# Patient Record
Sex: Male | Born: 1960 | Race: Black or African American | Hispanic: No | Smoking: Current every day smoker
Health system: Southern US, Community
[De-identification: ages and names within clinical notes are randomized; demographics above are authoritative.]

## PROBLEM LIST (undated history)

## (undated) DIAGNOSIS — F1490 Cocaine use, unspecified, uncomplicated: Secondary | ICD-10-CM

## (undated) DIAGNOSIS — G4733 Obstructive sleep apnea (adult) (pediatric): Secondary | ICD-10-CM

## (undated) DIAGNOSIS — I2510 Atherosclerotic heart disease of native coronary artery without angina pectoris: Secondary | ICD-10-CM

## (undated) DIAGNOSIS — E7800 Pure hypercholesterolemia, unspecified: Secondary | ICD-10-CM

## (undated) DIAGNOSIS — R600 Localized edema: Secondary | ICD-10-CM

## (undated) DIAGNOSIS — R0789 Other chest pain: Secondary | ICD-10-CM

## (undated) DIAGNOSIS — R001 Bradycardia, unspecified: Secondary | ICD-10-CM

## (undated) DIAGNOSIS — Z9981 Dependence on supplemental oxygen: Secondary | ICD-10-CM

## (undated) DIAGNOSIS — Z720 Tobacco use: Secondary | ICD-10-CM

## (undated) DIAGNOSIS — E785 Hyperlipidemia, unspecified: Secondary | ICD-10-CM

## (undated) DIAGNOSIS — G629 Polyneuropathy, unspecified: Secondary | ICD-10-CM

## (undated) DIAGNOSIS — R825 Elevated urine levels of drugs, medicaments and biological substances: Secondary | ICD-10-CM

## (undated) DIAGNOSIS — R1013 Epigastric pain: Secondary | ICD-10-CM

## (undated) DIAGNOSIS — J209 Acute bronchitis, unspecified: Secondary | ICD-10-CM

## (undated) DIAGNOSIS — I43 Cardiomyopathy in diseases classified elsewhere: Secondary | ICD-10-CM

## (undated) DIAGNOSIS — I10 Essential (primary) hypertension: Secondary | ICD-10-CM

## (undated) DIAGNOSIS — I5032 Chronic diastolic (congestive) heart failure: Secondary | ICD-10-CM

## (undated) DIAGNOSIS — Z9289 Personal history of other medical treatment: Secondary | ICD-10-CM

## (undated) DIAGNOSIS — M1990 Unspecified osteoarthritis, unspecified site: Secondary | ICD-10-CM

## (undated) DIAGNOSIS — B349 Viral infection, unspecified: Secondary | ICD-10-CM

## (undated) DIAGNOSIS — E854 Organ-limited amyloidosis: Secondary | ICD-10-CM

## (undated) DIAGNOSIS — I2699 Other pulmonary embolism without acute cor pulmonale: Secondary | ICD-10-CM

## (undated) DIAGNOSIS — D66 Hereditary factor VIII deficiency: Secondary | ICD-10-CM

## (undated) HISTORY — DX: Atherosclerotic heart disease of native coronary artery without angina pectoris: I25.10

## (undated) HISTORY — DX: Chronic diastolic (congestive) heart failure: I50.32

## (undated) HISTORY — DX: Tobacco use: Z72.0

## (undated) HISTORY — PX: SHOULDER SURGERY: SHX246

## (undated) HISTORY — DX: Organ-limited amyloidosis: I43

## (undated) HISTORY — PX: TRANSURETHRAL RESECTION OF PROSTATE: SHX73

## (undated) HISTORY — DX: Organ-limited amyloidosis: E85.4

## (undated) HISTORY — DX: Bradycardia, unspecified: R00.1

## (undated) HISTORY — DX: Cocaine use, unspecified, uncomplicated: F14.90

## (undated) HISTORY — DX: Localized edema: R60.0

---
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
## 2015-12-26 ENCOUNTER — Emergency Department (HOSPITAL_COMMUNITY): Payer: Medicaid Other

## 2015-12-26 ENCOUNTER — Encounter (HOSPITAL_COMMUNITY): Payer: Self-pay

## 2015-12-26 ENCOUNTER — Observation Stay (HOSPITAL_COMMUNITY)
Admission: EM | Admit: 2015-12-26 | Discharge: 2015-12-29 | Disposition: A | Discharge status: Home / Self Care | Payer: Medicaid Other | Attending: Family Medicine | Admitting: Family Medicine

## 2015-12-26 DIAGNOSIS — F17210 Nicotine dependence, cigarettes, uncomplicated: Secondary | ICD-10-CM | POA: Diagnosis not present

## 2015-12-26 DIAGNOSIS — R062 Wheezing: Secondary | ICD-10-CM | POA: Diagnosis not present

## 2015-12-26 DIAGNOSIS — R0789 Other chest pain: Secondary | ICD-10-CM | POA: Diagnosis not present

## 2015-12-26 DIAGNOSIS — B349 Viral infection, unspecified: Secondary | ICD-10-CM

## 2015-12-26 DIAGNOSIS — G629 Polyneuropathy, unspecified: Secondary | ICD-10-CM

## 2015-12-26 DIAGNOSIS — I10 Essential (primary) hypertension: Secondary | ICD-10-CM | POA: Diagnosis present

## 2015-12-26 DIAGNOSIS — J209 Acute bronchitis, unspecified: Secondary | ICD-10-CM

## 2015-12-26 HISTORY — DX: Essential (primary) hypertension: I10

## 2015-12-26 HISTORY — DX: Polyneuropathy, unspecified: G62.9

## 2015-12-26 HISTORY — DX: Other pulmonary embolism without acute cor pulmonale: I26.99

## 2015-12-26 LAB — CBC
HCT: 43.4 % (ref 39.0–52.0)
Hemoglobin: 15 g/dL (ref 13.0–17.0)
MCH: 29.3 pg (ref 26.0–34.0)
MCHC: 34.6 g/dL (ref 30.0–36.0)
MCV: 84.8 fL (ref 78.0–100.0)
PLATELETS: 195 10*3/uL (ref 150–400)
RBC: 5.12 MIL/uL (ref 4.22–5.81)
RDW: 14.3 % (ref 11.5–15.5)
WBC: 6.9 10*3/uL (ref 4.0–10.5)

## 2015-12-26 LAB — BASIC METABOLIC PANEL
Anion gap: 11 (ref 5–15)
BUN: 8 mg/dL (ref 6–20)
CHLORIDE: 100 mmol/L — AB (ref 101–111)
CO2: 25 mmol/L (ref 22–32)
CREATININE: 1.04 mg/dL (ref 0.61–1.24)
Calcium: 9.1 mg/dL (ref 8.9–10.3)
GFR calc Af Amer: >60 mL/min (ref >60)
GFR calc non Af Amer: >60 mL/min (ref >60)
Glucose, Bld: 101 mg/dL — ABNORMAL HIGH (ref 65–99)
Potassium: 3.3 mmol/L — ABNORMAL LOW (ref 3.5–5.1)
SODIUM: 136 mmol/L (ref 135–145)

## 2015-12-26 LAB — PROTIME-INR
INR: 1.02
PROTHROMBIN TIME: 13.4 s (ref 11.4–15.2)

## 2015-12-26 LAB — I-STAT TROPONIN, ED
Troponin i, poc: 0.03 ng/mL (ref 0.00–0.08)
Troponin i, poc: 0.03 ng/mL (ref 0.00–0.08)

## 2015-12-26 LAB — BRAIN NATRIURETIC PEPTIDE: B Natriuretic Peptide: 78.6 pg/mL (ref 0.0–100.0)

## 2015-12-26 MED ORDER — IPRATROPIUM-ALBUTEROL 0.5-2.5 (3) MG/3ML IN SOLN
3.0000 mL | Freq: Once | RESPIRATORY_TRACT | Status: AC
  Administered 2015-12-26: 3 mL via RESPIRATORY_TRACT
  Filled 2015-12-26: qty 3

## 2015-12-26 MED ORDER — ALBUTEROL SULFATE HFA 108 (90 BASE) MCG/ACT IN AERS: 2.0000 | INHALATION_SPRAY | RESPIRATORY_TRACT | 0 refills | Status: DC | PRN

## 2015-12-26 MED ORDER — DOXYCYCLINE HYCLATE 100 MG PO CAPS: 100.0000 mg | ORAL_CAPSULE | Freq: Two times a day (BID) | ORAL | 0 refills | Status: DC

## 2015-12-26 MED ORDER — METHYLPREDNISOLONE SODIUM SUCC 125 MG IJ SOLR
125.0000 mg | Freq: Once | INTRAMUSCULAR | Status: AC
  Administered 2015-12-26: 125 mg via INTRAVENOUS
  Filled 2015-12-26: qty 2

## 2015-12-26 NOTE — ED Provider Notes (Addendum)
[place] DEPT Provider Note  CSN: [id number] Arrival date & time: 12/26/15  1834   History   Chief Complaint Chief Complaint  Patient presents with  . Chest Pain  . Shortness of Breath   HPI Russell Collier is a 55 y.o. male.   Shortness of Breath  Associated symptoms include a fever, headaches, rhinorrhea, cough, sputum production, chest pain and vomiting. Pertinent negatives include no hemoptysis and no abdominal pain.  Illness  This is a new problem. The current episode started 2 days ago. The problem occurs constantly. The problem has been gradually worsening. Associated symptoms include chest pain, headaches and shortness of breath. Pertinent negatives include no abdominal pain.  Chest Pain   This is a new problem. The current episode started 2 days ago. The problem occurs constantly. The problem has not changed since onset.The pain is at a severity of 5/10. The pain is moderate. Associated symptoms include cough, diaphoresis, a fever, headaches, nausea, shortness of breath, sputum production and vomiting. Pertinent negatives include no abdominal pain, no exertional chest pressure, no hemoptysis and no irregular heartbeat.   Past Medical History:  Diagnosis Date  . Hypertension   . Neuropathy ([place])   . Pulmonary embolism ([facility])    There are no active problems to display for this patient.  Past Surgical History:  Procedure Laterality Date  . TRANSURETHRAL RESECTION OF PROSTATE       Home Medications    Prior to Admission medications   Not on File   Family History History reviewed. No pertinent family history.  Social History Social History  Substance Use Topics  . Smoking status: Current Every Day Smoker    Packs/day: 1.00    Types: Cigarettes  . Smokeless tobacco: Not on file  . Alcohol use Yes     Comment: "once in a while"   Allergies   Review of patient's allergies indicates no known allergies.   Review of Systems Review of Systems    Constitutional: Positive for chills, diaphoresis and fever.  HENT: Positive for congestion and rhinorrhea.   Respiratory: Positive for cough, sputum production, chest tightness and shortness of breath. Negative for hemoptysis.   Cardiovascular: Positive for chest pain.  Gastrointestinal: Positive for nausea and vomiting. Negative for abdominal pain, blood in stool, constipation and diarrhea.  Genitourinary: Negative for dysuria and flank pain.  Musculoskeletal: Positive for myalgias.  Neurological: Positive for headaches.  All other systems reviewed and are negative.    Physical Exam Updated Vital Signs BP 188/85   Pulse 81   Temp 98.8 F (37.1 C)   Resp (!) 28   Ht 5' 9.5" (1.765 m)   Wt 129.3 kg   SpO2 95%   BMI 41.48 kg/m   Physical Exam  Constitutional: He is oriented to person, place, and time. He appears well-developed and well-nourished. No distress.  HENT:  Head: Normocephalic.  Neck: No JVD present.  Cardiovascular: Normal rate.   Pulmonary/Chest: He is in respiratory distress. He has wheezes.  Abdominal: Soft. He exhibits no distension. There is no tenderness.  Musculoskeletal: Normal range of motion. He exhibits no edema.  No evidence of DVT  Neurological: He is alert and oriented to person, place, and time. No cranial nerve deficit. He exhibits normal muscle tone. Coordination normal.  Skin: Skin is warm. Capillary refill takes less than 2 seconds. He is not diaphoretic.  Psychiatric: His behavior is normal. Thought content normal.  Nursing note and vitals reviewed.  ED Treatments / Results  Labs (all  labs ordered are listed, but only abnormal results are displayed) Labs Reviewed  BASIC METABOLIC PANEL - Abnormal; Notable for the following:       Result Value   Potassium 3.3 (*)    Chloride 100 (*)    Glucose, Bld 101 (*)    All other components within normal limits  CBC  PROTIME-INR  BRAIN NATRIURETIC PEPTIDE  I-STAT TROPOININ, ED  I-STAT  TROPOININ, ED    EKG  EKG Interpretation  Date/Time:  Friday December 26 2015 18:44:26 EDT Ventricular Rate:  82 PR Interval:  192 QRS Duration: 124 QT Interval:  404 QTC Calculation: 472 R Axis:   -51 Text Interpretation:  Normal sinus rhythm Left anterior fascicular block Left ventricular hypertrophy with QRS widening Nonspecific T wave abnormality Abnormal ECG Confirmed by ZAVITZ MD, Vonna Kotyk [phone]) on 12/26/2015 9:23:35 PM      Radiology Dg Chest 2 View  Result Date: 12/26/2015 CLINICAL DATA:  Acute onset of left-sided chest pain and shortness of breath. Initial encounter. EXAM: CHEST  2 VIEW COMPARISON:  None. FINDINGS: The lungs are well-aerated. Mild peribronchial thickening is noted, with mildly increased interstitial markings. There is no evidence of focal opacification, pleural effusion or pneumothorax. The heart is normal in size; the mediastinal contour is within normal limits. No acute osseous abnormalities are seen. IMPRESSION: Mild peribronchial thickening, with mildly increased interstitial markings. Lungs otherwise grossly clear. Electronically Signed   By: Garald Balding M.D.   On: 12/26/2015 19:31   Procedures Procedures (including critical care time)  Medications Ordered in ED Medications - No data to display  Initial Impression / Assessment and Plan / ED Course  I have reviewed the triage vital signs and the nursing notes.  Pertinent labs & imaging results that were available during my care of the patient were reviewed by me and considered in my medical decision making (see chart for details).  Clinical Course   Patient is a 55 year old male with past medical history of hypertension, hyperlipidemia and diabetes as well as current smoker who presents to emergency department today with productive cough, chills, myalgias, worsening shortness of breath and atypical left-sided pinpoint chest pain that all started 2 days ago.  Patient initially described myalgias  starting which subsequently worsened to viral-like symptoms. Patient has no history of COPD or CHF. He has a history of wheezing and outdated albuterol. He currently smokes one pack per day.  Patient's chest pain is atypical and it is nonexertional. It is not independently associated with shortness of breath nausea. Believe it to be more musculoskeletal secondary to coughing.   Patient has diffuse bilateral wheezing that is secondary to possible viral illness given his myalgias, chills, nausea and vomiting.  Doubt patient has pulmonary embolism and find that to be less likely given his diffuse bilateral wheezing. DuoNeb was given as well as Solu-Medrol with significant improvement in aeration. Patient will be given albuterol treatment home. Given long smoking history, question if he has undiagnosed COPD. Will treat with short ABX course for possible COPD exacerbation.  Patient's story is extremely less likely to be ACS. He has non specific t wave abnormalities on EKG. He had a negative stress test this year per him. He also has a negative normal NM perfusion scan. Delta troponins WNL. No evidence of fluid overload on exam, negative BNP.   Patient discharged home in good health and will follow up with PCP as needed.   At time of discharge, patient became diaphoretic with worsening SOB. EKG shows  inferior t wave changes with increased ST elevation in V2. Denies chest pain.   Given new EKG changes, will give ASA and keep for delta troponins, continued wheezing and symptomatic care. Patient has significant risk factors and pending delta troponins, may benefit from stress test.   Final Clinical Impressions(s) / ED Diagnoses   Final diagnoses:  Wheezing  Viral illness  Atypical chest pain   New Prescriptions New Prescriptions   ALBUTEROL (PROVENTIL HFA;VENTOLIN HFA) 108 (90 BASE) MCG/ACT INHALER    Inhale 2 puffs into the lungs every 4 (four) hours as needed for wheezing or shortness of breath.        Roberto Scales, MD 12/27/15 [id number]    Elnora Morrison, MD 01/02/16 1106

## 2015-12-26 NOTE — ED Triage Notes (Addendum)
Pt from home with c/o left sided nonradiating chest pain, in triage pt noted to be sob, coughing up clear sputum and 1 episode of vomiting yellowish vomit. After episode of coughing/vomting pt can now speak in complete sentences. Expiratory wheezing and rales noted in all fields. Pt has hx of pulmonary embolus. Pt alert and oriented x 4. Pt denies hx of chf/copd but is a smoker.

## 2015-12-26 NOTE — ED Notes (Signed)
RN called lab to add on BNP

## 2015-12-27 ENCOUNTER — Encounter (HOSPITAL_COMMUNITY): Payer: Self-pay | Admitting: *Deleted

## 2015-12-27 DIAGNOSIS — R062 Wheezing: Secondary | ICD-10-CM

## 2015-12-27 DIAGNOSIS — R0789 Other chest pain: Secondary | ICD-10-CM | POA: Diagnosis present

## 2015-12-27 DIAGNOSIS — I10 Essential (primary) hypertension: Secondary | ICD-10-CM

## 2015-12-27 DIAGNOSIS — J209 Acute bronchitis, unspecified: Secondary | ICD-10-CM | POA: Diagnosis not present

## 2015-12-27 DIAGNOSIS — F17210 Nicotine dependence, cigarettes, uncomplicated: Secondary | ICD-10-CM

## 2015-12-27 DIAGNOSIS — G629 Polyneuropathy, unspecified: Secondary | ICD-10-CM | POA: Diagnosis not present

## 2015-12-27 DIAGNOSIS — B349 Viral infection, unspecified: Secondary | ICD-10-CM | POA: Insufficient documentation

## 2015-12-27 HISTORY — DX: Other chest pain: R07.89

## 2015-12-27 LAB — CBC
HCT: 43.7 % (ref 39.0–52.0)
HEMOGLOBIN: 14.9 g/dL (ref 13.0–17.0)
MCH: 29 pg (ref 26.0–34.0)
MCHC: 34.1 g/dL (ref 30.0–36.0)
MCV: 85.2 fL (ref 78.0–100.0)
PLATELETS: 220 10*3/uL (ref 150–400)
RBC: 5.13 MIL/uL (ref 4.22–5.81)
RDW: 14.3 % (ref 11.5–15.5)
WBC: 5.3 10*3/uL (ref 4.0–10.5)

## 2015-12-27 LAB — BASIC METABOLIC PANEL
ANION GAP: 9 (ref 5–15)
BUN: 9 mg/dL (ref 6–20)
CALCIUM: 9 mg/dL (ref 8.9–10.3)
CO2: 24 mmol/L (ref 22–32)
CREATININE: 0.93 mg/dL (ref 0.61–1.24)
Chloride: 103 mmol/L (ref 101–111)
Glucose, Bld: 181 mg/dL — ABNORMAL HIGH (ref 65–99)
Potassium: 3.8 mmol/L (ref 3.5–5.1)
SODIUM: 136 mmol/L (ref 135–145)

## 2015-12-27 LAB — INFLUENZA PANEL BY PCR (TYPE A & B)
INFLAPCR: NEGATIVE
INFLBPCR: NEGATIVE

## 2015-12-27 LAB — I-STAT TROPONIN, ED: Troponin i, poc: 0.01 ng/mL (ref 0.00–0.08)

## 2015-12-27 LAB — TROPONIN I

## 2015-12-27 LAB — TSH: TSH: 0.38 u[IU]/mL (ref 0.350–4.500)

## 2015-12-27 MED ORDER — IPRATROPIUM-ALBUTEROL 0.5-2.5 (3) MG/3ML IN SOLN
3.0000 mL | Freq: Three times a day (TID) | RESPIRATORY_TRACT | Status: DC
  Administered 2015-12-27 – 2015-12-29 (×6): 3 mL via RESPIRATORY_TRACT
  Filled 2015-12-27 (×6): qty 3

## 2015-12-27 MED ORDER — ACETAMINOPHEN 325 MG PO TABS
650.0000 mg | ORAL_TABLET | Freq: Four times a day (QID) | ORAL | Status: DC | PRN
  Administered 2015-12-27 – 2015-12-28 (×2): 650 mg via ORAL
  Filled 2015-12-27 (×2): qty 2

## 2015-12-27 MED ORDER — IPRATROPIUM-ALBUTEROL 0.5-2.5 (3) MG/3ML IN SOLN
3.0000 mL | Freq: Four times a day (QID) | RESPIRATORY_TRACT | Status: DC
  Administered 2015-12-27 (×3): 3 mL via RESPIRATORY_TRACT
  Filled 2015-12-27 (×3): qty 3

## 2015-12-27 MED ORDER — ENOXAPARIN SODIUM 80 MG/0.8ML ~~LOC~~ SOLN
65.0000 mg | SUBCUTANEOUS | Status: DC
  Administered 2015-12-27 – 2015-12-29 (×3): 65 mg via SUBCUTANEOUS
  Filled 2015-12-27 (×4): qty 0.8

## 2015-12-27 MED ORDER — GABAPENTIN 300 MG PO CAPS
300.0000 mg | ORAL_CAPSULE | Freq: Three times a day (TID) | ORAL | Status: DC
  Administered 2015-12-27 – 2015-12-29 (×8): 300 mg via ORAL
  Filled 2015-12-27 (×8): qty 1

## 2015-12-27 MED ORDER — POTASSIUM CHLORIDE CRYS ER 20 MEQ PO TBCR
40.0000 meq | EXTENDED_RELEASE_TABLET | Freq: Once | ORAL | Status: AC
  Administered 2015-12-27: 40 meq via ORAL
  Filled 2015-12-27: qty 2

## 2015-12-27 MED ORDER — SODIUM CHLORIDE 0.9% FLUSH
3.0000 mL | Freq: Two times a day (BID) | INTRAVENOUS | Status: DC
  Administered 2015-12-27 – 2015-12-29 (×6): 3 mL via INTRAVENOUS

## 2015-12-27 MED ORDER — ASPIRIN 81 MG PO CHEW
324.0000 mg | CHEWABLE_TABLET | Freq: Once | ORAL | Status: AC
  Administered 2015-12-27: 324 mg via ORAL
  Filled 2015-12-27: qty 4

## 2015-12-27 MED ORDER — ENALAPRIL MALEATE 20 MG PO TABS
20.0000 mg | ORAL_TABLET | Freq: Two times a day (BID) | ORAL | Status: DC
  Administered 2015-12-27 – 2015-12-29 (×6): 20 mg via ORAL
  Filled 2015-12-27: qty 1
  Filled 2015-12-27: qty 4
  Filled 2015-12-27: qty 1
  Filled 2015-12-27: qty 4
  Filled 2015-12-27: qty 1
  Filled 2015-12-27: qty 4
  Filled 2015-12-27 (×4): qty 1

## 2015-12-27 MED ORDER — CARVEDILOL 3.125 MG PO TABS
3.1250 mg | ORAL_TABLET | Freq: Two times a day (BID) | ORAL | Status: DC
Start: 2015-12-27 — End: 2015-12-28
  Administered 2015-12-27 (×2): 3.125 mg via ORAL
  Filled 2015-12-27 (×2): qty 1

## 2015-12-27 MED ORDER — PREDNISONE 20 MG PO TABS
20.0000 mg | ORAL_TABLET | Freq: Every day | ORAL | Status: DC
  Administered 2015-12-27 – 2015-12-28 (×2): 20 mg via ORAL
  Filled 2015-12-27 (×2): qty 1

## 2015-12-27 MED ORDER — NICOTINE 14 MG/24HR TD PT24
14.0000 mg | MEDICATED_PATCH | Freq: Every day | TRANSDERMAL | Status: DC
  Administered 2015-12-27 – 2015-12-29 (×3): 14 mg via TRANSDERMAL
  Filled 2015-12-27 (×3): qty 1

## 2015-12-27 NOTE — Progress Notes (Signed)
CPAP setup at bedside. Patient states that he will place it on when ready. Mask fitted to patient and tested. RT will monitor as needed.

## 2015-12-27 NOTE — Progress Notes (Signed)
H&P written earlier today by Dr. Daryll Drown. Reviewed and agree with assessment and plan. Appears primary issues is COPD exacerbation/bronchitis. Continue current management.

## 2015-12-27 NOTE — Progress Notes (Signed)
Pt states he used to wear CPAP at home, but hasn't worn in about 2 years. Per pt, he would like to have CPAP to try while he is admitted. RN notified to see about getting CPAP order. RT will continue to monitor.
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
## 2015-12-27 NOTE — Consult Note (Signed)
Admit date: 12/26/2015 Referring Physician  Dr. Lonny Prude Primary Physician No primary care provider on file. Primary Cardiologist  Dr. Claudie Leach [facility])-seen in 2016 Reason for Consultation  chest pain  HPI: 55 year old male with history of diabetes, hypertension, hyperlipidemia and GERD who presented with myalgias and cough vomiting chills and wheezing for proximally 3 days. During cough, he was experiencing some left-sided chest discomfort that was fleeting and did not seem to radiate. This pain was sharp. He is short of breath and appears to have bronchitis. Occasionally he may wake up sweating during this time. As well. Increased phlegm, emesis.  He smokes for about 50 years, admitting he started in the third grade. He is also had pulmonary function studies but have never been positive he states for COPD. No syncope, no orthopnea, no PND.  Currently chest pain-free.  While in the emergency department he had an episode of diaphoresis when sleeping and an EKG was performed at this time which did show some changes in T waves in the inferior leads. Previously he had nonspecific ST-T wave changes with LVH/left anterior fascicular block. Did not have any chest discomfort during these T-wave changes. They resolved.  About a year ago he was admitted at [facility] for an episode that included chest pain and underwent a nuclear stress test.  Nuclear stress test was performed at [facility] on 01/24/2015 less than 1 year ago and showed no ischemia, no infarction, normal ejection fraction of 51%, low risk stress test.  PMH:   Past Medical History:  Diagnosis Date  . Hypertension   . Neuropathy ([place])   . Pulmonary embolism ([facility])     PSH:   Past Surgical History:  Procedure Laterality Date  . TRANSURETHRAL RESECTION OF PROSTATE     Allergies:  Review of patient's allergies indicates no known allergies. Prior to Admit Meds:   Prior to Admission medications   Medication Sig  Start Date End Date Taking? Authorizing Provider  carvedilol (COREG) 3.125 MG tablet Take 3.125 mg by mouth 2 (two) times daily with a meal.   Yes Historical Provider, MD  enalapril (VASOTEC) 20 MG tablet Take 20 mg by mouth 2 (two) times daily.   Yes Historical Provider, MD  gabapentin (NEURONTIN) 300 MG capsule Take 300 mg by mouth 3 (three) times daily.   Yes Historical Provider, MD  albuterol (PROVENTIL HFA;VENTOLIN HFA) 108 (90 Base) MCG/ACT inhaler Inhale 2 puffs into the lungs every 4 (four) hours as needed for wheezing or shortness of breath. 12/26/15   Roberto Scales, MD  doxycycline (VIBRAMYCIN) 100 MG capsule Take 1 capsule (100 mg total) by mouth 2 (two) times daily. 12/26/15   Roberto Scales, MD   Fam HX:   History reviewed. No pertinent family history. Social HX:    Social History   Social History  . Marital status: Divorced    Spouse name: N/A  . Number of children: N/A  . Years of education: N/A   Occupational History  . Not on file.   Social History Main Topics  . Smoking status: Current Every Day Smoker    Packs/day: 1.00    Types: Cigarettes  . Smokeless tobacco: Never Used  . Alcohol use Yes     Comment: "once in a while"  . Drug use: No  . Sexual activity: Not on file   Other Topics Concern  . Not on file   Social History Narrative  . No narrative on file  ROS:  All 11 ROS were addressed and are negative except what is stated in the HPI   Physical Exam: Blood pressure (!) 148/73, pulse (P) 73, temperature (P) 98.9 F (37.2 C), temperature source (P) Oral, resp. rate (P) 12, height 5\' 9"  (1.753 m), weight 282 lb 6.4 oz (128.1 kg), SpO2 (P) 92 %.   General: Well developed, well nourished, in no acute distress Head: Eyes PERRLA, No xanthomas.   Normal cephalic and atramatic  Lungs:   Active wheezing heard bilaterally, slightly increased respiratory rate.Marland Kitchen Heart:   HRRR S1 S2 Pulses are 2+ & equal. No murmur, rubs, gallops.  No carotid bruit. No JVD.   No abdominal bruits.  Abdomen: Bowel sounds are positive, abdomen soft and non-tender without masses. No hepatosplenomegaly. Obese Msk:  Back normal. Normal strength and tone for age. Extremities:  No clubbing, cyanosis or edema.  DP +1 Neuro: Alert and oriented X 3, non-focal, MAE x 4 GU: Deferred Rectal: Deferred Psych:  Good affect, responds appropriately      Labs: Lab Results  Component Value Date   WBC 5.3 12/27/2015   HGB 14.9 12/27/2015   HCT 43.7 12/27/2015   MCV 85.2 12/27/2015   PLT 220 12/27/2015     Recent Labs Lab 12/26/15 1903  NA 136  K 3.3*  CL 100*  CO2 25  BUN 8  CREATININE 1.04  CALCIUM 9.1  GLUCOSE 101*    Recent Labs  12/27/15 0221  TROPONINI <0.03   No results found for: CHOL, HDL, LDLCALC, TRIG No results found for: DDIMER   Radiology:  Dg Chest 2 View  Result Date: 12/26/2015 CLINICAL DATA:  Acute onset of left-sided chest pain and shortness of breath. Initial encounter. EXAM: CHEST  2 VIEW COMPARISON:  None. FINDINGS: The lungs are well-aerated. Mild peribronchial thickening is noted, with mildly increased interstitial markings. There is no evidence of focal opacification, pleural effusion or pneumothorax. The heart is normal in size; the mediastinal contour is within normal limits. No acute osseous abnormalities are seen. IMPRESSION: Mild peribronchial thickening, with mildly increased interstitial markings. Lungs otherwise grossly clear. Electronically Signed   By: Garald Balding M.D.   On: 12/26/2015 19:31   Personally viewed.  EKG:  Subtle dynamic T-wave change noted in the inferior leads with nonspecific ST-T wave changes diffusely, LVH, left anterior fascicular block, sinus rhythm. Multiple EKGs reviewed Personally viewed.   ASSESSMENT/PLAN:    55 year old male with atypical pleuritic-like chest pain with coughing in the setting of presumed viral bronchitis with extensive smoking history, diabetes, hypertension, hyperlipidemia with  recent nuclear stress test low risk, no ischemia less than 1 year ago at [facility].  Atypical chest pain  - I agree with admitting physician Dr. Gilles Chiquito that his chest discomfort appears quite atypical although he has several risk factors for underlying coronary artery disease. Thankfully, he had a recent thorough cardiac evaluation less than 1 year ago including nuclear stress test that did not show any evidence of ischemia or perfusion defects and did demonstrate normal ejection fraction. Based upon this recent evaluation, I do not think that we need to pursue any further cardiac testing. Subtle dynamic T-wave changes may be enhancement of nonspecific ST T-wave changes at baseline or perhaps changes with respiratory pattern. He did not have chest discomfort during this episode. His diaphoresis may be secondary to underlying viral illness.  - If symptoms become more worrisome, further cardiac testing may be warranted. At this point, no further testing. I  will write for diet.  Tobacco use  - He has been smoking since the third grade, 50 pack years. Definitely encourage tobacco cessation.  Hypertension Clearly needs better control at home. He states that he has been taking his medications. Perhaps steroids are increasing. Encourage continued medication titration.  consider increasing carvedilol. If hypertension remains an issue, consider spironolactone. Replete potassium.   Obesity  - Encourage weight loss.  Bronchitis  - Per Main team. Still having active wheezing bilaterally  Candee Furbish, MD  12/27/2015  8:19 AM

## 2015-12-27 NOTE — ED Notes (Signed)
RN entered room to discharge patient; RN found patient to have beads of sweat on top of head; pt reporting feeling warm; pt denies CP or other symptoms; informed Gaddy MD Resident and new EKG and temp obtained

## 2015-12-28 ENCOUNTER — Encounter (HOSPITAL_COMMUNITY): Payer: Self-pay | Admitting: *Deleted

## 2015-12-28 DIAGNOSIS — R0789 Other chest pain: Secondary | ICD-10-CM | POA: Diagnosis not present

## 2015-12-28 DIAGNOSIS — J209 Acute bronchitis, unspecified: Secondary | ICD-10-CM

## 2015-12-28 DIAGNOSIS — I10 Essential (primary) hypertension: Secondary | ICD-10-CM | POA: Diagnosis not present

## 2015-12-28 DIAGNOSIS — G629 Polyneuropathy, unspecified: Secondary | ICD-10-CM | POA: Diagnosis not present

## 2015-12-28 HISTORY — DX: Acute bronchitis, unspecified: J20.9

## 2015-12-28 MED ORDER — CARVEDILOL 6.25 MG PO TABS
6.2500 mg | ORAL_TABLET | Freq: Two times a day (BID) | ORAL | Status: DC
  Administered 2015-12-28 – 2015-12-29 (×4): 6.25 mg via ORAL
  Filled 2015-12-28 (×4): qty 1

## 2015-12-28 MED ORDER — AZITHROMYCIN 250 MG PO TABS
500.0000 mg | ORAL_TABLET | Freq: Every day | ORAL | Status: AC
  Administered 2015-12-28: 500 mg via ORAL
  Filled 2015-12-28: qty 2

## 2015-12-28 MED ORDER — PREDNISONE 20 MG PO TABS
40.0000 mg | ORAL_TABLET | Freq: Every day | ORAL | Status: DC
  Administered 2015-12-29: 40 mg via ORAL
  Filled 2015-12-28: qty 2

## 2015-12-28 MED ORDER — PREDNISONE 20 MG PO TABS
20.0000 mg | ORAL_TABLET | Freq: Once | ORAL | Status: AC
  Administered 2015-12-28: 20 mg via ORAL
  Filled 2015-12-28: qty 1

## 2015-12-28 MED ORDER — AZITHROMYCIN 250 MG PO TABS
250.0000 mg | ORAL_TABLET | Freq: Every day | ORAL | Status: DC
  Administered 2015-12-29: 250 mg via ORAL
  Filled 2015-12-28: qty 1

## 2015-12-28 MED ORDER — PREDNISONE 20 MG PO TABS: 20.0000 mg | ORAL_TABLET | Freq: Every day | ORAL | Status: DC

## 2015-12-28 MED ORDER — GUAIFENESIN ER 600 MG PO TB12
1200.0000 mg | ORAL_TABLET | Freq: Two times a day (BID) | ORAL | Status: DC
  Administered 2015-12-28 – 2015-12-29 (×2): 1200 mg via ORAL
  Filled 2015-12-28 (×2): qty 2

## 2015-12-28 NOTE — Progress Notes (Signed)
Patient had a coughing spell during the night and c/o afterwards that "my lungs hurt". The pain eventually went away per patient.

## 2015-12-28 NOTE — Progress Notes (Signed)
PROGRESS NOTE    Russell Collier  [id number] DOB: [birth year] DOA: 12/26/2015 PCP: No primary care provider on file.   Brief Narrative: Russell Collier is a 55 y.o. male with medical history significant of HTN, HLD, DM (untreated), BPH, GERD. He presented with myalgias, cough, nausea/vomiting, chills, wheezing and is being treated for a COPD exacerbation.   Assessment & Plan:   Principal Problem:   Atypical chest pain Active Problems:   Essential hypertension   Neuropathy (HCC)   Acute bronchitis   Chest pain Resolved. Atypical. Troponin negative. ACS ruled out. Low risk stress test 01/24/2015. Cardiology signed off  Bronchitis Suspect COPD as he has pretty significant symptoms for this with a history of smoking. Appears he has been on bronchodilators in the past. He follows up with pulmonology regularly. Significant wheezing with sputum production. Flu negative. -increase to prednisone 40mg  daily -continue DuoNeb -guaifenesin BID  Hypertension Uncontrolled -increase to Coreg 6.25mg  BID -continue enalapril  Neuropathy -continue gabapentin   DVT prophylaxis: Lovenox Code Status: Observation Family Communication: None at bedside Disposition Plan: Anticipate discharge tomorrow   Consultants:   None  Procedures:   None  Antimicrobials:  Azithromycin (11/5>>    Subjective: Patient reports significant coughing overnight with a lot of phlegm production. Wheezing improved.  Objective: Vitals:   12/27/15 2120 12/27/15 2124 12/28/15 0528 12/28/15 0530  BP: (!) 185/82 (!) 186/86 (!) 162/102 (!) 170/96  Pulse: 74  89   Resp: 17  16   Temp: 98.6 F (37 C)  98.1 F (36.7 C)   TempSrc: Oral  Oral   SpO2: 96%  98%   Weight:   127.9 kg (281 lb 14.4 oz)   Height:        Intake/Output Summary (Last 24 hours) at 12/28/15 0934 Last data filed at 12/27/15 1911  Gross per 24 hour  Intake              485 ml  Output              600 ml  Net             -115 ml     Filed Weights   12/27/15 0448 12/27/15 1601 12/28/15 0528  Weight: 128.1 kg (282 lb 6.4 oz) 128.3 kg (282 lb 14.4 oz) 127.9 kg (281 lb 14.4 oz)    Examination:  General exam: Appears calm and comfortable  Respiratory system: Diffuse wheezing with decreased breath sounds and prolonged expiratory phase. Respiratory effort normal. Cardiovascular system: S1 & S2 heard, RRR. No murmurs, rubs, gallops or clicks. Gastrointestinal system: Abdomen is nondistended, soft and nontender. Normal bowel sounds heard. Central nervous system: Alert and oriented. No focal neurological deficits. Extremities: No edema. No calf tenderness Skin: No cyanosis. No rashes Psychiatry: Judgement and insight appear normal. Mood & affect appropriate.     Data Reviewed: I have personally reviewed following labs and imaging studies  CBC:  Recent Labs Lab 12/26/15 1903 12/27/15 0715  WBC 6.9 5.3  HGB 15.0 14.9  HCT 43.4 43.7  MCV 84.8 85.2  PLT 195 [id number]   Basic Metabolic Panel:  Recent Labs Lab 12/26/15 1903 12/27/15 0715  NA 136 136  K 3.3* 3.8  CL 100* 103  CO2 25 24  GLUCOSE 101* 181*  BUN 8 9  CREATININE 1.04 0.93  CALCIUM 9.1 9.0   GFR: Estimated Creatinine Clearance: 116 mL/min (by C-G formula based on SCr of 0.93 mg/dL). Liver Function Tests: No results for input(s): AST, ALT, ALKPHOS,  BILITOT, PROT, ALBUMIN in the last 168 hours. No results for input(s): LIPASE, AMYLASE in the last 168 hours. No results for input(s): AMMONIA in the last 168 hours. Coagulation Profile:  Recent Labs Lab 12/26/15 1903  INR 1.02   Cardiac Enzymes:  Recent Labs Lab 12/27/15 0221 12/27/15 0715  TROPONINI <0.03 <0.03   BNP (last 3 results) No results for input(s): PROBNP in the last 8760 hours. HbA1C: No results for input(s): HGBA1C in the last 72 hours. CBG: No results for input(s): GLUCAP in the last 168 hours. Lipid Profile: No results for input(s): CHOL, HDL, LDLCALC, TRIG, CHOLHDL,  LDLDIRECT in the last 72 hours. Thyroid Function Tests:  Recent Labs  12/27/15 0221  TSH 0.380   Anemia Panel: No results for input(s): VITAMINB12, FOLATE, FERRITIN, TIBC, IRON, RETICCTPCT in the last 72 hours. Sepsis Labs: No results for input(s): PROCALCITON, LATICACIDVEN in the last 168 hours.  No results found for this or any previous visit (from the past 240 hour(s)).       Radiology Studies: Dg Chest 2 View  Result Date: 12/26/2015 CLINICAL DATA:  Acute onset of left-sided chest pain and shortness of breath. Initial encounter. EXAM: CHEST  2 VIEW COMPARISON:  None. FINDINGS: The lungs are well-aerated. Mild peribronchial thickening is noted, with mildly increased interstitial markings. There is no evidence of focal opacification, pleural effusion or pneumothorax. The heart is normal in size; the mediastinal contour is within normal limits. No acute osseous abnormalities are seen. IMPRESSION: Mild peribronchial thickening, with mildly increased interstitial markings. Lungs otherwise grossly clear. Electronically Signed   By: Garald Balding M.D.   On: 12/26/2015 19:31        Scheduled Meds: . carvedilol  6.25 mg Oral BID WC  . enalapril  20 mg Oral BID  . enoxaparin (LOVENOX) injection  65 mg Subcutaneous Q24H  . gabapentin  300 mg Oral TID  . guaiFENesin  1,200 mg Oral BID  . ipratropium-albuterol  3 mL Nebulization TID  . nicotine  14 mg Transdermal Daily  . predniSONE  20 mg Oral Once  . [START ON 12/29/2015] predniSONE  40 mg Oral Q breakfast  . sodium chloride flush  3 mL Intravenous Q12H   Continuous Infusions:   LOS: 0 days     Cordelia Poche [facility] 12/28/2015, 9:34 AM Pager: [phone]  If 7PM-7AM, please contact night-coverage [email or web address] Password [facility] 12/28/2015, 9:34 AM

## 2015-12-28 NOTE — Progress Notes (Signed)
Patient states that he will place himself on CPAP for the night. RT will continue to monitor as needed.

## 2015-12-29 DIAGNOSIS — G629 Polyneuropathy, unspecified: Secondary | ICD-10-CM | POA: Diagnosis not present

## 2015-12-29 DIAGNOSIS — J209 Acute bronchitis, unspecified: Secondary | ICD-10-CM | POA: Diagnosis not present

## 2015-12-29 DIAGNOSIS — I10 Essential (primary) hypertension: Secondary | ICD-10-CM | POA: Diagnosis not present

## 2015-12-29 DIAGNOSIS — R0789 Other chest pain: Secondary | ICD-10-CM | POA: Diagnosis not present

## 2015-12-29 MED ORDER — PREDNISONE 20 MG PO TABS: 40.0000 mg | ORAL_TABLET | Freq: Every day | ORAL | 0 refills | Status: AC

## 2015-12-29 MED ORDER — CARVEDILOL 6.25 MG PO TABS: 6.2500 mg | ORAL_TABLET | Freq: Two times a day (BID) | ORAL | 0 refills | Status: DC

## 2015-12-29 MED ORDER — IPRATROPIUM-ALBUTEROL 0.5-2.5 (3) MG/3ML IN SOLN: 3.0000 mL | Freq: Three times a day (TID) | RESPIRATORY_TRACT | 0 refills | Status: DC

## 2015-12-29 MED ORDER — AZITHROMYCIN 250 MG PO TABS: 250.0000 mg | ORAL_TABLET | Freq: Every day | ORAL | 0 refills | Status: AC

## 2015-12-29 MED ORDER — GUAIFENESIN ER 600 MG PO TB12: 600.0000 mg | ORAL_TABLET | Freq: Two times a day (BID) | ORAL | 0 refills | Status: DC

## 2015-12-29 MED ORDER — AMLODIPINE BESYLATE 5 MG PO TABS
5.0000 mg | ORAL_TABLET | Freq: Every day | ORAL | Status: DC
  Administered 2015-12-29: 5 mg via ORAL
  Filled 2015-12-29: qty 1

## 2015-12-29 MED ORDER — AMLODIPINE BESYLATE 5 MG PO TABS: 5.0000 mg | ORAL_TABLET | Freq: Every day | ORAL | 0 refills | Status: DC

## 2015-12-29 MED ORDER — HYDRALAZINE HCL 20 MG/ML IJ SOLN
10.0000 mg | Freq: Once | INTRAMUSCULAR | Status: AC
  Administered 2015-12-29: 10 mg via INTRAVENOUS
  Filled 2015-12-29: qty 1

## 2015-12-29 NOTE — Progress Notes (Signed)
PROGRESS NOTE    Russell Collier  [id number] DOB: December 31, 1960 DOA: 12/26/2015 PCP: No primary care provider on file.   Brief Narrative: Russell Collier is a 55 y.o. male with medical history significant of HTN, HLD, DM (untreated), BPH, GERD. He presented with myalgias, cough, nausea/vomiting, chills, wheezing and is being treated for a COPD exacerbation.   Assessment & Plan:   Principal Problem:   Atypical chest pain Active Problems:   Essential hypertension   Neuropathy (HCC)   Acute bronchitis   Chest pain Resolved. Atypical. Troponin negative. ACS ruled out. Low risk stress test 01/24/2015. Cardiology signed off  Bronchitis Suspect COPD as he has pretty significant symptoms for this with a history of smoking. Appears he has been on bronchodilators in the past. He follows up with pulmonology regularly. Significant wheezing with sputum production. Flu negative. On room air. -continue prednisone 40mg  daily -continue DuoNeb -guaifenesin BID  Hypertension Uncontrolled -continue Coreg 6.25mg  BID -start amlodipine 5mg  daily -continue enalapril  Neuropathy -continue gabapentin   DVT prophylaxis: Lovenox Code Status: Observation Family Communication: None at bedside Disposition Plan: Anticipate discharge this afternoon   Consultants:   None  Procedures:   None  Antimicrobials:  Azithromycin (11/5>>   Subjective: Coughing somewhat improved. Still having sputum production.   Objective: Vitals:   12/29/15 0624 12/29/15 0844 12/29/15 0945 12/29/15 0947  BP: (!) 154/81  (!) 149/76   Pulse:    66  Resp:      Temp:      TempSrc:      SpO2:  96%    Weight:      Height:        Intake/Output Summary (Last 24 hours) at 12/29/15 1132 Last data filed at 12/29/15 0600  Gross per 24 hour  Intake              375 ml  Output                0 ml  Net              375 ml   Filed Weights   12/27/15 1601 12/28/15 0528 12/29/15 0442  Weight: 128.3 kg (282 lb  14.4 oz) 127.9 kg (281 lb 14.4 oz) 126.9 kg (279 lb 12.8 oz)    Examination:  General exam: Appears calm and comfortable  Respiratory system: Diffuse wheezing with improved breath sounds and prolonged expiratory phase. Respiratory effort normal. Cardiovascular system: S1 & S2 heard, RRR. No murmurs, rubs, gallops or clicks. Gastrointestinal system: Abdomen is nondistended, soft and nontender. Normal bowel sounds heard. Central nervous system: Alert and oriented. No focal neurological deficits. Extremities: No edema. No calf tenderness Skin: No cyanosis. No rashes Psychiatry: Judgement and insight appear normal. Mood & affect appropriate.     Data Reviewed: I have personally reviewed following labs and imaging studies  CBC:  Recent Labs Lab 12/26/15 1903 12/27/15 0715  WBC 6.9 5.3  HGB 15.0 14.9  HCT 43.4 43.7  MCV 84.8 85.2  PLT 195 [id number]   Basic Metabolic Panel:  Recent Labs Lab 12/26/15 1903 12/27/15 0715  NA 136 136  K 3.3* 3.8  CL 100* 103  CO2 25 24  GLUCOSE 101* 181*  BUN 8 9  CREATININE 1.04 0.93  CALCIUM 9.1 9.0   GFR: Estimated Creatinine Clearance: 115.5 mL/min (by C-G formula based on SCr of 0.93 mg/dL). Liver Function Tests: No results for input(s): AST, ALT, ALKPHOS, BILITOT, PROT, ALBUMIN in the last 168 hours. No results for  input(s): LIPASE, AMYLASE in the last 168 hours. No results for input(s): AMMONIA in the last 168 hours. Coagulation Profile:  Recent Labs Lab 12/26/15 1903  INR 1.02   Cardiac Enzymes:  Recent Labs Lab 12/27/15 0221 12/27/15 0715  TROPONINI <0.03 <0.03   BNP (last 3 results) No results for input(s): PROBNP in the last 8760 hours. HbA1C: No results for input(s): HGBA1C in the last 72 hours. CBG: No results for input(s): GLUCAP in the last 168 hours. Lipid Profile: No results for input(s): CHOL, HDL, LDLCALC, TRIG, CHOLHDL, LDLDIRECT in the last 72 hours. Thyroid Function Tests:  Recent Labs  12/27/15 0221    TSH 0.380   Anemia Panel: No results for input(s): VITAMINB12, FOLATE, FERRITIN, TIBC, IRON, RETICCTPCT in the last 72 hours. Sepsis Labs: No results for input(s): PROCALCITON, LATICACIDVEN in the last 168 hours.  No results found for this or any previous visit (from the past 240 hour(s)).       Radiology Studies: No results found.      Scheduled Meds: . amLODipine  5 mg Oral Daily  . azithromycin  250 mg Oral Daily  . carvedilol  6.25 mg Oral BID WC  . enalapril  20 mg Oral BID  . enoxaparin (LOVENOX) injection  65 mg Subcutaneous Q24H  . gabapentin  300 mg Oral TID  . guaiFENesin  1,200 mg Oral BID  . ipratropium-albuterol  3 mL Nebulization TID  . nicotine  14 mg Transdermal Daily  . predniSONE  40 mg Oral Q breakfast  . sodium chloride flush  3 mL Intravenous Q12H   Continuous Infusions:   LOS: 0 days     Cordelia Poche [facility] 12/29/2015, 11:32 AM Pager: [phone]  If 7PM-7AM, please contact night-coverage [email or web address] Password [email or web address] 12/29/2015, 11:32 AM

## 2015-12-29 NOTE — Care Management (Signed)
Pt admitted for Atypical Chest Pain. Plan for d/c home today. Pt will need Nebulizer at d/c. [facility] notified and will deliver Nebulizer to room before d/c. No further needs from CM at this time. Bethena Roys, RN,BSN [phone]

## 2015-12-29 NOTE — Clinical Social Work Note (Signed)
Transportation resources including Medicaid transportation information provided to the patient.  Liz Beach MSW, [place], [place], [phone]

## 2015-12-29 NOTE — Discharge Summary (Signed)
Physician Discharge Summary  Russell Collier [id number] DOB: 12-22-1960 DOA: 12/26/2015  PCP: No primary care provider on file.  Admit date: 12/26/2015 Discharge date: 12/29/2015  Admitted From: Home Disposition:  Home  Recommendations for Outpatient Follow-up:  1. Follow up with PCP in 1 week 2. Follow-up with pulmonologist in 1 week 3. Weight loss  Discharge Condition: Stable CODE STATUS: Full code Diet recommendation: Heart healthy   Brief/Interim Summary:  HPI written by Dr. Gilles Chiquito on 12/27/2015  HPI: Russell Collier is a 55 y.o. male with medical history significant of HTN, HLD, DM (untreated), BPH, GERD who presents with myalgias, cough, nausea, vomiting, chills, wheezing for about 3 days.  He reports further a twinge type of chest pain over his left chest that comes on after he is coughing.  It comes and goes very quickly, does not radiate and does not come on with activity.  He does not have SOB.  He has had a few episodes of waking up sweating during this time as well.  Other symptoms include increased phlegm production and emesis X 2.  He is a current smoker and reports smoking 1 ppd since 3rd grade, about 50 years.  He reports having PFTs many times and that they are never "positive."  He occasionally drinks ETOH.  He denies any abdominal pain, urinary issues, neurological changes, dizziness, passing out.    ED Course: In the ED, he was treated as a COPD exac/viral illness with duonebs and solumedrol.  He was improving, however, he had another episode of diaphoresis while sleeping and some EKG changes with TWI during this event.  He did not have any chest pain during it.  He had a CXR which showed some peribronchial thickening.    Hospital course:  Chest pain Resolved early during admission. Atypical. Troponin negative. ACS ruled out. Low risk stress test from 01/24/2015. Cardiology signed off  Bronchitis Suspect COPD as he has pretty significant symptoms for this  with a history of smoking, however, he states he has been getting PFTs and with no evidence of lung disease. Appears he has been on bronchodilators in the past. He follows up with pulmonology regularly. Patient started on prednisone, guaifenesin and DuoNebs. Significant wheezing with sputum production. Azithromycin started. Flu negative. Symptoms improved with treatment. Patient sent home on continued treatment. -increase to prednisone 40mg  daily  Hypertension Uncontrolled. Increased coreg to 6.25mg  and started amlodipine. Enalapril was continued. -increase to Coreg 6.25mg  BID  Neuropathy Continue gabapentin  Discharge Diagnoses:  Principal Problem:   Atypical chest pain Active Problems:   Essential hypertension   Neuropathy (HCC)   Acute bronchitis    Discharge Instructions     Medication List    TAKE these medications   albuterol 108 (90 Base) MCG/ACT inhaler Commonly known as:  PROVENTIL HFA;VENTOLIN HFA Inhale 2 puffs into the lungs every 4 (four) hours as needed for wheezing or shortness of breath.   amLODipine 5 MG tablet Commonly known as:  NORVASC Take 1 tablet (5 mg total) by mouth daily. Start taking on:  12/30/2015   azithromycin 250 MG tablet Commonly known as:  ZITHROMAX Take 1 tablet (250 mg total) by mouth daily. Start taking on:  12/30/2015   carvedilol 6.25 MG tablet Commonly known as:  COREG Take 1 tablet (6.25 mg total) by mouth 2 (two) times daily with a meal. What changed:  medication strength  how much to take   enalapril 20 MG tablet Commonly known as:  VASOTEC Take 20 mg  by mouth 2 (two) times daily.   gabapentin 300 MG capsule Commonly known as:  NEURONTIN Take 300 mg by mouth 3 (three) times daily.   guaiFENesin 600 MG 12 hr tablet Commonly known as:  MUCINEX Take 1 tablet (600 mg total) by mouth 2 (two) times daily.   ipratropium-albuterol 0.5-2.5 (3) MG/3ML Soln Commonly known as:  DUONEB Take 3 mLs by nebulization 3 (three)  times daily.   predniSONE 20 MG tablet Commonly known as:  DELTASONE Take 2 tablets (40 mg total) by mouth daily with breakfast. Start taking on:  12/30/2015      Follow-up Information    Schedule an appointment as soon as possible for a visit with Your PCP.   Why:  For follow up regarding your visit to the ED Contact information: Please follow up with your PCP       Go to [place].   Specialty:  Emergency Medicine Why:  If symptoms worsen Contact information: [address] [id number] [place] [phone]       Pulmonologist. Schedule an appointment as soon as possible for a visit in 1 week(s).          No Known Allergies  Consultations:  Cardiology   Procedures/Studies: Dg Chest 2 View  Result Date: 12/26/2015 CLINICAL DATA:  Acute onset of left-sided chest pain and shortness of breath. Initial encounter. EXAM: CHEST  2 VIEW COMPARISON:  None. FINDINGS: The lungs are well-aerated. Mild peribronchial thickening is noted, with mildly increased interstitial markings. There is no evidence of focal opacification, pleural effusion or pneumothorax. The heart is normal in size; the mediastinal contour is within normal limits. No acute osseous abnormalities are seen. IMPRESSION: Mild peribronchial thickening, with mildly increased interstitial markings. Lungs otherwise grossly clear. Electronically Signed   By: Russell Collier M.D.   On: 12/26/2015 19:31      Subjective: General exam: Appears calm and comfortable  Respiratory system: Diffuse wheezing with decreased breath sounds and prolonged expiratory phase. Respiratory effort normal. Cardiovascular system: S1 & S2 heard, RRR. No murmurs, rubs, gallops or clicks. Gastrointestinal system: Abdomen is nondistended, soft and nontender. Normal bowel sounds heard. Central nervous system: Alert and oriented. No focal neurological deficits. Extremities:  No edema. No calf tenderness Skin: No cyanosis. No rashes Psychiatry: Judgement and insight appear normal. Mood & affect appropriate.    Discharge Exam: Vitals:   12/29/15 0947 12/29/15 1434  BP:  (!) 159/76  Pulse: 66 78  Resp:    Temp:  98.3 F (36.8 C)   Vitals:   12/29/15 0947 12/29/15 1431 12/29/15 1434 12/29/15 1515  BP:   (!) 159/76   Pulse: 66  78   Resp:      Temp:   98.3 F (36.8 C)   TempSrc:   Oral   SpO2:  99% 97% 97%  Weight:      Height:        General exam: Appears calm and comfortable  Respiratory system: Diffuse wheezing with improved breath sounds and prolonged expiratory phase. Respiratory effort normal. Cardiovascular system: S1 & S2 heard, RRR. No murmurs, rubs, gallops or clicks. Gastrointestinal system: Abdomen is nondistended, soft and nontender. Normal bowel sounds heard. Central nervous system: Alert and oriented. No focal neurological deficits. Extremities: No edema. No calf tenderness Skin: No cyanosis. No rashes Psychiatry: Judgement and insight appear normal. Mood & affect appropriate.    The results of significant diagnostics from this hospitalization (including imaging, microbiology,  ancillary and laboratory) are listed below for reference.     Microbiology: No results found for this or any previous visit (from the past 240 hour(s)).   Labs: BNP (last 3 results)  Recent Labs  12/26/15 1903  BNP [id number]   Basic Metabolic Panel:  Recent Labs Lab 12/26/15 1903 12/27/15 0715  NA 136 136  K 3.3* 3.8  CL 100* 103  CO2 25 24  GLUCOSE 101* 181*  BUN 8 9  CREATININE 1.04 0.93  CALCIUM 9.1 9.0   Liver Function Tests: No results for input(s): AST, ALT, ALKPHOS, BILITOT, PROT, ALBUMIN in the last 168 hours. No results for input(s): LIPASE, AMYLASE in the last 168 hours. No results for input(s): AMMONIA in the last 168 hours. CBC:  Recent Labs Lab 12/26/15 1903 12/27/15 0715  WBC 6.9 5.3  HGB 15.0 14.9  HCT 43.4 43.7  MCV  84.8 85.2  PLT 195 220   Cardiac Enzymes:  Recent Labs Lab 12/27/15 0221 12/27/15 0715  TROPONINI <0.03 <0.03   BNP: Invalid input(s): POCBNP CBG: No results for input(s): GLUCAP in the last 168 hours. D-Dimer No results for input(s): DDIMER in the last 72 hours. Hgb A1c No results for input(s): HGBA1C in the last 72 hours. Lipid Profile No results for input(s): CHOL, HDL, LDLCALC, TRIG, CHOLHDL, LDLDIRECT in the last 72 hours. Thyroid function studies  Recent Labs  12/27/15 0221  TSH 0.380   Anemia work up No results for input(s): VITAMINB12, FOLATE, FERRITIN, TIBC, IRON, RETICCTPCT in the last 72 hours. Urinalysis No results found for: COLORURINE, APPEARANCEUR, LABSPEC, [place], GLUCOSEU, HGBUR, BILIRUBINUR, KETONESUR, PROTEINUR, UROBILINOGEN, NITRITE, LEUKOCYTESUR Sepsis Labs Invalid input(s): PROCALCITONIN,  WBC,  LACTICIDVEN Microbiology No results found for this or any previous visit (from the past 240 hour(s)).   Time coordinating discharge: Over 30 minutes  SIGNED:   Cordelia Poche, MD [facility] 12/29/2015, 4:02 PM Pager [phone]  If 7PM-7AM, please contact night-coverage [email or web address] Password [email or web address]

## 2015-12-29 NOTE — Discharge Instructions (Addendum)
[name], you were in the hospital because of your breathing. It appears that this is likely because of inflammation of your lungs. Please follow-up with your pulmonologist. Please continue the antibiotics, steroids and breathing treatments as prescribed. After three days, you can use the breathing treatment as needed for wheezing.     Hypertension Hypertension, commonly called high blood pressure, is when the force of blood pumping through your arteries is too strong. Your arteries are the blood vessels that carry blood from your heart throughout your body. A blood pressure reading consists of a higher number over a lower number, such as 110/72. The higher number (systolic) is the pressure inside your arteries when your heart pumps. The lower number (diastolic) is the pressure inside your arteries when your heart relaxes. Ideally you want your blood pressure below 120/80. Hypertension forces your heart to work harder to pump blood. Your arteries may become narrow or stiff. Having untreated or uncontrolled hypertension can cause heart attack, stroke, kidney disease, and other problems. RISK FACTORS Some risk factors for high blood pressure are controllable. Others are not.  Risk factors you cannot control include:   Race. You may be at higher risk if you are African American.  Age. Risk increases with age.  Gender. Men are at higher risk than women before age 38 years. After age 72, women are at higher risk than men. Risk factors you can control include:  Not getting enough exercise or physical activity.  Being overweight.  Getting too much fat, sugar, calories, or salt in your diet.  Drinking too much alcohol. SIGNS AND SYMPTOMS Hypertension does not usually cause signs or symptoms. Extremely high blood pressure (hypertensive crisis) may cause headache, anxiety, shortness of breath, and nosebleed. DIAGNOSIS To check if you have hypertension, your health care provider will measure your  blood pressure while you are seated, with your arm held at the level of your heart. It should be measured at least twice using the same arm. Certain conditions can cause a difference in blood pressure between your right and left arms. A blood pressure reading that is higher than normal on one occasion does not mean that you need treatment. If it is not clear whether you have high blood pressure, you may be asked to return on a different day to have your blood pressure checked again. Or, you may be asked to monitor your blood pressure at home for 1 or more weeks. TREATMENT Treating high blood pressure includes making lifestyle changes and possibly taking medicine. Living a healthy lifestyle can help lower high blood pressure. You may need to change some of your habits. Lifestyle changes may include:  Following the DASH diet. This diet is high in fruits, vegetables, and whole grains. It is low in salt, red meat, and added sugars.  Keep your sodium intake below 2,300 mg per day.  Getting at least 30-45 minutes of aerobic exercise at least 4 times per week.  Losing weight if necessary.  Not smoking.  Limiting alcoholic beverages.  Learning ways to reduce stress. Your health care provider may prescribe medicine if lifestyle changes are not enough to get your blood pressure under control, and if one of the following is true:  You are 62-23 years of age and your systolic blood pressure is above 140.  You are 44 years of age or older, and your systolic blood pressure is above 150.  Your diastolic blood pressure is above 90.  You have diabetes, and your systolic blood pressure  is over [id number] or your diastolic blood pressure is over 90.  You have kidney disease and your blood pressure is above 140/90.  You have heart disease and your blood pressure is above 140/90. Your personal target blood pressure may vary depending on your medical conditions, your age, and other factors. HOME CARE  INSTRUCTIONS  Have your blood pressure rechecked as directed by your health care provider.   Take medicines only as directed by your health care provider. Follow the directions carefully. Blood pressure medicines must be taken as prescribed. The medicine does not work as well when you skip doses. Skipping doses also puts you at risk for problems.  Do not smoke.   Monitor your blood pressure at home as directed by your health care provider. SEEK MEDICAL CARE IF:   You think you are having a reaction to medicines taken.  You have recurrent headaches or feel dizzy.  You have swelling in your ankles.  You have trouble with your vision. SEEK IMMEDIATE MEDICAL CARE IF:  You develop a severe headache or confusion.  You have unusual weakness, numbness, or feel faint.  You have severe chest or abdominal pain.  You vomit repeatedly.  You have trouble breathing. MAKE SURE YOU:   Understand these instructions.  Will watch your condition.  Will get help right away if you are not doing well or get worse.   This information is not intended to replace advice given to you by your health care provider. Make sure you discuss any questions you have with your health care provider.   Document Released: 02/08/2005 Document Revised: 06/25/2014 Document Reviewed: 12/01/2012 Elsevier Interactive Patient Education 2016 [employer]   Nonspecific Chest Pain  Chest pain can be caused by many different conditions. There is always a chance that your pain could be related to something serious, such as a heart attack or a blood clot in your lungs. Chest pain can also be caused by conditions that are not life-threatening. If you have chest pain, it is very important to follow up with your health care provider. CAUSES  Chest pain can be caused by:  Heartburn.  Pneumonia or bronchitis.  Anxiety or stress.  Inflammation around your heart (pericarditis) or lung (pleuritis or pleurisy).  A  blood clot in your lung.  A collapsed lung (pneumothorax). It can develop suddenly on its own (spontaneous pneumothorax) or from trauma to the chest.  Shingles infection (varicella-zoster virus).  Heart attack.  Damage to the bones, muscles, and cartilage that make up your chest wall. This can include:  Bruised bones due to injury.  Strained muscles or cartilage due to frequent or repeated coughing or overwork.  Fracture to one or more ribs.  Sore cartilage due to inflammation (costochondritis). RISK FACTORS  Risk factors for chest pain may include:  Activities that increase your risk for trauma or injury to your chest.  Respiratory infections or conditions that cause frequent coughing.  Medical conditions or overeating that can cause heartburn.  Heart disease or family history of heart disease.  Conditions or health behaviors that increase your risk of developing a blood clot.  Having had chicken pox (varicella zoster). SIGNS AND SYMPTOMS Chest pain can feel like:  Burning or tingling on the surface of your chest or deep in your chest.  Crushing, pressure, aching, or squeezing pain.  Dull or sharp pain that is worse when you move, cough, or take a deep breath.  Pain that is also felt in your back, neck, shoulder,  or arm, or pain that spreads to any of these areas. Your chest pain may come and go, or it may stay constant. DIAGNOSIS Lab tests or other studies may be needed to find the cause of your pain. Your health care provider may have you take a test called an ambulatory ECG (electrocardiogram). An ECG records your heartbeat patterns at the time the test is performed. You may also have other tests, such as:  Transthoracic echocardiogram (TTE). During echocardiography, sound waves are used to create a picture of all of the heart structures and to look at how blood flows through your heart.  Transesophageal echocardiogram (TEE).This is a more advanced imaging test  that obtains images from inside your body. It allows your health care provider to see your heart in finer detail.  Cardiac monitoring. This allows your health care provider to monitor your heart rate and rhythm in real time.  Holter monitor. This is a portable device that records your heartbeat and can help to diagnose abnormal heartbeats. It allows your health care provider to track your heart activity for several days, if needed.  Stress tests. These can be done through exercise or by taking medicine that makes your heart beat more quickly.  Blood tests.  Imaging tests. TREATMENT  Your treatment depends on what is causing your chest pain. Treatment may include:  Medicines. These may include:  Acid blockers for heartburn.  Anti-inflammatory medicine.  Pain medicine for inflammatory conditions.  Antibiotic medicine, if an infection is present.  Medicines to dissolve blood clots.  Medicines to treat coronary artery disease.  Supportive care for conditions that do not require medicines. This may include:  Resting.  Applying heat or cold packs to injured areas.  Limiting activities until pain decreases. HOME CARE INSTRUCTIONS  If you were prescribed an antibiotic medicine, finish it all even if you start to feel better.  Avoid any activities that bring on chest pain.  Do not use any tobacco products, including cigarettes, chewing tobacco, or electronic cigarettes. If you need help quitting, ask your health care provider.  Do not drink alcohol.  Take medicines only as directed by your health care provider.  Keep all follow-up visits as directed by your health care provider. This is important. This includes any further testing if your chest pain does not go away.  If heartburn is the cause for your chest pain, you may be told to keep your head raised (elevated) while sleeping. This reduces the chance that acid will go from your stomach into your esophagus.  Make  lifestyle changes as directed by your health care provider. These may include:  Getting regular exercise. Ask your health care provider to suggest some activities that are safe for you.  Eating a heart-healthy diet. A registered dietitian can help you to learn healthy eating options.  Maintaining a healthy weight.  Managing diabetes, if necessary.  Reducing stress. SEEK MEDICAL CARE IF:  Your chest pain does not go away after treatment.  You have a rash with blisters on your chest.  You have a fever. SEEK IMMEDIATE MEDICAL CARE IF:   Your chest pain is worse.  You have an increasing cough, or you cough up blood.  You have severe abdominal pain.  You have severe weakness.  You faint.  You have chills.  You have sudden, unexplained chest discomfort.  You have sudden, unexplained discomfort in your arms, back, neck, or jaw.  You have shortness of breath at any time.  You suddenly start  to sweat, or your skin gets clammy.  You feel nauseous or you vomit.  You suddenly feel light-headed or dizzy.  Your heart begins to beat quickly, or it feels like it is skipping beats. These symptoms may represent a serious problem that is an emergency. Do not wait to see if the symptoms will go away. Get medical help right away. Call your local emergency services (911 in the [place] Do not drive yourself to the hospital.   This information is not intended to replace advice given to you by your health care provider. Make sure you discuss any questions you have with your health care provider.   Document Released: 11/18/2004 Document Revised: 03/01/2014 Document Reviewed: 09/14/2013 Elsevier Interactive Patient Education [employer].

## 2016-07-13 DIAGNOSIS — E559 Vitamin D deficiency, unspecified: Secondary | ICD-10-CM | POA: Diagnosis not present

## 2016-10-09 ENCOUNTER — Emergency Department (HOSPITAL_COMMUNITY)
Admission: EM | Admit: 2016-10-09 | Discharge: 2016-10-09 | Disposition: A | Discharge status: Home / Self Care | Payer: Medicaid Other | Attending: Emergency Medicine | Admitting: Emergency Medicine

## 2016-10-09 ENCOUNTER — Encounter (HOSPITAL_COMMUNITY): Payer: Self-pay | Admitting: *Deleted

## 2016-10-09 DIAGNOSIS — Z79899 Other long term (current) drug therapy: Secondary | ICD-10-CM | POA: Diagnosis not present

## 2016-10-09 DIAGNOSIS — L03115 Cellulitis of right lower limb: Secondary | ICD-10-CM | POA: Insufficient documentation

## 2016-10-09 DIAGNOSIS — F17210 Nicotine dependence, cigarettes, uncomplicated: Secondary | ICD-10-CM | POA: Diagnosis not present

## 2016-10-09 DIAGNOSIS — I10 Essential (primary) hypertension: Secondary | ICD-10-CM | POA: Insufficient documentation

## 2016-10-09 DIAGNOSIS — M79604 Pain in right leg: Secondary | ICD-10-CM | POA: Diagnosis present

## 2016-10-09 MED ORDER — CEPHALEXIN 500 MG PO CAPS: 500.0000 mg | ORAL_CAPSULE | Freq: Four times a day (QID) | ORAL | 0 refills | Status: DC

## 2016-10-09 MED ORDER — CEPHALEXIN 250 MG PO CAPS
500.0000 mg | ORAL_CAPSULE | Freq: Once | ORAL | Status: AC
Start: 2016-10-09 — End: 2016-10-09
  Administered 2016-10-09: 500 mg via ORAL
  Filled 2016-10-09: qty 2

## 2016-10-09 MED ORDER — SULFAMETHOXAZOLE-TRIMETHOPRIM 800-160 MG PO TABS: 1.0000 | ORAL_TABLET | Freq: Two times a day (BID) | ORAL | 0 refills | Status: AC

## 2016-10-09 MED ORDER — SULFAMETHOXAZOLE-TRIMETHOPRIM 800-160 MG PO TABS
1.0000 | ORAL_TABLET | Freq: Once | ORAL | Status: AC
  Administered 2016-10-09: 1 via ORAL
  Filled 2016-10-09: qty 1

## 2016-10-09 NOTE — ED Triage Notes (Signed)
Pt states the bolt from a trailer to right calf 1 week ago and now has pain and infected appearance.

## 2016-10-09 NOTE — ED Provider Notes (Signed)
[facility] DEPT Provider Note   CSN: [id number] Arrival date & time: 10/09/16  1221     History   Chief Complaint Chief Complaint  Patient presents with  . Wound Infection    HPI Russell Collier is a 56 y.o. male who presents with right leg pain, swelling, redness. He states that about a week ago a bolt from a trailer went into the back of his leg. It came out whole and did not have any significant bleeding or pain after the accident. He's been keeping it clean over the past week but is slowly becoming more red, swollen, painful. He has not had any discharge from the area. He he has some difficulty walking and feels like "muscle staring". He denies any fevers.  HPI  Past Medical History:  Diagnosis Date  . Hypertension   . Neuropathy   . Pulmonary embolism [facility])     Patient Active Problem List   Diagnosis Date Noted  . Acute bronchitis 12/28/2015  . Atypical chest pain 12/27/2015  . Essential hypertension   . Neuropathy   . Viral illness     Past Surgical History:  Procedure Laterality Date  . PROSTATE SURGERY    . TRANSURETHRAL RESECTION OF PROSTATE         Home Medications    Prior to Admission medications   Medication Sig Start Date End Date Taking? Authorizing Provider  albuterol (PROVENTIL HFA;VENTOLIN HFA) 108 (90 Base) MCG/ACT inhaler Inhale 2 puffs into the lungs every 4 (four) hours as needed for wheezing or shortness of breath. 12/26/15  Yes Roberto Scales, MD  atorvastatin (LIPITOR) 10 MG tablet Take 10 mg by mouth daily. 08/17/16  Yes [provider]  carvedilol (COREG) 6.25 MG tablet Take 1 tablet (6.25 mg total) by mouth 2 (two) times daily with a meal. 12/29/15  Yes Mariel Aloe, MD  enalapril (VASOTEC) 20 MG tablet Take 20 mg by mouth 2 (two) times daily.   Yes [provider]  gabapentin (NEURONTIN) 300 MG capsule Take 300 mg by mouth 3 (three) times daily.   Yes [provider]  Vitamin D, Ergocalciferol, (DRISDOL)  50000 units CAPS capsule Take 50,000 Units by mouth once a week. 09/14/16  Yes [provider]  amLODipine (NORVASC) 5 MG tablet Take 1 tablet (5 mg total) by mouth daily. Patient not taking: Reported on 10/09/2016 12/30/15   Mariel Aloe, MD  guaiFENesin (MUCINEX) 600 MG 12 hr tablet Take 1 tablet (600 mg total) by mouth 2 (two) times daily. Patient not taking: Reported on 10/09/2016 12/29/15   Mariel Aloe, MD  ipratropium-albuterol (DUONEB) 0.5-2.5 (3) MG/3ML SOLN Take 3 mLs by nebulization 3 (three) times daily. Patient not taking: Reported on 10/09/2016 12/29/15   Mariel Aloe, MD    Family History No family history on file.  Social History Social History  Substance Use Topics  . Smoking status: Current Every Day Smoker    Packs/day: 1.00    Types: Cigarettes  . Smokeless tobacco: Never Used  . Alcohol use Yes     Comment: "once in a while"     Allergies   Patient has no known allergies.   Review of Systems Review of Systems  Constitutional: Negative for fever.  Musculoskeletal: Positive for gait problem and myalgias. Negative for arthralgias and joint swelling.  Skin: Positive for color change and wound.  Neurological: Negative for weakness and numbness.  All other systems reviewed and are negative.    Physical Exam Updated  Vital Signs BP (!) 144/99   Pulse 77   Temp 97.7 F (36.5 C) (Oral)   Resp 16   Ht 5\' 11"  (1.803 m)   Wt 136.1 kg (300 lb)   SpO2 98%   BMI 41.84 kg/m   Physical Exam  Constitutional: He is oriented to person, place, and time. He appears well-developed and well-nourished. No distress.  Pleasant male in NAD, asking for food  HENT:  Head: Normocephalic and atraumatic.  Old surgical scar on left side of face  Eyes: Pupils are equal, round, and reactive to light. Conjunctivae are normal. Right eye exhibits no discharge. Left eye exhibits no discharge. No scleral icterus.  Neck: Normal range of motion.  Cardiovascular: Normal  rate.   Pulmonary/Chest: Effort normal. No respiratory distress.  Abdominal: He exhibits no distension.  Musculoskeletal:  Circular wound over mid-calf with eschar and surrounding erythema. There is another linear wound with eschar over the calf as well with tenderness.  Neurological: He is alert and oriented to person, place, and time.  Skin: Skin is warm and dry.  Psychiatric: He has a normal mood and affect. His behavior is normal.  Nursing note and vitals reviewed.    ED Treatments / Results  Labs (all labs ordered are listed, but only abnormal results are displayed) Labs Reviewed - No data to display  EKG  EKG Interpretation None       Radiology No results found.  Procedures Procedures (including critical care time)  Medications Ordered in ED Medications  cephALEXin (KEFLEX) capsule 500 mg (500 mg Oral Given 10/09/16 2117)  sulfamethoxazole-trimethoprim (BACTRIM DS,SEPTRA DS) 800-160 MG per tablet 1 tablet (1 tablet Oral Given 10/09/16 2117)     Initial Impression / Assessment and Plan / ED Course  I have reviewed the triage vital signs and the nursing notes.  Pertinent labs & imaging results that were available during my care of the patient were reviewed by me and considered in my medical decision making (see chart for details).  56 year old male with cellulitis. He is hypertensive but otherwise vitals are normal. I discussed following up with his doctor about better BP control and he states he has an appointment coming up. Wound appears to be consistent with cellulitis. Soft tissue US was performed which did not show evidence of abscess. He will be given Keflex and Bactrim and advised return in 3 days if not improving. He verbalized understanding.  Final Clinical Impressions(s) / ED Diagnoses   Final diagnoses:  Cellulitis of right lower extremity  Hypertension, unspecified type    New Prescriptions New Prescriptions   No medications on file     Recardo Evangelist, PA-C 10/10/16 1517    Pattricia Boss, MD 10/11/16 [phone]

## 2016-10-09 NOTE — Discharge Instructions (Signed)
Please take Keflex four times a day for the next week Take Bactrim twice a day for the next week Return in 3 days if symptoms are getting worse

## 2017-08-02 DIAGNOSIS — Z1331 Encounter for screening for depression: Secondary | ICD-10-CM | POA: Diagnosis not present

## 2017-08-02 DIAGNOSIS — Z1339 Encounter for screening examination for other mental health and behavioral disorders: Secondary | ICD-10-CM | POA: Diagnosis not present

## 2017-08-02 DIAGNOSIS — Z0000 Encounter for general adult medical examination without abnormal findings: Secondary | ICD-10-CM | POA: Diagnosis not present

## 2017-08-02 DIAGNOSIS — R0602 Shortness of breath: Secondary | ICD-10-CM | POA: Diagnosis not present

## 2017-08-30 DIAGNOSIS — I10 Essential (primary) hypertension: Secondary | ICD-10-CM | POA: Diagnosis not present

## 2017-08-30 DIAGNOSIS — I119 Hypertensive heart disease without heart failure: Secondary | ICD-10-CM | POA: Diagnosis not present

## 2017-08-30 DIAGNOSIS — J449 Chronic obstructive pulmonary disease, unspecified: Secondary | ICD-10-CM | POA: Diagnosis not present

## 2017-08-30 DIAGNOSIS — E1142 Type 2 diabetes mellitus with diabetic polyneuropathy: Secondary | ICD-10-CM | POA: Diagnosis not present

## 2017-08-30 DIAGNOSIS — Z122 Encounter for screening for malignant neoplasm of respiratory organs: Secondary | ICD-10-CM | POA: Diagnosis not present

## 2017-08-30 DIAGNOSIS — E782 Mixed hyperlipidemia: Secondary | ICD-10-CM | POA: Diagnosis not present

## 2017-08-30 DIAGNOSIS — I6529 Occlusion and stenosis of unspecified carotid artery: Secondary | ICD-10-CM | POA: Diagnosis not present

## 2017-09-06 DIAGNOSIS — Z7984 Long term (current) use of oral hypoglycemic drugs: Secondary | ICD-10-CM | POA: Insufficient documentation

## 2017-09-06 DIAGNOSIS — H43393 Other vitreous opacities, bilateral: Secondary | ICD-10-CM | POA: Diagnosis not present

## 2017-09-06 DIAGNOSIS — H16222 Keratoconjunctivitis sicca, not specified as Sjogren's, left eye: Secondary | ICD-10-CM | POA: Insufficient documentation

## 2017-09-06 DIAGNOSIS — H52203 Unspecified astigmatism, bilateral: Secondary | ICD-10-CM | POA: Diagnosis not present

## 2017-09-06 DIAGNOSIS — H02125 Mechanical ectropion of left lower eyelid: Secondary | ICD-10-CM | POA: Insufficient documentation

## 2017-09-06 DIAGNOSIS — E119 Type 2 diabetes mellitus without complications: Secondary | ICD-10-CM | POA: Diagnosis not present

## 2017-09-06 DIAGNOSIS — H5203 Hypermetropia, bilateral: Secondary | ICD-10-CM | POA: Diagnosis not present

## 2017-09-06 DIAGNOSIS — H5213 Myopia, bilateral: Secondary | ICD-10-CM | POA: Diagnosis not present

## 2017-09-06 DIAGNOSIS — H53022 Refractive amblyopia, left eye: Secondary | ICD-10-CM | POA: Insufficient documentation

## 2017-09-06 DIAGNOSIS — H2513 Age-related nuclear cataract, bilateral: Secondary | ICD-10-CM | POA: Insufficient documentation

## 2017-09-06 DIAGNOSIS — H524 Presbyopia: Secondary | ICD-10-CM | POA: Diagnosis not present

## 2017-11-02 ENCOUNTER — Emergency Department (HOSPITAL_COMMUNITY): Payer: Medicaid Other

## 2017-11-02 ENCOUNTER — Emergency Department (HOSPITAL_COMMUNITY)
Admission: EM | Admit: 2017-11-02 | Discharge: 2017-11-02 | Disposition: A | Discharge status: Home / Self Care | Payer: Medicaid Other | Attending: Emergency Medicine | Admitting: Emergency Medicine

## 2017-11-02 ENCOUNTER — Other Ambulatory Visit: Payer: Self-pay

## 2017-11-02 ENCOUNTER — Encounter (HOSPITAL_COMMUNITY): Payer: Self-pay

## 2017-11-02 DIAGNOSIS — R040 Epistaxis: Secondary | ICD-10-CM

## 2017-11-02 DIAGNOSIS — F17210 Nicotine dependence, cigarettes, uncomplicated: Secondary | ICD-10-CM | POA: Diagnosis not present

## 2017-11-02 DIAGNOSIS — R042 Hemoptysis: Secondary | ICD-10-CM | POA: Diagnosis not present

## 2017-11-02 DIAGNOSIS — I10 Essential (primary) hypertension: Secondary | ICD-10-CM | POA: Diagnosis not present

## 2017-11-02 DIAGNOSIS — R079 Chest pain, unspecified: Secondary | ICD-10-CM | POA: Diagnosis not present

## 2017-11-02 DIAGNOSIS — Z79899 Other long term (current) drug therapy: Secondary | ICD-10-CM | POA: Diagnosis not present

## 2017-11-02 DIAGNOSIS — R0602 Shortness of breath: Secondary | ICD-10-CM

## 2017-11-02 LAB — BASIC METABOLIC PANEL
ANION GAP: 10 (ref 5–15)
BUN: 10 mg/dL (ref 6–20)
CHLORIDE: 102 mmol/L (ref 98–111)
CO2: 29 mmol/L (ref 22–32)
Calcium: 9.3 mg/dL (ref 8.9–10.3)
Creatinine, Ser: 1.01 mg/dL (ref 0.61–1.24)
GFR calc Af Amer: >60 mL/min (ref >60)
Glucose, Bld: 103 mg/dL — ABNORMAL HIGH (ref 70–99)
POTASSIUM: 3.7 mmol/L (ref 3.5–5.1)
SODIUM: 141 mmol/L (ref 135–145)

## 2017-11-02 LAB — HEPATIC FUNCTION PANEL
ALBUMIN: 3.5 g/dL (ref 3.5–5.0)
ALK PHOS: 60 U/L (ref 38–126)
ALT: 25 U/L (ref 0–44)
AST: 22 U/L (ref 15–41)
Bilirubin, Direct: 0.2 mg/dL (ref 0.0–0.2)
Indirect Bilirubin: 0.7 mg/dL (ref 0.3–0.9)
TOTAL PROTEIN: 5.9 g/dL — AB (ref 6.5–8.1)
Total Bilirubin: 0.9 mg/dL (ref 0.3–1.2)

## 2017-11-02 LAB — CBC
HEMATOCRIT: 41.6 % (ref 39.0–52.0)
HEMOGLOBIN: 13.5 g/dL (ref 13.0–17.0)
MCH: 29.9 pg (ref 26.0–34.0)
MCHC: 32.5 g/dL (ref 30.0–36.0)
MCV: 92.2 fL (ref 78.0–100.0)
Platelets: 217 10*3/uL (ref 150–400)
RBC: 4.51 MIL/uL (ref 4.22–5.81)
RDW: 15.2 % (ref 11.5–15.5)
WBC: 9.2 10*3/uL (ref 4.0–10.5)

## 2017-11-02 LAB — I-STAT TROPONIN, ED: Troponin i, poc: 0.04 ng/mL (ref 0.00–0.08)

## 2017-11-02 MED ORDER — IOPAMIDOL (ISOVUE-370) INJECTION 76%
INTRAVENOUS | Status: AC
  Administered 2017-11-02: 100 mL via INTRAVENOUS
  Filled 2017-11-02: qty 100

## 2017-11-02 NOTE — ED Provider Notes (Signed)
[facility] Provider Note   CSN: [id number] Arrival date & time: 11/02/17  0913     History   Chief Complaint Chief Complaint  Patient presents with  . Chest Pain    HPI [facility] is a 57 y.o. male.  The history is provided by the patient.  Chest Pain   This is a new problem. The current episode started more than 2 days ago. The problem occurs constantly. The problem has not changed since onset.The pain is associated with coughing (coughing a lot and having intermittent blood come up, just on tissue paper. ). The pain is present in the substernal region. The pain is at a severity of 2/10. The pain is mild. The quality of the pain is described as sharp. The pain does not radiate. Exacerbated by: coughing. Associated symptoms include hemoptysis. Pertinent negatives include no abdominal pain, no back pain, no claudication, no cough, no diaphoresis, no dizziness, no fever, no headaches, no irregular heartbeat, no leg pain, no lower extremity edema, no malaise/fatigue, no nausea, no numbness, no orthopnea, no palpitations, no shortness of breath, no syncope, no vomiting and no weakness. Risk factors include male gender, obesity and smoking/tobacco exposure.  His past medical history is significant for hypertension and PE (no longer on blood thinners).  Pertinent negatives for past medical history include no seizures.    Past Medical History:  Diagnosis Date  . Hypertension   . Neuropathy   . Pulmonary embolism [facility])     Patient Active Problem List   Diagnosis Date Noted  . Acute bronchitis 12/28/2015  . Atypical chest pain 12/27/2015  . Essential hypertension   . Neuropathy   . Viral illness     Past Surgical History:  Procedure Laterality Date  . PROSTATE SURGERY    . TRANSURETHRAL RESECTION OF PROSTATE          Home Medications    Prior to Admission medications   Medication Sig Start Date End Date Taking? Authorizing  Provider  albuterol (PROVENTIL HFA;VENTOLIN HFA) 108 (90 Base) MCG/ACT inhaler Inhale 2 puffs into the lungs every 4 (four) hours as needed for wheezing or shortness of breath. 12/26/15  Yes Roberto Scales, MD  atorvastatin (LIPITOR) 10 MG tablet Take 10 mg by mouth daily. 08/17/16  Yes [provider]  carvedilol (COREG) 6.25 MG tablet Take 1 tablet (6.25 mg total) by mouth 2 (two) times daily with a meal. Patient taking differently: Take 3.125 mg by mouth 2 (two) times daily with a meal.  12/29/15  Yes Mariel Aloe, MD  enalapril (VASOTEC) 20 MG tablet Take 40 mg by mouth at bedtime.    Yes [provider]  gabapentin (NEURONTIN) 300 MG capsule Take 300 mg by mouth 3 (three) times daily.   Yes [provider]  valACYclovir (VALTREX) 500 MG tablet Take 500 mg by mouth daily at 12 noon. 10/20/17  Yes [provider]  Vitamin D, Ergocalciferol, (DRISDOL) 50000 units CAPS capsule Take 50,000 Units by mouth once a week. 09/14/16  Yes [provider]  cephALEXin (KEFLEX) 500 MG capsule Take 1 capsule (500 mg total) by mouth 4 (four) times daily. Patient not taking: Reported on 11/02/2017 10/09/16   Recardo Evangelist, PA-C    Family History History reviewed. No pertinent family history.  Social History Social History   Tobacco Use  . Smoking status: Current Every Day Smoker    Packs/day: 1.00    Types: Cigarettes  . Smokeless tobacco:  Never Used  Substance Use Topics  . Alcohol use: Yes    Comment: "once in a while"  . Drug use: No     Allergies   Patient has no known allergies.   Review of Systems Review of Systems  Constitutional: Negative for chills, diaphoresis, fever and malaise/fatigue.  HENT: Negative for ear pain and sore throat.   Eyes: Negative for pain and visual disturbance.  Respiratory: Positive for hemoptysis. Negative for cough and shortness of breath.   Cardiovascular: Positive for chest pain. Negative for palpitations,  orthopnea, claudication and syncope.  Gastrointestinal: Negative for abdominal pain, nausea and vomiting.  Genitourinary: Negative for dysuria and hematuria.  Musculoskeletal: Negative for arthralgias and back pain.  Skin: Negative for color change and rash.  Neurological: Negative for dizziness, seizures, syncope, weakness, numbness and headaches.  All other systems reviewed and are negative.    Physical Exam Updated Vital Signs  ED Triage Vitals  Enc Vitals Group     BP 11/02/17 0931 (!) 141/79     Pulse Rate 11/02/17 0931 64     Resp 11/02/17 0931 20     Temp 11/02/17 0931 98.8 F (37.1 C)     Temp Source 11/02/17 0931 Oral     SpO2 11/02/17 0931 98 %     Weight 11/02/17 0932 288 lb (130.6 kg)     Height 11/02/17 0932 5\' 10"  (1.778 m)     Head Circumference --      Peak Flow --      Pain Score 11/02/17 0931 6     Pain Loc --      Pain Edu? --      Excl. in [place]? --     Physical Exam  Constitutional: He appears well-developed and well-nourished.  HENT:  Head: Normocephalic and atraumatic.  Eyes: Pupils are equal, round, and reactive to light. Conjunctivae and EOM are normal.  Neck: Normal range of motion. Neck supple.  Cardiovascular: Normal rate, regular rhythm, intact distal pulses and normal pulses.  No murmur heard. Pulmonary/Chest: Effort normal and breath sounds normal. No respiratory distress. He has no decreased breath sounds. He has no wheezes. He has no rhonchi. He has no rales.  Abdominal: Soft. There is no tenderness.  Musculoskeletal: He exhibits no edema.       Right lower leg: He exhibits no edema.  Neurological: He is alert.  Skin: Skin is warm and dry. Capillary refill takes less than 2 seconds.  Psychiatric: He has a normal mood and affect.  Nursing note and vitals reviewed.    ED Treatments / Results  Labs (all labs ordered are listed, but only abnormal results are displayed) Labs Reviewed  BASIC METABOLIC PANEL - Abnormal; Notable for the  following components:      Result Value   Glucose, Bld 103 (*)    All other components within normal limits  HEPATIC FUNCTION PANEL - Abnormal; Notable for the following components:   Total Protein 5.9 (*)    All other components within normal limits  CBC  I-STAT TROPONIN, ED    EKG EKG Interpretation  Date/Time:  Wednesday November 02 2017 09:29:04 EDT Ventricular Rate:  63 PR Interval:  216 QRS Duration: 132 QT Interval:  446 QTC Calculation: 456 R Axis:   -52 Text Interpretation:  Sinus rhythm with 1st degree A-V block with occasional Premature ventricular complexes Left axis deviation Left ventricular hypertrophy with QRS widening T wave abnormality, consider inferolateral ischemia Confirmed by Lennice Sites [phone]) on  11/02/2017 10:03:36 AM   Radiology Dg Chest 2 View  Result Date: 11/02/2017 CLINICAL DATA:  Chest pain for 2 days EXAM: CHEST - 2 VIEW COMPARISON:  12/26/2015 FINDINGS: Cardiac shadow is stable. The lungs are well aerated bilaterally with mild interstitial changes stable from the prior exam. No focal infiltrate or sizable effusion is seen. Mild degenerative changes of the thoracic spine are noted. IMPRESSION: Chronic changes without acute abnormality. Electronically Signed   By: Inez Catalina M.D.   On: 11/02/2017 10:10   Ct Angio Chest Pe W And/or Wo Contrast  Result Date: 11/02/2017 CLINICAL DATA:  LEFT UPPER chest pain associated with shortness of breath and intermittent hemoptysis over the past several days. Patient states personal history of pulmonary embolism approximately 20 years ago. Current smoker. EXAM: CT ANGIOGRAPHY CHEST WITH CONTRAST TECHNIQUE: Multidetector CT imaging of the chest was performed using the standard protocol during bolus administration of intravenous contrast. Multiplanar CT image reconstructions and MIPs were obtained to evaluate the vascular anatomy. CONTRAST:  100 mL ISOVUE-370 IOPAMIDOL INJECTION 76% IV. COMPARISON:  No prior CT.   Multiple prior chest x-rays. FINDINGS: Cardiovascular: Preferential excellent opacification of the pulmonary arterial system. No filling defects within either main pulmonary artery or their segmental branches in either lung to suggest pulmonary embolism. Heart moderately enlarged with LEFT ventricular enlargement. Very small pericardial effusion. No visible coronary atherosclerosis. Minimal atherosclerosis at the origin of the innominate artery. No evidence of thoracic or proximal abdominal aortic atherosclerosis or aneurysm. Mediastinum/Nodes: Dystrophic calcification versus densely calcified lymph nodes in the AP window region. Mildly enlarged RIGHT hilar lymph nodes measuring approximately 2.3 x 2.7 cm (series 5, image 39). No pathologic lymphadenopathy elsewhere. Normal appearing esophagus. Visualized thyroid gland normal in appearance. Lungs/Pleura: Emphysematous changes in both lungs. Multiple areas of focal hyperlucency throughout both lungs, with gives the remainder of the pulmonary parenchyma an almost ground-glass appearance. Approximate 5 x 6 mm (6 mm mean diameter) nodule anteriorly in the RIGHT UPPER LOBE abutting the pleural surface (series 6, image 33). No parenchymal nodules or masses elsewhere. No confluent airspace consolidation. Central airways patent with severe bronchial wall thickening. Upper Abdomen: Unremarkable for what is essentially the unenhanced technique as there is little to no contrast in the abdominal aorta. Musculoskeletal: Degenerative disc disease and spondylosis involving the mid and LOWER thoracic spine. Calcification in the POSTERIOR longitudinal ligament at multiple thoracic levels. No acute findings. Other: Moderate BILATERAL gynecomastia. Review of the MIP images confirms the above findings. IMPRESSION: 1. No evidence of pulmonary embolism. 2. 6 mm nodule anteriorly in the RIGHT UPPER LOBE abutting the pleural surface. No parenchymal nodules elsewhere. Non-contrast chest CT  at 6-12 months is recommended. If the nodule is stable at time of repeat CT, then future CT at 18-24 months (from today's scan) is considered optional for low-risk patients, but is recommended for high-risk patients. This recommendation follows the consensus statement: Guidelines for Management of Incidental Pulmonary Nodules Detected on CT Images: From the Fleischner Society 2017; Radiology 2017; 284:228-243. 3. Cardiomegaly with LEFT ventricular enlargement. Very small pericardial effusion. 4. Multiple areas of localized air trapping and marked central bronchial wall thickening indicates acute asthma and/or bronchitis. 5. Solitary mildly enlarged RIGHT hilar lymph node, statistically reactive. Attention to this on the follow-up CT is warranted. Aortic Atherosclerosis (ICD10-I70.0) and Emphysema (ICD10-J43.9). Electronically Signed   By: Evangeline Dakin M.D.   On: 11/02/2017 12:06    Procedures Procedures (including critical care time)  Medications Ordered in ED Medications  iopamidol (  ISOVUE-370) 76 % injection (100 mLs Intravenous Contrast Given 11/02/17 1130)     Initial Impression / Assessment and Plan / ED Course  I have reviewed the triage vital signs and the nursing notes.  Pertinent labs & imaging results that were available during my care of the patient were reviewed by me and considered in my medical decision making (see chart for details).     Knolan Simien is a 57 year old male with history of hypertension, prior pulmonary embolism no longer on anticoagulation who presents to the ED with nosebleed, hemoptysis, chest pain, shortness of breath.  Patient with normal vitals.  No fever.  Patient over the last several days has had some nosebleeds and coughing up of blood.  He has had some chest pain when he coughs.  Has felt short of breath.  Patient denies any trauma.  He is a current smoker.  He has borderline COPD supposedly and has followed with pulmonology.  Patient overall  well-appearing on exam.  Clear breath sounds bilaterally.  No signs of volume overload.  EKG showed no signs of ischemic changes.  Troponin within normal limits.  No active chest pain and chest pain only with coughing and no concern for ACS at this time.  Patient with chest x-ray that showed no signs of pneumonia, pneumothorax, pleural effusion.  No significant anemia, electrolyte abnormality, kidney injury.  Concern for possible PE given his history of prior pulmonary embolism.  CT scan showed pulmonary nodule and concern for possibly cancer in this patient with significant smoking history.  Patient hemodynamically stable throughout my care.  He has had some coughing up of blood over the last several days that was enough to get a napkin dirty.  No large volume hemoptysis.  No liver disease.  Does not abuse alcohol. No concern for varices.  Patient states that he is also had nosebleeds over the last several days.  So possibly patient having nosebleeds and coughing up blood from nosebleeds.  No concern for active pulmonary hemorrhage.  However, recommend close follow-up with primary doctor and pulmonologist.  Will likely need CT scan in the future to follow for cancer and made aware of this.  No need for emergent bronchoscopy but may benefit from bronchoscopy if continues to have hemoptysis.  Told to return to the ED if symptoms worsen.  Discharged in good condition.  Final Clinical Impressions(s) / ED Diagnoses   Final diagnoses:  Nosebleed  SOB (shortness of breath)  Hemoptysis    ED Discharge Orders    None       Lennice Sites, DO 11/02/17 1253

## 2017-11-02 NOTE — ED Triage Notes (Signed)
Pt developed Left sided CP 4 days ago described as a "needle stabbing". Pt reports CP as continuous.  Pt also c/o vomiting blood and bloody nose.  Pt has HTN otherwise denies cardiac history.

## 2017-11-02 NOTE — ED Notes (Signed)
Patient transported to X-ray 

## 2017-11-02 NOTE — Discharge Instructions (Addendum)
Please return to the ED if you have continued bleeding as we discussed.  Follow-up with your primary care doctor and your pulmonologist about your CT scan and pulmonary nodule.

## 2017-11-08 DIAGNOSIS — J449 Chronic obstructive pulmonary disease, unspecified: Secondary | ICD-10-CM | POA: Diagnosis not present

## 2017-11-08 DIAGNOSIS — Z79899 Other long term (current) drug therapy: Secondary | ICD-10-CM | POA: Diagnosis not present

## 2017-11-08 DIAGNOSIS — I10 Essential (primary) hypertension: Secondary | ICD-10-CM | POA: Diagnosis not present

## 2017-11-08 DIAGNOSIS — E782 Mixed hyperlipidemia: Secondary | ICD-10-CM | POA: Diagnosis not present

## 2017-11-08 DIAGNOSIS — D539 Nutritional anemia, unspecified: Secondary | ICD-10-CM | POA: Diagnosis not present

## 2017-11-08 DIAGNOSIS — Z09 Encounter for follow-up examination after completed treatment for conditions other than malignant neoplasm: Secondary | ICD-10-CM | POA: Diagnosis not present

## 2017-11-08 DIAGNOSIS — R911 Solitary pulmonary nodule: Secondary | ICD-10-CM | POA: Diagnosis not present

## 2017-11-08 DIAGNOSIS — E1142 Type 2 diabetes mellitus with diabetic polyneuropathy: Secondary | ICD-10-CM | POA: Diagnosis not present

## 2018-02-01 ENCOUNTER — Emergency Department (HOSPITAL_COMMUNITY): Payer: Medicaid Other

## 2018-02-01 ENCOUNTER — Inpatient Hospital Stay (HOSPITAL_COMMUNITY)
Admission: EM | Admit: 2018-02-01 | Discharge: 2018-02-07 | DRG: 286 | Disposition: A | Discharge status: Home / Self Care | Payer: Medicaid Other | Attending: Internal Medicine | Admitting: Internal Medicine

## 2018-02-01 DIAGNOSIS — J45909 Unspecified asthma, uncomplicated: Secondary | ICD-10-CM | POA: Diagnosis present

## 2018-02-01 DIAGNOSIS — Z79899 Other long term (current) drug therapy: Secondary | ICD-10-CM

## 2018-02-01 DIAGNOSIS — G629 Polyneuropathy, unspecified: Secondary | ICD-10-CM | POA: Diagnosis present

## 2018-02-01 DIAGNOSIS — R0689 Other abnormalities of breathing: Secondary | ICD-10-CM | POA: Diagnosis not present

## 2018-02-01 DIAGNOSIS — J9601 Acute respiratory failure with hypoxia: Secondary | ICD-10-CM | POA: Diagnosis not present

## 2018-02-01 DIAGNOSIS — F1290 Cannabis use, unspecified, uncomplicated: Secondary | ICD-10-CM | POA: Diagnosis present

## 2018-02-01 DIAGNOSIS — Z720 Tobacco use: Secondary | ICD-10-CM | POA: Diagnosis present

## 2018-02-01 DIAGNOSIS — I10 Essential (primary) hypertension: Secondary | ICD-10-CM | POA: Diagnosis present

## 2018-02-01 DIAGNOSIS — I160 Hypertensive urgency: Secondary | ICD-10-CM | POA: Diagnosis present

## 2018-02-01 DIAGNOSIS — I110 Hypertensive heart disease with heart failure: Principal | ICD-10-CM | POA: Diagnosis present

## 2018-02-01 DIAGNOSIS — J80 Acute respiratory distress syndrome: Secondary | ICD-10-CM | POA: Diagnosis not present

## 2018-02-01 DIAGNOSIS — I509 Heart failure, unspecified: Secondary | ICD-10-CM

## 2018-02-01 DIAGNOSIS — Z9079 Acquired absence of other genital organ(s): Secondary | ICD-10-CM

## 2018-02-01 DIAGNOSIS — E1165 Type 2 diabetes mellitus with hyperglycemia: Secondary | ICD-10-CM | POA: Diagnosis present

## 2018-02-01 DIAGNOSIS — R778 Other specified abnormalities of plasma proteins: Secondary | ICD-10-CM | POA: Diagnosis present

## 2018-02-01 DIAGNOSIS — J811 Chronic pulmonary edema: Secondary | ICD-10-CM | POA: Diagnosis not present

## 2018-02-01 DIAGNOSIS — R825 Elevated urine levels of drugs, medicaments and biological substances: Secondary | ICD-10-CM | POA: Diagnosis present

## 2018-02-01 DIAGNOSIS — F1190 Opioid use, unspecified, uncomplicated: Secondary | ICD-10-CM | POA: Diagnosis present

## 2018-02-01 DIAGNOSIS — Z713 Dietary counseling and surveillance: Secondary | ICD-10-CM

## 2018-02-01 DIAGNOSIS — N179 Acute kidney failure, unspecified: Secondary | ICD-10-CM | POA: Diagnosis present

## 2018-02-01 DIAGNOSIS — R0789 Other chest pain: Secondary | ICD-10-CM | POA: Diagnosis present

## 2018-02-01 DIAGNOSIS — I2510 Atherosclerotic heart disease of native coronary artery without angina pectoris: Secondary | ICD-10-CM | POA: Diagnosis present

## 2018-02-01 DIAGNOSIS — I447 Left bundle-branch block, unspecified: Secondary | ICD-10-CM | POA: Diagnosis not present

## 2018-02-01 DIAGNOSIS — F17210 Nicotine dependence, cigarettes, uncomplicated: Secondary | ICD-10-CM | POA: Diagnosis present

## 2018-02-01 DIAGNOSIS — Z6841 Body Mass Index (BMI) 40.0 and over, adult: Secondary | ICD-10-CM

## 2018-02-01 DIAGNOSIS — E785 Hyperlipidemia, unspecified: Secondary | ICD-10-CM | POA: Diagnosis present

## 2018-02-01 DIAGNOSIS — R0902 Hypoxemia: Secondary | ICD-10-CM | POA: Diagnosis not present

## 2018-02-01 DIAGNOSIS — Z86711 Personal history of pulmonary embolism: Secondary | ICD-10-CM

## 2018-02-01 DIAGNOSIS — Z833 Family history of diabetes mellitus: Secondary | ICD-10-CM

## 2018-02-01 DIAGNOSIS — R069 Unspecified abnormalities of breathing: Secondary | ICD-10-CM | POA: Diagnosis not present

## 2018-02-01 DIAGNOSIS — I5033 Acute on chronic diastolic (congestive) heart failure: Secondary | ICD-10-CM | POA: Diagnosis present

## 2018-02-01 DIAGNOSIS — R7989 Other specified abnormal findings of blood chemistry: Secondary | ICD-10-CM | POA: Diagnosis present

## 2018-02-01 DIAGNOSIS — Z7151 Drug abuse counseling and surveillance of drug abuser: Secondary | ICD-10-CM

## 2018-02-01 DIAGNOSIS — Z23 Encounter for immunization: Secondary | ICD-10-CM

## 2018-02-01 DIAGNOSIS — K219 Gastro-esophageal reflux disease without esophagitis: Secondary | ICD-10-CM | POA: Diagnosis present

## 2018-02-01 DIAGNOSIS — N400 Enlarged prostate without lower urinary tract symptoms: Secondary | ICD-10-CM | POA: Diagnosis present

## 2018-02-01 HISTORY — DX: Unspecified osteoarthritis, unspecified site: M19.90

## 2018-02-01 HISTORY — DX: Hereditary factor VIII deficiency: D66

## 2018-02-01 HISTORY — DX: Pure hypercholesterolemia, unspecified: E78.00

## 2018-02-01 HISTORY — DX: Dependence on supplemental oxygen: Z99.81

## 2018-02-01 HISTORY — DX: Personal history of other medical treatment: Z92.89

## 2018-02-01 LAB — I-STAT ARTERIAL BLOOD GAS, ED
Acid-base deficit: 2 mmol/L (ref 0.0–2.0)
Bicarbonate: 24 mmol/L (ref 20.0–28.0)
O2 Saturation: 100 %
Patient temperature: 98.6
TCO2: 25 mmol/L (ref 22–32)
pCO2 arterial: 44.7 mmHg (ref 32.0–48.0)
pH, Arterial: 7.338 — ABNORMAL LOW (ref 7.350–7.450)
pO2, Arterial: 286 mmHg — ABNORMAL HIGH (ref 83.0–108.0)

## 2018-02-01 MED ORDER — MORPHINE SULFATE (PF) 4 MG/ML IV SOLN
6.0000 mg | Freq: Once | INTRAVENOUS | Status: AC
  Administered 2018-02-02: 6 mg via INTRAVENOUS
  Filled 2018-02-01: qty 2

## 2018-02-01 MED ORDER — NITROGLYCERIN 2 % TD OINT
1.0000 [in_us] | TOPICAL_OINTMENT | Freq: Four times a day (QID) | TRANSDERMAL | Status: DC
  Administered 2018-02-01 – 2018-02-03 (×7): 1 [in_us] via TOPICAL
  Filled 2018-02-01 (×2): qty 1
  Filled 2018-02-01: qty 30
  Filled 2018-02-01: qty 1

## 2018-02-01 MED ORDER — HYDRALAZINE HCL 20 MG/ML IJ SOLN
10.0000 mg | Freq: Once | INTRAMUSCULAR | Status: DC
  Filled 2018-02-01: qty 1

## 2018-02-01 NOTE — ED Notes (Signed)
Patient reports fever yesterday, cough "for months", and chest pain today.   MD at bedside.

## 2018-02-01 NOTE — ED Triage Notes (Signed)
Patient BIB [facility] for respiratory distress. Tripod on EMS arrival with pink frothy sputum. O2 70% initially and   88% on CPAP. Unable to obtain history from family or patient.    HR 120  18g RIGHT hand

## 2018-02-01 NOTE — ED Provider Notes (Signed)
Performed ultrasound  EMERGENCY DEPARTMENT [place] LUNG EXAM "Study: Limited Ultrasound of the Lung and Thorax"  INDICATIONS: Dyspnea and Chest pain Multiple views of both lungs using sagittal orientation were obtained.  PERFORMED BY: Myself IMAGES ARCHIVED?: Yes LIMITATIONS: Body habitus VIEWS USED: Anterior lung fields INTERPRETATION: Pulmonary edema, B Lines    EMERGENCY DEPARTMENT [place] CARDIAC EXAM "Study: Limited Ultrasound of the Heart and Pericardium"  INDICATIONS:Cardiac arrest and Dyspnea Multiple views of the heart and pericardium were obtained in real-time with a multi-frequency probe.  PERFORMED KP:[id number] IMAGES ARCHIVED?: Yes LIMITATIONS:  Body habitus and Emergent procedure VIEWS USED: Subcostal 4 chamber INTERPRETATION: Pericardial effusioin absent, difficult to get good window, but no obvious effusion, decreased contractility.    Overall, ultrasound consistent with volume overload.   Lennice Sites, DO 02/01/18 2315

## 2018-02-01 NOTE — ED Provider Notes (Addendum)
[facility] EMERGENCY DEPARTMENT Provider Note   CSN: [id number] Arrival date & time: 02/01/18  2256     History   Chief Complaint Chief Complaint  Patient presents with  . Respiratory Distress    HPI Yordin Rhoda is a 57 y.o. male.  HPI Level 5 caveat for respiratory distress.  57 year old male comes in with chief complaint of respiratory distress. Patient has history of hypertension, remote history of PE not on any anticoagulant.  He reports that over the past 2 or 3 months he has been having intermittent episodes of chest discomfort, chronic cough and shortness of breath.  This evening his shortness of breath acutely got worse and he called 911. When EMS arrived, patient was noted to have O2 sats in the 70s.  He was placed on CPAP and rushed to the emergency department.  Currently patient is having left-sided chest discomfort described as throbbing type pain.  He also states that he has had some numbness in his left upper extremity.  Patient does not have any history of coronary artery disease.  Our records indicate that he had a stress echo in 2016 which was negative.  He also states that he has been having episodes of sweats and subjective fevers.  Past Medical History:  Diagnosis Date  . Hypertension   . Neuropathy   . Pulmonary embolism [facility])     Patient Active Problem List   Diagnosis Date Noted  . Acute respiratory failure with hypoxia ([place]) 02/02/2018  . Acute bronchitis 12/28/2015  . Atypical chest pain 12/27/2015  . Essential hypertension   . Neuropathy   . Viral illness     Past Surgical History:  Procedure Laterality Date  . PROSTATE SURGERY    . TRANSURETHRAL RESECTION OF PROSTATE          Home Medications    Prior to Admission medications   Medication Sig Start Date End Date Taking? Authorizing Provider  albuterol (PROVENTIL HFA;VENTOLIN HFA) 108 (90 Base) MCG/ACT inhaler Inhale 2 puffs into the lungs every 4 (four) hours  as needed for wheezing or shortness of breath. 12/26/15   Roberto Scales, MD  atorvastatin (LIPITOR) 10 MG tablet Take 10 mg by mouth daily. 08/17/16   [provider]  carvedilol (COREG) 6.25 MG tablet Take 1 tablet (6.25 mg total) by mouth 2 (two) times daily with a meal. Patient taking differently: Take 3.125 mg by mouth 2 (two) times daily with a meal.  12/29/15   Mariel Aloe, MD  cephALEXin (KEFLEX) 500 MG capsule Take 1 capsule (500 mg total) by mouth 4 (four) times daily. Patient not taking: Reported on 11/02/2017 10/09/16   Recardo Evangelist, PA-C  enalapril (VASOTEC) 20 MG tablet Take 40 mg by mouth at bedtime.     [provider]  gabapentin (NEURONTIN) 300 MG capsule Take 300 mg by mouth 3 (three) times daily.    [provider]  valACYclovir (VALTREX) 500 MG tablet Take 500 mg by mouth daily at 12 noon. 10/20/17   [provider]  Vitamin D, Ergocalciferol, (DRISDOL) 50000 units CAPS capsule Take 50,000 Units by mouth once a week. 09/14/16   [provider]    Family History Family History  Problem Relation Age of Onset  . Diabetes Mellitus II Father   . Diabetes Mellitus II Maternal Grandmother     Social History Social History   Tobacco Use  . Smoking status: Current Every Day Smoker    Packs/day: 1.00  Types: Cigarettes  . Smokeless tobacco: Never Used  Substance Use Topics  . Alcohol use: Yes    Comment: "once in a while"  . Drug use: No     Allergies   Patient has no known allergies.   Review of Systems Review of Systems  Unable to perform ROS: Severe respiratory distress     Physical Exam Updated Vital Signs BP (!) 147/84   Pulse 85   Temp 97.8 F (36.6 C) (Oral)   Resp 17   Wt 136.1 kg   SpO2 98%   BMI 43.05 kg/m   Physical Exam  Constitutional: He is oriented to person, place, and time. He appears well-developed.  HENT:  Head: Atraumatic.  Eyes: EOM are normal.  Neck: No JVD present.    Cardiovascular:  Tachycardia  Pulmonary/Chest: He is in respiratory distress. He has wheezes. He has rales.  Musculoskeletal: He exhibits no edema or tenderness.  Neurological: He is alert and oriented to person, place, and time.  Skin: Skin is warm.  Nursing note and vitals reviewed.    ED Treatments / Results  Labs (all labs ordered are listed, but only abnormal results are displayed) Labs Reviewed  BASIC METABOLIC PANEL - Abnormal; Notable for the following components:      Result Value   Potassium 3.4 (*)    CO2 19 (*)    Glucose, Bld 218 (*)    Creatinine, Ser 1.30 (*)    GFR calc non Af Amer 60 (*)    Anion gap 18 (*)    All other components within normal limits  BRAIN NATRIURETIC PEPTIDE - Abnormal; Notable for the following components:   B Natriuretic Peptide 104.4 (*)    All other components within normal limits  TROPONIN I - Abnormal; Notable for the following components:   Troponin I 0.03 (*)    All other components within normal limits  CBC WITH DIFFERENTIAL/PLATELET - Abnormal; Notable for the following components:   WBC 12.7 (*)    MCHC 29.5 (*)    Lymphs Abs 5.0 (*)    Monocytes Absolute 1.4 (*)    Abs Immature Granulocytes 0.14 (*)    All other components within normal limits  D-DIMER, QUANTITATIVE (NOT AT [facility]) - Abnormal; Notable for the following components:   D-Dimer, Quant 1.81 (*)    All other components within normal limits  I-STAT ARTERIAL BLOOD GAS, ED - Abnormal; Notable for the following components:   pH, Arterial 7.338 (*)    pO2, Arterial 286.0 (*)    All other components within normal limits  I-STAT CHEM 8, ED - Abnormal; Notable for the following components:   BUN 26 (*)    Creatinine, Ser 1.30 (*)    Glucose, Bld 103 (*)    Calcium, Ion 1.10 (*)    All other components within normal limits  I-STAT TROPONIN, ED - Abnormal; Notable for the following components:   Troponin i, poc 0.39 (*)    All other components within normal limits   MAGNESIUM  PROTIME-INR  RAPID URINE DRUG SCREEN, HOSP PERFORMED  BLOOD GAS, ARTERIAL  TROPONIN I  HEPARIN LEVEL (UNFRACTIONATED)  I-STAT TROPONIN, ED    EKG EKG Interpretation  Date/Time:  Wednesday February 01 2018 23:00:04 EST Ventricular Rate:  134 PR Interval:    QRS Duration: 135 QT Interval:  378 QTC Calculation: 565 R Axis:   -68 Text Interpretation:  Ectopic atrial tachycardia, unifocal Ventricular premature complex Nonspecific IVCD with LAD Left ventricular hypertrophy Inferior infarct,  acute Extensive anterior infarct, acute (LAD) Confirmed by Varney Biles [phone]) on 02/01/2018 11:19:13 PM   Radiology Ct Angio Chest Pe W And/or Wo Contrast  Result Date: 02/02/2018 CLINICAL DATA:  57 year old male with respiratory distress. Concern for pulmonary embolism. EXAM: CT ANGIOGRAPHY CHEST WITH CONTRAST TECHNIQUE: Multidetector CT imaging of the chest was performed using the standard protocol during bolus administration of intravenous contrast. Multiplanar CT image reconstructions and MIPs were obtained to evaluate the vascular anatomy. CONTRAST:  163mL ISOVUE-370 IOPAMIDOL (ISOVUE-370) INJECTION 76% COMPARISON:  Chest radiograph dated 02/01/2018 FINDINGS: Cardiovascular: There is borderline cardiomegaly. There is retrograde flow of contrast from the right atrium into the IVC suggestive of a degree of right heart dysfunction. No pericardial effusion. The thoracic aorta is unremarkable. No CT evidence of pulmonary embolism. Mediastinum/Nodes: No hilar or mediastinal adenopathy. The esophagus is grossly unremarkable. No mediastinal fluid collection. Lungs/Pleura: There is diffuse ground-glass airspace density throughout the lungs which may represent pneumonia, alveolar edema, or ARDS. Clinical correlation is recommended. There is no pleural effusion or pneumothorax. The central airways are patent. Upper Abdomen: No acute abnormality. Musculoskeletal: No chest wall abnormality. No acute  or significant osseous findings. Review of the MIP images confirms the above findings. IMPRESSION: 1. No CT evidence of pulmonary embolism. 2. Diffuse ground-glass airspace density throughout the lungs may represent pneumonia, alveolar edema, or ARDS. Clinical correlation is recommended. Electronically Signed   By: Anner Crete M.D.   On: 02/02/2018 03:18   Dg Chest Portable 1 View  Result Date: 02/01/2018 CLINICAL DATA:  Shortness of breath EXAM: PORTABLE CHEST 1 VIEW COMPARISON:  11/02/2017 CT and radiograph FINDINGS: Cardiomegaly with vascular congestion. Diffuse bilateral hazy and ground-glass opacity, may reflect pulmonary edema. Patchy more confluent airspace disease at the right infrahilar lung and left base. No pneumothorax. IMPRESSION: 1. Cardiomegaly with vascular congestion and diffuse bilateral hazy and ground-glass density suspect for pulmonary edema. 2. More confluent areas of airspace disease within the left base and right infrahilar lung may reflect superimposed pneumonia Electronically Signed   By: Donavan Foil M.D.   On: 02/01/2018 23:23    Procedures .Critical Care Performed by: Varney Biles, MD Authorized by: Varney Biles, MD   Critical care provider statement:    Critical care time (minutes):  56   Critical care start time:  02/01/2018 11:00 PM   Critical care end time:  02/02/2018 3:28 AM   Critical care was necessary to treat or prevent imminent or life-threatening deterioration of the following conditions:  Respiratory failure   Critical care was time spent personally by me on the following activities:  Discussions with consultants, evaluation of patient's response to treatment, examination of patient, ordering and performing treatments and interventions, ordering and review of laboratory studies, ordering and review of radiographic studies, pulse oximetry, re-evaluation of patient's condition, obtaining history from patient or surrogate and review of old  charts   (including critical care time)  Medications Ordered in ED Medications  nitroGLYCERIN (NITROGLYN) 2 % ointment 1 inch (1 inch Topical Given 02/01/18 2322)  heparin bolus via infusion 4,000 Units (has no administration in time range)  heparin ADULT infusion 100 units/mL (25000 units/269mL sodium chloride 0.45%) (has no administration in time range)  aspirin chewable tablet 324 mg (has no administration in time range)  morphine 4 MG/ML injection 6 mg (6 mg Intravenous Given 02/02/18 0005)  iopamidol (ISOVUE-370) 76 % injection 100 mL (100 mLs Intravenous Contrast Given 02/02/18 0308)  furosemide (LASIX) injection 40 mg (40 mg Intravenous Given  02/02/18 7322)     Initial Impression / Assessment and Plan / ED Course  I have reviewed the triage vital signs and the nursing notes.  Pertinent labs & imaging results that were available during my care of the patient were reviewed by me and considered in my medical decision making (see chart for details).  Clinical Course as of Feb 03 443  Thu Feb 02, 2018  0005 X-ray consistent with CHF.  Bedside ultrasound done by my colleague shows multiple B-lines consistent with pulmonary edema. Clinically we have no concerns for pneumonia at this time.   DG Chest Portable 1 View [AN]  0302 BNP is only mildly elevated.  D-dimer is also elevated and we will get a CT angios.  B Natriuretic Peptide(!): 104.4 [AN]  0303 Patient has been reassessed multiple times.  His hemodynamics improved tremendously after being placed on BiPAP.  We are now trialing to see if he can be taken off of the BiPAP.  CT angios has been ordered.   [AN]  0303 Patient is an gap is slightly elevated.  He has a bicarb of 19.  ABG shows pH of 7.33 -I do not think he is in DKA with a blood sugar of 218.  Likely the anion gap is because of lactic acidosis that could have come about with his increased work of breathing.  Anion gap(!): 18 [AN]  0327 CT does not show any evidence of  PE. Results are equivocal, clinical correlation is consistent with alveolar edema.  We will give him Lasix and not infection.  Stable for admission.  CT Angio Chest PE W and/or Wo Contrast [AN]  0254 Patient is i-STAT troponin on repeat is 0.39.  I discussed the case at around 11:45 PM last night with cardiologist.  The cardiology fellow indicated that the EKG does not truly meet STEMI criteria, and we are agreed that we will page him if the troponin rises.  Patient reassessed.  He denies any new chest pain.  Medicine is admitting the patient, however the cardiology fellow will come and see the patient.  Heparin initiated.  I-Stat Troponin, ED (not at [facility])(!!) [AN]    Clinical Course User Index [AN] Varney Biles, MD    57 year old male with history of hypertension and remote history of PE comes in with chief complaint of respiratory distress.  Appears that patient has been having chest pain on the left side along with the shortness of breath and cough for the past 2 or 3 months.  His symptoms acutely worsened over the past few days and today he went into respiratory distress.  Patient arrives in acute hypoxic respiratory failure.  He has been placed on CPAP.   Given that he is having frothy sputum and is noted to be hypertensive -CHF/pulmonary edema is in the differential for the acute onset dyspnea.  Patient has complained of subjective fevers and sweats, therefore evolving pneumonia is also in the consideration given that patient has had a cough.  Additionally, ACS and PE are also in the differential diagnosis given the left-sided pain and hemoptysis.  Evolving work-up at this time.  Final Clinical Impressions(s) / ED Diagnoses   Final diagnoses:  Acute respiratory failure with hypoxia ([place])  Acute congestive heart failure, unspecified heart failure type [facility])    ED Discharge Orders    None       Varney Biles, MD 02/02/18 0002    Varney Biles, MD 02/02/18  [phone]

## 2018-02-02 ENCOUNTER — Emergency Department (HOSPITAL_COMMUNITY): Payer: Medicaid Other

## 2018-02-02 ENCOUNTER — Encounter (HOSPITAL_COMMUNITY): Payer: Self-pay | Admitting: Internal Medicine

## 2018-02-02 ENCOUNTER — Inpatient Hospital Stay (HOSPITAL_COMMUNITY): Payer: Medicaid Other

## 2018-02-02 ENCOUNTER — Other Ambulatory Visit: Payer: Self-pay

## 2018-02-02 DIAGNOSIS — R079 Chest pain, unspecified: Secondary | ICD-10-CM

## 2018-02-02 DIAGNOSIS — J9601 Acute respiratory failure with hypoxia: Secondary | ICD-10-CM | POA: Diagnosis not present

## 2018-02-02 DIAGNOSIS — I110 Hypertensive heart disease with heart failure: Secondary | ICD-10-CM | POA: Diagnosis not present

## 2018-02-02 DIAGNOSIS — E785 Hyperlipidemia, unspecified: Secondary | ICD-10-CM | POA: Diagnosis not present

## 2018-02-02 DIAGNOSIS — Z9079 Acquired absence of other genital organ(s): Secondary | ICD-10-CM | POA: Diagnosis not present

## 2018-02-02 DIAGNOSIS — I5033 Acute on chronic diastolic (congestive) heart failure: Secondary | ICD-10-CM | POA: Diagnosis present

## 2018-02-02 DIAGNOSIS — R7989 Other specified abnormal findings of blood chemistry: Secondary | ICD-10-CM | POA: Diagnosis not present

## 2018-02-02 DIAGNOSIS — R825 Elevated urine levels of drugs, medicaments and biological substances: Secondary | ICD-10-CM | POA: Diagnosis not present

## 2018-02-02 DIAGNOSIS — I10 Essential (primary) hypertension: Secondary | ICD-10-CM | POA: Diagnosis not present

## 2018-02-02 DIAGNOSIS — I248 Other forms of acute ischemic heart disease: Secondary | ICD-10-CM | POA: Diagnosis not present

## 2018-02-02 DIAGNOSIS — Z86711 Personal history of pulmonary embolism: Secondary | ICD-10-CM | POA: Diagnosis not present

## 2018-02-02 DIAGNOSIS — Z7151 Drug abuse counseling and surveillance of drug abuser: Secondary | ICD-10-CM | POA: Diagnosis not present

## 2018-02-02 DIAGNOSIS — E1165 Type 2 diabetes mellitus with hyperglycemia: Secondary | ICD-10-CM | POA: Diagnosis present

## 2018-02-02 DIAGNOSIS — N179 Acute kidney failure, unspecified: Secondary | ICD-10-CM | POA: Diagnosis present

## 2018-02-02 DIAGNOSIS — Z79899 Other long term (current) drug therapy: Secondary | ICD-10-CM | POA: Diagnosis not present

## 2018-02-02 DIAGNOSIS — I509 Heart failure, unspecified: Secondary | ICD-10-CM | POA: Diagnosis not present

## 2018-02-02 DIAGNOSIS — Z833 Family history of diabetes mellitus: Secondary | ICD-10-CM | POA: Diagnosis not present

## 2018-02-02 DIAGNOSIS — K219 Gastro-esophageal reflux disease without esophagitis: Secondary | ICD-10-CM | POA: Diagnosis present

## 2018-02-02 DIAGNOSIS — Z6841 Body Mass Index (BMI) 40.0 and over, adult: Secondary | ICD-10-CM | POA: Diagnosis not present

## 2018-02-02 DIAGNOSIS — F17210 Nicotine dependence, cigarettes, uncomplicated: Secondary | ICD-10-CM | POA: Diagnosis present

## 2018-02-02 DIAGNOSIS — J45909 Unspecified asthma, uncomplicated: Secondary | ICD-10-CM | POA: Diagnosis present

## 2018-02-02 DIAGNOSIS — N400 Enlarged prostate without lower urinary tract symptoms: Secondary | ICD-10-CM | POA: Diagnosis present

## 2018-02-02 DIAGNOSIS — I2510 Atherosclerotic heart disease of native coronary artery without angina pectoris: Secondary | ICD-10-CM | POA: Diagnosis not present

## 2018-02-02 DIAGNOSIS — R0789 Other chest pain: Secondary | ICD-10-CM | POA: Diagnosis not present

## 2018-02-02 DIAGNOSIS — I160 Hypertensive urgency: Secondary | ICD-10-CM | POA: Diagnosis present

## 2018-02-02 DIAGNOSIS — Z720 Tobacco use: Secondary | ICD-10-CM | POA: Diagnosis not present

## 2018-02-02 DIAGNOSIS — F1190 Opioid use, unspecified, uncomplicated: Secondary | ICD-10-CM | POA: Diagnosis present

## 2018-02-02 DIAGNOSIS — F1290 Cannabis use, unspecified, uncomplicated: Secondary | ICD-10-CM | POA: Diagnosis present

## 2018-02-02 DIAGNOSIS — R0602 Shortness of breath: Secondary | ICD-10-CM | POA: Diagnosis not present

## 2018-02-02 DIAGNOSIS — Z23 Encounter for immunization: Secondary | ICD-10-CM | POA: Diagnosis not present

## 2018-02-02 DIAGNOSIS — J811 Chronic pulmonary edema: Secondary | ICD-10-CM | POA: Diagnosis not present

## 2018-02-02 DIAGNOSIS — R0603 Acute respiratory distress: Secondary | ICD-10-CM | POA: Diagnosis not present

## 2018-02-02 DIAGNOSIS — G629 Polyneuropathy, unspecified: Secondary | ICD-10-CM | POA: Diagnosis present

## 2018-02-02 DIAGNOSIS — I5031 Acute diastolic (congestive) heart failure: Secondary | ICD-10-CM | POA: Diagnosis not present

## 2018-02-02 LAB — CBC WITH DIFFERENTIAL/PLATELET
Abs Immature Granulocytes: 0.14 10*3/uL — ABNORMAL HIGH (ref 0.00–0.07)
Abs Immature Granulocytes: 0.06 10*3/uL (ref 0.00–0.07)
Basophils Absolute: 0 10*3/uL (ref 0.0–0.1)
Basophils Absolute: 0 10*3/uL (ref 0.0–0.1)
Basophils Relative: 0 %
Basophils Relative: 0 %
EOS PCT: 2 %
Eosinophils Absolute: 0.2 10*3/uL (ref 0.0–0.5)
Eosinophils Absolute: 0.1 10*3/uL (ref 0.0–0.5)
Eosinophils Relative: 0 %
HCT: 50.9 % (ref 39.0–52.0)
HCT: 43.3 % (ref 39.0–52.0)
Hemoglobin: 15 g/dL (ref 13.0–17.0)
Hemoglobin: 13.5 g/dL (ref 13.0–17.0)
Immature Granulocytes: 1 %
Immature Granulocytes: 1 %
LYMPHS PCT: 15 %
Lymphocytes Relative: 39 %
Lymphs Abs: 5 10*3/uL — ABNORMAL HIGH (ref 0.7–4.0)
Lymphs Abs: 1.7 10*3/uL (ref 0.7–4.0)
MCH: 28.3 pg (ref 26.0–34.0)
MCH: 28.8 pg (ref 26.0–34.0)
MCHC: 29.5 g/dL — ABNORMAL LOW (ref 30.0–36.0)
MCHC: 31.2 g/dL (ref 30.0–36.0)
MCV: 96 fL (ref 80.0–100.0)
MCV: 92.5 fL (ref 80.0–100.0)
Monocytes Absolute: 1.4 10*3/uL — ABNORMAL HIGH (ref 0.1–1.0)
Monocytes Absolute: 0.7 10*3/uL (ref 0.1–1.0)
Monocytes Relative: 11 %
Monocytes Relative: 6 %
Neutro Abs: 6 10*3/uL (ref 1.7–7.7)
Neutro Abs: 9.3 10*3/uL — ABNORMAL HIGH (ref 1.7–7.7)
Neutrophils Relative %: 47 %
Neutrophils Relative %: 78 %
Platelets: 319 10*3/uL (ref 150–400)
Platelets: 258 10*3/uL (ref 150–400)
RBC: 5.3 MIL/uL (ref 4.22–5.81)
RBC: 4.68 MIL/uL (ref 4.22–5.81)
RDW: 15.3 % (ref 11.5–15.5)
RDW: 15.3 % (ref 11.5–15.5)
WBC: 12.7 10*3/uL — ABNORMAL HIGH (ref 4.0–10.5)
WBC: 11.8 10*3/uL — ABNORMAL HIGH (ref 4.0–10.5)
nRBC: 0 % (ref 0.0–0.2)
nRBC: 0.2 % (ref 0.0–0.2)

## 2018-02-02 LAB — COMPREHENSIVE METABOLIC PANEL
ALK PHOS: 67 U/L (ref 38–126)
ALT: 58 U/L — AB (ref 0–44)
AST: 39 U/L (ref 15–41)
Albumin: 3.8 g/dL (ref 3.5–5.0)
Anion gap: 12 (ref 5–15)
BUN: 23 mg/dL — ABNORMAL HIGH (ref 6–20)
CHLORIDE: 105 mmol/L (ref 98–111)
CO2: 26 mmol/L (ref 22–32)
Calcium: 8.9 mg/dL (ref 8.9–10.3)
Creatinine, Ser: 1.47 mg/dL — ABNORMAL HIGH (ref 0.61–1.24)
GFR calc Af Amer: 60 mL/min — ABNORMAL LOW (ref >60)
GFR calc non Af Amer: 51 mL/min — ABNORMAL LOW (ref >60)
Glucose, Bld: 146 mg/dL — ABNORMAL HIGH (ref 70–99)
Potassium: 4.5 mmol/L (ref 3.5–5.1)
Sodium: 143 mmol/L (ref 135–145)
Total Bilirubin: 0.9 mg/dL (ref 0.3–1.2)
Total Protein: 6.7 g/dL (ref 6.5–8.1)

## 2018-02-02 LAB — MAGNESIUM: Magnesium: 2 mg/dL (ref 1.7–2.4)

## 2018-02-02 LAB — RAPID URINE DRUG SCREEN, HOSP PERFORMED
Amphetamines: NOT DETECTED
Barbiturates: NOT DETECTED
Benzodiazepines: NOT DETECTED
Cocaine: POSITIVE — AB
Opiates: POSITIVE — AB
Tetrahydrocannabinol: NOT DETECTED

## 2018-02-02 LAB — HEMOGLOBIN A1C
Hgb A1c MFr Bld: 5.7 % — ABNORMAL HIGH (ref 4.8–5.6)
Mean Plasma Glucose: 116.89 mg/dL

## 2018-02-02 LAB — BASIC METABOLIC PANEL
ANION GAP: 18 — AB (ref 5–15)
BUN: 20 mg/dL (ref 6–20)
CALCIUM: 9.1 mg/dL (ref 8.9–10.3)
CHLORIDE: 104 mmol/L (ref 98–111)
CO2: 19 mmol/L — AB (ref 22–32)
Creatinine, Ser: 1.3 mg/dL — ABNORMAL HIGH (ref 0.61–1.24)
GFR calc Af Amer: >60 mL/min (ref >60)
GFR calc non Af Amer: 60 mL/min — ABNORMAL LOW (ref >60)
Glucose, Bld: 218 mg/dL — ABNORMAL HIGH (ref 70–99)
Potassium: 3.4 mmol/L — ABNORMAL LOW (ref 3.5–5.1)
Sodium: 141 mmol/L (ref 135–145)

## 2018-02-02 LAB — I-STAT CHEM 8, ED
BUN: 26 mg/dL — ABNORMAL HIGH (ref 6–20)
CREATININE: 1.3 mg/dL — AB (ref 0.61–1.24)
Calcium, Ion: 1.1 mmol/L — ABNORMAL LOW (ref 1.15–1.40)
Chloride: 106 mmol/L (ref 98–111)
Glucose, Bld: 103 mg/dL — ABNORMAL HIGH (ref 70–99)
HCT: 41 % (ref 39.0–52.0)
Hemoglobin: 13.9 g/dL (ref 13.0–17.0)
Potassium: 4.8 mmol/L (ref 3.5–5.1)
Sodium: 139 mmol/L (ref 135–145)
TCO2: 27 mmol/L (ref 22–32)

## 2018-02-02 LAB — PROTIME-INR
INR: 1.09
Prothrombin Time: 14 seconds (ref 11.4–15.2)

## 2018-02-02 LAB — BRAIN NATRIURETIC PEPTIDE: B Natriuretic Peptide: 104.4 pg/mL — ABNORMAL HIGH (ref 0.0–100.0)

## 2018-02-02 LAB — I-STAT TROPONIN, ED
Troponin i, poc: 0.04 ng/mL (ref 0.00–0.08)
Troponin i, poc: 0.39 ng/mL (ref 0.00–0.08)

## 2018-02-02 LAB — D-DIMER, QUANTITATIVE: D-Dimer, Quant: 1.81 ug/mL-FEU — ABNORMAL HIGH (ref 0.00–0.50)

## 2018-02-02 LAB — TROPONIN I
TROPONIN I: 0.46 ng/mL — AB (ref <0.03)
TROPONIN I: 0.6 ng/mL — AB (ref <0.03)
Troponin I: 0.03 ng/mL (ref <0.03)
Troponin I: 0.34 ng/mL (ref <0.03)
Troponin I: 0.38 ng/mL (ref <0.03)

## 2018-02-02 LAB — TSH: TSH: 1.552 u[IU]/mL (ref 0.350–4.500)

## 2018-02-02 LAB — HIV ANTIBODY (ROUTINE TESTING W REFLEX): HIV Screen 4th Generation wRfx: NONREACTIVE

## 2018-02-02 LAB — HEPARIN LEVEL (UNFRACTIONATED): Heparin Unfractionated: 0.43 IU/mL (ref 0.30–0.70)

## 2018-02-02 MED ORDER — NICOTINE 14 MG/24HR TD PT24
14.0000 mg | MEDICATED_PATCH | Freq: Every day | TRANSDERMAL | Status: DC
  Administered 2018-02-02 – 2018-02-07 (×6): 14 mg via TRANSDERMAL
  Filled 2018-02-02 (×6): qty 1

## 2018-02-02 MED ORDER — ACETAMINOPHEN 650 MG RE SUPP: 650.0000 mg | Freq: Four times a day (QID) | RECTAL | Status: DC | PRN

## 2018-02-02 MED ORDER — ACETAMINOPHEN 325 MG PO TABS
650.0000 mg | ORAL_TABLET | Freq: Four times a day (QID) | ORAL | Status: DC | PRN
  Administered 2018-02-03 – 2018-02-05 (×5): 650 mg via ORAL
  Filled 2018-02-02 (×5): qty 2

## 2018-02-02 MED ORDER — HEPARIN (PORCINE) 25000 UT/250ML-% IV SOLN
1800.0000 [IU]/h | INTRAVENOUS | Status: DC
  Administered 2018-02-02 – 2018-02-04 (×4): 1650 [IU]/h via INTRAVENOUS
  Administered 2018-02-04 – 2018-02-05 (×4): 1800 [IU]/h via INTRAVENOUS
  Filled 2018-02-02 (×7): qty 250

## 2018-02-02 MED ORDER — FUROSEMIDE 10 MG/ML IJ SOLN
40.0000 mg | Freq: Once | INTRAMUSCULAR | Status: AC
  Administered 2018-02-02: 40 mg via INTRAVENOUS
  Filled 2018-02-02: qty 4

## 2018-02-02 MED ORDER — HEPARIN BOLUS VIA INFUSION
4000.0000 [IU] | Freq: Once | INTRAVENOUS | Status: AC
  Administered 2018-02-02: 4000 [IU] via INTRAVENOUS
  Filled 2018-02-02: qty 4000

## 2018-02-02 MED ORDER — ASPIRIN EC 81 MG PO TBEC
81.0000 mg | DELAYED_RELEASE_TABLET | Freq: Every day | ORAL | Status: DC
  Administered 2018-02-02 – 2018-02-07 (×6): 81 mg via ORAL
  Filled 2018-02-02 (×6): qty 1

## 2018-02-02 MED ORDER — ONDANSETRON HCL 4 MG PO TABS: 4.0000 mg | ORAL_TABLET | Freq: Four times a day (QID) | ORAL | Status: DC | PRN

## 2018-02-02 MED ORDER — INFLUENZA VAC SPLIT QUAD 0.5 ML IM SUSY
0.5000 mL | PREFILLED_SYRINGE | INTRAMUSCULAR | Status: AC
  Administered 2018-02-03: 0.5 mL via INTRAMUSCULAR
  Filled 2018-02-02: qty 0.5

## 2018-02-02 MED ORDER — PNEUMOCOCCAL VAC POLYVALENT 25 MCG/0.5ML IJ INJ
0.5000 mL | INJECTION | INTRAMUSCULAR | Status: AC
  Administered 2018-02-03: 0.5 mL via INTRAMUSCULAR
  Filled 2018-02-02: qty 0.5

## 2018-02-02 MED ORDER — FUROSEMIDE 10 MG/ML IJ SOLN
40.0000 mg | Freq: Two times a day (BID) | INTRAMUSCULAR | Status: DC
  Administered 2018-02-02 – 2018-02-03 (×3): 40 mg via INTRAVENOUS
  Filled 2018-02-02 (×3): qty 4

## 2018-02-02 MED ORDER — ASPIRIN 81 MG PO CHEW
324.0000 mg | CHEWABLE_TABLET | Freq: Once | ORAL | Status: AC
  Administered 2018-02-02: 324 mg via ORAL
  Filled 2018-02-02: qty 4

## 2018-02-02 MED ORDER — ATORVASTATIN CALCIUM 10 MG PO TABS
10.0000 mg | ORAL_TABLET | Freq: Every day | ORAL | Status: DC
  Administered 2018-02-02 – 2018-02-05 (×4): 10 mg via ORAL
  Filled 2018-02-02 (×4): qty 1

## 2018-02-02 MED ORDER — GABAPENTIN 300 MG PO CAPS
300.0000 mg | ORAL_CAPSULE | Freq: Three times a day (TID) | ORAL | Status: DC
  Administered 2018-02-02 – 2018-02-07 (×16): 300 mg via ORAL
  Filled 2018-02-02 (×16): qty 1

## 2018-02-02 MED ORDER — IOPAMIDOL (ISOVUE-370) INJECTION 76%
100.0000 mL | Freq: Once | INTRAVENOUS | Status: AC | PRN
  Administered 2018-02-02: 100 mL via INTRAVENOUS

## 2018-02-02 MED ORDER — ENALAPRIL MALEATE 20 MG PO TABS
40.0000 mg | ORAL_TABLET | Freq: Every day | ORAL | Status: DC
  Administered 2018-02-02 – 2018-02-06 (×5): 40 mg via ORAL
  Filled 2018-02-02 (×8): qty 2

## 2018-02-02 MED ORDER — ONDANSETRON HCL 4 MG/2ML IJ SOLN: 4.0000 mg | Freq: Four times a day (QID) | INTRAMUSCULAR | Status: DC | PRN

## 2018-02-02 MED ORDER — CARVEDILOL 3.125 MG PO TABS
3.1250 mg | ORAL_TABLET | Freq: Two times a day (BID) | ORAL | Status: DC
  Administered 2018-02-02 – 2018-02-04 (×5): 3.125 mg via ORAL
  Filled 2018-02-02 (×7): qty 1

## 2018-02-02 MED ORDER — IOPAMIDOL (ISOVUE-370) INJECTION 76%
INTRAVENOUS | Status: AC
  Filled 2018-02-02: qty 100

## 2018-02-02 NOTE — ED Notes (Signed)
Ordered breakfast tray, ordered heart healthy

## 2018-02-02 NOTE — ED Notes (Signed)
ED TO INPATIENT HANDOFF REPORT  ED Nurse Name and Phone #:  Debbra Riding [phone]  Name/Age/Gender Russell Collier 57 y.o. male Room/Bed: TRACC/TRACC  Code Status   Code Status: Full Code  Home/SNF/Other Home Patient oriented to: self, place, time and situation Is this baseline? Yes   Triage Complete: Triage complete   Chief Complaint Pulmonary edema on BiPap  Triage Note Patient BIB [facility] for respiratory distress. Tripod on EMS arrival with pink frothy sputum. O2 70% initially and   88% on CPAP. Unable to obtain history from family or patient.    HR 120  18g RIGHT hand   Allergies No Known Allergies  Level of Care/Admitting Diagnosis ED Disposition    ED Disposition Condition [facility]: [place] [[id number]]  Level of Care: Cardiac Telemetry [103]  Diagnosis: Acute respiratory failure with hypoxia [facility]) [[id number]]  Admitting Physician: Desiree Hane [[id number]]  Attending Physician: Desiree Hane [phone]  Estimated length of stay: past midnight tomorrow  Certification:: I certify this patient will need inpatient services for at least 2 midnights  PT Class (Do Not Modify): Inpatient [101]  PT Acc Code (Do Not Modify): Private [1]       Medical/Surgery History Past Medical History:  Diagnosis Date  . Hypertension   . Neuropathy   . Pulmonary embolism [facility])    Past Surgical History:  Procedure Laterality Date  . PROSTATE SURGERY    . TRANSURETHRAL RESECTION OF PROSTATE       IV Location/Drains/Wounds Patient Lines/Drains/Airways Status   Active Line/Drains/Airways    Name:   Placement date:   Placement time:   Site:   Days:   Peripheral IV 02/02/18 Left Hand   02/02/18    0000    Hand   less than 1   Peripheral IV 02/02/18 Left Antecubital   02/02/18    0220    Antecubital   less than 1          Intake/Output Last 24 hours  Intake/Output Summary (Last 24 hours) at 02/02/2018 1116 Last data filed at  02/02/2018 0941 Gross per 24 hour  Intake -  Output 1500 ml  Net -1500 ml    Labs/Imaging Results for orders placed or performed during the hospital encounter of 02/01/18 (from the past 48 hour(s))  I-Stat arterial blood gas, ED     Status: Abnormal   Collection Time: 02/01/18 11:23 PM  Result Value Ref Range   pH, Arterial 7.338 (L) 7.350 - 7.450   pCO2 arterial 44.7 32.0 - 48.0 mmHg   pO2, Arterial 286.0 (H) 83.0 - 108.0 mmHg   Bicarbonate 24.0 20.0 - 28.0 mmol/L   TCO2 25 22 - 32 mmol/L   O2 Saturation 100.0 %   Acid-base deficit 2.0 0.0 - 2.0 mmol/L   Patient temperature 98.6 F    Collection site FEMORAL ARTERY    Sample type ARTERIAL   Basic metabolic panel     Status: Abnormal   Collection Time: 02/01/18 11:28 PM  Result Value Ref Range   Sodium 141 135 - 145 mmol/L   Potassium 3.4 (L) 3.5 - 5.1 mmol/L   Chloride 104 98 - 111 mmol/L   CO2 19 (L) 22 - 32 mmol/L   Glucose, Bld 218 (H) 70 - 99 mg/dL   BUN 20 6 - 20 mg/dL   Creatinine, Ser 1.30 (H) 0.61 - 1.24 mg/dL   Calcium 9.1 8.9 - 10.3 mg/dL   GFR calc non  Af Amer 60 (L) >60 mL/min   GFR calc Af Amer >60 >60 mL/min   Anion gap 18 (H) 5 - 15    Comment: Performed at [facility] [address], [place], [place] [address]  Magnesium     Status: None   Collection Time: 02/01/18 11:28 PM  Result Value Ref Range   Magnesium 2.0 1.7 - 2.4 mg/dL    Comment: Performed at [facility], [place] [address], [place], [place] [address]  Brain natriuretic peptide (order ONLY if patient c/o SOB)     Status: Abnormal   Collection Time: 02/01/18 11:28 PM  Result Value Ref Range   B Natriuretic Peptide 104.4 (H) 0.0 - 100.0 pg/mL    Comment: Performed at [facility] [address] [place], [place] [address]  Troponin I - ONCE - STAT     Status: Abnormal   Collection Time: 02/01/18 11:28 PM  Result Value Ref Range   Troponin I 0.03 (HH) <0.03 ng/mL    Comment: CRITICAL RESULT CALLED TO, READ BACK BY AND  VERIFIED WITH: OLDLAND,B RN 02/02/2018 0042 JORDANS Performed at [facility], [place] [address], [place], [place] [address]   CBC with Differential/Platelet     Status: Abnormal   Collection Time: 02/01/18 11:28 PM  Result Value Ref Range   WBC 12.7 (H) 4.0 - 10.5 K/uL   RBC 5.30 4.22 - 5.81 MIL/uL   Hemoglobin 15.0 13.0 - 17.0 g/dL   HCT 50.9 39.0 - 52.0 %   MCV 96.0 80.0 - 100.0 fL   MCH 28.3 26.0 - 34.0 pg   MCHC 29.5 (L) 30.0 - 36.0 g/dL   RDW 15.3 11.5 - 15.5 %   Platelets 319 150 - 400 K/uL   nRBC 0.0 0.0 - 0.2 %   Neutrophils Relative % 47 %   Neutro Abs 6.0 1.7 - 7.7 K/uL   Lymphocytes Relative 39 %   Lymphs Abs 5.0 (H) 0.7 - 4.0 K/uL   Monocytes Relative 11 %   Monocytes Absolute 1.4 (H) 0.1 - 1.0 K/uL   Eosinophils Relative 2 %   Eosinophils Absolute 0.2 0.0 - 0.5 K/uL   Basophils Relative 0 %   Basophils Absolute 0.0 0.0 - 0.1 K/uL   Immature Granulocytes 1 %   Abs Immature Granulocytes 0.14 (H) 0.00 - 0.07 K/uL    Comment: Performed at [facility], [address] [place], [place] [address]  Protime-INR     Status: None   Collection Time: 02/01/18 11:28 PM  Result Value Ref Range   Prothrombin Time 14.0 11.4 - 15.2 seconds   INR 1.09     Comment: Performed at [facility], [place] [address], [place], [place] [address]  D-dimer, quantitative (not at [facility])     Status: Abnormal   Collection Time: 02/01/18 11:28 PM  Result Value Ref Range   D-Dimer, Quant 1.81 (H) 0.00 - 0.50 ug/mL-FEU    Comment: (NOTE) At the manufacturer cut-off of 0.50 ug/mL FEU, this assay has been documented to exclude PE with a sensitivity and negative predictive value of 97 to 99%.  At this time, this assay has not been approved by the FDA to exclude DVT/VTE. Results should be correlated with clinical presentation. Performed at [facility], [place] [address], [place], [place] [address]   I-stat troponin, ED     Status: None   Collection Time: 02/02/18 12:05 AM   Result Value  Ref Range   Troponin i, poc 0.04 0.00 - 0.08 ng/mL   Comment 3            Comment: Due to the release kinetics of cTnI, a negative result within the first hours of the onset of symptoms does not rule out myocardial infarction with certainty. If myocardial infarction is still suspected, repeat the test at appropriate intervals.   I-Stat Troponin, ED (not at [facility])     Status: Abnormal   Collection Time: 02/02/18  3:41 AM  Result Value Ref Range   Troponin i, poc 0.39 (HH) 0.00 - 0.08 ng/mL   Comment NOTIFIED PHYSICIAN    Comment 3            Comment: Due to the release kinetics of cTnI, a negative result within the first hours of the onset of symptoms does not rule out myocardial infarction with certainty. If myocardial infarction is still suspected, repeat the test at appropriate intervals.   I-stat chem 8, ed     Status: Abnormal   Collection Time: 02/02/18  3:43 AM  Result Value Ref Range   Sodium 139 135 - 145 mmol/L   Potassium 4.8 3.5 - 5.1 mmol/L   Chloride 106 98 - 111 mmol/L   BUN 26 (H) 6 - 20 mg/dL   Creatinine, Ser 1.30 (H) 0.61 - 1.24 mg/dL   Glucose, Bld 103 (H) 70 - 99 mg/dL   Calcium, Ion 1.10 (L) 1.15 - 1.40 mmol/L   TCO2 27 22 - 32 mmol/L   Hemoglobin 13.9 13.0 - 17.0 g/dL   HCT 41.0 39.0 - 52.0 %  Troponin I - ONCE - STAT     Status: Abnormal   Collection Time: 02/02/18  3:57 AM  Result Value Ref Range   Troponin I 0.34 (HH) <0.03 ng/mL    Comment: CRITICAL RESULT CALLED TO, READ BACK BY AND VERIFIED WITH: OLDLAND,B RN 02/02/2018 0507 JORDANS Performed at [facility], [place] [address] [place], [place] [address]   Comprehensive metabolic panel     Status: Abnormal   Collection Time: 02/02/18  5:24 AM  Result Value Ref Range   Sodium 143 135 - 145 mmol/L   Potassium 4.5 3.5 - 5.1 mmol/L   Chloride 105 98 - 111 mmol/L   CO2 26 22 - 32 mmol/L   Glucose, Bld 146 (H) 70 - 99 mg/dL   BUN 23 (H) 6 - 20 mg/dL   Creatinine, Ser 1.47 (H)  0.61 - 1.24 mg/dL   Calcium 8.9 8.9 - 10.3 mg/dL   Total Protein 6.7 6.5 - 8.1 g/dL   Albumin 3.8 3.5 - 5.0 g/dL   AST 39 15 - 41 U/L   ALT 58 (H) 0 - 44 U/L   Alkaline Phosphatase 67 38 - 126 U/L   Total Bilirubin 0.9 0.3 - 1.2 mg/dL   GFR calc non Af Amer 51 (L) >60 mL/min   GFR calc Af Amer 60 (L) >60 mL/min   Anion gap 12 5 - 15    Comment: Performed at [facility] [address] [place], [place] [address]  CBC WITH DIFFERENTIAL     Status: Abnormal   Collection Time: 02/02/18  5:24 AM  Result Value Ref Range   WBC 11.8 (H) 4.0 - 10.5 K/uL   RBC 4.68 4.22 - 5.81 MIL/uL   Hemoglobin 13.5 13.0 - 17.0 g/dL   HCT 43.3 39.0 - 52.0 %   MCV 92.5 80.0 - 100.0 fL  MCH 28.8 26.0 - 34.0 pg   MCHC 31.2 30.0 - 36.0 g/dL   RDW 15.3 11.5 - 15.5 %   Platelets 258 150 - 400 K/uL   nRBC 0.2 0.0 - 0.2 %   Neutrophils Relative % 78 %   Neutro Abs 9.3 (H) 1.7 - 7.7 K/uL   Lymphocytes Relative 15 %   Lymphs Abs 1.7 0.7 - 4.0 K/uL   Monocytes Relative 6 %   Monocytes Absolute 0.7 0.1 - 1.0 K/uL   Eosinophils Relative 0 %   Eosinophils Absolute 0.1 0.0 - 0.5 K/uL   Basophils Relative 0 %   Basophils Absolute 0.0 0.0 - 0.1 K/uL   Immature Granulocytes 1 %   Abs Immature Granulocytes 0.06 0.00 - 0.07 K/uL    Comment: Performed at [facility] [address] [place], [place] [address]  TSH     Status: None   Collection Time: 02/02/18  5:24 AM  Result Value Ref Range   TSH 1.552 0.350 - 4.500 uIU/mL    Comment: Performed by a 3rd Generation assay with a functional sensitivity of <=0.01 uIU/mL. Performed at [facility], [place] [address], [place], [place] [address]   Troponin I - Now Then Q6H     Status: Abnormal   Collection Time: 02/02/18  5:24 AM  Result Value Ref Range   Troponin I 0.46 (HH) <0.03 ng/mL    Comment: CRITICAL VALUE NOTED.  VALUE IS CONSISTENT WITH PREVIOUSLY REPORTED AND CALLED VALUE. Performed at [facility], [place] [address] [place],  [place] [address]   Hemoglobin A1c     Status: Abnormal   Collection Time: 02/02/18  5:24 AM  Result Value Ref Range   Hgb A1c MFr Bld 5.7 (H) 4.8 - 5.6 %    Comment: (NOTE) Pre diabetes:          5.7%-6.4% Diabetes:              >6.4% Glycemic control for   <7.0% adults with diabetes    Mean Plasma Glucose 116.89 mg/dL    Comment: Performed at [facility] [address] [place], [place] [address]  Urine rapid drug screen (hosp performed)     Status: Abnormal   Collection Time: 02/02/18  6:12 AM  Result Value Ref Range   Opiates POSITIVE (A) NONE DETECTED   Cocaine POSITIVE (A) NONE DETECTED   Benzodiazepines NONE DETECTED NONE DETECTED   Amphetamines NONE DETECTED NONE DETECTED   Tetrahydrocannabinol NONE DETECTED NONE DETECTED   Barbiturates NONE DETECTED NONE DETECTED    Comment: (NOTE) DRUG SCREEN FOR MEDICAL PURPOSES ONLY.  IF CONFIRMATION IS NEEDED FOR ANY PURPOSE, NOTIFY LAB WITHIN 5 DAYS. LOWEST DETECTABLE LIMITS FOR URINE DRUG SCREEN Drug Class                     Cutoff (ng/mL) Amphetamine and metabolites    1000 Barbiturate and metabolites    200 Benzodiazepine                 419 Tricyclics and metabolites     300 Opiates and metabolites        300 Cocaine and metabolites        300 THC                            50 Performed at [facility], [place] [address] [place], [place]  [address]    Ct Angio Chest Pe W And/or Wo Contrast  Result Date: 02/02/2018 CLINICAL DATA:  57 year old male with respiratory distress. Concern for pulmonary embolism. EXAM: CT ANGIOGRAPHY CHEST WITH CONTRAST TECHNIQUE: Multidetector CT imaging of the chest was performed using the standard protocol during bolus administration of intravenous contrast. Multiplanar CT image reconstructions and MIPs were obtained to evaluate the vascular anatomy. CONTRAST:  115mL ISOVUE-370 IOPAMIDOL (ISOVUE-370) INJECTION 76% COMPARISON:  Chest radiograph dated 02/01/2018 FINDINGS: Cardiovascular:  There is borderline cardiomegaly. There is retrograde flow of contrast from the right atrium into the IVC suggestive of a degree of right heart dysfunction. No pericardial effusion. The thoracic aorta is unremarkable. No CT evidence of pulmonary embolism. Mediastinum/Nodes: No hilar or mediastinal adenopathy. The esophagus is grossly unremarkable. No mediastinal fluid collection. Lungs/Pleura: There is diffuse ground-glass airspace density throughout the lungs which may represent pneumonia, alveolar edema, or ARDS. Clinical correlation is recommended. There is no pleural effusion or pneumothorax. The central airways are patent. Upper Abdomen: No acute abnormality. Musculoskeletal: No chest wall abnormality. No acute or significant osseous findings. Review of the MIP images confirms the above findings. IMPRESSION: 1. No CT evidence of pulmonary embolism. 2. Diffuse ground-glass airspace density throughout the lungs may represent pneumonia, alveolar edema, or ARDS. Clinical correlation is recommended. Electronically Signed   By: Anner Crete M.D.   On: 02/02/2018 03:18   Dg Chest Portable 1 View  Result Date: 02/01/2018 CLINICAL DATA:  Shortness of breath EXAM: PORTABLE CHEST 1 VIEW COMPARISON:  11/02/2017 CT and radiograph FINDINGS: Cardiomegaly with vascular congestion. Diffuse bilateral hazy and ground-glass opacity, may reflect pulmonary edema. Patchy more confluent airspace disease at the right infrahilar lung and left base. No pneumothorax. IMPRESSION: 1. Cardiomegaly with vascular congestion and diffuse bilateral hazy and ground-glass density suspect for pulmonary edema. 2. More confluent areas of airspace disease within the left base and right infrahilar lung may reflect superimposed pneumonia Electronically Signed   By: Donavan Foil M.D.   On: 02/01/2018 23:23    Pending Labs Unresulted Labs (From admission, onward)    Start     Ordered   02/03/18 0500  Heparin level (unfractionated)  Daily,    R     02/02/18 0400   02/03/18 0500  CBC  Daily,   R     02/02/18 0400   02/02/18 1200  Heparin level (unfractionated)  Once-Timed,   R     02/02/18 0600   02/02/18 0510  Troponin I - Now Then Q6H  Now then every 6 hours,   R     02/02/18 0511   02/02/18 0509  HIV antibody (Routine Testing)  Once,   R     02/02/18 0511          Vitals/Pain Today's Vitals   02/02/18 0415 02/02/18 0515 02/02/18 0830 02/02/18 0833  BP: (!) 147/84 (!) 157/75 (!) 130/98   Pulse: 85 80 87   Resp: 17 14 (!) 23   Temp:      TempSrc:      SpO2: 98% 94% 100%   Weight:      PainSc:    0-No pain    Isolation Precautions No active isolations  Medications Medications  nitroGLYCERIN (NITROGLYN) 2 % ointment 1 inch (1 inch Topical Given 02/02/18 0603)  heparin ADULT infusion 100 units/mL (25000 units/252mL sodium chloride 0.45%) (1,650 Units/hr Intravenous New Bag/Given 02/02/18 0603)  atorvastatin (LIPITOR) tablet 10 mg (10 mg Oral Given 02/02/18 0910)  carvedilol (COREG) tablet  3.125 mg (3.125 mg Oral Given 02/02/18 0909)  enalapril (VASOTEC) tablet 40 mg (has no administration in time range)  gabapentin (NEURONTIN) capsule 300 mg (300 mg Oral Given 02/02/18 0910)  acetaminophen (TYLENOL) tablet 650 mg (has no administration in time range)    Or  acetaminophen (TYLENOL) suppository 650 mg (has no administration in time range)  ondansetron (ZOFRAN) tablet 4 mg (has no administration in time range)    Or  ondansetron (ZOFRAN) injection 4 mg (has no administration in time range)  aspirin EC tablet 81 mg (81 mg Oral Given 02/02/18 0910)  furosemide (LASIX) injection 40 mg (40 mg Intravenous Given 02/02/18 0833)  morphine 4 MG/ML injection 6 mg (6 mg Intravenous Given 02/02/18 0005)  iopamidol (ISOVUE-370) 76 % injection 100 mL (100 mLs Intravenous Contrast Given 02/02/18 0308)  furosemide (LASIX) injection 40 mg (40 mg Intravenous Given 02/02/18 0412)  heparin bolus via infusion 4,000 Units (4,000  Units Intravenous Bolus from Bag 02/02/18 0604)  aspirin chewable tablet 324 mg (324 mg Oral Given 02/02/18 0603)    Mobility walks Moderate fall risk   Focused Assessments Cardiac Assessment Handoff:    Lab Results  Component Value Date   TROPONINI 0.46 ([place]) 02/02/2018   Lab Results  Component Value Date   DDIMER 1.81 (H) 02/01/2018   Does the Patient currently have chest pain? No     Recommendations: See Admitting Provider Note  Report given to:   Additional Notes: Pt currently off Bipap since 0700

## 2018-02-02 NOTE — ED Notes (Signed)
Pt requesting to trial off bipap; continually removing bipap to talk to friend at bedside. Pt placed on 3L [place], tolerating cannula well

## 2018-02-02 NOTE — ED Notes (Signed)
Graham crackers, peanut butter, and sprite given

## 2018-02-02 NOTE — ED Notes (Signed)
Message sent to pharmacy for heparin drip
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
## 2018-02-02 NOTE — ED Notes (Signed)
Cardiology at bedside; pt assisted back to chair for comfort

## 2018-02-02 NOTE — H&P (Addendum)
History and Physical    Russell Collier NGE:[id number] DOB: 13-Sep-1960 DOA: 02/01/2018  PCP: [facility]  Patient coming from: Home.  Chief Complaint: Shortness of breath.  HPI: Russell Collier is a 57 y.o. male with history of hypertension, tobacco abuse, bronchitis and previous history of pulmonary embolism presents to the ER because of worsening shortness of breath which acutely worsened last night around 8 PM.  Patient was using inhaler despite which patient's shortness of breath did not improve at that point patient called EMS and was brought to the ER.  Patient also started developing chest pain across the chest during this episode.  Patient states over the last 2 to 3 weeks patient has been getting short of breath off and on with chest pressure which lasted for few minutes each time.  But last night became more persistent and continuous.  ED Course: In the ER patient was found to be hypotensive hypoxic and was placed on BiPAP.  Patient had bilateral lower extremity edema and CT angiogram of the chest shows alveolar edema versus ARDS.  Symptoms are more compatible with CHF and was given Lasix and admitted for acute pulmonary edema and his troponin was elevated cardiology was consulted.  EKG shows sinus rhythm with IVCD.  At the time of my exam patient has been weaned off BiPAP since patient symptoms improved.  Patient also has been started on heparin infusion for elevated troponin in the setting of shortness of breath and chest pain.  Review of Systems: As per HPI, rest all negative.   Past Medical History:  Diagnosis Date  . Hypertension   . Neuropathy   . Pulmonary embolism [facility])     Past Surgical History:  Procedure Laterality Date  . PROSTATE SURGERY    . TRANSURETHRAL RESECTION OF PROSTATE       reports that he has been smoking cigarettes. He has been smoking about 1.00 pack per day. He has never used smokeless tobacco. He reports current alcohol use. He reports  that he does not use drugs.  No Known Allergies  Family History  Problem Relation Age of Onset  . Diabetes Mellitus II Father   . Diabetes Mellitus II Maternal Grandmother     Prior to Admission medications   Medication Sig Start Date End Date Taking? Authorizing Provider  albuterol (PROVENTIL HFA;VENTOLIN HFA) 108 (90 Base) MCG/ACT inhaler Inhale 2 puffs into the lungs every 4 (four) hours as needed for wheezing or shortness of breath. 12/26/15   Roberto Scales, MD  atorvastatin (LIPITOR) 10 MG tablet Take 10 mg by mouth daily. 08/17/16   [provider]  carvedilol (COREG) 6.25 MG tablet Take 1 tablet (6.25 mg total) by mouth 2 (two) times daily with a meal. Patient taking differently: Take 3.125 mg by mouth 2 (two) times daily with a meal.  12/29/15   Mariel Aloe, MD  cephALEXin (KEFLEX) 500 MG capsule Take 1 capsule (500 mg total) by mouth 4 (four) times daily. Patient not taking: Reported on 11/02/2017 10/09/16   Recardo Evangelist, PA-C  enalapril (VASOTEC) 20 MG tablet Take 40 mg by mouth at bedtime.     [provider]  gabapentin (NEURONTIN) 300 MG capsule Take 300 mg by mouth 3 (three) times daily.    [provider]  valACYclovir (VALTREX) 500 MG tablet Take 500 mg by mouth daily at 12 noon. 10/20/17   [provider]  Vitamin D, Ergocalciferol, (DRISDOL) 50000 units CAPS capsule Take 50,000 Units by mouth  once a week. 09/14/16   [provider]    Physical Exam: Vitals:   02/02/18 0045 02/02/18 0220 02/02/18 0346 02/02/18 0415  BP: 100/61   (!) 147/84  Pulse: 87   85  Resp: 16   17  Temp:  97.8 F (36.6 C)    TempSrc:  Oral    SpO2: 97%  95% 98%  Weight:          Constitutional: Moderately built and nourished. Vitals:   02/02/18 0045 02/02/18 0220 02/02/18 0346 02/02/18 0415  BP: 100/61   (!) 147/84  Pulse: 87   85  Resp: 16   17  Temp:  97.8 F (36.6 C)    TempSrc:  Oral    SpO2: 97%  95% 98%  Weight:        Eyes: Anicteric no pallor. ENMT: No discharge from the ears eyes nose or mouth. Neck: No mass or.  JVD elevated. Respiratory: No rhonchi or crepitations. Cardiovascular: S1-S2 heard. Abdomen: Soft nontender bowel sounds present. Musculoskeletal: Mild edema. Skin: No rash. Neurologic: Alert awake oriented to time place and person.  Moves all extremities. Psychiatric: Appears normal per normal affect.   Labs on Admission: I have personally reviewed following labs and imaging studies  CBC: Recent Labs  Lab 02/01/18 2328 02/02/18 0343  WBC 12.7*  --   NEUTROABS 6.0  --   HGB 15.0 13.9  HCT 50.9 41.0  MCV 96.0  --   PLT 319  --    Basic Metabolic Panel: Recent Labs  Lab 02/01/18 2328 02/02/18 0343  NA 141 139  K 3.4* 4.8  CL 104 106  CO2 19*  --   GLUCOSE 218* 103*  BUN 20 26*  CREATININE 1.30* 1.30*  CALCIUM 9.1  --   MG 2.0  --    GFR: Estimated Creatinine Clearance: 85 mL/min (A) (by C-G formula based on SCr of 1.3 mg/dL (H)). Liver Function Tests: No results for input(s): AST, ALT, ALKPHOS, BILITOT, PROT, ALBUMIN in the last 168 hours. No results for input(s): LIPASE, AMYLASE in the last 168 hours. No results for input(s): AMMONIA in the last 168 hours. Coagulation Profile: Recent Labs  Lab 02/01/18 2328  INR 1.09   Cardiac Enzymes: Recent Labs  Lab 02/01/18 2328 02/02/18 0357  TROPONINI 0.03* 0.34*   BNP (last 3 results) No results for input(s): PROBNP in the last 8760 hours. HbA1C: No results for input(s): HGBA1C in the last 72 hours. CBG: No results for input(s): GLUCAP in the last 168 hours. Lipid Profile: No results for input(s): CHOL, HDL, LDLCALC, TRIG, CHOLHDL, LDLDIRECT in the last 72 hours. Thyroid Function Tests: No results for input(s): TSH, T4TOTAL, FREET4, T3FREE, THYROIDAB in the last 72 hours. Anemia Panel: No results for input(s): VITAMINB12, FOLATE, FERRITIN, TIBC, IRON, RETICCTPCT in the last 72 hours. Urine analysis: No  results found for: COLORURINE, APPEARANCEUR, LABSPEC, PHURINE, GLUCOSEU, HGBUR, BILIRUBINUR, KETONESUR, PROTEINUR, UROBILINOGEN, NITRITE, LEUKOCYTESUR Sepsis Labs: @LABRCNTIP (procalcitonin:4,lacticidven:4) )No results found for this or any previous visit (from the past 240 hour(s)).   Radiological Exams on Admission: Ct Angio Chest Pe W And/or Wo Contrast  Result Date: 02/02/2018 CLINICAL DATA:  57 year old male with respiratory distress. Concern for pulmonary embolism. EXAM: CT ANGIOGRAPHY CHEST WITH CONTRAST TECHNIQUE: Multidetector CT imaging of the chest was performed using the standard protocol during bolus administration of intravenous contrast. Multiplanar CT image reconstructions and MIPs were obtained to evaluate the vascular anatomy. CONTRAST:  164mL ISOVUE-370 IOPAMIDOL (ISOVUE-370) INJECTION 76% COMPARISON:  Chest radiograph dated 02/01/2018 FINDINGS: Cardiovascular: There is borderline cardiomegaly. There is retrograde flow of contrast from the right atrium into the IVC suggestive of a degree of right heart dysfunction. No pericardial effusion. The thoracic aorta is unremarkable. No CT evidence of pulmonary embolism. Mediastinum/Nodes: No hilar or mediastinal adenopathy. The esophagus is grossly unremarkable. No mediastinal fluid collection. Lungs/Pleura: There is diffuse ground-glass airspace density throughout the lungs which may represent pneumonia, alveolar edema, or ARDS. Clinical correlation is recommended. There is no pleural effusion or pneumothorax. The central airways are patent. Upper Abdomen: No acute abnormality. Musculoskeletal: No chest wall abnormality. No acute or significant osseous findings. Review of the MIP images confirms the above findings. IMPRESSION: 1. No CT evidence of pulmonary embolism. 2. Diffuse ground-glass airspace density throughout the lungs may represent pneumonia, alveolar edema, or ARDS. Clinical correlation is recommended. Electronically Signed   By: Anner Crete M.D.   On: 02/02/2018 03:18   Dg Chest Portable 1 View  Result Date: 02/01/2018 CLINICAL DATA:  Shortness of breath EXAM: PORTABLE CHEST 1 VIEW COMPARISON:  11/02/2017 CT and radiograph FINDINGS: Cardiomegaly with vascular congestion. Diffuse bilateral hazy and ground-glass opacity, may reflect pulmonary edema. Patchy more confluent airspace disease at the right infrahilar lung and left base. No pneumothorax. IMPRESSION: 1. Cardiomegaly with vascular congestion and diffuse bilateral hazy and ground-glass density suspect for pulmonary edema. 2. More confluent areas of airspace disease within the left base and right infrahilar lung may reflect superimposed pneumonia Electronically Signed   By: Donavan Foil M.D.   On: 02/01/2018 23:23    EKG: Independently reviewed.  Normal sinus rhythm IVCD.  Assessment/Plan Active Problems:   Acute respiratory failure with hypoxia (HCC)    1. Acute respiratory failure with hypoxia secondary to acute CHF in the setting of elevated blood pressure -we will cycle cardiac markers continue Lasix closely follow intake output metabolic panel daily weights check 2D echo.  Patient is on ACE inhibitor. 2. Hypertensive urgency -improved with Lasix.  Continue ACE inhibitor and Coreg.  Note that patient has mildly elevated creatinine and is on medications which can further elevate. 3. Chest pain with elevated troponin -appreciate cardiology consult we will cycle cardiac markers check 2D echo.  Likely from demand ischemia from CHF.  Patient has been already started on heparin.  Patient is on statins aspirin and Coreg. 4. Mild elevated creatinine likely from acute renal failure -follow metabolic panel closely.  Note that patient is on Lasix and ARB. 5. Tobacco abuse with history of bronchitis on inhalers.  Tobacco cessation counseling requested. 6. Hyperglycemia check hemoglobin A1c. 7. Hyperlipidemia on statins.   DVT prophylaxis: Heparin infusion. Code Status:  Full code. Family Communication: Discussed with patient. Disposition Plan: Home. Consults called: Cardiology. Admission status: Inpatient.   Rise Patience MD [facility] Pager [phone].  If 7PM-7AM, please contact night-coverage [email or web address] Password [facility]  02/02/2018, 5:12 AM

## 2018-02-02 NOTE — ED Notes (Signed)
Breathing improved; c/o chronic hip pain, moved pt to hospital bed for comfort; sitting on side of bed at present

## 2018-02-02 NOTE — Progress Notes (Signed)
ANTICOAGULATION CONSULT NOTE - Initial Consult  Pharmacy Consult for Heparin Indication: chest pain/ACS  No Known Allergies  Patient Measurements: Weight: 300 lb (136.1 kg) Heparin Dosing Weight: 105 kg  Vital Signs: Temp: 97.8 F (36.6 C) (12/12 0220) Temp Source: Oral (12/12 0220) BP: 100/61 (12/12 0045) Pulse Rate: 87 (12/12 0045)  Labs: Recent Labs    02/01/18 2328 02/02/18 0343  HGB 15.0 13.9  HCT 50.9 41.0  PLT 319  --   LABPROT 14.0  --   INR 1.09  --   CREATININE 1.30* 1.30*  TROPONINI 0.03*  --     Estimated Creatinine Clearance: 85 mL/min (A) (by C-G formula based on SCr of 1.3 mg/dL (H)).   Medical History: Past Medical History:  Diagnosis Date  . Hypertension   . Neuropathy   . Pulmonary embolism (HCC)     Medications:  No current facility-administered medications on file prior to encounter.    Current Outpatient Medications on File Prior to Encounter  Medication Sig Dispense Refill  . albuterol (PROVENTIL HFA;VENTOLIN HFA) 108 (90 Base) MCG/ACT inhaler Inhale 2 puffs into the lungs every 4 (four) hours as needed for wheezing or shortness of breath. 18 g 0  . atorvastatin (LIPITOR) 10 MG tablet Take 10 mg by mouth daily.  1  . carvedilol (COREG) 6.25 MG tablet Take 1 tablet (6.25 mg total) by mouth 2 (two) times daily with a meal. (Patient taking differently: Take 3.125 mg by mouth 2 (two) times daily with a meal. ) 60 tablet 0  . cephALEXin (KEFLEX) 500 MG capsule Take 1 capsule (500 mg total) by mouth 4 (four) times daily. (Patient not taking: Reported on 11/02/2017) 28 capsule 0  . enalapril (VASOTEC) 20 MG tablet Take 40 mg by mouth at bedtime.     . gabapentin (NEURONTIN) 300 MG capsule Take 300 mg by mouth 3 (three) times daily.    . valACYclovir (VALTREX) 500 MG tablet Take 500 mg by mouth daily at 12 noon.  3  . Vitamin D, Ergocalciferol, (DRISDOL) 50000 units CAPS capsule Take 50,000 Units by mouth once a week.  3     Assessment: 57 y.o.  male with SOB/elevated cardiac markers for heparin Goal of Therapy:  Heparin level 0.3-0.7 units/ml Monitor platelets by anticoagulation protocol: Yes   Plan:  Heparin 4000 units IV bolus, then start heparin 1650 units/hr Check heparin level in 6 hours.   Benedicto Capozzi, [place] Curb 02/02/2018,3:57 AM

## 2018-02-02 NOTE — ED Notes (Signed)
Placed order for lunch tray.

## 2018-02-02 NOTE — Progress Notes (Signed)
Progress Note  Patient Name: Antuan Limes Date of Encounter: 02/02/2018  Primary Cardiologist: No primary care provider on file.   Subjective   Denies any chest pain.  SOB has improved with lasix.    Inpatient Medications    Scheduled Meds: . aspirin EC  81 mg Oral Daily  . atorvastatin  10 mg Oral Daily  . carvedilol  3.125 mg Oral BID WC  . enalapril  40 mg Oral QHS  . furosemide  40 mg Intravenous BID  . gabapentin  300 mg Oral TID  . nitroGLYCERIN  1 inch Topical Q6H   Continuous Infusions: . heparin 1,650 Units/hr (02/02/18 0603)   PRN Meds: acetaminophen **OR** acetaminophen, ondansetron **OR** ondansetron (ZOFRAN) IV   Vital Signs    Vitals:   02/02/18 0415 02/02/18 0515 02/02/18 0830 02/02/18 1045  BP: (!) 147/84 (!) 157/75 (!) 130/98 (!) 129/97  Pulse: 85 80 87 67  Resp: 17 14 (!) 23 (!) 22  Temp:      TempSrc:      SpO2: 98% 94% 100% 97%  Weight:        Intake/Output Summary (Last 24 hours) at 02/02/2018 1227 Last data filed at 02/02/2018 0941 Gross per 24 hour  Intake -  Output 1500 ml  Net -1500 ml   Filed Weights   02/01/18 2332  Weight: 136.1 kg    Telemetry    NSR - Personally Reviewed  ECG    NSR with IVCD and LAD - Personally Reviewed  Physical Exam   GEN: No acute distress.   Neck: No JVD Cardiac: RRR, no murmurs, rubs, or gallops.  Respiratory: Clear to auscultation bilaterally. GI: Soft, nontender, non-distended  MS: No edema; No deformity. Neuro:  Nonfocal  Psych: Normal affect   Labs    Chemistry Recent Labs  Lab 02/01/18 2328 02/02/18 0343 02/02/18 0524  NA 141 139 143  K 3.4* 4.8 4.5  CL 104 106 105  CO2 19*  --  26  GLUCOSE 218* 103* 146*  BUN 20 26* 23*  CREATININE 1.30* 1.30* 1.47*  CALCIUM 9.1  --  8.9  PROT  --   --  6.7  ALBUMIN  --   --  3.8  AST  --   --  39  ALT  --   --  58*  ALKPHOS  --   --  67  BILITOT  --   --  0.9  GFRNONAA 60*  --  51*  GFRAA >60  --  60*  ANIONGAP 18*  --  12       Hematology Recent Labs  Lab 02/01/18 2328 02/02/18 0343 02/02/18 0524  WBC 12.7*  --  11.8*  RBC 5.30  --  4.68  HGB 15.0 13.9 13.5  HCT 50.9 41.0 43.3  MCV 96.0  --  92.5  MCH 28.3  --  28.8  MCHC 29.5*  --  31.2  RDW 15.3  --  15.3  PLT 319  --  258    Cardiac Enzymes Recent Labs  Lab 02/01/18 2328 02/02/18 0357 02/02/18 0524  TROPONINI 0.03* 0.34* 0.46*    Recent Labs  Lab 02/02/18 0005 02/02/18 0341  TROPIPOC 0.04 0.39*     BNP Recent Labs  Lab 02/01/18 2328  BNP 104.4*     DDimer  Recent Labs  Lab 02/01/18 2328  DDIMER 1.81*     Radiology    Ct Angio Chest Pe W And/or Wo Contrast  Result Date: 02/02/2018 CLINICAL  DATA:  57 year old male with respiratory distress. Concern for pulmonary embolism. respiratory distress. Concern for pulmonary embolism. EXAM: CT ANGIOGRAPHY CHEST WITH CONTRAST TECHNIQUE: Multidetector CT imaging of the chest was performed using the standard protocol during bolus administration of intravenous contrast. Multiplanar CT image reconstructions and MIPs were obtained to evaluate the vascular anatomy. CONTRAST:  134mL ISOVUE-370 IOPAMIDOL (ISOVUE-370) INJECTION 76% COMPARISON:  Chest radiograph dated 02/01/2018 FINDINGS: Cardiovascular: There is borderline cardiomegaly. There is retrograde flow of contrast from the right atrium into the IVC suggestive of a degree of right heart dysfunction. No pericardial effusion. The thoracic aorta is unremarkable. No CT evidence of pulmonary embolism. Mediastinum/Nodes: No hilar or mediastinal adenopathy. The esophagus is grossly unremarkable. No mediastinal fluid collection. Lungs/Pleura: There is diffuse ground-glass airspace density throughout the lungs which may represent pneumonia, alveolar edema, or ARDS. Clinical correlation is recommended. There is no pleural effusion or pneumothorax. The central airways are patent. Upper Abdomen: No acute abnormality. Musculoskeletal: No chest wall abnormality. No acute or significant osseous findings. Review  of the MIP images confirms the above findings. IMPRESSION: 1. No CT evidence of pulmonary embolism. 2. Diffuse ground-glass airspace density throughout the lungs may represent pneumonia, alveolar edema, or ARDS. Clinical correlation is recommended. Electronically Signed   By: Anner Crete M.D.   On: 02/02/2018 03:18   Dg Chest Portable 1 View  Result Date: 02/01/2018 CLINICAL DATA:  Shortness of breath EXAM: PORTABLE CHEST 1 VIEW COMPARISON:  11/02/2017 CT and radiograph FINDINGS: Cardiomegaly with vascular congestion. Diffuse bilateral hazy and ground-glass opacity, may reflect pulmonary edema. Patchy more confluent airspace disease at the right infrahilar lung and left base. No pneumothorax. IMPRESSION: 1. Cardiomegaly with vascular congestion and diffuse bilateral hazy and ground-glass density suspect for pulmonary edema. 2. More confluent areas of airspace disease within the left base and right infrahilar lung may reflect superimposed pneumonia Electronically Signed   By: Donavan Foil M.D.   On: 02/01/2018 23:23    Cardiac Studies   none  Patient Profile     57 y.o. male with PMH significant for HTN, HLD, DM and asthma who presents to the ER tonight with shortness of breath. He states that he was sitting at home when he started to feel short of breath. He tried to use his albuterol inhaler but started to become more and more short of breath.  He began to panic as he felt like he could not take a deep breath, prompting his family to call 911. Per the reports, he was found to be sating in the 56s when EMS arrived. He felt improved with oxygen therapy. In the ER, he was started on bipap with improvement in his work of breathing.   Assessment & Plan    1.  Acute CHF -He has a history of asthma but his albuterol inhaler did not improve sx -O2 sats 70% on EMS arrival which improved with O2 -no hx of CHF in the past - echo pending -Got Lasix IV in ER and has put out 800cc and is net neg 1.5L  thus far -O2 sat 97% on 3LNC -creatinine 1.3 on admit and now 1.47 after Lasix and exposure to IV contrast -continue BB and ARB for now  2.  HTN  -BP is controlled on exam today.  -Continue on carvedilol 3.125mg  BID, Enalapril 40mg  daily  3.  Chest pain/elevated trop -he has had intermittent CP for over a year with normal stress test in 2016 -CP atypical and reproducible with palpation of Chest wall -chest CTA with no PE -  he has CRFs including HTN, hyperlipidemia and DM. -will await results of echo.  If LVF is normal then nuclear stress test vs. Coronary CTA.  If LVF reduced then heart cath.  -continue ASA, BB, IV Heparin and statin  4.  AKI -creatinine mildly elevated on admission and increased after IV contrast for Chest CTA -continue to follow creatinine       For questions or updates, please contact [place] Please consult [email or web address] for contact info under Cardiology/STEMI.      Signed, Fransico Him, MD  02/02/2018, 12:27 PM

## 2018-02-02 NOTE — Progress Notes (Signed)
ANTICOAGULATION CONSULT NOTE   Pharmacy Consult for Heparin Indication: chest pain/ACS  No Known Allergies  Patient Measurements: Weight: 300 lb (136.1 kg) Heparin Dosing Weight: 105 kg  Vital Signs: Temp: 97.8 F (36.6 C) (12/12 0220) Temp Source: Oral (12/12 0220) BP: 129/97 (12/12 1045) Pulse Rate: 67 (12/12 1045)  Labs: Recent Labs    02/01/18 2328 02/02/18 0343 02/02/18 0357 02/02/18 0524 02/02/18 1205  HGB 15.0 13.9  --  13.5  --   HCT 50.9 41.0  --  43.3  --   PLT 319  --   --  258  --   LABPROT 14.0  --   --   --   --   INR 1.09  --   --   --   --   HEPARINUNFRC  --   --   --   --  0.43  CREATININE 1.30* 1.30*  --  1.47*  --   TROPONINI 0.03*  --  0.34* 0.46* 0.60*    Estimated Creatinine Clearance: 75.2 mL/min (A) (by C-G formula based on SCr of 1.47 mg/dL (H)).     Assessment: 57 y.o. male with on heparin for r/o ACS. Trop 0.6. Pharmacy consulted for heparin gtt.  Heparin level therapeutic (0.43) on gtt at 1650 units/hr. No bleeding noted.  Goal of Therapy:  Heparin level 0.3-0.7 units/ml Monitor platelets by anticoagulation protocol: Yes   Plan:  Continue heparin 1650 units/hr F/u daily heparin level and CBC  Sherlon Handing, PharmD, BCPS Clinical pharmacist  **Pharmacist phone directory can now be found on [email or web address] ([email or web address]).  Listed under [place]. 02/02/2018,1:09 PM

## 2018-02-02 NOTE — Plan of Care (Signed)
   PLAN OF CARE  Russell Collier WUJ:[id number] DOB: 1960/05/01 DOA: 02/01/2018 PCP: [facility]  HPI/Brief Narrative  Russell Collier is a 57 y.o. year old male with medical history significant for HTN, HLD, DM (untreated), BPH, GERD who presented on 02/01/2018 with several months of progressive orthopnea, paroxysmal nocturnal dyspnea, lower extremity swelling with significant worsening in the past few days and was found to have vascular congestion consistent with pulmonary edema concerning for CHF exacerbation (new diagnosis).  Initially on arrival was tachypneic to high 20s to 40s, blood pressure 183/126.  His initial troponin was 0.03 and trended upwards to 0.46 prompting cardiology consultation for ischemic evaluation.  Patient was started on heparin infusion and given IV Lasix for presumed CHF exacerbation.  Required BiPAP while in ED but was able to transition to 2 L oxygen and remained stable for several hours in the emergency department on that supplementation  Subjective Patient feels much better currently Still having cough shortness of breath but better at rest not requiring any oxygen  Assessment/Plan:  #Acute hypoxic respiratory failure secondary to CHF exacerbation.-Congestion on chest x-ray and CT chest with clinical presentation of peripheral edema, PND, orthopnea consistent with likely CHF.Presumed diastolic given elevated blood pressure we will follow-up TTE, continue IV Lasix regimen.  Follow strict I's and O's, daily weights.  #Elevated troponin. Likely demand ischemia related to above given atypical chest pain. Additionally cocaine positive on UDS which could contribute. TTE to look for wall motion abnormalities and follow troponin until peaks, currently on heparin infusion.  #New CHF exacerbation?. No known prior hx (will have to get records to ensure from [facility]). BNP only 100 but can be low in setting of obesity. Pulmonary edema on CXR with peripheral edema  on exam seems consistent F/uTTE to determine EF, IV lasix to improve volume status  #Positive Opiates/Cocaine.  UDS positive for cocaine and opioids.  Patient denies any cocaine consumption.  #HTN, improving.  Elevated to 956O systolically upon admission, now improved after diuresis and initiation of home BP meds/reports adherence at home.  Likely contributing factor to CHF exacerbation.  Continue home carvedilol, enalapril (increased to 40 mg), monitor for need for up titration, IV hydralazine PRN 5#hyperlipidemia, stable.  Continue home atorvastatin  #Tobacco abuse, current.  Has been attempting tobacco cessation previously on Chantix.  Will start nicotine patch  #AKI, likely prerenal etiology related to decreased effective arterial volume in setting of CHF exacerbation.  Will closely monitor given patient is currently on ACE inhibitor.  Expect improvement with diuresis.  Previous baseline seems consistent with creatinine of 1.  Continue monitor on BMP.   Exam:  Constitutional: Obese male in no distress Eyes: EOMI, anicteric, normal conjunctivae ENMT: Oropharynx with moist mucous membranes, normal dentition Cardiovascular: RRR no MRGs, with 1+ pitting edema bilateral lower extremities Respiratory: Normal respiratory effort currently on room air, no crackles or wheezing heard on exam,  Abdomen: Soft,non-tender, Skin: No rash ulcers, or lesions. Without skin tenting  Neurologic: Grossly no focal neuro deficit. Psychiatric:Appropriate affect, and mood. Mental status AAOx3   Remainder per H&P from today   Desiree Hane, MD [facility] Pager [phone]  If 7PM-7AM, please contact night-coverage [email or web address] Password [facility] 02/02/2018, 2:08 PM

## 2018-02-02 NOTE — ED Notes (Signed)
Pt uncomfortable on bipap, reports not feeling like he can get enough air. Multiple adjustments attempted without improvement. RT notified

## 2018-02-03 ENCOUNTER — Encounter (HOSPITAL_COMMUNITY)
Admission: EM | Disposition: A | Discharge status: Home / Self Care | Payer: Self-pay | Source: Home / Self Care | Attending: Internal Medicine

## 2018-02-03 ENCOUNTER — Inpatient Hospital Stay (HOSPITAL_COMMUNITY): Payer: Medicaid Other

## 2018-02-03 DIAGNOSIS — R825 Elevated urine levels of drugs, medicaments and biological substances: Secondary | ICD-10-CM | POA: Diagnosis present

## 2018-02-03 DIAGNOSIS — Z720 Tobacco use: Secondary | ICD-10-CM | POA: Diagnosis present

## 2018-02-03 DIAGNOSIS — E785 Hyperlipidemia, unspecified: Secondary | ICD-10-CM

## 2018-02-03 DIAGNOSIS — R778 Other specified abnormalities of plasma proteins: Secondary | ICD-10-CM | POA: Diagnosis present

## 2018-02-03 DIAGNOSIS — J9601 Acute respiratory failure with hypoxia: Secondary | ICD-10-CM

## 2018-02-03 DIAGNOSIS — R7989 Other specified abnormal findings of blood chemistry: Secondary | ICD-10-CM | POA: Diagnosis present

## 2018-02-03 HISTORY — DX: Other specified abnormalities of plasma proteins: R77.8

## 2018-02-03 HISTORY — DX: Hyperlipidemia, unspecified: E78.5

## 2018-02-03 HISTORY — DX: Other specified abnormal findings of blood chemistry: R79.89

## 2018-02-03 HISTORY — DX: Elevated urine levels of drugs, medicaments and biological substances: R82.5

## 2018-02-03 LAB — CBC
HCT: 41 % (ref 39.0–52.0)
Hemoglobin: 13.1 g/dL (ref 13.0–17.0)
MCH: 29.1 pg (ref 26.0–34.0)
MCHC: 32 g/dL (ref 30.0–36.0)
MCV: 91.1 fL (ref 80.0–100.0)
Platelets: 235 10*3/uL (ref 150–400)
RBC: 4.5 MIL/uL (ref 4.22–5.81)
RDW: 15.3 % (ref 11.5–15.5)
WBC: 9.8 10*3/uL (ref 4.0–10.5)
nRBC: 0 % (ref 0.0–0.2)

## 2018-02-03 LAB — BASIC METABOLIC PANEL
Anion gap: 11 (ref 5–15)
BUN: 25 mg/dL — ABNORMAL HIGH (ref 6–20)
CO2: 26 mmol/L (ref 22–32)
Calcium: 8.9 mg/dL (ref 8.9–10.3)
Chloride: 103 mmol/L (ref 98–111)
Creatinine, Ser: 1.18 mg/dL (ref 0.61–1.24)
GFR calc Af Amer: >60 mL/min (ref >60)
GFR calc non Af Amer: >60 mL/min (ref >60)
Glucose, Bld: 108 mg/dL — ABNORMAL HIGH (ref 70–99)
Potassium: 4.1 mmol/L (ref 3.5–5.1)
Sodium: 140 mmol/L (ref 135–145)

## 2018-02-03 LAB — ECHOCARDIOGRAM COMPLETE
Height: 70 in
Weight: 4772.8 oz

## 2018-02-03 LAB — HEPARIN LEVEL (UNFRACTIONATED): HEPARIN UNFRACTIONATED: 0.41 [IU]/mL (ref 0.30–0.70)

## 2018-02-03 SURGERY — LEFT HEART CATH AND CORONARY ANGIOGRAPHY
Anesthesia: LOCAL

## 2018-02-03 MED ORDER — SODIUM CHLORIDE 0.9 % IV SOLN: 250.0000 mL | INTRAVENOUS | Status: DC | PRN

## 2018-02-03 MED ORDER — SODIUM CHLORIDE 0.9 % IV SOLN: INTRAVENOUS | Status: DC

## 2018-02-03 MED ORDER — SODIUM CHLORIDE 0.9% FLUSH: 3.0000 mL | INTRAVENOUS | Status: DC | PRN

## 2018-02-03 MED ORDER — NITROGLYCERIN 0.4 MG SL SUBL
SUBLINGUAL_TABLET | SUBLINGUAL | Status: AC
  Filled 2018-02-03: qty 1

## 2018-02-03 MED ORDER — SODIUM CHLORIDE 0.9% FLUSH
3.0000 mL | Freq: Two times a day (BID) | INTRAVENOUS | Status: DC
  Administered 2018-02-03 – 2018-02-05 (×4): 3 mL via INTRAVENOUS

## 2018-02-03 MED ORDER — MORPHINE SULFATE (PF) 2 MG/ML IV SOLN: 2.0000 mg | Freq: Once | INTRAVENOUS | Status: DC

## 2018-02-03 MED ORDER — FUROSEMIDE 10 MG/ML IJ SOLN
40.0000 mg | Freq: Once | INTRAMUSCULAR | Status: AC
  Administered 2018-02-03: 40 mg via INTRAVENOUS

## 2018-02-03 MED ORDER — NITROGLYCERIN 0.4 MG SL SUBL: 0.4000 mg | SUBLINGUAL_TABLET | SUBLINGUAL | Status: DC | PRN

## 2018-02-03 MED ORDER — FUROSEMIDE 10 MG/ML IJ SOLN
INTRAMUSCULAR | Status: AC
  Filled 2018-02-03: qty 4

## 2018-02-03 MED ORDER — ASPIRIN 81 MG PO CHEW: 81.0000 mg | CHEWABLE_TABLET | ORAL | Status: DC

## 2018-02-03 MED ORDER — NITROGLYCERIN 0.4 MG SL SUBL
SUBLINGUAL_TABLET | SUBLINGUAL | Status: AC
  Administered 2018-02-03: 16:00:00
  Filled 2018-02-03: qty 1

## 2018-02-03 MED ORDER — NITROGLYCERIN IN D5W 200-5 MCG/ML-% IV SOLN
0.0000 ug/min | INTRAVENOUS | Status: DC
  Administered 2018-02-03: 5 ug/min via INTRAVENOUS
  Filled 2018-02-03: qty 250

## 2018-02-03 NOTE — Progress Notes (Signed)
  Echocardiogram 2D Echocardiogram has been performed.  Johny [name] 02/03/2018, 10:50 AM

## 2018-02-03 NOTE — Progress Notes (Addendum)
Patient called nurse stating that he is having CP and SOB.  Has not had  stress test yet as he was waiting for his 2D echo to be done to assess LVF which showed normal LVF with EF 55-60% but could not rule out wall motion abnormalities.  Ill appearing on exam complaining of chest tightness with numbness and tingling of his left hand and worsening SOB.  Exam shows markedly elevated BP at 170/159mmhg.  Lungs have crackles at bases bilaterally.  SL NTG given.  Start IV NTG gtt and titrate for CP and HTN.  I will give Lasix 40mg  IV now. Given his mildly elevated trop and recurrent CP will cancel stress test and get cath this afternoon.  Cardiac catheterization was discussed with the patient fully. The patient understands that risks include but are not limited to stroke (1 in 1000), death (1 in 20), kidney failure [usually temporary] (1 in 500), bleeding (1 in 200), allergic reaction [possibly serious] (1 in 200).  The patient understands and is willing to proceed.

## 2018-02-03 NOTE — Progress Notes (Signed)
Patient complained of 7/10 left chest pain. EKG and vitals completed. Oxygen and SL nitro given x1; chest pain has resolved. Cards at bedside. Nitro gtt started per MD due to CP and hypertension. Awaiting progressive bed.

## 2018-02-03 NOTE — Progress Notes (Signed)
Progress Note  Patient Name: Russell Collier Date of Encounter: 02/03/2018  Primary Cardiologist: No primary care provider on file.   Subjective   Denies any chest pain but still has SOB.  Had echo done this am but results still pending  Inpatient Medications    Scheduled Meds: . aspirin EC  81 mg Oral Daily  . atorvastatin  10 mg Oral Daily  . carvedilol  3.125 mg Oral BID WC  . enalapril  40 mg Oral QHS  . furosemide  40 mg Intravenous BID  . gabapentin  300 mg Oral TID  . nicotine  14 mg Transdermal Daily  . nitroGLYCERIN  1 inch Topical Q6H   Continuous Infusions: . heparin 1,650 Units/hr (02/03/18 1050)   PRN Meds: acetaminophen **OR** acetaminophen, ondansetron **OR** ondansetron (ZOFRAN) IV   Vital Signs    Vitals:   02/02/18 2358 02/03/18 0403 02/03/18 0410 02/03/18 0808  BP: (!) 149/74 (!) 142/79  136/78  Pulse: 69 79  (!) 55  Resp: 20 20  19   Temp: 98.2 F (36.8 C) 98.4 F (36.9 C)  97.8 F (36.6 C)  TempSrc: Oral Oral  Oral  SpO2: 96% 98%  98%  Weight:  (!) 136.1 kg 135.3 kg   Height:        Intake/Output Summary (Last 24 hours) at 02/03/2018 1149 Last data filed at 02/03/2018 1146 Gross per 24 hour  Intake 1359.33 ml  Output 1700 ml  Net -340.67 ml   Filed Weights   02/02/18 1817 02/03/18 0403 02/03/18 0410  Weight: (!) 136.9 kg (!) 136.1 kg 135.3 kg    Telemetry    NSR - Personally Reviewed  ECG    No new EKG to review - Personally Reviewed  Physical Exam   GEN: Well nourished, well developed in no acute distress HEENT: Normal NECK: No JVD; No carotid bruits LYMPHATICS: No lymphadenopathy CARDIAC:RRR, no murmurs, rubs, gallops RESPIRATORY:  Clear to auscultation without rales, wheezing or rhonchi  ABDOMEN: Soft, non-tender, non-distended MUSCULOSKELETAL:  No edema; No deformity  SKIN: Warm and dry NEUROLOGIC:  Alert and oriented x 3 PSYCHIATRIC:  Normal affect    Labs    Chemistry Recent Labs  Lab 02/01/18 2328  02/02/18 0343 02/02/18 0524 02/03/18 0442  NA 141 139 143 140  K 3.4* 4.8 4.5 4.1  CL 104 106 105 103  CO2 19*  --  26 26  GLUCOSE 218* 103* 146* 108*  BUN 20 26* 23* 25*  CREATININE 1.30* 1.30* 1.47* 1.18  CALCIUM 9.1  --  8.9 8.9  PROT  --   --  6.7  --   ALBUMIN  --   --  3.8  --   AST  --   --  39  --   ALT  --   --  58*  --   ALKPHOS  --   --  67  --   BILITOT  --   --  0.9  --   GFRNONAA 60*  --  51* >60  GFRAA >60  --  60* >60  ANIONGAP 18*  --  12 11     Hematology Recent Labs  Lab 02/01/18 2328 02/02/18 0343 02/02/18 0524 02/03/18 0442  WBC 12.7*  --  11.8* 9.8  RBC 5.30  --  4.68 4.50  HGB 15.0 13.9 13.5 13.1  HCT 50.9 41.0 43.3 41.0  MCV 96.0  --  92.5 91.1  MCH 28.3  --  28.8 29.1  MCHC 29.5*  --  31.2 32.0  RDW 15.3  --  15.3 15.3  PLT 319  --  258 235    Cardiac Enzymes Recent Labs  Lab 02/02/18 0357 02/02/18 0524 02/02/18 1205 02/02/18 1710  TROPONINI 0.34* 0.46* 0.60* 0.38*    Recent Labs  Lab 02/02/18 0005 02/02/18 0341  TROPIPOC 0.04 0.39*     BNP Recent Labs  Lab 02/01/18 2328  BNP 104.4*     DDimer  Recent Labs  Lab 02/01/18 2328  DDIMER 1.81*     Radiology    Ct Angio Chest Pe W And/or Wo Contrast  Result Date: 02/02/2018 CLINICAL DATA:  57 year old male with respiratory distress. Concern for pulmonary embolism. EXAM: CT ANGIOGRAPHY CHEST WITH CONTRAST TECHNIQUE: Multidetector CT imaging of the chest was performed using the standard protocol during bolus administration of intravenous contrast. Multiplanar CT image reconstructions and MIPs were obtained to evaluate the vascular anatomy. CONTRAST:  111mL ISOVUE-370 IOPAMIDOL (ISOVUE-370) INJECTION 76% COMPARISON:  Chest radiograph dated 02/01/2018 FINDINGS: Cardiovascular: There is borderline cardiomegaly. There is retrograde flow of contrast from the right atrium into the IVC suggestive of a degree of right heart dysfunction. No pericardial effusion. The thoracic aorta is  unremarkable. No CT evidence of pulmonary embolism. Mediastinum/Nodes: No hilar or mediastinal adenopathy. The esophagus is grossly unremarkable. No mediastinal fluid collection. Lungs/Pleura: There is diffuse ground-glass airspace density throughout the lungs which may represent pneumonia, alveolar edema, or ARDS. Clinical correlation is recommended. There is no pleural effusion or pneumothorax. The central airways are patent. Upper Abdomen: No acute abnormality. Musculoskeletal: No chest wall abnormality. No acute or significant osseous findings. Review of the MIP images confirms the above findings. IMPRESSION: 1. No CT evidence of pulmonary embolism. 2. Diffuse ground-glass airspace density throughout the lungs may represent pneumonia, alveolar edema, or ARDS. Clinical correlation is recommended. Electronically Signed   By: Anner Crete M.D.   On: 02/02/2018 03:18   Dg Chest Portable 1 View  Result Date: 02/01/2018 CLINICAL DATA:  Shortness of breath EXAM: PORTABLE CHEST 1 VIEW COMPARISON:  11/02/2017 CT and radiograph FINDINGS: Cardiomegaly with vascular congestion. Diffuse bilateral hazy and ground-glass opacity, may reflect pulmonary edema. Patchy more confluent airspace disease at the right infrahilar lung and left base. No pneumothorax. IMPRESSION: 1. Cardiomegaly with vascular congestion and diffuse bilateral hazy and ground-glass density suspect for pulmonary edema. 2. More confluent areas of airspace disease within the left base and right infrahilar lung may reflect superimposed pneumonia Electronically Signed   By: Donavan Foil M.D.   On: 02/01/2018 23:23    Cardiac Studies   none  Patient Profile     57 y.o. male with PMH significant for HTN, HLD, DM and asthma who presents to the ER tonight with shortness of breath. He states that he was sitting at home when he started to feel short of breath. He tried to use his albuterol inhaler but started to become more and more short of breath.   He began to panic as he felt like he could not take a deep breath, prompting his family to call 911. Per the reports, he was found to be sating in the 83s when EMS arrived. He felt improved with oxygen therapy. In the ER, he was started on bipap with improvement in his work of breathing.   Assessment & Plan    1.  Acute CHF -He has a history of asthma but his albuterol inhaler did not improve sx -O2 sats 70% on EMS arrival which improved with O2 -no  hx of CHF in the past - echo pending -He put out 1.6L yesterday and is net neg 1.8L since admit -O2 sat 98% on 3LNC -creatinine 1.3 on admit and bumped to 1.47 after Lasix and exposure to IV contrast - now 1.18 today -continue BB and ARB for now -hold any further lasix for now  2.  HTN  -BP is controlled on exam today.  -Continue on carvedilol 3.125mg  BID, Enalapril 40mg  daily  3.  Chest pain/elevated trop -he has had intermittent CP for over a year with normal stress test in 2016 -CP atypical and reproducible with palpation of Chest wall -trop mildly elevated with flat trend -chest CTA with no PE -he has CRFs including HTN, hyperlipidemia and DM. -2D echo pending -If LVF is normal then nuclear stress test vs. Coronary CTA.  If LVF reduced then heart cath.  -continue ASA, BB, IV Heparin and statin -he ate breakfast so I will make him NPO for now   4.  AKI -creatinine mildly elevated on admission and increased after IV contrast for Chest CTA -improved back to 1.18 today -continue to follow creatinine       For questions or updates, please contact [place] Please consult [email or web address] for contact info under Cardiology/STEMI.      Signed, Fransico Him, MD  02/03/2018, 11:49 AM

## 2018-02-03 NOTE — Progress Notes (Signed)
PROGRESS NOTE  Knight Oelkers BTD:[id number] DOB: 14-Nov-1960 DOA: 02/01/2018 PCP: [facility]  HPI/Brief Narrative  Russell Collier is a 57 y.o. year old male with medical history HTN, HLD, DM (untreated), BPH, GERD who presented on 02/01/2018 with several months of progressive orthopnea, paroxysmal nocturnal dyspnea, lower extremity swelling with significant worsening in the past few days and was found to have vascular congestion consistent with pulmonary edema concerning for CHF exacerbation (new diagnosis).  Initially on arrival was tachypneic to high 20s to 40s, blood pressure 183/126.  His initial troponin was 0.03 and trended upwards to 0.46 prompting cardiology consultation for ischemic evaluation.  Patient was started on heparin infusion and given IV Lasix for presumed CHF exacerbation.  Required BiPAP while in ED but was able to transition to 2 L oxygen and remained stable for several hours in the emergency department on that supplementation.  Patient's breathing status improved with IV Lasix with plan to hold on further diuresis to preserve kidney function on 12/13.  His TTE on 12/13 showed preserved EF but cannot rule out wall motion normalities.  Cardiology had planned for ischemic valuation with stress test.  However patient was found to have significant chest pain with associated numbness and tingling in hand and shortness of breath at 3 PM.  He was found to have an elevated blood pressure of 170/107. He was given IV lasix and started on IV nitro glycerin drip for blood pressure control and chest pain alleviation.  Cardiology now plans for cardiac cath  Subjective Currently (5 PM) feeling much better not having any active chest pain.  Feels breathing is much better.  Assessment/Plan:  #Typical chest pain with slightly elevated troponin.  Recurrent episode of chest painduring hospital stay with elements of typical cardiac pain with associated numbness and tingling in left side.   In the setting of elevated blood pressure so could be contributing factor as well as CHF flare.  Appreciate cardiology recommendations.  Cardiology plans for cardiac cath today, will move to stepdown unit, continue IV heparin, continue IV nitro drip.  #New acute diastolic exacerbation.  No known prior history.  TTE shows preserved EF.  Received IV diuresis yesterday, held off on today due to preservation of kidney function.  But with worsening chest pain received IV Lasix prior to pending cath.  Continue daily weights, strict I's and O's.  Continue enalapril and Coreg  #UDS positive for opioids/cocaine.  Patient denies any cocaine consumption.  Extensive counseling provided on day of admission.  #Hypertension.  Still having quite elevated blood pressure particularly with most recent chest pain episode here.  Expect improvement with IV nitro and hopeful to transition to oral regimen once more stable.  Previously had increased home enalapril to 40, and continued carvedilol at 3.25 twice daily  #Hyperlipidemia, stable.  Continue home low-dose atorvastatin, may need to increase to high intensity dosing pending cath results.  #AKI, improving.  Peak creatinine of 1.47 likely in the setting of CHF exacerbation in addition to contrast being used for CTA to rule out PE on admission.  Currently at 1.18 (previous baseline 0.9-1.01).  Continue to monitor on BMP.  Avoid nephrotoxins.  Tobacco abuse, current.  Patient is attempting tobacco cessation, previously on Chantix.  Continue nicotine patch.  Code Status: Full code  Family Communication: No family at bedside, communicated with daughter by phone  Disposition Plan: Plan to transfer from floor to stepdown unit given patient will undergo cardiac cath need closer monitoring, continuing IV nitro and  IV heparin in setting of active chest pain and high blood pressure though somewhat improved, continue to monitor fluid status for need for any additional IV  diuresis.   Consultants:   Treatment Team:   [facility], Rounding, MD    Procedures:  TTE 12/13: preserved EF, unable to rule out wall motion abnormalities   Antimicrobials: Anti-infectives (From admission, onward)   None         Cultures:  none  Telemetry: yes  DVT prophylaxis:  Heparin gtt   Objective: Vitals:   02/03/18 1530 02/03/18 1535 02/03/18 1545 02/03/18 1558  BP: (!) 176/107 (!) 175/100 (!) 156/83 (!) 146/102  Pulse:      Resp:      Temp:      TempSrc:      SpO2:      Weight:      Height:        Intake/Output Summary (Last 24 hours) at 02/03/2018 1704 Last data filed at 02/03/2018 1146 Gross per 24 hour  Intake 1359.33 ml  Output 1700 ml  Net -340.67 ml   Filed Weights   02/02/18 1817 02/03/18 0403 02/03/18 0410  Weight: (!) 136.9 kg (!) 136.1 kg 135.3 kg    Exam:  Constitutional: Obese male, no distress Eyes: EOMI, anicteric, normal conjunctivae ENMT: Oropharynx with moist mucous membranes, normal dentition Cardiovascular: RRR no MRGs, scant peripheral edema lower extremities Respiratory: Normal respiratory effort on room air, no overt wheezing, crackles at bases clear breath sounds  Abdomen: Soft,non-tender, with no HSM Skin: No rash ulcers, or lesions. Without skin tenting  Neurologic: Grossly no focal neuro deficit. Psychiatric:Appropriate affect, and mood. Mental status AAOx3  Data Reviewed: CBC: Recent Labs  Lab 02/01/18 2328 02/02/18 0343 02/02/18 0524 02/03/18 0442  WBC 12.7*  --  11.8* 9.8  NEUTROABS 6.0  --  9.3*  --   HGB 15.0 13.9 13.5 13.1  HCT 50.9 41.0 43.3 41.0  MCV 96.0  --  92.5 91.1  PLT 319  --  258 706   Basic Metabolic Panel: Recent Labs  Lab 02/01/18 2328 02/02/18 0343 02/02/18 0524 02/03/18 0442  NA 141 139 143 140  K 3.4* 4.8 4.5 4.1  CL 104 106 105 103  CO2 19*  --  26 26  GLUCOSE 218* 103* 146* 108*  BUN 20 26* 23* 25*  CREATININE 1.30* 1.30* 1.47* 1.18  CALCIUM 9.1  --  8.9 8.9   MG 2.0  --   --   --    GFR: Estimated Creatinine Clearance: 93.3 mL/min (by C-G formula based on SCr of 1.18 mg/dL). Liver Function Tests: Recent Labs  Lab 02/02/18 0524  AST 39  ALT 58*  ALKPHOS 67  BILITOT 0.9  PROT 6.7  ALBUMIN 3.8   No results for input(s): LIPASE, AMYLASE in the last 168 hours. No results for input(s): AMMONIA in the last 168 hours. Coagulation Profile: Recent Labs  Lab 02/01/18 2328  INR 1.09   Cardiac Enzymes: Recent Labs  Lab 02/01/18 2328 02/02/18 0357 02/02/18 0524 02/02/18 1205 02/02/18 1710  TROPONINI 0.03* 0.34* 0.46* 0.60* 0.38*   BNP (last 3 results) No results for input(s): PROBNP in the last 8760 hours. HbA1C: Recent Labs    02/02/18 0524  HGBA1C 5.7*   CBG: No results for input(s): GLUCAP in the last 168 hours. Lipid Profile: No results for input(s): CHOL, HDL, LDLCALC, TRIG, CHOLHDL, LDLDIRECT in the last 72 hours. Thyroid Function Tests: Recent Labs    02/02/18 0524  TSH  1.552   Anemia Panel: No results for input(s): VITAMINB12, FOLATE, FERRITIN, TIBC, IRON, RETICCTPCT in the last 72 hours. Urine analysis: No results found for: COLORURINE, APPEARANCEUR, LABSPEC, PHURINE, GLUCOSEU, HGBUR, BILIRUBINUR, KETONESUR, PROTEINUR, UROBILINOGEN, NITRITE, LEUKOCYTESUR Sepsis Labs: @LABRCNTIP (procalcitonin:4,lacticidven:4)  )No results found for this or any previous visit (from the past 240 hour(s)).    Studies: No results found.  Scheduled Meds: . aspirin EC  81 mg Oral Daily  . atorvastatin  10 mg Oral Daily  . carvedilol  3.125 mg Oral BID WC  . enalapril  40 mg Oral QHS  . furosemide      . gabapentin  300 mg Oral TID  .  morphine injection  2 mg Intravenous Once  . nicotine  14 mg Transdermal Daily  . nitroGLYCERIN      . sodium chloride flush  3 mL Intravenous Q12H    Continuous Infusions: . sodium chloride    . sodium chloride    . sodium chloride    . heparin 1,650 Units/hr (02/03/18 1050)  .  nitroGLYCERIN 5 mcg/min (02/03/18 1621)     LOS: 1 day     Desiree Hane, MD [facility] Pager [phone]  If 7PM-7AM, please contact night-coverage [email or web address] Password [email or web address] 02/03/2018, 5:04 PM

## 2018-02-03 NOTE — Care Management Note (Signed)
Case Management Note  Patient Details  Name: Russell Collier MRN: [id number] Date of Birth: 1960-10-23  Subjective/Objective:   Acute Resp Failure with Hypoxia                Action/Plan: Patient lives at home; PCP: [facility]; has private insurance with Medicaid with prescription drug coverage; SW consulted for polysubstance abuse.  Expected Discharge Date:      Possibly 02/06/2018            Expected Discharge Plan:  Home/Self Care  In-House Referral:   SW  Discharge planning Services  CM Consult    Status of Service:  In process, will continue to follow  Sherrilyn Rist [phone] 02/03/2018, 10:22 AM

## 2018-02-03 NOTE — Plan of Care (Signed)
  Problem: Activity: Goal: Risk for activity intolerance will decrease Outcome: Completed/Met   Problem: Nutrition: Goal: Adequate nutrition will be maintained Outcome: Completed/Met   Problem: Coping: Goal: Level of anxiety will decrease Outcome: Completed/Met   Problem: Pain Managment: Goal: General experience of comfort will improve Outcome: Completed/Met   Problem: Safety: Goal: Ability to remain free from injury will improve Outcome: Completed/Met

## 2018-02-03 NOTE — Plan of Care (Signed)
  Problem: Health Behavior/Discharge Planning: Goal: Ability to manage health-related needs will improve Outcome: Progressing   Problem: Clinical Measurements: Goal: Ability to maintain clinical measurements within normal limits will improve Outcome: Progressing Goal: Will remain free from infection Outcome: Progressing Goal: Diagnostic test results will improve Outcome: Progressing Goal: Respiratory complications will improve Outcome: Progressing Goal: Cardiovascular complication will be avoided Outcome: Progressing   Problem: Elimination: Goal: Will not experience complications related to bowel motility Outcome: Progressing Goal: Will not experience complications related to urinary retention Outcome: Progressing   Problem: Skin Integrity: Goal: Risk for impaired skin integrity will decrease Outcome: Progressing   

## 2018-02-03 NOTE — Progress Notes (Signed)
ANTICOAGULATION CONSULT NOTE - Follow Up Consult  Pharmacy Consult for Heparin Indication: chest pain/ACS  No Known Allergies  Patient Measurements: Height: 5\' 10"  (177.8 cm) Weight: 298 lb 4.8 oz (135.3 kg) IBW/kg (Calculated) : 73 Heparin Dosing Weight: 105 kg  Vital Signs: Temp: 97.8 F (36.6 C) (12/13 0808) Temp Source: Oral (12/13 0808) BP: 136/78 (12/13 0808) Pulse Rate: 55 (12/13 0808)  Labs: Recent Labs    02/01/18 2328 02/02/18 0343  02/02/18 0524 02/02/18 1205 02/02/18 1710 02/03/18 0442  HGB 15.0 13.9  --  13.5  --   --  13.1  HCT 50.9 41.0  --  43.3  --   --  41.0  PLT 319  --   --  258  --   --  235  LABPROT 14.0  --   --   --   --   --   --   INR 1.09  --   --   --   --   --   --   HEPARINUNFRC  --   --   --   --  0.43  --  0.41  CREATININE 1.30* 1.30*  --  1.47*  --   --  1.18  TROPONINI 0.03*  --    < > 0.46* 0.60* 0.38*  --    < > = values in this interval not displayed.    Estimated Creatinine Clearance: 93.3 mL/min (by C-G formula based on SCr of 1.18 mg/dL).  Assessment:  57 yr old male on heparin for r/o ACS.     Heparin level remains therapeutic (0.41) on 1650 units/hr.  Goal of Therapy:  Heparin level 0.3-0.7 units/ml Monitor platelets by anticoagulation protocol: Yes   Plan:   Continue heparin drip at 1650 units/hr.  Daily heparin level and CBC.  Follow up plans.  Arty Baumgartner, [place] Pager: [phone] or phone: [phone] 02/03/2018,10:30 AM

## 2018-02-03 NOTE — Progress Notes (Signed)
Feeling much better after SL NTG and IV NTG gtt as well as lasix. I am concerned that he is having transient ischemia resulting in LV dysfunction and flash pulmonary edema.  He says he started with CP this afternoon that came on suddenly with tingling in his left arm and then became acutely SOB with rales bilaterally on exam.  Bp markedly elevated.  Sx resolved after IV lasix and IV NTG and now resting comfortably.  Discussed with Dr. Tamala Julian and since EKG shows no ischemic changes and it is late in the day would plan to tune up over the weekend and plan for left heart cath on Monday.  Will transfer to stepdown and continue on IV Heparin and NTG gtt as well as ASA, BB and statin.

## 2018-02-04 DIAGNOSIS — I248 Other forms of acute ischemic heart disease: Secondary | ICD-10-CM

## 2018-02-04 DIAGNOSIS — Z720 Tobacco use: Secondary | ICD-10-CM

## 2018-02-04 DIAGNOSIS — I5031 Acute diastolic (congestive) heart failure: Secondary | ICD-10-CM

## 2018-02-04 LAB — BASIC METABOLIC PANEL
Anion gap: 9 (ref 5–15)
BUN: 20 mg/dL (ref 6–20)
CO2: 28 mmol/L (ref 22–32)
Calcium: 9.1 mg/dL (ref 8.9–10.3)
Chloride: 102 mmol/L (ref 98–111)
Creatinine, Ser: 0.99 mg/dL (ref 0.61–1.24)
GFR calc Af Amer: >60 mL/min (ref >60)
GFR calc non Af Amer: >60 mL/min (ref >60)
Glucose, Bld: 129 mg/dL — ABNORMAL HIGH (ref 70–99)
POTASSIUM: 3.9 mmol/L (ref 3.5–5.1)
Sodium: 139 mmol/L (ref 135–145)

## 2018-02-04 LAB — CBC
HCT: 41.9 % (ref 39.0–52.0)
Hemoglobin: 12.9 g/dL — ABNORMAL LOW (ref 13.0–17.0)
MCH: 28.2 pg (ref 26.0–34.0)
MCHC: 30.8 g/dL (ref 30.0–36.0)
MCV: 91.7 fL (ref 80.0–100.0)
PLATELETS: 237 10*3/uL (ref 150–400)
RBC: 4.57 MIL/uL (ref 4.22–5.81)
RDW: 15.4 % (ref 11.5–15.5)
WBC: 10.2 10*3/uL (ref 4.0–10.5)
nRBC: 0 % (ref 0.0–0.2)

## 2018-02-04 LAB — HEPARIN LEVEL (UNFRACTIONATED)
Heparin Unfractionated: 0.27 IU/mL — ABNORMAL LOW (ref 0.30–0.70)
Heparin Unfractionated: 0.47 IU/mL (ref 0.30–0.70)

## 2018-02-04 MED ORDER — SENNOSIDES-DOCUSATE SODIUM 8.6-50 MG PO TABS
2.0000 | ORAL_TABLET | Freq: Two times a day (BID) | ORAL | Status: DC
  Administered 2018-02-04 – 2018-02-06 (×5): 2 via ORAL
  Filled 2018-02-04 (×6): qty 2

## 2018-02-04 MED ORDER — CARVEDILOL 12.5 MG PO TABS
12.5000 mg | ORAL_TABLET | Freq: Two times a day (BID) | ORAL | Status: DC
  Administered 2018-02-04 – 2018-02-07 (×5): 12.5 mg via ORAL
  Filled 2018-02-04 (×5): qty 1

## 2018-02-04 MED ORDER — POLYETHYLENE GLYCOL 3350 17 G PO PACK
17.0000 g | PACK | Freq: Every day | ORAL | Status: DC
  Administered 2018-02-04: 17 g via ORAL
  Filled 2018-02-04 (×2): qty 1

## 2018-02-04 NOTE — Progress Notes (Signed)
Progress Note  Patient Name: Russell Collier Date of Encounter: 02/04/2018  Primary Cardiologist: Fransico Him, MD   Subjective   Comfortable, no significant chest pain.  Shortness of breath is improving but not at baseline was having quite a bit of chest pain and shortness of breath yesterday.  EF 55 to 60%.  Markedly elevated blood pressures yesterday.  Mildly elevated troponin.  Inpatient Medications    Scheduled Meds: . aspirin EC  81 mg Oral Daily  . atorvastatin  10 mg Oral Daily  . carvedilol  3.125 mg Oral BID WC  . enalapril  40 mg Oral QHS  . gabapentin  300 mg Oral TID  .  morphine injection  2 mg Intravenous Once  . nicotine  14 mg Transdermal Daily  . polyethylene glycol  17 g Oral Daily  . senna-docusate  2 tablet Oral BID  . sodium chloride flush  3 mL Intravenous Q12H   Continuous Infusions: . sodium chloride    . sodium chloride    . heparin 1,800 Units/hr (02/04/18 1042)  . nitroGLYCERIN 5 mcg/min (02/03/18 1621)   PRN Meds: sodium chloride, acetaminophen **OR** acetaminophen, nitroGLYCERIN, ondansetron **OR** ondansetron (ZOFRAN) IV, sodium chloride flush   Vital Signs    Vitals:   02/03/18 1910 02/03/18 2301 02/04/18 0246 02/04/18 0759  BP: (!) 130/96 122/75 (!) 153/97 (!) 157/84  Pulse: 78 84 91 77  Resp: (!) 25 16 19 20   Temp: 98.3 F (36.8 C) 99 F (37.2 C) 98.2 F (36.8 C) 98.2 F (36.8 C)  TempSrc: Oral Oral Oral Oral  SpO2: 98% 100% 97% 99%  Weight: 133.5 kg  133.9 kg   Height: 5\' 10"  (1.778 m)       Intake/Output Summary (Last 24 hours) at 02/04/2018 1108 Last data filed at 02/04/2018 0955 Gross per 24 hour  Intake 863.08 ml  Output 1501 ml  Net -637.92 ml   Filed Weights   02/03/18 0410 02/03/18 1910 02/04/18 0246  Weight: 135.3 kg 133.5 kg 133.9 kg    Telemetry    No adverse arrhythmias- Personally Reviewed  ECG    Specific ST-T wave changes V1 and V2- Personally Reviewed  Physical Exam   GEN: No acute distress.    Neck: No JVD Cardiac: RRR, no murmurs, rubs, or gallops.  Respiratory: Clear to auscultation bilaterally. GI: Soft, nontender, non-distended  MS: No edema; No deformity. Neuro:  Nonfocal  Psych: Normal affect   Labs    Chemistry Recent Labs  Lab 02/02/18 0524 02/03/18 0442 02/04/18 0240  NA 143 140 139  K 4.5 4.1 3.9  CL 105 103 102  CO2 26 26 28   GLUCOSE 146* 108* 129*  BUN 23* 25* 20  CREATININE 1.47* 1.18 0.99  CALCIUM 8.9 8.9 9.1  PROT 6.7  --   --   ALBUMIN 3.8  --   --   AST 39  --   --   ALT 58*  --   --   ALKPHOS 67  --   --   BILITOT 0.9  --   --   GFRNONAA 51* >60 >60  GFRAA 60* >60 >60  ANIONGAP 12 11 9      Hematology Recent Labs  Lab 02/02/18 0524 02/03/18 0442 02/04/18 0240  WBC 11.8* 9.8 10.2  RBC 4.68 4.50 4.57  HGB 13.5 13.1 12.9*  HCT 43.3 41.0 41.9  MCV 92.5 91.1 91.7  MCH 28.8 29.1 28.2  MCHC 31.2 32.0 30.8  RDW 15.3 15.3 15.4  PLT 258 235 237    Cardiac Enzymes Recent Labs  Lab 02/02/18 0357 02/02/18 0524 02/02/18 1205 02/02/18 1710  TROPONINI 0.34* 0.46* 0.60* 0.38*    Recent Labs  Lab 02/02/18 0005 02/02/18 0341  TROPIPOC 0.04 0.39*     BNP Recent Labs  Lab 02/01/18 2328  BNP 104.4*     DDimer  Recent Labs  Lab 02/01/18 2328  DDIMER 1.81*     Radiology    No results found.  Cardiac Studies   Echocardiogram 02/03/2018- - Left ventricle: The cavity size was normal. There was mild   concentric hypertrophy. Systolic function was normal. The   estimated ejection fraction was in the range of 55% to 60%.   Although no diagnostic regional wall motion abnormality was   identified, this possibility cannot be completely excluded on the   basis of this study. Features are consistent with a pseudonormal   left ventricular filling pattern, with concomitant abnormal   relaxation and increased filling pressure (grade 2 diastolic   dysfunction). - Left atrium: The atrium was mildly dilated.  Impressions:  -  Normal LV EF without any significant wall motion abnormalities.   Grade 2 diastolic dysfunction.  Patient Profile     57 y.o. male with mildly elevated troponin rise and fall in the setting of severe hypertension, nonspecific ST-T wave changes on ECG awaiting cardiac catheterization Monday.  Assessment & Plan    Elevated troponin - Chest pain and nonspecific EKG changes noted, some atypical features, palpation of chest wall for instance. -Troponin is mildly elevated and could represent demand ischemia in the setting of hypertension.  However given his underlying chest discomfort cardiac catheterization will be pursued Monday.  There is not appear to be any coronary calcification on his CT angiogram performed on 11/02/2017 or on CT angiogram performed on 02/02/2018  Acute kidney injury -Improved creatinine.  IV contrast.  Continue to monitor.  Essential hypertension - Likely a degree of diastolic heart failure with normal EF, excessive hypertension.  Lasix was helpful on admission, currently on hold.  Continuing with beta-blocker (will increase carvedilol to 12.5 mg twice a day)and angiotensin receptor blocker.  Hypoxia - Saturations in the 70s when EMS arrived, oxygen therapy, BiPAP and Lasix with improvement.  Also has a history of asthma.  This could have been heart failure  Morbid obesity -Continue to encourage weight loss.     For questions or updates, please contact [place] Please consult [email or web address] for contact info under        Signed, Candee Furbish, MD  02/04/2018, 11:08 AM

## 2018-02-04 NOTE — Progress Notes (Signed)
ANTICOAGULATION CONSULT NOTE - Follow Up Consult  Pharmacy Consult for heparin Indication: chest pain/ACS  Labs: Recent Labs    02/01/18 2328  02/02/18 0524  02/02/18 1205 02/02/18 1710 02/03/18 0442 02/04/18 0240 02/04/18 1008  HGB 15.0   < > 13.5  --   --   --  13.1 12.9*  --   HCT 50.9   < > 43.3  --   --   --  41.0 41.9  --   PLT 319  --  258  --   --   --  235 237  --   LABPROT 14.0  --   --   --   --   --   --   --   --   INR 1.09  --   --   --   --   --   --   --   --   HEPARINUNFRC  --   --   --    < > 0.43  --  0.41 0.27* 0.47  CREATININE 1.30*   < > 1.47*  --   --   --  1.18 0.99  --   TROPONINI 0.03*   < > 0.46*  --  0.60* 0.38*  --   --   --    < > = values in this interval not displayed.    Assessment: 57yo male subtherapeutic on heparin after two levels at goal. Heparin infusion increased early this morning and is now therapeutic on 1800 units/hr, CBC stable.  Goal of Therapy:  Heparin level 0.3-0.7 units/ml   Plan:  Continue heparin 1800 units/hr Recheck heparin level with daily labs Cath on Monday  Arrie Senate, PharmD, BCPS Clinical Pharmacist [phone] Please check AMION for all [place] numbers 02/04/2018

## 2018-02-04 NOTE — Progress Notes (Signed)
PROGRESS NOTE  Issai Werling YWV:[id number] DOB: April 08, 1960 DOA: 02/01/2018 PCP: [facility]  HPI/Brief Narrative  Russell Collier is a 57 y.o. year old male with medical history HTN, HLD, DM (untreated), BPH, GERD who presented on 02/01/2018 with several months of progressive orthopnea, paroxysmal nocturnal dyspnea, lower extremity swelling with significant worsening in the past few days and was found to have vascular congestion consistent with pulmonary edema concerning for CHF exacerbation (new diagnosis).  Initially on arrival was tachypneic to high 20s to 40s, blood pressure 183/126.  His initial troponin was 0.03 and trended upwards to 0.46 prompting cardiology consultation for ischemic evaluation.  Patient was started on heparin infusion and given IV Lasix for presumed CHF exacerbation.  Required BiPAP while in ED but was able to transition to 2 L oxygen and remained stable for several hours in the emergency department on that supplementation.  Patient's breathing status improved with IV Lasix with plan to hold on further diuresis to preserve kidney function on 12/13.  His TTE on 12/13 showed preserved EF but cannot rule out wall motion normalities.  Cardiology had planned for ischemic valuation with stress test.  However patient was found to have significant chest pain with associated numbness and tingling in hand and shortness of breath at 3 PM.  He was found to have an elevated blood pressure of 170/107. He was given IV lasix and started on IV nitro glycerin drip for blood pressure control and chest pain alleviation.  His chest pain improved significantly with nitro and better control of his blood pressure.  He is continued IV Lasix for treatment of flash pulmonary edema with guidance by cardiology.  Plan to continue diuresis and blood pressure control with potential for left heart cath on 12/16.   Subjective No chest pain currently Feels breathing is much  better  Assessment/Plan:  #Typical chest pain with slightly elevated troponin, improved.  Troponin has now peaked, unable to assess wall motion normalities on TTE.  Initially plan for cardiac cath however chest pain has greatly improved with aggressive treatment of blood pressure with nitro drip.  We will continue heparin drip.  Continue IV diuresis and monitor.  Cardiology plans for potential heart cath on 02/06/18.  Continue to monitor in stepdown unit  #New acute diastolic exacerbation, improving.  No known prior history.  TTE shows preserved EF. Weight down 5 pounds so far on admission.  Continue daily weights, strict I's and O's.  Negative 1.2 L in the past 24 hours.  Much better blood pressure control as well.  Continue enalapril and Coreg.  Guidance of diuresis by cardiology (status post IV Lasix 40 mg x 1 on 12/13, carefully monitor kidney function)  #UDS positive for opioids/cocaine.  Patient denies any cocaine consumption.  Extensive counseling provided on day of admission.  #Hypertension, improving though not at goal.  Elevated to as high as 176/107 in the past 24 hours, currently 157/84.  Continue nitro drip does have low blood pressure control and chest pain management.  home enalapril to 40, and continue carvedilol at 3.25 twice daily  #Hyperlipidemia, stable.  Continue home low-dose atorvastatin, may need to increase to high intensity dosing pending cath results.  #AKI, improving.  Peak creatinine of 1.47 likely in the setting of CHF exacerbation in addition to contrast being used for CTA to rule out PE on admission.  Currently at back at his baseline (previous baseline 0.9-1).  Continue to monitor on BMP.  Avoid nephrotoxins.  #Tobacco abuse, current.  Patient is attempting tobacco cessation, previously on Chantix.  Continue nicotine patch.  Code Status: Full code  Family Communication: No family at bedside  Disposition Plan: Monitoring in stepdown unit, continue to manage blood  pressure control with nitro drip, continue to assess volume status and need for diuresis, currently chest pain-free very high risk, potential cath on 12/16.   Consultants:  Treatment Team:   [facility], Rounding, MD    Procedures:  TTE 12/13: preserved EF, unable to rule out wall motion abnormalities   Antimicrobials: Anti-infectives (From admission, onward)   None        Cultures:  none  Telemetry: yes  DVT prophylaxis:  Heparin gtt   Objective: Vitals:   02/03/18 1910 02/03/18 2301 02/04/18 0246 02/04/18 0759  BP: (!) 130/96 122/75 (!) 153/97 (!) 157/84  Pulse: 78 84 91 77  Resp: (!) 25 16 19 20   Temp: 98.3 F (36.8 C) 99 F (37.2 C) 98.2 F (36.8 C) 98.2 F (36.8 C)  TempSrc: Oral Oral Oral Oral  SpO2: 98% 100% 97% 99%  Weight: 133.5 kg  133.9 kg   Height: 5\' 10"  (1.778 m)       Intake/Output Summary (Last 24 hours) at 02/04/2018 0836 Last data filed at 02/04/2018 0804 Gross per 24 hour  Intake 623.08 ml  Output 1901 ml  Net -1277.92 ml   Filed Weights   02/03/18 0410 02/03/18 1910 02/04/18 0246  Weight: 135.3 kg 133.5 kg 133.9 kg    Exam:  Constitutional: Obese male, no distress Eyes: EOMI, anicteric, normal conjunctivae ENMT: Oropharynx with moist mucous membranes, normal dentition Cardiovascular: RRR no MRGs, scant peripheral edema lower extremities Respiratory: Normal respiratory effort on room air, no overt wheezing, no crackles at bases Abdomen: Soft,non-tender, with no HSM Skin: No rash ulcers, or lesions. Without skin tenting  Neurologic: Grossly no focal neuro deficit. Psychiatric:Appropriate affect, and mood. Mental status AAOx3  Data Reviewed: CBC: Recent Labs  Lab 02/01/18 2328 02/02/18 0343 02/02/18 0524 02/03/18 0442 02/04/18 0240  WBC 12.7*  --  11.8* 9.8 10.2  NEUTROABS 6.0  --  9.3*  --   --   HGB 15.0 13.9 13.5 13.1 12.9*  HCT 50.9 41.0 43.3 41.0 41.9  MCV 96.0  --  92.5 91.1 91.7  PLT 319  --  258 235 782    Basic Metabolic Panel: Recent Labs  Lab 02/01/18 2328 02/02/18 0343 02/02/18 0524 02/03/18 0442 02/04/18 0240  NA 141 139 143 140 139  K 3.4* 4.8 4.5 4.1 3.9  CL 104 106 105 103 102  CO2 19*  --  26 26 28   GLUCOSE 218* 103* 146* 108* 129*  BUN 20 26* 23* 25* 20  CREATININE 1.30* 1.30* 1.47* 1.18 0.99  CALCIUM 9.1  --  8.9 8.9 9.1  MG 2.0  --   --   --   --    GFR: Estimated Creatinine Clearance: 110.7 mL/min (by C-G formula based on SCr of 0.99 mg/dL). Liver Function Tests: Recent Labs  Lab 02/02/18 0524  AST 39  ALT 58*  ALKPHOS 67  BILITOT 0.9  PROT 6.7  ALBUMIN 3.8   No results for input(s): LIPASE, AMYLASE in the last 168 hours. No results for input(s): AMMONIA in the last 168 hours. Coagulation Profile: Recent Labs  Lab 02/01/18 2328  INR 1.09   Cardiac Enzymes: Recent Labs  Lab 02/01/18 2328 02/02/18 0357 02/02/18 0524 02/02/18 1205 02/02/18 1710  TROPONINI 0.03* 0.34* 0.46* 0.60* 0.38*   BNP (last  3 results) No results for input(s): PROBNP in the last 8760 hours. HbA1C: Recent Labs    02/02/18 0524  HGBA1C 5.7*   CBG: No results for input(s): GLUCAP in the last 168 hours. Lipid Profile: No results for input(s): CHOL, HDL, LDLCALC, TRIG, CHOLHDL, LDLDIRECT in the last 72 hours. Thyroid Function Tests: Recent Labs    02/02/18 0524  TSH 1.552   Anemia Panel: No results for input(s): VITAMINB12, FOLATE, FERRITIN, TIBC, IRON, RETICCTPCT in the last 72 hours. Urine analysis: No results found for: COLORURINE, APPEARANCEUR, LABSPEC, PHURINE, GLUCOSEU, HGBUR, BILIRUBINUR, KETONESUR, PROTEINUR, UROBILINOGEN, NITRITE, LEUKOCYTESUR Sepsis Labs: @LABRCNTIP (procalcitonin:4,lacticidven:4)  )No results found for this or any previous visit (from the past 240 hour(s)).    Studies: No results found.  Scheduled Meds: . aspirin EC  81 mg Oral Daily  . atorvastatin  10 mg Oral Daily  . carvedilol  3.125 mg Oral BID WC  . enalapril  40 mg Oral  QHS  . gabapentin  300 mg Oral TID  .  morphine injection  2 mg Intravenous Once  . nicotine  14 mg Transdermal Daily  . polyethylene glycol  17 g Oral Daily  . senna-docusate  2 tablet Oral BID  . sodium chloride flush  3 mL Intravenous Q12H    Continuous Infusions: . sodium chloride    . sodium chloride    . heparin 1,800 Units/hr (02/04/18 0422)  . nitroGLYCERIN 5 mcg/min (02/03/18 1621)     LOS: 2 days     Desiree Hane, MD [facility] Pager [phone]  If 7PM-7AM, please contact night-coverage [email or web address] Password [facility] 02/04/2018, 8:36 AM

## 2018-02-04 NOTE — Progress Notes (Signed)
ANTICOAGULATION CONSULT NOTE - Follow Up Consult  Pharmacy Consult for heparin Indication: chest pain/ACS  Labs: Recent Labs    02/01/18 2328  02/02/18 0524 02/02/18 1205 02/02/18 1710 02/03/18 0442 02/04/18 0240  HGB 15.0   < > 13.5  --   --  13.1 12.9*  HCT 50.9   < > 43.3  --   --  41.0 41.9  PLT 319  --  258  --   --  235 237  LABPROT 14.0  --   --   --   --   --   --   INR 1.09  --   --   --   --   --   --   HEPARINUNFRC  --   --   --  0.43  --  0.41 0.27*  CREATININE 1.30*   < > 1.47*  --   --  1.18 0.99  TROPONINI 0.03*   < > 0.46* 0.60* 0.38*  --   --    < > = values in this interval not displayed.    Assessment: 57yo male subtherapeutic on heparin after two levels at goal.  Goal of Therapy:  Heparin level 0.3-0.7 units/ml   Plan:  Will increase heparin gtt by 1 units/kg/hr to 1800 units/hr and check level in 6 hours.    Wynona Neat, PharmD, BCPS  02/04/2018,3:49 AM

## 2018-02-05 DIAGNOSIS — R825 Elevated urine levels of drugs, medicaments and biological substances: Secondary | ICD-10-CM

## 2018-02-05 DIAGNOSIS — R7989 Other specified abnormal findings of blood chemistry: Secondary | ICD-10-CM

## 2018-02-05 DIAGNOSIS — R0789 Other chest pain: Secondary | ICD-10-CM

## 2018-02-05 LAB — BASIC METABOLIC PANEL
Anion gap: 13 (ref 5–15)
BUN: 15 mg/dL (ref 6–20)
CALCIUM: 8.8 mg/dL — AB (ref 8.9–10.3)
CO2: 27 mmol/L (ref 22–32)
Chloride: 97 mmol/L — ABNORMAL LOW (ref 98–111)
Creatinine, Ser: 1.08 mg/dL (ref 0.61–1.24)
GFR calc Af Amer: >60 mL/min (ref >60)
GFR calc non Af Amer: >60 mL/min (ref >60)
Glucose, Bld: 112 mg/dL — ABNORMAL HIGH (ref 70–99)
Potassium: 3.7 mmol/L (ref 3.5–5.1)
SODIUM: 137 mmol/L (ref 135–145)

## 2018-02-05 LAB — HEPARIN LEVEL (UNFRACTIONATED): Heparin Unfractionated: 0.46 IU/mL (ref 0.30–0.70)

## 2018-02-05 LAB — CBC
HCT: 40.3 % (ref 39.0–52.0)
Hemoglobin: 12.6 g/dL — ABNORMAL LOW (ref 13.0–17.0)
MCH: 28.8 pg (ref 26.0–34.0)
MCHC: 31.3 g/dL (ref 30.0–36.0)
MCV: 92.2 fL (ref 80.0–100.0)
PLATELETS: 234 10*3/uL (ref 150–400)
RBC: 4.37 MIL/uL (ref 4.22–5.81)
RDW: 15 % (ref 11.5–15.5)
WBC: 12.4 10*3/uL — ABNORMAL HIGH (ref 4.0–10.5)
nRBC: 0 % (ref 0.0–0.2)

## 2018-02-05 MED ORDER — VALACYCLOVIR HCL 500 MG PO TABS
500.0000 mg | ORAL_TABLET | Freq: Every day | ORAL | Status: DC
  Administered 2018-02-05 – 2018-02-06 (×2): 500 mg via ORAL
  Filled 2018-02-05 (×3): qty 1

## 2018-02-05 MED ORDER — TAB-A-VITE/IRON PO TABS
1.0000 | ORAL_TABLET | Freq: Every day | ORAL | Status: DC
  Administered 2018-02-05 – 2018-02-07 (×3): 1 via ORAL
  Filled 2018-02-05 (×3): qty 1

## 2018-02-05 NOTE — Progress Notes (Signed)
Progress Note  Patient Name: Russell Collier Date of Encounter: 02/05/2018  Primary Cardiologist: Fransico Him, MD   Subjective   Shortness of breath continues to improve.  No significant chest pain.  Blood pressure improved with increase carvedilol.    Inpatient Medications    Scheduled Meds: . aspirin EC  81 mg Oral Daily  . atorvastatin  10 mg Oral Daily  . carvedilol  12.5 mg Oral BID WC  . enalapril  40 mg Oral QHS  . gabapentin  300 mg Oral TID  .  morphine injection  2 mg Intravenous Once  . multivitamins with iron  1 tablet Oral Daily  . nicotine  14 mg Transdermal Daily  . polyethylene glycol  17 g Oral Daily  . senna-docusate  2 tablet Oral BID  . sodium chloride flush  3 mL Intravenous Q12H  . valACYclovir  500 mg Oral Q1200   Continuous Infusions: . sodium chloride    . sodium chloride    . heparin 1,800 Units/hr (02/05/18 0902)  . nitroGLYCERIN Stopped (02/04/18 1142)   PRN Meds: sodium chloride, acetaminophen **OR** acetaminophen, nitroGLYCERIN, ondansetron **OR** ondansetron (ZOFRAN) IV, sodium chloride flush   Vital Signs    Vitals:   02/04/18 2140 02/04/18 2314 02/05/18 0335 02/05/18 0750  BP: 131/68 138/73 (!) 148/72 (!) 145/84  Pulse:  68 73 60  Resp:  19  20  Temp:  98.2 F (36.8 C) 98.9 F (37.2 C) 98.7 F (37.1 C)  TempSrc:  Oral Oral Oral  SpO2:  99% 97% 98%  Weight:   134.2 kg   Height:        Intake/Output Summary (Last 24 hours) at 02/05/2018 1110 Last data filed at 02/05/2018 0900 Gross per 24 hour  Intake 1221.38 ml  Output 0 ml  Net 1221.38 ml   Filed Weights   02/03/18 1910 02/04/18 0246 02/05/18 0335  Weight: 133.5 kg 133.9 kg 134.2 kg    Telemetry    No adverse arrhythmias- Personally Reviewed  ECG    No new- Personally Reviewed  Physical Exam   GEN: No acute distress.  Obese Neck: No JVD Cardiac: RRR, no murmurs, rubs, or gallops.  Respiratory: Clear to auscultation bilaterally. GI: Soft, nontender,  non-distended  MS: No edema; No deformity. Neuro:  Nonfocal  Psych: Normal affect   Labs    Chemistry Recent Labs  Lab 02/02/18 0524 02/03/18 0442 02/04/18 0240 02/05/18 0203  NA 143 140 139 137  K 4.5 4.1 3.9 3.7  CL 105 103 102 97*  CO2 26 26 28 27   GLUCOSE 146* 108* 129* 112*  BUN 23* 25* 20 15  CREATININE 1.47* 1.18 0.99 1.08  CALCIUM 8.9 8.9 9.1 8.8*  PROT 6.7  --   --   --   ALBUMIN 3.8  --   --   --   AST 39  --   --   --   ALT 58*  --   --   --   ALKPHOS 67  --   --   --   BILITOT 0.9  --   --   --   GFRNONAA 51* >60 >60 >60  GFRAA 60* >60 >60 >60  ANIONGAP 12 11 9 13      Hematology Recent Labs  Lab 02/03/18 0442 02/04/18 0240 02/05/18 0203  WBC 9.8 10.2 12.4*  RBC 4.50 4.57 4.37  HGB 13.1 12.9* 12.6*  HCT 41.0 41.9 40.3  MCV 91.1 91.7 92.2  MCH 29.1 28.2 28.8  MCHC 32.0 30.8 31.3  RDW 15.3 15.4 15.0  PLT 235 237 234    Cardiac Enzymes Recent Labs  Lab 02/02/18 0357 02/02/18 0524 02/02/18 1205 02/02/18 1710  TROPONINI 0.34* 0.46* 0.60* 0.38*    Recent Labs  Lab 02/02/18 0005 02/02/18 0341  TROPIPOC 0.04 0.39*     BNP Recent Labs  Lab 02/01/18 2328  BNP 104.4*     DDimer  Recent Labs  Lab 02/01/18 2328  DDIMER 1.81*     Radiology    No results found.  Cardiac Studies   EF 55%  Patient Profile     57 y.o. male mildly elevated troponin in the setting of severe hypertension nonspecific ST-T wave changes and atypical chest pain awaiting cardiac catheterization  Assessment & Plan    Elevated troponin/atypical chest pain -Cardiac catheterization Monday.  Possibly demand ischemia in the setting of hypertension.  No coronary artery calcification noted on CT scan personally reviewed.  Acute kidney injury -Improved, prior IV contrast use.  Essential hypertension - Normal EF, likely degree of diastolic heart failure.  Continuing with carvedilol, increased yesterday to 12.5 twice a day.  ARB as well.  Hypoxia - Likely  a degree of diastolic heart failure.  Saturation 70s when EMS arrived.  BiPAP.  Lasix improvement.  Also has a history of asthma.  Morbid obesity - BMI 42.  Continue to encourage weight loss.      For questions or updates, please contact [place] Please consult [email or web address] for contact info under        Signed, Candee Furbish, MD  02/05/2018, 11:10 AM

## 2018-02-05 NOTE — H&P (View-Only) (Signed)
Progress Note  Patient Name: Russell Collier Date of Encounter: 02/05/2018  Primary Cardiologist: Fransico Him, MD   Subjective   Shortness of breath continues to improve.  No significant chest pain.  Blood pressure improved with increase carvedilol.    Inpatient Medications    Scheduled Meds: . aspirin EC  81 mg Oral Daily  . atorvastatin  10 mg Oral Daily  . carvedilol  12.5 mg Oral BID WC  . enalapril  40 mg Oral QHS  . gabapentin  300 mg Oral TID  .  morphine injection  2 mg Intravenous Once  . multivitamins with iron  1 tablet Oral Daily  . nicotine  14 mg Transdermal Daily  . polyethylene glycol  17 g Oral Daily  . senna-docusate  2 tablet Oral BID  . sodium chloride flush  3 mL Intravenous Q12H  . valACYclovir  500 mg Oral Q1200   Continuous Infusions: . sodium chloride    . sodium chloride    . heparin 1,800 Units/hr (02/05/18 0902)  . nitroGLYCERIN Stopped (02/04/18 1142)   PRN Meds: sodium chloride, acetaminophen **OR** acetaminophen, nitroGLYCERIN, ondansetron **OR** ondansetron (ZOFRAN) IV, sodium chloride flush   Vital Signs    Vitals:   02/04/18 2140 02/04/18 2314 02/05/18 0335 02/05/18 0750  BP: 131/68 138/73 (!) 148/72 (!) 145/84  Pulse:  68 73 60  Resp:  19  20  Temp:  98.2 F (36.8 C) 98.9 F (37.2 C) 98.7 F (37.1 C)  TempSrc:  Oral Oral Oral  SpO2:  99% 97% 98%  Weight:   134.2 kg   Height:        Intake/Output Summary (Last 24 hours) at 02/05/2018 1110 Last data filed at 02/05/2018 0900 Gross per 24 hour  Intake 1221.38 ml  Output 0 ml  Net 1221.38 ml   Filed Weights   02/03/18 1910 02/04/18 0246 02/05/18 0335  Weight: 133.5 kg 133.9 kg 134.2 kg    Telemetry    No adverse arrhythmias- Personally Reviewed  ECG    No new- Personally Reviewed  Physical Exam   GEN: No acute distress.  Obese Neck: No JVD Cardiac: RRR, no murmurs, rubs, or gallops.  Respiratory: Clear to auscultation bilaterally. GI: Soft, nontender,  non-distended  MS: No edema; No deformity. Neuro:  Nonfocal  Psych: Normal affect   Labs    Chemistry Recent Labs  Lab 02/02/18 0524 02/03/18 0442 02/04/18 0240 02/05/18 0203  NA 143 140 139 137  K 4.5 4.1 3.9 3.7  CL 105 103 102 97*  CO2 26 26 28 27   GLUCOSE 146* 108* 129* 112*  BUN 23* 25* 20 15  CREATININE 1.47* 1.18 0.99 1.08  CALCIUM 8.9 8.9 9.1 8.8*  PROT 6.7  --   --   --   ALBUMIN 3.8  --   --   --   AST 39  --   --   --   ALT 58*  --   --   --   ALKPHOS 67  --   --   --   BILITOT 0.9  --   --   --   GFRNONAA 51* >60 >60 >60  GFRAA 60* >60 >60 >60  ANIONGAP 12 11 9 13      Hematology Recent Labs  Lab 02/03/18 0442 02/04/18 0240 02/05/18 0203  WBC 9.8 10.2 12.4*  RBC 4.50 4.57 4.37  HGB 13.1 12.9* 12.6*  HCT 41.0 41.9 40.3  MCV 91.1 91.7 92.2  MCH 29.1 28.2 28.8  MCHC 32.0 30.8 31.3  RDW 15.3 15.4 15.0  PLT 235 237 234    Cardiac Enzymes Recent Labs  Lab 02/02/18 0357 02/02/18 0524 02/02/18 1205 02/02/18 1710  TROPONINI 0.34* 0.46* 0.60* 0.38*    Recent Labs  Lab 02/02/18 0005 02/02/18 0341  TROPIPOC 0.04 0.39*     BNP Recent Labs  Lab 02/01/18 2328  BNP 104.4*     DDimer  Recent Labs  Lab 02/01/18 2328  DDIMER 1.81*     Radiology    No results found.  Cardiac Studies   EF 55%  Patient Profile     57 y.o. male mildly elevated troponin in the setting of severe hypertension nonspecific ST-T wave changes and atypical chest pain awaiting cardiac catheterization  Assessment & Plan    Elevated troponin/atypical chest pain -Cardiac catheterization Monday.  Possibly demand ischemia in the setting of hypertension.  No coronary artery calcification noted on CT scan personally reviewed.  Acute kidney injury -Improved, prior IV contrast use.  Essential hypertension - Normal EF, likely degree of diastolic heart failure.  Continuing with carvedilol, increased yesterday to 12.5 twice a day.  ARB as well.  Hypoxia - Likely  a degree of diastolic heart failure.  Saturation 70s when EMS arrived.  BiPAP.  Lasix improvement.  Also has a history of asthma.  Morbid obesity - BMI 42.  Continue to encourage weight loss.      For questions or updates, please contact [place] Please consult [email or web address] for contact info under        Signed, Candee Furbish, MD  02/05/2018, 11:10 AM

## 2018-02-05 NOTE — Progress Notes (Signed)
ANTICOAGULATION CONSULT NOTE - Follow Up Consult  Pharmacy Consult for heparin Indication: chest pain/ACS  Labs: Recent Labs    02/02/18 1710  02/03/18 0442 02/04/18 0240 02/04/18 1008 02/05/18 0203  HGB  --    < > 13.1 12.9*  --  12.6*  HCT  --   --  41.0 41.9  --  40.3  PLT  --   --  235 237  --  234  HEPARINUNFRC  --    < > 0.41 0.27* 0.47 0.46  CREATININE  --   --  1.18 0.99  --  1.08  TROPONINI 0.38*  --   --   --   --   --    < > = values in this interval not displayed.    Assessment: 57yo male on IV heparin for ACS r/o. Heparin level therapeutic, CBC stable, cards planning cath tomorrow.  Goal of Therapy:  Heparin level 0.3-0.7 units/ml   Plan:  Continue heparin 1800 units/hr Recheck heparin level with daily labs Cath tomorrow  Arrie Senate, PharmD, BCPS Clinical Pharmacist Please check AMION for all [place] numbers 02/05/2018

## 2018-02-05 NOTE — Progress Notes (Signed)
PROGRESS NOTE  Russell Collier GBT:[id number] DOB: Mar 12, 1960 DOA: 02/01/2018 PCP: [facility]  HPI/Brief Narrative  Russell Collier is a 57 y.o. year old male with medical history HTN, HLD, DM (untreated), BPH, GERD who presented on 02/01/2018 with several months of progressive orthopnea, paroxysmal nocturnal dyspnea, lower extremity swelling with significant worsening in the past few days and was found to have vascular congestion consistent with pulmonary edema concerning for CHF exacerbation (new diagnosis).  Initially on arrival was tachypneic to high 20s to 40s, blood pressure 183/126.  His initial troponin was 0.03 and trended upwards to 0.46 prompting cardiology consultation for ischemic evaluation.  Patient was started on heparin infusion and given IV Lasix for presumed CHF exacerbation.  Required BiPAP while in ED but was able to transition to 2 L oxygen and remained stable for several hours in the emergency department on that supplementation.  Patient's breathing status improved with IV Lasix with plan to hold on further diuresis to preserve kidney function on 12/13.  His TTE on 12/13 showed preserved EF but cannot rule out wall motion normalities.  Cardiology had planned for ischemic valuation with stress test.  However patient was found to have significant chest pain with associated numbness and tingling in hand and shortness of breath at 3 PM.  He was found to have an elevated blood pressure of 170/107. He was given IV lasix and started on IV nitro glycerin drip for blood pressure control and chest pain alleviation.  His chest pain improved significantly with nitro and better control of his blood pressure.   Subjective Still no chest pain.  Continues to feel like his breathing is much better coughing occasionally.  Assessment/Plan:  #Typical chest pain with slightly elevated troponin, improved.  Troponin has now peaked, unable to assess wall motion normalities on TTE.   Cardiology plans for heart catheterization on 12/16.  Continue to monitor in stepdown unit  #New acute diastolic exacerbation, improving.  No known prior history.  TTE shows preserved EF.  Currently holding on diuresis given no current oxygen requirements, normal respiratory exam.  More focus on blood pressure control as mentioned below as well controlled blood pressure is likely flare.  Continue to monitor daily weights, BMP.  Strict I's and O's.  #UDS positive for opioids/cocaine.  Patient denies any cocaine consumption.  Extensive counseling provided on day of admission.  #Hypertension, much improved control.  Better control with increase of carvedilol to 12.5 mg twice daily on 12/14.  Continue enalapril 40 mg.  Nitro drip no longer on board discontinue on 12/14.  Continue to monitor.  Awaiting heart cath as mentioned above.   #Hyperlipidemia, stable.  Continue home low-dose atorvastatin, may need to increase to high intensity dosing pending cath results.  #AKI, improving.  Peak creatinine of 1.47 likely in the setting of CHF exacerbation in addition to contrast being used for CTA to rule out PE on admission.  Currently at back at his baseline (previous baseline 0.9-1).  Continue to monitor on BMP.  Avoid nephrotoxins.  #Tobacco abuse, current.  Patient is attempting tobacco cessation, previously on Chantix.  Continue nicotine patch.  Code Status: Full code  Family Communication: No family at bedside  Disposition Plan: Monitoring in stepdown unit, continue to manage blood pressure control assess volume status, pending potential cath on 12/16.  Consultants:  Treatment Team:   Lbcardiology, Rounding, MD    Procedures:  TTE 12/13: preserved EF, unable to rule out wall motion abnormalities   Antimicrobials: Anti-infectives (From  admission, onward)   Start     Dose/Rate Route Frequency Ordered Stop   02/05/18 1200  valACYclovir (VALTREX) tablet 500 mg     500 mg Oral Daily 02/05/18  0844          Cultures:  none  Telemetry: yes  DVT prophylaxis:  Heparin gtt   Objective: Vitals:   02/04/18 2314 02/05/18 0335 02/05/18 0750 02/05/18 1222  BP: 138/73 (!) 148/72 (!) 145/84 127/70  Pulse: 68 73 60 67  Resp: 19  20 (!) 23  Temp: 98.2 F (36.8 C) 98.9 F (37.2 C) 98.7 F (37.1 C) 98 F (36.7 C)  TempSrc: Oral Oral Oral Oral  SpO2: 99% 97% 98% 100%  Weight:  134.2 kg    Height:        Intake/Output Summary (Last 24 hours) at 02/05/2018 1505 Last data filed at 02/05/2018 1100 Gross per 24 hour  Intake 1035.33 ml  Output 0 ml  Net 1035.33 ml   Filed Weights   02/03/18 1910 02/04/18 0246 02/05/18 0335  Weight: 133.5 kg 133.9 kg 134.2 kg    Exam:  Constitutional: Obese male, no distress Eyes: EOMI, anicteric, normal conjunctivae ENMT: Oropharynx with moist mucous membranes, normal dentition Cardiovascular: RRR no MRGs, scant peripheral edema lower extremities Respiratory: Normal respiratory effort on room air, no overt wheezing, no crackles at bases Abdomen: Soft,non-tender, normal bowel sounds Skin: No rash ulcers, or lesions. Without skin tenting  Neurologic: Grossly no focal neuro deficit. Psychiatric:Appropriate affect, and mood. Mental status AAOx3  Data Reviewed: CBC: Recent Labs  Lab 02/01/18 2328 02/02/18 0343 02/02/18 0524 02/03/18 0442 02/04/18 0240 02/05/18 0203  WBC 12.7*  --  11.8* 9.8 10.2 12.4*  NEUTROABS 6.0  --  9.3*  --   --   --   HGB 15.0 13.9 13.5 13.1 12.9* 12.6*  HCT 50.9 41.0 43.3 41.0 41.9 40.3  MCV 96.0  --  92.5 91.1 91.7 92.2  PLT 319  --  258 235 237 973   Basic Metabolic Panel: Recent Labs  Lab 02/01/18 2328 02/02/18 0343 02/02/18 0524 02/03/18 0442 02/04/18 0240 02/05/18 0203  NA 141 139 143 140 139 137  K 3.4* 4.8 4.5 4.1 3.9 3.7  CL 104 106 105 103 102 97*  CO2 19*  --  26 26 28 27   GLUCOSE 218* 103* 146* 108* 129* 112*  BUN 20 26* 23* 25* 20 15  CREATININE 1.30* 1.30* 1.47* 1.18 0.99  1.08  CALCIUM 9.1  --  8.9 8.9 9.1 8.8*  MG 2.0  --   --   --   --   --    GFR: Estimated Creatinine Clearance: 101.6 mL/min (by C-G formula based on SCr of 1.08 mg/dL). Liver Function Tests: Recent Labs  Lab 02/02/18 0524  AST 39  ALT 58*  ALKPHOS 67  BILITOT 0.9  PROT 6.7  ALBUMIN 3.8   No results for input(s): LIPASE, AMYLASE in the last 168 hours. No results for input(s): AMMONIA in the last 168 hours. Coagulation Profile: Recent Labs  Lab 02/01/18 2328  INR 1.09   Cardiac Enzymes: Recent Labs  Lab 02/01/18 2328 02/02/18 0357 02/02/18 0524 02/02/18 1205 02/02/18 1710  TROPONINI 0.03* 0.34* 0.46* 0.60* 0.38*   BNP (last 3 results) No results for input(s): PROBNP in the last 8760 hours. HbA1C: No results for input(s): HGBA1C in the last 72 hours. CBG: No results for input(s): GLUCAP in the last 168 hours. Lipid Profile: No results for input(s): CHOL, HDL,  LDLCALC, TRIG, CHOLHDL, LDLDIRECT in the last 72 hours. Thyroid Function Tests: No results for input(s): TSH, T4TOTAL, FREET4, T3FREE, THYROIDAB in the last 72 hours. Anemia Panel: No results for input(s): VITAMINB12, FOLATE, FERRITIN, TIBC, IRON, RETICCTPCT in the last 72 hours. Urine analysis: No results found for: COLORURINE, APPEARANCEUR, LABSPEC, PHURINE, GLUCOSEU, HGBUR, BILIRUBINUR, KETONESUR, PROTEINUR, UROBILINOGEN, NITRITE, LEUKOCYTESUR Sepsis Labs: @LABRCNTIP (procalcitonin:4,lacticidven:4)  )No results found for this or any previous visit (from the past 240 hour(s)).    Studies: No results found.  Scheduled Meds: . aspirin EC  81 mg Oral Daily  . atorvastatin  10 mg Oral Daily  . carvedilol  12.5 mg Oral BID WC  . enalapril  40 mg Oral QHS  . gabapentin  300 mg Oral TID  .  morphine injection  2 mg Intravenous Once  . multivitamins with iron  1 tablet Oral Daily  . nicotine  14 mg Transdermal Daily  . polyethylene glycol  17 g Oral Daily  . senna-docusate  2 tablet Oral BID  . sodium  chloride flush  3 mL Intravenous Q12H  . valACYclovir  500 mg Oral Q1200    Continuous Infusions: . sodium chloride    . sodium chloride    . heparin 1,800 Units/hr (02/05/18 0902)  . nitroGLYCERIN Stopped (02/04/18 1142)     LOS: 3 days     Desiree Hane, MD [facility] Pager [phone]  If 7PM-7AM, please contact night-coverage [email or web address] Password [email or web address] 02/05/2018, 3:05 PM

## 2018-02-06 ENCOUNTER — Encounter (HOSPITAL_COMMUNITY)
Admission: EM | Disposition: A | Discharge status: Home / Self Care | Payer: Self-pay | Source: Home / Self Care | Attending: Internal Medicine

## 2018-02-06 ENCOUNTER — Encounter (HOSPITAL_COMMUNITY): Payer: Self-pay | Admitting: Cardiovascular Disease

## 2018-02-06 DIAGNOSIS — I2510 Atherosclerotic heart disease of native coronary artery without angina pectoris: Secondary | ICD-10-CM

## 2018-02-06 HISTORY — PX: LEFT HEART CATH AND CORONARY ANGIOGRAPHY: CATH118249

## 2018-02-06 LAB — CBC
HCT: 40.6 % (ref 39.0–52.0)
Hemoglobin: 12.5 g/dL — ABNORMAL LOW (ref 13.0–17.0)
MCH: 28.1 pg (ref 26.0–34.0)
MCHC: 30.8 g/dL (ref 30.0–36.0)
MCV: 91.2 fL (ref 80.0–100.0)
Platelets: 219 10*3/uL (ref 150–400)
RBC: 4.45 MIL/uL (ref 4.22–5.81)
RDW: 14.9 % (ref 11.5–15.5)
WBC: 9.3 10*3/uL (ref 4.0–10.5)
nRBC: 0 % (ref 0.0–0.2)

## 2018-02-06 LAB — HEPARIN LEVEL (UNFRACTIONATED): Heparin Unfractionated: 0.44 IU/mL (ref 0.30–0.70)

## 2018-02-06 LAB — POCT ACTIVATED CLOTTING TIME: Activated Clotting Time: 147 seconds

## 2018-02-06 SURGERY — LEFT HEART CATH AND CORONARY ANGIOGRAPHY
Anesthesia: LOCAL

## 2018-02-06 MED ORDER — ASPIRIN 81 MG PO CHEW: 81.0000 mg | CHEWABLE_TABLET | Freq: Every day | ORAL | Status: DC

## 2018-02-06 MED ORDER — SODIUM CHLORIDE 0.9 % IV SOLN
INTRAVENOUS | Status: AC | PRN
  Administered 2018-02-06: 10 mL/h via INTRAVENOUS

## 2018-02-06 MED ORDER — MIDAZOLAM HCL 2 MG/2ML IJ SOLN
INTRAMUSCULAR | Status: AC
  Filled 2018-02-06: qty 2

## 2018-02-06 MED ORDER — HEPARIN (PORCINE) IN NACL 1000-0.9 UT/500ML-% IV SOLN
INTRAVENOUS | Status: AC
  Filled 2018-02-06: qty 1000

## 2018-02-06 MED ORDER — MIDAZOLAM HCL 2 MG/2ML IJ SOLN
INTRAMUSCULAR | Status: DC | PRN
  Administered 2018-02-06: 2 mg via INTRAVENOUS
  Administered 2018-02-06: 1 mg via INTRAVENOUS

## 2018-02-06 MED ORDER — SODIUM CHLORIDE 0.9% FLUSH: 3.0000 mL | INTRAVENOUS | Status: DC | PRN

## 2018-02-06 MED ORDER — AMLODIPINE BESYLATE 5 MG PO TABS
5.0000 mg | ORAL_TABLET | Freq: Every day | ORAL | Status: DC
  Administered 2018-02-06: 5 mg via ORAL
  Filled 2018-02-06 (×2): qty 1

## 2018-02-06 MED ORDER — ONDANSETRON HCL 4 MG/2ML IJ SOLN: 4.0000 mg | Freq: Four times a day (QID) | INTRAMUSCULAR | Status: DC | PRN

## 2018-02-06 MED ORDER — SODIUM CHLORIDE 0.9 % IV SOLN: INTRAVENOUS | Status: DC

## 2018-02-06 MED ORDER — LIDOCAINE HCL (PF) 1 % IJ SOLN
INTRAMUSCULAR | Status: AC
  Filled 2018-02-06: qty 30

## 2018-02-06 MED ORDER — SODIUM CHLORIDE 0.9 % IV SOLN: 250.0000 mL | INTRAVENOUS | Status: DC | PRN

## 2018-02-06 MED ORDER — SODIUM CHLORIDE 0.9% FLUSH
3.0000 mL | Freq: Two times a day (BID) | INTRAVENOUS | Status: DC
  Administered 2018-02-06 – 2018-02-07 (×2): 3 mL via INTRAVENOUS

## 2018-02-06 MED ORDER — FENTANYL CITRATE (PF) 100 MCG/2ML IJ SOLN
INTRAMUSCULAR | Status: AC
  Filled 2018-02-06: qty 2

## 2018-02-06 MED ORDER — IOHEXOL 350 MG/ML SOLN
INTRAVENOUS | Status: DC | PRN
  Administered 2018-02-06: 60 mL via INTRA_ARTERIAL

## 2018-02-06 MED ORDER — FENTANYL CITRATE (PF) 100 MCG/2ML IJ SOLN
INTRAMUSCULAR | Status: DC | PRN
  Administered 2018-02-06: 50 ug via INTRAVENOUS
  Administered 2018-02-06: 25 ug via INTRAVENOUS

## 2018-02-06 MED ORDER — ACETAMINOPHEN 325 MG PO TABS
650.0000 mg | ORAL_TABLET | ORAL | Status: DC | PRN
  Administered 2018-02-06: 650 mg via ORAL
  Filled 2018-02-06: qty 2

## 2018-02-06 MED ORDER — HEPARIN (PORCINE) IN NACL 1000-0.9 UT/500ML-% IV SOLN
INTRAVENOUS | Status: DC | PRN
  Administered 2018-02-06 (×2): 500 mL

## 2018-02-06 MED ORDER — ATORVASTATIN CALCIUM 80 MG PO TABS
80.0000 mg | ORAL_TABLET | Freq: Every day | ORAL | Status: DC
  Administered 2018-02-06: 80 mg via ORAL
  Filled 2018-02-06: qty 1

## 2018-02-06 MED ORDER — LIDOCAINE HCL (PF) 1 % IJ SOLN
INTRAMUSCULAR | Status: DC | PRN
  Administered 2018-02-06: 18 mL

## 2018-02-06 SURGICAL SUPPLY — 6 items
CATH INFINITI 5FR MULTPACK ANG (CATHETERS) ×2 IMPLANT
KIT HEART LEFT (KITS) ×2 IMPLANT
PACK CARDIAC CATHETERIZATION (CUSTOM PROCEDURE TRAY) ×2 IMPLANT
SHEATH PINNACLE 5F 10CM (SHEATH) ×2 IMPLANT
TRANSDUCER W/STOPCOCK (MISCELLANEOUS) ×2 IMPLANT
WIRE EMERALD 3MM-J .035X150CM (WIRE) ×2 IMPLANT

## 2018-02-06 NOTE — Progress Notes (Signed)
Site area: right groin a 5 french arterial sheath was removed  Site Prior to Removal:  Level 0  Pressure Applied For 20 MINUTES    Bedrest Beginning at   Manual:   Yes.    Patient Status During Pull:  stable  Post Pull Groin Site:  Level 0  Post Pull Instructions Given:  Yes.    Post Pull Pulses Present:  Yes.    Dressing Applied:  Yes.    Comments:   VS stable

## 2018-02-06 NOTE — Interval H&P Note (Signed)
Cath Lab Visit (complete for each Cath Lab visit)  Clinical Evaluation Leading to the Procedure:   ACS: No.  Non-ACS:    Anginal Classification: CCS IV  Anti-ischemic medical therapy: Minimal Therapy (1 class of medications)  Non-Invasive Test Results: No non-invasive testing performed  Prior CABG: No previous CABG      History and Physical Interval Note:  02/06/2018 7:37 AM  Russell Collier  has presented today for surgery, with the diagnosis of pos ST  The various methods of treatment have been discussed with the patient and family. After consideration of risks, benefits and other options for treatment, the patient has consented to  Procedure(s): LEFT HEART CATH AND CORONARY ANGIOGRAPHY (N/A) as a surgical intervention .  The patient's history has been reviewed, patient examined, no change in status, stable for surgery.  I have reviewed the patient's chart and labs.  Questions were answered to the patient's satisfaction.     Shelva Majestic

## 2018-02-06 NOTE — Progress Notes (Signed)
Progress Note  Patient Name: Russell Collier Date of Encounter: 02/06/2018  Primary Cardiologist: Fransico Him, MD   Subjective   Seen post cath, reviewed results with him. Discussed tobacco cessation, blood pressure control, and lifestyle modification at length.   Inpatient Medications    Scheduled Meds: . amLODipine  5 mg Oral Daily  . aspirin EC  81 mg Oral Daily  . atorvastatin  80 mg Oral q1800  . carvedilol  12.5 mg Oral BID WC  . enalapril  40 mg Oral QHS  . gabapentin  300 mg Oral TID  .  morphine injection  2 mg Intravenous Once  . multivitamins with iron  1 tablet Oral Daily  . nicotine  14 mg Transdermal Daily  . polyethylene glycol  17 g Oral Daily  . senna-docusate  2 tablet Oral BID  . sodium chloride flush  3 mL Intravenous Q12H  . valACYclovir  500 mg Oral Q1200   Continuous Infusions: . sodium chloride    . sodium chloride 125 mL/hr (02/06/18 0851)  . sodium chloride    . nitroGLYCERIN Stopped (02/04/18 1142)   PRN Meds: sodium chloride, acetaminophen, nitroGLYCERIN, ondansetron **OR** ondansetron (ZOFRAN) IV, sodium chloride flush   Vital Signs    Vitals:   02/06/18 0951 02/06/18 1026 02/06/18 1200 02/06/18 1245  BP: 138/72 105/90 138/83 133/80  Pulse: 64 68 70 66  Resp: 15 18 13 17   Temp:   98.3 F (36.8 C)   TempSrc:   Oral   SpO2: 100% 98% 99% 100%  Weight:      Height:        Intake/Output Summary (Last 24 hours) at 02/06/2018 1534 Last data filed at 02/06/2018 1202 Gross per 24 hour  Intake 651.08 ml  Output 425 ml  Net 226.08 ml   Filed Weights   02/04/18 0246 02/05/18 0335 02/06/18 0400  Weight: 133.9 kg 134.2 kg 133.8 kg    Telemetry    Normal sinus rhythm- Personally Reviewed  ECG    No new- Personally Reviewed  Physical Exam   GEN: No acute distress.  Obese Neck: No JVD Cardiac: RRR, no murmurs, rubs, or gallops.  Respiratory: Clear to auscultation bilaterally. GI: Soft, nontender, non-distended  MS: No  edema; No deformity. Neuro:  Nonfocal  Psych: Normal affect   Labs    Chemistry Recent Labs  Lab 02/02/18 0524 02/03/18 0442 02/04/18 0240 02/05/18 0203  NA 143 140 139 137  K 4.5 4.1 3.9 3.7  CL 105 103 102 97*  CO2 26 26 28 27   GLUCOSE 146* 108* 129* 112*  BUN 23* 25* 20 15  CREATININE 1.47* 1.18 0.99 1.08  CALCIUM 8.9 8.9 9.1 8.8*  PROT 6.7  --   --   --   ALBUMIN 3.8  --   --   --   AST 39  --   --   --   ALT 58*  --   --   --   ALKPHOS 67  --   --   --   BILITOT 0.9  --   --   --   GFRNONAA 51* >60 >60 >60  GFRAA 60* >60 >60 >60  ANIONGAP 12 11 9 13      Hematology Recent Labs  Lab 02/04/18 0240 02/05/18 0203 02/06/18 0115  WBC 10.2 12.4* 9.3  RBC 4.57 4.37 4.45  HGB 12.9* 12.6* 12.5*  HCT 41.9 40.3 40.6  MCV 91.7 92.2 91.2  MCH 28.2 28.8 28.1  MCHC 30.8  31.3 30.8  RDW 15.4 15.0 14.9  PLT 237 234 219    Cardiac Enzymes Recent Labs  Lab 02/02/18 0357 02/02/18 0524 02/02/18 1205 02/02/18 1710  TROPONINI 0.34* 0.46* 0.60* 0.38*    Recent Labs  Lab 02/02/18 0005 02/02/18 0341  TROPIPOC 0.04 0.39*     BNP Recent Labs  Lab 02/01/18 2328  BNP 104.4*     DDimer  Recent Labs  Lab 02/01/18 2328  DDIMER 1.81*     Radiology    No results found.  Cardiac Studies   Cath 02/06/18  Prox LAD lesion is 20% stenosed.  Mid Cx lesion is 60% stenosed.  Post Atrio lesion is 50% stenosed.   Mild coronary obstructive disease with 20% smooth mid LAD stenosis, 70% smooth eccentric mid AV groove circumflex stenosis, and 50% stenosis in a small proximal branch of a large PLA vessel in a dominant RCA system.  LVEPD 24 mm Hg.   RECOMMENDATION: Medical therapy.  Patient has been on carvedilol and Vasotec.  We will add amlodipine for optimal blood pressure control with target BP less than 130/80 and anti-ischemic benefit.  Weight loss and exercise is recommended.  Smoking cessation is imperative.  Patient Profile     57 y.o. male mildly  elevated troponin in the setting of severe hypertension.   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 The patient was counseled on tobacco cessation today for 8 minutes.  Counseling included reviewing the risks of smoking tobacco products, how it impacts the patient's current medical diagnoses and different strategies for quitting.  Pharmacotherapy to aid in tobacco cessation was prescribed today. -blood pressure as below -Morbid obesity, BMI >40. Weight loss recommended, interested in a weight loss clinic after discharge -aspirin 81 mg -continue atorvastatin at higher dose -on beta blocker  Acute kidney injury -Improved, but monitor tomorrow AM given contrast today. Got post cath IV fluids.  Essential hypertension - Normal EF, likely degree of diastolic heart failure. -continue amlodipine 10 mg, carvedilol 12.5 mg BID, enalapril 40 mg -Goal <130/80  For questions or updates, please contact [place] Please consult [email or web address] for contact info under     Signed, Buford Dresser, MD  02/06/2018, 3:34 PM

## 2018-02-06 NOTE — Progress Notes (Signed)
PROGRESS NOTE  Russell Collier WSF:[id number] DOB: 19-Jun-1960 DOA: 02/01/2018 PCP: [facility]  HPI/Brief Narrative  Russell Collier is a 57 y.o. year old male with medical history HTN, HLD, DM (untreated), BPH, GERD who presented on 02/01/2018 with several months of progressive orthopnea, paroxysmal nocturnal dyspnea, lower extremity swelling with significant worsening in the past few days and was found to have vascular congestion consistent with pulmonary edema concerning for CHF exacerbation (new diagnosis).  Initially on arrival was tachypneic to high 20s to 40s, blood pressure 183/126.  His initial troponin was 0.03 and trended upwards to 0.46 prompting cardiology consultation for ischemic evaluation.  Patient was started on heparin infusion and given IV Lasix for presumed CHF exacerbation.  Required BiPAP while in ED but was able to transition to 2 L oxygen and remained stable for several hours in the emergency department on that supplementation.  Patient's breathing status improved with IV Lasix with plan to hold on further diuresis to preserve kidney function on 12/13.  His TTE on 12/13 showed preserved EF but cannot rule out wall motion normalities.  Cardiology had planned for ischemic valuation with stress test.  However patient was found to have significant chest pain with associated numbness and tingling in hand and shortness of breath at 3 PM on 12/13.  He was found to have an elevated blood pressure of 170/107. He was given IV lasix and started on IV nitro glycerin drip for blood pressure control and chest pain alleviation.  His chest pain improved significantly with nitro and better control of his blood pressure.   Subjective Had no complications from recent heart cath this morning Anxious to eat. Does report some burning sensation while walking which has been ongoing for some time at home  Assessment/Plan:  #Typical chest pain with slightly elevated troponin, improved.   Heart cath on 12/16 showed nonobstructive CAD (femoral approach).  Cardiology recommends medical management with addition of amlodipine for better blood pressure control.  Addressed more below.    #New acute diastolic exacerbation, improving.  No known prior history.  TTE shows preserved EF.  Currently holding on diuresis given no current oxygen requirements, normal respiratory exam.  More focus on blood pressure control as mentioned below.  Continue to monitor daily weights, BMP.  Strict I's and O's.  #UDS positive for opioids/cocaine.  Patient denies any cocaine consumption.  Extensive counseling provided on day of admission.  #Hypertension, proving.  Cardiology recommends goal less than 130/80.  Adding amlodipine 5 mg after heart cath, continue 12.5 mg twice daily carvedilol (increased dose from home dose), and enalapril 40 mg.  Has been off nitro drip since 12/14.  Continue monitor.  #Hyperlipidemia, stable.  Continue home low-dose atorvastatin,   #AKI, resolved.  Peak creatinine of 1.47 likely in the setting of CHF exacerbation in addition to contrast being used for CTA to rule out PE on admission.  Currently  back at his baseline (previous baseline 0.9-1).  Continue to monitor on BMP.  Avoid nephrotoxins.  Burning sensation while ambulating, could possibly be claudication.  Has normal pulses on for peripheral exam without overt signs of ischemia.  Will likely need outpatient studies to evaluate by PCP  #Tobacco abuse, current.  Patient is attempting tobacco cessation, previously on Chantix.  Continue nicotine patch.  Code Status: Full code  Family Communication: No family at bedside  Disposition Plan: Monitoring vitals, chest pain and blood pressure post cath procedure (needs to lie down flat given femoral approach) anticipate discharge in  next 24 hours Consultants:  Treatment Team:   Lbcardiology, Rounding, MD    Procedures:  TTE 12/13: preserved EF, unable to rule out wall  motion abnormalities  Left heart cath 12/16: Nonobstructive CAD  Antimicrobials: Anti-infectives (From admission, onward)   Start     Dose/Rate Route Frequency Ordered Stop   02/05/18 1200  valACYclovir (VALTREX) tablet 500 mg     500 mg Oral Daily 02/05/18 0844          Cultures:  none  Telemetry: yes  DVT prophylaxis:  Heparin gtt   Objective: Vitals:   02/06/18 0951 02/06/18 1026 02/06/18 1200 02/06/18 1245  BP: 138/72 105/90 138/83 133/80  Pulse: 64 68 70 66  Resp: 15 18 13 17   Temp:   98.3 F (36.8 C)   TempSrc:   Oral   SpO2: 100% 98% 99% 100%  Weight:      Height:        Intake/Output Summary (Last 24 hours) at 02/06/2018 1333 Last data filed at 02/06/2018 1202 Gross per 24 hour  Intake 651.08 ml  Output 425 ml  Net 226.08 ml   Filed Weights   02/04/18 0246 02/05/18 0335 02/06/18 0400  Weight: 133.9 kg 134.2 kg 133.8 kg    Exam:  Constitutional: Obese male, no distress Eyes: EOMI, anicteric, normal conjunctivae ENMT: Oropharynx with moist mucous membranes, normal dentition Cardiovascular: RRR no MRGs, scant peripheral edema lower extremities Respiratory: Normal respiratory effort on room air, no overt wheezing, no crackles at bases Abdomen: Soft,non-tender, normal bowel sounds Skin: No rash ulcers, or lesions. Without skin tenting  Neurologic: Grossly no focal neuro deficit. Psychiatric:Appropriate affect, and mood. Mental status AAOx3  Data Reviewed: CBC: Recent Labs  Lab 02/01/18 2328  02/02/18 0524 02/03/18 0442 02/04/18 0240 02/05/18 0203 02/06/18 0115  WBC 12.7*  --  11.8* 9.8 10.2 12.4* 9.3  NEUTROABS 6.0  --  9.3*  --   --   --   --   HGB 15.0   < > 13.5 13.1 12.9* 12.6* 12.5*  HCT 50.9   < > 43.3 41.0 41.9 40.3 40.6  MCV 96.0  --  92.5 91.1 91.7 92.2 91.2  PLT 319  --  258 235 237 234 219   < > = values in this interval not displayed.   Basic Metabolic Panel: Recent Labs  Lab 02/01/18 2328 02/02/18 0343 02/02/18 0524  02/03/18 0442 02/04/18 0240 02/05/18 0203  NA 141 139 143 140 139 137  K 3.4* 4.8 4.5 4.1 3.9 3.7  CL 104 106 105 103 102 97*  CO2 19*  --  26 26 28 27   GLUCOSE 218* 103* 146* 108* 129* 112*  BUN 20 26* 23* 25* 20 15  CREATININE 1.30* 1.30* 1.47* 1.18 0.99 1.08  CALCIUM 9.1  --  8.9 8.9 9.1 8.8*  MG 2.0  --   --   --   --   --    GFR: Estimated Creatinine Clearance: 101.4 mL/min (by C-G formula based on SCr of 1.08 mg/dL). Liver Function Tests: Recent Labs  Lab 02/02/18 0524  AST 39  ALT 58*  ALKPHOS 67  BILITOT 0.9  PROT 6.7  ALBUMIN 3.8   No results for input(s): LIPASE, AMYLASE in the last 168 hours. No results for input(s): AMMONIA in the last 168 hours. Coagulation Profile: Recent Labs  Lab 02/01/18 2328  INR 1.09   Cardiac Enzymes: Recent Labs  Lab 02/01/18 2328 02/02/18 0357 02/02/18 0524 02/02/18 1205 02/02/18 1710  TROPONINI 0.03* 0.34* 0.46* 0.60* 0.38*   BNP (last 3 results) No results for input(s): PROBNP in the last 8760 hours. HbA1C: No results for input(s): HGBA1C in the last 72 hours. CBG: No results for input(s): GLUCAP in the last 168 hours. Lipid Profile: No results for input(s): CHOL, HDL, LDLCALC, TRIG, CHOLHDL, LDLDIRECT in the last 72 hours. Thyroid Function Tests: No results for input(s): TSH, T4TOTAL, FREET4, T3FREE, THYROIDAB in the last 72 hours. Anemia Panel: No results for input(s): VITAMINB12, FOLATE, FERRITIN, TIBC, IRON, RETICCTPCT in the last 72 hours. Urine analysis: No results found for: COLORURINE, APPEARANCEUR, LABSPEC, PHURINE, GLUCOSEU, HGBUR, BILIRUBINUR, KETONESUR, PROTEINUR, UROBILINOGEN, NITRITE, LEUKOCYTESUR Sepsis Labs: @LABRCNTIP (procalcitonin:4,lacticidven:4)  )No results found for this or any previous visit (from the past 240 hour(s)).    Studies: No results found.  Scheduled Meds: . amLODipine  5 mg Oral Daily  . aspirin EC  81 mg Oral Daily  . atorvastatin  80 mg Oral q1800  . carvedilol  12.5 mg  Oral BID WC  . enalapril  40 mg Oral QHS  . gabapentin  300 mg Oral TID  .  morphine injection  2 mg Intravenous Once  . multivitamins with iron  1 tablet Oral Daily  . nicotine  14 mg Transdermal Daily  . polyethylene glycol  17 g Oral Daily  . senna-docusate  2 tablet Oral BID  . sodium chloride flush  3 mL Intravenous Q12H  . valACYclovir  500 mg Oral Q1200    Continuous Infusions: . sodium chloride    . sodium chloride 125 mL/hr (02/06/18 0851)  . sodium chloride    . heparin Stopped (02/06/18 0640)  . nitroGLYCERIN Stopped (02/04/18 1142)     LOS: 4 days     Desiree Hane, MD [facility] Pager [phone]  If 7PM-7AM, please contact night-coverage [email or web address] Password [email or web address] 02/06/2018, 1:33 PM

## 2018-02-06 NOTE — Progress Notes (Signed)
Received patient from cath lab, bed rest began @ 0845. R femoral site level 0, stable. Patient educated on importance of bed rest and keeping R leg straight. Patient verbalized understanding. Palpable +1 pedal pulses bilaterally. Will monitor.

## 2018-02-07 LAB — BASIC METABOLIC PANEL
Anion gap: 8 (ref 5–15)
BUN: 14 mg/dL (ref 6–20)
CO2: 26 mmol/L (ref 22–32)
Calcium: 9 mg/dL (ref 8.9–10.3)
Chloride: 105 mmol/L (ref 98–111)
Creatinine, Ser: 0.95 mg/dL (ref 0.61–1.24)
GFR calc Af Amer: >60 mL/min (ref >60)
GFR calc non Af Amer: >60 mL/min (ref >60)
Glucose, Bld: 143 mg/dL — ABNORMAL HIGH (ref 70–99)
Potassium: 4.2 mmol/L (ref 3.5–5.1)
Sodium: 139 mmol/L (ref 135–145)

## 2018-02-07 LAB — CBC
HCT: 39 % (ref 39.0–52.0)
Hemoglobin: 12.4 g/dL — ABNORMAL LOW (ref 13.0–17.0)
MCH: 29.1 pg (ref 26.0–34.0)
MCHC: 31.8 g/dL (ref 30.0–36.0)
MCV: 91.5 fL (ref 80.0–100.0)
Platelets: 209 10*3/uL (ref 150–400)
RBC: 4.26 MIL/uL (ref 4.22–5.81)
RDW: 15 % (ref 11.5–15.5)
WBC: 7.7 10*3/uL (ref 4.0–10.5)
nRBC: 0 % (ref 0.0–0.2)

## 2018-02-07 MED ORDER — ATORVASTATIN CALCIUM 80 MG PO TABS: 80.0000 mg | ORAL_TABLET | Freq: Every day | ORAL | 0 refills | Status: DC

## 2018-02-07 MED ORDER — AMLODIPINE BESYLATE 10 MG PO TABS: 10.0000 mg | ORAL_TABLET | Freq: Every day | ORAL | 1 refills | Status: DC

## 2018-02-07 MED ORDER — ASPIRIN 81 MG PO TBEC: 81.0000 mg | DELAYED_RELEASE_TABLET | Freq: Every day | ORAL | 0 refills | Status: DC

## 2018-02-07 MED ORDER — CARVEDILOL 6.25 MG PO TABS: 12.5000 mg | ORAL_TABLET | Freq: Two times a day (BID) | ORAL | 0 refills | Status: DC

## 2018-02-07 MED ORDER — AMLODIPINE BESYLATE 10 MG PO TABS
10.0000 mg | ORAL_TABLET | Freq: Every day | ORAL | Status: DC
  Administered 2018-02-07: 10 mg via ORAL

## 2018-02-07 MED ORDER — NICOTINE 14 MG/24HR TD PT24: 14.0000 mg | MEDICATED_PATCH | Freq: Every day | TRANSDERMAL | 0 refills | Status: DC

## 2018-02-07 NOTE — Discharge Summary (Signed)
Discharge Summary  Russell Collier IRW:[id number] DOB: 07/20/1960  PCP: [facility]  Admit date: 02/01/2018 Discharge date: 02/07/2018   Time spent: < 25 minutes  Admitted From: home Disposition:  home  Recommendations for Outpatient Follow-up:  1. Follow up with PCP in 1 week, BMP 2. Amlodipine 10 mg (new), carvedilol 12.5 mg BID(increased dose), aspirin 81 mg(new), atorvastatin 80 mg(increased dose) 3. Follow up with Cardiology on 02/17/18    Discharge Diagnoses:  Active Hospital Problems   Diagnosis Date Noted  . Elevated troponin 02/03/2018  . Positive urine drug screen 02/03/2018  . Tobacco use 02/03/2018  . Hyperlipidemia 02/03/2018  . Acute respiratory failure with hypoxia ([place]) 02/02/2018  . Acute congestive heart failure ([place])   . Atypical chest pain 12/27/2015  . Essential hypertension     Resolved Hospital Problems  No resolved problems to display.    Discharge Condition: Stable   CODE STATUS:FULL   History of present illness:  Russell Collier is a 57 y.o. year old male with medical history HTN, HLD, DM (untreated), BPH, GERDwho presented on 12/11/2019with several months of progressive orthopnea, paroxysmal nocturnal dyspnea, lower extremity swelling with significant worsening in the past few daysand was found to havevascular congestion consistent with pulmonary edema concerning for CHF exacerbation (new diagnosis). Remaining hospital course addressed in problem based format below:   Hospital Course:   Acute diastolic exacerbation (CHF with reduced ejection fraction). Typical chest pain with elevated troponin  Initially required BiPAP while in the ED was able to transition to a supplemental oxygen.  CTA was obtained which was negative for PE.  Breathing status improved significantly with IV Lasix given concern for CHF given vascular congestion, peripheral edema.  TTE showed preserved EF with grade 2 diastolic dysfunction.    His hospital  course was complicated by worsening chest pain at rest with slightly elevated troponin.  He was monitored on nitro drip with great improvement before receiving heart cath on 12/16 which showed nonobstructive CAD.  Cardiology recommended continue medical management of his blood pressure with the addition of amlodipine for better blood pressure control.  His carvedilol was also increased to 12.5 mg twice daily.  He was continued on his home dose of enalapril 40 mg.  With better control of his blood pressure his chest pain resolved.  He did not require oxygen on discharge  Hypertensive urgency.  Presented with high blood pressure with initial readings of 183/126 in the ED. initially was improved with resumption of his home BP medications.  He did require nitro drip in setting of chest pain that occurred later in hospitalization as mentioned above.  Improved blood pressure control with addition of amlodipine, increase in carvedilol and continuation of enalapril which he will continue on discharge, goal blood pressure less than 130/80.  AKI, resolved.  Presented with creatinine of 1.47 (baseline 0.9-1).  Return to baseline with IV diuresis.  Kidney function was monitored after receiving contrast for both CTA and heart cath).  Remained at baseline on discharge  Tobacco abuse.  Patient wants to try to quit smoking nicotine patch was used during hospital stay, prescription was added on discharge.  Findings on UDS.  Cocaine positive.  Cessation counseling provided. He states he does not use, but is around it.   Burning sensation while ambulating.  Some concern for possible claudication.  Has normal pulses on peripheral exam with no oral signs of ischemia.  Advise outpatient studies to evaluate by PCP   Consultations:  Cardiology  Procedures/Studies: TTE,  12/13: Study Conclusions  - Left ventricle: The cavity size was normal. There was mild   concentric hypertrophy. Systolic function was normal. The    estimated ejection fraction was in the range of 55% to 60%.   Although no diagnostic regional wall motion abnormality was   identified, this possibility cannot be completely excluded on the   basis of this study. Features are consistent with a pseudonormal   left ventricular filling pattern, with concomitant abnormal   relaxation and increased filling pressure (grade 2 diastolic   dysfunction). - Left atrium: The atrium was mildly dilated.  Impressions:  - Normal LV EF without any significant wall motion abnormalities.   Grade 2 diastolic dysfunction.  Left heart cath, 12/16   Prox LAD lesion is 20% stenosed.  Mid Cx lesion is 60% stenosed.  Post Atrio lesion is 50% stenosed.   Mild coronary obstructive disease with 20% smooth mid LAD stenosis, 70% smooth eccentric mid AV groove circumflex stenosis, and 50% stenosis in a small proximal branch of a large PLA vessel in a dominant RCA system.  LVEPD 24 mm Hg.   RECOMMENDATION: Medical therapy.  Patient has been on carvedilol and Vasotec.  We will add amlodipine for optimal blood pressure control with target BP less than 130/80 and anti-ischemic benefit.  Weight loss and exercise is recommended.  Smoking cessation is imperative.  Discharge Exam: BP (!) 149/79 (BP Location: Left Arm)   Pulse 68   Temp 98.9 F (37.2 C) (Oral)   Resp 17   Ht 5\' 10"  (1.778 m)   Wt 134.9 kg   SpO2 98%   BMI 42.67 kg/m   General: Obese male, sitting in bedside chair, no apparent distress Eyes: EOMI, anicteric ENT: Oral Mucosa clear and moist Cardiovascular: regular rate and rhythm, no murmurs, rubs or gallops, no edema, Respiratory: Normal respiratory effort on room air, lungs clear to auscultation bilaterally Abdomen: soft, non-distended, non-tender, normal bowel sounds Skin: No Rash Neurologic: Grossly no focal neuro deficit.Mental status AAOx3, speech normal, Psychiatric:Appropriate affect, and mood   Discharge Instructions You  were cared for by a hospitalist during your hospital stay. If you have any questions about your discharge medications or the care you received while you were in the hospital after you are discharged, you can call the unit and asked to speak with the hospitalist on call if the hospitalist that took care of you is not available. Once you are discharged, your primary care physician will handle any further medical issues. Please note that NO REFILLS for any discharge medications will be authorized once you are discharged, as it is imperative that you return to your primary care physician (or establish a relationship with a primary care physician if you do not have one) for your aftercare needs so that they can reassess your need for medications and monitor your lab values.  Discharge Instructions    Diet - low sodium heart healthy   Complete by:  As directed    Increase activity slowly   Complete by:  As directed      Allergies as of 02/07/2018   No Known Allergies     Medication List    TAKE these medications   albuterol 108 (90 Base) MCG/ACT inhaler Commonly known as:  PROVENTIL HFA;VENTOLIN HFA Inhale 2 puffs into the lungs every 4 (four) hours as needed for wheezing or shortness of breath.   amLODipine 10 MG tablet Commonly known as:  NORVASC Take 1 tablet (10 mg total) by mouth  daily. Start taking on:  February 08, 2018   aspirin 81 MG EC tablet Take 1 tablet (81 mg total) by mouth daily. Start taking on:  February 08, 2018   atorvastatin 80 MG tablet Commonly known as:  LIPITOR Take 1 tablet (80 mg total) by mouth daily at 6 PM. What changed:    medication strength  how much to take  when to take this   carvedilol 6.25 MG tablet Commonly known as:  COREG Take 2 tablets (12.5 mg total) by mouth 2 (two) times daily with a meal. What changed:  how much to take   enalapril 20 MG tablet Commonly known as:  VASOTEC Take 40 mg by mouth daily.   gabapentin 300 MG  capsule Commonly known as:  NEURONTIN Take 300 mg by mouth 3 (three) times daily.   multivitamin capsule Take 1 capsule by mouth daily.   nicotine 14 mg/24hr patch Commonly known as:  NICODERM CQ - dosed in mg/24 hours Place 1 patch (14 mg total) onto the skin daily. Start taking on:  February 08, 2018   valACYclovir 500 MG tablet Commonly known as:  VALTREX Take 500 mg by mouth daily at 12 noon.      No Known Allergies Follow-up Information    Daune Perch, NP Follow up.   Specialty:  Cardiology Why:  Cardiology hospital follow up on 02/17/18 at 11:30. Please arrive at 11:15 for check in.  Contact information: [address] [place] [address] [phone]            The results of significant diagnostics from this hospitalization (including imaging, microbiology, ancillary and laboratory) are listed below for reference.    Significant Diagnostic Studies: Ct Angio Chest Pe W And/or Wo Contrast  Result Date: 02/02/2018 CLINICAL DATA:  57 year old male with respiratory distress. Concern for pulmonary embolism. EXAM: CT ANGIOGRAPHY CHEST WITH CONTRAST TECHNIQUE: Multidetector CT imaging of the chest was performed using the standard protocol during bolus administration of intravenous contrast. Multiplanar CT image reconstructions and MIPs were obtained to evaluate the vascular anatomy. CONTRAST:  138mL ISOVUE-370 IOPAMIDOL (ISOVUE-370) INJECTION 76% COMPARISON:  Chest radiograph dated 02/01/2018 FINDINGS: Cardiovascular: There is borderline cardiomegaly. There is retrograde flow of contrast from the right atrium into the IVC suggestive of a degree of right heart dysfunction. No pericardial effusion. The thoracic aorta is unremarkable. No CT evidence of pulmonary embolism. Mediastinum/Nodes: No hilar or mediastinal adenopathy. The esophagus is grossly unremarkable. No mediastinal fluid collection. Lungs/Pleura: There is diffuse ground-glass airspace density  throughout the lungs which may represent pneumonia, alveolar edema, or ARDS. Clinical correlation is recommended. There is no pleural effusion or pneumothorax. The central airways are patent. Upper Abdomen: No acute abnormality. Musculoskeletal: No chest wall abnormality. No acute or significant osseous findings. Review of the MIP images confirms the above findings. IMPRESSION: 1. No CT evidence of pulmonary embolism. 2. Diffuse ground-glass airspace density throughout the lungs may represent pneumonia, alveolar edema, or ARDS. Clinical correlation is recommended. Electronically Signed   By: Anner Crete M.D.   On: 02/02/2018 03:18   Dg Chest Portable 1 View  Result Date: 02/01/2018 CLINICAL DATA:  Shortness of breath EXAM: PORTABLE CHEST 1 VIEW COMPARISON:  11/02/2017 CT and radiograph FINDINGS: Cardiomegaly with vascular congestion. Diffuse bilateral hazy and ground-glass opacity, may reflect pulmonary edema. Patchy more confluent airspace disease at the right infrahilar lung and left base. No pneumothorax. IMPRESSION: 1. Cardiomegaly with vascular congestion and diffuse bilateral hazy and ground-glass density suspect  for pulmonary edema. 2. More confluent areas of airspace disease within the left base and right infrahilar lung may reflect superimposed pneumonia Electronically Signed   By: Donavan Foil M.D.   On: 02/01/2018 23:23    Microbiology: No results found for this or any previous visit (from the past 240 hour(s)).   Labs: Basic Metabolic Panel: Recent Labs  Lab 02/01/18 2328  02/02/18 0524 02/03/18 0442 02/04/18 0240 02/05/18 0203 02/07/18 0210  NA 141   < > 143 140 139 137 139  K 3.4*   < > 4.5 4.1 3.9 3.7 4.2  CL 104   < > 105 103 102 97* 105  CO2 19*  --  26 26 28 27 26   GLUCOSE 218*   < > 146* 108* 129* 112* 143*  BUN 20   < > 23* 25* 20 15 14   CREATININE 1.30*   < > 1.47* 1.18 0.99 1.08 0.95  CALCIUM 9.1  --  8.9 8.9 9.1 8.8* 9.0  MG 2.0  --   --   --   --   --   --     < > = values in this interval not displayed.   Liver Function Tests: Recent Labs  Lab 02/02/18 0524  AST 39  ALT 58*  ALKPHOS 67  BILITOT 0.9  PROT 6.7  ALBUMIN 3.8   No results for input(s): LIPASE, AMYLASE in the last 168 hours. No results for input(s): AMMONIA in the last 168 hours. CBC: Recent Labs  Lab 02/01/18 2328  02/02/18 0524 02/03/18 0442 02/04/18 0240 02/05/18 0203 02/06/18 0115 02/07/18 0210  WBC 12.7*  --  11.8* 9.8 10.2 12.4* 9.3 7.7  NEUTROABS 6.0  --  9.3*  --   --   --   --   --   HGB 15.0   < > 13.5 13.1 12.9* 12.6* 12.5* 12.4*  HCT 50.9   < > 43.3 41.0 41.9 40.3 40.6 39.0  MCV 96.0  --  92.5 91.1 91.7 92.2 91.2 91.5  PLT 319  --  258 235 237 234 219 209   < > = values in this interval not displayed.   Cardiac Enzymes: Recent Labs  Lab 02/01/18 2328 02/02/18 0357 02/02/18 0524 02/02/18 1205 02/02/18 1710  TROPONINI 0.03* 0.34* 0.46* 0.60* 0.38*   BNP: BNP (last 3 results) Recent Labs    02/01/18 2328  BNP 104.4*    ProBNP (last 3 results) No results for input(s): PROBNP in the last 8760 hours.  CBG: No results for input(s): GLUCAP in the last 168 hours.     Signed:  Desiree Hane, MD [facility] 02/07/2018, 9:57 AM

## 2018-02-07 NOTE — Progress Notes (Signed)
Progress Note  Patient Name: Russell Collier Date of Encounter: 02/07/2018  Primary Cardiologist: Fransico Him, MD   Subjective   Doing well this AM. Reviewed recommendations from yesterday again with him. He has no complaints today.  Inpatient Medications    Scheduled Meds: . amLODipine  10 mg Oral Daily  . aspirin EC  81 mg Oral Daily  . atorvastatin  80 mg Oral q1800  . carvedilol  12.5 mg Oral BID WC  . enalapril  40 mg Oral QHS  . gabapentin  300 mg Oral TID  .  morphine injection  2 mg Intravenous Once  . multivitamins with iron  1 tablet Oral Daily  . nicotine  14 mg Transdermal Daily  . polyethylene glycol  17 g Oral Daily  . senna-docusate  2 tablet Oral BID  . sodium chloride flush  3 mL Intravenous Q12H  . valACYclovir  500 mg Oral Q1200   Continuous Infusions: . sodium chloride    . sodium chloride 125 mL/hr (02/06/18 0851)  . sodium chloride    . nitroGLYCERIN Stopped (02/04/18 1142)   PRN Meds: sodium chloride, acetaminophen, nitroGLYCERIN, ondansetron **OR** ondansetron (ZOFRAN) IV, sodium chloride flush   Vital Signs    Vitals:   02/06/18 2350 02/07/18 0408 02/07/18 0604 02/07/18 0748  BP: (!) 155/73 (!) 148/79  (!) 149/79  Pulse: 73 65  68  Resp: 15 17  17   Temp: 98.6 F (37 C) 98.5 F (36.9 C)  98.9 F (37.2 C)  TempSrc: Oral Oral  Oral  SpO2: 97% 98%    Weight:   134.9 kg   Height:        Intake/Output Summary (Last 24 hours) at 02/07/2018 0931 Last data filed at 02/07/2018 0846 Gross per 24 hour  Intake 1690 ml  Output 425 ml  Net 1265 ml   Filed Weights   02/05/18 0335 02/06/18 0400 02/07/18 0604  Weight: 134.2 kg 133.8 kg 134.9 kg    Telemetry    Normal sinus rhythm with artifact- Personally Reviewed  ECG    No new- Personally Reviewed  Physical Exam   GEN: No acute distress.  Obese Neck: No JVD Cardiac: RRR, no murmurs, rubs, or gallops. Cath site c/d/i Respiratory: Clear to auscultation bilaterally. GI: Soft,  nontender, non-distended  MS: No edema; No deformity. Neuro:  Nonfocal  Psych: Normal affect   Labs    Chemistry Recent Labs  Lab 02/02/18 0524  02/04/18 0240 02/05/18 0203 02/07/18 0210  NA 143   < > 139 137 139  K 4.5   < > 3.9 3.7 4.2  CL 105   < > 102 97* 105  CO2 26   < > 28 27 26   GLUCOSE 146*   < > 129* 112* 143*  BUN 23*   < > 20 15 14   CREATININE 1.47*   < > 0.99 1.08 0.95  CALCIUM 8.9   < > 9.1 8.8* 9.0  PROT 6.7  --   --   --   --   ALBUMIN 3.8  --   --   --   --   AST 39  --   --   --   --   ALT 58*  --   --   --   --   ALKPHOS 67  --   --   --   --   BILITOT 0.9  --   --   --   --   GFRNONAA 51*   < > >  60 >60 >60  GFRAA 60*   < > >60 >60 >60  ANIONGAP 12   < > 9 13 8    < > = values in this interval not displayed.     Hematology Recent Labs  Lab 02/05/18 0203 02/06/18 0115 02/07/18 0210  WBC 12.4* 9.3 7.7  RBC 4.37 4.45 4.26  HGB 12.6* 12.5* 12.4*  HCT 40.3 40.6 39.0  MCV 92.2 91.2 91.5  MCH 28.8 28.1 29.1  MCHC 31.3 30.8 31.8  RDW 15.0 14.9 15.0  PLT 234 219 209    Cardiac Enzymes Recent Labs  Lab 02/02/18 0357 02/02/18 0524 02/02/18 1205 02/02/18 1710  TROPONINI 0.34* 0.46* 0.60* 0.38*    Recent Labs  Lab 02/02/18 0005 02/02/18 0341  TROPIPOC 0.04 0.39*     BNP Recent Labs  Lab 02/01/18 2328  BNP 104.4*     DDimer  Recent Labs  Lab 02/01/18 2328  DDIMER 1.81*     Radiology    No results found.  Cardiac Studies   Cath 02/06/18  Prox LAD lesion is 20% stenosed.  Mid Cx lesion is 60% stenosed.  Post Atrio lesion is 50% stenosed.   Mild coronary obstructive disease with 20% smooth mid LAD stenosis, 70% smooth eccentric mid AV groove circumflex stenosis, and 50% stenosis in a small proximal branch of a large PLA vessel in a dominant RCA system.  LVEPD 24 mm Hg.   RECOMMENDATION: Medical therapy.  Patient has been on carvedilol and Vasotec.  We will add amlodipine for optimal blood pressure control with  target BP less than 130/80 and anti-ischemic benefit.  Weight loss and exercise is recommended.  Smoking cessation is imperative.  Patient Profile     57 y.o. male mildly elevated troponin in the setting of severe hypertension.   Assessment & Plan    Elevated troponin/atypical chest pain -cath 12/16 with mild obstructive CAD -risk factor management:   -tobacco cessation: discussed at length yesterday -blood pressure as below -Morbid obesity, BMI >40. Weight loss recommended, interested in a weight loss clinic after discharge -aspirin 81 mg -continue atorvastatin at higher dose -on beta blocker  Acute kidney injury -Improved from discharge. Stable this AM after cath yesterday.  Essential hypertension - Normal EF, likely degree of diastolic heart failure. -continue amlodipine 10 mg, carvedilol 12.5 mg BID, enalapril 40 mg -Goal <130/80 -discussed at length importance of lifestyle management for hypertension. He does not want to add any additional medications at this point. He would likely benefit from spironolactone if his blood pressure remains elevated on follow up.  [facility] will sign off in anticipation of discharge.   Medication Recommendations: amlodipine (new), carvedilol (increased dose), enalapril, aspirin (new), atorvastatin (increased dose) Other recommendations (labs, testing, etc):  BMET in 1 week with PCP Follow up as an outpatient:  Appointment with Daune Perch, NP on 02/17/18 at 11:30 AM.  For questions or updates, please contact [facility] Please consult [email or web address] for contact info under     Signed, Buford Dresser, MD  02/07/2018, 9:31 AM

## 2018-02-16 ENCOUNTER — Encounter: Payer: Self-pay | Admitting: Cardiology

## 2018-02-17 ENCOUNTER — Ambulatory Visit: Payer: Medicaid Other | Admitting: Cardiology

## 2018-03-03 DIAGNOSIS — E1142 Type 2 diabetes mellitus with diabetic polyneuropathy: Secondary | ICD-10-CM | POA: Diagnosis not present

## 2018-03-03 DIAGNOSIS — D539 Nutritional anemia, unspecified: Secondary | ICD-10-CM | POA: Diagnosis not present

## 2018-03-03 DIAGNOSIS — E782 Mixed hyperlipidemia: Secondary | ICD-10-CM | POA: Diagnosis not present

## 2018-03-03 DIAGNOSIS — Z79899 Other long term (current) drug therapy: Secondary | ICD-10-CM | POA: Diagnosis not present

## 2018-03-30 ENCOUNTER — Ambulatory Visit: Payer: Medicaid Other | Admitting: Cardiology

## 2018-03-30 ENCOUNTER — Encounter: Payer: Self-pay | Admitting: Cardiology

## 2018-03-30 ENCOUNTER — Encounter (INDEPENDENT_AMBULATORY_CARE_PROVIDER_SITE_OTHER): Payer: Self-pay

## 2018-03-30 VITALS — BP 128/80 | HR 77 | Ht 72.0 in | Wt 306.8 lb

## 2018-03-30 DIAGNOSIS — G4733 Obstructive sleep apnea (adult) (pediatric): Secondary | ICD-10-CM | POA: Diagnosis not present

## 2018-03-30 DIAGNOSIS — E785 Hyperlipidemia, unspecified: Secondary | ICD-10-CM

## 2018-03-30 DIAGNOSIS — I739 Peripheral vascular disease, unspecified: Secondary | ICD-10-CM

## 2018-03-30 DIAGNOSIS — N179 Acute kidney failure, unspecified: Secondary | ICD-10-CM

## 2018-03-30 DIAGNOSIS — Z720 Tobacco use: Secondary | ICD-10-CM | POA: Diagnosis not present

## 2018-03-30 DIAGNOSIS — Z6841 Body Mass Index (BMI) 40.0 and over, adult: Secondary | ICD-10-CM

## 2018-03-30 DIAGNOSIS — I2510 Atherosclerotic heart disease of native coronary artery without angina pectoris: Secondary | ICD-10-CM | POA: Insufficient documentation

## 2018-03-30 DIAGNOSIS — I25118 Atherosclerotic heart disease of native coronary artery with other forms of angina pectoris: Secondary | ICD-10-CM | POA: Diagnosis not present

## 2018-03-30 DIAGNOSIS — I10 Essential (primary) hypertension: Secondary | ICD-10-CM

## 2018-03-30 LAB — BASIC METABOLIC PANEL
BUN/Creatinine Ratio: 14 (ref 9–20)
BUN: 11 mg/dL (ref 6–24)
CO2: 22 mmol/L (ref 20–29)
CREATININE: 0.79 mg/dL (ref 0.76–1.27)
Calcium: 9.2 mg/dL (ref 8.7–10.2)
Chloride: 102 mmol/L (ref 96–106)
GFR calc Af Amer: 114 mL/min/{1.73_m2} (ref >59)
GFR calc non Af Amer: 98 mL/min/{1.73_m2} (ref >59)
Glucose: 111 mg/dL — ABNORMAL HIGH (ref 65–99)
Potassium: 4.2 mmol/L (ref 3.5–5.2)
Sodium: 140 mmol/L (ref 134–144)

## 2018-03-30 MED ORDER — ENALAPRIL MALEATE 20 MG PO TABS: 40.0000 mg | ORAL_TABLET | Freq: Every day | ORAL | 3 refills | Status: DC

## 2018-03-30 MED ORDER — AMLODIPINE BESYLATE 10 MG PO TABS: 10.0000 mg | ORAL_TABLET | Freq: Every day | ORAL | 3 refills | Status: DC
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
## 2018-03-30 NOTE — Patient Instructions (Addendum)
Medication Instructions:  START: Aspirin 81 mg daily (Over the counter)  We have sent in refills on your Enalapril and your Amlodipine   If you need a refill on your cardiac medications before your next appointment, please call your pharmacy.   Lab work: TODAY: Arts development officer recommends that you return for a FASTING lipid profile and hepatic function test in 6 weeks    If you have labs (blood work) drawn today and your tests are completely normal, you will receive your results only by: Marland Kitchen MyChart Message (if you have MyChart) OR . A paper copy in the mail If you have any lab test that is abnormal or we need to change your treatment, we will call you to review the results.  Testing/Procedures: Your physician has requested that you have a lower extremity arterial exercise duplex with ABI's. During this test, exercise and ultrasound are used to evaluate arterial blood flow in the legs. Allow one hour for this exam. There are no restrictions or special instructions.   Follow-Up: At [facility], you and your health needs are our priority.  As part of our continuing mission to provide you with exceptional heart care, we have created designated Provider Care Teams.  These Care Teams include your primary Cardiologist (physician) and Advanced Practice Providers (APPs -  Physician Assistants and Nurse Practitioners) who all work together to provide you with the care you need, when you need it. You will need a follow up appointment in 3 months.  Please call our office 2 months in advance to schedule this appointment.  You may see Fransico Him, MD or one of the following Advanced Practice Providers on your designated Care Team:   [place], PA-C Melina Copa, PA-C . Ermalinda Barrios, PA-C  Any Other Special Instructions Will Be Listed Below (If Applicable).  -Recommend heart healthy/Mediterranean diet, with whole grains, fruits, vegetable, fish, lean meats, nuts, and olive oil. Limit  salt. -Recommend moderate walking, 3-5 times/week for 30-50 minutes each session. Aim for at least 150 minutes.week. Goal should be pace of 3 miles/hours, or walking 1.5 miles in 30 minutes -Recommend avoidance of tobacco products. Avoid excess alcohol.  Try to use CPAP. Follow up with Dr. Verdie Mosher.    Acute Coronary Syndrome  Acute coronary syndrome (ACS) is a serious problem in which there is suddenly not enough blood and oxygen reaching the heart. ACS can result in chest pain or a heart attack. This condition is a medical emergency. If you have any symptoms of this condition, get help right away. What are the causes? This condition may be caused by:  Buildup of fat and cholesterol inside of the arteries (atherosclerosis). This is the most common cause. The buildup (plaque) can cause blood vessels in the heart (coronary arteries) to become narrow or blocked, which reduces blood flow to the heart. Plaque can also break off and lead to a clot, which can block an artery and cause a heart attack or stroke.  Sudden tightening of the muscles around the coronary arteries (coronary spasm).  Tearing of a coronary artery (spontaneous coronary artery dissection).  Very low blood pressure (hypotension).  An abnormal heartbeat (arrhythmia).  Other medical conditions that cause a decrease of oxygen to the heart, such as anemiaorrespiratory failure.  Using cocaine or methamphetamine. What increases the risk? The following factors may make you more likely to develop this condition:  Age. The risk for ACS increases as you get older.  History of chest pain, heart attack,  peripheral artery disease, or stroke.  Having taken chemotherapy or immune-suppressing medicines.  Being male.  Family history of chest pain, heart disease, or stroke.  Smoking.  Not exercising enough.  Being overweight.  High cholesterol.  High blood pressure (hypertension).  Diabetes.  Excessive alcohol  use. What are the signs or symptoms? Common symptoms of this condition include:  Chest pain. The pain may last a long time, or it may stop and come back (recur). It may feel like: ? Crushing or squeezing. ? Tightness, pressure, fullness, or heaviness.  Arm, neck, jaw, or back pain.  Heartburn or indigestion.  Shortness of breath.  Nausea.  Sudden cold sweats.  Light-headedness.  Dizziness, or passing out.  Tiredness (fatigue). Sometimes there are no symptoms. How is this diagnosed? This condition may be diagnosed based on:  Your medical history and symptoms.  An electrocardiogram (ECG). This imaging test measures the heart's electrical activity.  Blood tests. Cardiac blood tests may need to be repeated at designated time intervals.  Chest X-ray.  A CT scan of the chest.  A coronary angiogram. This is a procedure in which dye is injected into the bloodstream and then X-rays are taken to show if there is a blockage in a coronary artery.  Exercise stress testing.  Echocardiography. This is a test that uses sound waves to produce detailed images of the heart. How is this treated? The treatment is to restore blood flow to the heart as soon as possible. Treatment for this condition may include:  Oxygen therapy.  Medicines, such as: ? Antiplatelet medicines and blood-thinning medicines, such as aspirin. These help prevent blood clots. ? Medicine that dissolves any blood clots (fibrinolytic therapy). ? Blood pressure medicines. ? Nitroglycerin. This helps relieve chest pain and widens blood vessels to improve blood flow. ? Pain medicine. ? Cholesterol-lowering medicine.  Surgery, such as: ? Coronary angioplasty with stent placement. This involves placing a small piece of metal that looks like mesh or a spring into a narrow coronary artery. This widens the artery and keep it open. ? Coronary artery bypass surgery. This involves taking a section of a blood vessel from a  different part of your body, and placing it on the blocked coronary artery to allow blood to flow around (bypass) the blockage.  Cardiac rehabilitation. This is a program that helps improve your health and well-being. It includes exercise training, education, and counseling to help you recover. Follow these instructions at home: Eating and drinking  Eat a heart-healthy diet that includes whole grains, fruits and vegetables, lean proteins, and low-fat or nonfat dairy products.  Limit how much salt (sodium) you eat as told by your health care provider. Follow instructions from your health care provider about any other eating or drinking restrictions, such as limiting foods that are high in fat and processed sugars.  Use healthy cooking methods such as roasting, grilling, broiling, baking, poaching, steaming, or stir-frying.  Talk with a dietitian to learn about healthy cooking methods and how to eat less sodium. Medicines  Take over-the-counter and prescription medicines only as told by your health care provider.  Do not take these medicines unless your health care provider approves: ? Vitamin supplements that contain vitamin A or vitamin E. ? Nonsteroidal anti-inflammatory drugs (NSAIDs), such as ibuprofen, naproxen, or celecoxib. ? Hormone replacement therapy that contains estrogen. If you are taking blood thinners:  Talk with your health care provider before you take any medicines that contain aspirin or NSAIDs. These medicines increase your  risk for dangerous bleeding.  Take your medicine exactly as told, at the same time every day.  Avoid activities that could cause injury or bruising, and follow instructions about how to prevent falls.  Wear a medical alert bracelet, and carry a card that lists what medicines you take. Activity  Join a cardiac rehabilitation program. An exercise plan will be developed for you.  Ask your health care provider: ? What activities and exercises are  safe for you. ? If you should follow specific instructions about lifting, driving, or climbing stairs. Lifestyle  Do not use any products that contain nicotine or tobacco, such as cigarettes and e-cigarettes. If you need help quitting, ask your health care provider.  If your health care provider says that alcohol is safe for you, limit your alcohol intake to no more than 1 drink a day. One drink equals 12 oz of beer, 5 oz of wine, or 1 oz of hard liquor.  Maintain a healthy weight. If you need to lose weight, work with your health care provider to do so safely. General instructions  Tell all the health care providers who care for you about your heart condition, including your dentist. This may affect the medicines or treatment you receive.  Manage any other health conditions you have, such as hypertension or diabetes. These conditions affect your heart.  Learn ways to manage stress.  Get screened for depression, and get mental health treatment if you need it. People with ACS are at higher risk for depression.  Keep your vaccinations up to date. Get the flu shot (influenza vaccine) every year.  If directed, monitor your blood pressure at home.  Keep all follow-up visits as told by your health care provider. This is important. Contact a health care provider if:  You feel overwhelmed or sad.  You have trouble doing your daily activities. Get help right away if:  You have pain in your chest, neck, arm, jaw, stomach, or back that recurs, and: ? It lasts for more than a few minutes. ? It is not relieved by taking the [name] health care provider prescribed.  You have unexplained: ? Heavy sweating. ? Heartburn or indigestion. ? Nausea or vomiting. ? Shortness of breath. ? Difficulty breathing. ? Fatigue. ? Nervousness or anxiety. ? Weakness. ? Diarrhea. ? Dark stools or blood in your stool.  You have sudden light-headedness or dizziness.  Your blood pressure is higher  than 180/120.  You faint.  You have thoughts about hurting yourself. These symptoms may represent a serious problem that is an emergency. Do not wait to see if the symptoms will go away. Get medical help right away. Call your local emergency services (911 in the [place] Do not drive yourself to the hospital. If you ever feel like you may hurt yourself or others, or have thoughts about taking your own life, get help right away. You can go to your nearest emergency department or call:  Emergency services (911 in the [place]  A suicide crisis helpline, such as the [facility] at 385-011-2334. This is open 24 hours a day. Summary  Acute coronary syndrome (ACS) is when there is not enough blood and oxygen being supplied to the heart. ACS can result in chest pain or a heart attack.  Acute coronary syndrome is a medical emergency. If you have any symptoms of this condition, get help right away.  Treatment includes medicines and procedures to open the blocked arteries and restore blood flow. This information is  not intended to replace advice given to you by your health care provider. Make sure you discuss any questions you have with your health care provider. Document Released: 02/08/2005 Document Revised: 10/19/2016 Document Reviewed: 10/19/2016 Elsevier Interactive Patient Education  2019 [employer].

## 2018-04-19 ENCOUNTER — Other Ambulatory Visit (HOSPITAL_COMMUNITY): Payer: Self-pay | Admitting: Cardiology

## 2018-04-19 DIAGNOSIS — I739 Peripheral vascular disease, unspecified: Secondary | ICD-10-CM

## 2018-04-25 ENCOUNTER — Ambulatory Visit (HOSPITAL_COMMUNITY)
Admission: RE | Admit: 2018-04-25 | Discharge: 2018-04-25 | Disposition: A | Discharge status: Home / Self Care | Payer: Medicaid Other | Source: Ambulatory Visit | Attending: Cardiology | Admitting: Cardiology

## 2018-04-25 DIAGNOSIS — I739 Peripheral vascular disease, unspecified: Secondary | ICD-10-CM

## 2018-05-09 ENCOUNTER — Telehealth: Payer: Self-pay | Admitting: Internal Medicine

## 2018-05-09 NOTE — Telephone Encounter (Signed)
Please call patient Lipid panel schedued for tomorrow With covid 19 pandemic working to reschedule nonemergent labs Would reschedule for 6-8 weeks

## 2018-05-10 ENCOUNTER — Other Ambulatory Visit: Payer: Medicaid Other | Admitting: *Deleted

## 2018-05-10 ENCOUNTER — Other Ambulatory Visit: Payer: Self-pay

## 2018-05-10 DIAGNOSIS — E785 Hyperlipidemia, unspecified: Secondary | ICD-10-CM | POA: Diagnosis not present

## 2018-05-10 LAB — HEPATIC FUNCTION PANEL
ALT: 37 IU/L (ref 0–44)
AST: 23 IU/L (ref 0–40)
Albumin: 3.9 g/dL (ref 3.8–4.9)
Alkaline Phosphatase: 86 IU/L (ref 39–117)
Bilirubin Total: 0.5 mg/dL (ref 0.0–1.2)
Bilirubin, Direct: 0.13 mg/dL (ref 0.00–0.40)
Total Protein: 6.2 g/dL (ref 6.0–8.5)

## 2018-05-10 LAB — LIPID PANEL
Chol/HDL Ratio: 2.9 ratio (ref 0.0–5.0)
Cholesterol, Total: 97 mg/dL — ABNORMAL LOW (ref 100–199)
HDL: 34 mg/dL — ABNORMAL LOW (ref >39)
LDL Calculated: 52 mg/dL (ref 0–99)
Triglycerides: 56 mg/dL (ref 0–149)
VLDL Cholesterol Cal: 11 mg/dL (ref 5–40)

## 2018-05-30 ENCOUNTER — Other Ambulatory Visit: Payer: Self-pay

## 2018-05-30 ENCOUNTER — Encounter (HOSPITAL_COMMUNITY): Payer: Self-pay

## 2018-05-30 ENCOUNTER — Inpatient Hospital Stay (HOSPITAL_COMMUNITY)
Admission: EM | Admit: 2018-05-30 | Discharge: 2018-06-01 | DRG: 305 | Disposition: A | Discharge status: Home / Self Care | Payer: Medicaid Other | Attending: Cardiovascular Disease | Admitting: Cardiovascular Disease

## 2018-05-30 ENCOUNTER — Emergency Department (HOSPITAL_COMMUNITY): Payer: Medicaid Other

## 2018-05-30 DIAGNOSIS — Z833 Family history of diabetes mellitus: Secondary | ICD-10-CM

## 2018-05-30 DIAGNOSIS — E7800 Pure hypercholesterolemia, unspecified: Secondary | ICD-10-CM | POA: Diagnosis present

## 2018-05-30 DIAGNOSIS — G4733 Obstructive sleep apnea (adult) (pediatric): Secondary | ICD-10-CM | POA: Diagnosis not present

## 2018-05-30 DIAGNOSIS — I200 Unstable angina: Secondary | ICD-10-CM

## 2018-05-30 DIAGNOSIS — F1290 Cannabis use, unspecified, uncomplicated: Secondary | ICD-10-CM | POA: Diagnosis present

## 2018-05-30 DIAGNOSIS — I259 Chronic ischemic heart disease, unspecified: Secondary | ICD-10-CM

## 2018-05-30 DIAGNOSIS — Z6841 Body Mass Index (BMI) 40.0 and over, adult: Secondary | ICD-10-CM

## 2018-05-30 DIAGNOSIS — E785 Hyperlipidemia, unspecified: Secondary | ICD-10-CM | POA: Diagnosis not present

## 2018-05-30 DIAGNOSIS — Z86711 Personal history of pulmonary embolism: Secondary | ICD-10-CM

## 2018-05-30 DIAGNOSIS — R1013 Epigastric pain: Secondary | ICD-10-CM

## 2018-05-30 DIAGNOSIS — F17210 Nicotine dependence, cigarettes, uncomplicated: Secondary | ICD-10-CM | POA: Diagnosis present

## 2018-05-30 DIAGNOSIS — I5032 Chronic diastolic (congestive) heart failure: Secondary | ICD-10-CM | POA: Diagnosis present

## 2018-05-30 DIAGNOSIS — R079 Chest pain, unspecified: Secondary | ICD-10-CM | POA: Diagnosis present

## 2018-05-30 DIAGNOSIS — I739 Peripheral vascular disease, unspecified: Secondary | ICD-10-CM | POA: Diagnosis present

## 2018-05-30 DIAGNOSIS — I25119 Atherosclerotic heart disease of native coronary artery with unspecified angina pectoris: Secondary | ICD-10-CM | POA: Diagnosis not present

## 2018-05-30 DIAGNOSIS — F419 Anxiety disorder, unspecified: Secondary | ICD-10-CM | POA: Diagnosis present

## 2018-05-30 DIAGNOSIS — Z720 Tobacco use: Secondary | ICD-10-CM

## 2018-05-30 DIAGNOSIS — Z7982 Long term (current) use of aspirin: Secondary | ICD-10-CM

## 2018-05-30 DIAGNOSIS — F1490 Cocaine use, unspecified, uncomplicated: Secondary | ICD-10-CM | POA: Diagnosis present

## 2018-05-30 DIAGNOSIS — Z9981 Dependence on supplemental oxygen: Secondary | ICD-10-CM

## 2018-05-30 DIAGNOSIS — I25110 Atherosclerotic heart disease of native coronary artery with unstable angina pectoris: Secondary | ICD-10-CM | POA: Diagnosis present

## 2018-05-30 DIAGNOSIS — I10 Essential (primary) hypertension: Secondary | ICD-10-CM | POA: Diagnosis not present

## 2018-05-30 DIAGNOSIS — M1990 Unspecified osteoarthritis, unspecified site: Secondary | ICD-10-CM | POA: Diagnosis present

## 2018-05-30 DIAGNOSIS — Z79899 Other long term (current) drug therapy: Secondary | ICD-10-CM

## 2018-05-30 DIAGNOSIS — I160 Hypertensive urgency: Principal | ICD-10-CM | POA: Diagnosis present

## 2018-05-30 DIAGNOSIS — I110 Hypertensive heart disease with heart failure: Secondary | ICD-10-CM | POA: Diagnosis present

## 2018-05-30 DIAGNOSIS — Z823 Family history of stroke: Secondary | ICD-10-CM

## 2018-05-30 LAB — CBC WITH DIFFERENTIAL/PLATELET
Abs Immature Granulocytes: 0.04 10*3/uL (ref 0.00–0.07)
Basophils Absolute: 0 10*3/uL (ref 0.0–0.1)
Basophils Relative: 0 %
Eosinophils Absolute: 0.1 10*3/uL (ref 0.0–0.5)
Eosinophils Relative: 1 %
HCT: 45.3 % (ref 39.0–52.0)
Hemoglobin: 14.4 g/dL (ref 13.0–17.0)
Immature Granulocytes: 0 %
Lymphocytes Relative: 31 %
Lymphs Abs: 3 10*3/uL (ref 0.7–4.0)
MCH: 28.6 pg (ref 26.0–34.0)
MCHC: 31.8 g/dL (ref 30.0–36.0)
MCV: 90.1 fL (ref 80.0–100.0)
Monocytes Absolute: 1.1 10*3/uL — ABNORMAL HIGH (ref 0.1–1.0)
Monocytes Relative: 11 %
Neutro Abs: 5.3 10*3/uL (ref 1.7–7.7)
Neutrophils Relative %: 57 %
Platelets: 289 10*3/uL (ref 150–400)
RBC: 5.03 MIL/uL (ref 4.22–5.81)
RDW: 15.1 % (ref 11.5–15.5)
WBC: 9.7 10*3/uL (ref 4.0–10.5)
nRBC: 0 % (ref 0.0–0.2)

## 2018-05-30 LAB — BASIC METABOLIC PANEL
Anion gap: 13 (ref 5–15)
BUN: 12 mg/dL (ref 6–20)
CO2: 25 mmol/L (ref 22–32)
Calcium: 9.8 mg/dL (ref 8.9–10.3)
Chloride: 104 mmol/L (ref 98–111)
Creatinine, Ser: 1.14 mg/dL (ref 0.61–1.24)
GFR calc Af Amer: >60 mL/min (ref >60)
GFR calc non Af Amer: >60 mL/min (ref >60)
Glucose, Bld: 140 mg/dL — ABNORMAL HIGH (ref 70–99)
Potassium: 4.1 mmol/L (ref 3.5–5.1)
Sodium: 142 mmol/L (ref 135–145)

## 2018-05-30 LAB — MRSA PCR SCREENING: MRSA by PCR: NEGATIVE

## 2018-05-30 LAB — TROPONIN I
Troponin I: <0.03 ng/mL (ref <0.03)
Troponin I: <0.03 ng/mL (ref <0.03)
Troponin I: <0.03 ng/mL (ref <0.03)

## 2018-05-30 MED ORDER — NITROGLYCERIN IN D5W 200-5 MCG/ML-% IV SOLN
0.0000 ug/min | INTRAVENOUS | Status: DC
  Administered 2018-05-30: 5 ug/min via INTRAVENOUS
  Filled 2018-05-30: qty 250

## 2018-05-30 MED ORDER — ALBUTEROL SULFATE (2.5 MG/3ML) 0.083% IN NEBU: 2.5000 mg | INHALATION_SOLUTION | RESPIRATORY_TRACT | Status: DC | PRN

## 2018-05-30 MED ORDER — NITROGLYCERIN 0.4 MG SL SUBL: 0.4000 mg | SUBLINGUAL_TABLET | SUBLINGUAL | Status: DC | PRN

## 2018-05-30 MED ORDER — PANTOPRAZOLE SODIUM 40 MG PO TBEC
40.0000 mg | DELAYED_RELEASE_TABLET | Freq: Every day | ORAL | Status: DC
  Administered 2018-05-31 – 2018-06-01 (×2): 40 mg via ORAL
  Filled 2018-05-30 (×2): qty 1

## 2018-05-30 MED ORDER — ASPIRIN EC 81 MG PO TBEC
81.0000 mg | DELAYED_RELEASE_TABLET | Freq: Every day | ORAL | Status: DC
  Administered 2018-05-31 – 2018-06-01 (×2): 81 mg via ORAL
  Filled 2018-05-30 (×2): qty 1

## 2018-05-30 MED ORDER — GABAPENTIN 300 MG PO CAPS
300.0000 mg | ORAL_CAPSULE | Freq: Three times a day (TID) | ORAL | Status: DC
  Administered 2018-05-30 – 2018-06-01 (×6): 300 mg via ORAL
  Filled 2018-05-30 (×6): qty 1

## 2018-05-30 MED ORDER — ATORVASTATIN CALCIUM 80 MG PO TABS
80.0000 mg | ORAL_TABLET | Freq: Every day | ORAL | Status: DC
  Administered 2018-05-31: 80 mg via ORAL
  Filled 2018-05-30: qty 1

## 2018-05-30 MED ORDER — ACETAMINOPHEN 325 MG PO TABS
650.0000 mg | ORAL_TABLET | ORAL | Status: DC | PRN
  Administered 2018-05-31: 650 mg via ORAL
  Filled 2018-05-30: qty 2

## 2018-05-30 MED ORDER — ONDANSETRON HCL 4 MG/2ML IJ SOLN: 4.0000 mg | Freq: Four times a day (QID) | INTRAMUSCULAR | Status: DC | PRN

## 2018-05-30 MED ORDER — ENOXAPARIN SODIUM 80 MG/0.8ML ~~LOC~~ SOLN
70.0000 mg | SUBCUTANEOUS | Status: DC
  Administered 2018-05-30 – 2018-05-31 (×2): 70 mg via SUBCUTANEOUS
  Filled 2018-05-30: qty 0.7
  Filled 2018-05-30 (×2): qty 0.8

## 2018-05-30 MED ORDER — NITROGLYCERIN 2 % TD OINT
1.0000 [in_us] | TOPICAL_OINTMENT | Freq: Once | TRANSDERMAL | Status: AC
  Administered 2018-05-30: 1 [in_us] via TOPICAL
  Filled 2018-05-30: qty 1

## 2018-05-30 MED ORDER — AMLODIPINE BESYLATE 10 MG PO TABS
10.0000 mg | ORAL_TABLET | Freq: Every day | ORAL | Status: DC
  Administered 2018-05-31 – 2018-06-01 (×2): 10 mg via ORAL
  Filled 2018-05-30 (×2): qty 1

## 2018-05-30 MED ORDER — VALACYCLOVIR HCL 500 MG PO TABS
500.0000 mg | ORAL_TABLET | Freq: Every day | ORAL | Status: DC
  Administered 2018-05-30 – 2018-05-31 (×2): 500 mg via ORAL
  Filled 2018-05-30 (×3): qty 1

## 2018-05-30 MED ORDER — ASPIRIN 81 MG PO CHEW
324.0000 mg | CHEWABLE_TABLET | Freq: Once | ORAL | Status: AC
  Administered 2018-05-30: 324 mg via ORAL
  Filled 2018-05-30: qty 4

## 2018-05-30 MED ORDER — ENALAPRIL MALEATE 20 MG PO TABS
40.0000 mg | ORAL_TABLET | Freq: Every day | ORAL | Status: DC
  Administered 2018-05-31 – 2018-06-01 (×2): 40 mg via ORAL
  Filled 2018-05-30 (×2): qty 2

## 2018-05-30 MED ORDER — CARVEDILOL 12.5 MG PO TABS
12.5000 mg | ORAL_TABLET | Freq: Two times a day (BID) | ORAL | Status: DC
  Administered 2018-05-30 – 2018-06-01 (×4): 12.5 mg via ORAL
  Filled 2018-05-30 (×4): qty 1

## 2018-05-30 MED ORDER — ONDANSETRON HCL 4 MG/2ML IJ SOLN
INTRAMUSCULAR | Status: AC
  Administered 2018-05-30: 4 mg
  Filled 2018-05-30: qty 2

## 2018-05-30 NOTE — Plan of Care (Signed)
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
## 2018-05-30 NOTE — ED Notes (Signed)
Pt had episode of nausea chest pain remains , Md notified pt  Given iv zofran pt remains on iv nitro ,

## 2018-05-30 NOTE — ED Notes (Signed)
Nitro paste removed.

## 2018-05-30 NOTE — Progress Notes (Signed)
ANTICOAGULATION CONSULT NOTE  Pharmacy Consult for Lovenox Indication: VTE prophylaxis  Labs: Recent Labs    05/30/18 0631  HGB 14.4  HCT 45.3  PLT 289  CREATININE 1.14  TROPONINI <0.03    Assessment: 84 yom presenting with CP. Pharmacy consulted to dose Lovenox for VTE prophylaxis. Patient with hx of PE but not currently on anticoagulation. CBC wnl. SCr 1.14 on admit. No active bleed issues documented. Estimated weight in the ER ~300 lbs.  Goal of Therapy:  VTE prevention Monitor platelets by anticoagulation protocol: Yes   Plan:  Start Lovenox 70mg  (~0.5mg /kg) [place] q24 for BMI>30 Monitor CBC at least q72h, s/sx bleeding  Elicia Lamp, PharmD, BCPS Clinical Pharmacist 05/30/2018 11:18 AM
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
## 2018-05-30 NOTE — ED Triage Notes (Addendum)
Pt come with c/o of CP and SOB that woke him up, radiation to L arm, denies nausea, vomited x 1, hx of PE

## 2018-05-30 NOTE — ED Provider Notes (Signed)
Signed out by Dr Dina Rich to consult cardiology for admission when labs resulted.  Labs reviewed - chem normal, trop normal. Pt currently cp free. Cardiology consulted.      Lajean Saver, MD 05/30/18 [phone]

## 2018-05-31 DIAGNOSIS — I110 Hypertensive heart disease with heart failure: Secondary | ICD-10-CM | POA: Diagnosis present

## 2018-05-31 DIAGNOSIS — G4733 Obstructive sleep apnea (adult) (pediatric): Secondary | ICD-10-CM | POA: Diagnosis not present

## 2018-05-31 DIAGNOSIS — Z833 Family history of diabetes mellitus: Secondary | ICD-10-CM | POA: Diagnosis not present

## 2018-05-31 DIAGNOSIS — Z9981 Dependence on supplemental oxygen: Secondary | ICD-10-CM | POA: Diagnosis not present

## 2018-05-31 DIAGNOSIS — I160 Hypertensive urgency: Secondary | ICD-10-CM | POA: Diagnosis present

## 2018-05-31 DIAGNOSIS — R079 Chest pain, unspecified: Secondary | ICD-10-CM | POA: Diagnosis not present

## 2018-05-31 DIAGNOSIS — Z823 Family history of stroke: Secondary | ICD-10-CM | POA: Diagnosis not present

## 2018-05-31 DIAGNOSIS — M1990 Unspecified osteoarthritis, unspecified site: Secondary | ICD-10-CM | POA: Diagnosis present

## 2018-05-31 DIAGNOSIS — F17210 Nicotine dependence, cigarettes, uncomplicated: Secondary | ICD-10-CM | POA: Diagnosis present

## 2018-05-31 DIAGNOSIS — F1290 Cannabis use, unspecified, uncomplicated: Secondary | ICD-10-CM | POA: Diagnosis present

## 2018-05-31 DIAGNOSIS — I739 Peripheral vascular disease, unspecified: Secondary | ICD-10-CM | POA: Diagnosis present

## 2018-05-31 DIAGNOSIS — I200 Unstable angina: Secondary | ICD-10-CM | POA: Diagnosis not present

## 2018-05-31 DIAGNOSIS — F1490 Cocaine use, unspecified, uncomplicated: Secondary | ICD-10-CM | POA: Diagnosis present

## 2018-05-31 DIAGNOSIS — F419 Anxiety disorder, unspecified: Secondary | ICD-10-CM | POA: Diagnosis present

## 2018-05-31 DIAGNOSIS — Z79899 Other long term (current) drug therapy: Secondary | ICD-10-CM | POA: Diagnosis not present

## 2018-05-31 DIAGNOSIS — E7800 Pure hypercholesterolemia, unspecified: Secondary | ICD-10-CM | POA: Diagnosis not present

## 2018-05-31 DIAGNOSIS — E785 Hyperlipidemia, unspecified: Secondary | ICD-10-CM | POA: Diagnosis not present

## 2018-05-31 DIAGNOSIS — I10 Essential (primary) hypertension: Secondary | ICD-10-CM | POA: Diagnosis not present

## 2018-05-31 DIAGNOSIS — R1013 Epigastric pain: Secondary | ICD-10-CM | POA: Diagnosis not present

## 2018-05-31 DIAGNOSIS — Z720 Tobacco use: Secondary | ICD-10-CM | POA: Diagnosis not present

## 2018-05-31 DIAGNOSIS — Z7982 Long term (current) use of aspirin: Secondary | ICD-10-CM | POA: Diagnosis not present

## 2018-05-31 DIAGNOSIS — Z6841 Body Mass Index (BMI) 40.0 and over, adult: Secondary | ICD-10-CM | POA: Diagnosis not present

## 2018-05-31 DIAGNOSIS — I25119 Atherosclerotic heart disease of native coronary artery with unspecified angina pectoris: Secondary | ICD-10-CM | POA: Diagnosis not present

## 2018-05-31 DIAGNOSIS — I259 Chronic ischemic heart disease, unspecified: Secondary | ICD-10-CM | POA: Diagnosis not present

## 2018-05-31 DIAGNOSIS — Z86711 Personal history of pulmonary embolism: Secondary | ICD-10-CM | POA: Diagnosis not present

## 2018-05-31 DIAGNOSIS — I25110 Atherosclerotic heart disease of native coronary artery with unstable angina pectoris: Secondary | ICD-10-CM | POA: Diagnosis present

## 2018-05-31 DIAGNOSIS — I5032 Chronic diastolic (congestive) heart failure: Secondary | ICD-10-CM | POA: Diagnosis present

## 2018-05-31 LAB — BASIC METABOLIC PANEL
Anion gap: 5 (ref 5–15)
BUN: 8 mg/dL (ref 6–20)
CO2: 28 mmol/L (ref 22–32)
Calcium: 8.8 mg/dL — ABNORMAL LOW (ref 8.9–10.3)
Chloride: 103 mmol/L (ref 98–111)
Creatinine, Ser: 0.93 mg/dL (ref 0.61–1.24)
GFR calc Af Amer: >60 mL/min (ref >60)
GFR calc non Af Amer: >60 mL/min (ref >60)
Glucose, Bld: 133 mg/dL — ABNORMAL HIGH (ref 70–99)
Potassium: 4.1 mmol/L (ref 3.5–5.1)
Sodium: 136 mmol/L (ref 135–145)

## 2018-05-31 LAB — TROPONIN I: Troponin I: <0.03 ng/mL (ref <0.03)

## 2018-05-31 MED ORDER — SENNOSIDES-DOCUSATE SODIUM 8.6-50 MG PO TABS
1.0000 | ORAL_TABLET | Freq: Two times a day (BID) | ORAL | Status: DC
  Administered 2018-05-31 – 2018-06-01 (×2): 1 via ORAL
  Filled 2018-05-31 (×2): qty 1

## 2018-05-31 MED ORDER — ISOSORBIDE MONONITRATE ER 30 MG PO TB24
30.0000 mg | ORAL_TABLET | Freq: Every day | ORAL | Status: DC
  Administered 2018-05-31 – 2018-06-01 (×2): 30 mg via ORAL
  Filled 2018-05-31 (×2): qty 1

## 2018-05-31 MED ORDER — BISACODYL 5 MG PO TBEC
5.0000 mg | DELAYED_RELEASE_TABLET | Freq: Every day | ORAL | Status: DC | PRN
  Administered 2018-05-31: 5 mg via ORAL
  Filled 2018-05-31: qty 1

## 2018-05-31 NOTE — Progress Notes (Addendum)
Progress Note  Patient Name: Russell Collier Date of Encounter: 05/31/2018  Primary Cardiologist: Fransico Him, MD   Subjective   Feeling well today. No further chest pain. Blood pressures improved.   Inpatient Medications    Scheduled Meds: . amLODipine  10 mg Oral Daily  . aspirin EC  81 mg Oral Daily  . atorvastatin  80 mg Oral q1800  . carvedilol  12.5 mg Oral BID WC  . enalapril  40 mg Oral Daily  . enoxaparin (LOVENOX) injection  70 mg Subcutaneous Q24H  . gabapentin  300 mg Oral TID  . pantoprazole  40 mg Oral Q0600  . valACYclovir  500 mg Oral Q1200   Continuous Infusions: . nitroGLYCERIN 5 mcg/min (05/30/18 0700)   PRN Meds: acetaminophen, albuterol, nitroGLYCERIN, ondansetron (ZOFRAN) IV   Vital Signs    Vitals:   05/30/18 1832 05/30/18 2300 05/31/18 0500 05/31/18 0754  BP: (!) 155/65 (!) 168/84 (!) 144/77 124/90  Pulse: 65 (!) 57 (!) 55 67  Resp:  17 15 18   Temp: 98 F (36.7 C) 98.2 F (36.8 C) 98.3 F (36.8 C) 98.3 F (36.8 C)  TempSrc: Oral Oral Oral Tympanic  SpO2: 97% 97% 95% 96%  Weight:      Height:        Intake/Output Summary (Last 24 hours) at 05/31/2018 0801 Last data filed at 05/30/2018 1200 Gross per 24 hour  Intake 7.5 ml  Output -  Net 7.5 ml   Last 3 Weights 05/30/2018 03/30/2018 02/07/2018  Weight (lbs) 306 lb 10.6 oz 306 lb 12.8 oz 297 lb 6.4 oz  Weight (kg) 139.1 kg 139.164 kg 134.9 kg      Telemetry    Sinus in the 60 -70s- Personally Reviewed  ECG    SR with nonspecific TW changes- Personally Reviewed  Physical Exam   GEN: No acute distress.  Morbidly obese Neck: No JVD Cardiac: RRR, 1/6 systolic murmur  Respiratory: occasional rhonchi GI: Obese; Soft, nontender, non-distended  MS: No edema; No deformity. Neuro:  Nonfocal  Psych: Normal affect   Labs    Chemistry Recent Labs  Lab 05/30/18 0631 05/31/18 0310  NA 142 136  K 4.1 4.1  CL 104 103  CO2 25 28  GLUCOSE 140* 133*  BUN 12 8  CREATININE 1.14 0.93   CALCIUM 9.8 8.8*  GFRNONAA >60 >60  GFRAA >60 >60  ANIONGAP 13 5     Hematology Recent Labs  Lab 05/30/18 0631  WBC 9.7  RBC 5.03  HGB 14.4  HCT 45.3  MCV 90.1  MCH 28.6  MCHC 31.8  RDW 15.1  PLT 289    Cardiac Enzymes Recent Labs  Lab 05/30/18 0631 05/30/18 1130 05/30/18 1702 05/30/18 2245  TROPONINI <0.03 <0.03 <0.03 <0.03   No results for input(s): TROPIPOC in the last 168 hours.   BNPNo results for input(s): BNP, PROBNP in the last 168 hours.   DDimer No results for input(s): DDIMER in the last 168 hours.   Radiology    Dg Chest Portable 1 View  Result Date: 05/30/2018 CLINICAL DATA:  Chest pain EXAM: PORTABLE CHEST 1 VIEW COMPARISON:  02/02/2018 chest CT FINDINGS: Borderline heart size, stable. There is no edema, consolidation, effusion, or pneumothorax. IMPRESSION: No evidence of acute disease. Electronically Signed   By: Monte Fantasia M.D.   On: 05/30/2018 06:53    Cardiac Studies   N/a  Patient Profile     58 y.o. male with PMH of mild CAD, HL,  HTN, OSA on Cpap, and PE who presented with chest pain and significantly elevated blood pressure.   Assessment & Plan    1. Chest Pain: presented with chest pain and shortness of breath in the setting of significantly elevated blood pressures. He has ruled out. EKG this morning without ischemia. No further chest pain overnight.  -- wean and DC nitro gtt this morning. -- Continue aspirin, high-intensity statin, and beta-blocker.  2. Chronic Diastolic CHF -- Most recent Echo from 01/2018 showed LVEF of 55-60% with grade 2 diastolic dysfunction but no significant wall motion abnormalities.  -- Continue home beta-blocker and ACEi. Stable volume status   3. Hypertensive Urgency -- Initial BP 191/114 on arrival to the ED. Much improved today. Reports having issues with having to double up on his blood pressure medications and running out at times PTA. Discussed the need to obtain a BP cuff and monitor as  outpatient.  -- continue Amlodipine 10mg  daily, Enalapril 40mg  daily, and Coreg 6.25mg  twice daily at home.   4. Hyperlipidemia: - Recent lipid panel from 05/10/2018: Cholesterol 97, Triglycerides 56, HDL 34, LDL 52.  - LDL at goal of <70 given CAD. - Continue Lipitor 80mg  daily.   5. PAD - At last office visit, patient complained of claudication.  - Lower extremity arterial dopplers with ABI were suggestive of some arterial occlusive disease. - Patient has been referred to Dr. Gwenlyn Found and has an appointment with him scheduled as an outpatient  6. Obstructive Sleep Apnea - Patient has obstructive sleep apnea but does not use a CPAP machine because he feels claustrophobic. Will need to readdress as an outpatient for better compliance.  7. Polysubstance Abuse - Patient has a 50 year smoking history. Previously smoked 2 packs per day but is now down to 1/2 pack per day. - Patient reported occasional marijuana and cocaine use. Recent marijuana use but denies cocaine. - Complete cessation is advised.  For questions or updates, please contact [place] Please consult [email or web address] for contact info under   Signed, Reino Bellis, NP  05/31/2018, 8:01 AM       Patient seen and examined. Agree with assessment and plan.  Patient feels improved today.  Chest pain has resolved.  Troponins are negative.  Previously, he had noticed chest discomfort while cutting his grass.  Blood pressure has improved and now ranges 128-148.  Now off IV nitroglycerin.  Initiate Imdur 30 mg.  Now on pantoprazole.  No further heartburn-like sensations.  Again discussed the importance of smoking cessation.  I also discussed the adverse consequences of untreated sleep apnea with reference to his cardiac health in particular its effect on hypertension, potential rhythm abnormalities, glucose intolerance, GERD, as well as potential nocturnal hypoxemia mediated ischemia.  I also  discussed improvements in mask  technology since he had discontinued CPAP use.  Ambulate today with plans for probable discharge tomorrow.   Troy Sine, MD, [facility] 05/31/2018 2:55 PM

## 2018-05-31 NOTE — Plan of Care (Signed)

## 2018-06-01 ENCOUNTER — Telehealth: Payer: Self-pay | Admitting: *Deleted

## 2018-06-01 ENCOUNTER — Telehealth: Payer: Self-pay | Admitting: Cardiology

## 2018-06-01 ENCOUNTER — Telehealth: Payer: Self-pay

## 2018-06-01 DIAGNOSIS — E7800 Pure hypercholesterolemia, unspecified: Secondary | ICD-10-CM

## 2018-06-01 DIAGNOSIS — G4733 Obstructive sleep apnea (adult) (pediatric): Secondary | ICD-10-CM

## 2018-06-01 DIAGNOSIS — R1013 Epigastric pain: Secondary | ICD-10-CM

## 2018-06-01 DIAGNOSIS — Z9989 Dependence on other enabling machines and devices: Secondary | ICD-10-CM

## 2018-06-01 MED ORDER — PANTOPRAZOLE SODIUM 40 MG PO TBEC: 40.0000 mg | DELAYED_RELEASE_TABLET | Freq: Every day | ORAL | 2 refills | Status: DC

## 2018-06-01 MED ORDER — ATORVASTATIN CALCIUM 80 MG PO TABS: 80.0000 mg | ORAL_TABLET | Freq: Every day | ORAL | 1 refills | Status: DC

## 2018-06-01 MED ORDER — CARVEDILOL 12.5 MG PO TABS: 12.5000 mg | ORAL_TABLET | Freq: Two times a day (BID) | ORAL | 3 refills | Status: DC

## 2018-06-01 MED ORDER — ISOSORBIDE MONONITRATE ER 30 MG PO TB24: 30.0000 mg | ORAL_TABLET | Freq: Every day | ORAL | 2 refills | Status: DC

## 2018-06-01 MED ORDER — ENALAPRIL MALEATE 20 MG PO TABS: 40.0000 mg | ORAL_TABLET | Freq: Every day | ORAL | 3 refills | Status: DC

## 2018-06-01 MED ORDER — AMLODIPINE BESYLATE 10 MG PO TABS: 10.0000 mg | ORAL_TABLET | Freq: Every day | ORAL | 3 refills | Status: DC

## 2018-06-01 MED FILL — ISOSORBIDE MN ER 30 MG TAB: 30 | 30 days supply | Qty: 30 | Fill #0 | Status: TO

## 2018-06-01 MED FILL — ENALAPRIL MALEATE 20 MG TAB: 20 | 30 days supply | Qty: 60 | Fill #0 | Status: TO

## 2018-06-01 MED FILL — PANTOPRAZOLE SOD DR 40 MG T: 40 | 30 days supply | Qty: 30 | Fill #0 | Status: TO

## 2018-06-01 MED FILL — CARVEDILOL 12.5 MG TABLET: 12.5 | 30 days supply | Qty: 60 | Fill #0 | Status: TO

## 2018-06-01 MED FILL — ATORVASTATIN CALCIUM 80 MG: 80 | 90 days supply | Qty: 90 | Fill #0 | Status: TO

## 2018-06-01 NOTE — Telephone Encounter (Signed)
   TELEPHONE CALL NOTE  This patient has been deemed a candidate for follow-up tele-health visit to limit community exposure during the Covid-19 pandemic. I spoke with the patient via phone to discuss instructions. This has been outlined on the patient's AVS (dotphrase: [email or web address]). The patient was advised to review the section on consent for treatment as well. The patient will receive a phone call 2-3 days prior to their E-Visit at which time consent will be verbally confirmed. A Virtual Office Visit appointment type has been scheduled for tele-health on 4/14 with Ermalinda Barrios, with "VIDEO" or "TELEPHONE" in the appointment notes - patient prefers VIDEO type.  I have either confirmed the patient is active in MyChart or offered to send sign-up link to phone/email via Mychart icon beside patient's photo.  Reino Bellis, NP 06/01/2018 10:10 AM

## 2018-06-01 NOTE — Telephone Encounter (Signed)
Pt refused evisit would rather have OV in August. Please call and reschedule.

## 2018-06-01 NOTE — Discharge Summary (Addendum)
Discharge Summary    Patient ID: Russell Collier,  MRN: [id number], DOB/AGE: 08-20-1960 58 y.o.  Admit date: 05/30/2018 Discharge date: 06/01/2018  Primary [facility] Provider: [facility] Primary Cardiologist: Dr. Radford Pax  Discharge Diagnoses    Principal Problem:   Chest pain Active Problems:   Essential hypertension   Tobacco abuse   Hyperlipidemia   Dyspepsia   Morbid obesity ([place])   OSA (obstructive sleep apnea)   Allergies No Known Allergies  Diagnostic Studies/Procedures    N/a  _____________   History of Present Illness     Russell Collier is a 58 year old male with PMH of mild CAD, HTN, HL, OSA not on Cpap and remote PE  who is followed by Dr. Radford Pax. Patient was admitted in 01/2018 after presenting with several months of progressive orthopnea, PND, and lower extremity edema and was found to have acute diastolic CHF. Echocardiogram showed LVEF of 55 to 60% with grade 2 diastolic dysfunction but no significant wall motion abnormalities. He was diuresed with IV Lasix with improvement of breathing status. Initial BP in the ED during that admission was 183/126. He was treated with IV Nitroglycerin and then transitioned to multiple PO medications. Hospital course was complicated by chest pain with slightly elevated troponin. He underwent cardiac catheterization which showed mild non-obstructive CAD. Medical therapy and aggressive primary prevention were recommended. Patient was discharged on Asprin 81mg  daily, Lipitor 80mg  daily, Amlodipine 10mg  daily, Coreg 12.5mg  twice daily, and Enalapril 40mg  daily. Patient last seen by Pecolia Ades, NP, on 03/30/2018 for follow-up at which time he reported occasional sharp chest pain lasting for a few seconds but no significant chest pain like what he had in the hospital. He still reported occasional feelings of breathlessness which contributed to anxiety. He also reported leg pain with walking. He was also confused about his medications and  had not been taken them as prescribed. Lower extremity arterial dopplers with ABI's were ordered given claudication and were suggestive of some arterial occlusive disease. Patient was referred to Dr. Gwenlyn Found and upcoming visit scheduled as an outpatient.  Patient presented to the [facility] ED on 05/30/18 for evaluation of chest pain. Patient was a difficult historian. He reported waking up with substernal chest pain and shortness of breath around 4am that morning. Patient described the chest pain as "heart burn/cramps/gas" and stated this was similar to the pain that he had previously. Patient reported some "numbness/tingling" in his left arm and leg while in the ED. Patient also reported having trouble catching his breath which he had before and felt it was anxiety related. Difficult to assess which came first shortness of breath or chest pain. Patient reported intermittent chest pain for a while now but had difficulty really explaining it. He also reported he had chest pain while working in his yard the 2 days prior to admission. However, he really seemed more concerned about the shortness of breath which he has had for a while. Patient vomited once after waking up and stated he could hear his heart beating in his chest. He also noted some lightheadedness/dizziness while walking into the ED but denied any syncopal episodes. He reported stable orthopnea, PND, and lower extremity edema at the end of the day. He denied any recent fevers or chills or body aches (but does report a lot of muscle cramps). Patient felt he may have been coming down with a cold and reported some nasal congestion and cough. He has had no known exposure to  COVID-19. Patient stated he had been taking all of his medications.   In the ED,initial BP 191/114 but vitals otherwise stable. EKG showed normal sinus rhythm with repolarization changes secondary to LVH but no acute ischemic changes. Initial troponin negative. Chest x-ray shows no  acute findings. CBC unremarkable. Na 142, K 4.1, Glucose 140, SCr 1.14. Patient was initially placed on Nitro patch with some improvement in his pain. Chest pain and shortness of breath completely resolved after being transitioned to IV Nitroglycerin.   Patient has a 50 year smoking history and previously smoked 2 packs per day but is now down to 1/2 pack per day. Patient reported occasional alcohol, cocaine, and marijuana use. Patient last used marijuana the night prior to admission. He stated it has been a while since he has used cocaine. Patient has no known family history of heart disease. Given symptoms he was admitted for further work up.  Hospital Course     1. Chest Pain: presented with chest pain and shortness of breath in the setting of significantly elevated blood pressures. He ruled out and resolution of chest pain with improvement in his blood pressure. Follow up EKG without ischemia. Nitro was weaned and Dc'ed, placed on Imdur 30mg  daily.  -- Continue aspirin, high-intensity statin, beta-blocker, amlodipine and Imdur.  2. Chronic Diastolic CHF: Most recent Echo from 01/2018 showed LVEF of 55-60% with grade 2 diastolic dysfunction but no significant wall motion abnormalities.  -- Continue home beta-blocker and ACEi. Stable volume status  3. Hypertensive Urgency: -- Initial BP 191/114 on arrival to the ED. Much improved during admission. Reports having issues with having to double up on his blood pressure medications and running out at times PTA. Discussed the need to obtain a BP cuff and monitor as outpatient.  -- continueAmlodipine 10mg  daily, Enalapril 40mg  daily, and Coreg 12.5mg  BID  4. Hyperlipidemia: - Recent lipid panel from 05/10/2018: Cholesterol 97, Triglycerides 56, HDL 34, LDL 52.  - LDL at goal of <70 given CAD. - Continue Lipitor 80mg  daily.   5. PAD: At last office visit, patient complained of claudication.  - Lower extremity arterial dopplers with ABI were  suggestive of some arterial occlusive disease. - Patient has been referred to Dr. Gwenlyn Found and has an appointment with him scheduled as an outpatient  6. Obstructive Sleep Apnea: - Patient has obstructive sleep apnea but does not use a CPAP machine because he feels claustrophobic. Will need to readdress as an outpatient for better compliance.  7. Polysubstance Abuse: - Patient has a 50 year smoking history. Previously smoked 2 packs per day but is now down to 1/2 pack per day. - Patient reported occasional marijuana and cocaine use. Recent marijuana use but denies cocaine. - Complete cessation is advised.  Russell Collier was seen by Dr. Claiborne Billings and determined stable for discharge home. Follow up in the office has been arranged. Medications are listed below.   _____________  Discharge Vitals Blood pressure (!) 146/84, pulse (!) 50, temperature 97.7 F (36.5 C), temperature source Oral, resp. rate 14, height 6' (1.829 m), weight (!) 141.2 kg, SpO2 100 %.  Filed Weights   05/30/18 1234 06/01/18 0431  Weight: (!) 139.1 kg (!) 141.2 kg    Labs & Radiologic Studies    CBC Recent Labs    05/30/18 0631  WBC 9.7  NEUTROABS 5.3  HGB 14.4  HCT 45.3  MCV 90.1  PLT 272   Basic Metabolic Panel Recent Labs    05/30/18 0631 05/31/18 0310  NA 142 136  K 4.1 4.1  CL 104 103  CO2 25 28  GLUCOSE 140* 133*  BUN 12 8  CREATININE 1.14 0.93  CALCIUM 9.8 8.8*   Liver Function Tests No results for input(s): AST, ALT, ALKPHOS, BILITOT, PROT, ALBUMIN in the last 72 hours. No results for input(s): LIPASE, AMYLASE in the last 72 hours. Cardiac Enzymes Recent Labs    05/30/18 1130 05/30/18 1702 05/30/18 2245  TROPONINI <0.03 <0.03 <0.03   BNP Invalid input(s): POCBNP D-Dimer No results for input(s): DDIMER in the last 72 hours. Hemoglobin A1C No results for input(s): HGBA1C in the last 72 hours. Fasting Lipid Panel No results for input(s): CHOL, HDL, LDLCALC, TRIG, CHOLHDL,  LDLDIRECT in the last 72 hours. Thyroid Function Tests No results for input(s): TSH, T4TOTAL, T3FREE, THYROIDAB in the last 72 hours.  Invalid input(s): FREET3 _____________  Dg Chest Portable 1 View  Result Date: 05/30/2018 CLINICAL DATA:  Chest pain EXAM: PORTABLE CHEST 1 VIEW COMPARISON:  02/02/2018 chest CT FINDINGS: Borderline heart size, stable. There is no edema, consolidation, effusion, or pneumothorax. IMPRESSION: No evidence of acute disease. Electronically Signed   By: Monte Fantasia M.D.   On: 05/30/2018 06:53   Disposition   Pt is being discharged home today in good condition.  Follow-up Plans & Appointments    Follow-up Information    Imogene Burn, PA-C Follow up on 06/06/2018.   Specialty:  Cardiology Why:  at 1pm for your follow up appt. This will be a tele-health evisit appt. Please ensure you have your MyChart account active for this appt.  Contact information: [address] [place] [address] [phone]          Discharge Instructions    Diet - low sodium heart healthy   Complete by:  As directed    Increase activity slowly   Complete by:  As directed        Discharge Medications     Medication List    TAKE these medications   albuterol 108 (90 Base) MCG/ACT inhaler Commonly known as:  PROVENTIL HFA;VENTOLIN HFA Inhale 2 puffs into the lungs every 4 (four) hours as needed for wheezing or shortness of breath.   amLODipine 10 MG tablet Commonly known as:  NORVASC Take 1 tablet (10 mg total) by mouth daily.   aspirin 81 MG EC tablet Take 1 tablet (81 mg total) by mouth daily.   atorvastatin 80 MG tablet Commonly known as:  LIPITOR Take 1 tablet (80 mg total) by mouth daily at 6 PM.   carvedilol 12.5 MG tablet Commonly known as:  COREG Take 1 tablet (12.5 mg total) by mouth 2 (two) times daily with a meal. What changed:  medication strength   enalapril 20 MG tablet Commonly known as:  VASOTEC Take 2 tablets (40  mg total) by mouth daily.   gabapentin 300 MG capsule Commonly known as:  NEURONTIN Take 300 mg by mouth 3 (three) times daily.   ibuprofen 200 MG tablet Commonly known as:  ADVIL,MOTRIN Take 800 mg by mouth as needed for moderate pain (knee pain).   isosorbide mononitrate 30 MG 24 hr tablet Commonly known as:  IMDUR Take 1 tablet (30 mg total) by mouth daily.   multivitamin capsule Take 1 capsule by mouth daily.   pantoprazole 40 MG tablet Commonly known as:  PROTONIX Take 1 tablet (40 mg total) by mouth daily at 6 (six) AM. Start taking on:  June 02, 2018  valACYclovir 500 MG tablet Commonly known as:  VALTREX Take 500 mg by mouth daily at 12 noon.        Acute coronary syndrome (MI, NSTEMI, STEMI, etc) this admission?: No.     Outstanding Labs/Studies   N/a   Duration of Discharge Encounter   Greater than 30 minutes including physician time.  Signed, Giovani Neumeister NP-C 06/01/2018, 10:03 AM    Patient seen and examined. Agree with assessment and plan. Feels well today. BP stable. Sinus rhythm 58. No recurrent chest tightness or dyspepsia now on increased med regimen including nitrate and PPI.  Aim for BP < 130/80 with ideal < 120/80. Recommend re-institution of CPAP. Smoking cessation. OK for dc today.   Troy Sine, MD, [facility] 06/01/2018 10:03 AM

## 2018-06-01 NOTE — Telephone Encounter (Signed)
Patient call

## 2018-06-01 NOTE — Discharge Instructions (Signed)
YOUR CARDIOLOGY TEAM HAS ARRANGED FOR AN E-VISIT FOR YOUR APPOINTMENT - PLEASE REVIEW IMPORTANT INFORMATION BELOW SEVERAL DAYS PRIOR TO YOUR APPOINTMENT  Due to the recent COVID-19 pandemic, we are transitioning in-person office visits to tele-medicine visits in an effort to decrease unnecessary exposure to our patients and staff. Medicare and most insurances are covering these visits without a copay needed. We also encourage you to sign up for MyChart if you have not already done so. You will need a smartphone if possible. For patients that do not have this, we can still complete the visit using a regular telephone but do prefer a smartphone to enable video when possible. You may have a close family member that lives with you that can help. If possible, we also ask that you have a blood pressure cuff and scale at home to measure your blood pressure, heart rate and weight prior to your scheduled appointment. Patients with clinical needs that need an in-person evaluation and testing will still be able to come to the office if absolutely necessary. If you have any questions, feel free to call our office.    IF YOU HAVE A SMARTPHONE, PLEASE DOWNLOAD THE WEBEX APP TO YOUR SMARTPHONE  - If Apple, go to App Store and type in WebEx in the search bar. Download [employer] Webex Meetings, the blue/green circle. The app is free but as with any other app download, your phone may require you to verify saved payment information or Apple password. You do NOT have to create a WebEx account.  - If Android, go to Google Play Store and type in WebEx in the search bar. Download [employer] Webex Meetings, the blue/green circle. The app is free but as with any other app download, your phone may require you to verify saved payment information or Android password. You do NOT have to create a WebEx account.  It is very helpful to have this downloaded before your visit.    2-3 DAYS BEFORE YOUR APPOINTMENT  You will receive a  telephone call from one of our [facility] team members - your caller ID may say "Unknown caller." If this is a video visit, we will confirm that you have been able to download the WebEx app. We will remind you check your blood pressure, heart rate and weight prior to your scheduled appointment. If you have an Apple Watch or Kardia, please upload any pertinent ECG strips the day before or morning of your appointment to MyChart. Our staff will also make sure you have reviewed the consent and agree to move forward with your scheduled tele-health visit.     THE DAY OF YOUR APPOINTMENT  Approximately 15 minutes prior to your scheduled appointment, you will receive a telephone call from one of [facility] team - your caller ID may say "Unknown caller."  Our staff will confirm medications, vital signs for the day and any symptoms you may be experiencing. Please have this information available prior to the time of visit start. It may also be helpful for you to have a pad of paper and pen handy for any instructions given during your visit. They will also walk you through joining the WebEx smartphone meeting if this is a video visit.    CONSENT FOR TELE-HEALTH VISIT - PLEASE REVIEW  I hereby voluntarily request, consent and authorize [facility] and its employed or contracted physicians, physician assistants, nurse practitioners or other licensed health care professionals (the Practitioner), to provide me with telemedicine health care services (the "Services") as   deemed necessary by the treating Practitioner. I acknowledge and consent to receive the Services by the Practitioner via telemedicine. I understand that the telemedicine visit will involve communicating with the Practitioner through live audiovisual communication technology and the disclosure of certain medical information by electronic transmission. I acknowledge that I have been given the opportunity to request an in-person assessment or other available  alternative prior to the telemedicine visit and am voluntarily participating in the telemedicine visit.  I understand that I have the right to withhold or withdraw my consent to the use of telemedicine in the course of my care at any time, without affecting my right to future care or treatment, and that the Practitioner or I may terminate the telemedicine visit at any time. I understand that I have the right to inspect all information obtained and/or recorded in the course of the telemedicine visit and may receive copies of available information for a reasonable fee.  I understand that some of the potential risks of receiving the Services via telemedicine include:  . Delay or interruption in medical evaluation due to technological equipment failure or disruption; . Information transmitted may not be sufficient (e.g. poor resolution of images) to allow for appropriate medical decision making by the Practitioner; and/or  . In rare instances, security protocols could fail, causing a breach of personal health information.  Furthermore, I acknowledge that it is my responsibility to provide information about my medical history, conditions and care that is complete and accurate to the best of my ability. I acknowledge that Practitioner's advice, recommendations, and/or decision may be based on factors not within their control, such as incomplete or inaccurate data provided by me or distortions of diagnostic images or specimens that may result from electronic transmissions. I understand that the practice of medicine is not an exact science and that Practitioner makes no warranties or guarantees regarding treatment outcomes. I acknowledge that I will receive a copy of this consent concurrently upon execution via email to the email address I last provided but may also request a printed copy by calling the office of [facility].    I understand that my insurance will be billed for this visit.   I have read or had  this consent read to me. . I understand the contents of this consent, which adequately explains the benefits and risks of the Services being provided via telemedicine.  . I have been provided ample opportunity to ask questions regarding this consent and the Services and have had my questions answered to my satisfaction. . I give my informed consent for the services to be provided through the use of telemedicine in my medical care  By participating in this telemedicine visit I agree to the above.  

## 2018-06-06 ENCOUNTER — Emergency Department (HOSPITAL_COMMUNITY)
Admission: EM | Admit: 2018-06-06 | Discharge: 2018-06-06 | Disposition: A | Discharge status: Home / Self Care | Payer: Medicaid Other | Attending: Emergency Medicine | Admitting: Emergency Medicine

## 2018-06-06 ENCOUNTER — Telehealth: Payer: Medicaid Other | Admitting: Physician Assistant

## 2018-06-06 ENCOUNTER — Emergency Department (HOSPITAL_COMMUNITY): Payer: Medicaid Other

## 2018-06-06 ENCOUNTER — Other Ambulatory Visit: Payer: Self-pay

## 2018-06-06 ENCOUNTER — Ambulatory Visit: Payer: Medicaid Other | Admitting: Cardiovascular Disease

## 2018-06-06 DIAGNOSIS — F17210 Nicotine dependence, cigarettes, uncomplicated: Secondary | ICD-10-CM | POA: Diagnosis not present

## 2018-06-06 DIAGNOSIS — I2510 Atherosclerotic heart disease of native coronary artery without angina pectoris: Secondary | ICD-10-CM | POA: Diagnosis not present

## 2018-06-06 DIAGNOSIS — Z79899 Other long term (current) drug therapy: Secondary | ICD-10-CM | POA: Diagnosis not present

## 2018-06-06 DIAGNOSIS — Z7982 Long term (current) use of aspirin: Secondary | ICD-10-CM | POA: Diagnosis not present

## 2018-06-06 DIAGNOSIS — R002 Palpitations: Secondary | ICD-10-CM | POA: Diagnosis not present

## 2018-06-06 DIAGNOSIS — I10 Essential (primary) hypertension: Secondary | ICD-10-CM | POA: Diagnosis not present

## 2018-06-06 DIAGNOSIS — R069 Unspecified abnormalities of breathing: Secondary | ICD-10-CM | POA: Diagnosis not present

## 2018-06-06 DIAGNOSIS — R079 Chest pain, unspecified: Secondary | ICD-10-CM | POA: Diagnosis not present

## 2018-06-06 DIAGNOSIS — R0602 Shortness of breath: Secondary | ICD-10-CM | POA: Insufficient documentation

## 2018-06-06 DIAGNOSIS — R42 Dizziness and giddiness: Secondary | ICD-10-CM | POA: Diagnosis not present

## 2018-06-06 LAB — CBC
HCT: 40.3 % (ref 39.0–52.0)
Hemoglobin: 13.1 g/dL (ref 13.0–17.0)
MCH: 29.5 pg (ref 26.0–34.0)
MCHC: 32.5 g/dL (ref 30.0–36.0)
MCV: 90.8 fL (ref 80.0–100.0)
Platelets: 227 10*3/uL (ref 150–400)
RBC: 4.44 MIL/uL (ref 4.22–5.81)
RDW: 15.2 % (ref 11.5–15.5)
WBC: 9.7 10*3/uL (ref 4.0–10.5)
nRBC: 0 % (ref 0.0–0.2)

## 2018-06-06 LAB — COMPREHENSIVE METABOLIC PANEL
ALT: 39 U/L (ref 0–44)
AST: 28 U/L (ref 15–41)
Albumin: 3.8 g/dL (ref 3.5–5.0)
Alkaline Phosphatase: 85 U/L (ref 38–126)
Anion gap: 9 (ref 5–15)
BUN: 16 mg/dL (ref 6–20)
CO2: 25 mmol/L (ref 22–32)
Calcium: 8.8 mg/dL — ABNORMAL LOW (ref 8.9–10.3)
Chloride: 105 mmol/L (ref 98–111)
Creatinine, Ser: 0.87 mg/dL (ref 0.61–1.24)
GFR calc Af Amer: >60 mL/min (ref >60)
GFR calc non Af Amer: >60 mL/min (ref >60)
Glucose, Bld: 163 mg/dL — ABNORMAL HIGH (ref 70–99)
Potassium: 3.9 mmol/L (ref 3.5–5.1)
Sodium: 139 mmol/L (ref 135–145)
Total Bilirubin: 0.5 mg/dL (ref 0.3–1.2)
Total Protein: 7 g/dL (ref 6.5–8.1)

## 2018-06-06 LAB — TROPONIN I: Troponin I: <0.03 ng/mL (ref <0.03)

## 2018-06-06 LAB — BRAIN NATRIURETIC PEPTIDE: B Natriuretic Peptide: 33.3 pg/mL (ref 0.0–100.0)

## 2018-06-06 MED ORDER — AEROCHAMBER PLUS FLO-VU LARGE MISC
Status: AC
  Filled 2018-06-06: qty 1

## 2018-06-06 MED ORDER — AEROCHAMBER PLUS FLO-VU MEDIUM MISC
1.0000 | Freq: Once | Status: AC
  Administered 2018-06-06: 1
  Filled 2018-06-06: qty 1

## 2018-06-06 MED ORDER — ASPIRIN EC 81 MG PO TBEC: 81.0000 mg | DELAYED_RELEASE_TABLET | Freq: Every day | ORAL | 0 refills | Status: DC

## 2018-06-06 MED ORDER — ALBUTEROL SULFATE HFA 108 (90 BASE) MCG/ACT IN AERS
2.0000 | INHALATION_SPRAY | Freq: Once | RESPIRATORY_TRACT | Status: AC
  Administered 2018-06-06: 2 via RESPIRATORY_TRACT
  Filled 2018-06-06: qty 6.7

## 2018-06-06 NOTE — ED Triage Notes (Addendum)
Pt coming by EMS with complaints of shob and chest pain. EMS noted apt to be anxious.  Crackles noted in lung bases by EMS. Placed on non-rebreather for comfort.  Gave 2 nitro with decreased chest pain after second dose. Gave ASA 324 mg. Gave 2.5mg  Haldol to help with anxiety. Recent admitted for chest pain. Hx of CHF, HTN, arrhythmia.

## 2018-06-06 NOTE — ED Notes (Signed)
Reviewed d/c instructions with pt, who verbalized understanding and had no outstanding questions. [employer] & pt labels removed and disposed of in shred bin. Pt departed in NAD.

## 2018-06-06 NOTE — ED Notes (Signed)
Patient transported to X-ray 

## 2018-06-06 NOTE — ED Notes (Signed)
ED Provider at bedside. 

## 2018-06-06 NOTE — ED Notes (Signed)
Per NT, pt ambulated in hallway without significant difficulty or need for more than standby assistance. NT notes that at one point the pt's SpO2 decr'd to 88% but rebounded to 98% on RA before returning to room. Pt displayed no significant incr in WOB.
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
## 2018-06-06 NOTE — Discharge Instructions (Signed)
Thank you for allowing me to care for you today in the Emergency Department.   Follow up with your cardiologist regarding your ER visit today as you might need some of your medications adjusted.  Use 2 puff of your albuterol inhaler with the spacer every 4 hours as needed for wheezing or shortness of breath.  Return to the Emergency Department if you develop severe shortness of breath despite using your home albuterol inhaler, with severe chest pain, if your fingers or mouth turn blue, if you develop respiratory distress, or other new concerning symptoms.

## 2018-06-06 NOTE — ED Notes (Addendum)
Noted expiratory wheezing in lower lobes.

## 2018-06-19 ENCOUNTER — Encounter (HOSPITAL_COMMUNITY): Payer: Self-pay

## 2018-06-19 ENCOUNTER — Emergency Department (HOSPITAL_COMMUNITY)
Admission: EM | Admit: 2018-06-19 | Discharge: 2018-06-20 | Disposition: A | Discharge status: Home / Self Care | Payer: Medicaid Other | Attending: Emergency Medicine | Admitting: Emergency Medicine

## 2018-06-19 ENCOUNTER — Emergency Department (HOSPITAL_COMMUNITY): Payer: Medicaid Other

## 2018-06-19 ENCOUNTER — Other Ambulatory Visit: Payer: Self-pay

## 2018-06-19 DIAGNOSIS — R42 Dizziness and giddiness: Secondary | ICD-10-CM | POA: Diagnosis not present

## 2018-06-19 DIAGNOSIS — R079 Chest pain, unspecified: Secondary | ICD-10-CM | POA: Diagnosis not present

## 2018-06-19 DIAGNOSIS — Z86711 Personal history of pulmonary embolism: Secondary | ICD-10-CM | POA: Diagnosis not present

## 2018-06-19 DIAGNOSIS — Z7982 Long term (current) use of aspirin: Secondary | ICD-10-CM | POA: Diagnosis not present

## 2018-06-19 DIAGNOSIS — I10 Essential (primary) hypertension: Secondary | ICD-10-CM | POA: Insufficient documentation

## 2018-06-19 DIAGNOSIS — R072 Precordial pain: Secondary | ICD-10-CM | POA: Diagnosis present

## 2018-06-19 DIAGNOSIS — R0902 Hypoxemia: Secondary | ICD-10-CM | POA: Diagnosis not present

## 2018-06-19 DIAGNOSIS — R0689 Other abnormalities of breathing: Secondary | ICD-10-CM | POA: Diagnosis not present

## 2018-06-19 DIAGNOSIS — F17210 Nicotine dependence, cigarettes, uncomplicated: Secondary | ICD-10-CM | POA: Diagnosis not present

## 2018-06-19 DIAGNOSIS — Z79899 Other long term (current) drug therapy: Secondary | ICD-10-CM | POA: Diagnosis not present

## 2018-06-19 DIAGNOSIS — I2510 Atherosclerotic heart disease of native coronary artery without angina pectoris: Secondary | ICD-10-CM | POA: Diagnosis not present

## 2018-06-19 LAB — BASIC METABOLIC PANEL
Anion gap: 11 (ref 5–15)
BUN: 10 mg/dL (ref 6–20)
CO2: 24 mmol/L (ref 22–32)
Calcium: 9.1 mg/dL (ref 8.9–10.3)
Chloride: 104 mmol/L (ref 98–111)
Creatinine, Ser: 0.84 mg/dL (ref 0.61–1.24)
GFR calc Af Amer: >60 mL/min (ref >60)
GFR calc non Af Amer: >60 mL/min (ref >60)
Glucose, Bld: 112 mg/dL — ABNORMAL HIGH (ref 70–99)
Potassium: 3.9 mmol/L (ref 3.5–5.1)
Sodium: 139 mmol/L (ref 135–145)

## 2018-06-19 LAB — CBC
HCT: 40.4 % (ref 39.0–52.0)
Hemoglobin: 13.3 g/dL (ref 13.0–17.0)
MCH: 29.6 pg (ref 26.0–34.0)
MCHC: 32.9 g/dL (ref 30.0–36.0)
MCV: 89.8 fL (ref 80.0–100.0)
Platelets: 242 10*3/uL (ref 150–400)
RBC: 4.5 MIL/uL (ref 4.22–5.81)
RDW: 15.1 % (ref 11.5–15.5)
WBC: 10 10*3/uL (ref 4.0–10.5)
nRBC: 0 % (ref 0.0–0.2)

## 2018-06-19 LAB — TROPONIN I: Troponin I: <0.03 ng/mL (ref <0.03)

## 2018-06-19 MED ORDER — SODIUM CHLORIDE 0.9% FLUSH: 3.0000 mL | Freq: Once | INTRAVENOUS | Status: DC

## 2018-06-19 NOTE — ED Triage Notes (Signed)
Pt arrives via [facility] for chest pain  Around 1900 and dizziness during standing/exertion. Pt denies current chest pain. Pt reports some anxiousness. Initial BP 220/120, last BP 190/120. Pt received 2 NTG and 324 ASA. Pt now pain free. Pt alert, oriented, and ambulatory.

## 2018-06-20 ENCOUNTER — Encounter (HOSPITAL_COMMUNITY): Payer: Self-pay | Admitting: Emergency Medicine

## 2018-06-20 ENCOUNTER — Other Ambulatory Visit: Payer: Self-pay

## 2018-06-20 LAB — TROPONIN I: Troponin I: <0.03 ng/mL (ref <0.03)

## 2018-06-20 MED ORDER — ALUM & MAG HYDROXIDE-SIMETH 200-200-20 MG/5ML PO SUSP
30.0000 mL | Freq: Once | ORAL | Status: AC
  Administered 2018-06-20: 01:00:00 30 mL via ORAL
  Filled 2018-06-20: qty 30

## 2018-06-20 MED ORDER — HYDROCHLOROTHIAZIDE 25 MG PO TABS: 25.0000 mg | ORAL_TABLET | Freq: Every day | ORAL | Status: DC

## 2018-06-20 NOTE — ED Notes (Addendum)
Pt unable to e-sign consent for discharge. Pt verbalizes consent for discharge. Reviewed discharge instructions with patient, allowed time for questions and provided answers.
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
## 2018-06-21 ENCOUNTER — Emergency Department (HOSPITAL_COMMUNITY): Payer: Medicaid Other

## 2018-06-21 ENCOUNTER — Encounter (HOSPITAL_COMMUNITY): Payer: Self-pay

## 2018-06-21 ENCOUNTER — Emergency Department (HOSPITAL_COMMUNITY)
Admission: EM | Admit: 2018-06-21 | Discharge: 2018-06-22 | Disposition: A | Discharge status: Home / Self Care | Payer: Medicaid Other | Attending: Emergency Medicine | Admitting: Emergency Medicine

## 2018-06-21 DIAGNOSIS — I110 Hypertensive heart disease with heart failure: Secondary | ICD-10-CM | POA: Diagnosis not present

## 2018-06-21 DIAGNOSIS — R531 Weakness: Secondary | ICD-10-CM | POA: Diagnosis not present

## 2018-06-21 DIAGNOSIS — I5032 Chronic diastolic (congestive) heart failure: Secondary | ICD-10-CM | POA: Insufficient documentation

## 2018-06-21 DIAGNOSIS — R079 Chest pain, unspecified: Secondary | ICD-10-CM | POA: Diagnosis not present

## 2018-06-21 DIAGNOSIS — I2510 Atherosclerotic heart disease of native coronary artery without angina pectoris: Secondary | ICD-10-CM | POA: Insufficient documentation

## 2018-06-21 DIAGNOSIS — I10 Essential (primary) hypertension: Secondary | ICD-10-CM

## 2018-06-21 DIAGNOSIS — R0789 Other chest pain: Secondary | ICD-10-CM | POA: Insufficient documentation

## 2018-06-21 DIAGNOSIS — Z79899 Other long term (current) drug therapy: Secondary | ICD-10-CM | POA: Diagnosis not present

## 2018-06-21 DIAGNOSIS — F17210 Nicotine dependence, cigarettes, uncomplicated: Secondary | ICD-10-CM | POA: Diagnosis not present

## 2018-06-21 LAB — BASIC METABOLIC PANEL
Anion gap: 8 (ref 5–15)
BUN: 13 mg/dL (ref 6–20)
CO2: 26 mmol/L (ref 22–32)
Calcium: 9 mg/dL (ref 8.9–10.3)
Chloride: 106 mmol/L (ref 98–111)
Creatinine, Ser: 0.88 mg/dL (ref 0.61–1.24)
GFR calc Af Amer: >60 mL/min (ref >60)
GFR calc non Af Amer: >60 mL/min (ref >60)
Glucose, Bld: 109 mg/dL — ABNORMAL HIGH (ref 70–99)
Potassium: 4.3 mmol/L (ref 3.5–5.1)
Sodium: 140 mmol/L (ref 135–145)

## 2018-06-21 LAB — CBC
HCT: 39.3 % (ref 39.0–52.0)
Hemoglobin: 13.1 g/dL (ref 13.0–17.0)
MCH: 29.9 pg (ref 26.0–34.0)
MCHC: 33.3 g/dL (ref 30.0–36.0)
MCV: 89.7 fL (ref 80.0–100.0)
Platelets: 217 10*3/uL (ref 150–400)
RBC: 4.38 MIL/uL (ref 4.22–5.81)
RDW: 15.3 % (ref 11.5–15.5)
WBC: 9.6 10*3/uL (ref 4.0–10.5)
nRBC: 0 % (ref 0.0–0.2)

## 2018-06-21 LAB — TROPONIN I: Troponin I: <0.03 ng/mL (ref <0.03)
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
## 2018-06-21 NOTE — ED Triage Notes (Signed)
Pt comes via [facility] for L sided CP that started around 5pm, no radiation, 8/10, denies other cardiac symptoms, PTA took 325 ASA and 2 nitro with relief of pain. Hypertensive.

## 2018-06-22 LAB — TROPONIN I: Troponin I: <0.03 ng/mL (ref <0.03)

## 2018-06-22 MED ORDER — HYDRALAZINE HCL 25 MG PO TABS: 25.0000 mg | ORAL_TABLET | Freq: Three times a day (TID) | ORAL | 0 refills | Status: DC

## 2018-06-22 NOTE — Discharge Instructions (Addendum)
You were seen in the ED for chest pain and elevated blood pressure. Work up in the ED again is reassuring.   Cardiology saw you today and does not think your chest pain is probably NOT coming from your heart. The cause is still unclear but it might be muscular.   Take hydralazine 25 mg every 8 hours for better blood pressure control.  Continue all your medicines. Check your blood pressure every day.   Avoid smoking, marijuana, cocaine.  Go to your primary are appointment next week for further discussion of your pain

## 2018-06-27 DIAGNOSIS — Z79899 Other long term (current) drug therapy: Secondary | ICD-10-CM | POA: Diagnosis not present

## 2018-06-27 DIAGNOSIS — E782 Mixed hyperlipidemia: Secondary | ICD-10-CM | POA: Diagnosis not present

## 2018-06-27 DIAGNOSIS — D539 Nutritional anemia, unspecified: Secondary | ICD-10-CM | POA: Diagnosis not present

## 2018-06-27 DIAGNOSIS — E1142 Type 2 diabetes mellitus with diabetic polyneuropathy: Secondary | ICD-10-CM | POA: Diagnosis not present

## 2018-07-11 ENCOUNTER — Telehealth: Payer: Self-pay | Admitting: Cardiology

## 2018-07-11 NOTE — Telephone Encounter (Signed)
Virtual Visit Pre-Appointment Phone Call  "(Name), I am calling you today to discuss your upcoming appointment. We are currently trying to limit exposure to the virus that causes COVID-19 by seeing patients at home rather than in the office."  1. "What is the BEST phone number to call the day of the visit?" - include this in appointment notes  2. Do you have or have access to (through a family member/friend) a smartphone with video capability that we can use for your visit?" a. If yes - list this number in appt notes as cell (if different from BEST phone #) and list the appointment type as a VIDEO visit in appointment notes b. If no - list the appointment type as a PHONE visit in appointment notes  3. Confirm consent - "In the setting of the current Covid19 crisis, you are scheduled for a (phone or video) visit with your provider on (date) at (time).  Just as we do with many in-office visits, in order for you to participate in this visit, we must obtain consent.  If you'd like, I can send this to your mychart (if signed up) or email for you to review.  Otherwise, I can obtain your verbal consent now.  All virtual visits are billed to your insurance company just like a normal visit would be.  By agreeing to a virtual visit, we'd like you to understand that the technology does not allow for your provider to perform an examination, and thus may limit your provider's ability to fully assess your condition. If your provider identifies any concerns that need to be evaluated in person, we will make arrangements to do so.  Finally, though the technology is pretty good, we cannot assure that it will always work on either your or our end, and in the setting of a video visit, we may have to convert it to a phone-only visit.  In either situation, we cannot ensure that we have a secure connection.  Are you willing to proceed?" STAFF: Did the patient verbally acknowledge consent to telehealth visit? Document  YES/NO here: Yes/[email or web address]/smartphone [phone] Russell Collier consent 07/11/18/vitals  4. Advise patient to be prepared - "Two hours prior to your appointment, go ahead and check your blood pressure, pulse, oxygen saturation, and your weight (if you have the equipment to check those) and write them all down. When your visit starts, your provider will ask you for this information. If you have an Apple Watch or Kardia device, please plan to have heart rate information ready on the day of your appointment. Please have a pen and paper handy nearby the day of the visit as well."  5. Give patient instructions for MyChart download to smartphone OR [email or web address] as below if video visit (depending on what platform provider is using)  6. Inform patient they will receive a phone call 15 minutes prior to their appointment time (may be from unknown caller ID) so they should be prepared to answer    TELEPHONE CALL NOTE  Russell Collier has been deemed a candidate for a follow-up tele-health visit to limit community exposure during the Covid-19 pandemic. I spoke with the patient via phone to ensure availability of phone/video source, confirm preferred email & phone number, and discuss instructions and expectations.  I reminded Russell Collier to be prepared with any vital sign and/or heart rhythm information that could potentially be obtained via home monitoring, at the time of his visit. I reminded Russell Collier to expect a phone call  prior to his visit.  Armando Gang 07/11/2018 10:50 AM   INSTRUCTIONS FOR DOWNLOADING THE MYCHART APP TO SMARTPHONE  - The patient must first make sure to have activated MyChart and know their login information - If Apple, go to [employer] and type in MyChart in the search bar and download the app. If Android, ask patient to go to [employer] and type in [place] in the search bar and download the app. The app is free but as with any other app downloads, their phone may  require them to verify saved payment information or Apple/Android password.  - The patient will need to then log into the app with their MyChart username and password, and select [facility] as their healthcare provider to link the account. When it is time for your visit, go to the MyChart app, find appointments, and click Begin Video Visit. Be sure to Select Allow for your device to access the Microphone and Camera for your visit. You will then be connected, and your provider will be with you shortly.  **If they have any issues connecting, or need assistance please contact MyChart service desk [phone])**  **If using a computer, in order to ensure the best quality for their visit they will need to use either of the following Internet Browsers: [employer], or Google Chrome**  IF USING DOXIMITY or DOXY.ME - The patient will receive a link just prior to their visit by text.     FULL LENGTH CONSENT FOR TELE-HEALTH VISIT   I hereby voluntarily request, consent and authorize [facility] and its employed or contracted physicians, physician assistants, nurse practitioners or other licensed health care professionals (the Practitioner), to provide me with telemedicine health care services (the Services") as deemed necessary by the treating Practitioner. I acknowledge and consent to receive the Services by the Practitioner via telemedicine. I understand that the telemedicine visit will involve communicating with the Practitioner through live audiovisual communication technology and the disclosure of certain medical information by electronic transmission. I acknowledge that I have been given the opportunity to request an in-person assessment or other available alternative prior to the telemedicine visit and am voluntarily participating in the telemedicine visit.  I understand that I have the right to withhold or withdraw my consent to the use of telemedicine in the course of my care at  any time, without affecting my right to future care or treatment, and that the Practitioner or I may terminate the telemedicine visit at any time. I understand that I have the right to inspect all information obtained and/or recorded in the course of the telemedicine visit and may receive copies of available information for a reasonable fee.  I understand that some of the potential risks of receiving the Services via telemedicine include:   Delay or interruption in medical evaluation due to technological equipment failure or disruption;  Information transmitted may not be sufficient (e.g. poor resolution of images) to allow for appropriate medical decision making by the Practitioner; and/or   In rare instances, security protocols could fail, causing a breach of personal health information.  Furthermore, I acknowledge that it is my responsibility to provide information about my medical history, conditions and care that is complete and accurate to the best of my ability. I acknowledge that Practitioner's advice, recommendations, and/or decision may be based on factors not within their control, such as incomplete or inaccurate data provided by me or distortions of diagnostic images or specimens that may result from electronic  transmissions. I understand that the practice of medicine is not an exact science and that Practitioner makes no warranties or guarantees regarding treatment outcomes. I acknowledge that I will receive a copy of this consent concurrently upon execution via email to the email address I last provided but may also request a printed copy by calling the office of [facility].    I understand that my insurance will be billed for this visit.   I have read or had this consent read to me.  I understand the contents of this consent, which adequately explains the benefits and risks of the Services being provided via telemedicine.   I have been provided ample opportunity to ask questions  regarding this consent and the Services and have had my questions answered to my satisfaction.  I give my informed consent for the services to be provided through the use of telemedicine in my medical care  By participating in this telemedicine visit I agree to the above.

## 2018-07-13 ENCOUNTER — Emergency Department (HOSPITAL_COMMUNITY)
Admission: EM | Admit: 2018-07-13 | Discharge: 2018-07-13 | Disposition: A | Discharge status: Home / Self Care | Payer: Medicaid Other | Attending: Emergency Medicine | Admitting: Emergency Medicine

## 2018-07-13 ENCOUNTER — Encounter (HOSPITAL_COMMUNITY): Payer: Self-pay | Admitting: Emergency Medicine

## 2018-07-13 ENCOUNTER — Other Ambulatory Visit: Payer: Self-pay

## 2018-07-13 ENCOUNTER — Emergency Department (HOSPITAL_COMMUNITY): Payer: Medicaid Other

## 2018-07-13 DIAGNOSIS — I509 Heart failure, unspecified: Secondary | ICD-10-CM | POA: Diagnosis not present

## 2018-07-13 DIAGNOSIS — I2510 Atherosclerotic heart disease of native coronary artery without angina pectoris: Secondary | ICD-10-CM | POA: Diagnosis not present

## 2018-07-13 DIAGNOSIS — Z79899 Other long term (current) drug therapy: Secondary | ICD-10-CM | POA: Insufficient documentation

## 2018-07-13 DIAGNOSIS — Z7982 Long term (current) use of aspirin: Secondary | ICD-10-CM | POA: Diagnosis not present

## 2018-07-13 DIAGNOSIS — I10 Essential (primary) hypertension: Secondary | ICD-10-CM | POA: Diagnosis not present

## 2018-07-13 DIAGNOSIS — Y92002 Bathroom of unspecified non-institutional (private) residence single-family (private) house as the place of occurrence of the external cause: Secondary | ICD-10-CM | POA: Insufficient documentation

## 2018-07-13 DIAGNOSIS — W2209XA Striking against other stationary object, initial encounter: Secondary | ICD-10-CM | POA: Diagnosis not present

## 2018-07-13 DIAGNOSIS — F17210 Nicotine dependence, cigarettes, uncomplicated: Secondary | ICD-10-CM | POA: Diagnosis not present

## 2018-07-13 DIAGNOSIS — Y939 Activity, unspecified: Secondary | ICD-10-CM | POA: Diagnosis not present

## 2018-07-13 DIAGNOSIS — S8992XA Unspecified injury of left lower leg, initial encounter: Secondary | ICD-10-CM | POA: Diagnosis present

## 2018-07-13 DIAGNOSIS — L03116 Cellulitis of left lower limb: Secondary | ICD-10-CM | POA: Diagnosis not present

## 2018-07-13 DIAGNOSIS — S8012XA Contusion of left lower leg, initial encounter: Secondary | ICD-10-CM

## 2018-07-13 DIAGNOSIS — Y999 Unspecified external cause status: Secondary | ICD-10-CM | POA: Insufficient documentation

## 2018-07-13 HISTORY — DX: Essential (primary) hypertension: I10

## 2018-07-13 HISTORY — DX: Viral infection, unspecified: B34.9

## 2018-07-13 HISTORY — DX: Obstructive sleep apnea (adult) (pediatric): G47.33

## 2018-07-13 HISTORY — DX: Morbid (severe) obesity due to excess calories: E66.01

## 2018-07-13 HISTORY — DX: Epigastric pain: R10.13

## 2018-07-13 MED ORDER — CEPHALEXIN 500 MG PO CAPS: 500.0000 mg | ORAL_CAPSULE | Freq: Four times a day (QID) | ORAL | 0 refills | Status: DC
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
## 2018-07-13 NOTE — ED Notes (Signed)
Patient transported to X-ray 

## 2018-07-13 NOTE — Discharge Instructions (Addendum)
Please read attached information. If you experience any new or worsening signs or symptoms please return to the emergency room for evaluation. Please follow-up with your primary care provider or specialist as discussed. Please use medication prescribed only as directed and discontinue taking if you have any concerning signs or symptoms.   °
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
## 2018-07-14 ENCOUNTER — Encounter: Payer: Self-pay | Admitting: Cardiology

## 2018-07-14 ENCOUNTER — Telehealth: Payer: Self-pay | Admitting: Radiology

## 2018-07-14 ENCOUNTER — Telehealth (INDEPENDENT_AMBULATORY_CARE_PROVIDER_SITE_OTHER): Payer: Medicaid Other | Admitting: Cardiology

## 2018-07-14 VITALS — Ht 70.0 in | Wt 313.0 lb

## 2018-07-14 DIAGNOSIS — E7800 Pure hypercholesterolemia, unspecified: Secondary | ICD-10-CM | POA: Diagnosis not present

## 2018-07-14 DIAGNOSIS — G4733 Obstructive sleep apnea (adult) (pediatric): Secondary | ICD-10-CM

## 2018-07-14 DIAGNOSIS — I2510 Atherosclerotic heart disease of native coronary artery without angina pectoris: Secondary | ICD-10-CM

## 2018-07-14 DIAGNOSIS — Z720 Tobacco use: Secondary | ICD-10-CM | POA: Diagnosis not present

## 2018-07-14 DIAGNOSIS — R002 Palpitations: Secondary | ICD-10-CM

## 2018-07-14 DIAGNOSIS — I10 Essential (primary) hypertension: Secondary | ICD-10-CM

## 2018-07-14 MED ORDER — HYDRALAZINE HCL 25 MG PO TABS: 25.0000 mg | ORAL_TABLET | Freq: Three times a day (TID) | ORAL | 3 refills | Status: DC

## 2018-07-14 MED ORDER — VARENICLINE TARTRATE 0.5 MG X 11 & 1 MG X 42 PO MISC: ORAL | 0 refills | Status: DC

## 2018-07-14 MED ORDER — VARENICLINE TARTRATE 0.5 MG PO TABS: 0.5000 mg | ORAL_TABLET | Freq: Two times a day (BID) | ORAL | 1 refills | Status: DC

## 2018-07-14 MED ORDER — ASPIRIN EC 81 MG PO TBEC: 81.0000 mg | DELAYED_RELEASE_TABLET | Freq: Every day | ORAL | 3 refills | Status: DC

## 2018-07-14 NOTE — Telephone Encounter (Signed)
Enrolled patient for a 14 day Zio monitor to be mailed. Brief instructions were gone over with the patient and he knows to expect the monitor in 3-4 days

## 2018-07-14 NOTE — Patient Instructions (Signed)
Medication Instructions:  Decrease: Aspirin 81 mg, daily Start: Chantix  If you need a refill on your cardiac medications before your next appointment, please call your pharmacy.   Lab work: None If you have labs (blood work) drawn today and your tests are completely normal, you will receive your results only by: Marland Kitchen MyChart Message (if you have MyChart) OR . A paper copy in the mail If you have any lab test that is abnormal or we need to change your treatment, we will call you to review the results.  Testing/Procedures: 2 Week Zio Patch Monitor   Follow-Up: Call the office once you receive your CPAP so we can schedule a 10 week follow up visit.

## 2018-07-18 DIAGNOSIS — M79605 Pain in left leg: Secondary | ICD-10-CM | POA: Diagnosis not present

## 2018-07-18 DIAGNOSIS — E1142 Type 2 diabetes mellitus with diabetic polyneuropathy: Secondary | ICD-10-CM | POA: Diagnosis not present

## 2018-07-18 DIAGNOSIS — I5030 Unspecified diastolic (congestive) heart failure: Secondary | ICD-10-CM | POA: Diagnosis not present

## 2018-07-18 DIAGNOSIS — E559 Vitamin D deficiency, unspecified: Secondary | ICD-10-CM | POA: Diagnosis not present

## 2018-07-18 DIAGNOSIS — L03116 Cellulitis of left lower limb: Secondary | ICD-10-CM | POA: Diagnosis not present

## 2018-07-23 ENCOUNTER — Ambulatory Visit (INDEPENDENT_AMBULATORY_CARE_PROVIDER_SITE_OTHER): Payer: Medicaid Other

## 2018-07-23 DIAGNOSIS — R002 Palpitations: Secondary | ICD-10-CM | POA: Diagnosis not present

## 2018-07-25 ENCOUNTER — Telehealth: Payer: Self-pay | Admitting: *Deleted

## 2018-07-25 NOTE — Telephone Encounter (Signed)
Order placed to [facility] with all paperwork required for the new referral.

## 2018-07-25 NOTE — Telephone Encounter (Addendum)
-----   Message from Sarina Ill, RN sent at 07/14/2018 12:42 PM EDT ----- Sleep orders placed  Suezanne Jacquet ----- Message ----- From: [name], MD Sent: 07/14/2018  11:11 AM EDT To: Sarina Ill, RN  Order a Resmed CPAP with heated humidity and Respironic Dreamware under the nose FFM on auto from 4 to 20cm H2O.  He will need followup with me in 10 weeks after getting device.  Set him up with [facility].

## 2018-08-01 ENCOUNTER — Emergency Department (HOSPITAL_COMMUNITY)
Admission: EM | Admit: 2018-08-01 | Discharge: 2018-08-01 | Disposition: A | Discharge status: Home / Self Care | Payer: Medicaid Other | Attending: Emergency Medicine | Admitting: Emergency Medicine

## 2018-08-01 ENCOUNTER — Emergency Department (HOSPITAL_COMMUNITY): Payer: Medicaid Other

## 2018-08-01 ENCOUNTER — Encounter (HOSPITAL_COMMUNITY): Payer: Self-pay | Admitting: Emergency Medicine

## 2018-08-01 DIAGNOSIS — I10 Essential (primary) hypertension: Secondary | ICD-10-CM | POA: Insufficient documentation

## 2018-08-01 DIAGNOSIS — I509 Heart failure, unspecified: Secondary | ICD-10-CM | POA: Insufficient documentation

## 2018-08-01 DIAGNOSIS — Z79899 Other long term (current) drug therapy: Secondary | ICD-10-CM | POA: Diagnosis not present

## 2018-08-01 DIAGNOSIS — I2510 Atherosclerotic heart disease of native coronary artery without angina pectoris: Secondary | ICD-10-CM | POA: Insufficient documentation

## 2018-08-01 DIAGNOSIS — F17210 Nicotine dependence, cigarettes, uncomplicated: Secondary | ICD-10-CM | POA: Diagnosis not present

## 2018-08-01 DIAGNOSIS — R002 Palpitations: Secondary | ICD-10-CM | POA: Diagnosis not present

## 2018-08-01 DIAGNOSIS — R079 Chest pain, unspecified: Secondary | ICD-10-CM | POA: Diagnosis not present

## 2018-08-01 DIAGNOSIS — R000 Tachycardia, unspecified: Secondary | ICD-10-CM | POA: Diagnosis not present

## 2018-08-01 DIAGNOSIS — Z7982 Long term (current) use of aspirin: Secondary | ICD-10-CM | POA: Insufficient documentation

## 2018-08-01 DIAGNOSIS — R0689 Other abnormalities of breathing: Secondary | ICD-10-CM | POA: Diagnosis not present

## 2018-08-01 LAB — TROPONIN I: Troponin I: <0.03 ng/mL (ref <0.03)

## 2018-08-01 LAB — BASIC METABOLIC PANEL
Anion gap: 12 (ref 5–15)
BUN: 14 mg/dL (ref 6–20)
CO2: 22 mmol/L (ref 22–32)
Calcium: 9.3 mg/dL (ref 8.9–10.3)
Chloride: 105 mmol/L (ref 98–111)
Creatinine, Ser: 1.02 mg/dL (ref 0.61–1.24)
GFR calc Af Amer: >60 mL/min (ref >60)
GFR calc non Af Amer: >60 mL/min (ref >60)
Glucose, Bld: 122 mg/dL — ABNORMAL HIGH (ref 70–99)
Potassium: 3.8 mmol/L (ref 3.5–5.1)
Sodium: 139 mmol/L (ref 135–145)

## 2018-08-01 LAB — CBC
HCT: 38.5 % — ABNORMAL LOW (ref 39.0–52.0)
Hemoglobin: 12.6 g/dL — ABNORMAL LOW (ref 13.0–17.0)
MCH: 29.8 pg (ref 26.0–34.0)
MCHC: 32.7 g/dL (ref 30.0–36.0)
MCV: 91 fL (ref 80.0–100.0)
Platelets: 275 10*3/uL (ref 150–400)
RBC: 4.23 MIL/uL (ref 4.22–5.81)
RDW: 15.3 % (ref 11.5–15.5)
WBC: 10.3 10*3/uL (ref 4.0–10.5)
nRBC: 0 % (ref 0.0–0.2)

## 2018-08-01 MED ORDER — LORAZEPAM 1 MG PO TABS
1.0000 mg | ORAL_TABLET | Freq: Once | ORAL | Status: AC
  Administered 2018-08-01: 1 mg via ORAL
  Filled 2018-08-01: qty 1

## 2018-08-01 MED ORDER — ACETAMINOPHEN 500 MG PO TABS
1000.0000 mg | ORAL_TABLET | Freq: Once | ORAL | Status: AC
  Administered 2018-08-01: 04:00:00 1000 mg via ORAL
  Filled 2018-08-01: qty 2

## 2018-08-01 NOTE — ED Triage Notes (Signed)
Pt took 324 asp before EMS arrival.

## 2018-08-01 NOTE — ED Notes (Signed)
Patient verbalizes understanding of discharge instructions. Opportunity for questioning and answers were provided. Armband removed by staff, pt discharged from ED in wheelchair.  

## 2018-08-01 NOTE — ED Triage Notes (Signed)
BIB [facility] from home with sudden onset chest discomfort described as a pounding this afternoon. Per EMS pt found with BP of 230/120, given 1 nitro, systolic down to 774. 142/39 just before arrival. Pt wearing home monitor due to previous heart conditions. Pt also has cellulitis on legs with open wound noted to left lower leg. Pt taking abx. Hx of COPD and CHF.

## 2018-08-01 NOTE — Discharge Instructions (Addendum)
Thank you for allowing me to care for you today in the Emergency Department.   Follow-up with Dr. Radford Pax regarding your ZIO patch.  Keep the follow-up appoint with your primary care provider for recheck of the wound on your left lower leg.  Also discussed with her if you have been having more recurrent episodes of anxiety.  You can take 650 mg of Tylenol once every 6 hours for pain in your left leg.  You can also apply an ice pack for 15 to 20 minutes as frequently as needed.   Return to the emergency department if you persistently have elevated blood pressure despite taking all of your home medications as directed, if you develop high fevers, respiratory distress, if your finger your lips turn blue, or other new, concerning symptoms.
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
## 2018-08-02 DIAGNOSIS — K760 Fatty (change of) liver, not elsewhere classified: Secondary | ICD-10-CM | POA: Diagnosis not present

## 2018-08-16 ENCOUNTER — Telehealth: Payer: Self-pay | Admitting: Cardiology

## 2018-08-16 DIAGNOSIS — G4733 Obstructive sleep apnea (adult) (pediatric): Secondary | ICD-10-CM | POA: Diagnosis not present

## 2018-08-16 NOTE — Telephone Encounter (Signed)
New Message:   [place] call ing from [employer] is calling  To report abnormal

## 2018-08-16 NOTE — Telephone Encounter (Signed)
[place] from monitor [employer] calling to report pt had 43 pauses throughout the length of his monitor. Specifically strip #7 showing 3 pauses totaling 8.3 seconds in total.   The monitor is ready for review by physician.

## 2018-08-17 ENCOUNTER — Other Ambulatory Visit: Payer: Self-pay

## 2018-08-17 ENCOUNTER — Encounter (HOSPITAL_COMMUNITY): Payer: Self-pay | Admitting: Emergency Medicine

## 2018-08-17 ENCOUNTER — Emergency Department (HOSPITAL_COMMUNITY): Payer: Medicaid Other

## 2018-08-17 ENCOUNTER — Emergency Department (HOSPITAL_COMMUNITY)
Admission: EM | Admit: 2018-08-17 | Discharge: 2018-08-17 | Disposition: A | Discharge status: Home / Self Care | Payer: Medicaid Other | Attending: Emergency Medicine | Admitting: Emergency Medicine

## 2018-08-17 DIAGNOSIS — R079 Chest pain, unspecified: Secondary | ICD-10-CM | POA: Diagnosis not present

## 2018-08-17 DIAGNOSIS — I2510 Atherosclerotic heart disease of native coronary artery without angina pectoris: Secondary | ICD-10-CM | POA: Diagnosis not present

## 2018-08-17 DIAGNOSIS — R072 Precordial pain: Secondary | ICD-10-CM | POA: Insufficient documentation

## 2018-08-17 DIAGNOSIS — R0602 Shortness of breath: Secondary | ICD-10-CM | POA: Diagnosis not present

## 2018-08-17 DIAGNOSIS — R0789 Other chest pain: Secondary | ICD-10-CM | POA: Diagnosis present

## 2018-08-17 DIAGNOSIS — Z79899 Other long term (current) drug therapy: Secondary | ICD-10-CM | POA: Insufficient documentation

## 2018-08-17 DIAGNOSIS — F17210 Nicotine dependence, cigarettes, uncomplicated: Secondary | ICD-10-CM | POA: Insufficient documentation

## 2018-08-17 DIAGNOSIS — Z7982 Long term (current) use of aspirin: Secondary | ICD-10-CM | POA: Insufficient documentation

## 2018-08-17 DIAGNOSIS — I509 Heart failure, unspecified: Secondary | ICD-10-CM | POA: Diagnosis not present

## 2018-08-17 DIAGNOSIS — Z86711 Personal history of pulmonary embolism: Secondary | ICD-10-CM | POA: Diagnosis not present

## 2018-08-17 DIAGNOSIS — I10 Essential (primary) hypertension: Secondary | ICD-10-CM | POA: Insufficient documentation

## 2018-08-17 LAB — CBC
HCT: 38.9 % — ABNORMAL LOW (ref 39.0–52.0)
Hemoglobin: 12.8 g/dL — ABNORMAL LOW (ref 13.0–17.0)
MCH: 29.2 pg (ref 26.0–34.0)
MCHC: 32.9 g/dL (ref 30.0–36.0)
MCV: 88.6 fL (ref 80.0–100.0)
Platelets: 260 10*3/uL (ref 150–400)
RBC: 4.39 MIL/uL (ref 4.22–5.81)
RDW: 14.8 % (ref 11.5–15.5)
WBC: 12.7 10*3/uL — ABNORMAL HIGH (ref 4.0–10.5)
nRBC: 0 % (ref 0.0–0.2)

## 2018-08-17 LAB — BASIC METABOLIC PANEL
Anion gap: 10 (ref 5–15)
BUN: 18 mg/dL (ref 6–20)
CO2: 23 mmol/L (ref 22–32)
Calcium: 9.2 mg/dL (ref 8.9–10.3)
Chloride: 106 mmol/L (ref 98–111)
Creatinine, Ser: 1.23 mg/dL (ref 0.61–1.24)
GFR calc Af Amer: >60 mL/min (ref >60)
GFR calc non Af Amer: >60 mL/min (ref >60)
Glucose, Bld: 101 mg/dL — ABNORMAL HIGH (ref 70–99)
Potassium: 3.4 mmol/L — ABNORMAL LOW (ref 3.5–5.1)
Sodium: 139 mmol/L (ref 135–145)

## 2018-08-17 LAB — TROPONIN I (HIGH SENSITIVITY)
Troponin I (High Sensitivity): 23 ng/L — ABNORMAL HIGH (ref <18)
Troponin I (High Sensitivity): 18 ng/L — ABNORMAL HIGH (ref <18)

## 2018-08-17 MED ORDER — SODIUM CHLORIDE 0.9% FLUSH: 3.0000 mL | Freq: Once | INTRAVENOUS | Status: DC

## 2018-08-17 NOTE — ED Notes (Signed)
Patient verbalizes understanding of discharge instructions. Opportunity for questioning and answers were provided. Armband removed by staff, pt discharged from ED ambulatory.   

## 2018-08-17 NOTE — Discharge Instructions (Addendum)
We saw you in the ER for the chest pain/shortness of breath. All of our cardiac workup is normal, including labs, EKG and chest X-RAY are normal. We are not sure what is causing your discomfort, but we feel comfortable sending you home at this time. The workup in the ER is not complete, and you should follow up with your CARDIOLOGIST in 5-7 days.  Please return to the ER if you have worsening chest pain, shortness of breath, pain radiating to your jaw, shoulder, or back, sweats or fainting. Otherwise see the Cardiologist or your primary care doctor as requested.
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
## 2018-08-17 NOTE — ED Triage Notes (Signed)
Pt c/o sharp chest pain, left arm numbness and shortness of breath that started 1 hour PTA. Symptoms started while pt was working outside.

## 2018-08-18 ENCOUNTER — Encounter: Payer: Self-pay | Admitting: Cardiology

## 2018-08-18 NOTE — Telephone Encounter (Signed)
Patient has a 10 week follow up appointment scheduled for 10/26/18. Patient understands he needs to keep this appointment for insurance compliance. Patient was grateful for the call and thanked me.

## 2018-08-18 NOTE — Telephone Encounter (Signed)
Spoke with Chanetta Marshall NP-C, she reviewed the monitor and found that the pauses occurred during the night. The patient was unaware of any of these. His main concern was feeling weak and dizziness, but he stopped Imdur and he has felt better. He has no dizziness or balance issues.

## 2018-08-18 NOTE — Telephone Encounter (Signed)
Duplicate

## 2018-08-18 NOTE — Telephone Encounter (Signed)
New message:     Patient had a appt and would like for some one to call him he forgot to ask a question.

## 2018-08-19 ENCOUNTER — Telehealth (HOSPITAL_COMMUNITY): Payer: Self-pay | Admitting: Cardiology

## 2018-08-19 DIAGNOSIS — R001 Bradycardia, unspecified: Secondary | ICD-10-CM | POA: Diagnosis not present

## 2018-08-19 DIAGNOSIS — R079 Chest pain, unspecified: Secondary | ICD-10-CM | POA: Diagnosis not present

## 2018-08-19 DIAGNOSIS — R0600 Dyspnea, unspecified: Secondary | ICD-10-CM | POA: Diagnosis not present

## 2018-08-19 NOTE — Telephone Encounter (Signed)
I have read the patient's 14-day ZIO patch monitor that was performed for palpitations, it is notable for significant bradycardia down to 30s as well as single atrial arrest with 3 pauses lasting 4.9 seconds.  This is during awake hours as well.  I have called the patient who is currently 20 miles away from home, he is with family member will drive him back and monitor him closely.  The patient states that he is having daily episodes of fatigue and dizziness however no presyncope or syncope.  He took his carvedilol this morning at 9.  He is advised to hydrate well potentially use caffeinated drink, stop using carvedilol altogether.  Ideally we will bring patient in for admission but in the current situation with increased number of COVID patient is in the ER we will try to monitor her from home, we will arrange for patient to be seen in the clinic on Monday or Tuesday.  Repeat event monitor after his carvedilol washed out.  Ena Dawley, MD 08/19/2018

## 2018-08-20 DIAGNOSIS — I10 Essential (primary) hypertension: Secondary | ICD-10-CM | POA: Diagnosis not present

## 2018-08-20 DIAGNOSIS — F1290 Cannabis use, unspecified, uncomplicated: Secondary | ICD-10-CM | POA: Insufficient documentation

## 2018-08-20 DIAGNOSIS — R001 Bradycardia, unspecified: Secondary | ICD-10-CM | POA: Diagnosis not present

## 2018-08-20 DIAGNOSIS — G4733 Obstructive sleep apnea (adult) (pediatric): Secondary | ICD-10-CM | POA: Diagnosis not present

## 2018-08-20 DIAGNOSIS — F1411 Cocaine abuse, in remission: Secondary | ICD-10-CM | POA: Insufficient documentation

## 2018-08-20 DIAGNOSIS — R079 Chest pain, unspecified: Secondary | ICD-10-CM | POA: Diagnosis not present

## 2018-08-20 DIAGNOSIS — Z6841 Body Mass Index (BMI) 40.0 and over, adult: Secondary | ICD-10-CM | POA: Insufficient documentation

## 2018-08-21 DIAGNOSIS — I440 Atrioventricular block, first degree: Secondary | ICD-10-CM | POA: Diagnosis not present

## 2018-08-21 NOTE — Telephone Encounter (Signed)
Spoke with the patient, advised her that he would need a visit with an APP early this week. However, he stated he could not come this week and he would need to schedule after the 4th of July. He accepted seeing Melina Copa, PA-C, to go over his medications.

## 2018-08-21 NOTE — Telephone Encounter (Signed)
Attempted to call the patient, his number blocked our incoming calls. Called daughter, she will have the patient call.

## 2018-08-23 DIAGNOSIS — G4733 Obstructive sleep apnea (adult) (pediatric): Secondary | ICD-10-CM | POA: Diagnosis not present

## 2018-08-28 ENCOUNTER — Telehealth: Payer: Self-pay | Admitting: Physician Assistant

## 2018-08-28 ENCOUNTER — Encounter: Payer: Self-pay | Admitting: Physician Assistant
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
## 2018-08-28 NOTE — Telephone Encounter (Signed)

## 2018-08-29 ENCOUNTER — Other Ambulatory Visit: Payer: Self-pay

## 2018-08-29 ENCOUNTER — Encounter: Payer: Self-pay | Admitting: Physician Assistant

## 2018-08-29 ENCOUNTER — Ambulatory Visit: Payer: Medicaid Other | Admitting: Physician Assistant

## 2018-08-29 VITALS — BP 122/80 | HR 93 | Ht 71.0 in | Wt 321.4 lb

## 2018-08-29 DIAGNOSIS — R0789 Other chest pain: Secondary | ICD-10-CM | POA: Diagnosis not present

## 2018-08-29 DIAGNOSIS — E876 Hypokalemia: Secondary | ICD-10-CM

## 2018-08-29 DIAGNOSIS — R42 Dizziness and giddiness: Secondary | ICD-10-CM

## 2018-08-29 DIAGNOSIS — H81399 Other peripheral vertigo, unspecified ear: Secondary | ICD-10-CM | POA: Diagnosis not present

## 2018-08-29 DIAGNOSIS — I2510 Atherosclerotic heart disease of native coronary artery without angina pectoris: Secondary | ICD-10-CM | POA: Diagnosis not present

## 2018-08-29 DIAGNOSIS — R002 Palpitations: Secondary | ICD-10-CM | POA: Diagnosis not present

## 2018-08-29 DIAGNOSIS — R001 Bradycardia, unspecified: Secondary | ICD-10-CM

## 2018-08-29 NOTE — Patient Instructions (Addendum)
Medication Instructions:  Your physician recommends that you continue on your current medications as directed. Please refer to the Current Medication list given to you today.  If you need a refill on your cardiac medications before your next appointment, please call your pharmacy.   Lab work: TODAY:  BMET, TSH, & MAGNESIUM  If you have labs (blood work) drawn today and your tests are completely normal, you will receive your results only by: Marland Kitchen MyChart Message (if you have MyChart) OR . A paper copy in the mail If you have any lab test that is abnormal or we need to change your treatment, we will call you to review the results.  Testing/Procedures: Non-Cardiac CT scanning, (CAT scanning), is a noninvasive, special x-ray that produces cross-sectional images of the body using x-rays and a computer. CT scans help physicians diagnose and treat medical conditions. For some CT exams, a contrast material is used to enhance visibility in the area of the body being studied. CT scans provide greater clarity and reveal more details than regular x-ray exams.  Your physician has recommended that you wear an event monitor. Event monitors are medical devices that record the heart's electrical activity. Doctors most often [place] these monitors to diagnose arrhythmias. Arrhythmias are problems with the speed or rhythm of the heartbeat. The monitor is a small, portable device. You can wear one while you do your normal daily activities. This is usually used to diagnose what is causing palpitations/syncope (passing out).    Follow-Up: At [facility], you and your health needs are our priority.  As part of our continuing mission to provide you with exceptional heart care, we have created designated Provider Care Teams.  These Care Teams include your primary Cardiologist (physician) and Advanced Practice Providers (APPs -  Physician Assistants and Nurse Practitioners) who all work together to provide you with the care  you need, when you need it. You are scheduled for a follow-up appointment with Melina Copa, PA-C, 10/11/2018 at 2:15.  Please arrive 15 minutes early to this appointment.   Any Other Special Instructions Will Be Listed Below (If Applicable).  CT Scan  A CT scan is a kind of X-ray. A CT scan makes pictures of the inside of your body. In this procedure, the pictures will be taken in a large machine that has an opening (CT scanner). What happens before the procedure? Staying hydrated Follow instructions from your doctor about hydration, which may include:  Up to 2 hours before the procedure - you may continue to drink clear liquids. These include water, clear fruit juice, black coffee, and plain tea. Eating and drinking restrictions Follow instructions from your doctor about eating and drinking, which may include:  24 hours before the procedure - stop drinking caffeinated drinks. These include energy drinks, tea, soda, coffee, and hot chocolate.  8 hours before the procedure - stop eating heavy meals or foods. These include meat, fried foods, or fatty foods.  6 hours before the procedure - stop eating light meals or foods. These include toast or cereal.  6 hours before the procedure - stop drinking milk or drinks that have milk in them.  2 hours before the procedure - stop drinking clear liquids. General instructions  Take off any jewelry.  Ask your doctor about changing or stopping your normal medicines. This is important if you take diabetes medicines or blood thinners. What happens during the procedure?  You will lie on a table with your arms above your head.  An  IV tube may be put into one of your veins.  Dye may be put into the IV tube. You may feel warm or have a metal taste in your mouth.  The table you will be lying on will move into the CT scanner.  You will be able to see, hear, and talk to the person who is running the machine while you are in it. Follow that person's  directions.  The machine will move around you to take pictures. Do not move.  When the machine is done taking pictures, it will be turned off.  The table will be moved out of the machine.  Your IV tube will be taken out. The procedure may vary among doctors and hospitals. What happens after the procedure?  It is up to you to get your results. Ask when your results will be ready. Summary  A CT scan is a kind of X-ray.  A CT scan makes pictures of the inside of your body.  Follow instructions from your doctor about eating and drinking before the procedure.  You will be able to see, hear, and talk to the person who is running the machine while you are in it. Follow that person's directions. This information is not intended to replace advice given to you by your health care provider. Make sure you discuss any questions you have with your health care provider. Document Released: 05/07/2008 Document Revised: 02/26/2016 Document Reviewed: 02/26/2016 Elsevier Patient Education  2020 [employer].

## 2018-08-30 ENCOUNTER — Telehealth: Payer: Self-pay | Admitting: Radiology

## 2018-08-30 LAB — BASIC METABOLIC PANEL
BUN/Creatinine Ratio: 13 (ref 10–24)
BUN: 11 mg/dL (ref 8–27)
CO2: 23 mmol/L (ref 20–29)
Calcium: 9.5 mg/dL (ref 8.6–10.2)
Chloride: 103 mmol/L (ref 96–106)
Creatinine, Ser: 0.82 mg/dL (ref 0.76–1.27)
GFR calc Af Amer: 111 mL/min/{1.73_m2} (ref >59)
GFR calc non Af Amer: 96 mL/min/{1.73_m2} (ref >59)
Glucose: 105 mg/dL — ABNORMAL HIGH (ref 65–99)
Potassium: 4.3 mmol/L (ref 3.5–5.2)
Sodium: 140 mmol/L (ref 134–144)

## 2018-08-30 LAB — TSH: TSH: 1.04 u[IU]/mL (ref 0.450–4.500)

## 2018-08-30 LAB — MAGNESIUM: Magnesium: 2 mg/dL (ref 1.6–2.3)

## 2018-08-30 NOTE — Telephone Encounter (Signed)
Enrolled patient for a 2 week Preventcie Event monitor to be mailed. Brief instructions were gone over with the patient and he knows to expect the monitor to arrive in 3-4 days

## 2018-08-31 ENCOUNTER — Encounter (HOSPITAL_COMMUNITY): Payer: Self-pay

## 2018-08-31 ENCOUNTER — Emergency Department (HOSPITAL_COMMUNITY): Payer: Medicaid Other

## 2018-08-31 ENCOUNTER — Emergency Department (HOSPITAL_COMMUNITY)
Admission: EM | Admit: 2018-08-31 | Discharge: 2018-09-01 | Disposition: A | Discharge status: Home / Self Care | Payer: Medicaid Other | Attending: Emergency Medicine | Admitting: Emergency Medicine

## 2018-08-31 ENCOUNTER — Other Ambulatory Visit: Payer: Self-pay

## 2018-08-31 DIAGNOSIS — F17210 Nicotine dependence, cigarettes, uncomplicated: Secondary | ICD-10-CM | POA: Diagnosis not present

## 2018-08-31 DIAGNOSIS — Z7982 Long term (current) use of aspirin: Secondary | ICD-10-CM | POA: Insufficient documentation

## 2018-08-31 DIAGNOSIS — I110 Hypertensive heart disease with heart failure: Secondary | ICD-10-CM | POA: Diagnosis not present

## 2018-08-31 DIAGNOSIS — S81802A Unspecified open wound, left lower leg, initial encounter: Secondary | ICD-10-CM

## 2018-08-31 DIAGNOSIS — I259 Chronic ischemic heart disease, unspecified: Secondary | ICD-10-CM | POA: Insufficient documentation

## 2018-08-31 DIAGNOSIS — L03116 Cellulitis of left lower limb: Secondary | ICD-10-CM | POA: Insufficient documentation

## 2018-08-31 DIAGNOSIS — I5032 Chronic diastolic (congestive) heart failure: Secondary | ICD-10-CM | POA: Insufficient documentation

## 2018-08-31 DIAGNOSIS — M7989 Other specified soft tissue disorders: Secondary | ICD-10-CM | POA: Diagnosis not present

## 2018-08-31 LAB — CBC WITH DIFFERENTIAL/PLATELET
Abs Immature Granulocytes: 0.02 10*3/uL (ref 0.00–0.07)
Basophils Absolute: 0 10*3/uL (ref 0.0–0.1)
Basophils Relative: 0 %
Eosinophils Absolute: 0.2 10*3/uL (ref 0.0–0.5)
Eosinophils Relative: 2 %
HCT: 38.7 % — ABNORMAL LOW (ref 39.0–52.0)
Hemoglobin: 12.8 g/dL — ABNORMAL LOW (ref 13.0–17.0)
Immature Granulocytes: 0 %
Lymphocytes Relative: 26 %
Lymphs Abs: 2.3 10*3/uL (ref 0.7–4.0)
MCH: 30.1 pg (ref 26.0–34.0)
MCHC: 33.1 g/dL (ref 30.0–36.0)
MCV: 91.1 fL (ref 80.0–100.0)
Monocytes Absolute: 0.8 10*3/uL (ref 0.1–1.0)
Monocytes Relative: 8 %
Neutro Abs: 5.6 10*3/uL (ref 1.7–7.7)
Neutrophils Relative %: 64 %
Platelets: 233 10*3/uL (ref 150–400)
RBC: 4.25 MIL/uL (ref 4.22–5.81)
RDW: 14.8 % (ref 11.5–15.5)
WBC: 8.9 10*3/uL (ref 4.0–10.5)
nRBC: 0 % (ref 0.0–0.2)

## 2018-08-31 LAB — COMPREHENSIVE METABOLIC PANEL
ALT: 26 U/L (ref 0–44)
AST: 23 U/L (ref 15–41)
Albumin: 3.7 g/dL (ref 3.5–5.0)
Alkaline Phosphatase: 76 U/L (ref 38–126)
Anion gap: 10 (ref 5–15)
BUN: 11 mg/dL (ref 6–20)
CO2: 24 mmol/L (ref 22–32)
Calcium: 8.9 mg/dL (ref 8.9–10.3)
Chloride: 105 mmol/L (ref 98–111)
Creatinine, Ser: 0.84 mg/dL (ref 0.61–1.24)
GFR calc Af Amer: >60 mL/min (ref >60)
GFR calc non Af Amer: >60 mL/min (ref >60)
Glucose, Bld: 134 mg/dL — ABNORMAL HIGH (ref 70–99)
Potassium: 3.4 mmol/L — ABNORMAL LOW (ref 3.5–5.1)
Sodium: 139 mmol/L (ref 135–145)
Total Bilirubin: 0.8 mg/dL (ref 0.3–1.2)
Total Protein: 6.6 g/dL (ref 6.5–8.1)

## 2018-08-31 LAB — LACTIC ACID, PLASMA: Lactic Acid, Venous: 1.5 mmol/L (ref 0.5–1.9)

## 2018-08-31 MED ORDER — SODIUM CHLORIDE 0.9% FLUSH: 3.0000 mL | Freq: Once | INTRAVENOUS | Status: DC

## 2018-08-31 NOTE — ED Triage Notes (Signed)
Pt hit his left leg on the bathtub and it turned into cellulitis. Open wound noted with purulent drainage. PCP put him on abx but continues to worsen. Not diabetic.

## 2018-09-01 MED ORDER — DOXYCYCLINE HYCLATE 100 MG PO CAPS: 100.0000 mg | ORAL_CAPSULE | Freq: Two times a day (BID) | ORAL | 0 refills | Status: DC

## 2018-09-01 MED ORDER — CEPHALEXIN 500 MG PO CAPS: 500.0000 mg | ORAL_CAPSULE | Freq: Four times a day (QID) | ORAL | 0 refills | Status: DC

## 2018-09-01 MED ORDER — HYDROCODONE-ACETAMINOPHEN 5-325 MG PO TABS: 1.0000 | ORAL_TABLET | ORAL | 0 refills | Status: DC | PRN

## 2018-09-01 MED ORDER — OXYCODONE-ACETAMINOPHEN 5-325 MG PO TABS
1.0000 | ORAL_TABLET | Freq: Once | ORAL | Status: AC
  Administered 2018-09-01: 04:00:00 1 via ORAL
  Filled 2018-09-01: qty 1

## 2018-09-01 NOTE — ED Notes (Signed)
Patient verbalizes understanding of discharge instructions. Opportunity for questioning and answers were provided. Armband removed by staff, pt discharged from ED in wheelchair.  

## 2018-09-01 NOTE — Discharge Instructions (Addendum)
Please follow up with the [facility] for further management of ongoing open wound to left leg.

## 2018-09-01 NOTE — ED Notes (Signed)
Pt has open sore to the lower left leg w/ drainage.

## 2018-09-01 NOTE — ED Notes (Signed)
Pt's wounds irrigated and new dressings applied.
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
## 2018-09-05 ENCOUNTER — Encounter (INDEPENDENT_AMBULATORY_CARE_PROVIDER_SITE_OTHER): Payer: Medicaid Other

## 2018-09-05 ENCOUNTER — Encounter: Payer: Self-pay | Admitting: Cardiology

## 2018-09-05 DIAGNOSIS — R001 Bradycardia, unspecified: Secondary | ICD-10-CM | POA: Diagnosis not present

## 2018-09-09 ENCOUNTER — Telehealth: Payer: Self-pay | Admitting: Physician Assistant

## 2018-09-09 NOTE — Telephone Encounter (Signed)
   Received page from [name] about episode of sinus bradycardia 30bpm lasting 18 seconds at 6am. Spoke with patient, he was asymptomatic/sleeping (was not using CPAP). No longer on BB. Rhythm likely due to untreated OSA - have reviewed with patient. F/u tracing showed NSR after bradycardia ended. Pt feels fine. No acute intervention needed. Pt will continue to wear monitor to assess for any awake HR disturbances. The patient verbalized understanding and gratitude.  Russell Collier

## 2018-09-11 NOTE — Telephone Encounter (Signed)
Day 5 (critical) strip received from 09/09/18 5:41 am HR 30, auto triggered, bradyarrhythmia. Will place in Dayna's mailbox for review/signature upon return to the office.

## 2018-09-14 ENCOUNTER — Other Ambulatory Visit: Payer: Self-pay

## 2018-09-14 ENCOUNTER — Encounter (HOSPITAL_BASED_OUTPATIENT_CLINIC_OR_DEPARTMENT_OTHER): Payer: Medicaid Other | Attending: Internal Medicine

## 2018-09-14 DIAGNOSIS — I110 Hypertensive heart disease with heart failure: Secondary | ICD-10-CM | POA: Diagnosis not present

## 2018-09-14 DIAGNOSIS — I87332 Chronic venous hypertension (idiopathic) with ulcer and inflammation of left lower extremity: Secondary | ICD-10-CM | POA: Insufficient documentation

## 2018-09-14 DIAGNOSIS — G4730 Sleep apnea, unspecified: Secondary | ICD-10-CM | POA: Diagnosis not present

## 2018-09-14 DIAGNOSIS — I509 Heart failure, unspecified: Secondary | ICD-10-CM | POA: Insufficient documentation

## 2018-09-14 DIAGNOSIS — I2510 Atherosclerotic heart disease of native coronary artery without angina pectoris: Secondary | ICD-10-CM | POA: Insufficient documentation

## 2018-09-14 DIAGNOSIS — G629 Polyneuropathy, unspecified: Secondary | ICD-10-CM | POA: Insufficient documentation

## 2018-09-14 DIAGNOSIS — L97822 Non-pressure chronic ulcer of other part of left lower leg with fat layer exposed: Secondary | ICD-10-CM | POA: Diagnosis not present

## 2018-09-14 DIAGNOSIS — I87312 Chronic venous hypertension (idiopathic) with ulcer of left lower extremity: Secondary | ICD-10-CM | POA: Diagnosis not present

## 2018-09-20 DIAGNOSIS — I87332 Chronic venous hypertension (idiopathic) with ulcer and inflammation of left lower extremity: Secondary | ICD-10-CM | POA: Diagnosis not present

## 2018-09-20 DIAGNOSIS — L97822 Non-pressure chronic ulcer of other part of left lower leg with fat layer exposed: Secondary | ICD-10-CM | POA: Diagnosis not present

## 2018-09-20 DIAGNOSIS — I110 Hypertensive heart disease with heart failure: Secondary | ICD-10-CM | POA: Diagnosis not present

## 2018-09-20 DIAGNOSIS — I509 Heart failure, unspecified: Secondary | ICD-10-CM | POA: Diagnosis not present

## 2018-09-20 DIAGNOSIS — G629 Polyneuropathy, unspecified: Secondary | ICD-10-CM | POA: Diagnosis not present

## 2018-09-20 DIAGNOSIS — I2510 Atherosclerotic heart disease of native coronary artery without angina pectoris: Secondary | ICD-10-CM | POA: Diagnosis not present

## 2018-09-20 DIAGNOSIS — G4730 Sleep apnea, unspecified: Secondary | ICD-10-CM | POA: Diagnosis not present

## 2018-09-23 DIAGNOSIS — G4733 Obstructive sleep apnea (adult) (pediatric): Secondary | ICD-10-CM | POA: Diagnosis not present

## 2018-09-25 ENCOUNTER — Encounter (HOSPITAL_BASED_OUTPATIENT_CLINIC_OR_DEPARTMENT_OTHER): Payer: Medicaid Other | Attending: Internal Medicine

## 2018-09-25 ENCOUNTER — Inpatient Hospital Stay: Admission: RE | Admit: 2018-09-25 | Payer: Medicaid Other | Source: Ambulatory Visit

## 2018-09-25 ENCOUNTER — Other Ambulatory Visit: Payer: Self-pay

## 2018-09-25 DIAGNOSIS — I509 Heart failure, unspecified: Secondary | ICD-10-CM | POA: Insufficient documentation

## 2018-09-25 DIAGNOSIS — I110 Hypertensive heart disease with heart failure: Secondary | ICD-10-CM | POA: Insufficient documentation

## 2018-09-25 DIAGNOSIS — L97822 Non-pressure chronic ulcer of other part of left lower leg with fat layer exposed: Secondary | ICD-10-CM | POA: Insufficient documentation

## 2018-09-25 DIAGNOSIS — I87332 Chronic venous hypertension (idiopathic) with ulcer and inflammation of left lower extremity: Secondary | ICD-10-CM | POA: Diagnosis not present

## 2018-09-25 DIAGNOSIS — I2510 Atherosclerotic heart disease of native coronary artery without angina pectoris: Secondary | ICD-10-CM | POA: Diagnosis not present

## 2018-09-25 DIAGNOSIS — G4730 Sleep apnea, unspecified: Secondary | ICD-10-CM | POA: Diagnosis not present

## 2018-09-25 DIAGNOSIS — G629 Polyneuropathy, unspecified: Secondary | ICD-10-CM | POA: Insufficient documentation

## 2018-09-25 DIAGNOSIS — I87312 Chronic venous hypertension (idiopathic) with ulcer of left lower extremity: Secondary | ICD-10-CM | POA: Diagnosis not present

## 2018-09-26 ENCOUNTER — Inpatient Hospital Stay (HOSPITAL_COMMUNITY)
Admission: EM | Admit: 2018-09-26 | Discharge: 2018-09-29 | DRG: 303 | Disposition: A | Discharge status: Home / Self Care | Payer: Medicaid Other | Attending: Internal Medicine | Admitting: Internal Medicine

## 2018-09-26 ENCOUNTER — Emergency Department (HOSPITAL_COMMUNITY): Payer: Medicaid Other

## 2018-09-26 ENCOUNTER — Observation Stay (HOSPITAL_COMMUNITY): Payer: Medicaid Other

## 2018-09-26 ENCOUNTER — Encounter (HOSPITAL_COMMUNITY): Payer: Self-pay | Admitting: Internal Medicine

## 2018-09-26 ENCOUNTER — Other Ambulatory Visit: Payer: Self-pay

## 2018-09-26 DIAGNOSIS — Z8619 Personal history of other infectious and parasitic diseases: Secondary | ICD-10-CM

## 2018-09-26 DIAGNOSIS — R079 Chest pain, unspecified: Secondary | ICD-10-CM | POA: Diagnosis not present

## 2018-09-26 DIAGNOSIS — I5032 Chronic diastolic (congestive) heart failure: Secondary | ICD-10-CM | POA: Diagnosis not present

## 2018-09-26 DIAGNOSIS — Y92009 Unspecified place in unspecified non-institutional (private) residence as the place of occurrence of the external cause: Secondary | ICD-10-CM

## 2018-09-26 DIAGNOSIS — Z9114 Patient's other noncompliance with medication regimen: Secondary | ICD-10-CM

## 2018-09-26 DIAGNOSIS — G4733 Obstructive sleep apnea (adult) (pediatric): Secondary | ICD-10-CM | POA: Diagnosis present

## 2018-09-26 DIAGNOSIS — I2583 Coronary atherosclerosis due to lipid rich plaque: Secondary | ICD-10-CM | POA: Diagnosis not present

## 2018-09-26 DIAGNOSIS — Z7982 Long term (current) use of aspirin: Secondary | ICD-10-CM

## 2018-09-26 DIAGNOSIS — Z86718 Personal history of other venous thrombosis and embolism: Secondary | ICD-10-CM

## 2018-09-26 DIAGNOSIS — Z86711 Personal history of pulmonary embolism: Secondary | ICD-10-CM

## 2018-09-26 DIAGNOSIS — R0689 Other abnormalities of breathing: Secondary | ICD-10-CM | POA: Diagnosis not present

## 2018-09-26 DIAGNOSIS — I872 Venous insufficiency (chronic) (peripheral): Secondary | ICD-10-CM | POA: Diagnosis present

## 2018-09-26 DIAGNOSIS — M1990 Unspecified osteoarthritis, unspecified site: Secondary | ICD-10-CM | POA: Diagnosis present

## 2018-09-26 DIAGNOSIS — I25118 Atherosclerotic heart disease of native coronary artery with other forms of angina pectoris: Principal | ICD-10-CM | POA: Diagnosis present

## 2018-09-26 DIAGNOSIS — I10 Essential (primary) hypertension: Secondary | ICD-10-CM | POA: Diagnosis not present

## 2018-09-26 DIAGNOSIS — I110 Hypertensive heart disease with heart failure: Secondary | ICD-10-CM | POA: Diagnosis present

## 2018-09-26 DIAGNOSIS — F40240 Claustrophobia: Secondary | ICD-10-CM | POA: Diagnosis present

## 2018-09-26 DIAGNOSIS — R42 Dizziness and giddiness: Secondary | ICD-10-CM | POA: Diagnosis present

## 2018-09-26 DIAGNOSIS — E785 Hyperlipidemia, unspecified: Secondary | ICD-10-CM | POA: Diagnosis present

## 2018-09-26 DIAGNOSIS — Z79899 Other long term (current) drug therapy: Secondary | ICD-10-CM

## 2018-09-26 DIAGNOSIS — I2510 Atherosclerotic heart disease of native coronary artery without angina pectoris: Secondary | ICD-10-CM | POA: Diagnosis not present

## 2018-09-26 DIAGNOSIS — R0602 Shortness of breath: Secondary | ICD-10-CM | POA: Diagnosis not present

## 2018-09-26 DIAGNOSIS — R002 Palpitations: Secondary | ICD-10-CM | POA: Diagnosis not present

## 2018-09-26 DIAGNOSIS — Z209 Contact with and (suspected) exposure to unspecified communicable disease: Secondary | ICD-10-CM | POA: Diagnosis not present

## 2018-09-26 DIAGNOSIS — I5030 Unspecified diastolic (congestive) heart failure: Secondary | ICD-10-CM

## 2018-09-26 DIAGNOSIS — Z8249 Family history of ischemic heart disease and other diseases of the circulatory system: Secondary | ICD-10-CM

## 2018-09-26 DIAGNOSIS — S81802A Unspecified open wound, left lower leg, initial encounter: Secondary | ICD-10-CM

## 2018-09-26 DIAGNOSIS — I5033 Acute on chronic diastolic (congestive) heart failure: Secondary | ICD-10-CM | POA: Diagnosis not present

## 2018-09-26 DIAGNOSIS — R6889 Other general symptoms and signs: Secondary | ICD-10-CM

## 2018-09-26 DIAGNOSIS — Z9989 Dependence on other enabling machines and devices: Secondary | ICD-10-CM

## 2018-09-26 DIAGNOSIS — B356 Tinea cruris: Secondary | ICD-10-CM | POA: Diagnosis present

## 2018-09-26 DIAGNOSIS — J90 Pleural effusion, not elsewhere classified: Secondary | ICD-10-CM | POA: Diagnosis not present

## 2018-09-26 DIAGNOSIS — Z9079 Acquired absence of other genital organ(s): Secondary | ICD-10-CM

## 2018-09-26 DIAGNOSIS — R072 Precordial pain: Secondary | ICD-10-CM

## 2018-09-26 DIAGNOSIS — Z20828 Contact with and (suspected) exposure to other viral communicable diseases: Secondary | ICD-10-CM | POA: Diagnosis present

## 2018-09-26 DIAGNOSIS — G609 Hereditary and idiopathic neuropathy, unspecified: Secondary | ICD-10-CM

## 2018-09-26 DIAGNOSIS — F17210 Nicotine dependence, cigarettes, uncomplicated: Secondary | ICD-10-CM

## 2018-09-26 DIAGNOSIS — R7989 Other specified abnormal findings of blood chemistry: Secondary | ICD-10-CM | POA: Diagnosis not present

## 2018-09-26 DIAGNOSIS — R2681 Unsteadiness on feet: Secondary | ICD-10-CM | POA: Diagnosis present

## 2018-09-26 DIAGNOSIS — X58XXXA Exposure to other specified factors, initial encounter: Secondary | ICD-10-CM

## 2018-09-26 DIAGNOSIS — E876 Hypokalemia: Secondary | ICD-10-CM | POA: Diagnosis present

## 2018-09-26 DIAGNOSIS — F419 Anxiety disorder, unspecified: Secondary | ICD-10-CM | POA: Diagnosis present

## 2018-09-26 DIAGNOSIS — Z91128 Patient's intentional underdosing of medication regimen for other reason: Secondary | ICD-10-CM

## 2018-09-26 DIAGNOSIS — T445X6A Underdosing of predominantly beta-adrenoreceptor agonists, initial encounter: Secondary | ICD-10-CM | POA: Diagnosis present

## 2018-09-26 LAB — CBC WITH DIFFERENTIAL/PLATELET
Abs Immature Granulocytes: 0.03 10*3/uL (ref 0.00–0.07)
Basophils Absolute: 0 10*3/uL (ref 0.0–0.1)
Basophils Relative: 0 %
Eosinophils Absolute: 0.1 10*3/uL (ref 0.0–0.5)
Eosinophils Relative: 2 %
HCT: 40.1 % (ref 39.0–52.0)
Hemoglobin: 12.9 g/dL — ABNORMAL LOW (ref 13.0–17.0)
Immature Granulocytes: 0 %
Lymphocytes Relative: 28 %
Lymphs Abs: 2.1 10*3/uL (ref 0.7–4.0)
MCH: 29.5 pg (ref 26.0–34.0)
MCHC: 32.2 g/dL (ref 30.0–36.0)
MCV: 91.8 fL (ref 80.0–100.0)
Monocytes Absolute: 0.8 10*3/uL (ref 0.1–1.0)
Monocytes Relative: 11 %
Neutro Abs: 4.2 10*3/uL (ref 1.7–7.7)
Neutrophils Relative %: 59 %
Platelets: 246 10*3/uL (ref 150–400)
RBC: 4.37 MIL/uL (ref 4.22–5.81)
RDW: 15.2 % (ref 11.5–15.5)
WBC: 7.2 10*3/uL (ref 4.0–10.5)
nRBC: 0 % (ref 0.0–0.2)

## 2018-09-26 LAB — BASIC METABOLIC PANEL
Anion gap: 9 (ref 5–15)
BUN: 5 mg/dL — ABNORMAL LOW (ref 6–20)
CO2: 27 mmol/L (ref 22–32)
Calcium: 8.8 mg/dL — ABNORMAL LOW (ref 8.9–10.3)
Chloride: 105 mmol/L (ref 98–111)
Creatinine, Ser: 0.83 mg/dL (ref 0.61–1.24)
GFR calc Af Amer: >60 mL/min (ref >60)
GFR calc non Af Amer: >60 mL/min (ref >60)
Glucose, Bld: 109 mg/dL — ABNORMAL HIGH (ref 70–99)
Potassium: 3.4 mmol/L — ABNORMAL LOW (ref 3.5–5.1)
Sodium: 141 mmol/L (ref 135–145)

## 2018-09-26 LAB — SARS CORONAVIRUS 2 (TAT 6-24 HRS): SARS Coronavirus 2: NEGATIVE

## 2018-09-26 LAB — VITAMIN B12: Vitamin B-12: 390 pg/mL (ref 180–914)

## 2018-09-26 LAB — BRAIN NATRIURETIC PEPTIDE: B Natriuretic Peptide: 67.4 pg/mL (ref 0.0–100.0)

## 2018-09-26 LAB — TROPONIN I (HIGH SENSITIVITY)
Troponin I (High Sensitivity): 21 ng/L — ABNORMAL HIGH (ref <18)
Troponin I (High Sensitivity): 23 ng/L — ABNORMAL HIGH (ref <18)

## 2018-09-26 MED ORDER — ENALAPRIL MALEATE 20 MG PO TABS
40.0000 mg | ORAL_TABLET | Freq: Every day | ORAL | Status: DC
  Administered 2018-09-27 – 2018-09-29 (×3): 40 mg via ORAL
  Filled 2018-09-26 (×3): qty 2

## 2018-09-26 MED ORDER — PANTOPRAZOLE SODIUM 40 MG PO TBEC
40.0000 mg | DELAYED_RELEASE_TABLET | Freq: Every day | ORAL | Status: DC
  Administered 2018-09-26 – 2018-09-29 (×4): 40 mg via ORAL
  Filled 2018-09-26 (×4): qty 1

## 2018-09-26 MED ORDER — ENOXAPARIN SODIUM 80 MG/0.8ML ~~LOC~~ SOLN
70.0000 mg | SUBCUTANEOUS | Status: DC
  Administered 2018-09-26 – 2018-09-27 (×2): 70 mg via SUBCUTANEOUS
  Filled 2018-09-26: qty 0.7
  Filled 2018-09-26 (×2): qty 0.8

## 2018-09-26 MED ORDER — NITROGLYCERIN 0.4 MG SL SUBL
0.4000 mg | SUBLINGUAL_TABLET | SUBLINGUAL | Status: DC | PRN
  Administered 2018-09-26: 0.4 mg via SUBLINGUAL
  Filled 2018-09-26: qty 1

## 2018-09-26 MED ORDER — ACETAMINOPHEN 650 MG RE SUPP: 650.0000 mg | Freq: Four times a day (QID) | RECTAL | Status: DC | PRN

## 2018-09-26 MED ORDER — ALBUTEROL SULFATE (2.5 MG/3ML) 0.083% IN NEBU: 3.0000 mL | INHALATION_SOLUTION | RESPIRATORY_TRACT | Status: DC | PRN

## 2018-09-26 MED ORDER — POTASSIUM CHLORIDE CRYS ER 20 MEQ PO TBCR
40.0000 meq | EXTENDED_RELEASE_TABLET | Freq: Every day | ORAL | Status: DC
  Administered 2018-09-26 – 2018-09-29 (×4): 40 meq via ORAL
  Filled 2018-09-26 (×4): qty 2

## 2018-09-26 MED ORDER — ASPIRIN EC 81 MG PO TBEC
81.0000 mg | DELAYED_RELEASE_TABLET | Freq: Every day | ORAL | Status: DC
  Administered 2018-09-27 – 2018-09-29 (×3): 81 mg via ORAL
  Filled 2018-09-26 (×3): qty 1

## 2018-09-26 MED ORDER — SODIUM CHLORIDE 0.9% FLUSH
3.0000 mL | Freq: Two times a day (BID) | INTRAVENOUS | Status: DC
  Administered 2018-09-26 – 2018-09-29 (×7): 3 mL via INTRAVENOUS

## 2018-09-26 MED ORDER — ACETAMINOPHEN 325 MG PO TABS
650.0000 mg | ORAL_TABLET | Freq: Four times a day (QID) | ORAL | Status: DC | PRN
  Administered 2018-09-27 – 2018-09-29 (×3): 650 mg via ORAL
  Filled 2018-09-26 (×3): qty 2

## 2018-09-26 MED ORDER — ENALAPRIL MALEATE 20 MG PO TABS
30.0000 mg | ORAL_TABLET | Freq: Once | ORAL | Status: AC
  Administered 2018-09-26: 30 mg via ORAL
  Filled 2018-09-26: qty 1

## 2018-09-26 MED ORDER — POLYETHYLENE GLYCOL 3350 17 G PO PACK: 17.0000 g | PACK | Freq: Every day | ORAL | Status: DC | PRN
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
## 2018-09-26 NOTE — ED Provider Notes (Signed)
Blood pressure (!) 141/67, pulse 64, temperature 98.2 F (36.8 C), temperature source Oral, resp. rate 14, height 5\' 11"  (1.803 m), weight (!) 145.2 kg, SpO2 96 %.  Assuming care from Dr. Roxanne Mins.  In short, Mubashir Mallek is a 58 y.o. male with a chief complaint of Chest Pain .  Refer to the original H&P for additional details.  The current plan of care is to follow up on labs and imaging. Patient with history of CAD and CP this AM relieved.  07:45 AM  Chest pain has completely resolved at this time after nitroglycerin.  I reviewed the patient's cardiology office note from approximately 1 month ago.  At that time the patient's atypical chest pain was addressed but noted that if symptoms return they would consider nuclear stress test.  Given patient's risk profile and symptoms plan for observational admission.  Troponin is 21.  Chest x-ray shows mild edema which correlates with the patient's lower extremity swelling.  Not particularly short of breath or hypoxic at this time but may also benefit from diuresis.  Leg wound was debrided yesterday and is well-appearing overall. No fever.    EKG Interpretation  Date/Time:  Tuesday September 26 2018 08:02:23 EDT Ventricular Rate:  67 PR Interval:    QRS Duration: 133 QT Interval:  412 QTC Calculation: 435 R Axis:   -53 Text Interpretation:  Sinus rhythm Borderline prolonged PR interval LVH with IVCD, LAD and secondary repol abnrm Anterior ST elevation, probably due to LVH When compared with ECG of 08/17/2018, No significant change was found Confirmed by Delora Fuel ([id number]) on 09/26/2018 6:31:21 AM       Discussed patient's case with Medicine service to request admission. Patient and family (if present) updated with plan. Care transferred to Medicine service.  I reviewed all nursing notes, vitals, pertinent old records, EKGs, labs, imaging (as available).    Margette Fast, MD 09/26/18 [phone]

## 2018-09-26 NOTE — ED Notes (Signed)
Up to br several times, lab  Into draw blood
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
## 2018-09-26 NOTE — ED Notes (Signed)
Report given to Shirlean Mylar, RN on floor

## 2018-09-26 NOTE — ED Triage Notes (Signed)
PT BIB [facility] from home after developed sudden 7/10 chest pressure associated with SOB. Pt with significant cardiac history. PTA a dose of aspirin taken pt unsure of amount and 1 nitro given by ems. Since then pain has subsided with the patient stating a 3/10 pressure. RA, afebrile.
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
## 2018-09-27 ENCOUNTER — Observation Stay (HOSPITAL_COMMUNITY): Payer: Medicaid Other

## 2018-09-27 ENCOUNTER — Encounter (HOSPITAL_COMMUNITY): Payer: Self-pay | Admitting: General Practice

## 2018-09-27 DIAGNOSIS — I10 Essential (primary) hypertension: Secondary | ICD-10-CM

## 2018-09-27 DIAGNOSIS — I5032 Chronic diastolic (congestive) heart failure: Secondary | ICD-10-CM

## 2018-09-27 DIAGNOSIS — Z7982 Long term (current) use of aspirin: Secondary | ICD-10-CM | POA: Diagnosis not present

## 2018-09-27 DIAGNOSIS — R6889 Other general symptoms and signs: Secondary | ICD-10-CM

## 2018-09-27 DIAGNOSIS — Z79899 Other long term (current) drug therapy: Secondary | ICD-10-CM | POA: Diagnosis not present

## 2018-09-27 DIAGNOSIS — G4733 Obstructive sleep apnea (adult) (pediatric): Secondary | ICD-10-CM | POA: Diagnosis not present

## 2018-09-27 DIAGNOSIS — B356 Tinea cruris: Secondary | ICD-10-CM | POA: Diagnosis not present

## 2018-09-27 DIAGNOSIS — S81802A Unspecified open wound, left lower leg, initial encounter: Secondary | ICD-10-CM | POA: Diagnosis not present

## 2018-09-27 DIAGNOSIS — E785 Hyperlipidemia, unspecified: Secondary | ICD-10-CM | POA: Diagnosis not present

## 2018-09-27 DIAGNOSIS — I110 Hypertensive heart disease with heart failure: Secondary | ICD-10-CM | POA: Diagnosis not present

## 2018-09-27 DIAGNOSIS — X58XXXA Exposure to other specified factors, initial encounter: Secondary | ICD-10-CM | POA: Diagnosis not present

## 2018-09-27 DIAGNOSIS — G609 Hereditary and idiopathic neuropathy, unspecified: Secondary | ICD-10-CM

## 2018-09-27 DIAGNOSIS — R079 Chest pain, unspecified: Secondary | ICD-10-CM | POA: Diagnosis not present

## 2018-09-27 DIAGNOSIS — I25118 Atherosclerotic heart disease of native coronary artery with other forms of angina pectoris: Secondary | ICD-10-CM | POA: Diagnosis not present

## 2018-09-27 DIAGNOSIS — R002 Palpitations: Secondary | ICD-10-CM

## 2018-09-27 LAB — MAGNESIUM: Magnesium: 1.9 mg/dL (ref 1.7–2.4)

## 2018-09-27 LAB — URINE DRUGS OF ABUSE SCREEN W ALC, ROUTINE (REF LAB)
Amphetamines, Urine: NEGATIVE ng/mL
Barbiturate, Ur: NEGATIVE ng/mL
Benzodiazepine Quant, Ur: NEGATIVE ng/mL
Cannabinoid Quant, Ur: NEGATIVE ng/mL
Cocaine (Metab.): NEGATIVE ng/mL
Ethanol U, Quan: NEGATIVE %
Methadone Screen, Urine: NEGATIVE ng/mL
Opiate Quant, Ur: NEGATIVE ng/mL
Phencyclidine, Ur: NEGATIVE ng/mL
Propoxyphene, Urine: NEGATIVE ng/mL

## 2018-09-27 LAB — BASIC METABOLIC PANEL
Anion gap: 9 (ref 5–15)
BUN: 8 mg/dL (ref 6–20)
CO2: 27 mmol/L (ref 22–32)
Calcium: 8.8 mg/dL — ABNORMAL LOW (ref 8.9–10.3)
Chloride: 105 mmol/L (ref 98–111)
Creatinine, Ser: 0.87 mg/dL (ref 0.61–1.24)
GFR calc Af Amer: >60 mL/min (ref >60)
GFR calc non Af Amer: >60 mL/min (ref >60)
Glucose, Bld: 106 mg/dL — ABNORMAL HIGH (ref 70–99)
Potassium: 3.7 mmol/L (ref 3.5–5.1)
Sodium: 141 mmol/L (ref 135–145)

## 2018-09-27 LAB — FOLATE RBC
Folate, Hemolysate: 529 ng/mL
Folate, RBC: 1396 ng/mL (ref >498)
Hematocrit: 37.9 % (ref 37.5–51.0)

## 2018-09-27 LAB — BRAIN NATRIURETIC PEPTIDE: B Natriuretic Peptide: 71.1 pg/mL (ref 0.0–100.0)

## 2018-09-27 LAB — TROPONIN I (HIGH SENSITIVITY): Troponin I (High Sensitivity): 23 ng/L — ABNORMAL HIGH (ref <18)

## 2018-09-27 MED ORDER — TECHNETIUM TC 99M TETROFOSMIN IV KIT
30.0000 | PACK | Freq: Once | INTRAVENOUS | Status: AC | PRN
  Administered 2018-09-27: 30 via INTRAVENOUS

## 2018-09-27 MED ORDER — DIAZEPAM 5 MG PO TABS
5.0000 mg | ORAL_TABLET | Freq: Once | ORAL | Status: AC | PRN
  Administered 2018-09-28: 5 mg via ORAL
  Filled 2018-09-27: qty 1

## 2018-09-27 MED ORDER — VALACYCLOVIR HCL 500 MG PO TABS
500.0000 mg | ORAL_TABLET | Freq: Every day | ORAL | Status: DC
  Administered 2018-09-27 – 2018-09-29 (×3): 500 mg via ORAL
  Filled 2018-09-27 (×3): qty 1

## 2018-09-27 MED ORDER — REGADENOSON 0.4 MG/5ML IV SOLN
INTRAVENOUS | Status: AC
  Administered 2018-09-27: 0.4 mg via INTRAVENOUS
  Filled 2018-09-27: qty 5

## 2018-09-27 MED ORDER — REGADENOSON 0.4 MG/5ML IV SOLN
0.4000 mg | Freq: Once | INTRAVENOUS | Status: AC
  Administered 2018-09-27: 11:00:00 0.4 mg via INTRAVENOUS
  Filled 2018-09-27: qty 5

## 2018-09-27 NOTE — Progress Notes (Addendum)
   Russell Collier presented for a lexiscan cardiolite today.  No immediate complications.  Stress imaging is pending at this time.    Day 1 of 2 day study.  Reino Bellis, NP 09/27/2018, 11:17 AM

## 2018-09-27 NOTE — Progress Notes (Signed)
Pt came down for MRI cardiac this morning.  Pt was claustrophobic and could not tolerate exam.  Exam not completed.

## 2018-09-27 NOTE — Consult Note (Signed)
[facility] Nurse wound consult note Reason for Consult: Left LE three-layer compression bandaging system. Applied on Monday, 09/25/18 at outpatient [facility] and it is clean, dry and intact at this time. Patient has appointment tomorrow (on Thursday) for a routine change. This Probation officer called the outpatient [facility] [phone] and spoke to Deon Pilling, Therapist, sports. She states that patient recently went 1 week between changes as his [facility] has not yet been established due to payer [place]) complications. Requests that this writer remind patient to return to the [facility] on Monday, August 10 at 2:30pm for next dressing change.  Patient reports that he is unhappy with the pain he experienced with wound debridement on Monday and wishes to try other forms of debridement, such as larval therapy. He is researching these methods and plans to discuss with his physician during the next appointment. He understands that the [facility] does not offer this form of therapy, he wants to know where he can get it.This is relayed to Ms. Deaton.  [facility] nursing team will follow for 3-layer dressing change should patient still be in the hospital on Monday, and will remain available to this patient, the nursing and medical teams.    Thanks, Maudie Flakes, MSN, RN, [place], Arther Abbott  Pager# [phone]

## 2018-09-27 NOTE — Progress Notes (Addendum)
   Subjective: Patient was seen at bedside and is doing well. His chest pain has resolved. He states that this pain is similar to prior episodes. Does not have SHOB today but feels that he gets [facility], anxious, and that causes his chest pain. Cardiology has seen him and informed him that he would be having a NM stress test today. Discussed that we will wait for the stress test and depending on the results determine whether further evaluation is required. All questions and concerns were answered.   In regard to his wound. It has been present since April. He is following with wound care. They are doing periodic debridements. He currently has his wound wrapped.   Objective: Vital signs in last 24 hours: Vitals:   09/26/18 1326 09/26/18 1335 09/26/18 2312 09/27/18 0707  BP: (!) 154/80  (!) 160/79 (!) 169/97  Pulse: 61  65 68  Resp: (!) 21  16 17   Temp: 97.9 F (36.6 C)  98.1 F (36.7 C) 97.9 F (36.6 C)  TempSrc: Oral  Oral Oral  SpO2: 99%  100% 99%  Weight: (!) 145.8 kg (!) 147 kg    Height: 5\' 11"  (1.803 m) 5\' 11"  (1.803 m)     Physical Exam Constitutional:      General: He is not in acute distress.    Appearance: He is obese. He is not ill-appearing, toxic-appearing or diaphoretic.  Cardiovascular:     Rate and Rhythm: Normal rate and regular rhythm.     Heart sounds: Normal heart sounds. Heart sounds not distant. No murmur. No systolic murmur. No diastolic murmur.  Pulmonary:     Effort: Pulmonary effort is normal. No tachypnea, accessory muscle usage or respiratory distress.     Breath sounds: No stridor. No decreased breath sounds, wheezing, rhonchi or rales.  Chest:     Chest wall: No mass, deformity, tenderness or crepitus.  Abdominal:     Palpations: Abdomen is soft.  Neurological:     Mental Status: He is alert.     Assessment/Plan:  Active Problems:   Chest pain, rule out acute myocardial infarction  Chest Pain Rule Out MI:  - troponins stabilized 23<-23<-21 - Will  be undergoing Myoview today by Cardiology. We appreciate their help.  - Myoview Pending - Nitrostat PRN - BNP 71.1  Hypertension:  - Vasotec 40 mg  LLE wound: Following with [facility] wound care, bandage currently intact, WOC nurse consulted.  Dispo: Anticipated depending on medical course.   Sonnie Alamo MD 09/27/2018, 7:49 AM  Pager: [phone]

## 2018-09-28 ENCOUNTER — Observation Stay (HOSPITAL_COMMUNITY): Payer: Medicaid Other

## 2018-09-28 DIAGNOSIS — F419 Anxiety disorder, unspecified: Secondary | ICD-10-CM | POA: Diagnosis present

## 2018-09-28 DIAGNOSIS — L97221 Non-pressure chronic ulcer of left calf limited to breakdown of skin: Secondary | ICD-10-CM

## 2018-09-28 DIAGNOSIS — G609 Hereditary and idiopathic neuropathy, unspecified: Secondary | ICD-10-CM | POA: Diagnosis not present

## 2018-09-28 DIAGNOSIS — R079 Chest pain, unspecified: Secondary | ICD-10-CM | POA: Diagnosis not present

## 2018-09-28 DIAGNOSIS — T445X6A Underdosing of predominantly beta-adrenoreceptor agonists, initial encounter: Secondary | ICD-10-CM | POA: Diagnosis present

## 2018-09-28 DIAGNOSIS — Y92009 Unspecified place in unspecified non-institutional (private) residence as the place of occurrence of the external cause: Secondary | ICD-10-CM | POA: Diagnosis not present

## 2018-09-28 DIAGNOSIS — I2510 Atherosclerotic heart disease of native coronary artery without angina pectoris: Secondary | ICD-10-CM

## 2018-09-28 DIAGNOSIS — I25118 Atherosclerotic heart disease of native coronary artery with other forms of angina pectoris: Secondary | ICD-10-CM | POA: Diagnosis not present

## 2018-09-28 DIAGNOSIS — I2583 Coronary atherosclerosis due to lipid rich plaque: Secondary | ICD-10-CM | POA: Diagnosis not present

## 2018-09-28 DIAGNOSIS — R2681 Unsteadiness on feet: Secondary | ICD-10-CM | POA: Diagnosis present

## 2018-09-28 DIAGNOSIS — M1990 Unspecified osteoarthritis, unspecified site: Secondary | ICD-10-CM | POA: Diagnosis present

## 2018-09-28 DIAGNOSIS — I83022 Varicose veins of left lower extremity with ulcer of calf: Secondary | ICD-10-CM

## 2018-09-28 DIAGNOSIS — X58XXXA Exposure to other specified factors, initial encounter: Secondary | ICD-10-CM | POA: Diagnosis not present

## 2018-09-28 DIAGNOSIS — Z86711 Personal history of pulmonary embolism: Secondary | ICD-10-CM | POA: Diagnosis not present

## 2018-09-28 DIAGNOSIS — R7989 Other specified abnormal findings of blood chemistry: Secondary | ICD-10-CM | POA: Diagnosis not present

## 2018-09-28 DIAGNOSIS — S81802A Unspecified open wound, left lower leg, initial encounter: Secondary | ICD-10-CM | POA: Diagnosis not present

## 2018-09-28 DIAGNOSIS — B356 Tinea cruris: Secondary | ICD-10-CM

## 2018-09-28 DIAGNOSIS — R0602 Shortness of breath: Secondary | ICD-10-CM | POA: Diagnosis not present

## 2018-09-28 DIAGNOSIS — Z20828 Contact with and (suspected) exposure to other viral communicable diseases: Secondary | ICD-10-CM | POA: Diagnosis present

## 2018-09-28 DIAGNOSIS — I872 Venous insufficiency (chronic) (peripheral): Secondary | ICD-10-CM | POA: Diagnosis present

## 2018-09-28 DIAGNOSIS — I5032 Chronic diastolic (congestive) heart failure: Secondary | ICD-10-CM | POA: Diagnosis not present

## 2018-09-28 DIAGNOSIS — E785 Hyperlipidemia, unspecified: Secondary | ICD-10-CM | POA: Diagnosis not present

## 2018-09-28 DIAGNOSIS — I5033 Acute on chronic diastolic (congestive) heart failure: Secondary | ICD-10-CM | POA: Diagnosis not present

## 2018-09-28 DIAGNOSIS — E876 Hypokalemia: Secondary | ICD-10-CM | POA: Diagnosis present

## 2018-09-28 DIAGNOSIS — Z86718 Personal history of other venous thrombosis and embolism: Secondary | ICD-10-CM | POA: Diagnosis not present

## 2018-09-28 DIAGNOSIS — I110 Hypertensive heart disease with heart failure: Secondary | ICD-10-CM | POA: Diagnosis not present

## 2018-09-28 DIAGNOSIS — R072 Precordial pain: Secondary | ICD-10-CM | POA: Diagnosis not present

## 2018-09-28 DIAGNOSIS — R002 Palpitations: Secondary | ICD-10-CM | POA: Diagnosis not present

## 2018-09-28 DIAGNOSIS — F17210 Nicotine dependence, cigarettes, uncomplicated: Secondary | ICD-10-CM | POA: Diagnosis present

## 2018-09-28 DIAGNOSIS — Z79899 Other long term (current) drug therapy: Secondary | ICD-10-CM | POA: Diagnosis not present

## 2018-09-28 DIAGNOSIS — G4733 Obstructive sleep apnea (adult) (pediatric): Secondary | ICD-10-CM | POA: Diagnosis not present

## 2018-09-28 DIAGNOSIS — F40240 Claustrophobia: Secondary | ICD-10-CM | POA: Diagnosis present

## 2018-09-28 DIAGNOSIS — I10 Essential (primary) hypertension: Secondary | ICD-10-CM | POA: Diagnosis not present

## 2018-09-28 DIAGNOSIS — Z8619 Personal history of other infectious and parasitic diseases: Secondary | ICD-10-CM | POA: Diagnosis not present

## 2018-09-28 DIAGNOSIS — R42 Dizziness and giddiness: Secondary | ICD-10-CM | POA: Diagnosis present

## 2018-09-28 DIAGNOSIS — Z9114 Patient's other noncompliance with medication regimen: Secondary | ICD-10-CM | POA: Diagnosis not present

## 2018-09-28 DIAGNOSIS — Z7982 Long term (current) use of aspirin: Secondary | ICD-10-CM | POA: Diagnosis not present

## 2018-09-28 LAB — GLUCOSE, CAPILLARY: Glucose-Capillary: 99 mg/dL (ref 70–99)

## 2018-09-28 LAB — BASIC METABOLIC PANEL
Anion gap: 10 (ref 5–15)
BUN: 10 mg/dL (ref 6–20)
CO2: 26 mmol/L (ref 22–32)
Calcium: 8.7 mg/dL — ABNORMAL LOW (ref 8.9–10.3)
Chloride: 101 mmol/L (ref 98–111)
Creatinine, Ser: 0.75 mg/dL (ref 0.61–1.24)
GFR calc Af Amer: >60 mL/min (ref >60)
GFR calc non Af Amer: >60 mL/min (ref >60)
Glucose, Bld: 109 mg/dL — ABNORMAL HIGH (ref 70–99)
Potassium: 4 mmol/L (ref 3.5–5.1)
Sodium: 137 mmol/L (ref 135–145)

## 2018-09-28 LAB — NM MYOCAR MULTI W/SPECT W/WALL MOTION / EF
Estimated workload: 1 METS
Exercise duration (min): 5 min
MPHR: 160 {beats}/min
Peak HR: 96 {beats}/min
Percent HR: 60 %
Rest HR: 53 {beats}/min

## 2018-09-28 MED ORDER — TECHNETIUM TC 99M TETROFOSMIN IV KIT
30.0000 | PACK | Freq: Once | INTRAVENOUS | Status: AC | PRN
  Administered 2018-09-28: 30 via INTRAVENOUS

## 2018-09-28 MED ORDER — CLOTRIMAZOLE 1 % EX CREA
TOPICAL_CREAM | Freq: Two times a day (BID) | CUTANEOUS | Status: DC
  Administered 2018-09-28 – 2018-09-29 (×3): via TOPICAL
  Filled 2018-09-28: qty 15

## 2018-09-28 MED ORDER — FUROSEMIDE 10 MG/ML IJ SOLN
40.0000 mg | Freq: Once | INTRAMUSCULAR | Status: AC
  Administered 2018-09-28: 40 mg via INTRAVENOUS
  Filled 2018-09-28: qty 4

## 2018-09-28 MED ORDER — ISOSORBIDE MONONITRATE ER 30 MG PO TB24
30.0000 mg | ORAL_TABLET | Freq: Every day | ORAL | Status: DC
  Administered 2018-09-28 – 2018-09-29 (×2): 30 mg via ORAL
  Filled 2018-09-28 (×2): qty 1
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
## 2018-09-28 NOTE — Progress Notes (Addendum)
   Subjective: Feeling well this AM. He is not having any chest pain. Cardiology has been by today. Plan is for day 2 of his NM stress test. LE wounds are doing well. Wound care came by yesterday and addressed his concerns. No worsening pain or fevers, and his left leg is doing well. Discussed plan to await results of stress test and then further determine plan of care. Also discussed anti-anxiety measures for his repeat MRI for today. All question and concerns were addressed.   Objective: Vital signs in last 24 hours: Vitals:   09/27/18 1109 09/27/18 1111 09/27/18 2020 09/28/18 0355  BP: (!) 167/104 (!) 171/99 (!) 175/85 (!) 165/84  Pulse: 82 72 62 62  Resp:   16 18  Temp:   98.5 F (36.9 C) (!) 97.5 F (36.4 C)  TempSrc:   Oral Oral  SpO2:   100% 98%  Weight:    (!) 147 kg  Height:       Physical Exam Vitals signs and nursing note reviewed.  Constitutional:      General: He is not in acute distress.    Appearance: He is obese. He is not ill-appearing, toxic-appearing or diaphoretic.  HENT:     Head: Normocephalic and atraumatic.  Cardiovascular:     Rate and Rhythm: Normal rate and regular rhythm.     Heart sounds: Normal heart sounds. Heart sounds not distant. No murmur. No friction rub. No gallop.   Pulmonary:     Effort: Pulmonary effort is normal. No tachypnea.     Breath sounds: Normal breath sounds. No stridor.  Chest:     Chest wall: No mass or deformity.  Abdominal:     General: Bowel sounds are normal.     Palpations: Abdomen is soft.     Tenderness: There is no abdominal tenderness.  Neurological:     Mental Status: He is alert.     Assessment/Plan:  Principal Problem:   Chest pain, rule out acute myocardial infarction Active Problems:   Idiopathic peripheral neuropathy   Palpitations   Chronic diastolic heart failure (HCC)   Abnormal ankle brachial index (ABI)  Chest Pain Rule Out MI:  - troponins stabilized 23<-23<-21(09/27/2018).  - Myoview Pending  day 2/2 - MRI Cardiac morphology repeat with anxiety countermeasures - Nitrostat PRN - BNP 71.1 (09/27/2018)  Hypertension:  - Vasotec 40 mg  LLE wound: -Following with [facility] wound care, bandage currently intact - Wound Care will change dressing Monday if patient is still here  Tinea Cruris - Lotrimin 1% cream BID.   Dispo: Anticipated depending on medical course.     Maudie Mercury, MD 09/28/2018, 6:25 AM Pager: [phone]

## 2018-09-28 NOTE — Progress Notes (Signed)
Pt came down for second attempt at Cardiac MRI, this time he was given PO Valium.  Pt was in the scanner for only moments and HR was elevated, pt very anxious and said he wanted to be let out.  Due to the fact that he is a heart patient and I did not want to cause him further stress, I calmed him down and he said he did not want to do the exam at all, he just wanted to go home.  Pt sent back to floor, exam not done.

## 2018-09-28 NOTE — Progress Notes (Signed)
Per d/w Dr. Radford Pax this AM, we recommended 1 dose of IV Lasix which was ordered given wheezing and increasing edema.  Nuc was low risk with small fixed nonreversible defect but otherwise normal perfusion. Await cMRI results and follow response to IV Lasix. F/u BMET AM.  Melina Copa PA-C

## 2018-09-28 NOTE — Progress Notes (Signed)
Called MRI department about cardiac MRI not getting completed yesterday a need for pretreatment for retry today. Gave name and number for call when they can get him down stairs for testing so I can pre treat with medication to help claustrophobia.  Saddie Benders RN BSN

## 2018-09-28 NOTE — Progress Notes (Addendum)
Progress Note  Patient Name: Russell Collier Date of Encounter: 09/28/2018  Primary Cardiologist: Fransico Him, MD  Subjective   Still with intermittent fleeting chest pains. He felt better up and walking the halls. Plan for re-trial of cardiac MRI. Right leg seems a little more swollen today.  Inpatient Medications    Scheduled Meds:  aspirin EC  81 mg Oral Daily   enalapril  40 mg Oral Daily   enoxaparin (LOVENOX) injection  70 mg Subcutaneous Q24H   pantoprazole  40 mg Oral Q0600   potassium chloride  40 mEq Oral Daily   sodium chloride flush  3 mL Intravenous Q12H   valACYclovir  500 mg Oral Daily   Continuous Infusions:  PRN Meds: acetaminophen **OR** acetaminophen, albuterol, diazepam, nitroGLYCERIN, polyethylene glycol   Vital Signs    Vitals:   09/27/18 1109 09/27/18 1111 09/27/18 2020 09/28/18 0355  BP: (!) 167/104 (!) 171/99 (!) 175/85 (!) 165/84  Pulse: 82 72 62 62  Resp:   16 18  Temp:   98.5 F (36.9 C) (!) 97.5 F (36.4 C)  TempSrc:   Oral Oral  SpO2:   100% 98%  Weight:    (!) 147 kg  Height:        Intake/Output Summary (Last 24 hours) at 09/28/2018 0810 Last data filed at 09/28/2018 0400 Gross per 24 hour  Intake 765 ml  Output 975 ml  Net -210 ml   Last 3 Weights 09/28/2018 09/26/2018 09/26/2018  Weight (lbs) 324 lb 1.2 oz 324 lb 321 lb 6.9 oz  Weight (kg) 147 kg 146.965 kg 145.8 kg     Telemetry    NSR - Personally Reviewed  Physical Exam   GEN: Morbidly obese AAM, NAD.  HEENT: Normocephalic, atraumatic, sclera non-icteric. Neck: No JVD or bruits. Cardiac: RRR no murmurs, rubs, or gallops.  Radials/DP/PT 1+ and equal bilaterally.  Respiratory: Diffusely diminished with end expiratory wheezing. Breathing is unlabored. GI: Soft, nontender, non-distended, BS +x 4. MS: no deformity. Extremities: No clubbing or cyanosis. Trace-1+ RLE edema, 1-2+ LLE edema with wrapped extremity.  Neuro:  AAOx3. Follows commands. Psych:  Responds to  questions appropriately with a normal affect.  Labs    High Sensitivity Troponin:   Recent Labs  Lab 09/26/18 0648 09/26/18 0840 09/27/18 0538  TROPONINIHS 21* 23* 23*      Cardiac EnzymesNo results for input(s): TROPONINI in the last 168 hours. No results for input(s): TROPIPOC in the last 168 hours.   Chemistry Recent Labs  Lab 09/26/18 0648 09/27/18 0007 09/28/18 0400  NA 141 141 137  K 3.4* 3.7 4.0  CL 105 105 101  CO2 27 27 26   GLUCOSE 109* 106* 109*  BUN 5* 8 10  CREATININE 0.83 0.87 0.75  CALCIUM 8.8* 8.8* 8.7*  GFRNONAA >60 >60 >60  GFRAA >60 >60 >60  ANIONGAP 9 9 10      Hematology Recent Labs  Lab 09/26/18 0648 09/26/18 1637  WBC 7.2  --   RBC 4.37  --   HGB 12.9*  --   HCT 40.1 37.9  MCV 91.8  --   MCH 29.5  --   MCHC 32.2  --   RDW 15.2  --   PLT 246  --     BNP Recent Labs  Lab 09/26/18 1433 09/27/18 0007  BNP 67.4 71.1     DDimer No results for input(s): DDIMER in the last 168 hours.   Radiology    Dg Chest 2 View  Result Date: 09/26/2018 CLINICAL DATA:  Shortness of breath and left-sided chest discomfort. EXAM: CHEST - 2 VIEW COMPARISON:  Earlier same day FINDINGS: Heart size upper limits of normal. Mild aortic atherosclerosis. The lungs are clear. No infiltrate, collapse or effusion. No acute bone finding. IMPRESSION: No active cardiopulmonary disease. Electronically Signed   By: Nelson Chimes M.D.   On: 09/26/2018 16:02    Cardiac Studies   Pending nuc this admission  Patient Profile     58 y.o. 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 He has had tumultuous course lately outlined in complex consult note with bradycardia requiring cessation of beta blocker. He has also had persistent dizziness/gait unsteadiness and inability to tolerate many cardiac meds all at once so  stopped them earlier this summer. He also has developed a significant nonhealing wound on his left shin, with evidence of PAD on noninvasive testing. He presented specifically with symptoms of chest pain.  Assessment & Plan    1. Chest pain with gray zone troponin and known CAD - unclear if this is related to blood pressure, moderate residual CAD, or alternative process. Symptoms wax/wane without clear trigger, are somewhat atypical and also associated with dyspnea as well. He has been resistant to prior anti-anginal titration as well as statin due to feeling "overmedicated." He is hesitant to revisit readdition of more medication. 2-day nuc result pending today to see if there is any localized ischemia.  2. Essential HTN - primary team managing.  3. Chronic diastolic CHF - PA/Lat film without any volume and BNP wnl. Volume status challenging due to body habitus - he does seem to have some more edema on exam and wheezing than on 8/4 so may consider trial of diuretic, perhaps spironolactone which would help with the potassium issue. (BNP was normal in 01/2018 when LVEDP was 24 and required IV diuresis -> did not go home on any diuretics at that time.) It concerns me that he has had historical inability to titrate cardiac medicines, symptoms of bilateral carpal tunnel, peripheral neuropathy, LVH, and prior conduction abnormalities. He sometimes states he cannot feel his feet touching the ground. Would be concerned that this could be early representation of infiltrative process such as amyloid. B12/folate normal. For re-try at cardiac MRI today.   4. H/o bradycardia, as well as palpitations - f/u event monitor unrevealing for acute etiology - only nocturnal bradycardia noted associated with OSA. Avoid AVN blocking agents. Dizziness does not appear to be associated with any arrhythmia, suggesting peripheral cause.  5. Chronic dizziness/gait unsteadiness - this has been getting worse over the last year.  The patient now has to use scooter due to feeling unsteady. Cardiac workup has been unrevealing for cause. I had recommended outpatient CT of the head but it looks like he canceled due to financial reasons. Appreciate IM input on further neurologic evaluation. Would recommend PT eval later in this admission.  5. Hypokalemia - this has been an ongoing problem intermittently for unclear reasons. Consider secondary medical workup given persistent hypokalemia without obvious etiology. Start KCl 1meq daily. If K comes up on this dose above 4, can consider discharging on 38meq.  6. Left lower extremity wound - approx 3 inches per patient (the picture he shares from his phone is striking). Reports poor healing despite multiple antibiotics and having difficulty tolerating wound debridement due to significant pain. Also has  significant left lower extremity soft tissue swelling associated with this. PV eval pending today; will defer further management to primary team as this remains a major crux of anxiety for the patient. Given his LLE edema and CP/SOB with prior hx of DVT will defer to primary team whether they want to pursue w/u for VTE.  For questions or updates, please contact [place] Please consult [email or web address] for contact info under Cardiology/STEMI.  Signed, Charlie Pitter, PA-C 09/28/2018, 8:10 AM

## 2018-09-28 NOTE — Progress Notes (Signed)
Patient complaining of "jock itch".  Per patient has tried powder and did not work, requesting cream.  [facility] paged with this information.

## 2018-09-29 ENCOUNTER — Other Ambulatory Visit: Payer: Self-pay | Admitting: Internal Medicine

## 2018-09-29 ENCOUNTER — Other Ambulatory Visit: Payer: Self-pay | Admitting: Cardiology

## 2018-09-29 DIAGNOSIS — Z7982 Long term (current) use of aspirin: Secondary | ICD-10-CM

## 2018-09-29 DIAGNOSIS — I25118 Atherosclerotic heart disease of native coronary artery with other forms of angina pectoris: Principal | ICD-10-CM

## 2018-09-29 DIAGNOSIS — I5030 Unspecified diastolic (congestive) heart failure: Secondary | ICD-10-CM

## 2018-09-29 DIAGNOSIS — I5033 Acute on chronic diastolic (congestive) heart failure: Secondary | ICD-10-CM

## 2018-09-29 LAB — BASIC METABOLIC PANEL
Anion gap: 9 (ref 5–15)
BUN: 12 mg/dL (ref 6–20)
CO2: 28 mmol/L (ref 22–32)
Calcium: 8.7 mg/dL — ABNORMAL LOW (ref 8.9–10.3)
Chloride: 100 mmol/L (ref 98–111)
Creatinine, Ser: 0.91 mg/dL (ref 0.61–1.24)
GFR calc Af Amer: >60 mL/min (ref >60)
GFR calc non Af Amer: >60 mL/min (ref >60)
Glucose, Bld: 108 mg/dL — ABNORMAL HIGH (ref 70–99)
Potassium: 4 mmol/L (ref 3.5–5.1)
Sodium: 137 mmol/L (ref 135–145)

## 2018-09-29 MED ORDER — ISOSORBIDE MONONITRATE ER 30 MG PO TB24: 30.0000 mg | ORAL_TABLET | Freq: Every day | ORAL | 0 refills | Status: DC

## 2018-09-29 MED ORDER — FUROSEMIDE 40 MG PO TABS: 40.0000 mg | ORAL_TABLET | Freq: Every day | ORAL | Status: DC

## 2018-09-29 MED ORDER — ATORVASTATIN CALCIUM 80 MG PO TABS: 80.0000 mg | ORAL_TABLET | Freq: Every day | ORAL | 11 refills | Status: DC

## 2018-09-29 MED ORDER — ENALAPRIL MALEATE 20 MG PO TABS: 40.0000 mg | ORAL_TABLET | Freq: Every day | ORAL | 11 refills | Status: DC

## 2018-09-29 MED ORDER — FUROSEMIDE 40 MG PO TABS: 40.0000 mg | ORAL_TABLET | Freq: Every day | ORAL | 0 refills | Status: DC

## 2018-09-29 MED ORDER — FUROSEMIDE 10 MG/ML IJ SOLN: 40.0000 mg | Freq: Once | INTRAMUSCULAR | Status: DC

## 2018-09-29 NOTE — Progress Notes (Signed)
Explained to patient the outpatient 24 hr urine collection. Patient voiced that he will not be compliant once he is out the hospital and that "sitting around the house collecting urine doesn't fit with his day". Will still send pt home with collection equipment and page ordering MD with information.

## 2018-09-29 NOTE — Discharge Summary (Addendum)
Name: Russell Collier MRN: [id number] DOB: 1960/06/13 58 y.o. PCP: [facility]  Date of Admission: 09/26/2018  6:20 AM Date of Discharge:   Attending Physician: Lucious Groves, DO  Discharge Diagnosis: 1. Chest Pain  Discharge Medications: Allergies as of 09/29/2018   No Known Allergies      Medication List     TAKE these medications    albuterol 108 (90 Base) MCG/ACT inhaler Commonly known as: VENTOLIN HFA Inhale 2 puffs into the lungs every 4 (four) hours as needed for wheezing or shortness of breath.   aspirin EC 81 MG tablet Take 1 tablet (81 mg total) by mouth daily. What changed: Another medication with the same name was removed. Continue taking this medication, and follow the directions you see here.   atorvastatin 80 MG tablet Commonly known as: Lipitor Take 1 tablet (80 mg total) by mouth daily.   enalapril 20 MG tablet Commonly known as: VASOTEC Take 2 tablets (40 mg total) by mouth daily. What changed:  how much to take when to take this   enalapril 20 MG tablet Commonly known as: VASOTEC Take 2 tablets (40 mg total) by mouth daily. What changed: You were already taking a medication with the same name, and this prescription was added. Make sure you understand how and when to take each.   furosemide 40 MG tablet Commonly known as: LASIX Take 1 tablet (40 mg total) by mouth daily. Start taking on: September 30, 2018   isosorbide mononitrate 30 MG 24 hr tablet Commonly known as: IMDUR Take 1 tablet (30 mg total) by mouth daily. Start taking on: September 30, 2018   multivitamin capsule Take 1 capsule by mouth daily.   pantoprazole 40 MG tablet Commonly known as: PROTONIX Take 1 tablet (40 mg total) by mouth daily at 6 (six) AM.   valACYclovir 500 MG tablet Commonly known as: VALTREX Take 500 mg by mouth 2 (two) times daily.        Disposition and follow-up:   RussellRussell Collier was discharged from [facility] in Stable  condition.  At the hospital follow up visit please address:  1. 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   2.  Labs / imaging needed at time of follow-up: PYP scan.   3.  Pending labs/ test needing follow-up: UPEP/UIFE/Light Chains/TP, 24 hr Ur  Follow-up Appointments: Follow-up Information     [name], [name], MD Follow up on 10/13/2018.   Specialty: Cardiology Why: at 10 AM with his NP Amy Clegg.  this is in the hospital call before appt for parking instructions.   Contact information: [address] [place] [address] [place], [place]. Call.   Contact information: [place] [address] [phone]         [name], MD .   Specialty: Cardiology Contact information: [phone] [address] [address] [place] Hospital Course by problem list: 1. [name] is a 58 y/o male, with a PMH of HTN, CAD, Chronic diastolic CHF, HLD, and OSA, who was admitted to [facility] with Chest pain.   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 He was admitted for ACS rule out. His troponins were stable during his stay and his BNP was 71.1. He also underwent  a myoview which showed a small non reversible defect of mild severity in the apex, with an ef of 45-54% with a nuclear stress EF of 48%. There was no ST deviation or No T wave inversion. It was not felt that he had flow limiting atherosclerosis. IMDUR was added to his medications.  He will follow up with Cardiology as an outpatient. We did attempt to obtain a Cardiac MRI twice to evaluate for possible cardiac amyloidosis however he was unable to tolerate the test due to claustrophobia,  Cardiology plans to obtain a bone scan outpatient.  Lower Left Extremity Wound:  While Russell Collier has been seeing wound care for the maintenance of his left leg wound, he was  seen by Dr. Gwenlyn Found in the presence of abnormal ABI. Feels this is not arterial and is venous in nature. It was advised that he needs additional compression for his injury.  He was encouraged to continue following up with wound care.    Discharge Vitals:   BP 128/70 (BP Location: Right Arm)   Pulse (!) 49   Temp 97.7 F (36.5 C) (Oral)   Resp 20   Ht 5\' 11"  (1.803 m)   Wt (!) 146.1 kg   SpO2 94%   BMI 44.94 kg/m   Pertinent Labs, Studies, and Procedures:   NM Myocar Multi W/Spect W/Wall Motion / EF: There was no ST segment deviation noted during stress. No T wave inversion was noted during stress. Defect 1: There is a small defect of mild severity present in the apex location. This is a low risk study. The left ventricular ejection fraction is mildly decreased (45-54%). Nuclear stress EF: 48%. Low risk stress nuclear study with a small fixed apical defect, otherwise normal perfusion and mildly reduced left ventricular global systolic function.    Discharge Instructions: Discharge Instructions     (HEART FAILURE PATIENTS) Call MD:  Anytime you have any of the following symptoms: 1) 3 pound weight gain in 24 hours or 5 pounds in 1 week 2) shortness of breath, with or without a dry hacking cough 3) swelling in the hands, feet or stomach 4) if you have to sleep on extra pillows at night in order to breathe.   Complete by: As directed    Call MD for:  difficulty breathing, headache or visual disturbances   Complete by: As directed    Call MD for:  persistant nausea and vomiting   Complete by: As directed    Diet - low sodium heart healthy   Complete by: As directed    Discharge instructions   Complete by: As directed    Thank you for letting us work with you. You were admitted by the Emergency Department for chest pain. You received imaging to see if cardiac catheretization was needed. Your results came back and it was found that intervention was not indicated at this time.   You  will be discharged with additional medications today. Please follow the instructions as indicated on the label.   If you have any new shortness of breath, chest pain, numbness, or any worsening of your condition, please come back to the Emergency Department to be reevaluated.       Signed: Maudie Mercury, MD 09/29/2018, 1:52 PM   Pager: [phone]

## 2018-09-29 NOTE — Discharge Instructions (Signed)

## 2018-09-29 NOTE — Progress Notes (Signed)
   Subjective:  [name] was seen sitting in a chair by his bed this morning. He states that he is feeling better than yesterday. He was happy with his results for the Myoview, but was unable to do his MRI cardiac morphology due to anxiety, even with preventative measures. All questions and concerns were addressed.   Objective:  Vital signs in last 24 hours: Vitals:   09/28/18 0355 09/28/18 1316 09/28/18 2233 09/29/18 0431  BP: (!) 165/84 (!) 153/74 136/79 128/70  Pulse: 62 80 (!) 57 (!) 49  Resp: 18 20    Temp: (!) 97.5 F (36.4 C) 97.8 F (36.6 C) 98.6 F (37 C) 97.7 F (36.5 C)  TempSrc: Oral Oral Oral Oral  SpO2: 98% 95% 96% 94%  Weight: (!) 147 kg   (!) 146.1 kg  Height:         Assessment/Plan:  Principal Problem:   Chest pain, rule out acute myocardial infarction Active Problems:   Idiopathic peripheral neuropathy   Palpitations   Chronic diastolic heart failure (HCC)   Abnormal ankle brachial index (ABI)   Shortness of breath   Precordial chest pain  Chest Pain Rule Out MI:  - troponins stabilized 23<-23<-21(09/27/2018). - Myoview EF 45-54%, small apical defect, no st segment deviation, no t wave inversion.  - MRI Cardiac morphology repeat not tolerated due to anxiety. - Nitrostat PRN - BNP 71.1 (09/27/2018)  Hypertension:  - Vasotec 40 mg  LLE wound: - Following with [facility] wound care, bandage currently intact - Wound Care will change dressing Monday if patient is still here  Tinea Cruris - Lotrimin 1% cream BID.   Dispo: Anticipateddischarge today.     Maudie Mercury, MD 09/29/2018, 6:23 AM Pager: [phone]

## 2018-09-29 NOTE — Progress Notes (Signed)
Progress Note  Patient Name: Russell Collier Date of Encounter: 09/29/2018  Primary Cardiologist: Fransico Him, MD   Subjective   No complaints, walking in hall    Inpatient Medications    Scheduled Meds: . aspirin EC  81 mg Oral Daily  . clotrimazole   Topical BID  . enalapril  40 mg Oral Daily  . enoxaparin (LOVENOX) injection  70 mg Subcutaneous Q24H  . isosorbide mononitrate  30 mg Oral Daily  . pantoprazole  40 mg Oral Q0600  . potassium chloride  40 mEq Oral Daily  . sodium chloride flush  3 mL Intravenous Q12H  . valACYclovir  500 mg Oral Daily   Continuous Infusions:  PRN Meds: acetaminophen **OR** acetaminophen, albuterol, nitroGLYCERIN, polyethylene glycol   Vital Signs    Vitals:   09/28/18 0355 09/28/18 1316 09/28/18 2233 09/29/18 0431  BP: (!) 165/84 (!) 153/74 136/79 128/70  Pulse: 62 80 (!) 57 (!) 49  Resp: 18 20    Temp: (!) 97.5 F (36.4 C) 97.8 F (36.6 C) 98.6 F (37 C) 97.7 F (36.5 C)  TempSrc: Oral Oral Oral Oral  SpO2: 98% 95% 96% 94%  Weight: (!) 147 kg   (!) 146.1 kg  Height:        Intake/Output Summary (Last 24 hours) at 09/29/2018 0743 Last data filed at 09/29/2018 2831 Gross per 24 hour  Intake 3 ml  Output 1350 ml  Net -1347 ml   Last 3 Weights 09/29/2018 09/28/2018 09/26/2018  Weight (lbs) 322 lb 3.2 oz 324 lb 1.2 oz 324 lb  Weight (kg) 146.149 kg 147 kg 146.965 kg      Telemetry    HR to 37 in night and 130 when getting out of bed  - Personally Reviewed  ECG    No new - Personally Reviewed  Physical Exam   GEN: No acute distress.   Neck: No JVD Cardiac: RRR, no murmurs, rubs, or gallops.  Respiratory: Clear to auscultation bilaterally. GI: Soft, nontender, non-distended  MS: No edema; No deformity. Neuro:  Nonfocal  Psych: Normal affect   Labs    High Sensitivity Troponin:   Recent Labs  Lab 09/26/18 0648 09/26/18 0840 09/27/18 0538  TROPONINIHS 21* 23* 23*      Cardiac EnzymesNo results for input(s):  TROPONINI in the last 168 hours. No results for input(s): TROPIPOC in the last 168 hours.   Chemistry Recent Labs  Lab 09/27/18 0007 09/28/18 0400 09/29/18 0403  NA 141 137 137  K 3.7 4.0 4.0  CL 105 101 100  CO2 27 26 28   GLUCOSE 106* 109* 108*  BUN 8 10 12   CREATININE 0.87 0.75 0.91  CALCIUM 8.8* 8.7* 8.7*  GFRNONAA >60 >60 >60  GFRAA >60 >60 >60  ANIONGAP 9 10 9      Hematology Recent Labs  Lab 09/26/18 0648 09/26/18 1637  WBC 7.2  --   RBC 4.37  --   HGB 12.9*  --   HCT 40.1 37.9  MCV 91.8  --   MCH 29.5  --   MCHC 32.2  --   RDW 15.2  --   PLT 246  --     BNP Recent Labs  Lab 09/26/18 1433 09/27/18 0007  BNP 67.4 71.1     DDimer No results for input(s): DDIMER in the last 168 hours.   Radiology    Nm Myocar Multi W/spect W/wall Motion / Ef  Result Date: 09/28/2018  There was no ST segment deviation  noted during stress.  No T wave inversion was noted during stress.  Defect 1: There is a small defect of mild severity present in the apex location.  This is a low risk study.  The left ventricular ejection fraction is mildly decreased (45-54%).  Nuclear stress EF: 48%.  Low risk stress nuclear study with a small fixed apical defect, otherwise normal perfusion and mildly reduced left ventricular global systolic function.   Cardiac Studies   Lower extremity arterial dopplers 04/25/2018  Summary: Right: The right toe-brachial index is abnormal. Although ankle brachial indices are within normal limits (0.95-1.29), arterial Doppler waveforms at the ankle suggest some component of arterial occlusive disease. Left: Resting left ankle-brachial index indicates mild left lower extremity arterial disease. The left toe-brachial index is abnormal.   Patient Profile     58 y.o. 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 He has had tumultuous course lately outlined in complex consult note with bradycardia requiring cessation of beta blocker. He has also had persistent dizziness/gait unsteadiness and inability to tolerate many cardiac meds all at once so stopped them earlier this summer. He also has developed a significant nonhealing wound on his left shin, with evidence of PAD on noninvasive testing. He presented specifically with symptoms of chest pain.   Assessment & Plan    1  Chest pain   Pt with known CAD.   Nuc was low risk with small fixed nonreversible defect but otherwise normal perfusion.  EF 48% --was for cardiac MRI but pt could not tolerate.  Attempted but unsuccessful.   -- will do outpt PYP scan for amyloid check urine SPEP and UPEP today, keep until complete.    2  HTN BP 128/70   3 Chronic diastolic CHF is neg 1856 since admit wt down 1 Kg.  Add lasix 40 IV today and then 40 daily beginning tomorrow and have HF RN see today for diet, scales - plan for outpt AHF appt  8/21 at 10 AM  4  Rhythm   Bracyardia with alos complaints of palpitaitons   REcomm event monitor.  Avoid AVN blocking agents for now  HR to 37 but up to 137 at other times.   5   Dizziness   Pt with chronic dizzinss/unstadiness  6.  Non healing wound - seen by Dr. Gwenlyn Found yesterday "He has a history of diastolic heart failure, hypertension, hyperlipidemia, tobacco abuse, morbid obesity and lower extremity edema.  He is admitted with shortness of breath chest pain and palpitations and is being worked up for that.  He had trauma to his left shin in May which progressed to a significant ulcer.  He had Doppler studies performed in our office which revealed fairly normal ABIs, no significant iliac or SFA disease with one-vessel runoff on the left via the peroneal.  He really denies claudication.  His exam is benign other than 2-3+ pitting edema in his lower extremities.  His left leg is wrapped although I did see a picture  of his wound which looks more like a venous stasis ulcer.  I am not sure there is benefit to angiography and tibial intervention at this point.  I recommend diuresis, compression stockings, frequent wound care.  I suspect the ulcer will heal with these measures.  He should probably be referred to the wound care center for more comprehensive management.  I would be happy to  see back in the office in follow-up."  For questions or updates, please contact [place] Please consult [email or web address] for contact info under        Signed, Dorris Carnes, MD  09/29/2018, 7:43 AM

## 2018-10-03 ENCOUNTER — Emergency Department (HOSPITAL_COMMUNITY)
Admission: EM | Admit: 2018-10-03 | Discharge: 2018-10-03 | Disposition: A | Discharge status: Home / Self Care | Payer: Medicaid Other | Attending: Emergency Medicine | Admitting: Emergency Medicine

## 2018-10-03 ENCOUNTER — Other Ambulatory Visit: Payer: Self-pay

## 2018-10-03 ENCOUNTER — Encounter (HOSPITAL_COMMUNITY): Payer: Self-pay | Admitting: Emergency Medicine

## 2018-10-03 ENCOUNTER — Other Ambulatory Visit: Payer: Self-pay | Admitting: Nurse Practitioner

## 2018-10-03 ENCOUNTER — Emergency Department (HOSPITAL_COMMUNITY): Payer: Medicaid Other

## 2018-10-03 DIAGNOSIS — R110 Nausea: Secondary | ICD-10-CM | POA: Insufficient documentation

## 2018-10-03 DIAGNOSIS — R0789 Other chest pain: Secondary | ICD-10-CM | POA: Diagnosis not present

## 2018-10-03 DIAGNOSIS — R079 Chest pain, unspecified: Secondary | ICD-10-CM

## 2018-10-03 DIAGNOSIS — Z9981 Dependence on supplemental oxygen: Secondary | ICD-10-CM | POA: Insufficient documentation

## 2018-10-03 DIAGNOSIS — R51 Headache: Secondary | ICD-10-CM | POA: Diagnosis not present

## 2018-10-03 DIAGNOSIS — Z79899 Other long term (current) drug therapy: Secondary | ICD-10-CM | POA: Insufficient documentation

## 2018-10-03 DIAGNOSIS — I5030 Unspecified diastolic (congestive) heart failure: Secondary | ICD-10-CM

## 2018-10-03 DIAGNOSIS — I5032 Chronic diastolic (congestive) heart failure: Secondary | ICD-10-CM | POA: Diagnosis not present

## 2018-10-03 DIAGNOSIS — R5383 Other fatigue: Secondary | ICD-10-CM | POA: Insufficient documentation

## 2018-10-03 DIAGNOSIS — R42 Dizziness and giddiness: Secondary | ICD-10-CM

## 2018-10-03 DIAGNOSIS — I110 Hypertensive heart disease with heart failure: Secondary | ICD-10-CM | POA: Diagnosis not present

## 2018-10-03 DIAGNOSIS — Z20828 Contact with and (suspected) exposure to other viral communicable diseases: Secondary | ICD-10-CM | POA: Diagnosis not present

## 2018-10-03 DIAGNOSIS — F17210 Nicotine dependence, cigarettes, uncomplicated: Secondary | ICD-10-CM | POA: Diagnosis not present

## 2018-10-03 DIAGNOSIS — R5381 Other malaise: Secondary | ICD-10-CM | POA: Insufficient documentation

## 2018-10-03 DIAGNOSIS — I2510 Atherosclerotic heart disease of native coronary artery without angina pectoris: Secondary | ICD-10-CM | POA: Diagnosis not present

## 2018-10-03 DIAGNOSIS — I499 Cardiac arrhythmia, unspecified: Secondary | ICD-10-CM | POA: Diagnosis not present

## 2018-10-03 DIAGNOSIS — R0602 Shortness of breath: Secondary | ICD-10-CM | POA: Diagnosis not present

## 2018-10-03 DIAGNOSIS — R069 Unspecified abnormalities of breathing: Secondary | ICD-10-CM | POA: Diagnosis not present

## 2018-10-03 DIAGNOSIS — M7918 Myalgia, other site: Secondary | ICD-10-CM | POA: Diagnosis present

## 2018-10-03 DIAGNOSIS — I10 Essential (primary) hypertension: Secondary | ICD-10-CM | POA: Diagnosis not present

## 2018-10-03 DIAGNOSIS — R002 Palpitations: Secondary | ICD-10-CM

## 2018-10-03 LAB — CBC WITH DIFFERENTIAL/PLATELET
Abs Immature Granulocytes: 0.04 10*3/uL (ref 0.00–0.07)
Basophils Absolute: 0 10*3/uL (ref 0.0–0.1)
Basophils Relative: 0 %
Eosinophils Absolute: 0.1 10*3/uL (ref 0.0–0.5)
Eosinophils Relative: 1 %
HCT: 40.6 % (ref 39.0–52.0)
Hemoglobin: 13.4 g/dL (ref 13.0–17.0)
Immature Granulocytes: 0 %
Lymphocytes Relative: 25 %
Lymphs Abs: 2.5 10*3/uL (ref 0.7–4.0)
MCH: 29.5 pg (ref 26.0–34.0)
MCHC: 33 g/dL (ref 30.0–36.0)
MCV: 89.2 fL (ref 80.0–100.0)
Monocytes Absolute: 1.1 10*3/uL — ABNORMAL HIGH (ref 0.1–1.0)
Monocytes Relative: 11 %
Neutro Abs: 6.1 10*3/uL (ref 1.7–7.7)
Neutrophils Relative %: 63 %
Platelets: 252 10*3/uL (ref 150–400)
RBC: 4.55 MIL/uL (ref 4.22–5.81)
RDW: 14.9 % (ref 11.5–15.5)
WBC: 9.9 10*3/uL (ref 4.0–10.5)
nRBC: 0 % (ref 0.0–0.2)

## 2018-10-03 LAB — COMPREHENSIVE METABOLIC PANEL
ALT: 37 U/L (ref 0–44)
AST: 23 U/L (ref 15–41)
Albumin: 3.7 g/dL (ref 3.5–5.0)
Alkaline Phosphatase: 88 U/L (ref 38–126)
Anion gap: 10 (ref 5–15)
BUN: 12 mg/dL (ref 6–20)
CO2: 27 mmol/L (ref 22–32)
Calcium: 9.1 mg/dL (ref 8.9–10.3)
Chloride: 102 mmol/L (ref 98–111)
Creatinine, Ser: 1.06 mg/dL (ref 0.61–1.24)
GFR calc Af Amer: >60 mL/min (ref >60)
GFR calc non Af Amer: >60 mL/min (ref >60)
Glucose, Bld: 107 mg/dL — ABNORMAL HIGH (ref 70–99)
Potassium: 3.8 mmol/L (ref 3.5–5.1)
Sodium: 139 mmol/L (ref 135–145)
Total Bilirubin: 0.8 mg/dL (ref 0.3–1.2)
Total Protein: 6.6 g/dL (ref 6.5–8.1)

## 2018-10-03 LAB — MAGNESIUM: Magnesium: 1.8 mg/dL (ref 1.7–2.4)

## 2018-10-03 LAB — PROTIME-INR
INR: 1.1 (ref 0.8–1.2)
Prothrombin Time: 14.3 seconds (ref 11.4–15.2)

## 2018-10-03 LAB — URINALYSIS, ROUTINE W REFLEX MICROSCOPIC
Bilirubin Urine: NEGATIVE
Glucose, UA: NEGATIVE mg/dL
Hgb urine dipstick: NEGATIVE
Ketones, ur: NEGATIVE mg/dL
Leukocytes,Ua: NEGATIVE
Nitrite: NEGATIVE
Protein, ur: NEGATIVE mg/dL
Specific Gravity, Urine: 1.009 (ref 1.005–1.030)
pH: 6 (ref 5.0–8.0)

## 2018-10-03 LAB — LACTIC ACID, PLASMA: Lactic Acid, Venous: 0.8 mmol/L (ref 0.5–1.9)

## 2018-10-03 LAB — TROPONIN I (HIGH SENSITIVITY): Troponin I (High Sensitivity): 20 ng/L — ABNORMAL HIGH (ref <18)

## 2018-10-03 LAB — PHOSPHORUS: Phosphorus: 3.1 mg/dL (ref 2.5–4.6)

## 2018-10-03 LAB — CBG MONITORING, ED: Glucose-Capillary: 101 mg/dL — ABNORMAL HIGH (ref 70–99)

## 2018-10-03 LAB — TSH: TSH: 1.175 u[IU]/mL (ref 0.350–4.500)

## 2018-10-03 LAB — SARS CORONAVIRUS 2 BY RT PCR (HOSPITAL ORDER, PERFORMED IN ~~LOC~~ HOSPITAL LAB): SARS Coronavirus 2: NEGATIVE

## 2018-10-03 LAB — LIPASE, BLOOD: Lipase: 23 U/L (ref 11–51)

## 2018-10-03 LAB — BRAIN NATRIURETIC PEPTIDE: B Natriuretic Peptide: 34.8 pg/mL (ref 0.0–100.0)

## 2018-10-03 MED ORDER — ALBUTEROL SULFATE HFA 108 (90 BASE) MCG/ACT IN AERS
4.0000 | INHALATION_SPRAY | Freq: Once | RESPIRATORY_TRACT | Status: AC
  Administered 2018-10-03: 23:00:00 4 via RESPIRATORY_TRACT
  Filled 2018-10-03: qty 6.7
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
## 2018-10-03 NOTE — ED Triage Notes (Signed)
Pt BIB [facility] from home, recently admitted for CHF exacerbation, reports shortness of breath has not improved since discharge. Started on lasix states he has been taking as prescribed. Also c/o constant headache x 5 days. EMS VS: BP 154/88, HR 88, SpO2 98% room air

## 2018-10-03 NOTE — ED Notes (Signed)
Pt. Stated that he took 325mg  asa, and 10mg  vasotec. 30 minutes prior to EMS arrival.

## 2018-10-03 NOTE — Discharge Instructions (Addendum)
1.  Follow-up with your cardiologist as soon as possible, preferably this week. 2.  See your family doctor for recheck as well. 3.  At this time your labs and vital signs are normal.  Continue your regularly prescribed medications and see your doctor for continued close monitoring of your labs.

## 2018-10-03 NOTE — ED Notes (Signed)
Pt discharged from ED; instructions provided; Pt encouraged to return to ED if symptoms worsen and to f/u with PCP; Pt verbalized understanding of all instructions 

## 2018-10-04 LAB — TROPONIN I (HIGH SENSITIVITY): Troponin I (High Sensitivity): 26 ng/L — ABNORMAL HIGH (ref <18)

## 2018-10-05 DIAGNOSIS — I110 Hypertensive heart disease with heart failure: Secondary | ICD-10-CM | POA: Diagnosis not present

## 2018-10-05 DIAGNOSIS — G629 Polyneuropathy, unspecified: Secondary | ICD-10-CM | POA: Diagnosis not present

## 2018-10-05 DIAGNOSIS — G4730 Sleep apnea, unspecified: Secondary | ICD-10-CM | POA: Diagnosis not present

## 2018-10-05 DIAGNOSIS — I87332 Chronic venous hypertension (idiopathic) with ulcer and inflammation of left lower extremity: Secondary | ICD-10-CM | POA: Diagnosis not present

## 2018-10-05 DIAGNOSIS — L97822 Non-pressure chronic ulcer of other part of left lower leg with fat layer exposed: Secondary | ICD-10-CM | POA: Diagnosis not present

## 2018-10-05 DIAGNOSIS — I509 Heart failure, unspecified: Secondary | ICD-10-CM | POA: Diagnosis not present

## 2018-10-05 DIAGNOSIS — I2510 Atherosclerotic heart disease of native coronary artery without angina pectoris: Secondary | ICD-10-CM | POA: Diagnosis not present

## 2018-10-10 DIAGNOSIS — D539 Nutritional anemia, unspecified: Secondary | ICD-10-CM | POA: Diagnosis not present

## 2018-10-10 DIAGNOSIS — Z131 Encounter for screening for diabetes mellitus: Secondary | ICD-10-CM | POA: Diagnosis not present

## 2018-10-10 DIAGNOSIS — Z13220 Encounter for screening for lipoid disorders: Secondary | ICD-10-CM | POA: Diagnosis not present

## 2018-10-10 DIAGNOSIS — Z0000 Encounter for general adult medical examination without abnormal findings: Secondary | ICD-10-CM | POA: Diagnosis not present

## 2018-10-10 DIAGNOSIS — Z1159 Encounter for screening for other viral diseases: Secondary | ICD-10-CM | POA: Diagnosis not present

## 2018-10-10 DIAGNOSIS — I6523 Occlusion and stenosis of bilateral carotid arteries: Secondary | ICD-10-CM | POA: Diagnosis not present

## 2018-10-10 DIAGNOSIS — E559 Vitamin D deficiency, unspecified: Secondary | ICD-10-CM | POA: Diagnosis not present

## 2018-10-10 DIAGNOSIS — Z79899 Other long term (current) drug therapy: Secondary | ICD-10-CM | POA: Diagnosis not present

## 2018-10-10 DIAGNOSIS — Z114 Encounter for screening for human immunodeficiency virus [HIV]: Secondary | ICD-10-CM | POA: Diagnosis not present

## 2018-10-10 DIAGNOSIS — Z125 Encounter for screening for malignant neoplasm of prostate: Secondary | ICD-10-CM | POA: Diagnosis not present

## 2018-10-10 DIAGNOSIS — Z6841 Body Mass Index (BMI) 40.0 and over, adult: Secondary | ICD-10-CM | POA: Diagnosis not present

## 2018-10-11 ENCOUNTER — Telehealth: Payer: Self-pay | Admitting: Physician Assistant

## 2018-10-11 ENCOUNTER — Ambulatory Visit: Payer: Medicaid Other | Admitting: Physician Assistant

## 2018-10-11 NOTE — Telephone Encounter (Signed)
   Pt was on schedule to be seen today in follow-up. It is noted that he has PYP scan tomorrow and first heart failure visit on Thursday. It likely makes sense to have PYP scan results before seeing patient in f/u. He has been constrained by finances in the past so have offered to him if he wants to defer today's appt and keep plans for PYP scan and HF f/u this week, that would be fine with me. As always he knows we are happy to see him if he wants to keep appt, but was just hoping to make things easier for him. He wishes to defer this appt today. He will keep appt for scan tomorrow and HF team this week. He also has f/u with Dr. Radford Pax 9/3. Paeton Studer PA-C

## 2018-10-12 ENCOUNTER — Encounter (INDEPENDENT_AMBULATORY_CARE_PROVIDER_SITE_OTHER): Payer: Self-pay

## 2018-10-12 ENCOUNTER — Ambulatory Visit (HOSPITAL_COMMUNITY): Payer: Medicaid Other | Attending: Cardiology

## 2018-10-12 ENCOUNTER — Other Ambulatory Visit: Payer: Self-pay

## 2018-10-12 DIAGNOSIS — R42 Dizziness and giddiness: Secondary | ICD-10-CM | POA: Diagnosis not present

## 2018-10-12 DIAGNOSIS — R002 Palpitations: Secondary | ICD-10-CM | POA: Insufficient documentation

## 2018-10-12 DIAGNOSIS — I5030 Unspecified diastolic (congestive) heart failure: Secondary | ICD-10-CM | POA: Insufficient documentation

## 2018-10-12 MED ORDER — TECHNETIUM TC 99M PYROPHOSPHATE
21.3000 | Freq: Once | INTRAVENOUS | Status: AC
  Administered 2018-10-12: 21.3 via INTRAVENOUS

## 2018-10-13 ENCOUNTER — Encounter (HOSPITAL_COMMUNITY): Payer: Self-pay

## 2018-10-13 ENCOUNTER — Other Ambulatory Visit: Payer: Self-pay

## 2018-10-13 ENCOUNTER — Ambulatory Visit (HOSPITAL_COMMUNITY)
Admit: 2018-10-13 | Discharge: 2018-10-13 | Disposition: A | Discharge status: Home / Self Care | Payer: Medicaid Other | Source: Ambulatory Visit | Attending: Internal Medicine | Admitting: Internal Medicine

## 2018-10-13 VITALS — BP 131/74 | HR 92 | Wt 317.6 lb

## 2018-10-13 DIAGNOSIS — I5032 Chronic diastolic (congestive) heart failure: Secondary | ICD-10-CM | POA: Diagnosis not present

## 2018-10-13 DIAGNOSIS — I509 Heart failure, unspecified: Secondary | ICD-10-CM | POA: Diagnosis not present

## 2018-10-13 DIAGNOSIS — G4730 Sleep apnea, unspecified: Secondary | ICD-10-CM | POA: Diagnosis not present

## 2018-10-13 DIAGNOSIS — L97822 Non-pressure chronic ulcer of other part of left lower leg with fat layer exposed: Secondary | ICD-10-CM | POA: Diagnosis not present

## 2018-10-13 DIAGNOSIS — I87312 Chronic venous hypertension (idiopathic) with ulcer of left lower extremity: Secondary | ICD-10-CM | POA: Diagnosis not present

## 2018-10-13 DIAGNOSIS — I110 Hypertensive heart disease with heart failure: Secondary | ICD-10-CM | POA: Diagnosis not present

## 2018-10-13 DIAGNOSIS — I2510 Atherosclerotic heart disease of native coronary artery without angina pectoris: Secondary | ICD-10-CM | POA: Diagnosis not present

## 2018-10-13 DIAGNOSIS — R9431 Abnormal electrocardiogram [ECG] [EKG]: Secondary | ICD-10-CM | POA: Insufficient documentation

## 2018-10-13 DIAGNOSIS — M79662 Pain in left lower leg: Secondary | ICD-10-CM | POA: Diagnosis not present

## 2018-10-13 DIAGNOSIS — G629 Polyneuropathy, unspecified: Secondary | ICD-10-CM | POA: Diagnosis not present

## 2018-10-13 DIAGNOSIS — I87332 Chronic venous hypertension (idiopathic) with ulcer and inflammation of left lower extremity: Secondary | ICD-10-CM | POA: Diagnosis not present

## 2018-10-13 MED ORDER — PANTOPRAZOLE SODIUM 40 MG PO TBEC: 40.0000 mg | DELAYED_RELEASE_TABLET | Freq: Every day | ORAL | 0 refills | Status: DC

## 2018-10-13 MED ORDER — SPIRONOLACTONE 25 MG PO TABS: 12.5000 mg | ORAL_TABLET | Freq: Every day | ORAL | 3 refills | Status: DC

## 2018-10-13 NOTE — Patient Instructions (Signed)
EKG done today.   START Spironolactone 12.5mg  (0.5 tab) daily  Lab work will need to be done in 1 week.  Please follow up with the [facility] in 8 weeks.  At the [facility], you and your health needs are our priority. As part of our continuing mission to provide you with exceptional heart care, we have created designated Provider Care Teams. These Care Teams include your primary Cardiologist (physician) and Advanced Practice Providers (APPs- Physician Assistants and Nurse Practitioners) who all work together to provide you with the care you need, when you need it.   You may see any of the following providers on your designated Care Team at your next follow up: Marland Kitchen Dr Glori Bickers . Dr Loralie Champagne . Darrick Grinder, NP   Please be sure to bring in all your medications bottles to every appointment.

## 2018-10-13 NOTE — Progress Notes (Signed)
Genetic saliva TTR test complete and sent via fedex.

## 2018-10-15 ENCOUNTER — Encounter (HOSPITAL_COMMUNITY): Payer: Self-pay | Admitting: Emergency Medicine

## 2018-10-15 ENCOUNTER — Other Ambulatory Visit: Payer: Self-pay

## 2018-10-15 ENCOUNTER — Emergency Department (HOSPITAL_COMMUNITY): Payer: Medicaid Other

## 2018-10-15 ENCOUNTER — Observation Stay (HOSPITAL_COMMUNITY)
Admission: EM | Admit: 2018-10-15 | Discharge: 2018-10-16 | Disposition: A | Discharge status: Home / Self Care | Payer: Medicaid Other | Attending: Internal Medicine | Admitting: Internal Medicine

## 2018-10-15 DIAGNOSIS — Z8249 Family history of ischemic heart disease and other diseases of the circulatory system: Secondary | ICD-10-CM | POA: Diagnosis not present

## 2018-10-15 DIAGNOSIS — Z6841 Body Mass Index (BMI) 40.0 and over, adult: Secondary | ICD-10-CM | POA: Insufficient documentation

## 2018-10-15 DIAGNOSIS — R69 Illness, unspecified: Secondary | ICD-10-CM | POA: Diagnosis not present

## 2018-10-15 DIAGNOSIS — I10 Essential (primary) hypertension: Secondary | ICD-10-CM | POA: Diagnosis not present

## 2018-10-15 DIAGNOSIS — J80 Acute respiratory distress syndrome: Secondary | ICD-10-CM | POA: Diagnosis not present

## 2018-10-15 DIAGNOSIS — Z20828 Contact with and (suspected) exposure to other viral communicable diseases: Secondary | ICD-10-CM | POA: Diagnosis not present

## 2018-10-15 DIAGNOSIS — Z209 Contact with and (suspected) exposure to unspecified communicable disease: Secondary | ICD-10-CM | POA: Diagnosis not present

## 2018-10-15 DIAGNOSIS — R0602 Shortness of breath: Secondary | ICD-10-CM

## 2018-10-15 DIAGNOSIS — R000 Tachycardia, unspecified: Secondary | ICD-10-CM | POA: Diagnosis not present

## 2018-10-15 DIAGNOSIS — J9600 Acute respiratory failure, unspecified whether with hypoxia or hypercapnia: Secondary | ICD-10-CM | POA: Diagnosis not present

## 2018-10-15 DIAGNOSIS — E785 Hyperlipidemia, unspecified: Secondary | ICD-10-CM | POA: Diagnosis not present

## 2018-10-15 DIAGNOSIS — R0603 Acute respiratory distress: Secondary | ICD-10-CM | POA: Diagnosis present

## 2018-10-15 DIAGNOSIS — F1410 Cocaine abuse, uncomplicated: Secondary | ICD-10-CM | POA: Diagnosis not present

## 2018-10-15 DIAGNOSIS — I110 Hypertensive heart disease with heart failure: Secondary | ICD-10-CM | POA: Diagnosis not present

## 2018-10-15 DIAGNOSIS — J385 Laryngeal spasm: Secondary | ICD-10-CM | POA: Diagnosis not present

## 2018-10-15 DIAGNOSIS — G4733 Obstructive sleep apnea (adult) (pediatric): Secondary | ICD-10-CM | POA: Diagnosis not present

## 2018-10-15 DIAGNOSIS — I2510 Atherosclerotic heart disease of native coronary artery without angina pectoris: Secondary | ICD-10-CM | POA: Insufficient documentation

## 2018-10-15 DIAGNOSIS — Z86711 Personal history of pulmonary embolism: Secondary | ICD-10-CM | POA: Diagnosis not present

## 2018-10-15 DIAGNOSIS — I5032 Chronic diastolic (congestive) heart failure: Secondary | ICD-10-CM | POA: Insufficient documentation

## 2018-10-15 DIAGNOSIS — Z7982 Long term (current) use of aspirin: Secondary | ICD-10-CM | POA: Diagnosis not present

## 2018-10-15 DIAGNOSIS — R079 Chest pain, unspecified: Secondary | ICD-10-CM | POA: Diagnosis not present

## 2018-10-15 DIAGNOSIS — Z79899 Other long term (current) drug therapy: Secondary | ICD-10-CM | POA: Insufficient documentation

## 2018-10-15 DIAGNOSIS — F17210 Nicotine dependence, cigarettes, uncomplicated: Secondary | ICD-10-CM | POA: Diagnosis not present

## 2018-10-15 DIAGNOSIS — J441 Chronic obstructive pulmonary disease with (acute) exacerbation: Secondary | ICD-10-CM | POA: Diagnosis not present

## 2018-10-15 DIAGNOSIS — R0689 Other abnormalities of breathing: Secondary | ICD-10-CM | POA: Diagnosis not present

## 2018-10-15 LAB — COMPREHENSIVE METABOLIC PANEL
ALT: 34 U/L (ref 0–44)
AST: 30 U/L (ref 15–41)
Albumin: 3.9 g/dL (ref 3.5–5.0)
Alkaline Phosphatase: 89 U/L (ref 38–126)
Anion gap: 13 (ref 5–15)
BUN: 15 mg/dL (ref 6–20)
CO2: 22 mmol/L (ref 22–32)
Calcium: 8.9 mg/dL (ref 8.9–10.3)
Chloride: 104 mmol/L (ref 98–111)
Creatinine, Ser: 1.03 mg/dL (ref 0.61–1.24)
GFR calc Af Amer: >60 mL/min (ref >60)
GFR calc non Af Amer: >60 mL/min (ref >60)
Glucose, Bld: 165 mg/dL — ABNORMAL HIGH (ref 70–99)
Potassium: 3.4 mmol/L — ABNORMAL LOW (ref 3.5–5.1)
Sodium: 139 mmol/L (ref 135–145)
Total Bilirubin: 0.5 mg/dL (ref 0.3–1.2)
Total Protein: 6.9 g/dL (ref 6.5–8.1)

## 2018-10-15 LAB — PROTIME-INR
INR: 1.1 (ref 0.8–1.2)
Prothrombin Time: 14 seconds (ref 11.4–15.2)

## 2018-10-15 LAB — CBC WITH DIFFERENTIAL/PLATELET
Abs Immature Granulocytes: 0.05 10*3/uL (ref 0.00–0.07)
Basophils Absolute: 0.1 10*3/uL (ref 0.0–0.1)
Basophils Relative: 0 %
Eosinophils Absolute: 0.2 10*3/uL (ref 0.0–0.5)
Eosinophils Relative: 1 %
HCT: 42.4 % (ref 39.0–52.0)
Hemoglobin: 13.9 g/dL (ref 13.0–17.0)
Immature Granulocytes: 0 %
Lymphocytes Relative: 34 %
Lymphs Abs: 4.8 10*3/uL — ABNORMAL HIGH (ref 0.7–4.0)
MCH: 29.1 pg (ref 26.0–34.0)
MCHC: 32.8 g/dL (ref 30.0–36.0)
MCV: 88.9 fL (ref 80.0–100.0)
Monocytes Absolute: 1.8 10*3/uL — ABNORMAL HIGH (ref 0.1–1.0)
Monocytes Relative: 13 %
Neutro Abs: 7.4 10*3/uL (ref 1.7–7.7)
Neutrophils Relative %: 52 %
Platelets: 288 10*3/uL (ref 150–400)
RBC: 4.77 MIL/uL (ref 4.22–5.81)
RDW: 14.7 % (ref 11.5–15.5)
WBC: 14.3 10*3/uL — ABNORMAL HIGH (ref 4.0–10.5)
nRBC: 0 % (ref 0.0–0.2)

## 2018-10-15 LAB — D-DIMER, QUANTITATIVE: D-Dimer, Quant: 0.85 ug/mL-FEU — ABNORMAL HIGH (ref 0.00–0.50)

## 2018-10-15 LAB — TRIGLYCERIDES: Triglycerides: 60 mg/dL (ref <150)

## 2018-10-15 LAB — FIBRINOGEN: Fibrinogen: 403 mg/dL (ref 210–475)

## 2018-10-15 LAB — I-STAT CHEM 8, ED
BUN: 16 mg/dL (ref 6–20)
Calcium, Ion: 1.09 mmol/L — ABNORMAL LOW (ref 1.15–1.40)
Chloride: 105 mmol/L (ref 98–111)
Creatinine, Ser: 0.9 mg/dL (ref 0.61–1.24)
Glucose, Bld: 163 mg/dL — ABNORMAL HIGH (ref 70–99)
HCT: 44 % (ref 39.0–52.0)
Hemoglobin: 15 g/dL (ref 13.0–17.0)
Potassium: 3.2 mmol/L — ABNORMAL LOW (ref 3.5–5.1)
Sodium: 140 mmol/L (ref 135–145)
TCO2: 24 mmol/L (ref 22–32)

## 2018-10-15 LAB — POCT I-STAT EG7
Acid-Base Excess: 1 mmol/L (ref 0.0–2.0)
Bicarbonate: 23.6 mmol/L (ref 20.0–28.0)
Calcium, Ion: 1.08 mmol/L — ABNORMAL LOW (ref 1.15–1.40)
HCT: 43 % (ref 39.0–52.0)
Hemoglobin: 14.6 g/dL (ref 13.0–17.0)
O2 Saturation: 99 %
Potassium: 3.2 mmol/L — ABNORMAL LOW (ref 3.5–5.1)
Sodium: 141 mmol/L (ref 135–145)
TCO2: 25 mmol/L (ref 22–32)
pCO2, Ven: 33 mmHg — ABNORMAL LOW (ref 44.0–60.0)
pH, Ven: 7.463 — ABNORMAL HIGH (ref 7.250–7.430)
pO2, Ven: 115 mmHg — ABNORMAL HIGH (ref 32.0–45.0)

## 2018-10-15 LAB — LACTIC ACID, PLASMA: Lactic Acid, Venous: 2.2 mmol/L (ref 0.5–1.9)

## 2018-10-15 LAB — LIPASE, BLOOD: Lipase: 30 U/L (ref 11–51)

## 2018-10-15 LAB — TROPONIN I (HIGH SENSITIVITY): Troponin I (High Sensitivity): 25 ng/L — ABNORMAL HIGH (ref <18)

## 2018-10-15 LAB — PROCALCITONIN: Procalcitonin: <0.1 ng/mL

## 2018-10-15 LAB — FERRITIN: Ferritin: 71 ng/mL (ref 24–336)

## 2018-10-15 LAB — C-REACTIVE PROTEIN: CRP: 1.1 mg/dL — ABNORMAL HIGH (ref <1.0)

## 2018-10-15 LAB — BRAIN NATRIURETIC PEPTIDE: B Natriuretic Peptide: 30.3 pg/mL (ref 0.0–100.0)

## 2018-10-15 LAB — LACTATE DEHYDROGENASE: LDH: 237 U/L — ABNORMAL HIGH (ref 98–192)

## 2018-10-15 MED ORDER — ENALAPRIL MALEATE 20 MG PO TABS
20.0000 mg | ORAL_TABLET | Freq: Two times a day (BID) | ORAL | Status: DC
  Administered 2018-10-16: 20 mg via ORAL
  Filled 2018-10-15 (×2): qty 1

## 2018-10-15 MED ORDER — ATORVASTATIN CALCIUM 80 MG PO TABS
80.0000 mg | ORAL_TABLET | Freq: Every day | ORAL | Status: DC
  Administered 2018-10-16: 80 mg via ORAL
  Filled 2018-10-15: qty 1

## 2018-10-15 MED ORDER — ADULT MULTIVITAMIN W/MINERALS CH
1.0000 | ORAL_TABLET | Freq: Every day | ORAL | Status: DC
  Administered 2018-10-16: 1 via ORAL
  Filled 2018-10-15: qty 1

## 2018-10-15 MED ORDER — ENALAPRIL MALEATE 20 MG PO TABS: 20.0000 mg | ORAL_TABLET | Freq: Two times a day (BID) | ORAL | Status: DC

## 2018-10-15 MED ORDER — PANTOPRAZOLE SODIUM 40 MG PO TBEC
40.0000 mg | DELAYED_RELEASE_TABLET | Freq: Every day | ORAL | Status: DC
  Administered 2018-10-16: 08:00:00 40 mg via ORAL
  Filled 2018-10-15: qty 1

## 2018-10-15 MED ORDER — IPRATROPIUM-ALBUTEROL 0.5-2.5 (3) MG/3ML IN SOLN
3.0000 mL | Freq: Once | RESPIRATORY_TRACT | Status: DC
  Filled 2018-10-15: qty 3

## 2018-10-15 MED ORDER — FUROSEMIDE 10 MG/ML IJ SOLN
40.0000 mg | Freq: Once | INTRAMUSCULAR | Status: AC
  Administered 2018-10-15: 21:00:00 40 mg via INTRAVENOUS
  Filled 2018-10-15: qty 4

## 2018-10-15 MED ORDER — LORAZEPAM 2 MG/ML IJ SOLN
0.5000 mg | Freq: Once | INTRAMUSCULAR | Status: AC
  Administered 2018-10-15: 21:00:00 0.5 mg via INTRAVENOUS
  Filled 2018-10-15: qty 1

## 2018-10-15 MED ORDER — ASPIRIN EC 81 MG PO TBEC
81.0000 mg | DELAYED_RELEASE_TABLET | Freq: Every day | ORAL | Status: DC
  Administered 2018-10-16: 81 mg via ORAL
  Filled 2018-10-15: qty 1

## 2018-10-15 MED ORDER — ALBUTEROL SULFATE (2.5 MG/3ML) 0.083% IN NEBU: 3.0000 mL | INHALATION_SOLUTION | RESPIRATORY_TRACT | Status: DC | PRN

## 2018-10-15 MED ORDER — ALBUTEROL SULFATE (2.5 MG/3ML) 0.083% IN NEBU
5.0000 mg | INHALATION_SOLUTION | Freq: Once | RESPIRATORY_TRACT | Status: DC
  Filled 2018-10-15: qty 6

## 2018-10-15 NOTE — ED Notes (Signed)
Patient transported to CT 
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
## 2018-10-15 NOTE — ED Triage Notes (Signed)
Per [facility], pt from home, sudden onset SOB, was tr-pod when EMS arrived.  Very anxious, tried to jump off stretcher.  Placed on non-rebreathe and given: 4g mag 125 sol.med.   Marland Kitchen3 epi .5 Atrovent  10 albuteral

## 2018-10-15 NOTE — ED Notes (Addendum)
Pt aware of need for urine  

## 2018-10-15 NOTE — ED Notes (Signed)
Pt found talking on phone w/o O2.  States he only needs it "every now and then."  RN placed pt on 2L .  Will continue to monitor.

## 2018-10-16 ENCOUNTER — Other Ambulatory Visit: Payer: Self-pay

## 2018-10-16 ENCOUNTER — Encounter (HOSPITAL_COMMUNITY): Payer: Self-pay | Admitting: Emergency Medicine

## 2018-10-16 ENCOUNTER — Telehealth: Payer: Self-pay | Admitting: Internal Medicine

## 2018-10-16 ENCOUNTER — Emergency Department (HOSPITAL_COMMUNITY): Payer: Medicaid Other

## 2018-10-16 ENCOUNTER — Inpatient Hospital Stay (HOSPITAL_COMMUNITY)
Admission: EM | Admit: 2018-10-16 | Discharge: 2018-10-19 | DRG: 155 | Disposition: A | Discharge status: Home / Self Care | Payer: Medicaid Other | Attending: Internal Medicine | Admitting: Internal Medicine

## 2018-10-16 ENCOUNTER — Encounter (HOSPITAL_COMMUNITY): Payer: Self-pay

## 2018-10-16 DIAGNOSIS — K219 Gastro-esophageal reflux disease without esophagitis: Secondary | ICD-10-CM | POA: Diagnosis present

## 2018-10-16 DIAGNOSIS — Z8249 Family history of ischemic heart disease and other diseases of the circulatory system: Secondary | ICD-10-CM

## 2018-10-16 DIAGNOSIS — G4733 Obstructive sleep apnea (adult) (pediatric): Secondary | ICD-10-CM | POA: Diagnosis present

## 2018-10-16 DIAGNOSIS — Z833 Family history of diabetes mellitus: Secondary | ICD-10-CM

## 2018-10-16 DIAGNOSIS — F419 Anxiety disorder, unspecified: Secondary | ICD-10-CM | POA: Diagnosis present

## 2018-10-16 DIAGNOSIS — E876 Hypokalemia: Secondary | ICD-10-CM | POA: Diagnosis present

## 2018-10-16 DIAGNOSIS — R0689 Other abnormalities of breathing: Secondary | ICD-10-CM | POA: Diagnosis not present

## 2018-10-16 DIAGNOSIS — Z20828 Contact with and (suspected) exposure to other viral communicable diseases: Secondary | ICD-10-CM | POA: Diagnosis present

## 2018-10-16 DIAGNOSIS — J441 Chronic obstructive pulmonary disease with (acute) exacerbation: Secondary | ICD-10-CM

## 2018-10-16 DIAGNOSIS — J80 Acute respiratory distress syndrome: Secondary | ICD-10-CM | POA: Diagnosis not present

## 2018-10-16 DIAGNOSIS — I5032 Chronic diastolic (congestive) heart failure: Secondary | ICD-10-CM | POA: Diagnosis present

## 2018-10-16 DIAGNOSIS — I110 Hypertensive heart disease with heart failure: Secondary | ICD-10-CM | POA: Diagnosis present

## 2018-10-16 DIAGNOSIS — R0602 Shortness of breath: Secondary | ICD-10-CM | POA: Diagnosis not present

## 2018-10-16 DIAGNOSIS — Z86711 Personal history of pulmonary embolism: Secondary | ICD-10-CM

## 2018-10-16 DIAGNOSIS — J385 Laryngeal spasm: Principal | ICD-10-CM | POA: Diagnosis present

## 2018-10-16 DIAGNOSIS — E785 Hyperlipidemia, unspecified: Secondary | ICD-10-CM | POA: Diagnosis present

## 2018-10-16 DIAGNOSIS — R064 Hyperventilation: Secondary | ICD-10-CM | POA: Diagnosis not present

## 2018-10-16 DIAGNOSIS — Z6841 Body Mass Index (BMI) 40.0 and over, adult: Secondary | ICD-10-CM

## 2018-10-16 DIAGNOSIS — J988 Other specified respiratory disorders: Secondary | ICD-10-CM | POA: Diagnosis present

## 2018-10-16 DIAGNOSIS — E669 Obesity, unspecified: Secondary | ICD-10-CM | POA: Diagnosis present

## 2018-10-16 DIAGNOSIS — Z823 Family history of stroke: Secondary | ICD-10-CM

## 2018-10-16 DIAGNOSIS — Z209 Contact with and (suspected) exposure to unspecified communicable disease: Secondary | ICD-10-CM | POA: Diagnosis not present

## 2018-10-16 DIAGNOSIS — R0603 Acute respiratory distress: Secondary | ICD-10-CM | POA: Diagnosis present

## 2018-10-16 DIAGNOSIS — F17210 Nicotine dependence, cigarettes, uncomplicated: Secondary | ICD-10-CM | POA: Diagnosis present

## 2018-10-16 DIAGNOSIS — R079 Chest pain, unspecified: Secondary | ICD-10-CM | POA: Diagnosis not present

## 2018-10-16 DIAGNOSIS — I2510 Atherosclerotic heart disease of native coronary artery without angina pectoris: Secondary | ICD-10-CM | POA: Diagnosis present

## 2018-10-16 LAB — BASIC METABOLIC PANEL
Anion gap: 9 (ref 5–15)
Anion gap: 12 (ref 5–15)
BUN: 14 mg/dL (ref 6–20)
BUN: 17 mg/dL (ref 6–20)
CO2: 23 mmol/L (ref 22–32)
CO2: 23 mmol/L (ref 22–32)
Calcium: 8.8 mg/dL — ABNORMAL LOW (ref 8.9–10.3)
Calcium: 9.1 mg/dL (ref 8.9–10.3)
Chloride: 106 mmol/L (ref 98–111)
Chloride: 101 mmol/L (ref 98–111)
Creatinine, Ser: 1.02 mg/dL (ref 0.61–1.24)
Creatinine, Ser: 1.05 mg/dL (ref 0.61–1.24)
GFR calc Af Amer: >60 mL/min (ref >60)
GFR calc Af Amer: >60 mL/min (ref >60)
GFR calc non Af Amer: >60 mL/min (ref >60)
GFR calc non Af Amer: >60 mL/min (ref >60)
Glucose, Bld: 267 mg/dL — ABNORMAL HIGH (ref 70–99)
Glucose, Bld: 212 mg/dL — ABNORMAL HIGH (ref 70–99)
Potassium: 4.1 mmol/L (ref 3.5–5.1)
Potassium: 3.9 mmol/L (ref 3.5–5.1)
Sodium: 138 mmol/L (ref 135–145)
Sodium: 136 mmol/L (ref 135–145)

## 2018-10-16 LAB — CBC WITH DIFFERENTIAL/PLATELET
Abs Immature Granulocytes: 0.13 10*3/uL — ABNORMAL HIGH (ref 0.00–0.07)
Basophils Absolute: 0 10*3/uL (ref 0.0–0.1)
Basophils Relative: 0 %
Eosinophils Absolute: 0 10*3/uL (ref 0.0–0.5)
Eosinophils Relative: 0 %
HCT: 39.9 % (ref 39.0–52.0)
Hemoglobin: 12.5 g/dL — ABNORMAL LOW (ref 13.0–17.0)
Immature Granulocytes: 1 %
Lymphocytes Relative: 10 %
Lymphs Abs: 1.7 10*3/uL (ref 0.7–4.0)
MCH: 28.8 pg (ref 26.0–34.0)
MCHC: 31.3 g/dL (ref 30.0–36.0)
MCV: 91.9 fL (ref 80.0–100.0)
Monocytes Absolute: 1.3 10*3/uL — ABNORMAL HIGH (ref 0.1–1.0)
Monocytes Relative: 7 %
Neutro Abs: 15 10*3/uL — ABNORMAL HIGH (ref 1.7–7.7)
Neutrophils Relative %: 82 %
Platelets: 265 10*3/uL (ref 150–400)
RBC: 4.34 MIL/uL (ref 4.22–5.81)
RDW: 15.3 % (ref 11.5–15.5)
WBC: 18.2 10*3/uL — ABNORMAL HIGH (ref 4.0–10.5)
nRBC: 0 % (ref 0.0–0.2)

## 2018-10-16 LAB — TROPONIN I (HIGH SENSITIVITY)
Troponin I (High Sensitivity): 29 ng/L — ABNORMAL HIGH (ref <18)
Troponin I (High Sensitivity): 31 ng/L — ABNORMAL HIGH (ref <18)

## 2018-10-16 LAB — CBC
HCT: 40.3 % (ref 39.0–52.0)
Hemoglobin: 13.2 g/dL (ref 13.0–17.0)
MCH: 29.3 pg (ref 26.0–34.0)
MCHC: 32.8 g/dL (ref 30.0–36.0)
MCV: 89.6 fL (ref 80.0–100.0)
Platelets: 260 10*3/uL (ref 150–400)
RBC: 4.5 MIL/uL (ref 4.22–5.81)
RDW: 15 % (ref 11.5–15.5)
WBC: 10.3 10*3/uL (ref 4.0–10.5)
nRBC: 0 % (ref 0.0–0.2)

## 2018-10-16 LAB — LACTIC ACID, PLASMA
Lactic Acid, Venous: 1.6 mmol/L (ref 0.5–1.9)
Lactic Acid, Venous: 2.6 mmol/L (ref 0.5–1.9)

## 2018-10-16 MED ORDER — PROMETHAZINE HCL 25 MG PO TABS: 12.5000 mg | ORAL_TABLET | Freq: Four times a day (QID) | ORAL | Status: DC | PRN

## 2018-10-16 MED ORDER — ALBUTEROL (5 MG/ML) CONTINUOUS INHALATION SOLN: 10.0000 mg/h | INHALATION_SOLUTION | RESPIRATORY_TRACT | Status: AC

## 2018-10-16 MED ORDER — AZITHROMYCIN 250 MG PO TABS: 500.0000 mg | ORAL_TABLET | Freq: Every day | ORAL | Status: DC

## 2018-10-16 MED ORDER — AZITHROMYCIN 250 MG PO TABS
500.0000 mg | ORAL_TABLET | Freq: Every day | ORAL | Status: AC
  Administered 2018-10-16: 08:00:00 500 mg via ORAL
  Filled 2018-10-16: qty 2

## 2018-10-16 MED ORDER — PREDNISONE 20 MG PO TABS: 40.0000 mg | ORAL_TABLET | Freq: Every day | ORAL | 0 refills | Status: DC

## 2018-10-16 MED ORDER — PREDNISONE 20 MG PO TABS
40.0000 mg | ORAL_TABLET | Freq: Every day | ORAL | Status: DC
  Administered 2018-10-16: 08:00:00 40 mg via ORAL
  Filled 2018-10-16: qty 2

## 2018-10-16 MED ORDER — ENOXAPARIN SODIUM 80 MG/0.8ML ~~LOC~~ SOLN
70.0000 mg | SUBCUTANEOUS | Status: DC
  Filled 2018-10-16: qty 0.7

## 2018-10-16 MED ORDER — ALBUTEROL SULFATE (2.5 MG/3ML) 0.083% IN NEBU: 3.0000 mL | INHALATION_SOLUTION | Freq: Once | RESPIRATORY_TRACT | Status: DC

## 2018-10-16 MED ORDER — SENNOSIDES-DOCUSATE SODIUM 8.6-50 MG PO TABS: 1.0000 | ORAL_TABLET | Freq: Every evening | ORAL | Status: DC | PRN

## 2018-10-16 MED ORDER — ALBUTEROL SULFATE HFA 108 (90 BASE) MCG/ACT IN AERS
INHALATION_SPRAY | RESPIRATORY_TRACT | Status: AC
  Administered 2018-10-16: 22:00:00 via RESPIRATORY_TRACT
  Filled 2018-10-16: qty 6.7

## 2018-10-16 MED ORDER — AZITHROMYCIN 250 MG PO TABS: 250.0000 mg | ORAL_TABLET | Freq: Every day | ORAL | Status: DC

## 2018-10-16 MED ORDER — ACETAMINOPHEN 650 MG RE SUPP: 650.0000 mg | Freq: Four times a day (QID) | RECTAL | Status: DC | PRN

## 2018-10-16 MED ORDER — FUROSEMIDE 40 MG PO TABS
40.0000 mg | ORAL_TABLET | Freq: Every day | ORAL | Status: DC
  Administered 2018-10-16: 40 mg via ORAL
  Filled 2018-10-16: qty 1

## 2018-10-16 MED ORDER — VALACYCLOVIR HCL 500 MG PO TABS
500.0000 mg | ORAL_TABLET | Freq: Every day | ORAL | Status: DC
  Administered 2018-10-16: 500 mg via ORAL
  Filled 2018-10-16: qty 1

## 2018-10-16 MED ORDER — ACETAMINOPHEN 325 MG PO TABS: 650.0000 mg | ORAL_TABLET | Freq: Four times a day (QID) | ORAL | Status: DC | PRN

## 2018-10-16 MED ORDER — IOHEXOL 350 MG/ML SOLN
75.0000 mL | Freq: Once | INTRAVENOUS | Status: AC | PRN
  Administered 2018-10-16: 75 mL via INTRAVENOUS

## 2018-10-16 MED FILL — predniSONE 20 MG TABS: 20 | 3 days supply | Qty: 6 | Fill #0
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
## 2018-10-16 NOTE — Progress Notes (Signed)
   Subjective:  Mr. Andujo was seen pacing his room this morning. He states that he is feeling much better than yesterday. We discussed what brought him to [facility] and he states that he felt like his throat was "closing up," after discarding the contents of his vacuum cleaner. We discussed the benefits of seeing an allergist. All questions and concerns were addressed.   Objective:  Vital signs in last 24 hours: Vitals:   10/16/18 0728 10/16/18 0734 10/16/18 0745 10/16/18 0904  BP:  (!) 117/54 133/68 (!) 154/94  Pulse: 79 81 72 83  Resp: 12 18 17 19   Temp:    97.8 F (36.6 C)  TempSrc:      SpO2: 98% 98% 97% 96%  Weight:      Height:       Physical Exam Vitals signs and nursing note reviewed.  Constitutional:      General: He is not in acute distress.    Appearance: He is obese. He is not ill-appearing, toxic-appearing or diaphoretic.  HENT:     Head: Normocephalic and atraumatic.  Cardiovascular:     Rate and Rhythm: Normal rate and regular rhythm.  Pulmonary:     Effort: Pulmonary effort is normal.     Breath sounds: Normal breath sounds. No decreased breath sounds, wheezing, rhonchi or rales.  Chest:     Chest wall: No mass, deformity, tenderness or crepitus.  Abdominal:     General: Bowel sounds are normal.     Palpations: Abdomen is soft.  Neurological:     Mental Status: He is alert.     Assessment/Plan:  Active Problems:   Acute respiratory distress  Acute Respiratory Distress 2/2 to Upper Respiratory Distress:   - Patient appears in good health, pacing around room with no shortness of breath. - CTA: Negative for emboli   - COVID: pending.  - Continue prednisone 5 day course (2/5) - Continue Duonebs PRN - Continue azithromycin 5 day course (2/5) - Continue to monitor for respiratory decline.   Hyperlactatemia: - Likely 2/2 to albuterol usage  - Lactic Acid: 2.6 - Continue to monitor   Diastolic Heart Failure (EF 48%).  - Continue Lasix 40 mg QD.    CAD/Hyperlipidemia:  - Continue Aspirin 81 mg QD - Continue Lipitor 80 mg QD  Hypertension:  - Continue enalapril 20 mg BID  Obstructive Sleep Apnea:  - Continue CPAP  Dispo: Anticipated discharge pending medical course.   Maudie Mercury, MD 10/16/2018, 10:49 AM Pager: [phone]

## 2018-10-16 NOTE — ED Notes (Signed)
X-ray at bedside
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
## 2018-10-16 NOTE — Discharge Summary (Signed)
Name: Russell Collier MRN: [id number] DOB: August 26, 1960 58 y.o. PCP: [place]  Date of Admission: 10/15/2018  7:48 PM Date of Discharge: 10/16/2018 Attending Physician: Russell Collier  Discharge Diagnosis: 1. Acute Respiratory Failure 2/2 to Laryngeal Spasm  Discharge Medications: Allergies as of 10/16/2018   No Known Allergies     Medication List    TAKE these medications   albuterol 108 (90 Base) MCG/ACT inhaler Commonly known as: VENTOLIN HFA Inhale 2 puffs into the lungs every 4 (four) hours as needed for wheezing or shortness of breath.   aspirin 325 MG tablet Take 325 mg by mouth daily. What changed: Another medication with the same name was removed. Continue taking this medication, and follow the directions you see here. Notes to patient: 10/17/18   atorvastatin 80 MG tablet Commonly known as: Lipitor Take 1 tablet (80 mg total) by mouth daily. Notes to patient: 10/17/18   enalapril 20 MG tablet Commonly known as: VASOTEC Take 20 mg by mouth 2 (two) times daily. Notes to patient: 10/16/18   furosemide 40 MG tablet Commonly known as: LASIX Take 1 tablet (40 mg total) by mouth daily. Notes to patient: 10/17/18   multivitamin capsule Take 1 capsule by mouth daily. Notes to patient: 10/17/18   pantoprazole 40 MG tablet Commonly known as: PROTONIX Take 1 tablet (40 mg total) by mouth daily at 6 (six) AM. Notes to patient: 10/17/18   predniSONE 20 MG tablet Commonly known as: DELTASONE Take 2 tablets (40 mg total) by mouth daily with breakfast for 3 days. Start taking on: October 17, 2018   spironolactone 25 MG tablet Commonly known as: ALDACTONE Take 0.5 tablets (12.5 mg total) by mouth daily. Notes to patient: 10/17/18   valACYclovir 500 MG tablet Commonly known as: VALTREX Take 500 mg by mouth daily. Notes to patient: 10/17/18       Disposition and follow-up:   Russell Collier was discharged from [facility] in Stable  condition.  At the hospital follow up visit please address:  1.  Reconciliation of his medications, investigation for need of anti-anxiety medications, and follow up for an Allergist appointment.   2.  Labs / imaging needed at time of follow-up: None  3.  Pending labs/ test needing follow-up: None  Follow-up Appointments: [facility]: 10:15 a.m. October 23, 2018  Hospital Course by problem list: Acute Respiratory Failure 2/2 to Laryngeal Spasm:  During his clinical course, [name] was diagnosed with respiratory failure. The underlying etiology of his respiratory failure aligns with an induced laryngeal spasm from particulate matter from his vacuum cleaner. Due to the sudden onset of symptoms after discarding the debris, the endorsement that he had the "feeling my throat was closing up," and a clear lung exam points towards an upper airway etiology. While a COPD/asthma exacerbation cannot be excluded, the history, and acute resolution of his symptoms lower these etiologies on the differential.   Discharge Vitals:   BP (!) 154/94   Pulse 83   Temp 97.8 F (36.6 C)   Resp 19   Ht 5\' 11"  (1.803 m)   Wt (!) 142.4 kg   SpO2 96%   BMI 43.79 kg/m   Pertinent Labs, Studies, and Procedures:  CT ANGIO CHEST:  1. Slightly limited for evaluation of emboli within the distal branch vessels due to poor opacification. No acute central embolus is seen. 2. Clear lung fields.  DG Chest Portable:  IMPRESSION: Mild cardiomegaly with mild  diffuse interstitial pulmonary opacity, likely edema. No focal airspace opacity.   Discharge Instructions: Discharge Instructions    Diet - low sodium heart healthy   Complete by: As directed    Discharge instructions   Complete by: As directed    Thank you for choosing Russell Collier for your medical needs. You are being discharged with some new medications. Please take them as directed on the label. If you experience chest pain,  shortness of breath, excessive sweating, or decline in function, please return for a reevaluation.   Increase activity slowly   Complete by: As directed       Signed: Maudie Mercury, MD 10/16/2018, 3:11 PM   Pager: [phone]

## 2018-10-16 NOTE — ED Notes (Signed)
Pt placed on hospital bed for comfort.

## 2018-10-16 NOTE — ED Notes (Signed)
Breakfast ordered--Russell Collier  

## 2018-10-16 NOTE — ED Notes (Signed)
Went in found pt w/o [place]. Pt states his breathing is better. O2 98% RA.

## 2018-10-16 NOTE — Telephone Encounter (Signed)
New HFU appt with [facility] 10/23/2018 per dr [name]
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
## 2018-10-17 ENCOUNTER — Encounter (HOSPITAL_COMMUNITY): Payer: Self-pay

## 2018-10-17 DIAGNOSIS — F419 Anxiety disorder, unspecified: Secondary | ICD-10-CM | POA: Diagnosis not present

## 2018-10-17 DIAGNOSIS — Z9989 Dependence on other enabling machines and devices: Secondary | ICD-10-CM

## 2018-10-17 DIAGNOSIS — I5032 Chronic diastolic (congestive) heart failure: Secondary | ICD-10-CM

## 2018-10-17 DIAGNOSIS — Z87891 Personal history of nicotine dependence: Secondary | ICD-10-CM | POA: Diagnosis not present

## 2018-10-17 DIAGNOSIS — G4733 Obstructive sleep apnea (adult) (pediatric): Secondary | ICD-10-CM | POA: Diagnosis not present

## 2018-10-17 DIAGNOSIS — Z79899 Other long term (current) drug therapy: Secondary | ICD-10-CM

## 2018-10-17 DIAGNOSIS — J385 Laryngeal spasm: Secondary | ICD-10-CM | POA: Diagnosis not present

## 2018-10-17 DIAGNOSIS — E785 Hyperlipidemia, unspecified: Secondary | ICD-10-CM

## 2018-10-17 DIAGNOSIS — I110 Hypertensive heart disease with heart failure: Secondary | ICD-10-CM | POA: Diagnosis not present

## 2018-10-17 DIAGNOSIS — R0603 Acute respiratory distress: Secondary | ICD-10-CM

## 2018-10-17 DIAGNOSIS — I2510 Atherosclerotic heart disease of native coronary artery without angina pectoris: Secondary | ICD-10-CM | POA: Diagnosis not present

## 2018-10-17 DIAGNOSIS — Z7982 Long term (current) use of aspirin: Secondary | ICD-10-CM | POA: Diagnosis not present

## 2018-10-17 DIAGNOSIS — E876 Hypokalemia: Secondary | ICD-10-CM | POA: Diagnosis not present

## 2018-10-17 LAB — TROPONIN I (HIGH SENSITIVITY): Troponin I (High Sensitivity): 30 ng/L — ABNORMAL HIGH (ref <18)

## 2018-10-17 LAB — SARS CORONAVIRUS 2 (TAT 6-24 HRS): SARS Coronavirus 2: NEGATIVE

## 2018-10-17 LAB — CBC
HCT: 39.7 % (ref 39.0–52.0)
Hemoglobin: 12.9 g/dL — ABNORMAL LOW (ref 13.0–17.0)
MCH: 29.3 pg (ref 26.0–34.0)
MCHC: 32.5 g/dL (ref 30.0–36.0)
MCV: 90 fL (ref 80.0–100.0)
Platelets: 262 10*3/uL (ref 150–400)
RBC: 4.41 MIL/uL (ref 4.22–5.81)
RDW: 15 % (ref 11.5–15.5)
WBC: 16.7 10*3/uL — ABNORMAL HIGH (ref 4.0–10.5)
nRBC: 0 % (ref 0.0–0.2)

## 2018-10-17 LAB — BASIC METABOLIC PANEL
Anion gap: 10 (ref 5–15)
BUN: 17 mg/dL (ref 6–20)
CO2: 24 mmol/L (ref 22–32)
Calcium: 9 mg/dL (ref 8.9–10.3)
Chloride: 104 mmol/L (ref 98–111)
Creatinine, Ser: 0.99 mg/dL (ref 0.61–1.24)
GFR calc Af Amer: >60 mL/min (ref >60)
GFR calc non Af Amer: >60 mL/min (ref >60)
Glucose, Bld: 166 mg/dL — ABNORMAL HIGH (ref 70–99)
Potassium: 3.2 mmol/L — ABNORMAL LOW (ref 3.5–5.1)
Sodium: 138 mmol/L (ref 135–145)

## 2018-10-17 LAB — NOVEL CORONAVIRUS, NAA (HOSP ORDER, SEND-OUT TO REF LAB; TAT 18-24 HRS): SARS-CoV-2, NAA: NOT DETECTED

## 2018-10-17 LAB — GLUCOSE, CAPILLARY
Glucose-Capillary: 239 mg/dL — ABNORMAL HIGH (ref 70–99)
Glucose-Capillary: 234 mg/dL — ABNORMAL HIGH (ref 70–99)

## 2018-10-17 LAB — HEMOGLOBIN A1C
Hgb A1c MFr Bld: 6.1 % — ABNORMAL HIGH (ref 4.8–5.6)
Mean Plasma Glucose: 128.37 mg/dL

## 2018-10-17 MED ORDER — HYDROXYZINE HCL 10 MG PO TABS
10.0000 mg | ORAL_TABLET | Freq: Two times a day (BID) | ORAL | Status: DC | PRN
  Filled 2018-10-17 (×2): qty 1

## 2018-10-17 MED ORDER — INSULIN ASPART 100 UNIT/ML ~~LOC~~ SOLN
0.0000 [IU] | Freq: Three times a day (TID) | SUBCUTANEOUS | Status: DC
  Administered 2018-10-17: 5 [IU] via SUBCUTANEOUS
  Administered 2018-10-18: 3 [IU] via SUBCUTANEOUS
  Administered 2018-10-19: 2 [IU] via SUBCUTANEOUS

## 2018-10-17 MED ORDER — ATORVASTATIN CALCIUM 80 MG PO TABS
80.0000 mg | ORAL_TABLET | Freq: Every day | ORAL | Status: DC
  Administered 2018-10-17 – 2018-10-19 (×3): 80 mg via ORAL
  Filled 2018-10-17 (×3): qty 1

## 2018-10-17 MED ORDER — ALBUTEROL SULFATE HFA 108 (90 BASE) MCG/ACT IN AERS
6.0000 | INHALATION_SPRAY | Freq: Once | RESPIRATORY_TRACT | Status: AC
  Administered 2018-10-16: 22:00:00 via RESPIRATORY_TRACT

## 2018-10-17 MED ORDER — ACETAMINOPHEN 325 MG PO TABS: 650.0000 mg | ORAL_TABLET | Freq: Four times a day (QID) | ORAL | Status: DC | PRN

## 2018-10-17 MED ORDER — INSULIN ASPART 100 UNIT/ML ~~LOC~~ SOLN
0.0000 [IU] | Freq: Every day | SUBCUTANEOUS | Status: DC
  Administered 2018-10-17: 3 [IU] via SUBCUTANEOUS

## 2018-10-17 MED ORDER — IPRATROPIUM-ALBUTEROL 0.5-2.5 (3) MG/3ML IN SOLN
3.0000 mL | Freq: Four times a day (QID) | RESPIRATORY_TRACT | Status: DC
  Administered 2018-10-17 – 2018-10-19 (×10): 3 mL via RESPIRATORY_TRACT
  Filled 2018-10-17 (×10): qty 3

## 2018-10-17 MED ORDER — PANTOPRAZOLE SODIUM 40 MG PO TBEC
40.0000 mg | DELAYED_RELEASE_TABLET | Freq: Every day | ORAL | Status: DC
  Administered 2018-10-17 – 2018-10-18 (×2): 40 mg via ORAL
  Filled 2018-10-17 (×2): qty 1

## 2018-10-17 MED ORDER — IPRATROPIUM BROMIDE 0.02 % IN SOLN: 0.5000 mg | RESPIRATORY_TRACT | Status: DC | PRN

## 2018-10-17 MED ORDER — HYDROXYZINE HCL 10 MG PO TABS
10.0000 mg | ORAL_TABLET | Freq: Once | ORAL | Status: AC
  Administered 2018-10-17: 10 mg via ORAL
  Filled 2018-10-17: qty 1

## 2018-10-17 MED ORDER — ACETAMINOPHEN 650 MG RE SUPP: 650.0000 mg | Freq: Four times a day (QID) | RECTAL | Status: DC | PRN

## 2018-10-17 MED ORDER — SERTRALINE HCL 25 MG PO TABS
25.0000 mg | ORAL_TABLET | Freq: Every day | ORAL | Status: DC
  Administered 2018-10-17 – 2018-10-19 (×3): 25 mg via ORAL
  Filled 2018-10-17 (×3): qty 1

## 2018-10-17 MED ORDER — ADULT MULTIVITAMIN W/MINERALS CH
1.0000 | ORAL_TABLET | Freq: Every day | ORAL | Status: DC
  Administered 2018-10-17 – 2018-10-19 (×3): 1 via ORAL
  Filled 2018-10-17 (×3): qty 1

## 2018-10-17 MED ORDER — SENNOSIDES-DOCUSATE SODIUM 8.6-50 MG PO TABS
1.0000 | ORAL_TABLET | Freq: Every evening | ORAL | Status: DC | PRN
  Administered 2018-10-17: 1 via ORAL
  Filled 2018-10-17: qty 1

## 2018-10-17 MED ORDER — ALBUTEROL SULFATE HFA 108 (90 BASE) MCG/ACT IN AERS: 2.0000 | INHALATION_SPRAY | RESPIRATORY_TRACT | Status: DC | PRN

## 2018-10-17 MED ORDER — ENALAPRIL MALEATE 20 MG PO TABS
20.0000 mg | ORAL_TABLET | Freq: Two times a day (BID) | ORAL | Status: DC
  Administered 2018-10-17 – 2018-10-19 (×5): 20 mg via ORAL
  Filled 2018-10-17 (×7): qty 1

## 2018-10-17 MED ORDER — ENOXAPARIN SODIUM 40 MG/0.4ML ~~LOC~~ SOLN
40.0000 mg | SUBCUTANEOUS | Status: DC
  Administered 2018-10-17 – 2018-10-19 (×3): 40 mg via SUBCUTANEOUS
  Filled 2018-10-17 (×4): qty 0.4

## 2018-10-17 MED ORDER — GUAIFENESIN ER 600 MG PO TB12
600.0000 mg | ORAL_TABLET | Freq: Two times a day (BID) | ORAL | Status: DC
  Administered 2018-10-17 – 2018-10-19 (×5): 600 mg via ORAL
  Filled 2018-10-17 (×5): qty 1

## 2018-10-17 MED ORDER — PREDNISONE 20 MG PO TABS
40.0000 mg | ORAL_TABLET | Freq: Every day | ORAL | Status: DC
  Administered 2018-10-17 – 2018-10-19 (×3): 40 mg via ORAL
  Filled 2018-10-17 (×3): qty 2

## 2018-10-17 MED ORDER — ALBUTEROL SULFATE (2.5 MG/3ML) 0.083% IN NEBU: 2.5000 mg | INHALATION_SOLUTION | Freq: Four times a day (QID) | RESPIRATORY_TRACT | Status: DC | PRN

## 2018-10-17 MED ORDER — ASPIRIN 325 MG PO TABS
325.0000 mg | ORAL_TABLET | Freq: Every day | ORAL | Status: DC
  Administered 2018-10-17 – 2018-10-19 (×3): 325 mg via ORAL
  Filled 2018-10-17 (×3): qty 1

## 2018-10-17 MED ORDER — POTASSIUM CHLORIDE 20 MEQ PO PACK
40.0000 meq | PACK | Freq: Two times a day (BID) | ORAL | Status: AC
  Administered 2018-10-17 (×2): 40 meq via ORAL
  Filled 2018-10-17 (×2): qty 2

## 2018-10-17 MED ORDER — PROMETHAZINE HCL 25 MG PO TABS: 12.5000 mg | ORAL_TABLET | Freq: Four times a day (QID) | ORAL | Status: DC | PRN

## 2018-10-17 NOTE — Plan of Care (Signed)
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
## 2018-10-17 NOTE — ED Notes (Signed)
Report given to 2W. All questions answered 

## 2018-10-17 NOTE — Progress Notes (Signed)
Accepted report from evening RN. Introductions complete.  Noted patient complaints of saliva, meds and fluids "feeling" stuck. Noted blood sugars remaining between 160s and 230s. CBGs; A1C ordered. Speech therapy complete.  1430: C/O chest ache and burning after receiving neb tx. No other complaints. VS obtained 121/60. Subjective: anxious after discussing "zoloft" stating "bug out med". MD paged> recommended offering PRN hydroxyzine. Patient declined stating "feeling much better".
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
## 2018-10-17 NOTE — Progress Notes (Signed)
Inpatient Diabetes Program Recommendations  AACE/ADA: New Consensus Statement on Inpatient Glycemic Control (2015)  Target Ranges:  Prepandial:   less than 140 mg/dL      Peak postprandial:   less than 180 mg/dL (1-2 hours)      Critically ill patients:  140 - 180 mg/dL   Lab Results  Component Value Date   GLUCAP 234 (H) 10/17/2018   HGBA1C 5.7 (H) 02/02/2018    Review of Glycemic Control Results for Russell Collier, Russell Collier (MRN [id number]) as of 10/17/2018 13:55  Ref. Range 10/17/2018 08:27 10/17/2018 12:04  Glucose-Capillary Latest Ref Range: 70 - 99 mg/dL 239 (H) 234 (H)   Diabetes history: None Outpatient Diabetes medications:  None Current orders for Inpatient glycemic control: None Prednisone 40 mg q AM Inpatient Diabetes Program Recommendations:    Called and discussed elevated blood glucose with MD.  Orders received for Novolog moderate tid with meals and HS.    Thanks,  Adah Perl, RN, BC-ADM Inpatient Diabetes Coordinator Pager [phone] (8a-5p)

## 2018-10-17 NOTE — ED Provider Notes (Signed)
Patient seen yesterday will move to the hospital for COPD exacerbation.  States that he will fluids this afternoon, and when he got home he has of his house and began having additional COPD exacerbation.  Signed out to me at shift change.  Still has bibasilar wheezes.  Still very short of breath despite albuterol puffs.  Will consult internal medicine for readmission.   Kimberly, [place], PA-C 10/17/18 [id number]    Tegeler, Gwenyth Allegra, MD 10/17/18 1153

## 2018-10-17 NOTE — Progress Notes (Signed)
Pt. Refused cpap. RT informed pt. To notify if he changes his mind. 

## 2018-10-17 NOTE — Progress Notes (Addendum)
   Subjective:  Russell Collier was seen at bedside with PT. We spoke together about his most recent incident. He states it felt similar to the last episode. He describes that he cannot breath from his throat. He attributes a part of his symptoms to anxiety. We also spoke with his niece who is a Economist at the [employer] [place] about her uncle's current stay. All questions and concerns were addressed.  Objective:  Vital signs in last 24 hours: Vitals:   10/17/18 0545 10/17/18 0600 10/17/18 0639 10/17/18 0750  BP: 130/65 125/88 (!) 150/70 (!) 148/80  Pulse: (!) 59  60 72  Resp: 18 13 (!) 22   Temp:   98 F (36.7 C) 97.9 F (36.6 C)  TempSrc:   Oral Oral  SpO2: 98%  99% 98%  Weight:      Height:       Physical Exam: General: Well developed and well nourished, alert and oriented, cooperative. Head: Normocephalic, atraumatic.  Cardiac: RRR, Normal S1 and S2, no murmurs, rubs, or gallops.  Abdomen: + bowel sounds, soft, non distended.  Pulmonary: Clear to auscultation bilaterally, faint wheezes auscultated bilaterally, no rales, or rhonchi.   Assessment/Plan:  Principal Problem:   Acute respiratory distress Active Problems:   Laryngeal spasm  Acute Respiratory Distress 2/2 inducible laryngeal spasm versus COPD/asthma:    - Continue prednisone 40mg  daily - Continue Protonix 40 mg.  - Ordered PFT's with methacholine challenge.  - Continue duonebs. Consider heliox treatment. - Continue hydroxyzine 10mg  prn for anxiety. - SLP for eval, treat and possibly provide some education from their perspective. - Consider outpatient ENT referral.  Hypokalemia:  - K: 3.2 - Potassium Chloride packet 40 mg BID.    Chronic Diastolic heart failure (EF 48%) - Continue 40mg  Lasix.   CAD/hyperlipidemia:  - Continue Asprin 81mg   - Lipitor 80mg  QD.   HTN.  - Continue enalapril 20mg  BID   OSA - Continue CPAP.  Anxiety:  - Hydroxyzine 10 mg BID PRN. - Sertraline 25 mg QD.   Dispo: Anticipated  discharge pending medical course.   Maudie Mercury, MD 10/17/2018, 8:11 AM Pager: [phone]

## 2018-10-17 NOTE — Telephone Encounter (Signed)
Continues as inpt

## 2018-10-18 DIAGNOSIS — Z8249 Family history of ischemic heart disease and other diseases of the circulatory system: Secondary | ICD-10-CM | POA: Diagnosis not present

## 2018-10-18 DIAGNOSIS — E0965 Drug or chemical induced diabetes mellitus with hyperglycemia: Secondary | ICD-10-CM

## 2018-10-18 DIAGNOSIS — Z6841 Body Mass Index (BMI) 40.0 and over, adult: Secondary | ICD-10-CM | POA: Diagnosis not present

## 2018-10-18 DIAGNOSIS — I5032 Chronic diastolic (congestive) heart failure: Secondary | ICD-10-CM | POA: Diagnosis not present

## 2018-10-18 DIAGNOSIS — Z87891 Personal history of nicotine dependence: Secondary | ICD-10-CM | POA: Diagnosis not present

## 2018-10-18 DIAGNOSIS — F419 Anxiety disorder, unspecified: Secondary | ICD-10-CM | POA: Diagnosis not present

## 2018-10-18 DIAGNOSIS — Z823 Family history of stroke: Secondary | ICD-10-CM | POA: Diagnosis not present

## 2018-10-18 DIAGNOSIS — J385 Laryngeal spasm: Secondary | ICD-10-CM | POA: Diagnosis not present

## 2018-10-18 DIAGNOSIS — G4733 Obstructive sleep apnea (adult) (pediatric): Secondary | ICD-10-CM | POA: Diagnosis not present

## 2018-10-18 DIAGNOSIS — Z79899 Other long term (current) drug therapy: Secondary | ICD-10-CM | POA: Diagnosis not present

## 2018-10-18 DIAGNOSIS — Z86711 Personal history of pulmonary embolism: Secondary | ICD-10-CM | POA: Diagnosis not present

## 2018-10-18 DIAGNOSIS — K219 Gastro-esophageal reflux disease without esophagitis: Secondary | ICD-10-CM | POA: Diagnosis present

## 2018-10-18 DIAGNOSIS — E876 Hypokalemia: Secondary | ICD-10-CM | POA: Diagnosis not present

## 2018-10-18 DIAGNOSIS — E669 Obesity, unspecified: Secondary | ICD-10-CM | POA: Diagnosis present

## 2018-10-18 DIAGNOSIS — E785 Hyperlipidemia, unspecified: Secondary | ICD-10-CM | POA: Diagnosis not present

## 2018-10-18 DIAGNOSIS — F17210 Nicotine dependence, cigarettes, uncomplicated: Secondary | ICD-10-CM | POA: Diagnosis present

## 2018-10-18 DIAGNOSIS — I2510 Atherosclerotic heart disease of native coronary artery without angina pectoris: Secondary | ICD-10-CM | POA: Diagnosis not present

## 2018-10-18 DIAGNOSIS — J988 Other specified respiratory disorders: Secondary | ICD-10-CM | POA: Diagnosis present

## 2018-10-18 DIAGNOSIS — J441 Chronic obstructive pulmonary disease with (acute) exacerbation: Secondary | ICD-10-CM | POA: Diagnosis present

## 2018-10-18 DIAGNOSIS — Z833 Family history of diabetes mellitus: Secondary | ICD-10-CM | POA: Diagnosis not present

## 2018-10-18 DIAGNOSIS — R0603 Acute respiratory distress: Secondary | ICD-10-CM | POA: Diagnosis not present

## 2018-10-18 DIAGNOSIS — Z9989 Dependence on other enabling machines and devices: Secondary | ICD-10-CM | POA: Diagnosis not present

## 2018-10-18 DIAGNOSIS — I110 Hypertensive heart disease with heart failure: Secondary | ICD-10-CM | POA: Diagnosis not present

## 2018-10-18 DIAGNOSIS — Z20828 Contact with and (suspected) exposure to other viral communicable diseases: Secondary | ICD-10-CM | POA: Diagnosis present

## 2018-10-18 DIAGNOSIS — Z7982 Long term (current) use of aspirin: Secondary | ICD-10-CM | POA: Diagnosis not present

## 2018-10-18 LAB — BASIC METABOLIC PANEL
Anion gap: 10 (ref 5–15)
BUN: 15 mg/dL (ref 6–20)
CO2: 25 mmol/L (ref 22–32)
Calcium: 9.3 mg/dL (ref 8.9–10.3)
Chloride: 101 mmol/L (ref 98–111)
Creatinine, Ser: 0.99 mg/dL (ref 0.61–1.24)
GFR calc Af Amer: >60 mL/min (ref >60)
GFR calc non Af Amer: >60 mL/min (ref >60)
Glucose, Bld: 187 mg/dL — ABNORMAL HIGH (ref 70–99)
Potassium: 4.6 mmol/L (ref 3.5–5.1)
Sodium: 136 mmol/L (ref 135–145)

## 2018-10-18 LAB — GLUCOSE, CAPILLARY
Glucose-Capillary: 260 mg/dL — ABNORMAL HIGH (ref 70–99)
Glucose-Capillary: 113 mg/dL — ABNORMAL HIGH (ref 70–99)
Glucose-Capillary: 167 mg/dL — ABNORMAL HIGH (ref 70–99)
Glucose-Capillary: 161 mg/dL — ABNORMAL HIGH (ref 70–99)
Glucose-Capillary: 183 mg/dL — ABNORMAL HIGH (ref 70–99)

## 2018-10-18 LAB — TRYPTASE: Tryptase: 9 ug/L (ref 2.2–13.2)

## 2018-10-18 MED ORDER — PANTOPRAZOLE SODIUM 40 MG PO TBEC
40.0000 mg | DELAYED_RELEASE_TABLET | Freq: Two times a day (BID) | ORAL | Status: DC
  Administered 2018-10-18 – 2018-10-19 (×2): 40 mg via ORAL
  Filled 2018-10-18 (×2): qty 1

## 2018-10-18 MED ORDER — FUROSEMIDE 40 MG PO TABS
40.0000 mg | ORAL_TABLET | Freq: Every day | ORAL | Status: DC
  Administered 2018-10-18 – 2018-10-19 (×2): 40 mg via ORAL
  Filled 2018-10-18 (×2): qty 1

## 2018-10-18 NOTE — Progress Notes (Signed)
Responded to spiritual care consult. Pt was alert and just finished his lunch. Mr. Wilbur was sitting up in his chair. He wanted to know about an AD. I gave Mr. Minette a copy and an overview. He stated he was going to get his daughter to be the agent. I offered an overview of filling it out. He mentioned his love for those in the [place] and his family.  I offered spiritual care with words of encouragement, empathic listening, and ministry of presence. Mr. Vielman emphasized the good care he has received as a patient. Chaplain available as needed.   [place] [phone]

## 2018-10-18 NOTE — Progress Notes (Signed)
  Speech Language Pathology Treatment: Dysphagia  Patient Details Name: Russell Collier MRN: [id number] DOB: July 27, 1960 Today's Date: 10/18/2018 Time: [id number] SLP Time Calculation (min) (ACUTE ONLY): 51 min  Assessment / Plan / Recommendation Clinical Impression  Pt initially presented with better RR and WOB today compared to yesterday. He was eating regular solids and thin liquids, occasionally with c/o globus sensation and then exhibiting a delayed cough that was productive of sputum but no evidence of POs. This was reduced with Min cues for smaller boluses. He described more about his hx with reflux - he was diagnosed in 21 and had been told in the past that the lining of his esophagus was irritated from the amount of reflux and vomiting he had. He has taken a variety of medications of the years, with the protonix feeling like it works the best. However, he continues to share with me that he still gets frequent heartburn and at times he feels like he refluxes up into his throat, which causes him to feel choked.  As pt began talking about his current globus sensation and fear of having another laryngospasm at home, pt suddenly became very anxious with increased rate and work of breathing, breathing so quickly that he could not get words out. He looked generally fearful and began trying to make himself cough and expectorate into the trash can. SLP provided Min cues for use of breathing exercises from previous date, which helped to dissipate his symptoms fairly quickly. He told me that he feels very scared about what is happening to him.   SLP called MD to share findings from today's session - it appeared as though his real physical symptoms are triggering a more anxiety-driven episode. Given that his symptoms that appear to be related to reflux seem to be a causal factor, recommend considering esophagram while inpatient, or at least reassess current management of reflux. SLP will continue to follow  acutely for increased use of strategies and education.   HPI HPI: Pt is a 58 yo male admitted with SOB with associated globus sensation. He was recently admitted 8/23-8/24 with question of laryngospasm. Per MD note, ENT is being recommended for OP evaluation and there is question of LPR. PMH includes: tobacco abuse, PE, OSA, HTN, HLD, dyspepsia, cocaine use, CHF, CAD      SLP Plan  Continue with current plan of care       Recommendations  Diet recommendations: Regular;Thin liquid Liquids provided via: Cup;Straw Medication Administration: Whole meds with liquid Supervision: Patient able to self feed Compensations: Slow rate;Small sips/bites;Follow solids with liquid Postural Changes and/or Swallow Maneuvers: Seated upright 90 degrees;Upright 30-60 min after meal                Oral Care Recommendations: Oral care BID Follow up Recommendations: Outpatient SLP;Other (comment)(potentially, pending ENT results) SLP Visit Diagnosis: Dysphagia, unspecified (R13.10) Plan: Continue with current plan of care       GO                Venita Sheffield Alic [name] 10/18/2018, 12:11 PM  Pollyann Glen, M.A. [place] Acute Environmental education officer [phone] Office [phone]

## 2018-10-18 NOTE — Telephone Encounter (Signed)
Continues to be inpt.

## 2018-10-18 NOTE — Progress Notes (Addendum)
Explained PFT to patient.  Pt also asked if he had COPD. RN explained that he has not been diagnosed from what I can see but will ask MD and we will learn more after the PFT.  Pt stated niece having questions, RN will call niece for updates.

## 2018-10-18 NOTE — Progress Notes (Signed)
   Subjective:  [name] was seen at bedside. He states that he slept okay last night. He states that his father and uncle have passed due to lung disease. He had no events overnight. We discussed the next steps in his care. All questions and concerns were addressed  Objective:  Vital signs in last 24 hours: Vitals:   10/17/18 1452 10/17/18 1502 10/17/18 1955 10/17/18 2307  BP:  121/60  134/70  Pulse:  68  (!) 59  Resp: 17 17    Temp:  98.2 F (36.8 C)  97.6 F (36.4 C)  TempSrc:  Oral  Oral  SpO2:  98% 98% 100%  Weight:      Height:       Physical Exam: General: Well developed and well nourished, alert and oriented, cooperative. Head: Normocephalic, atraumatic.  Cardiac: RRR, Normal S1 and S2, no murmurs, rubs, or gallops.  Abdomen: + bowel sounds, soft, non distended.  Pulmonary: Clear to auscultation bilaterally, faint wheezes auscultated bilaterally, no rales, or rhonchi.   Assessment/Plan:  Principal Problem:   Acute respiratory distress Active Problems:   Laryngeal spasm  Acute Respiratory Distress 2/2 inducible laryngeal spasm versus COPD/asthma:    - Continue prednisone 40mg  QD.  - Continue Protonix 40 mg.  - PFT's with methacholine challenge: Pending.  - Continue duonebs. Consider heliox treatment. - SLP for eval, treat and possibly provide some education from their perspective. - Consider outpatient ENT referral.  Hypokalemia:  - K: 3.2 (10/17/2018) Today's BMP pending.   Chronic Diastolic Heart Failure (EF 48%) - Continue 40mg  Lasix QD.   CAD/hyperlipidemia:  - Continue Asprin 81mg .  - Lipitor 80mg  QD.  Hyperglycemia 2/2 to Steroid Use and Uncontrolled Diabetes: - Glucose: 234 - A1C: 6.1% - SSI    HTN:  - Continue enalapril 20mg  BID.   OSA: - Continue CPAP.  Anxiety:  - Continue Hydroxyzine 10 mg BID PRN. - Continue Sertraline 25 mg QD.   Dispo: Anticipated discharge pending medical course.   Maudie Mercury, MD 10/18/2018, 6:53 AM  Pager: [phone]

## 2018-10-18 NOTE — Progress Notes (Signed)
Pt has orders for a STAT PFT. Resp therapy called to arrange test & stated unable to do test until tomorrow morning d/t certain staff not being available until morning.  MD paged

## 2018-10-18 NOTE — Plan of Care (Signed)

## 2018-10-19 ENCOUNTER — Other Ambulatory Visit: Payer: Self-pay | Admitting: Internal Medicine

## 2018-10-19 ENCOUNTER — Inpatient Hospital Stay (HOSPITAL_COMMUNITY): Payer: Medicaid Other

## 2018-10-19 ENCOUNTER — Other Ambulatory Visit (HOSPITAL_COMMUNITY): Payer: Self-pay | Admitting: Respiratory Therapy

## 2018-10-19 LAB — BASIC METABOLIC PANEL
Anion gap: 9 (ref 5–15)
BUN: 15 mg/dL (ref 6–20)
CO2: 27 mmol/L (ref 22–32)
Calcium: 9.1 mg/dL (ref 8.9–10.3)
Chloride: 102 mmol/L (ref 98–111)
Creatinine, Ser: 0.91 mg/dL (ref 0.61–1.24)
GFR calc Af Amer: >60 mL/min (ref >60)
GFR calc non Af Amer: >60 mL/min (ref >60)
Glucose, Bld: 153 mg/dL — ABNORMAL HIGH (ref 70–99)
Potassium: 3.7 mmol/L (ref 3.5–5.1)
Sodium: 138 mmol/L (ref 135–145)

## 2018-10-19 LAB — PULMONARY FUNCTION TEST
DL/VA % pred: 140 %
DL/VA: 5.92 ml/min/mmHg/L
DLCO cor % pred: 86 %
DLCO cor: 24.65 ml/min/mmHg
DLCO unc % pred: 82 %
DLCO unc: 23.39 ml/min/mmHg
FEF 25-75 Pre: 2.21 L/sec
FEF2575-%Pred-Pre: 73 %
FEV1-%Pred-Pre: 64 %
FEV1-Pre: 2.11 L
FEV1FVC-%Pred-Pre: 102 %
FEV6-%Pred-Pre: 65 %
FEV6-Pre: 2.62 L
FEV6FVC-%Pred-Pre: 103 %
FVC-%Pred-Pre: 62 %
FVC-Pre: 2.62 L
Pre FEV1/FVC ratio: 81 %
Pre FEV6/FVC Ratio: 100 %

## 2018-10-19 LAB — GLUCOSE, CAPILLARY
Glucose-Capillary: 94 mg/dL (ref 70–99)
Glucose-Capillary: 148 mg/dL — ABNORMAL HIGH (ref 70–99)

## 2018-10-19 MED ORDER — SERTRALINE HCL 25 MG PO TABS: 25.0000 mg | ORAL_TABLET | Freq: Every day | ORAL | 0 refills | Status: DC

## 2018-10-19 MED ORDER — POTASSIUM CHLORIDE CRYS ER 20 MEQ PO TBCR
40.0000 meq | EXTENDED_RELEASE_TABLET | Freq: Once | ORAL | Status: AC
  Administered 2018-10-19: 40 meq via ORAL
  Filled 2018-10-19: qty 2

## 2018-10-19 MED ORDER — HYDROXYZINE HCL 10 MG PO TABS: 10.0000 mg | ORAL_TABLET | Freq: Two times a day (BID) | ORAL | 0 refills | Status: DC | PRN

## 2018-10-19 MED ORDER — PANTOPRAZOLE SODIUM 40 MG PO TBEC: 40.0000 mg | DELAYED_RELEASE_TABLET | Freq: Two times a day (BID) | ORAL | 0 refills | Status: DC

## 2018-10-19 NOTE — Progress Notes (Signed)
Orthopedic Tech Progress Note Patient Details:  Russell Collier 1960/06/12 [id number]  Ortho Devices Type of Ortho Device: Louretta Parma boot Ortho Device/Splint Location: (B) LE Ortho Device/Splint Interventions: Ordered, Application   Post Interventions Patient Tolerated: Well Instructions Provided: Care of device   [name] 10/19/2018, 1:24 PM

## 2018-10-19 NOTE — Progress Notes (Signed)
CSW contacted by RN about patient needing a taxi voucher. CSW provided voucher for patient to go home.  Laveda Abbe, [place] Clinical Social Worker [phone]

## 2018-10-19 NOTE — Progress Notes (Signed)
   Subjective:  Mr. Mcquerry was seen at bedside. He states that he did not have any events overnight, and that his speech therapy sessions have helped him with his biopsy. He states that he is eating his food with no coughing or choking. He did state that he had a nosebleed today as well as an episode of vomiting after his potassium pill, but he is otherwise fine All questions and concerns were addressed.   Objective:  Vital signs in last 24 hours: Vitals:   10/18/18 2314 10/19/18 0320 10/19/18 0820 10/19/18 0829  BP: 139/68   (!) 156/82  Pulse: 63   71  Resp: 20     Temp: 98 F (36.7 C)   98.2 F (36.8 C)  TempSrc: Oral   Oral  SpO2: 99%  99% 100%  Weight:  (!) 147.1 kg    Height:       Physical Exam: General: Well developed and well nourished, alert and oriented, cooperative. Head: Normocephalic, atraumatic.  Cardiac: RRR, Normal S1 and S2, no murmurs, rubs, or gallops.  Abdomen: + bowel sounds, soft, non distended.  Pulmonary: Clear to auscultation bilaterally,  no wheezes, rales, or rhonchi.   Assessment/Plan:  Principal Problem:   Acute respiratory distress Active Problems:   Laryngeal spasm  Acute Respiratory Distress 2/2 inducible laryngeal spasm versus COPD/asthma:    - Continue prednisone 40mg  daily - Increase to Protonix 40 mg BID.  - PFT today with possible methacholine challenge tomorrow.  - Continue duonebs. Consider heliox treatment. - SLP for eval, treat and possibly provide some education from their perspective. - Consider outpatient ENT referral.  Hypokalemia:  - K: 3.7 - Potassium Chloride 40 mg tab.    Chronic Diastolic heart failure (EF 48%) - Continue 40mg  Lasix. - Strict I/O. - Check daily weights.    CAD/hyperlipidemia:  - Continue Asprin 81mg .  - Lipitor 80mg  QD.   HTN.  - Continue enalapril 20mg  BID.   OSA - Continue CPAP.  Anxiety:  - Hydroxyzine 10 mg BID PRN. - Sertraline 25 mg QD.   Dispo: Anticipated discharge pending  medical course.   Maudie Mercury, MD 10/19/2018, 9:55 AM Pager: [phone]
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
## 2018-10-20 ENCOUNTER — Other Ambulatory Visit (HOSPITAL_COMMUNITY): Payer: Medicaid Other

## 2018-10-20 LAB — CULTURE, BLOOD (ROUTINE X 2)
Culture: NO GROWTH
Culture: NO GROWTH
Special Requests: ADEQUATE
Special Requests: ADEQUATE

## 2018-10-23 ENCOUNTER — Ambulatory Visit: Payer: Medicaid Other

## 2018-10-23 NOTE — Discharge Summary (Addendum)
Name: Russell Collier MRN: [id number] DOB: 07/16/1960 58 y.o. PCP: [facility]  Date of Admission: 10/16/2018  8:43 PM Date of Discharge: 10/19/2018 Attending Physician: No att. providers found  Discharge Diagnosis: 1. Acute Respiratory Failure 2/2 to Laryngeal Spasm  Discharge Medications: Allergies as of 10/19/2018   No Known Allergies      Medication List     STOP taking these medications    predniSONE 20 MG tablet Commonly known as: DELTASONE       TAKE these medications    albuterol (2.5 MG/3ML) 0.083% nebulizer solution Commonly known as: PROVENTIL Take 2.5 mg by nebulization every 6 (six) hours as needed for wheezing or shortness of breath.   albuterol 108 (90 Base) MCG/ACT inhaler Commonly known as: VENTOLIN HFA Inhale 2 puffs into the lungs every 4 (four) hours as needed for wheezing or shortness of breath.   aspirin 325 MG tablet Take 325 mg by mouth daily.   atorvastatin 80 MG tablet Commonly known as: Lipitor Take 1 tablet (80 mg total) by mouth daily.   enalapril 20 MG tablet Commonly known as: VASOTEC Take 20 mg by mouth 2 (two) times daily.   furosemide 40 MG tablet Commonly known as: LASIX Take 1 tablet (40 mg total) by mouth daily. What changed: when to take this   hydrOXYzine 10 MG tablet Commonly known as: ATARAX/VISTARIL Take 1 tablet (10 mg total) by mouth 2 (two) times daily as needed for anxiety.   ibuprofen 200 MG tablet Commonly known as: ADVIL Take 400 mg by mouth every 6 (six) hours as needed for headache (pain).   multivitamin with minerals Tabs tablet Take 1 tablet by mouth daily.   pantoprazole 40 MG tablet Commonly known as: PROTONIX Take 1 tablet (40 mg total) by mouth 2 (two) times daily. What changed: when to take this   sertraline 25 MG tablet Commonly known as: ZOLOFT Take 1 tablet (25 mg total) by mouth daily.   spironolactone 25 MG tablet Commonly known as: ALDACTONE Take 0.5 tablets (12.5 mg  total) by mouth daily.   valACYclovir 500 MG tablet Commonly known as: VALTREX Take 500 mg by mouth daily.        Disposition and follow-up:   Mr.Russell Collier was discharged from [facility] in Stable condition.  At the hospital follow up visit please address:  1.  ENT referral, optimize medications, patient education on anxiety medications.   2.  Labs / imaging needed at time of follow-up: N/A  3.  Pending labs/ test needing follow-up: N/A  Follow-up Appointments:  [facility]: October 23, 2018 at 10:00 a.m.  Hospital Course by problem list: Acute Respiratory Failure 2/2 to Laryngeal Spasm: After being discharged on 10/16/2018, [name] returned home for approximately 2 hours before his throat, "Began to close up and I was making weird noises." He stated that he has not been wheezing, and points at his throat when asked where he thought the issue was occurring.   During his ER course, per the patient, an employee visualized his vocal chords and noted that they were red. His history and our physical exam aligned with an upper airway obstruction. To definitively rule out lower vs upper airway obstruction PFTs were obtained and showed that he had moderate obstructive airway disease. SLP was consulted to teach him breathing techniques.   He did state that he is "tasting" his GERD. We increased his Protonix to 40 mg BID. For his  anxiety, he was also started on sertraline 25 mg as it is speculated there is an anxiety component to his condition. He was discharged with an appointment to the internal medicine outpatient clinic for a referral to ENT for outpatient evaluation.     Discharge Vitals:   BP (!) 156/82 (BP Location: Right Arm)   Pulse 71   Temp 98.2 F (36.8 C) (Oral)   Resp 20   Ht 5\' 11"  (1.803 m)   Wt (!) 147.1 kg   SpO2 98%   BMI 45.23 kg/m   Pertinent Labs, Studies, and Procedures: Pulmonary Function  Tests:  Diagnosis: sob Dyspnea: After any exertion Cough: Productive Wheeze: Frequent Tbco Prod: Cigarette Yrs Smk: 50.0 Pks/Day: 1.5 Yrs Quit: Medications: inhalers and neb Pre Test Comments: Post Test Comments: Good patient effort. The results of this test meets ATS standards for acceptability and repeatability. HHN given with 2.5mg  of Albuterol Hgb value 12.9g/dL as of 10/12/2018 taken from an CBC. Neb tx was given to pt at 08:19. No post FVCs done at with this study. Unable to do Pleth portion of the study due to claustrapobia Pre-Bronch Post-Bronch Actual Pred %Pred Actual %Pred %Chng ---- SPIROMETRY ---- FVC (L) 2.62 4.17 62 FEV1 (L) 2.11 3.25 64 FEV1/FVC (%) 81 78 102 FEV6 (L) 2.62 4.02 65 FEV1/FEV6 (%) 81 81 99 FEF 25-75% (L/sec) 2.21 3.01 73 FEF Max (L/sec) 4.55 8.67 52 FIVC (L) 2.65 FIF Max (L/sec) 3.44 Back Extrap Vol (L) 0.12 ---- LUNG VOLUMES ---- SVC (L) 2.69 4.17 64 IC (L) 2.27 3.43 66 ERV (L) 0.42 0.73 57 ---- DIFFUSION ---- DLCOunc (ml/min/mmHg) 23.39 28.49 82 DLCOcor (ml/min/mmHg) 24.65 28.49 86 DL/VA (ml/min/mmHg/L) 5.92 4.21 140 VA (L) 4.16 6.76 61 Hgb (gm/dL) 12.9 12-18 Although the FEV1 and FVC are reduced, the FEV1/FVC ratio is increased. The slow vital capacity is reduced. The diffusing capacity is high for the measured volume. Conclusions: Pulmonary Function Diagnosis: Moderate Obstructive Airways Disease  Discharge Instructions: Discharge Instructions     Diet - low sodium heart healthy   Complete by: As directed    Discharge instructions   Complete by: As directed    Thank you for choosing [facility] for your medical needs. You were discharged with zoloft and Atarax/Vistaril for anxiety. Please follow the instructions as directed. Additionally we have updated you Protonix to 40 mg two times a day. Please follow the instructions as directed. If you notice shortness of breath, trouble breathing, chest pain with radiation, or general worsening of  your current state, please come back for reevaluation.   Increase activity slowly   Complete by: As directed        Signed: Maudie Mercury, MD 10/23/2018, 6:53 PM   Pager: [phone]

## 2018-10-24 ENCOUNTER — Telehealth: Payer: Self-pay

## 2018-10-24 DIAGNOSIS — G4733 Obstructive sleep apnea (adult) (pediatric): Secondary | ICD-10-CM | POA: Diagnosis not present

## 2018-10-24 NOTE — Telephone Encounter (Signed)
CALLED PT AND WENT OVER MEDS FOR APPT ON 10/26/18
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
## 2018-10-26 ENCOUNTER — Telehealth (INDEPENDENT_AMBULATORY_CARE_PROVIDER_SITE_OTHER): Payer: Medicaid Other | Admitting: Cardiology

## 2018-10-26 ENCOUNTER — Other Ambulatory Visit: Payer: Self-pay

## 2018-10-26 ENCOUNTER — Encounter (HOSPITAL_BASED_OUTPATIENT_CLINIC_OR_DEPARTMENT_OTHER): Payer: Medicaid Other | Attending: Internal Medicine

## 2018-10-26 DIAGNOSIS — G4730 Sleep apnea, unspecified: Secondary | ICD-10-CM | POA: Diagnosis not present

## 2018-10-26 DIAGNOSIS — I509 Heart failure, unspecified: Secondary | ICD-10-CM | POA: Diagnosis not present

## 2018-10-26 DIAGNOSIS — I87312 Chronic venous hypertension (idiopathic) with ulcer of left lower extremity: Secondary | ICD-10-CM | POA: Diagnosis not present

## 2018-10-26 DIAGNOSIS — I110 Hypertensive heart disease with heart failure: Secondary | ICD-10-CM | POA: Diagnosis not present

## 2018-10-26 DIAGNOSIS — L97822 Non-pressure chronic ulcer of other part of left lower leg with fat layer exposed: Secondary | ICD-10-CM | POA: Diagnosis not present

## 2018-10-26 DIAGNOSIS — I87332 Chronic venous hypertension (idiopathic) with ulcer and inflammation of left lower extremity: Secondary | ICD-10-CM | POA: Insufficient documentation

## 2018-10-26 DIAGNOSIS — I10 Essential (primary) hypertension: Secondary | ICD-10-CM

## 2018-10-26 DIAGNOSIS — I2510 Atherosclerotic heart disease of native coronary artery without angina pectoris: Secondary | ICD-10-CM | POA: Diagnosis not present

## 2018-10-26 DIAGNOSIS — Z6841 Body Mass Index (BMI) 40.0 and over, adult: Secondary | ICD-10-CM | POA: Diagnosis not present

## 2018-10-26 DIAGNOSIS — G4733 Obstructive sleep apnea (adult) (pediatric): Secondary | ICD-10-CM

## 2018-10-26 NOTE — Patient Instructions (Signed)
Medication Instructions:  Your physician recommends that you continue on your current medications as directed. Please refer to the Current Medication list given to you today.  If you need a refill on your cardiac medications before your next appointment, please call your pharmacy.   Lab work: None ordered If you have labs (blood work) drawn today and your tests are completely normal, you will receive your results only by: Marland Kitchen MyChart Message (if you have MyChart) OR . A paper copy in the mail If you have any lab test that is abnormal or we need to change your treatment, we will call you to review the results.  Testing/Procedures: None ordered  Follow-Up: At [facility], you and your health needs are our priority.  As part of our continuing mission to provide you with exceptional heart care, we have created designated Provider Care Teams.  These Care Teams include your primary Cardiologist (physician) and Advanced Practice Providers (APPs -  Physician Assistants and Nurse Practitioners) who all work together to provide you with the care you need, when you need it. You will need a follow up appointment in 4 weeks VIRTUAL FOR SLEEP with Dr. Fransico Him.   Any Other Special Instructions Will Be Listed Below (If Applicable).

## 2018-10-27 ENCOUNTER — Other Ambulatory Visit: Payer: Self-pay

## 2018-10-30 DIAGNOSIS — R600 Localized edema: Secondary | ICD-10-CM | POA: Diagnosis not present

## 2018-10-30 DIAGNOSIS — R0602 Shortness of breath: Secondary | ICD-10-CM | POA: Diagnosis not present

## 2018-10-30 DIAGNOSIS — R079 Chest pain, unspecified: Secondary | ICD-10-CM | POA: Diagnosis not present

## 2018-10-30 DIAGNOSIS — R072 Precordial pain: Secondary | ICD-10-CM | POA: Diagnosis not present

## 2018-10-30 DIAGNOSIS — J385 Laryngeal spasm: Secondary | ICD-10-CM | POA: Diagnosis not present

## 2018-10-31 ENCOUNTER — Other Ambulatory Visit: Payer: Self-pay

## 2018-10-31 ENCOUNTER — Emergency Department (HOSPITAL_COMMUNITY): Payer: Medicaid Other

## 2018-10-31 ENCOUNTER — Emergency Department (HOSPITAL_COMMUNITY)
Admission: EM | Admit: 2018-10-31 | Discharge: 2018-10-31 | Disposition: A | Discharge status: Home / Self Care | Payer: Medicaid Other | Attending: Emergency Medicine | Admitting: Emergency Medicine

## 2018-10-31 ENCOUNTER — Encounter (HOSPITAL_COMMUNITY): Payer: Self-pay | Admitting: Emergency Medicine

## 2018-10-31 ENCOUNTER — Telehealth: Payer: Self-pay | Admitting: Cardiology

## 2018-10-31 ENCOUNTER — Emergency Department (HOSPITAL_COMMUNITY)
Admission: EM | Admit: 2018-10-31 | Discharge: 2018-11-01 | Disposition: A | Discharge status: Home / Self Care | Payer: Medicaid Other | Source: Home / Self Care | Attending: Emergency Medicine | Admitting: Emergency Medicine

## 2018-10-31 DIAGNOSIS — R531 Weakness: Secondary | ICD-10-CM | POA: Diagnosis not present

## 2018-10-31 DIAGNOSIS — R0602 Shortness of breath: Secondary | ICD-10-CM | POA: Diagnosis not present

## 2018-10-31 DIAGNOSIS — R0789 Other chest pain: Secondary | ICD-10-CM

## 2018-10-31 DIAGNOSIS — R062 Wheezing: Secondary | ICD-10-CM | POA: Insufficient documentation

## 2018-10-31 DIAGNOSIS — R0989 Other specified symptoms and signs involving the circulatory and respiratory systems: Secondary | ICD-10-CM | POA: Diagnosis not present

## 2018-10-31 DIAGNOSIS — R072 Precordial pain: Secondary | ICD-10-CM | POA: Diagnosis not present

## 2018-10-31 DIAGNOSIS — I499 Cardiac arrhythmia, unspecified: Secondary | ICD-10-CM | POA: Diagnosis not present

## 2018-10-31 DIAGNOSIS — R070 Pain in throat: Secondary | ICD-10-CM | POA: Diagnosis not present

## 2018-10-31 DIAGNOSIS — I2510 Atherosclerotic heart disease of native coronary artery without angina pectoris: Secondary | ICD-10-CM | POA: Insufficient documentation

## 2018-10-31 DIAGNOSIS — R079 Chest pain, unspecified: Secondary | ICD-10-CM | POA: Diagnosis not present

## 2018-10-31 DIAGNOSIS — I110 Hypertensive heart disease with heart failure: Secondary | ICD-10-CM | POA: Insufficient documentation

## 2018-10-31 DIAGNOSIS — R600 Localized edema: Secondary | ICD-10-CM | POA: Diagnosis not present

## 2018-10-31 DIAGNOSIS — Z79899 Other long term (current) drug therapy: Secondary | ICD-10-CM | POA: Insufficient documentation

## 2018-10-31 DIAGNOSIS — J385 Laryngeal spasm: Secondary | ICD-10-CM | POA: Diagnosis not present

## 2018-10-31 DIAGNOSIS — I5032 Chronic diastolic (congestive) heart failure: Secondary | ICD-10-CM | POA: Diagnosis not present

## 2018-10-31 DIAGNOSIS — F17210 Nicotine dependence, cigarettes, uncomplicated: Secondary | ICD-10-CM | POA: Insufficient documentation

## 2018-10-31 DIAGNOSIS — Z7982 Long term (current) use of aspirin: Secondary | ICD-10-CM | POA: Insufficient documentation

## 2018-10-31 DIAGNOSIS — I4510 Unspecified right bundle-branch block: Secondary | ICD-10-CM | POA: Diagnosis not present

## 2018-10-31 DIAGNOSIS — I259 Chronic ischemic heart disease, unspecified: Secondary | ICD-10-CM | POA: Insufficient documentation

## 2018-10-31 DIAGNOSIS — E1165 Type 2 diabetes mellitus with hyperglycemia: Secondary | ICD-10-CM | POA: Diagnosis not present

## 2018-10-31 LAB — BASIC METABOLIC PANEL
Anion gap: 12 (ref 5–15)
BUN: 15 mg/dL (ref 6–20)
CO2: 23 mmol/L (ref 22–32)
Calcium: 9.3 mg/dL (ref 8.9–10.3)
Chloride: 103 mmol/L (ref 98–111)
Creatinine, Ser: 1.09 mg/dL (ref 0.61–1.24)
GFR calc Af Amer: >60 mL/min (ref >60)
GFR calc non Af Amer: >60 mL/min (ref >60)
Glucose, Bld: 254 mg/dL — ABNORMAL HIGH (ref 70–99)
Potassium: 3.5 mmol/L (ref 3.5–5.1)
Sodium: 138 mmol/L (ref 135–145)

## 2018-10-31 LAB — CBC
HCT: 39.6 % (ref 39.0–52.0)
HCT: 40.2 % (ref 39.0–52.0)
Hemoglobin: 13.3 g/dL (ref 13.0–17.0)
Hemoglobin: 13.5 g/dL (ref 13.0–17.0)
MCH: 29.4 pg (ref 26.0–34.0)
MCH: 29.3 pg (ref 26.0–34.0)
MCHC: 33.6 g/dL (ref 30.0–36.0)
MCHC: 33.6 g/dL (ref 30.0–36.0)
MCV: 87.6 fL (ref 80.0–100.0)
MCV: 87.2 fL (ref 80.0–100.0)
Platelets: 250 10*3/uL (ref 150–400)
Platelets: 246 10*3/uL (ref 150–400)
RBC: 4.52 MIL/uL (ref 4.22–5.81)
RBC: 4.61 MIL/uL (ref 4.22–5.81)
RDW: 14.7 % (ref 11.5–15.5)
RDW: 14.6 % (ref 11.5–15.5)
WBC: 10.2 10*3/uL (ref 4.0–10.5)
WBC: 11.6 10*3/uL — ABNORMAL HIGH (ref 4.0–10.5)
nRBC: 0 % (ref 0.0–0.2)
nRBC: 0 % (ref 0.0–0.2)

## 2018-10-31 LAB — BASIC METABOLIC PANEL WITH GFR
Anion gap: 12 (ref 5–15)
BUN: 14 mg/dL (ref 6–20)
CO2: 20 mmol/L — ABNORMAL LOW (ref 22–32)
Calcium: 8.9 mg/dL (ref 8.9–10.3)
Chloride: 105 mmol/L (ref 98–111)
Creatinine, Ser: 0.97 mg/dL (ref 0.61–1.24)
GFR calc Af Amer: >60 mL/min
GFR calc non Af Amer: >60 mL/min
Glucose, Bld: 215 mg/dL — ABNORMAL HIGH (ref 70–99)
Potassium: 3.4 mmol/L — ABNORMAL LOW (ref 3.5–5.1)
Sodium: 137 mmol/L (ref 135–145)

## 2018-10-31 LAB — TROPONIN I (HIGH SENSITIVITY)
Troponin I (High Sensitivity): 24 ng/L — ABNORMAL HIGH (ref <18)
Troponin I (High Sensitivity): 21 ng/L — ABNORMAL HIGH
Troponin I (High Sensitivity): 21 ng/L — ABNORMAL HIGH (ref <18)

## 2018-10-31 LAB — CBG MONITORING, ED
Glucose-Capillary: 246 mg/dL — ABNORMAL HIGH (ref 70–99)
Glucose-Capillary: 257 mg/dL — ABNORMAL HIGH (ref 70–99)

## 2018-10-31 MED ORDER — ALBUTEROL SULFATE HFA 108 (90 BASE) MCG/ACT IN AERS
6.0000 | INHALATION_SPRAY | Freq: Once | RESPIRATORY_TRACT | Status: AC
  Administered 2018-10-31: 6 via RESPIRATORY_TRACT
  Filled 2018-10-31: qty 6.7

## 2018-10-31 MED ORDER — AEROCHAMBER PLUS FLO-VU MEDIUM MISC
1.0000 | Freq: Once | Status: DC
  Filled 2018-10-31: qty 1

## 2018-10-31 MED ORDER — PREDNISONE 20 MG PO TABS
40.0000 mg | ORAL_TABLET | Freq: Once | ORAL | Status: AC
  Administered 2018-10-31: 14:00:00 40 mg via ORAL
  Filled 2018-10-31: qty 2

## 2018-10-31 MED ORDER — FUROSEMIDE 40 MG PO TABS: 40.0000 mg | ORAL_TABLET | Freq: Two times a day (BID) | ORAL | 3 refills | Status: DC

## 2018-10-31 MED ORDER — SODIUM CHLORIDE 0.9% FLUSH: 3.0000 mL | Freq: Once | INTRAVENOUS | Status: DC

## 2018-10-31 NOTE — Telephone Encounter (Signed)
That is fine 

## 2018-10-31 NOTE — Telephone Encounter (Signed)
Pt calling requesting a refill on furosemide 40 mg tablet BID. This medication was prescribed in the hospital. Would Dr. Radford Pax like to refill this medication? Please address

## 2018-10-31 NOTE — ED Notes (Signed)
Pt eating without issues, taking fluids well.   Will prepare for DC.

## 2018-10-31 NOTE — ED Triage Notes (Signed)
Hx of 6-8 weeks of chest pain and laryngospasm which causes him to have HTN and SOB.   States seen at [facility] yesterday and returns to ED today with similar sx.   Has f/u and presents to the ED wanting relief of sx.  Pt has pressured speech and but in no acute resp distress, stridor or drooling.

## 2018-10-31 NOTE — Discharge Instructions (Addendum)
You were seen in the ER for throat discomfort, shortness of breath, chest pain.  You have been in the ER several times for this.  You have had benign work-up so far in the ER.  Your work-up today was normal.  The throat discomfort may be related to a spasm of your larynx.  This also could be from acid reflux and esophagus irritation.  There are no signs of throat inflammation, infection, allergic reaction.  Your cardiac work-up today was again normal.  You had a little bit of wheezing in your upper lungs.  Your shortness of breath may have been from some COPD wheezing.  Take prednisone 40 mg for the next 5 days.  Monitor your blood glucose during this to make sure it does not get significantly elevated.  Do your breathing treatments every 4 hours to help with the wheezing.  For further discussion of your throat discomfort you need to follow-up with gastroenterology and ENT to determine what the cause of this is.  Given your ongoing chest pain you should follow-up with cardiology for further discussion of this.

## 2018-10-31 NOTE — ED Triage Notes (Signed)
BIB [facility] from home with c/o of same chronic chest pain. Pain does not radiate. Pt seen at [facility] yesterday and discharged with dx of laryngospasms.

## 2018-10-31 NOTE — ED Triage Notes (Signed)
Reports sob and chest tightness.  Patient was seen at [facility] [place] yesterday for same thing  Reports making follow up appointments this morning, but is still uncomfortable.

## 2018-10-31 NOTE — Telephone Encounter (Signed)
°*  STAT* If patient is at the pharmacy, call can be transferred to refill team.   1. Which medications need to be refilled? (please list name of each medication and dose if known) furosemide (LASIX) 40 MG tablet  2. Which pharmacy/location (including street and city if local pharmacy) is medication to be sent to? [place], [place] - [address] AT [address]  3. Do they need a 30 day or 90 day supply? 90 day
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
## 2018-11-01 DIAGNOSIS — I517 Cardiomegaly: Secondary | ICD-10-CM | POA: Diagnosis not present

## 2018-11-01 DIAGNOSIS — R600 Localized edema: Secondary | ICD-10-CM | POA: Diagnosis not present

## 2018-11-01 MED ORDER — GUAIFENESIN ER 600 MG PO TB12: 600.0000 mg | ORAL_TABLET | Freq: Two times a day (BID) | ORAL | 0 refills | Status: DC

## 2018-11-01 MED ORDER — FLUTICASONE PROPIONATE 50 MCG/ACT NA SUSP: 1.0000 | Freq: Every day | NASAL | 2 refills | Status: DC

## 2018-11-01 MED ORDER — METHOCARBAMOL 500 MG PO TABS
500.0000 mg | ORAL_TABLET | Freq: Once | ORAL | Status: AC
  Administered 2018-11-01: 04:00:00 500 mg via ORAL
  Filled 2018-11-01: qty 1

## 2018-11-01 MED ORDER — METHOCARBAMOL 500 MG PO TABS: 500.0000 mg | ORAL_TABLET | Freq: Three times a day (TID) | ORAL | 0 refills | Status: DC | PRN

## 2018-11-01 MED ORDER — GUAIFENESIN ER 600 MG PO TB12
600.0000 mg | ORAL_TABLET | Freq: Once | ORAL | Status: AC
  Administered 2018-11-01: 04:00:00 600 mg via ORAL
  Filled 2018-11-01: qty 1

## 2018-11-01 NOTE — Discharge Instructions (Addendum)
Please follow up with your PCP, cardiologist and ENT as scheduled.  You may alternate Tylenol 1000 mg every 6 hours as needed for pain and Ibuprofen 800 mg every 8 hours as needed for pain.  Please take Ibuprofen with food.
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
## 2018-11-02 ENCOUNTER — Encounter (HOSPITAL_COMMUNITY): Payer: Self-pay | Admitting: Emergency Medicine

## 2018-11-02 ENCOUNTER — Emergency Department (HOSPITAL_COMMUNITY)
Admission: EM | Admit: 2018-11-02 | Discharge: 2018-11-02 | Disposition: A | Discharge status: Home / Self Care | Payer: Medicaid Other | Attending: Emergency Medicine | Admitting: Emergency Medicine

## 2018-11-02 ENCOUNTER — Encounter: Payer: Self-pay | Admitting: Cardiology

## 2018-11-02 ENCOUNTER — Other Ambulatory Visit: Payer: Self-pay

## 2018-11-02 DIAGNOSIS — I110 Hypertensive heart disease with heart failure: Secondary | ICD-10-CM | POA: Diagnosis not present

## 2018-11-02 DIAGNOSIS — R739 Hyperglycemia, unspecified: Secondary | ICD-10-CM | POA: Diagnosis not present

## 2018-11-02 DIAGNOSIS — E1165 Type 2 diabetes mellitus with hyperglycemia: Secondary | ICD-10-CM | POA: Diagnosis not present

## 2018-11-02 DIAGNOSIS — Z7982 Long term (current) use of aspirin: Secondary | ICD-10-CM | POA: Diagnosis not present

## 2018-11-02 DIAGNOSIS — R0600 Dyspnea, unspecified: Secondary | ICD-10-CM | POA: Diagnosis not present

## 2018-11-02 DIAGNOSIS — R42 Dizziness and giddiness: Secondary | ICD-10-CM | POA: Diagnosis not present

## 2018-11-02 DIAGNOSIS — F17210 Nicotine dependence, cigarettes, uncomplicated: Secondary | ICD-10-CM | POA: Insufficient documentation

## 2018-11-02 DIAGNOSIS — I5032 Chronic diastolic (congestive) heart failure: Secondary | ICD-10-CM | POA: Insufficient documentation

## 2018-11-02 DIAGNOSIS — I2510 Atherosclerotic heart disease of native coronary artery without angina pectoris: Secondary | ICD-10-CM | POA: Diagnosis not present

## 2018-11-02 DIAGNOSIS — Z79899 Other long term (current) drug therapy: Secondary | ICD-10-CM | POA: Diagnosis not present

## 2018-11-02 LAB — BASIC METABOLIC PANEL
Anion gap: 7 (ref 5–15)
BUN: 20 mg/dL (ref 6–20)
CO2: 28 mmol/L (ref 22–32)
Calcium: 9 mg/dL (ref 8.9–10.3)
Chloride: 106 mmol/L (ref 98–111)
Creatinine, Ser: 1.15 mg/dL (ref 0.61–1.24)
GFR calc Af Amer: >60 mL/min (ref >60)
GFR calc non Af Amer: >60 mL/min (ref >60)
Glucose, Bld: 197 mg/dL — ABNORMAL HIGH (ref 70–99)
Potassium: 3.6 mmol/L (ref 3.5–5.1)
Sodium: 141 mmol/L (ref 135–145)

## 2018-11-02 LAB — CBC
HCT: 40.2 % (ref 39.0–52.0)
Hemoglobin: 13 g/dL (ref 13.0–17.0)
MCH: 29 pg (ref 26.0–34.0)
MCHC: 32.3 g/dL (ref 30.0–36.0)
MCV: 89.5 fL (ref 80.0–100.0)
Platelets: 243 10*3/uL (ref 150–400)
RBC: 4.49 MIL/uL (ref 4.22–5.81)
RDW: 14.8 % (ref 11.5–15.5)
WBC: 10.1 10*3/uL (ref 4.0–10.5)
nRBC: 0 % (ref 0.0–0.2)

## 2018-11-02 LAB — CBG MONITORING, ED: Glucose-Capillary: 184 mg/dL — ABNORMAL HIGH (ref 70–99)

## 2018-11-02 NOTE — Telephone Encounter (Signed)
error 

## 2018-11-02 NOTE — ED Notes (Signed)
Pt ambulated to bathroom 

## 2018-11-02 NOTE — ED Triage Notes (Signed)
Per EMS-states patient has been recently hospitalized due to laryngospasms-states he has been on prednisone which has elevated his sugar-was seen at [name] today-CBG 223-no history of diabetes

## 2018-11-02 NOTE — Discharge Instructions (Addendum)
With only a couple days left of the steroids, I would not start you on medication for your blood sugar. Once you stop them your glucose levels will come down.

## 2018-11-03 DIAGNOSIS — I110 Hypertensive heart disease with heart failure: Secondary | ICD-10-CM | POA: Diagnosis not present

## 2018-11-03 DIAGNOSIS — L97822 Non-pressure chronic ulcer of other part of left lower leg with fat layer exposed: Secondary | ICD-10-CM | POA: Diagnosis not present

## 2018-11-03 DIAGNOSIS — I2510 Atherosclerotic heart disease of native coronary artery without angina pectoris: Secondary | ICD-10-CM | POA: Diagnosis not present

## 2018-11-03 DIAGNOSIS — G4730 Sleep apnea, unspecified: Secondary | ICD-10-CM | POA: Diagnosis not present

## 2018-11-03 DIAGNOSIS — I509 Heart failure, unspecified: Secondary | ICD-10-CM | POA: Diagnosis not present

## 2018-11-03 DIAGNOSIS — I87332 Chronic venous hypertension (idiopathic) with ulcer and inflammation of left lower extremity: Secondary | ICD-10-CM | POA: Diagnosis not present

## 2018-11-07 NOTE — Telephone Encounter (Signed)
Pt does not answer ph, chart states bethany is his pcp, he is f/u with cardiology, have lm for rtc, closing
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
## 2018-11-09 DIAGNOSIS — L97822 Non-pressure chronic ulcer of other part of left lower leg with fat layer exposed: Secondary | ICD-10-CM | POA: Diagnosis not present

## 2018-11-09 DIAGNOSIS — I87332 Chronic venous hypertension (idiopathic) with ulcer and inflammation of left lower extremity: Secondary | ICD-10-CM | POA: Diagnosis not present

## 2018-11-09 DIAGNOSIS — I2510 Atherosclerotic heart disease of native coronary artery without angina pectoris: Secondary | ICD-10-CM | POA: Diagnosis not present

## 2018-11-09 DIAGNOSIS — I110 Hypertensive heart disease with heart failure: Secondary | ICD-10-CM | POA: Diagnosis not present

## 2018-11-09 DIAGNOSIS — G4730 Sleep apnea, unspecified: Secondary | ICD-10-CM | POA: Diagnosis not present

## 2018-11-09 DIAGNOSIS — I87312 Chronic venous hypertension (idiopathic) with ulcer of left lower extremity: Secondary | ICD-10-CM | POA: Diagnosis not present

## 2018-11-09 DIAGNOSIS — I509 Heart failure, unspecified: Secondary | ICD-10-CM | POA: Diagnosis not present

## 2018-11-14 ENCOUNTER — Ambulatory Visit: Payer: Medicaid Other | Admitting: Speech Pathology

## 2018-11-16 DIAGNOSIS — I509 Heart failure, unspecified: Secondary | ICD-10-CM | POA: Diagnosis not present

## 2018-11-16 DIAGNOSIS — I2510 Atherosclerotic heart disease of native coronary artery without angina pectoris: Secondary | ICD-10-CM | POA: Diagnosis not present

## 2018-11-16 DIAGNOSIS — I87332 Chronic venous hypertension (idiopathic) with ulcer and inflammation of left lower extremity: Secondary | ICD-10-CM | POA: Diagnosis not present

## 2018-11-16 DIAGNOSIS — I110 Hypertensive heart disease with heart failure: Secondary | ICD-10-CM | POA: Diagnosis not present

## 2018-11-16 DIAGNOSIS — I87312 Chronic venous hypertension (idiopathic) with ulcer of left lower extremity: Secondary | ICD-10-CM | POA: Diagnosis not present

## 2018-11-16 DIAGNOSIS — L97822 Non-pressure chronic ulcer of other part of left lower leg with fat layer exposed: Secondary | ICD-10-CM | POA: Diagnosis not present

## 2018-11-16 DIAGNOSIS — G4730 Sleep apnea, unspecified: Secondary | ICD-10-CM | POA: Diagnosis not present

## 2018-11-20 ENCOUNTER — Other Ambulatory Visit: Payer: Self-pay

## 2018-11-20 ENCOUNTER — Ambulatory Visit: Payer: Medicaid Other | Attending: Otolaryngology | Admitting: Speech Pathology

## 2018-11-20 DIAGNOSIS — R498 Other voice and resonance disorders: Secondary | ICD-10-CM | POA: Insufficient documentation
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
## 2018-11-20 NOTE — Patient Instructions (Signed)
  ABDOMINAL BREATHING FOR VCD   . Shoulders down - this is a cue to relax . Place your hand on your abdomen - this helps you focus on easy abdominal breath support - the best and most relaxed way to breathe . Breathe in through your nose and fill your belly with air, watching your hand move outward . Breathe out through your mouth and watch your belly move in. An audible "sh"  may help   Think of your belly as a balloon, when you fill with air (inhale), the balloon gets bigger. As the air goes out (exhale), the balloon deflates.  If you are having difficulty coordinating this, lay on your back with a plastic cup on your belly and repeat the above steps, watching you belly move up with inhalation and down with exhalations  Practice breathing in and out in front of a mirror, watching your belly Breathe in for a count of 5 and breathe out for a count of 5  Now as you breathe out, get a picture of relaxing in your mind Feel the constant in-out of your breathing with your belly Picture the tension in your throat and chest evaporate like steam, melting away and FEEL it do so Picture your throat opening up so wide that a grapefruit or softball could fit through your throat.   Practice this throughout the day when you are not having symptoms. For example: in the car, when watching TV, before medications. Regular practice when you are feeling well is important.  Make it automatic and use it at the first sense of throat tightness to prevent or suppress the VCD. You may start with the inhale or exhale.    Be patient when completing the breathing. It may take several minutes to start feeling relief  Use the breathing to "pre-treat" yourself before a known trigger for VCD. Possible triggers could be: change in air temperature, strong odors, perfume, and exercise.   There's an App for that: [email or web address]  Provided by: Jvon Meroney. SLP [phone]  

## 2018-11-23 ENCOUNTER — Telehealth: Payer: Self-pay | Admitting: *Deleted

## 2018-11-23 ENCOUNTER — Encounter (HOSPITAL_BASED_OUTPATIENT_CLINIC_OR_DEPARTMENT_OTHER): Payer: Medicaid Other | Attending: Internal Medicine | Admitting: Internal Medicine

## 2018-11-23 ENCOUNTER — Other Ambulatory Visit: Payer: Self-pay

## 2018-11-23 DIAGNOSIS — I509 Heart failure, unspecified: Secondary | ICD-10-CM | POA: Diagnosis not present

## 2018-11-23 DIAGNOSIS — L97822 Non-pressure chronic ulcer of other part of left lower leg with fat layer exposed: Secondary | ICD-10-CM | POA: Insufficient documentation

## 2018-11-23 DIAGNOSIS — I87312 Chronic venous hypertension (idiopathic) with ulcer of left lower extremity: Secondary | ICD-10-CM | POA: Diagnosis not present

## 2018-11-23 DIAGNOSIS — G629 Polyneuropathy, unspecified: Secondary | ICD-10-CM | POA: Insufficient documentation

## 2018-11-23 DIAGNOSIS — I110 Hypertensive heart disease with heart failure: Secondary | ICD-10-CM | POA: Insufficient documentation

## 2018-11-23 DIAGNOSIS — I87332 Chronic venous hypertension (idiopathic) with ulcer and inflammation of left lower extremity: Secondary | ICD-10-CM | POA: Diagnosis not present

## 2018-11-23 DIAGNOSIS — G4730 Sleep apnea, unspecified: Secondary | ICD-10-CM | POA: Diagnosis not present

## 2018-11-23 DIAGNOSIS — G4733 Obstructive sleep apnea (adult) (pediatric): Secondary | ICD-10-CM | POA: Diagnosis not present

## 2018-11-23 DIAGNOSIS — I2510 Atherosclerotic heart disease of native coronary artery without angina pectoris: Secondary | ICD-10-CM | POA: Insufficient documentation

## 2018-11-23 NOTE — Telephone Encounter (Signed)

## 2018-11-23 NOTE — Progress Notes (Signed)
[name], Collier ([id number]) Visit Report for 11/23/2018 Debridement Details Patient Name: Date of Service: Russell Collier, Russell Collier 11/23/2018 2:45 PM Medical Record [id number] Patient Account Number: [id number] Date of Birth/Sex: Treating RN: 11-09-1960 (58 y.o. [name] Primary Care Provider: [facility], Romelle Starcher Other Clinician: Referring Provider: Treating Provider/Extender:Jamiya Nims, Peggyann Juba, Romelle Starcher Weeks in Treatment: 10 Debridement Performed for Wound #1 Left,Medial Lower Leg Assessment: Performed By: Physician Ricard Dillon., MD Debridement Type: Debridement Severity of Tissue Pre Fat layer exposed Debridement: Level of Consciousness (Pre- Awake and Alert procedure): Pre-procedure Verification/Time Out Taken: Yes - 15:50 Start Time: 15:51 Pain Control: Lidocaine 4% Topical Solution Total Area Debrided (L x W): 1.8 (cm) x 0.5 (cm) = 0.9 (cm) Tissue and other material Viable, Non-Viable, Slough, Subcutaneous, Skin: Dermis , Fibrin/Exudate, Slough debrided: Level: Skin/Subcutaneous Tissue Debridement Description: Excisional Instrument: Curette Bleeding: Minimum Hemostasis Achieved: Pressure End Time: 15:53 Procedural Pain: 1 Post Procedural Pain: 3 Response to Treatment: Procedure was tolerated well Level of Consciousness Awake and Alert (Post-procedure): Post Debridement Measurements of Total Wound Length: (cm) 1.8 Width: (cm) 0.5 Depth: (cm) 0.1 Volume: (cm) 0.071 Character of Wound/Ulcer Post Improved Debridement: Severity of Tissue Post Debridement: Fat layer exposed Post Procedure Diagnosis Same as Pre-procedure Electronic Signature(s) Signed: 11/23/2018 6:13:48 PM By: Linton Ham MD Signed: 11/23/2018 6:31:45 PM By: Deon Pilling Entered By: Linton Ham on 11/23/2018 15:59:42 -------------------------------------------------------------------------------- HPI Details Patient Name: Date of Service: Russell Collier 11/23/2018 2:45  PM Medical Record [id number] Patient Account Number: [id number] Date of Birth/Sex: Treating RN: [birth year] (58 y.o. [name] Primary Care Provider: Meryle Ready Other Clinician: Referring Provider: Treating Provider/Extender:Kharis Lapenna, Peggyann Juba, Romelle Starcher Weeks in Treatment: 10 History of Present Illness HPI Description: ADMISSION 09/14/2018 This is a 58 year old man who traumatized his left anterior tibia area on the bathtub. He describes this initially as a bruise that subsequently reopened. He has been seen in the ER several times and has had at least 3 courses of antibiotics including doxy and Keflex twice. He has been using Neosporin and peroxide on this. Past medical history includes obstructive sleep apnea, cellulitis, hypertension, coronary artery disease, peripheral neuropathy [nondiabetic], currently on a heart murmur for atypical chest pain and bradycardia. 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 ARTERIAL STUDIES done in March 2020 showed an ABI on the left of 0.93, TBI on the left slightly reduced at 0.42. On the right his ABI was 0.95 with a TBI of 0.51 he has monophasic waveforms in the lower extremities bilaterally. Interpreted on the left this showing mild left lower extremity arterial disease as well as on the right. 09/25/18-Patient returns at 1 week to the clinic, noted that arterial studies done in March 2020. We have been using Iodoflex to the wound on the left leg, patient has significant degree of pain and declines to have any debridement 8/21; the patient has not been here in 3 weeks. Since he was last here he was admitted to hospital on 8/4. He was felt to have stable angina. He has since been to his cardiologist. I note that he has an EF of about 48%. He has a venous wound on the left anterior mid tibia area probably with lymphedema. We have been using Iodoflex. He does not tolerate debridement well. He tells me that his cardiologist has been  adjusting his Lasix and then the wraps fall down he tries to pull them up. He is describing pain making it difficult for him to function. 9/3; patient is was hospitalized since he was last here with COPD exacerbation.  Came in with Aquacel Ag on the wound. The wound actually looks improved. 9/17; patient was apparently in the ER again with breathing issues and blood pressure issues. He was not admitted. We are using Sorbact of the wound with nice improvement dimensions are better 10/1; the patient's wound is smaller and appears to have a reasonable healthy surface. We are using [employer]) Signed: 11/23/2018 6:13:48 PM By: Linton Ham MD Entered By: Linton Ham on 11/23/2018 15:59:06 -------------------------------------------------------------------------------- Physical Exam Details Patient Name: Date of Service: Russell Collier 11/23/2018 2:45 PM Medical Record [id number] Patient Account Number: [id number] Date of Birth/Sex: Treating RN: Jun 26, 1960 (58 y.o. [name] Primary Care Provider: Meryle Ready Other Clinician: Referring Provider: Treating Provider/Extender:Shavar Gorka, [facility], Romelle Starcher Weeks in Treatment: 10 Constitutional Sitting or standing Blood Pressure is within target range for patient.. Pulse regular and within target range for patient.Marland Kitchen Respirations regular, non-labored and within target range.. Temperature is normal and within the target range for the patient.Marland Kitchen Appears in no distress. Eyes Conjunctivae clear. No discharge.no icterus. Cardiovascular Pedal pulses are palpable on the left. Excellent edema control. Notes Exam; left anterior shin the remaining wound area smaller. Using a #3 curette removed tightly adherent fibrinous debris especially from the superior part of the small wound. He tolerates this reasonably well. Hemostasis with direct pressure. There is no evidence of surrounding infection Electronic  Signature(s) Signed: 11/23/2018 6:13:48 PM By: Linton Ham MD Entered By: Linton Ham on 11/23/2018 16:01:28 -------------------------------------------------------------------------------- Physician Orders Details Patient Name: Date of Service: EKER, NEVE 11/23/2018 2:45 PM Medical Record [id number] Patient Account Number: [id number] Date of Birth/Sex: Treating RN: 04-27-1960 (58 y.o. [name] Primary Care Provider: [place], Romelle Starcher Other Clinician: Referring Provider: Treating Provider/Extender:Tarvis Blossom, Peggyann Juba, Jairo Ben in Treatment: 10 Verbal / Phone Orders: No Diagnosis Coding ICD-10 Coding Code Description [phone] Non-pressure chronic ulcer of other part of left lower leg with fat layer exposed S80.12XD Contusion of left lower leg, subsequent encounter I87.322 Chronic venous hypertension (idiopathic) with inflammation of left lower extremity Follow-up Appointments Return Appointment in 1 week. Dressing Change Frequency Wound #1 Left,Medial Lower Leg Do not change entire dressing for one week. Skin Barriers/Peri-Wound Care Wound #1 Left,Medial Lower Leg Moisturizing lotion - mixed with TCA.to leg Wound Cleansing Wound #1 Left,Medial Lower Leg May shower with protection. Primary Wound Dressing Wound #1 Left,Medial Lower Leg Other: - sorbact Secondary Dressing Wound #1 Left,Medial Lower Leg Foam - pad up ankle area for protection. Dry Gauze Edema Control 3 Layer Compression System - Left Lower Extremity - apply unna boot upper portion of lower leg. Avoid standing for long periods of time Elevate legs to the level of the heart or above for 30 minutes daily and/or when sitting, a frequency of: - throughout the day. elevated feet while sitting and working on computer as well. Exercise regularly Additional Orders / Instructions Stop/Decrease Smoking Follow Nutritious Diet - increase protein and vegetables within your cardiac  diet. Electronic Signature(s) Signed: 11/23/2018 6:13:48 PM By: Linton Ham MD Signed: 11/23/2018 6:31:45 PM By: Deon Pilling Entered By: Deon Pilling on 11/23/2018 15:56:41 -------------------------------------------------------------------------------- Problem List Details Patient Name: Date of Service: JYAIRE, RELYEA 11/23/2018 2:45 PM Medical Record [id number] Patient Account Number: [id number] Date of Birth/Sex: Treating RN: 07-28-1960 (58 y.o. [name] Primary Care Provider: Meryle Ready Other Clinician: Referring Provider: Treating Provider/Extender:Julia Alkhatib, [facility], Romelle Starcher Weeks in Treatment: 10 Active Problems ICD-10 Evaluated Encounter Code Description Active Date Today Diagnosis L97.822 Non-pressure chronic ulcer of other part of left lower 09/14/2018  No Yes leg with fat layer exposed S80.12XD Contusion of left lower leg, subsequent encounter 09/14/2018 No Yes I87.322 Chronic venous hypertension (idiopathic) with 09/14/2018 No Yes inflammation of left lower extremity Inactive Problems Resolved Problems Electronic Signature(s) Signed: 11/23/2018 6:13:48 PM By: Linton Ham MD Entered By: Linton Ham on 11/23/2018 15:57:40 -------------------------------------------------------------------------------- Progress Note Details Patient Name: Date of Service: ALDIS, LAMORTE 11/23/2018 2:45 PM Medical Record [id number] Patient Account Number: [id number] Date of Birth/Sex: Treating RN: 10-Apr-1960 (58 y.o. [name] Primary Care Provider: Meryle Ready Other Clinician: Referring Provider: Treating Provider/Extender:Coran Dipaola, Peggyann Juba, Romelle Starcher Weeks in Treatment: 10 Subjective History of Present Illness (HPI) ADMISSION 09/14/2018 This is a 58 year old man who traumatized his left anterior tibia area on the bathtub. He describes this initially as a bruise that subsequently reopened. He has been seen in the ER several times  and has had at least 3 courses of antibiotics including doxy and Keflex twice. He has been using Neosporin and peroxide on this. Past medical history includes obstructive sleep apnea, cellulitis, hypertension, coronary artery disease, peripheral neuropathy [nondiabetic], currently on a heart murmur for atypical chest pain and bradycardia. 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 ARTERIAL STUDIES done in March 2020 showed an ABI on the left of 0.93, TBI on the left slightly reduced at 0.42. On the right his ABI was 0.95 with a TBI of 0.51 he has monophasic waveforms in the lower extremities bilaterally. Interpreted on the left this showing mild left lower extremity arterial disease as well as on the right. 09/25/18-Patient returns at 1 week to the clinic, noted that arterial studies done in March 2020. We have been using Iodoflex to the wound on the left leg, patient has significant degree of pain and declines to have any debridement 8/21; the patient has not been here in 3 weeks. Since he was last here he was admitted to hospital on 8/4. He was felt to have stable angina. He has since been to his cardiologist. I note that he has an EF of about 48%. He has a venous wound on the left anterior mid tibia area probably with lymphedema. We have been using Iodoflex. He does not tolerate debridement well. He tells me that his cardiologist has been adjusting his Lasix and then the wraps fall down he tries to pull them up. He is describing pain making it difficult for him to function. 9/3; patient is was hospitalized since he was last here with COPD exacerbation. Came in with Aquacel Ag on the wound. The wound actually looks improved. 9/17; patient was apparently in the ER again with breathing issues and blood pressure issues. He was not admitted. We are using Sorbact of the wound with nice improvement dimensions are better 10/1; the patient's wound is smaller and appears to have a reasonable healthy  surface. We are using Sorbact Objective Constitutional Sitting or standing Blood Pressure is within target range for patient.. Pulse regular and within target range for patient.Marland Kitchen Respirations regular, non-labored and within target range.. Temperature is normal and within the target range for the patient.Marland Kitchen Appears in no distress. Vitals Time Taken: 3:19 PM, Height: 71 in, Weight: 320 lbs, BMI: 44.6, Temperature: 98.1 F, Pulse: 80 bpm, Respiratory Rate: 16 breaths/min, Blood Pressure: 129/70 mmHg. Eyes Conjunctivae clear. No discharge.no icterus. Cardiovascular Pedal pulses are palpable on the left. Excellent edema control. General Notes: Exam; left anterior shin the remaining wound area smaller. Using a #3 curette removed tightly adherent fibrinous debris especially from the superior part of the small wound.  He tolerates this reasonably well. Hemostasis with direct pressure. There is no evidence of surrounding infection Integumentary (Hair, Skin) Wound #1 status is Open. Original cause of wound was Trauma. The wound is located on the Left,Medial Lower Leg. The wound measures 1.8cm length x 0.5cm width x 0.1cm depth; 0.707cm^2 area and 0.071cm^3 volume. There is Fat Layer (Subcutaneous Tissue) Exposed exposed. There is no tunneling or undermining noted. There is a small amount of sanguinous drainage noted. The wound margin is distinct with the outline attached to the wound base. There is large (67-100%) red, pink granulation within the wound bed. There is a small (1-33%) amount of necrotic tissue within the wound bed including Adherent Slough. Assessment Active Problems ICD-10 Non-pressure chronic ulcer of other part of left lower leg with fat layer exposed Contusion of left lower leg, subsequent encounter Chronic venous hypertension (idiopathic) with inflammation of left lower extremity Procedures Wound #1 Pre-procedure diagnosis of Wound #1 is a Venous Leg Ulcer located on the  Left,Medial Lower Leg .Severity of Tissue Pre Debridement is: Fat layer exposed. There was a Excisional Skin/Subcutaneous Tissue Debridement with a total area of 0.9 sq cm performed by Ricard Dillon., MD. With the following instrument(s): Curette to remove Viable and Non-Viable tissue/material. Material removed includes Subcutaneous Tissue, Slough, Skin: Dermis, and Fibrin/Exudate after achieving pain control using Lidocaine 4% Topical Solution. A time out was conducted at 15:50, prior to the start of the procedure. A Minimum amount of bleeding was controlled with Pressure. The procedure was tolerated well with a pain level of 1 throughout and a pain level of 3 following the procedure. Post Debridement Measurements: 1.8cm length x 0.5cm width x 0.1cm depth; 0.071cm^3 volume. Character of Wound/Ulcer Post Debridement is improved. Severity of Tissue Post Debridement is: Fat layer exposed. Post procedure Diagnosis Wound #1: Same as Pre-Procedure Plan Follow-up Appointments: Return Appointment in 1 week. Dressing Change Frequency: Wound #1 Left,Medial Lower Leg: Do not change entire dressing for one week. Skin Barriers/Peri-Wound Care: Wound #1 Left,Medial Lower Leg: Moisturizing lotion - mixed with TCA.to leg Wound Cleansing: Wound #1 Left,Medial Lower Leg: May shower with protection. Primary Wound Dressing: Wound #1 Left,Medial Lower Leg: Other: - sorbact Secondary Dressing: Wound #1 Left,Medial Lower Leg: Foam - pad up ankle area for protection. Dry Gauze Edema Control: 3 Layer Compression System - Left Lower Extremity - apply unna boot upper portion of lower leg. Avoid standing for long periods of time Elevate legs to the level of the heart or above for 30 minutes daily and/or when sitting, a frequency of: - throughout the day. elevated feet while sitting and working on computer as well. Exercise regularly Additional Orders / Instructions: Stop/Decrease Smoking Follow  Nutritious Diet - increase protein and vegetables within your cardiac diet. 1. I continued with Sorbact under 3 layer compression 2. Patient complaining of pain in the dorsal ankle we padded up this area 3. He complains of increasing discomfort when he is legs start to swell. 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 I have asked him to keep these propped up during the day as much as he can. 4. Nice improvement in the wound surface area Electronic Signature(s) Signed: 11/23/2018 6:13:48 PM By: Linton Ham MD Entered By: Linton Ham on 11/23/2018 16:02:32 -------------------------------------------------------------------------------- SuperBill Details Patient Name: Date of Service: MISHAEL, MASSENA 11/23/2018 Medical Record [id number] Patient Account Number: [id number] Date of Birth/Sex: Treating RN: November 24, 1960 (58 y.o. M) Rolin Barry,  [facility] Primary Care Provider: [facility], Romelle Starcher Other Clinician: Referring Provider: Treating Provider/Extender:Mushka Laconte, [facility], Romelle Starcher Weeks in Treatment: 10 Diagnosis Coding ICD-10 Codes Code Description [phone] Non-pressure chronic ulcer of other part of left lower leg with fat layer exposed S80.12XD Contusion of left lower leg, subsequent encounter I87.322 Chronic venous hypertension (idiopathic) with inflammation of left lower extremity Facility Procedures CPT4 Code Description: [id number] - DEB SUBQ TISSUE 20 SQ CM/< ICD-10 Diagnosis Description L97.822 Non-pressure chronic ulcer of other part of left lower leg Modifier: with fat laye Quantity: 1 r exposed Physician Procedures CPT4 Code Description: [id number] [id number] - WC PHYS SUBQ TISS 20 SQ CM ICD-10 Diagnosis Description L97.822 Non-pressure chronic ulcer of other part of left lower leg Modifier: with fat laye Quantity: 1 r exposed Electronic Signature(s) Signed: 11/23/2018 6:13:48 PM By: Linton Ham MD Entered By: Linton Ham on 11/23/2018 16:02:47

## 2018-11-24 ENCOUNTER — Encounter: Payer: Self-pay | Admitting: Cardiology

## 2018-11-24 ENCOUNTER — Other Ambulatory Visit: Payer: Self-pay

## 2018-11-24 ENCOUNTER — Telehealth (INDEPENDENT_AMBULATORY_CARE_PROVIDER_SITE_OTHER): Payer: Medicaid Other | Admitting: Cardiology

## 2018-11-24 VITALS — Ht 71.0 in | Wt 325.0 lb

## 2018-11-24 DIAGNOSIS — I5032 Chronic diastolic (congestive) heart failure: Secondary | ICD-10-CM | POA: Diagnosis not present

## 2018-11-24 DIAGNOSIS — I5030 Unspecified diastolic (congestive) heart failure: Secondary | ICD-10-CM | POA: Diagnosis not present

## 2018-11-24 DIAGNOSIS — G4733 Obstructive sleep apnea (adult) (pediatric): Secondary | ICD-10-CM

## 2018-11-24 DIAGNOSIS — I10 Essential (primary) hypertension: Secondary | ICD-10-CM

## 2018-11-24 MED ORDER — POTASSIUM CHLORIDE CRYS ER 20 MEQ PO TBCR: 20.0000 meq | EXTENDED_RELEASE_TABLET | Freq: Every day | ORAL | 1 refills | Status: DC
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
## 2018-11-24 NOTE — Patient Instructions (Signed)
Medication Instructions:   START TAKING POTASSIUM CHLORIDE 20 mEq BY MOUTH DAILY  If you need a refill on your cardiac medications before your next appointment, please call your pharmacy.    Lab work:  YOU WILL NEED BLOOD WORK TO CHECK A BMET IN OUR OFFICE IN ONE WEEK--YOUR LAB APPOINTMENT IS SCHEDULED FOR NEXT Friday 12/01/18 AT 1:30 PM  If you have labs (blood work) drawn today and your tests are completely normal, you will receive your results only by: Marland Kitchen MyChart Message (if you have MyChart) OR . A paper copy in the mail If you have any lab test that is abnormal or we need to change your treatment, we will call you to review the results.   Testing/Procedures:  Your physician has recommended that you have a home sleep study. This test records several body functions during sleep, including: brain activity, eye movement, oxygen and carbon dioxide blood levels, heart rate and rhythm, breathing rate and rhythm, the flow of air through your mouth and nose, snoring, body muscle movements, and chest and belly movement.  DR. Theodosia Blender SLEEP STUDY COORDINATOR NINA JONES WILL BE CALLING YOU SOON TO HAVE THIS SET-UP AND ARRANGED.    Follow-Up:  KEEP YOUR FOLLOW-UP APPOINTMENT AS SCHEDULED IN [facility] FOR 12/07/18 AT 11:30 AM   WITH DAYNA DUNN PA-C ON December 26, 2018 AT 1:00 PM IN THE OFFICE

## 2018-11-27 ENCOUNTER — Telehealth: Payer: Self-pay | Admitting: *Deleted

## 2018-11-27 ENCOUNTER — Ambulatory Visit: Payer: Medicaid Other | Admitting: Speech Pathology

## 2018-11-27 DIAGNOSIS — G4733 Obstructive sleep apnea (adult) (pediatric): Secondary | ICD-10-CM

## 2018-11-27 NOTE — Telephone Encounter (Signed)
-----   Message from Nuala Alpha, LPN sent at [phone]  2:09 PM EDT ----- Regarding: [place] STUDY PER DR TURNER Per Dr. Radford Pax, she asked that I send you this message on this established sleep pt to order for him to get a home sleep study done for OSA.  You will need to order this and call the pt about this.  He is aware you will be calling him.  Thanks, Karlene Einstein  ----- Message ----- From: [name], MD Sent: 11/24/2018   1:45 PM EDT To: Nuala Alpha, LPN  Home sleep study - for sleep apnea.  Followup pending results of study.  Please make sure he has folowup in advanced HF clinic

## 2018-11-28 NOTE — Telephone Encounter (Signed)
Patient is aware and agreeable to Home Sleep Study through [facility] Patient is scheduled for 01/15/19 at 2 pm to pick up home sleep kit and meet with Respiratory therapist at [facility]. Patient is aware that if this appointment date and time does not work for them they should contact Artis Delay directly at [phone]. Patient is aware that a sleep packet will be sent from [facility] in week. Patient is agreeable to treatment and thankful for call.

## 2018-11-29 ENCOUNTER — Ambulatory Visit: Payer: Medicaid Other | Admitting: Speech Pathology

## 2018-11-30 ENCOUNTER — Encounter (HOSPITAL_BASED_OUTPATIENT_CLINIC_OR_DEPARTMENT_OTHER): Payer: Medicaid Other | Admitting: Internal Medicine

## 2018-11-30 ENCOUNTER — Encounter: Payer: Self-pay | Admitting: *Deleted

## 2018-11-30 ENCOUNTER — Other Ambulatory Visit: Payer: Self-pay

## 2018-11-30 DIAGNOSIS — G629 Polyneuropathy, unspecified: Secondary | ICD-10-CM | POA: Diagnosis not present

## 2018-11-30 DIAGNOSIS — I2510 Atherosclerotic heart disease of native coronary artery without angina pectoris: Secondary | ICD-10-CM | POA: Diagnosis not present

## 2018-11-30 DIAGNOSIS — G4730 Sleep apnea, unspecified: Secondary | ICD-10-CM | POA: Diagnosis not present

## 2018-11-30 DIAGNOSIS — I87332 Chronic venous hypertension (idiopathic) with ulcer and inflammation of left lower extremity: Secondary | ICD-10-CM | POA: Diagnosis not present

## 2018-11-30 DIAGNOSIS — I509 Heart failure, unspecified: Secondary | ICD-10-CM | POA: Diagnosis not present

## 2018-11-30 DIAGNOSIS — I110 Hypertensive heart disease with heart failure: Secondary | ICD-10-CM | POA: Diagnosis not present

## 2018-11-30 DIAGNOSIS — I87312 Chronic venous hypertension (idiopathic) with ulcer of left lower extremity: Secondary | ICD-10-CM | POA: Diagnosis not present

## 2018-11-30 DIAGNOSIS — L97822 Non-pressure chronic ulcer of other part of left lower leg with fat layer exposed: Secondary | ICD-10-CM | POA: Diagnosis not present

## 2018-11-30 NOTE — Progress Notes (Signed)
[name], Collier ([id number]) Visit Report for 11/30/2018 Debridement Details Patient Name: Date of Service: Russell Collier, Russell Collier 11/30/2018 10:30 AM Medical Record [id number] Patient Account Number: [id number] Date of Birth/Sex: 12/15/1960 (58 y.o. M) Treating RN: Deon Pilling Primary Care Provider: [facility], Romelle Starcher Other Clinician: Referring Provider: Treating Provider/Extender:Robson, Peggyann Juba, Romelle Starcher Weeks in Treatment: 11 Debridement Performed for Wound #1 Left,Medial Lower Leg Assessment: Performed By: Physician Ricard Dillon., MD Debridement Type: Debridement Severity of Tissue Pre Fat layer exposed Debridement: Level of Consciousness (Pre- Awake and Alert procedure): Pre-procedure Verification/Time Out Taken: Yes - 11:40 Start Time: 11:41 Pain Control: Lidocaine 4% Topical Solution Total Area Debrided (L x W): 0.5 (cm) x 0.6 (cm) = 0.3 (cm) Tissue and other material Viable, Non-Viable, Slough, Subcutaneous, Fibrin/Exudate, Slough debrided: Level: Skin/Subcutaneous Tissue Debridement Description: Excisional Instrument: Curette Bleeding: Minimum Hemostasis Achieved: Pressure End Time: 11:44 Procedural Pain: 0 Post Procedural Pain: 0 Response to Treatment: Procedure was tolerated well Level of Consciousness Awake and Alert (Post-procedure): Post Debridement Measurements of Total Wound Length: (cm) 0.5 Width: (cm) 0.6 Depth: (cm) 0.2 Volume: (cm) 0.047 Character of Wound/Ulcer Post Improved Debridement: Severity of Tissue Post Debridement: Fat layer exposed Post Procedure Diagnosis Same as Pre-procedure Electronic Signature(s) Signed: 11/30/2018 5:49:31 PM By: Linton Ham MD Signed: 11/30/2018 6:06:48 PM By: Deon Pilling Entered By: Linton Ham on 11/30/2018 11:48:56 -------------------------------------------------------------------------------- HPI Details Patient Name: Date of Service: Russell, Collier 11/30/2018 10:30 AM Medical Record  [id number] Patient Account Number: [id number] Date of Birth/Sex: Treating RN: 06-11-1960 (58 y.o. [name] Primary Care Provider: Meryle Ready Other Clinician: Referring Provider: Treating Provider/Extender:Robson, [facility], Romelle Starcher Weeks in Treatment: 11 History of Present Illness HPI Description: ADMISSION 09/14/2018 This is a 58 year old man who traumatized his left anterior tibia area on the bathtub. He describes this initially as a bruise that subsequently reopened. He has been seen in the ER several times and has had at least 3 courses of antibiotics including doxy and Keflex twice. He has been using Neosporin and peroxide on this. Past medical history includes obstructive sleep apnea, cellulitis, hypertension, coronary artery disease, peripheral neuropathy [nondiabetic], currently on a heart murmur for atypical chest pain and bradycardia. 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 ARTERIAL STUDIES done in March 2020 showed an ABI on the left of 0.93, TBI on the left slightly reduced at 0.42. On the right his ABI was 0.95 with a TBI of 0.51 he has monophasic waveforms in the lower extremities bilaterally. Interpreted on the left this showing mild left lower extremity arterial disease as well as on the right. 09/25/18-Patient returns at 1 week to the clinic, noted that arterial studies done in March 2020. We have been using Iodoflex to the wound on the left leg, patient has significant degree of pain and declines to have any debridement 8/21; the patient has not been here in 3 weeks. Since he was last here he was admitted to hospital on 8/4. He was felt to have stable angina. He has since been to his cardiologist. I note that he has an EF of about 48%. He has a venous wound on the left anterior mid tibia area probably with lymphedema. We have been using Iodoflex. He does not tolerate debridement well. He tells me that his cardiologist has been adjusting his Lasix and  then the wraps fall down he tries to pull them up. He is describing pain making it difficult for him to function. 9/3; patient is was hospitalized since he was last here with COPD exacerbation. Came in with  Aquacel Ag on the wound. The wound actually looks improved. 9/17; patient was apparently in the ER again with breathing issues and blood pressure issues. He was not admitted. We are using Sorbact of the wound with nice improvement dimensions are better 10/1; the patient's wound is smaller and appears to have a reasonable healthy surface. We are using Sorbact 10/8; the wound is smaller although there is still an area with depth and necrotic debris at the base. We are using [facility]) Signed: 11/30/2018 5:49:31 PM By: Linton Ham MD Entered By: Linton Ham on 11/30/2018 11:49:28 -------------------------------------------------------------------------------- Physical Exam Details Patient Name: Date of Service: KENANIAH, Collier 11/30/2018 10:30 AM Medical Record [id number] Patient Account Number: [id number] Date of Birth/Sex: Treating RN: 1961/02/04 (58 y.o. [name] Primary Care Provider: Meryle Ready Other Clinician: Referring Provider: Treating Provider/Extender:Robson, [facility], Romelle Starcher Weeks in Treatment: 11 Constitutional Patient is hypertensive.. Pulse regular and within target range for patient.Marland Kitchen Respirations regular, non-labored and within target range.. Temperature is normal and within the target range for the patient.Marland Kitchen Appears in no distress. Notes Wound exam; left anterior shin the remaining area is smaller although the area with some depth still remains adherent debris over the surface debrided with a #3 curette hemostasis with direct pressure. No evidence of surrounding infection Electronic Signature(s) Signed: 11/30/2018 5:49:31 PM By: Linton Ham MD Entered By: Linton Ham on 11/30/2018  11:50:07 -------------------------------------------------------------------------------- Physician Orders Details Patient Name: Date of Service: SAIR, KARAU 11/30/2018 10:30 AM Medical Record [id number] Patient Account Number: [id number] Date of Birth/Sex: Treating RN: 1961-02-13 (58 y.o. [name] Primary Care Provider: [place], Romelle Starcher Other Clinician: Referring Provider: Treating Provider/Extender:Robson, Peggyann Juba, Jairo Ben in Treatment: 85 Verbal / Phone Orders: No Diagnosis Coding ICD-10 Coding Code Description [phone] Non-pressure chronic ulcer of other part of left lower leg with fat layer exposed S80.12XD Contusion of left lower leg, subsequent encounter I87.322 Chronic venous hypertension (idiopathic) with inflammation of left lower extremity Follow-up Appointments Return Appointment in 1 week. Dressing Change Frequency Wound #1 Left,Medial Lower Leg Do not change entire dressing for one week. Skin Barriers/Peri-Wound Care Wound #1 Left,Medial Lower Leg Moisturizing lotion - mixed with TCA.to leg Wound Cleansing Wound #1 Left,Medial Lower Leg May shower with protection. Primary Wound Dressing Wound #1 Left,Medial Lower Leg Other: - sorbact Secondary Dressing Wound #1 Left,Medial Lower Leg Foam - pad up ankle area for protection. Dry Gauze Edema Control 3 Layer Compression System - Left Lower Extremity - apply unna boot upper portion of lower leg. Avoid standing for long periods of time Elevate legs to the level of the heart or above for 30 minutes daily and/or when sitting, a frequency of: - throughout the day. elevated feet while sitting and working on computer as well. Exercise regularly Additional Orders / Instructions Stop/Decrease Smoking Follow Nutritious Diet - increase protein and vegetables within your cardiac diet. Electronic Signature(s) Signed: 11/30/2018 5:49:31 PM By: Linton Ham MD Signed: 11/30/2018 6:06:48 PM By:  Deon Pilling Entered By: Deon Pilling on 11/30/2018 11:45:39 -------------------------------------------------------------------------------- Problem List Details Patient Name: Date of Service: GARRELL, LAVERS 11/30/2018 10:30 AM Medical Record [id number] Patient Account Number: [id number] Date of Birth/Sex: Treating RN: 07-06-1960 (58 y.o. [name] Primary Care Provider: [place], Romelle Starcher Other Clinician: Referring Provider: Treating Provider/Extender:Robson, [facility], Jairo Ben in Treatment: 11 Active Problems ICD-10 Evaluated Encounter Code Description Active Date Today Diagnosis L97.822 Non-pressure chronic ulcer of other part of left lower 09/14/2018 No Yes leg with fat layer exposed S80.12XD Contusion of left  lower leg, subsequent encounter 09/14/2018 No Yes I87.322 Chronic venous hypertension (idiopathic) with 09/14/2018 No Yes inflammation of left lower extremity Inactive Problems Resolved Problems Electronic Signature(s) Signed: 11/30/2018 5:49:31 PM By: Linton Ham MD Entered By: Linton Ham on 11/30/2018 11:48:40 -------------------------------------------------------------------------------- Progress Note Details Patient Name: Date of Service: ZANI, KONOW 11/30/2018 10:30 AM Medical Record [id number] Patient Account Number: [id number] Date of Birth/Sex: Treating RN: 12/23/1960 (58 y.o. [name] Primary Care Provider: Meryle Ready Other Clinician: Referring Provider: Treating Provider/Extender:Robson, [facility], Romelle Starcher Weeks in Treatment: 11 Subjective History of Present Illness (HPI) ADMISSION 09/14/2018 This is a 58 year old man who traumatized his left anterior tibia area on the bathtub. He describes this initially as a bruise that subsequently reopened. He has been seen in the ER several times and has had at least 3 courses of antibiotics including doxy and Keflex twice. He has been using Neosporin and  peroxide on this. Past medical history includes obstructive sleep apnea, cellulitis, hypertension, coronary artery disease, peripheral neuropathy [nondiabetic], currently on a heart murmur for atypical chest pain and bradycardia. 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 ARTERIAL STUDIES done in March 2020 showed an ABI on the left of 0.93, TBI on the left slightly reduced at 0.42. On the right his ABI was 0.95 with a TBI of 0.51 he has monophasic waveforms in the lower extremities bilaterally. Interpreted on the left this showing mild left lower extremity arterial disease as well as on the right. 09/25/18-Patient returns at 1 week to the clinic, noted that arterial studies done in March 2020. We have been using Iodoflex to the wound on the left leg, patient has significant degree of pain and declines to have any debridement 8/21; the patient has not been here in 3 weeks. Since he was last here he was admitted to hospital on 8/4. He was felt to have stable angina. He has since been to his cardiologist. I note that he has an EF of about 48%. He has a venous wound on the left anterior mid tibia area probably with lymphedema. We have been using Iodoflex. He does not tolerate debridement well. He tells me that his cardiologist has been adjusting his Lasix and then the wraps fall down he tries to pull them up. He is describing pain making it difficult for him to function. 9/3; patient is was hospitalized since he was last here with COPD exacerbation. Came in with Aquacel Ag on the wound. The wound actually looks improved. 9/17; patient was apparently in the ER again with breathing issues and blood pressure issues. He was not admitted. We are using Sorbact of the wound with nice improvement dimensions are better 10/1; the patient's wound is smaller and appears to have a reasonable healthy surface. We are using Sorbact 10/8; the wound is smaller although there is still an area with depth and necrotic  debris at the base. We are using Sorbact Objective Constitutional Patient is hypertensive.. Pulse regular and within target range for patient.Marland Kitchen Respirations regular, non-labored and within target range.. Temperature is normal and within the target range for the patient.Marland Kitchen Appears in no distress. Vitals Time Taken: 11:00 AM, Height: 71 in, Weight: 320 lbs, BMI: 44.6, Temperature: 98.6 F, Pulse: 79 bpm, Respiratory Rate: 18 breaths/min, Blood Pressure: 151/76 mmHg. General Notes: Wound exam; left anterior shin the remaining area is smaller although the area with some depth still remains adherent debris over the surface debrided with a #3 curette hemostasis with direct pressure. No evidence of surrounding infection Integumentary (Hair, Skin)  Wound #1 status is Open. Original cause of wound was Trauma. The wound is located on the Left,Medial Lower Leg. The wound measures 0.5cm length x 0.6cm width x 0.2cm depth; 0.236cm^2 area and 0.047cm^3 volume. There is Fat Layer (Subcutaneous Tissue) Exposed exposed. There is no tunneling or undermining noted. There is a small amount of sanguinous drainage noted. The wound margin is distinct with the outline attached to the wound base. There is medium (34-66%) red, pink granulation within the wound bed. There is a medium (34-66%) amount of necrotic tissue within the wound bed including Adherent Slough. Assessment Active Problems ICD-10 Non-pressure chronic ulcer of other part of left lower leg with fat layer exposed Contusion of left lower leg, subsequent encounter Chronic venous hypertension (idiopathic) with inflammation of left lower extremity Procedures Wound #1 Pre-procedure diagnosis of Wound #1 is a Venous Leg Ulcer located on the Left,Medial Lower Leg .Severity of Tissue Pre Debridement is: Fat layer exposed. There was a Excisional Skin/Subcutaneous Tissue Debridement with a total area of 0.3 sq cm performed by Ricard Dillon., MD. With the  following instrument(s): Curette to remove Viable and Non-Viable tissue/material. Material removed includes Subcutaneous Tissue, Slough, and Fibrin/Exudate after achieving pain control using Lidocaine 4% Topical Solution. A time out was conducted at 11:40, prior to the start of the procedure. A Minimum amount of bleeding was controlled with Pressure. The procedure was tolerated well with a pain level of 0 throughout and a pain level of 0 following the procedure. Post Debridement Measurements: 0.5cm length x 0.6cm width x 0.2cm depth; 0.047cm^3 volume. Character of Wound/Ulcer Post Debridement is improved. Severity of Tissue Post Debridement is: Fat layer exposed. Post procedure Diagnosis Wound #1: Same as Pre-Procedure Pre-procedure diagnosis of Wound #1 is a Venous Leg Ulcer located on the Left,Medial Lower Leg . There was a Three Layer Compression Therapy Procedure with a pre-treatment ABI of 0.9 by Deon Pilling, RN. Post procedure Diagnosis Wound #1: Same as Pre-Procedure Plan Follow-up Appointments: Return Appointment in 1 week. Dressing Change Frequency: Wound #1 Left,Medial Lower Leg: Do not change entire dressing for one week. Skin Barriers/Peri-Wound Care: Wound #1 Left,Medial Lower Leg: Moisturizing lotion - mixed with TCA.to leg Wound Cleansing: Wound #1 Left,Medial Lower Leg: May shower with protection. Primary Wound Dressing: Wound #1 Left,Medial Lower Leg: Other: - sorbact Secondary Dressing: Wound #1 Left,Medial Lower Leg: Foam - pad up ankle area for protection. Dry Gauze Edema Control: 3 Layer Compression System - Left Lower Extremity - apply unna boot upper portion of lower leg. Avoid standing for long periods of time Elevate legs to the level of the heart or above for 30 minutes daily and/or when sitting, a frequency of: - throughout the day. elevated feet while sitting and working on computer as well. Exercise regularly Additional Orders /  Instructions: Stop/Decrease Smoking Follow Nutritious Diet - increase protein and vegetables within your cardiac diet. 1. Sorbact 2. Progressing towards closure. Electronic Signature(s) Signed: 11/30/2018 5:49:31 PM By: Linton Ham MD Entered By: Linton Ham on 11/30/2018 11:50:46 -------------------------------------------------------------------------------- SuperBill Details Patient Name: Date of Service: TREVYN, CALDERON 11/30/2018 Medical Record [id number] Patient Account Number: [id number] Date of Birth/Sex: Treating RN: [birth year] (58 y.o. [name] Primary Care Provider: Meryle Ready Other Clinician: Referring Provider: Treating Provider/Extender:Robson, [facility], Romelle Starcher Weeks in Treatment: 11 Diagnosis Coding ICD-10 Codes Code Description [phone] Non-pressure chronic ulcer of other part of left lower leg with fat layer exposed S80.12XD Contusion of left lower leg, subsequent encounter I87.322 Chronic venous hypertension (idiopathic)  with inflammation of left lower extremity Facility Procedures CPT4 Code Description: [id number] [place] - DEB SUBQ TISSUE 20 SQ CM/< ICD-10 Diagnosis Description L97.822 Non-pressure chronic ulcer of other part of left lower leg Modifier: with fat laye Quantity: 1 r exposed Physician Procedures CPT4 Code Description: [id number] - WC PHYS SUBQ TISS 20 SQ CM ICD-10 Diagnosis Description L97.822 Non-pressure chronic ulcer of other part of left lower leg Modifier: with fat laye Quantity: 1 r exposed Electronic Signature(s) Signed: 11/30/2018 5:49:31 PM By: Linton Ham MD Entered By: Linton Ham on 11/30/2018 11:50:57

## 2018-11-30 NOTE — Telephone Encounter (Signed)
This encounter was created in error - please disregard.

## 2018-11-30 NOTE — Telephone Encounter (Signed)
-----   Message from Nuala Alpha, LPN sent at [phone]  2:09 PM EDT ----- Regarding: [employer] STUDY PER DR TURNER Per Dr. Radford Pax, she asked that I send you this message on this established sleep pt to order for him to get a home sleep study done for OSA.  You will need to order this and call the pt about this.  He is aware you will be calling him.  Thanks, Karlene Einstein  ----- Message ----- From: [name], MD Sent: 11/24/2018   1:45 PM EDT To: Nuala Alpha, LPN  Home sleep study - for sleep apnea.  Followup pending results of study.  Please make sure he has folowup in advanced HF clinic

## 2018-12-01 ENCOUNTER — Other Ambulatory Visit: Payer: Medicaid Other | Admitting: *Deleted

## 2018-12-01 DIAGNOSIS — I10 Essential (primary) hypertension: Secondary | ICD-10-CM | POA: Diagnosis not present

## 2018-12-01 DIAGNOSIS — I5030 Unspecified diastolic (congestive) heart failure: Secondary | ICD-10-CM | POA: Diagnosis not present

## 2018-12-01 DIAGNOSIS — G4733 Obstructive sleep apnea (adult) (pediatric): Secondary | ICD-10-CM

## 2018-12-01 DIAGNOSIS — I5032 Chronic diastolic (congestive) heart failure: Secondary | ICD-10-CM | POA: Diagnosis not present

## 2018-12-01 NOTE — Progress Notes (Signed)
GADIEL, JOHN ([id number]) Visit Report for 11/23/2018 Arrival Information Details Patient Name: Date of Service: JUMAANE, WEATHERFORD 11/23/2018 2:45 PM Medical Record [id number] Patient Account Number: [id number] Date of Birth/Sex: Treating RN: 1960-10-22 (58 y.o. [name] Primary Care Nayomi Tabron: [place], Romelle Starcher Other Clinician: Referring Namiah Dunnavant: Treating Mindy Behnken/Extender:Robson, Peggyann Juba, Romelle Starcher Weeks in Treatment: 10 Visit Information History Since Last Visit Added or deleted any medications: No Patient Arrived: Ambulatory Any new allergies or adverse reactions: No Arrival Time: 15:14 Had a fall or experienced change in No Accompanied By: self activities of daily living that may affect Transfer Assistance: None risk of falls: Patient Identification Verified: Yes Signs or symptoms of abuse/neglect since last No Secondary Verification Process Completed: Yes visito Patient Requires Transmission-Based No Hospitalized since last visit: No Precautions: Implantable device outside of the clinic excluding No Patient Has Alerts: Yes cellular tissue based products placed in the center Patient Alerts: L ABI = since last visit: 0.93 Has Dressing in Place as Prescribed: Yes R ABI = Has Compression in Place as Prescribed: Yes 0.95 Pain Present Now: Yes Electronic Signature(s) Signed: 12/01/2018 5:13:27 PM By: Kela Millin Entered By: Kela Millin on 11/23/2018 15:19:35 -------------------------------------------------------------------------------- Compression Therapy Details Patient Name: Date of Service: RIGLEY, NIESS 11/23/2018 2:45 PM Medical Record [id number] Patient Account Number: [id number] Date of Birth/Sex: Treating RN: 21-Jan-1961 (58 y.o. [name] Primary Care Larz Mark: Meryle Ready Other Clinician: Referring Berneta Sconyers: Treating Rory Xiang/Extender:Robson, [facility], Romelle Starcher Weeks in Treatment: 10 Compression  Therapy Performed for Wound Wound #1 Left,Medial Lower Leg Assessment: Performed By: Clinician Baruch Gouty, RN Compression Type: Three Layer Pre Treatment ABI: 0.9 Post Procedure Diagnosis Same as Pre-procedure Electronic Signature(s) Signed: 11/23/2018 6:31:45 PM By: Deon Pilling Entered By: Deon Pilling on 11/23/2018 18:08:39 -------------------------------------------------------------------------------- Encounter Discharge Information Details Patient Name: Date of Service: LIBERATO, STANSBERY 11/23/2018 2:45 PM Medical Record [id number] Patient Account Number: [id number] Date of Birth/Sex: Treating RN: 11-11-1960 (58 y.o. Ernestene Mention Primary Care Avraj Lindroth: [place], Romelle Starcher Other Clinician: Referring Bueford Arp: Treating Yatziri Wainwright/Extender:Robson, Peggyann Juba, Romelle Starcher Weeks in Treatment: 10 Encounter Discharge Information Items Post Procedure Vitals Discharge Condition: Stable Temperature (F): 98.1 Ambulatory Status: Ambulatory Pulse (bpm): 80 Discharge Destination: Home Respiratory Rate (breaths/min): 20 Transportation: Private Auto Blood Pressure (mmHg): 129/70 Accompanied By: self Schedule Follow-up Appointment: Yes Clinical Summary of Care: Patient Declined Electronic Signature(s) Signed: 11/24/2018 7:05:19 PM By: Baruch Gouty RN, BSN Entered By: Baruch Gouty on 11/23/2018 16:22:07 -------------------------------------------------------------------------------- Lower Extremity Assessment Details Patient Name: Date of Service: ANTAWN, SISON 11/23/2018 2:45 PM Medical Record [id number] Patient Account Number: [id number] Date of Birth/Sex: Treating RN: [birth year] (58 y.o. [name] Primary Care Remus Hagedorn: [place], Romelle Starcher Other Clinician: Referring Erza Mothershead: Treating Athanasios Heldman/Extender:Robson, Peggyann Juba, Romelle Starcher Weeks in Treatment: 10 Edema Assessment Assessed: [Left: No] [Right: No] Edema: [Left: Ye] [Right: s] Calf Left:  Right: Point of Measurement: cm From Medial Instep 44.5 cm cm Ankle Left: Right: Point of Measurement: cm From Medial Instep 22.5 cm cm Vascular Assessment Pulses: Dorsalis Pedis Palpable: [Left:Yes] Electronic Signature(s) Signed: 12/01/2018 5:13:27 PM By: Kela Millin Entered By: Kela Millin on 11/23/2018 15:26:33 -------------------------------------------------------------------------------- Multi Wound Chart Details Patient Name: Date of Service: SALIF, TAY 11/23/2018 2:45 PM Medical Record [id number] Patient Account Number: [id number] Date of Birth/Sex: Treating RN: [birth year] (58 y.o. [name] Primary Care Warrene Kapfer: [place], Romelle Starcher Other Clinician: Referring Dell Briner: Treating Teniola Tseng/Extender:Robson, Peggyann Juba, Romelle Starcher Weeks in Treatment: 10 Vital Signs Height(in): 71 Pulse(bpm): 80 Weight(lbs): 320 Blood Pressure(mmHg): 129/70 Body Mass Index(BMI): 45 Temperature(F): 98.1 Respiratory 16 Rate(breaths/min): Photos: [1:No Photos] [  N/A:N/A] Wound Location: [1:Left Lower Leg - Medial] [N/A:N/A] Wounding Event: [1:Trauma] [N/A:N/A] Primary Etiology: [1:Venous Leg Ulcer] [N/A:N/A] Comorbid History: [1:Hemophilia, Asthma, Sleep Apnea, Angina, Congestive Heart Failure, Coronary Artery Disease, Hypertension, Osteoarthritis, Neuropathy] [N/A:N/A] Date Acquired: [1:07/13/2018] [N/A:N/A] Weeks of Treatment: [1:10] [N/A:N/A] Wound Status: [1:Open] [N/A:N/A] Measurements L x W x D 1.8x0.5x0.1 [N/A:N/A] (cm) Area (cm) : [1:0.707] [N/A:N/A] Volume (cm) : [1:0.071] [N/A:N/A] % Reduction in Area: [1:86.80%] [N/A:N/A] % Reduction in Volume: [1:86.80%] [N/A:N/A] Classification: [1:Full Thickness Without Exposed Support Structures] [N/A:N/A] Exudate Amount: [1:Small] [N/A:N/A] Exudate Type: [1:Sanguinous] [N/A:N/A] Exudate Color: [1:red] [N/A:N/A] Wound Margin: [1:Distinct, outline attached N/A] Granulation Amount: [1:Large (67-100%)]  [N/A:N/A] Granulation Quality: [1:Red, Pink] [N/A:N/A] Necrotic Amount: [1:Small (1-33%)] [N/A:N/A] Exposed Structures: [1:Fat Layer (Subcutaneous N/A Tissue) Exposed: Yes Fascia: No Tendon: No Muscle: No Joint: No Bone: No] Epithelialization: [1:Small (1-33%)] [N/A:N/A] Debridement: [1:Debridement - Excisional N/A] Pre-procedure [1:15:50] [N/A:N/A] Verification/Time Out Taken: Pain Control: [1:Lidocaine 4% Topical Solution] [N/A:N/A] Tissue Debrided: [1:Subcutaneous, Slough] [N/A:N/A] Level: [1:Skin/Subcutaneous Tissue] [N/A:N/A] Debridement Area (sq cm):0.9 [N/A:N/A] Instrument: [1:Curette] [N/A:N/A] Bleeding: [1:Minimum] [N/A:N/A] Hemostasis Achieved: [1:Pressure] [N/A:N/A] Procedural Pain: [1:1] [N/A:N/A] Post Procedural Pain: [1:3] [N/A:N/A] Debridement Treatment Procedure was tolerated [N/A:N/A] Response: [1:well] Post Debridement [1:1.8x0.5x0.1] [N/A:N/A] Measurements L x W x D (cm) Post Debridement [1:0.071] [N/A:N/A] Volume: (cm) Procedures Performed: Debridement [N/A:N/A] Treatment Notes Electronic Signature(s) Signed: 11/23/2018 6:13:48 PM By: Linton Ham MD Signed: 11/23/2018 6:31:45 PM By: Deon Pilling Entered By: Linton Ham on 11/23/2018 15:57:56 -------------------------------------------------------------------------------- Multi-Disciplinary Care Plan Details Patient Name: Date of Service: BURNS, TIMSON 11/23/2018 2:45 PM Medical Record [id number] Patient Account Number: [id number] Date of Birth/Sex: Treating RN: [birth year] (58 y.o. [name] Primary Care Sharlett Lienemann: [place], Romelle Starcher Other Clinician: Referring Darianna Amy: Treating Cybil Senegal/Extender:Robson, Peggyann Juba, Romelle Starcher Weeks in Treatment: 10 Active Inactive Pain, Acute or Chronic Nursing Diagnoses: Pain, acute or chronic: actual or potential Potential alteration in comfort, pain Goals: Patient will verbalize adequate pain control and receive pain control interventions  during procedures as needed Date Initiated: 09/14/2018 Target Resolution Date: 12/22/2018 Goal Status: Active Patient/caregiver will verbalize comfort level met Date Initiated: 09/14/2018 Date Inactivated: 11/09/2018 Target Resolution Date: 11/24/2018 Goal Status: Met Interventions: Provide education on pain management Reposition patient for comfort Treatment Activities: Administer pain control measures as ordered : 09/14/2018 Notes: Wound/Skin Impairment Nursing Diagnoses: Knowledge deficit related to ulceration/compromised skin integrity Goals: Patient/caregiver will verbalize understanding of skin care regimen Date Initiated: 09/14/2018 Date Inactivated: 10/26/2018 Target Resolution Date: 11/03/2018 Goal Status: Met Ulcer/skin breakdown will heal within 14 weeks Date Initiated: 09/14/2018 Target Resolution Date: 01/19/2019 Goal Status: Active Interventions: Assess patient/caregiver ability to obtain necessary supplies Assess patient/caregiver ability to perform ulcer/skin care regimen upon admission and as needed Provide education on smoking Provide education on ulcer and skin care Treatment Activities: Skin care regimen initiated : 09/14/2018 Topical wound management initiated : 09/14/2018 Notes: Electronic Signature(s) Signed: 11/23/2018 6:31:45 PM By: Deon Pilling Entered By: Deon Pilling on 11/23/2018 15:37:30 -------------------------------------------------------------------------------- Pain Assessment Details Patient Name: Date of Service: DERMOT, GREMILLION 11/23/2018 2:45 PM Medical Record [id number] Patient Account Number: [id number] Date of Birth/Sex: Treating RN: [birth year] (58 y.o. [name] Primary Care Elizabth Palka: [place], Romelle Starcher Other Clinician: Referring [name]: Treating Heyli Min/Extender:Robson, Peggyann Juba, Romelle Starcher Weeks in Treatment: 10 Active Problems Location of Pain Severity and Description of Pain Patient Has Paino Yes Site  Locations Pain Location: Pain in Ulcers With Dressing Change: No Duration of the Pain. Constant / Intermittento Constant Rate the pain. Current Pain Level: 6 Worst Pain Level: 7 Least Pain Level:  3 Tolerable Pain Level: 6 Character of Pain Describe the Pain: Aching, Burning Pain Management and Medication Current Pain Management: Electronic Signature(s) Signed: 12/01/2018 5:13:27 PM By: Kela Millin Entered By: Kela Millin on 11/23/2018 15:25:54 -------------------------------------------------------------------------------- Patient/Caregiver Education Details Patient Name: Date of Service: WONG, STEADHAM 10/1/2020andnbsp2:45 PM Medical Record [id number] Patient Account Number: [id number] Date of Birth/Gender: 1960-03-29 (58 y.o. M) Treating RN: Deon Pilling Primary Care Physician: Meryle Ready Other Clinician: Referring Physician: Treating Physician/Extender:Robson, Peggyann Juba, Jairo Ben in Treatment: 10 Education Assessment Education Provided To: Patient Education Topics Provided Pain: Handouts: A Guide to Pain Control Methods: Explain/Verbal Responses: Reinforcements needed Electronic Signature(s) Signed: 11/23/2018 6:31:45 PM By: Deon Pilling Entered By: Deon Pilling on 11/23/2018 15:37:54 -------------------------------------------------------------------------------- Wound Assessment Details Patient Name: Date of Service: DEQUON, SCHNEBLY 11/23/2018 2:45 PM Medical Record [id number] Patient Account Number: [id number] Date of Birth/Sex: Treating RN: January 14, 1961 (58 y.o. [name] Primary Care Tiffeny Minchew: [place], Romelle Starcher Other Clinician: Referring Racquel Arkin: Treating Aladdin Kollmann/Extender:Robson, Peggyann Juba, Romelle Starcher Weeks in Treatment: 10 Wound Status Wound Number: 1 Primary Venous Leg Ulcer Etiology: Wound Location: Left Lower Leg - Medial Wound Open Wounding Event: Trauma Status: Date Acquired: 07/13/2018 Comorbid  Hemophilia, Asthma, Sleep Apnea, Angina, Weeks Of Treatment: 10 History: Congestive Heart Failure, Coronary Artery Clustered Wound: No Disease, Hypertension, Osteoarthritis, Neuropathy Photos Wound Measurements Length: (cm) 1.8 % Reduct Width: (cm) 0.5 % Reduct Depth: (cm) 0.1 Epitheli Area: (cm) 0.707 Tunneli Volume: (cm) 0.071 Undermi Wound Description Full Thickness Without Exposed Support Foul Odo Classification: Structures Slough/F Wound Distinct, outline attached Margin: Exudate Small Amount: Exudate Sanguinous Type: Exudate red Color: Wound Bed Granulation Amount: Large (67-100%) Granulation Quality: Red, Pink Fascia E Necrotic Amount: Small (1-33%) Fat Laye Necrotic Quality: Adherent Slough Tendon E Muscle E Joint Ex Bone Exp r After Cleansing: No ibrino Yes Exposed Structure xposed: No r (Subcutaneous Tissue) Exposed: Yes xposed: No xposed: No posed: No osed: No ion in Area: 86.8% ion in Volume: 86.8% alization: Small (1-33%) ng: No ning: No Electronic Signature(s) Signed: 11/24/2018 10:20:10 AM By: Mikeal Hawthorne EMT/HBOT Signed: 11/24/2018 6:13:58 PM By: Deon Pilling Previous Signature: 11/23/2018 6:31:45 PM Version By: Deon Pilling Entered By: Mikeal Hawthorne on 11/24/2018 08:39:47 -------------------------------------------------------------------------------- Vitals Details Patient Name: Date of Service: ADARSH, MUNDORF 11/23/2018 2:45 PM Medical Record [id number] Patient Account Number: [id number] Date of Birth/Sex: Treating RN: 1961/01/18 (58 y.o. [name] Primary Care Poet Hineman: [place], Romelle Starcher Other Clinician: Referring Latesia Norrington: Treating Etienne Millward/Extender:Robson, Peggyann Juba, Romelle Starcher Weeks in Treatment: 10 Vital Signs Time Taken: 15:19 Temperature (F): 98.1 Height (in): 71 Pulse (bpm): 80 Weight (lbs): 320 Respiratory Rate (breaths/min): 16 Body Mass Index (BMI): 44.6 Blood Pressure (mmHg):  129/70 Reference Range: 80 - 120 mg / dl Electronic Signature(s) Signed: 12/01/2018 5:13:27 PM By: Kela Millin Entered By: Kela Millin on 11/23/2018 15:21:22

## 2018-12-02 LAB — BASIC METABOLIC PANEL
BUN/Creatinine Ratio: 11 (ref 10–24)
BUN: 11 mg/dL (ref 8–27)
CO2: 25 mmol/L (ref 20–29)
Calcium: 9.4 mg/dL (ref 8.6–10.2)
Chloride: 103 mmol/L (ref 96–106)
Creatinine, Ser: 1 mg/dL (ref 0.76–1.27)
GFR calc Af Amer: 94 mL/min/{1.73_m2} (ref >59)
GFR calc non Af Amer: 81 mL/min/{1.73_m2} (ref >59)
Glucose: 117 mg/dL — ABNORMAL HIGH (ref 65–99)
Potassium: 4.3 mmol/L (ref 3.5–5.2)
Sodium: 142 mmol/L (ref 134–144)

## 2018-12-04 ENCOUNTER — Telehealth: Payer: Medicaid Other | Admitting: Cardiology

## 2018-12-04 ENCOUNTER — Encounter: Payer: Medicaid Other | Admitting: Speech Pathology

## 2018-12-06 ENCOUNTER — Encounter: Payer: Medicaid Other | Admitting: Speech Pathology

## 2018-12-06 ENCOUNTER — Other Ambulatory Visit: Payer: Self-pay

## 2018-12-06 ENCOUNTER — Encounter: Payer: Self-pay | Admitting: Speech Pathology

## 2018-12-06 ENCOUNTER — Ambulatory Visit: Payer: Medicaid Other | Attending: Otolaryngology | Admitting: Speech Pathology

## 2018-12-06 DIAGNOSIS — R498 Other voice and resonance disorders: Secondary | ICD-10-CM | POA: Insufficient documentation
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
## 2018-12-06 NOTE — Patient Instructions (Signed)
  Double check if the heart exercise classes have resumed   Good job reducing sodium - all fast food is really high in sodium  Keep practicing sniff and blow - add relaxation to your head, neck and shoulders -   App: breathe2relax - it would be good to do this with sniff and blow

## 2018-12-07 ENCOUNTER — Ambulatory Visit (HOSPITAL_COMMUNITY)
Admission: RE | Admit: 2018-12-07 | Discharge: 2018-12-07 | Disposition: A | Discharge status: Home / Self Care | Payer: Medicaid Other | Source: Ambulatory Visit | Attending: Adult Health | Admitting: Adult Health

## 2018-12-07 ENCOUNTER — Encounter (HOSPITAL_BASED_OUTPATIENT_CLINIC_OR_DEPARTMENT_OTHER): Payer: Medicaid Other | Admitting: Internal Medicine

## 2018-12-07 ENCOUNTER — Encounter (HOSPITAL_COMMUNITY): Payer: Self-pay

## 2018-12-07 VITALS — BP 128/64 | HR 78 | Wt 319.4 lb

## 2018-12-07 DIAGNOSIS — L97822 Non-pressure chronic ulcer of other part of left lower leg with fat layer exposed: Secondary | ICD-10-CM | POA: Diagnosis not present

## 2018-12-07 DIAGNOSIS — Z8249 Family history of ischemic heart disease and other diseases of the circulatory system: Secondary | ICD-10-CM | POA: Insufficient documentation

## 2018-12-07 DIAGNOSIS — I2510 Atherosclerotic heart disease of native coronary artery without angina pectoris: Secondary | ICD-10-CM | POA: Insufficient documentation

## 2018-12-07 DIAGNOSIS — E785 Hyperlipidemia, unspecified: Secondary | ICD-10-CM | POA: Diagnosis not present

## 2018-12-07 DIAGNOSIS — I87332 Chronic venous hypertension (idiopathic) with ulcer and inflammation of left lower extremity: Secondary | ICD-10-CM | POA: Diagnosis not present

## 2018-12-07 DIAGNOSIS — Z7982 Long term (current) use of aspirin: Secondary | ICD-10-CM | POA: Diagnosis not present

## 2018-12-07 DIAGNOSIS — Z823 Family history of stroke: Secondary | ICD-10-CM | POA: Diagnosis not present

## 2018-12-07 DIAGNOSIS — Z833 Family history of diabetes mellitus: Secondary | ICD-10-CM | POA: Insufficient documentation

## 2018-12-07 DIAGNOSIS — E7800 Pure hypercholesterolemia, unspecified: Secondary | ICD-10-CM | POA: Insufficient documentation

## 2018-12-07 DIAGNOSIS — E854 Organ-limited amyloidosis: Secondary | ICD-10-CM | POA: Diagnosis not present

## 2018-12-07 DIAGNOSIS — Z86718 Personal history of other venous thrombosis and embolism: Secondary | ICD-10-CM | POA: Diagnosis not present

## 2018-12-07 DIAGNOSIS — Z86711 Personal history of pulmonary embolism: Secondary | ICD-10-CM | POA: Diagnosis not present

## 2018-12-07 DIAGNOSIS — F17210 Nicotine dependence, cigarettes, uncomplicated: Secondary | ICD-10-CM | POA: Diagnosis not present

## 2018-12-07 DIAGNOSIS — I43 Cardiomyopathy in diseases classified elsewhere: Secondary | ICD-10-CM

## 2018-12-07 DIAGNOSIS — Z6841 Body Mass Index (BMI) 40.0 and over, adult: Secondary | ICD-10-CM | POA: Diagnosis not present

## 2018-12-07 DIAGNOSIS — M1990 Unspecified osteoarthritis, unspecified site: Secondary | ICD-10-CM | POA: Insufficient documentation

## 2018-12-07 DIAGNOSIS — Z79899 Other long term (current) drug therapy: Secondary | ICD-10-CM | POA: Insufficient documentation

## 2018-12-07 DIAGNOSIS — Z9119 Patient's noncompliance with other medical treatment and regimen: Secondary | ICD-10-CM | POA: Insufficient documentation

## 2018-12-07 DIAGNOSIS — G4733 Obstructive sleep apnea (adult) (pediatric): Secondary | ICD-10-CM | POA: Diagnosis not present

## 2018-12-07 DIAGNOSIS — G629 Polyneuropathy, unspecified: Secondary | ICD-10-CM | POA: Diagnosis not present

## 2018-12-07 DIAGNOSIS — J449 Chronic obstructive pulmonary disease, unspecified: Secondary | ICD-10-CM | POA: Diagnosis not present

## 2018-12-07 DIAGNOSIS — I87312 Chronic venous hypertension (idiopathic) with ulcer of left lower extremity: Secondary | ICD-10-CM | POA: Diagnosis not present

## 2018-12-07 DIAGNOSIS — I110 Hypertensive heart disease with heart failure: Secondary | ICD-10-CM | POA: Diagnosis not present

## 2018-12-07 DIAGNOSIS — G4730 Sleep apnea, unspecified: Secondary | ICD-10-CM | POA: Diagnosis not present

## 2018-12-07 DIAGNOSIS — I5032 Chronic diastolic (congestive) heart failure: Secondary | ICD-10-CM | POA: Diagnosis not present

## 2018-12-07 DIAGNOSIS — I509 Heart failure, unspecified: Secondary | ICD-10-CM | POA: Diagnosis not present

## 2018-12-07 DIAGNOSIS — E852 Heredofamilial amyloidosis, unspecified: Secondary | ICD-10-CM | POA: Diagnosis present

## 2018-12-07 LAB — BASIC METABOLIC PANEL
Anion gap: 10 (ref 5–15)
BUN: 7 mg/dL (ref 6–20)
CO2: 25 mmol/L (ref 22–32)
Calcium: 9 mg/dL (ref 8.9–10.3)
Chloride: 107 mmol/L (ref 98–111)
Creatinine, Ser: 0.9 mg/dL (ref 0.61–1.24)
GFR calc Af Amer: >60 mL/min (ref >60)
GFR calc non Af Amer: >60 mL/min (ref >60)
Glucose, Bld: 107 mg/dL — ABNORMAL HIGH (ref 70–99)
Potassium: 4.2 mmol/L (ref 3.5–5.1)
Sodium: 142 mmol/L (ref 135–145)

## 2018-12-07 LAB — CBC
HCT: 42.4 % (ref 39.0–52.0)
Hemoglobin: 14 g/dL (ref 13.0–17.0)
MCH: 29.7 pg (ref 26.0–34.0)
MCHC: 33 g/dL (ref 30.0–36.0)
MCV: 89.8 fL (ref 80.0–100.0)
Platelets: 264 10*3/uL (ref 150–400)
RBC: 4.72 MIL/uL (ref 4.22–5.81)
RDW: 15.7 % — ABNORMAL HIGH (ref 11.5–15.5)
WBC: 7.8 10*3/uL (ref 4.0–10.5)
nRBC: 0 % (ref 0.0–0.2)

## 2018-12-07 LAB — PROTIME-INR
INR: 1 (ref 0.8–1.2)
Prothrombin Time: 13.4 seconds (ref 11.4–15.2)

## 2018-12-07 MED ORDER — SODIUM CHLORIDE 0.9% FLUSH: 3.0000 mL | Freq: Two times a day (BID) | INTRAVENOUS | Status: DC

## 2018-12-07 MED ORDER — FUROSEMIDE 20 MG PO TABS: ORAL_TABLET | ORAL | 3 refills | Status: DC
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
## 2018-12-07 NOTE — Patient Instructions (Addendum)
Increase Furosemide to 40 mg in AM and 20 mg in PM  Please bring in last 3 months of bank statements or  Supplemental Security Income (SSI) program documentation so we can submit application for Patient Assistance for new medication Tafamidis (Vyndamax). This medication is expensive (~20,000/mo cash price). But we will work on getting Patient Assistance so it is affordable.   Right Heart Catheterization is scheduled for Fri 10/30, please see instruction sheet  Labs done today, we will contact you for abnormal readings  Your physician recommends that you schedule a follow-up appointment in: 4-6 weeks with Dr Haroldine Laws  At the [facility], you and your health needs are our priority. As part of our continuing mission to provide you with exceptional heart care, we have created designated Provider Care Teams. These Care Teams include your primary Cardiologist (physician) and Advanced Practice Providers (APPs- Physician Assistants and Nurse Practitioners) who all work together to provide you with the care you need, when you need it.   You may see any of the following providers on your designated Care Team at your next follow up: Marland Kitchen Dr Glori Bickers . Dr Loralie Champagne . Darrick Grinder, NP . Lyda Jester, PA   Please be sure to bring in all your medications bottles to every appointment.

## 2018-12-08 ENCOUNTER — Telehealth (HOSPITAL_COMMUNITY): Payer: Self-pay | Admitting: Pharmacist

## 2018-12-08 MED ORDER — VYNDAMAX 61 MG PO CAPS: 1.0000 | ORAL_CAPSULE | Freq: Every day | ORAL | 11 refills | Status: AC

## 2018-12-08 MED FILL — VYNDAMAX 61 MG CAPS: 61 | 30 days supply | Qty: 30 | Fill #0

## 2018-12-08 NOTE — Progress Notes (Addendum)
BRIXON, ZHEN ([id number]) Visit Report for 12/07/2018 Arrival Information Details Patient Name: Date of Service: Russell Collier, Russell Collier 12/07/2018 2:00 PM Medical Record [id number] Patient Account Number: [id number] Date of Birth/Sex: Treating RN: 31-Jul-1960 (58 y.o. [name] Primary Care Chavis Tessler: [place], Romelle Starcher Other Clinician: Referring Moet Mikulski: Treating Medard Decuir/Extender:Robson, Peggyann Juba, Jairo Ben in Treatment: 12 Visit Information History Since Last Visit Added or deleted any medications: No Patient Arrived: Ambulatory Any new allergies or adverse reactions: No Arrival Time: 15:08 Had a fall or experienced change in No Accompanied By: self activities of daily living that may affect Transfer Assistance: None risk of falls: Patient Identification Verified: Yes Signs or symptoms of abuse/neglect since last No Secondary Verification Process Completed: Yes visito Patient Requires Transmission-Based No Hospitalized since last visit: No Precautions: Implantable device outside of the clinic excluding No Patient Has Alerts: Yes cellular tissue based products placed in the center Patient Alerts: L ABI = since last visit: 0.93 Has Dressing in Place as Prescribed: Yes R ABI = Has Compression in Place as Prescribed: Yes 0.95 Pain Present Now: No Electronic Signature(s) Signed: 12/07/2018 6:42:53 PM By: Kela Millin Entered By: Kela Millin on 12/07/2018 15:08:45 -------------------------------------------------------------------------------- Compression Therapy Details Patient Name: Date of Service: Russell Collier, Russell Collier 12/07/2018 2:00 PM Medical Record [id number] Patient Account Number: [id number] Date of Birth/Sex: Treating RN: [birth year] (58 y.o. Janyth Contes Primary Care Jhayden Demuro: Meryle Ready Other Clinician: Referring Damilola Flamm: Treating Laurent Cargile/Extender:Robson, [facility], Romelle Starcher Weeks in Treatment: 12 Compression  Therapy Performed for Wound Wound #1 Left,Medial Lower Leg Assessment: Performed By: Clinician Levan Hurst, RN Compression Type: Three Layer Post Procedure Diagnosis Same as Pre-procedure Electronic Signature(s) Signed: 12/08/2018 6:00:55 PM By: Levan Hurst RN, BSN Entered By: Levan Hurst on 12/07/2018 15:31:50 -------------------------------------------------------------------------------- Encounter Discharge Information Details Patient Name: Date of Service: Russell Collier, Russell Collier 12/07/2018 2:00 PM Medical Record [id number] Patient Account Number: [id number] Date of Birth/Sex: Treating RN: 01-30-1961 (58 y.o. [name] Primary Care Halston Kintz: [place], Romelle Starcher Other Clinician: Referring Eyleen Rawlinson: Treating Reginald Mangels/Extender:Robson, Peggyann Juba, Jairo Ben in Treatment: 12 Encounter Discharge Information Items Post Procedure Vitals Discharge Condition: Stable Temperature (F): 97.6 Ambulatory Status: Ambulatory Pulse (bpm): 70 Discharge Destination: Home Respiratory Rate (breaths/min): 18 Transportation: Private Auto Blood Pressure (mmHg): 126/74 Accompanied By: self Schedule Follow-up Appointment: Yes Clinical Summary of Care: Patient Declined Electronic Signature(s) Signed: 12/07/2018 6:42:53 PM By: Kela Millin Entered By: Kela Millin on 12/07/2018 15:52:07 -------------------------------------------------------------------------------- Lower Extremity Assessment Details Patient Name: Date of Service: Russell Collier, Russell Collier 12/07/2018 2:00 PM Medical Record [id number] Patient Account Number: [id number] Date of Birth/Sex: Treating RN: 1960-04-06 (58 y.o. [name] Primary Care Lacee Grey: [place], Romelle Starcher Other Clinician: Referring Illianna Paschal: Treating Jamesina Gaugh/Extender:Robson, Peggyann Juba, Romelle Starcher Weeks in Treatment: 12 Edema Assessment Assessed: [Left: No] [Right: No] Edema: [Left: Ye] [Right: s] Calf Left: Right: Point of  Measurement: cm From Medial Instep 41.3 cm cm Ankle Left: Right: Point of Measurement: cm From Medial Instep 22.6 cm cm Vascular Assessment Pulses: Dorsalis Pedis Palpable: [Left:Yes] Electronic Signature(s) Signed: 12/07/2018 6:42:53 PM By: Kela Millin Entered By: Kela Millin on 12/07/2018 15:17:08 -------------------------------------------------------------------------------- Multi Wound Chart Details Patient Name: Date of Service: Russell Collier, Russell Collier 12/07/2018 2:00 PM Medical Record [id number] Patient Account Number: [id number] Date of Birth/Sex: Treating RN: February 15, 1961 (58 y.o. Janyth Contes Primary Care Filippa Yarbough: Meryle Ready Other Clinician: Referring Elain Wixon: Treating Rotunda Worden/Extender:Robson, Peggyann Juba, Romelle Starcher Weeks in Treatment: 12 Vital Signs Height(in): 71 Pulse(bpm): 70 Weight(lbs): 320 Blood Pressure(mmHg): 126/74 Body Mass Index(BMI): 45 Temperature(F): 97.6 Respiratory 18 Rate(breaths/min): Photos: [1:No Photos] [N/A:N/A] Wound Location: [1:Left  Lower Leg - Medial] [N/A:N/A] Wounding Event: [1:Trauma] [N/A:N/A] Primary Etiology: [1:Venous Leg Ulcer] [N/A:N/A] Comorbid History: [1:Hemophilia, Asthma, Sleep Apnea, Angina, Congestive Heart Failure, Coronary Artery Disease, Hypertension, Osteoarthritis, Neuropathy] [N/A:N/A] Date Acquired: [1:07/13/2018] [N/A:N/A] Weeks of Treatment: [1:12] [N/A:N/A] Wound Status: [1:Open] [N/A:N/A] Measurements L x W x D 0.9x0.5x0.1 [N/A:N/A] (cm) Area (cm) : [1:0.353] [N/A:N/A] Volume (cm) : [1:0.035] [N/A:N/A] % Reduction in Area: [1:93.40%] [N/A:N/A] % Reduction in Volume: [1:93.50%] [N/A:N/A] Classification: [1:Full Thickness Without Exposed Support Structures] [N/A:N/A] Exudate Amount: [1:Small] [N/A:N/A] Exudate Type: [1:Sanguinous] [N/A:N/A] Exudate Color: [1:red] [N/A:N/A] Wound Margin: [1:Distinct, outline attached N/A] Granulation Amount: [1:Large (67-100%)]  [N/A:N/A] Granulation Quality: [1:Red, Pink] [N/A:N/A] Necrotic Amount: [1:Small (1-33%)] [N/A:N/A] Exposed Structures: [1:Fat Layer (Subcutaneous N/A Tissue) Exposed: Yes Fascia: No Tendon: No Muscle: No Joint: No Bone: No] Epithelialization: [1:Small (1-33%)] [N/A:N/A] Debridement: [1:Debridement - Excisional N/A] Pre-procedure [1:15:30] [N/A:N/A] Verification/Time Out Taken: Tissue Debrided: [1:Necrotic/Eschar, Subcutaneous] [N/A:N/A] Level: [1:Skin/Subcutaneous Tissue] [N/A:N/A] Debridement Area (sq cm):0.45 [N/A:N/A] Instrument: [1:Curette] [N/A:N/A] Bleeding: [1:Minimum] [N/A:N/A] Hemostasis Achieved: [1:Pressure] [N/A:N/A] Procedural Pain: [1:0] [N/A:N/A] Post Procedural Pain: [1:0] [N/A:N/A] Debridement Treatment Procedure was tolerated [N/A:N/A] Response: [1:well] Post Debridement [1:0.9x0.5x0.1] [N/A:N/A] Measurements L x W x D (cm) Post Debridement [1:0.035] [N/A:N/A] Volume: (cm) Procedures Performed: Compression Therapy [1:Debridement] [N/A:N/A] Treatment Notes Electronic Signature(s) Signed: 12/07/2018 5:35:18 PM By: Linton Ham MD Signed: 12/08/2018 6:00:55 PM By: Levan Hurst RN, BSN Entered By: Linton Ham on 12/07/2018 15:49:28 -------------------------------------------------------------------------------- Multi-Disciplinary Care Plan Details Patient Name: Date of Service: Russell Collier, Russell Collier 12/07/2018 2:00 PM Medical Record [id number] Patient Account Number: [id number] Date of Birth/Sex: Treating RN: 1960-11-27 (58 y.o. Janyth Contes Primary Care Holdyn Poyser: Meryle Ready Other Clinician: Referring Javia Dillow: Treating Darlena Koval/Extender:Robson, Peggyann Juba, Romelle Starcher Weeks in Treatment: 12 Active Inactive Pain, Acute or Chronic Nursing Diagnoses: Pain, acute or chronic: actual or potential Potential alteration in comfort, pain Goals: Patient will verbalize adequate pain control and receive pain control interventions during  procedures as needed Date Initiated: 09/14/2018 Target Resolution Date: 12/22/2018 Goal Status: Active Patient/caregiver will verbalize comfort level met Date Initiated: 09/14/2018 Date Inactivated: 11/09/2018 Target Resolution Date: 11/24/2018 Goal Status: Met Interventions: Provide education on pain management Reposition patient for comfort Treatment Activities: Administer pain control measures as ordered : 09/14/2018 Notes: Wound/Skin Impairment Nursing Diagnoses: Knowledge deficit related to ulceration/compromised skin integrity Goals: Patient/caregiver will verbalize understanding of skin care regimen Date Initiated: 09/14/2018 Date Inactivated: 10/26/2018 Target Resolution Date: 11/03/2018 Goal Status: Met Ulcer/skin breakdown will heal within 14 weeks Date Initiated: 09/14/2018 Target Resolution Date: 01/19/2019 Goal Status: Active Interventions: Assess patient/caregiver ability to obtain necessary supplies Assess patient/caregiver ability to perform ulcer/skin care regimen upon admission and as needed Provide education on smoking Provide education on ulcer and skin care Treatment Activities: Skin care regimen initiated : 09/14/2018 Topical wound management initiated : 09/14/2018 Notes: Electronic Signature(s) Signed: 12/08/2018 6:00:55 PM By: Levan Hurst RN, BSN Entered By: Levan Hurst on 12/07/2018 15:30:01 -------------------------------------------------------------------------------- Pain Assessment Details Patient Name: Date of Service: Russell Collier, Russell Collier 12/07/2018 2:00 PM Medical Record [id number] Patient Account Number: [id number] Date of Birth/Sex: Treating RN: [birth year] (58 y.o. [name] Primary Care Ezrael Sam: [place], Romelle Starcher Other Clinician: Referring Xavious Sharrar: Treating Jakirah Zaun/Extender:Robson, Peggyann Juba, Romelle Starcher Weeks in Treatment: 12 Active Problems Location of Pain Severity and Description of Pain Patient Has Paino No Site  Locations Pain Management and Medication Current Pain Management: Electronic Signature(s) Signed: 12/07/2018 6:42:53 PM By: Kela Millin Entered By: Kela Millin on 12/07/2018 15:16:24 -------------------------------------------------------------------------------- Patient/Caregiver Education Details Patient Name: Date of Service: Russell Collier 10/15/2020andnbsp2:00 PM  Medical Record [id number] Patient Account Number: [id number] Date of Birth/Gender: 10/13/1960 (58 y.o. M) Treating RN: Levan Hurst Primary Care Physician: Meryle Ready Other Clinician: Referring Physician: Treating Physician/Extender:Robson, Peggyann Juba, Jairo Ben in Treatment: 12 Education Assessment Education Provided To: Patient Education Topics Provided Wound/Skin Impairment: Methods: Explain/Verbal Responses: State content correctly Electronic Signature(s) Signed: 12/08/2018 6:00:55 PM By: Levan Hurst RN, BSN Entered By: Levan Hurst on 12/07/2018 15:14:31 -------------------------------------------------------------------------------- Wound Assessment Details Patient Name: Date of Service: Russell Collier, Russell Collier 12/07/2018 2:00 PM Medical Record [id number] Patient Account Number: [id number] Date of Birth/Sex: Treating RN: 1961/01/23 (58 y.o. [name] Primary Care Pessy Delamar: [place], Romelle Starcher Other Clinician: Referring Dody Smartt: Treating Alyshia Kernan/Extender:Robson, Peggyann Juba, Romelle Starcher Weeks in Treatment: 12 Wound Status Wound Number: 1 Primary Venous Leg Ulcer Etiology: Wound Location: Left Lower Leg - Medial Wound Open Wounding Event: Trauma Status: Date Acquired: 07/13/2018 Comorbid Hemophilia, Asthma, Sleep Apnea, Angina, Weeks Of Treatment: 12 History: Congestive Heart Failure, Coronary Artery Clustered Wound: No Disease, Hypertension, Osteoarthritis, Neuropathy Photos Wound Measurements Length: (cm) 0.9 % Reducti Width: (cm) 0.5 %  Reducti Depth: (cm) 0.1 Epithelia Area: (cm) 0.353 Tunnelin Volume: (cm) 0.035 Undermi Wound Description Classification: Full Thickness Without Exposed Support Foul Od Structures Slough/ Wound Distinct, outline attached Margin: Exudate Small Amount: Exudate Sanguinous Type: Exudate red Color: Wound Bed Granulation Amount: Large (67-100%) Granulation Quality: Red, Pink Fascia Necrotic Amount: Small (1-33%) Fat Lay Necrotic Quality: Adherent Slough Tendon Muscle Joint E Bone Ex or After Cleansing: No Fibrino Yes Exposed Structure Exposed: No er (Subcutaneous Tissue) Exposed: Yes Exposed: No Exposed: No xposed: No posed: No on in Area: 93.4% on in Volume: 93.5% lization: Small (1-33%) g: No ning: No Electronic Signature(s) Signed: 12/12/2018 1:32:19 PM By: Mikeal Hawthorne EMT/HBOT Signed: 12/12/2018 6:16:57 PM By: Kela Millin Previous Signature: 12/07/2018 6:42:53 PM Version By: Kela Millin Entered By: Mikeal Hawthorne on 12/11/2018 09:24:55 -------------------------------------------------------------------------------- Vitals Details Patient Name: Date of Service: Russell Collier, Russell Collier 12/07/2018 2:00 PM Medical Record [id number] Patient Account Number: [id number] Date of Birth/Sex: Treating RN: November 29, 1960 (58 y.o. [name] Primary Care Ileanna Gemmill: [place], Romelle Starcher Other Clinician: Referring Braylon Grenda: Treating Nakeita Styles/Extender:Robson, Peggyann Juba, Romelle Starcher Weeks in Treatment: 12 Vital Signs Time Taken: 15:05 Temperature (F): 97.6 Height (in): 71 Pulse (bpm): 70 Weight (lbs): 320 Respiratory Rate (breaths/min): 18 Body Mass Index (BMI): 44.6 Blood Pressure (mmHg): 126/74 Reference Range: 80 - 120 mg / dl Electronic Signature(s) Signed: 12/07/2018 6:42:53 PM By: Kela Millin Entered By: Kela Millin on 12/07/2018 15:11:56

## 2018-12-08 NOTE — Telephone Encounter (Signed)
Prescription for Vyndamax sent to [facility]. Medication is preferred drug on [place] [employer]. Copay will be $3.00.   Audry Riles, PharmD, BCPS, CPP [facility] Pharmacist [phone]

## 2018-12-08 NOTE — Progress Notes (Signed)
Russell Collier, Russell Collier ([id number]) Visit Report for 12/07/2018 Debridement Details Patient Name: Date of Service: Russell Collier, Russell Collier 12/07/2018 2:00 PM Medical Record [id number] Patient Account Number: [id number] Date of Birth/Sex: 1960/11/28 (58 y.o. M) Treating RN: Levan Hurst Primary Care Provider: [facility], Romelle Starcher Other Clinician: Referring Provider: Treating Provider/Extender:Korin Hartwell, Peggyann Juba, Romelle Starcher Weeks in Treatment: 12 Debridement Performed for Wound #1 Left,Medial Lower Leg Assessment: Performed By: Physician Ricard Dillon., MD Debridement Type: Debridement Severity of Tissue Pre Fat layer exposed Debridement: Level of Consciousness (Pre- Awake and Alert procedure): Pre-procedure Verification/Time Out Taken: Yes - 15:30 Start Time: 15:30 Total Area Debrided (L x W): 0.9 (cm) x 0.5 (cm) = 0.45 (cm) Tissue and other material Viable, Non-Viable, Eschar, Subcutaneous, Skin: Dermis debrided: Level: Skin/Subcutaneous Tissue Debridement Description: Excisional Instrument: Curette Bleeding: Minimum Hemostasis Achieved: Pressure End Time: 15:31 Procedural Pain: 0 Post Procedural Pain: 0 Response to Treatment: Procedure was tolerated well Level of Consciousness Awake and Alert (Post-procedure): Post Debridement Measurements of Total Wound Length: (cm) 0.9 Width: (cm) 0.5 Depth: (cm) 0.1 Volume: (cm) 0.035 Character of Wound/Ulcer Post Improved Debridement: Severity of Tissue Post Debridement: Fat layer exposed Post Procedure Diagnosis Same as Pre-procedure Electronic Signature(s) Signed: 12/07/2018 5:35:18 PM By: Linton Ham MD Signed: 12/08/2018 6:00:55 PM By: Levan Hurst RN, BSN Entered By: Linton Ham on 12/07/2018 15:50:04 -------------------------------------------------------------------------------- HPI Details Patient Name: Date of Service: Russell Collier, Russell Collier 12/07/2018 2:00 PM Medical Record [id number] Patient Account Number:  [id number] Date of Birth/Sex: Treating RN: 05/17/1960 (58 y.o. Russell Collier Primary Care Provider: Meryle Ready Other Clinician: Referring Provider: Treating Provider/Extender:Jannetta Massey, Peggyann Juba, Romelle Starcher Weeks in Treatment: 12 History of Present Illness HPI Description: ADMISSION 09/14/2018 This is a 58 year old man who traumatized his left anterior tibia area on the bathtub. He describes this initially as a bruise that subsequently reopened. He has been seen in the ER several times and has had at least 3 courses of antibiotics including doxy and Keflex twice. He has been using Neosporin and peroxide on this. Past medical history includes obstructive sleep apnea, cellulitis, hypertension, coronary artery disease, peripheral neuropathy [nondiabetic], currently on a heart murmur for atypical chest pain and bradycardia. 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 ARTERIAL STUDIES done in March 2020 showed an ABI on the left of 0.93, TBI on the left slightly reduced at 0.42. On the right his ABI was 0.95 with a TBI of 0.51 he has monophasic waveforms in the lower extremities bilaterally. Interpreted on the left this showing mild left lower extremity arterial disease as well as on the right. 09/25/18-Patient returns at 1 week to the clinic, noted that arterial studies done in March 2020. We have been using Iodoflex to the wound on the left leg, patient has significant degree of pain and declines to have any debridement 8/21; the patient has not been here in 3 weeks. Since he was last here he was admitted to hospital on 8/4. He was felt to have stable angina. He has since been to his cardiologist. I note that he has an EF of about 48%. He has a venous wound on the left anterior mid tibia area probably with lymphedema. We have been using Iodoflex. He does not tolerate debridement well. He tells me that his cardiologist has been adjusting his Lasix and then the wraps fall down he tries to pull  them up. He is describing pain making it difficult for him to function. 9/3; patient is was hospitalized since he was last here with COPD exacerbation. Came in with Aquacel Ag on the  wound. The wound actually looks improved. 9/17; patient was apparently in the ER again with breathing issues and blood pressure issues. He was not admitted. We are using Sorbact of the wound with nice improvement dimensions are better 10/1; the patient's wound is smaller and appears to have a reasonable healthy surface. We are using Sorbact 10/8; the wound is smaller although there is still an area with depth and necrotic debris at the base. We are using Sorbact 10/15; not much change in the wound area. We have been using Sorbact. Electronic Signature(s) Signed: 12/07/2018 5:35:18 PM By: Linton Ham MD Entered By: Linton Ham on 12/07/2018 15:50:54 -------------------------------------------------------------------------------- Physical Exam Details Patient Name: Date of Service: Russell Collier, Russell Collier 12/07/2018 2:00 PM Medical Record [id number] Patient Account Number: [id number] Date of Birth/Sex: Treating RN: [birth year] (58 y.o. Russell Collier Primary Care Provider: Meryle Ready Other Clinician: Referring Provider: Treating Provider/Extender:Yevette Knust, [facility], Romelle Starcher Weeks in Treatment: 12 Constitutional Sitting or standing Blood Pressure is within target range for patient.. Pulse regular and within target range for patient.Marland Kitchen Respirations regular, non-labored and within target range.. Temperature is normal and within the target range for the patient.Marland Kitchen Appears in no distress. Notes Wound exam; left anterior shin. Aggressive debridement removing callus dry circumferential skin and debris on the wound this really cleans up quite nicely and appears to have a healthy granulated base without evidence of infection Electronic Signature(s) Signed: 12/07/2018 5:35:18 PM By: Linton Ham  MD Entered By: Linton Ham on 12/07/2018 15:52:47 -------------------------------------------------------------------------------- Physician Orders Details Patient Name: Date of Service: AMAHRI, BONGO 12/07/2018 2:00 PM Medical Record [id number] Patient Account Number: [id number] Date of Birth/Sex: Treating RN: 1960-04-16 (58 y.o. Russell Collier Primary Care Provider: Meryle Ready Other Clinician: Referring Provider: Treating Provider/Extender:Helmer Dull, Peggyann Juba, Jairo Ben in Treatment: 12 Verbal / Phone Orders: No Diagnosis Coding ICD-10 Coding Code Description [id number] Non-pressure chronic ulcer of other part of left lower leg with fat layer exposed S80.12XD Contusion of left lower leg, subsequent encounter I87.322 Chronic venous hypertension (idiopathic) with inflammation of left lower extremity Follow-up Appointments Return Appointment in 1 week. Dressing Change Frequency Wound #1 Left,Medial Lower Leg Do not change entire dressing for one week. Skin Barriers/Peri-Wound Care Wound #1 Left,Medial Lower Leg Moisturizing lotion - mixed with TCA.to leg Wound Cleansing Wound #1 Left,Medial Lower Leg May shower with protection. Primary Wound Dressing Wound #1 Left,Medial Lower Leg Endoform - moisten with saline Secondary Dressing Wound #1 Left,Medial Lower Leg Foam - pad up ankle area for protection. Dry Gauze Edema Control 3 Layer Compression System - Left Lower Extremity - apply unna boot upper portion of lower leg. Avoid standing for long periods of time Elevate legs to the level of the heart or above for 30 minutes daily and/or when sitting, a frequency of: - throughout the day. elevated feet while sitting and working on computer as well. Exercise regularly Additional Orders / Instructions Stop/Decrease Smoking Follow Nutritious Diet - increase protein and vegetables within your cardiac diet. Electronic Signature(s) Signed: 12/07/2018 5:35:18  PM By: Linton Ham MD Signed: 12/08/2018 6:00:55 PM By: Levan Hurst RN, BSN Entered By: Levan Hurst on 12/07/2018 15:33:55 -------------------------------------------------------------------------------- Problem List Details Patient Name: Date of Service: BEVAN, BUGGY 12/07/2018 2:00 PM Medical Record [id number] Patient Account Number: [id number] Date of Birth/Sex: Treating RN: 07-07-1960 (58 y.o. Russell Collier Primary Care Provider: Meryle Ready Other Clinician: Referring Provider: Treating Provider/Extender:Yancy Hascall, [facility], Jairo Ben in Treatment: 12 Active Problems ICD-10 Evaluated Encounter Code Description Active Date Today Diagnosis L97.822 Non-pressure chronic  ulcer of other part of left lower 09/14/2018 No Yes leg with fat layer exposed S80.12XD Contusion of left lower leg, subsequent encounter 09/14/2018 No Yes I87.322 Chronic venous hypertension (idiopathic) with 09/14/2018 No Yes inflammation of left lower extremity Inactive Problems Resolved Problems Electronic Signature(s) Signed: 12/07/2018 5:35:18 PM By: Linton Ham MD Entered By: Linton Ham on 12/07/2018 15:49:14 -------------------------------------------------------------------------------- Progress Note Details Patient Name: Date of Service: LANGDON, SIMINGTON 12/07/2018 2:00 PM Medical Record [id number] Patient Account Number: [id number] Date of Birth/Sex: Treating RN: May 23, 1960 (58 y.o. Russell Collier Primary Care Provider: Meryle Ready Other Clinician: Referring Provider: Treating Provider/Extender:Tryce Surratt, [facility], Romelle Starcher Weeks in Treatment: 12 Subjective History of Present Illness (HPI) ADMISSION 09/14/2018 This is a 58 year old man who traumatized his left anterior tibia area on the bathtub. He describes this initially as a bruise that subsequently reopened. He has been seen in the ER several times and has had at least 3 courses  of antibiotics including doxy and Keflex twice. He has been using Neosporin and peroxide on this. Past medical history includes obstructive sleep apnea, cellulitis, hypertension, coronary artery disease, peripheral neuropathy [nondiabetic], currently on a heart murmur for atypical chest pain and bradycardia. 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 ARTERIAL STUDIES done in March 2020 showed an ABI on the left of 0.93, TBI on the left slightly reduced at 0.42. On the right his ABI was 0.95 with a TBI of 0.51 he has monophasic waveforms in the lower extremities bilaterally. Interpreted on the left this showing mild left lower extremity arterial disease as well as on the right. 09/25/18-Patient returns at 1 week to the clinic, noted that arterial studies done in March 2020. We have been using Iodoflex to the wound on the left leg, patient has significant degree of pain and declines to have any debridement 8/21; the patient has not been here in 3 weeks. Since he was last here he was admitted to hospital on 8/4. He was felt to have stable angina. He has since been to his cardiologist. I note that he has an EF of about 48%. He has a venous wound on the left anterior mid tibia area probably with lymphedema. We have been using Iodoflex. He does not tolerate debridement well. He tells me that his cardiologist has been adjusting his Lasix and then the wraps fall down he tries to pull them up. He is describing pain making it difficult for him to function. 9/3; patient is was hospitalized since he was last here with COPD exacerbation. Came in with Aquacel Ag on the wound. The wound actually looks improved. 9/17; patient was apparently in the ER again with breathing issues and blood pressure issues. He was not admitted. We are using Sorbact of the wound with nice improvement dimensions are better 10/1; the patient's wound is smaller and appears to have a reasonable healthy surface. We are using Sorbact 10/8;  the wound is smaller although there is still an area with depth and necrotic debris at the base. We are using Sorbact 10/15; not much change in the wound area. We have been using Sorbact. Objective Constitutional Sitting or standing Blood Pressure is within target range for patient.. Pulse regular and within target range for patient.Marland Kitchen Respirations regular, non-labored and within target range.. Temperature is normal and within the target range for the patient.Marland Kitchen Appears in no distress. Vitals Time Taken: 3:05 PM, Height: 71 in, Weight: 320 lbs, BMI: 44.6, Temperature: 97.6 F, Pulse: 70 bpm, Respiratory Rate: 18 breaths/min, Blood Pressure: 126/74 mmHg. General Notes:  Wound exam; left anterior shin. Aggressive debridement removing callus dry circumferential skin and debris on the wound this really cleans up quite nicely and appears to have a healthy granulated base without evidence of infection Integumentary (Hair, Skin) Wound #1 status is Open. Original cause of wound was Trauma. The wound is located on the Left,Medial Lower Leg. The wound measures 0.9cm length x 0.5cm width x 0.1cm depth; 0.353cm^2 area and 0.035cm^3 volume. There is Fat Layer (Subcutaneous Tissue) Exposed exposed. There is no tunneling or undermining noted. There is a small amount of sanguinous drainage noted. The wound margin is distinct with the outline attached to the wound base. There is large (67-100%) red, pink granulation within the wound bed. There is a small (1-33%) amount of necrotic tissue within the wound bed including Adherent Slough. Assessment Active Problems ICD-10 Non-pressure chronic ulcer of other part of left lower leg with fat layer exposed Contusion of left lower leg, subsequent encounter Chronic venous hypertension (idiopathic) with inflammation of left lower extremity Procedures Wound #1 Pre-procedure diagnosis of Wound #1 is a Venous Leg Ulcer located on the Left,Medial Lower Leg .Severity  of Tissue Pre Debridement is: Fat layer exposed. There was a Excisional Skin/Subcutaneous Tissue Debridement with a total area of 0.45 sq cm performed by Ricard Dillon., MD. With the following instrument(s): Curette to remove Viable and Non-Viable tissue/material. Material removed includes Eschar, Subcutaneous Tissue, and Skin: Dermis. No specimens were taken. A time out was conducted at 15:30, prior to the start of the procedure. A Minimum amount of bleeding was controlled with Pressure. The procedure was tolerated well with a pain level of 0 throughout and a pain level of 0 following the procedure. Post Debridement Measurements: 0.9cm length x 0.5cm width x 0.1cm depth; 0.035cm^3 volume. Character of Wound/Ulcer Post Debridement is improved. Severity of Tissue Post Debridement is: Fat layer exposed. Post procedure Diagnosis Wound #1: Same as Pre-Procedure Pre-procedure diagnosis of Wound #1 is a Venous Leg Ulcer located on the Left,Medial Lower Leg . There was a Three Layer Compression Therapy Procedure by Levan Hurst, RN. Post procedure Diagnosis Wound #1: Same as Pre-Procedure Plan Follow-up Appointments: Return Appointment in 1 week. Dressing Change Frequency: Wound #1 Left,Medial Lower Leg: Do not change entire dressing for one week. Skin Barriers/Peri-Wound Care: Wound #1 Left,Medial Lower Leg: Moisturizing lotion - mixed with TCA.to leg Wound Cleansing: Wound #1 Left,Medial Lower Leg: May shower with protection. Primary Wound Dressing: Wound #1 Left,Medial Lower Leg: Endoform - moisten with saline Secondary Dressing: Wound #1 Left,Medial Lower Leg: Foam - pad up ankle area for protection. Dry Gauze Edema Control: 3 Layer Compression System - Left Lower Extremity - apply unna boot upper portion of lower leg. Avoid standing for long periods of time Elevate legs to the level of the heart or above for 30 minutes daily and/or when sitting, a frequency of: -  throughout the day. elevated feet while sitting and working on computer as well. Exercise regularly Additional Orders / Instructions: Stop/Decrease Smoking Follow Nutritious Diet - increase protein and vegetables within your cardiac diet. 1. Change the primary dressing to endoform covered with foam, covered with 3 layer compression Electronic Signature(s) Signed: 12/07/2018 5:35:18 PM By: Linton Ham MD Entered By: Linton Ham on 12/07/2018 15:53:22 -------------------------------------------------------------------------------- SuperBill Details Patient Name: Date of Service: LEVIATHAN, JOSIE 12/07/2018 Medical Record [id number] Patient Account Number: [id number] Date of Birth/Sex: Treating RN: [birth year] (58 y.o. Russell Collier Primary Care Provider: Meryle Ready Other Clinician: Referring Provider: Treating Provider/Extender:Anayia Eugene, [facility],  BETHANY Weeks in Treatment: 12 Diagnosis Coding ICD-10 Codes Code Description [phone] Non-pressure chronic ulcer of other part of left lower leg with fat layer exposed S80.12XD Contusion of left lower leg, subsequent encounter I87.322 Chronic venous hypertension (idiopathic) with inflammation of left lower extremity Facility Procedures CPT4 Code Description: [id number] - DEB SUBQ TISSUE 20 SQ CM/< ICD-10 Diagnosis Description L97.822 Non-pressure chronic ulcer of other part of left lower leg Modifier: with fat laye Quantity: 1 r exposed Physician Procedures CPT4 Code Description: [id number] 11042 - WC PHYS SUBQ TISS 20 SQ CM ICD-10 Diagnosis Description L97.822 Non-pressure chronic ulcer of other part of left lower leg Modifier: with fat laye Quantity: 1 r exposed Electronic Signature(s) Signed: 12/07/2018 5:35:18 PM By: Linton Ham MD Entered By: Linton Ham on 12/07/2018 15:53:39

## 2018-12-11 ENCOUNTER — Telehealth (HOSPITAL_COMMUNITY): Payer: Self-pay | Admitting: Pharmacy Technician

## 2018-12-11 ENCOUNTER — Encounter: Payer: Medicaid Other | Admitting: Speech Pathology

## 2018-12-11 NOTE — Telephone Encounter (Signed)
No prior authorization required for Tafamadis at this time. Was able to onboard patient into the [facility]. [facility] will be mailing him this medication every month. They will call him monthly to check in with him and make sure he is being adherent on Tafamadis and that he isn't experiencing any adverse side effects.   Patient was happy with this news and the ability to mail the medication to him. Explained to him that they would call him to set up shipping his medicine but he needed to make sure he answered the phone. Advised him to call me with any issues.  Specialty pharmacy phone number: [phone]  Will follow up as needed.  Charlann Boxer, CPhT

## 2018-12-12 ENCOUNTER — Other Ambulatory Visit (HOSPITAL_COMMUNITY): Payer: Self-pay | Admitting: Internal Medicine

## 2018-12-13 ENCOUNTER — Encounter: Payer: Medicaid Other | Admitting: Speech Pathology

## 2018-12-13 ENCOUNTER — Other Ambulatory Visit: Payer: Self-pay | Admitting: Gastroenterology

## 2018-12-13 DIAGNOSIS — R12 Heartburn: Secondary | ICD-10-CM | POA: Diagnosis not present

## 2018-12-13 DIAGNOSIS — K219 Gastro-esophageal reflux disease without esophagitis: Secondary | ICD-10-CM | POA: Diagnosis not present

## 2018-12-14 ENCOUNTER — Encounter (HOSPITAL_BASED_OUTPATIENT_CLINIC_OR_DEPARTMENT_OTHER): Payer: Medicaid Other | Admitting: Internal Medicine

## 2018-12-14 ENCOUNTER — Other Ambulatory Visit: Payer: Self-pay

## 2018-12-14 DIAGNOSIS — I2510 Atherosclerotic heart disease of native coronary artery without angina pectoris: Secondary | ICD-10-CM | POA: Diagnosis not present

## 2018-12-14 DIAGNOSIS — L97822 Non-pressure chronic ulcer of other part of left lower leg with fat layer exposed: Secondary | ICD-10-CM | POA: Diagnosis not present

## 2018-12-14 DIAGNOSIS — G629 Polyneuropathy, unspecified: Secondary | ICD-10-CM | POA: Diagnosis not present

## 2018-12-14 DIAGNOSIS — G4730 Sleep apnea, unspecified: Secondary | ICD-10-CM | POA: Diagnosis not present

## 2018-12-14 DIAGNOSIS — I87332 Chronic venous hypertension (idiopathic) with ulcer and inflammation of left lower extremity: Secondary | ICD-10-CM | POA: Diagnosis not present

## 2018-12-14 DIAGNOSIS — I509 Heart failure, unspecified: Secondary | ICD-10-CM | POA: Diagnosis not present

## 2018-12-14 DIAGNOSIS — I110 Hypertensive heart disease with heart failure: Secondary | ICD-10-CM | POA: Diagnosis not present

## 2018-12-14 DIAGNOSIS — I87312 Chronic venous hypertension (idiopathic) with ulcer of left lower extremity: Secondary | ICD-10-CM | POA: Diagnosis not present

## 2018-12-14 NOTE — Progress Notes (Signed)
MACARIUS, RUSZKOWSKI ([id number]) Visit Report for 12/14/2018 Debridement Details Patient Name: Date of Service: Russell Collier, Russell Collier 12/14/2018 3:30 PM Medical Record [id number] Patient Account Number: [id number] Date of Birth/Sex: 04-19-1960 (58 y.o. M) Treating RN: Deon Pilling Primary Care Provider: CENTER, Romelle Starcher Other Clinician: Referring Provider: Treating Provider/Extender:Robson, Peggyann Juba, Romelle Starcher Weeks in Treatment: 13 Debridement Performed for Wound #1 Left,Medial Lower Leg Assessment: Performed By: Physician Ricard Dillon., MD Debridement Type: Debridement Severity of Tissue Pre Fat layer exposed Debridement: Level of Consciousness (Pre- Awake and Alert procedure): Pre-procedure Verification/Time Out Taken: Yes - 16:25 Start Time: 16:26 Pain Control: Lidocaine 4% Topical Solution Total Area Debrided (L x W): 0.4 (cm) x 0.5 (cm) = 0.2 (cm) Tissue and other material Non-Viable, Skin: Dermis , Fibrin/Exudate debrided: Level: Skin/Dermis Debridement Description: Selective/Open Wound Instrument: Curette Bleeding: Minimum Hemostasis Achieved: Pressure End Time: 16:29 Procedural Pain: 0 Post Procedural Pain: 0 Response to Treatment: Procedure was tolerated well Level of Consciousness Awake and Alert (Post-procedure): Post Debridement Measurements of Total Wound Length: (cm) 0.4 Width: (cm) 0.5 Depth: (cm) 0.1 Volume: (cm) 0.016 Character of Wound/Ulcer Post Improved Debridement: Severity of Tissue Post Debridement: Fat layer exposed Post Procedure Diagnosis Same as Pre-procedure Electronic Signature(s) Signed: 12/14/2018 6:29:04 PM By: Linton Ham MD Signed: 12/14/2018 6:35:09 PM By: Deon Pilling Entered By: Linton Ham on 12/14/2018 17:43:48 -------------------------------------------------------------------------------- HPI Details Patient Name: Date of Service: Russell Collier 12/14/2018 3:30 PM Medical Record [id number] Patient  Account Number: [id number] Date of Birth/Sex: Treating RN: 1960-08-31 (58 y.o. [name] Primary Care Provider: Meryle Ready Other Clinician: Referring Provider: Treating Provider/Extender:Robson, Peggyann Juba, Romelle Starcher Weeks in Treatment: 13 History of Present Illness HPI Description: ADMISSION 09/14/2018 This is a 58 year old man who traumatized his left anterior tibia area on the bathtub. He describes this initially as a bruise that subsequently reopened. He has been seen in the ER several times and has had at least 3 courses of antibiotics including doxy and Keflex twice. He has been using Neosporin and peroxide on this. Past medical history includes obstructive sleep apnea, cellulitis, hypertension, coronary artery disease, peripheral neuropathy [nondiabetic], currently on a heart murmur for atypical chest pain and bradycardia. 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 ARTERIAL STUDIES done in March 2020 showed an ABI on the left of 0.93, TBI on the left slightly reduced at 0.42. On the right his ABI was 0.95 with a TBI of 0.51 he has monophasic waveforms in the lower extremities bilaterally. Interpreted on the left this showing mild left lower extremity arterial disease as well as on the right. 09/25/18-Patient returns at 1 week to the clinic, noted that arterial studies done in March 2020. We have been using Iodoflex to the wound on the left leg, patient has significant degree of pain and declines to have any debridement 8/21; the patient has not been here in 3 weeks. Since he was last here he was admitted to hospital on 8/4. He was felt to have stable angina. He has since been to his cardiologist. I note that he has an EF of about 48%. He has a venous wound on the left anterior mid tibia area probably with lymphedema. We have been using Iodoflex. He does not tolerate debridement well. He tells me that his cardiologist has been adjusting his Lasix and then the wraps fall down  he tries to pull them up. He is describing pain making it difficult for him to function. 9/3; patient is was hospitalized since he was last here with COPD exacerbation. Came in with Aquacel  Ag on the wound. The wound actually looks improved. 9/17; patient was apparently in the ER again with breathing issues and blood pressure issues. He was not admitted. We are using Sorbact of the wound with nice improvement dimensions are better 10/1; the patient's wound is smaller and appears to have a reasonable healthy surface. We are using Sorbact 10/8; the wound is smaller although there is still an area with depth and necrotic debris at the base. We are using Sorbact 10/15; not much change in the wound area. We have been using Sorbact. 10/22; small wound area using endoform since last week Electronic Signature(s) Signed: 12/14/2018 6:29:04 PM By: Linton Ham MD Entered By: Linton Ham on 12/14/2018 17:47:22 -------------------------------------------------------------------------------- Physical Exam Details Patient Name: Date of Service: Russell Collier 12/14/2018 3:30 PM Medical Record [id number] Patient Account Number: [id number] Date of Birth/Sex: Treating RN: [birth year] (58 y.o. [name] Primary Care Provider: Meryle Ready Other Clinician: Referring Provider: Treating Provider/Extender:Robson, [facility], Romelle Starcher Weeks in Treatment: 28 Constitutional Patient is hypertensive.. Pulse regular and within target range for patient.Marland Kitchen Respirations regular, non-labored and within target range.. Temperature is normal and within the target range for the patient.Marland Kitchen Appears in no distress. Respiratory work of breathing is normal. Cardiovascular Pedal pulses palpable on the left. We have marginal edema control. Integumentary (Hair, Skin) No erythema around the wound. Psychiatric appears at normal baseline. Notes Wound exam; left anterior shin I removed some dry callus  and circumferential debris from around the wound cleans up quite nicely healthy looking small wound with not a lot of depth. Electronic Signature(s) Signed: 12/14/2018 6:29:04 PM By: Linton Ham MD Entered By: Linton Ham on 12/14/2018 17:48:23 -------------------------------------------------------------------------------- Physician Orders Details Patient Name: Date of Service: ZEQUAN, POPKO 12/14/2018 3:30 PM Medical Record [id number] Patient Account Number: [id number] Date of Birth/Sex: Treating RN: 05-12-1960 (58 y.o. [name] Primary Care Provider: Jacobus, Romelle Starcher Other Clinician: Referring Provider: Treating Provider/Extender:Robson, Peggyann Juba, Jairo Ben in Treatment: 75 Verbal / Phone Orders: No Diagnosis Coding ICD-10 Coding Code Description [phone] Non-pressure chronic ulcer of other part of left lower leg with fat layer exposed S80.12XD Contusion of left lower leg, subsequent encounter I87.322 Chronic venous hypertension (idiopathic) with inflammation of left lower extremity Follow-up Appointments Return Appointment in 2 weeks. - thursday Nurse Visit: - next week. Dressing Change Frequency Wound #1 Left,Medial Lower Leg Do not change entire dressing for one week. Skin Barriers/Peri-Wound Care Wound #1 Left,Medial Lower Leg Moisturizing lotion - mixed with TCA to leg in clinic. patient to lotion right leg. Wound Cleansing Wound #1 Left,Medial Lower Leg May shower with protection. Primary Wound Dressing Wound #1 Left,Medial Lower Leg Endoform - moisten with saline Secondary Dressing Wound #1 Left,Medial Lower Leg Foam - pad up ankle area for protection. Dry Gauze Edema Control 3 Layer Compression System - Left Lower Extremity - apply unna boot upper portion of lower leg. Avoid standing for long periods of time Elevate legs to the level of the heart or above for 30 minutes daily and/or when sitting, a frequency of: - throughout  the day. elevated feet while sitting and working on computer as well. Exercise regularly Support Garment 20-30 mm/Hg pressure to: - patient to purchase compression stockings from [employer]. Patient to bring in on next visit. Additional Orders / Instructions Stop/Decrease Smoking Follow Nutritious Diet - increase protein and vegetables within your cardiac diet. Electronic Signature(s) Signed: 12/14/2018 6:29:04 PM By: Linton Ham MD Signed: 12/14/2018 6:35:09 PM By: Deon Pilling Entered By: Deon Pilling on  12/14/2018 16:32:57 -------------------------------------------------------------------------------- Problem List Details Patient Name: Date of Service: TAYVIEN, KINLAW 12/14/2018 3:30 PM Medical Record [id number] Patient Account Number: [id number] Date of Birth/Sex: Treating RN: 1960/05/15 (58 y.o. [name] Primary Care Provider: Chandler, Romelle Starcher Other Clinician: Referring Provider: Treating Provider/Extender:Robson, Peggyann Juba, Jairo Ben in Treatment: 13 Active Problems ICD-10 Evaluated Encounter Code Description Active Date Today Diagnosis L97.822 Non-pressure chronic ulcer of other part of left lower 09/14/2018 No Yes leg with fat layer exposed S80.12XD Contusion of left lower leg, subsequent encounter 09/14/2018 No Yes I87.322 Chronic venous hypertension (idiopathic) with 09/14/2018 No Yes inflammation of left lower extremity Inactive Problems Resolved Problems Electronic Signature(s) Signed: 12/14/2018 6:29:04 PM By: Linton Ham MD Entered By: Linton Ham on 12/14/2018 17:42:30 -------------------------------------------------------------------------------- Progress Note Details Patient Name: Date of Service: RYLAND, MICHALSKI 12/14/2018 3:30 PM Medical Record [id number] Patient Account Number: [id number] Date of Birth/Sex: Treating RN: 09/01/1960 (58 y.o. [name] Primary Care Provider: Meryle Ready Other  Clinician: Referring Provider: Treating Provider/Extender:Robson, [facility], Romelle Starcher Weeks in Treatment: 13 Subjective History of Present Illness (HPI) ADMISSION 09/14/2018 This is a 58 year old man who traumatized his left anterior tibia area on the bathtub. He describes this initially as a bruise that subsequently reopened. He has been seen in the ER several times and has had at least 3 courses of antibiotics including doxy and Keflex twice. He has been using Neosporin and peroxide on this. Past medical history includes obstructive sleep apnea, cellulitis, hypertension, coronary artery disease, peripheral neuropathy [nondiabetic], currently on a heart murmur for atypical chest pain and bradycardia. 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 ARTERIAL STUDIES done in March 2020 showed an ABI on the left of 0.93, TBI on the left slightly reduced at 0.42. On the right his ABI was 0.95 with a TBI of 0.51 he has monophasic waveforms in the lower extremities bilaterally. Interpreted on the left this showing mild left lower extremity arterial disease as well as on the right. 09/25/18-Patient returns at 1 week to the clinic, noted that arterial studies done in March 2020. We have been using Iodoflex to the wound on the left leg, patient has significant degree of pain and declines to have any debridement 8/21; the patient has not been here in 3 weeks. Since he was last here he was admitted to hospital on 8/4. He was felt to have stable angina. He has since been to his cardiologist. I note that he has an EF of about 48%. He has a venous wound on the left anterior mid tibia area probably with lymphedema. We have been using Iodoflex. He does not tolerate debridement well. He tells me that his cardiologist has been adjusting his Lasix and then the wraps fall down he tries to pull them up. He is describing pain making it difficult for him to function. 9/3; patient is was hospitalized since he was last  here with COPD exacerbation. Came in with Aquacel Ag on the wound. The wound actually looks improved. 9/17; patient was apparently in the ER again with breathing issues and blood pressure issues. He was not admitted. We are using Sorbact of the wound with nice improvement dimensions are better 10/1; the patient's wound is smaller and appears to have a reasonable healthy surface. We are using Sorbact 10/8; the wound is smaller although there is still an area with depth and necrotic debris at the base. We are using Sorbact 10/15; not much change in the wound area. We have been using Sorbact. 10/22; small wound area using endoform  since last week Objective Constitutional Patient is hypertensive.. Pulse regular and within target range for patient.Marland Kitchen Respirations regular, non-labored and within target range.. Temperature is normal and within the target range for the patient.Marland Kitchen Appears in no distress. Vitals Time Taken: 3:43 PM, Height: 71 in, Weight: 320 lbs, BMI: 44.6, Temperature: 98.6 F, Pulse: 92 bpm, Respiratory Rate: 18 breaths/min, Blood Pressure: 145/96 mmHg. Respiratory work of breathing is normal. Cardiovascular Pedal pulses palpable on the left. We have marginal edema control. Psychiatric appears at normal baseline. General Notes: Wound exam; left anterior shin I removed some dry callus and circumferential debris from around the wound cleans up quite nicely healthy looking small wound with not a lot of depth. Integumentary (Hair, Skin) No erythema around the wound. Wound #1 status is Open. Original cause of wound was Trauma. The wound is located on the Left,Medial Lower Leg. The wound measures 0.4cm length x 0.5cm width x 0.1cm depth; 0.157cm^2 area and 0.016cm^3 volume. There is Fat Layer (Subcutaneous Tissue) Exposed exposed. There is no tunneling or undermining noted. There is a small amount of serosanguineous drainage noted. The wound margin is distinct with the outline attached  to the wound base. There is medium (34-66%) pink granulation within the wound bed. There is a medium (34-66%) amount of necrotic tissue within the wound bed including Adherent Slough. Assessment Active Problems ICD-10 Non-pressure chronic ulcer of other part of left lower leg with fat layer exposed Contusion of left lower leg, subsequent encounter Chronic venous hypertension (idiopathic) with inflammation of left lower extremity Procedures Wound #1 Pre-procedure diagnosis of Wound #1 is a Venous Leg Ulcer located on the Left,Medial Lower Leg .Severity of Tissue Pre Debridement is: Fat layer exposed. There was a Selective/Open Wound Skin/Dermis Debridement with a total area of 0.2 sq cm performed by Ricard Dillon., MD. With the following instrument(s): Curette to remove Non-Viable tissue/material. Material removed includes Skin: Dermis and Fibrin/Exudate and after achieving pain control using Lidocaine 4% Topical Solution. A time out was conducted at 16:25, prior to the start of the procedure. A Minimum amount of bleeding was controlled with Pressure. The procedure was tolerated well with a pain level of 0 throughout and a pain level of 0 following the procedure. Post Debridement Measurements: 0.4cm length x 0.5cm width x 0.1cm depth; 0.016cm^3 volume. Character of Wound/Ulcer Post Debridement is improved. Severity of Tissue Post Debridement is: Fat layer exposed. Post procedure Diagnosis Wound #1: Same as Pre-Procedure Plan Follow-up Appointments: Return Appointment in 2 weeks. - thursday Nurse Visit: - next week. Dressing Change Frequency: Wound #1 Left,Medial Lower Leg: Do not change entire dressing for one week. Skin Barriers/Peri-Wound Care: Wound #1 Left,Medial Lower Leg: Moisturizing lotion - mixed with TCA to leg in clinic. patient to lotion right leg. Wound Cleansing: Wound #1 Left,Medial Lower Leg: May shower with protection. Primary Wound Dressing: Wound #1  Left,Medial Lower Leg: Endoform - moisten with saline Secondary Dressing: Wound #1 Left,Medial Lower Leg: Foam - pad up ankle area for protection. Dry Gauze Edema Control: 3 Layer Compression System - Left Lower Extremity - apply unna boot upper portion of lower leg. Avoid standing for long periods of time Elevate legs to the level of the heart or above for 30 minutes daily and/or when sitting, a frequency of: - throughout the day. elevated feet while sitting and working on computer as well. Exercise regularly Support Garment 20-30 mm/Hg pressure to: - patient to purchase compression stockings from [employer]. Patient to bring in on next visit.  Additional Orders / Instructions: Stop/Decrease Smoking Follow Nutritious Diet - increase protein and vegetables within your cardiac diet. 1. We continued with the endoform/foam under 3 layer compression 2. We talked to him today about stockings and gave him the number for [employer] in [place] Electronic Signature(s) Signed: 12/14/2018 6:29:04 PM By: Linton Ham MD Entered By: Linton Ham on 12/14/2018 17:52:20 -------------------------------------------------------------------------------- SuperBill Details Patient Name: Date of Service: HUGHIE, MARTINZ 12/14/2018 Medical Record [id number] Patient Account Number: [id number] Date of Birth/Sex: Treating RN: [birth year] (58 y.o. [name] Primary Care Provider: Meryle Ready Other Clinician: Referring Provider: Treating Provider/Extender:Robson, [facility], Romelle Starcher Weeks in Treatment: 13 Diagnosis Coding ICD-10 Codes Code Description [phone] Non-pressure chronic ulcer of other part of left lower leg with fat layer exposed S80.12XD Contusion of left lower leg, subsequent encounter I87.322 Chronic venous hypertension (idiopathic) with inflammation of left lower extremity Facility Procedures CPT4 Code Description: [id number] - DEBRIDE WOUND 1ST 20 SQ  CM OR < ICD-10 Diagnosis Description L97.822 Non-pressure chronic ulcer of other part of left lower leg w Modifier: ith fat layer Quantity: 1 exposed Physician Procedures CPT4 Code Description: [id number] - WC PHYS DEBR WO ANESTH 20 SQ CM ICD-10 Diagnosis Description L97.822 Non-pressure chronic ulcer of other part of left lower leg w Modifier: ith fat layer Quantity: 1 exposed Electronic Signature(s) Signed: 12/14/2018 6:29:04 PM By: Linton Ham MD Entered By: Linton Ham on 12/14/2018 17:52:31

## 2018-12-18 ENCOUNTER — Telehealth (HOSPITAL_COMMUNITY): Payer: Self-pay | Admitting: Licensed Clinical Social Worker

## 2018-12-18 ENCOUNTER — Encounter: Payer: Medicaid Other | Admitting: Speech Pathology

## 2018-12-18 ENCOUNTER — Telehealth (HOSPITAL_COMMUNITY): Payer: Self-pay | Admitting: *Deleted

## 2018-12-18 NOTE — Telephone Encounter (Signed)
Pt called CSW to inquire about transportation for COVID test that he is supposed to have on 10/28 prior to his procedure with the MD on 10/30.    Pt has Medicaid transport but they will not take him to the COVID test so he needs help with a ride.  CSW able to get him a taxi that day to get tested.  CSW will continue to follow and assist as needed  Jorge Ny, [facility] Desk#: [phone] Cell#: [phone]

## 2018-12-18 NOTE — Telephone Encounter (Signed)
Called patient and advised him that he would need to quarantine after taking his covid test. Patient said he had a dentist appointment and was not going to reschedule his dentist appt. Patient said he did not want to have his cath done now because it will interfere with his dental work. I asked patient what time his dental procedure was and he said late afternoon. I offered to check with the cath lab to see if we could do rapid testing the day of the cath so that he could keep his dental appt and cath. Pt declined and asked that I cancel his cath. Pt said he would call back to r/s after all of his dental work is taken care of. I advised patient that most dental offices require cardiac clearance before performing procedures and that he should have his heart looked at before pursuing dental work. Pt again declined cath thanked me for my call and hung up the phone.

## 2018-12-18 NOTE — Telephone Encounter (Signed)
-----   Message from Essie Hart May sent at 12/18/2018  8:04 AM EDT ----- Regarding: COVID SCREENING / Transportation Needed Good Morning!  PT called stating he was advised that needed to have a COVID test on 10/28 for his scheduled procedure on 10/30. He stated he was also told by the nurse (?) that if he needed transportation that a cab would be provided.  He stated that he would need the test in the AM on 10/28 as he has a dental appt in the PM.  I am sending this message as I didn't see a COVID test scheduled and I have not been made aware of [place] providing transportation for COVID testing. Also, it is my understanding that once the PT is tested they need to self quarantine.  The PT can be contacted at the number on file.  Thank you!

## 2018-12-19 ENCOUNTER — Other Ambulatory Visit: Payer: Self-pay

## 2018-12-19 ENCOUNTER — Emergency Department (HOSPITAL_COMMUNITY): Payer: Medicaid Other

## 2018-12-19 ENCOUNTER — Other Ambulatory Visit (HOSPITAL_COMMUNITY): Payer: Self-pay

## 2018-12-19 ENCOUNTER — Inpatient Hospital Stay (HOSPITAL_COMMUNITY)
Admission: EM | Admit: 2018-12-19 | Discharge: 2018-12-21 | DRG: 247 | Disposition: A | Discharge status: Home / Self Care | Payer: Medicaid Other | Attending: Internal Medicine | Admitting: Internal Medicine

## 2018-12-19 ENCOUNTER — Encounter (HOSPITAL_COMMUNITY): Payer: Self-pay | Admitting: Emergency Medicine

## 2018-12-19 DIAGNOSIS — I200 Unstable angina: Secondary | ICD-10-CM | POA: Diagnosis not present

## 2018-12-19 DIAGNOSIS — G629 Polyneuropathy, unspecified: Secondary | ICD-10-CM | POA: Diagnosis present

## 2018-12-19 DIAGNOSIS — J449 Chronic obstructive pulmonary disease, unspecified: Secondary | ICD-10-CM | POA: Diagnosis present

## 2018-12-19 DIAGNOSIS — Z9989 Dependence on other enabling machines and devices: Secondary | ICD-10-CM

## 2018-12-19 DIAGNOSIS — I447 Left bundle-branch block, unspecified: Secondary | ICD-10-CM | POA: Diagnosis not present

## 2018-12-19 DIAGNOSIS — I110 Hypertensive heart disease with heart failure: Secondary | ICD-10-CM | POA: Diagnosis present

## 2018-12-19 DIAGNOSIS — G4733 Obstructive sleep apnea (adult) (pediatric): Secondary | ICD-10-CM | POA: Diagnosis present

## 2018-12-19 DIAGNOSIS — Z7982 Long term (current) use of aspirin: Secondary | ICD-10-CM

## 2018-12-19 DIAGNOSIS — Z9119 Patient's noncompliance with other medical treatment and regimen: Secondary | ICD-10-CM

## 2018-12-19 DIAGNOSIS — Z79899 Other long term (current) drug therapy: Secondary | ICD-10-CM

## 2018-12-19 DIAGNOSIS — Z955 Presence of coronary angioplasty implant and graft: Secondary | ICD-10-CM

## 2018-12-19 DIAGNOSIS — R079 Chest pain, unspecified: Secondary | ICD-10-CM | POA: Diagnosis not present

## 2018-12-19 DIAGNOSIS — L97909 Non-pressure chronic ulcer of unspecified part of unspecified lower leg with unspecified severity: Secondary | ICD-10-CM

## 2018-12-19 DIAGNOSIS — F17210 Nicotine dependence, cigarettes, uncomplicated: Secondary | ICD-10-CM | POA: Diagnosis present

## 2018-12-19 DIAGNOSIS — Z8249 Family history of ischemic heart disease and other diseases of the circulatory system: Secondary | ICD-10-CM

## 2018-12-19 DIAGNOSIS — R0789 Other chest pain: Secondary | ICD-10-CM | POA: Diagnosis not present

## 2018-12-19 DIAGNOSIS — R52 Pain, unspecified: Secondary | ICD-10-CM | POA: Diagnosis not present

## 2018-12-19 DIAGNOSIS — Z6841 Body Mass Index (BMI) 40.0 and over, adult: Secondary | ICD-10-CM

## 2018-12-19 DIAGNOSIS — I43 Cardiomyopathy in diseases classified elsewhere: Secondary | ICD-10-CM | POA: Diagnosis present

## 2018-12-19 DIAGNOSIS — Z86711 Personal history of pulmonary embolism: Secondary | ICD-10-CM

## 2018-12-19 DIAGNOSIS — Z86718 Personal history of other venous thrombosis and embolism: Secondary | ICD-10-CM

## 2018-12-19 DIAGNOSIS — E854 Organ-limited amyloidosis: Secondary | ICD-10-CM | POA: Diagnosis present

## 2018-12-19 DIAGNOSIS — I25110 Atherosclerotic heart disease of native coronary artery with unstable angina pectoris: Principal | ICD-10-CM | POA: Diagnosis present

## 2018-12-19 DIAGNOSIS — I5032 Chronic diastolic (congestive) heart failure: Secondary | ICD-10-CM | POA: Diagnosis present

## 2018-12-19 DIAGNOSIS — D869 Sarcoidosis, unspecified: Secondary | ICD-10-CM | POA: Diagnosis present

## 2018-12-19 DIAGNOSIS — Z20828 Contact with and (suspected) exposure to other viral communicable diseases: Secondary | ICD-10-CM | POA: Diagnosis present

## 2018-12-19 DIAGNOSIS — R0602 Shortness of breath: Secondary | ICD-10-CM | POA: Diagnosis not present

## 2018-12-19 DIAGNOSIS — Z823 Family history of stroke: Secondary | ICD-10-CM

## 2018-12-19 DIAGNOSIS — Z833 Family history of diabetes mellitus: Secondary | ICD-10-CM

## 2018-12-19 DIAGNOSIS — E785 Hyperlipidemia, unspecified: Secondary | ICD-10-CM | POA: Diagnosis present

## 2018-12-19 LAB — CBC
HCT: 42.6 % (ref 39.0–52.0)
Hemoglobin: 13.8 g/dL (ref 13.0–17.0)
MCH: 29.2 pg (ref 26.0–34.0)
MCHC: 32.4 g/dL (ref 30.0–36.0)
MCV: 90.1 fL (ref 80.0–100.0)
Platelets: 271 10*3/uL (ref 150–400)
RBC: 4.73 MIL/uL (ref 4.22–5.81)
RDW: 15.5 % (ref 11.5–15.5)
WBC: 10.8 10*3/uL — ABNORMAL HIGH (ref 4.0–10.5)
nRBC: 0 % (ref 0.0–0.2)

## 2018-12-19 LAB — BASIC METABOLIC PANEL
Anion gap: 10 (ref 5–15)
BUN: 17 mg/dL (ref 6–20)
CO2: 24 mmol/L (ref 22–32)
Calcium: 9.2 mg/dL (ref 8.9–10.3)
Chloride: 100 mmol/L (ref 98–111)
Creatinine, Ser: 0.93 mg/dL (ref 0.61–1.24)
GFR calc Af Amer: >60 mL/min (ref >60)
GFR calc non Af Amer: >60 mL/min (ref >60)
Glucose, Bld: 139 mg/dL — ABNORMAL HIGH (ref 70–99)
Potassium: 3.8 mmol/L (ref 3.5–5.1)
Sodium: 134 mmol/L — ABNORMAL LOW (ref 135–145)

## 2018-12-19 LAB — TROPONIN I (HIGH SENSITIVITY): Troponin I (High Sensitivity): 21 ng/L — ABNORMAL HIGH (ref <18)

## 2018-12-19 LAB — BRAIN NATRIURETIC PEPTIDE: B Natriuretic Peptide: 34.1 pg/mL (ref 0.0–100.0)

## 2018-12-19 LAB — PROTIME-INR
INR: 1 (ref 0.8–1.2)
Prothrombin Time: 12.7 seconds (ref 11.4–15.2)

## 2018-12-19 MED ORDER — SODIUM CHLORIDE 0.9% FLUSH: 3.0000 mL | Freq: Once | INTRAVENOUS | Status: DC

## 2018-12-19 NOTE — ED Triage Notes (Signed)
Patient arrived with EMS from home reports left chest pain with SOB and nausea onset today , he received 324 ASA and 2 NTG sl prior to arrival , histroy of CAD/CHF , his cardiologist is Dr. Missy Sabins .

## 2018-12-20 ENCOUNTER — Encounter (HOSPITAL_BASED_OUTPATIENT_CLINIC_OR_DEPARTMENT_OTHER): Payer: Medicaid Other | Admitting: Physician Assistant

## 2018-12-20 ENCOUNTER — Encounter (HOSPITAL_COMMUNITY): Payer: Self-pay | Admitting: *Deleted

## 2018-12-20 ENCOUNTER — Other Ambulatory Visit: Payer: Self-pay

## 2018-12-20 ENCOUNTER — Telehealth (HOSPITAL_COMMUNITY): Payer: Self-pay | Admitting: Licensed Clinical Social Worker

## 2018-12-20 ENCOUNTER — Ambulatory Visit (HOSPITAL_COMMUNITY): Admission: RE | Admit: 2018-12-20 | Payer: Medicaid Other | Source: Home / Self Care | Admitting: Internal Medicine

## 2018-12-20 ENCOUNTER — Encounter: Payer: Medicaid Other | Admitting: Speech Pathology

## 2018-12-20 ENCOUNTER — Encounter (HOSPITAL_COMMUNITY)
Admission: EM | Disposition: A | Discharge status: Home / Self Care | Payer: Self-pay | Source: Home / Self Care | Attending: Internal Medicine

## 2018-12-20 DIAGNOSIS — R072 Precordial pain: Secondary | ICD-10-CM | POA: Diagnosis not present

## 2018-12-20 DIAGNOSIS — E854 Organ-limited amyloidosis: Secondary | ICD-10-CM | POA: Diagnosis present

## 2018-12-20 DIAGNOSIS — G4730 Sleep apnea, unspecified: Secondary | ICD-10-CM | POA: Diagnosis not present

## 2018-12-20 DIAGNOSIS — I43 Cardiomyopathy in diseases classified elsewhere: Secondary | ICD-10-CM | POA: Diagnosis present

## 2018-12-20 DIAGNOSIS — Z79899 Other long term (current) drug therapy: Secondary | ICD-10-CM | POA: Diagnosis not present

## 2018-12-20 DIAGNOSIS — G4733 Obstructive sleep apnea (adult) (pediatric): Secondary | ICD-10-CM | POA: Diagnosis not present

## 2018-12-20 DIAGNOSIS — J449 Chronic obstructive pulmonary disease, unspecified: Secondary | ICD-10-CM | POA: Diagnosis present

## 2018-12-20 DIAGNOSIS — I25110 Atherosclerotic heart disease of native coronary artery with unstable angina pectoris: Secondary | ICD-10-CM | POA: Diagnosis present

## 2018-12-20 DIAGNOSIS — E785 Hyperlipidemia, unspecified: Secondary | ICD-10-CM | POA: Diagnosis present

## 2018-12-20 DIAGNOSIS — I110 Hypertensive heart disease with heart failure: Secondary | ICD-10-CM | POA: Diagnosis not present

## 2018-12-20 DIAGNOSIS — Z9989 Dependence on other enabling machines and devices: Secondary | ICD-10-CM | POA: Diagnosis not present

## 2018-12-20 DIAGNOSIS — D869 Sarcoidosis, unspecified: Secondary | ICD-10-CM | POA: Diagnosis present

## 2018-12-20 DIAGNOSIS — Z8249 Family history of ischemic heart disease and other diseases of the circulatory system: Secondary | ICD-10-CM | POA: Diagnosis not present

## 2018-12-20 DIAGNOSIS — I200 Unstable angina: Secondary | ICD-10-CM | POA: Diagnosis not present

## 2018-12-20 DIAGNOSIS — Z20828 Contact with and (suspected) exposure to other viral communicable diseases: Secondary | ICD-10-CM | POA: Diagnosis present

## 2018-12-20 DIAGNOSIS — Z6841 Body Mass Index (BMI) 40.0 and over, adult: Secondary | ICD-10-CM | POA: Diagnosis not present

## 2018-12-20 DIAGNOSIS — Z86711 Personal history of pulmonary embolism: Secondary | ICD-10-CM | POA: Diagnosis not present

## 2018-12-20 DIAGNOSIS — Z955 Presence of coronary angioplasty implant and graft: Secondary | ICD-10-CM | POA: Diagnosis not present

## 2018-12-20 DIAGNOSIS — I2510 Atherosclerotic heart disease of native coronary artery without angina pectoris: Secondary | ICD-10-CM | POA: Diagnosis not present

## 2018-12-20 DIAGNOSIS — I509 Heart failure, unspecified: Secondary | ICD-10-CM | POA: Diagnosis not present

## 2018-12-20 DIAGNOSIS — I5032 Chronic diastolic (congestive) heart failure: Secondary | ICD-10-CM | POA: Diagnosis not present

## 2018-12-20 DIAGNOSIS — R0600 Dyspnea, unspecified: Secondary | ICD-10-CM

## 2018-12-20 DIAGNOSIS — Z833 Family history of diabetes mellitus: Secondary | ICD-10-CM | POA: Diagnosis not present

## 2018-12-20 DIAGNOSIS — L97909 Non-pressure chronic ulcer of unspecified part of unspecified lower leg with unspecified severity: Secondary | ICD-10-CM | POA: Diagnosis not present

## 2018-12-20 DIAGNOSIS — G629 Polyneuropathy, unspecified: Secondary | ICD-10-CM | POA: Diagnosis not present

## 2018-12-20 DIAGNOSIS — I87332 Chronic venous hypertension (idiopathic) with ulcer and inflammation of left lower extremity: Secondary | ICD-10-CM | POA: Diagnosis not present

## 2018-12-20 DIAGNOSIS — F17210 Nicotine dependence, cigarettes, uncomplicated: Secondary | ICD-10-CM | POA: Diagnosis present

## 2018-12-20 DIAGNOSIS — Z86718 Personal history of other venous thrombosis and embolism: Secondary | ICD-10-CM | POA: Diagnosis not present

## 2018-12-20 DIAGNOSIS — Z9119 Patient's noncompliance with other medical treatment and regimen: Secondary | ICD-10-CM | POA: Diagnosis not present

## 2018-12-20 DIAGNOSIS — R079 Chest pain, unspecified: Secondary | ICD-10-CM | POA: Diagnosis not present

## 2018-12-20 DIAGNOSIS — Z823 Family history of stroke: Secondary | ICD-10-CM | POA: Diagnosis not present

## 2018-12-20 DIAGNOSIS — Z7982 Long term (current) use of aspirin: Secondary | ICD-10-CM | POA: Diagnosis not present

## 2018-12-20 DIAGNOSIS — R0602 Shortness of breath: Secondary | ICD-10-CM | POA: Diagnosis not present

## 2018-12-20 DIAGNOSIS — L97822 Non-pressure chronic ulcer of other part of left lower leg with fat layer exposed: Secondary | ICD-10-CM | POA: Diagnosis not present

## 2018-12-20 HISTORY — PX: CORONARY STENT INTERVENTION: CATH118234

## 2018-12-20 HISTORY — PX: RIGHT/LEFT HEART CATH AND CORONARY ANGIOGRAPHY: CATH118266

## 2018-12-20 LAB — POCT I-STAT EG7
Acid-Base Excess: 1 mmol/L (ref 0.0–2.0)
Acid-Base Excess: 1 mmol/L (ref 0.0–2.0)
Bicarbonate: 26.3 mmol/L (ref 20.0–28.0)
Bicarbonate: 27.5 mmol/L (ref 20.0–28.0)
Bicarbonate: 27.9 mmol/L (ref 20.0–28.0)
Calcium, Ion: 1.2 mmol/L (ref 1.15–1.40)
Calcium, Ion: 1.2 mmol/L (ref 1.15–1.40)
Calcium, Ion: 1.24 mmol/L (ref 1.15–1.40)
HCT: 38 % — ABNORMAL LOW (ref 39.0–52.0)
HCT: 40 % (ref 39.0–52.0)
HCT: 42 % (ref 39.0–52.0)
Hemoglobin: 12.9 g/dL — ABNORMAL LOW (ref 13.0–17.0)
Hemoglobin: 13.6 g/dL (ref 13.0–17.0)
Hemoglobin: 14.3 g/dL (ref 13.0–17.0)
O2 Saturation: 68 %
O2 Saturation: 70 %
O2 Saturation: 70 %
Potassium: 3.5 mmol/L (ref 3.5–5.1)
Potassium: 4.1 mmol/L (ref 3.5–5.1)
Potassium: 4.2 mmol/L (ref 3.5–5.1)
Sodium: 141 mmol/L (ref 135–145)
Sodium: 141 mmol/L (ref 135–145)
Sodium: 145 mmol/L (ref 135–145)
TCO2: 28 mmol/L (ref 22–32)
TCO2: 29 mmol/L (ref 22–32)
TCO2: 30 mmol/L (ref 22–32)
pCO2, Ven: 47.4 mmHg (ref 44.0–60.0)
pCO2, Ven: 50.7 mmHg (ref 44.0–60.0)
pCO2, Ven: 54.1 mmHg (ref 44.0–60.0)
pH, Ven: 7.321 (ref 7.250–7.430)
pH, Ven: 7.342 (ref 7.250–7.430)
pH, Ven: 7.352 (ref 7.250–7.430)
pO2, Ven: 38 mmHg (ref 32.0–45.0)
pO2, Ven: 38 mmHg (ref 32.0–45.0)
pO2, Ven: 40 mmHg (ref 32.0–45.0)

## 2018-12-20 LAB — POCT I-STAT 7, (LYTES, BLD GAS, ICA,H+H)
Bicarbonate: 25.3 mmol/L (ref 20.0–28.0)
Calcium, Ion: 1.19 mmol/L (ref 1.15–1.40)
HCT: 41 % (ref 39.0–52.0)
Hemoglobin: 13.9 g/dL (ref 13.0–17.0)
O2 Saturation: 99 %
Potassium: 4 mmol/L (ref 3.5–5.1)
Sodium: 142 mmol/L (ref 135–145)
TCO2: 27 mmol/L (ref 22–32)
pCO2 arterial: 43.9 mmHg (ref 32.0–48.0)
pH, Arterial: 7.37 (ref 7.350–7.450)
pO2, Arterial: 126 mmHg — ABNORMAL HIGH (ref 83.0–108.0)

## 2018-12-20 LAB — TROPONIN I (HIGH SENSITIVITY): Troponin I (High Sensitivity): 23 ng/L — ABNORMAL HIGH (ref <18)

## 2018-12-20 LAB — POCT ACTIVATED CLOTTING TIME
Activated Clotting Time: 263 seconds
Activated Clotting Time: 357 seconds

## 2018-12-20 LAB — SARS CORONAVIRUS 2 BY RT PCR (HOSPITAL ORDER, PERFORMED IN ~~LOC~~ HOSPITAL LAB): SARS Coronavirus 2: NEGATIVE

## 2018-12-20 SURGERY — RIGHT/LEFT HEART CATH AND CORONARY ANGIOGRAPHY
Anesthesia: LOCAL

## 2018-12-20 MED ORDER — LABETALOL HCL 5 MG/ML IV SOLN: 10.0000 mg | INTRAVENOUS | Status: AC | PRN

## 2018-12-20 MED ORDER — FENTANYL CITRATE (PF) 100 MCG/2ML IJ SOLN
INTRAMUSCULAR | Status: DC | PRN
  Administered 2018-12-20 (×2): 25 ug via INTRAVENOUS

## 2018-12-20 MED ORDER — CLOPIDOGREL BISULFATE 75 MG PO TABS
75.0000 mg | ORAL_TABLET | Freq: Every day | ORAL | Status: DC
  Administered 2018-12-21: 09:00:00 75 mg via ORAL
  Filled 2018-12-20: qty 1

## 2018-12-20 MED ORDER — THE SENSUOUS HEART BOOK
Freq: Once | Status: AC
  Administered 2018-12-20: 22:00:00
  Filled 2018-12-20: qty 1

## 2018-12-20 MED ORDER — LIDOCAINE HCL (PF) 1 % IJ SOLN
INTRAMUSCULAR | Status: DC | PRN
  Administered 2018-12-20 (×2): 2 mL

## 2018-12-20 MED ORDER — FAMOTIDINE IN NACL 20-0.9 MG/50ML-% IV SOLN: INTRAVENOUS | Status: DC | PRN

## 2018-12-20 MED ORDER — ASPIRIN 81 MG PO CHEW
81.0000 mg | CHEWABLE_TABLET | Freq: Every day | ORAL | Status: DC
  Administered 2018-12-20 – 2018-12-21 (×2): 81 mg via ORAL
  Filled 2018-12-20 (×2): qty 1

## 2018-12-20 MED ORDER — FAMOTIDINE IN NACL 20-0.9 MG/50ML-% IV SOLN
INTRAVENOUS | Status: AC | PRN
  Administered 2018-12-20: 20 mg via INTRAVENOUS

## 2018-12-20 MED ORDER — SODIUM CHLORIDE 0.9% FLUSH
3.0000 mL | Freq: Two times a day (BID) | INTRAVENOUS | Status: DC
  Administered 2018-12-20: 22:00:00 3 mL via INTRAVENOUS

## 2018-12-20 MED ORDER — VERAPAMIL HCL 2.5 MG/ML IV SOLN
INTRAVENOUS | Status: DC | PRN
  Administered 2018-12-20: 17:00:00 via INTRA_ARTERIAL

## 2018-12-20 MED ORDER — ONDANSETRON HCL 4 MG/2ML IJ SOLN: 4.0000 mg | Freq: Four times a day (QID) | INTRAMUSCULAR | Status: DC | PRN

## 2018-12-20 MED ORDER — ANGIOPLASTY BOOK
Freq: Once | Status: AC
  Administered 2018-12-20: 22:00:00
  Filled 2018-12-20: qty 1

## 2018-12-20 MED ORDER — ISOSORBIDE MONONITRATE ER 30 MG PO TB24
30.0000 mg | ORAL_TABLET | Freq: Every day | ORAL | Status: DC
  Administered 2018-12-21: 30 mg via ORAL
  Filled 2018-12-20: qty 1

## 2018-12-20 MED ORDER — NITROGLYCERIN 1 MG/10 ML FOR IR/CATH LAB
INTRA_ARTERIAL | Status: DC | PRN
  Administered 2018-12-20 (×3): 200 ug via INTRACORONARY

## 2018-12-20 MED ORDER — CLOPIDOGREL BISULFATE 300 MG PO TABS
ORAL_TABLET | ORAL | Status: DC | PRN
  Administered 2018-12-20: 600 mg via ORAL

## 2018-12-20 MED ORDER — IOHEXOL 350 MG/ML SOLN
INTRAVENOUS | Status: DC | PRN
  Administered 2018-12-20: 100 mL

## 2018-12-20 MED ORDER — SODIUM CHLORIDE 0.9 % IV SOLN
INTRAVENOUS | Status: DC
  Administered 2018-12-20: 20:00:00 via INTRAVENOUS

## 2018-12-20 MED ORDER — HEPARIN SODIUM (PORCINE) 1000 UNIT/ML IJ SOLN
INTRAMUSCULAR | Status: DC | PRN
  Administered 2018-12-20: 6000 [IU] via INTRAVENOUS

## 2018-12-20 MED ORDER — NITROGLYCERIN IN D5W 200-5 MCG/ML-% IV SOLN
INTRAVENOUS | Status: AC | PRN
  Administered 2018-12-20: 10 ug/min via INTRAVENOUS

## 2018-12-20 MED ORDER — ATORVASTATIN CALCIUM 80 MG PO TABS: 80.0000 mg | ORAL_TABLET | Freq: Every day | ORAL | Status: DC

## 2018-12-20 MED ORDER — VERAPAMIL HCL 2.5 MG/ML IV SOLN
INTRAVENOUS | Status: DC | PRN
  Administered 2018-12-20: 10 mL via INTRA_ARTERIAL

## 2018-12-20 MED ORDER — DIAZEPAM 5 MG PO TABS: 5.0000 mg | ORAL_TABLET | Freq: Four times a day (QID) | ORAL | Status: DC | PRN

## 2018-12-20 MED ORDER — SODIUM CHLORIDE 0.9 % IV SOLN: 250.0000 mL | INTRAVENOUS | Status: DC | PRN

## 2018-12-20 MED ORDER — FENTANYL CITRATE (PF) 100 MCG/2ML IJ SOLN
INTRAMUSCULAR | Status: DC | PRN
  Administered 2018-12-20: 25 ug via INTRAVENOUS

## 2018-12-20 MED ORDER — SODIUM CHLORIDE 0.9 % IV SOLN
INTRAVENOUS | Status: AC | PRN
  Administered 2018-12-20: 10 mL/h via INTRAVENOUS

## 2018-12-20 MED ORDER — ONDANSETRON HCL 4 MG/2ML IJ SOLN
INTRAMUSCULAR | Status: DC | PRN
  Administered 2018-12-20: 4 mg via INTRAVENOUS

## 2018-12-20 MED ORDER — SODIUM CHLORIDE 0.9% FLUSH: 3.0000 mL | INTRAVENOUS | Status: DC | PRN

## 2018-12-20 MED ORDER — MIDAZOLAM HCL 2 MG/2ML IJ SOLN
INTRAMUSCULAR | Status: DC | PRN
  Administered 2018-12-20 (×2): 1 mg via INTRAVENOUS

## 2018-12-20 MED ORDER — HEPARIN (PORCINE) IN NACL 1000-0.9 UT/500ML-% IV SOLN
INTRAVENOUS | Status: DC | PRN
  Administered 2018-12-20 (×2): 500 mL

## 2018-12-20 MED ORDER — HEPARIN SODIUM (PORCINE) 1000 UNIT/ML IJ SOLN
INTRAMUSCULAR | Status: DC | PRN
  Administered 2018-12-20: 7000 [IU] via INTRAVENOUS
  Administered 2018-12-20: 2000 [IU] via INTRAVENOUS

## 2018-12-20 MED ORDER — ACETAMINOPHEN 325 MG PO TABS
650.0000 mg | ORAL_TABLET | ORAL | Status: DC | PRN
  Administered 2018-12-20 – 2018-12-21 (×2): 650 mg via ORAL
  Filled 2018-12-20 (×2): qty 2

## 2018-12-20 MED ORDER — HYDRALAZINE HCL 20 MG/ML IJ SOLN: 10.0000 mg | INTRAMUSCULAR | Status: AC | PRN

## 2018-12-20 MED ORDER — MIDAZOLAM HCL 2 MG/2ML IJ SOLN
INTRAMUSCULAR | Status: DC | PRN
  Administered 2018-12-20 (×3): 1 mg via INTRAVENOUS

## 2018-12-20 MED ORDER — IOHEXOL 350 MG/ML SOLN
INTRAVENOUS | Status: DC | PRN
  Administered 2018-12-20: 130 mL via INTRA_ARTERIAL

## 2018-12-20 SURGICAL SUPPLY — 23 items
BALLN SAPPHIRE 2.5X12 (BALLOONS) ×2
BALLN SAPPHIRE ~~LOC~~ 2.75X12 (BALLOONS) ×2 IMPLANT
BALLOON SAPPHIRE 2.5X12 (BALLOONS) ×1 IMPLANT
CATH 5FR JL3.5 JR4 ANG PIG MP (CATHETERS) ×2 IMPLANT
CATH BALLN WEDGE 5F 110CM (CATHETERS) ×2 IMPLANT
CATH INFINITI 5FR JL4 (CATHETERS) ×2 IMPLANT
CATH VISTA GUIDE 6FR AL1 (CATHETERS) ×4 IMPLANT
CATH VISTA GUIDE 6FR XB3.5 (CATHETERS) ×2 IMPLANT
CATH VISTA GUIDE 6FR XB4 (CATHETERS) ×2 IMPLANT
DEVICE RAD COMP TR BAND LRG (VASCULAR PRODUCTS) ×2 IMPLANT
GLIDESHEATH SLEND SS 6F .021 (SHEATH) ×2 IMPLANT
GUIDEWIRE .025 260CM (WIRE) ×2 IMPLANT
GUIDEWIRE INQWIRE 1.5J.035X260 (WIRE) ×1 IMPLANT
HOVERMATT SINGLE USE (MISCELLANEOUS) ×2 IMPLANT
INQWIRE 1.5J .035X260CM (WIRE) ×2
KIT ENCORE 26 ADVANTAGE (KITS) ×2 IMPLANT
KIT HEART LEFT (KITS) ×2 IMPLANT
PACK CARDIAC CATHETERIZATION (CUSTOM PROCEDURE TRAY) ×2 IMPLANT
SHEATH GLIDE SLENDER 4/5FR (SHEATH) ×2 IMPLANT
STENT RESOLUTE ONYX 2.75X15 (Permanent Stent) ×2 IMPLANT
TRANSDUCER W/STOPCOCK (MISCELLANEOUS) ×2 IMPLANT
TUBING CIL FLEX 10 FLL-RA (TUBING) ×2 IMPLANT
WIRE COUGAR XT STRL 190CM (WIRE) ×2 IMPLANT

## 2018-12-20 NOTE — ED Triage Notes (Signed)
DC MD at bedside to speak with Pt and Pt's Family  that is on the Telephone.

## 2018-12-20 NOTE — Interval H&P Note (Signed)
History and Physical Interval Note:  12/20/2018 4:54 PM  Russell Collier  has presented today for surgery, with the diagnosis of heart failure.  The various methods of treatment have been discussed with the patient and family. After consideration of risks, benefits and other options for treatment, the patient has consented to  Procedure(s): RIGHT/LEFT HEART CATH AND CORONARY ANGIOGRAPHY (N/A) and possible coronary angioplasty as a surgical intervention.  The patient's history has been reviewed, patient examined, no change in status, stable for surgery.  I have reviewed the patient's chart and labs.  Questions were answered to the patient's satisfaction.     Daniel [name]

## 2018-12-20 NOTE — Telephone Encounter (Signed)
CSW called pt to verify if he still needed a taxi to go get COVID tested today- pt reports he had to go to the ED as he wasn't feeling well.  CSW will continue to follow and assist as needed- pt hopeful that since he is in the hospital they can go ahead and COVID test him and maybe admit him for his procedure.  CSW will continue to follow and assist as needed  Jorge Ny, [facility] Desk#: [phone] Cell#: [phone]

## 2018-12-20 NOTE — ED Notes (Signed)
Consent form signed.
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
## 2018-12-21 ENCOUNTER — Encounter (HOSPITAL_COMMUNITY): Payer: Self-pay | Admitting: Internal Medicine

## 2018-12-21 ENCOUNTER — Encounter (HOSPITAL_BASED_OUTPATIENT_CLINIC_OR_DEPARTMENT_OTHER): Payer: Medicaid Other | Admitting: Internal Medicine

## 2018-12-21 ENCOUNTER — Ambulatory Visit (HOSPITAL_BASED_OUTPATIENT_CLINIC_OR_DEPARTMENT_OTHER): Payer: Medicaid Other | Admitting: Internal Medicine

## 2018-12-21 ENCOUNTER — Other Ambulatory Visit: Payer: Self-pay

## 2018-12-21 DIAGNOSIS — I509 Heart failure, unspecified: Secondary | ICD-10-CM | POA: Diagnosis not present

## 2018-12-21 DIAGNOSIS — I2510 Atherosclerotic heart disease of native coronary artery without angina pectoris: Secondary | ICD-10-CM | POA: Diagnosis not present

## 2018-12-21 DIAGNOSIS — G4733 Obstructive sleep apnea (adult) (pediatric): Secondary | ICD-10-CM

## 2018-12-21 DIAGNOSIS — L97909 Non-pressure chronic ulcer of unspecified part of unspecified lower leg with unspecified severity: Secondary | ICD-10-CM

## 2018-12-21 DIAGNOSIS — G629 Polyneuropathy, unspecified: Secondary | ICD-10-CM | POA: Diagnosis not present

## 2018-12-21 DIAGNOSIS — I87332 Chronic venous hypertension (idiopathic) with ulcer and inflammation of left lower extremity: Secondary | ICD-10-CM | POA: Diagnosis not present

## 2018-12-21 DIAGNOSIS — I200 Unstable angina: Secondary | ICD-10-CM

## 2018-12-21 DIAGNOSIS — Z955 Presence of coronary angioplasty implant and graft: Secondary | ICD-10-CM

## 2018-12-21 DIAGNOSIS — Z9989 Dependence on other enabling machines and devices: Secondary | ICD-10-CM

## 2018-12-21 DIAGNOSIS — I110 Hypertensive heart disease with heart failure: Secondary | ICD-10-CM | POA: Diagnosis not present

## 2018-12-21 DIAGNOSIS — L97822 Non-pressure chronic ulcer of other part of left lower leg with fat layer exposed: Secondary | ICD-10-CM | POA: Diagnosis not present

## 2018-12-21 DIAGNOSIS — I5032 Chronic diastolic (congestive) heart failure: Secondary | ICD-10-CM

## 2018-12-21 DIAGNOSIS — G4730 Sleep apnea, unspecified: Secondary | ICD-10-CM | POA: Diagnosis not present

## 2018-12-21 LAB — BASIC METABOLIC PANEL
Anion gap: 8 (ref 5–15)
BUN: 13 mg/dL (ref 6–20)
CO2: 25 mmol/L (ref 22–32)
Calcium: 8.7 mg/dL — ABNORMAL LOW (ref 8.9–10.3)
Chloride: 106 mmol/L (ref 98–111)
Creatinine, Ser: 1.01 mg/dL (ref 0.61–1.24)
GFR calc Af Amer: >60 mL/min (ref >60)
GFR calc non Af Amer: >60 mL/min (ref >60)
Glucose, Bld: 121 mg/dL — ABNORMAL HIGH (ref 70–99)
Potassium: 4.1 mmol/L (ref 3.5–5.1)
Sodium: 139 mmol/L (ref 135–145)

## 2018-12-21 LAB — CBC
HCT: 38.8 % — ABNORMAL LOW (ref 39.0–52.0)
Hemoglobin: 12.5 g/dL — ABNORMAL LOW (ref 13.0–17.0)
MCH: 28.7 pg (ref 26.0–34.0)
MCHC: 32.2 g/dL (ref 30.0–36.0)
MCV: 89 fL (ref 80.0–100.0)
Platelets: 233 10*3/uL (ref 150–400)
RBC: 4.36 MIL/uL (ref 4.22–5.81)
RDW: 15.5 % (ref 11.5–15.5)
WBC: 7.7 10*3/uL (ref 4.0–10.5)
nRBC: 0 % (ref 0.0–0.2)

## 2018-12-21 MED ORDER — CLOPIDOGREL BISULFATE 75 MG PO TABS: 75.0000 mg | ORAL_TABLET | Freq: Every day | ORAL | 3 refills | Status: DC

## 2018-12-21 MED ORDER — ASPIRIN 81 MG PO CHEW: 81.0000 mg | CHEWABLE_TABLET | Freq: Every day | ORAL | 3 refills | Status: DC

## 2018-12-21 MED ORDER — PANTOPRAZOLE SODIUM 40 MG PO TBEC
40.0000 mg | DELAYED_RELEASE_TABLET | Freq: Two times a day (BID) | ORAL | Status: DC
  Administered 2018-12-21: 40 mg via ORAL
  Filled 2018-12-21: qty 1

## 2018-12-21 NOTE — Discharge Summary (Signed)
Discharge Summary    Patient ID: Russell Collier MRN: [id number]; DOB: [birth year]  Admit date: 12/19/2018 Discharge date: 12/21/2018  Primary Care Provider: [facility] [place]  Primary Cardiologist: Fransico Him, MD  Primary Electrophysiologist:  None   Discharge Diagnoses    Principal Problem:   Unstable angina [facility]) Active Problems:   OSA on CPAP   Chronic diastolic heart failure ([place])   Coronary artery disease involving native coronary artery of native heart with unstable angina pectoris ([place])   Chronic skin ulcer of lower leg (H[facility])   S/P drug eluting coronary stent placement    Diagnostic Studies/Procedures    CORONARY STENT INTERVENTION  12/20/2018  Conclusion   Prox LAD lesion is 20% stenosed.  Ost 1st Diag to 1st Diag lesion is 30% stenosed.  RPAV lesion is 40% stenosed.  Mid Cx lesion is 80% stenosed.  Post intervention, there is a 0% residual stenosis.  A stent was successfully placed.  Successful percutaneous coronary intervention to the 80% mid AV groove left circumflex stenosis treated with PTCA and DES stenting with a 2.75 x 15 mm Resolute Onyx stent postdilated to 2.78 mm with the 80% stenosis being reduced to 0%.    Diagnostic Dominance: Right  Intervention      RIGHT/LEFT HEART CATH AND CORONARY ANGIOGRAPHY  12/20/2018  Conclusion   Prox LAD lesion is 20% stenosed.  Mid Cx lesion is 80% stenosed.  RPAV lesion is 40% stenosed.  Ost 1st Diag to 1st Diag lesion is 30% stenosed.  Findings:  Ao = 141/76 (101) LV = 133/13 RA = 11 RV = 46/13 PA = 42/5 (25) PCW = 11 Fick cardiac output/index = 6.1/2.4 PVR = 2.0 WU Ao sat = 99% PA sat = 68%, 70%  Assessment: 1. 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 Mild PAH with normal PVR  Plan/Discussion:  Pulmonary pressures much lower than expected. Plan PCI of LCX.  Glori Bickers, MD    _____________   History of Present Illness     Russell Collier is a 58 y.o. male  with history of morbid obesity, tobacco abuse, non-obstructive CAD, OSA, HLD and prior DVT with poorly healing LLE wound, who presents with chest pain. He has had several episodes of recurrent chest pain and did have some relief with nitroglycerin. Recently he had myoview stress testing in August 2020 which showed a small fixed apical defect, without significant reversible ischemia. A prior LHC in 01/2018 showed 20 mid-LAD stenosis, 70% mid LCx stenosis and 50% stenosis of a PLA branch vessel. Medical therapy was recommended. Subsequent amyloid study was equivocal, but he was ultimately diagnosed with ATTR amyloidosis. He is supposed to be starting on tafamadis. A right heart cath was scheduled for this Friday 10/30. Labs notable for flat minimal trop elevation of 21-23, no dynamic EKG changes. LVEF worse recently at 45-50%. On exam he was noted to be a pleasant, morbidly obese, in NAD, lungs with diminished breath sounds, distant heart sounds but regular, extremities with 1+ LLE edema (leg wrapped), follows commands, pleasant.  This is a 58 yo morbidly obese male with recurrent chest pain that is nitrate responsive and noted to have non-obstructive CAD by cath in 01/2018. He has recently been diagnosed with TTR but Dr. Haroldine Laws does not suspect cardiac involvement based on SPECT. Dr. Haroldine Laws has agreed to do the proceed with right heart cath and - reasonable to re-assess coronaries and perform R/LHC.   Hospital Course     Consultants: None  Atypical chest pain  and DOE -A prior LHC in 01/2018 showed 20 mid-LAD stenosis, 70% mid LCx stenosis and 50% stenosis of a PLA branch vessel. Medical therapy was recommended. -HS troponins low and flat. BNP not elevated. No significant EKG changes.  -Pt underwent R&L heart cath yesterday. Right heart cath showed pulmonary pressures much lower than expected. LHC showed 80% mid circ lesion which and was treated with PTCA and DES stenting with a 2.75 x 15 mm Resolute  Onyx stent postdilated to 2.78 mm with the 80% stenosis being reduced to 0%. -Recommend uninterrupted dual antiplatelet therapy with Aspirin 81mg  daily and Clopidogrel 75mg  daily for a minimum of 6 months (stable ischemic heart disease-Class I recommendation). -Right radial cath site is mildly tender, but no hematoma.  -Renal function stable this am.  -Pt reports breathing is slightly better. No current chest pain.  -Pt ambulated with cardiac rehab without chest pain. Pt would benefit from attending CR phase II.   Chronic diastolic Heart Failure -Hx of Heart failure with diastolic dysfunction. Due in part to cardiac amyloidosis+ untreated OSA. Recent stress w/ worsening EF @ 45-50%. Currently mildly hypervolemic on exam. No oxygen requirement. BNP 34 -RHC done by Dr. Haroldine Laws 12/20/18: Mild PAH with normal PVR. Pulmonary pressures much lower than expected.  -Volume status looks stable.   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 Recently started on Tafamidis (ordered but have not started taking per pt) -Continuing home meds: Tafamidis 61mg  daily (has not started yet) -Pt has F/U woth Adv HF clinic with Dr. Haroldine Laws on 12/7.  OSA -Uses CPAP intermittently. Does not use regularly at night -Consistent use of CPAP encouraged.   Chronic RLE ulcer -Follows with wound care center at [facility]. Due to dressing change today. He will go from [facility] to [facility].    Obesity -Body mass index is 44.34 kg/m. -Pt would benefit from weight loss.   Patient has been seen by Dr. Debara Pickett today and deemed ready for discharge home. All follow up appointments have been scheduled. Discharge medications are listed below.   Did the patient have an acute coronary syndrome (MI, NSTEMI, STEMI, etc) this admission?:  No                               Did the patient have a percutaneous coronary intervention (stent / angioplasty)?:  Yes.     Cath/PCI Registry Performance & Quality  Measures: 1. Aspirin prescribed? - Yes 2. ADP Receptor Inhibitor (Plavix/Clopidogrel, Brilinta/Ticagrelor or Effient/Prasugrel) prescribed (includes medically managed patients)? - Yes 3. High Intensity Statin (Lipitor 40-80mg  or Crestor 20-40mg ) prescribed? - Yes 4. For EF <40%, was ACEI/ARB prescribed? - Not Applicable (EF >/= [id number]) 5. For EF <40%, Aldosterone Antagonist (Spironolactone or Eplerenone) prescribed? - Not Applicable (EF >/= [id number]) 6. Cardiac Rehab Phase II ordered (Included Medically managed Patients)? - Yes   _____________  Discharge Vitals Blood pressure (!) 159/83, pulse 80, temperature 98 F (36.7 C), temperature source Oral, resp. rate 18, height 5\' 11"  (1.803 m), weight (!) 144.2 kg, SpO2 96 %.  Filed Weights   12/21/18 0534  Weight: (!) 144.2 kg    Labs & Radiologic Studies    CBC Recent Labs    12/19/18 2048  12/20/18 1721 12/21/18 0425  WBC 10.8*  --   --  7.7  HGB 13.8   < > 13.6 12.5*  HCT 42.6   < > 40.0 38.8*  MCV 90.1  --   --  89.0  PLT 271  --   --  233   < > = values in this interval not displayed.   Basic Metabolic Panel Recent Labs    12/19/18 2048  12/20/18 1721 12/21/18 0425  NA 134*   < > 141 139  K 3.8   < > 4.1 4.1  CL 100  --   --  106  CO2 24  --   --  25  GLUCOSE 139*  --   --  121*  BUN 17  --   --  13  CREATININE 0.93  --   --  1.01  CALCIUM 9.2  --   --  8.7*   < > = values in this interval not displayed.   Liver Function Tests No results for input(s): AST, ALT, ALKPHOS, BILITOT, PROT, ALBUMIN in the last 72 hours. No results for input(s): LIPASE, AMYLASE in the last 72 hours. High Sensitivity Troponin:   Recent Labs  Lab 12/19/18 2048 12/20/18 0938  TROPONINIHS 21* 23*    BNP Invalid input(s): POCBNP D-Dimer No results for input(s): DDIMER in the last 72 hours. Hemoglobin A1C No results for input(s): HGBA1C in the last 72 hours. Fasting Lipid Panel No results for input(s): CHOL, HDL, LDLCALC, TRIG, CHOLHDL,  LDLDIRECT in the last 72 hours. Thyroid Function Tests No results for input(s): TSH, T4TOTAL, T3FREE, THYROIDAB in the last 72 hours.  Invalid input(s): FREET3 _____________  Dg Chest 2 View  Result Date: 12/19/2018 CLINICAL DATA:  Chest pain and shortness of breath EXAM: CHEST - 2 VIEW COMPARISON:  October 31, 2018 FINDINGS: There is no evident edema or consolidation. Interstitium remains mildly thickened. Heart size and pulmonary vascular normal. No adenopathy. Spina bifida occulta at C7 is stable. IMPRESSION: Interstitial thickening, likely due to a degree of chronic bronchitis. No edema or consolidation. Stable cardiac silhouette. Electronically Signed   By: Lowella Grip III M.D.   On: 12/19/2018 21:08   Disposition   Pt is being discharged home today in good condition.  Follow-up Plans & Appointments    Follow-up Information    Charlie Pitter, PA-C Follow up.   Specialties: Cardiology, Radiology Why: Cardiology hospital follow up on 12/26/2018 at 2:00. Please arrive at 1:45 for check in.  Contact information: [address] [place] [address] [phone]        [name], [name], MD Follow up.   Specialty: Cardiology Why: Follow up on 01/29/19 at 10:40. Please arrive 15 minutes early for check in.  Contact information: [address] [place] [address] [phone]          Discharge Instructions    Amb Referral to Cardiac Rehabilitation   Complete by: As directed    Diagnosis:  Coronary Stents PTCA     After initial evaluation and assessments completed: Virtual Based Care may be provided alone or in conjunction with Phase 2 Cardiac Rehab based on patient barriers.: Yes   Diet - low sodium heart healthy   Complete by: As directed    Discharge instructions   Complete by: As directed    PLEASE REMEMBER TO BRING ALL OF [place].  PLEASE ATTEND ALL SCHEDULED  FOLLOW-UP APPOINTMENTS.   Activity: Increase activity slowly as tolerated. You may shower, but no soaking baths (or swimming) for 1 week. No driving for 24 hours. No lifting over 5 lbs for 1 week. No sexual activity for 1 week.  You May Return to Work: in 1 week (if applicable)  Wound Care: You may wash cath site gently with soap and water. Keep cath site clean and dry. If you notice pain, swelling, bleeding or pus at your cath site, please call [phone].   Increase activity slowly   Complete by: As directed       Discharge Medications   Allergies as of 12/21/2018   No Known Allergies     Medication List    STOP taking these medications   aspirin 325 MG tablet Replaced by: aspirin 81 MG chewable tablet   ibuprofen 200 MG tablet Commonly known as: ADVIL     TAKE these medications   albuterol (2.5 MG/3ML) 0.083% nebulizer solution Commonly known as: PROVENTIL Take 2.5 mg by nebulization every 6 (six) hours as needed for wheezing or shortness of breath.   albuterol 108 (90 Base) MCG/ACT inhaler Commonly known as: VENTOLIN HFA Inhale 2 puffs into the lungs every 6 (six) hours as needed for wheezing or shortness of breath.   aspirin 81 MG chewable tablet Chew 1 tablet (81 mg total) by mouth daily. Start taking on: December 22, 2018 Replaces: aspirin 325 MG tablet   atorvastatin 80 MG tablet Commonly known as: Lipitor Take 1 tablet (80 mg total) by mouth daily.   clopidogrel 75 MG tablet Commonly known as: PLAVIX Take 1 tablet (75 mg total) by mouth daily with breakfast. Start taking on: December 22, 2018   enalapril 20 MG tablet Commonly known as: VASOTEC Take 20 mg by mouth 2 (two) times daily.   fluticasone 50 MCG/ACT nasal spray Commonly known as: FLONASE Place 1 spray into both nostrils daily.   furosemide 20 MG tablet Commonly known as: LASIX Take 2 tablets (40 mg total) by mouth every morning AND 1 tablet (20 mg total) every evening. What changed: See  the new instructions.   methocarbamol 500 MG tablet Commonly known as: ROBAXIN Take 1 tablet (500 mg total) by mouth every 8 (eight) hours as needed for muscle spasms.   multivitamin with minerals Tabs tablet Take 1 tablet by mouth daily.   pantoprazole 40 MG tablet Commonly known as: PROTONIX Take 1 tablet (40 mg total) by mouth 2 (two) times daily.   potassium chloride SA 20 MEQ tablet Commonly known as: KLOR-CON Take 1 tablet (20 mEq total) by mouth daily.   spironolactone 25 MG tablet Commonly known as: ALDACTONE Take 0.5 tablets (12.5 mg total) by mouth daily.   valACYclovir 500 MG tablet Commonly known as: VALTREX Take 500 mg by mouth daily.   Vyndamax 61 MG Caps Generic drug: Tafamidis Take 1 capsule by mouth daily.          Outstanding Labs/Studies   BMet at follow up  Duration of Discharge Encounter   Greater than 30 minutes including physician time.  Signed, Daune Perch, NP 12/21/2018, 11:48 AM

## 2018-12-21 NOTE — Consult Note (Addendum)
[place] Nurse wound consult note Reason for Consult: Patient is followed by the outpatient [facility] (last seen by Dr. Laurene Footman) and had an appointment for this afternoon at 1:15pm for dressing change.  Since he was an inpatient, he cancelled appointment and is now being discharged.  Discussed with Bedside RN, Lilli Light and he is to assist patient call and arrange to be seen later this week for dressing change. According to Dr. Janalyn Rouse note from the 10/22 encounter, they are using a product (Iodosorb) that is not available in house. It would be advantageous for Dr. Dellia Nims to see 1 week post intervention. Wound type: Venous insufficiency Pressure Injury POA: N/A  Patient not seen today.  Thanks, Maudie Flakes, MSN, RN, [place], Arther Abbott  Pager# [phone]

## 2018-12-21 NOTE — Progress Notes (Signed)
Russell Collier ([id number]) Visit Report for 12/21/2018 Arrival Information Details Patient Name: Date of Service: Russell, Collier 12/21/2018 1:15 PM Medical Record [id number] Patient Account Number: [id number] Date of Birth/Sex: Treating RN: March 23, 1960 (58 y.o. Russell Collier Primary Care Russell Collier: Russell Collier Other Clinician: Referring Jerrian Mells: Treating Finlay Mills/Extender:Robson, Russell Collier, Russell Collier Weeks in Treatment: 14 Visit Information History Since Last Visit Added or deleted any medications: Yes Patient Arrived: Ambulatory Any new allergies or adverse reactions: No Arrival Time: 13:05 Had a fall or experienced change in No Accompanied By: self activities of daily living that may affect Transfer Assistance: None risk of falls: Patient Identification Verified: Yes Signs or symptoms of abuse/neglect since last No Secondary Verification Process Yes visito Completed: Hospitalized since last visit: Yes Patient Requires Transmission-Based No Implantable device outside of the clinic excluding No Precautions: cellular tissue based products placed in the center Patient Has Alerts: Yes since last visit: Patient Alerts: L ABI = Has Dressing in Place as Prescribed: Yes 0.93 Has Compression in Place as Prescribed: Yes R ABI = Pain Present Now: Yes 0.95 Electronic Signature(s) Signed: 12/21/2018 2:54:55 PM By: Baruch Gouty RN, BSN Entered By: Baruch Gouty on 12/21/2018 13:10:20 -------------------------------------------------------------------------------- Compression Therapy Details Patient Name: Date of Service: Russell Collier 12/21/2018 1:15 PM Medical Record [id number] Patient Account Number: [id number] Date of Birth/Sex: Treating RN: 1960/08/22 (58 y.o. Russell Collier Primary Care Russell Collier: Russell Collier Other Clinician: Referring Russell Collier: Treating Areej Tayler/Extender:Robson, Russell Collier Weeks in Treatment:  14 Compression Therapy Performed for Wound Wound #1 Left,Medial Lower Leg Assessment: Performed By: Clinician Baruch Gouty, RN Compression Type: Three Hydrologist) Signed: 12/21/2018 2:54:55 PM By: Baruch Gouty RN, BSN Entered By: Baruch Gouty on 12/21/2018 [id number] -------------------------------------------------------------------------------- Encounter Discharge Information Details Patient Name: Date of Service: Russell, Collier 12/21/2018 1:15 PM Medical Record [id number] Patient Account Number: [id number] Date of Birth/Sex: Treating RN: 1960/07/03 (58 y.o. Russell Collier Primary Care Kalise Fickett: [place], Russell Collier Other Clinician: Referring Navjot Pilgrim: Treating Russell Collier/Extender:Robson, Russell Collier, Jairo Ben in Treatment: 14 Encounter Discharge Information Items Discharge Condition: Stable Ambulatory Status: Ambulatory Discharge Destination: Home Transportation: Private Auto Accompanied By: self Schedule Follow-up Appointment: Yes Clinical Summary of Care: Patient Declined Electronic Signature(s) Signed: 12/21/2018 2:54:55 PM By: Baruch Gouty RN, BSN Entered By: Baruch Gouty on 12/21/2018 13:30:36 -------------------------------------------------------------------------------- Pain Assessment Details Patient Name: Date of Service: Russell, Collier 12/21/2018 1:15 PM Medical Record [id number] Patient Account Number: [id number] Date of Birth/Sex: Treating RN: Jul 01, 1960 (58 y.o. Russell Collier Primary Care Darl Kuss: [place], Russell Collier Other Clinician: Referring Alexie Lanni: Treating Russell Collier/Extender:Robson, [facility], Russell Collier Weeks in Treatment: 14 Active Problems Location of Pain Severity and Description of Pain Patient Has Paino Yes Site Locations Pain Location: Pain in Ulcers With Dressing Change: Yes Duration of the Pain. Constant / Intermittento Intermittent Rate the pain. Current Pain Level: 6 Worst Pain  Level: 7 Character of Pain Describe the Pain: Aching Pain Management and Medication Current Pain Management: Is the Current Pain Management Adequate: Adequate How does your wound impact your activities of daily livingo Sleep: No Bathing: No Appetite: No Relationship With Others: No Bladder Continence: No Emotions: No Bowel Continence: No Work: No Toileting: No Drive: No Dressing: No Hobbies: No Engineer, maintenance) Signed: 12/21/2018 2:54:55 PM By: Baruch Gouty RN, BSN Entered By: Baruch Gouty on 12/21/2018 13:31:33 -------------------------------------------------------------------------------- Patient/Caregiver Education Details Patient Name: Russell Collier 10/29/2020andnbsp1:15 Date of Service: PM Medical Record [id number] Number: Patient Account Number: [id number] Treating RN: Date of Birth/Gender: [birth year] (58 y.o. Russell Collier) Other Clinician: Primary Care Physician: Russell Collier Treating Russell Collier Referring Physician: Physician/Extender: Russell Collier in Treatment: 14 Education Assessment Education Provided To: Patient Education Topics Provided Venous: Methods: Explain/Verbal Responses: Reinforcements needed, State content correctly Welcome To [facility] [place]: Electronic Signature(s) Signed: 12/21/2018 2:54:55 PM By: Baruch Gouty RN, BSN Entered By: Baruch Gouty on 12/21/2018 13:30:18 -------------------------------------------------------------------------------- Wound Assessment Details Patient Name: Date of Service: Russell, Collier 12/21/2018 1:15 PM Medical Record [id number] Patient Account Number: [id number] Date of Birth/Sex: Treating RN: [birth year] (58 y.o. Russell Collier Primary Care Rickita Forstner: [place], Russell Collier Other Clinician: Referring Emylia Latella: Treating Leith Hedlund/Extender:Robson, Russell Collier, Russell Collier Weeks in Treatment: 14 Wound Status Wound Number: 1 Primary Venous Leg  Ulcer Etiology: Wound Location: Left Lower Leg - Medial Wound Open Wounding Event: Trauma Status: Date Acquired: 07/13/2018 Comorbid Hemophilia, Asthma, Sleep Apnea, Angina, Weeks Of Treatment: 14 History: Congestive Heart Failure, Coronary Artery Clustered Wound: No Disease, Hypertension, Osteoarthritis, Neuropathy Wound Measurements Length: (cm) 0.4 % Reduct Width: (cm) 0.5 % Reduct Depth: (cm) 0.1 Epitheli Area: (cm) 0.157 Tunneli Volume: (cm) 0.016 Undermi Wound Description Classification: Full Thickness Without Exposed Support Structures Wound Distinct, outline attached Margin: Exudate None Present Amount: Wound Bed Granulation Amount: Large (67-100%) Granulation Quality: Pink Necrotic Amount: None Present (0%) Foul Odor After Cleansing: No Slough/Fibrino Yes Exposed Structure Fascia Exposed: No Fat Layer (Subcutaneous Tissue) Exposed: Yes Tendon Exposed: No Muscle Exposed: No Joint Exposed: No Bone Exposed: No ion in Area: 97.1% ion in Volume: 97% alization: Small (1-33%) ng: No ning: No Treatment Notes Wound #1 (Left, Medial Lower Leg) 2. Periwound Care Moisturizing lotion 3. Primary Dressing Applied Endoform Hydrogel or K-Y Jelly 4. Secondary Dressing Dry Gauze 6. Support Layer Applied 3 layer compression Water quality scientist) Signed: 12/21/2018 2:54:55 PM By: Baruch Gouty RN, BSN Entered By: Baruch Gouty on 12/21/2018 13:27:29 -------------------------------------------------------------------------------- [place] Details Patient Name: Date of Service: Russell, Collier 12/21/2018 1:15 PM Medical Record [id number] Patient Account Number: [id number] Date of Birth/Sex: Treating RN: 05/24/1960 (58 y.o. Russell Collier Primary Care Lucia Harm: [place], Russell Collier Other Clinician: Referring Rogan Ecklund: Treating Lateshia Schmoker/Extender:Robson, Russell Collier, Russell Collier Weeks in Treatment: 14 Vital Signs Time Taken: 13:11 Temperature  (F): 98.2 Height (in): 71 Pulse (bpm): 88 Source: Stated Respiratory Rate (breaths/min): 20 Weight (lbs): 320 Blood Pressure (mmHg): 126/72 Source: Stated Capillary Blood Glucose (mg/dl): 127 Body Mass Index (BMI): 44.6 Reference Range: 80 - 120 mg / dl Notes glucose per pt report yesterday at hospital Electronic Signature(s) Signed: 12/21/2018 2:54:55 PM By: Baruch Gouty RN, BSN Entered By: Baruch Gouty on 12/21/2018 13:12:42

## 2018-12-21 NOTE — Progress Notes (Signed)
Progress Note  Patient Name: Russell Collier Date of Encounter: 12/21/2018  Primary Cardiologist: Fransico Him, MD   Subjective   Pt says that his breathing may be a little better since stent placement. No chest pain at present. He has not been up yet. He is concerned about his leg wound. He sees wound care at [facility] and was scheduled for dressing change today. I will see if the wound care nurse in the hospital can do his dressing change.  Inpatient Medications    Scheduled Meds: . aspirin  81 mg Oral Daily  . atorvastatin  80 mg Oral q1800  . clopidogrel  75 mg Oral Q breakfast  . isosorbide mononitrate  30 mg Oral Daily  . pantoprazole  40 mg Oral BID  . sodium chloride flush  3 mL Intravenous Once  . sodium chloride flush  3 mL Intravenous Q12H   Continuous Infusions: . sodium chloride 150 mL/hr at 12/20/18 1941  . sodium chloride     PRN Meds: sodium chloride, acetaminophen, diazepam, ondansetron (ZOFRAN) IV, sodium chloride flush   Vital Signs    Vitals:   12/21/18 0311 12/21/18 0411 12/21/18 0519 12/21/18 0534  BP: (!) 153/88 134/79 (!) 142/79   Pulse: 66 71 80   Resp:      Temp:      TempSrc:      SpO2: 99% 96% 96%   Weight:    (!) 144.2 kg  Height:        Intake/Output Summary (Last 24 hours) at 12/21/2018 1009 Last data filed at 12/21/2018 0536 Gross per 24 hour  Intake 603.17 ml  Output 980 ml  Net -376.83 ml   Last 3 Weights 12/21/2018 12/07/2018 11/24/2018  Weight (lbs) 317 lb 14.4 oz 319 lb 6.4 oz 325 lb  Weight (kg) 144.198 kg 144.879 kg 147.419 kg      Telemetry    In the 70s-90s- Personally Reviewed  ECG    Normal sinus rhythm, 76 bpm, LAD, Non-specific intra-ventricular conduction block, Minimal voltage criteria for LVH, may be normal variant ( Cornell product ) - No changes- Personally Reviewed  Physical Exam   GEN: Obese male, No acute distress.   Neck: No JVD Cardiac: RRR, no murmurs, rubs, or gallops.  Respiratory: Clear to  auscultation bilaterally. GI: Soft, nontender, non-distended  MS: 2+ edema; LLL wrapped Neuro:  Nonfocal  Psych: Normal affect   Labs    High Sensitivity Troponin:   Recent Labs  Lab 12/19/18 2048 12/20/18 0938  TROPONINIHS 21* 23*      Chemistry Recent Labs  Lab 12/19/18 2048  12/20/18 1712 12/20/18 1721 12/21/18 0425  NA 134*   < > 141 141 139  K 3.8   < > 4.2 4.1 4.1  CL 100  --   --   --  106  CO2 24  --   --   --  25  GLUCOSE 139*  --   --   --  121*  BUN 17  --   --   --  13  CREATININE 0.93  --   --   --  1.01  CALCIUM 9.2  --   --   --  8.7*  GFRNONAA >60  --   --   --  >60  GFRAA >60  --   --   --  >60  ANIONGAP 10  --   --   --  8   < > = values in this interval not  displayed.     Hematology Recent Labs  Lab 12/19/18 2048  12/20/18 1712 12/20/18 1721 12/21/18 0425  WBC 10.8*  --   --   --  7.7  RBC 4.73  --   --   --  4.36  HGB 13.8   < > 14.3 13.6 12.5*  HCT 42.6   < > 42.0 40.0 38.8*  MCV 90.1  --   --   --  89.0  MCH 29.2  --   --   --  28.7  MCHC 32.4  --   --   --  32.2  RDW 15.5  --   --   --  15.5  PLT 271  --   --   --  233   < > = values in this interval not displayed.    BNP Recent Labs  Lab 12/19/18 2049  BNP 34.1     DDimer No results for input(s): DDIMER in the last 168 hours.   Radiology    Dg Chest 2 View  Result Date: 12/19/2018 CLINICAL DATA:  Chest pain and shortness of breath EXAM: CHEST - 2 VIEW COMPARISON:  October 31, 2018 FINDINGS: There is no evident edema or consolidation. Interstitium remains mildly thickened. Heart size and pulmonary vascular normal. No adenopathy. Spina bifida occulta at C7 is stable. IMPRESSION: Interstitial thickening, likely due to a degree of chronic bronchitis. No edema or consolidation. Stable cardiac silhouette. Electronically Signed   By: Lowella Grip III M.D.   On: 12/19/2018 21:08    Cardiac Studies   CORONARY STENT INTERVENTION  12/20/2018  Conclusion   Prox LAD  lesion is 20% stenosed.  Ost 1st Diag to 1st Diag lesion is 30% stenosed.  RPAV lesion is 40% stenosed.  Mid Cx lesion is 80% stenosed.  Post intervention, there is a 0% residual stenosis.  A stent was successfully placed.   Successful percutaneous coronary intervention to the 80% mid AV groove left circumflex stenosis treated with PTCA and DES stenting with a 2.75 x 15 mm Resolute Onyx stent postdilated to 2.78 mm with the 80% stenosis being reduced to 0%.    RIGHT/LEFT HEART CATH AND CORONARY ANGIOGRAPHY  12/20/2018  Conclusion   Prox LAD lesion is 20% stenosed.  Mid Cx lesion is 80% stenosed.  RPAV lesion is 40% stenosed.  Ost 1st Diag to 1st Diag lesion is 30% stenosed.   Findings:  Ao = 141/76 (101) LV = 133/13 RA =  11 RV = 46/13 PA = 42/5 (25) PCW = 11 Fick cardiac output/index = 6.1/2.4 PVR = 2.0 WU Ao sat = 99% PA sat = 68%, 70%  Assessment: 1. 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 Mild PAH with normal PVR  Plan/Discussion:  Pulmonary pressures much lower than expected. Plan PCI of LCX.  Glori Bickers, MD     Patient Profile     58 y.o. male with a history of w/ PMH of tobacco use, morbid obesity, Amyloidosis, OSA, chronic diastolic heart failure, non-obstructive coronary disease, HLD, DVT and chronic lower extremity wounds presenting w/ chest pain.   Assessment & Plan    Atypical chest pain and DOE -A prior LHC in 01/2018 showed 20 mid-LAD stenosis, 70% mid LCx stenosis and 50% stenosis of a PLA branch vessel. Medical therapy was recommended. -HS troponins low and flat. BNP not elevated. No significant EKG changes.  -Pt underwent R&L heart cath yesterday. Right heart cath showed pulmonary pressures much lower than expected. LHC showed 80%  mid circ lesion which and was treated with PTCA and DES stenting with a 2.75 x 15 mm Resolute Onyx stent postdilated to 2.78 mm with the 80% stenosis being reduced to 0%. -Recommend uninterrupted dual  antiplatelet therapy with Aspirin 81mg  daily and Clopidogrel 75mg  daily for a minimum of 6 months (stable ischemic heart disease-Class I recommendation). -Right radial cath site is mildly tender, but no hematoma.  -Renal function stable this am.  -Pt reports breathing is slightly better. No current chest pain.  -Pt to work with cardiac rehab. Probably home after lunch.   Chronic diastolic Heart Failure -Hx of Heart failure with diastolic dysfunction. Due in part to cardiac amyloidosis+ untreated OSA. Recent stress w/ worsening EF @ 45-50%. Currently mildly hypervolemic on exam. No oxygen requirement. BNP 34 -RHC done yesterday by Dr. Haroldine Laws: Mild PAH with normal PVR. Pulmonary pressures much lower than expected.  -Volume status looks stable.   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 Recently started on Tafamidis (ordered but have not started taking per pt) -Continuing home meds: Tafamidis 61mg  daily  OSA -Uses CPAP intermittently. Does not use regularly at night -Consistent use of CPAP encouraged.   Chronic RLE ulcer -Follows with wound care [facility]. Due to dressing change.  -Wound care nurse consult placed for wound care today.   Obesity -Body mass index is 44.34 kg/m. -Pt would benefit from weight loss.   For questions or updates, please contact [place] Please consult [email or web address] for contact info under        Signed, Daune Perch, NP  12/21/2018, 10:09 AM

## 2018-12-21 NOTE — Progress Notes (Addendum)
CARDIAC REHAB PHASE I   PRE:  Rate/Rhythm: 86 SR    BP: sitting 144/80    SaO2:   MODE:  Ambulation: 300 ft   POST:  Rate/Rhythm: 117 ST    BP: sitting 159/83     SaO2: 98 RA  Pt with poor mobility but refused RW. Somewhat better once he was upright but fatigues with distance. HR elevated. He sts legs ache and back has major issues. Doesn't want a RW due to not wanting to be in a w/c. Ed completed. Discussed Plavix, stent, CAD, diet and specifically low sodium, smoking cessation, NTG and CRPII. He is eager to do CRPII and sts he has been trying to get in for a while. Will refer to [place]. Did not give exercise gl due to poor ability but encouraged him to move as able. Will also give HF booklet. 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 Discussed this but would benefit from more education. Also encouraged him to find more things to do with his time. He is not ready to quit smoking. Understands the importance of Plavix. [id number]   [place], ACSM 12/21/2018 10:44 AM

## 2018-12-21 NOTE — Progress Notes (Addendum)
TR BAND REMOVAL  LOCATION:    right radial  DEFLATED PER PROTOCOL:    Yes.    TIME BAND OFF / DRESSING APPLIED:    2230   SITE UPON ARRIVAL:    Level 0  SITE AFTER BAND REMOVAL:    Level 0  CIRCULATION SENSATION AND MOVEMENT:    Within Normal Limits   Yes.    COMMENTS: Pt.tolerated well ;no hematoma or bleeding noted ;with good capillary refill ;pulses palpable

## 2018-12-24 DIAGNOSIS — G4733 Obstructive sleep apnea (adult) (pediatric): Secondary | ICD-10-CM | POA: Diagnosis not present

## 2018-12-25 ENCOUNTER — Emergency Department (HOSPITAL_COMMUNITY): Payer: Medicaid Other

## 2018-12-25 ENCOUNTER — Other Ambulatory Visit: Payer: Self-pay

## 2018-12-25 ENCOUNTER — Encounter (HOSPITAL_COMMUNITY): Payer: Self-pay | Admitting: Emergency Medicine

## 2018-12-25 ENCOUNTER — Emergency Department (HOSPITAL_COMMUNITY)
Admission: EM | Admit: 2018-12-25 | Discharge: 2018-12-25 | Disposition: A | Discharge status: Home / Self Care | Payer: Medicaid Other | Attending: Emergency Medicine | Admitting: Emergency Medicine

## 2018-12-25 DIAGNOSIS — Z79899 Other long term (current) drug therapy: Secondary | ICD-10-CM | POA: Diagnosis not present

## 2018-12-25 DIAGNOSIS — Z955 Presence of coronary angioplasty implant and graft: Secondary | ICD-10-CM | POA: Insufficient documentation

## 2018-12-25 DIAGNOSIS — I110 Hypertensive heart disease with heart failure: Secondary | ICD-10-CM | POA: Insufficient documentation

## 2018-12-25 DIAGNOSIS — I208 Other forms of angina pectoris: Secondary | ICD-10-CM | POA: Insufficient documentation

## 2018-12-25 DIAGNOSIS — F17210 Nicotine dependence, cigarettes, uncomplicated: Secondary | ICD-10-CM | POA: Diagnosis not present

## 2018-12-25 DIAGNOSIS — I5032 Chronic diastolic (congestive) heart failure: Secondary | ICD-10-CM | POA: Diagnosis not present

## 2018-12-25 DIAGNOSIS — R079 Chest pain, unspecified: Secondary | ICD-10-CM | POA: Diagnosis not present

## 2018-12-25 LAB — BASIC METABOLIC PANEL
Anion gap: 10 (ref 5–15)
BUN: 12 mg/dL (ref 6–20)
CO2: 26 mmol/L (ref 22–32)
Calcium: 8.9 mg/dL (ref 8.9–10.3)
Chloride: 103 mmol/L (ref 98–111)
Creatinine, Ser: 1 mg/dL (ref 0.61–1.24)
GFR calc Af Amer: >60 mL/min (ref >60)
GFR calc non Af Amer: >60 mL/min (ref >60)
Glucose, Bld: 120 mg/dL — ABNORMAL HIGH (ref 70–99)
Potassium: 3.9 mmol/L (ref 3.5–5.1)
Sodium: 139 mmol/L (ref 135–145)

## 2018-12-25 LAB — CBC
HCT: 40.2 % (ref 39.0–52.0)
Hemoglobin: 12.8 g/dL — ABNORMAL LOW (ref 13.0–17.0)
MCH: 28.6 pg (ref 26.0–34.0)
MCHC: 31.8 g/dL (ref 30.0–36.0)
MCV: 89.9 fL (ref 80.0–100.0)
Platelets: 264 10*3/uL (ref 150–400)
RBC: 4.47 MIL/uL (ref 4.22–5.81)
RDW: 15.8 % — ABNORMAL HIGH (ref 11.5–15.5)
WBC: 10 10*3/uL (ref 4.0–10.5)
nRBC: 0 % (ref 0.0–0.2)

## 2018-12-25 LAB — HEPATIC FUNCTION PANEL
ALT: 41 U/L (ref 0–44)
AST: 27 U/L (ref 15–41)
Albumin: 3.7 g/dL (ref 3.5–5.0)
Alkaline Phosphatase: 82 U/L (ref 38–126)
Bilirubin, Direct: 0.1 mg/dL (ref 0.0–0.2)
Indirect Bilirubin: 0.7 mg/dL (ref 0.3–0.9)
Total Bilirubin: 0.8 mg/dL (ref 0.3–1.2)
Total Protein: 7 g/dL (ref 6.5–8.1)

## 2018-12-25 LAB — TROPONIN I (HIGH SENSITIVITY)
Troponin I (High Sensitivity): 29 ng/L — ABNORMAL HIGH (ref <18)
Troponin I (High Sensitivity): 30 ng/L — ABNORMAL HIGH (ref <18)

## 2018-12-25 LAB — LIPASE, BLOOD: Lipase: 26 U/L (ref 11–51)

## 2018-12-25 MED ORDER — NITROGLYCERIN 0.4 MG SL SUBL
0.4000 mg | SUBLINGUAL_TABLET | SUBLINGUAL | Status: DC | PRN
  Administered 2018-12-25: 20:00:00 0.4 mg via SUBLINGUAL
  Filled 2018-12-25: qty 1

## 2018-12-25 MED ORDER — ISOSORBIDE MONONITRATE ER 30 MG PO TB24: 30.0000 mg | ORAL_TABLET | Freq: Every day | ORAL | 0 refills | Status: DC

## 2018-12-25 MED ORDER — ISOSORBIDE MONONITRATE ER 30 MG PO TB24
30.0000 mg | ORAL_TABLET | Freq: Once | ORAL | Status: AC
  Administered 2018-12-25: 30 mg via ORAL
  Filled 2018-12-25: qty 1

## 2018-12-25 MED ORDER — SODIUM CHLORIDE 0.9% FLUSH: 3.0000 mL | Freq: Once | INTRAVENOUS | Status: DC

## 2018-12-25 NOTE — ED Notes (Signed)
Patient verbalizes understanding of discharge instructions. Opportunity for questioning and answers were provided. Armband removed by staff, pt discharged from ED ambulatory to home.  

## 2018-12-25 NOTE — ED Notes (Signed)
Phlebotomy unable to draw labs. Waiting for IV for labs

## 2018-12-25 NOTE — ED Notes (Signed)
Patient transported to X-ray 

## 2018-12-25 NOTE — ED Triage Notes (Signed)
Pt states he has been having CP ever since he had his stents placed on Oct. 28th. Denies n/v/d

## 2018-12-25 NOTE — Discharge Instructions (Addendum)
Take imdur daily as prescribed   See cardiology in a week   Follow up with GI as scheduled   Return to ER if you have worse chest pain, trouble breathing
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
## 2018-12-25 NOTE — Progress Notes (Signed)
Russell Collier, Russell Collier ([id number]) Visit Report for 12/21/2018 HxROS Details Patient Name: Date of Service: Russell Collier, Russell Collier 12/21/2018 1:15 PM Medical Record [id number] Patient Account Number: [id number] Date of Birth/Sex: Treating RN: [birth year] (58 y.o. Ernestene Mention Primary Care Provider: [place], Romelle Starcher Other Clinician: Referring Provider: Treating Provider/Extender:Poppy Mcafee, [facility], Romelle Starcher Weeks in Treatment: 14 Information Obtained From Patient Eyes Medical History: Negative for: Cataracts; Glaucoma; Optic Neuritis Hematologic/Lymphatic Medical History: Positive for: Hemophilia Negative for: Anemia; Human Immunodeficiency Virus; Lymphedema; Sickle Cell Disease Respiratory Medical History: Positive for: Asthma; Sleep Apnea Negative for: Aspiration; Chronic Obstructive Pulmonary Disease (COPD); Pneumothorax; Tuberculosis Past Medical History Notes: Pulmonary Embolism Cardiovascular Medical History: Positive for: Angina - Atypical; Congestive Heart Failure; Coronary Artery Disease; Hypertension Negative for: Arrhythmia; Deep Vein Thrombosis; Hypotension; Myocardial Infarction; Peripheral Arterial Disease; Peripheral Venous Disease; Phlebitis; Vasculitis Past Medical History Notes: Hyperlipidemia, Bradycardia Gastrointestinal Medical History: Negative for: Cirrhosis ; Colitis; Crohns; Hepatitis A; Hepatitis B; Hepatitis C Endocrine Medical History: Negative for: Type I Diabetes; Type II Diabetes Genitourinary Medical History: Negative for: End Stage Renal Disease Immunological Medical History: Negative for: Lupus Erythematosus; Raynauds; Scleroderma Integumentary (Skin) Medical History: Negative for: History of Burn Musculoskeletal Medical History: Positive for: Osteoarthritis Negative for: Gout; Rheumatoid Arthritis; Osteomyelitis Neurologic Medical History: Positive for: Neuropathy Negative for: Dementia; Quadriplegia; Paraplegia; Seizure  Disorder Oncologic Medical History: Negative for: Received Chemotherapy; Received Radiation Psychiatric Medical History: Negative for: Anorexia/bulimia; Confinement Anxiety Immunizations Pneumococcal Vaccine: Received Pneumococcal Vaccination: Yes Implantable Devices Yes Hospitalization / Surgery History Type of Hospitalization/Surgery [facility] 09[facility]2018 - Acute Resp Failure cardiac cath with stent 12/19/18 Family and Social History Current every day smoker - Cigars 1 PPD; Marital Status - Single; Alcohol Use: Rarely; Drug Use: Prior History - Cocaine; Caffeine Use: Rarely; Financial Concerns: No; Food, Clothing or Shelter Needs: No; Support System Lacking: No; Transportation Concerns: No Engineer, maintenance) Signed: 12/21/2018 2:54:55 PM By: Baruch Gouty RN, BSN Signed: 12/25/2018 8:24:05 AM By: Linton Ham MD Entered By: Baruch Gouty on 12/21/2018 13:11:33 -------------------------------------------------------------------------------- SuperBill Details Patient Name: Date of Service: Russell Collier, Russell Collier 12/21/2018 Medical Record [id number] Patient Account Number: [id number] Date of Birth/Sex: Treating RN: 1960-08-05 (58 y.o. Ernestene Mention Primary Care Provider: [place], Romelle Starcher Other Clinician: Referring Provider: Treating Provider/Extender:Juletta Berhe, [facility], Romelle Starcher Weeks in Treatment: 14 Diagnosis Coding ICD-10 Codes Code Description [phone] Non-pressure chronic ulcer of other part of left lower leg with fat layer exposed S80.12XD Contusion of left lower leg, subsequent encounter I87.322 Chronic venous hypertension (idiopathic) with inflammation of left lower extremity Facility Procedures CPT4 Code Description: [id number] (Facility Use Only) [phone] - [place] LWR LT LEG Modifier: Quantity: 1 Electronic Signature(s) Signed: 12/21/2018 2:54:55 PM By: Baruch Gouty RN, BSN Signed: 12/25/2018 8:24:05 AM By: Linton Ham MD Entered  By: Baruch Gouty on 12/21/2018 13:31:00

## 2018-12-26 ENCOUNTER — Ambulatory Visit: Payer: Medicaid Other | Admitting: Physician Assistant

## 2018-12-26 ENCOUNTER — Telehealth (HOSPITAL_COMMUNITY): Payer: Self-pay

## 2018-12-26 NOTE — Telephone Encounter (Signed)
Pt insurance is active through [place]. [id number]  Will fax over Medicaid Reimbursement form to Dr. Haroldine Laws  Will contact pt to see if he is interested in the Cardiac Rehab program. If interested, pt will need to complete f/u appt on. Once completed, pt will be contacted for scheduling upon review by the RN Navigator.

## 2018-12-28 ENCOUNTER — Encounter (HOSPITAL_BASED_OUTPATIENT_CLINIC_OR_DEPARTMENT_OTHER): Payer: Medicaid Other | Admitting: Internal Medicine

## 2018-12-28 ENCOUNTER — Other Ambulatory Visit: Payer: Self-pay

## 2018-12-28 ENCOUNTER — Encounter (HOSPITAL_BASED_OUTPATIENT_CLINIC_OR_DEPARTMENT_OTHER): Payer: Medicaid Other | Attending: Internal Medicine | Admitting: Internal Medicine

## 2018-12-28 DIAGNOSIS — I509 Heart failure, unspecified: Secondary | ICD-10-CM | POA: Insufficient documentation

## 2018-12-28 DIAGNOSIS — J441 Chronic obstructive pulmonary disease with (acute) exacerbation: Secondary | ICD-10-CM | POA: Insufficient documentation

## 2018-12-28 DIAGNOSIS — S81802A Unspecified open wound, left lower leg, initial encounter: Secondary | ICD-10-CM | POA: Diagnosis not present

## 2018-12-28 DIAGNOSIS — I2510 Atherosclerotic heart disease of native coronary artery without angina pectoris: Secondary | ICD-10-CM | POA: Diagnosis not present

## 2018-12-28 DIAGNOSIS — G629 Polyneuropathy, unspecified: Secondary | ICD-10-CM | POA: Insufficient documentation

## 2018-12-28 DIAGNOSIS — G4733 Obstructive sleep apnea (adult) (pediatric): Secondary | ICD-10-CM | POA: Insufficient documentation

## 2018-12-28 DIAGNOSIS — I110 Hypertensive heart disease with heart failure: Secondary | ICD-10-CM | POA: Insufficient documentation

## 2018-12-28 DIAGNOSIS — L97822 Non-pressure chronic ulcer of other part of left lower leg with fat layer exposed: Secondary | ICD-10-CM | POA: Diagnosis not present

## 2018-12-28 DIAGNOSIS — M1990 Unspecified osteoarthritis, unspecified site: Secondary | ICD-10-CM | POA: Diagnosis not present

## 2018-12-28 NOTE — Progress Notes (Signed)
Russell Collier, Russell Collier ([id number]) Visit Report for 12/28/2018 HPI Details Patient Name: Date of Service: Russell Collier, Russell Collier 12/28/2018 2:45 PM Medical Record [id number] Patient Account Number: [id number] Date of Birth/Sex: Treating RN: October 01, 1960 (58 y.o. Russell Collier Primary Care Provider: Meryle Collier Other Clinician: Referring Provider: Treating Provider/Extender:Russell Collier, Russell Collier, Russell Collier Weeks in Treatment: 15 History of Present Illness HPI Description: ADMISSION 09/14/2018 This is a 58 year old man who traumatized his left anterior tibia area on the bathtub. He describes this initially as a bruise that subsequently reopened. He has been seen in the ER several times and has had at least 3 courses of antibiotics including doxy and Keflex twice. He has been using Neosporin and peroxide on this. Past medical history includes obstructive sleep apnea, cellulitis, hypertension, coronary artery disease, peripheral neuropathy [nondiabetic], currently on a heart murmur for atypical chest pain and bradycardia. 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 ARTERIAL STUDIES done in March 2020 showed an ABI on the left of 0.93, TBI on the left slightly reduced at 0.42. On the right his ABI was 0.95 with a TBI of 0.51 he has monophasic waveforms in the lower extremities bilaterally. Interpreted on the left this showing mild left lower extremity arterial disease as well as on the right. 09/25/18-Patient returns at 1 week to the clinic, noted that arterial studies done in March 2020. We have been using Iodoflex to the wound on the left leg, patient has significant degree of pain and declines to have any debridement 8/21; the patient has not been here in 3 weeks. Since he was last here he was admitted to hospital on 8/4. He was felt to have stable angina. He has since been to his cardiologist. I note that he has an EF of about 48%. He has a venous wound on the left anterior mid tibia area probably with  lymphedema. We have been using Iodoflex. He does not tolerate debridement well. He tells me that his cardiologist has been adjusting his Lasix and then the wraps fall down he tries to pull them up. He is describing pain making it difficult for him to function. 9/3; patient is was hospitalized since he was last here with COPD exacerbation. Came in with Aquacel Ag on the wound. The wound actually looks improved. 9/17; patient was apparently in the ER again with breathing issues and blood pressure issues. He was not admitted. We are using Sorbact of the wound with nice improvement dimensions are better 10/1; the patient's wound is smaller and appears to have a reasonable healthy surface. We are using Sorbact 10/8; the wound is smaller although there is still an area with depth and necrotic debris at the base. We are using Sorbact 10/15; not much change in the wound area. We have been using Sorbact. 10/22; small wound area using endoform since last week 11/5; Russell Collier arrived today in a healed state. Left leg and wrapped. He still does not have compression stockings Electronic Signature(s) Signed: 12/28/2018 5:45:12 PM By: Russell Ham MD Entered By: Russell Collier on 12/28/2018 16:57:01 -------------------------------------------------------------------------------- Physical Exam Details Patient Name: Date of Service: Russell Collier, Russell Collier 12/28/2018 2:45 PM Medical Record [id number] Patient Account Number: [id number] Date of Birth/Sex: Treating RN: 1961-02-17 (58 y.o. Russell Collier Primary Care Provider: Meryle Collier Other Clinician: Referring Provider: Treating Provider/Extender:Russell Collier, Russell Collier, Russell Collier Weeks in Treatment: 15 Constitutional Patient is hypertensive.. Pulse regular and within target range for patient.Russell Kitchen Respirations regular, non-labored and within target range.. Temperature is normal and within the target range for the patient.Russell Kitchen Appears in no  distress. Cardiovascular Pedal pulses palpable and strong bilaterally.. We have good edema control in the left leg however his right leg which has not been wrapped has significant lymphedema.Russell Kitchen Psychiatric appears at normal baseline. Notes Wound exam; left anterior shin is healed. He has good epithelialization Electronic Signature(s) Signed: 12/28/2018 5:45:12 PM By: Russell Ham MD Entered By: Russell Collier on 12/28/2018 16:58:09 -------------------------------------------------------------------------------- Physician Orders Details Patient Name: Date of Service: Russell Collier, Russell Collier 12/28/2018 2:45 PM Medical Record [id number] Patient Account Number: [id number] Date of Birth/Sex: Treating RN: [birth year] (58 y.o. Russell Collier Primary Care Provider: Sandy Collier, Russell Collier Other Clinician: Referring Provider: Treating Provider/Extender:Russell Collier, Russell Collier, Jairo Collier in Treatment: 15 Verbal / Phone Orders: No Diagnosis Coding Discharge From [facility] Discharge from Russell Collier - Call if your wounds re-opens or new wound develops Skin Barriers/Peri-Wound Care Moisturizing lotion - at night, daily Edema Control Avoid standing for long periods of time Elevate legs to the level of the heart or above for 30 minutes daily and/or when sitting, a frequency of: Exercise regularly Support Garment 20-30 mm/Hg pressure to: - bilateral lower legs. apply in AM and remove at night Other: - Order elastic therapy stockings Electronic Signature(s) Signed: 12/28/2018 5:16:49 PM By: Russell Collier Signed: 12/28/2018 5:45:12 PM By: Russell Ham MD Entered By: Russell Collier on 12/28/2018 16:18:19 -------------------------------------------------------------------------------- Problem List Details Patient Name: Date of Service: Russell Collier, Russell Collier 12/28/2018 2:45 PM Medical Record [id number] Patient Account Number: [id number] Date of Birth/Sex: Treating RN: Feb 12, 1961  (58 y.o. 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 S80.12XD Contusion of left lower leg, subsequent encounter 09/14/2018 No Yes I87.322 Chronic venous hypertension (idiopathic) with 09/14/2018 No Yes inflammation of left lower extremity Inactive Problems Resolved Problems Electronic Signature(s) Signed: 12/28/2018 5:45:12 PM By: Russell Ham MD Entered By: Russell Collier on 12/28/2018 16:54:29 -------------------------------------------------------------------------------- Progress Note Details Patient Name: Date of Service: MARINUS, BAUERLEIN 12/28/2018 2:45 PM Medical Record [id number] Patient Account Number: [id number] Date of Birth/Sex: Treating RN: 10-08-1960 (58 y.o. Russell Collier Primary Care Provider: Meryle Collier Other Clinician: Referring Provider: Treating Provider/Extender:Erina Hamme, [facility], Russell Collier Weeks in Treatment: 15 Subjective History of Present Illness (HPI) ADMISSION 09/14/2018 This is a 58 year old man who traumatized his left anterior tibia area on the bathtub. He describes this initially as a bruise that subsequently reopened. He has been seen in the ER several times and has had at least 3 courses of antibiotics including doxy and Keflex twice. He has been using Neosporin and peroxide on this. Past medical history includes obstructive sleep apnea, cellulitis, hypertension, coronary artery disease, peripheral neuropathy [nondiabetic], currently on a heart murmur for atypical chest pain and bradycardia. 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 ARTERIAL STUDIES done in March 2020 showed an ABI on the left of 0.93, TBI on  the left slightly reduced at 0.42. On the right his ABI was 0.95 with a TBI of 0.51 he has monophasic waveforms in the lower extremities bilaterally. Interpreted on the left this showing mild left lower extremity arterial disease as well as on the right. 09/25/18-Patient returns at 1 week to the clinic, noted that arterial studies done in March 2020. We have been using Iodoflex to the wound on the left leg, patient has significant degree of pain and declines to have any debridement  8/21; the patient has not been here in 3 weeks. Since he was last here he was admitted to hospital on 8/4. He was felt to have stable angina. He has since been to his cardiologist. I note that he has an EF of about 48%. He has a venous wound on the left anterior mid tibia area probably with lymphedema. We have been using Iodoflex. He does not tolerate debridement well. He tells me that his cardiologist has been adjusting his Lasix and then the wraps fall down he tries to pull them up. He is describing pain making it difficult for him to function. 9/3; patient is was hospitalized since he was last here with COPD exacerbation. Came in with Aquacel Ag on the wound. The wound actually looks improved. 9/17; patient was apparently in the ER again with breathing issues and blood pressure issues. He was not admitted. We are using Sorbact of the wound with nice improvement dimensions are better 10/1; the patient's wound is smaller and appears to have a reasonable healthy surface. We are using Sorbact 10/8; the wound is smaller although there is still an area with depth and necrotic debris at the base. We are using Sorbact 10/15; not much change in the wound area. We have been using Sorbact. 10/22; small wound area using endoform since last week 11/5; Russell Collier arrived today in a healed state. Left leg and wrapped. He still does not have compression stockings Objective Constitutional Patient is hypertensive.. Pulse regular and  within target range for patient.Russell Kitchen Respirations regular, non-labored and within target range.. Temperature is normal and within the target range for the patient.Russell Kitchen Appears in no distress. Vitals Time Taken: 3:18 PM, Height: 71 in, Weight: 320 lbs, BMI: 44.6, Temperature: 98.2 F, Pulse: 72 bpm, Respiratory Rate: 18 breaths/min, Blood Pressure: 155/81 mmHg. Cardiovascular Pedal pulses palpable and strong bilaterally.. We have good edema control in the left leg however his right leg which has not been wrapped has significant lymphedema.Russell Kitchen Psychiatric appears at normal baseline. General Notes: Wound exam; left anterior shin is healed. He has good epithelialization Integumentary (Hair, Skin) Wound #1 status is Open. Original cause of wound was Trauma. The wound is located on the Left,Medial Lower Leg. The wound measures 0cm length x 0cm width x 0cm depth; 0cm^2 area and 0cm^3 volume. There is no tunneling or undermining noted. There is a none present amount of drainage noted. The wound margin is distinct with the outline attached to the wound base. There is no granulation within the wound bed. There is no necrotic tissue within the wound bed. 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 apply in AM and remove at night Other: - Order elastic therapy stockings 1 the  patient can be discharged from the wound care Collier 2. He did not bring in stockings. I have told him that if his left leg becomes as large as his right without compression that his leg was likely  to reopen Electronic Signature(s) Signed: 12/28/2018 5:45:12 PM By: Russell Ham MD Entered By: Russell Collier on 12/28/2018 17:02:34 -------------------------------------------------------------------------------- SuperBill Details Patient Name: Date of Service: HULAN, NUTTALL 12/28/2018 Medical Record [id number] Patient Account Number: [id number] Date of Birth/Sex: Treating RN: 01-07-1961 (58 y.o. 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

## 2018-12-29 ENCOUNTER — Telehealth (HOSPITAL_COMMUNITY): Payer: Self-pay

## 2018-12-29 NOTE — Telephone Encounter (Signed)
Faxed signed Documents to [facility] @ [phone].

## 2019-01-01 NOTE — Progress Notes (Addendum)
Russell Collier, Russell Collier ([id number]) Visit Report for 12/28/2018 Arrival Information Details Patient Name: Date of Service: Russell Collier, Russell Collier 12/28/2018 2:45 PM Medical Record [id number] Patient Account Number: [id number] Date of Birth/Sex: Treating RN: [birth year] (58 y.o. Russell Collier Primary Care Russell Collier: [facility], Russell Collier Other Clinician: Referring Diego Delancey: Treating Marybeth Dandy/Extender:Robson, Peggyann Juba, Jairo Ben in Treatment: 15 Visit Information History Since Last Visit Added or deleted any medications: No Patient Arrived: Russell Collier Any new allergies or adverse reactions: No Arrival Time: 15:15 Had a fall or experienced change in No Accompanied By: alone activities of daily living that may affect Transfer Assistance: None risk of falls: Patient Identification Verified: Yes Signs or symptoms of abuse/neglect since last No Secondary Verification Process Completed: Yes visito Patient Requires Transmission-Based No Hospitalized since last visit: No Precautions: Implantable device outside of the clinic excluding No Patient Has Alerts: Yes cellular tissue based products placed in the center Patient Alerts: L ABI = since last visit: 0.93 Has Dressing in Place as Prescribed: Yes R ABI = Has Compression in Place as Prescribed: Yes 0.95 Pain Present Now: No Electronic Signature(s) Signed: 01/01/2019 5:46:53 PM By: Levan Hurst RN, BSN Entered By: Levan Hurst on 12/28/2018 15:15:27 -------------------------------------------------------------------------------- Clinic Level of Care Assessment Details Patient Name: Date of Service: Russell Collier 12/28/2018 2:45 PM Medical Record [id number] Patient Account Number: [id number] Date of Birth/Sex: Treating RN: 06/30/1960 (58 y.o. Russell Collier Primary Care Burnette Valenti: [place], Russell Collier Other Clinician: Referring Lasonya Hubner: Treating Zahrah Sutherlin/Extender:Robson, Peggyann Juba, Jairo Ben in Treatment: 15 Clinic  Level of Care Assessment Items TOOL 4 Quantity Score X - Use when only an EandM is performed on FOLLOW-UP visit 1 0 ASSESSMENTS - Nursing Assessment / Reassessment X - Reassessment of Co-morbidities (includes updates in patient status) 1 10 X - Reassessment of Adherence to Treatment Plan 1 5 ASSESSMENTS - Wound and Skin Assessment / Reassessment X - Simple Wound Assessment / Reassessment - one wound 1 5 []  - Complex Wound Assessment / Reassessment - multiple wounds 0 []  - Dermatologic / Skin Assessment (not related to wound area) 0 ASSESSMENTS - Focused Assessment X - Circumferential Edema Measurements - multi extremities 1 5 []  - Nutritional Assessment / Counseling / Intervention 0 []  - Lower Extremity Assessment (monofilament, tuning fork, pulses) 0 []  - Peripheral Arterial Disease Assessment (using hand held doppler) 0 ASSESSMENTS - Ostomy and/or Continence Assessment and Care []  - Incontinence Assessment and Management 0 []  - Ostomy Care Assessment and Management (repouching, etc.) 0 PROCESS - Coordination of Care X - Simple Patient / Family Education for ongoing care 1 15 []  - Complex (extensive) Patient / Family Education for ongoing care 0 X - Staff obtains Programmer, systems, Records, Test Results / Process Orders 1 10 []  - Staff telephones HHA, Nursing Homes / Clarify orders / etc 0 []  - Routine Transfer to another Facility (non-emergent condition) 0 []  - Routine Hospital Admission (non-emergent condition) 0 []  - New Admissions / Biomedical engineer / Ordering NPWT, Apligraf, etc. 0 []  - Emergency Hospital Admission (emergent condition) 0 X - Simple Discharge Coordination 1 10 []  - Complex (extensive) Discharge Coordination 0 PROCESS - Special Needs []  - Pediatric / Minor Patient Management 0 []  - Isolation Patient Management 0 []  - Hearing / Language / Visual special needs 0 []  - Assessment of Community assistance (transportation, D/C planning, etc.) 0 []  - Additional  assistance / Altered mentation 0 []  - Support Surface(s) Assessment (bed, cushion, seat, etc.) 0 INTERVENTIONS - Wound Cleansing / Measurement X - Simple Wound Cleansing - one wound  1 5 []  - Complex Wound Cleansing - multiple wounds 0 X - Wound Imaging (photographs - any number of wounds) 1 5 []  - Wound Tracing (instead of photographs) 0 X - Simple Wound Measurement - one wound 1 5 []  - Complex Wound Measurement - multiple wounds 0 INTERVENTIONS - Wound Dressings []  - Small Wound Dressing one or multiple wounds 0 []  - Medium Wound Dressing one or multiple wounds 0 []  - Large Wound Dressing one or multiple wounds 0 []  - Application of Medications - topical 0 []  - Application of Medications - injection 0 INTERVENTIONS - Miscellaneous []  - External ear exam 0 []  - Specimen Collection (cultures, biopsies, blood, body fluids, etc.) 0 []  - Specimen(s) / Culture(s) sent or taken to Lab for analysis 0 []  - Patient Transfer (multiple staff / Civil Service fast streamer / Similar devices) 0 []  - Simple Staple / Suture removal (25 or less) 0 []  - Complex Staple / Suture removal (26 or more) 0 []  - Hypo / Hyperglycemic Management (close monitor of Blood Glucose) 0 []  - Ankle / Brachial Index (ABI) - do not check if billed separately 0 X - Vital Signs 1 5 Has the patient been seen at the hospital within the last three years: Yes Total Score: 80 Level Of Care: New/Established - Level 3 Electronic Signature(s) Signed: 12/28/2018 5:16:49 PM By: Kela Millin Entered By: Kela Millin on 12/28/2018 16:19:21 -------------------------------------------------------------------------------- Lower Extremity Assessment Details Patient Name: Date of Service: Russell Collier, Russell Collier 12/28/2018 2:45 PM Medical Record [id number] Patient Account Number: [id number] Date of Birth/Sex: Treating RN: September 11, 1960 (58 y.o. Russell Collier Primary Care Trease B[place]ner: Meryle Ready Other Clinician: Referring Naziya Hegwood: Treating  Martavis Gurney/Extender:Robson, Peggyann Juba, Russell Collier Weeks in Treatment: 15 Edema Assessment Assessed: [Left: No] [Right: No] Edema: [Left: Ye] [Right: s] Calf Left: Right: Point of Measurement: cm From Medial Instep 44.3 cm cm Ankle Left: Right: Point of Measurement: cm From Medial Instep 22.8 cm cm Vascular Assessment Pulses: Dorsalis Pedis Palpable: [Left:Yes] Electronic Signature(s) Signed: 01/01/2019 5:46:53 PM By: Levan Hurst RN, BSN Entered By: Levan Hurst on 12/28/2018 15:22:29 -------------------------------------------------------------------------------- Multi Wound Chart Details Patient Name: Date of Service: Russell Collier, Russell Collier 12/28/2018 2:45 PM Medical Record [id number] Patient Account Number: [id number] Date of Birth/Sex: Treating RN: 27-Jun-1960 (58 y.o. 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, Asthma, Sleep N/A Apnea, Angina, Congestive Heart Failure, Coronary Artery Disease, Hypertension, Osteoarthritis, Neuropathy] Date Acquired: [1:07/13/2018] [N/A:N/A] Weeks of Treatment: [1:15] [N/A:N/A] Wound Status: [1:Open] [N/A:N/A] Measurements L x W x D 0x0x0 [N/A:N/A] (cm) Area (cm) : [1:0] [N/A:N/A] Volume (cm) : [1:0] [N/A:N/A] % Reduction in Area: [1:100.00%] [N/A:N/A] % Reduction in Volume: 100.00% [N/A:N/A] Classification: [1:Full Thickness Without Exposed Support Structures] [N/A:N/A] Exudate Amount: [1:None Present] [N/A:N/A] Wound Margin:  [1:Distinct, outline attached N/A] Granulation Amount: [1:None Present (0%)] [N/A:N/A] Necrotic Amount: [1:None Present (0%)] [N/A:N/A] Exposed Structures: [1:Fascia: No Fat Layer (Subcutaneous Tissue) Exposed: No Tendon: No Muscle: No Joint: No Bone: No Large (67-100%)] [N/A:N/A N/A] Treatment Notes Electronic Signature(s) Signed: 12/28/2018 5:30:28 PM By: Deon Pilling Signed: 12/28/2018 5:45:12 PM By: Linton Ham MD Entered By: Linton Ham on 12/28/2018 16:54:36 -------------------------------------------------------------------------------- Multi-Disciplinary Care Plan Details Patient Name: Date of Service:  Russell Collier, Russell Collier 12/28/2018 2:45 PM Medical Record [id number] Patient Account Number: [id number] Date of Birth/Sex: Treating RN: [birth year] (58 y.o. Russell Collier Primary Care Azia Toutant: [place], Russell Collier Other Clinician: Referring Anterrio Mccleery: Treating Betsabe Iglesia/Extender:Robson, [facility], Russell Collier Weeks in Treatment: 15 Active Inactive Electronic Signature(s) Signed: 12/28/2018 5:16:49 PM By: Kela Millin Entered By: Kela Millin on 12/28/2018 16:05:49 -------------------------------------------------------------------------------- Pain Assessment Details Patient Name: Date of Service: Russell Collier, Russell Collier 12/28/2018 2:45 PM Medical Record [id number] Patient Account Number: [id number] Date of Birth/Sex: Treating RN: February 15, 1961 (58 y.o. Russell Collier Primary Care Tildon Silveria: Meryle Ready Other Clinician: Referring Odell Fasching: Treating Rody Keadle/Extender:Robson, Peggyann Juba, Russell Collier Weeks in Treatment: 15 Active Problems Location of Pain Severity and Description of Pain Patient Has Paino No Site Locations Pain Management and Medication Current Pain Management: Electronic Signature(s) Signed: 01/01/2019 5:46:53 PM By: Levan Hurst RN, BSN Entered By: Levan Hurst on 12/28/2018  15:15:46 -------------------------------------------------------------------------------- Patient/Caregiver Education Details Patient Name: Date of Service: Russell Collier, Russell Collier 11/5/2020andnbsp2:45 PM Medical Record [id number] Patient Account Number: [id number] Date of Birth/Gender: 07/19/1960 (58 y.o. M) Treating RN: Kela Millin Primary Care Physician: Meryle Ready Other Clinician: Referring Physician: Treating Physician/Extender:Robson, Peggyann Juba, Jairo Ben in Treatment: 15 Education Assessment Education Provided To: Patient Education Topics Provided Wound/Skin Impairment: Handouts: Skin Care Do's and Dont's Methods: Explain/Verbal Responses: State content correctly Electronic Signature(s) Signed: 12/28/2018 5:16:49 PM By: Kela Millin Entered By: Kela Millin on 12/28/2018 16:06:19 -------------------------------------------------------------------------------- Wound Assessment Details Patient Name: Date of Service: Russell Collier, Russell Collier 12/28/2018 2:45 PM Medical Record [id number] Patient Account Number: [id number] Date of Birth/Sex: Treating RN: 1960/02/26 (58 y.o. Russell Collier Primary Care Kaylem Gidney: [facility], Russell Collier Other Clinician: Referring Aleta Manternach: Treating Cherylynn Liszewski/Extender:Robson, Peggyann Juba, Russell Collier Weeks in Treatment: 15 Wound Status Wound Number: 1 Primary Venous Leg Ulcer Etiology: Wound Location: Left Lower Leg - Medial Wound Healed - Epithelialized Wounding Event: Trauma Status: Date Acquired: 07/13/2018 Comorbid Hemophilia, Asthma, Sleep Apnea, Angina, Weeks Of Treatment: 15 History: Congestive Heart Failure, Coronary Artery Clustered Wound: No Disease, Hypertension, Osteoarthritis, Neuropathy Photos Wound Measurements Length: (cm) 0 % Reduct Width: (cm) 0 % Reduct Depth: (cm) 0 Epitheli Area: (cm) 0 Tunneli Volume: (cm) 0 Undermi Wound Description Classification: Full Thickness Without Exposed Support Foul  Odo Structures Slough/F Wound Distinct, outline attached Margin: Exudate None Present Amount: Wound Bed Granulation Amount: None Present (0%) Necrotic Amount: None Present (0%) Fascia E Fat Laye Tendon E Muscle E Joint Ex Bone Exp r After Cleansing: No ibrino No Exposed Structure xposed: No r (Subcutaneous Tissue) Exposed: No xposed: No xposed: No posed: No osed: No ion in Area: 100% ion in Volume: 100% alization: Large (67-100%) ng: No ning: No Electronic Signature(s) Signed: 01/04/2019 3:57:32 PM By: Mikeal Hawthorne EMT/HBOT Signed: 01/31/2019 12:08:18 PM By: Levan Hurst RN, BSN Previous Signature: 01/01/2019 5:46:53 PM Version By: Levan Hurst RN, BSN Entered By: Mikeal Hawthorne on 01/04/2019 10:37:25 -------------------------------------------------------------------------------- Vitals Details Patient Name: Date of Service: Russell Collier, Russell Collier 12/28/2018 2:45 PM Medical Record [id number] Patient Account Number: [id number] Date of Birth/Sex: Treating RN: Sep 03, 1960 (58 y.o. Russell Collier Primary Care Kendric Sindelar: [facility], Russell Collier Other Clinician: Referring Abigayl Hor: Treating Dyron Kawano/Extender:Robson, Peggyann Juba, Russell Collier Weeks in Treatment: 15 Vital Signs Time Taken: 15:18 Temperature (F): 98.2 Height (in): 71 Pulse (bpm): 72 Weight (lbs): 320 Respiratory Rate (breaths/min): 18 Body Mass Index (BMI): 44.6 Blood Pressure (mmHg): 155/81 Reference Range: 80 - 120 mg / dl Electronic Signature(s) Signed: 01/01/2019 5:46:53 PM By: Levan Hurst RN, BSN Entered By: Levan Hurst on 12/28/2018 15:18:52

## 2019-01-02 DIAGNOSIS — L600 Ingrowing nail: Secondary | ICD-10-CM | POA: Diagnosis not present

## 2019-01-02 DIAGNOSIS — B351 Tinea unguium: Secondary | ICD-10-CM | POA: Diagnosis not present

## 2019-01-04 DIAGNOSIS — L918 Other hypertrophic disorders of the skin: Secondary | ICD-10-CM | POA: Diagnosis not present

## 2019-01-04 DIAGNOSIS — L720 Epidermal cyst: Secondary | ICD-10-CM | POA: Diagnosis not present

## 2019-01-07 ENCOUNTER — Encounter: Payer: Self-pay | Admitting: Physician Assistant
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
## 2019-01-08 ENCOUNTER — Telehealth: Payer: Self-pay | Admitting: *Deleted

## 2019-01-08 ENCOUNTER — Encounter: Payer: Self-pay | Admitting: Physician Assistant

## 2019-01-08 ENCOUNTER — Telehealth (HOSPITAL_COMMUNITY): Payer: Self-pay | Admitting: Licensed Clinical Social Worker

## 2019-01-08 ENCOUNTER — Other Ambulatory Visit: Payer: Self-pay

## 2019-01-08 ENCOUNTER — Telehealth (INDEPENDENT_AMBULATORY_CARE_PROVIDER_SITE_OTHER): Payer: Medicaid Other | Admitting: Physician Assistant

## 2019-01-08 VITALS — Ht 71.0 in

## 2019-01-08 DIAGNOSIS — E854 Organ-limited amyloidosis: Secondary | ICD-10-CM

## 2019-01-08 DIAGNOSIS — I43 Cardiomyopathy in diseases classified elsewhere: Secondary | ICD-10-CM

## 2019-01-08 DIAGNOSIS — I5032 Chronic diastolic (congestive) heart failure: Secondary | ICD-10-CM

## 2019-01-08 DIAGNOSIS — Z87898 Personal history of other specified conditions: Secondary | ICD-10-CM

## 2019-01-08 DIAGNOSIS — I2510 Atherosclerotic heart disease of native coronary artery without angina pectoris: Secondary | ICD-10-CM

## 2019-01-08 DIAGNOSIS — I10 Essential (primary) hypertension: Secondary | ICD-10-CM

## 2019-01-08 MED ORDER — FUROSEMIDE 40 MG PO TABS: 40.0000 mg | ORAL_TABLET | Freq: Two times a day (BID) | ORAL | 3 refills | Status: DC

## 2019-01-08 MED ORDER — ATORVASTATIN CALCIUM 80 MG PO TABS: 80.0000 mg | ORAL_TABLET | Freq: Every day | ORAL | 3 refills | Status: DC

## 2019-01-08 MED ORDER — SPIRONOLACTONE 25 MG PO TABS: 12.5000 mg | ORAL_TABLET | Freq: Every day | ORAL | 3 refills | Status: DC

## 2019-01-08 MED FILL — VYNDAMAX 61 MG CAPS: 61 | 30 days supply | Qty: 30 | Fill #1
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
## 2019-01-08 NOTE — Telephone Encounter (Signed)
CSW consulted for possible transportation concerns. Patient had expressed concerns with getting to sleep study but states that he was just contacted by his cardiology office who states that this will be a sleep study and he just needs to pick up the equipment- pt states he can organize a ride for this.  Pt also confirms he is usually able to find transport to appts through Medicaid transportation services so does not forsee future transport concerns- will reach out if this changes.  CSW will continue to follow and assist as needed  Jorge Ny, [name] Desk#: [phone] Cell#: [phone]

## 2019-01-08 NOTE — Telephone Encounter (Signed)
-----   Message from Thompson Grayer, RN sent at 01/08/2019 11:35 AM EST ----- Regarding: sleep study Please see message below from [facility], [place] Thanks  Please reach out to Gershon Cull to call him regarding plans for his sleep study - has questions about Covid test

## 2019-01-08 NOTE — Telephone Encounter (Signed)
Reached out to patient and explained a covid test is not required for his home sleep test. I explained he would meet with the technician at his appointment time to discuss how to apply the equipment and how to use it. I gave him my direct number as well to call back with questions if needed. Pt is aware and agreeable to treatment.

## 2019-01-08 NOTE — Patient Instructions (Addendum)
Medication Instructions:  Your physician recommends that you continue on your current medications as directed. Please refer to the Current Medication list given to you today.  *If you need a refill on your cardiac medications before your next appointment, please call your pharmacy*  Lab Work: none If you have labs (blood work) drawn today and your tests are completely normal, you will receive your results only by: Marland Kitchen MyChart Message (if you have MyChart) OR . A paper copy in the mail If you have any lab test that is abnormal or we need to change your treatment, we will call you to review the results.  Testing/Procedures: none  Follow-Up: As planned in CHF clinic on December 7,2020 At [facility], you and your health needs are our priority.  As part of our continuing mission to provide you with exceptional heart care, we have created designated Provider Care Teams.  These Care Teams include your primary Cardiologist (physician) and Advanced Practice Providers (APPs -  Physician Assistants and Nurse Practitioners) who all work together to provide you with the care you need, when you need it.  Your next appointment:   6 months  The format for your next appointment:   In Person  Provider:   You may see Fransico Him, MD or one of the following Advanced Practice Providers on your designated Care Team:    Melina Copa, PA-C  Ermalinda Barrios, PA-C   Other Instructions We will contact Home Health to come out to your home We will also contact Social Worker to help with transportation.

## 2019-01-09 ENCOUNTER — Encounter (HOSPITAL_COMMUNITY): Admission: RE | Payer: Self-pay | Source: Home / Self Care

## 2019-01-09 ENCOUNTER — Ambulatory Visit (HOSPITAL_COMMUNITY): Admission: RE | Admit: 2019-01-09 | Payer: Medicaid Other | Source: Home / Self Care | Admitting: Gastroenterology

## 2019-01-09 SURGERY — ESOPHAGOGASTRODUODENOSCOPY (EGD) WITH PROPOFOL
Anesthesia: Monitor Anesthesia Care

## 2019-01-12 ENCOUNTER — Telehealth: Payer: Self-pay | Admitting: Physician Assistant

## 2019-01-12 ENCOUNTER — Encounter (HOSPITAL_COMMUNITY): Payer: Self-pay

## 2019-01-12 ENCOUNTER — Emergency Department (HOSPITAL_COMMUNITY): Payer: Medicaid Other

## 2019-01-12 ENCOUNTER — Other Ambulatory Visit: Payer: Self-pay

## 2019-01-12 ENCOUNTER — Emergency Department (HOSPITAL_COMMUNITY)
Admission: EM | Admit: 2019-01-12 | Discharge: 2019-01-12 | Disposition: A | Discharge status: Home / Self Care | Payer: Medicaid Other | Attending: Emergency Medicine | Admitting: Emergency Medicine

## 2019-01-12 DIAGNOSIS — I959 Hypotension, unspecified: Secondary | ICD-10-CM | POA: Insufficient documentation

## 2019-01-12 DIAGNOSIS — I2510 Atherosclerotic heart disease of native coronary artery without angina pectoris: Secondary | ICD-10-CM | POA: Insufficient documentation

## 2019-01-12 DIAGNOSIS — I110 Hypertensive heart disease with heart failure: Secondary | ICD-10-CM | POA: Insufficient documentation

## 2019-01-12 DIAGNOSIS — Z79899 Other long term (current) drug therapy: Secondary | ICD-10-CM | POA: Diagnosis not present

## 2019-01-12 DIAGNOSIS — Z7982 Long term (current) use of aspirin: Secondary | ICD-10-CM | POA: Insufficient documentation

## 2019-01-12 DIAGNOSIS — I499 Cardiac arrhythmia, unspecified: Secondary | ICD-10-CM | POA: Diagnosis not present

## 2019-01-12 DIAGNOSIS — I5032 Chronic diastolic (congestive) heart failure: Secondary | ICD-10-CM | POA: Diagnosis not present

## 2019-01-12 DIAGNOSIS — R9431 Abnormal electrocardiogram [ECG] [EKG]: Secondary | ICD-10-CM | POA: Diagnosis not present

## 2019-01-12 DIAGNOSIS — R079 Chest pain, unspecified: Secondary | ICD-10-CM | POA: Diagnosis not present

## 2019-01-12 DIAGNOSIS — Z20828 Contact with and (suspected) exposure to other viral communicable diseases: Secondary | ICD-10-CM | POA: Insufficient documentation

## 2019-01-12 DIAGNOSIS — F17210 Nicotine dependence, cigarettes, uncomplicated: Secondary | ICD-10-CM | POA: Diagnosis not present

## 2019-01-12 DIAGNOSIS — R197 Diarrhea, unspecified: Secondary | ICD-10-CM | POA: Diagnosis not present

## 2019-01-12 DIAGNOSIS — I213 ST elevation (STEMI) myocardial infarction of unspecified site: Secondary | ICD-10-CM | POA: Diagnosis not present

## 2019-01-12 DIAGNOSIS — E860 Dehydration: Secondary | ICD-10-CM

## 2019-01-12 DIAGNOSIS — R109 Unspecified abdominal pain: Secondary | ICD-10-CM | POA: Insufficient documentation

## 2019-01-12 DIAGNOSIS — R0789 Other chest pain: Secondary | ICD-10-CM | POA: Diagnosis not present

## 2019-01-12 LAB — CBC WITH DIFFERENTIAL/PLATELET
Abs Immature Granulocytes: 0.02 10*3/uL (ref 0.00–0.07)
Basophils Absolute: 0 10*3/uL (ref 0.0–0.1)
Basophils Relative: 0 %
Eosinophils Absolute: 0.1 10*3/uL (ref 0.0–0.5)
Eosinophils Relative: 2 %
HCT: 36.9 % — ABNORMAL LOW (ref 39.0–52.0)
Hemoglobin: 12.1 g/dL — ABNORMAL LOW (ref 13.0–17.0)
Immature Granulocytes: 0 %
Lymphocytes Relative: 25 %
Lymphs Abs: 1.5 10*3/uL (ref 0.7–4.0)
MCH: 29 pg (ref 26.0–34.0)
MCHC: 32.8 g/dL (ref 30.0–36.0)
MCV: 88.5 fL (ref 80.0–100.0)
Monocytes Absolute: 1 10*3/uL (ref 0.1–1.0)
Monocytes Relative: 17 %
Neutro Abs: 3.4 10*3/uL (ref 1.7–7.7)
Neutrophils Relative %: 56 %
Platelets: 240 10*3/uL (ref 150–400)
RBC: 4.17 MIL/uL — ABNORMAL LOW (ref 4.22–5.81)
RDW: 15.7 % — ABNORMAL HIGH (ref 11.5–15.5)
WBC: 6.1 10*3/uL (ref 4.0–10.5)
nRBC: 0 % (ref 0.0–0.2)

## 2019-01-12 LAB — LIPASE, BLOOD: Lipase: 18 U/L (ref 11–51)

## 2019-01-12 LAB — COMPREHENSIVE METABOLIC PANEL
ALT: 41 U/L (ref 0–44)
AST: 25 U/L (ref 15–41)
Albumin: 3.5 g/dL (ref 3.5–5.0)
Alkaline Phosphatase: 85 U/L (ref 38–126)
Anion gap: 10 (ref 5–15)
BUN: 19 mg/dL (ref 6–20)
CO2: 21 mmol/L — ABNORMAL LOW (ref 22–32)
Calcium: 8.7 mg/dL — ABNORMAL LOW (ref 8.9–10.3)
Chloride: 105 mmol/L (ref 98–111)
Creatinine, Ser: 1.61 mg/dL — ABNORMAL HIGH (ref 0.61–1.24)
GFR calc Af Amer: 53 mL/min — ABNORMAL LOW (ref >60)
GFR calc non Af Amer: 46 mL/min — ABNORMAL LOW (ref >60)
Glucose, Bld: 101 mg/dL — ABNORMAL HIGH (ref 70–99)
Potassium: 4.1 mmol/L (ref 3.5–5.1)
Sodium: 136 mmol/L (ref 135–145)
Total Bilirubin: 1.1 mg/dL (ref 0.3–1.2)
Total Protein: 6.7 g/dL (ref 6.5–8.1)

## 2019-01-12 LAB — C DIFFICILE QUICK SCREEN W PCR REFLEX
C Diff antigen: NEGATIVE
C Diff interpretation: NOT DETECTED
C Diff toxin: NEGATIVE

## 2019-01-12 LAB — POC SARS CORONAVIRUS 2 AG -  ED: SARS Coronavirus 2 Ag: NEGATIVE

## 2019-01-12 LAB — TROPONIN I (HIGH SENSITIVITY)
Troponin I (High Sensitivity): 18 ng/L — ABNORMAL HIGH (ref <18)
Troponin I (High Sensitivity): 17 ng/L (ref <18)

## 2019-01-12 MED ORDER — SODIUM CHLORIDE 0.9 % IV BOLUS
500.0000 mL | Freq: Once | INTRAVENOUS | Status: AC
  Administered 2019-01-12: 1000 mL via INTRAVENOUS

## 2019-01-12 MED ORDER — IOHEXOL 300 MG/ML  SOLN
100.0000 mL | Freq: Once | INTRAMUSCULAR | Status: AC | PRN
  Administered 2019-01-12: 100 mL via INTRAVENOUS

## 2019-01-12 MED ORDER — SODIUM CHLORIDE 0.9 % IV BOLUS
500.0000 mL | Freq: Once | INTRAVENOUS | Status: AC
  Administered 2019-01-12: 500 mL via INTRAVENOUS

## 2019-01-12 NOTE — ED Notes (Signed)
Pt verbalized understanding of discharge instructions and denies any further questions at this time.   

## 2019-01-12 NOTE — Discharge Instructions (Addendum)
Stay hydrated   Your COVID is negative today   See your GI doctor   Repeat kidney function test in a week with your doctor   You have a densities on your bones that may be incidental. See a hematologist for further evaluation   Return to ER if you have worse chest pain, abdominal pain, vomiting.

## 2019-01-12 NOTE — Progress Notes (Signed)
[name]      DOB: [birth year]  Purpose of Visit: VS, exam, Med Rec., wt. Ordering Provider: Sharrell Ku, PA  Medications: Is the patient taking all medications listed on MAR from Epic? Yes  List any medications that are not being taken correctly: n/a  List any medication refills needed: none  Is the patient able to pick up medications? No but can get a ride.         Physical Exam:  Lung sounds: clear  Heart sounds: regular  Peripheral edema: no  Wounds: yes Location: LLE, now healed d/t visits to wound center.  Arrived at Mr. [place] on behalf of [facility] for the above mentioned reasons. He c/o diarrhea x 4 days 6-7x/day, shob w/exertion, lower abd pain and L chest pain "similar to what I felt before I had my stents placed, but not quite as bad" Med Rec reviewed and to be faxed to referring provider by [facility].   Called [facility] and spoke to Dr. Harrington Challenger about the findings.  Was encouraged to call EMS to have Mr. Heick brought to the ED for evaluation and IV hydration, labs, ekg, etc.  Pt agreeable.      Vanita Ingles, RN 01/12/19
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
## 2019-01-12 NOTE — Telephone Encounter (Signed)
New message   [place] Nurse for [facility] reporting   Pt c/o of Chest Pain: STAT if CP now or developed within 24 hours  1. Are you having CP right now? yes  2. Are you experiencing any other symptoms (ex. SOB, nausea, vomiting, sweating)? Elevated HR 106-113, DIARRHEA   3. How long have you been experiencing CP?   4. Is your CP continuous or coming and going?   5. Have you taken Nitroglycerin?  ?

## 2019-01-12 NOTE — ED Triage Notes (Addendum)
Pt arrived via [facility] from home with c/o left sided chest pain and diarrhea x4 days. Home health nurse called EMS and reported pt had left cardiac stent placed on 12/20/18. EMS reports pts chest pain was 7/10 upon arrival to pts home. Chest pain decreased to 3/10 with admin of 1x nitro and 324 mg aspirin. Pt pain increased to 7/10 after arrival to ED. Pt also c/o of cramping abdominal pain rated 8/10. Pt is currently A&O x4.

## 2019-01-12 NOTE — ED Provider Notes (Signed)
  Physical Exam  BP (!) 101/43   Pulse 72   Temp 98.1 F (36.7 C) (Oral)   Resp (!) 22   Ht 5\' 11"  (1.803 m)   Wt (!) 142.4 kg   SpO2 98%   BMI 43.79 kg/m   Physical Exam  ED Course/Procedures     Procedures  MDM  Care assumed at 4 pm. Patient here with diarrhea and abdominal cramping. Had chest pain that is unchanged. Sign out pending CT ab/pel, Trop x 2.   6:29 PM Trop was 18 and now 17. Cr is 1.6, slightly elevated. C diff negative. CT showed multiple radiodensities, unclear of the etiology. Will refer back to GI and also to hematology. Stable for discharge. Told him to stay hydrated      Drenda Freeze, MD 01/12/19 [phone]

## 2019-01-12 NOTE — Telephone Encounter (Signed)
Spoke with Jackelyn Poling, RN from [facility]. She is at pt's home.  States he is having left sided chest pain that is similar to what he felt right before he got recent stent. Also having ongoing diarrhea.  HR elevated 110s and BP is on low side 100/. Adv to call EMS and have pt transported to [facility] for evaluation.  Pt in background, states he would prefer to lay down for a while.  Dr. Harrington Challenger (DOD) came to phone and recommended pt go to ER by EMS for evaluation.  [facility] nurse calling EMS.

## 2019-01-15 ENCOUNTER — Ambulatory Visit (HOSPITAL_BASED_OUTPATIENT_CLINIC_OR_DEPARTMENT_OTHER): Payer: Medicaid Other | Attending: Cardiology | Admitting: Cardiology

## 2019-01-15 ENCOUNTER — Other Ambulatory Visit: Payer: Self-pay

## 2019-01-15 ENCOUNTER — Other Ambulatory Visit: Payer: Self-pay | Admitting: Gastroenterology

## 2019-01-15 DIAGNOSIS — G4733 Obstructive sleep apnea (adult) (pediatric): Secondary | ICD-10-CM

## 2019-01-16 ENCOUNTER — Encounter: Payer: Self-pay | Admitting: Hematology and Oncology

## 2019-01-16 ENCOUNTER — Telehealth: Payer: Self-pay | Admitting: Hematology and Oncology

## 2019-01-16 NOTE — Telephone Encounter (Signed)
A new patient appt has been scheduled for Russell Collier to see Dr. Lorenso Courier on 12/18 at 10am. Pt aware to arrive 15 minutes early. Letter mailed.

## 2019-01-25 ENCOUNTER — Telehealth (HOSPITAL_COMMUNITY): Payer: Self-pay | Admitting: *Deleted

## 2019-01-25 NOTE — Telephone Encounter (Signed)
Received fax from [facility], pt needs oral care from them and want to know if there are any limits or restrictions.  Per Dr Haroldine Laws there are none, form faxed back to them at [phone]

## 2019-01-29 ENCOUNTER — Ambulatory Visit: Payer: Medicaid Other | Admitting: Hematology & Oncology

## 2019-01-29 ENCOUNTER — Encounter (HOSPITAL_COMMUNITY): Payer: Self-pay | Admitting: Internal Medicine

## 2019-01-29 ENCOUNTER — Telehealth: Payer: Self-pay | Admitting: Physical Therapy

## 2019-01-29 ENCOUNTER — Other Ambulatory Visit: Payer: Medicaid Other

## 2019-01-29 ENCOUNTER — Other Ambulatory Visit: Payer: Self-pay

## 2019-01-29 ENCOUNTER — Ambulatory Visit (HOSPITAL_COMMUNITY)
Admission: RE | Admit: 2019-01-29 | Discharge: 2019-01-29 | Disposition: A | Discharge status: Home / Self Care | Payer: Medicaid Other | Source: Ambulatory Visit | Attending: Internal Medicine | Admitting: Internal Medicine

## 2019-01-29 ENCOUNTER — Encounter: Payer: Self-pay | Admitting: Neurology

## 2019-01-29 VITALS — BP 105/66 | HR 81 | Wt 323.0 lb

## 2019-01-29 DIAGNOSIS — I5030 Unspecified diastolic (congestive) heart failure: Secondary | ICD-10-CM

## 2019-01-29 DIAGNOSIS — Z86718 Personal history of other venous thrombosis and embolism: Secondary | ICD-10-CM | POA: Diagnosis not present

## 2019-01-29 DIAGNOSIS — Z86711 Personal history of pulmonary embolism: Secondary | ICD-10-CM | POA: Insufficient documentation

## 2019-01-29 DIAGNOSIS — Z955 Presence of coronary angioplasty implant and graft: Secondary | ICD-10-CM | POA: Diagnosis not present

## 2019-01-29 DIAGNOSIS — Z8249 Family history of ischemic heart disease and other diseases of the circulatory system: Secondary | ICD-10-CM | POA: Insufficient documentation

## 2019-01-29 DIAGNOSIS — E7800 Pure hypercholesterolemia, unspecified: Secondary | ICD-10-CM | POA: Diagnosis not present

## 2019-01-29 DIAGNOSIS — Z9119 Patient's noncompliance with other medical treatment and regimen: Secondary | ICD-10-CM | POA: Insufficient documentation

## 2019-01-29 DIAGNOSIS — I110 Hypertensive heart disease with heart failure: Secondary | ICD-10-CM | POA: Diagnosis not present

## 2019-01-29 DIAGNOSIS — Z79899 Other long term (current) drug therapy: Secondary | ICD-10-CM | POA: Diagnosis not present

## 2019-01-29 DIAGNOSIS — G4733 Obstructive sleep apnea (adult) (pediatric): Secondary | ICD-10-CM | POA: Diagnosis not present

## 2019-01-29 DIAGNOSIS — M1710 Unilateral primary osteoarthritis, unspecified knee: Secondary | ICD-10-CM | POA: Insufficient documentation

## 2019-01-29 DIAGNOSIS — F17210 Nicotine dependence, cigarettes, uncomplicated: Secondary | ICD-10-CM | POA: Diagnosis not present

## 2019-01-29 DIAGNOSIS — I10 Essential (primary) hypertension: Secondary | ICD-10-CM

## 2019-01-29 DIAGNOSIS — E854 Organ-limited amyloidosis: Secondary | ICD-10-CM

## 2019-01-29 DIAGNOSIS — I43 Cardiomyopathy in diseases classified elsewhere: Secondary | ICD-10-CM | POA: Diagnosis not present

## 2019-01-29 DIAGNOSIS — G629 Polyneuropathy, unspecified: Secondary | ICD-10-CM | POA: Diagnosis not present

## 2019-01-29 DIAGNOSIS — I2510 Atherosclerotic heart disease of native coronary artery without angina pectoris: Secondary | ICD-10-CM

## 2019-01-29 DIAGNOSIS — Z833 Family history of diabetes mellitus: Secondary | ICD-10-CM | POA: Insufficient documentation

## 2019-01-29 DIAGNOSIS — Z7982 Long term (current) use of aspirin: Secondary | ICD-10-CM | POA: Diagnosis not present

## 2019-01-29 DIAGNOSIS — I5032 Chronic diastolic (congestive) heart failure: Secondary | ICD-10-CM | POA: Insufficient documentation

## 2019-01-29 DIAGNOSIS — Z7902 Long term (current) use of antithrombotics/antiplatelets: Secondary | ICD-10-CM | POA: Insufficient documentation

## 2019-01-29 DIAGNOSIS — E785 Hyperlipidemia, unspecified: Secondary | ICD-10-CM | POA: Insufficient documentation

## 2019-01-29 DIAGNOSIS — Z6841 Body Mass Index (BMI) 40.0 and over, adult: Secondary | ICD-10-CM | POA: Diagnosis not present
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
## 2019-01-29 NOTE — Patient Instructions (Signed)
Take Lasix 40mg  twice daily.  You have been referred to Dr. Posey Pronto for Neuropathy. (they will contact you to schedule appointment)  You have been referred to physical/occupational therapy. (they will contact you to schedule appointment)  Follow up in 2 months

## 2019-01-29 NOTE — Telephone Encounter (Signed)
Called and spoke with patient per facility screening policy regarding therapy referral received due to high risk for complications due to Covid to see if wishing to attend therapy in person vs. Telehealth. We wishes to attend in person so he will be contacted to schedule visit.

## 2019-01-30 ENCOUNTER — Telehealth (HOSPITAL_COMMUNITY): Payer: Self-pay | Admitting: *Deleted

## 2019-01-30 NOTE — Progress Notes (Signed)
[name], [name] ([id number]) Visit Report for 11/30/2018 Arrival Information Details Patient Name: Date of Service: Russell Collier, Russell Collier 11/30/2018 10:30 AM Medical Record [id number] Patient Account Number: [id number] Date of Birth/Sex: Treating RN: [birth year] (58 y.o. Russell Collier) Carlene Coria Primary Care Deklan Minar: [place], Romelle Starcher Other Clinician: Referring Nilsa Macht: Treating Chizaram Latino/Extender:Robson, Peggyann Juba, Romelle Starcher Weeks in Treatment: 11 Visit Information History Since Last Visit All ordered tests and consults were completed: No Patient Arrived: Ambulatory Added or deleted any medications: No Arrival Time: 10:59 Any new allergies or adverse reactions: No Accompanied By: self Had a fall or experienced change in No Transfer Assistance: None activities of daily living that may affect Patient Identification Verified: Yes risk of falls: Secondary Verification Process Completed: Yes Signs or symptoms of abuse/neglect since last No Patient Requires Transmission-Based No visito Precautions: Hospitalized since last visit: No Patient Has Alerts: Yes Implantable device outside of the clinic excluding No Patient Alerts: L ABI = cellular tissue based products placed in the center 0.93 since last visit: R ABI = Has Dressing in Place as Prescribed: Yes 0.95 Has Compression in Place as Prescribed: Yes Pain Present Now: Yes Electronic Signature(s) Signed: 01/30/2019 2:59:38 PM By: Carlene Coria RN Entered By: Carlene Coria on 11/30/2018 11:00:18 -------------------------------------------------------------------------------- Compression Therapy Details Patient Name: Date of Service: MARVENS, HOLLARS 11/30/2018 10:30 AM Medical Record [id number] Patient Account Number: [id number] Date of Birth/Sex: Treating RN: 25-Dec-1960 (58 y.o. Russell Collier Primary Care Bridey Brookover: Meryle Ready Other Clinician: Referring Keyara Ent: Treating Zyire Eidson/Extender:Robson, [facility],  Romelle Starcher Weeks in Treatment: 11 Compression Therapy Performed for Wound Wound #1 Left,Medial Lower Leg Assessment: Performed By: Clinician Deon Pilling, RN Compression Type: Three Layer Pre Treatment ABI: 0.9 Post Procedure Diagnosis Same as Pre-procedure Electronic Signature(s) Signed: 11/30/2018 6:06:48 PM By: Deon Pilling Entered By: Deon Pilling on 11/30/2018 11:45:12 -------------------------------------------------------------------------------- Lower Extremity Assessment Details Patient Name: Date of Service: Collier, Russell 11/30/2018 10:30 AM Medical Record [id number] Patient Account Number: [id number] Date of Birth/Sex: Treating RN: Jul 11, 1960 (58 y.o. Russell Collier) Carlene Coria Primary Care Zacchary Pompei: [place], Romelle Starcher Other Clinician: Referring Charls Custer: Treating Dyrell Tuccillo/Extender:Robson, Peggyann Juba, Romelle Starcher Weeks in Treatment: 11 Edema Assessment Assessed: [Left: No] [Right: No] Edema: [Left: Ye] [Right: s] Calf Left: Right: Point of Measurement: cm From Medial Instep 43 cm cm Ankle Left: Right: Point of Measurement: cm From Medial Instep 20 cm cm Electronic Signature(s) Signed: 01/30/2019 2:59:38 PM By: Carlene Coria RN Entered By: Carlene Coria on 11/30/2018 11:01:56 -------------------------------------------------------------------------------- Multi Wound Chart Details Patient Name: Date of Service: Collier, Russell 11/30/2018 10:30 AM Medical Record [id number] Patient Account Number: [id number] Date of Birth/Sex: Treating RN: 06/25/1960 (58 y.o. Russell Collier Primary Care Harshita Bernales: [facility], Romelle Starcher Other Clinician: Referring Sharol Croghan: Treating Zian Delair/Extender:Robson, Peggyann Juba, Romelle Starcher Weeks in Treatment: 11 Vital Signs Height(in): 71 Pulse(bpm): 13 Weight(lbs): 320 Blood Pressure(mmHg): 151/76 Body Mass Index(BMI): 45 Temperature(F): 98.6 Respiratory 18 Rate(breaths/min): Photos: [1:No Photos] [N/A:N/A] Wound Location: [1:Left Lower  Leg - Medial] [N/A:N/A] Wounding Event: [1:Trauma] [N/A:N/A] Primary Etiology: [1:Venous Leg Ulcer] [N/A:N/A] Comorbid History: [1:Hemophilia, Asthma, Sleep N/A Apnea, Angina, Congestive Heart Failure, Coronary Artery Disease, Hypertension, Osteoarthritis, Neuropathy] Date Acquired: [1:07/13/2018] [N/A:N/A] Weeks of Treatment: [1:11] [N/A:N/A] Wound Status: [1:Open] [N/A:N/A] Measurements L x W x D 0.5x0.6x0.2 [N/A:N/A] (cm) Area (cm) : [1:0.236] [N/A:N/A] Volume (cm) : [1:0.047] [N/A:N/A] % Reduction in Area: [1:95.60%] [N/A:N/A] % Reduction in Volume: 91.20% [N/A:N/A] Classification: [1:Full Thickness Without Exposed Support Structures] [N/A:N/A] Exudate Amount: [1:Small] [N/A:N/A] Exudate Type: [1:Sanguinous] [N/A:N/A] Exudate Color: [1:red] [N/A:N/A] Wound Margin: [1:Distinct, outline attached N/A] Granulation Amount: [1:Medium (34-66%)] [N/A:N/A] Granulation  Quality: [1:Red, Pink] [N/A:N/A] Necrotic Amount: [1:Medium (34-66%)] [N/A:N/A] Exposed Structures: [1:Fat Layer (Subcutaneous N/A Tissue) Exposed: Yes Fascia: No Tendon: No Muscle: No Joint: No Bone: No] Epithelialization: [1:Small (1-33%)] [N/A:N/A] Debridement: [1:Debridement - Excisional N/A] Pre-procedure [1:11:40] [N/A:N/A] Verification/Time Out Taken: Pain Control: [1:Lidocaine 4% Topical Solution] [N/A:N/A] Tissue Debrided: [1:Subcutaneous, Slough] [N/A:N/A] Level: [1:Skin/Subcutaneous Tissue N/A] Debridement Area (sq cm):0.3 [N/A:N/A] Instrument: [1:Curette] [N/A:N/A] Bleeding: [1:Minimum] [N/A:N/A] Hemostasis Achieved: [1:Pressure] [N/A:N/A] Procedural Pain: [1:0] [N/A:N/A] Post Procedural Pain: [1:0] [N/A:N/A] Debridement Treatment [1:Procedure was tolerated] [N/A:N/A] Response: [1:well] Post Debridement [1:0.5x0.6x0.2] [N/A:N/A] Measurements L x W x D (cm) Post Debridement [1:0.047] [N/A:N/A] Volume: (cm) Procedures Performed: [1:Compression Therapy Debridement] [N/A:N/A] Treatment Notes Electronic  Signature(s) Signed: 11/30/2018 5:49:31 PM By: Linton Ham MD Signed: 11/30/2018 6:06:48 PM By: Deon Pilling Entered By: Linton Ham on 11/30/2018 11:48:47 -------------------------------------------------------------------------------- Multi-Disciplinary Care Plan Details Patient Name: Date of Service: SHAKAI, DOLLEY 11/30/2018 10:30 AM Medical Record [id number] Patient Account Number: [id number] Date of Birth/Sex: Treating RN: [birth year] (58 y.o. Russell Collier Primary Care Henery Betzold: [place], Romelle Starcher Other Clinician: Referring Tywanda Rice: Treating Jayce Kainz/Extender:Robson, Peggyann Juba, Romelle Starcher Weeks in Treatment: 11 Active Inactive Pain, Acute or Chronic Nursing Diagnoses: Pain, acute or chronic: actual or potential Potential alteration in comfort, pain Goals: Patient will verbalize adequate pain control and receive pain control interventions during procedures as needed Date Initiated: 09/14/2018 Target Resolution Date: 12/22/2018 Goal Status: Active Patient/caregiver will verbalize comfort level met Date Initiated: 09/14/2018 Date Inactivated: 11/09/2018 Target Resolution Date: 11/24/2018 Goal Status: Met Interventions: Provide education on pain management Reposition patient for comfort Treatment Activities: Administer pain control measures as ordered : 09/14/2018 Notes: Wound/Skin Impairment Nursing Diagnoses: Knowledge deficit related to ulceration/compromised skin integrity Goals: Patient/caregiver will verbalize understanding of skin care regimen Date Initiated: 09/14/2018 Date Inactivated: 10/26/2018 Target Resolution Date: 11/03/2018 Goal Status: Met Ulcer/skin breakdown will heal within 14 weeks Date Initiated: 09/14/2018 Target Resolution Date: 01/19/2019 Goal Status: Active Interventions: Assess patient/caregiver ability to obtain necessary supplies Assess patient/caregiver ability to perform ulcer/skin care regimen upon admission and as  needed Provide education on smoking Provide education on ulcer and skin care Treatment Activities: Skin care regimen initiated : 09/14/2018 Topical wound management initiated : 09/14/2018 Notes: Electronic Signature(s) Signed: 11/30/2018 6:06:48 PM By: Deon Pilling Entered By: Deon Pilling on 11/30/2018 11:09:39 -------------------------------------------------------------------------------- Pain Assessment Details Patient Name: Date of Service: AULDEN, CALISE 11/30/2018 10:30 AM Medical Record [id number] Patient Account Number: [id number] Date of Birth/Sex: Treating RN: Feb 24, 1960 (58 y.o. Oval Linsey Primary Care Bralon Antkowiak: [place], Romelle Starcher Other Clinician: Referring Jalisia Puchalski: Treating Ramsey Guadamuz/Extender:Robson, Peggyann Juba, Romelle Starcher Weeks in Treatment: 11 Active Problems Location of Pain Severity and Description of Pain Patient Has Paino Yes Site Locations Pain Location: Pain in Ulcers With Dressing Change: Yes Duration of the Pain. Constant / Intermittento Intermittent How Long Does it Lasto Hours: Minutes: 15 Rate the pain. Current Pain Level: 0 Worst Pain Level: 7 Least Pain Level: 0 Tolerable Pain Level: 5 Character of Pain Describe the Pain: Aching Pain Management and Medication Current Pain Management: Medication: Yes Cold Application: No Rest: Yes Massage: No Activity: No T.E.N.S.: No Heat Application: No Leg drop or elevation: No Is the Current Pain Management Adequate: Inadequate How does your wound impact your activities of daily livingo Sleep: No Bathing: No Appetite: No Relationship With Others: No Bladder Continence: No Emotions: No Bowel Continence: No Work: No Toileting: No Drive: No Dressing: No Hobbies: No Electronic Signature(s) Signed: 01/30/2019 2:59:38 PM By: Carlene Coria RN Entered By: Carlene Coria on 11/30/2018 11:01:16 --------------------------------------------------------------------------------  Patient/Caregiver  Education Details Patient Name: Date of Service: MENDY, LAPINSKY 10/8/2020andnbsp10:30 AM Medical Record [id number] Patient Account Number: [id number] Date of Birth/Gender: 1960/10/15 (58 y.o. M) Treating RN: Deon Pilling Primary Care Physician: [facility], Romelle Starcher Other Clinician: Referring Physician: Treating Physician/Extender:Robson, Peggyann Juba, Jairo Ben in Treatment: 11 Education Assessment Education Provided To: Patient Education Topics Provided Pain: Handouts: A Guide to Pain Control Methods: Explain/Verbal Responses: Reinforcements needed Electronic Signature(s) Signed: 11/30/2018 6:06:48 PM By: Deon Pilling Entered By: Deon Pilling on 11/30/2018 11:09:51 -------------------------------------------------------------------------------- Wound Assessment Details Patient Name: Date of Service: GORMAN, SAFI 11/30/2018 10:30 AM Medical Record [id number] Patient Account Number: [id number] Date of Birth/Sex: Treating RN: 06-04-1960 (58 y.o. Russell Collier) Carlene Coria Primary Care Melba Araki: [place], Romelle Starcher Other Clinician: Referring Carrolyn Hilmes: Treating Jara Feider/Extender:Robson, Peggyann Juba, Romelle Starcher Weeks in Treatment: 11 Wound Status Wound Number: 1 Primary Venous Leg Ulcer Etiology: Wound Location: Left Lower Leg - Medial Wound Open Wounding Event: Trauma Status: Date Acquired: 07/13/2018 Comorbid Hemophilia, Asthma, Sleep Apnea, Angina, Weeks Of Treatment: 11 History: Congestive Heart Failure, Coronary Artery Clustered Wound: No Disease, Hypertension, Osteoarthritis, Neuropathy Photos Wound Measurements Length: (cm) 0.5 % Reduct Width: (cm) 0.6 % Reduct Depth: (cm) 0.2 Epitheli Area: (cm) 0.236 Tunneli Volume: (cm) 0.047 Undermi Wound Description Full Thickness Without Exposed Support Foul Od Classification: Classification: Structures Slough/F Wound Distinct, outline attached Margin: Exudate Small Amount: Exudate  Sanguinous Type: Exudate red Color: Wound Bed Granulation Amount: Medium (34-66%) Granulation Quality: Red, Pink Fascia E Necrotic Amount: Medium (34-66%) Fat Laye Necrotic Quality: Adherent Slough Tendon E Muscle E Joint Ex Bone Exp or After Cleansing: No ibrino Yes Exposed Structure xposed: No r (Subcutaneous Tissue) Exposed: Yes xposed: No xposed: No posed: No osed: No ion in Area: 95.6% ion in Volume: 91.2% alization: Small (1-33%) ng: No ning: No Electronic Signature(s) Signed: 12/04/2018 3:43:33 PM By: Mikeal Hawthorne EMT/HBOT Signed: 01/30/2019 2:59:38 PM By: Carlene Coria RN Entered By: Mikeal Hawthorne on 12/01/2018 08:19:11 -------------------------------------------------------------------------------- Vitals Details Patient Name: Date of Service: FR[place], NOYCE 11/30/2018 10:30 AM Medical Record [id number] Patient Account Number: [id number] Date of Birth/Sex: Treating RN: Mar 28, 1960 (58 y.o. Oval Linsey Primary Care Maela Takeda: [place], Romelle Starcher Other Clinician: Referring Fabion Gatson: Treating Matther Labell/Extender:Robson, Peggyann Juba, BETHANY Weeks in Treatment: 11 Vital Signs Time Taken: 11:00 Temperature (F): 98.6 Height (in): 71 Pulse (bpm): 79 Weight (lbs): 320 Respiratory Rate (breaths/min): 18 Body Mass Index (BMI): 44.6 Blood Pressure (mmHg): 151/76 Reference Range: 80 - 120 mg / dl Electronic Signature(s) Signed: 01/30/2019 2:59:38 PM By: Carlene Coria RN Entered By: Carlene Coria on 11/30/2018 11:00:39

## 2019-01-30 NOTE — Progress Notes (Signed)
Russell Collier, Russell Collier ([id number]) Visit Report for 12/14/2018 Arrival Information Details Patient Name: Date of Service: Russell Collier, Russell Collier 12/14/2018 3:30 PM Medical Record [id number] Patient Account Number: [id number] Date of Birth/Sex: Treating RN: 1960-12-16 (58 y.o. [name] Primary Care [place]: [facility], Romelle Starcher Other Clinician: Referring Jenessa Gillingham: Treating Delorice Bannister/Extender:Robson, Peggyann Juba, Jairo Ben in Treatment: 13 Visit Information History Since Last Visit Added or deleted any medications: No Patient Arrived: Ambulatory Any new allergies or adverse reactions: No Arrival Time: 15:43 Had a fall or experienced change in No Accompanied By: self activities of daily living that may affect Transfer Assistance: None risk of falls: Patient Identification Verified: Yes Signs or symptoms of abuse/neglect since last No Secondary Verification Process Completed: Yes visito Patient Requires Transmission-Based No Hospitalized since last visit: No Precautions: Implantable device outside of the clinic excluding No Patient Has Alerts: Yes cellular tissue based products placed in the center Patient Alerts: L ABI = since last visit: 0.93 Has Dressing in Place as Prescribed: Yes R ABI = Pain Present Now: No 0.95 Electronic Signature(s) Signed: 01/30/2019 3:02:15 PM By: Sandre Kitty Entered By: Sandre Kitty on 12/14/2018 15:43:40 -------------------------------------------------------------------------------- Compression Therapy Details Patient Name: Date of Service: Russell Collier, Russell Collier 12/14/2018 3:30 PM Medical Record [id number] Patient Account Number: [id number] Date of Birth/Sex: Treating RN: 02-20-1961 (58 y.o. [name] Primary Care Ayden Apodaca: Meryle Ready Other Clinician: Referring Cardarius Senat: Treating Agusta Hackenberg/Extender:Robson, [employer], Romelle Starcher Weeks in Treatment: 13 Compression Therapy Performed for Wound Wound #1 Left,Medial Lower  Leg Assessment: Performed By: Clinician Kela Millin, RN Compression Type: Three Layer Pre Treatment ABI: 0.9 Post Procedure Diagnosis Same as Pre-procedure Electronic Signature(s) Signed: 12/14/2018 6:35:09 PM By: Deon Pilling Entered By: Deon Pilling on 12/14/2018 17:48:45 -------------------------------------------------------------------------------- Encounter Discharge Information Details Patient Name: Date of Service: Russell Collier, Russell Collier 12/14/2018 3:30 PM Medical Record [id number] Patient Account Number: [id number] Date of Birth/Sex: Treating RN: 1960-08-08 (58 y.o. [name] Primary Care Jacia Sickman: [place], Romelle Starcher Other Clinician: Referring Lyanne Kates: Treating Aariv Medlock/Extender:Robson, Peggyann Juba, Jairo Ben in Treatment: 13 Encounter Discharge Information Items Post Procedure Vitals Discharge Condition: Stable Temperature (F): 98.6 Ambulatory Status: Ambulatory Pulse (bpm): 92 Discharge Destination: Home Respiratory Rate (breaths/min): 18 Transportation: Private Auto Blood Pressure (mmHg): 145/96 Accompanied By: self Schedule Follow-up Appointment: Yes Clinical Summary of Care: Patient Declined Electronic Signature(s) Signed: 12/18/2018 5:21:50 PM By: Kela Millin Entered By: Kela Millin on 12/14/2018 16:58:59 -------------------------------------------------------------------------------- Lower Extremity Assessment Details Patient Name: Date of Service: Russell Collier, Russell Collier 12/14/2018 3:30 PM Medical Record [id number] Patient Account Number: [id number] Date of Birth/Sex: Treating RN: [birth year] (59 y.o. Janyth Contes Primary Care Timmia Cogburn: Meryle Ready Other Clinician: Referring Stephanos Fan: Treating Rasaan Brotherton/Extender:Robson, Peggyann Juba, Romelle Starcher Weeks in Treatment: 13 Edema Assessment Assessed: [Left: No] [Right: No] Edema: [Left: Ye] [Right: s] Calf Left: Right: Point of Measurement: cm From Medial Instep 44 cm  cm Ankle Left: Right: Point of Measurement: cm From Medial Instep 21.8 cm cm Vascular Assessment Pulses: Dorsalis Pedis Palpable: [Left:Yes] Electronic Signature(s) Signed: 12/15/2018 5:59:58 PM By: Levan Hurst RN, BSN Entered By: Levan Hurst on 12/14/2018 16:12:19 -------------------------------------------------------------------------------- Multi Wound Chart Details Patient Name: Date of Service: Russell Collier, Russell Collier 12/14/2018 3:30 PM Medical Record [id number] Patient Account Number: [id number] Date of Birth/Sex: Treating RN: December 29, 1960 (58 y.o. 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, Asthma, Sleep Apnea, Angina, Congestive Heart Failure, Coronary Artery Disease, Hypertension, Osteoarthritis, Neuropathy] [N/A:N/A] Date Acquired: [1:07/13/2018] [N/A:N/A] Weeks of Treatment: [1:13] [N/A:N/A] Wound Status: [1:Open] [N/A:N/A] Measurements L x W x D 0.4x0.5x0.1 [N/A:N/A] (cm) Area (cm) : [1:0.157] [N/A:N/A] Volume (cm) : [1:0.016] [N/A:N/A N/A] % Reduction in Area: [1:97.10%] [N/A:N/A N/A] % Reduction in Volume: [1:97.00%] [N/A:N/A N/A] Classification: [1:Full Thickness Without Exposed Support Structures] [N/A:N/A N/A] Exudate Amount: [1:Small] [N/A:N/A N/A] Exudate Type: [1:Serosanguineous] [N/A:N/A N/A] Exudate Color: [1:red, brown] [N/A:N/A N/A] Wound Margin: [1:Distinct, outline attached N/A] [N/A:N/A] Granulation Amount: [1:Medium (34-66%)] [N/A:N/A  N/A] Granulation Quality: [1:Pink] [N/A:N/A N/A] Necrotic Amount: [1:Medium (34-66%)] [N/A:N/A N/A] Exposed Structures: [1:Fat Layer (Subcutaneous N/A Tissue) Exposed: Yes Fascia: No Tendon: No Muscle: No Joint: No Bone: No] [N/A:N/A] Epithelialization: [1:Medium (34-66%)] [N/A:N/A N/A] Debridement: [1:Debridement - Selective/Open Wound] [N/A:N/A N/A] Pre-procedure [1:16:25] [N/A:N/A N/A] Verification/Time Out Taken: Pain Control: [1:Lidocaine 4% Topical Solution] [N/A:N/A N/A] Level: [1:Skin/Dermis] [N/A:N/A N/A] Debridement Area (sq cm):0.2 [N/A:N/A N/A] Instrument: [1:Curette] [N/A:N/A N/A] Bleeding: [1:Minimum] [N/A:N/A N/A] Hemostasis Achieved: [1:Pressure] [N/A:N/A N/A] Procedural Pain: [1:0] [N/A:N/A N/A] Post Procedural Pain: [1:0] [N/A:N/A N/A] Debridement Treatment Procedure was tolerated [N/A:N/A N/A] Response: [1:well] Post Debridement [1:0.4x0.5x0.1] [N/A:N/A N/A] Measurements L x W x D (cm) Post Debridement [1:0.016] [N/A:N/A N/A] Volume: (cm) Procedures Performed: Debridement [N/A:N/A N/A] Treatment Notes Wound #1 (Left, Medial Lower Leg) 1. Cleanse With Wound Cleanser Soap and water 2. Periwound Care Moisturizing lotion 3. Primary Dressing Applied Endoform 4. Secondary Dressing Dry Gauze Foam 6. Support Layer Applied 3 layer compression wrap Notes foam to ankle area. stretch net Electronic Signature(s) Signed: 12/14/2018 6:29:04 PM By: Linton Ham MD Signed: 12/14/2018 6:35:09 PM By: Deon Pilling Entered By: Linton Ham on 12/14/2018 17:42:36 -------------------------------------------------------------------------------- Multi-Disciplinary Care Plan Details Patient Name: Date of Service: Russell Collier, Russell Collier 12/14/2018 3:30 PM Medical Record [id number] Patient Account Number: [id number] Date of Birth/Sex: Treating RN: 17-Jul-1960 (58 y.o. [name] Primary Care Deunta Beneke: [place], Romelle Starcher Other Clinician: Referring Kaniya Trueheart:  Treating Eluzer Howdeshell/Extender:Robson, Peggyann Juba, Romelle Starcher Weeks in Treatment: 13 Active Inactive Pain, Acute or Chronic Nursing Diagnoses: Pain, acute or chronic: actual or potential Potential alteration in comfort, pain Goals: Patient will verbalize adequate pain control and receive pain control interventions during procedures as needed Date Initiated: 09/14/2018 Target Resolution Date: 12/22/2018 Goal Status: Active Patient/caregiver will verbalize comfort level met Date Initiated: 09/14/2018 Date Inactivated: 11/09/2018 Target Resolution Date: 11/24/2018 Goal Status: Met Interventions: Provide education on pain management Reposition patient for comfort Treatment Activities: Administer pain control measures as ordered : 09/14/2018 Notes: Wound/Skin Impairment Nursing Diagnoses: Knowledge deficit related to ulceration/compromised skin integrity Goals: Patient/caregiver will verbalize understanding of skin care regimen Date Initiated: 09/14/2018 Date Inactivated: 10/26/2018 Target Resolution Date: 11/03/2018 Goal Status: Met Ulcer/skin breakdown will heal within 14 weeks Date Initiated: 09/14/2018 Target Resolution Date: 01/19/2019 Goal Status: Active Interventions: Assess patient/caregiver ability to obtain necessary supplies Assess patient/caregiver ability to perform ulcer/skin care regimen upon admission and as needed Provide education on smoking Provide education on ulcer and skin care Treatment Activities: Skin care regimen initiated : 09/14/2018 Topical wound management initiated : 09/14/2018 Notes: Electronic Signature(s) Signed: 12/14/2018 6:35:09 PM By: Deon Pilling Entered By: Deon Pilling on 12/14/2018 15:52:58 -------------------------------------------------------------------------------- Pain Assessment Details Patient Name: Date of Service: Russell Collier, Russell Collier 12/14/2018 3:30 PM Medical Record [id number] Patient Account Number: [id number] Date of  Birth/Sex: Treating RN: 08/20/1960 (58 y.o. [name] Primary Care Jakaylee Sasaki: [place], [facility]  Other Clinician: Referring Myrl Lazarus: Treating Kimela Malstrom/Extender:Robson, Peggyann Juba, Romelle Starcher Weeks in Treatment: 13 Active Problems Location of Pain Severity and Description of Pain Patient Has Paino No Site Locations Pain Management and Medication Current Pain Management: Electronic Signature(s) Signed: 12/14/2018 6:35:09 PM By: Deon Pilling Signed: 01/30/2019 3:02:15 PM By: Sandre Kitty Entered By: Sandre Kitty on 12/14/2018 15:45:35 -------------------------------------------------------------------------------- Patient/Caregiver Education Details Patient Name: Date of Service: Russell Collier, Russell Collier 10/22/2020andnbsp3:30 PM Medical Record [id number] Patient Account Number: [id number] Date of Birth/Gender: 09-Jul-1960 (58 y.o. M) Treating RN: Deon Pilling Primary Care Physician: Meryle Ready Other Clinician: Referring Physician: Treating Physician/Extender:Robson, Peggyann Juba, Jairo Ben in Treatment: 13 Education Assessment Education Provided To: Patient Education Topics Provided Pain: Handouts: A Guide to Pain Control Methods: Explain/Verbal Responses: Reinforcements needed Electronic Signature(s) Signed: 12/14/2018 6:35:09 PM By: Deon Pilling Entered By: Deon Pilling on 12/14/2018 15:53:08 -------------------------------------------------------------------------------- Wound Assessment Details Patient Name: Date of Service: Russell Collier, Russell Collier 12/14/2018 3:30 PM Medical Record [id number] Patient Account Number: [id number] Date of Birth/Sex: Treating RN: [birth year] (59 y.o. [name] Primary Care [place]: [facility], Romelle Starcher Other Clinician: Referring Ronit Cranfield: Treating Evalyne Cortopassi/Extender:Robson, Peggyann Juba, Romelle Starcher Weeks in Treatment: 13 Wound Status Wound Number: 1 Primary Venous Leg Ulcer Etiology: Wound Location: Left Lower Leg  - Medial Wound Open Wounding Event: Trauma Status: Date Acquired: 07/13/2018 ComorbidHemophilia, Asthma, Sleep Apnea, Angina, Weeks Of Treatment: 13 Weeks Of Treatment: 13 History: Congestive Heart Failure, Coronary Artery Clustered Wound: No Disease, Hypertension, Osteoarthritis, Neuropathy Photos Wound Measurements Length: (cm) 0.4 % Reduct Width: (cm) 0.5 % Reduct Depth: (cm) 0.1 Epitheli Area: (cm) 0.157 Tunneli Volume: (cm) 0.016 Undermi Wound Description Classification: Full Thickness Without Exposed Support Foul Od Structures Slough/ Wound Distinct, outline attached Margin: Exudate Small Amount: Exudate Serosanguineous Type: Exudate red, brown Color: Wound Bed Granulation Amount: Medium (34-66%) Granulation Quality: Pink Fascia Necrotic Amount: Medium (34-66%) Fat Lay Necrotic Quality: Adherent Slough Tendon Muscle Joint E Bone Ex or After Cleansing: No Fibrino Yes Exposed Structure Exposed: No er (Subcutaneous Tissue) Exposed: Yes Exposed: No Exposed: No xposed: No posed: No ion in Area: 97.1% ion in Volume: 97% alization: Medium (34-66%) ng: No ning: No Electronic Signature(s) Signed: 12/19/2018 3:44:28 PM By: Mikeal Hawthorne EMT/HBOT Signed: 12/19/2018 5:02:12 PM By: Deon Pilling Previous Signature: 12/14/2018 6:35:09 PM Version By: Deon Pilling Previous Signature: 12/15/2018 5:59:58 PM Version By: Levan Hurst RN, BSN Entered By: Mikeal Hawthorne on 12/19/2018 12:18:49 -------------------------------------------------------------------------------- Vitals Details Patient Name: Date of Service: Russell Collier, Russell Collier 12/14/2018 3:30 PM Medical Record [id number] Patient Account Number: [id number] Date of Birth/Sex: Treating RN: 11-06-1960 (58 y.o. [name] Primary Care Troyce Gieske: [place], Romelle Starcher Other Clinician: Referring Jonan Seufert: Treating Rufino Staup/Extender:Robson, Peggyann Juba, Romelle Starcher Weeks in Treatment: 13 Vital Signs Time  Taken: 15:43 Temperature (F): 98.6 Height (in): 71 Pulse (bpm): 92 Weight (lbs): 320 Respiratory Rate (breaths/min): 18 Body Mass Index (BMI): 44.6 Blood Pressure (mmHg): 145/96 Reference Range: 80 - 120 mg / dl Electronic Signature(s) Signed: 01/30/2019 3:02:15 PM By: Sandre Kitty Entered By: Sandre Kitty on 12/14/2018 15:45:23

## 2019-01-30 NOTE — Telephone Encounter (Signed)
-----   Message from Jolaine Artist, MD sent at 01/29/2019  7:51 PM EST ----- Regarding: RE: Madaline Brilliant to participate in Cardiac rehab with high covid Score He would be great for CR! Please enroll if you can ----- Message ----- From: Rowe Pavy, RN Sent: 01/29/2019   1:32 PM EST To: Jolaine Artist, MD Subject: Ok to participate in Cardiac rehab with high#    Dr. Haroldine Laws  We are seeing patients on-site for cardiac rehab . A great deal of planning with advisement from our Medical Director - Dr. Radford Pax, CV Service line leadership, Infection Disease Control, Facilities, security, recommendations from American Association of Cardiac and Pulmonary Rehab (AACVPR) with the goal for optimal patient safety. Patients will have strict guidelines and criteria they must adhere to and follow. Patients will wear a mask during exercise and practice social distancing. Patients will have to complete screening prior to entry into gym area.   Your patient expressed interest in participating in facility cardiac rehab. Patient has a High COVID-19 risk score of 8. Do you feel this patient is appropriate exercise in person cardiac rehab? Any additional restrictions you feel are appropriate specifically for this patient? We may be able to help with his transportation barrier.     Thank you and we appreciate your input Maurice Small RN, BSN Cardiac and Pulmonary Rehab Nurse Navigator   Cardiac Rehab Staff

## 2019-01-31 DIAGNOSIS — E559 Vitamin D deficiency, unspecified: Secondary | ICD-10-CM | POA: Diagnosis not present

## 2019-01-31 DIAGNOSIS — E1142 Type 2 diabetes mellitus with diabetic polyneuropathy: Secondary | ICD-10-CM | POA: Diagnosis not present

## 2019-01-31 DIAGNOSIS — Z79899 Other long term (current) drug therapy: Secondary | ICD-10-CM | POA: Diagnosis not present

## 2019-01-31 DIAGNOSIS — D539 Nutritional anemia, unspecified: Secondary | ICD-10-CM | POA: Diagnosis not present

## 2019-01-31 DIAGNOSIS — Z1159 Encounter for screening for other viral diseases: Secondary | ICD-10-CM | POA: Diagnosis not present

## 2019-01-31 DIAGNOSIS — E782 Mixed hyperlipidemia: Secondary | ICD-10-CM | POA: Diagnosis not present

## 2019-02-01 ENCOUNTER — Other Ambulatory Visit: Payer: Self-pay | Admitting: Gastroenterology

## 2019-02-02 ENCOUNTER — Other Ambulatory Visit (HOSPITAL_COMMUNITY)
Admission: RE | Admit: 2019-02-02 | Discharge: 2019-02-02 | Disposition: A | Discharge status: Home / Self Care | Payer: Medicaid Other | Source: Ambulatory Visit | Attending: Gastroenterology | Admitting: Gastroenterology

## 2019-02-02 DIAGNOSIS — Z20828 Contact with and (suspected) exposure to other viral communicable diseases: Secondary | ICD-10-CM | POA: Diagnosis not present

## 2019-02-02 DIAGNOSIS — Z01812 Encounter for preprocedural laboratory examination: Secondary | ICD-10-CM | POA: Insufficient documentation

## 2019-02-03 LAB — NOVEL CORONAVIRUS, NAA (HOSP ORDER, SEND-OUT TO REF LAB; TAT 18-24 HRS): SARS-CoV-2, NAA: NOT DETECTED

## 2019-02-06 ENCOUNTER — Encounter (HOSPITAL_COMMUNITY): Payer: Self-pay | Admitting: Gastroenterology

## 2019-02-06 ENCOUNTER — Ambulatory Visit (HOSPITAL_COMMUNITY): Payer: Medicaid Other | Admitting: Anesthesiology

## 2019-02-06 ENCOUNTER — Encounter (HOSPITAL_COMMUNITY)
Admission: RE | Disposition: A | Discharge status: Home / Self Care | Payer: Self-pay | Source: Home / Self Care | Attending: Gastroenterology

## 2019-02-06 ENCOUNTER — Other Ambulatory Visit: Payer: Self-pay

## 2019-02-06 ENCOUNTER — Ambulatory Visit (HOSPITAL_COMMUNITY)
Admission: RE | Admit: 2019-02-06 | Discharge: 2019-02-06 | Disposition: A | Discharge status: Home / Self Care | Payer: Medicaid Other | Attending: Gastroenterology | Admitting: Gastroenterology

## 2019-02-06 DIAGNOSIS — K219 Gastro-esophageal reflux disease without esophagitis: Secondary | ICD-10-CM | POA: Diagnosis not present

## 2019-02-06 DIAGNOSIS — K2970 Gastritis, unspecified, without bleeding: Secondary | ICD-10-CM | POA: Diagnosis not present

## 2019-02-06 DIAGNOSIS — Z7982 Long term (current) use of aspirin: Secondary | ICD-10-CM | POA: Insufficient documentation

## 2019-02-06 DIAGNOSIS — Z6841 Body Mass Index (BMI) 40.0 and over, adult: Secondary | ICD-10-CM | POA: Diagnosis not present

## 2019-02-06 DIAGNOSIS — K259 Gastric ulcer, unspecified as acute or chronic, without hemorrhage or perforation: Secondary | ICD-10-CM | POA: Diagnosis not present

## 2019-02-06 DIAGNOSIS — M1990 Unspecified osteoarthritis, unspecified site: Secondary | ICD-10-CM | POA: Insufficient documentation

## 2019-02-06 DIAGNOSIS — I110 Hypertensive heart disease with heart failure: Secondary | ICD-10-CM | POA: Insufficient documentation

## 2019-02-06 DIAGNOSIS — E7800 Pure hypercholesterolemia, unspecified: Secondary | ICD-10-CM | POA: Insufficient documentation

## 2019-02-06 DIAGNOSIS — F17200 Nicotine dependence, unspecified, uncomplicated: Secondary | ICD-10-CM | POA: Insufficient documentation

## 2019-02-06 DIAGNOSIS — Z79899 Other long term (current) drug therapy: Secondary | ICD-10-CM | POA: Insufficient documentation

## 2019-02-06 DIAGNOSIS — G4730 Sleep apnea, unspecified: Secondary | ICD-10-CM | POA: Insufficient documentation

## 2019-02-06 DIAGNOSIS — I509 Heart failure, unspecified: Secondary | ICD-10-CM | POA: Insufficient documentation

## 2019-02-06 DIAGNOSIS — I25110 Atherosclerotic heart disease of native coronary artery with unstable angina pectoris: Secondary | ICD-10-CM | POA: Diagnosis not present

## 2019-02-06 DIAGNOSIS — K2100 Gastro-esophageal reflux disease with esophagitis, without bleeding: Secondary | ICD-10-CM | POA: Diagnosis not present

## 2019-02-06 DIAGNOSIS — R12 Heartburn: Secondary | ICD-10-CM | POA: Insufficient documentation

## 2019-02-06 HISTORY — PX: ESOPHAGOGASTRODUODENOSCOPY (EGD) WITH PROPOFOL: SHX5813

## 2019-02-06 SURGERY — ESOPHAGOGASTRODUODENOSCOPY (EGD) WITH PROPOFOL
Anesthesia: Monitor Anesthesia Care

## 2019-02-06 MED ORDER — LACTATED RINGERS IV SOLN
INTRAVENOUS | Status: DC
  Administered 2019-02-06: 10:00:00 1000 mL via INTRAVENOUS

## 2019-02-06 MED ORDER — SODIUM CHLORIDE 0.9 % IV SOLN: INTRAVENOUS | Status: DC

## 2019-02-06 MED ORDER — PROPOFOL 500 MG/50ML IV EMUL
INTRAVENOUS | Status: DC | PRN
  Administered 2019-02-06: 50 mg via INTRAVENOUS
  Administered 2019-02-06: 20 mg via INTRAVENOUS

## 2019-02-06 MED ORDER — PROPOFOL 500 MG/50ML IV EMUL
INTRAVENOUS | Status: AC
  Filled 2019-02-06: qty 50

## 2019-02-06 MED ORDER — PROPOFOL 500 MG/50ML IV EMUL
INTRAVENOUS | Status: DC | PRN
  Administered 2019-02-06: 200 ug/kg/min via INTRAVENOUS

## 2019-02-06 MED FILL — VYNDAMAX 61 MG CAPS: 61 | 30 days supply | Qty: 30 | Fill #2

## 2019-02-06 SURGICAL SUPPLY — 14 items

## 2019-02-06 NOTE — Anesthesia Procedure Notes (Signed)
Date/Time: 02/06/2019 10:05 AM Performed by: Glory Buff, CRNA Oxygen Delivery Method: Simple face mask

## 2019-02-06 NOTE — Discharge Instructions (Signed)
YOU HAD AN ENDOSCOPIC PROCEDURE TODAY: Refer to the procedure report and other information in the discharge instructions given to you for any specific questions about what was found during the examination. If this information does not answer your questions, please call [facility] office at [phone] to clarify.  ° °YOU SHOULD EXPECT: Some feelings of bloating in the abdomen. Passage of more gas than usual. Walking can help get rid of the air that was put into your GI tract during the procedure and reduce the bloating. If you had a lower endoscopy (such as a colonoscopy or flexible sigmoidoscopy) you may notice spotting of blood in your stool or on the toilet paper. Some abdominal soreness may be present for a day or two, also. ° °DIET: Your first meal following the procedure should be a light meal and then it is ok to progress to your normal diet. A half-sandwich or bowl of soup is an example of a good first meal. Heavy or fried foods are harder to digest and may make you feel nauseous or bloated. Drink plenty of fluids but you should avoid alcoholic beverages for 24 hours. If you had a esophageal dilation, please see attached instructions for diet.  ° °ACTIVITY: Your care partner should take you home directly after the procedure. You should plan to take it easy, moving slowly for the rest of the day. You can resume normal activity the day after the procedure however YOU SHOULD NOT DRIVE, use power tools, machinery or perform tasks that involve climbing or major physical exertion for 24 hours (because of the sedation medicines used during the test).  ° °SYMPTOMS TO REPORT IMMEDIATELY: °A gastroenterologist can be reached at any hour. Please call [phone]  for any of the following symptoms:  °• Following lower endoscopy (colonoscopy, flexible sigmoidoscopy) °Excessive amounts of blood in the stool  °Significant tenderness, worsening of abdominal pains  °Swelling of the abdomen that is new, acute  °Fever of 100°  or higher  °• Following upper endoscopy (EGD, EUS, ERCP, esophageal dilation) °Vomiting of blood or coffee ground material  °New, significant abdominal pain  °New, significant chest pain or pain under the shoulder blades  °Painful or persistently difficult swallowing  °New shortness of breath  °Black, tarry-looking or red, bloody stools ° °FOLLOW UP:  °If any biopsies were taken you will be contacted by phone or by letter within the next 1-3 weeks. Call [phone]  if you have not heard about the biopsies in 3 weeks.  °Please also call with any specific questions about appointments or follow up tests. °

## 2019-02-06 NOTE — H&P (Signed)
Date of Initial H&P: 02/01/19  History reviewed, patient examined, no change in status, stable for surgery.

## 2019-02-06 NOTE — Transfer of Care (Signed)
Immediate Anesthesia Transfer of Care Note  Patient: Russell Collier  Procedure(s) Performed: ESOPHAGOGASTRODUODENOSCOPY (EGD) WITH PROPOFOL (N/A )  Patient Location: PACU  Anesthesia Type:MAC  Level of Consciousness: drowsy, patient cooperative and responds to stimulation  Airway & Oxygen Therapy: Patient Spontanous Breathing and Patient connected to face mask oxygen  Post-op Assessment: Report given to RN and Post -op Vital signs reviewed and stable  Post vital signs: Reviewed and stable  Last Vitals:  Vitals Value Taken Time  BP    Temp    Pulse    Resp    SpO2      Last Pain:  Vitals:   02/06/19 0907  TempSrc: Oral  PainSc: 7          Complications: No apparent anesthesia complications

## 2019-02-06 NOTE — Op Note (Signed)
[facility] Patient Name: Russell Collier Procedure Date: 02/06/2019 MRN: [id number] Attending MD: Lear Ng , MD Date of Birth: [birth year] CSN: [id number] Age: 58 Admit Type: Outpatient Procedure:                Upper GI endoscopy Indications:              Heartburn, Esophageal reflux Providers:                Lear Ng, MD, Cleda Daub, RN, Janeece Agee, Technician, Elspeth Cho Tech.,                            Technician, Rosario Adie, CRNA Referring MD:              Medicines:                Propofol per Anesthesia, Monitored Anesthesia Care Complications:            No immediate complications. Estimated Blood Loss:     Estimated blood loss: none. Procedure:                Pre-Anesthesia Assessment:                           - Prior to the procedure, a History and Physical                            was performed, and patient medications and                            allergies were reviewed. The patient's tolerance of                            previous anesthesia was also reviewed. The risks                            and benefits of the procedure and the sedation                            options and risks were discussed with the patient.                            All questions were answered, and informed consent                            was obtained. Prior Anticoagulants: The patient has                            taken Plavix (clopidogrel). ASA Grade Assessment:                            III - A patient with severe systemic disease. After  reviewing the risks and benefits, the patient was                            deemed in satisfactory condition to undergo the                            procedure.                           After obtaining informed consent, the endoscope was                            passed under direct vision. Throughout the   procedure, the patient's blood pressure, pulse, and                            oxygen saturations were monitored continuously. The                            [id number] Olympus gastroscope was                            introduced through the mouth, and advanced to the                            second part of duodenum. The upper GI endoscopy was                            accomplished without difficulty. The patient                            tolerated the procedure well. Scope In: Scope Out: Findings:      The examined esophagus was normal.      The Z-line was regular and was found 40 cm from the incisors.      One non-bleeding superficial gastric ulcer with no stigmata of bleeding       was found in the prepyloric region of the stomach. The lesion was 3 mm       in largest dimension.      Segmental mild inflammation characterized by congestion (edema) and       linear erosions was found in the prepyloric region of the stomach.      The examined duodenum was normal. Impression:               - Normal esophagus.                           - Z-line regular, 40 cm from the incisors.                           - Non-bleeding gastric ulcer with no stigmata of                            bleeding.                           -  Gastritis.                           - Normal examined duodenum.                           - No specimens collected. Moderate Sedation:      Not Applicable - Patient had care per Anesthesia. Recommendation:           - Patient has a contact number available for                            emergencies. The signs and symptoms of potential                            delayed complications were discussed with the                            patient. Return to normal activities tomorrow.                            Written discharge instructions were provided to the                            patient.                           - Follow an antireflux regimen.                            - Use Protonix (pantoprazole) 40 mg PO daily.                           - No ibuprofen, naproxen, or other non-steroidal                            anti-inflammatory drugs. Procedure Code(s):        --- Professional ---                           [phone], Esophagogastroduodenoscopy, flexible,                            transoral; diagnostic, including collection of                            specimen(s) by brushing or washing, when performed                            (separate procedure) Diagnosis Code(s):        --- Professional ---                           K21.9, Gastro-esophageal reflux disease without                            esophagitis  K25.9, Gastric ulcer, unspecified as acute or                            chronic, without hemorrhage or perforation                           K29.70, Gastritis, unspecified, without bleeding                           R12, Heartburn CPT copyright 2019 [employer]. All rights reserved. The codes documented in this report are preliminary and upon coder review may  be revised to meet current compliance requirements. Lear Ng, MD 02/06/2019 10:28:17 AM This report has been signed electronically. Number of Addenda: 0

## 2019-02-06 NOTE — Interval H&P Note (Signed)
History and Physical Interval Note:  02/06/2019 10:07 AM  Russell Collier  has presented today for surgery, with the diagnosis of GERD.  The various methods of treatment have been discussed with the patient and family. After consideration of risks, benefits and other options for treatment, the patient has consented to  Procedure(s): ESOPHAGOGASTRODUODENOSCOPY (EGD) WITH PROPOFOL (N/A) as a surgical intervention.  The patient's history has been reviewed, patient examined, no change in status, stable for surgery.  I have reviewed the patient's chart and labs.  Questions were answered to the patient's satisfaction.     Lear Ng

## 2019-02-06 NOTE — Anesthesia Preprocedure Evaluation (Addendum)
Anesthesia Evaluation  Patient identified by MRN, date of birth, ID band Patient awake    Reviewed: Allergy & Precautions, NPO status , Patient's Chart, lab work & pertinent test results  History of Anesthesia Complications Negative for: history of anesthetic complications  Airway Mallampati: II  TM Distance: >3 FB Neck ROM: Full    Dental  (+) Dental Advisory Given   Pulmonary shortness of breath, sleep apnea and Oxygen sleep apnea , neg recent URI, Current SmokerPatient did not abstain from smoking.,    breath sounds clear to auscultation       Cardiovascular hypertension, (-) angina+ CAD and +CHF   Rhythm:Regular     Neuro/Psych  Neuromuscular disease negative psych ROS   GI/Hepatic Neg liver ROS, GERD  ,  Endo/Other  Morbid obesity  Renal/GU negative Renal ROS     Musculoskeletal  (+) Arthritis ,   Abdominal   Peds  Hematology   Anesthesia Other Findings   Reproductive/Obstetrics                            Anesthesia Physical Anesthesia Plan  ASA: III  Anesthesia Plan: MAC   Post-op Pain Management:    Induction: Intravenous  PONV Risk Score and Plan: 0 and Propofol infusion and Treatment may vary due to age or medical condition  Airway Management Planned: Nasal Cannula  Additional Equipment: None  Intra-op Plan:   Post-operative Plan:   Informed Consent: I have reviewed the patients History and Physical, chart, labs and discussed the procedure including the risks, benefits and alternatives for the proposed anesthesia with the patient or authorized representative who has indicated his/her understanding and acceptance.     Dental advisory given  Plan Discussed with: CRNA and Surgeon  Anesthesia Plan Comments:         Anesthesia Quick Evaluation

## 2019-02-07 ENCOUNTER — Encounter: Payer: Self-pay | Admitting: *Deleted

## 2019-02-07 NOTE — Anesthesia Postprocedure Evaluation (Signed)
Anesthesia Post Note  Patient: Russell Collier  Procedure(s) Performed: ESOPHAGOGASTRODUODENOSCOPY (EGD) WITH PROPOFOL (N/A )     Anesthesia Post Evaluation  Last Vitals:  Vitals:   02/06/19 1031 02/06/19 1042  BP: (!) 125/55 (!) 141/90  Pulse: 69   Resp: 19 20  Temp: 36.4 C   SpO2: 100%     Last Pain:  Vitals:   02/07/19 1437  TempSrc:   PainSc: 0-No pain                 Glorimar Stroope

## 2019-02-08 ENCOUNTER — Ambulatory Visit: Payer: Medicaid Other | Attending: Internal Medicine | Admitting: Physical Therapy

## 2019-02-08 ENCOUNTER — Encounter: Payer: Self-pay | Admitting: Physical Therapy

## 2019-02-08 ENCOUNTER — Other Ambulatory Visit: Payer: Self-pay

## 2019-02-08 DIAGNOSIS — M6281 Muscle weakness (generalized): Secondary | ICD-10-CM | POA: Diagnosis not present

## 2019-02-08 DIAGNOSIS — G629 Polyneuropathy, unspecified: Secondary | ICD-10-CM | POA: Diagnosis not present

## 2019-02-08 DIAGNOSIS — R258 Other abnormal involuntary movements: Secondary | ICD-10-CM | POA: Diagnosis not present

## 2019-02-08 NOTE — Progress Notes (Signed)
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 [place] [address]  Comprehensive metabolic panel     Status: Abnormal   Collection Time: 01/12/19  2:44 PM  Result Value Ref Range   Sodium 136 135 - 145 mmol/L   Potassium 4.1 3.5 - 5.1 mmol/L   Chloride 105 98 - 111 mmol/L   CO2 21 (L) 22 - 32 mmol/L   Glucose, Bld 101 (H) 70 - 99 mg/dL   BUN 19 6 - 20 mg/dL   Creatinine, Ser 1.61 (H) 0.61 - 1.24 mg/dL   Calcium 8.7 (L) 8.9 - 10.3 mg/dL   Total Protein 6.7 6.5 - 8.1 g/dL   Albumin 3.5 3.5 - 5.0 g/dL   AST 25 15 - 41 U/L   ALT 41 0 - 44 U/L   Alkaline Phosphatase 85 38 - 126 U/L   Total  Bilirubin 1.1 0.3 - 1.2 mg/dL   GFR calc non Af Amer 46 (L) >60 mL/min   GFR calc Af Amer 53 (L) >60 mL/min   Anion gap 10 5 - 15    Comment: Performed at [facility] [address] [place], [place] [address]  Troponin I (High Sensitivity)     Status: Abnormal   Collection Time: 01/12/19  2:44 PM  Result Value Ref Range   Troponin I (High Sensitivity) 18 (H) <18 ng/L    Comment: (NOTE) Elevated high sensitivity troponin I (hsTnI) values and significant  changes across serial measurements may suggest ACS but many other  chronic and acute conditions are known to elevate hsTnI results.  Refer to the "Links" section for chest pain algorithms and additional  guidance. Performed at [facility], [place] [address] [place], [place] [address]   Lipase, blood     Status: None   Collection Time: 01/12/19  2:44 PM  Result Value Ref Range   Lipase 18 11 - 51 U/L    Comment: Performed at [facility], [place] [address] [place], [place] [address]  POC SARS Coronavirus 2 Ag-ED - Nasal Swab (BD Veritor Kit)     Status: None   Collection Time: 01/12/19  3:35 PM  Result Value Ref Range   SARS Coronavirus 2 Ag NEGATIVE NEGATIVE    Comment: (NOTE) SARS-CoV-2 antigen NOT DETECTED.  Negative results are presumptive.  Negative results do not preclude SARS-CoV-2 infection and should not be used as the sole basis for treatment or other patient management decisions,  including infection  control decisions, particularly in the presence of clinical signs and  symptoms consistent with COVID-19, or in those who have been in contact with the virus.  Negative results must be combined with clinical observations, patient history, and epidemiological information. The expected result is Negative. Fact Sheet for Patients: PodPark.tn Fact Sheet for Healthcare Providers: GiftContent.is This test is not yet approved or cleared by the [place] FDA and  has been authorized for detection and/or diagnosis of SARS-CoV-2 by FDA under an Emergency Use Authorization (EUA).  This EUA will remain in effect (meaning this test can be used) for the duration of  the COVID-19 de claration under Section 564(b)(1) of the Act, 21 U.S.C. section 360bbb-3(b)(1), unless the authorization is terminated or revoked sooner.   C difficile quick scan w PCR reflex     Status: None   Collection Time: 01/12/19  4:33 PM   Specimen: Stool  Result Value Ref Range   C Diff antigen NEGATIVE NEGATIVE   C Diff toxin NEGATIVE NEGATIVE   C Diff interpretation No C. difficile detected.     Comment: Performed at [facility], [place] [address] [place], [place] [address]  Troponin I (High Sensitivity)     Status: None   Collection Time: 01/12/19  4:33 PM  Result Value Ref Range   Troponin I (High Sensitivity) 17 <18 ng/L    Comment: (NOTE) Elevated high sensitivity troponin I (hsTnI) values and significant  changes across serial measurements may suggest ACS but many other  chronic and acute conditions are known to elevate hsTnI results.  Refer to the "Links" section for chest pain algorithms and additional  guidance. Performed at [facility], [place] [address] [place], [place] [address]   Novel Coronavirus, NAA (Hosp order, Send-out to Ref Lab; TAT 18-24 hrs     Status: None   Collection Time: 02/02/19 10:25 AM   Specimen: Nasopharyngeal Swab; Respiratory  Result Value Ref Range   SARS-CoV-2, NAA NOT DETECTED NOT DETECTED    Comment: (NOTE) This nucleic acid amplification test was developed and its performance characteristics determined by [employer]. Nucleic acid amplification tests include PCR and TMA. This test has not been FDA cleared or approved. This test has been authorized by FDA under an Emergency Use Authorization (EUA). This test is only authorized for the duration of time the declaration that circumstances exist  justifying the authorization of the emergency use of in vitro diagnostic tests for detection of SARS-CoV-2 virus and/or diagnosis of COVID-19 infection under section 564(b)(1) of the Act, 21 U.S.C. 025KYH-0(W) (1), unless the authorization is terminated or revoked sooner. When diagnostic testing is negative, the possibility of a false negative result should be considered in the context of a patient's recent exposures and the presence of clinical signs and symptoms consistent with COVID-19. An individual without symptoms of COVID- 19 and who is not shedding SARS-CoV-2 vi rus would expect to have a negative (not detected) result in this assay. Performed At: [facility] [place], [place] [id number] Rush Farmer MD [id number]    Coronavirus Source NASOPHARYNGEAL     Comment: Performed at [facility], [place] [address] [place], [place] [address]  Save Smear [facility])     Status: None   Collection Time: 02/09/19 11:44 AM  Result Value Ref Range   Smear Review SMEAR STAINED AND AVAILABLE FOR REVIEW     Comment: Performed at [facility], [address] [place], [place] [address]  Lactate dehydrogenase (LDH)     Status: Abnormal   Collection Time: 02/09/19 11:44 AM  Result Value Ref Range   LDH 199 (H) 98 - 192 U/L    Comment: Performed at [facility], [place] [address] [place], [place] [address]  CMP ([place] only)     Status: Abnormal   Collection Time: 02/09/19 11:44 AM  Result Value Ref Range   Sodium 142 135 - 145 mmol/L   Potassium 3.9 3.5 - 5.1 mmol/L   Chloride 102 98 - 111 mmol/L   CO2 30 22 - 32 mmol/L   Glucose, Bld 102 (H) 70 - 99 mg/dL   BUN 13 6 - 20 mg/dL   Creatinine 1.01 0.61 - 1.24 mg/dL   Calcium 8.9 8.9 - 10.3 mg/dL   Total Protein 7.1 6.5 - 8.1 g/dL   Albumin 3.8 3.5 - 5.0 g/dL   AST 29 15 - 41 U/L   ALT 44 0 - 44 U/L   Alkaline Phosphatase 89 38 - 126 U/L   Total Bilirubin  0.4 0.3 - 1.2 mg/dL   GFR, Est Non Af Am >60 >60 mL/min   GFR, Est AFR Am >60 >60 mL/min   Anion gap 10 5 - 15    Comment: Performed at [facility], [place] [address] [place], [place] [address]  CBC with Differential ([place] Only)     Status: Abnormal   Collection Time: 02/09/19 11:44 AM  Result Value Ref Range   WBC Count 8.7 4.0 - 10.5 K/uL   RBC 4.12 (L) 4.22 - 5.81 MIL/uL   Hemoglobin 12.1 (L) 13.0 - 17.0 g/dL   HCT 37.8 (L) 39.0 - 52.0 %   MCV 91.7 80.0 - 100.0 fL   MCH 29.4 26.0 - 34.0 pg   MCHC 32.0 30.0 - 36.0 g/dL   RDW 16.7 (H) 11.5 - 15.5 %   Platelet Count 269 150 - 400 K/uL   nRBC 0.0 0.0 - 0.2 %   Neutrophils Relative % 64 %   Neutro Abs 5.6 1.7 - 7.7 K/uL   Lymphocytes Relative 22 %   Lymphs Abs 2.0 0.7 - 4.0 K/uL   Monocytes Relative 11 %   Monocytes Absolute 0.9 0.1 - 1.0 K/uL   Eosinophils Relative 2 %   Eosinophils Absolute 0.2 0.0 - 0.5 K/uL   Basophils Relative 0 %   Basophils Absolute 0.0 0.0 - 0.1 K/uL   Immature Granulocytes 1 %   Abs Immature Granulocytes 0.06 0.00 - 0.07 K/uL    Comment: Performed at [facility], [address], [place], [place] [address]    PATHOLOGY: None relevant to review  BLOOD FILM:  Review of the peripheral blood smear showed normal appearing white cells with neutrophils that were appropriately lobated and granulated. There was no predominance of bi-lobed or hyper-segmented neutrophils appreciated. No Dohle bodies were noted. There was no left shifting, immature forms or blasts noted. Lymphocytes remain normal in size without any predominance of large granular lymphocytes. Red cells show no anisopoikilocytosis, macrocytes , microcytes or polychromasia. There were no schistocytes, target cells, echinocytes, acanthocytes, dacrocytes, or stomatocytes.There was no rouleaux formation, nucleated red cells, or intra-cellular inclusions noted. The platelets are normal in size,  shape, and color without any clumping evident.  RADIOGRAPHIC STUDIES: I have personally reviewed the radiological images as listed and agreed with the findings in the report: nonspecific sclerotic lesions of the bone.  CT ABDOMEN PELVIS W CONTRAST  Result Date: 01/12/2019 CLINICAL DATA:  Abdominal pain, right lower quadrant tenderness, appendicitis suspected EXAM: CT ABDOMEN AND PELVIS WITH CONTRAST TECHNIQUE: Multidetector CT imaging of the abdomen and pelvis was performed using the standard protocol following bolus administration of intravenous contrast. CONTRAST:  190m OMNIPAQUE IOHEXOL 300 MG/ML  SOLN COMPARISON:  Same day chest radiograph FINDINGS: Lower chest: Lung bases are clear. Normal heart size. No pericardial effusion. Hepatobiliary: Diffuse hepatic hypoattenuation compatible with hepatic steatosis. No focal liver abnormality is seen. Prominent fold at the gallbladder fundus. No gallstones, gallbladder wall thickening, or biliary dilatation. Pancreas: Unremarkable. No pancreatic ductal dilatation or surrounding inflammatory changes. Spleen: Normal in size without focal abnormality. Adrenals/Urinary Tract: Adrenal glands are unremarkable. Kidneys are normal, without renal calculi, focal lesion, or hydronephrosis. Bladder is unremarkable. Stomach/Bowel: Distal esophagus, stomach and duodenal sweep are unremarkable. No small bowel wall thickening or dilatation. No evidence of obstruction. Normal appendix courses towards midline. No colonic dilatation or wall thickening. Scattered colonic diverticula without focal pericolonic inflammation to suggest diverticulitis. Vascular/Lymphatic: Atherosclerotic plaque within the normal caliber aorta. No suspicious or enlarged lymph nodes in the included lymphatic chains. Reproductive: The prostate and seminal vesicles are unremarkable. Other: No abdominopelvic free fluid or free gas. No bowel containing hernias. Multilevel degenerative changes are present in  the imaged portions of the spine. 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 Less likely etiologies would include malignant causes such as leukemia atypical lymphoma. IMPRESSION: 1. No acute intra-abdominal process. 2. Normal appendix. 3. Hepatic steatosis. 4. 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 Less likely etiologies would include malignant causes such as leukemia atypical lymphoma. 5. Aortic Atherosclerosis (ICD10-I70.0). Electronically Signed   By: PLovena LeM.D.   On: 01/12/2019 15:49   DG Chest Portable 1 View  Result Date: 01/12/2019 CLINICAL DATA:  Midline to left-sided chest pain for 1 day. History of hypertension and prediabetes. EXAM: PORTABLE CHEST 1 VIEW COMPARISON:  Radiographs 12/25/2018 and 12/19/2018. CT 10/15/2018. FINDINGS: 1405 hours. The heart size and mediastinal contours are normal. The lungs are clear. There is no pleural effusion or pneumothorax. No acute osseous findings are identified. Telemetry leads overlie the chest. 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 IMPRESSION: Stable chest. No active cardiopulmonary process. Electronically Signed   By: WRichardean SaleM.D.   On: 01/12/2019 14:20   SLEEP STUDY DOCUMENTS  Result Date: 01/22/2019 Ordered by an unspecified provider.   ASSESSMENT & PLAN ROsiah Haring89y.o. 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  After review of the CT scans and discussion with the patient I do believe that his findings right now represent a rare  benign variant finding.  There is no clear evidence of ongoing active malignancy based on his prior blood work or his current symptoms.  For completeness sake we will recommend ordering a review of the blood including peripheral blood film as well as a hematological malignancy work-up to include SPEP serum free light chains and LDH.  Additionally I will recommend a PSA which can also cause metastatic sclerotic disease, though I would like to emphasize the CT scan findings are not  entirely consistent with this.  Overall I do not think he requires any further follow-up in our clinic for this benign finding.  In the event that he were to develop bone pain, worsening lesions, or other hematological abnormalities we would happily see him back in our clinic for further evaluation.  #Numerous punctate sclerotic radiodensities incidentally found on CT scan --will order repeat CBC, retic panel, CMP, LDH, and a peripheral blood film --to r/o concern for malignancy will order SPEP, SFLC, and PSA.  --no clear indication for further imaging at that time, though could consider a NM bone scan to further evaluate these sclerotic lesions if concern for progression or bone pain.  --no clear indication for biopsy of the bone lesion or bone marrow biopsy as there are no concerning findings in the peripheral blood and no lymphadenopathy on prior imaging.  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 His lesions do not appear destructive or metastatic in nature on my initial review.  --RTC PRN or if labs are concerning. Return precautions for development of any new symptoms, hematological abnormalities, or bone pain.   Orders Placed This Encounter  Procedures  . CBC with Differential (Cancer Center Only)    Standing Status:   Future    Number of Occurrences:   1    Standing Expiration Date:   02/09/2020  . CMP ([facility] only)    Standing Status:    Future    Number of Occurrences:   1    Standing Expiration Date:   02/09/2020  . Lactate dehydrogenase (LDH)    Standing Status:   Future    Number of Occurrences:   1    Standing Expiration Date:   02/09/2020  . SPEP (Serum protein electrophoresis)    Standing Status:   Future    Number of Occurrences:   1    Standing Expiration Date:   02/09/2020  . Kappa/lambda light chains    Standing Status:   Future    Number of Occurrences:   1    Standing Expiration Date:   02/09/2020  . Prostate-Specific AG, Serum    Standing Status:   Future    Number of Occurrences:   1    Standing Expiration Date:   02/09/2020  . Save Smear ([facility])    Standing Status:   Future    Number of Occurrences:   1    Standing Expiration Date:   02/09/2020    All questions were answered. The patient knows to call the clinic with any problems, questions or concerns.  A total of more than 60 minutes were spent on this encounter and over half of that time was spent on counseling and coordination of care as outlined above.   Ledell Peoples, MD Department of Hematology/Oncology [place] at [facility] Phone: [phone] Pager: [phone] Email: [email or web address]  02/09/2019 1:20 PM
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
## 2019-02-09 ENCOUNTER — Inpatient Hospital Stay (HOSPITAL_BASED_OUTPATIENT_CLINIC_OR_DEPARTMENT_OTHER): Payer: Medicaid Other | Admitting: Hematology and Oncology

## 2019-02-09 ENCOUNTER — Other Ambulatory Visit: Payer: Self-pay

## 2019-02-09 ENCOUNTER — Inpatient Hospital Stay: Payer: Medicaid Other | Attending: Hematology and Oncology

## 2019-02-09 VITALS — BP 151/79 | HR 80 | Temp 98.0°F | Resp 17 | Ht 71.0 in | Wt 323.5 lb

## 2019-02-09 DIAGNOSIS — R937 Abnormal findings on diagnostic imaging of other parts of musculoskeletal system: Secondary | ICD-10-CM | POA: Insufficient documentation

## 2019-02-09 DIAGNOSIS — Z86711 Personal history of pulmonary embolism: Secondary | ICD-10-CM

## 2019-02-09 DIAGNOSIS — F17210 Nicotine dependence, cigarettes, uncomplicated: Secondary | ICD-10-CM | POA: Diagnosis not present

## 2019-02-09 DIAGNOSIS — M898X9 Other specified disorders of bone, unspecified site: Secondary | ICD-10-CM

## 2019-02-09 DIAGNOSIS — Z7982 Long term (current) use of aspirin: Secondary | ICD-10-CM | POA: Insufficient documentation

## 2019-02-09 DIAGNOSIS — Z79899 Other long term (current) drug therapy: Secondary | ICD-10-CM

## 2019-02-09 DIAGNOSIS — G4733 Obstructive sleep apnea (adult) (pediatric): Secondary | ICD-10-CM | POA: Diagnosis not present

## 2019-02-09 DIAGNOSIS — I10 Essential (primary) hypertension: Secondary | ICD-10-CM | POA: Diagnosis not present

## 2019-02-09 LAB — CBC WITH DIFFERENTIAL (CANCER CENTER ONLY)
Abs Immature Granulocytes: 0.06 10*3/uL (ref 0.00–0.07)
Basophils Absolute: 0 10*3/uL (ref 0.0–0.1)
Basophils Relative: 0 %
Eosinophils Absolute: 0.2 10*3/uL (ref 0.0–0.5)
Eosinophils Relative: 2 %
HCT: 37.8 % — ABNORMAL LOW (ref 39.0–52.0)
Hemoglobin: 12.1 g/dL — ABNORMAL LOW (ref 13.0–17.0)
Immature Granulocytes: 1 %
Lymphocytes Relative: 22 %
Lymphs Abs: 2 10*3/uL (ref 0.7–4.0)
MCH: 29.4 pg (ref 26.0–34.0)
MCHC: 32 g/dL (ref 30.0–36.0)
MCV: 91.7 fL (ref 80.0–100.0)
Monocytes Absolute: 0.9 10*3/uL (ref 0.1–1.0)
Monocytes Relative: 11 %
Neutro Abs: 5.6 10*3/uL (ref 1.7–7.7)
Neutrophils Relative %: 64 %
Platelet Count: 269 10*3/uL (ref 150–400)
RBC: 4.12 MIL/uL — ABNORMAL LOW (ref 4.22–5.81)
RDW: 16.7 % — ABNORMAL HIGH (ref 11.5–15.5)
WBC Count: 8.7 10*3/uL (ref 4.0–10.5)
nRBC: 0 % (ref 0.0–0.2)

## 2019-02-09 LAB — CMP (CANCER CENTER ONLY)
ALT: 44 U/L (ref 0–44)
AST: 29 U/L (ref 15–41)
Albumin: 3.8 g/dL (ref 3.5–5.0)
Alkaline Phosphatase: 89 U/L (ref 38–126)
Anion gap: 10 (ref 5–15)
BUN: 13 mg/dL (ref 6–20)
CO2: 30 mmol/L (ref 22–32)
Calcium: 8.9 mg/dL (ref 8.9–10.3)
Chloride: 102 mmol/L (ref 98–111)
Creatinine: 1.01 mg/dL (ref 0.61–1.24)
GFR, Est AFR Am: >60 mL/min (ref >60)
GFR, Estimated: >60 mL/min (ref >60)
Glucose, Bld: 102 mg/dL — ABNORMAL HIGH (ref 70–99)
Potassium: 3.9 mmol/L (ref 3.5–5.1)
Sodium: 142 mmol/L (ref 135–145)
Total Bilirubin: 0.4 mg/dL (ref 0.3–1.2)
Total Protein: 7.1 g/dL (ref 6.5–8.1)

## 2019-02-09 LAB — SAVE SMEAR(SSMR), FOR PROVIDER SLIDE REVIEW

## 2019-02-09 LAB — LACTATE DEHYDROGENASE: LDH: 199 U/L — ABNORMAL HIGH (ref 98–192)

## 2019-02-10 LAB — PROSTATE-SPECIFIC AG, SERUM (LABCORP): Prostate Specific Ag, Serum: 0.7 ng/mL (ref 0.0–4.0)

## 2019-02-12 LAB — PROTEIN ELECTROPHORESIS, SERUM
A/G Ratio: 1.2 (ref 0.7–1.7)
Albumin ELP: 3.6 g/dL (ref 2.9–4.4)
Alpha-1-Globulin: 0.2 g/dL (ref 0.0–0.4)
Alpha-2-Globulin: 0.8 g/dL (ref 0.4–1.0)
Beta Globulin: 1.1 g/dL (ref 0.7–1.3)
Gamma Globulin: 0.9 g/dL (ref 0.4–1.8)
Globulin, Total: 3 g/dL (ref 2.2–3.9)
Total Protein ELP: 6.6 g/dL (ref 6.0–8.5)

## 2019-02-12 LAB — KAPPA/LAMBDA LIGHT CHAINS
Kappa free light chain: 29.4 mg/L — ABNORMAL HIGH (ref 3.3–19.4)
Kappa, lambda light chain ratio: 1.21 (ref 0.26–1.65)
Lambda free light chains: 24.2 mg/L (ref 5.7–26.3)

## 2019-02-17 ENCOUNTER — Emergency Department (HOSPITAL_COMMUNITY)
Admission: EM | Admit: 2019-02-17 | Discharge: 2019-02-18 | Disposition: A | Discharge status: Home / Self Care | Payer: Medicaid Other | Attending: Emergency Medicine | Admitting: Emergency Medicine

## 2019-02-17 DIAGNOSIS — Z79899 Other long term (current) drug therapy: Secondary | ICD-10-CM | POA: Diagnosis not present

## 2019-02-17 DIAGNOSIS — I110 Hypertensive heart disease with heart failure: Secondary | ICD-10-CM | POA: Diagnosis not present

## 2019-02-17 DIAGNOSIS — I5032 Chronic diastolic (congestive) heart failure: Secondary | ICD-10-CM | POA: Diagnosis not present

## 2019-02-17 DIAGNOSIS — I2510 Atherosclerotic heart disease of native coronary artery without angina pectoris: Secondary | ICD-10-CM | POA: Diagnosis not present

## 2019-02-17 DIAGNOSIS — F17210 Nicotine dependence, cigarettes, uncomplicated: Secondary | ICD-10-CM | POA: Diagnosis not present

## 2019-02-17 DIAGNOSIS — R002 Palpitations: Secondary | ICD-10-CM | POA: Insufficient documentation

## 2019-02-17 DIAGNOSIS — F17290 Nicotine dependence, other tobacco product, uncomplicated: Secondary | ICD-10-CM | POA: Diagnosis not present

## 2019-02-17 DIAGNOSIS — I10 Essential (primary) hypertension: Secondary | ICD-10-CM | POA: Diagnosis not present

## 2019-02-17 DIAGNOSIS — Z7902 Long term (current) use of antithrombotics/antiplatelets: Secondary | ICD-10-CM | POA: Insufficient documentation

## 2019-02-17 DIAGNOSIS — R918 Other nonspecific abnormal finding of lung field: Secondary | ICD-10-CM | POA: Diagnosis not present

## 2019-02-17 DIAGNOSIS — Z7982 Long term (current) use of aspirin: Secondary | ICD-10-CM | POA: Diagnosis not present

## 2019-02-18 ENCOUNTER — Other Ambulatory Visit: Payer: Self-pay

## 2019-02-18 ENCOUNTER — Emergency Department (HOSPITAL_COMMUNITY): Payer: Medicaid Other

## 2019-02-18 DIAGNOSIS — R918 Other nonspecific abnormal finding of lung field: Secondary | ICD-10-CM | POA: Diagnosis not present

## 2019-02-18 LAB — BASIC METABOLIC PANEL
Anion gap: 9 (ref 5–15)
BUN: 19 mg/dL (ref 6–20)
CO2: 25 mmol/L (ref 22–32)
Calcium: 9 mg/dL (ref 8.9–10.3)
Chloride: 106 mmol/L (ref 98–111)
Creatinine, Ser: 1.12 mg/dL (ref 0.61–1.24)
GFR calc Af Amer: >60 mL/min (ref >60)
GFR calc non Af Amer: >60 mL/min (ref >60)
Glucose, Bld: 122 mg/dL — ABNORMAL HIGH (ref 70–99)
Potassium: 4 mmol/L (ref 3.5–5.1)
Sodium: 140 mmol/L (ref 135–145)

## 2019-02-18 LAB — BRAIN NATRIURETIC PEPTIDE: B Natriuretic Peptide: 26.6 pg/mL (ref 0.0–100.0)

## 2019-02-18 LAB — CBC
HCT: 38.8 % — ABNORMAL LOW (ref 39.0–52.0)
Hemoglobin: 12.5 g/dL — ABNORMAL LOW (ref 13.0–17.0)
MCH: 29.5 pg (ref 26.0–34.0)
MCHC: 32.2 g/dL (ref 30.0–36.0)
MCV: 91.5 fL (ref 80.0–100.0)
Platelets: 266 10*3/uL (ref 150–400)
RBC: 4.24 MIL/uL (ref 4.22–5.81)
RDW: 16.2 % — ABNORMAL HIGH (ref 11.5–15.5)
WBC: 10.4 10*3/uL (ref 4.0–10.5)
nRBC: 0 % (ref 0.0–0.2)

## 2019-02-18 MED ORDER — SODIUM CHLORIDE 0.9% FLUSH: 3.0000 mL | Freq: Once | INTRAVENOUS | Status: DC

## 2019-02-18 NOTE — ED Triage Notes (Signed)
Per pt he has been having heart palpitations x 1week that were similar to him having to have stents placed in November. Pt said for 3 days has gotten worse and then tonight he could feel the heart beats in his ear. Pt said no chest pain no sob. Pt took 324 prior to ems arrival. Pt said he can feel it come and go.

## 2019-02-18 NOTE — Discharge Instructions (Signed)
It was our pleasure to provide your ER care today - we hope that you feel better.  Overall, your lab tests look good, and on your heart monitor, your heart rhythm appears normal.  Follow up with your cardiologist in the coming week - call office tomorrow AM to arrange appointment - discuss possible home heart monitoring/Holter.   Return to ER if worse, new symptoms, fevers, persistent fast heart beat, fainting, chest pain, increased trouble breathing, or other concern.

## 2019-02-18 NOTE — ED Notes (Signed)
Pt verbalized understanding of discharge paperwork and follow-up care.  °
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
## 2019-02-26 ENCOUNTER — Ambulatory Visit: Payer: Medicaid Other | Attending: Internal Medicine | Admitting: Physical Therapy

## 2019-02-26 ENCOUNTER — Other Ambulatory Visit: Payer: Self-pay

## 2019-02-26 ENCOUNTER — Encounter: Payer: Self-pay | Admitting: Physical Therapy

## 2019-02-26 DIAGNOSIS — M6281 Muscle weakness (generalized): Secondary | ICD-10-CM | POA: Diagnosis not present

## 2019-02-26 DIAGNOSIS — G629 Polyneuropathy, unspecified: Secondary | ICD-10-CM
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
## 2019-03-01 ENCOUNTER — Other Ambulatory Visit: Payer: Self-pay

## 2019-03-01 ENCOUNTER — Encounter: Payer: Self-pay | Admitting: Physical Therapy

## 2019-03-01 ENCOUNTER — Ambulatory Visit: Payer: Medicaid Other | Admitting: Physical Therapy

## 2019-03-01 DIAGNOSIS — M6281 Muscle weakness (generalized): Secondary | ICD-10-CM

## 2019-03-01 DIAGNOSIS — G629 Polyneuropathy, unspecified: Secondary | ICD-10-CM | POA: Diagnosis not present
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
## 2019-03-07 ENCOUNTER — Emergency Department (HOSPITAL_COMMUNITY): Payer: Medicaid Other

## 2019-03-07 ENCOUNTER — Emergency Department (HOSPITAL_COMMUNITY)
Admission: EM | Admit: 2019-03-07 | Discharge: 2019-03-07 | Disposition: A | Discharge status: Home / Self Care | Payer: Medicaid Other | Attending: Emergency Medicine | Admitting: Emergency Medicine

## 2019-03-07 ENCOUNTER — Other Ambulatory Visit: Payer: Self-pay

## 2019-03-07 ENCOUNTER — Encounter (HOSPITAL_COMMUNITY): Payer: Self-pay | Admitting: Emergency Medicine

## 2019-03-07 DIAGNOSIS — I2510 Atherosclerotic heart disease of native coronary artery without angina pectoris: Secondary | ICD-10-CM | POA: Diagnosis not present

## 2019-03-07 DIAGNOSIS — R0602 Shortness of breath: Secondary | ICD-10-CM | POA: Diagnosis not present

## 2019-03-07 DIAGNOSIS — Z955 Presence of coronary angioplasty implant and graft: Secondary | ICD-10-CM | POA: Diagnosis not present

## 2019-03-07 DIAGNOSIS — Z7982 Long term (current) use of aspirin: Secondary | ICD-10-CM | POA: Insufficient documentation

## 2019-03-07 DIAGNOSIS — I110 Hypertensive heart disease with heart failure: Secondary | ICD-10-CM | POA: Diagnosis not present

## 2019-03-07 DIAGNOSIS — I5032 Chronic diastolic (congestive) heart failure: Secondary | ICD-10-CM | POA: Insufficient documentation

## 2019-03-07 DIAGNOSIS — Z7902 Long term (current) use of antithrombotics/antiplatelets: Secondary | ICD-10-CM | POA: Insufficient documentation

## 2019-03-07 DIAGNOSIS — J385 Laryngeal spasm: Secondary | ICD-10-CM | POA: Diagnosis not present

## 2019-03-07 DIAGNOSIS — Z79899 Other long term (current) drug therapy: Secondary | ICD-10-CM | POA: Diagnosis not present

## 2019-03-07 DIAGNOSIS — F17210 Nicotine dependence, cigarettes, uncomplicated: Secondary | ICD-10-CM | POA: Insufficient documentation

## 2019-03-07 LAB — CBC
HCT: 38.8 % — ABNORMAL LOW (ref 39.0–52.0)
Hemoglobin: 12.3 g/dL — ABNORMAL LOW (ref 13.0–17.0)
MCH: 29.5 pg (ref 26.0–34.0)
MCHC: 31.7 g/dL (ref 30.0–36.0)
MCV: 93 fL (ref 80.0–100.0)
Platelets: 272 10*3/uL (ref 150–400)
RBC: 4.17 MIL/uL — ABNORMAL LOW (ref 4.22–5.81)
RDW: 15.7 % — ABNORMAL HIGH (ref 11.5–15.5)
WBC: 8 10*3/uL (ref 4.0–10.5)
nRBC: 0 % (ref 0.0–0.2)

## 2019-03-07 LAB — BASIC METABOLIC PANEL
Anion gap: 11 (ref 5–15)
BUN: 14 mg/dL (ref 6–20)
CO2: 21 mmol/L — ABNORMAL LOW (ref 22–32)
Calcium: 8.7 mg/dL — ABNORMAL LOW (ref 8.9–10.3)
Chloride: 103 mmol/L (ref 98–111)
Creatinine, Ser: 1.13 mg/dL (ref 0.61–1.24)
GFR calc Af Amer: >60 mL/min (ref >60)
GFR calc non Af Amer: >60 mL/min (ref >60)
Glucose, Bld: 169 mg/dL — ABNORMAL HIGH (ref 70–99)
Potassium: 3.7 mmol/L (ref 3.5–5.1)
Sodium: 135 mmol/L (ref 135–145)

## 2019-03-07 MED FILL — VYNDAMAX 61 MG CAPS: 61 | 30 days supply | Qty: 30 | Fill #3
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
## 2019-03-07 NOTE — ED Triage Notes (Signed)
Pt arrives via EMS from home with sudden onset SOB. Pt took albuterol at home. Reports he feels like his throat is spasming and has hx of, states he took muscle relaxer for it.

## 2019-03-07 NOTE — ED Notes (Signed)
Patient verbalizes understanding of discharge instructions. Opportunity for questioning and answers were provided. Armband removed by staff, pt discharged from ED.  

## 2019-03-07 NOTE — Discharge Instructions (Addendum)
You have been seen today for laryngospasm. Please read and follow all provided instructions. Return to the emergency room for worsening condition or new concerning symptoms.    Your work-up today included blood work, EKG and chest x-ray.  All were reassuring.  1. Medications:  Continue usual home medications Take medications as prescribed. Please review all of the medicines and only take them if you do not have an allergy to them.   2. Treatment: rest, drink plenty of fluids  3. Follow Up:  Please follow up with primary care provider by scheduling an appointment as soon as possible for a visit  -We also recommend you keep your scheduled appointments for this week including with your heart doctor.   It is also a possibility that you have an allergic reaction to any of the medicines that you have been prescribed - Everybody reacts differently to medications and while MOST people have no trouble with most medicines, you may have a reaction such as nausea, vomiting, rash, swelling, shortness of breath. If this is the case, please stop taking the medicine immediately and contact your physician.  ?

## 2019-03-08 ENCOUNTER — Encounter: Payer: Self-pay | Admitting: Physical Therapy

## 2019-03-08 ENCOUNTER — Ambulatory Visit: Payer: Medicaid Other | Admitting: Physical Therapy

## 2019-03-08 DIAGNOSIS — M6281 Muscle weakness (generalized): Secondary | ICD-10-CM

## 2019-03-08 DIAGNOSIS — G629 Polyneuropathy, unspecified: Secondary | ICD-10-CM
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
## 2019-03-09 ENCOUNTER — Encounter: Payer: Self-pay | Admitting: Neurology

## 2019-03-09 ENCOUNTER — Ambulatory Visit (INDEPENDENT_AMBULATORY_CARE_PROVIDER_SITE_OTHER): Payer: Medicaid Other | Admitting: Neurology

## 2019-03-09 ENCOUNTER — Other Ambulatory Visit: Payer: Self-pay

## 2019-03-09 VITALS — BP 123/70 | HR 81 | Temp 98.5°F | Ht 71.0 in | Wt 320.4 lb

## 2019-03-09 DIAGNOSIS — G629 Polyneuropathy, unspecified: Secondary | ICD-10-CM

## 2019-03-09 MED ORDER — PREGABALIN 100 MG PO CAPS: ORAL_CAPSULE | ORAL | 3 refills | Status: DC
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
## 2019-03-09 NOTE — Patient Instructions (Signed)
Nerve testing of bilateral upper extremities.  Start Lyrica 100mg  at bedtime for one week, then increase to 1 tablet twice daily  Call with update in 3 weeks  Return to clinic in 3 months  [place] (EMG/NCS) INSTRUCTIONS  How to Prepare The neurologist conducting the EMG will need to know if you have certain medical conditions. Tell the neurologist and other EMG lab personnel if you: . Have a pacemaker or any other electrical medical device . Take blood-thinning medications . Have hemophilia, a blood-clotting disorder that causes prolonged bleeding Bathing Take a shower or bath shortly before your exam in order to remove oils from your skin. Don't apply lotions or creams before the exam.  What to Expect You'll likely be asked to change into a hospital gown for the procedure and lie down on an examination table. The following explanations can help you understand what will happen during the exam.  . Electrodes. The neurologist or a technician places surface electrodes at various locations on your skin depending on where you're experiencing symptoms. Or the neurologist may insert needle electrodes at different sites depending on your symptoms.  . Sensations. The electrodes will at times transmit a tiny electrical current that you may feel as a twinge or spasm. The needle electrode may cause discomfort or pain that usually ends shortly after the needle is removed. If you are concerned about discomfort or pain, you may want to talk to the neurologist about taking a short break during the exam.  . Instructions. During the needle EMG, the neurologist will assess whether there is any spontaneous electrical activity when the muscle is at rest - activity that isn't present in healthy muscle tissue - and the degree of activity when you slightly contract the muscle.  He or she will give you instructions on resting and contracting a muscle at appropriate times. Depending  on what muscles and nerves the neurologist is examining, he or she may ask you to change positions during the exam.  After your EMG You may experience some temporary, minor bruising where the needle electrode was inserted into your muscle. This bruising should fade within several days. If it persists, contact your primary care doctor.

## 2019-03-15 ENCOUNTER — Ambulatory Visit: Payer: Medicaid Other | Admitting: Physical Therapy

## 2019-03-15 ENCOUNTER — Encounter: Payer: Self-pay | Admitting: Physical Therapy

## 2019-03-15 ENCOUNTER — Other Ambulatory Visit: Payer: Self-pay

## 2019-03-15 DIAGNOSIS — G629 Polyneuropathy, unspecified: Secondary | ICD-10-CM | POA: Diagnosis not present

## 2019-03-15 DIAGNOSIS — M6281 Muscle weakness (generalized): Secondary | ICD-10-CM | POA: Diagnosis not present
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
## 2019-03-20 ENCOUNTER — Ambulatory Visit (INDEPENDENT_AMBULATORY_CARE_PROVIDER_SITE_OTHER): Payer: Medicaid Other | Admitting: Neurology

## 2019-03-20 ENCOUNTER — Other Ambulatory Visit: Payer: Self-pay

## 2019-03-20 ENCOUNTER — Telehealth: Payer: Self-pay

## 2019-03-20 DIAGNOSIS — G629 Polyneuropathy, unspecified: Secondary | ICD-10-CM

## 2019-03-20 DIAGNOSIS — G5603 Carpal tunnel syndrome, bilateral upper limbs: Secondary | ICD-10-CM

## 2019-03-20 NOTE — Telephone Encounter (Signed)
-----   Message from Alda Berthold, DO sent at 03/20/2019  1:49 PM EST ----- Please inform patient that his EMG shows carpal tunnel syndrome involving both hands, which is severe.  There is no neuropathy. He can try using wrist braces at night time to see if this helps. If no improvement, then the next step is seeking the opinion of hand surgeon to see if surgery would be helpful.  Thanks.

## 2019-03-20 NOTE — Telephone Encounter (Signed)
Pt's last OV note, EMG results, demographics and insurance cards faxed to Dr. Levell July office at [facility]. [phone]

## 2019-03-20 NOTE — Telephone Encounter (Signed)
Pt would like to have 2nd opinion with hand surgeon. He does not want to try wrist braces first.

## 2019-03-20 NOTE — Procedures (Signed)
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  NCV & EMG Findings: Extensive electrodiagnostic testing of the right upper extremity and additional studies of the left shows:  1. Bilateral median sensory responses are absent.  Bilateral ulnar and radial sensory responses are within normal limits. 2. Bilateral median motor responses show severely prolonged latency (R7.4, L10.5 ms) and reduced amplitudes (R0.8, L1.4 mV).  Of note, there is evidence of a median-to-ulnar crossover in the forearm as seen by a motor response stimulating at the ulnar-wrist and recording at the abductor pollicis brevis muscle.  Right ulnar motor response shows slowed conduction velocity across the elbow (A Elbow-B Elbow, 43 m/s).  Left ulnar motor responses within normal limits. 3. Chronic motor axonal loss changes are seen affecting bilateral abductor pollicis brevis muscles, without accompanied active denervation.  Impression: 1. Bilateral median neuropathy at or distal to the wrist (very severe), consistent with a clinical diagnosis of carpal tunnel syndrome.   2. Incidentally, there is evidence of a Martin-Gruber anastomoses bilaterally, a normal anatomic variant. 3. There is no evidence of a sensorimotor polyneuropathy affecting the upper extremities.   ___________________________ Narda Amber, DO    Nerve Conduction Studies Anti Sensory Summary Table   Site NR Peak (ms) Norm Peak (ms) P-T Amp (V) Norm P-T Amp  Left Median Anti Sensory (2nd Digit)  35C  Wrist NR  <3.8  >10  Right Median Anti Sensory (2nd Digit)   35C  Wrist NR  <3.8  >10  Left Radial Anti Sensory (Base 1st Digit)  35C  Wrist    2.3 <2.8 14.5 >10  Right Radial Anti Sensory (Base 1st Digit)  35C  Wrist    2.2 <2.8 14.1 >10  Left Ulnar Anti Sensory (5th Digit)  35C  Wrist    2.9 <3.2 8.4 >5  Right Ulnar Anti Sensory (5th Digit)  35C  Wrist    2.9 <3.2 9.5 >5   Motor Summary Table   Site NR Onset (ms) Norm Onset (ms) O-P Amp (mV) Norm O-P Amp Site1 Site2 Delta-0 (ms) Dist (cm) Vel (m/s) Norm Vel (m/s)  Left Median Motor (Abd Poll Brev)  35C  Wrist    10.5 <4.0 1.6 >5 Elbow Wrist 5.7 32.0 56 >50  Elbow    16.2  1.0  Ulnar-wrist crossover Elbow 11.8 0.0    Ulnar-wrist crossover    4.4  1.8         Right Median Motor (Abd Poll Brev)  35C  Wrist    7.4 <4.0 0.8 >5 Elbow Wrist  30.0  >50  Elbow NR     ulnar-wrist crossover Elbow  0.0    ulnar-wrist crossover    5.0  1.4         Left Ulnar Motor (Abd Dig Minimi)  35C  Wrist    2.7 <3.1 11.0 >7 B Elbow Wrist 5.1 28.0 55 >50  B Elbow    7.8  10.7  A Elbow B Elbow 1.7 10.0 59 >50  A Elbow    9.5  9.9         Right Ulnar Motor (Abd Dig Minimi)  35C  Wrist    2.3 <3.1 11.0 >7 B Elbow Wrist 5.2 28.0 54 >50  B Elbow    7.5  9.7  A Elbow B Elbow 2.3 10.0 43 >50  A Elbow    9.8  9.6          EMG   Side Muscle Ins Act Fibs Psw Fasc Number Recrt Dur Dur. Amp Amp. Poly Poly. Comment  Right 1stDorInt Nml Nml Nml Nml Nml Nml Nml Nml Nml Nml Nml Nml N/A  Right Abd Poll Brev Nml Nml Nml Nml 2- Rapid Many 1+ Many 1+ Many 1+ N/A  Right PronatorTeres Nml Nml Nml Nml Nml Nml Nml Nml Nml Nml Nml Nml N/A  Right Biceps Nml Nml Nml Nml Nml Nml Nml Nml Nml Nml Nml Nml N/A  Right Triceps Nml Nml Nml Nml Nml Nml Nml Nml Nml Nml Nml Nml N/A  Right Deltoid Nml Nml Nml Nml Nml Nml Nml Nml Nml Nml Nml Nml N/A  Left 1stDorInt Nml Nml Nml Nml Nml Nml Nml Nml Nml Nml Nml Nml N/A  Left Abd Poll Brev Nml Nml Nml Nml 2- Rapid Many 1+ Many 1+ Many 1+ N/A  Left PronatorTeres Nml Nml Nml Nml Nml Nml Nml Nml  Nml Nml Nml Nml N/A  Left Biceps Nml Nml Nml Nml Nml Nml Nml Nml Nml Nml Nml Nml N/A  Left Triceps Nml Nml Nml Nml Nml Nml Nml Nml Nml Nml Nml Nml N/A  Left Deltoid Nml Nml Nml Nml Nml Nml Nml Nml Nml Nml Nml Nml N/A      Waveforms:

## 2019-03-20 NOTE — Telephone Encounter (Signed)
[facility] patient

## 2019-03-20 NOTE — Telephone Encounter (Signed)
We will send referral to [facility].

## 2019-03-21 ENCOUNTER — Encounter: Payer: Self-pay | Admitting: Physical Therapy

## 2019-03-21 ENCOUNTER — Ambulatory Visit: Payer: Medicaid Other | Admitting: Physical Therapy

## 2019-03-21 ENCOUNTER — Telehealth: Payer: Self-pay | Admitting: Neurology

## 2019-03-21 DIAGNOSIS — G629 Polyneuropathy, unspecified: Secondary | ICD-10-CM

## 2019-03-21 DIAGNOSIS — M6281 Muscle weakness (generalized): Secondary | ICD-10-CM

## 2019-03-21 NOTE — Telephone Encounter (Signed)
No at [phone]

## 2019-03-21 NOTE — Telephone Encounter (Signed)
Patient called and requested some assistance with getting hand braces that will be covered by his insurance.

## 2019-03-21 NOTE — Telephone Encounter (Signed)
We can refer him to [facility] for bilateral wrist splints who can fit him and file with his insurance.
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
## 2019-03-23 ENCOUNTER — Encounter: Payer: Self-pay | Admitting: Physical Therapy

## 2019-03-23 ENCOUNTER — Other Ambulatory Visit: Payer: Self-pay

## 2019-03-23 ENCOUNTER — Ambulatory Visit: Payer: Medicaid Other | Admitting: Physical Therapy

## 2019-03-23 DIAGNOSIS — M6281 Muscle weakness (generalized): Secondary | ICD-10-CM | POA: Diagnosis not present

## 2019-03-23 DIAGNOSIS — G629 Polyneuropathy, unspecified: Secondary | ICD-10-CM
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
## 2019-03-23 NOTE — Telephone Encounter (Signed)
Patient called to check on the status of the request. °

## 2019-03-23 NOTE — Telephone Encounter (Signed)
Orders faxed

## 2019-03-28 ENCOUNTER — Encounter: Payer: Self-pay | Admitting: Physical Therapy

## 2019-03-28 ENCOUNTER — Other Ambulatory Visit: Payer: Self-pay

## 2019-03-28 ENCOUNTER — Ambulatory Visit: Payer: Medicaid Other | Attending: Internal Medicine | Admitting: Physical Therapy

## 2019-03-28 DIAGNOSIS — M6281 Muscle weakness (generalized): Secondary | ICD-10-CM | POA: Diagnosis not present

## 2019-03-28 DIAGNOSIS — G629 Polyneuropathy, unspecified: Secondary | ICD-10-CM
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
## 2019-03-30 ENCOUNTER — Ambulatory Visit: Payer: Medicaid Other | Admitting: Physical Therapy

## 2019-04-02 ENCOUNTER — Other Ambulatory Visit: Payer: Self-pay

## 2019-04-02 ENCOUNTER — Ambulatory Visit (HOSPITAL_COMMUNITY)
Admission: RE | Admit: 2019-04-02 | Discharge: 2019-04-02 | Disposition: A | Discharge status: Home / Self Care | Payer: Medicaid Other | Source: Ambulatory Visit | Attending: Adult Health | Admitting: Adult Health

## 2019-04-02 ENCOUNTER — Encounter (HOSPITAL_COMMUNITY): Payer: Self-pay

## 2019-04-02 VITALS — BP 110/63 | HR 93 | Wt 323.0 lb

## 2019-04-02 DIAGNOSIS — Z955 Presence of coronary angioplasty implant and graft: Secondary | ICD-10-CM | POA: Diagnosis not present

## 2019-04-02 DIAGNOSIS — E785 Hyperlipidemia, unspecified: Secondary | ICD-10-CM | POA: Insufficient documentation

## 2019-04-02 DIAGNOSIS — G4733 Obstructive sleep apnea (adult) (pediatric): Secondary | ICD-10-CM | POA: Diagnosis not present

## 2019-04-02 DIAGNOSIS — Z7901 Long term (current) use of anticoagulants: Secondary | ICD-10-CM | POA: Insufficient documentation

## 2019-04-02 DIAGNOSIS — E7800 Pure hypercholesterolemia, unspecified: Secondary | ICD-10-CM | POA: Diagnosis not present

## 2019-04-02 DIAGNOSIS — I110 Hypertensive heart disease with heart failure: Secondary | ICD-10-CM | POA: Insufficient documentation

## 2019-04-02 DIAGNOSIS — I2510 Atherosclerotic heart disease of native coronary artery without angina pectoris: Secondary | ICD-10-CM | POA: Insufficient documentation

## 2019-04-02 DIAGNOSIS — Z79899 Other long term (current) drug therapy: Secondary | ICD-10-CM | POA: Diagnosis not present

## 2019-04-02 DIAGNOSIS — G629 Polyneuropathy, unspecified: Secondary | ICD-10-CM | POA: Insufficient documentation

## 2019-04-02 DIAGNOSIS — Z9981 Dependence on supplemental oxygen: Secondary | ICD-10-CM | POA: Insufficient documentation

## 2019-04-02 DIAGNOSIS — Z6841 Body Mass Index (BMI) 40.0 and over, adult: Secondary | ICD-10-CM

## 2019-04-02 DIAGNOSIS — Z86711 Personal history of pulmonary embolism: Secondary | ICD-10-CM | POA: Diagnosis not present

## 2019-04-02 DIAGNOSIS — E8582 Wild-type transthyretin-related (ATTR) amyloidosis: Secondary | ICD-10-CM | POA: Insufficient documentation

## 2019-04-02 DIAGNOSIS — Z7982 Long term (current) use of aspirin: Secondary | ICD-10-CM | POA: Diagnosis not present

## 2019-04-02 DIAGNOSIS — F17290 Nicotine dependence, other tobacco product, uncomplicated: Secondary | ICD-10-CM | POA: Diagnosis not present

## 2019-04-02 DIAGNOSIS — I5032 Chronic diastolic (congestive) heart failure: Secondary | ICD-10-CM | POA: Diagnosis not present

## 2019-04-02 NOTE — Progress Notes (Signed)
Paramedicine Initial Assessment:  Housing:  In what kind of housing do you live? House/apt/trailer/shelter? House but rents a room- reports maybe 3 other residents living there.  Do you rent/pay a mortgage/own? rent  Do you live with anyone? Roommates   Are you currently worried about losing your housing? no  Within the past 12 months have you ever stayed outside, in a car, tent, a shelter, or temporarily with someone? no  Within the past 12 months have you been unable to get utilities when it was really needed? no  Social:  What is your current marital status? divorced  Do you have any children? 2- closest lives in [place] the other in [place] you have family or friends who live locally? Sister Hassan Rowan who came to the appt with him  Food:  Within the past 12 months were you ever worried that food would run out before you got money to buy more? no  Within the past 77months have you run out of food and didn't have money to buy more? No  Gets food stamps and meals on wheels.  Income:  What is your current source of income? SSI  How hard is it for you to pay for the basics like food housing, medical care, and utilities? Not at all  Do you have outstanding medical bills? no  Insurance:  Are you currently insured? Yes- Medicaid  Do you have prescription coverage? yes   Transportation:  Do you have transportation to your medical appointments? Uses Medicaid transport to medical appts (through company called [employer]) and gets friends to help him get to the grocery store when needed.  Interested in [employer] and reports he has tried to complete it before but had trouble getting a physicians office to complete.  CSW completed with pt in office- submitted 04/02/19  In the past 12 months has lack of transportation kept you from medical appts or from getting medications? no  In the past 12 months has lack of transportation kept you from meetings, work, or getting things you needed? no   Daily Health Needs: Do you have a working scale at home? No- provided one in office  How do you manage your medications at home? Puts all his bottles in a line and takes them as the bottle states.  Do you ever take your medications differently than prescribed? Per APP patient has not been taking as instructed.  Do you have issues affording your medications? No- on Medicaid and $3-4 have been affordable.  Do you have any concerns with mobility at home? Some concerns about shower safety.  Hasn't fallen in the shower but is worried about it sometimes.  Interested in a 3in1 shower chair but will have to request from his PCP.  Do you use any assistive devices at home or have PCS at home? Uses a cane  Do you have a PCP? Has been seeing [facility] but doesn't like them- is working on transferring to [facility] with [facility].   Are there any additional barriers you see to getting the care you need?no  Additional info: pt has a case worker through [employer] [phone]  CSW will continue to follow through paramedicine program and assist as needed.

## 2019-04-02 NOTE — Patient Instructions (Addendum)
STOP Advil   Your physician recommends that you schedule a follow-up appointment in: 4 weeks with the Advanced Practice Practitioner on Tuesday, March 9th, 2021 at 10:30AM. [place] code [phone]   Please call office at [phone] option [phone] if you have any questions or concerns.   At the [facility], you and your health needs are our priority. As part of our continuing mission to provide you with exceptional heart care, we have created designated Provider Care Teams. These Care Teams include your primary Cardiologist (physician) and Advanced Practice Providers (APPs- Physician Assistants and Nurse Practitioners) who all work together to provide you with the care you need, when you need it.   You may see any of the following providers on your designated Care Team at your next follow up: Marland Kitchen Dr Glori Bickers . Dr Loralie Champagne . Darrick Grinder, NP . Lyda Jester, PA . Audry Riles, PharmD   Please be sure to bring in all your medications bottles to every appointment.
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
## 2019-04-03 ENCOUNTER — Telehealth (HOSPITAL_COMMUNITY): Payer: Self-pay

## 2019-04-03 NOTE — Telephone Encounter (Signed)
Spoke to [place] who agreed to initial home visit on 2/18 at 1100.

## 2019-04-03 NOTE — Telephone Encounter (Signed)
Attempted to contact [name] to schedule initial visit and no answer with VM box being full. Will continue to follow until speaking to patient.

## 2019-04-04 ENCOUNTER — Ambulatory Visit: Payer: Medicaid Other | Admitting: Physical Therapy

## 2019-04-04 ENCOUNTER — Other Ambulatory Visit: Payer: Self-pay

## 2019-04-04 ENCOUNTER — Encounter: Payer: Self-pay | Admitting: Physical Therapy

## 2019-04-04 DIAGNOSIS — G629 Polyneuropathy, unspecified: Secondary | ICD-10-CM

## 2019-04-04 DIAGNOSIS — M6281 Muscle weakness (generalized): Secondary | ICD-10-CM | POA: Diagnosis not present
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
## 2019-04-05 DIAGNOSIS — G5603 Carpal tunnel syndrome, bilateral upper limbs: Secondary | ICD-10-CM | POA: Diagnosis not present

## 2019-04-06 ENCOUNTER — Ambulatory Visit: Payer: Medicaid Other | Admitting: Physical Therapy

## 2019-04-06 ENCOUNTER — Other Ambulatory Visit: Payer: Self-pay

## 2019-04-06 ENCOUNTER — Encounter: Payer: Self-pay | Admitting: Physical Therapy

## 2019-04-06 DIAGNOSIS — M6281 Muscle weakness (generalized): Secondary | ICD-10-CM

## 2019-04-06 DIAGNOSIS — G629 Polyneuropathy, unspecified: Secondary | ICD-10-CM
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
## 2019-04-09 ENCOUNTER — Telehealth (HOSPITAL_COMMUNITY): Payer: Self-pay | Admitting: Licensed Clinical Social Worker

## 2019-04-09 MED FILL — VYNDAMAX 61 MG CAPS: 61 | 30 days supply | Qty: 30 | Fill #4
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
## 2019-04-10 ENCOUNTER — Telehealth (HOSPITAL_COMMUNITY): Payer: Self-pay | Admitting: Licensed Clinical Social Worker

## 2019-04-10 DIAGNOSIS — H02125 Mechanical ectropion of left lower eyelid: Secondary | ICD-10-CM | POA: Diagnosis not present

## 2019-04-10 DIAGNOSIS — Z7984 Long term (current) use of oral hypoglycemic drugs: Secondary | ICD-10-CM | POA: Diagnosis not present

## 2019-04-10 DIAGNOSIS — H5203 Hypermetropia, bilateral: Secondary | ICD-10-CM | POA: Diagnosis not present

## 2019-04-10 DIAGNOSIS — H52203 Unspecified astigmatism, bilateral: Secondary | ICD-10-CM | POA: Diagnosis not present

## 2019-04-10 DIAGNOSIS — H2513 Age-related nuclear cataract, bilateral: Secondary | ICD-10-CM | POA: Diagnosis not present

## 2019-04-10 DIAGNOSIS — E119 Type 2 diabetes mellitus without complications: Secondary | ICD-10-CM | POA: Diagnosis not present

## 2019-04-10 DIAGNOSIS — H524 Presbyopia: Secondary | ICD-10-CM | POA: Diagnosis not present

## 2019-04-10 DIAGNOSIS — H43393 Other vitreous opacities, bilateral: Secondary | ICD-10-CM | POA: Diagnosis not present

## 2019-04-10 DIAGNOSIS — Z8679 Personal history of other diseases of the circulatory system: Secondary | ICD-10-CM | POA: Diagnosis not present

## 2019-04-10 DIAGNOSIS — H53022 Refractive amblyopia, left eye: Secondary | ICD-10-CM | POA: Diagnosis not present

## 2019-04-10 DIAGNOSIS — Z83518 Family history of other specified eye disorder: Secondary | ICD-10-CM | POA: Diagnosis not present

## 2019-04-10 DIAGNOSIS — H16222 Keratoconjunctivitis sicca, not specified as Sjogren's, left eye: Secondary | ICD-10-CM | POA: Diagnosis not present

## 2019-04-10 NOTE — Telephone Encounter (Signed)
CSW attempted to call patients PT to discuss how pt can continue his PT sessions for longer- the PT isn't in today so had to leave a message.  CSW called pt to inform and he reports that he actually has a session with the PT tomorrow so CSW suggested he call me during his appt so we can all discuss together.  CSW will continue to follow and assist as needed  Jorge Ny, [facility] Desk#: [phone] Cell#: [phone]

## 2019-04-11 ENCOUNTER — Telehealth (HOSPITAL_COMMUNITY): Payer: Self-pay

## 2019-04-11 ENCOUNTER — Other Ambulatory Visit: Payer: Self-pay

## 2019-04-11 ENCOUNTER — Telehealth (HOSPITAL_COMMUNITY): Payer: Self-pay | Admitting: Licensed Clinical Social Worker

## 2019-04-11 ENCOUNTER — Encounter: Payer: Self-pay | Admitting: Physical Therapy

## 2019-04-11 ENCOUNTER — Ambulatory Visit: Payer: Medicaid Other | Admitting: Physical Therapy

## 2019-04-11 DIAGNOSIS — M6281 Muscle weakness (generalized): Secondary | ICD-10-CM

## 2019-04-11 DIAGNOSIS — G629 Polyneuropathy, unspecified: Secondary | ICD-10-CM
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
## 2019-04-11 NOTE — Telephone Encounter (Signed)
CSW received message from pt Physical Therapist informing CSW that they are working on getting the pt more sessions but that they are unsure if Medicaid will approve so that is what he had discussed with pt during session last week.  CSW called pt and updated- will continue to follow and assist as needed  Jorge Ny, [facility] Desk#: [phone] Cell#: [phone]

## 2019-04-11 NOTE — Telephone Encounter (Signed)
Spoke to Mr. Tennenbaum and he agreed to reschedule home visit to next Thursday 2/25.   WILL NEED LARGE PILL BOX

## 2019-04-13 ENCOUNTER — Ambulatory Visit: Payer: Medicaid Other | Admitting: Physical Therapy

## 2019-04-16 ENCOUNTER — Ambulatory Visit (INDEPENDENT_AMBULATORY_CARE_PROVIDER_SITE_OTHER): Payer: Medicaid Other | Admitting: Family Medicine

## 2019-04-16 ENCOUNTER — Other Ambulatory Visit: Payer: Self-pay

## 2019-04-16 ENCOUNTER — Encounter: Payer: Self-pay | Admitting: Family Medicine

## 2019-04-16 DIAGNOSIS — E119 Type 2 diabetes mellitus without complications: Secondary | ICD-10-CM | POA: Diagnosis not present

## 2019-04-16 DIAGNOSIS — M25561 Pain in right knee: Secondary | ICD-10-CM | POA: Diagnosis not present

## 2019-04-16 DIAGNOSIS — Z720 Tobacco use: Secondary | ICD-10-CM

## 2019-04-16 DIAGNOSIS — G8929 Other chronic pain: Secondary | ICD-10-CM | POA: Insufficient documentation

## 2019-04-16 DIAGNOSIS — M25562 Pain in left knee: Secondary | ICD-10-CM

## 2019-04-16 LAB — POCT GLYCOSYLATED HEMOGLOBIN (HGB A1C): Hemoglobin A1C: 7 % — AB (ref 4.0–5.6)

## 2019-04-16 NOTE — Assessment & Plan Note (Signed)
Patient currently smoking.  He reports a 20-pack-year history.  He says that he is ready to quit smoking and actually started on Chantix last year.  He says that when he went to the emergency department for his heart issues which he eventually found out that he was diagnosed with cardiac amyloidosis he was told to stop taking the Chantix because of his heart issues. -Counseled on the benefits of smoking cessation -Would like to restart Chantix -We will discuss with patient at next visit -We will refer this patient to Dr. [name] for smoking cessation counseling

## 2019-04-16 NOTE — Assessment & Plan Note (Signed)
Patient reports history of knee pain for "many years.  He reports left knee orthoscopic knee around 2004 and 2006.  He reports right knee arthroscopy around 2003.  Patient reports several knee injections after that time and said that he benefited greatly from them.  He reports no recent knee injections. -Physical exam shows pain with flexion and extension.  No crepitus noted. -Patient scheduled for injections on Friday 1/26

## 2019-04-16 NOTE — Patient Instructions (Addendum)
It was a pleasure to meet you today.  I am sorry you are having these issues with your knees.  I have scheduled you for an appointment on 04/20/2019 at 1125 to get injections in your knees.  With regards to your new diagnosis of diabetes I am not going to start you on any medications right now but would like you to try and make changes to your diet to decrease your blood glucose levels.  I will attach information regarding what steps she can take.  I would like to see you in 3 months for a repeat hemoglobin A1c.  Regarding your smoking cessation, I am going to send your chart to our pharmacist who will help manage this and help you quit smoking.  I do not believe there should be any issues with you taking Chantix but I want to make sure is okay with him.  He will be in contact to discuss this.  If you have any questions or concerns please feel free to call the clinic we will be more than willing to help you.   Diabetes Mellitus and Nutrition, Adult When you have diabetes (diabetes mellitus), it is very important to have healthy eating habits because your blood sugar (glucose) levels are greatly affected by what you eat and drink. Eating healthy foods in the appropriate amounts, at about the same times every day, can help you:  Control your blood glucose.  Lower your risk of heart disease.  Improve your blood pressure.  Reach or maintain a healthy weight. Every person with diabetes is different, and each person has different needs for a meal plan. Your health care provider may recommend that you work with a diet and nutrition specialist (dietitian) to make a meal plan that is best for you. Your meal plan may vary depending on factors such as:  The calories you need.  The medicines you take.  Your weight.  Your blood glucose, blood pressure, and cholesterol levels.  Your activity level.  Other health conditions you have, such as heart or kidney disease. How do carbohydrates affect  me? Carbohydrates, also called carbs, affect your blood glucose level more than any other type of food. Eating carbs naturally raises the amount of glucose in your blood. Carb counting is a method for keeping track of how many carbs you eat. Counting carbs is important to keep your blood glucose at a healthy level, especially if you use insulin or take certain oral diabetes medicines. It is important to know how many carbs you can safely have in each meal. This is different for every person. Your dietitian can help you calculate how many carbs you should have at each meal and for each snack. Foods that contain carbs include:  Bread, cereal, rice, pasta, and crackers.  Potatoes and corn.  Peas, beans, and lentils.  Milk and yogurt.  Fruit and juice.  Desserts, such as cakes, cookies, ice cream, and candy. How does alcohol affect me? Alcohol can cause a sudden decrease in blood glucose (hypoglycemia), especially if you use insulin or take certain oral diabetes medicines. Hypoglycemia can be a life-threatening condition. Symptoms of hypoglycemia (sleepiness, dizziness, and confusion) are similar to symptoms of having too much alcohol. If your health care provider says that alcohol is safe for you, follow these guidelines:  Limit alcohol intake to no more than 1 drink per day for nonpregnant women and 2 drinks per day for men. One drink equals 12 oz of beer, 5 oz of wine, or  1 oz of hard liquor.  Do not drink on an empty stomach.  Keep yourself hydrated with water, diet soda, or unsweetened iced tea.  Keep in mind that regular soda, juice, and other mixers may contain a lot of sugar and must be counted as carbs. What are tips for following this plan?  Reading food labels  Start by checking the serving size on the "Nutrition Facts" label of packaged foods and drinks. The amount of calories, carbs, fats, and other nutrients listed on the label is based on one serving of the item. Many items  contain more than one serving per package.  Check the total grams (g) of carbs in one serving. You can calculate the number of servings of carbs in one serving by dividing the total carbs by 15. For example, if a food has 30 g of total carbs, it would be equal to 2 servings of carbs.  Check the number of grams (g) of saturated and trans fats in one serving. Choose foods that have low or no amount of these fats.  Check the number of milligrams (mg) of salt (sodium) in one serving. Most people should limit total sodium intake to less than 2,300 mg per day.  Always check the nutrition information of foods labeled as "low-fat" or "nonfat". These foods may be higher in added sugar or refined carbs and should be avoided.  Talk to your dietitian to identify your daily goals for nutrients listed on the label. Shopping  Avoid buying canned, premade, or processed foods. These foods tend to be high in fat, sodium, and added sugar.  Shop around the outside edge of the grocery store. This includes fresh fruits and vegetables, bulk grains, fresh meats, and fresh dairy. Cooking  Use low-heat cooking methods, such as baking, instead of high-heat cooking methods like deep frying.  Cook using healthy oils, such as olive, canola, or sunflower oil.  Avoid cooking with butter, cream, or high-fat meats. Meal planning  Eat meals and snacks regularly, preferably at the same times every day. Avoid going long periods of time without eating.  Eat foods high in fiber, such as fresh fruits, vegetables, beans, and whole grains. Talk to your dietitian about how many servings of carbs you can eat at each meal.  Eat 4-6 ounces (oz) of lean protein each day, such as lean meat, chicken, fish, eggs, or tofu. One oz of lean protein is equal to: ? 1 oz of meat, chicken, or fish. ? 1 egg. ?  cup of tofu.  Eat some foods each day that contain healthy fats, such as avocado, nuts, seeds, and fish. Lifestyle  Check your  blood glucose regularly.  Exercise regularly as told by your health care provider. This may include: ? 150 minutes of moderate-intensity or vigorous-intensity exercise each week. This could be brisk walking, biking, or water aerobics. ? Stretching and doing strength exercises, such as yoga or weightlifting, at least 2 times a week.  Take medicines as told by your health care provider.  Do not use any products that contain nicotine or tobacco, such as cigarettes and e-cigarettes. If you need help quitting, ask your health care provider.  Work with a Social worker or diabetes educator to identify strategies to manage stress and any emotional and social challenges. Questions to ask a health care provider  Do I need to meet with a diabetes educator?  Do I need to meet with a dietitian?  What number can I call if I have questions?  When are the best times to check my blood glucose? Where to find more information:  American Diabetes Association: diabetes.org  Academy of Nutrition and Dietetics: www.eatright.[employer] of Diabetes and Digestive and Kidney Diseases (NIH): [employer] Summary  A healthy meal plan will help you control your blood glucose and maintain a healthy lifestyle.  Working with a diet and nutrition specialist (dietitian) can help you make a meal plan that is best for you.  Keep in mind that carbohydrates (carbs) and alcohol have immediate effects on your blood glucose levels. It is important to count carbs and to use alcohol carefully. This information is not intended to replace advice given to you by your health care provider. Make sure you discuss any questions you have with your health care provider. Document Revised: 01/21/2017 Document Reviewed: 03/15/2016 Elsevier Patient Education  2020 [employer].

## 2019-04-16 NOTE — Assessment & Plan Note (Signed)
Patient's last hemoglobin A1c was 6.1.  Hemoglobin A1c today was 7.0.  Diabetes management discussed with patient.  Patient reports that he was given Metformin in the past and he felt "terrible" while taking it.  He refuses to take it at this time.  Patient reports he would like to try dietary management before starting any medications. -Patient counseled on necessary dietary changes and given handout regarding proper dietary management of diabetes -Consider medication management at next visit -Diabetes follow-up in 3 months
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
## 2019-04-19 ENCOUNTER — Other Ambulatory Visit (HOSPITAL_COMMUNITY): Payer: Self-pay

## 2019-04-19 NOTE — Progress Notes (Signed)
Paramedicine Encounter    Patient ID: Russell Collier, male    DOB: 1960/07/21, 59 y.o.   MRN: [id number]   Patient Care Team: [facility] [place] as PCP - General [name], MD as PCP - Cardiology (Cardiology) Michael Boston, MD as Consulting Physician (General Surgery) Wilford Corner, MD as Consulting Physician (Gastroenterology) Jorge Ny, LCSW as Social Worker (Licensed Clinical Social Worker)  Patient Active Problem List   Diagnosis Date Noted  . Chronic knee pain 04/16/2019  . Diabetes ([place]) 04/16/2019  . Esophageal reflux 02/06/2019  . Heartburn 02/06/2019  . Chronic skin ulcer of lower leg ([place]) 12/21/2018  . S/P drug eluting coronary stent placement   . Coronary artery disease involving native coronary artery of native heart with unstable angina pectoris ([place]) 12/20/2018  . Unstable angina ([place])   . 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 Acute respiratory distress 10/16/2018  . Acute on chronic diastolic heart failure ([place])   . Shortness of breath   . Precordial chest pain   . Idiopathic peripheral neuropathy   . Palpitations   . Chronic diastolic heart failure ([place])   . Abnormal ankle brachial index (ABI)   . Chest pain, rule out acute myocardial infarction 09/26/2018  . Dyspepsia   . Morbid obesity ([place])   . OSA on CPAP   . Chest pain 05/30/2018  . CAD (coronary artery disease) 03/30/2018  . Elevated troponin 02/03/2018  . Positive urine drug screen 02/03/2018  . Tobacco abuse 02/03/2018  . Hyperlipidemia 02/03/2018  . Acute respiratory failure with hypoxia ([place]) 02/02/2018  . Acute congestive heart failure ([place])   . Acute bronchitis 12/28/2015  . Atypical chest pain 12/27/2015  . Essential hypertension   . Neuropathy   . Viral illness     Current Outpatient Medications:  .  albuterol (PROVENTIL) (2.5 MG/3ML) 0.083% nebulizer solution, Take 2.5 mg by nebulization every 6 (six) hours as needed for wheezing or shortness of breath., Disp: , Rfl:  .   albuterol (VENTOLIN HFA) 108 (90 Base) MCG/ACT inhaler, Inhale 2 puffs into the lungs every 6 (six) hours as needed for wheezing or shortness of breath., Disp: , Rfl:  .  atorvastatin (LIPITOR) 80 MG tablet, Take 1 tablet (80 mg total) by mouth daily., Disp: 90 tablet, Rfl: 3 .  clopidogrel (PLAVIX) 75 MG tablet, Take 1 tablet (75 mg total) by mouth daily with breakfast., Disp: 90 tablet, Rfl: 3 .  enalapril (VASOTEC) 20 MG tablet, Take 20 mg by mouth 2 (two) times daily., Disp: , Rfl:  .  fluticasone (FLONASE) 50 MCG/ACT nasal spray, Place 1 spray into both nostrils daily., Disp: 18.2 mL, Rfl: 2 .  furosemide (LASIX) 40 MG tablet, Take 1 tablet (40 mg total) by mouth 2 (two) times daily., Disp: 180 tablet, Rfl: 3 .  Ibuprofen (ADVIL) 200 MG CAPS, Take 800 mg by mouth 3 (three) times daily., Disp: , Rfl:  .  isosorbide mononitrate (IMDUR) 30 MG 24 hr tablet, Take 1 tablet (30 mg total) by mouth daily., Disp: 30 tablet, Rfl: 0 .  methocarbamol (ROBAXIN) 500 MG tablet, Take 1 tablet (500 mg total) by mouth every 8 (eight) hours as needed for muscle spasms., Disp: 15 tablet, Rfl: 0 .  Multiple Vitamin (MULTIVITAMIN WITH MINERALS) TABS tablet, Take 1 tablet by mouth daily., Disp: , Rfl:  .  pantoprazole (PROTONIX) 40 MG tablet, Take 1 tablet (40 mg total) by mouth 2 (two) times daily., Disp: 60 tablet, Rfl: 0 .  potassium chloride SA (  KLOR-CON) 20 MEQ tablet, Take 1 tablet (20 mEq total) by mouth daily., Disp: 90 tablet, Rfl: 1 .  spironolactone (ALDACTONE) 25 MG tablet, Take 0.5 tablets (12.5 mg total) by mouth daily., Disp: 45 tablet, Rfl: 3 .  Tafamidis (VYNDAMAX) 61 MG CAPS, Take 61 mg by mouth daily., Disp: , Rfl:  .  valACYclovir (VALTREX) 500 MG tablet, Take 500 mg by mouth as needed. , Disp: , Rfl:  .  aspirin 81 MG chewable tablet, Chew 1 tablet (81 mg total) by mouth daily., Disp: 90 tablet, Rfl: 3 .  pregabalin (LYRICA) 100 MG capsule, Take 1 tablet at bedtime x 1 week, then increase 1 tablet  twice daily (Patient not taking: Reported on 04/19/2019), Disp: 60 capsule, Rfl: 3 No Known Allergies   Social History   Socioeconomic History  . Marital status: Divorced    Spouse name: Not on file  . Number of children: Not on file  . Years of education: Not on file  . Highest education level: Not on file  Occupational History  . Not on file  Tobacco Use  . Smoking status: Current Every Day Smoker    Packs/day: 0.50    Years: 54.00    Pack years: 27.00    Types: Cigarettes, Cigars  . Smokeless tobacco: Never Used  Substance and Sexual Activity  . Alcohol use: Yes    Alcohol/week: 1.0 standard drinks    Types: 1 Cans of beer per week  . Drug use: Yes    Comment: last use may last year  . Sexual activity: Yes  Other Topics Concern  . Not on file  Social History Narrative  . Not on file   Social Determinants of Health   Financial Resource Strain: Low Risk   . Difficulty of Paying Living Expenses: Not very hard  Food Insecurity: No Food Insecurity  . Worried About Charity fundraiser in the Last Year: Never true  . Ran Out of Food in the Last Year: Never true  Transportation Needs: No Transportation Needs  . Lack of Transportation (Medical): No  . Lack of Transportation (Non-Medical): No  Physical Activity: Insufficiently Active  . Days of Exercise per Week: 2 days  . Minutes of Exercise per Session: 20 min  Stress: No Stress Concern Present  . Feeling of Stress : Not at all  Social Connections: Somewhat Isolated  . Frequency of Communication with Friends and Family: More than three times a week  . Frequency of Social Gatherings with Friends and Family: Twice a week  . Attends Religious Services: 1 to 4 times per year  . Active Member of Clubs or Organizations: No  . Attends Archivist Meetings: Never  . Marital Status: Divorced  Human resources officer Violence: Not At Risk  . Fear of Current or Ex-Partner: No  . Emotionally Abused: No  . Physically Abused:  No  . Sexually Abused: No    Physical Exam Vitals reviewed.  HENT:     Head: Normocephalic.     Nose: Nose normal.     Mouth/Throat:     Mouth: Mucous membranes are moist.  Eyes:     Pupils: Pupils are equal, round, and reactive to light.  Cardiovascular:     Rate and Rhythm: Normal rate and regular rhythm.     Pulses: Normal pulses.     Heart sounds: Normal heart sounds.  Pulmonary:     Effort: Pulmonary effort is normal.  Abdominal:     Palpations: Abdomen is  soft.  Musculoskeletal:        General: Normal range of motion.     Cervical back: Normal range of motion.  Skin:    General: Skin is warm and dry.     Capillary Refill: Capillary refill takes less than 2 seconds.  Neurological:     Mental Status: He is alert. Mental status is at baseline.  Psychiatric:        Mood and Affect: Mood normal.     Arrived for home visit for Russell Collier who greeted me at front door appearing short of breath after ambulating down stairs to door. Patient ambulated back up stairs and seated on chair at kitchen table. After a few moments of being seated he caught his breath. Patient weighed 317lbs today. Russell Collier reports feeling "tired" a lot and fatigued. Patient stated he is receiving physical therapy and seems to be doing well with same. He reports seeing a PCP Russell Collier for first visit this week and plans to maintain primary care with same. Medications reviewed and verified. Pill box filled accordingly. Patient reports eating chicken pot pie from meals on wheels today and having hamburgers tonight for dinner. I educated on heart healthy options patient verbalized understanding. Vitals as noted. I will follow patient weekly until otherwise noted. Patient agreed with same.   Refills:  Atorvastatin  Isosorbide Pregabalin    Weight today- 317lbs  Weight last- 329lbs   CBG- 132     Future Appointments  Date Time Provider [place]  04/20/2019 11:25 AM [name], MD  [facility]  04/26/2019 11:00 AM Herma Carson, PT [facility]  04/27/2019 11:00 AM Herma Carson, PT [facility]  04/30/2019  1:30 PM Herma Carson, PT [facility]  05/01/2019 10:30 AM [facility] PA/NP [facility] None  05/03/2019  1:30 PM Herma Carson, PT [facility]  05/09/2019 11:00 AM Herma Carson, PT [facility]  05/11/2019 11:00 AM Herma Carson, PT [facility]  05/14/2019 11:00 AM Herma Carson, PT [facility]  05/16/2019 11:00 AM Herma Carson, PT [facility]  06/11/2019 10:50 AM Patel, Arvin Collard K, DO [facility] None     ACTION: Home visit completed Next visit planned for one week

## 2019-04-20 ENCOUNTER — Ambulatory Visit (INDEPENDENT_AMBULATORY_CARE_PROVIDER_SITE_OTHER): Payer: Medicaid Other | Admitting: Family Medicine

## 2019-04-20 ENCOUNTER — Other Ambulatory Visit: Payer: Self-pay

## 2019-04-20 VITALS — BP 118/75 | HR 89 | Wt 324.0 lb

## 2019-04-20 DIAGNOSIS — G8929 Other chronic pain: Secondary | ICD-10-CM

## 2019-04-20 DIAGNOSIS — M25561 Pain in right knee: Secondary | ICD-10-CM | POA: Diagnosis not present

## 2019-04-20 DIAGNOSIS — M25562 Pain in left knee: Secondary | ICD-10-CM | POA: Diagnosis not present

## 2019-04-20 MED ORDER — PREGABALIN 100 MG PO CAPS: ORAL_CAPSULE | ORAL | 3 refills | Status: DC

## 2019-04-20 MED ORDER — METHYLPREDNISOLONE ACETATE 40 MG/ML IJ SUSP
40.0000 mg | Freq: Once | INTRAMUSCULAR | Status: AC
  Administered 2019-04-20: 40 mg via INTRAMUSCULAR

## 2019-04-20 MED ORDER — ATORVASTATIN CALCIUM 80 MG PO TABS: 80.0000 mg | ORAL_TABLET | Freq: Every day | ORAL | 3 refills | Status: DC

## 2019-04-20 MED ORDER — CLOPIDOGREL BISULFATE 75 MG PO TABS: 75.0000 mg | ORAL_TABLET | Freq: Every day | ORAL | 3 refills | Status: DC

## 2019-04-20 NOTE — Patient Instructions (Signed)
It was great to see you today.  I went ahead and sent your prescription refills to your pharmacy.  We also did a knee injection today.  The knee pain should be better for the rest of the day but may come back or be worse tonight and into tomorrow.  Over the next 1 to 2 days the pain should decrease.  I have attached more information regarding knee injections.  Have a wonderful afternoon!   Knee Injection A knee injection is a procedure to get medicine into your knee joint to relieve the pain, swelling, and stiffness of arthritis. Your health care provider uses a needle to inject medicine, which may also help to lubricate and cushion your knee joint. You may need more than one injection. Tell a health care provider about:  Any allergies you have.  All medicines you are taking, including vitamins, herbs, eye drops, creams, and over-the-counter medicines.  Any problems you or family members have had with anesthetic medicines.  Any blood disorders you have.  Any surgeries you have had.  Any medical conditions you have.  Whether you are pregnant or may be pregnant. What are the risks? Generally, this is a safe procedure. However, problems may occur, including:  Infection.  Bleeding.  Symptoms that get worse.  Damage to the area around your knee.  Allergic reaction to any of the medicines.  Skin reactions from repeated injections. What happens before the procedure?  Ask your health care provider about changing or stopping your regular medicines. This is especially important if you are taking diabetes medicines or blood thinners.  Plan to have someone take you home from the hospital or clinic. What happens during the procedure?   You will sit or lie down in a position for your knee to be treated.  The skin over your kneecap will be cleaned with a germ-killing soap.  You will be given a medicine that numbs the area (local anesthetic). You may feel some stinging.  The medicine  will be injected into your knee. The needle is carefully placed between your kneecap and your knee. The medicine is injected into the joint space.  The needle will be removed at the end of the procedure.  A bandage (dressing) may be placed over the injection site. The procedure may vary among health care providers and hospitals. What can I expect after the procedure?  Your blood pressure, heart rate, breathing rate, and blood oxygen level will be monitored until you leave the hospital or clinic.  You may have to move your knee through its full range of motion. This helps to get all the medicine into your joint space.  You will be watched to make sure that you do not have a reaction to the injected medicine.  You may feel more pain, swelling, and warmth than you did before the injection. This reaction may last about 1-2 days. Follow these instructions at home: Medicines  Take over-the-counter and prescription medicines only as told by your doctor.  Do not drive or use heavy machinery while taking prescription pain medicine.  Do not take medicines such as aspirin and ibuprofen unless your health care provider tells you to take them. Injection site care  Follow instructions from your health care provider about: ? How to take care of your puncture site. ? When and how you should change your dressing. ? When you should remove your dressing.  Check your injection area every day for signs of infection. Check for: ? More redness,  swelling, or pain after 2 days. ? Fluid or blood. ? Pus or a bad smell. ? Warmth. Managing pain, stiffness, and swelling   If directed, put ice on the injection area: ? Put ice in a plastic bag. ? Place a towel between your skin and the bag. ? Leave the ice on for 20 minutes, 2-3 times per day.  Do not apply heat to your knee.  Raise (elevate) the injection area above the level of your heart while you are sitting or lying down. General  instructions  If you were given a dressing, keep it dry until your health care provider says it can be removed. Ask your health care provider when you can start showering or taking a bath.  Avoid strenuous activities for as long as directed by your health care provider. Ask your health care provider when you can return to your normal activities.  Keep all follow-up visits as told by your health care provider. This is important. You may need more injections. Contact a health care provider if you have:  A fever.  Warmth in your injection area.  Fluid, blood, or pus coming from your injection site.  Symptoms at your injection site that last longer than 2 days after your procedure. Get help right away if:  Your knee: ? Turns very red. ? Becomes very swollen. ? Is in severe pain. Summary  A knee injection is a procedure to get medicine into your knee joint to relieve the pain, swelling, and stiffness of arthritis.  A needle is carefully placed between your kneecap and your knee to inject medicine into the joint space.  Before the procedure, ask your health care provider about changing or stopping your regular medicines, especially if you are taking diabetes medicines or blood thinners.  Contact your health care provider if you have any problems or questions after your procedure. This information is not intended to replace advice given to you by your health care provider. Make sure you discuss any questions you have with your health care provider. Document Revised: 02/28/2017 Document Reviewed: 02/28/2017 Elsevier Patient Education  [place].

## 2019-04-20 NOTE — Assessment & Plan Note (Signed)
Patient here after being initially evaluated on Monday of this week.  He is requesting a knee injection.  He says that it has been many years last knee injection.  Patient will only be getting 1 knee injection today and that will be on his right knee per his request. -Encouraged weight loss better glucose management -Encouraged exercising and stretching -40 mg Depo-Medrol injected into right knee with 2 mL lidocaine

## 2019-04-20 NOTE — Progress Notes (Signed)
    SUBJECTIVE:   CHIEF COMPLAINT / HPI:  Knee pain  Patient reports today for injections in his knee.  He was seen earlier this week and evaluated as a new patient.  Patient reports history of knee pain for "many years".  He has had multiple laparoscopic knee surgeries on his left knee and has also had one on his right knee.  Patient has had injections in the past and says that he benefits from them but  that it has been years left knee injection.  I discussed hemoglobin A1c with patient and tell patient unwilling to give him one knee injection today.  Patient would prefer getting his right knee injected today but that is the one that is bothering him the most at this time.   OBJECTIVE:   BP 118/75   Pulse 89   Wt (!) 324 lb (147 kg)   SpO2 97%   BMI 45.19 kg/m   General: Well-appearing, no acute distress, sitting comfortably in chair MSK: Patient complaining of right knee pain with tenderness to palpation along medial and lateral joint lines.  Pain is worse on the medial joint line.  No effusion noted.  After consent was obtained, using sterile technique the knee was marked and cleaned.  Steroid 4 mg Depo-Medrol and 2 ml plain Lidocaine was then injected and the needle withdrawn.  The procedure was well tolerated.  The patient is asked to continue to rest the knee for a few more days before resuming regular activities.  It may be more painful for the first 1-2 days.  Watch for fever, or increased swelling or persistent pain in knee. Call or return to clinic prn if such symptoms occur or the knee fails to improve as anticipated.   ASSESSMENT/PLAN:   Chronic knee pain Patient here after being initially evaluated on Monday of this week.  He is requesting a knee injection.  He says that it has been many years last knee injection.  Patient will only be getting 1 knee injection today and that will be on his right knee per his request. -Encouraged weight loss better glucose  management -Encouraged exercising and stretching -40 mg Depo-Medrol injected into right knee with 2 mL lidocaine   [name], MD [place]

## 2019-04-26 ENCOUNTER — Ambulatory Visit: Payer: Medicaid Other | Attending: Internal Medicine | Admitting: Physical Therapy

## 2019-04-26 ENCOUNTER — Encounter: Payer: Self-pay | Admitting: Physical Therapy

## 2019-04-26 ENCOUNTER — Telehealth: Payer: Self-pay | Admitting: Pharmacist

## 2019-04-26 ENCOUNTER — Other Ambulatory Visit (HOSPITAL_COMMUNITY): Payer: Self-pay

## 2019-04-26 ENCOUNTER — Other Ambulatory Visit: Payer: Self-pay

## 2019-04-26 DIAGNOSIS — G5603 Carpal tunnel syndrome, bilateral upper limbs: Secondary | ICD-10-CM | POA: Diagnosis not present

## 2019-04-26 DIAGNOSIS — M6281 Muscle weakness (generalized): Secondary | ICD-10-CM | POA: Diagnosis not present

## 2019-04-26 DIAGNOSIS — G629 Polyneuropathy, unspecified: Secondary | ICD-10-CM | POA: Diagnosis not present

## 2019-04-26 DIAGNOSIS — Z720 Tobacco use: Secondary | ICD-10-CM

## 2019-04-26 MED ORDER — NICOTINE 14 MG/24HR TD PT24: 14.0000 mg | MEDICATED_PATCH | Freq: Every day | TRANSDERMAL | 3 refills | Status: DC

## 2019-04-26 NOTE — Assessment & Plan Note (Signed)
Contacted Patient RE tobacco cessation.  Patient states he is currently interested in working on quitting "cigars".  (He smokes small cigarillos ~ 10 per day).     He used varenicline for ~ 1 week then had chest pain at the point when he increased the dose from 0.5mg  BID to 1mg  BID.     He also used nicotine patches while in the hospital in the past with success.   We agreed that using (restarting) patches would be his preference to start at this time.   I agreed to send new Rx to his pharmacy.   He currently smokes ~ 10 small cigars (cigarette size) per day.   I initiated 14mg  patches.  Plan phone follow-up in 1 week.
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
## 2019-04-26 NOTE — Telephone Encounter (Signed)
Contacted Patient RE tobacco cessation.  Patient states he is currently interested in working on quitting "cigars".  (He smokes small cigarillos ~ 10 per day).     He used varenicline for ~ 1 week then had chest pain at the point when he increased the dose from 0.5mg  BID to 1mg  BID.     He also used nicotine patches while in the hospital in the past with success.   We agreed that using (restarting) patches would be his preference to start at this time.   I agreed to send new Rx to his pharmacy.   He currently smokes ~ 10 small cigars (cigarette size) per day.   I initiated 14mg  patches.  Plan phone follow-up in 1 week.

## 2019-04-26 NOTE — Telephone Encounter (Signed)
Reviewed and agree.

## 2019-04-26 NOTE — Progress Notes (Signed)
Paramedicine Encounter    Patient ID: Russell Collier, male    DOB: [birth year], 59 y.o.   MRN: [id number]   Patient Care Team: [name], MD as PCP - General (Family Medicine) [name], MD as PCP - Cardiology (Cardiology) Michael Boston, MD as Consulting Physician (General Surgery) Wilford Corner, MD as Consulting Physician (Gastroenterology) Jorge Ny, LCSW as Social Worker (Licensed Clinical Social Worker)  Patient Active Problem List   Diagnosis Date Noted  . Chronic knee pain 04/16/2019  . Diabetes ([place]) 04/16/2019  . Esophageal reflux 02/06/2019  . Heartburn 02/06/2019  . Chronic skin ulcer of lower leg ([place]) 12/21/2018  . S/P drug eluting coronary stent placement   . Coronary artery disease involving native coronary artery of native heart with unstable angina pectoris ([place]) 12/20/2018  . Unstable angina ([place])   . 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 Acute respiratory distress 10/16/2018  . Acute on chronic diastolic heart failure ([place])   . Shortness of breath   . Precordial chest pain   . Idiopathic peripheral neuropathy   . Palpitations   . Chronic diastolic heart failure ([place])   . Abnormal ankle brachial index (ABI)   . Chest pain, rule out acute myocardial infarction 09/26/2018  . Dyspepsia   . Morbid obesity ([place])   . OSA on CPAP   . Chest pain 05/30/2018  . CAD (coronary artery disease) 03/30/2018  . Elevated troponin 02/03/2018  . Positive urine drug screen 02/03/2018  . Tobacco abuse 02/03/2018  . Hyperlipidemia 02/03/2018  . Acute respiratory failure with hypoxia ([place]) 02/02/2018  . Acute congestive heart failure ([place])   . Acute bronchitis 12/28/2015  . Atypical chest pain 12/27/2015  . Essential hypertension   . Neuropathy   . Viral illness     Current Outpatient Medications:  .  albuterol (PROVENTIL) (2.5 MG/3ML) 0.083% nebulizer solution, Take 2.5 mg by nebulization every 6 (six) hours as needed for wheezing or shortness of breath., Disp:  , Rfl:  .  albuterol (VENTOLIN HFA) 108 (90 Base) MCG/ACT inhaler, Inhale 2 puffs into the lungs every 6 (six) hours as needed for wheezing or shortness of breath., Disp: , Rfl:  .  atorvastatin (LIPITOR) 80 MG tablet, Take 1 tablet (80 mg total) by mouth daily., Disp: 90 tablet, Rfl: 3 .  clopidogrel (PLAVIX) 75 MG tablet, Take 1 tablet (75 mg total) by mouth daily with breakfast., Disp: 90 tablet, Rfl: 3 .  enalapril (VASOTEC) 20 MG tablet, Take 20 mg by mouth 2 (two) times daily., Disp: , Rfl:  .  fluticasone (FLONASE) 50 MCG/ACT nasal spray, Place 1 spray into both nostrils daily., Disp: 18.2 mL, Rfl: 2 .  furosemide (LASIX) 40 MG tablet, Take 1 tablet (40 mg total) by mouth 2 (two) times daily., Disp: 180 tablet, Rfl: 3 .  Ibuprofen (ADVIL) 200 MG CAPS, Take 800 mg by mouth 3 (three) times daily., Disp: , Rfl:  .  isosorbide mononitrate (IMDUR) 30 MG 24 hr tablet, Take 1 tablet (30 mg total) by mouth daily., Disp: 30 tablet, Rfl: 0 .  methocarbamol (ROBAXIN) 500 MG tablet, Take 1 tablet (500 mg total) by mouth every 8 (eight) hours as needed for muscle spasms., Disp: 15 tablet, Rfl: 0 .  Multiple Vitamin (MULTIVITAMIN WITH MINERALS) TABS tablet, Take 1 tablet by mouth daily., Disp: , Rfl:  .  pantoprazole (PROTONIX) 40 MG tablet, Take 1 tablet (40 mg total) by mouth 2 (two) times daily., Disp: 60 tablet, Rfl: 0 .  potassium  chloride SA (KLOR-CON) 20 MEQ tablet, Take 1 tablet (20 mEq total) by mouth daily., Disp: 90 tablet, Rfl: 1 .  pregabalin (LYRICA) 100 MG capsule, Take 1 tablet twice daily, Disp: 60 capsule, Rfl: 3 .  spironolactone (ALDACTONE) 25 MG tablet, Take 0.5 tablets (12.5 mg total) by mouth daily., Disp: 45 tablet, Rfl: 3 .  Tafamidis (VYNDAMAX) 61 MG CAPS, Take 61 mg by mouth daily., Disp: , Rfl:  .  aspirin 81 MG chewable tablet, Chew 1 tablet (81 mg total) by mouth daily. (Patient not taking: Reported on 04/26/2019), Disp: 90 tablet, Rfl: 3 .  nicotine (NICOTINE STEP 2) 14 mg/24hr  patch, Place 1 patch (14 mg total) onto the skin daily., Disp: 28 patch, Rfl: 3 .  valACYclovir (VALTREX) 500 MG tablet, Take 500 mg by mouth as needed. , Disp: , Rfl:  No Known Allergies   Social History   Socioeconomic History  . Marital status: Divorced    Spouse name: Not on file  . Number of children: Not on file  . Years of education: Not on file  . Highest education level: Not on file  Occupational History  . Not on file  Tobacco Use  . Smoking status: Current Every Day Smoker    Packs/day: 0.50    Years: 54.00    Pack years: 27.00    Types: Cigarettes, Cigars  . Smokeless tobacco: Never Used  Substance and Sexual Activity  . Alcohol use: Yes    Alcohol/week: 1.0 standard drinks    Types: 1 Cans of beer per week  . Drug use: Yes    Comment: last use may last year  . Sexual activity: Yes  Other Topics Concern  . Not on file  Social History Narrative  . Not on file   Social Determinants of Health   Financial Resource Strain: Low Risk   . Difficulty of Paying Living Expenses: Not very hard  Food Insecurity: No Food Insecurity  . Worried About Charity fundraiser in the Last Year: Never true  . Ran Out of Food in the Last Year: Never true  Transportation Needs: No Transportation Needs  . Lack of Transportation (Medical): No  . Lack of Transportation (Non-Medical): No  Physical Activity: Insufficiently Active  . Days of Exercise per Week: 2 days  . Minutes of Exercise per Session: 20 min  Stress: No Stress Concern Present  . Feeling of Stress : Not at all  Social Connections: Somewhat Isolated  . Frequency of Communication with Friends and Family: More than three times a week  . Frequency of Social Gatherings with Friends and Family: Twice a week  . Attends Religious Services: 1 to 4 times per year  . Active Member of Clubs or Organizations: No  . Attends Archivist Meetings: Never  . Marital Status: Divorced  Human resources officer Violence: Not At Risk   . Fear of Current or Ex-Partner: No  . Emotionally Abused: No  . Physically Abused: No  . Sexually Abused: No    Physical Exam Vitals reviewed.  HENT:     Head: Normocephalic.     Nose: Nose normal.     Mouth/Throat:     Mouth: Mucous membranes are moist.  Eyes:     Pupils: Pupils are equal, round, and reactive to light.  Cardiovascular:     Rate and Rhythm: Normal rate and regular rhythm.     Pulses: Normal pulses.     Heart sounds: Normal heart sounds.  Pulmonary:  Effort: Pulmonary effort is normal.     Breath sounds: Normal breath sounds.  Abdominal:     Palpations: Abdomen is soft.  Musculoskeletal:        General: Normal range of motion.     Cervical back: Normal range of motion.     Right lower leg: No edema.     Left lower leg: No edema.  Skin:    General: Skin is warm and dry.     Capillary Refill: Capillary refill takes less than 2 seconds.     Comments: "3 scabs from small round open wounds on back"   Neurological:     Mental Status: He is alert. Mental status is at baseline.  Psychiatric:        Mood and Affect: Mood normal.    Arrived for home visit for Mr. Moscoso who was arriving home from physical therapy. Mr. Guntrum walked in his home with minimal shortness of breath walking up numerous stairs. Medications were reviewed and confirmed. Pill box filled accordingly. Vitals obtained and are as noted. Mr. Tricarico was made aware of his appointment next week Tuesday at 1030. He also is requesting a call to his PCP for referrals with Ortho and Dermatology. Patient also mentioned he was interested in Cardiac Rehab. I advised we could talk about this with Amy next funny.   Weight- 319lbs  Weight last week- 324lbs   CBG- 181   NO refills needed.       Future Appointments  Date Time Provider [place]  04/27/2019 11:00 AM Hosie Spangle [facility]  04/30/2019  1:30 PM Hosie Spangle [facility]  05/01/2019 10:30 AM [facility]  PA/NP [facility] None  05/03/2019  1:30 PM Herma Carson, PT [facility]  05/09/2019 11:00 AM Herma Carson, PT [facility]  05/11/2019 11:00 AM Herma Carson, PT [facility]  05/14/2019 11:00 AM Herma Carson, PT [facility]  05/16/2019 11:00 AM Herma Carson, PT [facility]  06/11/2019 10:50 AM Posey Pronto, Delena Serve, DO [facility] None     ACTION: Home visit completed Next visit planned for one week

## 2019-04-27 ENCOUNTER — Ambulatory Visit: Payer: Medicaid Other | Admitting: Physical Therapy

## 2019-04-27 ENCOUNTER — Encounter: Payer: Self-pay | Admitting: Physical Therapy

## 2019-04-27 DIAGNOSIS — M6281 Muscle weakness (generalized): Secondary | ICD-10-CM | POA: Diagnosis not present

## 2019-04-27 DIAGNOSIS — G629 Polyneuropathy, unspecified: Secondary | ICD-10-CM | POA: Diagnosis not present
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
## 2019-04-30 ENCOUNTER — Encounter: Payer: Self-pay | Admitting: Physical Therapy

## 2019-04-30 ENCOUNTER — Ambulatory Visit: Payer: Medicaid Other | Admitting: Physical Therapy

## 2019-04-30 ENCOUNTER — Other Ambulatory Visit: Payer: Self-pay

## 2019-04-30 DIAGNOSIS — M6281 Muscle weakness (generalized): Secondary | ICD-10-CM | POA: Diagnosis not present

## 2019-04-30 DIAGNOSIS — G629 Polyneuropathy, unspecified: Secondary | ICD-10-CM | POA: Diagnosis not present
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
## 2019-05-01 ENCOUNTER — Ambulatory Visit (HOSPITAL_COMMUNITY)
Admission: RE | Admit: 2019-05-01 | Discharge: 2019-05-01 | Disposition: A | Discharge status: Home / Self Care | Payer: Medicaid Other | Source: Ambulatory Visit | Attending: Adult Health | Admitting: Adult Health

## 2019-05-01 ENCOUNTER — Encounter (HOSPITAL_COMMUNITY): Payer: Self-pay

## 2019-05-01 ENCOUNTER — Other Ambulatory Visit (HOSPITAL_COMMUNITY): Payer: Self-pay

## 2019-05-01 VITALS — BP 124/76 | HR 80 | Wt 324.1 lb

## 2019-05-01 DIAGNOSIS — I25110 Atherosclerotic heart disease of native coronary artery with unstable angina pectoris: Secondary | ICD-10-CM

## 2019-05-01 DIAGNOSIS — I2510 Atherosclerotic heart disease of native coronary artery without angina pectoris: Secondary | ICD-10-CM | POA: Diagnosis not present

## 2019-05-01 DIAGNOSIS — R002 Palpitations: Secondary | ICD-10-CM | POA: Diagnosis not present

## 2019-05-01 DIAGNOSIS — Z8249 Family history of ischemic heart disease and other diseases of the circulatory system: Secondary | ICD-10-CM | POA: Diagnosis not present

## 2019-05-01 DIAGNOSIS — Z79899 Other long term (current) drug therapy: Secondary | ICD-10-CM | POA: Diagnosis not present

## 2019-05-01 DIAGNOSIS — Z6841 Body Mass Index (BMI) 40.0 and over, adult: Secondary | ICD-10-CM | POA: Insufficient documentation

## 2019-05-01 DIAGNOSIS — E785 Hyperlipidemia, unspecified: Secondary | ICD-10-CM | POA: Insufficient documentation

## 2019-05-01 DIAGNOSIS — E1140 Type 2 diabetes mellitus with diabetic neuropathy, unspecified: Secondary | ICD-10-CM | POA: Insufficient documentation

## 2019-05-01 DIAGNOSIS — G4733 Obstructive sleep apnea (adult) (pediatric): Secondary | ICD-10-CM | POA: Diagnosis not present

## 2019-05-01 DIAGNOSIS — Z955 Presence of coronary angioplasty implant and graft: Secondary | ICD-10-CM | POA: Insufficient documentation

## 2019-05-01 DIAGNOSIS — I5032 Chronic diastolic (congestive) heart failure: Secondary | ICD-10-CM | POA: Diagnosis not present

## 2019-05-01 DIAGNOSIS — Z86711 Personal history of pulmonary embolism: Secondary | ICD-10-CM | POA: Insufficient documentation

## 2019-05-01 DIAGNOSIS — M1990 Unspecified osteoarthritis, unspecified site: Secondary | ICD-10-CM | POA: Insufficient documentation

## 2019-05-01 DIAGNOSIS — Z720 Tobacco use: Secondary | ICD-10-CM

## 2019-05-01 DIAGNOSIS — F17210 Nicotine dependence, cigarettes, uncomplicated: Secondary | ICD-10-CM | POA: Insufficient documentation

## 2019-05-01 DIAGNOSIS — E7800 Pure hypercholesterolemia, unspecified: Secondary | ICD-10-CM | POA: Insufficient documentation

## 2019-05-01 DIAGNOSIS — I110 Hypertensive heart disease with heart failure: Secondary | ICD-10-CM | POA: Diagnosis not present

## 2019-05-01 DIAGNOSIS — Z86718 Personal history of other venous thrombosis and embolism: Secondary | ICD-10-CM | POA: Insufficient documentation

## 2019-05-01 DIAGNOSIS — Z7902 Long term (current) use of antithrombotics/antiplatelets: Secondary | ICD-10-CM | POA: Insufficient documentation

## 2019-05-01 DIAGNOSIS — Z7982 Long term (current) use of aspirin: Secondary | ICD-10-CM | POA: Insufficient documentation

## 2019-05-01 DIAGNOSIS — Z9981 Dependence on supplemental oxygen: Secondary | ICD-10-CM | POA: Diagnosis not present

## 2019-05-01 DIAGNOSIS — R0602 Shortness of breath: Secondary | ICD-10-CM | POA: Insufficient documentation

## 2019-05-01 LAB — BASIC METABOLIC PANEL
Anion gap: 9 (ref 5–15)
BUN: 20 mg/dL (ref 6–20)
CO2: 25 mmol/L (ref 22–32)
Calcium: 9 mg/dL (ref 8.9–10.3)
Chloride: 104 mmol/L (ref 98–111)
Creatinine, Ser: 1.4 mg/dL — ABNORMAL HIGH (ref 0.61–1.24)
GFR calc Af Amer: >60 mL/min (ref >60)
GFR calc non Af Amer: 54 mL/min — ABNORMAL LOW (ref >60)
Glucose, Bld: 109 mg/dL — ABNORMAL HIGH (ref 70–99)
Potassium: 4.5 mmol/L (ref 3.5–5.1)
Sodium: 138 mmol/L (ref 135–145)

## 2019-05-01 MED ORDER — FARXIGA 10 MG PO TABS: 10.0000 mg | ORAL_TABLET | Freq: Every day | ORAL | 6 refills | Status: DC
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
## 2019-05-01 NOTE — Progress Notes (Signed)
Arrived at clinic visit with Mr. Albanez who was seen by Amy, PA. Jahleel reports feeling "great" and notices a huge difference when taking his medications as prescribed 3 times daily. Medications reviewed and confirmed. 2 pill boxes filled for Joncarlos. Layson asked about being enrolled in Cardiac Rehab, Amy stated she would refer him for same. I will be making dermatology appointment for [name]. Amy began [place] on Farxiga today. Patient understood reasoning and agreed . Patient also placed on cardiac monitor for one week. I will continue following patient at home. Will follow up in two weeks with Mr. Bordner. Visit complete.

## 2019-05-01 NOTE — Patient Instructions (Addendum)
Start Farxiga 10 mg daily  Labs done today, we will notify you of abnormal results  Labs again in 1 week  Your provider has recommended that  you wear a Zio Patch for 7 days.  This monitor will record your heart rhythm for our review.  IF you have any symptoms while wearing the monitor please press the button.  If you have any issues with the patch or you notice a red or orange light on it please call the company at [phone].  Once you remove the patch please mail it back to the company as soon as possible so we can get the results.  You have been referred to Cardiac Rehab, they will contact you for an appointment  Your physician recommends that you schedule a follow-up appointment in: 1 month  If you have any questions or concerns before your next appointment please send [place] a message through [place] or call our office at [phone].  At the [facility], you and your health needs are our priority. As part of our continuing mission to provide you with exceptional heart care, we have created designated Provider Care Teams. These Care Teams include your primary Cardiologist (physician) and Advanced Practice Providers (APPs- Physician Assistants and Nurse Practitioners) who all work together to provide you with the care you need, when you need it.   You may see any of the following providers on your designated Care Team at your next follow up: Marland Kitchen Dr Glori Bickers . Dr Loralie Champagne . Darrick Grinder, NP . Lyda Jester, PA . Audry Riles, PharmD   Please be sure to bring in all your medications bottles to every appointment.

## 2019-05-03 ENCOUNTER — Encounter: Payer: Self-pay | Admitting: Physical Therapy

## 2019-05-03 ENCOUNTER — Other Ambulatory Visit: Payer: Self-pay | Admitting: Cardiology

## 2019-05-03 ENCOUNTER — Ambulatory Visit: Payer: Medicaid Other | Admitting: Physical Therapy

## 2019-05-03 ENCOUNTER — Other Ambulatory Visit: Payer: Self-pay

## 2019-05-03 ENCOUNTER — Telehealth: Payer: Self-pay | Admitting: Pharmacist

## 2019-05-03 VITALS — HR 96

## 2019-05-03 DIAGNOSIS — M6281 Muscle weakness (generalized): Secondary | ICD-10-CM | POA: Diagnosis not present

## 2019-05-03 DIAGNOSIS — G629 Polyneuropathy, unspecified: Secondary | ICD-10-CM

## 2019-05-03 DIAGNOSIS — I5030 Unspecified diastolic (congestive) heart failure: Secondary | ICD-10-CM

## 2019-05-03 DIAGNOSIS — G4733 Obstructive sleep apnea (adult) (pediatric): Secondary | ICD-10-CM

## 2019-05-03 DIAGNOSIS — I10 Essential (primary) hypertension: Secondary | ICD-10-CM

## 2019-05-03 DIAGNOSIS — I5032 Chronic diastolic (congestive) heart failure: Secondary | ICD-10-CM
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
## 2019-05-03 NOTE — Telephone Encounter (Signed)
Contacted patient in F/U of tobacco cessation attempt.   Patient reports picking up patches.  He started using a patch 3 days ago.  He denies any side effects from the patch.   He has not reduced his smoking intake much but thinks he is spacing out his cigarettes more.    Following some discussion, we agreed that a goal of 10 cigarettes in the next week is our short-term goal.    Plan follow-up by phone in 1 week.

## 2019-05-03 NOTE — Telephone Encounter (Signed)
Noted and agree. 

## 2019-05-08 ENCOUNTER — Ambulatory Visit (HOSPITAL_COMMUNITY)
Admission: RE | Admit: 2019-05-08 | Discharge: 2019-05-08 | Disposition: A | Discharge status: Home / Self Care | Payer: Medicaid Other | Source: Ambulatory Visit | Attending: Cardiology | Admitting: Cardiology

## 2019-05-08 ENCOUNTER — Other Ambulatory Visit: Payer: Self-pay

## 2019-05-08 DIAGNOSIS — I5032 Chronic diastolic (congestive) heart failure: Secondary | ICD-10-CM | POA: Insufficient documentation

## 2019-05-08 LAB — BASIC METABOLIC PANEL
Anion gap: 9 (ref 5–15)
BUN: 15 mg/dL (ref 6–20)
CO2: 25 mmol/L (ref 22–32)
Calcium: 8.9 mg/dL (ref 8.9–10.3)
Chloride: 105 mmol/L (ref 98–111)
Creatinine, Ser: 1.48 mg/dL — ABNORMAL HIGH (ref 0.61–1.24)
GFR calc Af Amer: 59 mL/min — ABNORMAL LOW (ref >60)
GFR calc non Af Amer: 51 mL/min — ABNORMAL LOW (ref >60)
Glucose, Bld: 117 mg/dL — ABNORMAL HIGH (ref 70–99)
Potassium: 4.5 mmol/L (ref 3.5–5.1)
Sodium: 139 mmol/L (ref 135–145)

## 2019-05-09 ENCOUNTER — Encounter: Payer: Self-pay | Admitting: Physical Therapy

## 2019-05-09 ENCOUNTER — Ambulatory Visit: Payer: Medicaid Other | Admitting: Physical Therapy

## 2019-05-09 DIAGNOSIS — M6281 Muscle weakness (generalized): Secondary | ICD-10-CM | POA: Diagnosis not present

## 2019-05-09 DIAGNOSIS — G629 Polyneuropathy, unspecified: Secondary | ICD-10-CM | POA: Diagnosis not present
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
## 2019-05-10 ENCOUNTER — Telehealth: Payer: Self-pay | Admitting: Pharmacist

## 2019-05-10 NOTE — Telephone Encounter (Signed)
Contacted patient RE tobacco cessation.  He reports continued smoking of ~ 10 per day.   Patient reports mild patch intolerance with itching following use.  Patient was aware that this was likely due to the adhesive.  He agreed to continuing the remaining patches he has (#15) and using hydrocortisone cream immediately after taking off the patch.   We also discussed NOT restarting Chantix (past Hx of laryngospasm added to allergy list).  Told patient to bring medication to future visit to permit destruction.   We also discussed potential use of 2mg  lozenged in the future.   I plan to follow-up by phone in 1 week.

## 2019-05-10 NOTE — Telephone Encounter (Signed)
Noted and agree. 

## 2019-05-11 ENCOUNTER — Encounter: Payer: Self-pay | Admitting: Physical Therapy

## 2019-05-11 ENCOUNTER — Telehealth (HOSPITAL_COMMUNITY): Payer: Self-pay

## 2019-05-11 ENCOUNTER — Other Ambulatory Visit: Payer: Self-pay

## 2019-05-11 ENCOUNTER — Ambulatory Visit: Payer: Medicaid Other | Admitting: Physical Therapy

## 2019-05-11 DIAGNOSIS — G629 Polyneuropathy, unspecified: Secondary | ICD-10-CM | POA: Diagnosis not present

## 2019-05-11 DIAGNOSIS — M6281 Muscle weakness (generalized): Secondary | ICD-10-CM | POA: Diagnosis not present

## 2019-05-11 NOTE — Telephone Encounter (Signed)
Called patient to see if he was interested in participating in the Cardiac Rehab Program. Patient stated yes. Patient will come in for orientation on 05/24/2019 @ 2PM and will attend the 11:15AM exercise class.  Mailed letter.

## 2019-05-11 NOTE — Telephone Encounter (Signed)
Pt insurance is active through [place]. Ref# [id number]
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
## 2019-05-14 ENCOUNTER — Encounter: Payer: Self-pay | Admitting: Physical Therapy

## 2019-05-14 ENCOUNTER — Ambulatory Visit: Payer: Medicaid Other | Admitting: Physical Therapy

## 2019-05-14 ENCOUNTER — Other Ambulatory Visit: Payer: Self-pay

## 2019-05-14 VITALS — BP 110/68 | HR 90

## 2019-05-14 DIAGNOSIS — G629 Polyneuropathy, unspecified: Secondary | ICD-10-CM

## 2019-05-14 DIAGNOSIS — M6281 Muscle weakness (generalized): Secondary | ICD-10-CM | POA: Diagnosis not present

## 2019-05-14 MED FILL — VYNDAMAX 61 MG CAPS: 61 | 30 days supply | Qty: 30 | Fill #5
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
## 2019-05-15 ENCOUNTER — Telehealth (HOSPITAL_COMMUNITY): Payer: Self-pay | Admitting: Licensed Clinical Social Worker

## 2019-05-15 ENCOUNTER — Other Ambulatory Visit (HOSPITAL_COMMUNITY): Payer: Self-pay

## 2019-05-15 DIAGNOSIS — K083 Retained dental root: Secondary | ICD-10-CM | POA: Diagnosis not present

## 2019-05-15 NOTE — Progress Notes (Signed)
Paramedicine Encounter    Patient ID: Russell Collier, male    DOB: 31-Jan-1961, 59 y.o.   MRN: [id number]   Patient Care Team: [name], MD as PCP - General (Family Medicine) [name], MD as PCP - Cardiology (Cardiology) Michael Boston, MD as Consulting Physician (General Surgery) Wilford Corner, MD as Consulting Physician (Gastroenterology) Jorge Ny, LCSW as Social Worker (Licensed Clinical Social Worker)  Patient Active Problem List   Diagnosis Date Noted  . Chronic knee pain 04/16/2019  . Diabetes ([place]) 04/16/2019  . Esophageal reflux 02/06/2019  . Heartburn 02/06/2019  . Chronic skin ulcer of lower leg ([place]) 12/21/2018  . S/P drug eluting coronary stent placement   . Coronary artery disease involving native coronary artery of native heart with unstable angina pectoris ([place]) 12/20/2018  . Unstable angina ([place])   . 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 Acute respiratory distress 10/16/2018  . Acute on chronic diastolic heart failure ([place])   . Shortness of breath   . Precordial chest pain   . Idiopathic peripheral neuropathy   . Palpitations   . Chronic diastolic heart failure ([place])   . Abnormal ankle brachial index (ABI)   . Chest pain, rule out acute myocardial infarction 09/26/2018  . Dyspepsia   . Morbid obesity ([place])   . OSA on CPAP   . Chest pain 05/30/2018  . CAD (coronary artery disease) 03/30/2018  . Elevated troponin 02/03/2018  . Positive urine drug screen 02/03/2018  . Tobacco abuse 02/03/2018  . Hyperlipidemia 02/03/2018  . Acute respiratory failure with hypoxia ([place]) 02/02/2018  . Acute congestive heart failure ([place])   . Acute bronchitis 12/28/2015  . Atypical chest pain 12/27/2015  . Essential hypertension   . Neuropathy   . Viral illness     Current Outpatient Medications:  .  albuterol (PROVENTIL) (2.5 MG/3ML) 0.083% nebulizer solution, Take 2.5 mg by nebulization every 6 (six) hours as needed for wheezing or shortness of breath., Disp:  , Rfl:  .  albuterol (VENTOLIN HFA) 108 (90 Base) MCG/ACT inhaler, Inhale 2 puffs into the lungs every 6 (six) hours as needed for wheezing or shortness of breath., Disp: , Rfl:  .  aspirin 81 MG chewable tablet, Chew 1 tablet (81 mg total) by mouth daily., Disp: 90 tablet, Rfl: 3 .  atorvastatin (LIPITOR) 80 MG tablet, Take 1 tablet (80 mg total) by mouth daily., Disp: 90 tablet, Rfl: 3 .  clopidogrel (PLAVIX) 75 MG tablet, Take 1 tablet (75 mg total) by mouth daily with breakfast., Disp: 90 tablet, Rfl: 3 .  dapagliflozin propanediol (FARXIGA) 10 MG TABS tablet, Take 10 mg by mouth daily before breakfast. (Patient not taking: Reported on 05/01/2019), Disp: 30 tablet, Rfl: 6 .  enalapril (VASOTEC) 20 MG tablet, Take 20 mg by mouth 2 (two) times daily., Disp: , Rfl:  .  fluticasone (FLONASE) 50 MCG/ACT nasal spray, Place 1 spray into both nostrils daily., Disp: 18.2 mL, Rfl: 2 .  furosemide (LASIX) 40 MG tablet, Take 1 tablet (40 mg total) by mouth 2 (two) times daily., Disp: 180 tablet, Rfl: 3 .  Ibuprofen (ADVIL) 200 MG CAPS, Take 800 mg by mouth 3 (three) times daily., Disp: , Rfl:  .  isosorbide mononitrate (IMDUR) 30 MG 24 hr tablet, Take 1 tablet (30 mg total) by mouth daily., Disp: 30 tablet, Rfl: 0 .  methocarbamol (ROBAXIN) 500 MG tablet, Take 1 tablet (500 mg total) by mouth every 8 (eight) hours as needed for muscle spasms., Disp: 15  tablet, Rfl: 0 .  Multiple Vitamin (MULTIVITAMIN WITH MINERALS) TABS tablet, Take 1 tablet by mouth daily., Disp: , Rfl:  .  nicotine (NICOTINE STEP 2) 14 mg/24hr patch, Place 1 patch (14 mg total) onto the skin daily., Disp: 28 patch, Rfl: 3 .  pantoprazole (PROTONIX) 40 MG tablet, Take 1 tablet (40 mg total) by mouth 2 (two) times daily., Disp: 60 tablet, Rfl: 0 .  potassium chloride SA (KLOR-CON) 20 MEQ tablet, TAKE 1 TABLET(20 MEQ) BY MOUTH DAILY, Disp: 90 tablet, Rfl: 2 .  pregabalin (LYRICA) 100 MG capsule, Take 1 tablet twice daily, Disp: 60 capsule, Rfl:  3 .  spironolactone (ALDACTONE) 25 MG tablet, Take 0.5 tablets (12.5 mg total) by mouth daily., Disp: 45 tablet, Rfl: 3 .  Tafamidis (VYNDAMAX) 61 MG CAPS, Take 61 mg by mouth daily., Disp: , Rfl:  .  valACYclovir (VALTREX) 500 MG tablet, Take 500 mg by mouth as needed. , Disp: , Rfl:  Allergies  Allergen Reactions  . Varenicline Other (See Comments)    Patient reports laryngospasm stopped in the ER     Social History   Socioeconomic History  . Marital status: Divorced    Spouse name: Not on file  . Number of children: Not on file  . Years of education: Not on file  . Highest education level: Not on file  Occupational History  . Not on file  Tobacco Use  . Smoking status: Current Every Day Smoker    Packs/day: 0.50    Years: 54.00    Pack years: 27.00    Types: Cigarettes, Cigars  . Smokeless tobacco: Never Used  Substance and Sexual Activity  . Alcohol use: Yes    Alcohol/week: 1.0 standard drinks    Types: 1 Cans of beer per week  . Drug use: Yes    Comment: last use may last year  . Sexual activity: Yes  Other Topics Concern  . Not on file  Social History Narrative  . Not on file   Social Determinants of Health   Financial Resource Strain: Low Risk   . Difficulty of Paying Living Expenses: Not very hard  Food Insecurity: No Food Insecurity  . Worried About Charity fundraiser in the Last Year: Never true  . Ran Out of Food in the Last Year: Never true  Transportation Needs: No Transportation Needs  . Lack of Transportation (Medical): No  . Lack of Transportation (Non-Medical): No  Physical Activity: Insufficiently Active  . Days of Exercise per Week: 2 days  . Minutes of Exercise per Session: 20 min  Stress: No Stress Concern Present  . Feeling of Stress : Not at all  Social Connections: Somewhat Isolated  . Frequency of Communication with Friends and Family: More than three times a week  . Frequency of Social Gatherings with Friends and Family: Twice a  week  . Attends Religious Services: 1 to 4 times per year  . Active Member of Clubs or Organizations: No  . Attends Archivist Meetings: Never  . Marital Status: Divorced  Human resources officer Violence: Not At Risk  . Fear of Current or Ex-Partner: No  . Emotionally Abused: No  . Physically Abused: No  . Sexually Abused: No    Physical Exam Vitals reviewed.  HENT:     Head: Normocephalic.     Nose: Nose normal.     Mouth/Throat:     Mouth: Mucous membranes are moist.     Pharynx: Oropharynx is clear.  Eyes:     Pupils: Pupils are equal, round, and reactive to light.  Cardiovascular:     Rate and Rhythm: Normal rate and regular rhythm.     Pulses: Normal pulses.     Heart sounds: Normal heart sounds.  Pulmonary:     Effort: Pulmonary effort is normal.     Breath sounds: Normal breath sounds.  Abdominal:     Palpations: Abdomen is soft.  Musculoskeletal:        General: Normal range of motion.     Cervical back: Normal range of motion.     Right lower leg: No edema.     Left lower leg: No edema.  Skin:    General: Skin is warm and dry.     Capillary Refill: Capillary refill takes less than 2 seconds.  Neurological:     Mental Status: He is alert. Mental status is at baseline.  Psychiatric:        Mood and Affect: Mood normal.      Arrived for home visit for Russell Collier who was alert and oriented seated at his kitchen table. Russell Collier reports feeling good and taking all of his medications for the last two weeks. Pill box reflected same. Medications were reviewed and confirmed. Russell Collier stated he wanted to switch his medicines to [employer] for delivery services. I advised Russell Collier I would call [facility] to make the transfer to [place]. Vitals were obtained and are as noted in report as well as assessment. No edema noted. Russell Collier stated he is doing well with therapy. Russell Collier stated he was interested in the [employer], I relayed this information to [employer]. Russell Collier still  interested in seeking appointment with dermatology, I will follow up with same. Home visit complete. I will see patient in one week to follow up on meds and home visit in two weeks.    Weight- 320lbs  CBG- 119    Refills: Pregabalin  Potassium Enalapril Pantoprazole  Lasix  Methocarbamol Farxiga     Future Appointments  Date Time Provider [place]  05/16/2019 11:00 AM Herma Carson, PT [name]  05/24/2019  2:00 PM [facility] PHASE II ORIENT [facility] None  05/29/2019 12:00 PM [facility] PA/NP [facility] None  05/30/2019 11:15 AM [facility] PHASE II EXC [facility] None  06/01/2019 11:15 AM [facility] PHASE II EXC [facility] None  06/04/2019 11:15 AM [facility] PHASE II EXC [facility] None  06/06/2019 11:15 AM [facility] PHASE II EXC [facility] None  06/08/2019 11:15 AM [facility] PHASE II EXC [facility] None  06/11/2019 10:50 AM Patel, Donika K, DO [facility] None  06/11/2019 11:15 AM [facility] PHASE II EXC [facility] None  06/13/2019 11:15 AM [facility] PHASE II EXC [facility] None  06/15/2019 11:15 AM [facility] PHASE II EXC [facility] None  06/18/2019 11:15 AM [facility] PHASE II EXC [facility] None  06/20/2019 11:15 AM [facility] PHASE II EXC [facility] None  06/22/2019 11:15 AM [facility] PHASE II EXC [facility] None  06/25/2019 11:15 AM [facility] PHASE II EXC [facility] None  06/27/2019 11:15 AM [facility] PHASE II EXC [facility] None  06/29/2019 11:15 AM [facility] PHASE II EXC [facility] None  07/02/2019 11:15 AM [facility] PHASE II EXC [facility] None  07/04/2019 11:15 AM [facility] PHASE II EXC [facility] None  07/06/2019 11:15 AM [facility] PHASE II EXC [facility] None  07/09/2019 11:15 AM [facility] PHASE II EXC [facility] None  07/11/2019 11:15 AM [facility] PHASE II EXC [facility] None  07/13/2019 11:15 AM [facility] PHASE II EXC [facility] None  07/16/2019 11:15 AM [facility] PHASE II EXC [facility]  None  07/18/2019 11:15 AM [facility] PHASE II EXC [facility] None  07/20/2019 11:15 AM [facility] PHASE II EXC [facility] None     ACTION: Home visit  completed Next visit planned for two weeks

## 2019-05-15 NOTE — Telephone Encounter (Signed)
CSW informed by [employer] that pt would like help with vaccine, changing PCP on his medicaid card, and getting medication list to his orthodontist so he can be approved for a procedure.  CSW spoke with pt regarding vaccine- concerned about getting transport there but thinks his sister can take him.  Provided pt number to [place] so he can schedule appt with [facility].  Pt will speak with his sister and schedule appt when she can transport- will call CSW back if she will be unable to provide transport and CSW can assist with coordinating transportation for vaccine.  CSW instructed pt to call his Medicaid worker to get PCP changed on his Medicaid card- pt reports he has their number and will follow up regarding this  CSW able to fax med list to orthodontist- confirmed pt is agreeable to this and med list faxed  CSW will continue to follow and assist as needed  Jorge Ny, [facility] Desk#: [phone] Cell#: [phone]

## 2019-05-16 ENCOUNTER — Ambulatory Visit: Payer: Medicaid Other | Admitting: Physical Therapy

## 2019-05-16 ENCOUNTER — Other Ambulatory Visit: Payer: Self-pay

## 2019-05-16 ENCOUNTER — Telehealth (HOSPITAL_COMMUNITY): Payer: Self-pay | Admitting: Pharmacist

## 2019-05-16 ENCOUNTER — Encounter: Payer: Self-pay | Admitting: Physical Therapy

## 2019-05-16 ENCOUNTER — Ambulatory Visit: Payer: Medicaid Other | Admitting: Podiatry

## 2019-05-16 ENCOUNTER — Encounter: Payer: Self-pay | Admitting: Podiatry

## 2019-05-16 DIAGNOSIS — G629 Polyneuropathy, unspecified: Secondary | ICD-10-CM | POA: Diagnosis not present

## 2019-05-16 DIAGNOSIS — M79675 Pain in left toe(s): Secondary | ICD-10-CM

## 2019-05-16 DIAGNOSIS — M6281 Muscle weakness (generalized): Secondary | ICD-10-CM | POA: Diagnosis not present

## 2019-05-16 DIAGNOSIS — M79674 Pain in right toe(s): Secondary | ICD-10-CM | POA: Diagnosis not present

## 2019-05-16 DIAGNOSIS — E1151 Type 2 diabetes mellitus with diabetic peripheral angiopathy without gangrene: Secondary | ICD-10-CM

## 2019-05-16 DIAGNOSIS — M79673 Pain in unspecified foot: Secondary | ICD-10-CM | POA: Insufficient documentation

## 2019-05-16 DIAGNOSIS — B351 Tinea unguium: Secondary | ICD-10-CM | POA: Insufficient documentation
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
## 2019-05-16 NOTE — Telephone Encounter (Signed)
Cardiac Rehab Medication Review by a Pharmacist  Does the patient  feel that his/her medications are working for him/her?  yes  Has the patient been experiencing any side effects to the medications prescribed?  no  Does the patient measure his/her own blood pressure or blood glucose at home?  yes BG 132 (post prandial)   Does the patient have any problems obtaining medications due to transportation or finances?   no  Understanding of regimen: good Understanding of indications: good Potential of compliance: good  Pharmacist comments: none  Lorel Monaco, PharmD PGY1 [place] Resident [facility] # [phone]

## 2019-05-16 NOTE — Progress Notes (Signed)
This patient presents  to my office for at risk foot care.  This patient requires this care by a professional since this patient will be at risk due to having diabetes, peripheral neuropathy and coagulation defect.  Patient is taking plavix.  This patient is unable to cut nails himself since the patient cannot reach his nails.These nails are painful walking and wearing shoes.  This patient presents for at risk foot care today. He is accompanied by his sister for this visit.  General Appearance  Alert, conversant and in no acute stress.  Vascular  Dorsalis pedis and posterior tibial  pulses are weakly  palpable  bilaterally.  Capillary return is within normal limits  bilaterally. Temperature is within normal limits  bilaterally.  Neurologic  Senn-Weinstein monofilament wire test within normal limits  bilaterally. Muscle power within normal limits bilaterally.  Nails Thick disfigured discolored nails with subungual debris  from hallux to fifth toes bilaterally. No evidence of bacterial infection or drainage bilaterally.  Orthopedic  No limitations of motion  feet .  No crepitus or effusions noted.  No bony pathology or digital deformities noted.  HAV  Left.  Pes planus.  Skin  normotropic skin with no porokeratosis noted bilaterally.  No signs of infections or ulcers noted.     Onychomycosis  Pain in right toes  Pain in left toes  Consent was obtained for treatment procedures.   Mechanical debridement of nails 1-5  bilaterally performed with a nail nipper.  Filed with dremel without incident.    Return office visit  3 months                    Told patient to return for periodic foot care and evaluation due to potential at risk complications.   Gardiner Barefoot DPM

## 2019-05-17 ENCOUNTER — Telehealth: Payer: Self-pay | Admitting: Pharmacist

## 2019-05-17 MED ORDER — NICOTINE POLACRILEX 4 MG MT LOZG: 4.0000 mg | LOZENGE | OROMUCOSAL | 3 refills | Status: DC | PRN

## 2019-05-17 NOTE — Telephone Encounter (Signed)
Contacted patient RE tobacco cessation.  He reports problematic derm reaction necessitating stopping of the patch.   He has stopped the patch several days prior, reports much improved but some remaining skin irritation.   Advised to continue with hydrocortisone cream PRN and stressed it is likely that this will completely resolve on its own.   New plan:  Start nicotine lozenge 4mg  PRN  - new rx sent to new [facility]  Follow-up in 1 week.

## 2019-05-21 NOTE — Telephone Encounter (Signed)
Noted and agree. 

## 2019-05-23 ENCOUNTER — Telehealth (HOSPITAL_COMMUNITY): Payer: Self-pay | Admitting: *Deleted

## 2019-05-23 ENCOUNTER — Telehealth: Payer: Self-pay | Admitting: Pharmacist

## 2019-05-23 NOTE — Telephone Encounter (Signed)
Contacted patient RE tobacco cessation.  He reports that his smoking has actually increased recently to ~ 10 cigs per day.  He initially stated that he had received the nicotine lozenges from the pharmacy.  Upon his review of the medication he determined that they had dispensed nicotine lozenges.  Patient contact pharmacy and he was told that the gum was all that is covered by Medicaid so they sent him the GUM.    Following a brief discussion, he agreed to try the gum with his 4 front teeth (he has no back teeth and his denture no longer fits).   Reassess use of gum in 1 week.    Retry Nicotine Lozenge prescription if that does not work. ([place] Medicaid does cover certain brands of lozenge.)  Follow up by phone in 1 week

## 2019-05-23 NOTE — Telephone Encounter (Signed)
Noted and agree. 

## 2019-05-24 ENCOUNTER — Telehealth (HOSPITAL_COMMUNITY): Payer: Self-pay

## 2019-05-24 ENCOUNTER — Other Ambulatory Visit: Payer: Self-pay

## 2019-05-24 ENCOUNTER — Encounter (HOSPITAL_COMMUNITY)
Admission: RE | Admit: 2019-05-24 | Discharge: 2019-05-24 | Disposition: A | Discharge status: Home / Self Care | Payer: Medicaid Other | Source: Ambulatory Visit | Attending: Internal Medicine | Admitting: Internal Medicine

## 2019-05-24 VITALS — BP 108/70 | HR 76 | Temp 97.5°F | Ht 69.0 in | Wt 325.6 lb

## 2019-05-24 DIAGNOSIS — Z955 Presence of coronary angioplasty implant and graft: Secondary | ICD-10-CM

## 2019-05-24 LAB — GLUCOSE, CAPILLARY: Glucose-Capillary: 125 mg/dL — ABNORMAL HIGH (ref 70–99)
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
## 2019-05-24 NOTE — Telephone Encounter (Signed)
Left message with Christy Sartorius at [facility] to confirm the patient will be receiving the following medications needed for refill asap.    Pregabalin  Potassium Enalapril Pantoprazole Lasix Methocarbamol Farixga

## 2019-05-25 ENCOUNTER — Other Ambulatory Visit: Payer: Self-pay

## 2019-05-25 ENCOUNTER — Telehealth: Payer: Self-pay | Admitting: Family Medicine

## 2019-05-25 DIAGNOSIS — I10 Essential (primary) hypertension: Secondary | ICD-10-CM

## 2019-05-25 DIAGNOSIS — I5030 Unspecified diastolic (congestive) heart failure: Secondary | ICD-10-CM

## 2019-05-25 DIAGNOSIS — G4733 Obstructive sleep apnea (adult) (pediatric): Secondary | ICD-10-CM

## 2019-05-25 DIAGNOSIS — I5032 Chronic diastolic (congestive) heart failure: Secondary | ICD-10-CM

## 2019-05-25 MED ORDER — POTASSIUM CHLORIDE CRYS ER 20 MEQ PO TBCR: EXTENDED_RELEASE_TABLET | ORAL | 3 refills | Status: DC

## 2019-05-25 NOTE — Telephone Encounter (Signed)
Spoke with patient regarding prescription concerns.  Called in prescriptions to his pharmacy for Lyrica 100 mg twice daily, enalapril 20 mg twice daily, Klor-Con 20 mEq daily.  I gave him 90-day supplies with multiple refills.  Patient also had questions regarding knee pain and if he can get an injection in his other knee as well as questions regarding breast tenderness.  I scheduled him an appointment for 4/8 at 1015.

## 2019-05-28 ENCOUNTER — Telehealth (HOSPITAL_COMMUNITY): Payer: Self-pay | Admitting: Pharmacist

## 2019-05-28 NOTE — Telephone Encounter (Signed)
Advanced Heart Failure Patient Advocate Encounter  Prior Authorization for Wilder Glade has been approved.    PA# [id number] Effective dates: 05/28/2019 - 05/22/2020  Patients co-pay is $3.00  Audry Riles, PharmD, BCPS, BCCP, CPP Heart Failure Clinic Pharmacist [phone]

## 2019-05-28 NOTE — Telephone Encounter (Signed)
Patient Advocate Encounter   Received notification from [facility] Medicaid that prior authorization for Wilder Glade is required.   PA submitted on [facility] Tracks Confirmation #: [id number] W Recipient ID: [id number] M Status is pending   Will continue to follow.  Audry Riles, PharmD, BCPS, BCCP, CPP Heart Failure Clinic Pharmacist [phone]

## 2019-05-29 ENCOUNTER — Other Ambulatory Visit (HOSPITAL_COMMUNITY): Payer: Self-pay

## 2019-05-29 ENCOUNTER — Telehealth: Payer: Self-pay

## 2019-05-29 ENCOUNTER — Other Ambulatory Visit: Payer: Self-pay

## 2019-05-29 ENCOUNTER — Ambulatory Visit (HOSPITAL_COMMUNITY)
Admission: RE | Admit: 2019-05-29 | Discharge: 2019-05-29 | Disposition: A | Discharge status: Home / Self Care | Payer: Medicaid Other | Source: Ambulatory Visit | Attending: Cardiology | Admitting: Cardiology

## 2019-05-29 ENCOUNTER — Encounter (HOSPITAL_COMMUNITY): Payer: Self-pay

## 2019-05-29 VITALS — BP 134/82 | HR 70 | Wt 321.0 lb

## 2019-05-29 DIAGNOSIS — Z79899 Other long term (current) drug therapy: Secondary | ICD-10-CM | POA: Diagnosis not present

## 2019-05-29 DIAGNOSIS — Z7982 Long term (current) use of aspirin: Secondary | ICD-10-CM | POA: Insufficient documentation

## 2019-05-29 DIAGNOSIS — E7800 Pure hypercholesterolemia, unspecified: Secondary | ICD-10-CM | POA: Insufficient documentation

## 2019-05-29 DIAGNOSIS — E854 Organ-limited amyloidosis: Secondary | ICD-10-CM | POA: Insufficient documentation

## 2019-05-29 DIAGNOSIS — I2510 Atherosclerotic heart disease of native coronary artery without angina pectoris: Secondary | ICD-10-CM | POA: Diagnosis not present

## 2019-05-29 DIAGNOSIS — Z720 Tobacco use: Secondary | ICD-10-CM

## 2019-05-29 DIAGNOSIS — Z7984 Long term (current) use of oral hypoglycemic drugs: Secondary | ICD-10-CM | POA: Insufficient documentation

## 2019-05-29 DIAGNOSIS — Z791 Long term (current) use of non-steroidal anti-inflammatories (NSAID): Secondary | ICD-10-CM | POA: Diagnosis not present

## 2019-05-29 DIAGNOSIS — E785 Hyperlipidemia, unspecified: Secondary | ICD-10-CM | POA: Diagnosis not present

## 2019-05-29 DIAGNOSIS — Z6841 Body Mass Index (BMI) 40.0 and over, adult: Secondary | ICD-10-CM | POA: Diagnosis not present

## 2019-05-29 DIAGNOSIS — R002 Palpitations: Secondary | ICD-10-CM | POA: Diagnosis not present

## 2019-05-29 DIAGNOSIS — F17210 Nicotine dependence, cigarettes, uncomplicated: Secondary | ICD-10-CM | POA: Insufficient documentation

## 2019-05-29 DIAGNOSIS — G4733 Obstructive sleep apnea (adult) (pediatric): Secondary | ICD-10-CM

## 2019-05-29 DIAGNOSIS — Z955 Presence of coronary angioplasty implant and graft: Secondary | ICD-10-CM | POA: Insufficient documentation

## 2019-05-29 DIAGNOSIS — Z833 Family history of diabetes mellitus: Secondary | ICD-10-CM | POA: Insufficient documentation

## 2019-05-29 DIAGNOSIS — Z9989 Dependence on other enabling machines and devices: Secondary | ICD-10-CM | POA: Diagnosis not present

## 2019-05-29 DIAGNOSIS — E1140 Type 2 diabetes mellitus with diabetic neuropathy, unspecified: Secondary | ICD-10-CM | POA: Diagnosis not present

## 2019-05-29 DIAGNOSIS — Z86718 Personal history of other venous thrombosis and embolism: Secondary | ICD-10-CM | POA: Insufficient documentation

## 2019-05-29 DIAGNOSIS — I5032 Chronic diastolic (congestive) heart failure: Secondary | ICD-10-CM

## 2019-05-29 DIAGNOSIS — Z86711 Personal history of pulmonary embolism: Secondary | ICD-10-CM | POA: Insufficient documentation

## 2019-05-29 DIAGNOSIS — Z8249 Family history of ischemic heart disease and other diseases of the circulatory system: Secondary | ICD-10-CM | POA: Insufficient documentation

## 2019-05-29 DIAGNOSIS — I110 Hypertensive heart disease with heart failure: Secondary | ICD-10-CM | POA: Insufficient documentation

## 2019-05-29 MED ORDER — FARXIGA 10 MG PO TABS: 10.0000 mg | ORAL_TABLET | Freq: Every day | ORAL | 6 refills | Status: DC

## 2019-05-29 NOTE — Patient Instructions (Signed)
Your physician has recommended that you have a sleep study. This test records several body functions during sleep, including: brain activity, eye movement, oxygen and carbon dioxide blood levels, heart rate and rhythm, breathing rate and rhythm, the flow of air through your mouth and nose, snoring, body muscle movements, and chest and belly movement.  Your physician recommends that you schedule a follow-up appointment in: 2 months  If you have any questions or concerns before your next appointment please send [place] a message through [place] or call our office at [phone].  At the [facility], you and your health needs are our priority. As part of our continuing mission to provide you with exceptional heart care, we have created designated Provider Care Teams. These Care Teams include your primary Cardiologist (physician) and Advanced Practice Providers (APPs- Physician Assistants and Nurse Practitioners) who all work together to provide you with the care you need, when you need it.   You may see any of the following providers on your designated Care Team at your next follow up: Marland Kitchen Dr Glori Bickers . Dr Loralie Champagne . Darrick Grinder, NP . Lyda Jester, PA . Audry Riles, PharmD   Please be sure to bring in all your medications bottles to every appointment.

## 2019-05-29 NOTE — Telephone Encounter (Signed)
Received phone call from [place], needing verification of PCP's DEA number. Spoke with Butch Penny and was able to verify for patient's prescriptions to be processed.   Also received phone call from [place], [facility] health nurse about issue. Updated patient and pharmacy should be refilling medications.   FYI to PCP  Talbot Grumbling, RN

## 2019-05-29 NOTE — Progress Notes (Signed)
Saw [name] in the clinic today where he was alert and oriented seated in a wheel chair reporting he is having some joint pain more so than normal. Patient also noted to have been without his Lyrica for several days now. Vitals were obtained by RN. Patient's weight at 321lbs today. Medications were reviewed and verified.   Lyrica, Potassium and Enalapril ready for pick up at [facility]. I will place same in pill box this afternoon.   Patient missing Methocarbamol, Pantoprazole and Valtrex. Pharmacy stating too soon to fill for first two and Valtrex not received during transition of pharmacies.  Staff message sent over to Dr. Caron Presume about same. I will continue to follow and assist.   Patient needs this week: -Robaxin (whole box) -Pantoprazole (Sun-Sat PM doses) -Valtrex (next week) -ASA (next week)  I will reschedule orthopedic appointment for [name].   I will see patient in one week
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
## 2019-05-30 ENCOUNTER — Other Ambulatory Visit: Payer: Self-pay | Admitting: Family Medicine

## 2019-05-30 ENCOUNTER — Encounter (HOSPITAL_COMMUNITY): Payer: Medicaid Other

## 2019-05-30 ENCOUNTER — Telehealth (HOSPITAL_COMMUNITY): Payer: Self-pay

## 2019-05-30 ENCOUNTER — Telehealth (HOSPITAL_COMMUNITY): Payer: Self-pay | Admitting: Family Medicine

## 2019-05-30 ENCOUNTER — Ambulatory Visit (HOSPITAL_COMMUNITY): Payer: Medicaid Other

## 2019-05-30 MED ORDER — PANTOPRAZOLE SODIUM 40 MG PO TBEC: 40.0000 mg | DELAYED_RELEASE_TABLET | Freq: Every day | ORAL | 0 refills | Status: DC

## 2019-05-30 NOTE — Telephone Encounter (Signed)
Left message for Russell Collier making him aware of scheduled Orthopedic appointment on 4/13 at 2:00.

## 2019-05-30 NOTE — Progress Notes (Signed)
Spoke with patient regarding his prescriptions.  Patient reports he has been taking the pantoprazole twice daily since December when he was in the hospital.  Treatment dose with pantoprazole is twice daily for 2 months.  That time is finished so I have decreased him to taking it once daily.  Patient also has questions regarding cramping which is most likely related to him not having potassium supplementation for several days.  He feels like it is improving now that he is back on his home dose of potassium.  Regarding his Robaxin prescription upon chart review I noticed he only received 15 pills of that in December.  He reports that that helps with pain and would like more.  We will discuss this further at his appointment tomorrow.

## 2019-05-31 ENCOUNTER — Encounter: Payer: Self-pay | Admitting: Family Medicine

## 2019-05-31 ENCOUNTER — Encounter (HOSPITAL_COMMUNITY): Payer: Self-pay | Admitting: *Deleted

## 2019-05-31 ENCOUNTER — Other Ambulatory Visit: Payer: Self-pay

## 2019-05-31 ENCOUNTER — Telehealth: Payer: Self-pay | Admitting: Pharmacist

## 2019-05-31 ENCOUNTER — Ambulatory Visit (INDEPENDENT_AMBULATORY_CARE_PROVIDER_SITE_OTHER): Payer: Medicaid Other | Admitting: Family Medicine

## 2019-05-31 VITALS — BP 118/60 | HR 87 | Ht 70.0 in | Wt 323.4 lb

## 2019-05-31 DIAGNOSIS — G8929 Other chronic pain: Secondary | ICD-10-CM | POA: Diagnosis not present

## 2019-05-31 DIAGNOSIS — G894 Chronic pain syndrome: Secondary | ICD-10-CM

## 2019-05-31 DIAGNOSIS — M25562 Pain in left knee: Secondary | ICD-10-CM | POA: Diagnosis not present

## 2019-05-31 DIAGNOSIS — N179 Acute kidney failure, unspecified: Secondary | ICD-10-CM

## 2019-05-31 DIAGNOSIS — M25561 Pain in right knee: Secondary | ICD-10-CM

## 2019-05-31 DIAGNOSIS — N644 Mastodynia: Secondary | ICD-10-CM

## 2019-05-31 DIAGNOSIS — Z955 Presence of coronary angioplasty implant and graft: Secondary | ICD-10-CM

## 2019-05-31 HISTORY — DX: Mastodynia: N64.4

## 2019-05-31 MED ORDER — PREGABALIN 100 MG PO CAPS: ORAL_CAPSULE | ORAL | 3 refills | Status: DC

## 2019-05-31 MED ORDER — METHYLPREDNISOLONE ACETATE 40 MG/ML IJ SUSP
40.0000 mg | Freq: Once | INTRAMUSCULAR | Status: AC
  Administered 2019-05-31: 40 mg via INTRAMUSCULAR

## 2019-05-31 MED ORDER — NICOTINE POLACRILEX 4 MG MT LOZG: 4.0000 mg | LOZENGE | OROMUCOSAL | 3 refills | Status: DC | PRN

## 2019-05-31 NOTE — Patient Instructions (Signed)
It was wonderful to see you today.  We did an injection in your left knee and I hope this helps with the pain and makes it more bearable.  We really need you to stop taking your ibuprofen.  Your kidneys are not functioning normally and we want to stop them from getting worse.  I am going to check your kidney function today.  For pain control I am going to increase her pregabalin to 100 mg three times daily.  I hope this makes the pain more manageable.  I would also like you to take Tylenol/acetaminophen rather than ibuprofen/Aleve/Advil.  Regarding your breast tenderness I believe that this is most likely related to the medication spironolactone which you are taking.  This is known to cause issues with breast tenderness in men.  It is benign and if it is manageable you can stay on that medication.  If it is unbearable you will need to speak to your cardiologist regarding being switched to another medication.  I will work on making sure your prescriptions are squared away at the pharmacy.  If you have any issues please feel free to call and I will get back in touch with you.  Have a wonderful afternoon!   Knee Injection A knee injection is a procedure to get medicine into your knee joint to relieve the pain, swelling, and stiffness of arthritis. Your health care provider uses a needle to inject medicine, which may also help to lubricate and cushion your knee joint. You may need more than one injection. Tell a health care provider about:  Any allergies you have.  All medicines you are taking, including vitamins, herbs, eye drops, creams, and over-the-counter medicines.  Any problems you or family members have had with anesthetic medicines.  Any blood disorders you have.  Any surgeries you have had.  Any medical conditions you have.  Whether you are pregnant or may be pregnant. What are the risks? Generally, this is a safe procedure. However, problems may occur,  including:  Infection.  Bleeding.  Symptoms that get worse.  Damage to the area around your knee.  Allergic reaction to any of the medicines.  Skin reactions from repeated injections. What happens before the procedure?  Ask your health care provider about changing or stopping your regular medicines. This is especially important if you are taking diabetes medicines or blood thinners.  Plan to have someone take you home from the hospital or clinic. What happens during the procedure?   You will sit or lie down in a position for your knee to be treated.  The skin over your kneecap will be cleaned with a germ-killing soap.  You will be given a medicine that numbs the area (local anesthetic). You may feel some stinging.  The medicine will be injected into your knee. The needle is carefully placed between your kneecap and your knee. The medicine is injected into the joint space.  The needle will be removed at the end of the procedure.  A bandage (dressing) may be placed over the injection site. The procedure may vary among health care providers and hospitals. What can I expect after the procedure?  Your blood pressure, heart rate, breathing rate, and blood oxygen level will be monitored until you leave the hospital or clinic.  You may have to move your knee through its full range of motion. This helps to get all the medicine into your joint space.  You will be watched to make sure that you do not  have a reaction to the injected medicine.  You may feel more pain, swelling, and warmth than you did before the injection. This reaction may last about 1-2 days. Follow these instructions at home: Medicines  Take over-the-counter and prescription medicines only as told by your doctor.  Do not drive or use heavy machinery while taking prescription pain medicine.  Do not take medicines such as aspirin and ibuprofen unless your health care provider tells you to take them. Injection  site care  Follow instructions from your health care provider about: ? How to take care of your puncture site. ? When and how you should change your dressing. ? When you should remove your dressing.  Check your injection area every day for signs of infection. Check for: ? More redness, swelling, or pain after 2 days. ? Fluid or blood. ? Pus or a bad smell. ? Warmth. Managing pain, stiffness, and swelling   If directed, put ice on the injection area: ? Put ice in a plastic bag. ? Place a towel between your skin and the bag. ? Leave the ice on for 20 minutes, 2-3 times per day.  Do not apply heat to your knee.  Raise (elevate) the injection area above the level of your heart while you are sitting or lying down. General instructions  If you were given a dressing, keep it dry until your health care provider says it can be removed. Ask your health care provider when you can start showering or taking a bath.  Avoid strenuous activities for as long as directed by your health care provider. Ask your health care provider when you can return to your normal activities.  Keep all follow-up visits as told by your health care provider. This is important. You may need more injections. Contact a health care provider if you have:  A fever.  Warmth in your injection area.  Fluid, blood, or pus coming from your injection site.  Symptoms at your injection site that last longer than 2 days after your procedure. Get help right away if:  Your knee: ? Turns very red. ? Becomes very swollen. ? Is in severe pain. Summary  A knee injection is a procedure to get medicine into your knee joint to relieve the pain, swelling, and stiffness of arthritis.  A needle is carefully placed between your kneecap and your knee to inject medicine into the joint space.  Before the procedure, ask your health care provider about changing or stopping your regular medicines, especially if you are taking diabetes  medicines or blood thinners.  Contact your health care provider if you have any problems or questions after your procedure. This information is not intended to replace advice given to you by your health care provider. Make sure you discuss any questions you have with your health care provider. Document Revised: 02/28/2017 Document Reviewed: 02/28/2017 Elsevier Patient Education  [place].

## 2019-05-31 NOTE — Assessment & Plan Note (Signed)
Patient reports 3 to 4 months of bilateral breast tenderness with tenderness around the areolas.  Patient denies feeling any masses.  On exam he does not fact have tenderness around areolas bilaterally with no masses palpated no axillary lymphadenopathy or lymphadenopathy noticed on chest.  This is most likely related to his spironolactone. -Counseled on gynecomastia associated with spironolactone -Counseled on strict return precautions -Discussed medication changes but recommended he speak with his cardiologist before changing any medications.

## 2019-05-31 NOTE — Progress Notes (Signed)
SUBJECTIVE:   CHIEF COMPLAINT / HPI:   Left knee pain Patient has history of chronic bilateral knee pain.  He had received an injection in his right knee 3 to 4 weeks ago.  Presents today because he would like to have an injection in his left knee.  Breast tenderness Patient has had breast tenderness for the last 3 to 4 months.  Reports it is around his areola and it is tender to palpation bilaterally.  Patient denies any masses noted or swollen lymph nodes.  No reported history of male breast cancer.  Chronic pain Patient reports his pregabalin is helping some with his nerve pain.  He also reports he has been taking ibuprofen up to 2 g daily for his joint pain.  I discussed with him that he should not do this given his kidney function has been declining.  Patient understands and reports he will stop taking NSAIDs.  I recommended the use of acetaminophen.  PERTINENT  PMH / PSH: Cardiac amyloidosis  OBJECTIVE:   BP 118/60   Pulse 87   Ht 5\' 10"  (1.778 m)   Wt (!) 323 lb 6.4 oz (146.7 kg)   SpO2 98%   BMI 46.40 kg/m   General: Well-appearing, no acute distress, ambulates with a cane Cardio: Regular rate and rhythm, no murmurs appreciated Respiratory: Lungs are clear to auscultation bilaterally, normal work of breathing Abdomen: Soft, nontender, positive bowel sounds MSK: Patient is tender to palpation around areolas bilaterally.  No masses noted. no axillary lymphadenopathy noted.  Left knee has mild effusion.  No erythema noted  Verbal consent obtained and using iodine as well as alcohol rubs knee joint was cleaned.  Steroid 40 mg Depo-Medrol and 2 ml plain Lidocaine was then injected and the needle withdrawn.  The procedure was well tolerated.  The patient is asked to continue to rest the knee for a few more days before resuming regular activities.  It may be more painful for the first 1-2 days.  Watch for fever, or increased swelling or persistent pain in knee. Call or return to  clinic prn if such symptoms occur or the knee fails to improve as anticipated.   ASSESSMENT/PLAN:   Chronic knee pain Patient has history of chronic knee pain.  Received a knee injection on his right knee on 2/26.  Is here to receive injection on his left knee. -Encouraged weight loss and better glucose management -Encouraged exercise and stretching -40 mg Depo-Medrol injected into left knee with 2 mL lidocaine  Breast tenderness in male Patient reports 3 to 4 months of bilateral breast tenderness with tenderness around the areolas.  Patient denies feeling any masses.  On exam he does not fact have tenderness around areolas bilaterally with no masses palpated no axillary lymphadenopathy or lymphadenopathy noticed on chest.  This is most likely related to his spironolactone. -Counseled on gynecomastia associated with spironolactone -Counseled on strict return precautions -Discussed medication changes but recommended he speak with his cardiologist before changing any medications.  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  Patient currently on pregabalin 100 mg twice daily.  He also reports taking up to 2 g of ibuprofen daily which she reports helps.  Given patient's increasing creatinine I discontinued the ibuprofen and recommended acetaminophen instead.  Discussed the use of Neurontin but patient reports that this did not help in the past. -BMP today to assess creatinine -Increased pregabalin to 100 mg  3 times a day -Discontinued ibuprofen -Encouraged use of acetaminophen -Consider Ultram in the future  AKI (acute kidney injury) ([place]) Patient with elevated creatinine on last 2 visits of 1.4 and 1.48.  Baseline prior to that appeared to be around 1.0-1.1.  Unsure if this is new baseline or if patient has AKI.  Patient reports use of up to 2 g of NSAIDs daily. -BMP today to assess creatinine -Consider nephrology  referral  [name], MD [place]

## 2019-05-31 NOTE — Assessment & Plan Note (Signed)
Patient has history of chronic knee pain.  Received a knee injection on his right knee on 2/26.  Is here to receive injection on his left knee. -Encouraged weight loss and better glucose management -Encouraged exercise and stretching -40 mg Depo-Medrol injected into left knee with 2 mL lidocaine

## 2019-05-31 NOTE — Assessment & Plan Note (Signed)
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  Patient currently on pregabalin 100 mg twice daily.  He also reports taking up to 2 g of ibuprofen daily which she reports helps.  Given patient's increasing creatinine I discontinued the ibuprofen and recommended acetaminophen instead.  Discussed the use of Neurontin but patient reports that this did not help in the past. -BMP today to assess creatinine -Increased pregabalin to 100 mg 3 times a day -Discontinued ibuprofen -Encouraged use of acetaminophen -Consider Ultram in the future

## 2019-05-31 NOTE — Assessment & Plan Note (Signed)
Patient with elevated creatinine on last 2 visits of 1.4 and 1.48.  Baseline prior to that appeared to be around 1.0-1.1.  Unsure if this is new baseline or if patient has AKI.  Patient reports use of up to 2 g of NSAIDs daily. -BMP today to assess creatinine -Consider nephrology referral
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
## 2019-05-31 NOTE — Telephone Encounter (Signed)
Telephone contact to assess tobacco cessation progress.  He reports continuing to smoke ~ 7 cigs per day.   Patient reported he could not tolerate the taste of the original nicotine gum he was prescribed when nicotine lozenges.   I agreed to try a new prescription to his pharmacy to request again nicotine lozenges that are covered by [place] [employer].   I sent new prescription to [facility].  I will plan to follow-up in the AM to assess progress to obtain flavored lozenge for this patient .

## 2019-06-01 ENCOUNTER — Ambulatory Visit (HOSPITAL_COMMUNITY): Payer: Medicaid Other

## 2019-06-01 ENCOUNTER — Encounter (HOSPITAL_COMMUNITY)
Admission: RE | Admit: 2019-06-01 | Discharge: 2019-06-01 | Disposition: A | Discharge status: Home / Self Care | Payer: Medicaid Other | Source: Ambulatory Visit | Attending: Internal Medicine | Admitting: Internal Medicine

## 2019-06-01 DIAGNOSIS — Z955 Presence of coronary angioplasty implant and graft: Secondary | ICD-10-CM

## 2019-06-01 LAB — BASIC METABOLIC PANEL
BUN/Creatinine Ratio: 15 (ref 10–24)
BUN: 16 mg/dL (ref 8–27)
CO2: 25 mmol/L (ref 20–29)
Calcium: 8.9 mg/dL (ref 8.6–10.2)
Chloride: 103 mmol/L (ref 96–106)
Creatinine, Ser: 1.08 mg/dL (ref 0.76–1.27)
GFR calc Af Amer: 85 mL/min/{1.73_m2} (ref >59)
GFR calc non Af Amer: 74 mL/min/{1.73_m2} (ref >59)
Glucose: 112 mg/dL — ABNORMAL HIGH (ref 65–99)
Potassium: 4.3 mmol/L (ref 3.5–5.2)
Sodium: 140 mmol/L (ref 134–144)

## 2019-06-01 LAB — GLUCOSE, CAPILLARY
Glucose-Capillary: 98 mg/dL (ref 70–99)
Glucose-Capillary: 101 mg/dL — ABNORMAL HIGH (ref 70–99)

## 2019-06-01 NOTE — Progress Notes (Signed)
Daily Session Note  Patient Details  Name: Russell Collier MRN: [id number] Date of Birth: Jul 05, 1960 Referring Provider:     [facility] from 05/24/2019 in [place]  Referring Provider  Glori Bickers MD      Encounter Date: 06/01/2019  Check In:   Capillary Blood Glucose: No results found for this or any previous visit (from the past 24 hour(s)).    Social History   Tobacco Use  Smoking Status Current Every Day Smoker  . Packs/day: 0.50  . Years: 54.00  . Pack years: 27.00  . Types: Cigarettes, Cigars  Smokeless Tobacco Never Used    Goals Met:  No report of cardiac concerns or symptoms  Goals Unmet:  pain/exercise tolerance/weights not completed secondary to deconditioning and lower extremity discomfort  Comments: Pt started cardiac rehab today.  Pt tolerated light exercise however had to take multiple sitting stretch breaks to relieve lower extremity neuropathy pain 8/10. VSS, telemetry-NSR, asymptomatic.  Medication list reconciled. Pt denies barriers to medicaiton compliance.  PSYCHOSOCIAL ASSESSMENT:  PHQ-. Pt exhibits positive coping skills, hopeful outlook with supportive family. No psychosocial needs identified at this time, no psychosocial interventions necessary.  Pt oriented to exercise equipment and routine. Understanding verbalized.   Dr. Fransico Him is Medical Director for Cardiac Rehab at [facility].

## 2019-06-04 ENCOUNTER — Ambulatory Visit (HOSPITAL_COMMUNITY): Payer: Medicaid Other

## 2019-06-04 ENCOUNTER — Encounter (HOSPITAL_COMMUNITY)
Admission: RE | Admit: 2019-06-04 | Discharge: 2019-06-04 | Disposition: A | Discharge status: Home / Self Care | Payer: Medicaid Other | Source: Ambulatory Visit | Attending: Internal Medicine | Admitting: Internal Medicine

## 2019-06-04 ENCOUNTER — Other Ambulatory Visit: Payer: Self-pay

## 2019-06-04 DIAGNOSIS — Z955 Presence of coronary angioplasty implant and graft: Secondary | ICD-10-CM

## 2019-06-04 LAB — GLUCOSE, CAPILLARY
Glucose-Capillary: 128 mg/dL — ABNORMAL HIGH (ref 70–99)
Glucose-Capillary: 116 mg/dL — ABNORMAL HIGH (ref 70–99)

## 2019-06-05 ENCOUNTER — Other Ambulatory Visit (HOSPITAL_COMMUNITY): Payer: Self-pay

## 2019-06-05 ENCOUNTER — Ambulatory Visit: Payer: Self-pay

## 2019-06-05 ENCOUNTER — Encounter: Payer: Self-pay | Admitting: Physician Assistant

## 2019-06-05 ENCOUNTER — Ambulatory Visit (INDEPENDENT_AMBULATORY_CARE_PROVIDER_SITE_OTHER): Payer: Medicaid Other | Admitting: Physician Assistant

## 2019-06-05 VITALS — Ht 70.0 in | Wt 323.0 lb

## 2019-06-05 DIAGNOSIS — M1711 Unilateral primary osteoarthritis, right knee: Secondary | ICD-10-CM | POA: Diagnosis not present

## 2019-06-05 DIAGNOSIS — G8929 Other chronic pain: Secondary | ICD-10-CM | POA: Diagnosis not present

## 2019-06-05 DIAGNOSIS — M25561 Pain in right knee: Secondary | ICD-10-CM | POA: Diagnosis not present

## 2019-06-05 DIAGNOSIS — M25562 Pain in left knee: Secondary | ICD-10-CM | POA: Diagnosis not present

## 2019-06-05 DIAGNOSIS — M545 Low back pain: Secondary | ICD-10-CM

## 2019-06-05 MED ORDER — PREDNISONE 50 MG PO TABS: ORAL_TABLET | ORAL | 0 refills | Status: DC
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
## 2019-06-05 NOTE — Progress Notes (Signed)
Paramedicine Encounter    Patient ID: Russell Collier, male    DOB: 1960/03/23, 59 y.o.   MRN: [id number]   Patient Care Team: [name], MD as PCP - General (Family Medicine) [name], MD as PCP - Cardiology (Cardiology) Michael Boston, MD as Consulting Physician (General Surgery) Wilford Corner, MD as Consulting Physician (Gastroenterology) Jorge Ny, LCSW as Social Worker (Licensed Clinical Social Worker)  Patient Active Problem List   Diagnosis Date Noted  . Breast tenderness in male 05/31/2019  . Chronic pain 05/31/2019  . AKI (acute kidney injury) ([place]) 05/31/2019  . Pain due to onychomycosis of toenails of both feet 05/16/2019  . Chronic knee pain 04/16/2019  . Diabetes ([place]) 04/16/2019  . Esophageal reflux 02/06/2019  . Heartburn 02/06/2019  . Chronic skin ulcer of lower leg ([place]) 12/21/2018  . S/P drug eluting coronary stent placement   . Coronary artery disease involving native coronary artery of native heart with unstable angina pectoris ([place]) 12/20/2018  . Unstable angina ([place])   . 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 Acute respiratory distress 10/16/2018  . Acute on chronic diastolic heart failure ([place])   . Shortness of breath   . Precordial chest pain   . Idiopathic peripheral neuropathy   . Palpitations   . Chronic diastolic heart failure ([place])   . Abnormal ankle brachial index (ABI)   . Chest pain, rule out acute myocardial infarction 09/26/2018  . Dyspepsia   . Morbid obesity ([place])   . OSA on CPAP   . Chest pain 05/30/2018  . CAD (coronary artery disease) 03/30/2018  . Elevated troponin 02/03/2018  . Positive urine drug screen 02/03/2018  . Tobacco abuse 02/03/2018  . Hyperlipidemia 02/03/2018  . Acute respiratory failure with hypoxia ([place]) 02/02/2018  . Acute congestive heart failure ([place])   . Acute bronchitis 12/28/2015  . Atypical chest pain 12/27/2015  . Essential hypertension   . Neuropathy   . Viral illness     Current Outpatient  Medications:  .  albuterol (PROVENTIL) (2.5 MG/3ML) 0.083% nebulizer solution, Take 2.5 mg by nebulization every 6 (six) hours as needed for wheezing or shortness of breath., Disp: , Rfl:  .  albuterol (VENTOLIN HFA) 108 (90 Base) MCG/ACT inhaler, Inhale 2 puffs into the lungs every 6 (six) hours as needed for wheezing or shortness of breath., Disp: , Rfl:  .  atorvastatin (LIPITOR) 80 MG tablet, Take 1 tablet (80 mg total) by mouth daily., Disp: 90 tablet, Rfl: 3 .  clopidogrel (PLAVIX) 75 MG tablet, Take 1 tablet (75 mg total) by mouth daily with breakfast., Disp: 90 tablet, Rfl: 3 .  dapagliflozin propanediol (FARXIGA) 10 MG TABS tablet, Take 10 mg by mouth daily before breakfast., Disp: 30 tablet, Rfl: 6 .  enalapril (VASOTEC) 20 MG tablet, Take 20 mg by mouth 2 (two) times daily., Disp: , Rfl:  .  fluticasone (FLONASE) 50 MCG/ACT nasal spray, Place 1 spray into both nostrils daily., Disp: 18.2 mL, Rfl: 2 .  furosemide (LASIX) 40 MG tablet, Take 1 tablet (40 mg total) by mouth 2 (two) times daily., Disp: 180 tablet, Rfl: 3 .  isosorbide mononitrate (IMDUR) 30 MG 24 hr tablet, Take 1 tablet (30 mg total) by mouth daily., Disp: 30 tablet, Rfl: 0 .  Multiple Vitamin (MULTIVITAMIN WITH MINERALS) TABS tablet, Take 1 tablet by mouth daily., Disp: , Rfl:  .  nicotine polacrilex (GNP NICOTINE POLACRILEX) 4 MG lozenge, Take 1 lozenge (4 mg total) by mouth as needed for smoking  cessation., Disp: 100 tablet, Rfl: 3 .  pantoprazole (PROTONIX) 40 MG tablet, Take 1 tablet (40 mg total) by mouth daily., Disp: 90 tablet, Rfl: 0 .  potassium chloride SA (KLOR-CON) 20 MEQ tablet, TAKE 1 TABLET(20 MEQ) BY MOUTH DAILY, Disp: 90 tablet, Rfl: 3 .  pregabalin (LYRICA) 100 MG capsule, Take 1 tablet three times daily, Disp: 270 capsule, Rfl: 3 .  spironolactone (ALDACTONE) 25 MG tablet, Take 0.5 tablets (12.5 mg total) by mouth daily., Disp: 45 tablet, Rfl: 3 .  Tafamidis (VYNDAMAX) 61 MG CAPS, Take 61 mg by mouth  daily., Disp: , Rfl:  .  valACYclovir (VALTREX) 500 MG tablet, Take 500 mg by mouth as needed. , Disp: , Rfl:  .  aspirin 81 MG chewable tablet, Chew 1 tablet (81 mg total) by mouth daily., Disp: 90 tablet, Rfl: 3 .  predniSONE (DELTASONE) 50 MG tablet, Take one tablet by mouth once daily for 5 days., Disp: 5 tablet, Rfl: 0 Allergies  Allergen Reactions  . Varenicline Other (See Comments)    Patient reports laryngospasm stopped in the ER     Social History   Socioeconomic History  . Marital status: Divorced    Spouse name: Not on file  . Number of children: Not on file  . Years of education: Not on file  . Highest education level: Not on file  Occupational History  . Not on file  Tobacco Use  . Smoking status: Current Every Day Smoker    Packs/day: 0.50    Years: 54.00    Pack years: 27.00    Types: Cigarettes, Cigars  . Smokeless tobacco: Never Used  Substance and Sexual Activity  . Alcohol use: Yes    Alcohol/week: 1.0 standard drinks    Types: 1 Cans of beer per week  . Drug use: Yes    Comment: last use may last year  . Sexual activity: Yes  Other Topics Concern  . Not on file  Social History Narrative  . Not on file   Social Determinants of Health   Financial Resource Strain: Low Risk   . Difficulty of Paying Living Expenses: Not very hard  Food Insecurity: No Food Insecurity  . Worried About Charity fundraiser in the Last Year: Never true  . Ran Out of Food in the Last Year: Never true  Transportation Needs: No Transportation Needs  . Lack of Transportation (Medical): No  . Lack of Transportation (Non-Medical): No  Physical Activity: Insufficiently Active  . Days of Exercise per Week: 2 days  . Minutes of Exercise per Session: 20 min  Stress: No Stress Concern Present  . Feeling of Stress : Not at all  Social Connections: Somewhat Isolated  . Frequency of Communication with Friends and Family: More than three times a week  . Frequency of Social  Gatherings with Friends and Family: Twice a week  . Attends Religious Services: 1 to 4 times per year  . Active Member of Clubs or Organizations: No  . Attends Archivist Meetings: Never  . Marital Status: Divorced  Human resources officer Violence: Not At Risk  . Fear of Current or Ex-Partner: No  . Emotionally Abused: No  . Physically Abused: No  . Sexually Abused: No    Physical Exam Vitals reviewed.  Constitutional:      Appearance: He is normal weight.  HENT:     Head: Normocephalic.     Nose: Nose normal.     Mouth/Throat:     Mouth:  Mucous membranes are moist.  Eyes:     Pupils: Pupils are equal, round, and reactive to light.  Cardiovascular:     Rate and Rhythm: Normal rate and regular rhythm.     Pulses: Normal pulses.     Heart sounds: Normal heart sounds.  Pulmonary:     Effort: Pulmonary effort is normal.     Breath sounds: Normal breath sounds.  Abdominal:     General: Abdomen is flat.     Palpations: Abdomen is soft.  Musculoskeletal:        General: Normal range of motion.     Right lower leg: No edema.     Left lower leg: No edema.  Skin:    General: Skin is warm and dry.     Capillary Refill: Capillary refill takes less than 2 seconds.  Neurological:     Mental Status: He is alert. Mental status is at baseline.  Psychiatric:        Mood and Affect: Mood normal.     Arrived for home visit for [name] who was seated on a chair in his home alert and oriented with no complaints other than his chronic knee pain. [place] denied shortness of breath, chest pain, dizziness or N/V/D. Vitals were obtained as noted in report. Medications were reviewed and confirmed. Pill box was filled accordingly.   Medications noted needed for refill: -Pantoprazole (too early per pharmacy) -Flonase (needs new RX sent to [facility]) -Valtrex (needs new RX sent to [facility]) -ASA (pt will pick up OTC)    Home visit complete. I will follow up with patient in one week.      Future Appointments  Date Time Provider [place]  06/06/2019 11:15 AM [facility] PHASE II EXC [facility] None  06/08/2019 11:15 AM [facility] PHASE II EXC [facility] None  06/11/2019 10:50 AM Posey Pronto, Donika K, DO [facility] None  06/11/2019 11:15 AM [facility] PHASE II EXC [facility] None  06/13/2019 11:15 AM [facility] PHASE II EXC [facility] None  06/15/2019 11:15 AM [facility] PHASE II EXC [facility] None  06/18/2019 11:15 AM [facility] PHASE II EXC [facility] None  06/19/2019 11:25 AM MC-SCREENING [facility] None  06/20/2019 11:15 AM [facility] PHASE II EXC [facility] None  06/21/2019  8:00 PM [name], MD [facility]  06/22/2019 11:15 AM [facility] PHASE II EXC [facility] None  06/25/2019 11:15 AM [facility] PHASE II EXC [facility] None  06/27/2019 11:15 AM [facility] PHASE II EXC [facility] None  06/29/2019 11:15 AM [facility] PHASE II EXC [facility] None  07/02/2019 11:15 AM [facility] PHASE II EXC [facility] None  07/04/2019 11:15 AM [facility] PHASE II EXC [facility] None  07/06/2019 11:15 AM [facility] PHASE II EXC [facility] None  07/09/2019 11:15 AM [facility] PHASE II EXC [facility] None  07/11/2019 11:15 AM [facility] PHASE II EXC [facility] None  07/13/2019 11:15 AM [facility] PHASE II EXC [facility] None  07/16/2019 11:15 AM [facility] PHASE II EXC [facility] None  07/18/2019 11:15 AM [facility] PHASE II EXC [facility] None  07/20/2019 11:15 AM [facility] PHASE II EXC [facility] None  08/03/2019  9:40 AM [name], [name], MD [facility] None  08/21/2019  3:15 PM Gardiner Barefoot, DPM [facility]     ACTION: Home visit completed Next visit planned for one week

## 2019-06-06 ENCOUNTER — Ambulatory Visit (HOSPITAL_COMMUNITY): Payer: Medicaid Other

## 2019-06-06 ENCOUNTER — Other Ambulatory Visit: Payer: Self-pay

## 2019-06-06 ENCOUNTER — Encounter (HOSPITAL_COMMUNITY)
Admission: RE | Admit: 2019-06-06 | Discharge: 2019-06-06 | Disposition: A | Discharge status: Home / Self Care | Payer: Medicaid Other | Source: Ambulatory Visit | Attending: Internal Medicine | Admitting: Internal Medicine

## 2019-06-06 DIAGNOSIS — G8929 Other chronic pain: Secondary | ICD-10-CM | POA: Diagnosis not present

## 2019-06-06 DIAGNOSIS — M25562 Pain in left knee: Secondary | ICD-10-CM | POA: Diagnosis not present

## 2019-06-06 DIAGNOSIS — Z955 Presence of coronary angioplasty implant and graft: Secondary | ICD-10-CM | POA: Diagnosis not present

## 2019-06-06 DIAGNOSIS — M545 Low back pain: Secondary | ICD-10-CM | POA: Diagnosis not present

## 2019-06-06 DIAGNOSIS — M1711 Unilateral primary osteoarthritis, right knee: Secondary | ICD-10-CM | POA: Diagnosis not present

## 2019-06-06 DIAGNOSIS — M25561 Pain in right knee: Secondary | ICD-10-CM | POA: Diagnosis not present

## 2019-06-06 LAB — GLUCOSE, CAPILLARY: Glucose-Capillary: 125 mg/dL — ABNORMAL HIGH (ref 70–99)

## 2019-06-06 MED ORDER — LIDOCAINE HCL 1 % IJ SOLN
5.0000 mL | INTRAMUSCULAR | Status: AC | PRN
  Administered 2019-06-06: 5 mL

## 2019-06-06 MED ORDER — METHYLPREDNISOLONE ACETATE 40 MG/ML IJ SUSP
40.0000 mg | INTRAMUSCULAR | Status: AC | PRN
  Administered 2019-06-06: 40 mg via INTRA_ARTICULAR
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
## 2019-06-08 ENCOUNTER — Ambulatory Visit (HOSPITAL_COMMUNITY): Payer: Medicaid Other

## 2019-06-08 ENCOUNTER — Telehealth: Payer: Self-pay | Admitting: Pharmacist

## 2019-06-08 ENCOUNTER — Encounter (HOSPITAL_COMMUNITY)
Admission: RE | Admit: 2019-06-08 | Discharge: 2019-06-08 | Disposition: A | Discharge status: Home / Self Care | Payer: Medicaid Other | Source: Ambulatory Visit | Attending: Internal Medicine | Admitting: Internal Medicine

## 2019-06-08 ENCOUNTER — Other Ambulatory Visit: Payer: Self-pay

## 2019-06-08 DIAGNOSIS — Z955 Presence of coronary angioplasty implant and graft: Secondary | ICD-10-CM

## 2019-06-08 MED ORDER — NICOTINE POLACRILEX 4 MG MT LOZG: 4.0000 mg | LOZENGE | OROMUCOSAL | 2 refills | Status: DC | PRN

## 2019-06-08 NOTE — Telephone Encounter (Signed)
Contacted pharmacy to clarify dispensing of NRT Lozenges.    Clarified need for lozenges as patient only has 4 teeth.  Patient has been intolerant of nicotine patches and dislikes the taste of original gum.   Resent new prescription to pharmacy.   Contacted patient to discuss options.   He has tried lozenges and he does not like the taste of nicotine lozenges (even mint flavor). His plan is to  Gradually reduce intake and try to eventually quit completely.   I will plan to contact him in 2 weeks and reassess PROGRESS from 7-8 per day currently (not on any cessation therapy).

## 2019-06-09 ENCOUNTER — Other Ambulatory Visit: Payer: Self-pay | Admitting: Family Medicine

## 2019-06-09 MED ORDER — FLUTICASONE PROPIONATE 50 MCG/ACT NA SUSP: 1.0000 | Freq: Every day | NASAL | 2 refills | Status: DC

## 2019-06-09 MED ORDER — VALACYCLOVIR HCL 500 MG PO TABS: 500.0000 mg | ORAL_TABLET | ORAL | 3 refills | Status: DC | PRN

## 2019-06-11 ENCOUNTER — Telehealth (HOSPITAL_COMMUNITY): Payer: Self-pay | Admitting: Licensed Clinical Social Worker

## 2019-06-11 ENCOUNTER — Encounter (HOSPITAL_COMMUNITY): Payer: Medicaid Other

## 2019-06-11 ENCOUNTER — Encounter: Payer: Self-pay | Admitting: Neurology

## 2019-06-11 ENCOUNTER — Ambulatory Visit (HOSPITAL_COMMUNITY): Payer: Medicaid Other

## 2019-06-11 ENCOUNTER — Other Ambulatory Visit: Payer: Self-pay

## 2019-06-11 ENCOUNTER — Ambulatory Visit (INDEPENDENT_AMBULATORY_CARE_PROVIDER_SITE_OTHER): Payer: Medicaid Other | Admitting: Neurology

## 2019-06-11 VITALS — BP 134/73 | HR 84 | Resp 20 | Ht 70.0 in | Wt 320.0 lb

## 2019-06-11 DIAGNOSIS — G63 Polyneuropathy in diseases classified elsewhere: Secondary | ICD-10-CM

## 2019-06-11 DIAGNOSIS — E851 Neuropathic heredofamilial amyloidosis: Secondary | ICD-10-CM | POA: Diagnosis not present

## 2019-06-11 DIAGNOSIS — G5603 Carpal tunnel syndrome, bilateral upper limbs: Secondary | ICD-10-CM | POA: Diagnosis not present

## 2019-06-11 NOTE — Telephone Encounter (Signed)
CSW informed by [employer] that pt having trouble getting transportation to sleep study appt next week as Medicaid transport won't take him in the evening.  Pt has a sister who he is waiting to see if can take him to his appt.  CSW made sure pt had my number and encouraged him to reach out if he is unable to get a ride.  CSW also placed reminder for me to reach out to him the day of his procedure to confirm transport.  CSW will continue to follow and assist as needed  Jorge Ny, [facility] Desk#: [phone] Cell#: [phone]
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
## 2019-06-11 NOTE — Patient Instructions (Signed)
Return to clinic in 1 year.

## 2019-06-12 ENCOUNTER — Other Ambulatory Visit (HOSPITAL_COMMUNITY): Payer: Self-pay

## 2019-06-12 NOTE — Progress Notes (Signed)
Paramedicine Encounter    Patient ID: Russell Collier, male    DOB: 1961-01-19, 59 y.o.   MRN: [id number]   Patient Care Team: [name], MD as PCP - General (Family Medicine) [name], MD as PCP - Cardiology (Cardiology) Michael Boston, MD as Consulting Physician (General Surgery) Wilford Corner, MD as Consulting Physician (Gastroenterology) Jorge Ny, LCSW as Social Worker (Licensed Clinical Social Worker) Alda Berthold, DO as Consulting Physician (Neurology)  Patient Active Problem List   Diagnosis Date Noted  . Breast tenderness in male 05/31/2019  . Chronic pain 05/31/2019  . AKI (acute kidney injury) ([place]) 05/31/2019  . Pain due to onychomycosis of toenails of both feet 05/16/2019  . Chronic knee pain 04/16/2019  . Diabetes ([place]) 04/16/2019  . Esophageal reflux 02/06/2019  . Heartburn 02/06/2019  . Chronic skin ulcer of lower leg ([place]) 12/21/2018  . S/P drug eluting coronary stent placement   . Coronary artery disease involving native coronary artery of native heart with unstable angina pectoris ([place]) 12/20/2018  . Unstable angina ([place])   . 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 Acute respiratory distress 10/16/2018  . Acute on chronic diastolic heart failure ([place])   . Shortness of breath   . Precordial chest pain   . Idiopathic peripheral neuropathy   . Palpitations   . Chronic diastolic heart failure ([place])   . Abnormal ankle brachial index (ABI)   . Chest pain, rule out acute myocardial infarction 09/26/2018  . Dyspepsia   . Morbid obesity ([place])   . OSA on CPAP   . Chest pain 05/30/2018  . CAD (coronary artery disease) 03/30/2018  . Elevated troponin 02/03/2018  . Positive urine drug screen 02/03/2018  . Tobacco abuse 02/03/2018  . Hyperlipidemia 02/03/2018  . Acute respiratory failure with hypoxia ([place]) 02/02/2018  . Acute congestive heart failure ([place])   . Acute bronchitis 12/28/2015  . Atypical chest pain 12/27/2015  . Essential hypertension   .  Neuropathy   . Viral illness     Current Outpatient Medications:  .  albuterol (PROVENTIL) (2.5 MG/3ML) 0.083% nebulizer solution, Take 2.5 mg by nebulization every 6 (six) hours as needed for wheezing or shortness of breath., Disp: , Rfl:  .  albuterol (VENTOLIN HFA) 108 (90 Base) MCG/ACT inhaler, Inhale 2 puffs into the lungs every 6 (six) hours as needed for wheezing or shortness of breath., Disp: , Rfl:  .  atorvastatin (LIPITOR) 80 MG tablet, Take 1 tablet (80 mg total) by mouth daily., Disp: 90 tablet, Rfl: 3 .  clopidogrel (PLAVIX) 75 MG tablet, Take 1 tablet (75 mg total) by mouth daily with breakfast., Disp: 90 tablet, Rfl: 3 .  dapagliflozin propanediol (FARXIGA) 10 MG TABS tablet, Take 10 mg by mouth daily before breakfast., Disp: 30 tablet, Rfl: 6 .  enalapril (VASOTEC) 20 MG tablet, Take 20 mg by mouth 2 (two) times daily., Disp: , Rfl:  .  fluticasone (FLONASE) 50 MCG/ACT nasal spray, Place 1 spray into both nostrils daily., Disp: 18.2 mL, Rfl: 2 .  furosemide (LASIX) 40 MG tablet, Take 1 tablet (40 mg total) by mouth 2 (two) times daily., Disp: 180 tablet, Rfl: 3 .  isosorbide mononitrate (IMDUR) 30 MG 24 hr tablet, Take 1 tablet (30 mg total) by mouth daily., Disp: 30 tablet, Rfl: 0 .  Multiple Vitamin (MULTIVITAMIN WITH MINERALS) TABS tablet, Take 1 tablet by mouth daily., Disp: , Rfl:  .  potassium chloride SA (KLOR-CON) 20 MEQ tablet, TAKE 1 TABLET(20 MEQ) BY  MOUTH DAILY, Disp: 90 tablet, Rfl: 3 .  pregabalin (LYRICA) 100 MG capsule, Take 1 tablet three times daily, Disp: 270 capsule, Rfl: 3 .  spironolactone (ALDACTONE) 25 MG tablet, Take 0.5 tablets (12.5 mg total) by mouth daily., Disp: 45 tablet, Rfl: 3 .  Tafamidis (VYNDAMAX) 61 MG CAPS, Take 61 mg by mouth daily., Disp: , Rfl:  .  valACYclovir (VALTREX) 500 MG tablet, Take 1 tablet (500 mg total) by mouth as needed (Take 500 mg twice daily for 3 days when having an outbreak.)., Disp: 6 tablet, Rfl: 3 .  aspirin 81 MG  chewable tablet, Chew 1 tablet (81 mg total) by mouth daily., Disp: 90 tablet, Rfl: 3 .  nicotine polacrilex (SM NICOTINE POLACRILEX) 4 MG lozenge, Take 1 lozenge (4 mg total) by mouth as needed for smoking cessation. (Patient not taking: Reported on 06/12/2019), Disp: 100 tablet, Rfl: 2 .  pantoprazole (PROTONIX) 40 MG tablet, Take 1 tablet (40 mg total) by mouth daily., Disp: 90 tablet, Rfl: 0 .  predniSONE (DELTASONE) 50 MG tablet, Take one tablet by mouth once daily for 5 days. (Patient not taking: Reported on 06/12/2019), Disp: 5 tablet, Rfl: 0 Allergies  Allergen Reactions  . Varenicline Other (See Comments)    Patient reports laryngospasm stopped in the ER     Social History   Socioeconomic History  . Marital status: Divorced    Spouse name: Not on file  . Number of children: 2  . Years of education: 10  . Highest education level: Not on file  Occupational History  . Occupation: not employed  Tobacco Use  . Smoking status: Current Every Day Smoker    Packs/day: 0.50    Years: 54.00    Pack years: 27.00    Types: Cigarettes, Cigars  . Smokeless tobacco: Never Used  Substance and Sexual Activity  . Alcohol use: Yes    Alcohol/week: 1.0 standard drinks    Types: 1 Cans of beer per week  . Drug use: Yes    Comment: last use may last year  . Sexual activity: Yes  Other Topics Concern  . Not on file  Social History Narrative   Right handed   Two story home   No caffeine   Social Determinants of Health   Financial Resource Strain: Low Risk   . Difficulty of Paying Living Expenses: Not very hard  Food Insecurity: No Food Insecurity  . Worried About Charity fundraiser in the Last Year: Never true  . Ran Out of Food in the Last Year: Never true  Transportation Needs: No Transportation Needs  . Lack of Transportation (Medical): No  . Lack of Transportation (Non-Medical): No  Physical Activity: Insufficiently Active  . Days of Exercise per Week: 2 days  . Minutes of  Exercise per Session: 20 min  Stress: No Stress Concern Present  . Feeling of Stress : Not at all  Social Connections: Somewhat Isolated  . Frequency of Communication with Friends and Family: More than three times a week  . Frequency of Social Gatherings with Friends and Family: Twice a week  . Attends Religious Services: 1 to 4 times per year  . Active Member of Clubs or Organizations: No  . Attends Archivist Meetings: Never  . Marital Status: Divorced  Human resources officer Violence: Not At Risk  . Fear of Current or Ex-Partner: No  . Emotionally Abused: No  . Physically Abused: No  . Sexually Abused: No    Physical Exam  Vitals reviewed.  Constitutional:      Appearance: He is normal weight.  HENT:     Head: Normocephalic.     Nose: Nose normal.     Mouth/Throat:     Mouth: Mucous membranes are moist.  Eyes:     Pupils: Pupils are equal, round, and reactive to light.  Cardiovascular:     Rate and Rhythm: Normal rate and regular rhythm.     Pulses: Normal pulses.     Heart sounds: Normal heart sounds.  Pulmonary:     Effort: Pulmonary effort is normal.     Breath sounds: Normal breath sounds.  Abdominal:     Palpations: Abdomen is soft.  Musculoskeletal:        General: Normal range of motion.     Cervical back: Normal range of motion.     Right lower leg: No edema.     Left lower leg: No edema.  Skin:    General: Skin is warm and dry.     Capillary Refill: Capillary refill takes less than 2 seconds.  Neurological:     Mental Status: He is alert.  Psychiatric:        Mood and Affect: Mood normal.    Arrived for home visit for Russell Collier who was alert and oriented seated at his kitchen table stating he has been feeling okay but having some increased heart burn because he has been without his Pantoprazole for a few days. Still too early to refill per [facility]. Patient reports he will take OTC Prevacid for same until he gets his RX. Vitals obtained and  as noted. No edema. Russell Collier reports bilateral breast pain and he reported same to his PCP and they suggested it could be because of the spironolactone. I will forward same to Amy, PA. Russell Collier was able to work out transportation for his sleep study and will be attending same. Russell Collier continues to go to cardiac rehab with positive outcomes. Russell Collier medicines were reviewed and confirmed. Pill box filled. Home visit complete, I will see patient in one week.    Refills: Clopidegrel Potassium Pregabalin  Farxiga   ASA (OTC)   Weight this week- 315lbs Weight last week- 320lbs  CBG- 156    Future Appointments  Date Time Provider [place]  06/13/2019 11:15 AM [facility] PHASE II EXC [facility] None  06/15/2019 11:15 AM [facility] PHASE II EXC [facility] None  06/18/2019 11:15 AM [facility] PHASE II EXC [facility] None  06/19/2019 11:25 AM MC-SCREENING [facility] None  06/20/2019 11:15 AM [facility] PHASE II EXC [facility] None  06/21/2019  8:00 PM [name], MD [facility]  06/22/2019 11:15 AM [facility] PHASE II EXC [facility] None  06/25/2019 11:15 AM [facility] PHASE II EXC [facility] None  06/27/2019 11:15 AM [facility] PHASE II EXC [facility] None  06/29/2019 11:15 AM [facility] PHASE II EXC [facility] None  07/02/2019 11:15 AM [facility] PHASE II EXC [facility] None  07/04/2019 11:15 AM [facility] PHASE II EXC [facility] None  07/06/2019 11:15 AM [facility] PHASE II EXC [facility] None  07/09/2019 11:15 AM [facility] PHASE II EXC [facility] None  07/11/2019 11:15 AM [facility] PHASE II EXC [facility] None  07/13/2019 11:15 AM [facility] PHASE II EXC [facility] None  07/16/2019 11:15 AM [facility] PHASE II EXC [facility] None  07/18/2019 11:15 AM [facility] PHASE II EXC [facility] None  07/20/2019 11:15 AM [facility] PHASE II EXC [facility] None  08/03/2019  9:40 AM [name], [name], MD [facility] None  08/21/2019  3:15  PM Gardiner Barefoot, DPM [facility]     ACTION: Home visit completed Next visit planned for one week

## 2019-06-13 ENCOUNTER — Ambulatory Visit (HOSPITAL_COMMUNITY): Payer: Medicaid Other

## 2019-06-13 ENCOUNTER — Other Ambulatory Visit: Payer: Self-pay

## 2019-06-13 ENCOUNTER — Encounter (HOSPITAL_COMMUNITY)
Admission: RE | Admit: 2019-06-13 | Discharge: 2019-06-13 | Disposition: A | Discharge status: Home / Self Care | Payer: Medicaid Other | Source: Ambulatory Visit | Attending: Internal Medicine | Admitting: Internal Medicine

## 2019-06-13 DIAGNOSIS — Z955 Presence of coronary angioplasty implant and graft: Secondary | ICD-10-CM

## 2019-06-15 ENCOUNTER — Ambulatory Visit (HOSPITAL_COMMUNITY): Payer: Medicaid Other

## 2019-06-15 ENCOUNTER — Encounter (HOSPITAL_COMMUNITY)
Admission: RE | Admit: 2019-06-15 | Discharge: 2019-06-15 | Disposition: A | Discharge status: Home / Self Care | Payer: Medicaid Other | Source: Ambulatory Visit | Attending: Internal Medicine | Admitting: Internal Medicine

## 2019-06-15 ENCOUNTER — Other Ambulatory Visit: Payer: Self-pay

## 2019-06-15 DIAGNOSIS — Z955 Presence of coronary angioplasty implant and graft: Secondary | ICD-10-CM | POA: Diagnosis not present

## 2019-06-18 ENCOUNTER — Other Ambulatory Visit: Payer: Self-pay | Admitting: Family Medicine

## 2019-06-18 ENCOUNTER — Ambulatory Visit (HOSPITAL_COMMUNITY): Payer: Medicaid Other

## 2019-06-18 ENCOUNTER — Other Ambulatory Visit: Payer: Self-pay

## 2019-06-18 ENCOUNTER — Encounter (HOSPITAL_COMMUNITY)
Admission: RE | Admit: 2019-06-18 | Discharge: 2019-06-18 | Disposition: A | Discharge status: Home / Self Care | Payer: Medicaid Other | Source: Ambulatory Visit | Attending: Internal Medicine | Admitting: Internal Medicine

## 2019-06-18 DIAGNOSIS — Z955 Presence of coronary angioplasty implant and graft: Secondary | ICD-10-CM | POA: Diagnosis not present

## 2019-06-18 LAB — GLUCOSE, CAPILLARY: Glucose-Capillary: 91 mg/dL (ref 70–99)

## 2019-06-18 MED ORDER — VALACYCLOVIR HCL 500 MG PO TABS: 500.0000 mg | ORAL_TABLET | Freq: Every day | ORAL | 3 refills | Status: DC

## 2019-06-18 MED FILL — VYNDAMAX 61 MG CAPS: 61 | 30 days supply | Qty: 30 | Fill #6

## 2019-06-18 NOTE — Progress Notes (Signed)
Spoke with Jeris Penta who helps fill patient's pillbox.  Patient is requesting Valtrex daily which he says he has been taking for suppression.  Prescription sent to [facility] pharmacy

## 2019-06-19 ENCOUNTER — Other Ambulatory Visit (HOSPITAL_COMMUNITY): Payer: Self-pay

## 2019-06-19 ENCOUNTER — Other Ambulatory Visit (HOSPITAL_COMMUNITY)
Admission: RE | Admit: 2019-06-19 | Discharge: 2019-06-19 | Disposition: A | Discharge status: Home / Self Care | Payer: Medicaid Other | Source: Ambulatory Visit | Attending: Cardiology | Admitting: Cardiology

## 2019-06-19 DIAGNOSIS — Z01812 Encounter for preprocedural laboratory examination: Secondary | ICD-10-CM | POA: Diagnosis not present

## 2019-06-19 DIAGNOSIS — Z20822 Contact with and (suspected) exposure to covid-19: Secondary | ICD-10-CM | POA: Insufficient documentation

## 2019-06-19 LAB — SARS CORONAVIRUS 2 (TAT 6-24 HRS): SARS Coronavirus 2: NEGATIVE

## 2019-06-19 NOTE — Progress Notes (Signed)
Paramedicine Encounter    Patient ID: Russell Collier, male    DOB: 1960/07/02, 59 y.o.   MRN: [id number]   Patient Care Team: [name], MD as PCP - General (Family Medicine) [name], MD as PCP - Cardiology (Cardiology) Michael Boston, MD as Consulting Physician (General Surgery) Wilford Corner, MD as Consulting Physician (Gastroenterology) Jorge Ny, LCSW as Social Worker (Licensed Clinical Social Worker) Alda Berthold, DO as Consulting Physician (Neurology)  Patient Active Problem List   Diagnosis Date Noted  . Breast tenderness in male 05/31/2019  . Chronic pain 05/31/2019  . AKI (acute kidney injury) ([place]) 05/31/2019  . Pain due to onychomycosis of toenails of both feet 05/16/2019  . Chronic knee pain 04/16/2019  . Diabetes ([place]) 04/16/2019  . Esophageal reflux 02/06/2019  . Heartburn 02/06/2019  . Chronic skin ulcer of lower leg ([place]) 12/21/2018  . S/P drug eluting coronary stent placement   . Coronary artery disease involving native coronary artery of native heart with unstable angina pectoris ([place]) 12/20/2018  . Unstable angina ([place])   . 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 Acute respiratory distress 10/16/2018  . Acute on chronic diastolic heart failure ([place])   . Shortness of breath   . Precordial chest pain   . Idiopathic peripheral neuropathy   . Palpitations   . Chronic diastolic heart failure ([place])   . Abnormal ankle brachial index (ABI)   . Chest pain, rule out acute myocardial infarction 09/26/2018  . Dyspepsia   . Morbid obesity ([address] [address])   . OSA on CPAP   . Chest pain 05/30/2018  . CAD (coronary artery disease) 03/30/2018  . Elevated troponin 02/03/2018  . Positive urine drug screen 02/03/2018  . Tobacco abuse 02/03/2018  . Hyperlipidemia 02/03/2018  . Acute respiratory failure with hypoxia ([place]) 02/02/2018  . Acute congestive heart failure ([place])   . Acute bronchitis 12/28/2015  . Atypical chest pain 12/27/2015  . Essential hypertension   .  Neuropathy   . Viral illness     Current Outpatient Medications:  .  albuterol (PROVENTIL) (2.5 MG/3ML) 0.083% nebulizer solution, Take 2.5 mg by nebulization every 6 (six) hours as needed for wheezing or shortness of breath., Disp: , Rfl:  .  albuterol (VENTOLIN HFA) 108 (90 Base) MCG/ACT inhaler, Inhale 2 puffs into the lungs every 6 (six) hours as needed for wheezing or shortness of breath., Disp: , Rfl:  .  aspirin 81 MG chewable tablet, Chew 1 tablet (81 mg total) by mouth daily., Disp: 90 tablet, Rfl: 3 .  atorvastatin (LIPITOR) 80 MG tablet, Take 1 tablet (80 mg total) by mouth daily., Disp: 90 tablet, Rfl: 3 .  clopidogrel (PLAVIX) 75 MG tablet, Take 1 tablet (75 mg total) by mouth daily with breakfast., Disp: 90 tablet, Rfl: 3 .  dapagliflozin propanediol (FARXIGA) 10 MG TABS tablet, Take 10 mg by mouth daily before breakfast., Disp: 30 tablet, Rfl: 6 .  enalapril (VASOTEC) 20 MG tablet, Take 20 mg by mouth 2 (two) times daily., Disp: , Rfl:  .  fluticasone (FLONASE) 50 MCG/ACT nasal spray, Place 1 spray into both nostrils daily., Disp: 18.2 mL, Rfl: 2 .  furosemide (LASIX) 40 MG tablet, Take 1 tablet (40 mg total) by mouth 2 (two) times daily., Disp: 180 tablet, Rfl: 3 .  isosorbide mononitrate (IMDUR) 30 MG 24 hr tablet, Take 1 tablet (30 mg total) by mouth daily., Disp: 30 tablet, Rfl: 0 .  Multiple Vitamin (MULTIVITAMIN WITH MINERALS) TABS tablet, Take 1 tablet  by mouth daily., Disp: , Rfl:  .  potassium chloride SA (KLOR-CON) 20 MEQ tablet, TAKE 1 TABLET(20 MEQ) BY MOUTH DAILY, Disp: 90 tablet, Rfl: 3 .  pregabalin (LYRICA) 100 MG capsule, Take 1 tablet three times daily, Disp: 270 capsule, Rfl: 3 .  spironolactone (ALDACTONE) 25 MG tablet, Take 0.5 tablets (12.5 mg total) by mouth daily., Disp: 45 tablet, Rfl: 3 .  Tafamidis (VYNDAMAX) 61 MG CAPS, Take 61 mg by mouth daily., Disp: , Rfl:  .  nicotine polacrilex (SM NICOTINE POLACRILEX) 4 MG lozenge, Take 1 lozenge (4 mg total) by  mouth as needed for smoking cessation. (Patient not taking: Reported on 06/12/2019), Disp: 100 tablet, Rfl: 2 .  pantoprazole (PROTONIX) 40 MG tablet, Take 1 tablet (40 mg total) by mouth daily. (Patient not taking: Reported on 06/19/2019), Disp: 90 tablet, Rfl: 0 .  predniSONE (DELTASONE) 50 MG tablet, Take one tablet by mouth once daily for 5 days. (Patient not taking: Reported on 06/12/2019), Disp: 5 tablet, Rfl: 0 .  valACYclovir (VALTREX) 500 MG tablet, Take 1 tablet (500 mg total) by mouth daily. (Patient not taking: Reported on 06/19/2019), Disp: 90 tablet, Rfl: 3 Allergies  Allergen Reactions  . Varenicline Other (See Comments)    Patient reports laryngospasm stopped in the ER     Social History   Socioeconomic History  . Marital status: Divorced    Spouse name: Not on file  . Number of children: 2  . Years of education: 10  . Highest education level: Not on file  Occupational History  . Occupation: not employed  Tobacco Use  . Smoking status: Current Every Day Smoker    Packs/day: 0.50    Years: 54.00    Pack years: 27.00    Types: Cigarettes, Cigars  . Smokeless tobacco: Never Used  Substance and Sexual Activity  . Alcohol use: Yes    Alcohol/week: 1.0 standard drinks    Types: 1 Cans of beer per week  . Drug use: Yes    Comment: last use may last year  . Sexual activity: Yes  Other Topics Concern  . Not on file  Social History Narrative   Right handed   Two story home   No caffeine   Social Determinants of Health   Financial Resource Strain: Low Risk   . Difficulty of Paying Living Expenses: Not very hard  Food Insecurity: No Food Insecurity  . Worried About Charity fundraiser in the Last Year: Never true  . Ran Out of Food in the Last Year: Never true  Transportation Needs: No Transportation Needs  . Lack of Transportation (Medical): No  . Lack of Transportation (Non-Medical): No  Physical Activity: Insufficiently Active  . Days of Exercise per Week: 2  days  . Minutes of Exercise per Session: 20 min  Stress: No Stress Concern Present  . Feeling of Stress : Not at all  Social Connections: Somewhat Isolated  . Frequency of Communication with Friends and Family: More than three times a week  . Frequency of Social Gatherings with Friends and Family: Twice a week  . Attends Religious Services: 1 to 4 times per year  . Active Member of Clubs or Organizations: No  . Attends Archivist Meetings: Never  . Marital Status: Divorced  Human resources officer Violence: Not At Risk  . Fear of Current or Ex-Partner: No  . Emotionally Abused: No  . Physically Abused: No  . Sexually Abused: No    Physical Exam Vitals  reviewed.  HENT:     Head: Normocephalic.     Nose: Nose normal.     Mouth/Throat:     Mouth: Mucous membranes are moist.  Eyes:     Pupils: Pupils are equal, round, and reactive to light.  Cardiovascular:     Rate and Rhythm: Normal rate and regular rhythm.     Pulses: Normal pulses.     Heart sounds: Normal heart sounds.  Pulmonary:     Effort: Pulmonary effort is normal.     Breath sounds: Normal breath sounds.  Abdominal:     General: Abdomen is flat.     Palpations: Abdomen is soft.  Musculoskeletal:        General: Normal range of motion.     Cervical back: Normal range of motion.     Right lower leg: No edema.     Left lower leg: No edema.  Skin:    General: Skin is warm and dry.     Capillary Refill: Capillary refill takes less than 2 seconds.  Neurological:     Mental Status: He is alert. Mental status is at baseline.  Psychiatric:        Mood and Affect: Mood normal.     Arrived for home visit for Mr. Milan who was awake, alert and oriented denying any pain, shortness of breath, dizziness, trouble sleeping or walking. Azael made aware of upcoming sleep study, he understood and stated he has transportation set in place for same. Medications reviewed and confirmed. Pill box filled. Vitals obtained and as  recorded. Ramadan's weight maintained same for this week. No swelling or edema noted. Hinckley stated therapy has been going well. Home visit complete, will see in one week.  CBG- 171 Weight- 315lbs   Refills: Potassium (ready) Pregabalin (ready) Plavix (needs to be sent by [facility]) Pantoprazole (too early, can fill 5/12) Valtrex (RX not at [facility], PCP made aware)    Future Appointments  Date Time Provider [place]  06/20/2019 11:15 AM [facility] PHASE II EXC [facility] None  06/21/2019  8:00 PM [name], MD [facility]  06/22/2019 11:15 AM [facility] PHASE II EXC [facility] None  06/25/2019 11:15 AM [facility] PHASE II EXC [facility] None  06/27/2019 11:15 AM [facility] PHASE II EXC [facility] None  06/29/2019 11:15 AM [facility] PHASE II EXC [facility] None  07/02/2019 11:15 AM [facility] PHASE II EXC [facility] None  07/04/2019 11:15 AM [facility] PHASE II EXC [facility] None  07/06/2019 11:15 AM [facility] PHASE II EXC [facility] None  07/09/2019 11:15 AM [facility] PHASE II EXC [facility] None  07/11/2019 11:15 AM [facility] PHASE II EXC [facility] None  07/13/2019 11:15 AM [facility] PHASE II EXC [facility] None  07/16/2019 11:15 AM [facility] PHASE II EXC [facility] None  07/18/2019 11:15 AM [facility] PHASE II EXC [facility] None  07/20/2019 11:15 AM [facility] PHASE II EXC [facility] None  08/03/2019  9:40 AM [name], [name], MD [facility] None  08/21/2019  3:15 PM Gardiner Barefoot, DPM [facility]     ACTION: Home visit completed Next visit planned for one week

## 2019-06-20 ENCOUNTER — Telehealth (HOSPITAL_COMMUNITY): Payer: Self-pay | Admitting: Family Medicine

## 2019-06-20 ENCOUNTER — Telehealth (HOSPITAL_COMMUNITY): Payer: Self-pay | Admitting: Licensed Clinical Social Worker

## 2019-06-20 ENCOUNTER — Encounter (HOSPITAL_COMMUNITY): Payer: Medicaid Other

## 2019-06-20 ENCOUNTER — Other Ambulatory Visit (HOSPITAL_COMMUNITY): Payer: Self-pay | Admitting: *Deleted

## 2019-06-20 ENCOUNTER — Ambulatory Visit (HOSPITAL_COMMUNITY): Payer: Medicaid Other

## 2019-06-20 MED ORDER — CLOPIDOGREL BISULFATE 75 MG PO TABS: 75.0000 mg | ORAL_TABLET | Freq: Every day | ORAL | 3 refills | Status: DC

## 2019-06-20 NOTE — Telephone Encounter (Signed)
CSW called pt to check in and ensure that he had transportation for sleep study appt tomorrow.  Pt confirms he was able to find transportation to and from the sleep study and does not need further assistance at this time.  CSW encouraged pt to reach out if anything changes- will continue to follow and assist as needed  Russell Collier, [facility] Desk#: [phone] Cell#: [phone]

## 2019-06-21 ENCOUNTER — Ambulatory Visit (HOSPITAL_BASED_OUTPATIENT_CLINIC_OR_DEPARTMENT_OTHER): Payer: Medicaid Other | Attending: Adult Health | Admitting: Cardiology

## 2019-06-21 ENCOUNTER — Other Ambulatory Visit: Payer: Self-pay

## 2019-06-21 DIAGNOSIS — Z9989 Dependence on other enabling machines and devices: Secondary | ICD-10-CM | POA: Diagnosis not present

## 2019-06-21 DIAGNOSIS — G4733 Obstructive sleep apnea (adult) (pediatric): Secondary | ICD-10-CM | POA: Diagnosis not present

## 2019-06-22 ENCOUNTER — Ambulatory Visit (HOSPITAL_COMMUNITY): Payer: Medicaid Other

## 2019-06-22 ENCOUNTER — Telehealth: Payer: Self-pay | Admitting: Pharmacist

## 2019-06-22 ENCOUNTER — Encounter (HOSPITAL_COMMUNITY)
Admission: RE | Admit: 2019-06-22 | Discharge: 2019-06-22 | Disposition: A | Discharge status: Home / Self Care | Payer: Medicaid Other | Source: Ambulatory Visit | Attending: Internal Medicine | Admitting: Internal Medicine

## 2019-06-22 DIAGNOSIS — Z955 Presence of coronary angioplasty implant and graft: Secondary | ICD-10-CM | POA: Diagnosis not present

## 2019-06-22 NOTE — Telephone Encounter (Signed)
Contacted patient RE tobacco cessation.   Reports smoking about the same (1/2 ppd).  However, he also reports re-starting nicotine patch on his arm yesterday and notes no irritation at this time.  He continues to be interested in quitting however has made little progress with intake reduction.   Encouraged to continue cutting back/ working toward a quit date.   Plan Telephone follow-up in 7-10 days.

## 2019-06-25 ENCOUNTER — Other Ambulatory Visit: Payer: Self-pay

## 2019-06-25 ENCOUNTER — Ambulatory Visit (HOSPITAL_COMMUNITY): Payer: Medicaid Other

## 2019-06-25 ENCOUNTER — Encounter (HOSPITAL_COMMUNITY)
Admission: RE | Admit: 2019-06-25 | Discharge: 2019-06-25 | Disposition: A | Discharge status: Home / Self Care | Payer: Medicaid Other | Source: Ambulatory Visit | Attending: Internal Medicine | Admitting: Internal Medicine

## 2019-06-25 DIAGNOSIS — I25110 Atherosclerotic heart disease of native coronary artery with unstable angina pectoris: Secondary | ICD-10-CM | POA: Diagnosis not present

## 2019-06-25 DIAGNOSIS — Z955 Presence of coronary angioplasty implant and graft: Secondary | ICD-10-CM | POA: Diagnosis not present

## 2019-06-25 LAB — GLUCOSE, CAPILLARY: Glucose-Capillary: 109 mg/dL — ABNORMAL HIGH (ref 70–99)

## 2019-06-26 ENCOUNTER — Telehealth: Payer: Self-pay | Admitting: Adult Health

## 2019-06-26 ENCOUNTER — Other Ambulatory Visit (HOSPITAL_COMMUNITY): Payer: Self-pay

## 2019-06-26 NOTE — Progress Notes (Signed)
Paramedicine Encounter    Patient ID: Russell Collier, male    DOB: 15-Nov-1960, 59 y.o.   MRN: [id number]   Patient Care Team: [name], MD as PCP - General (Family Medicine) [name], MD as PCP - Cardiology (Cardiology) Michael Boston, MD as Consulting Physician (General Surgery) Wilford Corner, MD as Consulting Physician (Gastroenterology) Jorge Ny, LCSW as Social Worker (Licensed Clinical Social Worker) Alda Berthold, DO as Consulting Physician (Neurology)  Patient Active Problem List   Diagnosis Date Noted  . Breast tenderness in male 05/31/2019  . Chronic pain 05/31/2019  . AKI (acute kidney injury) ([place]) 05/31/2019  . Pain due to onychomycosis of toenails of both feet 05/16/2019  . Chronic knee pain 04/16/2019  . Diabetes ([place]) 04/16/2019  . Esophageal reflux 02/06/2019  . Heartburn 02/06/2019  . Chronic skin ulcer of lower leg ([place]) 12/21/2018  . S/P drug eluting coronary stent placement   . Coronary artery disease involving native coronary artery of native heart with unstable angina pectoris ([place]) 12/20/2018  . Unstable angina ([place])   . 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 Acute respiratory distress 10/16/2018  . Acute on chronic diastolic heart failure ([place])   . Shortness of breath   . Precordial chest pain   . Idiopathic peripheral neuropathy   . Palpitations   . Chronic diastolic heart failure ([place])   . Abnormal ankle brachial index (ABI)   . Chest pain, rule out acute myocardial infarction 09/26/2018  . Dyspepsia   . Morbid obesity ([place])   . OSA on CPAP   . Chest pain 05/30/2018  . CAD (coronary artery disease) 03/30/2018  . Elevated troponin 02/03/2018  . Positive urine drug screen 02/03/2018  . Tobacco abuse 02/03/2018  . Hyperlipidemia 02/03/2018  . Acute respiratory failure with hypoxia ([place]) 02/02/2018  . Acute congestive heart failure ([place])   . Acute bronchitis 12/28/2015  . Atypical chest pain 12/27/2015  . Essential hypertension   .  Neuropathy   . Viral illness     Current Outpatient Medications:  .  albuterol (PROVENTIL) (2.5 MG/3ML) 0.083% nebulizer solution, Take 2.5 mg by nebulization every 6 (six) hours as needed for wheezing or shortness of breath., Disp: , Rfl:  .  albuterol (VENTOLIN HFA) 108 (90 Base) MCG/ACT inhaler, Inhale 2 puffs into the lungs every 6 (six) hours as needed for wheezing or shortness of breath., Disp: , Rfl:  .  aspirin 81 MG chewable tablet, Chew 1 tablet (81 mg total) by mouth daily., Disp: 90 tablet, Rfl: 3 .  atorvastatin (LIPITOR) 80 MG tablet, Take 1 tablet (80 mg total) by mouth daily., Disp: 90 tablet, Rfl: 3 .  clopidogrel (PLAVIX) 75 MG tablet, Take 1 tablet (75 mg total) by mouth daily with breakfast., Disp: 90 tablet, Rfl: 3 .  dapagliflozin propanediol (FARXIGA) 10 MG TABS tablet, Take 10 mg by mouth daily before breakfast., Disp: 30 tablet, Rfl: 6 .  enalapril (VASOTEC) 20 MG tablet, Take 20 mg by mouth 2 (two) times daily., Disp: , Rfl:  .  fluticasone (FLONASE) 50 MCG/ACT nasal spray, Place 1 spray into both nostrils daily., Disp: 18.2 mL, Rfl: 2 .  furosemide (LASIX) 40 MG tablet, Take 1 tablet (40 mg total) by mouth 2 (two) times daily., Disp: 180 tablet, Rfl: 3 .  isosorbide mononitrate (IMDUR) 30 MG 24 hr tablet, Take 1 tablet (30 mg total) by mouth daily., Disp: 30 tablet, Rfl: 0 .  Multiple Vitamin (MULTIVITAMIN WITH MINERALS) TABS tablet, Take 1 tablet  by mouth daily., Disp: , Rfl:  .  potassium chloride SA (KLOR-CON) 20 MEQ tablet, TAKE 1 TABLET(20 MEQ) BY MOUTH DAILY, Disp: 90 tablet, Rfl: 3 .  pregabalin (LYRICA) 100 MG capsule, Take 1 tablet three times daily, Disp: 270 capsule, Rfl: 3 .  spironolactone (ALDACTONE) 25 MG tablet, Take 0.5 tablets (12.5 mg total) by mouth daily., Disp: 45 tablet, Rfl: 3 .  Tafamidis (VYNDAMAX) 61 MG CAPS, Take 61 mg by mouth daily., Disp: , Rfl:  .  nicotine polacrilex (SM NICOTINE POLACRILEX) 4 MG lozenge, Take 1 lozenge (4 mg total) by  mouth as needed for smoking cessation. (Patient not taking: Reported on 06/12/2019), Disp: 100 tablet, Rfl: 2 .  pantoprazole (PROTONIX) 40 MG tablet, Take 1 tablet (40 mg total) by mouth daily. (Patient not taking: Reported on 06/19/2019), Disp: 90 tablet, Rfl: 0 .  predniSONE (DELTASONE) 50 MG tablet, Take one tablet by mouth once daily for 5 days. (Patient not taking: Reported on 06/12/2019), Disp: 5 tablet, Rfl: 0 .  valACYclovir (VALTREX) 500 MG tablet, Take 1 tablet (500 mg total) by mouth daily. (Patient not taking: Reported on 06/19/2019), Disp: 90 tablet, Rfl: 3 Allergies  Allergen Reactions  . Varenicline Other (See Comments)    Patient reports laryngospasm stopped in the ER     Social History   Socioeconomic History  . Marital status: Divorced    Spouse name: Not on file  . Number of children: 2  . Years of education: 10  . Highest education level: Not on file  Occupational History  . Occupation: not employed  Tobacco Use  . Smoking status: Current Every Day Smoker    Packs/day: 0.50    Years: 54.00    Pack years: 27.00    Types: Cigarettes, Cigars  . Smokeless tobacco: Never Used  Substance and Sexual Activity  . Alcohol use: Yes    Alcohol/week: 1.0 standard drinks    Types: 1 Cans of beer per week  . Drug use: Yes    Comment: last use may last year  . Sexual activity: Yes  Other Topics Concern  . Not on file  Social History Narrative   Right handed   Two story home   No caffeine   Social Determinants of Health   Financial Resource Strain: Low Risk   . Difficulty of Paying Living Expenses: Not very hard  Food Insecurity: No Food Insecurity  . Worried About Charity fundraiser in the Last Year: Never true  . Ran Out of Food in the Last Year: Never true  Transportation Needs: No Transportation Needs  . Lack of Transportation (Medical): No  . Lack of Transportation (Non-Medical): No  Physical Activity: Insufficiently Active  . Days of Exercise per Week: 2  days  . Minutes of Exercise per Session: 20 min  Stress: No Stress Concern Present  . Feeling of Stress : Not at all  Social Connections: Somewhat Isolated  . Frequency of Communication with Friends and Family: More than three times a week  . Frequency of Social Gatherings with Friends and Family: Twice a week  . Attends Religious Services: 1 to 4 times per year  . Active Member of Clubs or Organizations: No  . Attends Archivist Meetings: Never  . Marital Status: Divorced  Human resources officer Violence: Not At Risk  . Fear of Current or Ex-Partner: No  . Emotionally Abused: No  . Physically Abused: No  . Sexually Abused: No    Physical Exam Vitals  reviewed.  Constitutional:      Appearance: He is normal weight.  HENT:     Head: Normocephalic.     Nose: Nose normal.     Mouth/Throat:     Mouth: Mucous membranes are moist.     Pharynx: Oropharynx is clear.  Eyes:     Pupils: Pupils are equal, round, and reactive to light.  Cardiovascular:     Rate and Rhythm: Normal rate and regular rhythm.     Pulses: Normal pulses.     Heart sounds: Normal heart sounds.  Pulmonary:     Effort: Pulmonary effort is normal.     Breath sounds: Normal breath sounds.  Abdominal:     Palpations: Abdomen is soft.  Musculoskeletal:        General: Normal range of motion.     Cervical back: Normal range of motion.     Right lower leg: No edema.     Left lower leg: No edema.  Skin:    General: Skin is warm and dry.     Capillary Refill: Capillary refill takes less than 2 seconds.  Neurological:     Mental Status: He is alert. Mental status is at baseline.  Psychiatric:        Mood and Affect: Mood normal.     Arrived for home visit for Rawson who was alert and oriented seated at the kitchen table outside of his home reporting no shortness of breath, chest pain, dizziness or trouble sleeping. Sederick stated the sleep study he had Friday went well. Randon reports having sore muscles  from Cardiac Rehab yesterday. Vitals were obtained and are as noted. Medications were reviewed and confirmed. Pill box filled accordingly. Minard requesting prednisone for right knee pain, I advised him that the RX was only good for a 5 day prescription and that he would have to call the orthopedic for same. He agreed. He also requested podiatry appointment for a painful toenail. I scheduled same for May 20th at 1045. Kaidence agreed to home visit in one week.   Refills: Pantoprazole Farxiga    CBG-97 Weight- 319lbs     Future Appointments  Date Time Provider [place]  06/27/2019 11:15 AM [facility] PHASE II EXC [facility] None  06/29/2019 11:15 AM [facility] PHASE II EXC [facility] None  07/02/2019 11:15 AM [facility] PHASE II EXC [facility] None  07/04/2019 11:15 AM [facility] PHASE II EXC [facility] None  07/06/2019 11:15 AM [facility] PHASE II EXC [facility] None  07/09/2019 11:15 AM [facility] PHASE II EXC [facility] None  07/11/2019 11:15 AM [facility] PHASE II EXC [facility] None  07/12/2019 10:45 AM Wallene Huh, DPM [facility]  07/13/2019 11:15 AM [facility] PHASE II EXC [facility] None  07/16/2019 11:15 AM [facility] PHASE II EXC [facility] None  07/18/2019 11:15 AM [facility] PHASE II EXC [facility] None  07/20/2019 11:15 AM [facility] PHASE II EXC [facility] None  08/03/2019  9:40 AM [name], [name], MD [facility] None  08/21/2019  3:15 PM Gardiner Barefoot, DPM [facility]     ACTION: Home visit completed Next visit planned for one week

## 2019-06-26 NOTE — Telephone Encounter (Signed)
I connected by phone with Russell Collier and/or patient's caregiver on 06/26/2019 at 7:56 PM to discuss the potential vaccination through our Homebound vaccination initiative.   Prevaccination Checklist for COVID-19 Vaccines  1.  Are you feeling sick today? no  2.  Have you ever received a dose of a COVID-19 vaccine?  no      If yes, which one? None   3.  Have you ever had an allergic reaction: (This would include a severe reaction [ e.g., anaphylaxis] that required treatment with epinephrine or EpiPen or that caused you to go to the hospital.  It would also include an allergic reaction that occurred within 4 hours that caused hives, swelling, or respiratory distress, including wheezing.) A.  A previous dose of COVID-19 vaccine. no  B.  A vaccine or injectable therapy that contains multiple components, one of which is a COVID-19 vaccine component, but it is not known which component elicited the immediate reaction. no  C.  Are you allergic to polyethylene glycol? no   4.  Have you ever had an allergic reaction to another vaccine (other than COVID-19 vaccine) or an injectable medication? (This would include a severe reaction [ e.g., anaphylaxis] that required treatment with epinephrine or EpiPen or that caused you to go to the hospital.  It would also include an allergic reaction that occurred within 4 hours that caused hives, swelling, or respiratory distress, including wheezing.)  no   5.  Have you ever had a severe allergic reaction (e.g., anaphylaxis) to something other than a component of the COVID-19 vaccine, or any vaccine or injectable medication?  This would include food, pet, venom, environmental, or oral medication allergies.  no   6.  Have you received any vaccine in the last 14 days? no   7.  Have you ever had a positive test for COVID-19 or has a doctor ever told you that you had COVID-19?  no   8.  Have you received passive antibody therapy (monoclonal antibodies or convalescent  serum) as a treatment for COVID-19? no   9.  Do you have a weakened immune system caused by something such as HIV infection or cancer or do you take immunosuppressive drugs or therapies?  no   10.  Do you have a bleeding disorder or are you taking a blood thinner? yes   11.  Are you pregnant or breast-feeding? no   12.  Do you have dermal fillers? no   __________________   This patient is a 59 y.o. male that meets the FDA criteria to receive homebound vaccination. Patient or parent/caregiver understands they have the option to accept or refuse homebound vaccination.  Patient passed the pre-screening checklist and would like to proceed with homebound vaccination.  Based on questionnaire above, I recommend the patient be observed for 30 minutes.  There are an estimated #0 of other household members/caregivers who are also interested in receiving the vaccine.      I will send the patient's information to our scheduling team who will reach out to schedule the patient and potential caregiver/family members for homebound vaccination.    Scot Dock 06/26/2019 7:56 PM

## 2019-06-27 ENCOUNTER — Ambulatory Visit (HOSPITAL_COMMUNITY): Payer: Medicaid Other

## 2019-06-27 ENCOUNTER — Other Ambulatory Visit: Payer: Self-pay

## 2019-06-27 ENCOUNTER — Encounter (HOSPITAL_COMMUNITY)
Admission: RE | Admit: 2019-06-27 | Discharge: 2019-06-27 | Disposition: A | Discharge status: Home / Self Care | Payer: Medicaid Other | Source: Ambulatory Visit | Attending: Internal Medicine | Admitting: Internal Medicine

## 2019-06-27 DIAGNOSIS — I25110 Atherosclerotic heart disease of native coronary artery with unstable angina pectoris: Secondary | ICD-10-CM | POA: Diagnosis not present

## 2019-06-27 DIAGNOSIS — Z955 Presence of coronary angioplasty implant and graft: Secondary | ICD-10-CM

## 2019-06-27 NOTE — Procedures (Signed)
Patient Name: Russell Collier, Russell Collier Date: 06/21/2019 Gender: Male D.O.B: [birth year] Age (years): 61 Referring Provider: Darrick Grinder NP Height (inches): 71 Interpreting Physician: Fransico Him MD, ABSM Weight (lbs): 320 RPSGT: Jorge Ny BMI: 45 MRN: [id number] Neck Size: 21.00 <br> <br> CLINICAL INFORMATION Sleep Study Type: Split Night CPAP  Indication for sleep study: Diabetes, Hypertension, Obesity, OSA, Snoring  Epworth Sleepiness Score: 5  SLEEP STUDY TECHNIQUE As per the AASM Manual for the Scoring of Sleep and Associated Events v2.3 (April 2016) with a hypopnea requiring 4% desaturations.  The channels recorded and monitored were frontal, central and occipital EEG, electrooculogram (EOG), submentalis EMG (chin), nasal and oral airflow, thoracic and abdominal wall motion, anterior tibialis EMG, snore microphone, electrocardiogram, and pulse oximetry. Continuous positive airway pressure (CPAP) was initiated when the patient met split night criteria and was titrated according to treat sleep-disordered breathing.  MEDICATIONS Medications self-administered by patient taken the night of the study : IBUFROFEN, [place] Diagnostic Total AHI (/hr):15.4  RDI (/hr):16.7  OA Index (/hr): -  CA Index (/hr): 0.0 REM AHI (/hr): 46.7  NREM AHI (/hr):7.5  Supine AHI (/hr):15.4  Non-supine AHI (/hr):0 Min O2 Sat (%):75.0  Mean O2 (%): 92.4  Time below 88% (min):8.9   Titration Optimal Pressure (cm):18  AHI at Optimal Pressure (/hr):0.0  Min O2 at Optimal Pressure (%):91.0 Supine % at Optimal (%):100  Sleep % at Optimal (%):94   SLEEP ARCHITECTURE The recording time for the entire night was 382.3 minutes.  During a baseline period of 145.0 minutes, the patient slept for 140.2 minutes in REM and nonREM, yielding a sleep efficiency of 96.7%%. Sleep onset after lights out was 3.2 minutes with a REM latency of 111.5 minutes. The  patient spent 2.5% of the night in stage N1 sleep, 77.4% in stage N2 sleep, 0.0% in stage N3 and 20.1% in REM.  During the titration period of 231.3 minutes, the patient slept for 226.2 minutes in REM and nonREM, yielding a sleep efficiency of 97.8%. Sleep onset after CPAP initiation was 0.0 minutes with a REM latency of 49.2 minutes. The patient spent 4.6% of the night in stage N1 sleep, 70.4% in stage N2 sleep, 0.0% in stage N3 and 25% in REM.  CARDIAC DATA The 2 lead EKG demonstrated sinus rhythm. The mean heart rate was 100.0 beats per minute. Other EKG findings include: PVCs.  LEG MOVEMENT DATA The total Periodic Limb Movements of Sleep (PLMS) were 0. The PLMS index was 0.0 .  IMPRESSIONS - Moderate obstructive sleep apnea occurred during the diagnostic portion of the study(AHI = 15.4/hour). An optimal PAP pressure was selected for this patient ( 18 cm of water) - No significant central sleep apnea occurred during the diagnostic portion of the study (CAI = 0.0/hour). - Severe oxygen desaturation was noted during the diagnostic portion of the study (Min O2 = 75.0%). - The patient snored with loud snoring volume during the diagnostic portion of the study. - EKG findings include PVCs. - Clinically significant periodic limb movements did not occur during sleep.  DIAGNOSIS - Obstructive Sleep Apnea (327.23 [G47.33 ICD-10]) - Nocturnal Hypoxemia  RECOMMENDATIONS - Trial of CPAP therapy on 18 cm H2O with a Large size Resmed Full Face Mask AirFit F20 mask and heated humidification. - Avoid alcohol, sedatives and other CNS depressants that may worsen sleep apnea and disrupt normal sleep architecture. - Sleep hygiene should be reviewed to assess factors that may improve sleep quality. - Weight  management and regular exercise should be initiated or continued. - Return to [facility] for re-evaluation after 8 weeks of therapy  [Electronically signed] 06/27/2019 09:42 PM  Fransico Him MD,  ABSM Diplomate, American Board of Sleep Medicine

## 2019-06-28 NOTE — Progress Notes (Signed)
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 Management/Obesity;Lipids;Diabetes;Hypertension;Tobacco Cessation      Review  [place] contines  [place] contines to smoke. Cessation encouraged. Roberts vital signs and CBG's have been stable. Md. has been walking the  track and taking rest breaks as needed doing well with exercise.      Expected Outcomes  --  Wynne will continue to participate in phase 2 cardiac rehab for exercise, nutrtion and lifestyle modifications         Core Components/Risk Factors/Patient Goals at Discharge (Final Review):  Goals and Risk Factor Review - 06/28/19 1504      Core Components/Risk Factors/Patient Goals Review   Personal Goals Review  Weight Management/Obesity;Lipids;Diabetes;Hypertension;Tobacco Cessation    Review  Trai contines to  smoke. Cessation encouraged. Roberts vital signs and CBG's have been stable. Zaydin has been walking the track and taking rest breaks as needed doing well with exercise.    Expected Outcomes  Eilan will continue to participate in phase 2 cardiac rehab for exercise, nutrtion and lifestyle modifications       ITP Comments: ITP Comments    Row Name 05/24/19 1519 05/31/19 1444 06/01/19 1230 06/28/19 1500     ITP Comments  Dr. Fransico Him, Medical Director  30 Day ITP Review. Patient attended orientation on 05/24/19 and is suppossed to start exercise this week  Mr. Mccleaf completed his first day of cardiac rehab today and tolerated fairly well. C/O significant leg pain/cramping bilat and chronic right knee pain. He had to stop exercise multiple times to do seated streteches secondary to the back and lower extremity pain. Denied all cardiac complaints. VSS.  30 Day ITP Review. Abaan has good partcipation and attendance in phase 2 cardiac rehab. Aum continues to have cramping of his lower extremities and takes frequent rest breaks.       Comments: See ITP Comments.Barnet Pall, RN,BSN 06/28/2019 3:09 PM

## 2019-06-29 ENCOUNTER — Other Ambulatory Visit: Payer: Self-pay

## 2019-06-29 ENCOUNTER — Ambulatory Visit (HOSPITAL_COMMUNITY): Payer: Medicaid Other

## 2019-06-29 ENCOUNTER — Telehealth: Payer: Self-pay | Admitting: *Deleted

## 2019-06-29 ENCOUNTER — Encounter (HOSPITAL_COMMUNITY)
Admission: RE | Admit: 2019-06-29 | Discharge: 2019-06-29 | Disposition: A | Discharge status: Home / Self Care | Payer: Medicaid Other | Source: Ambulatory Visit | Attending: Internal Medicine | Admitting: Internal Medicine

## 2019-06-29 DIAGNOSIS — Z955 Presence of coronary angioplasty implant and graft: Secondary | ICD-10-CM

## 2019-06-29 NOTE — Telephone Encounter (Signed)
-----   Message from [name], MD sent at 06/27/2019  9:45 PM EDT ----- Please let patient know that they have significant sleep apnea and had successful PAP titration and will be set up with PAP unit.  Please let DME know that order is in EPIC.  Please set patient up for OV in 8 weeks

## 2019-06-29 NOTE — Telephone Encounter (Signed)
Spoke with patient while he was at the hospital for PT. Recommended he get the COVID vaccine.

## 2019-06-29 NOTE — Telephone Encounter (Signed)
Informed patient of sleep study results and patient understanding was verbalized. Patient understands his sleep study showed they have significant sleep apnea and had successful PAP titration and will be set up with PAP unit.   Upon patient request DME selection is [facility]. Patient understands he will be contacted by [facility] to set up his cpap. Patient understands to call if [facility] does not contact him with new setup in a timely manner. Patient understands they will be called once confirmation has been received from [facility] that they have received their new machine to schedule 10 week follow up appointment.  [facility] notified of new cpap order  Please add to airview Patient was grateful for the call and thanked me.

## 2019-07-02 ENCOUNTER — Other Ambulatory Visit: Payer: Self-pay

## 2019-07-02 ENCOUNTER — Ambulatory Visit (HOSPITAL_COMMUNITY): Payer: Medicaid Other

## 2019-07-02 ENCOUNTER — Encounter (HOSPITAL_COMMUNITY)
Admission: RE | Admit: 2019-07-02 | Discharge: 2019-07-02 | Disposition: A | Discharge status: Home / Self Care | Payer: Medicaid Other | Source: Ambulatory Visit | Attending: Internal Medicine | Admitting: Internal Medicine

## 2019-07-02 DIAGNOSIS — Z955 Presence of coronary angioplasty implant and graft: Secondary | ICD-10-CM | POA: Diagnosis not present

## 2019-07-02 DIAGNOSIS — I25110 Atherosclerotic heart disease of native coronary artery with unstable angina pectoris: Secondary | ICD-10-CM | POA: Diagnosis not present

## 2019-07-03 ENCOUNTER — Other Ambulatory Visit (HOSPITAL_COMMUNITY): Payer: Self-pay

## 2019-07-03 NOTE — Progress Notes (Signed)
Paramedicine Encounter    Patient ID: Russell Collier, male    DOB: May 17, 1960, 59 y.o.   MRN: [id number]   Patient Care Team: [name], MD as PCP - General (Family Medicine) [name], MD as PCP - Cardiology (Cardiology) Michael Boston, MD as Consulting Physician (General Surgery) Wilford Corner, MD as Consulting Physician (Gastroenterology) Jorge Ny, LCSW as Social Worker (Licensed Clinical Social Worker) Alda Berthold, DO as Consulting Physician (Neurology)  Patient Active Problem List   Diagnosis Date Noted  . Breast tenderness in male 05/31/2019  . Chronic pain 05/31/2019  . AKI (acute kidney injury) ([place]) 05/31/2019  . Pain due to onychomycosis of toenails of both feet 05/16/2019  . Chronic knee pain 04/16/2019  . Diabetes ([place]) 04/16/2019  . Esophageal reflux 02/06/2019  . Heartburn 02/06/2019  . Chronic skin ulcer of lower leg ([place]) 12/21/2018  . S/P drug eluting coronary stent placement   . Coronary artery disease involving native coronary artery of native heart with unstable angina pectoris ([place]) 12/20/2018  . Unstable angina ([place])   . 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 Acute respiratory distress 10/16/2018  . Acute on chronic diastolic heart failure ([place])   . Shortness of breath   . Precordial chest pain   . Idiopathic peripheral neuropathy   . Palpitations   . Chronic diastolic heart failure ([place])   . Abnormal ankle brachial index (ABI)   . Chest pain, rule out acute myocardial infarction 09/26/2018  . Dyspepsia   . Morbid obesity ([place])   . OSA on CPAP   . Chest pain 05/30/2018  . CAD (coronary artery disease) 03/30/2018  . Elevated troponin 02/03/2018  . Positive urine drug screen 02/03/2018  . Tobacco abuse 02/03/2018  . Hyperlipidemia 02/03/2018  . Acute respiratory failure with hypoxia ([place]) 02/02/2018  . Acute congestive heart failure ([place])   . Acute bronchitis 12/28/2015  . Atypical chest pain 12/27/2015  . Essential hypertension   .  Neuropathy   . Viral illness     Current Outpatient Medications:  .  albuterol (PROVENTIL) (2.5 MG/3ML) 0.083% nebulizer solution, Take 2.5 mg by nebulization every 6 (six) hours as needed for wheezing or shortness of breath., Disp: , Rfl:  .  albuterol (VENTOLIN HFA) 108 (90 Base) MCG/ACT inhaler, Inhale 2 puffs into the lungs every 6 (six) hours as needed for wheezing or shortness of breath., Disp: , Rfl:  .  aspirin 81 MG chewable tablet, Chew 1 tablet (81 mg total) by mouth daily., Disp: 90 tablet, Rfl: 3 .  atorvastatin (LIPITOR) 80 MG tablet, Take 1 tablet (80 mg total) by mouth daily., Disp: 90 tablet, Rfl: 3 .  clopidogrel (PLAVIX) 75 MG tablet, Take 1 tablet (75 mg total) by mouth daily with breakfast., Disp: 90 tablet, Rfl: 3 .  dapagliflozin propanediol (FARXIGA) 10 MG TABS tablet, Take 10 mg by mouth daily before breakfast., Disp: 30 tablet, Rfl: 6 .  enalapril (VASOTEC) 20 MG tablet, Take 20 mg by mouth 2 (two) times daily., Disp: , Rfl:  .  fluticasone (FLONASE) 50 MCG/ACT nasal spray, Place 1 spray into both nostrils daily., Disp: 18.2 mL, Rfl: 2 .  furosemide (LASIX) 40 MG tablet, Take 1 tablet (40 mg total) by mouth 2 (two) times daily., Disp: 180 tablet, Rfl: 3 .  isosorbide mononitrate (IMDUR) 30 MG 24 hr tablet, Take 1 tablet (30 mg total) by mouth daily., Disp: 30 tablet, Rfl: 0 .  Multiple Vitamin (MULTIVITAMIN WITH MINERALS) TABS tablet, Take 1 tablet  by mouth daily., Disp: , Rfl:  .  pantoprazole (PROTONIX) 40 MG tablet, Take 1 tablet (40 mg total) by mouth daily., Disp: 90 tablet, Rfl: 0 .  potassium chloride SA (KLOR-CON) 20 MEQ tablet, TAKE 1 TABLET(20 MEQ) BY MOUTH DAILY, Disp: 90 tablet, Rfl: 3 .  pregabalin (LYRICA) 100 MG capsule, Take 1 tablet three times daily, Disp: 270 capsule, Rfl: 3 .  spironolactone (ALDACTONE) 25 MG tablet, Take 0.5 tablets (12.5 mg total) by mouth daily., Disp: 45 tablet, Rfl: 3 .  Tafamidis (VYNDAMAX) 61 MG CAPS, Take 61 mg by mouth  daily., Disp: , Rfl:  .  nicotine polacrilex (SM NICOTINE POLACRILEX) 4 MG lozenge, Take 1 lozenge (4 mg total) by mouth as needed for smoking cessation. (Patient not taking: Reported on 06/12/2019), Disp: 100 tablet, Rfl: 2 .  predniSONE (DELTASONE) 50 MG tablet, Take one tablet by mouth once daily for 5 days. (Patient not taking: Reported on 06/12/2019), Disp: 5 tablet, Rfl: 0 .  valACYclovir (VALTREX) 500 MG tablet, Take 1 tablet (500 mg total) by mouth daily. (Patient not taking: Reported on 06/19/2019), Disp: 90 tablet, Rfl: 3 Allergies  Allergen Reactions  . Varenicline Other (See Comments)    Patient reports laryngospasm stopped in the ER      Social History   Socioeconomic History  . Marital status: Divorced    Spouse name: Not on file  . Number of children: 2  . Years of education: 10  . Highest education level: Not on file  Occupational History  . Occupation: not employed  Tobacco Use  . Smoking status: Current Every Day Smoker    Packs/day: 0.50    Years: 54.00    Pack years: 27.00    Types: Cigarettes, Cigars  . Smokeless tobacco: Never Used  Substance and Sexual Activity  . Alcohol use: Yes    Alcohol/week: 1.0 standard drinks    Types: 1 Cans of beer per week  . Drug use: Yes    Comment: last use may last year  . Sexual activity: Yes  Other Topics Concern  . Not on file  Social History Narrative   Right handed   Two story home   No caffeine   Social Determinants of Health   Financial Resource Strain: Low Risk   . Difficulty of Paying Living Expenses: Not very hard  Food Insecurity: No Food Insecurity  . Worried About Charity fundraiser in the Last Year: Never true  . Ran Out of Food in the Last Year: Never true  Transportation Needs: No Transportation Needs  . Lack of Transportation (Medical): No  . Lack of Transportation (Non-Medical): No  Physical Activity: Insufficiently Active  . Days of Exercise per Week: 2 days  . Minutes of Exercise per  Session: 20 min  Stress: No Stress Concern Present  . Feeling of Stress : Not at all  Social Connections: Somewhat Isolated  . Frequency of Communication with Friends and Family: More than three times a week  . Frequency of Social Gatherings with Friends and Family: Twice a week  . Attends Religious Services: 1 to 4 times per year  . Active Member of Clubs or Organizations: No  . Attends Archivist Meetings: Never  . Marital Status: Divorced  Human resources officer Violence: Not At Risk  . Fear of Current or Ex-Partner: No  . Emotionally Abused: No  . Physically Abused: No  . Sexually Abused: No    Physical Exam      Future  Appointments  Date Time Provider [place]  07/04/2019 11:15 AM [facility] PHASE II EXC [facility] None  07/06/2019 11:15 AM [facility] PHASE II EXC [facility] None  07/09/2019 11:15 AM [facility] PHASE II EXC [facility] None  07/11/2019 11:15 AM [facility] PHASE II EXC [facility] None  07/12/2019 10:45 AM Wallene Huh, DPM [facility]  07/13/2019 11:15 AM [facility] PHASE II EXC [facility] None  07/16/2019 11:15 AM [facility] PHASE II EXC [facility] None  07/18/2019 11:15 AM [facility] PHASE II EXC [facility] None  07/20/2019 11:15 AM [facility] PHASE II EXC [facility] None  08/03/2019  9:40 AM [name], [name], MD [facility] None  08/21/2019  3:15 PM Gardiner Barefoot, DPM [facility]    BP 130/72   Pulse 86   Temp 97.7 F (36.5 C)   Resp 18   Wt (!) 316 lb (143.3 kg)   SpO2 98%   BMI 45.34 kg/m  CBG PTA-102 Weight yesterday-@ rehab-316 Last visit weight-319  Pt reports he has been having issues with his heartburn the last few days and vomiting off and on, spitting up phlegm and he feels the burn.  Pt reports he was taking pantoprazole BID but then the doc changed it to once a day, he isnt sure why.  So we may need to ask his PCP about placing him back on it BID. He also wants to do a colonoscopy and endoscopy.  He has been taking his pantoprazole with  his other meds and not separately.  Also he takes his meds on empty stomach. So advised him to try to eat something with his meds.   No missed doses of his meds.  meds verified and pill box refilled.   --needs refill on enalapril, test strips, and lancets.   He weighed during our visit.  He said he has a hard time seeing the numbers on the scale but he seemed to read them fine today.   Marylouise Stacks, [facility] Paramedic  07/03/19

## 2019-07-04 ENCOUNTER — Ambulatory Visit (HOSPITAL_COMMUNITY): Payer: Medicaid Other

## 2019-07-04 ENCOUNTER — Other Ambulatory Visit: Payer: Self-pay

## 2019-07-04 ENCOUNTER — Encounter (HOSPITAL_COMMUNITY)
Admission: RE | Admit: 2019-07-04 | Discharge: 2019-07-04 | Disposition: A | Discharge status: Home / Self Care | Payer: Medicaid Other | Source: Ambulatory Visit | Attending: Internal Medicine | Admitting: Internal Medicine

## 2019-07-04 DIAGNOSIS — I25110 Atherosclerotic heart disease of native coronary artery with unstable angina pectoris: Secondary | ICD-10-CM | POA: Diagnosis not present

## 2019-07-04 DIAGNOSIS — Z955 Presence of coronary angioplasty implant and graft: Secondary | ICD-10-CM | POA: Diagnosis not present

## 2019-07-06 ENCOUNTER — Encounter (HOSPITAL_COMMUNITY)
Admission: RE | Admit: 2019-07-06 | Discharge: 2019-07-06 | Disposition: A | Discharge status: Home / Self Care | Payer: Medicaid Other | Source: Ambulatory Visit | Attending: Internal Medicine | Admitting: Internal Medicine

## 2019-07-06 ENCOUNTER — Other Ambulatory Visit: Payer: Self-pay

## 2019-07-06 ENCOUNTER — Ambulatory Visit (HOSPITAL_COMMUNITY): Payer: Medicaid Other

## 2019-07-06 ENCOUNTER — Telehealth: Payer: Self-pay | Admitting: Pharmacist

## 2019-07-06 DIAGNOSIS — Z955 Presence of coronary angioplasty implant and graft: Secondary | ICD-10-CM

## 2019-07-06 DIAGNOSIS — I25110 Atherosclerotic heart disease of native coronary artery with unstable angina pectoris: Secondary | ICD-10-CM | POA: Diagnosis not present

## 2019-07-06 NOTE — Telephone Encounter (Signed)
Contacted patient RE tobacco cessation.    Patient reported increased back/hip pain that he described as "severe".  He reports increasing his cigarette intake to ~ 1 ppd.   Despite his 9/10 pain at times, he is willing to restart patches on his arms (not chest as this leads to sensitivity) and keep intake to 6 cigs per day.  This is his short-term goal until he can get his back pain under better control.   I shared that I would send a short note RE back pain to his PCP, but also shared that he may need to come to the office for evaluation.   I plan to follow-up in 2-3 weeks to reassess state of progress and plan.

## 2019-07-09 ENCOUNTER — Other Ambulatory Visit: Payer: Self-pay

## 2019-07-09 ENCOUNTER — Ambulatory Visit (HOSPITAL_COMMUNITY): Payer: Medicaid Other

## 2019-07-09 ENCOUNTER — Encounter (HOSPITAL_COMMUNITY)
Admission: RE | Admit: 2019-07-09 | Discharge: 2019-07-09 | Disposition: A | Discharge status: Home / Self Care | Payer: Medicaid Other | Source: Ambulatory Visit | Attending: Internal Medicine | Admitting: Internal Medicine

## 2019-07-09 DIAGNOSIS — Z955 Presence of coronary angioplasty implant and graft: Secondary | ICD-10-CM

## 2019-07-09 DIAGNOSIS — I25110 Atherosclerotic heart disease of native coronary artery with unstable angina pectoris: Secondary | ICD-10-CM | POA: Diagnosis not present

## 2019-07-09 LAB — GLUCOSE, CAPILLARY
Glucose-Capillary: 88 mg/dL (ref 70–99)
Glucose-Capillary: 107 mg/dL — ABNORMAL HIGH (ref 70–99)

## 2019-07-10 ENCOUNTER — Other Ambulatory Visit (HOSPITAL_COMMUNITY): Payer: Self-pay

## 2019-07-10 NOTE — Progress Notes (Signed)
Paramedicine Encounter    Patient ID: Russell Collier, male    DOB: May 25, 1960, 59 y.o.   MRN: [id number]   Patient Care Team: [name], MD as PCP - General (Family Medicine) [name], MD as PCP - Cardiology (Cardiology) Michael Boston, MD as Consulting Physician (General Surgery) Wilford Corner, MD as Consulting Physician (Gastroenterology) Jorge Ny, LCSW as Social Worker (Licensed Clinical Social Worker) Alda Berthold, DO as Consulting Physician (Neurology)  Patient Active Problem List   Diagnosis Date Noted  . Breast tenderness in male 05/31/2019  . Chronic pain 05/31/2019  . AKI (acute kidney injury) ([place]) 05/31/2019  . Pain due to onychomycosis of toenails of both feet 05/16/2019  . Chronic knee pain 04/16/2019  . Diabetes ([place]) 04/16/2019  . Esophageal reflux 02/06/2019  . Heartburn 02/06/2019  . Chronic skin ulcer of lower leg ([place]) 12/21/2018  . S/P drug eluting coronary stent placement   . Coronary artery disease involving native coronary artery of native heart with unstable angina pectoris ([place]) 12/20/2018  . Unstable angina ([place])   . 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 Acute respiratory distress 10/16/2018  . Acute on chronic diastolic heart failure ([place])   . Shortness of breath   . Precordial chest pain   . Idiopathic peripheral neuropathy   . Palpitations   . Chronic diastolic heart failure ([place])   . Abnormal ankle brachial index (ABI)   . Chest pain, rule out acute myocardial infarction 09/26/2018  . Dyspepsia   . Morbid obesity ([place])   . OSA on CPAP   . Chest pain 05/30/2018  . CAD (coronary artery disease) 03/30/2018  . Elevated troponin 02/03/2018  . Positive urine drug screen 02/03/2018  . Tobacco abuse 02/03/2018  . Hyperlipidemia 02/03/2018  . Acute respiratory failure with hypoxia ([place]) 02/02/2018  . Acute congestive heart failure ([place])   . Acute bronchitis 12/28/2015  . Atypical chest pain 12/27/2015  . Essential hypertension   .  Neuropathy   . Viral illness     Current Outpatient Medications:  .  albuterol (PROVENTIL) (2.5 MG/3ML) 0.083% nebulizer solution, Take 2.5 mg by nebulization every 6 (six) hours as needed for wheezing or shortness of breath., Disp: , Rfl:  .  albuterol (VENTOLIN HFA) 108 (90 Base) MCG/ACT inhaler, Inhale 2 puffs into the lungs every 6 (six) hours as needed for wheezing or shortness of breath., Disp: , Rfl:  .  aspirin 81 MG chewable tablet, Chew 1 tablet (81 mg total) by mouth daily., Disp: 90 tablet, Rfl: 3 .  atorvastatin (LIPITOR) 80 MG tablet, Take 1 tablet (80 mg total) by mouth daily., Disp: 90 tablet, Rfl: 3 .  clopidogrel (PLAVIX) 75 MG tablet, Take 1 tablet (75 mg total) by mouth daily with breakfast., Disp: 90 tablet, Rfl: 3 .  dapagliflozin propanediol (FARXIGA) 10 MG TABS tablet, Take 10 mg by mouth daily before breakfast., Disp: 30 tablet, Rfl: 6 .  enalapril (VASOTEC) 20 MG tablet, Take 20 mg by mouth 2 (two) times daily., Disp: , Rfl:  .  fluticasone (FLONASE) 50 MCG/ACT nasal spray, Place 1 spray into both nostrils daily., Disp: 18.2 mL, Rfl: 2 .  furosemide (LASIX) 40 MG tablet, Take 1 tablet (40 mg total) by mouth 2 (two) times daily., Disp: 180 tablet, Rfl: 3 .  isosorbide mononitrate (IMDUR) 30 MG 24 hr tablet, Take 1 tablet (30 mg total) by mouth daily., Disp: 30 tablet, Rfl: 0 .  Multiple Vitamin (MULTIVITAMIN WITH MINERALS) TABS tablet, Take 1 tablet  by mouth daily., Disp: , Rfl:  .  pantoprazole (PROTONIX) 40 MG tablet, Take 1 tablet (40 mg total) by mouth daily., Disp: 90 tablet, Rfl: 0 .  pregabalin (LYRICA) 100 MG capsule, Take 1 tablet three times daily, Disp: 270 capsule, Rfl: 3 .  spironolactone (ALDACTONE) 25 MG tablet, Take 0.5 tablets (12.5 mg total) by mouth daily., Disp: 45 tablet, Rfl: 3 .  nicotine polacrilex (SM NICOTINE POLACRILEX) 4 MG lozenge, Take 1 lozenge (4 mg total) by mouth as needed for smoking cessation. (Patient not taking: Reported on 06/12/2019),  Disp: 100 tablet, Rfl: 2 .  potassium chloride SA (KLOR-CON) 20 MEQ tablet, TAKE 1 TABLET(20 MEQ) BY MOUTH DAILY, Disp: 90 tablet, Rfl: 3 .  predniSONE (DELTASONE) 50 MG tablet, Take one tablet by mouth once daily for 5 days. (Patient not taking: Reported on 06/12/2019), Disp: 5 tablet, Rfl: 0 .  Tafamidis (VYNDAMAX) 61 MG CAPS, Take 61 mg by mouth daily., Disp: , Rfl:  .  valACYclovir (VALTREX) 500 MG tablet, Take 1 tablet (500 mg total) by mouth daily. (Patient not taking: Reported on 06/19/2019), Disp: 90 tablet, Rfl: 3 Allergies  Allergen Reactions  . Varenicline Other (See Comments)    Patient reports laryngospasm stopped in the ER     Social History   Socioeconomic History  . Marital status: Divorced    Spouse name: Not on file  . Number of children: 2  . Years of education: 10  . Highest education level: Not on file  Occupational History  . Occupation: not employed  Tobacco Use  . Smoking status: Current Every Day Smoker    Packs/day: 0.50    Years: 54.00    Pack years: 27.00    Types: Cigarettes, Cigars  . Smokeless tobacco: Never Used  Substance and Sexual Activity  . Alcohol use: Yes    Alcohol/week: 1.0 standard drinks    Types: 1 Cans of beer per week  . Drug use: Yes    Comment: last use may last year  . Sexual activity: Yes  Other Topics Concern  . Not on file  Social History Narrative   Right handed   Two story home   No caffeine   Social Determinants of Health   Financial Resource Strain: Low Risk   . Difficulty of Paying Living Expenses: Not very hard  Food Insecurity: No Food Insecurity  . Worried About Charity fundraiser in the Last Year: Never true  . Ran Out of Food in the Last Year: Never true  Transportation Needs: No Transportation Needs  . Lack of Transportation (Medical): No  . Lack of Transportation (Non-Medical): No  Physical Activity: Insufficiently Active  . Days of Exercise per Week: 2 days  . Minutes of Exercise per Session: 20 min   Stress: No Stress Concern Present  . Feeling of Stress : Not at all  Social Connections: Somewhat Isolated  . Frequency of Communication with Friends and Family: More than three times a week  . Frequency of Social Gatherings with Friends and Family: Twice a week  . Attends Religious Services: 1 to 4 times per year  . Active Member of Clubs or Organizations: No  . Attends Archivist Meetings: Never  . Marital Status: Divorced  Human resources officer Violence: Not At Risk  . Fear of Current or Ex-Partner: No  . Emotionally Abused: No  . Physically Abused: No  . Sexually Abused: No    Physical Exam Vitals reviewed.  HENT:  Head: Normocephalic.     Nose: Nose normal.     Mouth/Throat:     Mouth: Mucous membranes are moist.  Eyes:     Pupils: Pupils are equal, round, and reactive to light.  Cardiovascular:     Rate and Rhythm: Normal rate and regular rhythm.     Pulses: Normal pulses.     Heart sounds: Normal heart sounds.  Pulmonary:     Effort: Pulmonary effort is normal.     Breath sounds: Normal breath sounds.  Abdominal:     Palpations: Abdomen is soft.  Musculoskeletal:        General: Tenderness present. Normal range of motion.     Cervical back: Normal range of motion.     Right lower leg: Edema present.     Left lower leg: Edema present.     Comments: Left hip pain, right leg and back pain.   Skin:    General: Skin is warm and dry.     Capillary Refill: Capillary refill takes less than 2 seconds.  Neurological:     Mental Status: He is alert. Mental status is at baseline.  Psychiatric:        Mood and Affect: Mood normal.    Arrived for home visit for Mr. Ruter who was alert and oriented complaining of right leg, left hip and back pain. Vitals obtained and as noted. Mr. Hinchee reports he needs something for pain and he has been taking over 800mg  Ibuprofen daily to assist with his pain. I made an appointment with his ortho surgery Dr. Linton Rump for his knee  years ago. Mr. Stauffacher reports he has been eating some fast food but trying to reduce his salt and sugar intake. His sugar has been running low prior to cardiac rehab so they make him eat something to assist getting his sugar up prior to working out. No edema noted in legs. Lung sounds clear. Medications reviewed and as noted. Pill box filled accordingly. Enalapril short Monday and Tuesday. Refills noted. I plan to see Mr. Corbin in one week. Home visit complete.    Refills: Enalapril ASA Multivitamin Flonase Isosorbide Spironolactone Atorvastatin Plavix  Vyndamax    CBG- 106     Future Appointments  Date Time Provider [place]  07/11/2019 11:15 AM [facility] PHASE II EXC [facility] None  07/12/2019 10:45 AM Wallene Huh, DPM [facility]  07/13/2019 11:15 AM [facility] PHASE II EXC [facility] None  07/16/2019 11:15 AM [facility] PHASE II EXC [facility] None  07/18/2019 11:15 AM [facility] PHASE II EXC [facility] None  07/20/2019 11:15 AM [facility] PHASE II EXC [facility] None  08/03/2019  9:40 AM [name], [name], MD [facility] None  08/21/2019  3:15 PM Gardiner Barefoot, DPM [facility]     ACTION: Home visit completed Next visit planned for ONE WEEK

## 2019-07-11 ENCOUNTER — Encounter (HOSPITAL_COMMUNITY)
Admission: RE | Admit: 2019-07-11 | Discharge: 2019-07-11 | Disposition: A | Discharge status: Home / Self Care | Payer: Medicaid Other | Source: Ambulatory Visit | Attending: Internal Medicine | Admitting: Internal Medicine

## 2019-07-11 ENCOUNTER — Telehealth: Payer: Self-pay | Admitting: Family Medicine

## 2019-07-11 ENCOUNTER — Ambulatory Visit (HOSPITAL_COMMUNITY): Payer: Medicaid Other

## 2019-07-11 ENCOUNTER — Other Ambulatory Visit: Payer: Self-pay

## 2019-07-11 DIAGNOSIS — I25110 Atherosclerotic heart disease of native coronary artery with unstable angina pectoris: Secondary | ICD-10-CM | POA: Diagnosis not present

## 2019-07-11 DIAGNOSIS — Z955 Presence of coronary angioplasty implant and graft: Secondary | ICD-10-CM | POA: Diagnosis not present

## 2019-07-11 DIAGNOSIS — G8929 Other chronic pain: Secondary | ICD-10-CM

## 2019-07-11 LAB — GLUCOSE, CAPILLARY: Glucose-Capillary: 107 mg/dL — ABNORMAL HIGH (ref 70–99)

## 2019-07-11 NOTE — Telephone Encounter (Signed)
Received message from patient's caregiver who reported he is going to see an orthopedics at [facility].  He needs an ambulatory referral to this.  Ambulatory referral placed.

## 2019-07-11 NOTE — Telephone Encounter (Signed)
-----   Message from Jeris Penta, EMT sent at 07/10/2019 12:27 PM EDT ----- Regarding: REFERAL Hey Yosiah, Hairr has an appointment scheduled for June 1 at 1120 at [facility] in [facility] and they are requesting a referral from your office for approval with medicaid to be seen in their office. Please send referral to them at your earliest convenience. Thanks.     Jeris Penta

## 2019-07-12 ENCOUNTER — Ambulatory Visit (INDEPENDENT_AMBULATORY_CARE_PROVIDER_SITE_OTHER): Payer: Medicaid Other | Admitting: Podiatry

## 2019-07-12 ENCOUNTER — Encounter: Payer: Self-pay | Admitting: Podiatry

## 2019-07-12 ENCOUNTER — Telehealth (HOSPITAL_COMMUNITY): Payer: Self-pay | Admitting: *Deleted

## 2019-07-12 VITALS — Temp 97.3°F

## 2019-07-12 DIAGNOSIS — M79674 Pain in right toe(s): Secondary | ICD-10-CM

## 2019-07-12 DIAGNOSIS — B351 Tinea unguium: Secondary | ICD-10-CM | POA: Diagnosis not present

## 2019-07-12 DIAGNOSIS — M79675 Pain in left toe(s): Secondary | ICD-10-CM

## 2019-07-12 DIAGNOSIS — L600 Ingrowing nail: Secondary | ICD-10-CM | POA: Diagnosis not present

## 2019-07-12 MED ORDER — NEOMYCIN-POLYMYXIN-HC 3.5-10000-1 OT SOLN
OTIC | 0 refills | Status: DC
Start: 2019-07-12 — End: 2019-07-12

## 2019-07-12 NOTE — Telephone Encounter (Signed)
Left message to call cardiac rehab. Patient called to report he had a toenail removed.Barnet Pall, RN,BSN 07/12/2019 1:21 PM

## 2019-07-12 NOTE — Patient Instructions (Signed)

## 2019-07-12 NOTE — Telephone Encounter (Signed)
Phoned pt to follow up with his phone call earlier regarding a toenail removal. Pt states he was instructed to not wear tight shoes. Encouraged pt to take 07/13/2019 off from rehab to help with healing. Pt very adamant that he can still exercise and that his new balances will not be tight. Instructed pt to call cardiac rehab in the morning, if he changes his mind and decides that the shoes and exercise will not work.   Carma Lair MS, ACSM CEP 4:30 PM 07/12/2019

## 2019-07-13 ENCOUNTER — Ambulatory Visit (INDEPENDENT_AMBULATORY_CARE_PROVIDER_SITE_OTHER): Payer: Medicaid Other | Admitting: Family Medicine

## 2019-07-13 ENCOUNTER — Other Ambulatory Visit: Payer: Self-pay

## 2019-07-13 ENCOUNTER — Encounter (HOSPITAL_COMMUNITY): Payer: Medicaid Other

## 2019-07-13 ENCOUNTER — Encounter: Payer: Self-pay | Admitting: Family Medicine

## 2019-07-13 ENCOUNTER — Ambulatory Visit (HOSPITAL_COMMUNITY): Payer: Medicaid Other

## 2019-07-13 ENCOUNTER — Telehealth (HOSPITAL_COMMUNITY): Payer: Self-pay

## 2019-07-13 DIAGNOSIS — M5442 Lumbago with sciatica, left side: Secondary | ICD-10-CM | POA: Diagnosis not present

## 2019-07-13 DIAGNOSIS — G8929 Other chronic pain: Secondary | ICD-10-CM

## 2019-07-13 MED ORDER — BACLOFEN 10 MG PO TABS: 5.0000 mg | ORAL_TABLET | Freq: Three times a day (TID) | ORAL | 3 refills | Status: DC | PRN
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
## 2019-07-13 NOTE — Telephone Encounter (Signed)
Pt called and stated that he want be able to make cardiac rehab today because he had a toenail removed. Canceled cardiac rehab session.

## 2019-07-16 ENCOUNTER — Encounter (HOSPITAL_COMMUNITY): Payer: Medicaid Other

## 2019-07-16 ENCOUNTER — Ambulatory Visit (HOSPITAL_COMMUNITY): Payer: Medicaid Other

## 2019-07-16 MED FILL — VYNDAMAX 61 MG CAPS: 61 | 30 days supply | Qty: 30 | Fill #7

## 2019-07-16 NOTE — Progress Notes (Signed)
Subjective:   Patient ID: Russell Collier, male   DOB: 59 y.o.   MRN: [id number]   HPI Patient presents stating that the big toenail left has really gotten worse for him and he wants it removed permanently.  States that it is increasingly sore all nails are thickened and he had questions concerning nose but this 1 has become more bothersome   ROS      Objective:  Physical Exam  Neurovascular status is found to be intact with good digital perfusion well oriented x3.  I noted the left hallux nail to be very thickened and incurvated it is painful when pressed there is no redness no drainage noted and all nails have incurvation that he is concerned about     Assessment:  Chronic ingrown toenail deformity left hallux that is deformed and despite trimming to his remain tender along with mycotic nail infection 1 through 5 both feet     Plan:  H&P conditions reviewed.  I recommended that the left hallux nail be removed and I explained procedure risk to patient and he wants to have this done.  At this point I went ahead I infiltrated the left hallux 60 mg Xylocaine Marcaine mixture sterile prep applied and using sterile instrumentation I remove the hallux nail and applied phenol 5 applications to the base all done under sterile technique.  I then applied sterile dressing gave instructions to leave it on 24 hours but take it off earlier if any throbbing or pathology were to occur and patient is encouraged to call with any questions which may come up.  Reviewed his nail disease and I recommended he stay on the same course he has been on as far as that goes

## 2019-07-17 ENCOUNTER — Other Ambulatory Visit (HOSPITAL_COMMUNITY): Payer: Self-pay

## 2019-07-17 ENCOUNTER — Other Ambulatory Visit (HOSPITAL_COMMUNITY): Payer: Self-pay | Admitting: *Deleted

## 2019-07-17 MED ORDER — ATORVASTATIN CALCIUM 80 MG PO TABS: 80.0000 mg | ORAL_TABLET | Freq: Every day | ORAL | 3 refills | Status: DC

## 2019-07-17 MED ORDER — ISOSORBIDE MONONITRATE ER 30 MG PO TB24: 30.0000 mg | ORAL_TABLET | Freq: Every day | ORAL | 3 refills | Status: DC

## 2019-07-17 MED ORDER — SPIRONOLACTONE 25 MG PO TABS: 12.5000 mg | ORAL_TABLET | Freq: Every day | ORAL | 3 refills | Status: DC

## 2019-07-17 NOTE — Progress Notes (Signed)
Paramedicine Encounter    Patient ID: Russell Collier, male    DOB: 1961-01-05, 59 y.o.   MRN: [id number]   Patient Care Team: [name], MD as PCP - General (Family Medicine) [name], MD as PCP - Cardiology (Cardiology) Michael Boston, MD as Consulting Physician (General Surgery) Wilford Corner, MD as Consulting Physician (Gastroenterology) Jorge Ny, LCSW as Social Worker (Licensed Clinical Social Worker) Alda Berthold, DO as Consulting Physician (Neurology)  Patient Active Problem List   Diagnosis Date Noted  . Breast tenderness in male 05/31/2019  . Chronic pain 05/31/2019  . AKI (acute kidney injury) ([place]) 05/31/2019  . Pain due to onychomycosis of toenails of both feet 05/16/2019  . Chronic knee pain 04/16/2019  . Diabetes ([place]) 04/16/2019  . Esophageal reflux 02/06/2019  . Heartburn 02/06/2019  . Chronic skin ulcer of lower leg ([place]) 12/21/2018  . S/P drug eluting coronary stent placement   . Coronary artery disease involving native coronary artery of native heart with unstable angina pectoris ([place]) 12/20/2018  . Unstable angina ([place])   . 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 Acute respiratory distress 10/16/2018  . Acute on chronic diastolic heart failure ([place])   . Shortness of breath   . Precordial chest pain   . Idiopathic peripheral neuropathy   . Palpitations   . Chronic diastolic heart failure ([place])   . Abnormal ankle brachial index (ABI)   . Chest pain, rule out acute myocardial infarction 09/26/2018  . Dyspepsia   . Morbid obesity ([place])   . OSA on CPAP   . Chest pain 05/30/2018  . CAD (coronary artery disease) 03/30/2018  . Elevated troponin 02/03/2018  . Positive urine drug screen 02/03/2018  . Tobacco abuse 02/03/2018  . Hyperlipidemia 02/03/2018  . Acute respiratory failure with hypoxia ([place]) 02/02/2018  . Acute congestive heart failure ([place])   . Acute bronchitis 12/28/2015  . Atypical chest pain 12/27/2015  . Essential hypertension   .  Neuropathy   . Viral illness     Current Outpatient Medications:  .  albuterol (PROVENTIL) (2.5 MG/3ML) 0.083% nebulizer solution, Take 2.5 mg by nebulization every 6 (six) hours as needed for wheezing or shortness of breath., Disp: , Rfl:  .  albuterol (VENTOLIN HFA) 108 (90 Base) MCG/ACT inhaler, Inhale 2 puffs into the lungs every 6 (six) hours as needed for wheezing or shortness of breath., Disp: , Rfl:  .  aspirin 81 MG chewable tablet, Chew 1 tablet (81 mg total) by mouth daily., Disp: 90 tablet, Rfl: 3 .  atorvastatin (LIPITOR) 80 MG tablet, Take 1 tablet (80 mg total) by mouth daily., Disp: 90 tablet, Rfl: 3 .  clopidogrel (PLAVIX) 75 MG tablet, Take 1 tablet (75 mg total) by mouth daily with breakfast., Disp: 90 tablet, Rfl: 3 .  dapagliflozin propanediol (FARXIGA) 10 MG TABS tablet, Take 10 mg by mouth daily before breakfast., Disp: 30 tablet, Rfl: 6 .  enalapril (VASOTEC) 20 MG tablet, Take 20 mg by mouth 2 (two) times daily., Disp: , Rfl:  .  fluticasone (FLONASE) 50 MCG/ACT nasal spray, Place 1 spray into both nostrils daily., Disp: 18.2 mL, Rfl: 2 .  furosemide (LASIX) 40 MG tablet, Take 1 tablet (40 mg total) by mouth 2 (two) times daily., Disp: 180 tablet, Rfl: 3 .  isosorbide mononitrate (IMDUR) 30 MG 24 hr tablet, Take 1 tablet (30 mg total) by mouth daily., Disp: 90 tablet, Rfl: 3 .  Multiple Vitamin (MULTIVITAMIN WITH MINERALS) TABS tablet, Take 1 tablet  by mouth daily., Disp: , Rfl:  .  pantoprazole (PROTONIX) 40 MG tablet, Take 1 tablet (40 mg total) by mouth daily., Disp: 90 tablet, Rfl: 0 .  potassium chloride SA (KLOR-CON) 20 MEQ tablet, TAKE 1 TABLET(20 MEQ) BY MOUTH DAILY, Disp: 90 tablet, Rfl: 3 .  spironolactone (ALDACTONE) 25 MG tablet, Take 0.5 tablets (12.5 mg total) by mouth daily., Disp: 45 tablet, Rfl: 3 .  baclofen (LIORESAL) 10 MG tablet, Take 0.5-1 tablets (5-10 mg total) by mouth 3 (three) times daily as needed for muscle spasms., Disp: 30 each, Rfl: 3 .   nicotine polacrilex (SM NICOTINE POLACRILEX) 4 MG lozenge, Take 1 lozenge (4 mg total) by mouth as needed for smoking cessation. (Patient not taking: Reported on 06/12/2019), Disp: 100 tablet, Rfl: 2 .  predniSONE (DELTASONE) 50 MG tablet, Take one tablet by mouth once daily for 5 days. (Patient not taking: Reported on 06/12/2019), Disp: 5 tablet, Rfl: 0 .  pregabalin (LYRICA) 100 MG capsule, Take 1 tablet three times daily, Disp: 270 capsule, Rfl: 3 .  Tafamidis (VYNDAMAX) 61 MG CAPS, Take 61 mg by mouth daily., Disp: , Rfl:  .  valACYclovir (VALTREX) 500 MG tablet, Take 1 tablet (500 mg total) by mouth daily. (Patient not taking: Reported on 06/19/2019), Disp: 90 tablet, Rfl: 3 Allergies  Allergen Reactions  . Varenicline Other (See Comments)    Patient reports laryngospasm stopped in the ER     Social History   Socioeconomic History  . Marital status: Divorced    Spouse name: Not on file  . Number of children: 2  . Years of education: 10  . Highest education level: Not on file  Occupational History  . Occupation: not employed  Tobacco Use  . Smoking status: Current Every Day Smoker    Packs/day: 0.50    Years: 54.00    Pack years: 27.00    Types: Cigarettes, Cigars  . Smokeless tobacco: Never Used  Substance and Sexual Activity  . Alcohol use: Yes    Alcohol/week: 1.0 standard drinks    Types: 1 Cans of beer per week  . Drug use: Yes    Comment: last use may last year  . Sexual activity: Yes  Other Topics Concern  . Not on file  Social History Narrative   Right handed   Two story home   No caffeine   Social Determinants of Health   Financial Resource Strain: Low Risk   . Difficulty of Paying Living Expenses: Not very hard  Food Insecurity: No Food Insecurity  . Worried About Charity fundraiser in the Last Year: Never true  . Ran Out of Food in the Last Year: Never true  Transportation Needs: No Transportation Needs  . Lack of Transportation (Medical): No  . Lack  of Transportation (Non-Medical): No  Physical Activity: Insufficiently Active  . Days of Exercise per Week: 2 days  . Minutes of Exercise per Session: 20 min  Stress: No Stress Concern Present  . Feeling of Stress : Not at all  Social Connections: Somewhat Isolated  . Frequency of Communication with Friends and Family: More than three times a week  . Frequency of Social Gatherings with Friends and Family: Twice a week  . Attends Religious Services: 1 to 4 times per year  . Active Member of Clubs or Organizations: No  . Attends Archivist Meetings: Never  . Marital Status: Divorced  Human resources officer Violence: Not At Risk  . Fear of Current or  Ex-Partner: No  . Emotionally Abused: No  . Physically Abused: No  . Sexually Abused: No    Physical Exam Vitals reviewed.  Constitutional:      Appearance: He is normal weight.  HENT:     Head: Normocephalic.     Nose: Nose normal.     Mouth/Throat:     Mouth: Mucous membranes are moist.     Pharynx: Oropharynx is clear.  Eyes:     Pupils: Pupils are equal, round, and reactive to light.  Cardiovascular:     Rate and Rhythm: Normal rate and regular rhythm.     Pulses: Normal pulses.  Pulmonary:     Effort: Pulmonary effort is normal.     Breath sounds: Normal breath sounds.  Abdominal:     Palpations: Abdomen is soft.  Musculoskeletal:        General: Normal range of motion.     Cervical back: Normal range of motion.     Right lower leg: No edema.     Left lower leg: No edema.     Comments: Left big toe nail removed a week ago, redness and pain at the site.   Skin:    General: Skin is warm and dry.     Capillary Refill: Capillary refill takes less than 2 seconds.  Neurological:     Mental Status: He is alert. Mental status is at baseline.  Psychiatric:        Mood and Affect: Mood normal.    Arrived for home visit for Athony who was alert and oriented seated in his home. Omarian stated he has been having a lot of  back pain as well as left toe pain. Kimber was given Baclofen for back pain and he reports he has been using it for same with some relief. Vitals were obtained and are as noted in report. Demondre requested follow up with his PCP [name]. I will schedule same. Meds were reviewed and confirmed. Pill box filled accordingly.  Mr. Mercure continues to ask for 90 day supply for his meds, same is written in RX. I will discuss same with Pharmacy. Tyran agreed to home visit in one week. Home visit complete.   Refills; Pregabalin  Plavix      Future Appointments  Date Time Provider [place]  07/18/2019 11:15 AM [facility] None  07/19/2019 10:45 AM [facility]  07/20/2019 11:15 AM [facility] PH[facility] II EXC [facility] None  08/03/2019  9:40 AM [name], [name], MD [facility] None  08/16/2019 10:45 AM [facility]  08/21/2019  3:15 PM Gardiner Barefoot, DPM [facility]     ACTION: Home visit completed Next visit planned for one week

## 2019-07-18 ENCOUNTER — Ambulatory Visit (HOSPITAL_COMMUNITY): Payer: Medicaid Other

## 2019-07-18 ENCOUNTER — Other Ambulatory Visit: Payer: Self-pay | Admitting: Family Medicine

## 2019-07-18 ENCOUNTER — Telehealth (HOSPITAL_COMMUNITY): Payer: Self-pay | Admitting: Family Medicine

## 2019-07-18 ENCOUNTER — Encounter (HOSPITAL_COMMUNITY): Payer: Medicaid Other

## 2019-07-18 DIAGNOSIS — G8929 Other chronic pain: Secondary | ICD-10-CM

## 2019-07-19 ENCOUNTER — Ambulatory Visit: Payer: Medicaid Other | Attending: Critical Care Medicine

## 2019-07-19 ENCOUNTER — Encounter (HOSPITAL_COMMUNITY): Payer: Self-pay

## 2019-07-19 ENCOUNTER — Other Ambulatory Visit: Payer: Self-pay

## 2019-07-19 ENCOUNTER — Ambulatory Visit (HOSPITAL_COMMUNITY)
Admission: EM | Admit: 2019-07-19 | Discharge: 2019-07-19 | Disposition: A | Discharge status: Home / Self Care | Payer: Medicaid Other | Attending: Urgent Care | Admitting: Urgent Care

## 2019-07-19 DIAGNOSIS — L03032 Cellulitis of left toe: Secondary | ICD-10-CM

## 2019-07-19 DIAGNOSIS — M79675 Pain in left toe(s): Secondary | ICD-10-CM

## 2019-07-19 DIAGNOSIS — Z23 Encounter for immunization: Secondary | ICD-10-CM

## 2019-07-19 MED ORDER — CEPHALEXIN 500 MG PO CAPS
500.0000 mg | ORAL_CAPSULE | Freq: Three times a day (TID) | ORAL | 0 refills | Status: DC
Start: 2019-07-19 — End: 2019-07-29

## 2019-07-19 NOTE — ED Triage Notes (Signed)
Pt presents to UC with pain in left big toe x 3 days. Pt reports he had the left big toe nail removed 7 days ago. Pt is using Neosporin.
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
## 2019-07-19 NOTE — Discharge Instructions (Signed)
Please change your dressing 2-3 times daily. Do not apply any ointments or creams. Each time you change your dressing, make sure you clean gently around the perimeter of the wound with gentle soap and warm water. Pat your wound dry and let it air out if possible for 1-2 hours before reapplying another dressing. Once your wound heals over and stops draining for 2-3 days, it is ok to not cover it as often except at bedtime.

## 2019-07-19 NOTE — Progress Notes (Signed)
   Covid-19 Vaccination Clinic  Name:  Russell Collier    MRN: [id number] DOB: 06-Apr-1960  07/19/2019  [name] was observed post Covid-19 immunization for 15 minutes without incident. He was provided with Vaccine Information Sheet and instruction to access the V-Safe system.   [name] was instructed to call 911 with any severe reactions post vaccine: Marland Kitchen Difficulty breathing  . Swelling of face and throat  . A fast heartbeat  . A bad rash all over body  . Dizziness and weakness   Immunizations Administered    Name Date Dose VIS Date Route   Moderna COVID-19 Vaccine 07/19/2019 10:15 AM 0.5 mL 01/2019 Intramuscular   Manufacturer: Moderna   Lot: [id number]   [id number]

## 2019-07-20 ENCOUNTER — Ambulatory Visit (HOSPITAL_COMMUNITY): Payer: Medicaid Other

## 2019-07-20 ENCOUNTER — Encounter (HOSPITAL_COMMUNITY)
Admission: RE | Admit: 2019-07-20 | Discharge: 2019-07-20 | Disposition: A | Discharge status: Home / Self Care | Payer: Medicaid Other | Source: Ambulatory Visit | Attending: Internal Medicine | Admitting: Internal Medicine

## 2019-07-20 VITALS — Ht 69.0 in | Wt 317.9 lb

## 2019-07-20 DIAGNOSIS — Z955 Presence of coronary angioplasty implant and graft: Secondary | ICD-10-CM | POA: Diagnosis not present

## 2019-07-20 DIAGNOSIS — I25110 Atherosclerotic heart disease of native coronary artery with unstable angina pectoris: Secondary | ICD-10-CM | POA: Diagnosis not present

## 2019-07-20 LAB — GLUCOSE, CAPILLARY: Glucose-Capillary: 130 mg/dL — ABNORMAL HIGH (ref 70–99)

## 2019-07-20 NOTE — Progress Notes (Signed)
Discharge Progress Report  Patient Details  Name: Russell Collier MRN: [id number] Date of Birth: 05-22-1960 Referring Provider:     [facility] from 05/24/2019 in [place]  Referring Provider  Glori Bickers MD       Number of Visits: 17  Reason for Discharge:  Patient has met program and personal goals.  Smoking History:  Social History   Tobacco Use  Smoking Status Current Every Day Smoker  . Packs/day: 0.50  . Years: 54.00  . Pack years: 27.00  . Types: Cigarettes, Cigars  Smokeless Tobacco Never Used    Diagnosis:  Status post coronary artery stent placement  ADL UCSD:   Initial Exercise Prescription: Initial Exercise Prescription - 05/24/19 1500      Date of Initial Exercise RX and Referring Provider   Date  05/24/19    Referring Provider  Glori Bickers MD    Expected Discharge Date  07/20/19      NuStep   Level  2    SPM  75    Minutes  25    METs  1.2      Prescription Details   Frequency (times per week)  3x    Duration  Progress to 30 minutes of continuous aerobic without signs/symptoms of physical distress      Intensity   THRR 40-80% of Max Heartrate  64-128    Ratings of Perceived Exertion  11-13    Perceived Dyspnea  0-4      Progression   Progression  Continue progressive overload as per policy without signs/symptoms or physical distress.      Resistance Training   Training Prescription  Yes    Weight  3lbs    Reps  10-15       Discharge Exercise Prescription (Final Exercise Prescription Changes): Exercise Prescription Changes - 07/11/19 0904      Response to Exercise   Blood Pressure (Admit)  102/60    Blood Pressure (Exercise)  118/60    Blood Pressure (Exit)  102/60    Heart Rate (Admit)  78 bpm    Heart Rate (Exercise)  118 bpm    Heart Rate (Exit)  76 bpm    Rating of Perceived Exertion (Exercise)  12    Perceived Dyspnea (Exercise)  0    Symptoms  Back Pain     Comments  Pt's last exercise session    Duration  Progress to 10 minutes continuous walking  at current work load and total walking time to 30-45 min    Intensity  THRR unchanged      Progression   Progression  Continue to progress workloads to maintain intensity without signs/symptoms of physical distress.    Average METs  2      Resistance Training   Training Prescription  No    Weight  --    Reps  --    Time  --      NuStep   Level  3    SPM  85    Minutes  20    METs  2      Track   Laps  --    Minutes  --    METs  --       Functional Capacity: 6 Minute Walk    Row Name 05/24/19 1522 05/24/19 1524 07/26/19 0858     6 Minute Walk   Phase  Initial  --  Discharge   Distance  800  feet  --  600 feet   Distance % Change  --  --  -25 %   Distance Feet Change  --  --  -200 ft   Walk Time  6 minutes  --  6 minutes Total Walk Time: 2.5 minutes   # of Rest Breaks  1  --  1   MPH  1.5  --  1.1   METS  1.5  --  1   RPE  15  --  15   Perceived Dyspnea   1  --  0   VO2 Peak  5.1  --  --   Symptoms  Yes (comment)  --  Yes (comment)   Comments  10/10 back pain, SOB +1  10/10 back pain, SOB +1. Pt stopped test at 5 minutes due to back pain.  10/10 back pain, Pt stopped test at 3 minutes due to back pain. Pt also took 1 30 second rest break.   Resting HR  76 bpm  --  94 bpm   Resting BP  108/70  --  106/56   Resting Oxygen Saturation   98 %  --  --   Exercise Oxygen Saturation  during 6 min walk  97 %  --  --   Max Ex. HR  102 bpm  --  106 bpm   Max Ex. BP  152/70  --  120/60   2 Minute Post BP  102/60  --  104/72      Psychological, QOL, Others - Outcomes: PHQ 2/9: Depression screen [facility] 2/9 07/20/2019 05/24/2019 04/16/2019  Decreased Interest 0 0 0  Down, Depressed, Hopeless 0 0 0  PHQ - 2 Score 0 0 0  Some recent data might be hidden    Quality of Life: Quality of Life - 07/26/19 0857      Quality of Life Scores   Health/Function Pre  28.29 %    Health/Function  Post  29.08 %    Health/Function % Change  2.79 %    Socioeconomic Pre  30 %    Socioeconomic Post  30 %    Socioeconomic % Change   0 %    Psych/Spiritual Post  30 %    Family Pre  30 %    Family Post  30 %    Family % Change  0 %    GLOBAL Pre  29.03 %    GLOBAL Post  29.6 %    GLOBAL % Change  1.96 %       Personal Goals: Goals established at orientation with interventions provided to work toward goal. Personal Goals and Risk Factors at Admission - 05/24/19 1529      Core Components/Risk Factors/Patient Goals on Admission    Weight Management  Yes;Obesity    Intervention  Weight Management: Provide education and appropriate resources to help participant work on and attain dietary goals.;Weight Management: Develop a combined nutrition and exercise program designed to reach desired caloric intake, while maintaining appropriate intake of nutrient and fiber, sodium and fats, and appropriate energy expenditure required for the weight goal.;Weight Management/Obesity: Establish reasonable short term and long term weight goals.;Obesity: Provide education and appropriate resources to help participant work on and attain dietary goals.    Diabetes  Yes    Intervention  Provide education about proper nutrition, including hydration, and aerobic/resistive exercise prescription along with prescribed medications to achieve blood glucose in normal ranges: Fasting glucose 65-99 mg/dL;Provide education about signs/symptoms and action to take  for hypo/hyperglycemia.    Expected Outcomes  Short Term: Participant verbalizes understanding of the signs/symptoms and immediate care of hyper/hypoglycemia, proper foot care and importance of medication, aerobic/resistive exercise and nutrition plan for blood glucose control.;Long Term: Attainment of HbA1C < 7%.    Hypertension  Yes    Intervention  Provide education on lifestyle modifcations including regular physical activity/exercise, weight management, moderate  sodium restriction and increased consumption of fresh fruit, vegetables, and low fat dairy, alcohol moderation, and smoking cessation.;Monitor prescription use compliance.    Expected Outcomes  Short Term: Continued assessment and intervention until BP is < 140/21m HG in hypertensive participants. < 130/885mHG in hypertensive participants with diabetes, heart failure or chronic kidney disease.;Long Term: Maintenance of blood pressure at goal levels.    Lipids  Yes    Intervention  Provide education and support for participant on nutrition & aerobic/resistive exercise along with prescribed medications to achieve LDL <7044mHDL >11m13m  Expected Outcomes  Short Term: Participant states understanding of desired cholesterol values and is compliant with medications prescribed. Participant is following exercise prescription and nutrition guidelines.;Long Term: Cholesterol controlled with medications as prescribed, with individualized exercise RX and with personalized nutrition plan. Value goals: LDL < 70mg23mL > 40 mg.        Personal Goals Discharge: Goals and Risk Factor Review    Row Name 06/28/19 1502 06/28/19 1504 07/31/19 1505         Core Components/Risk Factors/Patient Goals Review   Personal Goals Review  Weight Management/Obesity;Lipids;Diabetes;Hypertension;Tobacco Cessation  Weight Management/Obesity;Lipids;Diabetes;Hypertension;Tobacco Cessation  Weight Management/Obesity;Lipids;Diabetes;Hypertension;Tobacco Cessation     Review  RoberAquanines  RoberDaeshonines to smoke. Cessation encouraged. Roberts vital signs and CBG's have been stable. RoberKardellbeen walking the track and taking rest breaks as needed doing well with exercise.  Valerian contines to smoke. Cessation encouraged. Roberts vital signs and CBG's have been stable. Jihaad completed cardiac rehab on 07/20/19. Rob was unable to complete his walk test due to chronic back pain     Expected Outcomes  --  RoberYavier continue to  participate in phase 2 cardiac rehab for exercise, nutrtion and lifestyle modifications  RoberAbdulhadis receive physical therapy in the near future. Encouraged smoking cessatoin and risk factor modificatoin upon discharge from phase 2 cardiac rehab.        Exercise Goals and Review: Exercise Goals    Row Name 05/24/19 1526             Exercise Goals   Increase Physical Activity  Yes       Intervention  Provide advice, education, support and counseling about physical activity/exercise needs.;Develop an individualized exercise prescription for aerobic and resistive training based on initial evaluation findings, risk stratification, comorbidities and participant's personal goals.       Expected Outcomes  Short Term: Attend rehab on a regular basis to increase amount of physical activity.;Long Term: Add in home exercise to make exercise part of routine and to increase amount of physical activity.;Long Term: Exercising regularly at least 3-5 days a week.       Increase Strength and Stamina  Yes       Intervention  Provide advice, education, support and counseling about physical activity/exercise needs.;Develop an individualized exercise prescription for aerobic and resistive training based on initial evaluation findings, risk stratification, comorbidities and participant's personal goals.       Expected Outcomes  Short Term: Increase workloads from initial exercise prescription for resistance, speed, and METs.;Short Term:  Perform resistance training exercises routinely during rehab and add in resistance training at home;Long Term: Improve cardiorespiratory fitness, muscular endurance and strength as measured by increased METs and functional capacity (6MWT)       Able to understand and use rate of perceived exertion (RPE) scale  Yes       Intervention  Provide education and explanation on how to use RPE scale       Expected Outcomes  Short Term: Able to use RPE daily in rehab to express subjective  intensity level;Long Term:  Able to use RPE to guide intensity level when exercising independently       Knowledge and understanding of Target Heart Rate Range (THRR)  Yes       Intervention  Provide education and explanation of THRR including how the numbers were predicted and where they are located for reference       Expected Outcomes  Short Term: Able to state/look up THRR;Long Term: Able to use THRR to govern intensity when exercising independently;Short Term: Able to use daily as guideline for intensity in rehab       Able to check pulse independently  Yes       Intervention  Provide education and demonstration on how to check pulse in carotid and radial arteries.;Review the importance of being able to check your own pulse for safety during independent exercise       Expected Outcomes  Short Term: Able to explain why pulse checking is important during independent exercise;Long Term: Able to check pulse independently and accurately       Understanding of Exercise Prescription  Yes       Intervention  Provide education, explanation, and written materials on patient's individual exercise prescription       Expected Outcomes  Short Term: Able to explain program exercise prescription;Long Term: Able to explain home exercise prescription to exercise independently          Exercise Goals Re-Evaluation: Exercise Goals Re-Evaluation    Row Name 07/26/19 0912             Exercise Goal Re-Evaluation   Exercise Goals Review  Increase Physical Activity;Increase Strength and Stamina;Able to understand and use rate of perceived exertion (RPE) scale;Knowledge and understanding of Target Heart Rate Range (THRR);Able to check pulse independently;Understanding of Exercise Prescription       Comments  Pt completed 17 sessions of cardiac rehab. Pt was making slow progress in rehab. Pt's chronic back pain has progressively gotten worse during rehab and pt struggled heavily with exercise. Pt tried to complete  post 6MWT, but was unable to complete and show possible progression. Pt had been able to walk 5-8 laps during a 15 minute session around the track with rest. During post 6MWT pt was able to walk 3 minutes.       Expected Outcomes  Pt will incorporate exercise at home, once he has completed physcial therapy and is his chronic back pain managed better.          Nutrition & Weight - Outcomes: Pre Biometrics - 05/24/19 1526      Pre Biometrics   Height  5' 9"  (1.753 m)    Weight  (!) 147.7 kg    Waist Circumference  52 inches    Hip Circumference  55.5 inches    Waist to Hip Ratio  0.94 %    BMI (Calculated)  48.06    Triceps Skinfold  52 mm    % Body Fat  45.9 %    Grip Strength  57 kg    Flexibility  0 in    Single Leg Stand  13.56 seconds      Post Biometrics - 07/26/19 0904       Post  Biometrics   Height  5' 9"  (1.753 m)    Weight  (!) 144.2 kg    Waist Circumference  53 inches    Hip Circumference  54 inches    Waist to Hip Ratio  0.98 %    BMI (Calculated)  46.92    Triceps Skinfold  52 mm    % Body Fat  45.9 %    Grip Strength  52 kg    Flexibility  0 in    Single Leg Stand  0 seconds       Nutrition: Nutrition Therapy & Goals - 06/06/19 1458      Nutrition Therapy   Diet  Heart Healthy/carb modified      Personal Nutrition Goals   Nutrition Goal  Pt to identify food quantities necessary to achieve weight loss of 6-24 lb at graduation from cardiac rehab.    Personal Goal #2  Pt to decrease carbohydrates consumed at each meal and snack to <75 g      Intervention Plan   Intervention  Prescribe, educate and counsel regarding individualized specific dietary modifications aiming towards targeted core components such as weight, hypertension, lipid management, diabetes, heart failure and other comorbidities.;Nutrition handout(s) given to patient.    Expected Outcomes  Short Term Goal: A plan has been developed with personal nutrition goals set during dietitian  appointment.;Long Term Goal: Adherence to prescribed nutrition plan.       Nutrition Discharge: Nutrition Assessments - 07/26/19 1443      MEDFICTS Scores   Post Score  --   Did not complete survey      Education Questionnaire Score: Knowledge Questionnaire Score - 07/26/19 0857      Knowledge Questionnaire Score   Pre Score  17/24    Post Score  22/28       Goals reviewed with patient; copy given to patient. Pt graduated from cardiac rehab program on 07/20/19 with completion of 17 exercise sessions in Phase II. Pt maintained fair attendance while in the porgram.   Medication list reconciled. Repeat  PHQ score- 0 . Shamir did have continued chronic back pain and actually decreased his distance on his post exercise walk test. Avigdor continues to smoke and was encouraged to quit. Hitesh did have an ingrown infected toenail and missed some exercise sessions towards the end of his cardiac rehab. Barnet Pall, RN,BSN 07/31/2019 3:22 PM

## 2019-07-24 ENCOUNTER — Other Ambulatory Visit (HOSPITAL_COMMUNITY): Payer: Self-pay

## 2019-07-24 DIAGNOSIS — M1711 Unilateral primary osteoarthritis, right knee: Secondary | ICD-10-CM | POA: Insufficient documentation

## 2019-07-24 DIAGNOSIS — Z6841 Body Mass Index (BMI) 40.0 and over, adult: Secondary | ICD-10-CM | POA: Diagnosis not present

## 2019-07-24 NOTE — Progress Notes (Signed)
Paramedicine Encounter    Patient ID: Russell Collier, male    DOB: Nov 21, 1960, 59 y.o.   MRN: [id number]   Patient Care Team: [name], MD as PCP - General (Family Medicine) [name], MD as PCP - Cardiology (Cardiology) Michael Boston, MD as Consulting Physician (General Surgery) Wilford Corner, MD as Consulting Physician (Gastroenterology) Jorge Ny, LCSW as Social Worker (Licensed Clinical Social Worker) Alda Berthold, DO as Consulting Physician (Neurology)  Patient Active Problem List   Diagnosis Date Noted  . Breast tenderness in male 05/31/2019  . Chronic pain 05/31/2019  . AKI (acute kidney injury) ([place]) 05/31/2019  . Pain due to onychomycosis of toenails of both feet 05/16/2019  . Chronic knee pain 04/16/2019  . Diabetes ([place]) 04/16/2019  . Esophageal reflux 02/06/2019  . Heartburn 02/06/2019  . Chronic skin ulcer of lower leg ([place]) 12/21/2018  . S/P drug eluting coronary stent placement   . Coronary artery disease involving native coronary artery of native heart with unstable angina pectoris ([place]) 12/20/2018  . Unstable angina ([place])   . 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 Acute respiratory distress 10/16/2018  . Acute on chronic diastolic heart failure ([place])   . Shortness of breath   . Precordial chest pain   . Idiopathic peripheral neuropathy   . Palpitations   . Chronic diastolic heart failure ([place])   . Abnormal ankle brachial index (ABI)   . Chest pain, rule out acute myocardial infarction 09/26/2018  . Dyspepsia   . Morbid obesity ([place])   . OSA on CPAP   . Chest pain 05/30/2018  . CAD (coronary artery disease) 03/30/2018  . Elevated troponin 02/03/2018  . Positive urine drug screen 02/03/2018  . Tobacco abuse 02/03/2018  . Hyperlipidemia 02/03/2018  . Acute respiratory failure with hypoxia ([place]) 02/02/2018  . Acute congestive heart failure ([place])   . Acute bronchitis 12/28/2015  . Atypical chest pain 12/27/2015  . Essential hypertension   .  Neuropathy   . Viral illness     Current Outpatient Medications:  .  albuterol (PROVENTIL) (2.5 MG/3ML) 0.083% nebulizer solution, Take 2.5 mg by nebulization every 6 (six) hours as needed for wheezing or shortness of breath., Disp: , Rfl:  .  albuterol (VENTOLIN HFA) 108 (90 Base) MCG/ACT inhaler, Inhale 2 puffs into the lungs every 6 (six) hours as needed for wheezing or shortness of breath., Disp: , Rfl:  .  aspirin 81 MG chewable tablet, Chew 1 tablet (81 mg total) by mouth daily., Disp: 90 tablet, Rfl: 3 .  atorvastatin (LIPITOR) 80 MG tablet, Take 1 tablet (80 mg total) by mouth daily., Disp: 90 tablet, Rfl: 3 .  baclofen (LIORESAL) 10 MG tablet, Take 0.5-1 tablets (5-10 mg total) by mouth 3 (three) times daily as needed for muscle spasms., Disp: 30 each, Rfl: 3 .  cephALEXin (KEFLEX) 500 MG capsule, Take 1 capsule (500 mg total) by mouth 3 (three) times daily., Disp: 30 capsule, Rfl: 0 .  clopidogrel (PLAVIX) 75 MG tablet, Take 1 tablet (75 mg total) by mouth daily with breakfast., Disp: 90 tablet, Rfl: 3 .  dapagliflozin propanediol (FARXIGA) 10 MG TABS tablet, Take 10 mg by mouth daily before breakfast., Disp: 30 tablet, Rfl: 6 .  enalapril (VASOTEC) 20 MG tablet, Take 20 mg by mouth 2 (two) times daily., Disp: , Rfl:  .  fluticasone (FLONASE) 50 MCG/ACT nasal spray, Place 1 spray into both nostrils daily., Disp: 18.2 mL, Rfl: 2 .  furosemide (LASIX) 40 MG tablet,  Take 1 tablet (40 mg total) by mouth 2 (two) times daily., Disp: 180 tablet, Rfl: 3 .  isosorbide mononitrate (IMDUR) 30 MG 24 hr tablet, Take 1 tablet (30 mg total) by mouth daily., Disp: 90 tablet, Rfl: 3 .  Multiple Vitamin (MULTIVITAMIN WITH MINERALS) TABS tablet, Take 1 tablet by mouth daily., Disp: , Rfl:  .  pantoprazole (PROTONIX) 40 MG tablet, Take 1 tablet (40 mg total) by mouth daily., Disp: 90 tablet, Rfl: 0 .  potassium chloride SA (KLOR-CON) 20 MEQ tablet, TAKE 1 TABLET(20 MEQ) BY MOUTH DAILY, Disp: 90 tablet, Rfl:  3 .  pregabalin (LYRICA) 100 MG capsule, Take 1 tablet three times daily, Disp: 270 capsule, Rfl: 3 .  spironolactone (ALDACTONE) 25 MG tablet, Take 0.5 tablets (12.5 mg total) by mouth daily., Disp: 45 tablet, Rfl: 3 .  nicotine polacrilex (SM NICOTINE POLACRILEX) 4 MG lozenge, Take 1 lozenge (4 mg total) by mouth as needed for smoking cessation. (Patient not taking: Reported on 06/12/2019), Disp: 100 tablet, Rfl: 2 .  predniSONE (DELTASONE) 50 MG tablet, Take one tablet by mouth once daily for 5 days. (Patient not taking: Reported on 06/12/2019), Disp: 5 tablet, Rfl: 0 .  Tafamidis (VYNDAMAX) 61 MG CAPS, Take 61 mg by mouth daily., Disp: , Rfl:  .  valACYclovir (VALTREX) 500 MG tablet, Take 1 tablet (500 mg total) by mouth daily. (Patient not taking: Reported on 06/19/2019), Disp: 90 tablet, Rfl: 3 Allergies  Allergen Reactions  . Varenicline Other (See Comments)    Patient reports laryngospasm stopped in the ER     Social History   Socioeconomic History  . Marital status: Divorced    Spouse name: Not on file  . Number of children: 2  . Years of education: 10  . Highest education level: Not on file  Occupational History  . Occupation: not employed  Tobacco Use  . Smoking status: Current Every Day Smoker    Packs/day: 0.50    Years: 54.00    Pack years: 27.00    Types: Cigarettes, Cigars  . Smokeless tobacco: Never Used  Substance and Sexual Activity  . Alcohol use: Yes    Alcohol/week: 1.0 standard drinks    Types: 1 Cans of beer per week  . Drug use: Yes    Comment: last use may last year  . Sexual activity: Yes  Other Topics Concern  . Not on file  Social History Narrative   Right handed   Two story home   No caffeine   Social Determinants of Health   Financial Resource Strain: Low Risk   . Difficulty of Paying Living Expenses: Not very hard  Food Insecurity: No Food Insecurity  . Worried About Charity fundraiser in the Last Year: Never true  . Ran Out of Food in  the Last Year: Never true  Transportation Needs: No Transportation Needs  . Lack of Transportation (Medical): No  . Lack of Transportation (Non-Medical): No  Physical Activity: Insufficiently Active  . Days of Exercise per Week: 2 days  . Minutes of Exercise per Session: 20 min  Stress: No Stress Concern Present  . Feeling of Stress : Not at all  Social Connections: Somewhat Isolated  . Frequency of Communication with Friends and Family: More than three times a week  . Frequency of Social Gatherings with Friends and Family: Twice a week  . Attends Religious Services: 1 to 4 times per year  . Active Member of Clubs or Organizations: No  .  Attends Archivist Meetings: Never  . Marital Status: Divorced  Human resources officer Violence: Not At Risk  . Fear of Current or Ex-Partner: No  . Emotionally Abused: No  . Physically Abused: No  . Sexually Abused: No    Physical Exam Vitals reviewed.  HENT:     Head: Normocephalic.     Nose: Nose normal.     Mouth/Throat:     Mouth: Mucous membranes are moist.  Eyes:     Pupils: Pupils are equal, round, and reactive to light.  Cardiovascular:     Rate and Rhythm: Normal rate and regular rhythm.  Pulmonary:     Effort: Pulmonary effort is normal.     Breath sounds: Normal breath sounds.  Abdominal:     Palpations: Abdomen is soft.  Musculoskeletal:        General: Normal range of motion.     Cervical back: Normal range of motion.     Right lower leg: No edema.     Left lower leg: No edema.  Skin:    General: Skin is warm and dry.     Capillary Refill: Capillary refill takes less than 2 seconds.  Neurological:     Mental Status: He is alert. Mental status is at baseline.  Psychiatric:        Mood and Affect: Mood normal.      Arrived for home visit for Rhody who was alert and oriented complaining about knee pain after having injections done today at Dr. Celedonio Miyamoto office. Aaronjames states he hasn't slept much the last few days  and also has not been eating a lot. I informed Mr. Ursin he needs to be sure to eat and get some rest, he agreed with same. Vitals were obtained as noted. Medications were reviewed and confirmed. Pill box filled accordingly. I educated [name] on his Mobic, Ceflex. He verbalized understanding. Mr. Bastien agreed to visit in one week. Home visit complete.   Refills: -Farxiga -Pantoprazole    Future Appointments  Date Time Provider [place]  08/03/2019  9:40 AM [name], [name], MD [facility] None  08/06/2019  1:30 PM Carney Living, PT [facility]  08/16/2019 10:00 AM [place] [facility]  08/21/2019  3:15 PM Gardiner Barefoot, DPM [facility]     ACTION: Home visit completed Next visit planned for one week

## 2019-07-25 MED ORDER — VALACYCLOVIR HCL 500 MG PO TABS: 500.0000 mg | ORAL_TABLET | Freq: Every day | ORAL | 3 refills | Status: DC

## 2019-07-25 NOTE — Telephone Encounter (Signed)
Received message about refilling Valtrex.  Refill sent to patient's pharmacy with 3 months of prescription and 3 refills.  This was sent to [facility] in [place] on [address].

## 2019-07-25 NOTE — Telephone Encounter (Signed)
-----   Message from Jeris Penta, EMT sent at 07/25/2019  4:09 PM EDT ----- Regarding: RE: Med Hey Dr. Caron Presume [facility] has not received the RX for Valtrex and he has called me multiple times demanding same. Please advise. Thank you!  ----- Message ----- From: [name], MD Sent: 06/05/2019   5:53 PM EDT To: Jeris Penta, EMT Subject: RE: Med                                        [name],  I can get those sent them tomorrow.   Christy Sartorius ----- Message ----- From: Jeris Penta, EMT Sent: 06/05/2019   5:45 PM EDT To: [name], MD Subject: Med                                            Hello Dr. Caron Presume, I apologize for continuing to bug you on medication refills for Russell Collier the last two things we are needing are refills for Valtrex (per patient he has to have this daily, please advise) and Flonase Nasal Spray. He needs Pantoprazole however it is too early to refill per pharmacy, I have informed patient of same. Please send to [facility]. Thank you.   Jeris Penta

## 2019-07-26 ENCOUNTER — Inpatient Hospital Stay (HOSPITAL_COMMUNITY)
Admission: EM | Admit: 2019-07-26 | Discharge: 2019-07-29 | DRG: 176 | Disposition: A | Discharge status: Home / Self Care | Payer: Medicaid Other | Attending: Family Medicine | Admitting: Family Medicine

## 2019-07-26 ENCOUNTER — Other Ambulatory Visit: Payer: Self-pay

## 2019-07-26 ENCOUNTER — Emergency Department (HOSPITAL_COMMUNITY): Payer: Medicaid Other

## 2019-07-26 ENCOUNTER — Encounter (HOSPITAL_COMMUNITY): Payer: Self-pay

## 2019-07-26 DIAGNOSIS — E852 Heredofamilial amyloidosis, unspecified: Secondary | ICD-10-CM

## 2019-07-26 DIAGNOSIS — E854 Organ-limited amyloidosis: Secondary | ICD-10-CM | POA: Diagnosis present

## 2019-07-26 DIAGNOSIS — I43 Cardiomyopathy in diseases classified elsewhere: Secondary | ICD-10-CM | POA: Diagnosis present

## 2019-07-26 DIAGNOSIS — Z79899 Other long term (current) drug therapy: Secondary | ICD-10-CM

## 2019-07-26 DIAGNOSIS — J9600 Acute respiratory failure, unspecified whether with hypoxia or hypercapnia: Secondary | ICD-10-CM | POA: Diagnosis present

## 2019-07-26 DIAGNOSIS — J441 Chronic obstructive pulmonary disease with (acute) exacerbation: Secondary | ICD-10-CM | POA: Diagnosis present

## 2019-07-26 DIAGNOSIS — R600 Localized edema: Secondary | ICD-10-CM

## 2019-07-26 DIAGNOSIS — F419 Anxiety disorder, unspecified: Secondary | ICD-10-CM | POA: Diagnosis present

## 2019-07-26 DIAGNOSIS — Z86711 Personal history of pulmonary embolism: Secondary | ICD-10-CM

## 2019-07-26 DIAGNOSIS — G8929 Other chronic pain: Secondary | ICD-10-CM | POA: Diagnosis present

## 2019-07-26 DIAGNOSIS — N179 Acute kidney failure, unspecified: Secondary | ICD-10-CM | POA: Diagnosis not present

## 2019-07-26 DIAGNOSIS — E860 Dehydration: Secondary | ICD-10-CM | POA: Diagnosis present

## 2019-07-26 DIAGNOSIS — Z20822 Contact with and (suspected) exposure to covid-19: Secondary | ICD-10-CM | POA: Diagnosis not present

## 2019-07-26 DIAGNOSIS — J9690 Respiratory failure, unspecified, unspecified whether with hypoxia or hypercapnia: Secondary | ICD-10-CM | POA: Diagnosis present

## 2019-07-26 DIAGNOSIS — Z7982 Long term (current) use of aspirin: Secondary | ICD-10-CM

## 2019-07-26 DIAGNOSIS — F17210 Nicotine dependence, cigarettes, uncomplicated: Secondary | ICD-10-CM | POA: Diagnosis present

## 2019-07-26 DIAGNOSIS — Z791 Long term (current) use of non-steroidal anti-inflammatories (NSAID): Secondary | ICD-10-CM

## 2019-07-26 DIAGNOSIS — R079 Chest pain, unspecified: Secondary | ICD-10-CM | POA: Diagnosis not present

## 2019-07-26 DIAGNOSIS — Z9119 Patient's noncompliance with other medical treatment and regimen: Secondary | ICD-10-CM

## 2019-07-26 DIAGNOSIS — R0789 Other chest pain: Secondary | ICD-10-CM | POA: Diagnosis not present

## 2019-07-26 DIAGNOSIS — F1410 Cocaine abuse, uncomplicated: Secondary | ICD-10-CM | POA: Diagnosis present

## 2019-07-26 DIAGNOSIS — I5032 Chronic diastolic (congestive) heart failure: Secondary | ICD-10-CM | POA: Diagnosis present

## 2019-07-26 DIAGNOSIS — Z6841 Body Mass Index (BMI) 40.0 and over, adult: Secondary | ICD-10-CM

## 2019-07-26 DIAGNOSIS — I959 Hypotension, unspecified: Secondary | ICD-10-CM | POA: Diagnosis present

## 2019-07-26 DIAGNOSIS — I499 Cardiac arrhythmia, unspecified: Secondary | ICD-10-CM | POA: Diagnosis not present

## 2019-07-26 DIAGNOSIS — E7800 Pure hypercholesterolemia, unspecified: Secondary | ICD-10-CM | POA: Diagnosis present

## 2019-07-26 DIAGNOSIS — R069 Unspecified abnormalities of breathing: Secondary | ICD-10-CM | POA: Diagnosis not present

## 2019-07-26 DIAGNOSIS — G4733 Obstructive sleep apnea (adult) (pediatric): Secondary | ICD-10-CM | POA: Diagnosis present

## 2019-07-26 DIAGNOSIS — I110 Hypertensive heart disease with heart failure: Secondary | ICD-10-CM | POA: Diagnosis present

## 2019-07-26 DIAGNOSIS — E1142 Type 2 diabetes mellitus with diabetic polyneuropathy: Secondary | ICD-10-CM | POA: Diagnosis present

## 2019-07-26 DIAGNOSIS — Z7902 Long term (current) use of antithrombotics/antiplatelets: Secondary | ICD-10-CM

## 2019-07-26 DIAGNOSIS — I2510 Atherosclerotic heart disease of native coronary artery without angina pectoris: Secondary | ICD-10-CM | POA: Diagnosis present

## 2019-07-26 DIAGNOSIS — E785 Hyperlipidemia, unspecified: Secondary | ICD-10-CM | POA: Diagnosis present

## 2019-07-26 DIAGNOSIS — I213 ST elevation (STEMI) myocardial infarction of unspecified site: Secondary | ICD-10-CM | POA: Diagnosis not present

## 2019-07-26 DIAGNOSIS — Z955 Presence of coronary angioplasty implant and graft: Secondary | ICD-10-CM

## 2019-07-26 DIAGNOSIS — L03032 Cellulitis of left toe: Secondary | ICD-10-CM | POA: Diagnosis present

## 2019-07-26 DIAGNOSIS — I2699 Other pulmonary embolism without acute cor pulmonale: Principal | ICD-10-CM | POA: Diagnosis present

## 2019-07-26 MED ORDER — HEPARIN (PORCINE) 25000 UT/250ML-% IV SOLN
1450.0000 [IU]/h | INTRAVENOUS | Status: DC
  Administered 2019-07-26: 1250 [IU]/h via INTRAVENOUS
  Administered 2019-07-27: 1400 [IU]/h via INTRAVENOUS
  Filled 2019-07-26 (×3): qty 250

## 2019-07-26 MED ORDER — SODIUM CHLORIDE 0.9 % IV SOLN: INTRAVENOUS | Status: DC

## 2019-07-26 MED ORDER — SODIUM CHLORIDE 0.9 % IV BOLUS
1000.0000 mL | Freq: Once | INTRAVENOUS | Status: AC
  Administered 2019-07-26: 1000 mL via INTRAVENOUS

## 2019-07-26 MED ORDER — HEPARIN BOLUS VIA INFUSION
4000.0000 [IU] | Freq: Once | INTRAVENOUS | Status: AC
  Administered 2019-07-26: 4000 [IU] via INTRAVENOUS
  Filled 2019-07-26: qty 4000
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
## 2019-07-26 NOTE — ED Triage Notes (Signed)
Pt arrives via [facility] c/o acute chest pain and dyspnea. Began approx 1 hour prior while in car. EMS activated Code STEMI. BP 88/62, VS otherwise WNL. EMS admin ASA 324 mg and Nitro 0.4 mg. Previous cardiac hx including stent placedment. A&Ox4, GCS 15.

## 2019-07-27 ENCOUNTER — Inpatient Hospital Stay (HOSPITAL_COMMUNITY): Payer: Medicaid Other

## 2019-07-27 DIAGNOSIS — J9600 Acute respiratory failure, unspecified whether with hypoxia or hypercapnia: Secondary | ICD-10-CM | POA: Diagnosis not present

## 2019-07-27 DIAGNOSIS — J9601 Acute respiratory failure with hypoxia: Secondary | ICD-10-CM | POA: Diagnosis not present

## 2019-07-27 DIAGNOSIS — R600 Localized edema: Secondary | ICD-10-CM | POA: Diagnosis not present

## 2019-07-27 DIAGNOSIS — E7800 Pure hypercholesterolemia, unspecified: Secondary | ICD-10-CM | POA: Diagnosis present

## 2019-07-27 DIAGNOSIS — Z955 Presence of coronary angioplasty implant and graft: Secondary | ICD-10-CM | POA: Diagnosis not present

## 2019-07-27 DIAGNOSIS — J9690 Respiratory failure, unspecified, unspecified whether with hypoxia or hypercapnia: Secondary | ICD-10-CM | POA: Diagnosis present

## 2019-07-27 DIAGNOSIS — Z6841 Body Mass Index (BMI) 40.0 and over, adult: Secondary | ICD-10-CM | POA: Diagnosis not present

## 2019-07-27 DIAGNOSIS — I2699 Other pulmonary embolism without acute cor pulmonale: Secondary | ICD-10-CM | POA: Diagnosis not present

## 2019-07-27 DIAGNOSIS — I110 Hypertensive heart disease with heart failure: Secondary | ICD-10-CM | POA: Diagnosis present

## 2019-07-27 DIAGNOSIS — R609 Edema, unspecified: Secondary | ICD-10-CM

## 2019-07-27 DIAGNOSIS — Z20822 Contact with and (suspected) exposure to covid-19: Secondary | ICD-10-CM | POA: Diagnosis not present

## 2019-07-27 DIAGNOSIS — I2510 Atherosclerotic heart disease of native coronary artery without angina pectoris: Secondary | ICD-10-CM | POA: Diagnosis present

## 2019-07-27 DIAGNOSIS — N179 Acute kidney failure, unspecified: Secondary | ICD-10-CM | POA: Diagnosis not present

## 2019-07-27 DIAGNOSIS — R079 Chest pain, unspecified: Secondary | ICD-10-CM

## 2019-07-27 DIAGNOSIS — G4733 Obstructive sleep apnea (adult) (pediatric): Secondary | ICD-10-CM | POA: Diagnosis present

## 2019-07-27 DIAGNOSIS — I5032 Chronic diastolic (congestive) heart failure: Secondary | ICD-10-CM

## 2019-07-27 DIAGNOSIS — Z9119 Patient's noncompliance with other medical treatment and regimen: Secondary | ICD-10-CM | POA: Diagnosis not present

## 2019-07-27 DIAGNOSIS — R0789 Other chest pain: Secondary | ICD-10-CM | POA: Diagnosis not present

## 2019-07-27 DIAGNOSIS — R0602 Shortness of breath: Secondary | ICD-10-CM | POA: Diagnosis not present

## 2019-07-27 DIAGNOSIS — I43 Cardiomyopathy in diseases classified elsewhere: Secondary | ICD-10-CM | POA: Diagnosis present

## 2019-07-27 DIAGNOSIS — Z7982 Long term (current) use of aspirin: Secondary | ICD-10-CM | POA: Diagnosis not present

## 2019-07-27 DIAGNOSIS — Z791 Long term (current) use of non-steroidal anti-inflammatories (NSAID): Secondary | ICD-10-CM | POA: Diagnosis not present

## 2019-07-27 DIAGNOSIS — F419 Anxiety disorder, unspecified: Secondary | ICD-10-CM | POA: Diagnosis present

## 2019-07-27 DIAGNOSIS — Z79899 Other long term (current) drug therapy: Secondary | ICD-10-CM | POA: Diagnosis not present

## 2019-07-27 DIAGNOSIS — J441 Chronic obstructive pulmonary disease with (acute) exacerbation: Secondary | ICD-10-CM | POA: Diagnosis present

## 2019-07-27 DIAGNOSIS — F17210 Nicotine dependence, cigarettes, uncomplicated: Secondary | ICD-10-CM | POA: Diagnosis present

## 2019-07-27 DIAGNOSIS — I213 ST elevation (STEMI) myocardial infarction of unspecified site: Secondary | ICD-10-CM | POA: Diagnosis not present

## 2019-07-27 DIAGNOSIS — E785 Hyperlipidemia, unspecified: Secondary | ICD-10-CM | POA: Diagnosis present

## 2019-07-27 DIAGNOSIS — E1142 Type 2 diabetes mellitus with diabetic polyneuropathy: Secondary | ICD-10-CM | POA: Diagnosis present

## 2019-07-27 DIAGNOSIS — E854 Organ-limited amyloidosis: Secondary | ICD-10-CM | POA: Diagnosis present

## 2019-07-27 DIAGNOSIS — Z7902 Long term (current) use of antithrombotics/antiplatelets: Secondary | ICD-10-CM | POA: Diagnosis not present

## 2019-07-27 DIAGNOSIS — E852 Heredofamilial amyloidosis, unspecified: Secondary | ICD-10-CM | POA: Diagnosis not present

## 2019-07-27 DIAGNOSIS — Z86711 Personal history of pulmonary embolism: Secondary | ICD-10-CM | POA: Diagnosis not present

## 2019-07-27 LAB — BASIC METABOLIC PANEL
Anion gap: 9 (ref 5–15)
BUN: 36 mg/dL — ABNORMAL HIGH (ref 8–23)
CO2: 22 mmol/L (ref 22–32)
Calcium: 8.6 mg/dL — ABNORMAL LOW (ref 8.9–10.3)
Chloride: 109 mmol/L (ref 98–111)
Creatinine, Ser: 1.61 mg/dL — ABNORMAL HIGH (ref 0.61–1.24)
GFR calc Af Amer: 53 mL/min — ABNORMAL LOW (ref >60)
GFR calc non Af Amer: 45 mL/min — ABNORMAL LOW (ref >60)
Glucose, Bld: 112 mg/dL — ABNORMAL HIGH (ref 70–99)
Potassium: 3.9 mmol/L (ref 3.5–5.1)
Sodium: 140 mmol/L (ref 135–145)

## 2019-07-27 LAB — LIPID PANEL
Cholesterol: 106 mg/dL (ref 0–200)
HDL: 27 mg/dL — ABNORMAL LOW (ref >40)
LDL Cholesterol: 47 mg/dL (ref 0–99)
Total CHOL/HDL Ratio: 3.9 RATIO
Triglycerides: 158 mg/dL — ABNORMAL HIGH (ref <150)
VLDL: 32 mg/dL (ref 0–40)

## 2019-07-27 LAB — RAPID URINE DRUG SCREEN, HOSP PERFORMED
Amphetamines: NOT DETECTED
Barbiturates: NOT DETECTED
Benzodiazepines: NOT DETECTED
Cocaine: NOT DETECTED
Opiates: NOT DETECTED
Tetrahydrocannabinol: NOT DETECTED

## 2019-07-27 LAB — PROTIME-INR
INR: 1 (ref 0.8–1.2)
Prothrombin Time: 12.6 seconds (ref 11.4–15.2)

## 2019-07-27 LAB — D-DIMER, QUANTITATIVE: D-Dimer, Quant: 0.93 ug/mL-FEU — ABNORMAL HIGH (ref 0.00–0.50)

## 2019-07-27 LAB — URINALYSIS, ROUTINE W REFLEX MICROSCOPIC
Bacteria, UA: NONE SEEN
Bilirubin Urine: NEGATIVE
Glucose, UA: >=500 mg/dL — AB
Hgb urine dipstick: NEGATIVE
Ketones, ur: NEGATIVE mg/dL
Leukocytes,Ua: NEGATIVE
Nitrite: NEGATIVE
Protein, ur: NEGATIVE mg/dL
Specific Gravity, Urine: 1.017 (ref 1.005–1.030)
pH: 6 (ref 5.0–8.0)

## 2019-07-27 LAB — HEMOGLOBIN A1C
Hgb A1c MFr Bld: 6.7 % — ABNORMAL HIGH (ref 4.8–5.6)
Mean Plasma Glucose: 145.59 mg/dL

## 2019-07-27 LAB — POC SARS CORONAVIRUS 2 AG -  ED: SARS Coronavirus 2 Ag: NEGATIVE

## 2019-07-27 LAB — COMPREHENSIVE METABOLIC PANEL
ALT: 29 U/L (ref 0–44)
AST: 23 U/L (ref 15–41)
Albumin: 3.6 g/dL (ref 3.5–5.0)
Alkaline Phosphatase: 66 U/L (ref 38–126)
Anion gap: 11 (ref 5–15)
BUN: 39 mg/dL — ABNORMAL HIGH (ref 8–23)
CO2: 19 mmol/L — ABNORMAL LOW (ref 22–32)
Calcium: 8.9 mg/dL (ref 8.9–10.3)
Chloride: 108 mmol/L (ref 98–111)
Creatinine, Ser: 1.81 mg/dL — ABNORMAL HIGH (ref 0.61–1.24)
GFR calc Af Amer: 46 mL/min — ABNORMAL LOW (ref >60)
GFR calc non Af Amer: 39 mL/min — ABNORMAL LOW (ref >60)
Glucose, Bld: 109 mg/dL — ABNORMAL HIGH (ref 70–99)
Potassium: 3.5 mmol/L (ref 3.5–5.1)
Sodium: 138 mmol/L (ref 135–145)
Total Bilirubin: 0.2 mg/dL — ABNORMAL LOW (ref 0.3–1.2)
Total Protein: 6.5 g/dL (ref 6.5–8.1)

## 2019-07-27 LAB — SARS CORONAVIRUS 2 BY RT PCR (HOSPITAL ORDER, PERFORMED IN ~~LOC~~ HOSPITAL LAB): SARS Coronavirus 2: NEGATIVE

## 2019-07-27 LAB — APTT: aPTT: 21 seconds — ABNORMAL LOW (ref 24–36)

## 2019-07-27 LAB — TROPONIN I (HIGH SENSITIVITY)
Troponin I (High Sensitivity): 16 ng/L (ref <18)
Troponin I (High Sensitivity): 14 ng/L (ref <18)

## 2019-07-27 LAB — HEPARIN LEVEL (UNFRACTIONATED)
Heparin Unfractionated: 0.34 IU/mL (ref 0.30–0.70)
Heparin Unfractionated: 0.23 IU/mL — ABNORMAL LOW (ref 0.30–0.70)
Heparin Unfractionated: 0.39 IU/mL (ref 0.30–0.70)

## 2019-07-27 LAB — ECHOCARDIOGRAM COMPLETE
Height: 69 in
Weight: 5096 oz

## 2019-07-27 LAB — CBC
HCT: 36.1 % — ABNORMAL LOW (ref 39.0–52.0)
Hemoglobin: 11.9 g/dL — ABNORMAL LOW (ref 13.0–17.0)
MCH: 30 pg (ref 26.0–34.0)
MCHC: 33 g/dL (ref 30.0–36.0)
MCV: 90.9 fL (ref 80.0–100.0)
Platelets: 240 10*3/uL (ref 150–400)
RBC: 3.97 MIL/uL — ABNORMAL LOW (ref 4.22–5.81)
RDW: 16.9 % — ABNORMAL HIGH (ref 11.5–15.5)
WBC: 9.8 10*3/uL (ref 4.0–10.5)
nRBC: 0 % (ref 0.0–0.2)

## 2019-07-27 LAB — BRAIN NATRIURETIC PEPTIDE: B Natriuretic Peptide: 50.2 pg/mL (ref 0.0–100.0)

## 2019-07-27 LAB — HIV ANTIBODY (ROUTINE TESTING W REFLEX): HIV Screen 4th Generation wRfx: NONREACTIVE

## 2019-07-27 MED ORDER — DAPAGLIFLOZIN PROPANEDIOL 10 MG PO TABS: 10.0000 mg | ORAL_TABLET | Freq: Every day | ORAL | Status: DC

## 2019-07-27 MED ORDER — PREGABALIN 100 MG PO CAPS
100.0000 mg | ORAL_CAPSULE | Freq: Three times a day (TID) | ORAL | Status: DC
  Administered 2019-07-27 – 2019-07-29 (×7): 100 mg via ORAL
  Filled 2019-07-27 (×7): qty 1

## 2019-07-27 MED ORDER — UMECLIDINIUM BROMIDE 62.5 MCG/INH IN AEPB
1.0000 | INHALATION_SPRAY | Freq: Every day | RESPIRATORY_TRACT | Status: DC
  Administered 2019-07-28 – 2019-07-29 (×2): 1 via RESPIRATORY_TRACT
  Filled 2019-07-27: qty 7

## 2019-07-27 MED ORDER — TAFAMIDIS 61 MG PO CAPS: 61.0000 mg | ORAL_CAPSULE | Freq: Every day | ORAL | Status: DC

## 2019-07-27 MED ORDER — PREDNISONE 20 MG PO TABS: 50.0000 mg | ORAL_TABLET | Freq: Every day | ORAL | Status: DC

## 2019-07-27 MED ORDER — IOHEXOL 350 MG/ML SOLN
60.0000 mL | Freq: Once | INTRAVENOUS | Status: AC | PRN
  Administered 2019-07-27: 60 mL via INTRAVENOUS

## 2019-07-27 MED ORDER — IPRATROPIUM-ALBUTEROL 0.5-2.5 (3) MG/3ML IN SOLN: 3.0000 mL | RESPIRATORY_TRACT | Status: DC | PRN

## 2019-07-27 MED ORDER — ASPIRIN 81 MG PO CHEW
81.0000 mg | CHEWABLE_TABLET | Freq: Every day | ORAL | Status: DC
  Administered 2019-07-27 – 2019-07-28 (×2): 81 mg via ORAL
  Filled 2019-07-27 (×2): qty 1

## 2019-07-27 MED ORDER — CEPHALEXIN 500 MG PO CAPS
500.0000 mg | ORAL_CAPSULE | Freq: Three times a day (TID) | ORAL | Status: DC
  Administered 2019-07-27 – 2019-07-29 (×7): 500 mg via ORAL
  Filled 2019-07-27 (×7): qty 1

## 2019-07-27 MED ORDER — ACETAMINOPHEN 325 MG PO TABS
650.0000 mg | ORAL_TABLET | ORAL | Status: DC | PRN
  Filled 2019-07-27: qty 2

## 2019-07-27 MED ORDER — ONDANSETRON HCL 4 MG/2ML IJ SOLN: 4.0000 mg | Freq: Four times a day (QID) | INTRAMUSCULAR | Status: DC | PRN

## 2019-07-27 MED ORDER — IPRATROPIUM-ALBUTEROL 0.5-2.5 (3) MG/3ML IN SOLN
3.0000 mL | RESPIRATORY_TRACT | Status: DC
  Administered 2019-07-27: 3 mL via RESPIRATORY_TRACT
  Filled 2019-07-27: qty 3

## 2019-07-27 MED ORDER — CLOPIDOGREL BISULFATE 75 MG PO TABS
75.0000 mg | ORAL_TABLET | Freq: Every day | ORAL | Status: DC
  Administered 2019-07-27 – 2019-07-29 (×3): 75 mg via ORAL
  Filled 2019-07-27 (×3): qty 1

## 2019-07-27 MED ORDER — VALACYCLOVIR HCL 500 MG PO TABS
500.0000 mg | ORAL_TABLET | Freq: Every day | ORAL | Status: DC
  Administered 2019-07-27 – 2019-07-29 (×3): 500 mg via ORAL
  Filled 2019-07-27 (×4): qty 1

## 2019-07-27 MED ORDER — PERFLUTREN LIPID MICROSPHERE
1.0000 mL | INTRAVENOUS | Status: AC | PRN
  Administered 2019-07-27: 4 mL via INTRAVENOUS
  Filled 2019-07-27: qty 10

## 2019-07-27 MED ORDER — ISOSORBIDE MONONITRATE ER 30 MG PO TB24
30.0000 mg | ORAL_TABLET | Freq: Every day | ORAL | Status: DC
  Administered 2019-07-28 – 2019-07-29 (×2): 30 mg via ORAL
  Filled 2019-07-27 (×2): qty 1

## 2019-07-27 MED ORDER — SPIRONOLACTONE 12.5 MG HALF TABLET
12.5000 mg | ORAL_TABLET | Freq: Every day | ORAL | Status: DC
  Administered 2019-07-28 – 2019-07-29 (×2): 12.5 mg via ORAL
  Filled 2019-07-27 (×2): qty 1

## 2019-07-27 MED ORDER — ATORVASTATIN CALCIUM 80 MG PO TABS
80.0000 mg | ORAL_TABLET | Freq: Every day | ORAL | Status: DC
  Administered 2019-07-27 – 2019-07-29 (×3): 80 mg via ORAL
  Filled 2019-07-27 (×3): qty 1

## 2019-07-27 MED ORDER — NITROGLYCERIN 0.4 MG SL SUBL: 0.4000 mg | SUBLINGUAL_TABLET | SUBLINGUAL | Status: DC | PRN

## 2019-07-27 MED ORDER — BACLOFEN 5 MG HALF TABLET
5.0000 mg | ORAL_TABLET | Freq: Three times a day (TID) | ORAL | Status: DC | PRN
  Filled 2019-07-27 (×2): qty 2

## 2019-07-27 MED ORDER — PANTOPRAZOLE SODIUM 40 MG PO TBEC
40.0000 mg | DELAYED_RELEASE_TABLET | Freq: Every day | ORAL | Status: DC
  Administered 2019-07-27 – 2019-07-29 (×3): 40 mg via ORAL
  Filled 2019-07-27 (×3): qty 1

## 2019-07-27 MED ORDER — FUROSEMIDE 40 MG PO TABS
40.0000 mg | ORAL_TABLET | Freq: Two times a day (BID) | ORAL | Status: DC
  Administered 2019-07-28 – 2019-07-29 (×3): 40 mg via ORAL
  Filled 2019-07-27 (×3): qty 1

## 2019-07-27 MED ORDER — ALBUTEROL SULFATE (2.5 MG/3ML) 0.083% IN NEBU: 3.0000 mL | INHALATION_SOLUTION | Freq: Four times a day (QID) | RESPIRATORY_TRACT | Status: DC | PRN

## 2019-07-27 NOTE — Progress Notes (Signed)
Bilateral lower extremity venous duplex has been completed. Preliminary results can be found in CV Proc through chart review.   07/27/19 9:52 AM Carlos Levering RVT
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
## 2019-07-27 NOTE — Plan of Care (Signed)

## 2019-07-27 NOTE — Progress Notes (Signed)
Patient refused CPAP for tonight. Patient stated he does not wear CPAP at home. RT informed patient that CPAP is available if he changes his mind. RT will monitor as needed.

## 2019-07-27 NOTE — Progress Notes (Signed)
  Echocardiogram 2D Echocardiogram has been performed with [facility].  Russell Collier 07/27/2019, 2:48 PM
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
## 2019-07-27 NOTE — ED Notes (Signed)
Barbera Setters sister [phone] looking for an update on pt

## 2019-07-27 NOTE — ED Notes (Signed)
[phone], Hassan Rowan, sister would like an update

## 2019-07-27 NOTE — Progress Notes (Addendum)
[place] for Heparin Indication: chest pain/ACS  Allergies  Allergen Reactions  . Varenicline Other (See Comments)    Patient reports laryngospasm stopped in the ER    Patient Measurements: Height: 5\' 9"  (175.3 cm) Weight: (!) 144 kg (317 lb 7.4 oz) IBW/kg (Calculated) : 70.7 Heparin Dosing Weight: 105 kg  Vital Signs: Temp: 97.9 F (36.6 C) (06/03 2337) Temp Source: Oral (06/03 2337) BP: 103/53 (06/04 0600) Pulse Rate: 56 (06/04 0600)  Labs: Recent Labs    07/26/19 2333 07/27/19 0610  APTT 21*  --   LABPROT 12.6  --   INR 1.0  --   HEPARINUNFRC  --  0.34  CREATININE 1.81*  --   TROPONINIHS 16  --     Estimated Creatinine Clearance: 60.6 mL/min (A) (by C-G formula based on SCr of 1.81 mg/dL (H)).  Assessment: Patient is 1 yom that presents to the ED with CP and SOB. Patient had a stent placed (11/2018) and there was concern for a STEMI. Pharmacy has been asked to dose heparin at this time for ACS.  Heparin level is therapeutic at 0.34 on 1250 units/hr. No bleeding noted, CBC for this morning has not been drawn yet.  Goal of Therapy:  Heparin level 0.3-0.7 units/ml Monitor platelets by anticoagulation protocol: Yes   Plan:  - Continue heparin drip at 1250 units/hr - Confirmatory heparin level in ~ 6 hours  - Monitor patient for s/s of bleeding and CBC while on heparin   Thank you for involving pharmacy in this patient's care.  Renold Genta, PharmD, BCPS Clinical Pharmacist Clinical phone for 07/27/2019 until 3p is [phone] 07/27/2019 7:06 AM  **Pharmacist phone directory can be found on [email or web address] listed under [place]**   Addendum: Confirmatory heparin level is subtherapeutic at 0.23 on 1250 units/hr. No bleeding noted, Hgb 11.9, platelets are 240. Dopplers negative for DVT. PE to be ruled out.  Spoke with RN who notes heparin drip was off for 20-30 min around 10-10:30 which may have impacted the heparin level. His  last heparin level was on the low end of normal so will increase cautiously.  Increase heparin drip to 1400 units/hr 6 hr heparin level  Renold Genta, PharmD, BCPS 1:11 PM

## 2019-07-27 NOTE — ED Notes (Signed)
Ordered breakfast--Russell Collier 

## 2019-07-27 NOTE — Progress Notes (Addendum)
[place] for Heparin Indication: 07/27/19 Small PE   Allergies  Allergen Reactions  . Varenicline Other (See Comments)    Patient reports laryngospasm stopped in the ER    Patient Measurements: Height: 5\' 9"  (175.3 cm) Weight: (!) 144.5 kg (318 lb 8 oz) IBW/kg (Calculated) : 70.7 Heparin Dosing Weight: 105 kg  Vital Signs: Temp: 97.8 F (36.6 C) (06/04 1823) Temp Source: Oral (06/04 1823) BP: 124/78 (06/04 1823) Pulse Rate: 86 (06/04 1823)  Labs: Recent Labs    07/26/19 2333 07/27/19 0610 07/27/19 0706 07/27/19 1150 07/27/19 1943  HGB  --   --   --  11.9*  --   HCT  --   --   --  36.1*  --   PLT  --   --   --  240  --   APTT 21*  --   --   --   --   LABPROT 12.6  --   --   --   --   INR 1.0  --   --   --   --   HEPARINUNFRC  --  0.34  --  0.23* 0.39  CREATININE 1.81*  --   --  1.61*  --   TROPONINIHS 16  --  14  --   --     Estimated Creatinine Clearance: 68.3 mL/min (A) (by C-G formula based on SCr of 1.61 mg/dL (H)).  Assessment: Patient is 59 yo M that presents to the ED with CP and SOB found to have LLL filling defects that may reflect a small PE. Pharmacy has been asked to dose heparin.   Heparin level is therapeutic at 0.39 on 1400 units/hr. No bleeding noted, CBC stable.   Goal of Therapy:  Heparin level 0.3-0.7 units/ml Monitor platelets by anticoagulation protocol: Yes   Plan:  Continue heparin drip at 1400 units/hr Monitor daily HL, CBC/plt Monitor for signs/symptoms of bleeding    Thank you for involving pharmacy in this patient's care.  Benetta Spar, PharmD, BCPS, BCCP Clinical Pharmacist  Please check AMION for all [place] phone numbers After 10:00 PM, call [place] [phone]
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
## 2019-07-28 ENCOUNTER — Telehealth: Payer: Self-pay | Admitting: Pharmacist

## 2019-07-28 DIAGNOSIS — R600 Localized edema: Secondary | ICD-10-CM

## 2019-07-28 DIAGNOSIS — I2699 Other pulmonary embolism without acute cor pulmonale: Principal | ICD-10-CM

## 2019-07-28 DIAGNOSIS — E852 Heredofamilial amyloidosis, unspecified: Secondary | ICD-10-CM

## 2019-07-28 LAB — LIPID PANEL
Cholesterol: 109 mg/dL (ref 0–200)
HDL: 33 mg/dL — ABNORMAL LOW (ref >40)
LDL Cholesterol: 61 mg/dL (ref 0–99)
Total CHOL/HDL Ratio: 3.3 RATIO
Triglycerides: 74 mg/dL (ref <150)
VLDL: 15 mg/dL (ref 0–40)

## 2019-07-28 LAB — CBC
HCT: 37.8 % — ABNORMAL LOW (ref 39.0–52.0)
Hemoglobin: 12.3 g/dL — ABNORMAL LOW (ref 13.0–17.0)
MCH: 29.4 pg (ref 26.0–34.0)
MCHC: 32.5 g/dL (ref 30.0–36.0)
MCV: 90.4 fL (ref 80.0–100.0)
Platelets: 248 10*3/uL (ref 150–400)
RBC: 4.18 MIL/uL — ABNORMAL LOW (ref 4.22–5.81)
RDW: 16.7 % — ABNORMAL HIGH (ref 11.5–15.5)
WBC: 10.5 10*3/uL (ref 4.0–10.5)
nRBC: 0 % (ref 0.0–0.2)

## 2019-07-28 LAB — BASIC METABOLIC PANEL
Anion gap: 9 (ref 5–15)
Anion gap: 7 (ref 5–15)
BUN: 28 mg/dL — ABNORMAL HIGH (ref 8–23)
BUN: 26 mg/dL — ABNORMAL HIGH (ref 8–23)
CO2: 19 mmol/L — ABNORMAL LOW (ref 22–32)
CO2: 22 mmol/L (ref 22–32)
Calcium: 8.7 mg/dL — ABNORMAL LOW (ref 8.9–10.3)
Calcium: 8.8 mg/dL — ABNORMAL LOW (ref 8.9–10.3)
Chloride: 111 mmol/L (ref 98–111)
Chloride: 110 mmol/L (ref 98–111)
Creatinine, Ser: 1.33 mg/dL — ABNORMAL HIGH (ref 0.61–1.24)
Creatinine, Ser: 1.28 mg/dL — ABNORMAL HIGH (ref 0.61–1.24)
GFR calc Af Amer: >60 mL/min (ref >60)
GFR calc Af Amer: >60 mL/min (ref >60)
GFR calc non Af Amer: 57 mL/min — ABNORMAL LOW (ref >60)
GFR calc non Af Amer: >60 mL/min (ref >60)
Glucose, Bld: 122 mg/dL — ABNORMAL HIGH (ref 70–99)
Glucose, Bld: 132 mg/dL — ABNORMAL HIGH (ref 70–99)
Potassium: 3.8 mmol/L (ref 3.5–5.1)
Potassium: 4.1 mmol/L (ref 3.5–5.1)
Sodium: 139 mmol/L (ref 135–145)
Sodium: 139 mmol/L (ref 135–145)

## 2019-07-28 LAB — HEPARIN LEVEL (UNFRACTIONATED): Heparin Unfractionated: 0.3 IU/mL (ref 0.30–0.70)

## 2019-07-28 MED ORDER — APIXABAN 5 MG PO TABS: 5.0000 mg | ORAL_TABLET | Freq: Two times a day (BID) | ORAL | Status: DC

## 2019-07-28 MED ORDER — APIXABAN 5 MG PO TABS: 10.0000 mg | ORAL_TABLET | Freq: Two times a day (BID) | ORAL | Status: DC

## 2019-07-28 MED ORDER — APIXABAN 5 MG PO TABS
10.0000 mg | ORAL_TABLET | Freq: Two times a day (BID) | ORAL | Status: DC
  Administered 2019-07-28 – 2019-07-29 (×2): 10 mg via ORAL
  Filled 2019-07-28 (×3): qty 2

## 2019-07-28 MED ORDER — HEPARIN (PORCINE) 25000 UT/250ML-% IV SOLN
1450.0000 [IU]/h | INTRAVENOUS | Status: AC
  Administered 2019-07-28: 1450 [IU]/h via INTRAVENOUS

## 2019-07-28 NOTE — Progress Notes (Signed)
[place] for Heparin Indication: 07/27/19 Small PE   Allergies  Allergen Reactions  . Varenicline Other (See Comments)    Patient reports laryngospasm stopped in the ER    Patient Measurements: Height: 5\' 9"  (175.3 cm) Weight: (!) 145.8 kg (321 lb 6.9 oz) IBW/kg (Calculated) : 70.7 Heparin Dosing Weight: 105 kg  Vital Signs: Temp: 97.9 F (36.6 C) (06/05 0439) Temp Source: Oral (06/05 0439) BP: 129/65 (06/05 0439) Pulse Rate: 66 (06/05 0439)  Labs: Recent Labs    07/26/19 2333 07/27/19 0610 07/27/19 0706 07/27/19 1150 07/27/19 1943 07/28/19 0633  HGB  --   --   --  11.9*  --  12.3*  HCT  --   --   --  36.1*  --  37.8*  PLT  --   --   --  240  --  248  APTT 21*  --   --   --   --   --   LABPROT 12.6  --   --   --   --   --   INR 1.0  --   --   --   --   --   HEPARINUNFRC  --    < >  --  0.23* 0.39 0.30  CREATININE 1.81*  --   --  1.61*  --  1.33*  TROPONINIHS 16  --  14  --   --   --    < > = values in this interval not displayed.    Estimated Creatinine Clearance: 83.1 mL/min (A) (by C-G formula based on SCr of 1.33 mg/dL (H)).  Assessment: Patient is 59 yo M that presents to the ED with CP and SOB found to have LLL filling defects that may reflect a small PE. Pharmacy has been asked to dose heparin.   Heparin level is therapeutic at 0.3 on 1400 units/hr. Hemoglobin low, but trending up. Platelet count WNL. Spoke to the RN who states infusion has not been turned off and there has been no significant s/sx of bleeding. Will increase rate slightly to 1450 units/hr since level is borderline therapeutic and recheck tomorrow morning.   Goal of Therapy:  Heparin level 0.3-0.7 units/ml Monitor platelets by anticoagulation protocol: Yes   Plan:  Increase heparin drip to 1450 units/hr Monitor daily HL, CBC Monitor for signs/symptoms of bleeding    Thank you for involving pharmacy in this patient's care.  Agnes Lawrence,  PharmD PGY1 Pharmacy Resident  Please check AMION for all [place] phone numbers After 10:00 PM, call [place] [phone]

## 2019-07-28 NOTE — Telephone Encounter (Signed)
Attempted contact RE tobacco cessation.   No answer and full mailbox.  - Realized patient was hospitalized.  Plan follow-up by phone  after discharge.

## 2019-07-28 NOTE — Discharge Instructions (Addendum)
Russell Collier, thank you for allowing [place] to care for you while you were in the hospital.  It will be very important for you to take your blood thinner as prescribed, which is explained below.  Please call your doctor if you have any questions.  We are stopping your aspirin but continuing your Plavix.  We are also continuing your other home medications.  We are starting a medication called Incruse Ellipta to help with your breathing.  You have been taking it in the hospital and we would like for you to continue to take it.  I will be very important for you to follow-up with your PCP for further work-up of your possible blood clot.  Information on my medicine - ELIQUIS (apixaban)  Why was Eliquis prescribed for you? Eliquis was prescribed to treat blood clots that may have been found in the veins of your legs (deep vein thrombosis) or in your lungs (pulmonary embolism) and to reduce the risk of them occurring again.  What do You need to know about Eliquis ? The starting dose is 10 mg (two 5 mg tablets) taken TWICE daily for the FIRST SEVEN (7) DAYS, then the dose is reduced to ONE 5 mg tablet taken TWICE daily.  Eliquis may be taken with or without food.   Try to take the dose about the same time in the morning and in the evening. If you have difficulty swallowing the tablet whole please discuss with your pharmacist how to take the medication safely.  Take Eliquis exactly as prescribed and DO NOT stop taking Eliquis without talking to the doctor who prescribed the medication.  Stopping may increase your risk of developing a new blood clot.  Refill your prescription before you run out.  After discharge, you should have regular check-up appointments with your healthcare provider that is prescribing your Eliquis.    What do you do if you miss a dose? If a dose of ELIQUIS is not taken at the scheduled time, take it as soon as possible on the same day and twice-daily administration should be resumed.  The dose should not be doubled to make up for a missed dose.  Important Safety Information A possible side effect of Eliquis is bleeding. You should call your healthcare provider right away if you experience any of the following: ? Bleeding from an injury or your nose that does not stop. ? Unusual colored urine (red or dark brown) or unusual colored stools (red or black). ? Unusual bruising for unknown reasons. ? A serious fall or if you hit your head (even if there is no bleeding).  Some medicines may interact with Eliquis and might increase your risk of bleeding or clotting while on Eliquis. To help avoid this, consult your healthcare provider or pharmacist prior to using any new prescription or non-prescription medications, including herbals, vitamins, non-steroidal anti-inflammatory drugs (NSAIDs) and supplements.  This website has more information on Eliquis (apixaban): http://www.eliquis.com/eliquis/home

## 2019-07-28 NOTE — Progress Notes (Signed)
Family Medicine Teaching Service Daily Progress Note Intern Pager: [phone]  Patient name: Russell Collier Medical record number: [id number] Date of birth: 10/20/1960 Age: 59 y.o. Gender: male  Primary Care Provider: [name], MD Consultants: Russell Collier: Full  Pt Overview and Major Events to Date:  6/4-admitted  Assessment and Plan: Russell Collier is a 59 y.o. male presenting with atypical chest pain. PMH is significant for hx of atypical chest pain, unstable angina, GERD, HFpEF, cardiac amyloidosis and hx of PE.   Atypical Chest pain  ?Small PE on CTA Patient with hx of atypical chest pain, unstable angina, GERD, HFpEF, cardiac amyloidosis and hx of PE. Patient with diaphoresis, SOB, and sudden onset of sharp left sided chest pain that lasted 20 minutes and resolved with nitroglycerine. Active member to cardiac rehab. Troponins negative. Differential includes worsening of Cardiac amyloidosis, vasospasm, cocaine use (patient has history of such).  Atypical chest pain could also be due to questionable small PE on CTA. - Consult cardiology, appreciate recs  - AM CBC, BMP  - f/u EKG, UDS  - Continue heparin gtt -will switch to Eliquis this evening 6/5 at 8PM, stop aspirin, continue Plavix. - Cardiopulmonary monitoring  - nitroglycerine PRN   Dyspnea  H/o COPD  ?Small PE, improved, stable Patient with history of PE (2014). CTA 6/4 with "left lower lobe segmental branch filling defects which may reflect small PE versus artifact".  Lower extremity Dopplers negative for DVT, however limited exam. Also with COPD exacerbation given diffuse wheezing and decreased breath sounds on exam. Has cough but no increased sputum production.  PFTs (09/2018) suggestive of moderate obstructive lung disease.  Echo with normal EF. Symptoms not consistent with CHF exacerbation. BNP wnl however patient is Obese.  - duo nebs q 4 hours - continue heparin dtt - will switch to Eliquis this  evening 6/5 at 8PM, stop aspirin, continue Plavix. - monitor respiratory Collier - Oxygen therapy PRN  Acute kidney injury, improved Cr 1.81 on admission, 1.3 today. Baseline ~1.1. Will hold nephrotoxic drugs.Patient likely dehydrated secondary to diffuse diaphoresis reported on admission. Patient s/p 1 L NS bolus. - Hold home ACEi  - Avoid nephrotoxic agents - daily BMPs   Hypertension, stable BP this AM 129/65. Home meds include enalapril, furosemide, imdur daily.   - Continue Lasix 40 mg twice daily, spironolactone 12.5 mg daily; hold home enalapril, Imdur. - Monitor blood pressures  Great Toe Infection, improving, stable Patient s/p great toenail removal.  Reports starting Keflex for toenail infection after his procedure.  Per chart review Patient started on Keflex 07/19/19 to complete 10 day course.  - Continue Keflex, abx therapy until 6/6  HFpEF Patient with chronic diastolic Russell failure. Patient with +1 pitting edema but no rales on exam on admission. CXR was grossly unremarkable. Last ECHO 02/03/2018 with EF 55-60% with G2DD.  - Strict Is/Os  - Daily weights  - Monitor cardiopulmonary Collier   Cardiac Amyloidosis  Patient being worked up by Russell Collier.  Patient taking Tafamidis. Patient with Familial TTR Cardiac Amyloidosis: PYP scan 09/2018 was equivocal. Has TTR gene, heterozygous.  - Consult Cardiology  - Continue home medication    T2DM, stable  Blood sugar 100-120. Home meds include Farxiga. He was prescribed Metformin in the past and he felt terrible while taking and has refused metformin since. Last A1c was 7.0 on 04/16/19.   - Monitor with BMPs - Consider adding SSI  - Hold Farxiga given AKI  Hyperlipidemia  Cornary Artery Disease  Patient home medication is Atorvastatin, ASA, Plavix. Lipid panel on admission total chol 106, TG 158, HDL 27, LDL 47.  Last Russell cath 12/20/18 showed 80% mLCX lesion and DES placed.  Follows with Russell Collier.  - Continue  atorvastatin and Plavix -Stopping aspirin  Chronic knee pain  Home medications include Mobic and Baclofen.  - Continue home Baclofen  - Hold Mobic in setting of AKI   - Tylenol   Morbid obesity Body mass index is 46.88 kg/m.  Continue to encourage healthy eating habits and regular exercise as tolerated .    OSA  Patient noncompliant with CPAP and reports he needs a new mask.  - CPAP qhs   Tobacco use disorder Patient endorses smoking 1/2 pack/day. He has history of 2-3 PPD x 10-15 years. Occasional Marijuana, but no other illicit drugs. Has been smoking for the past ~50 years. Patient did request nicotine patch while in the ED. -Encourage smoking cessation -Nicotine patch   FEN/GI: Russell healthy PPx: Heparin drip  Disposition: Plan to discharge home once stable, possibly tomorrow 6/6  Subjective:  Patient seen sitting upright in chair at bedside, reports he has no issues with his breathing at this time, however reports some neuropathic complaints, otherwise no concerns  Objective: Temp:  [97.6 F (36.4 C)-98.5 F (36.9 C)] 97.9 F (36.6 C) (06/05 0439) Pulse Rate:  [60-86] 66 (06/05 0439) Resp:  [16-18] 16 (06/05 0439) BP: (121-136)/(54-78) 129/65 (06/05 0439) SpO2:  [94 %-100 %] 97 % (06/05 0752) Weight:  [144.5 kg-145.8 kg] 145.8 kg (06/05 0439) Physical Exam: General: Well-appearing, nontoxic Cardiovascular: RRR, S1-S2 present, no murmurs appreciated Respiratory: CTA bilaterally, comfortable work of breathing on room air Abdomen: Soft, nontender, normal bowel sounds appreciated Extremities: 1+ pitting edema to bilateral lower extremities  Laboratory: Recent Labs  Lab 07/27/19 1150 07/28/19 0633  WBC 9.8 10.5  HGB 11.9* 12.3*  HCT 36.1* 37.8*  PLT 240 248   Recent Labs  Lab 07/26/19 2333 07/27/19 1150 07/28/19 0633  NA 138 140 139  K 3.5 3.9 3.8  CL 108 109 111  CO2 19* 22 19*  BUN 39* 36* 28*  CREATININE 1.81* 1.61* 1.33*  CALCIUM 8.9 8.6*  8.7*  PROT 6.5  --   --   BILITOT 0.2*  --   --   ALKPHOS 66  --   --   ALT 29  --   --   AST 23  --   --   GLUCOSE 109* 112* 122*   Lipid panel: HDL 33, otherwise normal. Heparin level: 0.3, lower limit of normal  Imaging/Diagnostic Tests: CTA chest: Left lower lobe segmental branch filling defects may reflect small pulmonary emboli versus artifact. No acute airspace disease. Additional chronic/incidental findings as above.  CXR 6/3: Normal  ECHO 6/4: 1. Abnormal septal motion. LVEF = 55% with otherwise normal function, no regional wall abnormality, mild LVH.  Right ventricular systolic function and size are normal. The mitral valve is normal in structure. Trivial mitral valve regurgitation. No evidence of mitral stenosis. The aortic valve is tricuspid. Aortic valve regurgitation is not visualized. Mild aortic valve sclerosis is present, with no evidence of aortic valve stenosis.  IVC is dilated in size with <50% respiratory variability, suggesting right atrial pressure of 15 mmHg.   VAS [place] LE: No evidence of deep vein thrombosis in the lower extremity. However, portions of this examination were limited  Daisy Floro, DO 07/28/2019, 9:48 AM PGY-2, [place] Intern pager: [phone],  text pages welcome

## 2019-07-29 DIAGNOSIS — R600 Localized edema: Secondary | ICD-10-CM

## 2019-07-29 DIAGNOSIS — J9600 Acute respiratory failure, unspecified whether with hypoxia or hypercapnia: Secondary | ICD-10-CM

## 2019-07-29 LAB — CBC
HCT: 34.4 % — ABNORMAL LOW (ref 39.0–52.0)
Hemoglobin: 11.3 g/dL — ABNORMAL LOW (ref 13.0–17.0)
MCH: 29.5 pg (ref 26.0–34.0)
MCHC: 32.8 g/dL (ref 30.0–36.0)
MCV: 89.8 fL (ref 80.0–100.0)
Platelets: 228 K/uL (ref 150–400)
RBC: 3.83 MIL/uL — ABNORMAL LOW (ref 4.22–5.81)
RDW: 16.7 % — ABNORMAL HIGH (ref 11.5–15.5)
WBC: 9.5 K/uL (ref 4.0–10.5)
nRBC: 0 % (ref 0.0–0.2)

## 2019-07-29 MED ORDER — APIXABAN (ELIQUIS) VTE STARTER PACK (10MG AND 5MG): ORAL_TABLET | ORAL | 0 refills | Status: DC

## 2019-07-29 MED ORDER — UMECLIDINIUM BROMIDE 62.5 MCG/INH IN AEPB: 1.0000 | INHALATION_SPRAY | Freq: Every day | RESPIRATORY_TRACT | 0 refills | Status: DC

## 2019-07-29 NOTE — TOC Benefit Eligibility Note (Signed)
Transition of Care [facility]) Benefit Eligibility Note    Patient Details  Name: Russell Collier MRN: [id number] Date of Birth: June 19, 1960   Jennye Moccasin and Eliquis are both covered by [facility]                               Carles Collet, RN Phone Number: 07/29/2019, 8:21 AM

## 2019-07-29 NOTE — Progress Notes (Signed)
Family Medicine Teaching Service Daily Progress Note Intern Pager: [phone]  Patient name: Russell Collier Medical record number: [id number] Date of birth: Mar 11, 1960 Age: 59 y.o. Gender: male  Primary Care Provider: [name], MD Consultants: Heart failure Code Status: Full  Pt Overview and Major Events to Date:  6/4-admitted 6/5-transition to Eliquis  Assessment and Plan: Shimshon Narula is a 59 y.o. male presenting with atypical chest pain. PMH is significant for hx of atypical chest pain, unstable angina, GERD, HFpEF, cardiac amyloidosis and hx of PE.   Atypical Chest pain   ?Small PE on CTA Uncertain etiology, but possible segmental PE was found on CTA, and patient does have a history of PE.  ACS work-up has been negative.  COPD could also be contributing.  Cardiology has assessed and thought ACS was also unlikely, would like to continue his home diuresis.  Patient was switched from heparin GTT to apixaban on 6/5, will discharge home with Dosepak, likely today. -Continue Eliquis, furosemide, spironolactone, Incruse Ellipta, Tafamidis -Follow-up outpatient for cancer screening  Acute kidney injury, improved Cr 1.81 on admission, 1.28 in the evening of 6/5. Baseline ~1.1. Will hold nephrotoxic drugs.Patient likely dehydrated secondary to diffuse diaphoresis reported on admission.  -Resume home medications on discharge  Hypertension, stable BP this AM 122/72. Home meds include enalapril, furosemide, imdur daily.   -Continue home medications on discharge. - Monitor blood pressures  Great Toe Infection, improving, stable Patient s/p great toenail removal.  Reports starting Keflex for toenail infection after his procedure.  Per chart review Patient started on Keflex 07/19/19 to complete 10 day course.  - Continue Keflex, abx therapy until 6/6  HFpEF Patient with chronic diastolic heart failure.  CXR was grossly unremarkable. Last ECHO 02/03/2018 with EF 55-60% with G2DD.  -  Strict Is/Os  - Daily weights  - Monitor cardiopulmonary status   Cardiac Amyloidosis  Patient being worked up by Dr. Haroldine Laws.  Patient taking Tafamidis. Patient with Familial TTR Cardiac Amyloidosis: PYP scan 09/2018 was equivocal. Has TTR gene, heterozygous.  - Consult Cardiology  - Continue home medication    T2DM, stable  Blood sugar 100-120. Home meds include Farxiga. He was prescribed Metformin in the past and he felt terrible while taking and has refused metformin since. Last A1c was 7.0 on 04/16/19.   - Monitor with BMPs -Restart Farxiga on discharge  Hyperlipidemia   Cornary Artery Disease  Patient home medication is Atorvastatin, ASA, Plavix. Lipid panel on admission total chol 106, TG 158, HDL 27, LDL 47.  Last heart cath 12/20/18 showed 80% mLCX lesion and DES placed.  Follows with Dr. Haroldine Laws.  - Continue atorvastatin and Plavix -Stopping aspirin  Chronic knee pain  Home medications include Mobic and Baclofen.  - Continue home Baclofen  - Tylenol   Morbid obesity Body mass index is 46.88 kg/m.  Continue to encourage healthy eating habits and regular exercise as tolerated .    OSA  Patient noncompliant with CPAP and reports he needs a new mask.  - CPAP qhs   Tobacco use disorder Patient endorses smoking 1/2 pack/day. He has history of 2-3 PPD x 10-15 years. Occasional Marijuana, but no other illicit drugs. Has been smoking for the past ~50 years. Patient did request nicotine patch while in the ED. -Encourage smoking cessation -Nicotine patch   FEN/GI: Heart healthy PPx: Heparin drip  Disposition: Home likely 6/6  Subjective:  Patient denies chest pain or shortness of breath today.  He would like to go home if possible.  He has no concerns about taking Eliquis.  Objective: Temp:  [97.7 F (36.5 C)-98.4 F (36.9 C)] 98.4 F (36.9 C) (06/06 0455) Pulse Rate:  [61-68] 67 (06/06 0455) Resp:  [16-18] 16 (06/06 0455) BP: (122-146)/(65-72) 122/72  (06/06 0455) SpO2:  [97 %-100 %] 97 % (06/06 0455) Weight:  [140 kg] 140 kg (06/06 0455) Physical Exam: General: Pleasant, morbidly obese gentleman resting in bed, well-appearing Cardiovascular: RRR, no MRG Respiratory: Mild wheezing at the bases, otherwise clear Abdomen: Soft, nontender, normal bowel sounds Extremities: 1+ pitting edema on right lower extremity, no edema on left lower extremity  Laboratory: Recent Labs  Lab 07/27/19 1150 07/28/19 0633 07/29/19 0443  WBC 9.8 10.5 9.5  HGB 11.9* 12.3* 11.3*  HCT 36.1* 37.8* 34.4*  PLT 240 248 228   Recent Labs  Lab 07/26/19 2333 07/26/19 2333 07/27/19 1150 07/28/19 0633 07/28/19 1658  NA 138   < > 140 139 139  K 3.5   < > 3.9 3.8 4.1  CL 108   < > 109 111 110  CO2 19*   < > 22 19* 22  BUN 39*   < > 36* 28* 26*  CREATININE 1.81*   < > 1.61* 1.33* 1.28*  CALCIUM 8.9   < > 8.6* 8.7* 8.8*  PROT 6.5  --   --   --   --   BILITOT 0.2*  --   --   --   --   ALKPHOS 66  --   --   --   --   ALT 29  --   --   --   --   AST 23  --   --   --   --   GLUCOSE 109*   < > 112* 122* 132*   < > = values in this interval not displayed.   Lipid panel: HDL 33, otherwise normal. Heparin level: 0.3, lower limit of normal  Imaging/Diagnostic Tests: CTA chest: Left lower lobe segmental branch filling defects may reflect small pulmonary emboli versus artifact. No acute airspace disease. Additional chronic/incidental findings as above.  CXR 6/3: Normal  ECHO 6/4: 1. Abnormal septal motion. LVEF = 55% with otherwise normal function, no regional wall abnormality, mild LVH.  Right ventricular systolic function and size are normal. The mitral valve is normal in structure. Trivial mitral valve regurgitation. No evidence of mitral stenosis. The aortic valve is tricuspid. Aortic valve regurgitation is not visualized. Mild aortic valve sclerosis is present, with no evidence of aortic valve stenosis.  IVC is dilated in size with <50% respiratory  variability, suggesting right atrial pressure of 15 mmHg.   VAS [place] LE: No evidence of deep vein thrombosis in the lower extremity. However, portions of this examination were limited  Kathrene Alu, MD 07/29/2019, 5:47 AM PGY-3, [place] Intern pager: [phone], text pages welcome

## 2019-07-29 NOTE — Discharge Summary (Signed)
[facility] Discharge Summary  Patient name: Russell Collier Medical record number: [id number] Date of birth: 1961/01/18 Age: 59 y.o. Gender: male Date of Admission: 07/26/2019  Date of Discharge: 07/29/19 Admitting Physician: Lyndee Hensen, DO  Primary Care Provider: [name], MD Consultants: Cardiology  Indication for Hospitalization: Atypical chest pain, segmental PE visualized on CTA  Discharge Diagnoses/Problem List:  History of PE Hypertension Great toe infection Heart failure with preserved ejection fraction Cardiac amyloidosis Type 2 diabetes Hyperlipidemia, CAD Chronic knee pain Morbid obesity OSA Tobacco use disorder  Disposition: Home  Discharge Condition: Stable, improved  Discharge Exam:  General: Pleasant, morbidly obese gentleman resting in bed, well-appearing Cardiovascular: RRR, no MRG Respiratory: Mild wheezing at the bases, otherwise clear Abdomen: Soft, nontender, normal bowel sounds Extremities: 1+ pitting edema on right lower extremity, no edema on left lower extremity   Brief Hospital Course:  Patient presented on 6/4 with atypical chest pain as well as wheezing.  Troponins were low and flat, EKG reassuring.  He was started on IV heparin due to concern for ACS.  Cardiology was consulted, and they did not think that ACS was likely.  Patient was also started on Incruse Ellipta since his COPD was thought to be contributing.  A CTA was performed, which showed a possible segmental PE.  Since he does have a history of PE, he was continued on IV heparin and transitioned to Eliquis on 6/5.  He tolerated this well and had no shortness of breath or chest pain on the morning of 6/6.  He was discharged on 6/6 with an Eliquis Dosepak and instructions to follow-up with his PCP.  His home medications were continued except for aspirin and NSAIDs due to his AK, which improved throughout admission.  Issues for Follow Up:  1. Obtain a BMP  on follow-up to ensure that AKI has resolved. 2. Ensure that patient receives all appropriate cancer screenings. 3. Review recommended length of time for treatment of patient's PE considering this is patient's second PE. 4. Consider repeat CT outpatient to determine if this was actually a PE versus artifact   Significant Procedures: none  Significant Labs and Imaging:  Recent Labs  Lab 07/27/19 1150 07/28/19 0633 07/29/19 0443  WBC 9.8 10.5 9.5  HGB 11.9* 12.3* 11.3*  HCT 36.1* 37.8* 34.4*  PLT 240 248 228   Recent Labs  Lab 07/26/19 2333 07/26/19 2333 07/27/19 1150 07/27/19 1150 07/28/19 0633 07/28/19 1658  NA 138  --  140  --  139 139  K 3.5   < > 3.9   < > 3.8 4.1  CL 108  --  109  --  111 110  CO2 19*  --  22  --  19* 22  GLUCOSE 109*  --  112*  --  122* 132*  BUN 39*  --  36*  --  28* 26*  CREATININE 1.81*  --  1.61*  --  1.33* 1.28*  CALCIUM 8.9  --  8.6*  --  8.7* 8.8*  ALKPHOS 66  --   --   --   --   --   AST 23  --   --   --   --   --   ALT 29  --   --   --   --   --   ALBUMIN 3.6  --   --   --   --   --    < > = values in this interval not  displayed.    Results/Tests Pending at Time of Discharge: none  Discharge Medications:  Allergies as of 07/29/2019      Reactions   Varenicline Other (See Comments)   Patient reports laryngospasm stopped in the ER      Medication List    STOP taking these medications   aspirin 81 MG chewable tablet   cephALEXin 500 MG capsule Commonly known as: KEFLEX   ibuprofen 200 MG tablet Commonly known as: ADVIL   meloxicam 7.5 MG tablet Commonly known as: MOBIC   nicotine polacrilex 4 MG lozenge Commonly known as: SM Nicotine Polacrilex   predniSONE 50 MG tablet Commonly known as: DELTASONE     TAKE these medications   albuterol (2.5 MG/3ML) 0.083% nebulizer solution Commonly known as: PROVENTIL Take 2.5 mg by nebulization every 6 (six) hours as needed for wheezing or shortness of breath.   albuterol 108  (90 Base) MCG/ACT inhaler Commonly known as: VENTOLIN HFA Inhale 2 puffs into the lungs every 6 (six) hours as needed for wheezing or shortness of breath.   Apixaban Starter Pack (10mg  and 5mg ) Commonly known as: ELIQUIS STARTER PACK Take as directed on package: start with two-5mg  tablets twice daily for 7 days. On day 8, switch to one-5mg  tablet twice daily.   atorvastatin 80 MG tablet Commonly known as: Lipitor Take 1 tablet (80 mg total) by mouth daily.   baclofen 10 MG tablet Commonly known as: LIORESAL Take 0.5-1 tablets (5-10 mg total) by mouth 3 (three) times daily as needed for muscle spasms.   clopidogrel 75 MG tablet Commonly known as: PLAVIX Take 1 tablet (75 mg total) by mouth daily with breakfast.   enalapril 20 MG tablet Commonly known as: VASOTEC Take 20 mg by mouth 2 (two) times daily.   Farxiga 10 MG Tabs tablet Generic drug: dapagliflozin propanediol Take 10 mg by mouth daily before breakfast.   fluticasone 50 MCG/ACT nasal spray Commonly known as: FLONASE Place 1 spray into both nostrils daily.   furosemide 40 MG tablet Commonly known as: LASIX Take 1 tablet (40 mg total) by mouth 2 (two) times daily.   isosorbide mononitrate 30 MG 24 hr tablet Commonly known as: IMDUR Take 1 tablet (30 mg total) by mouth daily.   multivitamin with minerals Tabs tablet Take 1 tablet by mouth daily.   pantoprazole 40 MG tablet Commonly known as: PROTONIX Take 1 tablet (40 mg total) by mouth daily.   potassium chloride SA 20 MEQ tablet Commonly known as: KLOR-CON TAKE 1 TABLET(20 MEQ) BY MOUTH DAILY What changed:   how much to take  how to take this  when to take this  additional instructions   pregabalin 100 MG capsule Commonly known as: Lyrica Take 1 tablet three times daily   spironolactone 25 MG tablet Commonly known as: ALDACTONE Take 0.5 tablets (12.5 mg total) by mouth daily.   umeclidinium bromide 62.5 MCG/INH Aepb Commonly known as:  INCRUSE ELLIPTA Inhale 1 puff into the lungs daily.   valACYclovir 500 MG tablet Commonly known as: Valtrex Take 1 tablet (500 mg total) by mouth daily.   Vyndamax 61 MG Caps Generic drug: Tafamidis Take 61 mg by mouth daily.       Discharge Instructions: Please refer to Patient Instructions section of EMR for full details.  Patient was counseled important signs and symptoms that should prompt return to medical care, changes in medications, dietary instructions, activity restrictions, and follow up appointments.   Follow-Up Appointments: Follow-up Information  Bonnita Hollow, MD Follow up on 08/02/2019.   Specialty: Family Medicine Why: Your appointment is at 10:10.  Please arrive 15 minutes prior to your appointment time. Contact information: [address] [place] [address] [phone]           [name], MD 07/29/2019, 11:36 AM PGY-1, [place]

## 2019-07-29 NOTE — Care Management (Signed)
Patient provided with cab voucher and eliquis 30 day free coupon

## 2019-07-30 ENCOUNTER — Other Ambulatory Visit (HOSPITAL_COMMUNITY): Payer: Self-pay

## 2019-07-30 NOTE — Progress Notes (Signed)
Paramedicine Encounter    Patient ID: Russell Collier, male    DOB: May 14, 1960, 59 y.o.   MRN: [id number]   Patient Care Team: [name], MD as PCP - General (Family Medicine) [name], MD as PCP - Cardiology (Cardiology) Michael Boston, MD as Consulting Physician (General Surgery) Wilford Corner, MD as Consulting Physician (Gastroenterology) Jorge Ny, LCSW as Social Worker (Licensed Clinical Social Worker) Alda Berthold, DO as Consulting Physician (Neurology)  Patient Active Problem List   Diagnosis Date Noted  . Lower extremity edema   . 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 Acute respiratory failure ([place]) 07/27/2019  . Breast tenderness in male 05/31/2019  . Chronic pain 05/31/2019  . AKI (acute kidney injury) ([place]) 05/31/2019  . Pain due to onychomycosis of toenails of both feet 05/16/2019  . Chronic knee pain 04/16/2019  . Diabetes ([place]) 04/16/2019  . Esophageal reflux 02/06/2019  . Heartburn 02/06/2019  . Chronic skin ulcer of lower leg ([place]) 12/21/2018  . S/P drug eluting coronary stent placement   . Coronary artery disease involving native coronary artery of native heart with unstable angina pectoris ([place]) 12/20/2018  . Unstable angina ([place])   . 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 Acute on chronic diastolic heart failure ([place])   . Shortness of breath   . Precordial chest pain   . Idiopathic peripheral neuropathy   . Palpitations   . Chronic diastolic heart failure ([place])   . Abnormal ankle brachial index (ABI)   . Chest pain, rule out acute myocardial infarction 09/26/2018  . Dyspepsia   . Morbid obesity ([place])   . OSA on CPAP   . Chest pain 05/30/2018  . CAD (coronary artery disease) 03/30/2018  . Elevated troponin 02/03/2018  . Positive urine drug screen 02/03/2018  . Tobacco abuse 02/03/2018  . Hyperlipidemia 02/03/2018  . Acute respiratory failure with hypoxia ([place]) 02/02/2018  . Acute congestive heart failure ([place])   . Atypical chest pain  12/27/2015  . Essential hypertension   . Neuropathy     Current Outpatient Medications:  .  albuterol (PROVENTIL) (2.5 MG/3ML) 0.083% nebulizer solution, Take 2.5 mg by nebulization every 6 (six) hours as needed for wheezing or shortness of breath., Disp: , Rfl:  .  albuterol (VENTOLIN HFA) 108 (90 Base) MCG/ACT inhaler, Inhale 2 puffs into the lungs every 6 (six) hours as needed for wheezing or shortness of breath., Disp: , Rfl:  .  atorvastatin (LIPITOR) 80 MG tablet, Take 1 tablet (80 mg total) by mouth daily., Disp: 90 tablet, Rfl: 3 .  clopidogrel (PLAVIX) 75 MG tablet, Take 1 tablet (75 mg total) by mouth daily with breakfast., Disp: 90 tablet, Rfl: 3 .  dapagliflozin propanediol (FARXIGA) 10 MG TABS tablet, Take 10 mg by mouth daily before breakfast., Disp: 30 tablet, Rfl: 6 .  enalapril (VASOTEC) 20 MG tablet, Take 20 mg by mouth 2 (two) times daily., Disp: , Rfl:  .  fluticasone (FLONASE) 50 MCG/ACT nasal spray, Place 1 spray into both nostrils daily., Disp: 18.2 mL, Rfl: 2 .  furosemide (LASIX) 40 MG tablet, Take 1 tablet (40 mg total) by mouth 2 (two) times daily., Disp: 180 tablet, Rfl: 3 .  isosorbide mononitrate (IMDUR) 30 MG 24 hr tablet, Take 1 tablet (30 mg total) by mouth daily., Disp: 90 tablet, Rfl: 3 .  Multiple Vitamin (MULTIVITAMIN WITH MINERALS) TABS tablet, Take 1 tablet by mouth daily., Disp: , Rfl:  .  pantoprazole (PROTONIX) 40 MG tablet, Take 1 tablet (40  mg total) by mouth daily., Disp: 90 tablet, Rfl: 0 .  potassium chloride SA (KLOR-CON) 20 MEQ tablet, TAKE 1 TABLET(20 MEQ) BY MOUTH DAILY (Patient taking differently: Take 20 mEq by mouth daily. ), Disp: 90 tablet, Rfl: 3 .  pregabalin (LYRICA) 100 MG capsule, Take 1 tablet three times daily, Disp: 270 capsule, Rfl: 3 .  Tafamidis (VYNDAMAX) 61 MG CAPS, Take 61 mg by mouth daily., Disp: , Rfl:  .  umeclidinium bromide (INCRUSE ELLIPTA) 62.5 MCG/INH AEPB, Inhale 1 puff into the lungs daily., Disp: 7 each, Rfl: 0 .   valACYclovir (VALTREX) 500 MG tablet, Take 1 tablet (500 mg total) by mouth daily., Disp: 90 tablet, Rfl: 3 .  APIXABAN (ELIQUIS) VTE STARTER PACK (10MG  AND 5MG ), Take as directed on package: start with two-5mg  tablets twice daily for 7 days. On day 8, switch to one-5mg  tablet twice daily., Disp: 1 each, Rfl: 0 .  baclofen (LIORESAL) 10 MG tablet, Take 0.5-1 tablets (5-10 mg total) by mouth 3 (three) times daily as needed for muscle spasms., Disp: 30 each, Rfl: 3 .  spironolactone (ALDACTONE) 25 MG tablet, Take 0.5 tablets (12.5 mg total) by mouth daily., Disp: 45 tablet, Rfl: 3 Allergies  Allergen Reactions  . Varenicline Other (See Comments)    Patient reports laryngospasm stopped in the ER     Social History   Socioeconomic History  . Marital status: Divorced    Spouse name: Not on file  . Number of children: 2  . Years of education: 10  . Highest education level: Not on file  Occupational History  . Occupation: not employed  Tobacco Use  . Smoking status: Current Every Day Smoker    Packs/day: 0.50    Years: 54.00    Pack years: 27.00    Types: Cigarettes, Cigars  . Smokeless tobacco: Never Used  Substance and Sexual Activity  . Alcohol use: Yes    Alcohol/week: 1.0 standard drinks    Types: 1 Cans of beer per week  . Drug use: Yes    Comment: last use may last year  . Sexual activity: Yes  Other Topics Concern  . Not on file  Social History Narrative   Right handed   Two story home   No caffeine   Social Determinants of Health   Financial Resource Strain: Low Risk   . Difficulty of Paying Living Expenses: Not very hard  Food Insecurity: No Food Insecurity  . Worried About Charity fundraiser in the Last Year: Never true  . Ran Out of Food in the Last Year: Never true  Transportation Needs: No Transportation Needs  . Lack of Transportation (Medical): No  . Lack of Transportation (Non-Medical): No  Physical Activity: Insufficiently Active  . Days of Exercise  per Week: 2 days  . Minutes of Exercise per Session: 20 min  Stress: No Stress Concern Present  . Feeling of Stress : Not at all  Social Connections: Somewhat Isolated  . Frequency of Communication with Friends and Family: More than three times a week  . Frequency of Social Gatherings with Friends and Family: Twice a week  . Attends Religious Services: 1 to 4 times per year  . Active Member of Clubs or Organizations: No  . Attends Archivist Meetings: Never  . Marital Status: Divorced  Human resources officer Violence: Not At Risk  . Fear of Current or Ex-Partner: No  . Emotionally Abused: No  . Physically Abused: No  . Sexually Abused: No  Physical Exam Vitals reviewed.  Constitutional:      Appearance: He is normal weight.  HENT:     Head: Normocephalic.     Nose: Nose normal.     Mouth/Throat:     Mouth: Mucous membranes are moist.  Eyes:     Pupils: Pupils are equal, round, and reactive to light.  Cardiovascular:     Rate and Rhythm: Normal rate and regular rhythm.     Pulses: Normal pulses.     Heart sounds: Normal heart sounds.  Pulmonary:     Effort: Pulmonary effort is normal.     Breath sounds: Normal breath sounds.  Abdominal:     Palpations: Abdomen is soft.  Musculoskeletal:        General: Normal range of motion.     Cervical back: Normal range of motion.     Right lower leg: No edema.     Left lower leg: No edema.  Skin:    General: Skin is warm and dry.  Neurological:     Mental Status: He is alert. Mental status is at baseline.  Psychiatric:        Mood and Affect: Mood normal.    Arrived for home visit for Jozsef who was ambulating around his home without difficulty. Delyle reports he was seen in the ED Thursday night for a possible blood clot in his lung. Patient was started on eliquis but has not received same yet. Eliquis will be delivered tomorrow. Other medications reviewed and AVS confirmed. Patient was instructed by PCP and HF clinic to  DC spironolactone due to increased breast pain, and breast enlargement. Pill box filled accordingly. Vitals and assessment as noted. Takoda had no complaints of chest pain, diaphoresis or shortness of breath. Lung sounds clear. No extremity edema or lack of PMS. Home visit complete. I will see patient in one week.   Refills: Enalapril        Future Appointments  Date Time Provider [place]  08/02/2019 10:10 AM ACCESS TO CARE POOL [facility] [place]  08/03/2019  9:40 AM [name], [name], MD [facility] None  08/06/2019  1:30 PM Carney Living, PT [facility]  08/16/2019 10:00 AM [facility]  08/16/2019  2:45 PM [name], MD [facility]  08/21/2019  3:15 PM Gardiner Barefoot, DPM [facility]     ACTION: Home visit completed Next visit planned for one week

## 2019-08-02 ENCOUNTER — Emergency Department (HOSPITAL_COMMUNITY)
Admission: EM | Admit: 2019-08-02 | Discharge: 2019-08-02 | Disposition: A | Discharge status: Home / Self Care | Payer: Medicaid Other | Attending: Emergency Medicine | Admitting: Emergency Medicine

## 2019-08-02 ENCOUNTER — Inpatient Hospital Stay: Payer: Medicaid Other

## 2019-08-02 ENCOUNTER — Other Ambulatory Visit: Payer: Self-pay

## 2019-08-02 ENCOUNTER — Encounter (HOSPITAL_COMMUNITY): Payer: Self-pay | Admitting: Emergency Medicine

## 2019-08-02 ENCOUNTER — Emergency Department (HOSPITAL_COMMUNITY): Payer: Medicaid Other

## 2019-08-02 ENCOUNTER — Telehealth: Payer: Self-pay | Admitting: Pharmacist

## 2019-08-02 DIAGNOSIS — R0602 Shortness of breath: Secondary | ICD-10-CM | POA: Insufficient documentation

## 2019-08-02 DIAGNOSIS — R062 Wheezing: Secondary | ICD-10-CM

## 2019-08-02 DIAGNOSIS — Z8709 Personal history of other diseases of the respiratory system: Secondary | ICD-10-CM

## 2019-08-02 DIAGNOSIS — R072 Precordial pain: Secondary | ICD-10-CM

## 2019-08-02 DIAGNOSIS — R079 Chest pain, unspecified: Secondary | ICD-10-CM | POA: Diagnosis not present

## 2019-08-02 DIAGNOSIS — I5032 Chronic diastolic (congestive) heart failure: Secondary | ICD-10-CM | POA: Diagnosis not present

## 2019-08-02 DIAGNOSIS — Z7902 Long term (current) use of antithrombotics/antiplatelets: Secondary | ICD-10-CM | POA: Insufficient documentation

## 2019-08-02 DIAGNOSIS — J939 Pneumothorax, unspecified: Secondary | ICD-10-CM | POA: Diagnosis not present

## 2019-08-02 DIAGNOSIS — F17210 Nicotine dependence, cigarettes, uncomplicated: Secondary | ICD-10-CM | POA: Diagnosis not present

## 2019-08-02 DIAGNOSIS — E119 Type 2 diabetes mellitus without complications: Secondary | ICD-10-CM | POA: Insufficient documentation

## 2019-08-02 DIAGNOSIS — Z7901 Long term (current) use of anticoagulants: Secondary | ICD-10-CM | POA: Insufficient documentation

## 2019-08-02 DIAGNOSIS — Z955 Presence of coronary angioplasty implant and graft: Secondary | ICD-10-CM | POA: Insufficient documentation

## 2019-08-02 DIAGNOSIS — Z86711 Personal history of pulmonary embolism: Secondary | ICD-10-CM | POA: Insufficient documentation

## 2019-08-02 DIAGNOSIS — I2510 Atherosclerotic heart disease of native coronary artery without angina pectoris: Secondary | ICD-10-CM | POA: Diagnosis not present

## 2019-08-02 DIAGNOSIS — Z79899 Other long term (current) drug therapy: Secondary | ICD-10-CM | POA: Diagnosis not present

## 2019-08-02 DIAGNOSIS — J439 Emphysema, unspecified: Secondary | ICD-10-CM | POA: Diagnosis not present

## 2019-08-02 DIAGNOSIS — I110 Hypertensive heart disease with heart failure: Secondary | ICD-10-CM | POA: Diagnosis not present

## 2019-08-02 LAB — CBC
HCT: 38.3 % — ABNORMAL LOW (ref 39.0–52.0)
Hemoglobin: 12.4 g/dL — ABNORMAL LOW (ref 13.0–17.0)
MCH: 29.4 pg (ref 26.0–34.0)
MCHC: 32.4 g/dL (ref 30.0–36.0)
MCV: 90.8 fL (ref 80.0–100.0)
Platelets: 248 10*3/uL (ref 150–400)
RBC: 4.22 MIL/uL (ref 4.22–5.81)
RDW: 16.8 % — ABNORMAL HIGH (ref 11.5–15.5)
WBC: 10.8 10*3/uL — ABNORMAL HIGH (ref 4.0–10.5)
nRBC: 0 % (ref 0.0–0.2)

## 2019-08-02 LAB — BASIC METABOLIC PANEL
Anion gap: 9 (ref 5–15)
BUN: 25 mg/dL — ABNORMAL HIGH (ref 8–23)
CO2: 18 mmol/L — ABNORMAL LOW (ref 22–32)
Calcium: 9.1 mg/dL (ref 8.9–10.3)
Chloride: 111 mmol/L (ref 98–111)
Creatinine, Ser: 1.5 mg/dL — ABNORMAL HIGH (ref 0.61–1.24)
GFR calc Af Amer: 57 mL/min — ABNORMAL LOW (ref >60)
GFR calc non Af Amer: 50 mL/min — ABNORMAL LOW (ref >60)
Glucose, Bld: 128 mg/dL — ABNORMAL HIGH (ref 70–99)
Potassium: 3.9 mmol/L (ref 3.5–5.1)
Sodium: 138 mmol/L (ref 135–145)

## 2019-08-02 LAB — TROPONIN I (HIGH SENSITIVITY)
Troponin I (High Sensitivity): 19 ng/L — ABNORMAL HIGH (ref <18)
Troponin I (High Sensitivity): 17 ng/L (ref <18)

## 2019-08-02 LAB — BRAIN NATRIURETIC PEPTIDE: B Natriuretic Peptide: 31.2 pg/mL (ref 0.0–100.0)

## 2019-08-02 MED ORDER — ALBUTEROL SULFATE HFA 108 (90 BASE) MCG/ACT IN AERS
4.0000 | INHALATION_SPRAY | Freq: Once | RESPIRATORY_TRACT | Status: AC
  Administered 2019-08-02: 4 via RESPIRATORY_TRACT
  Filled 2019-08-02: qty 6.7

## 2019-08-02 MED ORDER — IPRATROPIUM BROMIDE HFA 17 MCG/ACT IN AERS
2.0000 | INHALATION_SPRAY | Freq: Once | RESPIRATORY_TRACT | Status: AC
  Administered 2019-08-02: 2 via RESPIRATORY_TRACT
  Filled 2019-08-02: qty 12.9

## 2019-08-02 MED ORDER — TRAMADOL HCL 50 MG PO TABS
50.0000 mg | ORAL_TABLET | Freq: Once | ORAL | Status: AC
  Administered 2019-08-02: 50 mg via ORAL
  Filled 2019-08-02: qty 1

## 2019-08-02 MED ORDER — IPRATROPIUM BROMIDE 0.02 % IN SOLN
0.5000 mg | Freq: Once | RESPIRATORY_TRACT | Status: DC
  Filled 2019-08-02: qty 2.5

## 2019-08-02 MED ORDER — SODIUM CHLORIDE 0.9% FLUSH: 3.0000 mL | Freq: Once | INTRAVENOUS | Status: DC

## 2019-08-02 NOTE — ED Provider Notes (Signed)
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 08/02/2019 16:47    Procedures Procedures (including critical care time)  Medications Ordered in ED Medications  sodium chloride flush (NS) 0.9 % injection 3 mL (has no administration in time range)    ED Course  I have reviewed the triage vital signs and the nursing notes.  Pertinent labs & imaging results that were available during my care of the patient were reviewed by me and considered in my medical decision making (see chart for details).    MDM Rules/Calculators/A&P                          Iv ns. Continuous pulse ox and monitor. Stat labs. Ecg. Cxr.   Reviewed nursing notes and prior charts for additional history.   Pt states he hasnt had his pain medicine today (back pain). Ultram po.   Initial labs reviewed/interpreted by me - trop sl elev. Await delta troponin.   CXR  reviewed/interpreted by me  - emphysema, no pna.   Recheck, wheezing resolved. No cp or discomfort. No sob. Breathing comfortably.   Additional labs reviewed/interpreted by me - no increase in delta trop.   Patient currently appears stable for d/c.  Rec close outpt cardiology f/u.  Return precautions provided.  Final Clinical Impression(s) / ED Diagnoses Final diagnoses:  None    Rx / DC Orders ED Discharge Orders    None       Lajean Saver, MD 08/02/19 1944

## 2019-08-02 NOTE — Telephone Encounter (Signed)
Contacted patient to assess current state and tobacco use status.   Patient reports no complaints, denies chest pain.  Reports smoking "about the same" clarified as 5-6 cigarettes per day.  He denies use of any tobacco cessation treatments (stopped patients while in hospital for angina).   No additional med treatments planned today.   I offered to call and discuss again in 2 weeks.  He accepted.  I will plan 1 additional follow-up at that time.

## 2019-08-02 NOTE — Discharge Instructions (Addendum)
It was our pleasure to provide your ER care today - we hope that you feel better.  Use albuterol treatment as need. Avoid dusty and/or smoky environments.   Follow up with your doctor/cardiologist in the coming week.  Return to ER if worse, new symptoms, increased trouble breathing, chest pain, fevers, or other concern.

## 2019-08-02 NOTE — ED Triage Notes (Signed)
Pt c/o sudden onset left sided chest pain and shortness of breath that started 80mins pta. Pt recently discharged for PE, started on blood thinner, significant cardiac hx and stent placement.

## 2019-08-02 NOTE — ED Notes (Signed)
Patient verbalizes understanding of discharge instructions. Opportunity for questioning and answers were provided. Armband removed by staff, pt discharged from ED ambulatory w/ friend  

## 2019-08-03 ENCOUNTER — Ambulatory Visit (HOSPITAL_COMMUNITY)
Admission: RE | Admit: 2019-08-03 | Discharge: 2019-08-03 | Disposition: A | Discharge status: Home / Self Care | Payer: Medicaid Other | Source: Ambulatory Visit | Attending: Internal Medicine | Admitting: Internal Medicine

## 2019-08-03 ENCOUNTER — Encounter (HOSPITAL_COMMUNITY): Payer: Self-pay | Admitting: Internal Medicine

## 2019-08-03 VITALS — BP 104/62 | HR 79 | Ht 70.0 in | Wt 318.0 lb

## 2019-08-03 DIAGNOSIS — F17210 Nicotine dependence, cigarettes, uncomplicated: Secondary | ICD-10-CM | POA: Diagnosis not present

## 2019-08-03 DIAGNOSIS — Z86718 Personal history of other venous thrombosis and embolism: Secondary | ICD-10-CM | POA: Insufficient documentation

## 2019-08-03 DIAGNOSIS — Z7984 Long term (current) use of oral hypoglycemic drugs: Secondary | ICD-10-CM | POA: Insufficient documentation

## 2019-08-03 DIAGNOSIS — Z6841 Body Mass Index (BMI) 40.0 and over, adult: Secondary | ICD-10-CM | POA: Diagnosis not present

## 2019-08-03 DIAGNOSIS — I2510 Atherosclerotic heart disease of native coronary artery without angina pectoris: Secondary | ICD-10-CM | POA: Diagnosis not present

## 2019-08-03 DIAGNOSIS — I43 Cardiomyopathy in diseases classified elsewhere: Secondary | ICD-10-CM | POA: Diagnosis not present

## 2019-08-03 DIAGNOSIS — I5032 Chronic diastolic (congestive) heart failure: Secondary | ICD-10-CM | POA: Insufficient documentation

## 2019-08-03 DIAGNOSIS — E785 Hyperlipidemia, unspecified: Secondary | ICD-10-CM | POA: Insufficient documentation

## 2019-08-03 DIAGNOSIS — Z7902 Long term (current) use of antithrombotics/antiplatelets: Secondary | ICD-10-CM | POA: Insufficient documentation

## 2019-08-03 DIAGNOSIS — Z833 Family history of diabetes mellitus: Secondary | ICD-10-CM | POA: Diagnosis not present

## 2019-08-03 DIAGNOSIS — R0789 Other chest pain: Secondary | ICD-10-CM | POA: Diagnosis not present

## 2019-08-03 DIAGNOSIS — Z8249 Family history of ischemic heart disease and other diseases of the circulatory system: Secondary | ICD-10-CM | POA: Diagnosis not present

## 2019-08-03 DIAGNOSIS — I110 Hypertensive heart disease with heart failure: Secondary | ICD-10-CM | POA: Diagnosis not present

## 2019-08-03 DIAGNOSIS — Z955 Presence of coronary angioplasty implant and graft: Secondary | ICD-10-CM | POA: Insufficient documentation

## 2019-08-03 DIAGNOSIS — J449 Chronic obstructive pulmonary disease, unspecified: Secondary | ICD-10-CM | POA: Insufficient documentation

## 2019-08-03 DIAGNOSIS — Z720 Tobacco use: Secondary | ICD-10-CM | POA: Diagnosis not present

## 2019-08-03 DIAGNOSIS — Z79899 Other long term (current) drug therapy: Secondary | ICD-10-CM | POA: Insufficient documentation

## 2019-08-03 DIAGNOSIS — E854 Organ-limited amyloidosis: Secondary | ICD-10-CM | POA: Diagnosis not present

## 2019-08-03 DIAGNOSIS — G4733 Obstructive sleep apnea (adult) (pediatric): Secondary | ICD-10-CM | POA: Insufficient documentation

## 2019-08-03 DIAGNOSIS — E7800 Pure hypercholesterolemia, unspecified: Secondary | ICD-10-CM | POA: Insufficient documentation

## 2019-08-03 DIAGNOSIS — Z7901 Long term (current) use of anticoagulants: Secondary | ICD-10-CM | POA: Insufficient documentation

## 2019-08-03 DIAGNOSIS — Z86711 Personal history of pulmonary embolism: Secondary | ICD-10-CM | POA: Diagnosis not present

## 2019-08-03 DIAGNOSIS — E1142 Type 2 diabetes mellitus with diabetic polyneuropathy: Secondary | ICD-10-CM | POA: Insufficient documentation

## 2019-08-03 DIAGNOSIS — M1990 Unspecified osteoarthritis, unspecified site: Secondary | ICD-10-CM | POA: Insufficient documentation

## 2019-08-03 NOTE — Addendum Note (Signed)
Encounter addended by: Scarlette Calico, RN on: 08/03/2019 10:21 AM  Actions taken: Clinical Note Signed

## 2019-08-03 NOTE — Patient Instructions (Signed)
Please call our office in December to schedule your follow up appointment  If you have any questions or concerns before your next appointment please send us a message through mychart or call our office at [phone].    TO LEAVE A MESSAGE FOR THE NURSE SELECT OPTION 2, PLEASE LEAVE A MESSAGE INCLUDING: . YOUR NAME . DATE OF BIRTH . CALL BACK NUMBER . REASON FOR CALL**this is important as we prioritize the call backs  YOU WILL RECEIVE A CALL BACK THE SAME DAY AS LONG AS YOU CALL BEFORE 4:00 PM  At the [facility], you and your health needs are our priority. As part of our continuing mission to provide you with exceptional heart care, we have created designated Provider Care Teams. These Care Teams include your primary Cardiologist (physician) and Advanced Practice Providers (APPs- Physician Assistants and Nurse Practitioners) who all work together to provide you with the care you need, when you need it.   You may see any of the following providers on your designated Care Team at your next follow up: . Dr Daniel [name] . Dr Dalton McLean . Amy Clegg, NP . Brittainy Simmons, PA . [name] Kemp, PharmD   Please be sure to bring in all your medications bottles to every appointment.    
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
## 2019-08-06 ENCOUNTER — Encounter (HOSPITAL_COMMUNITY): Payer: Self-pay

## 2019-08-06 ENCOUNTER — Ambulatory Visit: Payer: Medicaid Other | Admitting: Physical Therapy

## 2019-08-06 ENCOUNTER — Emergency Department (HOSPITAL_COMMUNITY): Payer: Medicaid Other

## 2019-08-06 ENCOUNTER — Emergency Department (HOSPITAL_COMMUNITY)
Admission: EM | Admit: 2019-08-06 | Discharge: 2019-08-06 | Disposition: A | Discharge status: Home / Self Care | Payer: Medicaid Other | Attending: Emergency Medicine | Admitting: Emergency Medicine

## 2019-08-06 ENCOUNTER — Telehealth (HOSPITAL_COMMUNITY): Payer: Self-pay | Admitting: Surgery

## 2019-08-06 ENCOUNTER — Other Ambulatory Visit (HOSPITAL_COMMUNITY): Payer: Self-pay

## 2019-08-06 DIAGNOSIS — F17210 Nicotine dependence, cigarettes, uncomplicated: Secondary | ICD-10-CM | POA: Diagnosis not present

## 2019-08-06 DIAGNOSIS — R42 Dizziness and giddiness: Secondary | ICD-10-CM | POA: Insufficient documentation

## 2019-08-06 DIAGNOSIS — Z79899 Other long term (current) drug therapy: Secondary | ICD-10-CM | POA: Diagnosis not present

## 2019-08-06 DIAGNOSIS — I5032 Chronic diastolic (congestive) heart failure: Secondary | ICD-10-CM | POA: Diagnosis not present

## 2019-08-06 DIAGNOSIS — I2510 Atherosclerotic heart disease of native coronary artery without angina pectoris: Secondary | ICD-10-CM | POA: Diagnosis not present

## 2019-08-06 DIAGNOSIS — I110 Hypertensive heart disease with heart failure: Secondary | ICD-10-CM | POA: Insufficient documentation

## 2019-08-06 DIAGNOSIS — R0602 Shortness of breath: Secondary | ICD-10-CM | POA: Diagnosis not present

## 2019-08-06 DIAGNOSIS — Z7901 Long term (current) use of anticoagulants: Secondary | ICD-10-CM | POA: Diagnosis not present

## 2019-08-06 DIAGNOSIS — R0789 Other chest pain: Secondary | ICD-10-CM | POA: Diagnosis not present

## 2019-08-06 DIAGNOSIS — R000 Tachycardia, unspecified: Secondary | ICD-10-CM | POA: Diagnosis not present

## 2019-08-06 LAB — CBC WITH DIFFERENTIAL/PLATELET
Abs Immature Granulocytes: 0.05 10*3/uL (ref 0.00–0.07)
Basophils Absolute: 0 10*3/uL (ref 0.0–0.1)
Basophils Relative: 0 %
Eosinophils Absolute: 0.1 10*3/uL (ref 0.0–0.5)
Eosinophils Relative: 1 %
HCT: 36.3 % — ABNORMAL LOW (ref 39.0–52.0)
Hemoglobin: 11.9 g/dL — ABNORMAL LOW (ref 13.0–17.0)
Immature Granulocytes: 1 %
Lymphocytes Relative: 24 %
Lymphs Abs: 2.2 10*3/uL (ref 0.7–4.0)
MCH: 29.3 pg (ref 26.0–34.0)
MCHC: 32.8 g/dL (ref 30.0–36.0)
MCV: 89.4 fL (ref 80.0–100.0)
Monocytes Absolute: 0.8 10*3/uL (ref 0.1–1.0)
Monocytes Relative: 8 %
Neutro Abs: 6.1 10*3/uL (ref 1.7–7.7)
Neutrophils Relative %: 66 %
Platelets: 247 10*3/uL (ref 150–400)
RBC: 4.06 MIL/uL — ABNORMAL LOW (ref 4.22–5.81)
RDW: 16.3 % — ABNORMAL HIGH (ref 11.5–15.5)
WBC: 9.2 10*3/uL (ref 4.0–10.5)
nRBC: 0 % (ref 0.0–0.2)

## 2019-08-06 LAB — BASIC METABOLIC PANEL
Anion gap: 9 (ref 5–15)
BUN: 31 mg/dL — ABNORMAL HIGH (ref 8–23)
CO2: 20 mmol/L — ABNORMAL LOW (ref 22–32)
Calcium: 9.3 mg/dL (ref 8.9–10.3)
Chloride: 109 mmol/L (ref 98–111)
Creatinine, Ser: 1.44 mg/dL — ABNORMAL HIGH (ref 0.61–1.24)
GFR calc Af Amer: >60 mL/min (ref >60)
GFR calc non Af Amer: 52 mL/min — ABNORMAL LOW (ref >60)
Glucose, Bld: 112 mg/dL — ABNORMAL HIGH (ref 70–99)
Potassium: 4 mmol/L (ref 3.5–5.1)
Sodium: 138 mmol/L (ref 135–145)

## 2019-08-06 LAB — BRAIN NATRIURETIC PEPTIDE: B Natriuretic Peptide: 41.8 pg/mL (ref 0.0–100.0)

## 2019-08-06 LAB — TROPONIN I (HIGH SENSITIVITY)
Troponin I (High Sensitivity): 22 ng/L — ABNORMAL HIGH (ref <18)
Troponin I (High Sensitivity): 18 ng/L — ABNORMAL HIGH (ref <18)

## 2019-08-06 MED ORDER — IPRATROPIUM BROMIDE HFA 17 MCG/ACT IN AERS
2.0000 | INHALATION_SPRAY | Freq: Once | RESPIRATORY_TRACT | Status: AC
  Administered 2019-08-06: 2 via RESPIRATORY_TRACT
  Filled 2019-08-06: qty 12.9

## 2019-08-06 MED ORDER — ALBUTEROL SULFATE HFA 108 (90 BASE) MCG/ACT IN AERS
4.0000 | INHALATION_SPRAY | Freq: Once | RESPIRATORY_TRACT | Status: AC
  Administered 2019-08-06: 4 via RESPIRATORY_TRACT
  Filled 2019-08-06: qty 6.7

## 2019-08-06 NOTE — ED Provider Notes (Signed)
Error   Deliah Boston, PA-C 08/15/19 0316    Lennice Sites, DO 08/20/19 [phone]

## 2019-08-06 NOTE — Discharge Instructions (Addendum)
At this time there does not appear to be the presence of an emergent medical condition, however there is always the potential for conditions to change. Please read and follow the below instructions.  Please return to the Emergency Department immediately for any new or worsening symptoms. Please be sure to follow up with your Primary Care Provider within one week regarding your visit today; please call their office to schedule an appointment even if you are feeling better for a follow-up visit. Avoid taking all of your home medications simultaneously, you may spread them out over an hour or so.  Please check in of water to avoid dehydration and get plenty of rest.  Please see your primary care doctor and your cardiologist this week for a recheck.   Get help right away if: You throw up (vomit) or have watery poop (diarrhea), and you cannot eat or drink anything. You have trouble: Talking. Walking. Swallowing. Using your arms, hands, or legs. You feel generally weak. You are not thinking clearly, or you have trouble forming sentences. A friend or family member may notice this. You have: Chest pain. Pain in your belly (abdomen). Shortness of breath. Sweating. Your vision changes. You are bleeding. You have a very bad headache. You have neck pain or a stiff neck. You have a fever. You have any new/concerning or worsening of symptoms  Please read the additional information packets attached to your discharge summary.  Do not take your medicine if  develop an itchy rash, swelling in your mouth or lips, or difficulty breathing; call 911 and seek immediate emergency medical attention if this occurs.  Note: Portions of this text may have been transcribed using voice recognition software. Every effort was made to ensure accuracy; however, inadvertent computerized transcription errors may still be present.

## 2019-08-06 NOTE — Telephone Encounter (Signed)
Received a call from [facility] Paramedic regarding [name].  Per heather he is signficantly SOB and feels as if he may pass out even sitting down.  She reports his BP is 90/60.  She also says that he has had no weight increase.  She will call for a truck to transport patient to ED.

## 2019-08-06 NOTE — Progress Notes (Signed)
Paramedicine Encounter    Patient ID: Russell Collier, male    DOB: 1961-01-05, 59 y.o.   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[place])    Acute respiratory failure ([place]) 07/27/2019   Breast tenderness in male 05/31/2019   Chronic pain 05/31/2019   AKI (acute kidney injury) ([place]) 05/31/2019   Pain due to onychomycosis of toenails of both feet 05/16/2019   Chronic knee pain 04/16/2019   Diabetes ([place]) 04/16/2019   Esophageal reflux 02/06/2019   Heartburn 02/06/2019   Chronic skin ulcer of lower leg ([place]) 12/21/2018   S/P drug eluting coronary stent placement    Coronary artery disease involving native coronary artery of native heart with unstable angina pectoris ([place]) 12/20/2018   Unstable angina ([place])    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   Acute on chronic diastolic heart failure ([place])    Shortness of breath    Precordial chest pain    Idiopathic peripheral neuropathy    Palpitations    Chronic diastolic heart failure (HCC)    Abnormal ankle brachial index (ABI)    Chest pain, rule out acute myocardial infarction 09/26/2018   Dyspepsia    Morbid obesity ([place])    OSA on CPAP    Chest pain 05/30/2018   CAD (coronary artery disease) 03/30/2018   Elevated troponin 02/03/2018   Positive urine drug screen 02/03/2018   Tobacco abuse 02/03/2018   Hyperlipidemia 02/03/2018   Acute respiratory failure with hypoxia ([place]) 02/02/2018   Acute congestive heart failure (HCC)    Atypical chest pain  12/27/2015   Essential hypertension    Neuropathy     Current Outpatient Medications:    albuterol (PROVENTIL) (2.5 MG/3ML) 0.083% nebulizer solution, Take 2.5 mg by nebulization every 6 (six) hours as needed for wheezing or shortness of breath., Disp: , Rfl:    albuterol (VENTOLIN HFA) 108 (90 Base) MCG/ACT inhaler, Inhale 2 puffs into the lungs every 6 (six) hours as needed for wheezing or shortness of breath., Disp: , Rfl:    APIXABAN (ELIQUIS) VTE STARTER PACK (10MG  AND 5MG ), Take as directed on package: start with two-5mg  tablets twice daily for 7 days. On day 8, switch to one-5mg  tablet twice daily., Disp: 1 each, Rfl: 0   atorvastatin (LIPITOR) 80 MG tablet, Take 1 tablet (80 mg total) by mouth daily., Disp: 90 tablet, Rfl: 3   clopidogrel (PLAVIX) 75 MG tablet, Take 1 tablet (75 mg total) by mouth daily with breakfast., Disp: 90 tablet, Rfl: 3   dapagliflozin propanediol (FARXIGA) 10 MG TABS tablet, Take 10 mg by mouth daily before breakfast., Disp: 30 tablet, Rfl: 6   enalapril (VASOTEC) 20 MG tablet, Take 20 mg by mouth 2 (two) times daily., Disp: , Rfl:    fluticasone (FLONASE) 50 MCG/ACT nasal spray, Place 1 spray into both nostrils daily., Disp: 18.2 mL, Rfl: 2   furosemide (LASIX) 40 MG tablet, Take 1 tablet (40 mg total) by mouth 2 (two) times daily., Disp: 180 tablet, Rfl: 3   isosorbide mononitrate (IMDUR) 30 MG 24 hr tablet, Take 1 tablet (30 mg total)  by mouth daily., Disp: 90 tablet, Rfl: 3   Multiple Vitamin (MULTIVITAMIN WITH MINERALS) TABS tablet, Take 1 tablet by mouth daily., Disp: , Rfl:    pantoprazole (PROTONIX) 40 MG tablet, Take 1 tablet (40 mg total) by mouth daily., Disp: 90 tablet, Rfl: 0   potassium chloride SA (KLOR-CON) 20 MEQ tablet, TAKE 1 TABLET(20 MEQ) BY MOUTH DAILY, Disp: 90 tablet, Rfl: 3   pregabalin (LYRICA) 100 MG capsule, Take 1 tablet three times daily, Disp: 270 capsule, Rfl: 3   Tafamidis (VYNDAMAX) 61 MG CAPS, Take 61 mg by mouth  daily., Disp: , Rfl:    umeclidinium bromide (INCRUSE ELLIPTA) 62.5 MCG/INH AEPB, Inhale 1 puff into the lungs daily., Disp: 7 each, Rfl: 0   valACYclovir (VALTREX) 500 MG tablet, Take 1 tablet (500 mg total) by mouth daily., Disp: 90 tablet, Rfl: 3   spironolactone (ALDACTONE) 25 MG tablet, Take 0.5 tablets (12.5 mg total) by mouth daily. (Patient not taking: Reported on 08/06/2019), Disp: 45 tablet, Rfl: 3 Allergies  Allergen Reactions   Varenicline Other (See Comments)    Patient reports laryngospasm stopped in the ER     Social History   Socioeconomic History   Marital status: Divorced    Spouse name: Not on file   Number of children: 2   Years of education: 10   Highest education level: Not on file  Occupational History   Occupation: not employed  Tobacco Use   Smoking status: Current Every Day Smoker    Packs/day: 0.50    Years: 54.00    Pack years: 27.00    Types: Cigarettes, Cigars   Smokeless tobacco: Never Used   Tobacco comment: half pack daily  Vaping Use   Vaping Use: Never used  Substance and Sexual Activity   Alcohol use: Yes    Alcohol/week: 4.0 standard drinks    Types: 2 Cans of beer, 2 Shots of liquor per week   Drug use: Not Currently    Comment: last use may last year   Sexual activity: Yes  Other Topics Concern   Not on file  Social History Narrative   Right handed   Two story home   No caffeine   Social Determinants of Health   Financial Resource Strain: Low Risk    Difficulty of Paying Living Expenses: Not very hard  Food Insecurity: No Food Insecurity   Worried About Charity fundraiser in the Last Year: Never true   Ran Out of Food in the Last Year: Never true  Transportation Needs: No Transportation Needs   Lack of Transportation (Medical): No   Lack of Transportation (Non-Medical): No  Physical Activity: Insufficiently Active   Days of Exercise per Week: 2 days   Minutes of Exercise per Session: 20 min   Stress: No Stress Concern Present   Feeling of Stress : Not at all  Social Connections: Moderately Isolated   Frequency of Communication with Friends and Family: More than three times a week   Frequency of Social Gatherings with Friends and Family: Twice a week   Attends Religious Services: 1 to 4 times per year   Active Member of [employer] or Organizations: No   Attends Archivist Meetings: Never   Marital Status: Divorced  Human resources officer Violence: Not At Risk   Fear of Current or Ex-Partner: No   Emotionally Abused: No   Physically Abused: No   Sexually Abused: No    Physical Exam Vitals reviewed.  Constitutional:  Appearance: He is normal weight. He is ill-appearing.  HENT:     Head: Normocephalic.     Mouth/Throat:     Mouth: Mucous membranes are dry.  Eyes:     Pupils: Pupils are equal, round, and reactive to light.  Cardiovascular:     Rate and Rhythm: Regular rhythm. Tachycardia present.     Pulses: Normal pulses.     Heart sounds: Normal heart sounds.  Pulmonary:     Effort: Respiratory distress present.     Breath sounds: Normal breath sounds.  Abdominal:     Palpations: Abdomen is soft.  Musculoskeletal:     Cervical back: Normal range of motion.     Right lower leg: No edema.     Left lower leg: No edema.     Comments: Right leg pain.   Skin:    General: Skin is warm and dry.     Capillary Refill: Capillary refill takes less than 2 seconds.  Neurological:     Mental Status: He is alert. Mental status is at baseline.     Motor: Weakness present.         Future Appointments  Date Time Provider [place]  08/06/2019  1:30 PM Carney Living, PT [facility]  08/10/2019 10:10 AM ACCESS TO CARE POOL [facility] [place]  08/16/2019 10:00 AM [facility] [place] [facility]  08/16/2019  2:45 PM [name], MD [facility]  08/21/2019  3:15 PM Gardiner Barefoot, DPM [facility]     ACTION: Home visit  completed Next visit planned for one week  Arrived for home visit for Bartley appearing short of breath and unsteady sitting in chair. Kylon reports he feels really bad with shortness of breath, dizziness and right leg pain with weakness and trouble standing. Jaysun stated he has had dizziness for two days and began feeling short of breath today. Vitals reflect hypotension with tachycardia. Kimmie was short of breath with normal breath sounds but placed on oxygen for comfort. I contacted heart failure clinic and Halley began to appear as if he was going to pass out from sitting position. Clinic and I agreed to transport patient to ED. EKG obtained. EMS called and unit arrived transporting patient to [facility]. I reviewed medication and confirmed pills filling pill box. I will continue to follow up with [name] and see him next week or sooner if needed.   Refills: NONE
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
## 2019-08-06 NOTE — ED Triage Notes (Signed)
Pt arrives to ED w/ c/o sob since Saturday and c/o dizzi ness this AM. Pt denies chest pain. Pt has significant cardiac hx. EMS VS:  BP 90/60, HR 108, RR 24, SPO2 98% on RA, CBG 140

## 2019-08-09 ENCOUNTER — Other Ambulatory Visit: Payer: Self-pay | Admitting: Family Medicine

## 2019-08-09 NOTE — Progress Notes (Signed)
Subjective:   Patient ID: Russell Collier    DOB: 01-Nov-1960, 59 y.o. male   MRN: [id number]  Russell Collier is a 59 y.o. male with a history of essential hypertension, diastolic heart failure, coronary artery disease, OSA on CPAP, peripheral neuropathy, tobacco abuse, hyperlipidemia, dyspepsia, morbid obesity, atypical chest pain, heredofamilial amyloidosis here for hospital follow-up.  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  Cardiology was consulted and did not feel chest pain was cardiac in origin.  CTA obtained and notable for possible segmental PE vs artifact. Given patient had history of PE, he was started on IV heparin and then transitioned to p.o. Eliquis prior to discharge.  He was also started on Incruse Ellipta for COPD.  Upon day of discharge he had no shortness of breath or chest pain. He was then seen in ED on 6/10 for similar symptoms of mild chest pain shortness of breath she described as acute onset, constant, persistent.  Endorsed a nonproductive cough.  Denied palpitations or pleuritic pain, sore throat, fever.  Repeat chest x-ray at that time was notable for emphysema, no pneumonia.  Troponins trended flat. He was seen by Dr.Bensihhon on 6/11 who recommended continuing chronic medications. Reviewed CTA and did not feel patient had PE but did recommend continuing Eliquis given his history. He has a Zip patch (since 05/01/19) that has showed brief tachycardia and <1% of PVCs. He was see in ED on 6/14 for SOB and lightheadedness when standing and found to have low blood pressures 90/60 after taking all 14 of his home medications at one time. By the time he entered the ambulance, his  symptoms improved. His physical exam at that time was significant for wheezing. Orthostatics at that time was negative. He had his baseline anemia of 11.9, troponins similar to days prior, BNP 41.8.   AKI: During admission patient was noted to have an AKI with creatinine of 1.81.  Creatinine on discharge was 1.28.  He was instructed to avoid NSAIDs and aspirin. He was seen in the ED on 6/10 with Cr 1.50 and 6/14 with Cr 1.44. Patient denies use of NSAIDs.  Chronic Knee Pain: Patient notes that he has been having severe knee pain. He is scheduled to see orthopedic doctor on Tuesday (6/21) but notes that Lidocaine patches that he was given is very helpful. He requests a refill until he can see his orthopedic. He notes that the pain is keeping him up at night.   Review of Systems:  Per HPI.   Objective:   BP 112/72   Pulse 89   Ht 5\' 10"  (1.778 m)   Wt (!) 317 lb 9.6 oz (144.1 kg)   SpO2 97%   BMI 45.57 kg/m  Vitals and nursing note reviewed.  General: pleasant older male, sitting in exam chair with cane at side, well nourished, well developed, in no acute distress with non-toxic appearance Neck: supple, normal ROM CV: regular rate and rhythm without murmurs, rubs, or gallops, 1+ pitting edema R>L , 2+ radial and pedal pulses bilaterally Lungs: clear to auscultation bilaterally with normal work of breathing, no wheezes, rales, or rhonchi appreciated  Skin: warm, dry Extremities: warm and well perfused MSK: Right knee diffusely  tender to palpation, mild effusion appreciated, no overlying erythema or warmth, pain with ROM Neuro: Alert and oriented, speech normal  Bilateral Knee X-ray 07/24/19  FINDINGS: Some irregularity of the medial and lateral joint lines of both  knees. Most prominent is the significant medial joint interval narrowing  on the right. No fracture or bony lesion or acute findings.  Right Knee X-ray 07/24/19: FINDINGS: Osteophytes and subchondral irregularity of the  right patella  from moderate patellofemoral arthritis. Small osteophyte off the  posterior condyle. No fracture or bony lesion or acute findings.   Assessment & Plan:   AKI (acute kidney injury) ([place]) Repeat BMP today with continued elevation in Cr at 1.59. At time of discharge, Cr 1.28 with continued worsening since. He denies use of NSAIDs however with his severe knee pain recently, use may have occurred that he did not reveal. History of amyloidosis - wonder if developing kidney involvement? - Encouraged to avoid NSAIDs/ASA. He is aware that given use of anticoagulation, it is highly recommended to avoid these medications due to increase in bleeding as well. -  Decreasing Enalapril to 20mg  QHS for soft blood pressures - Has follow up with PCP in 1 week - recommend repeat BMP at that time.  - Consider decreasing Lasix to QD given limited improvement in LE edema and HFpEF - consider UA to evaluate if any proteinuria. Consider referral to nephrologist.   Shortness of breath Likely multifactorial in setting of COPD, deconditioning, possible PE, and soft blood pressures. Since discharge, patient has had improvement in symptoms.  - continue Incruse daily - refills provided and education on importance discussed - decrease Enalapril to 20mg  Qhs due to hypotension/soft blood pressures and to decrease number of medications taken at once in the morning - Follow up with PCP in 1 week for blood pressure check, can consider decreasing Enalapril further if needed - consider referral to pulmonology if symptoms persist given number of ED visits/hospitalizations  - Continue Eliquis PO as prescribed  - Would benefit from PT/OT for deconditioning and impaired mobility - Home health PT/OT ordered today  Chronic knee pain Chronic, worsening. Significant right knee pain in setting of known degenerative joint disease R>L. He has appointment with orthopedics on Tuesday. Has found relief with Lidocaine patches.  Given patient must avoid NSAID's, will provide short course of Lidocaine patches to get him through until he can follow up with ortho. Recommended heating pad, topical analgesics, activity as tolerated. Avoid NSAIDs as above.   Lower extremity edema Chronic, stable. Suspect dependent edema given more recent sedentary lifestyle as well as known obesity. Does have improvement with elevation which supports this thought. Last UA on 07/27/19 was negative for protein which is reassuring.  - Recommended elevation as often as able.  - Continue Lasix as prescribed, but given limited improvement, consider decreasing to QD use vs discontinuing (h/o HFpEF with G2DD).  - If worsenning can consider repeat UA for protein analysis +/- repeat echo.    Orders Placed This Encounter  Procedures  . Basic Metabolic Panel  . Ambulatory referral to Home Health    Referral Priority:   Routine    Referral Type:   Home Health Care    Referral Reason:   Specialty Services Required    Requested Specialty:   [place]    Number of Visits Requested:   1   Meds ordered this encounter  Medications  . lidocaine (LIDODERM) 5 %    Sig: Place 1 patch onto the  skin daily. Remove & Discard patch within 12 hours or as directed by MD    Dispense:  5 patch    Refill:  0  . umeclidinium bromide (INCRUSE ELLIPTA) 62.5 MCG/INH AEPB    Sig: Inhale 1 puff into the lungs daily.    Dispense:  30 each    Refill:  [place], DO PGY-2, [facility] 08/12/2019 10:42 AM

## 2019-08-10 ENCOUNTER — Other Ambulatory Visit: Payer: Self-pay

## 2019-08-10 ENCOUNTER — Ambulatory Visit (INDEPENDENT_AMBULATORY_CARE_PROVIDER_SITE_OTHER): Payer: Medicaid Other | Admitting: Family Medicine

## 2019-08-10 VITALS — BP 112/72 | HR 89 | Ht 70.0 in | Wt 317.6 lb

## 2019-08-10 DIAGNOSIS — R600 Localized edema: Secondary | ICD-10-CM

## 2019-08-10 DIAGNOSIS — N179 Acute kidney failure, unspecified: Secondary | ICD-10-CM | POA: Diagnosis not present

## 2019-08-10 DIAGNOSIS — M25562 Pain in left knee: Secondary | ICD-10-CM | POA: Diagnosis not present

## 2019-08-10 DIAGNOSIS — G8929 Other chronic pain: Secondary | ICD-10-CM | POA: Diagnosis not present

## 2019-08-10 DIAGNOSIS — M25561 Pain in right knee: Secondary | ICD-10-CM | POA: Diagnosis not present

## 2019-08-10 DIAGNOSIS — R0602 Shortness of breath: Secondary | ICD-10-CM

## 2019-08-10 MED ORDER — UMECLIDINIUM BROMIDE 62.5 MCG/INH IN AEPB: 1.0000 | INHALATION_SPRAY | Freq: Every day | RESPIRATORY_TRACT | 3 refills | Status: DC

## 2019-08-10 MED ORDER — LIDOCAINE 5 % EX PTCH: 1.0000 | MEDICATED_PATCH | CUTANEOUS | 0 refills | Status: DC

## 2019-08-10 NOTE — Patient Instructions (Signed)
Thank you for coming to see me today. It was a pleasure to see you.   For your blood pressure: Start taking the Enalapril 20mg : one tablet at night. Do not take it in the morning anymore.  Follow up with your PCP, Dr. Caron Presume on 6/24 as scheduled for a blood pressure check. Use the Incruse EVERYDAY as prescribed.  I have placed another referral for home health PT and OT because I think your fatigue is from deconditioning.   For any wounds, I recommend cleansing with normal saline. You can soak in [place] and apply MediHoney. Come back if any signs of infection.        We are checking some labs today, I will call you if they are abnormal will send you a MyChart message or a letter if they are normal.  If you do not hear about your labs in the next 2 weeks please let us know.  If you have any questions or concerns, please do not hesitate to call the office at [phone].  Take Care,  Dr. Mina Marble, DO Resident Physician [place] [phone]

## 2019-08-11 LAB — BASIC METABOLIC PANEL
BUN/Creatinine Ratio: 17 (ref 10–24)
BUN: 27 mg/dL (ref 8–27)
CO2: 19 mmol/L — ABNORMAL LOW (ref 20–29)
Calcium: 9.7 mg/dL (ref 8.6–10.2)
Chloride: 102 mmol/L (ref 96–106)
Creatinine, Ser: 1.59 mg/dL — ABNORMAL HIGH (ref 0.76–1.27)
GFR calc Af Amer: 53 mL/min/{1.73_m2} — ABNORMAL LOW (ref >59)
GFR calc non Af Amer: 46 mL/min/{1.73_m2} — ABNORMAL LOW (ref >59)
Glucose: 111 mg/dL — ABNORMAL HIGH (ref 65–99)
Potassium: 4.7 mmol/L (ref 3.5–5.2)
Sodium: 142 mmol/L (ref 134–144)

## 2019-08-12 NOTE — Assessment & Plan Note (Signed)
Chronic, worsening. Significant right knee pain in setting of known degenerative joint disease R>L. He has appointment with orthopedics on Tuesday. Has found relief with Lidocaine patches. Given patient must avoid NSAID's, will provide short course of Lidocaine patches to get him through until he can follow up with ortho. Recommended heating pad, topical analgesics, activity as tolerated. Avoid NSAIDs as above.

## 2019-08-12 NOTE — Assessment & Plan Note (Addendum)
Repeat BMP today with continued elevation in Cr at 1.59. At time of discharge, Cr 1.28 with continued worsening since. He denies use of NSAIDs however with his severe knee pain recently, use may have occurred that he did not reveal. History of amyloidosis - wonder if developing kidney involvement? - Encouraged to avoid NSAIDs/ASA. He is aware that given use of anticoagulation, it is highly recommended to avoid these medications due to increase in bleeding as well. -  Decreasing Enalapril to 20mg  QHS for soft blood pressures - Has follow up with PCP in 1 week - recommend repeat BMP at that time.  - Consider decreasing Lasix to QD given limited improvement in LE edema and HFpEF - consider UA to evaluate if any proteinuria. Consider referral to nephrologist.

## 2019-08-12 NOTE — Assessment & Plan Note (Addendum)
Chronic, stable. Suspect dependent edema given more recent sedentary lifestyle as well as known obesity. Does have improvement with elevation which supports this thought. Last UA on 07/27/19 was negative for protein which is reassuring.  - Recommended elevation as often as able.  - Continue Lasix as prescribed, but given limited improvement, consider decreasing to QD use vs discontinuing (h/o HFpEF with G2DD).  - If worsenning can consider repeat UA for protein analysis +/- repeat echo.

## 2019-08-12 NOTE — Assessment & Plan Note (Addendum)
Likely multifactorial in setting of COPD, deconditioning, possible PE, and soft blood pressures. Since discharge, patient has had improvement in symptoms.  - continue Incruse daily - refills provided and education on importance discussed - decrease Enalapril to 20mg  Qhs due to hypotension/soft blood pressures and to decrease number of medications taken at once in the morning - Follow up with PCP in 1 week for blood pressure check, can consider decreasing Enalapril further if needed - consider referral to pulmonology if symptoms persist given number of ED visits/hospitalizations  - Continue Eliquis PO as prescribed  - Would benefit from PT/OT for deconditioning and impaired mobility - Home health PT/OT ordered today

## 2019-08-13 ENCOUNTER — Other Ambulatory Visit: Payer: Self-pay | Admitting: Family Medicine

## 2019-08-13 ENCOUNTER — Other Ambulatory Visit (HOSPITAL_COMMUNITY): Payer: Self-pay

## 2019-08-13 ENCOUNTER — Telehealth: Payer: Self-pay | Admitting: *Deleted

## 2019-08-13 DIAGNOSIS — J439 Emphysema, unspecified: Secondary | ICD-10-CM

## 2019-08-13 MED ORDER — ANORO ELLIPTA 62.5-25 MCG/INH IN AEPB: 1.0000 | INHALATION_SPRAY | Freq: Every day | RESPIRATORY_TRACT | 2 refills | Status: DC

## 2019-08-13 NOTE — Telephone Encounter (Signed)
Attempted to call patient and let him no.  No answer and VM was full. Christen Bame, CMA

## 2019-08-13 NOTE — Telephone Encounter (Signed)
We can transition to [place]. Are you able to make this transition or do I need to?

## 2019-08-13 NOTE — Telephone Encounter (Signed)
Okay New rx called in.

## 2019-08-13 NOTE — Progress Notes (Signed)
Paramedicine Encounter    Patient ID: Russell Collier, male    DOB: Nov 25, 1960, 59 y.o.   MRN: [id number]   Patient Care Team: [name], MD as PCP - General (Family Medicine) [name], MD as PCP - Cardiology (Cardiology) Michael Boston, MD as Consulting Physician (General Surgery) Wilford Corner, MD as Consulting Physician (Gastroenterology) Jorge Ny, LCSW as Social Worker (Licensed Clinical Social Worker) Alda Berthold, DO as Consulting Physician (Neurology)  Patient Active Problem List   Diagnosis Date Noted  . Lower extremity edema   . 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 Acute respiratory failure ([place]) 07/27/2019  . Breast tenderness in male 05/31/2019  . Chronic pain 05/31/2019  . AKI (acute kidney injury) ([place]) 05/31/2019  . Pain due to onychomycosis of toenails of both feet 05/16/2019  . Chronic knee pain 04/16/2019  . Diabetes ([place]) 04/16/2019  . Esophageal reflux 02/06/2019  . Heartburn 02/06/2019  . Chronic skin ulcer of lower leg ([place]) 12/21/2018  . S/P drug eluting coronary stent placement   . Coronary artery disease involving native coronary artery of native heart with unstable angina pectoris ([place]) 12/20/2018  . Unstable angina ([place])   . 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 Acute on chronic diastolic heart failure ([place])   . Shortness of breath   . Precordial chest pain   . Idiopathic peripheral neuropathy   . Palpitations   . Chronic diastolic heart failure ([place])   . Abnormal ankle brachial index (ABI)   . Chest pain, rule out acute myocardial infarction 09/26/2018  . Dyspepsia   . Morbid obesity ([place])   . OSA on CPAP   . Chest pain 05/30/2018  . CAD (coronary artery disease) 03/30/2018  . Elevated troponin 02/03/2018  . Positive urine drug screen 02/03/2018  . Tobacco abuse 02/03/2018  . Hyperlipidemia 02/03/2018  . Acute respiratory failure with hypoxia ([place]) 02/02/2018  . Acute congestive heart failure ([place])   . Atypical chest pain  12/27/2015  . Essential hypertension   . Neuropathy     Current Outpatient Medications:  .  albuterol (PROVENTIL) (2.5 MG/3ML) 0.083% nebulizer solution, Take 2.5 mg by nebulization every 6 (six) hours as needed for wheezing or shortness of breath., Disp: , Rfl:  .  albuterol (VENTOLIN HFA) 108 (90 Base) MCG/ACT inhaler, Inhale 2 puffs into the lungs every 6 (six) hours as needed for wheezing or shortness of breath., Disp: , Rfl:  .  APIXABAN (ELIQUIS) VTE STARTER PACK (10MG  AND 5MG ), Take as directed on package: start with two-5mg  tablets twice daily for 7 days. On day 8, switch to one-5mg  tablet twice daily. (Patient taking differently: Take 5-10 mg by mouth 2 (two) times daily. Take as directed on package: start with two-5mg  tablets twice daily for 7 days. On day 8, switch to one-5mg  tablet twice daily.), Disp: 1 each, Rfl: 0 .  atorvastatin (LIPITOR) 80 MG tablet, Take 1 tablet (80 mg total) by mouth daily., Disp: 90 tablet, Rfl: 3 .  clopidogrel (PLAVIX) 75 MG tablet, Take 1 tablet (75 mg total) by mouth daily with breakfast., Disp: 90 tablet, Rfl: 3 .  dapagliflozin propanediol (FARXIGA) 10 MG TABS tablet, Take 10 mg by mouth daily before breakfast., Disp: 30 tablet, Rfl: 6 .  enalapril (VASOTEC) 20 MG tablet, Take 20 mg by mouth at bedtime., Disp: , Rfl:  .  fluticasone (FLONASE) 50 MCG/ACT nasal spray, PLACE 1 SPRAY INTO BOTH NOSTRILS DAILY., Disp: 16 g, Rfl: 11 .  furosemide (LASIX) 40 MG tablet,  Take 1 tablet (40 mg total) by mouth 2 (two) times daily., Disp: 180 tablet, Rfl: 3 .  isosorbide mononitrate (IMDUR) 30 MG 24 hr tablet, Take 1 tablet (30 mg total) by mouth daily., Disp: 90 tablet, Rfl: 3 .  lidocaine (LIDODERM) 5 %, Place 1 patch onto the skin daily. Remove & Discard patch within 12 hours or as directed by MD, Disp: 5 patch, Rfl: 0 .  Multiple Vitamin (MULTIVITAMIN WITH MINERALS) TABS tablet, Take 1 tablet by mouth daily., Disp: , Rfl:  .  pantoprazole (PROTONIX) 40 MG tablet,  Take 1 tablet (40 mg total) by mouth daily., Disp: 90 tablet, Rfl: 0 .  potassium chloride SA (KLOR-CON) 20 MEQ tablet, TAKE 1 TABLET(20 MEQ) BY MOUTH DAILY (Patient taking differently: Take 20 mEq by mouth daily. TAKE 1 TABLET(20 MEQ) BY MOUTH DAILY), Disp: 90 tablet, Rfl: 3 .  pregabalin (LYRICA) 100 MG capsule, Take 1 tablet three times daily (Patient taking differently: Take 100 mg by mouth 3 (three) times daily. Take 1 tablet three times daily), Disp: 270 capsule, Rfl: 3 .  Tafamidis (VYNDAMAX) 61 MG CAPS, Take 61 mg by mouth daily., Disp: , Rfl:  .  valACYclovir (VALTREX) 500 MG tablet, Take 1 tablet (500 mg total) by mouth daily., Disp: 90 tablet, Rfl: 3 .  umeclidinium-vilanterol (ANORO ELLIPTA) 62.5-25 MCG/INH AEPB, Inhale 1 puff into the lungs daily., Disp: 60 each, Rfl: 2 Allergies  Allergen Reactions  . Varenicline Other (See Comments)    Patient reports laryngospasm stopped in the ER     Social History   Socioeconomic History  . Marital status: Divorced    Spouse name: Not on file  . Number of children: 2  . Years of education: 10  . Highest education level: Not on file  Occupational History  . Occupation: not employed  Tobacco Use  . Smoking status: Current Every Day Smoker    Packs/day: 0.50    Years: 54.00    Pack years: 27.00    Types: Cigarettes, Cigars  . Smokeless tobacco: Never Used  . Tobacco comment: half pack daily  Vaping Use  . Vaping Use: Never used  Substance and Sexual Activity  . Alcohol use: Yes    Alcohol/week: 4.0 standard drinks    Types: 2 Cans of beer, 2 Shots of liquor per week  . Drug use: Not Currently    Comment: last use may last year  . Sexual activity: Yes  Other Topics Concern  . Not on file  Social History Narrative   Right handed   Two story home   No caffeine   Social Determinants of Health   Financial Resource Strain: Low Risk   . Difficulty of Paying Living Expenses: Not very hard  Food Insecurity: No Food Insecurity   . Worried About Charity fundraiser in the Last Year: Never true  . Ran Out of Food in the Last Year: Never true  Transportation Needs: No Transportation Needs  . Lack of Transportation (Medical): No  . Lack of Transportation (Non-Medical): No  Physical Activity: Insufficiently Active  . Days of Exercise per Week: 2 days  . Minutes of Exercise per Session: 20 min  Stress: No Stress Concern Present  . Feeling of Stress : Not at all  Social Connections: Moderately Isolated  . Frequency of Communication with Friends and Family: More than three times a week  . Frequency of Social Gatherings with Friends and Family: Twice a week  . Attends Religious Services: 1  to 4 times per year  . Active Member of Clubs or Organizations: No  . Attends Archivist Meetings: Never  . Marital Status: Divorced  Human resources officer Violence: Not At Risk  . Fear of Current or Ex-Partner: No  . Emotionally Abused: No  . Physically Abused: No  . Sexually Abused: No    Physical Exam Vitals reviewed.  Constitutional:      Appearance: Normal appearance.  HENT:     Head: Normocephalic.     Nose: Nose normal.     Mouth/Throat:     Mouth: Mucous membranes are moist.  Eyes:     Pupils: Pupils are equal, round, and reactive to light.  Cardiovascular:     Rate and Rhythm: Regular rhythm. Tachycardia present.     Pulses: Normal pulses.     Heart sounds: Normal heart sounds.  Pulmonary:     Effort: Pulmonary effort is normal.     Breath sounds: Normal breath sounds.  Abdominal:     Palpations: Abdomen is soft.  Musculoskeletal:        General: Swelling and tenderness present. Normal range of motion.     Cervical back: Normal range of motion.     Right lower leg: Edema present.     Left lower leg: Edema present.  Skin:    General: Skin is warm.     Capillary Refill: Capillary refill takes less than 2 seconds.  Neurological:     Mental Status: He is alert. Mental status is at baseline.   Psychiatric:        Mood and Affect: Mood normal.     Arrived for home visit for Russell Collier who was seated at his kitchen table alert and oriented reporting he is feeling pretty good other than having some knee pain. Adiel reports he is seeing Dr. Linton Rump about same tomorrow at the ortho doctor. Vitals were obtained and as noted. Physical assessment noted some leg edema with tenderness on palpation. Patient reports he ate a lot of BBQ and cookout style meals all weekend. I educated him on proper things to eat to reduce edema and shortness of breath. Patient verbalized understanding. Medications were reviewed and confirmed. I reviewed medications with [facility] as well to ensure patient is getting 90 days supply as requested. Pill box filled accordingly. Home visit complete. I will see patient in one week.     Future Appointments  Date Time Provider [place]  08/16/2019 10:00 AM [facility]  08/16/2019  2:45 PM [name], MD [facility]  08/21/2019  3:15 PM Gardiner Barefoot, DPM [facility]  08/29/2019  3:00 PM Carney Living, PT [facility]     ACTION: Home visit completed Next visit planned for one week

## 2019-08-13 NOTE — Telephone Encounter (Signed)
Incruse not covered by Medicaid.  Please see below of formulary.  Let "RN Team" know if you are changing to covered medications or would like to pursue a PA. Christen Bame, CMA

## 2019-08-13 NOTE — Telephone Encounter (Signed)
Sorry, this would need to be initiated by you and sent in. Christen Bame, CMA

## 2019-08-14 ENCOUNTER — Encounter (HOSPITAL_COMMUNITY): Payer: Self-pay | Admitting: Emergency Medicine

## 2019-08-14 ENCOUNTER — Emergency Department (HOSPITAL_COMMUNITY): Payer: Medicaid Other

## 2019-08-14 ENCOUNTER — Emergency Department (HOSPITAL_COMMUNITY)
Admission: EM | Admit: 2019-08-14 | Discharge: 2019-08-15 | Disposition: A | Discharge status: Home / Self Care | Payer: Medicaid Other | Attending: Emergency Medicine | Admitting: Emergency Medicine

## 2019-08-14 ENCOUNTER — Other Ambulatory Visit: Payer: Self-pay

## 2019-08-14 DIAGNOSIS — Z9981 Dependence on supplemental oxygen: Secondary | ICD-10-CM | POA: Diagnosis not present

## 2019-08-14 DIAGNOSIS — Z79899 Other long term (current) drug therapy: Secondary | ICD-10-CM | POA: Insufficient documentation

## 2019-08-14 DIAGNOSIS — R002 Palpitations: Secondary | ICD-10-CM | POA: Diagnosis not present

## 2019-08-14 DIAGNOSIS — E119 Type 2 diabetes mellitus without complications: Secondary | ICD-10-CM | POA: Diagnosis not present

## 2019-08-14 DIAGNOSIS — I5032 Chronic diastolic (congestive) heart failure: Secondary | ICD-10-CM | POA: Insufficient documentation

## 2019-08-14 DIAGNOSIS — R0789 Other chest pain: Secondary | ICD-10-CM | POA: Diagnosis not present

## 2019-08-14 DIAGNOSIS — I2510 Atherosclerotic heart disease of native coronary artery without angina pectoris: Secondary | ICD-10-CM | POA: Insufficient documentation

## 2019-08-14 DIAGNOSIS — I959 Hypotension, unspecified: Secondary | ICD-10-CM | POA: Diagnosis not present

## 2019-08-14 DIAGNOSIS — M1711 Unilateral primary osteoarthritis, right knee: Secondary | ICD-10-CM | POA: Diagnosis not present

## 2019-08-14 DIAGNOSIS — Z7901 Long term (current) use of anticoagulants: Secondary | ICD-10-CM | POA: Diagnosis not present

## 2019-08-14 DIAGNOSIS — R079 Chest pain, unspecified: Secondary | ICD-10-CM | POA: Diagnosis not present

## 2019-08-14 DIAGNOSIS — I110 Hypertensive heart disease with heart failure: Secondary | ICD-10-CM | POA: Diagnosis not present

## 2019-08-14 LAB — BASIC METABOLIC PANEL
Anion gap: 9 (ref 5–15)
BUN: 29 mg/dL — ABNORMAL HIGH (ref 8–23)
CO2: 23 mmol/L (ref 22–32)
Calcium: 8.8 mg/dL — ABNORMAL LOW (ref 8.9–10.3)
Chloride: 106 mmol/L (ref 98–111)
Creatinine, Ser: 1.52 mg/dL — ABNORMAL HIGH (ref 0.61–1.24)
GFR calc Af Amer: 56 mL/min — ABNORMAL LOW (ref >60)
GFR calc non Af Amer: 49 mL/min — ABNORMAL LOW (ref >60)
Glucose, Bld: 124 mg/dL — ABNORMAL HIGH (ref 70–99)
Potassium: 3.7 mmol/L (ref 3.5–5.1)
Sodium: 138 mmol/L (ref 135–145)

## 2019-08-14 LAB — TROPONIN I (HIGH SENSITIVITY): Troponin I (High Sensitivity): 16 ng/L (ref <18)

## 2019-08-14 LAB — CBC
HCT: 35.5 % — ABNORMAL LOW (ref 39.0–52.0)
Hemoglobin: 11.5 g/dL — ABNORMAL LOW (ref 13.0–17.0)
MCH: 29.7 pg (ref 26.0–34.0)
MCHC: 32.4 g/dL (ref 30.0–36.0)
MCV: 91.7 fL (ref 80.0–100.0)
Platelets: 253 10*3/uL (ref 150–400)
RBC: 3.87 MIL/uL — ABNORMAL LOW (ref 4.22–5.81)
RDW: 17.1 % — ABNORMAL HIGH (ref 11.5–15.5)
WBC: 9.8 10*3/uL (ref 4.0–10.5)
nRBC: 0 % (ref 0.0–0.2)

## 2019-08-14 LAB — PROTIME-INR
INR: 1.1 (ref 0.8–1.2)
Prothrombin Time: 13.5 seconds (ref 11.4–15.2)

## 2019-08-14 MED ORDER — SODIUM CHLORIDE 0.9% FLUSH: 3.0000 mL | Freq: Once | INTRAVENOUS | Status: DC

## 2019-08-14 NOTE — ED Triage Notes (Signed)
Patient arrived with EMS from home reports left chest pain with SOB this evening , no emesis or diaphoresis , denies cough or fever , history of CHF his cardiologist is Dr. Missy Sabins.

## 2019-08-15 LAB — BRAIN NATRIURETIC PEPTIDE: B Natriuretic Peptide: 28.3 pg/mL (ref 0.0–100.0)

## 2019-08-15 LAB — TROPONIN I (HIGH SENSITIVITY): Troponin I (High Sensitivity): 19 ng/L — ABNORMAL HIGH (ref <18)

## 2019-08-15 MED FILL — VYNDAMAX 61 MG CAPS: 61 | 30 days supply | Qty: 30 | Fill #8

## 2019-08-15 NOTE — Discharge Planning (Signed)
Transition of Care [facility]) - Emergency Department Mini Assessment   Patient Details  Name: Russell Collier MRN: [id number] Date of Birth: [birth year]  [facility] CM/SW Contact:    Fuller Mandril, RN Phone Number: 08/15/2019, 3:59 PM   Clinical Narrative:  RNCM consulted regarding transportation home.  RNCM contacted [place] to arrange transportation to home address.  ED Mini Assessment: What brought you to the Emergency Department? : chest pain  Barriers to Discharge: ED Transportation, Barriers Resolved  Barrier interventions: [place] transportation  Means of departure: Hospital Transport  Interventions which prevented an admission or readmission: Transportation Screening    Patient Contact and Communications        ,                 Admission diagnosis:  chest pain Patient Active Problem List   Diagnosis Date Noted  . Lower extremity edema   . 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 Acute respiratory failure ([place]) 07/27/2019  . Breast tenderness in male 05/31/2019  . Chronic pain 05/31/2019  . AKI (acute kidney injury) ([place]) 05/31/2019  . Pain due to onychomycosis of toenails of both feet 05/16/2019  . Chronic knee pain 04/16/2019  . Diabetes ([place]) 04/16/2019  . Esophageal reflux 02/06/2019  . Heartburn 02/06/2019  . Chronic skin ulcer of lower leg ([place]) 12/21/2018  . S/P drug eluting coronary stent placement   . Coronary artery disease involving native coronary artery of native heart with unstable angina pectoris ([place]) 12/20/2018  . Unstable angina ([place])   . 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 Acute on chronic diastolic heart failure ([place])   . Shortness of breath   . Precordial chest pain   . Idiopathic peripheral neuropathy   . Palpitations   . Chronic diastolic heart failure ([place])   . Abnormal ankle brachial index (ABI)   . Chest pain, rule out acute myocardial infarction 09/26/2018  . Dyspepsia   . Morbid obesity ([place])    . OSA on CPAP   . Chest pain 05/30/2018  . CAD (coronary artery disease) 03/30/2018  . Elevated troponin 02/03/2018  . Positive urine drug screen 02/03/2018  . Tobacco abuse 02/03/2018  . Hyperlipidemia 02/03/2018  . Acute respiratory failure with hypoxia ([place]) 02/02/2018  . Acute congestive heart failure ([place])   . Atypical chest pain 12/27/2015  . Essential hypertension   . Neuropathy    PCP:  [name], MD Pharmacy:   [place], [place] - [address] [place] [address] Phone: [phone] Fax: [phone]

## 2019-08-15 NOTE — ED Notes (Signed)
Pt is sitting outside 

## 2019-08-15 NOTE — ED Notes (Signed)
Patient verbalizes understanding of discharge instructions. Opportunity for questioning and answers were provided. Pt discharged from ED. 

## 2019-08-15 NOTE — Discharge Instructions (Signed)
Go to your primary care provider tomorrow, as scheduled.  You were initially hypotensive when he first came to the ED (approximately 12 hours prior to my examination) and electronically discussed your blood pressure medications with your primary care provider.  I would like for you to call the office of Dr. Haroldine Laws to schedule an appointment for further evaluation of your palpitations.    Return to the ED or seek immediate medical attention if you experience any new or worsening symptoms.
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
## 2019-08-16 ENCOUNTER — Ambulatory Visit: Payer: Medicaid Other | Attending: Critical Care Medicine

## 2019-08-16 ENCOUNTER — Other Ambulatory Visit: Payer: Self-pay

## 2019-08-16 ENCOUNTER — Ambulatory Visit (INDEPENDENT_AMBULATORY_CARE_PROVIDER_SITE_OTHER): Payer: Medicaid Other | Admitting: Family Medicine

## 2019-08-16 DIAGNOSIS — Z23 Encounter for immunization: Secondary | ICD-10-CM

## 2019-08-16 DIAGNOSIS — I5032 Chronic diastolic (congestive) heart failure: Secondary | ICD-10-CM | POA: Diagnosis not present

## 2019-08-16 NOTE — Assessment & Plan Note (Addendum)
Patient is on enalapril 20 mg at bedtime, Imdur 30 mg daily, Lasix 40 mg daily.  Patient reports that he takes his medications as prescribed.  Blood pressure on arrival was 100/60.  This is a manual blood pressure.  Patient reports that he intermittently feels lightheaded when standing.  He also notes heart palpitations intermittently.  Orthostatic vital signs on exam today are negative for orthostasis.  Per the emergency department work-up note they recommended consideration of Holter monitor given patient's frequent emergency department visits for heart palpitations. -Recommend patient continue home medications at this time -Recommend patient schedule appointment with cardiologist to determine if medication changes are necessary -Strict emergency department precautions given

## 2019-08-16 NOTE — Progress Notes (Signed)
   Covid-19 Vaccination Clinic  Name:  Russell Collier    MRN: [id number] DOB: 1960-05-18  08/16/2019  Russell Collier was observed post Covid-19 immunization for 15 min without incident. He was provided with Vaccine Information Sheet and instruction to access the V-Safe system.   Russell Collier was instructed to call 911 with any severe reactions post vaccine: Marland Kitchen Difficulty breathing  . Swelling of face and throat  . A fast heartbeat  . A bad rash all over body  . Dizziness and weakness   Immunizations Administered    Name Date Dose VIS Date Route   Moderna COVID-19 Vaccine 08/16/2019  9:24 AM 0.5 mL 01/2019 Intramuscular   Manufacturer: Moderna   Lot: [id number]   [place]: 38887-579-72

## 2019-08-16 NOTE — Progress Notes (Signed)
    SUBJECTIVE:   CHIEF COMPLAINT / HPI:   Patient is here to follow-up after hospital visit. Patient reports he has had multiple episodes of heart palpitations with low blood pressures and dizziness upon standing over the last few weeks.  He has been seen and evaluated in the emergency department several times and they have been unable to identify any source.  Of note with chart review I noticed that the previous ED provider recommended him see Dr. Tasia Catchings, his cardiologist, for recommendations regarding Holter monitor and possible medication management.  Patient reports he is still taking his blood pressure medications as prescribed.  Denies any chest pain today.  Does report shortness of breath and says that he just feels bad but does report that he had his second dose of the Pfizer Covid vaccine this morning.  OBJECTIVE:   BP 132/72   Pulse 77   Ht 5\' 10"  (1.778 m)   Wt (!) 324 lb (147 kg)   SpO2 98%   BMI 46.49 kg/m   Orthostatic VS for the past 72 hrs (Last 3 readings):  Orthostatic BP Orthostatic Pulse  08/16/19 1527 133/74 94  08/16/19 1525 120/64 76   General: Patient appears like he does not feel well but is in no acute distress Cardio: Patient has a regular rate and rhythm.  Denies any chest pain no tenderness to palpation along chest wall Respiratory: Lungs have occasional expiratory wheeze but no crackles noted at lung bases.  Normal work of breathing Abdomen: Soft, nontender, positive bowel sounds Extremities: Patient has +1 pitting edema in his lower extremities bilaterally.  Edema extends to almost to his knees.    ASSESSMENT/PLAN:   Chronic diastolic heart failure ([place]) Patient is on enalapril 20 mg at bedtime, Imdur 30 mg daily, Lasix 40 mg daily.  Patient reports that he takes his medications as prescribed.  Blood pressure on arrival was 100/60.  This is a manual blood pressure.  Patient reports that he intermittently feels lightheaded when standing.  He also  notes heart palpitations intermittently.  Orthostatic vital signs on exam today are negative for orthostasis.  Per the emergency department work-up note they recommended consideration of Holter monitor given patient's frequent emergency department visits for heart palpitations. -Recommend patient continue home medications at this time -Recommend patient schedule appointment with cardiologist to determine if medication changes are necessary -Strict emergency department precautions given     [name], MD [place]

## 2019-08-16 NOTE — Patient Instructions (Signed)
It was good to see you today.  I am sorry you are feeling so poor after receiving your second dose of the Covid vaccine.  The symptoms might last for 2 to 3 days.  They may resolve more quickly.  I would like for you to rest over the next few days and take Tylenol if you have a fever.  Regarding your low blood pressures and dizziness upon standing we collected orthostatic vital signs this visit to check for drops in blood pressure when you stand and they were negative.  Your blood pressures were on the lower end of normal when you got here.  I would recommend you make an appointment with Dr. Haroldine Laws to discuss medications as well as possible Holter monitor per the ED's recommendations.  If you have any chest pain or shortness of breath please do not hesitate to seek medical attention.  I have you have a wonderful afternoon and get some rest!

## 2019-08-17 DIAGNOSIS — M1711 Unilateral primary osteoarthritis, right knee: Secondary | ICD-10-CM | POA: Diagnosis not present

## 2019-08-20 ENCOUNTER — Telehealth: Payer: Self-pay | Admitting: Pharmacist

## 2019-08-20 ENCOUNTER — Other Ambulatory Visit (HOSPITAL_COMMUNITY): Payer: Self-pay

## 2019-08-20 NOTE — Telephone Encounter (Signed)
Telephone contact to assess and support tobacco cessation effort.   Patient has made no progress with tobacco cessation related to pain, multiple health complaints and frustration of dealing with health issues.   He continues to talk change and expresses interest in being a non-smoker.   We discussed advantages of smoking including oxygen carrying leading to less chest pain AND improved wound healing if he has surgery on his back.   He verbalized these were "good points" and continued to talk as if he were seriously considering another quit attempt.   I will plan to follow-up in 1 month to assess progress.

## 2019-08-20 NOTE — Telephone Encounter (Signed)
-----   Message from Leavy Cella, [place] sent at 08/02/2019  5:49 PM EDT ----- Regarding: Tobacco Cessation

## 2019-08-20 NOTE — Progress Notes (Signed)
Paramedicine Encounter    Patient ID: Russell Collier, male    DOB: May 15, 1960, 58 y.o.   MRN: [id number]   Patient Care Team: [name], MD as PCP - General (Family Medicine) [name], MD as PCP - Cardiology (Cardiology) Michael Boston, MD as Consulting Physician (General Surgery) Wilford Corner, MD as Consulting Physician (Gastroenterology) Jorge Ny, LCSW as Social Worker (Licensed Clinical Social Worker) Alda Berthold, DO as Consulting Physician (Neurology)  Patient Active Problem List   Diagnosis Date Noted  . Lower extremity edema   . 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 Acute respiratory failure ([place]) 07/27/2019  . Breast tenderness in male 05/31/2019  . Chronic pain 05/31/2019  . AKI (acute kidney injury) ([place]) 05/31/2019  . Pain due to onychomycosis of toenails of both feet 05/16/2019  . Chronic knee pain 04/16/2019  . Diabetes ([place]) 04/16/2019  . Esophageal reflux 02/06/2019  . Heartburn 02/06/2019  . Chronic skin ulcer of lower leg ([place]) 12/21/2018  . S/P drug eluting coronary stent placement   . Coronary artery disease involving native coronary artery of native heart with unstable angina pectoris ([place]) 12/20/2018  . Unstable angina ([place])   . 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 Acute on chronic diastolic heart failure ([place])   . Shortness of breath   . Precordial chest pain   . Idiopathic peripheral neuropathy   . Palpitations   . Chronic diastolic heart failure ([place])   . Abnormal ankle brachial index (ABI)   . Chest pain, rule out acute myocardial infarction 09/26/2018  . Dyspepsia   . Morbid obesity ([place])   . OSA on CPAP   . Chest pain 05/30/2018  . CAD (coronary artery disease) 03/30/2018  . Elevated troponin 02/03/2018  . Positive urine drug screen 02/03/2018  . Tobacco abuse 02/03/2018  . Hyperlipidemia 02/03/2018  . Acute respiratory failure with hypoxia ([place]) 02/02/2018  . Acute congestive heart failure ([place])   . Atypical chest pain  12/27/2015  . Essential hypertension   . Neuropathy     Current Outpatient Medications:  .  albuterol (PROVENTIL) (2.5 MG/3ML) 0.083% nebulizer solution, Take 2.5 mg by nebulization every 6 (six) hours as needed for wheezing or shortness of breath., Disp: , Rfl:  .  albuterol (VENTOLIN HFA) 108 (90 Base) MCG/ACT inhaler, Inhale 2 puffs into the lungs every 6 (six) hours as needed for wheezing or shortness of breath., Disp: , Rfl:  .  APIXABAN (ELIQUIS) VTE STARTER PACK (10MG  AND 5MG ), Take as directed on package: start with two-5mg  tablets twice daily for 7 days. On day 8, switch to one-5mg  tablet twice daily. (Patient taking differently: Take 5-10 mg by mouth 2 (two) times daily. Take as directed on package: start with two-5mg  tablets twice daily for 7 days. On day 8, switch to one-5mg  tablet twice daily.), Disp: 1 each, Rfl: 0 .  atorvastatin (LIPITOR) 80 MG tablet, Take 1 tablet (80 mg total) by mouth daily., Disp: 90 tablet, Rfl: 3 .  clopidogrel (PLAVIX) 75 MG tablet, Take 1 tablet (75 mg total) by mouth daily with breakfast., Disp: 90 tablet, Rfl: 3 .  dapagliflozin propanediol (FARXIGA) 10 MG TABS tablet, Take 10 mg by mouth daily before breakfast., Disp: 30 tablet, Rfl: 6 .  enalapril (VASOTEC) 20 MG tablet, Take 20 mg by mouth at bedtime., Disp: , Rfl:  .  fluticasone (FLONASE) 50 MCG/ACT nasal spray, PLACE 1 SPRAY INTO BOTH NOSTRILS DAILY., Disp: 16 g, Rfl: 11 .  furosemide (LASIX) 40 MG tablet,  Take 1 tablet (40 mg total) by mouth 2 (two) times daily., Disp: 180 tablet, Rfl: 3 .  isosorbide mononitrate (IMDUR) 30 MG 24 hr tablet, Take 1 tablet (30 mg total) by mouth daily., Disp: 90 tablet, Rfl: 3 .  lidocaine (LIDODERM) 5 %, Place 1 patch onto the skin daily. Remove & Discard patch within 12 hours or as directed by MD, Disp: 5 patch, Rfl: 0 .  Multiple Vitamin (MULTIVITAMIN WITH MINERALS) TABS tablet, Take 1 tablet by mouth daily., Disp: , Rfl:  .  pantoprazole (PROTONIX) 40 MG tablet,  Take 1 tablet (40 mg total) by mouth daily., Disp: 90 tablet, Rfl: 0 .  potassium chloride SA (KLOR-CON) 20 MEQ tablet, TAKE 1 TABLET(20 MEQ) BY MOUTH DAILY (Patient taking differently: Take 20 mEq by mouth daily. TAKE 1 TABLET(20 MEQ) BY MOUTH DAILY), Disp: 90 tablet, Rfl: 3 .  pregabalin (LYRICA) 100 MG capsule, Take 1 tablet three times daily (Patient taking differently: Take 100 mg by mouth 3 (three) times daily. Take 1 tablet three times daily), Disp: 270 capsule, Rfl: 3 .  Tafamidis (VYNDAMAX) 61 MG CAPS, Take 61 mg by mouth daily., Disp: , Rfl:  .  umeclidinium-vilanterol (ANORO ELLIPTA) 62.5-25 MCG/INH AEPB, Inhale 1 puff into the lungs daily., Disp: 60 each, Rfl: 2 .  valACYclovir (VALTREX) 500 MG tablet, Take 1 tablet (500 mg total) by mouth daily., Disp: 90 tablet, Rfl: 3 Allergies  Allergen Reactions  . Varenicline Other (See Comments)    Patient reports laryngospasm stopped in the ER     Social History   Socioeconomic History  . Marital status: Divorced    Spouse name: Not on file  . Number of children: 2  . Years of education: 10  . Highest education level: Not on file  Occupational History  . Occupation: not employed  Tobacco Use  . Smoking status: Current Every Day Smoker    Packs/day: 0.50    Years: 54.00    Pack years: 27.00    Types: Cigarettes, Cigars  . Smokeless tobacco: Never Used  . Tobacco comment: half pack daily  Vaping Use  . Vaping Use: Never used  Substance and Sexual Activity  . Alcohol use: Yes    Alcohol/week: 4.0 standard drinks    Types: 2 Cans of beer, 2 Shots of liquor per week  . Drug use: Not Currently    Comment: last use may last year  . Sexual activity: Yes  Other Topics Concern  . Not on file  Social History Narrative   Right handed   Two story home   No caffeine   Social Determinants of Health   Financial Resource Strain: Low Risk   . Difficulty of Paying Living Expenses: Not very hard  Food Insecurity: No Food Insecurity   . Worried About Charity fundraiser in the Last Year: Never true  . Ran Out of Food in the Last Year: Never true  Transportation Needs: No Transportation Needs  . Lack of Transportation (Medical): No  . Lack of Transportation (Non-Medical): No  Physical Activity: Insufficiently Active  . Days of Exercise per Week: 2 days  . Minutes of Exercise per Session: 20 min  Stress: No Stress Concern Present  . Feeling of Stress : Not at all  Social Connections: Moderately Isolated  . Frequency of Communication with Friends and Family: More than three times a week  . Frequency of Social Gatherings with Friends and Family: Twice a week  . Attends Religious Services: 1  to 4 times per year  . Active Member of Clubs or Organizations: No  . Attends Archivist Meetings: Never  . Marital Status: Divorced  Human resources officer Violence: Not At Risk  . Fear of Current or Ex-Partner: No  . Emotionally Abused: No  . Physically Abused: No  . Sexually Abused: No    Physical Exam Vitals reviewed.  Constitutional:      Appearance: He is normal weight.  HENT:     Head: Normocephalic.     Nose: Nose normal.     Mouth/Throat:     Mouth: Mucous membranes are moist.  Eyes:     Pupils: Pupils are equal, round, and reactive to light.  Cardiovascular:     Rate and Rhythm: Tachycardia present.     Pulses: Normal pulses.     Heart sounds: Normal heart sounds.  Pulmonary:     Effort: Pulmonary effort is normal.     Breath sounds: Normal breath sounds.  Abdominal:     Palpations: Abdomen is soft.  Musculoskeletal:        General: Normal range of motion.     Cervical back: Normal range of motion.     Right lower leg: No edema.     Left lower leg: No edema.     Comments: Left lower back pain.   Skin:    General: Skin is warm and dry.     Capillary Refill: Capillary refill takes less than 2 seconds.  Neurological:     Mental Status: He is alert. Mental status is at baseline.  Psychiatric:         Mood and Affect: Mood normal.     Arrived for home visit for Mr. Loy who was seated at his kitchen table alert and oriented complaining of left lower back pain. Mr. Radi has chronic back pain and is being followed by ortho for same. Vitals were obtained. Mr. Emberton reports he has had some increased fatigue over the last few weeks as well as some dizzy spells. He had a recent visit with PCP and Dr. Caron Presume requested patient be seen by Dr. Letha Cape office for soft BP's, fatigue and dizziness. I contacted HF clinic and RN reports she will discuss with PA and let me know if we should schedule appointment or adjust medications. I will continue to assist with same. Medications were reviewed and confirmed. Pill box filled accordingly. I advised Mr. Armwood I would be reaching out once I hear back from HF clinic PA or RN. Home visit complete. I will see patient in one week.  Refills:  Eliquis Pregabalin Pantoprazole      Future Appointments  Date Time Provider [place]  08/29/2019  3:00 PM Carney Living, PT [facility]  09/05/2019  8:45 AM Gardiner Barefoot, DPM [facility]     ACTION: Home visit completed Next visit planned for one week

## 2019-08-21 ENCOUNTER — Ambulatory Visit: Payer: Medicaid Other | Admitting: Podiatry

## 2019-08-21 ENCOUNTER — Other Ambulatory Visit (HOSPITAL_COMMUNITY): Payer: Self-pay | Admitting: Cardiology

## 2019-08-21 MED ORDER — APIXABAN 5 MG PO TABS
5.0000 mg | ORAL_TABLET | Freq: Two times a day (BID) | ORAL | 11 refills | Status: DC
Start: 2019-08-21 — End: 2019-11-19

## 2019-08-27 ENCOUNTER — Other Ambulatory Visit (HOSPITAL_COMMUNITY): Payer: Self-pay

## 2019-08-27 NOTE — Progress Notes (Signed)
Paramedicine Encounter    Patient ID: Russell Collier, male    DOB: 11/23/1960, 59 y.o.   MRN: [id number]   Patient Care Team: [name], MD as PCP - General (Family Medicine) [name], MD as PCP - Cardiology (Cardiology) Michael Boston, MD as Consulting Physician (General Surgery) Wilford Corner, MD as Consulting Physician (Gastroenterology) Jorge Ny, LCSW as Social Worker (Licensed Clinical Social Worker) Alda Berthold, DO as Consulting Physician (Neurology)  Patient Active Problem List   Diagnosis Date Noted  . Lower extremity edema   . 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 Acute respiratory failure ([place]) 07/27/2019  . Breast tenderness in male 05/31/2019  . Chronic pain 05/31/2019  . AKI (acute kidney injury) ([place]) 05/31/2019  . Pain due to onychomycosis of toenails of both feet 05/16/2019  . Chronic knee pain 04/16/2019  . Diabetes ([place]) 04/16/2019  . Esophageal reflux 02/06/2019  . Heartburn 02/06/2019  . Chronic skin ulcer of lower leg ([place]) 12/21/2018  . S/P drug eluting coronary stent placement   . Coronary artery disease involving native coronary artery of native heart with unstable angina pectoris ([place]) 12/20/2018  . Unstable angina ([place])   . 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 Acute on chronic diastolic heart failure ([place])   . Shortness of breath   . Precordial chest pain   . Idiopathic peripheral neuropathy   . Palpitations   . Chronic diastolic heart failure ([place])   . Abnormal ankle brachial index (ABI)   . Chest pain, rule out acute myocardial infarction 09/26/2018  . Dyspepsia   . Morbid obesity ([place])   . OSA on CPAP   . Chest pain 05/30/2018  . CAD (coronary artery disease) 03/30/2018  . Elevated troponin 02/03/2018  . Positive urine drug screen 02/03/2018  . Tobacco abuse 02/03/2018  . Hyperlipidemia 02/03/2018  . Acute respiratory failure with hypoxia ([place]) 02/02/2018  . Acute congestive heart failure ([place])   . Atypical chest pain  12/27/2015  . Essential hypertension   . Neuropathy     Current Outpatient Medications:  .  albuterol (PROVENTIL) (2.5 MG/3ML) 0.083% nebulizer solution, Take 2.5 mg by nebulization every 6 (six) hours as needed for wheezing or shortness of breath., Disp: , Rfl:  .  albuterol (VENTOLIN HFA) 108 (90 Base) MCG/ACT inhaler, Inhale 2 puffs into the lungs every 6 (six) hours as needed for wheezing or shortness of breath., Disp: , Rfl:  .  apixaban (ELIQUIS) 5 MG TABS tablet, Take 1 tablet (5 mg total) by mouth 2 (two) times daily., Disp: 60 tablet, Rfl: 11 .  atorvastatin (LIPITOR) 80 MG tablet, Take 1 tablet (80 mg total) by mouth daily., Disp: 90 tablet, Rfl: 3 .  clopidogrel (PLAVIX) 75 MG tablet, Take 1 tablet (75 mg total) by mouth daily with breakfast., Disp: 90 tablet, Rfl: 3 .  dapagliflozin propanediol (FARXIGA) 10 MG TABS tablet, Take 10 mg by mouth daily before breakfast., Disp: 30 tablet, Rfl: 6 .  enalapril (VASOTEC) 20 MG tablet, Take 20 mg by mouth at bedtime., Disp: , Rfl:  .  fluticasone (FLONASE) 50 MCG/ACT nasal spray, PLACE 1 SPRAY INTO BOTH NOSTRILS DAILY., Disp: 16 g, Rfl: 11 .  furosemide (LASIX) 40 MG tablet, Take 1 tablet (40 mg total) by mouth 2 (two) times daily., Disp: 180 tablet, Rfl: 3 .  isosorbide mononitrate (IMDUR) 30 MG 24 hr tablet, Take 1 tablet (30 mg total) by mouth daily., Disp: 90 tablet, Rfl: 3 .  lidocaine (LIDODERM) 5 %, Place  1 patch onto the skin daily. Remove & Discard patch within 12 hours or as directed by MD, Disp: 5 patch, Rfl: 0 .  Multiple Vitamin (MULTIVITAMIN WITH MINERALS) TABS tablet, Take 1 tablet by mouth daily., Disp: , Rfl:  .  pantoprazole (PROTONIX) 40 MG tablet, Take 1 tablet (40 mg total) by mouth daily., Disp: 90 tablet, Rfl: 0 .  potassium chloride SA (KLOR-CON) 20 MEQ tablet, TAKE 1 TABLET(20 MEQ) BY MOUTH DAILY (Patient taking differently: Take 20 mEq by mouth daily. TAKE 1 TABLET(20 MEQ) BY MOUTH DAILY), Disp: 90 tablet, Rfl: 3 .   pregabalin (LYRICA) 100 MG capsule, Take 1 tablet three times daily (Patient taking differently: Take 100 mg by mouth 3 (three) times daily. Take 1 tablet three times daily), Disp: 270 capsule, Rfl: 3 .  Tafamidis (VYNDAMAX) 61 MG CAPS, Take 61 mg by mouth daily., Disp: , Rfl:  .  umeclidinium-vilanterol (ANORO ELLIPTA) 62.5-25 MCG/INH AEPB, Inhale 1 puff into the lungs daily., Disp: 60 each, Rfl: 2 .  valACYclovir (VALTREX) 500 MG tablet, Take 1 tablet (500 mg total) by mouth daily., Disp: 90 tablet, Rfl: 3 Allergies  Allergen Reactions  . Varenicline Other (See Comments)    Patient reports laryngospasm stopped in the ER     Social History   Socioeconomic History  . Marital status: Divorced    Spouse name: Not on file  . Number of children: 2  . Years of education: 10  . Highest education level: Not on file  Occupational History  . Occupation: not employed  Tobacco Use  . Smoking status: Current Every Day Smoker    Packs/day: 0.50    Years: 54.00    Pack years: 27.00    Types: Cigarettes, Cigars  . Smokeless tobacco: Never Used  . Tobacco comment: half pack daily  Vaping Use  . Vaping Use: Never used  Substance and Sexual Activity  . Alcohol use: Yes    Alcohol/week: 4.0 standard drinks    Types: 2 Cans of beer, 2 Shots of liquor per week  . Drug use: Not Currently    Comment: last use may last year  . Sexual activity: Yes  Other Topics Concern  . Not on file  Social History Narrative   Right handed   Two story home   No caffeine   Social Determinants of Health   Financial Resource Strain: Low Risk   . Difficulty of Paying Living Expenses: Not very hard  Food Insecurity: No Food Insecurity  . Worried About Charity fundraiser in the Last Year: Never true  . Ran Out of Food in the Last Year: Never true  Transportation Needs: No Transportation Needs  . Lack of Transportation (Medical): No  . Lack of Transportation (Non-Medical): No  Physical Activity:  Insufficiently Active  . Days of Exercise per Week: 2 days  . Minutes of Exercise per Session: 20 min  Stress: No Stress Concern Present  . Feeling of Stress : Not at all  Social Connections: Moderately Isolated  . Frequency of Communication with Friends and Family: More than three times a week  . Frequency of Social Gatherings with Friends and Family: Twice a week  . Attends Religious Services: 1 to 4 times per year  . Active Member of Clubs or Organizations: No  . Attends Archivist Meetings: Never  . Marital Status: Divorced  Human resources officer Violence: Not At Risk  . Fear of Current or Ex-Partner: No  . Emotionally Abused: No  .  Physically Abused: No  . Sexually Abused: No    Physical Exam Vitals reviewed.  HENT:     Head: Normocephalic.     Nose: Nose normal.     Mouth/Throat:     Mouth: Mucous membranes are moist.  Eyes:     Pupils: Pupils are equal, round, and reactive to light.  Cardiovascular:     Rate and Rhythm: Normal rate and regular rhythm.     Pulses: Normal pulses.     Heart sounds: Normal heart sounds.  Pulmonary:     Effort: Pulmonary effort is normal.     Breath sounds: Normal breath sounds.  Abdominal:     General: Abdomen is flat.     Palpations: Abdomen is soft.  Musculoskeletal:        General: Normal range of motion.     Cervical back: Normal range of motion.     Right lower leg: Edema present.     Left lower leg: Edema present.  Skin:    General: Skin is warm and dry.     Capillary Refill: Capillary refill takes less than 2 seconds.  Neurological:     Mental Status: He is alert. Mental status is at baseline.  Psychiatric:        Mood and Affect: Mood normal.    Arrived for home visit for Russell Collier who was alert and oriented seated at his kitchen table reporting he has been feeling okay with some back pain aggravated from a fall in the front yard yesterday. He reports no injuries from the fall just increased back pain from his chronic  back pain he always has. Russell Collier denied any loss of consciousness with the fall. Assessment  revealed no injuries. Vitals obtained and are as noted. Russell Collier had an increase with his weight in which he relates to drinking ensure's along with eating normal daily meals. I explained to Russell Collier that Ensure's are used to be meal supplements or replacements not used with regular food intake in his case. He agreed and understood. Medications were reviewed and confirmed. Two pill boxes were filled accordingly. Russell Collier was made aware of my absence next week and understood and agreed. He was educated on his pill boxes and how to skip from pill box one to pill box two. Russell Collier was told to call clinic if need arose. He agreed. Home visit complete.   Refills: Russell Collier     Future Appointments  Date Time Provider [place]  08/29/2019  3:00 PM Carney Living, PT [facility]  09/05/2019  8:45 AM Gardiner Barefoot, DPM [facility]     ACTION: Home visit completed Next visit planned for one week

## 2019-08-29 ENCOUNTER — Other Ambulatory Visit: Payer: Self-pay

## 2019-08-29 ENCOUNTER — Ambulatory Visit: Payer: Medicaid Other | Attending: Family Medicine | Admitting: Physical Therapy

## 2019-08-29 ENCOUNTER — Encounter: Payer: Self-pay | Admitting: Physical Therapy

## 2019-08-29 DIAGNOSIS — G8929 Other chronic pain: Secondary | ICD-10-CM | POA: Diagnosis present

## 2019-08-29 DIAGNOSIS — M545 Low back pain, unspecified: Secondary | ICD-10-CM

## 2019-08-29 DIAGNOSIS — R2689 Other abnormalities of gait and mobility: Secondary | ICD-10-CM | POA: Insufficient documentation
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
## 2019-08-30 NOTE — Patient Instructions (Signed)
Access Code: [email or web address] by: Shanon Brow CarrollExercises  Seated Hamstring Stretch - 2 x daily - 7 x weekly - 1 sets - 3 reps - 20 hold  Standing Glute Med Mobilization with Small Ball on Wall - 2 x daily - 7 x weekly - 3 sets - 3 reps - 2 min hold

## 2019-09-04 DIAGNOSIS — M25461 Effusion, right knee: Secondary | ICD-10-CM | POA: Diagnosis not present

## 2019-09-04 DIAGNOSIS — M1711 Unilateral primary osteoarthritis, right knee: Secondary | ICD-10-CM | POA: Diagnosis not present

## 2019-09-04 DIAGNOSIS — S83241A Other tear of medial meniscus, current injury, right knee, initial encounter: Secondary | ICD-10-CM | POA: Diagnosis not present

## 2019-09-04 DIAGNOSIS — R600 Localized edema: Secondary | ICD-10-CM | POA: Diagnosis not present

## 2019-09-05 ENCOUNTER — Ambulatory Visit (INDEPENDENT_AMBULATORY_CARE_PROVIDER_SITE_OTHER): Payer: Medicaid Other | Admitting: Podiatry

## 2019-09-05 ENCOUNTER — Other Ambulatory Visit: Payer: Self-pay

## 2019-09-05 ENCOUNTER — Encounter: Payer: Self-pay | Admitting: Podiatry

## 2019-09-05 DIAGNOSIS — E1151 Type 2 diabetes mellitus with diabetic peripheral angiopathy without gangrene: Secondary | ICD-10-CM | POA: Diagnosis not present

## 2019-09-05 DIAGNOSIS — M79674 Pain in right toe(s): Secondary | ICD-10-CM

## 2019-09-05 DIAGNOSIS — M79675 Pain in left toe(s): Secondary | ICD-10-CM

## 2019-09-05 DIAGNOSIS — B351 Tinea unguium: Secondary | ICD-10-CM

## 2019-09-05 NOTE — Progress Notes (Signed)
This patient presents  to my office for at risk foot care.  This patient requires this care by a professional since this patient will be at risk due to having diabetes, peripheral neuropathy and coagulation defect.  Patient is taking plavix.  This patient is unable to cut nails himself since the patient cannot reach his nails.These nails are painful walking and wearing shoes.  This patient presents for at risk foot care today. He has had nail surgery for the removal of nail plate left foot.  General Appearance  Alert, conversant and in no acute stress.  Vascular  Dorsalis pedis and posterior tibial  pulses are weakly  palpable  bilaterally.  Capillary return is within normal limits  bilaterally. Temperature is within normal limits  bilaterally.  Neurologic  Senn-Weinstein monofilament wire test within normal limits  bilaterally. Muscle power within normal limits bilaterally.  Nails Thick disfigured discolored nails with subungual debris  from hallux to fifth toes right and 2-5 left.  Healing nail bed left foot.. No evidence of bacterial infection or drainage bilaterally.  Orthopedic  No limitations of motion  feet .  No crepitus or effusions noted.  No bony pathology or digital deformities noted.  HAV  Left.  Pes planus.  Skin  normotropic skin with no porokeratosis noted bilaterally.  No signs of infections or ulcers noted.     Onychomycosis  Pain in right toes  Pain in left toes  Consent was obtained for treatment procedures.   Mechanical debridement of nails bilaterally performed with a nail nipper.  Filed with dremel without incident.    Return office visit  3 months                    Told patient to return for periodic foot care and evaluation due to potential at risk complications.   Zell Doucette DPM  

## 2019-09-10 ENCOUNTER — Other Ambulatory Visit (HOSPITAL_COMMUNITY): Payer: Self-pay

## 2019-09-10 NOTE — Progress Notes (Signed)
Paramedicine Encounter    Patient ID: Russell Collier, male    DOB: 1960-03-12, 59 y.o.   MRN: [id number]   Patient Care Team: [name], MD as PCP - General (Family Medicine) [name], MD as PCP - Cardiology (Cardiology) Michael Boston, MD as Consulting Physician (General Surgery) Wilford Corner, MD as Consulting Physician (Gastroenterology) Jorge Ny, LCSW as Social Worker (Licensed Clinical Social Worker) Alda Berthold, DO as Consulting Physician (Neurology)  Patient Active Problem List   Diagnosis Date Noted  . Lower extremity edema   . 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 Acute respiratory failure ([place]) 07/27/2019  . Breast tenderness in male 05/31/2019  . Chronic pain 05/31/2019  . AKI (acute kidney injury) ([place]) 05/31/2019  . Pain due to onychomycosis of toenails of both feet 05/16/2019  . Chronic knee pain 04/16/2019  . Diabetes ([place]) 04/16/2019  . Esophageal reflux 02/06/2019  . Heartburn 02/06/2019  . Chronic skin ulcer of lower leg ([place]) 12/21/2018  . S/P drug eluting coronary stent placement   . Coronary artery disease involving native coronary artery of native heart with unstable angina pectoris ([place]) 12/20/2018  . Unstable angina ([place])   . 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 Acute on chronic diastolic heart failure ([place])   . Shortness of breath   . Precordial chest pain   . Idiopathic peripheral neuropathy   . Palpitations   . Chronic diastolic heart failure ([place])   . Abnormal ankle brachial index (ABI)   . Chest pain, rule out acute myocardial infarction 09/26/2018  . Dyspepsia   . Morbid obesity ([place])   . OSA on CPAP   . Chest pain 05/30/2018  . CAD (coronary artery disease) 03/30/2018  . Elevated troponin 02/03/2018  . Positive urine drug screen 02/03/2018  . Tobacco abuse 02/03/2018  . Hyperlipidemia 02/03/2018  . Acute respiratory failure with hypoxia ([place]) 02/02/2018  . Acute congestive heart failure ([place])   . Atypical chest pain  12/27/2015  . Essential hypertension   . Neuropathy     Current Outpatient Medications:  .  albuterol (PROVENTIL) (2.5 MG/3ML) 0.083% nebulizer solution, Take 2.5 mg by nebulization every 6 (six) hours as needed for wheezing or shortness of breath., Disp: , Rfl:  .  albuterol (VENTOLIN HFA) 108 (90 Base) MCG/ACT inhaler, Inhale 2 puffs into the lungs every 6 (six) hours as needed for wheezing or shortness of breath., Disp: , Rfl:  .  apixaban (ELIQUIS) 5 MG TABS tablet, Take 1 tablet (5 mg total) by mouth 2 (two) times daily., Disp: 60 tablet, Rfl: 11 .  atorvastatin (LIPITOR) 80 MG tablet, Take 1 tablet (80 mg total) by mouth daily., Disp: 90 tablet, Rfl: 3 .  baclofen (LIORESAL) 10 MG tablet, Take 5-10 mg by mouth 3 (three) times daily as needed., Disp: , Rfl:  .  clopidogrel (PLAVIX) 75 MG tablet, Take 1 tablet (75 mg total) by mouth daily with breakfast., Disp: 90 tablet, Rfl: 3 .  dapagliflozin propanediol (FARXIGA) 10 MG TABS tablet, Take 10 mg by mouth daily before breakfast., Disp: 30 tablet, Rfl: 6 .  enalapril (VASOTEC) 20 MG tablet, Take 20 mg by mouth at bedtime., Disp: , Rfl:  .  fluticasone (FLONASE) 50 MCG/ACT nasal spray, PLACE 1 SPRAY INTO BOTH NOSTRILS DAILY., Disp: 16 g, Rfl: 11 .  furosemide (LASIX) 40 MG tablet, Take 1 tablet (40 mg total) by mouth 2 (two) times daily., Disp: 180 tablet, Rfl: 3 .  isosorbide mononitrate (IMDUR) 30 MG 24 hr  tablet, Take 1 tablet (30 mg total) by mouth daily., Disp: 90 tablet, Rfl: 3 .  lidocaine (LIDODERM) 5 %, Place 1 patch onto the skin daily. Remove & Discard patch within 12 hours or as directed by MD, Disp: 5 patch, Rfl: 0 .  Multiple Vitamin (MULTIVITAMIN WITH MINERALS) TABS tablet, Take 1 tablet by mouth daily., Disp: , Rfl:  .  pantoprazole (PROTONIX) 40 MG tablet, Take 1 tablet (40 mg total) by mouth daily., Disp: 90 tablet, Rfl: 0 .  potassium chloride SA (KLOR-CON) 20 MEQ tablet, TAKE 1 TABLET(20 MEQ) BY MOUTH DAILY (Patient taking  differently: Take 20 mEq by mouth daily. TAKE 1 TABLET(20 MEQ) BY MOUTH DAILY), Disp: 90 tablet, Rfl: 3 .  pregabalin (LYRICA) 100 MG capsule, Take 1 tablet three times daily (Patient taking differently: Take 100 mg by mouth 3 (three) times daily. Take 1 tablet three times daily), Disp: 270 capsule, Rfl: 3 .  Tafamidis (VYNDAMAX) 61 MG CAPS, Take 61 mg by mouth daily., Disp: , Rfl:  .  umeclidinium-vilanterol (ANORO ELLIPTA) 62.5-25 MCG/INH AEPB, Inhale 1 puff into the lungs daily., Disp: 60 each, Rfl: 2 .  valACYclovir (VALTREX) 500 MG tablet, Take 1 tablet (500 mg total) by mouth daily., Disp: 90 tablet, Rfl: 3 Allergies  Allergen Reactions  . Varenicline Other (See Comments)    Patient reports laryngospasm stopped in the ER     Social History   Socioeconomic History  . Marital status: Divorced    Spouse name: Not on file  . Number of children: 2  . Years of education: 10  . Highest education level: Not on file  Occupational History  . Occupation: not employed  Tobacco Use  . Smoking status: Current Every Day Smoker    Packs/day: 0.50    Years: 54.00    Pack years: 27.00    Types: Cigarettes, Cigars  . Smokeless tobacco: Never Used  . Tobacco comment: half pack daily  Vaping Use  . Vaping Use: Never used  Substance and Sexual Activity  . Alcohol use: Yes    Alcohol/week: 4.0 standard drinks    Types: 2 Cans of beer, 2 Shots of liquor per week  . Drug use: Not Currently    Comment: last use may last year  . Sexual activity: Yes  Other Topics Concern  . Not on file  Social History Narrative   Right handed   Two story home   No caffeine   Social Determinants of Health   Financial Resource Strain: Low Risk   . Difficulty of Paying Living Expenses: Not very hard  Food Insecurity: No Food Insecurity  . Worried About Charity fundraiser in the Last Year: Never true  . Ran Out of Food in the Last Year: Never true  Transportation Needs: No Transportation Needs  . Lack  of Transportation (Medical): No  . Lack of Transportation (Non-Medical): No  Physical Activity: Insufficiently Active  . Days of Exercise per Week: 2 days  . Minutes of Exercise per Session: 20 min  Stress: No Stress Concern Present  . Feeling of Stress : Not at all  Social Connections: Moderately Isolated  . Frequency of Communication with Friends and Family: More than three times a week  . Frequency of Social Gatherings with Friends and Family: Twice a week  . Attends Religious Services: 1 to 4 times per year  . Active Member of Clubs or Organizations: No  . Attends Archivist Meetings: Never  . Marital Status:  Divorced  Intimate Partner Violence: Not At Risk  . Fear of Current or Ex-Partner: No  . Emotionally Abused: No  . Physically Abused: No  . Sexually Abused: No    Arrived for home visit for Rishaan who was alert and oriented ambulating around his home with no complaints today. Daion states he has continuous pain from his knees and back but is actively in therapy and seeing an orthopedic for same. Vitals were obtained. Medications were reviewed. Jakolby reports frustration with not receiving 90 day supplies in medications as [facility] reported they would do. He requests to be transferred back to [facility].  I will work on same. Pill box filled accordingly. Maximilien agreed to visit in one week. Home visit complete.   Refills: Potassium Accucheck Fast Click Lancets Accucheck Test Strips     Future Appointments  Date Time Provider [place]  09/12/2019  3:45 PM Hosie Spangle [facility]  12/11/2019  3:30 PM Gardiner Barefoot, DPM [facility]     ACTION: Home visit completed Next visit planned for one week

## 2019-09-11 MED FILL — VYNDAMAX 61 MG CAPS: 61 | 15 days supply | Qty: 15 | Fill #9

## 2019-09-12 ENCOUNTER — Ambulatory Visit: Payer: Medicaid Other | Admitting: Physical Therapy

## 2019-09-12 ENCOUNTER — Encounter: Payer: Self-pay | Admitting: Physical Therapy

## 2019-09-12 ENCOUNTER — Other Ambulatory Visit: Payer: Self-pay

## 2019-09-12 DIAGNOSIS — M545 Low back pain, unspecified: Secondary | ICD-10-CM

## 2019-09-12 DIAGNOSIS — R2689 Other abnormalities of gait and mobility: Secondary | ICD-10-CM
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
## 2019-09-17 ENCOUNTER — Other Ambulatory Visit (HOSPITAL_COMMUNITY): Payer: Self-pay

## 2019-09-17 NOTE — Progress Notes (Signed)
Paramedicine Encounter    Patient ID: Russell Collier, male    DOB: 1960/09/28, 59 y.o.   MRN: [id number]   Patient Care Team: [name], MD as PCP - General (Family Medicine) [name], MD as PCP - Cardiology (Cardiology) Michael Boston, MD as Consulting Physician (General Surgery) Wilford Corner, MD as Consulting Physician (Gastroenterology) Jorge Ny, LCSW as Social Worker (Licensed Clinical Social Worker) Alda Berthold, DO as Consulting Physician (Neurology)  Patient Active Problem List   Diagnosis Date Noted  . Lower extremity edema   . 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 Acute respiratory failure ([place]) 07/27/2019  . Breast tenderness in male 05/31/2019  . Chronic pain 05/31/2019  . AKI (acute kidney injury) ([place]) 05/31/2019  . Pain due to onychomycosis of toenails of both feet 05/16/2019  . Chronic knee pain 04/16/2019  . Diabetes ([place]) 04/16/2019  . Esophageal reflux 02/06/2019  . Heartburn 02/06/2019  . Chronic skin ulcer of lower leg ([place]) 12/21/2018  . S/P drug eluting coronary stent placement   . Coronary artery disease involving native coronary artery of native heart with unstable angina pectoris ([place]) 12/20/2018  . Unstable angina ([place])   . 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 Acute on chronic diastolic heart failure ([place])   . Shortness of breath   . Precordial chest pain   . Idiopathic peripheral neuropathy   . Palpitations   . Chronic diastolic heart failure ([place])   . Abnormal ankle brachial index (ABI)   . Chest pain, rule out acute myocardial infarction 09/26/2018  . Dyspepsia   . Morbid obesity ([place])   . OSA on CPAP   . Chest pain 05/30/2018  . CAD (coronary artery disease) 03/30/2018  . Elevated troponin 02/03/2018  . Positive urine drug screen 02/03/2018  . Tobacco abuse 02/03/2018  . Hyperlipidemia 02/03/2018  . Acute respiratory failure with hypoxia ([place]) 02/02/2018  . Acute congestive heart failure ([place])   . Atypical chest pain  12/27/2015  . Essential hypertension   . Neuropathy     Current Outpatient Medications:  .  albuterol (PROVENTIL) (2.5 MG/3ML) 0.083% nebulizer solution, Take 2.5 mg by nebulization every 6 (six) hours as needed for wheezing or shortness of breath., Disp: , Rfl:  .  albuterol (VENTOLIN HFA) 108 (90 Base) MCG/ACT inhaler, Inhale 2 puffs into the lungs every 6 (six) hours as needed for wheezing or shortness of breath., Disp: , Rfl:  .  apixaban (ELIQUIS) 5 MG TABS tablet, Take 1 tablet (5 mg total) by mouth 2 (two) times daily., Disp: 60 tablet, Rfl: 11 .  atorvastatin (LIPITOR) 80 MG tablet, Take 1 tablet (80 mg total) by mouth daily., Disp: 90 tablet, Rfl: 3 .  baclofen (LIORESAL) 10 MG tablet, Take 5-10 mg by mouth 3 (three) times daily as needed. (Patient not taking: Reported on 09/10/2019), Disp: , Rfl:  .  clopidogrel (PLAVIX) 75 MG tablet, Take 1 tablet (75 mg total) by mouth daily with breakfast., Disp: 90 tablet, Rfl: 3 .  dapagliflozin propanediol (FARXIGA) 10 MG TABS tablet, Take 10 mg by mouth daily before breakfast., Disp: 30 tablet, Rfl: 6 .  enalapril (VASOTEC) 20 MG tablet, Take 20 mg by mouth at bedtime., Disp: , Rfl:  .  fluticasone (FLONASE) 50 MCG/ACT nasal spray, PLACE 1 SPRAY INTO BOTH NOSTRILS DAILY., Disp: 16 g, Rfl: 11 .  furosemide (LASIX) 40 MG tablet, Take 1 tablet (40 mg total) by mouth 2 (two) times daily., Disp: 180 tablet, Rfl: 3 .  isosorbide  mononitrate (IMDUR) 30 MG 24 hr tablet, Take 1 tablet (30 mg total) by mouth daily., Disp: 90 tablet, Rfl: 3 .  lidocaine (LIDODERM) 5 %, Place 1 patch onto the skin daily. Remove & Discard patch within 12 hours or as directed by MD (Patient not taking: Reported on 09/10/2019), Disp: 5 patch, Rfl: 0 .  Multiple Vitamin (MULTIVITAMIN WITH MINERALS) TABS tablet, Take 1 tablet by mouth daily., Disp: , Rfl:  .  pantoprazole (PROTONIX) 40 MG tablet, Take 1 tablet (40 mg total) by mouth daily., Disp: 90 tablet, Rfl: 0 .  potassium  chloride SA (KLOR-CON) 20 MEQ tablet, TAKE 1 TABLET(20 MEQ) BY MOUTH DAILY (Patient taking differently: Take 20 mEq by mouth daily. TAKE 1 TABLET(20 MEQ) BY MOUTH DAILY), Disp: 90 tablet, Rfl: 3 .  pregabalin (LYRICA) 100 MG capsule, Take 1 tablet three times daily (Patient taking differently: Take 100 mg by mouth 3 (three) times daily. Take 1 tablet three times daily), Disp: 270 capsule, Rfl: 3 .  Tafamidis (VYNDAMAX) 61 MG CAPS, Take 61 mg by mouth daily., Disp: , Rfl:  .  umeclidinium-vilanterol (ANORO ELLIPTA) 62.5-25 MCG/INH AEPB, Inhale 1 puff into the lungs daily., Disp: 60 each, Rfl: 2 .  valACYclovir (VALTREX) 500 MG tablet, Take 1 tablet (500 mg total) by mouth daily., Disp: 90 tablet, Rfl: 3 Allergies  Allergen Reactions  . Varenicline Other (See Comments)    Patient reports laryngospasm stopped in the ER     Social History   Socioeconomic History  . Marital status: Divorced    Spouse name: Not on file  . Number of children: 2  . Years of education: 10  . Highest education level: Not on file  Occupational History  . Occupation: not employed  Tobacco Use  . Smoking status: Current Every Day Smoker    Packs/day: 0.50    Years: 54.00    Pack years: 27.00    Types: Cigarettes, Cigars  . Smokeless tobacco: Never Used  . Tobacco comment: half pack daily  Vaping Use  . Vaping Use: Never used  Substance and Sexual Activity  . Alcohol use: Yes    Alcohol/week: 4.0 standard drinks    Types: 2 Cans of beer, 2 Shots of liquor per week  . Drug use: Not Currently    Comment: last use may last year  . Sexual activity: Yes  Other Topics Concern  . Not on file  Social History Narrative   Right handed   Two story home   No caffeine   Social Determinants of Health   Financial Resource Strain: Low Risk   . Difficulty of Paying Living Expenses: Not very hard  Food Insecurity: No Food Insecurity  . Worried About Charity fundraiser in the Last Year: Never true  . Ran Out of  Food in the Last Year: Never true  Transportation Needs: No Transportation Needs  . Lack of Transportation (Medical): No  . Lack of Transportation (Non-Medical): No  Physical Activity: Insufficiently Active  . Days of Exercise per Week: 2 days  . Minutes of Exercise per Session: 20 min  Stress: No Stress Concern Present  . Feeling of Stress : Not at all  Social Connections: Moderately Isolated  . Frequency of Communication with Friends and Family: More than three times a week  . Frequency of Social Gatherings with Friends and Family: Twice a week  . Attends Religious Services: 1 to 4 times per year  . Active Member of Clubs or Organizations: No  .  Attends Archivist Meetings: Never  . Marital Status: Divorced  Human resources officer Violence: Not At Risk  . Fear of Current or Ex-Partner: No  . Emotionally Abused: No  . Physically Abused: No  . Sexually Abused: No    Physical Exam Vitals reviewed.  Constitutional:      Appearance: He is normal weight.  HENT:     Head: Normocephalic.     Nose: Nose normal.     Mouth/Throat:     Mouth: Mucous membranes are moist.  Eyes:     Pupils: Pupils are equal, round, and reactive to light.  Cardiovascular:     Rate and Rhythm: Normal rate and regular rhythm.     Pulses: Normal pulses.  Pulmonary:     Effort: Pulmonary effort is normal.     Breath sounds: Normal breath sounds.  Abdominal:     Palpations: Abdomen is soft.  Musculoskeletal:        General: Normal range of motion.     Cervical back: Normal range of motion.     Right lower leg: No edema.     Left lower leg: No edema.  Skin:    General: Skin is warm and dry.     Capillary Refill: Capillary refill takes less than 2 seconds.  Neurological:     Mental Status: He is alert. Mental status is at baseline.  Psychiatric:        Mood and Affect: Mood normal.     Arrived for home visit for Arnoldo who was alert and oriented seated at the kitchen table reporting he feels  okay. Koury has some chronic back and knee pain that he stated has been aggravated lately. Kiaan has been compliant with his medications for the last week. Medications were reviewed and confirmed. Pill box filled and confirmed. Vitals obtained and are as noted. Alastor lost weight over the last week but reports he has been eating poorly. No edema noted. No JVD and lung sounds clear. Kyrell requested appointment with Rocco Pauls. I scheduled same for Aug 2nd at 11:00. Home visit complete. I will see patient in one week.   Refills: Pantoprazole Potassium Vyndamax     Future Appointments  Date Time Provider [place]  10/04/2019  3:00 PM Hosie Spangle [facility]  12/11/2019  3:30 PM Gardiner Barefoot, DPM [facility]     ACTION: Home visit completed Next visit planned for one week

## 2019-09-20 ENCOUNTER — Telehealth: Payer: Self-pay | Admitting: Pharmacist

## 2019-09-20 MED ORDER — BUPROPION HCL ER (XL) 150 MG PO TB24
150.0000 mg | ORAL_TABLET | Freq: Every day | ORAL | 1 refills | Status: DC
Start: 2019-09-20 — End: 2019-10-18

## 2019-09-20 NOTE — Telephone Encounter (Signed)
Contacted patient in follow-up of potential tobacco cessation.  Patient reports continued smoking ~ 10-11 per day.  Patient continues to verbalize interest however he has made very little progress in cutting down on intake and has found gum, patches and chantix (varenicline) of little help.   We discussed use of bupropion.  He was willing to initiate a trial of this medication.  I agreed to start with new prescription for Bupropion 150mg  SL once daily in the AM. Provided 1 refill.  Goal was verbalized by the patient to be smoke < 5 cigs per day once he started bupropion.   I plan to call in 2 weeks to assess progress.

## 2019-09-21 ENCOUNTER — Other Ambulatory Visit: Payer: Self-pay | Admitting: Family Medicine

## 2019-09-21 MED ORDER — ACCU-CHEK GUIDE VI STRP: ORAL_STRIP | 12 refills | Status: DC

## 2019-09-21 MED ORDER — ACCU-CHEK SOFTCLIX LANCETS MISC
12 refills | Status: DC
Start: 2019-09-21 — End: 2020-11-20

## 2019-09-21 MED ORDER — ACCU-CHEK GUIDE W/DEVICE KIT: 1.0000 [IU] | PACK | Freq: Every day | 0 refills | Status: DC

## 2019-09-21 NOTE — Telephone Encounter (Signed)
I attempted to call patient for diabetic supply needs.  We do not have any records of prescribing him a glucometer or test strips.  Patient is not on insulin but does have diabetes.  Will reach back out to discuss his need for these diabetic supplies.

## 2019-09-21 NOTE — Telephone Encounter (Signed)
-----   Message from Jeris Penta, EMT sent at 09/20/2019  4:27 PM EDT ----- Regarding: Diabetic Supplies Hey Christy Sartorius, Hiro is requesting RX for diabetic supplies such as test strips and quick click lancets. I do not see the RX on his chart for same. Let me know how I can assist. Thanks.

## 2019-09-21 NOTE — Telephone Encounter (Signed)
Patient calls nurse line returning call to PCP. Patient reports he has been out of his DM supplies "for a while now." Patient reports his old meter being Accu-Chek. Patient reports he needs the matching test strips and lancets.   Medicaid should cover Accu-Chek Guide and Accu-Chek Guide strips, along with Accu-Chek Guide Soft/FastClix lancets.  Just add the diagnoses code and number of times testing per each prescription.

## 2019-09-21 NOTE — Addendum Note (Signed)
Addended by: Dorna Bloom on: 09/21/2019 01:59 PM   Modules accepted: Orders

## 2019-09-24 ENCOUNTER — Other Ambulatory Visit (HOSPITAL_COMMUNITY): Payer: Self-pay

## 2019-09-24 ENCOUNTER — Ambulatory Visit: Payer: Medicaid Other | Admitting: Family Medicine

## 2019-09-24 MED FILL — VYNDAMAX 61 MG CAPS: 61 | 15 days supply | Qty: 15 | Fill #10

## 2019-09-24 NOTE — Progress Notes (Signed)
Paramedicine Encounter    Patient ID: Russell Collier, male    DOB: August 09, 1960, 59 y.o.   MRN: [id number]   Patient Care Team: [name], MD as PCP - General (Family Medicine) [name], MD as PCP - Cardiology (Cardiology) Michael Boston, MD as Consulting Physician (General Surgery) Wilford Corner, MD as Consulting Physician (Gastroenterology) Jorge Ny, LCSW as Social Worker (Licensed Clinical Social Worker) Alda Berthold, DO as Consulting Physician (Neurology)  Patient Active Problem List   Diagnosis Date Noted  . Lower extremity edema   . 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 Acute respiratory failure ([place]) 07/27/2019  . Breast tenderness in male 05/31/2019  . Chronic pain 05/31/2019  . AKI (acute kidney injury) ([place]) 05/31/2019  . Pain due to onychomycosis of toenails of both feet 05/16/2019  . Chronic knee pain 04/16/2019  . Diabetes ([place]) 04/16/2019  . Esophageal reflux 02/06/2019  . Heartburn 02/06/2019  . Chronic skin ulcer of lower leg ([place]) 12/21/2018  . S/P drug eluting coronary stent placement   . Coronary artery disease involving native coronary artery of native heart with unstable angina pectoris ([place]) 12/20/2018  . Unstable angina ([place])   . 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 Acute on chronic diastolic heart failure ([place])   . Shortness of breath   . Precordial chest pain   . Idiopathic peripheral neuropathy   . Palpitations   . Chronic diastolic heart failure ([place])   . Abnormal ankle brachial index (ABI)   . Chest pain, rule out acute myocardial infarction 09/26/2018  . Dyspepsia   . Morbid obesity ([place])   . OSA on CPAP   . Chest pain 05/30/2018  . CAD (coronary artery disease) 03/30/2018  . Elevated troponin 02/03/2018  . Positive urine drug screen 02/03/2018  . Tobacco abuse 02/03/2018  . Hyperlipidemia 02/03/2018  . Acute respiratory failure with hypoxia ([place]) 02/02/2018  . Acute congestive heart failure ([place])   . Atypical chest pain  12/27/2015  . Essential hypertension   . Neuropathy     Current Outpatient Medications:  .  Accu-Chek Softclix Lancets lancets, Use as instructed, Disp: 100 each, Rfl: 12 .  albuterol (PROVENTIL) (2.5 MG/3ML) 0.083% nebulizer solution, Take 2.5 mg by nebulization every 6 (six) hours as needed for wheezing or shortness of breath., Disp: , Rfl:  .  albuterol (VENTOLIN HFA) 108 (90 Base) MCG/ACT inhaler, Inhale 2 puffs into the lungs every 6 (six) hours as needed for wheezing or shortness of breath., Disp: , Rfl:  .  apixaban (ELIQUIS) 5 MG TABS tablet, Take 1 tablet (5 mg total) by mouth 2 (two) times daily., Disp: 60 tablet, Rfl: 11 .  atorvastatin (LIPITOR) 80 MG tablet, Take 1 tablet (80 mg total) by mouth daily., Disp: 90 tablet, Rfl: 3 .  baclofen (LIORESAL) 10 MG tablet, Take 5-10 mg by mouth 3 (three) times daily as needed. (Patient not taking: Reported on 09/10/2019), Disp: , Rfl:  .  Blood Glucose Monitoring Suppl (ACCU-CHEK GUIDE) w/Device KIT, 1 Units by Does not apply route daily., Disp: 1 kit, Rfl: 0 .  buPROPion (WELLBUTRIN XL) 150 MG 24 hr tablet, Take 1 tablet (150 mg total) by mouth daily with breakfast., Disp: 30 tablet, Rfl: 1 .  clopidogrel (PLAVIX) 75 MG tablet, Take 1 tablet (75 mg total) by mouth daily with breakfast., Disp: 90 tablet, Rfl: 3 .  dapagliflozin propanediol (FARXIGA) 10 MG TABS tablet, Take 10 mg by mouth daily before breakfast., Disp: 30 tablet, Rfl: 6 .  enalapril (VASOTEC) 20 MG tablet, Take 20 mg by mouth at bedtime., Disp: , Rfl:  .  fluticasone (FLONASE) 50 MCG/ACT nasal spray, PLACE 1 SPRAY INTO BOTH NOSTRILS DAILY., Disp: 16 g, Rfl: 11 .  furosemide (LASIX) 40 MG tablet, Take 1 tablet (40 mg total) by mouth 2 (two) times daily., Disp: 180 tablet, Rfl: 3 .  glucose blood (ACCU-CHEK GUIDE) test strip, Use as instructed, Disp: 100 each, Rfl: 12 .  isosorbide mononitrate (IMDUR) 30 MG 24 hr tablet, Take 1 tablet (30 mg total) by mouth daily., Disp: 90 tablet,  Rfl: 3 .  lidocaine (LIDODERM) 5 %, Place 1 patch onto the skin daily. Remove & Discard patch within 12 hours or as directed by MD (Patient not taking: Reported on 09/10/2019), Disp: 5 patch, Rfl: 0 .  Multiple Vitamin (MULTIVITAMIN WITH MINERALS) TABS tablet, Take 1 tablet by mouth daily., Disp: , Rfl:  .  pantoprazole (PROTONIX) 40 MG tablet, Take 1 tablet (40 mg total) by mouth daily., Disp: 90 tablet, Rfl: 0 .  potassium chloride SA (KLOR-CON) 20 MEQ tablet, TAKE 1 TABLET(20 MEQ) BY MOUTH DAILY (Patient taking differently: Take 20 mEq by mouth daily. TAKE 1 TABLET(20 MEQ) BY MOUTH DAILY), Disp: 90 tablet, Rfl: 3 .  pregabalin (LYRICA) 100 MG capsule, Take 1 tablet three times daily (Patient taking differently: Take 100 mg by mouth 3 (three) times daily. Take 1 tablet three times daily), Disp: 270 capsule, Rfl: 3 .  Tafamidis (VYNDAMAX) 61 MG CAPS, Take 61 mg by mouth daily., Disp: , Rfl:  .  umeclidinium-vilanterol (ANORO ELLIPTA) 62.5-25 MCG/INH AEPB, Inhale 1 puff into the lungs daily., Disp: 60 each, Rfl: 2 .  valACYclovir (VALTREX) 500 MG tablet, Take 1 tablet (500 mg total) by mouth daily., Disp: 90 tablet, Rfl: 3 Allergies  Allergen Reactions  . Varenicline Other (See Comments)    Patient reports laryngospasm stopped in the ER     Social History   Socioeconomic History  . Marital status: Divorced    Spouse name: Not on file  . Number of children: 2  . Years of education: 10  . Highest education level: Not on file  Occupational History  . Occupation: not employed  Tobacco Use  . Smoking status: Current Every Day Smoker    Packs/day: 0.50    Years: 54.00    Pack years: 27.00    Types: Cigarettes, Cigars  . Smokeless tobacco: Never Used  . Tobacco comment: half pack daily  Vaping Use  . Vaping Use: Never used  Substance and Sexual Activity  . Alcohol use: Yes    Alcohol/week: 4.0 standard drinks    Types: 2 Cans of beer, 2 Shots of liquor per week  . Drug use: Not  Currently    Comment: last use may last year  . Sexual activity: Yes  Other Topics Concern  . Not on file  Social History Narrative   Right handed   Two story home   No caffeine   Social Determinants of Health   Financial Resource Strain: Low Risk   . Difficulty of Paying Living Expenses: Not very hard  Food Insecurity: No Food Insecurity  . Worried About Charity fundraiser in the Last Year: Never true  . Ran Out of Food in the Last Year: Never true  Transportation Needs: No Transportation Needs  . Lack of Transportation (Medical): No  . Lack of Transportation (Non-Medical): No  Physical Activity: Insufficiently Active  . Days of Exercise per Week:  2 days  . Minutes of Exercise per Session: 20 min  Stress: No Stress Concern Present  . Feeling of Stress : Not at all  Social Connections: Moderately Isolated  . Frequency of Communication with Friends and Family: More than three times a week  . Frequency of Social Gatherings with Friends and Family: Twice a week  . Attends Religious Services: 1 to 4 times per year  . Active Member of Clubs or Organizations: No  . Attends Archivist Meetings: Never  . Marital Status: Divorced  Human resources officer Violence: Not At Risk  . Fear of Current or Ex-Partner: No  . Emotionally Abused: No  . Physically Abused: No  . Sexually Abused: No    Physical Exam Vitals reviewed.  Constitutional:      Appearance: He is normal weight.  HENT:     Head: Normocephalic.     Nose: Nose normal.     Mouth/Throat:     Mouth: Mucous membranes are moist.  Eyes:     Pupils: Pupils are equal, round, and reactive to light.  Cardiovascular:     Rate and Rhythm: Normal rate and regular rhythm.     Heart sounds: Normal heart sounds.  Pulmonary:     Effort: Pulmonary effort is normal.     Breath sounds: Normal breath sounds.  Abdominal:     Palpations: Abdomen is soft.  Musculoskeletal:        General: Normal range of motion.  Skin:     General: Skin is warm and dry.     Capillary Refill: Capillary refill takes less than 2 seconds.  Neurological:     Mental Status: He is alert. Mental status is at baseline.  Psychiatric:        Mood and Affect: Mood normal.      Arrived for home visit for Ziggy who was alert and oriented reporting no complaints today. He stated he went to dentist earlier and he will be getting his dental implants later this month. Vitals were obtained and are as recorded. Medications were verified and confirmed. Pill box filled accordingly. (2 were filled due to my absence next week) [name] agreed to visit in two weeks. Home visit complete.   Refills: Farxiga Pantoprazole Eliquis    Future Appointments  Date Time Provider [place]  10/02/2019  1:20 PM Hilts, Legrand Como, MD [facility] None  10/04/2019  3:00 PM Herma Carson, PT [facility]  12/11/2019  3:30 PM Gardiner Barefoot, DPM [facility]     ACTION: Home visit completed Next visit planned for two weeks

## 2019-09-26 DIAGNOSIS — M1711 Unilateral primary osteoarthritis, right knee: Secondary | ICD-10-CM | POA: Diagnosis not present

## 2019-09-26 DIAGNOSIS — M67461 Ganglion, right knee: Secondary | ICD-10-CM | POA: Diagnosis not present

## 2019-09-26 DIAGNOSIS — M5416 Radiculopathy, lumbar region: Secondary | ICD-10-CM | POA: Diagnosis not present

## 2019-09-26 DIAGNOSIS — M5136 Other intervertebral disc degeneration, lumbar region: Secondary | ICD-10-CM | POA: Diagnosis not present

## 2019-09-26 DIAGNOSIS — M48061 Spinal stenosis, lumbar region without neurogenic claudication: Secondary | ICD-10-CM | POA: Diagnosis not present

## 2019-09-26 DIAGNOSIS — M4316 Spondylolisthesis, lumbar region: Secondary | ICD-10-CM | POA: Diagnosis not present

## 2019-09-27 DIAGNOSIS — M67461 Ganglion, right knee: Secondary | ICD-10-CM | POA: Insufficient documentation

## 2019-09-27 DIAGNOSIS — M51369 Other intervertebral disc degeneration, lumbar region without mention of lumbar back pain or lower extremity pain: Secondary | ICD-10-CM | POA: Insufficient documentation

## 2019-09-27 DIAGNOSIS — M5416 Radiculopathy, lumbar region: Secondary | ICD-10-CM | POA: Insufficient documentation

## 2019-09-27 DIAGNOSIS — M5136 Other intervertebral disc degeneration, lumbar region: Secondary | ICD-10-CM | POA: Insufficient documentation

## 2019-09-28 ENCOUNTER — Telehealth (HOSPITAL_COMMUNITY): Payer: Self-pay | Admitting: Pharmacy Technician

## 2019-09-28 NOTE — Telephone Encounter (Signed)
Patient Advocate Encounter   Received notification from [facility]  that prior authorization for Vyndamax is required.   PA submitted on CoverMyMeds Key [id number]  Status is pending   Will continue to follow.

## 2019-10-01 NOTE — Telephone Encounter (Signed)
Advanced Heart Failure Patient Advocate Encounter  Prior Authorization for Russell Collier has been approved.    PA# [id number] Effective dates: 09/28/19 through 09/27/20  Charlann Boxer, CPhT

## 2019-10-02 ENCOUNTER — Ambulatory Visit (INDEPENDENT_AMBULATORY_CARE_PROVIDER_SITE_OTHER): Payer: Medicaid Other | Admitting: Family Medicine

## 2019-10-02 ENCOUNTER — Encounter: Payer: Self-pay | Admitting: Family Medicine

## 2019-10-02 DIAGNOSIS — M5442 Lumbago with sciatica, left side: Secondary | ICD-10-CM

## 2019-10-02 DIAGNOSIS — G8929 Other chronic pain: Secondary | ICD-10-CM | POA: Diagnosis not present
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
## 2019-10-04 ENCOUNTER — Ambulatory Visit: Payer: Medicaid Other | Admitting: Physical Therapy

## 2019-10-04 ENCOUNTER — Telehealth: Payer: Self-pay | Admitting: Pharmacist

## 2019-10-04 NOTE — Telephone Encounter (Signed)
Contacted patient RE tobacco reduction and potential cessation.   Patient reports continued use of similar number of cigarettes.  He states he picked up the bupropion and is taking this medication without issue.    He agreed that the medication may be helping with reducing craving and has noted that he finds in easier to make it two hours or more between cigarettes.   He continues to smoke "about the same number per day".   We agreed that he should focus on limiting his intake to a certain number per day and focus on keeping his intake to that limit.  He stated he would try but notes that his back pain makes his cravings worse.   I agreed to call him again in 2 weeks to assess progress.  I am unsure of his level of commitment to quitting but will continue to work on intake reduction.

## 2019-10-08 ENCOUNTER — Other Ambulatory Visit (HOSPITAL_COMMUNITY): Payer: Self-pay

## 2019-10-08 NOTE — Progress Notes (Signed)
Paramedicine Encounter    Patient ID: Russell Collier, male    DOB: [birth year], 59 y.o.   MRN: [id number]   Patient Care Team: [name], MD as PCP - General (Family Medicine) [name], MD as PCP - Cardiology (Cardiology) Michael Boston, MD as Consulting Physician (General Surgery) Wilford Corner, MD as Consulting Physician (Gastroenterology) Jorge Ny, LCSW as Social Worker (Licensed Clinical Social Worker) Alda Berthold, DO as Consulting Physician (Neurology) Jola Baptist, [place] as Referring Physician (Chiropractic Medicine)  Patient Active Problem List   Diagnosis Date Noted  . Lower extremity edema   . 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 Acute respiratory failure ([place]) 07/27/2019  . Breast tenderness in male 05/31/2019  . Chronic pain 05/31/2019  . AKI (acute kidney injury) ([place]) 05/31/2019  . Pain due to onychomycosis of toenails of both feet 05/16/2019  . Chronic knee pain 04/16/2019  . Diabetes ([place]) 04/16/2019  . Esophageal reflux 02/06/2019  . Heartburn 02/06/2019  . Chronic skin ulcer of lower leg ([place]) 12/21/2018  . S/P drug eluting coronary stent placement   . Coronary artery disease involving native coronary artery of native heart with unstable angina pectoris ([place]) 12/20/2018  . Unstable angina ([place])   . 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 Acute on chronic diastolic heart failure ([place])   . Shortness of breath   . Precordial chest pain   . Idiopathic peripheral neuropathy   . Palpitations   . Chronic diastolic heart failure ([place])   . Abnormal ankle brachial index (ABI)   . Chest pain, rule out acute myocardial infarction 09/26/2018  . Dyspepsia   . Morbid obesity ([place])   . OSA on CPAP   . Chest pain 05/30/2018  . CAD (coronary artery disease) 03/30/2018  . Elevated troponin 02/03/2018  . Positive urine drug screen 02/03/2018  . Tobacco abuse 02/03/2018  . Hyperlipidemia 02/03/2018  . Acute respiratory failure with hypoxia ([place]) 02/02/2018   . Acute congestive heart failure ([place])   . Atypical chest pain 12/27/2015  . Essential hypertension   . Neuropathy     Current Outpatient Medications:  .  Accu-Chek Softclix Lancets lancets, Use as instructed, Disp: 100 each, Rfl: 12 .  albuterol (PROVENTIL) (2.5 MG/3ML) 0.083% nebulizer solution, Take 2.5 mg by nebulization every 6 (six) hours as needed for wheezing or shortness of breath., Disp: , Rfl:  .  albuterol (VENTOLIN HFA) 108 (90 Base) MCG/ACT inhaler, Inhale 2 puffs into the lungs every 6 (six) hours as needed for wheezing or shortness of breath., Disp: , Rfl:  .  apixaban (ELIQUIS) 5 MG TABS tablet, Take 1 tablet (5 mg total) by mouth 2 (two) times daily., Disp: 60 tablet, Rfl: 11 .  atorvastatin (LIPITOR) 80 MG tablet, Take 1 tablet (80 mg total) by mouth daily., Disp: 90 tablet, Rfl: 3 .  baclofen (LIORESAL) 10 MG tablet, Take 5-10 mg by mouth 3 (three) times daily as needed. (Patient not taking: Reported on 09/10/2019), Disp: , Rfl:  .  Blood Glucose Monitoring Suppl (ACCU-CHEK GUIDE) w/Device KIT, 1 Units by Does not apply route daily., Disp: 1 kit, Rfl: 0 .  buPROPion (WELLBUTRIN XL) 150 MG 24 hr tablet, Take 1 tablet (150 mg total) by mouth daily with breakfast., Disp: 30 tablet, Rfl: 1 .  clopidogrel (PLAVIX) 75 MG tablet, Take 1 tablet (75 mg total) by mouth daily with breakfast., Disp: 90 tablet, Rfl: 3 .  dapagliflozin propanediol (FARXIGA) 10 MG TABS tablet, Take 10 mg by mouth daily before  breakfast., Disp: 30 tablet, Rfl: 6 .  enalapril (VASOTEC) 20 MG tablet, Take 20 mg by mouth at bedtime., Disp: , Rfl:  .  fluticasone (FLONASE) 50 MCG/ACT nasal spray, PLACE 1 SPRAY INTO BOTH NOSTRILS DAILY., Disp: 16 g, Rfl: 11 .  furosemide (LASIX) 40 MG tablet, Take 1 tablet (40 mg total) by mouth 2 (two) times daily., Disp: 180 tablet, Rfl: 3 .  glucose blood (ACCU-CHEK GUIDE) test strip, Use as instructed, Disp: 100 each, Rfl: 12 .  isosorbide mononitrate (IMDUR) 30 MG 24 hr  tablet, Take 1 tablet (30 mg total) by mouth daily., Disp: 90 tablet, Rfl: 3 .  lidocaine (LIDODERM) 5 %, Place 1 patch onto the skin daily. Remove & Discard patch within 12 hours or as directed by MD, Disp: 5 patch, Rfl: 0 .  Multiple Vitamin (MULTIVITAMIN WITH MINERALS) TABS tablet, Take 1 tablet by mouth daily., Disp: , Rfl:  .  pantoprazole (PROTONIX) 40 MG tablet, Take 1 tablet (40 mg total) by mouth daily., Disp: 90 tablet, Rfl: 0 .  potassium chloride SA (KLOR-CON) 20 MEQ tablet, TAKE 1 TABLET(20 MEQ) BY MOUTH DAILY (Patient taking differently: Take 20 mEq by mouth daily. TAKE 1 TABLET(20 MEQ) BY MOUTH DAILY), Disp: 90 tablet, Rfl: 3 .  pregabalin (LYRICA) 100 MG capsule, Take 1 tablet three times daily (Patient taking differently: Take 100 mg by mouth 3 (three) times daily. Take 1 tablet three times daily), Disp: 270 capsule, Rfl: 3 .  Tafamidis (VYNDAMAX) 61 MG CAPS, Take 61 mg by mouth daily., Disp: , Rfl:  .  umeclidinium-vilanterol (ANORO ELLIPTA) 62.5-25 MCG/INH AEPB, Inhale 1 puff into the lungs daily., Disp: 60 each, Rfl: 2 .  valACYclovir (VALTREX) 500 MG tablet, Take 1 tablet (500 mg total) by mouth daily., Disp: 90 tablet, Rfl: 3 Allergies  Allergen Reactions  . Varenicline Other (See Comments)    Patient reports laryngospasm stopped in the ER     Social History   Socioeconomic History  . Marital status: Divorced    Spouse name: Not on file  . Number of children: 2  . Years of education: 10  . Highest education level: Not on file  Occupational History  . Occupation: not employed  Tobacco Use  . Smoking status: Current Every Day Smoker    Packs/day: 0.50    Years: 54.00    Pack years: 27.00    Types: Cigarettes, Cigars  . Smokeless tobacco: Never Used  . Tobacco comment: half pack daily  Vaping Use  . Vaping Use: Never used  Substance and Sexual Activity  . Alcohol use: Yes    Alcohol/week: 4.0 standard drinks    Types: 2 Cans of beer, 2 Shots of liquor per  week  . Drug use: Not Currently    Comment: last use may last year  . Sexual activity: Yes  Other Topics Concern  . Not on file  Social History Narrative   Right handed   Two story home   No caffeine   Social Determinants of Health   Financial Resource Strain: Low Risk   . Difficulty of Paying Living Expenses: Not very hard  Food Insecurity: No Food Insecurity  . Worried About Charity fundraiser in the Last Year: Never true  . Ran Out of Food in the Last Year: Never true  Transportation Needs: No Transportation Needs  . Lack of Transportation (Medical): No  . Lack of Transportation (Non-Medical): No  Physical Activity: Insufficiently Active  . Days of Exercise  per Week: 2 days  . Minutes of Exercise per Session: 20 min  Stress: No Stress Concern Present  . Feeling of Stress : Not at all  Social Connections: Moderately Isolated  . Frequency of Communication with Friends and Family: More than three times a week  . Frequency of Social Gatherings with Friends and Family: Twice a week  . Attends Religious Services: 1 to 4 times per year  . Active Member of Clubs or Organizations: No  . Attends Archivist Meetings: Never  . Marital Status: Divorced  Human resources officer Violence: Not At Risk  . Fear of Current or Ex-Partner: No  . Emotionally Abused: No  . Physically Abused: No  . Sexually Abused: No    Physical Exam Vitals reviewed.  Constitutional:      Appearance: He is normal weight.  HENT:     Head: Normocephalic.     Nose: Nose normal.     Mouth/Throat:     Mouth: Mucous membranes are moist.     Pharynx: Oropharynx is clear.  Eyes:     Pupils: Pupils are equal, round, and reactive to light.  Cardiovascular:     Rate and Rhythm: Normal rate and regular rhythm.     Pulses: Normal pulses.     Heart sounds: Normal heart sounds.  Pulmonary:     Effort: Pulmonary effort is normal.     Breath sounds: Normal breath sounds.  Abdominal:     Palpations: Abdomen  is soft.  Musculoskeletal:        General: Normal range of motion.     Cervical back: Normal range of motion.     Right lower leg: No edema.     Left lower leg: No edema.     Comments: Back pain  Skin:    General: Skin is warm and dry.     Capillary Refill: Capillary refill takes less than 2 seconds.  Neurological:     Mental Status: He is alert. Mental status is at baseline.  Psychiatric:        Mood and Affect: Mood normal.     Arrived for home visit for Russell Collier who was alert and oriented reporting he is having ongoing issues with his back and legs, he is reporting to a chiropractor this week per his ortho doc. Vitals obtained. Medications reviewed and confirmed. Pill box filled accordingly. Appointments reviewed. New CBG monitoring kit reviewed and education provided. Patient understand usage. I will see patient in one week. Home visit complete.   Refills: Russell Collier    CBG- 93   Future Appointments  Date Time Provider [place]  10/09/2019 11:45 AM Herma Carson, PT [facility] [place]  12/11/2019  3:30 PM Gardiner Barefoot, DPM [facility]     ACTION: Home visit completed Next visit planned for one week

## 2019-10-09 ENCOUNTER — Ambulatory Visit: Payer: Medicaid Other | Attending: Family Medicine | Admitting: Physical Therapy

## 2019-10-09 ENCOUNTER — Encounter: Payer: Self-pay | Admitting: Physical Therapy

## 2019-10-09 ENCOUNTER — Other Ambulatory Visit: Payer: Self-pay

## 2019-10-09 DIAGNOSIS — M545 Low back pain, unspecified: Secondary | ICD-10-CM

## 2019-10-09 DIAGNOSIS — G8929 Other chronic pain: Secondary | ICD-10-CM | POA: Insufficient documentation

## 2019-10-09 DIAGNOSIS — R2689 Other abnormalities of gait and mobility: Secondary | ICD-10-CM | POA: Insufficient documentation
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
## 2019-10-11 ENCOUNTER — Telehealth: Payer: Self-pay | Admitting: Physical Medicine and Rehabilitation

## 2019-10-11 ENCOUNTER — Other Ambulatory Visit: Payer: Self-pay | Admitting: Physical Medicine and Rehabilitation

## 2019-10-11 DIAGNOSIS — F411 Generalized anxiety disorder: Secondary | ICD-10-CM

## 2019-10-11 MED ORDER — DIAZEPAM 5 MG PO TABS: ORAL_TABLET | ORAL | 0 refills | Status: DC

## 2019-10-11 NOTE — Progress Notes (Signed)
Pre-procedure diazepam ordered for pre-operative anxiety.  

## 2019-10-11 NOTE — Telephone Encounter (Signed)
Pt would like a CB in regards to the procedure on 11/22/19, he would like an explanation of all that will happen  [phone]

## 2019-10-11 NOTE — Telephone Encounter (Signed)
Patient would like Valium prior to [facility] on 9/30. Pharmacy is correct.

## 2019-10-15 ENCOUNTER — Other Ambulatory Visit (HOSPITAL_COMMUNITY): Payer: Self-pay

## 2019-10-15 MED FILL — VYNDAMAX 61 MG CAPS: 61 | 30 days supply | Qty: 30 | Fill #11

## 2019-10-15 NOTE — Progress Notes (Signed)
Paramedicine Encounter    Patient ID: Russell Collier, male    DOB: October 15, 1960, 59 y.o.   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[place])    Acute respiratory failure ([place]) 07/27/2019   Breast tenderness in male 05/31/2019   Chronic pain 05/31/2019   AKI (acute kidney injury) ([name]) 05/31/2019   Pain due to onychomycosis of toenails of both feet 05/16/2019   Chronic knee pain 04/16/2019   Diabetes ([place]) 04/16/2019   Esophageal reflux 02/06/2019   Heartburn 02/06/2019   Chronic skin ulcer of lower leg ([place]) 12/21/2018   S/P drug eluting coronary stent placement    Coronary artery disease involving native coronary artery of native heart with unstable angina pectoris ([place]) 12/20/2018   Unstable angina ([place])    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   Acute on chronic diastolic heart failure ([place])    Shortness of breath    Precordial chest pain    Idiopathic peripheral neuropathy    Palpitations    Chronic diastolic heart failure (HCC)    Abnormal ankle brachial index (ABI)    Chest pain, rule out acute myocardial infarction 09/26/2018   Dyspepsia    Morbid obesity ([place])    OSA on CPAP    Chest pain 05/30/2018   CAD (coronary artery disease) 03/30/2018   Elevated troponin 02/03/2018   Positive urine drug screen 02/03/2018   Tobacco abuse 02/03/2018   Hyperlipidemia 02/03/2018   Acute respiratory failure with hypoxia (HCC) 02/02/2018    Acute congestive heart failure (HCC)    Atypical chest pain 12/27/2015   Essential hypertension    Neuropathy     Current Outpatient Medications:    Accu-Chek Softclix Lancets lancets, Use as instructed, Disp: 100 each, Rfl: 12   albuterol (PROVENTIL) (2.5 MG/3ML) 0.083% nebulizer solution, Take 2.5 mg by nebulization every 6 (six) hours as needed for wheezing or shortness of breath., Disp: , Rfl:    albuterol (VENTOLIN HFA) 108 (90 Base) MCG/ACT inhaler, Inhale 2 puffs into the lungs every 6 (six) hours as needed for wheezing or shortness of breath., Disp: , Rfl:    apixaban (ELIQUIS) 5 MG TABS tablet, Take 1 tablet (5 mg total) by mouth 2 (two) times daily., Disp: 60 tablet, Rfl: 11   atorvastatin (LIPITOR) 80 MG tablet, Take 1 tablet (80 mg total) by mouth daily., Disp: 90 tablet, Rfl: 3   baclofen (LIORESAL) 10 MG tablet, Take 5-10 mg by mouth 3 (three) times daily as needed. (Patient not taking: Reported on 09/10/2019), Disp: , Rfl:    Blood Glucose Monitoring Suppl (ACCU-CHEK GUIDE) w/Device KIT, 1 Units by Does not apply route daily., Disp: 1 kit, Rfl: 0   buPROPion (WELLBUTRIN XL) 150 MG 24 hr tablet, Take 1 tablet (150 mg total) by mouth daily with breakfast. (Patient not taking: Reported on 10/08/2019), Disp: 30 tablet, Rfl: 1   clopidogrel (PLAVIX) 75 MG tablet, Take 1 tablet (75 mg total) by mouth daily with breakfast., Disp: 90 tablet, Rfl: 3   dapagliflozin propanediol (FARXIGA) 10 MG TABS tablet, Take  10 mg by mouth daily before breakfast., Disp: 30 tablet, Rfl: 6   diazepam (VALIUM) 5 MG tablet, Take 1 by mouth 1 hour  pre-procedure with very light food. May bring 2nd tablet to appointment., Disp: 2 tablet, Rfl: 0   enalapril (VASOTEC) 20 MG tablet, Take 20 mg by mouth at bedtime., Disp: , Rfl:    fluticasone (FLONASE) 50 MCG/ACT nasal spray, PLACE 1 SPRAY INTO BOTH NOSTRILS DAILY., Disp: 16 g, Rfl: 11   furosemide (LASIX) 40 MG tablet, Take 1 tablet (40 mg  total) by mouth 2 (two) times daily., Disp: 180 tablet, Rfl: 3   glucose blood (ACCU-CHEK GUIDE) test strip, Use as instructed, Disp: 100 each, Rfl: 12   isosorbide mononitrate (IMDUR) 30 MG 24 hr tablet, Take 1 tablet (30 mg total) by mouth daily., Disp: 90 tablet, Rfl: 3   lidocaine (LIDODERM) 5 %, Place 1 patch onto the skin daily. Remove & Discard patch within 12 hours or as directed by MD (Patient not taking: Reported on 10/08/2019), Disp: 5 patch, Rfl: 0   Multiple Vitamin (MULTIVITAMIN WITH MINERALS) TABS tablet, Take 1 tablet by mouth daily., Disp: , Rfl:    pantoprazole (PROTONIX) 40 MG tablet, Take 1 tablet (40 mg total) by mouth daily., Disp: 90 tablet, Rfl: 0   potassium chloride SA (KLOR-CON) 20 MEQ tablet, TAKE 1 TABLET(20 MEQ) BY MOUTH DAILY (Patient taking differently: Take 20 mEq by mouth daily. TAKE 1 TABLET(20 MEQ) BY MOUTH DAILY), Disp: 90 tablet, Rfl: 3   pregabalin (LYRICA) 100 MG capsule, Take 1 tablet three times daily (Patient taking differently: Take 100 mg by mouth 3 (three) times daily. Take 1 tablet three times daily), Disp: 270 capsule, Rfl: 3   Tafamidis (VYNDAMAX) 61 MG CAPS, Take 61 mg by mouth daily., Disp: , Rfl:    umeclidinium-vilanterol (ANORO ELLIPTA) 62.5-25 MCG/INH AEPB, Inhale 1 puff into the lungs daily., Disp: 60 each, Rfl: 2   valACYclovir (VALTREX) 500 MG tablet, Take 1 tablet (500 mg total) by mouth daily., Disp: 90 tablet, Rfl: 3 Allergies  Allergen Reactions   Varenicline Other (See Comments)    Patient reports laryngospasm stopped in the ER     Social History   Socioeconomic History   Marital status: Divorced    Spouse name: Not on file   Number of children: 2   Years of education: 10   Highest education level: Not on file  Occupational History   Occupation: not employed  Tobacco Use   Smoking status: Current Every Day Smoker    Packs/day: 0.50    Years: 54.00    Pack years: 27.00    Types: Cigarettes, Cigars    Smokeless tobacco: Never Used   Tobacco comment: half pack daily  Vaping Use   Vaping Use: Never used  Substance and Sexual Activity   Alcohol use: Yes    Alcohol/week: 4.0 standard drinks    Types: 2 Cans of beer, 2 Shots of liquor per week   Drug use: Not Currently    Comment: last use may last year   Sexual activity: Yes  Other Topics Concern   Not on file  Social History Narrative   Right handed   Two story home   No caffeine   Social Determinants of Health   Financial Resource Strain: Low Risk    Difficulty of Paying Living Expenses: Not very hard  Food Insecurity: No Food Insecurity   Worried About Running Out of Food in the Last Year: Never true  Ran Out of Food in the Last Year: Never true  Transportation Needs: No Transportation Needs   Lack of Transportation (Medical): No   Lack of Transportation (Non-Medical): No  Physical Activity: Insufficiently Active   Days of Exercise per Week: 2 days   Minutes of Exercise per Session: 20 min  Stress: No Stress Concern Present   Feeling of Stress : Not at all  Social Connections: Moderately Isolated   Frequency of Communication with Friends and Family: More than three times a week   Frequency of Social Gatherings with Friends and Family: Twice a week   Attends Religious Services: 1 to 4 times per year   Active Member of [employer] or Organizations: No   Attends Archivist Meetings: Never   Marital Status: Divorced  Human resources officer Violence: Not At Risk   Fear of Current or Ex-Partner: No   Emotionally Abused: No   Physically Abused: No   Sexually Abused: No    Physical Exam Vitals reviewed.  Constitutional:      Appearance: He is normal weight.  HENT:     Head: Normocephalic.     Nose: Nose normal.     Mouth/Throat:     Mouth: Mucous membranes are moist.     Pharynx: Oropharynx is clear.  Eyes:     Pupils: Pupils are equal, round, and reactive to light.  Cardiovascular:      Rate and Rhythm: Normal rate and regular rhythm.     Pulses: Normal pulses.     Heart sounds: Normal heart sounds.  Pulmonary:     Effort: Pulmonary effort is normal.     Breath sounds: Normal breath sounds.  Abdominal:     Palpations: Abdomen is soft.  Musculoskeletal:        General: Normal range of motion.     Cervical back: Normal range of motion.     Right lower leg: Edema present.     Left lower leg: Edema present.  Skin:    General: Skin is warm and dry.     Capillary Refill: Capillary refill takes less than 2 seconds.  Neurological:     Mental Status: He is alert. Mental status is at baseline.  Psychiatric:        Mood and Affect: Mood normal.     Arrived for home visit for Russell Collier who was alert and oriented reporting he was feeling okay but had an episode of a nose bleed with vomiting on Saturday following use of alcohol. Russell Collier reports he only drank a small amount. Russell Collier has been compliant with all medications and reports no other symptoms other than some fatigue. Vitals were obtained. Medications were reviewed and confirmed. Pill box filled accordingly. I advised [name] to report to me any further bleeding. He agreed and understood. Home visit complete. I will see [name] in one week.   Refills- Vyndamax Valtrex  CBG- 105     Future Appointments  Date Time Provider [place]  10/24/2019  1:30 PM Herma Carson, PT [name]  10/31/2019  2:15 PM Herma Carson, PT [facility]  11/02/2019  1:15 PM Herma Carson, PT [facility]  11/05/2019  2:15 PM Herma Carson, PT [facility]  11/07/2019  2:15 PM Herma Carson, PT [facility]  11/12/2019  2:15 PM Herma Carson, PT [facility]  11/14/2019  2:15 PM Herma Carson, PT [facility]  11/22/2019  1:30 PM Magnus Sinning, MD [facility] None  12/11/2019  3:30 PM Gardiner Barefoot, DPM [facility]     ACTION: Home visit completed Next visit planned  for one week

## 2019-10-18 ENCOUNTER — Telehealth: Payer: Self-pay | Admitting: Pharmacist

## 2019-10-18 IMAGING — DX PORTABLE CHEST - 1 VIEW
1 series · 1 of 1 positions shown · non-contrast
Comparison: 08/17/2018.

CLINICAL DATA: Chest pain.  Shortness of breath.

EXAM:
PORTABLE CHEST 1 VIEW

[chest ap]
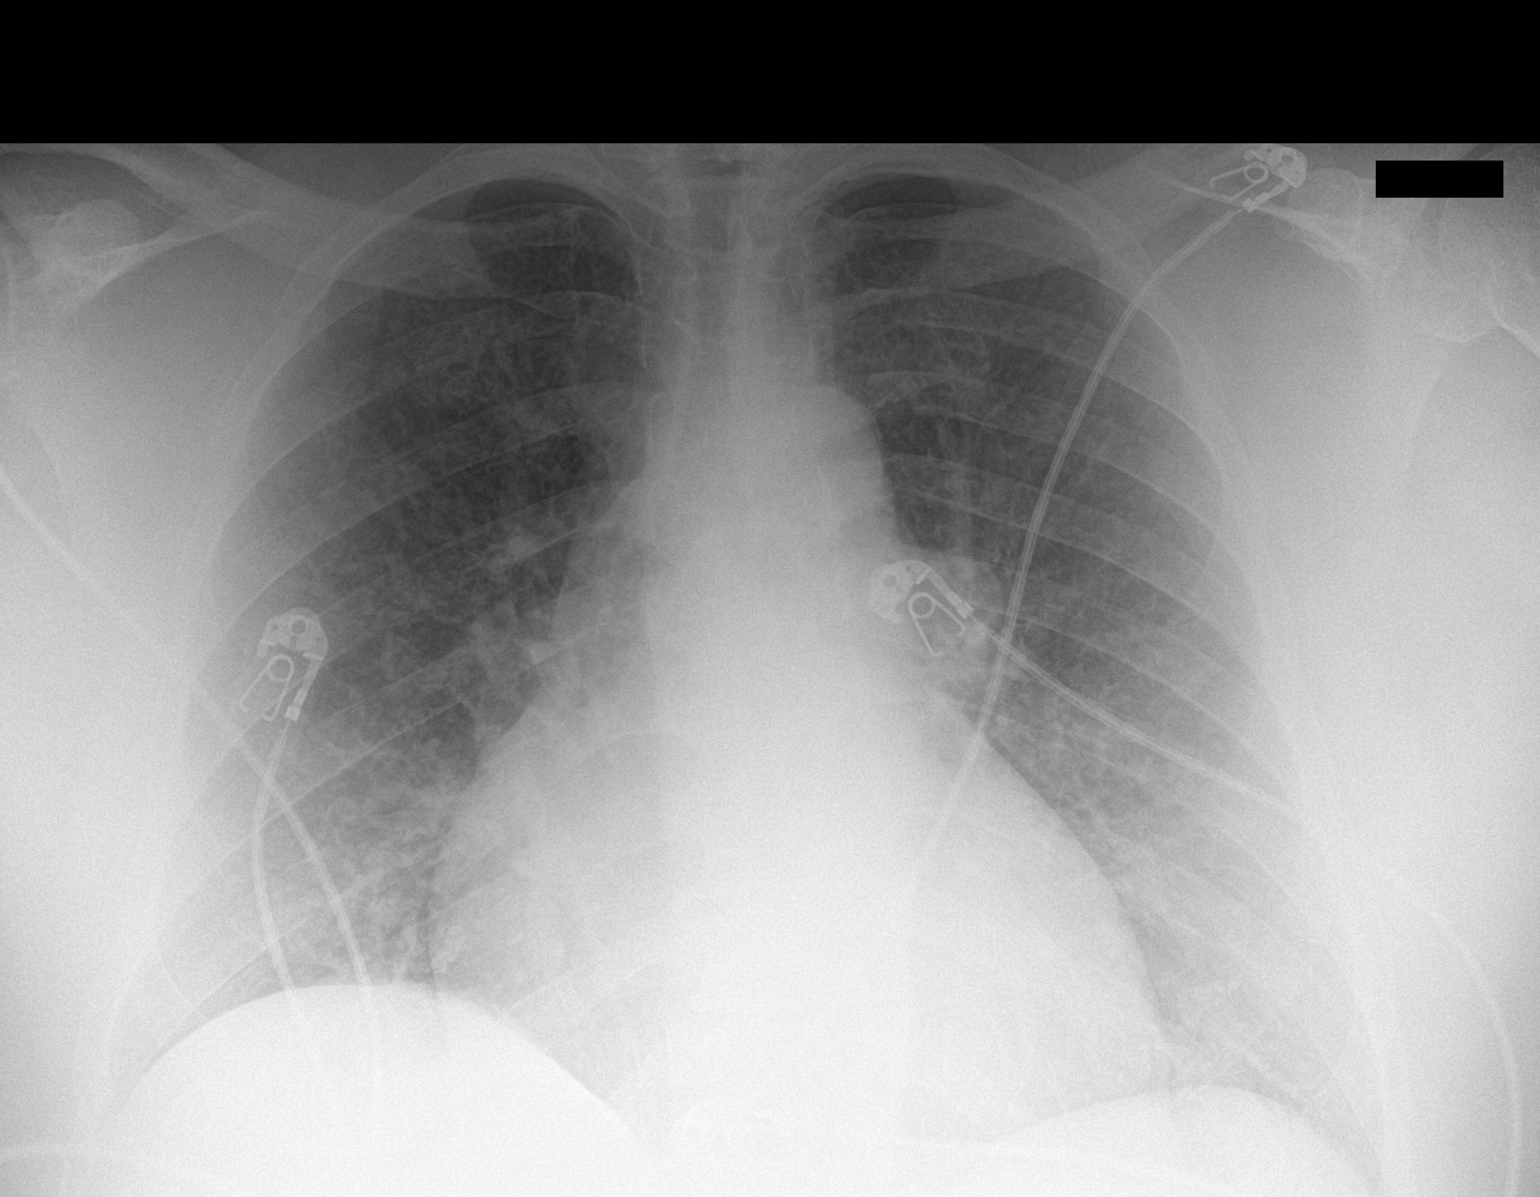

[1 of 1 positions shown; findings below may reference images not displayed]

FINDINGS: Cardiomegaly with mild pulmonary venous congestion and bilateral
interstitial prominence. A mild component of CHF with mild
interstitial edema cannot be excluded. Pneumonitis can not be
excluded. Low lung volumes. No pleural effusion or pneumothorax.
IMPRESSION: Cardiomegaly with mild pulmonary venous congestion. Mild bilateral
interstitial prominence. Mild interstitial edema cannot be excluded.
Low lung volumes.

## 2019-10-18 NOTE — Telephone Encounter (Signed)
Contacted patient RE tobacco cessation.   He reports continued intake of ~ 10 cigarettes per day at this time.  He also shared that he has been scheduled for a procedure on his back in the future.  He hopes that can minimize his back pain.   Patient reported moderate to severe headache with wellbutrin and self-discontinued.  He does not want to take that medication again.   We discussed strategies for further reduction including quantity and "specific trigger avoidance".  Patient was wiling to make an attempt at quantity reduction and set his number at 7 per day.  I encouraged him in this plan.   I will plan telephone contact in 2 weeks to assess progress toward quitting.

## 2019-10-22 ENCOUNTER — Other Ambulatory Visit (HOSPITAL_COMMUNITY): Payer: Self-pay

## 2019-10-22 NOTE — Progress Notes (Signed)
Paramedicine Encounter    Patient ID: Russell Collier, male    DOB: 01-Oct-1960, 59 y.o.   MRN: [id number]   Patient Care Team: [name], MD as PCP - General (Family Medicine) [name], MD as PCP - Cardiology (Cardiology) Michael Boston, MD as Consulting Physician (General Surgery) Wilford Corner, MD as Consulting Physician (Gastroenterology) Jorge Ny, LCSW as Social Worker (Licensed Clinical Social Worker) Alda Berthold, DO as Consulting Physician (Neurology) Jola Baptist, [place] as Referring Physician (Chiropractic Medicine)  Patient Active Problem List   Diagnosis Date Noted  . Lower extremity edema   . 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 Acute respiratory failure ([place]) 07/27/2019  . Breast tenderness in male 05/31/2019  . Chronic pain 05/31/2019  . AKI (acute kidney injury) ([place]) 05/31/2019  . Pain due to onychomycosis of toenails of both feet 05/16/2019  . Chronic knee pain 04/16/2019  . Diabetes ([place]) 04/16/2019  . Esophageal reflux 02/06/2019  . Heartburn 02/06/2019  . Chronic skin ulcer of lower leg ([place]) 12/21/2018  . S/P drug eluting coronary stent placement   . Coronary artery disease involving native coronary artery of native heart with unstable angina pectoris ([place]) 12/20/2018  . Unstable angina ([place])   . 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 Acute on chronic diastolic heart failure ([place])   . Shortness of breath   . Precordial chest pain   . Idiopathic peripheral neuropathy   . Palpitations   . Chronic diastolic heart failure ([place])   . Abnormal ankle brachial index (ABI)   . Chest pain, rule out acute myocardial infarction 09/26/2018  . Dyspepsia   . Morbid obesity ([place])   . OSA on CPAP   . Chest pain 05/30/2018  . CAD (coronary artery disease) 03/30/2018  . Elevated troponin 02/03/2018  . Positive urine drug screen 02/03/2018  . Tobacco abuse 02/03/2018  . Hyperlipidemia 02/03/2018  . Acute respiratory failure with hypoxia ([place]) 02/02/2018   . Acute congestive heart failure ([place])   . Atypical chest pain 12/27/2015  . Essential hypertension   . Neuropathy     Current Outpatient Medications:  .  Accu-Chek Softclix Lancets lancets, Use as instructed, Disp: 100 each, Rfl: 12 .  albuterol (PROVENTIL) (2.5 MG/3ML) 0.083% nebulizer solution, Take 2.5 mg by nebulization every 6 (six) hours as needed for wheezing or shortness of breath., Disp: , Rfl:  .  albuterol (VENTOLIN HFA) 108 (90 Base) MCG/ACT inhaler, Inhale 2 puffs into the lungs every 6 (six) hours as needed for wheezing or shortness of breath., Disp: , Rfl:  .  apixaban (ELIQUIS) 5 MG TABS tablet, Take 1 tablet (5 mg total) by mouth 2 (two) times daily., Disp: 60 tablet, Rfl: 11 .  atorvastatin (LIPITOR) 80 MG tablet, Take 1 tablet (80 mg total) by mouth daily., Disp: 90 tablet, Rfl: 3 .  baclofen (LIORESAL) 10 MG tablet, Take 5-10 mg by mouth 3 (three) times daily as needed. (Patient not taking: Reported on 09/10/2019), Disp: , Rfl:  .  Blood Glucose Monitoring Suppl (ACCU-CHEK GUIDE) w/Device KIT, 1 Units by Does not apply route daily., Disp: 1 kit, Rfl: 0 .  clopidogrel (PLAVIX) 75 MG tablet, Take 1 tablet (75 mg total) by mouth daily with breakfast., Disp: 90 tablet, Rfl: 3 .  dapagliflozin propanediol (FARXIGA) 10 MG TABS tablet, Take 10 mg by mouth daily before breakfast., Disp: 30 tablet, Rfl: 6 .  diazepam (VALIUM) 5 MG tablet, Take 1 by mouth 1 hour  pre-procedure with very light food. May  bring 2nd tablet to appointment., Disp: 2 tablet, Rfl: 0 .  enalapril (VASOTEC) 20 MG tablet, Take 20 mg by mouth at bedtime., Disp: , Rfl:  .  fluticasone (FLONASE) 50 MCG/ACT nasal spray, PLACE 1 SPRAY INTO BOTH NOSTRILS DAILY., Disp: 16 g, Rfl: 11 .  furosemide (LASIX) 40 MG tablet, Take 1 tablet (40 mg total) by mouth 2 (two) times daily., Disp: 180 tablet, Rfl: 3 .  glucose blood (ACCU-CHEK GUIDE) test strip, Use as instructed, Disp: 100 each, Rfl: 12 .  isosorbide mononitrate  (IMDUR) 30 MG 24 hr tablet, Take 1 tablet (30 mg total) by mouth daily., Disp: 90 tablet, Rfl: 3 .  lidocaine (LIDODERM) 5 %, Place 1 patch onto the skin daily. Remove & Discard patch within 12 hours or as directed by MD (Patient not taking: Reported on 10/08/2019), Disp: 5 patch, Rfl: 0 .  Multiple Vitamin (MULTIVITAMIN WITH MINERALS) TABS tablet, Take 1 tablet by mouth daily., Disp: , Rfl:  .  pantoprazole (PROTONIX) 40 MG tablet, Take 1 tablet (40 mg total) by mouth daily., Disp: 90 tablet, Rfl: 0 .  potassium chloride SA (KLOR-CON) 20 MEQ tablet, TAKE 1 TABLET(20 MEQ) BY MOUTH DAILY (Patient taking differently: Take 20 mEq by mouth daily. TAKE 1 TABLET(20 MEQ) BY MOUTH DAILY), Disp: 90 tablet, Rfl: 3 .  pregabalin (LYRICA) 100 MG capsule, Take 1 tablet three times daily (Patient taking differently: Take 100 mg by mouth 3 (three) times daily. Take 1 tablet three times daily), Disp: 270 capsule, Rfl: 3 .  Tafamidis (VYNDAMAX) 61 MG CAPS, Take 61 mg by mouth daily., Disp: , Rfl:  .  umeclidinium-vilanterol (ANORO ELLIPTA) 62.5-25 MCG/INH AEPB, Inhale 1 puff into the lungs daily., Disp: 60 each, Rfl: 2 .  valACYclovir (VALTREX) 500 MG tablet, Take 1 tablet (500 mg total) by mouth daily., Disp: 90 tablet, Rfl: 3 Allergies  Allergen Reactions  . Varenicline Other (See Comments)    Patient reports laryngospasm stopped in the ER  . Bupropion Other (See Comments)    Headache - moderate/severe - self discontinued agent      Social History   Socioeconomic History  . Marital status: Divorced    Spouse name: Not on file  . Number of children: 2  . Years of education: 10  . Highest education level: Not on file  Occupational History  . Occupation: not employed  Tobacco Use  . Smoking status: Current Every Day Smoker    Packs/day: 0.50    Years: 54.00    Pack years: 27.00    Types: Cigarettes, Cigars  . Smokeless tobacco: Never Used  . Tobacco comment: half pack daily  Vaping Use  . Vaping  Use: Never used  Substance and Sexual Activity  . Alcohol use: Yes    Alcohol/week: 4.0 standard drinks    Types: 2 Cans of beer, 2 Shots of liquor per week  . Drug use: Not Currently    Comment: last use may last year  . Sexual activity: Yes  Other Topics Concern  . Not on file  Social History Narrative   Right handed   Two story home   No caffeine   Social Determinants of Health   Financial Resource Strain: Low Risk   . Difficulty of Paying Living Expenses: Not very hard  Food Insecurity: No Food Insecurity  . Worried About Charity fundraiser in the Last Year: Never true  . Ran Out of Food in the Last Year: Never true  Transportation Needs:  No Transportation Needs  . Lack of Transportation (Medical): No  . Lack of Transportation (Non-Medical): No  Physical Activity:   . Days of Exercise per Week: Not on file  . Minutes of Exercise per Session: Not on file  Stress:   . Feeling of Stress : Not on file  Social Connections:   . Frequency of Communication with Friends and Family: Not on file  . Frequency of Social Gatherings with Friends and Family: Not on file  . Attends Religious Services: Not on file  . Active Member of Clubs or Organizations: Not on file  . Attends Archivist Meetings: Not on file  . Marital Status: Not on file  Intimate Partner Violence:   . Fear of Current or Ex-Partner: Not on file  . Emotionally Abused: Not on file  . Physically Abused: Not on file  . Sexually Abused: Not on file    Physical Exam Vitals reviewed.  Constitutional:      Appearance: He is normal weight.  HENT:     Head: Normocephalic.     Nose: Nose normal.     Mouth/Throat:     Mouth: Mucous membranes are moist.     Pharynx: Oropharynx is clear.  Eyes:     Pupils: Pupils are equal, round, and reactive to light.  Cardiovascular:     Rate and Rhythm: Normal rate and regular rhythm.     Heart sounds: Normal heart sounds.  Pulmonary:     Effort: Pulmonary effort  is normal.     Breath sounds: Normal breath sounds.  Abdominal:     General: Abdomen is flat.     Palpations: Abdomen is soft.  Musculoskeletal:        General: Normal range of motion.     Cervical back: Normal range of motion.     Right lower leg: No edema.     Left lower leg: No edema.  Skin:    General: Skin is warm and dry.     Capillary Refill: Capillary refill takes less than 2 seconds.  Neurological:     Mental Status: He is alert. Mental status is at baseline.  Psychiatric:        Mood and Affect: Mood normal.      Arrived for home visit for Russell Collier who was alert and oriented reporting he has had multiple nose bleeds over the last week and has been able to control them at home however he is concerned with same. I was able to schedule him an appointment with his PCP for Friday the 3rd at 1:30 for follow up. Russell Collier was advised to seek care at Urgent Care if nose bleed was unable to be stopped. He agreed and understood. Vitals were obtained. Russell Collier was compliant with medications for last week. I reviewed meds and verified same filling two pill boxes due to holiday on Monday. Russell Collier understood to call with any concerns. I will see Russell Collier in two weeks. Home visit complete.   Refills:  Eliquis Russell Collier    Future Appointments  Date Time Provider [place]  10/24/2019  1:30 PM Herma Carson, PT [facility]  10/26/2019  1:30 PM ACCESS TO CARE POOL [facility]  10/31/2019  2:15 PM Herma Carson, PT [facility]  11/02/2019  1:15 PM Herma Carson, PT [facility]  11/05/2019  2:15 PM Herma Carson, PT [facility]  11/07/2019  2:15 PM Herma Carson, PT [facility]  11/12/2019  2:15  PM Hosie Spangle [facility]  11/14/2019  2:15 PM Hosie Spangle [facility]  11/22/2019  1:30 PM Magnus Sinning, MD [facility] None  12/11/2019  3:30 PM Gardiner Barefoot, DPM [facility]     ACTION: Home visit  completed Next visit planned for two weeks

## 2019-10-24 ENCOUNTER — Encounter: Payer: Self-pay | Admitting: Physical Therapy

## 2019-10-24 ENCOUNTER — Other Ambulatory Visit: Payer: Self-pay

## 2019-10-24 ENCOUNTER — Ambulatory Visit: Payer: Medicaid Other | Attending: Family Medicine | Admitting: Physical Therapy

## 2019-10-24 DIAGNOSIS — R2689 Other abnormalities of gait and mobility: Secondary | ICD-10-CM | POA: Insufficient documentation

## 2019-10-24 DIAGNOSIS — M545 Low back pain, unspecified: Secondary | ICD-10-CM

## 2019-10-24 DIAGNOSIS — G8929 Other chronic pain: Secondary | ICD-10-CM | POA: Insufficient documentation
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
## 2019-10-26 ENCOUNTER — Other Ambulatory Visit: Payer: Self-pay

## 2019-10-26 ENCOUNTER — Ambulatory Visit (INDEPENDENT_AMBULATORY_CARE_PROVIDER_SITE_OTHER): Payer: Medicaid Other | Admitting: Family Medicine

## 2019-10-26 VITALS — BP 106/60 | HR 79 | Ht 70.0 in | Wt 317.8 lb

## 2019-10-26 DIAGNOSIS — Z23 Encounter for immunization: Secondary | ICD-10-CM | POA: Diagnosis not present

## 2019-10-26 DIAGNOSIS — R040 Epistaxis: Secondary | ICD-10-CM

## 2019-10-26 LAB — POCT HEMOGLOBIN: Hemoglobin: 12.8 g/dL (ref 11–14.6)

## 2019-10-26 MED ORDER — ACETAMINOPHEN ER 650 MG PO TBCR: 650.0000 mg | EXTENDED_RELEASE_TABLET | Freq: Three times a day (TID) | ORAL | 0 refills | Status: AC | PRN

## 2019-10-26 MED ORDER — AFRIN NASAL SPRAY 0.05 % NA SOLN
2.0000 | Freq: Every day | NASAL | 0 refills | Status: DC
Start: 2019-10-26 — End: 2020-01-31

## 2019-10-26 NOTE — Patient Instructions (Addendum)
Thank you for coming to see me today. It was a pleasure.   Stop taking Ibuprofen and do not take any NSAID containing products.  Start Tylenol 1300 mg three times a day.  Please follow up with PCP if not helping.  You can use Afrin nasal spray when you have active bleeding.  Do not use for more than 5 consecutive days.  If you have worsening symptoms please call your PCP or go to Urgent care   If you have any questions or concerns, please do not hesitate to call the office at [phone].  Best,   Carollee Leitz, MD Family Medicine Residency

## 2019-10-30 ENCOUNTER — Encounter: Payer: Self-pay | Admitting: Family Medicine

## 2019-10-30 DIAGNOSIS — R040 Epistaxis: Secondary | ICD-10-CM | POA: Insufficient documentation

## 2019-10-30 NOTE — Progress Notes (Signed)
    SUBJECTIVE:   CHIEF COMPLAINT / HPI: Nosebleeds  Patient reports having nosebleeds for about 3 months.  Reports worsening within the last few weeks and notes that is been happening every other day.  Last reported bleed 4 days ago which lasted about 5 minutes.  Patient reports that bleeding is sporadic and unknown triggers.  He reports that bleeding is always from left nostril.  He reports blowing his nose when having bleed and notices small blood clots.  Denies any weakness, shortness of breath or chest pain.  He is currently on DAPT for CAD and is followed by cardiology.  He reports also taking 2400 mg of ibuprofen for chronic back pain.  He has not used nasal spray in about 1 month and reports no cocaine use x1.5 years.  PERTINENT  PMH / PSH:  DAPT therapy for CAD NSAIDs for chronic back pain   OBJECTIVE:   BP 106/60   Pulse 79   Ht 5\' 10"  (1.778 m)   Wt (!) 317 lb 12.8 oz (144.2 kg)   SpO2 99%   BMI 45.60 kg/m    General: Alert and oriented, no apparent distress  ENTM: No pharyengeal erythema or posterior drainage noted, nasal turbinates slightly erythematous bilaterally, no active bleeding noted Neck: nontender Cardiovascular: RRR with no murmurs noted Respiratory: CTA bilaterally   ASSESSMENT/PLAN:   Mild epistaxis Ongoing for 3 months.  Likely secondary to DAPT and Ibuprofen use.  No evidence of bleeding at this time.   -Hbg 12.8 today -Advised to stop Ibuprofen and avoid any NSAID containing products. -Afrin 2 sprays to each nostril at onset of nosebleed, educated to not use daily -Advised to not blow nose when bleeding, apply pressure to nasal bridge for 10-15 mins with head lowered -If symptoms worsen would consider ENT referral -Strict return precautions provided -Follow up with PCP as needed      Carollee Leitz, MD [place]

## 2019-10-30 NOTE — Assessment & Plan Note (Signed)
Ongoing for 3 months.  Likely secondary to DAPT and Ibuprofen use.  No evidence of bleeding at this time.   -Hbg 12.8 today -Advised to stop Ibuprofen and avoid any NSAID containing products. -Afrin 2 sprays to each nostril at onset of nosebleed, educated to not use daily -Advised to not blow nose when bleeding, apply pressure to nasal bridge for 10-15 mins with head lowered -If symptoms worsen would consider ENT referral -Strict return precautions provided -Follow up with PCP as needed

## 2019-10-31 ENCOUNTER — Ambulatory Visit: Payer: Medicaid Other | Admitting: Physical Therapy

## 2019-11-01 ENCOUNTER — Telehealth: Payer: Self-pay | Admitting: Family Medicine

## 2019-11-01 NOTE — Telephone Encounter (Signed)
Routing to PCP and doctor who last saw pt to see if by chance they tried to call him. Lucero Ide Zimmerman Rumple, CMA

## 2019-11-01 NOTE — Telephone Encounter (Signed)
Patient is returning a missed call from our office. He said there was no voicemail and I dont see anything documented in chart.   Pt had appointment on 10/26/19. He would like whoever called to call him back at [phone]

## 2019-11-01 NOTE — Telephone Encounter (Signed)
I did not call him

## 2019-11-02 ENCOUNTER — Ambulatory Visit: Payer: Medicaid Other | Admitting: Physical Therapy

## 2019-11-02 NOTE — Telephone Encounter (Signed)
I did not call him

## 2019-11-05 ENCOUNTER — Telehealth: Payer: Self-pay | Admitting: Pharmacist

## 2019-11-05 ENCOUNTER — Other Ambulatory Visit (HOSPITAL_COMMUNITY): Payer: Self-pay

## 2019-11-05 ENCOUNTER — Encounter: Payer: Medicaid Other | Admitting: Physical Therapy

## 2019-11-05 MED ORDER — NICOTINE 21 MG/24HR TD PT24: 21.0000 mg | MEDICATED_PATCH | Freq: Every day | TRANSDERMAL | 2 refills | Status: DC

## 2019-11-05 NOTE — Progress Notes (Signed)
Paramedicine Encounter    Patient ID: Russell Collier, male    DOB: 06-06-1960, 59 y.o.   MRN: [id number]   Patient Care Team: [name], MD as PCP - General (Family Medicine) [name], MD as PCP - Cardiology (Cardiology) Michael Boston, MD as Consulting Physician (General Surgery) Wilford Corner, MD as Consulting Physician (Gastroenterology) Jorge Ny, LCSW as Social Worker (Licensed Clinical Social Worker) Alda Berthold, DO as Consulting Physician (Neurology) Jola Baptist, [place] as Referring Physician (Chiropractic Medicine)  Patient Active Problem List   Diagnosis Date Noted  . Mild epistaxis 10/30/2019  . Lower extremity edema   . 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 Acute respiratory failure ([place]) 07/27/2019  . Breast tenderness in male 05/31/2019  . Chronic pain 05/31/2019  . AKI (acute kidney injury) ([place]) 05/31/2019  . Pain due to onychomycosis of toenails of both feet 05/16/2019  . Chronic knee pain 04/16/2019  . Diabetes ([place]) 04/16/2019  . Esophageal reflux 02/06/2019  . Heartburn 02/06/2019  . Chronic skin ulcer of lower leg ([place]) 12/21/2018  . S/P drug eluting coronary stent placement   . Coronary artery disease involving native coronary artery of native heart with unstable angina pectoris ([place]) 12/20/2018  . Unstable angina ([place])   . 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 Acute on chronic diastolic heart failure ([place])   . Shortness of breath   . Precordial chest pain   . Idiopathic peripheral neuropathy   . Palpitations   . Chronic diastolic heart failure ([place])   . Abnormal ankle brachial index (ABI)   . Chest pain, rule out acute myocardial infarction 09/26/2018  . Dyspepsia   . Morbid obesity ([place])   . OSA on CPAP   . Chest pain 05/30/2018  . CAD (coronary artery disease) 03/30/2018  . Elevated troponin 02/03/2018  . Positive urine drug screen 02/03/2018  . Tobacco abuse 02/03/2018  . Hyperlipidemia 02/03/2018  . Acute respiratory failure  with hypoxia ([place]) 02/02/2018  . Acute congestive heart failure ([place])   . Atypical chest pain 12/27/2015  . Essential hypertension   . Neuropathy     Current Outpatient Medications:  .  Accu-Chek Softclix Lancets lancets, Use as instructed, Disp: 100 each, Rfl: 12 .  acetaminophen (TYLENOL) 650 MG CR tablet, Take 1 tablet (650 mg total) by mouth every 8 (eight) hours as needed for pain., Disp: 270 tablet, Rfl: 0 .  albuterol (PROVENTIL) (2.5 MG/3ML) 0.083% nebulizer solution, Take 2.5 mg by nebulization every 6 (six) hours as needed for wheezing or shortness of breath., Disp: , Rfl:  .  albuterol (VENTOLIN HFA) 108 (90 Base) MCG/ACT inhaler, Inhale 2 puffs into the lungs every 6 (six) hours as needed for wheezing or shortness of breath., Disp: , Rfl:  .  apixaban (ELIQUIS) 5 MG TABS tablet, Take 1 tablet (5 mg total) by mouth 2 (two) times daily., Disp: 60 tablet, Rfl: 11 .  atorvastatin (LIPITOR) 80 MG tablet, Take 1 tablet (80 mg total) by mouth daily., Disp: 90 tablet, Rfl: 3 .  baclofen (LIORESAL) 10 MG tablet, Take 5-10 mg by mouth 3 (three) times daily as needed. (Patient not taking: Reported on 09/10/2019), Disp: , Rfl:  .  Blood Glucose Monitoring Suppl (ACCU-CHEK GUIDE) w/Device KIT, 1 Units by Does not apply route daily., Disp: 1 kit, Rfl: 0 .  clopidogrel (PLAVIX) 75 MG tablet, Take 1 tablet (75 mg total) by mouth daily with breakfast., Disp: 90 tablet, Rfl: 3 .  dapagliflozin propanediol (FARXIGA) 10 MG TABS  tablet, Take 10 mg by mouth daily before breakfast., Disp: 30 tablet, Rfl: 6 .  diazepam (VALIUM) 5 MG tablet, Take 1 by mouth 1 hour  pre-procedure with very light food. May bring 2nd tablet to appointment. (Patient not taking: Reported on 10/22/2019), Disp: 2 tablet, Rfl: 0 .  enalapril (VASOTEC) 20 MG tablet, Take 20 mg by mouth at bedtime., Disp: , Rfl:  .  fluticasone (FLONASE) 50 MCG/ACT nasal spray, PLACE 1 SPRAY INTO BOTH NOSTRILS DAILY., Disp: 16 g, Rfl: 11 .  furosemide  (LASIX) 40 MG tablet, Take 1 tablet (40 mg total) by mouth 2 (two) times daily., Disp: 180 tablet, Rfl: 3 .  glucose blood (ACCU-CHEK GUIDE) test strip, Use as instructed, Disp: 100 each, Rfl: 12 .  isosorbide mononitrate (IMDUR) 30 MG 24 hr tablet, Take 1 tablet (30 mg total) by mouth daily., Disp: 90 tablet, Rfl: 3 .  lidocaine (LIDODERM) 5 %, Place 1 patch onto the skin daily. Remove & Discard patch within 12 hours or as directed by MD (Patient not taking: Reported on 10/08/2019), Disp: 5 patch, Rfl: 0 .  Multiple Vitamin (MULTIVITAMIN WITH MINERALS) TABS tablet, Take 1 tablet by mouth daily., Disp: , Rfl:  .  nicotine (NICODERM CQ - DOSED IN MG/24 HOURS) 21 mg/24hr patch, Place 1 patch (21 mg total) onto the skin daily., Disp: 28 patch, Rfl: 2 .  oxymetazoline (AFRIN NASAL SPRAY) 0.05 % nasal spray, Place 2 sprays into both nostrils daily. Only use when having bleeding., Disp: 30 mL, Rfl: 0 .  pantoprazole (PROTONIX) 40 MG tablet, Take 1 tablet (40 mg total) by mouth daily., Disp: 90 tablet, Rfl: 0 .  potassium chloride SA (KLOR-CON) 20 MEQ tablet, TAKE 1 TABLET(20 MEQ) BY MOUTH DAILY (Patient taking differently: Take 20 mEq by mouth daily. TAKE 1 TABLET(20 MEQ) BY MOUTH DAILY), Disp: 90 tablet, Rfl: 3 .  pregabalin (LYRICA) 100 MG capsule, Take 1 tablet three times daily (Patient taking differently: Take 100 mg by mouth 3 (three) times daily. Take 1 tablet three times daily), Disp: 270 capsule, Rfl: 3 .  Tafamidis (VYNDAMAX) 61 MG CAPS, Take 61 mg by mouth daily., Disp: , Rfl:  .  umeclidinium-vilanterol (ANORO ELLIPTA) 62.5-25 MCG/INH AEPB, Inhale 1 puff into the lungs daily., Disp: 60 each, Rfl: 2 .  valACYclovir (VALTREX) 500 MG tablet, Take 1 tablet (500 mg total) by mouth daily., Disp: 90 tablet, Rfl: 3 Allergies  Allergen Reactions  . Varenicline Other (See Comments)    Patient reports laryngospasm stopped in the ER  . Bupropion Other (See Comments)    Headache - moderate/severe - self  discontinued agent      Social History   Socioeconomic History  . Marital status: Divorced    Spouse name: Not on file  . Number of children: 2  . Years of education: 10  . Highest education level: Not on file  Occupational History  . Occupation: not employed  Tobacco Use  . Smoking status: Current Every Day Smoker    Packs/day: 0.50    Years: 54.00    Pack years: 27.00    Types: Cigarettes, Cigars  . Smokeless tobacco: Never Used  . Tobacco comment: half pack daily  Vaping Use  . Vaping Use: Never used  Substance and Sexual Activity  . Alcohol use: Yes    Alcohol/week: 4.0 standard drinks    Types: 2 Cans of beer, 2 Shots of liquor per week  . Drug use: Not Currently    Comment:  last use may last year  . Sexual activity: Yes  Other Topics Concern  . Not on file  Social History Narrative   Right handed   Two story home   No caffeine   Social Determinants of Health   Financial Resource Strain: Low Risk   . Difficulty of Paying Living Expenses: Not very hard  Food Insecurity: No Food Insecurity  . Worried About Charity fundraiser in the Last Year: Never true  . Ran Out of Food in the Last Year: Never true  Transportation Needs: No Transportation Needs  . Lack of Transportation (Medical): No  . Lack of Transportation (Non-Medical): No  Physical Activity:   . Days of Exercise per Week: Not on file  . Minutes of Exercise per Session: Not on file  Stress:   . Feeling of Stress : Not on file  Social Connections:   . Frequency of Communication with Friends and Family: Not on file  . Frequency of Social Gatherings with Friends and Family: Not on file  . Attends Religious Services: Not on file  . Active Member of Clubs or Organizations: Not on file  . Attends Archivist Meetings: Not on file  . Marital Status: Not on file  Intimate Partner Violence:   . Fear of Current or Ex-Partner: Not on file  . Emotionally Abused: Not on file  . Physically Abused:  Not on file  . Sexually Abused: Not on file    Physical Exam Vitals reviewed.  Constitutional:      Appearance: He is normal weight.  HENT:     Head: Normocephalic.     Nose: Nose normal.     Mouth/Throat:     Mouth: Mucous membranes are moist.     Pharynx: Oropharynx is clear.  Eyes:     Pupils: Pupils are equal, round, and reactive to light.  Cardiovascular:     Rate and Rhythm: Regular rhythm. Tachycardia present.     Pulses: Normal pulses.     Heart sounds: Normal heart sounds.  Pulmonary:     Effort: Pulmonary effort is normal.     Breath sounds: Normal breath sounds.  Abdominal:     Palpations: Abdomen is soft.  Musculoskeletal:        General: Normal range of motion.     Cervical back: Normal range of motion.     Right lower leg: No edema.     Left lower leg: No edema.  Skin:    General: Skin is warm and dry.     Capillary Refill: Capillary refill takes less than 2 seconds.  Neurological:     Mental Status: He is alert. Mental status is at baseline.  Psychiatric:        Mood and Affect: Mood normal.     Arrived for home visit for Joevanni who was seated inside his home alert and oriented reporting he was feeling good and denied any recent nose bleeds after reducing usage of ibuprofen. Sidharth was compliant with medications over the last two weeks. Meds were verified and confirmed. Pill box filled accordingly. Ayub reports he has not been eating properly over the last few days. Weight down a few lbs. Vitals were obtained as noted. Bryor agreed to visit in one week. Appointments reviewed. Home visit complete.   Refills:  -Inhalers -Farxiga -Valtrex   Patient reports he is now using K2+D3 daily vitamin.       Future Appointments  Date Time Provider [place]  11/22/2019  1:30 PM Magnus Sinning, MD [facility] None  12/11/2019  3:30 PM Gardiner Barefoot, DPM [facility]     ACTION: Home visit completed Next visit planned for one  week

## 2019-11-05 NOTE — Telephone Encounter (Signed)
Patient contacted for follow-up of tobacco cessation.    Patient admits to smoking 15-20 cigs per day recently.  His plan had been to decrease to 7 cigarettes per day.   He expressed interest and suggested that he should restart 21mg  nicotine patches.  I agreed with that plan and agreed to send new prescription to his pharmacy.  He acknowledged that he understood all patient instructions.   I will plan to follow-up in 1 week per patient request.  I continue to encourage his "change talk".

## 2019-11-07 ENCOUNTER — Encounter: Payer: Medicaid Other | Admitting: Physical Therapy

## 2019-11-12 ENCOUNTER — Encounter: Payer: Medicaid Other | Admitting: Physical Therapy

## 2019-11-12 ENCOUNTER — Other Ambulatory Visit (HOSPITAL_COMMUNITY): Payer: Self-pay

## 2019-11-12 NOTE — Progress Notes (Signed)
Paramedicine Encounter    Patient ID: Russell Collier, male    DOB: 1961-01-18, 59 y.o.   MRN: [id number]   Patient Care Team: [name], MD as PCP - General (Family Medicine) [name], MD as PCP - Cardiology (Cardiology) Michael Boston, MD as Consulting Physician (General Surgery) Wilford Corner, MD as Consulting Physician (Gastroenterology) Jorge Ny, LCSW as Social Worker (Licensed Clinical Social Worker) Alda Berthold, DO as Consulting Physician (Neurology) Jola Baptist, [place] as Referring Physician (Chiropractic Medicine)  Patient Active Problem List   Diagnosis Date Noted  . Mild epistaxis 10/30/2019  . Lower extremity edema   . 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 Acute respiratory failure ([place]) 07/27/2019  . Breast tenderness in male 05/31/2019  . Chronic pain 05/31/2019  . AKI (acute kidney injury) ([place]) 05/31/2019  . Pain due to onychomycosis of toenails of both feet 05/16/2019  . Chronic knee pain 04/16/2019  . Diabetes ([place]) 04/16/2019  . Esophageal reflux 02/06/2019  . Heartburn 02/06/2019  . Chronic skin ulcer of lower leg ([place]) 12/21/2018  . S/P drug eluting coronary stent placement   . Coronary artery disease involving native coronary artery of native heart with unstable angina pectoris ([place]) 12/20/2018  . Unstable angina ([place])   . 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 Acute on chronic diastolic heart failure ([place])   . Shortness of breath   . Precordial chest pain   . Idiopathic peripheral neuropathy   . Palpitations   . Chronic diastolic heart failure ([place])   . Abnormal ankle brachial index (ABI)   . Chest pain, rule out acute myocardial infarction 09/26/2018  . Dyspepsia   . Morbid obesity ([place])   . OSA on CPAP   . Chest pain 05/30/2018  . CAD (coronary artery disease) 03/30/2018  . Elevated troponin 02/03/2018  . Positive urine drug screen 02/03/2018  . Tobacco abuse 02/03/2018  . Hyperlipidemia 02/03/2018  . Acute respiratory failure  with hypoxia ([place]) 02/02/2018  . Acute congestive heart failure ([place])   . Atypical chest pain 12/27/2015  . Essential hypertension   . Neuropathy     Current Outpatient Medications:  .  Accu-Chek Softclix Lancets lancets, Use as instructed, Disp: 100 each, Rfl: 12 .  acetaminophen (TYLENOL) 650 MG CR tablet, Take 1 tablet (650 mg total) by mouth every 8 (eight) hours as needed for pain., Disp: 270 tablet, Rfl: 0 .  albuterol (PROVENTIL) (2.5 MG/3ML) 0.083% nebulizer solution, Take 2.5 mg by nebulization every 6 (six) hours as needed for wheezing or shortness of breath., Disp: , Rfl:  .  albuterol (VENTOLIN HFA) 108 (90 Base) MCG/ACT inhaler, Inhale 2 puffs into the lungs every 6 (six) hours as needed for wheezing or shortness of breath., Disp: , Rfl:  .  apixaban (ELIQUIS) 5 MG TABS tablet, Take 1 tablet (5 mg total) by mouth 2 (two) times daily., Disp: 60 tablet, Rfl: 11 .  atorvastatin (LIPITOR) 80 MG tablet, Take 1 tablet (80 mg total) by mouth daily., Disp: 90 tablet, Rfl: 3 .  baclofen (LIORESAL) 10 MG tablet, Take 5-10 mg by mouth 3 (three) times daily as needed. (Patient not taking: Reported on 09/10/2019), Disp: , Rfl:  .  Blood Glucose Monitoring Suppl (ACCU-CHEK GUIDE) w/Device KIT, 1 Units by Does not apply route daily., Disp: 1 kit, Rfl: 0 .  clopidogrel (PLAVIX) 75 MG tablet, Take 1 tablet (75 mg total) by mouth daily with breakfast., Disp: 90 tablet, Rfl: 3 .  dapagliflozin propanediol (FARXIGA) 10 MG TABS  tablet, Take 10 mg by mouth daily before breakfast., Disp: 30 tablet, Rfl: 6 .  diazepam (VALIUM) 5 MG tablet, Take 1 by mouth 1 hour  pre-procedure with very light food. May bring 2nd tablet to appointment. (Patient not taking: Reported on 10/22/2019), Disp: 2 tablet, Rfl: 0 .  enalapril (VASOTEC) 20 MG tablet, Take 20 mg by mouth at bedtime., Disp: , Rfl:  .  fluticasone (FLONASE) 50 MCG/ACT nasal spray, PLACE 1 SPRAY INTO BOTH NOSTRILS DAILY., Disp: 16 g, Rfl: 11 .  furosemide  (LASIX) 40 MG tablet, Take 1 tablet (40 mg total) by mouth 2 (two) times daily., Disp: 180 tablet, Rfl: 3 .  glucose blood (ACCU-CHEK GUIDE) test strip, Use as instructed, Disp: 100 each, Rfl: 12 .  isosorbide mononitrate (IMDUR) 30 MG 24 hr tablet, Take 1 tablet (30 mg total) by mouth daily., Disp: 90 tablet, Rfl: 3 .  lidocaine (LIDODERM) 5 %, Place 1 patch onto the skin daily. Remove & Discard patch within 12 hours or as directed by MD (Patient not taking: Reported on 10/08/2019), Disp: 5 patch, Rfl: 0 .  Multiple Vitamin (MULTIVITAMIN WITH MINERALS) TABS tablet, Take 1 tablet by mouth daily., Disp: , Rfl:  .  nicotine (NICODERM CQ - DOSED IN MG/24 HOURS) 21 mg/24hr patch, Place 1 patch (21 mg total) onto the skin daily., Disp: 28 patch, Rfl: 2 .  oxymetazoline (AFRIN NASAL SPRAY) 0.05 % nasal spray, Place 2 sprays into both nostrils daily. Only use when having bleeding. (Patient not taking: Reported on 11/05/2019), Disp: 30 mL, Rfl: 0 .  pantoprazole (PROTONIX) 40 MG tablet, Take 1 tablet (40 mg total) by mouth daily., Disp: 90 tablet, Rfl: 0 .  potassium chloride SA (KLOR-CON) 20 MEQ tablet, TAKE 1 TABLET(20 MEQ) BY MOUTH DAILY (Patient taking differently: Take 20 mEq by mouth daily. TAKE 1 TABLET(20 MEQ) BY MOUTH DAILY), Disp: 90 tablet, Rfl: 3 .  pregabalin (LYRICA) 100 MG capsule, Take 1 tablet three times daily (Patient taking differently: Take 100 mg by mouth 3 (three) times daily. Take 1 tablet three times daily), Disp: 270 capsule, Rfl: 3 .  Tafamidis (VYNDAMAX) 61 MG CAPS, Take 61 mg by mouth daily., Disp: , Rfl:  .  umeclidinium-vilanterol (ANORO ELLIPTA) 62.5-25 MCG/INH AEPB, Inhale 1 puff into the lungs daily., Disp: 60 each, Rfl: 2 .  valACYclovir (VALTREX) 500 MG tablet, Take 1 tablet (500 mg total) by mouth daily., Disp: 90 tablet, Rfl: 3 Allergies  Allergen Reactions  . Varenicline Other (See Comments)    Patient reports laryngospasm stopped in the ER  . Bupropion Other (See  Comments)    Headache - moderate/severe - self discontinued agent      Social History   Socioeconomic History  . Marital status: Divorced    Spouse name: Not on file  . Number of children: 2  . Years of education: 10  . Highest education level: Not on file  Occupational History  . Occupation: not employed  Tobacco Use  . Smoking status: Current Every Day Smoker    Packs/day: 0.50    Years: 54.00    Pack years: 27.00    Types: Cigarettes, Cigars  . Smokeless tobacco: Never Used  . Tobacco comment: half pack daily  Vaping Use  . Vaping Use: Never used  Substance and Sexual Activity  . Alcohol use: Yes    Alcohol/week: 4.0 standard drinks    Types: 2 Cans of beer, 2 Shots of liquor per week  . Drug use:  Not Currently    Comment: last use may last year  . Sexual activity: Yes  Other Topics Concern  . Not on file  Social History Narrative   Right handed   Two story home   No caffeine   Social Determinants of Health   Financial Resource Strain: Low Risk   . Difficulty of Paying Living Expenses: Not very hard  Food Insecurity: No Food Insecurity  . Worried About Programme researcher, broadcasting/film/video in the Last Year: Never true  . Ran Out of Food in the Last Year: Never true  Transportation Needs: No Transportation Needs  . Lack of Transportation (Medical): No  . Lack of Transportation (Non-Medical): No  Physical Activity:   . Days of Exercise per Week: Not on file  . Minutes of Exercise per Session: Not on file  Stress:   . Feeling of Stress : Not on file  Social Connections:   . Frequency of Communication with Friends and Family: Not on file  . Frequency of Social Gatherings with Friends and Family: Not on file  . Attends Religious Services: Not on file  . Active Member of Clubs or Organizations: Not on file  . Attends Banker Meetings: Not on file  . Marital Status: Not on file  Intimate Partner Violence:   . Fear of Current or Ex-Partner: Not on file  .  Emotionally Abused: Not on file  . Physically Abused: Not on file  . Sexually Abused: Not on file    Physical Exam Vitals reviewed.  Constitutional:      Appearance: He is normal weight.  HENT:     Head: Normocephalic.     Nose: Nose normal.     Mouth/Throat:     Mouth: Mucous membranes are moist.     Pharynx: Oropharynx is clear.  Eyes:     Pupils: Pupils are equal, round, and reactive to light.  Cardiovascular:     Rate and Rhythm: Normal rate and regular rhythm.     Pulses: Normal pulses.     Heart sounds: Normal heart sounds.  Pulmonary:     Effort: Pulmonary effort is normal.     Breath sounds: Normal breath sounds.  Abdominal:     Palpations: Abdomen is soft.  Musculoskeletal:        General: Normal range of motion.     Cervical back: Normal range of motion.     Right lower leg: No edema.     Left lower leg: Edema present.  Skin:    General: Skin is warm and dry.     Capillary Refill: Capillary refill takes less than 2 seconds.  Neurological:     Mental Status: He is alert. Mental status is at baseline.  Psychiatric:        Mood and Affect: Mood normal.      Arrived for home visit for Russell Collier who was alert and oriented seated at home reporting he was feeling okay but continues to have frequent nose bleeds and has not yet received Afrin. I reminded patient he can use OTC and he verbalized understanding. Medications reviewed, confirmed. Pill box filled accordingly. Lower right leg edema noted. Patient admits to eating multiple meals out in fast food restaurants. Russell Collier was instructed on proper dietary needs. Russell Collier verbalized understanding. Russell Collier agreed to be compliant with all meds and to weight himself daily and we will re-evaluate next week. Home visit complete.    Refills- Marcelline Deist, Valtrex   Future Appointments  Date Time Provider  [place]  11/22/2019  1:30 PM Magnus Sinning, MD [facility] None  12/11/2019  3:30 PM Gardiner Barefoot, DPM [facility]     ACTION: Home visit completed Next visit planned for one weeks

## 2019-11-14 ENCOUNTER — Encounter: Payer: Medicaid Other | Admitting: Physical Therapy

## 2019-11-19 ENCOUNTER — Other Ambulatory Visit (HOSPITAL_COMMUNITY): Payer: Self-pay

## 2019-11-19 ENCOUNTER — Other Ambulatory Visit (HOSPITAL_COMMUNITY): Payer: Self-pay | Admitting: *Deleted

## 2019-11-19 MED ORDER — APIXABAN 5 MG PO TABS: 5.0000 mg | ORAL_TABLET | Freq: Two times a day (BID) | ORAL | 11 refills | Status: DC

## 2019-11-19 MED ORDER — DAPAGLIFLOZIN PROPANEDIOL 10 MG PO TABS: 10.0000 mg | ORAL_TABLET | Freq: Every day | ORAL | 6 refills | Status: DC

## 2019-11-19 MED FILL — VYNDAMAX 61 MG CAPS: 61 | 30 days supply | Qty: 30 | Fill #12

## 2019-11-19 NOTE — Progress Notes (Signed)
Paramedicine Encounter    Patient ID: Russell Collier, male    DOB: 1961-01-18, 59 y.o.   MRN: [id number]   Patient Care Team: [name], MD as PCP - General (Family Medicine) [name], MD as PCP - Cardiology (Cardiology) Michael Boston, MD as Consulting Physician (General Surgery) Wilford Corner, MD as Consulting Physician (Gastroenterology) Jorge Ny, LCSW as Social Worker (Licensed Clinical Social Worker) Alda Berthold, DO as Consulting Physician (Neurology) Jola Baptist, [place] as Referring Physician (Chiropractic Medicine)  Patient Active Problem List   Diagnosis Date Noted  . Mild epistaxis 10/30/2019  . Lower extremity edema   . 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 Acute respiratory failure ([place]) 07/27/2019  . Breast tenderness in male 05/31/2019  . Chronic pain 05/31/2019  . AKI (acute kidney injury) ([place]) 05/31/2019  . Pain due to onychomycosis of toenails of both feet 05/16/2019  . Chronic knee pain 04/16/2019  . Diabetes ([place]) 04/16/2019  . Esophageal reflux 02/06/2019  . Heartburn 02/06/2019  . Chronic skin ulcer of lower leg ([place]) 12/21/2018  . S/P drug eluting coronary stent placement   . Coronary artery disease involving native coronary artery of native heart with unstable angina pectoris ([place]) 12/20/2018  . Unstable angina ([place])   . 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 Acute on chronic diastolic heart failure ([place])   . Shortness of breath   . Precordial chest pain   . Idiopathic peripheral neuropathy   . Palpitations   . Chronic diastolic heart failure ([place])   . Abnormal ankle brachial index (ABI)   . Chest pain, rule out acute myocardial infarction 09/26/2018  . Dyspepsia   . Morbid obesity ([place])   . OSA on CPAP   . Chest pain 05/30/2018  . CAD (coronary artery disease) 03/30/2018  . Elevated troponin 02/03/2018  . Positive urine drug screen 02/03/2018  . Tobacco abuse 02/03/2018  . Hyperlipidemia 02/03/2018  . Acute respiratory failure  with hypoxia ([place]) 02/02/2018  . Acute congestive heart failure ([place])   . Atypical chest pain 12/27/2015  . Essential hypertension   . Neuropathy     Current Outpatient Medications:  .  Accu-Chek Softclix Lancets lancets, Use as instructed, Disp: 100 each, Rfl: 12 .  acetaminophen (TYLENOL) 650 MG CR tablet, Take 1 tablet (650 mg total) by mouth every 8 (eight) hours as needed for pain., Disp: 270 tablet, Rfl: 0 .  albuterol (PROVENTIL) (2.5 MG/3ML) 0.083% nebulizer solution, Take 2.5 mg by nebulization every 6 (six) hours as needed for wheezing or shortness of breath., Disp: , Rfl:  .  albuterol (VENTOLIN HFA) 108 (90 Base) MCG/ACT inhaler, Inhale 2 puffs into the lungs every 6 (six) hours as needed for wheezing or shortness of breath., Disp: , Rfl:  .  apixaban (ELIQUIS) 5 MG TABS tablet, Take 1 tablet (5 mg total) by mouth 2 (two) times daily., Disp: 60 tablet, Rfl: 11 .  atorvastatin (LIPITOR) 80 MG tablet, Take 1 tablet (80 mg total) by mouth daily., Disp: 90 tablet, Rfl: 3 .  baclofen (LIORESAL) 10 MG tablet, Take 5-10 mg by mouth 3 (three) times daily as needed. (Patient not taking: Reported on 09/10/2019), Disp: , Rfl:  .  Blood Glucose Monitoring Suppl (ACCU-CHEK GUIDE) w/Device KIT, 1 Units by Does not apply route daily., Disp: 1 kit, Rfl: 0 .  clopidogrel (PLAVIX) 75 MG tablet, Take 1 tablet (75 mg total) by mouth daily with breakfast., Disp: 90 tablet, Rfl: 3 .  dapagliflozin propanediol (FARXIGA) 10 MG TABS  tablet, Take 10 mg by mouth daily before breakfast., Disp: 30 tablet, Rfl: 6 .  diazepam (VALIUM) 5 MG tablet, Take 1 by mouth 1 hour  pre-procedure with very light food. May bring 2nd tablet to appointment. (Patient not taking: Reported on 10/22/2019), Disp: 2 tablet, Rfl: 0 .  enalapril (VASOTEC) 20 MG tablet, Take 20 mg by mouth at bedtime., Disp: , Rfl:  .  fluticasone (FLONASE) 50 MCG/ACT nasal spray, PLACE 1 SPRAY INTO BOTH NOSTRILS DAILY., Disp: 16 g, Rfl: 11 .  furosemide  (LASIX) 40 MG tablet, Take 1 tablet (40 mg total) by mouth 2 (two) times daily., Disp: 180 tablet, Rfl: 3 .  glucose blood (ACCU-CHEK GUIDE) test strip, Use as instructed, Disp: 100 each, Rfl: 12 .  isosorbide mononitrate (IMDUR) 30 MG 24 hr tablet, Take 1 tablet (30 mg total) by mouth daily., Disp: 90 tablet, Rfl: 3 .  lidocaine (LIDODERM) 5 %, Place 1 patch onto the skin daily. Remove & Discard patch within 12 hours or as directed by MD (Patient not taking: Reported on 10/08/2019), Disp: 5 patch, Rfl: 0 .  Multiple Vitamin (MULTIVITAMIN WITH MINERALS) TABS tablet, Take 1 tablet by mouth daily., Disp: , Rfl:  .  nicotine (NICODERM CQ - DOSED IN MG/24 HOURS) 21 mg/24hr patch, Place 1 patch (21 mg total) onto the skin daily., Disp: 28 patch, Rfl: 2 .  oxymetazoline (AFRIN NASAL SPRAY) 0.05 % nasal spray, Place 2 sprays into both nostrils daily. Only use when having bleeding. (Patient not taking: Reported on 11/05/2019), Disp: 30 mL, Rfl: 0 .  pantoprazole (PROTONIX) 40 MG tablet, Take 1 tablet (40 mg total) by mouth daily., Disp: 90 tablet, Rfl: 0 .  potassium chloride SA (KLOR-CON) 20 MEQ tablet, TAKE 1 TABLET(20 MEQ) BY MOUTH DAILY (Patient taking differently: Take 20 mEq by mouth daily. TAKE 1 TABLET(20 MEQ) BY MOUTH DAILY), Disp: 90 tablet, Rfl: 3 .  pregabalin (LYRICA) 100 MG capsule, Take 1 tablet three times daily (Patient taking differently: Take 100 mg by mouth 3 (three) times daily. Take 1 tablet three times daily), Disp: 270 capsule, Rfl: 3 .  Tafamidis (VYNDAMAX) 61 MG CAPS, Take 61 mg by mouth daily., Disp: , Rfl:  .  umeclidinium-vilanterol (ANORO ELLIPTA) 62.5-25 MCG/INH AEPB, Inhale 1 puff into the lungs daily., Disp: 60 each, Rfl: 2 .  valACYclovir (VALTREX) 500 MG tablet, Take 1 tablet (500 mg total) by mouth daily. (Patient not taking: Reported on 11/12/2019), Disp: 90 tablet, Rfl: 3 Allergies  Allergen Reactions  . Varenicline Other (See Comments)    Patient reports laryngospasm  stopped in the ER  . Bupropion Other (See Comments)    Headache - moderate/severe - self discontinued agent      Social History   Socioeconomic History  . Marital status: Divorced    Spouse name: Not on file  . Number of children: 2  . Years of education: 10  . Highest education level: Not on file  Occupational History  . Occupation: not employed  Tobacco Use  . Smoking status: Current Every Day Smoker    Packs/day: 0.50    Years: 54.00    Pack years: 27.00    Types: Cigarettes, Cigars  . Smokeless tobacco: Never Used  . Tobacco comment: half pack daily  Vaping Use  . Vaping Use: Never used  Substance and Sexual Activity  . Alcohol use: Yes    Alcohol/week: 4.0 standard drinks    Types: 2 Cans of beer, 2 Shots of liquor  per week  . Drug use: Not Currently    Comment: last use may last year  . Sexual activity: Yes  Other Topics Concern  . Not on file  Social History Narrative   Right handed   Two story home   No caffeine   Social Determinants of Health   Financial Resource Strain: Low Risk   . Difficulty of Paying Living Expenses: Not very hard  Food Insecurity: No Food Insecurity  . Worried About Charity fundraiser in the Last Year: Never true  . Ran Out of Food in the Last Year: Never true  Transportation Needs: No Transportation Needs  . Lack of Transportation (Medical): No  . Lack of Transportation (Non-Medical): No  Physical Activity:   . Days of Exercise per Week: Not on file  . Minutes of Exercise per Session: Not on file  Stress:   . Feeling of Stress : Not on file  Social Connections:   . Frequency of Communication with Friends and Family: Not on file  . Frequency of Social Gatherings with Friends and Family: Not on file  . Attends Religious Services: Not on file  . Active Member of Clubs or Organizations: Not on file  . Attends Archivist Meetings: Not on file  . Marital Status: Not on file  Intimate Partner Violence:   . Fear of  Current or Ex-Partner: Not on file  . Emotionally Abused: Not on file  . Physically Abused: Not on file  . Sexually Abused: Not on file    Physical Exam Vitals reviewed.  Constitutional:      Appearance: He is normal weight.  HENT:     Head: Normocephalic.     Nose: Nose normal.     Mouth/Throat:     Mouth: Mucous membranes are moist.     Pharynx: Oropharynx is clear.  Eyes:     Pupils: Pupils are equal, round, and reactive to light.  Cardiovascular:     Rate and Rhythm: Normal rate and regular rhythm.     Pulses: Normal pulses.     Heart sounds: Normal heart sounds.  Pulmonary:     Effort: Pulmonary effort is normal.     Breath sounds: Normal breath sounds.  Abdominal:     Palpations: Abdomen is soft.  Musculoskeletal:        General: Normal range of motion.     Cervical back: Normal range of motion.     Right lower leg: No edema.     Left lower leg: No edema.  Skin:    General: Skin is warm and dry.     Capillary Refill: Capillary refill takes less than 2 seconds.  Neurological:     Mental Status: He is alert. Mental status is at baseline.  Psychiatric:        Mood and Affect: Mood normal.     Arrived for home visit for Hil[place] who was alert and oriented seated at his kitchen table. Sharod reports feeling "pretty good" with no reports of chest pain, shortness of breath, feeling faint or trouble sleeping. Windell mentioned he injured his foot over the weekend by stumping his toes on a concrete block. He reports they are bruised, I examined and noted to have some bruising with no swelling and full ROM. Vitals were obtained and as noted. Anand was compliant with his medications over the last week. Medications were verified and confirmed. Pill box filled accordingly. Brace agreed to visit in one week. Reminder of appointments given.  Home visit complete.   Refills- Eliquis Plavix Pregabalin     Future Appointments  Date Time Provider [place]  11/22/2019   1:30 PM Magnus Sinning, MD [facility] None  12/11/2019  3:30 PM Gardiner Barefoot, DPM [facility]  01/31/2020  3:00 PM [name], [name], MD [facility] None     ACTION: Home visit completed Next visit planned for one week

## 2019-11-20 ENCOUNTER — Telehealth: Payer: Self-pay | Admitting: Pharmacist

## 2019-11-20 NOTE — Telephone Encounter (Signed)
Attempted to contact RE tobacco intake reduction.  No answer X2  Patient message that I would try again in two days.

## 2019-11-22 ENCOUNTER — Other Ambulatory Visit: Payer: Self-pay

## 2019-11-22 ENCOUNTER — Encounter: Payer: Self-pay | Admitting: Physical Medicine and Rehabilitation

## 2019-11-22 ENCOUNTER — Telehealth: Payer: Self-pay | Admitting: Pharmacist

## 2019-11-22 ENCOUNTER — Ambulatory Visit: Payer: Self-pay

## 2019-11-22 ENCOUNTER — Ambulatory Visit (INDEPENDENT_AMBULATORY_CARE_PROVIDER_SITE_OTHER): Payer: Medicaid Other | Admitting: Physical Medicine and Rehabilitation

## 2019-11-22 VITALS — BP 124/69 | HR 85

## 2019-11-22 DIAGNOSIS — M5416 Radiculopathy, lumbar region: Secondary | ICD-10-CM

## 2019-11-22 DIAGNOSIS — M4316 Spondylolisthesis, lumbar region: Secondary | ICD-10-CM

## 2019-11-22 MED ORDER — METHYLPREDNISOLONE ACETATE 80 MG/ML IJ SUSP
80.0000 mg | Freq: Once | INTRAMUSCULAR | Status: AC
  Administered 2019-11-22: 80 mg
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
## 2019-11-22 NOTE — Progress Notes (Signed)
Russell Neri, MD [facility]  Pt state lower back pain. Pt state walking and standing for a long period of time makes the pain worse. Pt state pain meds and laying across the bed makes helps ease the pain.  Numeric Pain Rating Scale and Functional Assessment Average Pain 9   In the last MONTH (on 0-10 scale) has pain interfered with the following?  1. General activity like being  able to carry out your everyday physical activities such as walking, climbing stairs, carrying groceries, or moving a chair?  Rating(9)   +Driver, +BT, -Dye Allergies.

## 2019-11-22 NOTE — Telephone Encounter (Signed)
Contacted to follow-up on tobacco cessation.   Patient answered the phone and stated he had family present following his back procedure today.   He reports the back procedure went well.   He admits to smoking 10-12 cigs per day.  He reports using nicotine patch consistently.   We briefly discussed a goal of reducing and making progress.  I remain hopeful that if the back pain diminishes, he may be more likely to make progress toward quitting.   Follow-up in 2-3 weeks.

## 2019-11-26 ENCOUNTER — Other Ambulatory Visit: Payer: Self-pay | Admitting: Family Medicine

## 2019-11-26 ENCOUNTER — Other Ambulatory Visit (HOSPITAL_COMMUNITY): Payer: Self-pay

## 2019-11-26 NOTE — Procedures (Signed)
Lumbosacral Transforaminal Epidural Steroid Injection - Sub-Pedicular Approach with Fluoroscopic Guidance  Patient: Russell Collier      Date of Birth: 16-Jan-1961 MRN: [id number] PCP: [name], MD      Visit Date: 11/22/2019   Universal Protocol:    Date/Time: 11/22/2019  Consent Given By: the patient  Position: PRONE  Additional Comments: Vital signs were monitored before and after the procedure. Patient was prepped and draped in the usual sterile fashion. The correct patient, procedure, and site was verified.   Injection Procedure Details:  Procedure Site One Meds Administered:  Meds ordered this encounter  Medications  . methylPREDNISolone acetate (DEPO-MEDROL) injection 80 mg    Laterality: Left  Location/Site:  L5-S1  Needle size: 22 G  Needle type: Spinal  Needle Placement: Transforaminal  Findings:    -Comments: Excellent flow of contrast along the nerve, nerve root and into the epidural space.  Procedure Details: After squaring off the end-plates to get a true AP view, the C-arm was positioned so that an oblique view of the foramen as noted above was visualized. The target area is just inferior to the "nose of the scotty dog" or sub pedicular. The soft tissues overlying this structure were infiltrated with 2-3 ml. of 1% Lidocaine without Epinephrine.  The spinal needle was inserted toward the target using a "trajectory" view along the fluoroscope beam.  Under AP and lateral visualization, the needle was advanced so it did not puncture dura and was located close the 6 O'Clock position of the pedical in AP tracterory. Biplanar projections were used to confirm position. Aspiration was confirmed to be negative for CSF and/or blood. A 1-2 ml. volume of Isovue-250 was injected and flow of contrast was noted at each level. Radiographs were obtained for documentation purposes.   After attaining the desired flow of contrast documented above, a 0.5 to 1.0 ml test  dose of 0.25% Marcaine was injected into each respective transforaminal space.  The patient was observed for 90 seconds post injection.  After no sensory deficits were reported, and normal lower extremity motor function was noted,   the above injectate was administered so that equal amounts of the injectate were placed at each foramen (level) into the transforaminal epidural space.   Additional Comments:  The patient tolerated the procedure well Dressing: 2 x 2 sterile gauze and Band-Aid    Post-procedure details: Patient was observed during the procedure. Post-procedure instructions were reviewed.  Patient left the clinic in stable condition.

## 2019-11-26 NOTE — Progress Notes (Signed)
Paramedicine Encounter    Patient ID: Russell Collier, male    DOB: [birth year], 59 y.o.   MRN: [id number]   Patient Care Team: [name], MD as PCP - General (Family Medicine) [name], MD as PCP - Cardiology (Cardiology) Michael Boston, MD as Consulting Physician (General Surgery) Wilford Corner, MD as Consulting Physician (Gastroenterology) Jorge Ny, LCSW as Social Worker (Licensed Clinical Social Worker) Alda Berthold, DO as Consulting Physician (Neurology) Jola Baptist, [place] as Referring Physician (Chiropractic Medicine)  Patient Active Problem List   Diagnosis Date Noted  . Mild epistaxis 10/30/2019  . Lower extremity edema   . 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 Acute respiratory failure ([place]) 07/27/2019  . Breast tenderness in male 05/31/2019  . Chronic pain 05/31/2019  . [name] 05/31/2019  . Pain due to onychomycosis of toenails of both feet 05/16/2019  . Chronic knee pain 04/16/2019  . [name] 04/16/2019  . Esophageal reflux 02/06/2019  . Heartburn 02/06/2019  . [name] 12/21/2018  . S/P drug eluting coronary stent placement   . Coronary artery disease involving native coronary artery of native heart with unstable angina pectoris ([place]) 12/20/2018  . Unstable angina ([place])   . 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 Acute on chronic diastolic heart failure ([place])   . Shortness of breath   . Precordial chest pain   . Idiopathic peripheral neuropathy   . Palpitations   . [name]   . Abnormal ankle brachial index (ABI)   . Chest pain, rule out acute myocardial infarction 09/26/2018  . Dyspepsia   . [name]   . OSA on CPAP   . Chest pain 05/30/2018  . CAD (coronary artery disease) 03/30/2018  . Elevated troponin 02/03/2018  . Positive urine drug screen 02/03/2018  . Tobacco abuse 02/03/2018  . Hyperlipidemia 02/03/2018  . [name] 02/02/2018  . [name]   . Atypical chest pain 12/27/2015  . Essential hypertension   . Neuropathy     Current Outpatient Medications:  .  Accu-Chek Softclix Lancets lancets, Use as instructed, Disp: 100 each, Rfl: 12 .  acetaminophen (TYLENOL) 650 MG CR tablet, Take 1 tablet (650 mg total) by mouth every 8 (eight) hours as needed for pain., Disp: 270 tablet, Rfl: 0 .  albuterol (PROVENTIL) (2.5 MG/3ML) 0.083% nebulizer solution, Take 2.5 mg by nebulization every 6 (six) hours as needed for wheezing or shortness of breath., Disp: , Rfl:  .  albuterol (VENTOLIN HFA) 108 (90 Base) MCG/ACT inhaler, Inhale 2 puffs into the lungs every 6 (six) hours as needed for wheezing or shortness of breath., Disp: , Rfl:  .  apixaban (ELIQUIS) 5 MG TABS tablet, Take 1 tablet (5 mg total) by mouth 2 (two) times daily., Disp: 60 tablet, Rfl: 11 .  atorvastatin (LIPITOR) 80 MG tablet, Take 1 tablet (80 mg total) by mouth daily., Disp: 90 tablet, Rfl: 3 .  baclofen (LIORESAL) 10 MG tablet, Take 5-10 mg by mouth 3 (three) times daily as needed. , Disp: , Rfl:  .  Blood Glucose Monitoring Suppl (ACCU-CHEK GUIDE) w/Device KIT, 1 Units by Does not apply route daily., Disp: 1 kit, Rfl: 0 .  clopidogrel (PLAVIX) 75 MG tablet, Take 1 tablet (75 mg total) by mouth daily with breakfast., Disp: 90 tablet, Rfl: 3 .  dapagliflozin propanediol (FARXIGA) 10 MG TABS tablet, Take 1 tablet (10  mg total) by mouth daily before breakfast., Disp: 30 tablet, Rfl: 6 .  diazepam (VALIUM) 5 MG tablet, Take 1 by mouth 1 hour  pre-procedure with very light food. May bring 2nd tablet to appointment., Disp: 2 tablet, Rfl: 0 .  enalapril (VASOTEC) 20 MG tablet, Take 20 mg by mouth at bedtime., Disp: , Rfl:  .  fluticasone (FLONASE) 50 MCG/ACT nasal spray, PLACE 1 SPRAY INTO BOTH NOSTRILS DAILY., Disp: 16 g, Rfl: 11 .  furosemide (LASIX) 40 MG tablet, Take 1 tablet (40 mg total) by mouth 2 (two)  times daily., Disp: 180 tablet, Rfl: 3 .  glucose blood (ACCU-CHEK GUIDE) test strip, Use as instructed, Disp: 100 each, Rfl: 12 .  isosorbide mononitrate (IMDUR) 30 MG 24 hr tablet, Take 1 tablet (30 mg total) by mouth daily., Disp: 90 tablet, Rfl: 3 .  lidocaine (LIDODERM) 5 %, Place 1 patch onto the skin daily. Remove & Discard patch within 12 hours or as directed by MD, Disp: 5 patch, Rfl: 0 .  Multiple Vitamin (MULTIVITAMIN WITH MINERALS) TABS tablet, Take 1 tablet by mouth daily., Disp: , Rfl:  .  nicotine (NICODERM CQ - DOSED IN MG/24 HOURS) 21 mg/24hr patch, Place 1 patch (21 mg total) onto the skin daily., Disp: 28 patch, Rfl: 2 .  oxymetazoline (AFRIN NASAL SPRAY) 0.05 % nasal spray, Place 2 sprays into both nostrils daily. Only use when having bleeding., Disp: 30 mL, Rfl: 0 .  pantoprazole (PROTONIX) 40 MG tablet, Take 1 tablet (40 mg total) by mouth daily., Disp: 90 tablet, Rfl: 0 .  potassium chloride SA (KLOR-CON) 20 MEQ tablet, TAKE 1 TABLET(20 MEQ) BY MOUTH DAILY (Patient taking differently: Take 20 mEq by mouth daily. TAKE 1 TABLET(20 MEQ) BY MOUTH DAILY), Disp: 90 tablet, Rfl: 3 .  pregabalin (LYRICA) 100 MG capsule, Take 1 tablet three times daily (Patient taking differently: Take 100 mg by mouth 3 (three) times daily. Take 1 tablet three times daily), Disp: 270 capsule, Rfl: 3 .  Tafamidis (VYNDAMAX) 61 MG CAPS, Take 61 mg by mouth daily., Disp: , Rfl:  .  umeclidinium-vilanterol (ANORO ELLIPTA) 62.5-25 MCG/INH AEPB, Inhale 1 puff into the lungs daily., Disp: 60 each, Rfl: 2 .  valACYclovir (VALTREX) 500 MG tablet, Take 1 tablet (500 mg total) by mouth daily., Disp: 90 tablet, Rfl: 3 Allergies  Allergen Reactions  . Varenicline Other (See Comments)    Patient reports laryngospasm stopped in the ER  . Bupropion Other (See Comments)    Headache - moderate/severe - self discontinued agent      Social History   Socioeconomic History  . Marital status: Divorced    Spouse  name: Not on file  . Number of children: 2  . Years of education: 10  . Highest education level: Not on file  Occupational History  . Occupation: not employed  Tobacco Use  . Smoking status: Current Every Day Smoker    Packs/day: 0.50    Years: 54.00    Pack years: 27.00    Types: Cigarettes, Cigars  . Smokeless tobacco: Never Used  . Tobacco comment: half pack daily  Vaping Use  . Vaping Use: Never used  Substance and Sexual Activity  . Alcohol use: Yes    Alcohol/week: 4.0 standard drinks    Types: 2 Cans of beer, 2 Shots of liquor per week  . Drug use: Not Currently    Comment: last use may last year  . Sexual activity: Yes  Other Topics Concern  .  Not on file  Social History Narrative   Right handed   Two story home   No caffeine   Social Determinants of Health   Financial Resource Strain: Low Risk   . Difficulty of Paying Living Expenses: Not very hard  Food Insecurity: No Food Insecurity  . Worried About Charity fundraiser in the Last Year: Never true  . Ran Out of Food in the Last Year: Never true  Transportation Needs: No Transportation Needs  . Lack of Transportation (Medical): No  . Lack of Transportation (Non-Medical): No  Physical Activity:   . Days of Exercise per Week: Not on file  . Minutes of Exercise per Session: Not on file  Stress:   . Feeling of Stress : Not on file  Social Connections:   . Frequency of Communication with Friends and Family: Not on file  . Frequency of Social Gatherings with Friends and Family: Not on file  . Attends Religious Services: Not on file  . Active Member of Clubs or Organizations: Not on file  . Attends Archivist Meetings: Not on file  . Marital Status: Not on file  Intimate Partner Violence:   . Fear of Current or Ex-Partner: Not on file  . Emotionally Abused: Not on file  . Physically Abused: Not on file  . Sexually Abused: Not on file    Physical Exam Vitals reviewed.  Constitutional:       Appearance: Normal appearance. He is normal weight.  HENT:     Head: Normocephalic.     Nose: Nose normal.     Mouth/Throat:     Mouth: Mucous membranes are moist.  Eyes:     Pupils: Pupils are equal, round, and reactive to light.  Cardiovascular:     Rate and Rhythm: Normal rate and regular rhythm.     Pulses: Normal pulses.     Heart sounds: Normal heart sounds.  Pulmonary:     Effort: Pulmonary effort is normal.     Breath sounds: Normal breath sounds.  Abdominal:     General: Abdomen is flat.     Palpations: Abdomen is soft.  Musculoskeletal:        General: Normal range of motion.     Cervical back: Normal range of motion.     Right lower leg: No edema.     Left lower leg: No edema.  Skin:    General: Skin is warm and dry.     Capillary Refill: Capillary refill takes less than 2 seconds.  Neurological:     Mental Status: He is alert. Mental status is at baseline.  Psychiatric:        Mood and Affect: Mood normal.     Arrived for home visit for Russell Collier who was alert and oriented seated in his kitchen reporting he was feeling okay other than his chronic back pain. Russell Collier's vitals were as noted. Russell Collier was reminded of all upcoming appointments. Medications were verified and confirmed. Pill box filled accordingly. Russell Collier was requesting appointment with PCP Caron Presume, and is interested in [employer] shot. I will follow up with same. Russell Collier agreed to visit in one week. Home visit complete.   Refills:  Plavix Lyrica     Future Appointments  Date Time Provider [place]  12/11/2019  3:30 PM Gardiner Barefoot, DPM [facility]  01/31/2020  3:00 PM [name], [name], MD [facility] None     ACTION: Home visit completed Next visit planned for one week.

## 2019-12-03 ENCOUNTER — Other Ambulatory Visit (HOSPITAL_COMMUNITY): Payer: Self-pay

## 2019-12-03 ENCOUNTER — Other Ambulatory Visit: Payer: Self-pay | Admitting: Physician Assistant

## 2019-12-03 NOTE — Progress Notes (Signed)
Paramedicine Encounter    Patient ID: Russell Collier, male    DOB: Nov 21, 1960, 59 y.o.   MRN: [id number]   Patient Care Team: [name], MD as PCP - General (Family Medicine) [name], MD as PCP - Cardiology (Cardiology) Michael Boston, MD as Consulting Physician (General Surgery) Wilford Corner, MD as Consulting Physician (Gastroenterology) Jorge Ny, LCSW as Social Worker (Licensed Clinical Social Worker) Alda Berthold, DO as Consulting Physician (Neurology) Jola Baptist, [place] as Referring Physician (Chiropractic Medicine)  Patient Active Problem List   Diagnosis Date Noted  . Mild epistaxis 10/30/2019  . Lower extremity edema   . 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 Acute respiratory failure ([place]) 07/27/2019  . Breast tenderness in male 05/31/2019  . Chronic pain 05/31/2019  . AKI (acute kidney injury) ([place]) 05/31/2019  . Pain due to onychomycosis of toenails of both feet 05/16/2019  . Chronic knee pain 04/16/2019  . Diabetes ([place]) 04/16/2019  . Esophageal reflux 02/06/2019  . Heartburn 02/06/2019  . Chronic skin ulcer of lower leg ([place]) 12/21/2018  . S/P drug eluting coronary stent placement   . Coronary artery disease involving native coronary artery of native heart with unstable angina pectoris ([place]) 12/20/2018  . Unstable angina ([place])   . 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 Acute on chronic diastolic heart failure ([place])   . Shortness of breath   . Precordial chest pain   . Idiopathic peripheral neuropathy   . Palpitations   . Chronic diastolic heart failure ([place])   . Abnormal ankle brachial index (ABI)   . Chest pain, rule out acute myocardial infarction 09/26/2018  . Dyspepsia   . Morbid obesity ([place])   . OSA on CPAP   . Chest pain 05/30/2018  . CAD (coronary artery disease) 03/30/2018  . Elevated troponin 02/03/2018  . Positive urine drug screen 02/03/2018  . Tobacco abuse 02/03/2018  . Hyperlipidemia 02/03/2018  . Acute respiratory failure  with hypoxia ([place]) 02/02/2018  . Acute congestive heart failure ([place])   . Atypical chest pain 12/27/2015  . Essential hypertension   . Neuropathy     Current Outpatient Medications:  .  Accu-Chek Softclix Lancets lancets, Use as instructed, Disp: 100 each, Rfl: 12 .  acetaminophen (TYLENOL) 650 MG CR tablet, Take 1 tablet (650 mg total) by mouth every 8 (eight) hours as needed for pain., Disp: 270 tablet, Rfl: 0 .  albuterol (PROVENTIL) (2.5 MG/3ML) 0.083% nebulizer solution, Take 2.5 mg by nebulization every 6 (six) hours as needed for wheezing or shortness of breath., Disp: , Rfl:  .  albuterol (VENTOLIN HFA) 108 (90 Base) MCG/ACT inhaler, Inhale 2 puffs into the lungs every 6 (six) hours as needed for wheezing or shortness of breath., Disp: , Rfl:  .  apixaban (ELIQUIS) 5 MG TABS tablet, Take 1 tablet (5 mg total) by mouth 2 (two) times daily., Disp: 60 tablet, Rfl: 11 .  atorvastatin (LIPITOR) 80 MG tablet, Take 1 tablet (80 mg total) by mouth daily., Disp: 90 tablet, Rfl: 3 .  baclofen (LIORESAL) 10 MG tablet, Take 5-10 mg by mouth 3 (three) times daily as needed.  (Patient not taking: Reported on 11/26/2019), Disp: , Rfl:  .  Blood Glucose Monitoring Suppl (ACCU-CHEK GUIDE) w/Device KIT, 1 Units by Does not apply route daily., Disp: 1 kit, Rfl: 0 .  clopidogrel (PLAVIX) 75 MG tablet, Take 1 tablet (75 mg total) by mouth daily with breakfast., Disp: 90 tablet, Rfl: 3 .  dapagliflozin propanediol (FARXIGA) 10 MG  TABS tablet, Take 1 tablet (10 mg total) by mouth daily before breakfast., Disp: 30 tablet, Rfl: 6 .  diazepam (VALIUM) 5 MG tablet, Take 1 by mouth 1 hour  pre-procedure with very light food. May bring 2nd tablet to appointment. (Patient not taking: Reported on 11/26/2019), Disp: 2 tablet, Rfl: 0 .  enalapril (VASOTEC) 20 MG tablet, Take 20 mg by mouth at bedtime., Disp: , Rfl:  .  fluticasone (FLONASE) 50 MCG/ACT nasal spray, PLACE 1 SPRAY INTO BOTH NOSTRILS DAILY., Disp: 16 g, Rfl:  11 .  furosemide (LASIX) 40 MG tablet, Take 1 tablet (40 mg total) by mouth 2 (two) times daily., Disp: 180 tablet, Rfl: 3 .  glucose blood (ACCU-CHEK GUIDE) test strip, Use as instructed, Disp: 100 each, Rfl: 12 .  isosorbide mononitrate (IMDUR) 30 MG 24 hr tablet, Take 1 tablet (30 mg total) by mouth daily., Disp: 90 tablet, Rfl: 3 .  lidocaine (LIDODERM) 5 %, Place 1 patch onto the skin daily. Remove & Discard patch within 12 hours or as directed by MD (Patient not taking: Reported on 11/26/2019), Disp: 5 patch, Rfl: 0 .  Multiple Vitamin (MULTIVITAMIN WITH MINERALS) TABS tablet, Take 1 tablet by mouth daily., Disp: , Rfl:  .  nicotine (NICODERM CQ - DOSED IN MG/24 HOURS) 21 mg/24hr patch, Place 1 patch (21 mg total) onto the skin daily. (Patient not taking: Reported on 11/26/2019), Disp: 28 patch, Rfl: 2 .  oxymetazoline (AFRIN NASAL SPRAY) 0.05 % nasal spray, Place 2 sprays into both nostrils daily. Only use when having bleeding. (Patient not taking: Reported on 11/26/2019), Disp: 30 mL, Rfl: 0 .  pantoprazole (PROTONIX) 40 MG tablet, Take 1 tablet (40 mg total) by mouth daily., Disp: 90 tablet, Rfl: 0 .  potassium chloride SA (KLOR-CON) 20 MEQ tablet, TAKE 1 TABLET(20 MEQ) BY MOUTH DAILY (Patient taking differently: Take 20 mEq by mouth daily. TAKE 1 TABLET(20 MEQ) BY MOUTH DAILY), Disp: 90 tablet, Rfl: 3 .  pregabalin (LYRICA) 100 MG capsule, TAKE ONE CAPSULE BY MOUTH THREE TIMES DAILY, Disp: 270 capsule, Rfl: 2 .  Tafamidis (VYNDAMAX) 61 MG CAPS, Take 61 mg by mouth daily., Disp: , Rfl:  .  umeclidinium-vilanterol (ANORO ELLIPTA) 62.5-25 MCG/INH AEPB, Inhale 1 puff into the lungs daily., Disp: 60 each, Rfl: 2 .  valACYclovir (VALTREX) 500 MG tablet, Take 1 tablet (500 mg total) by mouth daily., Disp: 90 tablet, Rfl: 3 Allergies  Allergen Reactions  . Varenicline Other (See Comments)    Patient reports laryngospasm stopped in the ER  . Bupropion Other (See Comments)    Headache -  moderate/severe - self discontinued agent      Social History   Socioeconomic History  . Marital status: Divorced    Spouse name: Not on file  . Number of children: 2  . Years of education: 10  . Highest education level: Not on file  Occupational History  . Occupation: not employed  Tobacco Use  . Smoking status: Current Every Day Smoker    Packs/day: 0.50    Years: 54.00    Pack years: 27.00    Types: Cigarettes, Cigars  . Smokeless tobacco: Never Used  . Tobacco comment: half pack daily  Vaping Use  . Vaping Use: Never used  Substance and Sexual Activity  . Alcohol use: Yes    Alcohol/week: 4.0 standard drinks    Types: 2 Cans of beer, 2 Shots of liquor per week  . Drug use: Not Currently    Comment:  last use may last year  . Sexual activity: Yes  Other Topics Concern  . Not on file  Social History Narrative   Right handed   Two story home   No caffeine   Social Determinants of Health   Financial Resource Strain: Low Risk   . Difficulty of Paying Living Expenses: Not very hard  Food Insecurity: No Food Insecurity  . Worried About Charity fundraiser in the Last Year: Never true  . Ran Out of Food in the Last Year: Never true  Transportation Needs: No Transportation Needs  . Lack of Transportation (Medical): No  . Lack of Transportation (Non-Medical): No  Physical Activity:   . Days of Exercise per Week: Not on file  . Minutes of Exercise per Session: Not on file  Stress:   . Feeling of Stress : Not on file  Social Connections:   . Frequency of Communication with Friends and Family: Not on file  . Frequency of Social Gatherings with Friends and Family: Not on file  . Attends Religious Services: Not on file  . Active Member of Clubs or Organizations: Not on file  . Attends Archivist Meetings: Not on file  . Marital Status: Not on file  Intimate Partner Violence:   . Fear of Current or Ex-Partner: Not on file  . Emotionally Abused: Not on file   . Physically Abused: Not on file  . Sexually Abused: Not on file    Physical Exam Vitals reviewed.  Constitutional:      Appearance: He is normal weight.  HENT:     Head: Normocephalic.     Nose: Nose normal.     Mouth/Throat:     Mouth: Mucous membranes are moist.     Pharynx: Oropharynx is clear.  Eyes:     Pupils: Pupils are equal, round, and reactive to light.  Cardiovascular:     Rate and Rhythm: Normal rate and regular rhythm.     Pulses: Normal pulses.     Heart sounds: Normal heart sounds.  Pulmonary:     Effort: Pulmonary effort is normal.     Breath sounds: Normal breath sounds.  Abdominal:     General: Abdomen is flat.     Palpations: Abdomen is soft.  Musculoskeletal:        General: Normal range of motion.     Cervical back: Normal range of motion.     Right lower leg: No edema.     Left lower leg: No edema.  Skin:    General: Skin is warm and dry.     Capillary Refill: Capillary refill takes less than 2 seconds.  Neurological:     Mental Status: He is alert. Mental status is at baseline.  Psychiatric:        Mood and Affect: Mood normal.     Arrived for home visit for Marquay who was alert and oriented standing in his kitchen complaining of his chronic back pain. Garrus reports he is going to follow up with his orthopedic doctor about same. I obtained vitals which are normal and are as noted. Lung sounds clear, no swelling or edema noted. Medications reviewed and confirmed. Pill box filled accordingly. Lenzie made aware of upcoming appointments. Home visit complete.   Refills: -Farxiga -Valtrex -Eliquis -Lasix       Future Appointments  Date Time Provider [place]  12/10/2019  2:45 PM [name], MD [facility]  12/11/2019  3:30 PM Gardiner Barefoot, DPM [facility]  01/31/2020  3:00 PM [name], [name], MD [facility] None     ACTION: Home visit completed Next visit planned for one week

## 2019-12-06 ENCOUNTER — Telehealth: Payer: Self-pay | Admitting: Pharmacist

## 2019-12-06 NOTE — Telephone Encounter (Signed)
Phone contact to follow-up on tobacco reduction.    Patient reports continued use of nicotine patch when going with friends out of the house however he avoids using every day due to skin intolerance.   Patient reports continued smoking of ~ 10 cigs per day.   He remains interested in quitting but has made little progress recently, most likely related to moderate-sever pain in back and knees.    We agreed on a two week follow-up call to assess progress toward continued reduction.

## 2019-12-10 ENCOUNTER — Encounter: Payer: Self-pay | Admitting: Family Medicine

## 2019-12-10 ENCOUNTER — Other Ambulatory Visit (HOSPITAL_COMMUNITY): Payer: Self-pay

## 2019-12-10 ENCOUNTER — Ambulatory Visit (INDEPENDENT_AMBULATORY_CARE_PROVIDER_SITE_OTHER): Payer: Medicaid Other | Admitting: Family Medicine

## 2019-12-10 ENCOUNTER — Other Ambulatory Visit: Payer: Self-pay

## 2019-12-10 VITALS — BP 124/68 | HR 81 | Ht 70.0 in | Wt 310.0 lb

## 2019-12-10 DIAGNOSIS — R040 Epistaxis: Secondary | ICD-10-CM

## 2019-12-10 DIAGNOSIS — G4733 Obstructive sleep apnea (adult) (pediatric): Secondary | ICD-10-CM

## 2019-12-10 DIAGNOSIS — R252 Cramp and spasm: Secondary | ICD-10-CM | POA: Diagnosis not present

## 2019-12-10 DIAGNOSIS — M25561 Pain in right knee: Secondary | ICD-10-CM

## 2019-12-10 DIAGNOSIS — I10 Essential (primary) hypertension: Secondary | ICD-10-CM | POA: Diagnosis not present

## 2019-12-10 DIAGNOSIS — E1151 Type 2 diabetes mellitus with diabetic peripheral angiopathy without gangrene: Secondary | ICD-10-CM

## 2019-12-10 DIAGNOSIS — M25562 Pain in left knee: Secondary | ICD-10-CM

## 2019-12-10 DIAGNOSIS — G8929 Other chronic pain: Secondary | ICD-10-CM | POA: Diagnosis not present

## 2019-12-10 DIAGNOSIS — Z9989 Dependence on other enabling machines and devices: Secondary | ICD-10-CM

## 2019-12-10 DIAGNOSIS — R5383 Other fatigue: Secondary | ICD-10-CM | POA: Diagnosis not present

## 2019-12-10 HISTORY — DX: Cramp and spasm: R25.2

## 2019-12-10 LAB — POCT GLYCOSYLATED HEMOGLOBIN (HGB A1C): HbA1c, POC (controlled diabetic range): 6.4 % (ref 0.0–7.0)

## 2019-12-10 MED ORDER — METHYLPREDNISOLONE ACETATE 40 MG/ML IJ SUSP
40.0000 mg | Freq: Once | INTRAMUSCULAR | Status: DC
Start: 2019-12-10 — End: 2020-06-24

## 2019-12-10 NOTE — Assessment & Plan Note (Signed)
Patient reports worsening leg cramping.  Reports that he has had this before when he had issues with his potassium.  He takes potassium supplementation daily and also takes a diuretic.  Most recent BMP showed normal potassium.  We will collect lab work today with a BMP to evaluate his electrolytes as well as his kidney function given he has been taking NSAIDs.

## 2019-12-10 NOTE — Assessment & Plan Note (Signed)
Patient reports worsening fatigue.  Denies any blood in his stool but does report recurrent nosebleeds after he takes NSAIDs.  Reports that he is going to stop taking NSAIDs.  Etiology of fatigue may be anemia.  Also considering thyroid issues so going to evaluate TSH.  Also checking a vitamin D level.  Patient is concerned about his testosterone as well but is willing to hold off on checking testosterone at this time.  Depression may be playing a role in this as well although he did report a 0 on his PHQ-9.  We will continue to monitor this.

## 2019-12-10 NOTE — Progress Notes (Signed)
SUBJECTIVE:   CHIEF COMPLAINT / HPI:   Back pain  Patient reports continued severe low back pain.  Reports that he recently had an injection in his lumbar spine with little to no relief.  He would like to see the orthopedist again but was unsure if there is need for a second referral which is one reason why he came in for the visit today.  Pain is similar to previous pain with no red flag symptoms.  Pain has been worsening.  Patient reports he takes ibuprofen or Tylenol for the pain as well as the pain medication that the orthopedist prescribed him.  He reports that he usually takes 600 mg although he has taken 1000 mg of more of ibuprofen but when he does that he gets nosebleeds so he does not do that anymore.  I discussed limiting his ibuprofen use given his issues with his kidneys and patient reports that he will do his best but it is the only thing that really helps.  Leg cramps  Patient reports worsening leg Cramps recently.  Is concerned that it may be due to his potassium.  Patient reports he takes diuretics and that he also takes potassium supplementation but when his potassium has been low in the past he has had worsening cramps and so he would like his potassium checked today.  Reports that the leg cramps do not happen all the time but they are concerning him.  Fatigue  Patient reports worsening fatigue recently.  He is concerned about his testosterone levels, vitamin D levels.  Denies any signs of bleeding such as dark stools or blood in his stools but does report frequent nosebleeds which we know about.  It has been several weeks since he has had a nosebleed though and he is still feeling fatigued.  Patient also reports that he has been a little down because of his worsening pain.  Denies any depression though but has difficulty explaining how he feels because of the pain.  Knee pain  Patient has chronic knee pain in both knees.  Reports the right knee is worse than the left at this  time.  Reports that he has a ganglion cyst in his right knee that they tried to drain but got very little return out of approximately 2 months ago.  Per chart review this was 8/4.  Patient reports they also injected steroids into his knee at that time.  Per chart review it appears that they injected steroids into the ganglion cyst but not into his knee joint.  Most recent knee joint injection was in June of this year.  Patient is going to follow-up with orthopedics regarding the knee pain as well.  OSA with CPAP Patient reports he has not been using his CPAP because he heard there was a recall.  Patient reports he has had his CPAP for 15 years and that he was fitted for a new one a few years back but did not use it and so the medical supply company came and took it back.  Reports that he would like to establish with a pulmonologist in the [facility] system.  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  Most recent one was 130.  Denies any low blood sugars.  OBJECTIVE:   BP 124/68   Pulse 81   Ht 5\' 10"  (1.778 m)   Wt (!) 310 lb (  140.6 kg)   SpO2 99%   BMI 44.48 kg/m   General: Well-appearing obese male, no acute distress Cardiac: Regular rate and rhythm Respiratory: Normal work of breathing, lungs clear to auscultation bilaterally Abdomen: Obese, soft, nontender, positive bowel sounds MSK: Patient with pain in both knees as well as low back.  No swelling or edema noted in right knee.  No redness.  No signs of infection.  After consent was obtained, using sterile technique the right knee was prepped and 3 ml's of 1% plain Lidocaine used to anesthetize the needle tract into the joint from the medial infrapatellar approach. 4 mg Depo-Medrol and 2 ml plain Lidocaine was then injected and the needle withdrawn.  The procedure was well tolerated.  The patient is asked to continue to rest the knee for a few  more days before resuming regular activities.  It may be more painful for the first 1-2 days.  Watch for fever, or increased swelling or persistent pain in knee. Call or return to clinic prn if such symptoms occur or the knee fails to improve as anticipated.   ASSESSMENT/PLAN:   OSA on CPAP History of OSA and has a CPAP which he does not use.  Reports he has had the CPAP for 15 years and feels like he may need to be refitted.  Does not follow with a pulmonologist in the [facility] system and would like a referral for that. -Referral placed to pulmonology  Diabetes [facility]) Doing well at this time.  Hemoglobin A1c today was 6.4 from 7.0 at previous visit.  Patient reports his blood sugars have been well controlled and denies any signs or symptoms of low blood sugar. -Patient continuing to work on dietary changes and weight loss -No medication management at this time -Diabetes follow-up in 3 months  Chronic knee pain Chronic knee pain.  Worse on the right than the left.  He has seen orthopedics and would like to go back to see them.  Patient needs to continue to avoid NSAIDs although patient reports he continues to use them.  Continued recommendations of avoiding NSAIDs.  Provided knee infection today and recommended following up with orthopedics.  Patient is agreeable to this  Fatigue Patient reports worsening fatigue.  Denies any blood in his stool but does report recurrent nosebleeds after he takes NSAIDs.  Reports that he is going to stop taking NSAIDs.  Etiology of fatigue may be anemia.  Also considering thyroid issues so going to evaluate TSH.  Also checking a vitamin D level.  Patient is concerned about his testosterone as well but is willing to hold off on checking testosterone at this time.  Depression may be playing a role in this as well although he did report a 0 on his PHQ-9.  We will continue to monitor this.  Leg cramps Patient reports worsening leg cramping.  Reports that he has had this  before when he had issues with his potassium.  He takes potassium supplementation daily and also takes a diuretic.  Most recent BMP showed normal potassium.  We will collect lab work today with a BMP to evaluate his electrolytes as well as his kidney function given he has been taking NSAIDs.     [name], MD [place]

## 2019-12-10 NOTE — Progress Notes (Signed)
Paramedicine Encounter    Patient ID: Colsen Modi, male    DOB: 15-Jan-1961, 59 y.o.   MRN: [id number]   Patient Care Team: [name], MD as PCP - General (Family Medicine) [name], MD as PCP - Cardiology (Cardiology) Michael Boston, MD as Consulting Physician (General Surgery) Wilford Corner, MD as Consulting Physician (Gastroenterology) Jorge Ny, LCSW as Social Worker (Licensed Clinical Social Worker) Alda Berthold, DO as Consulting Physician (Neurology) Jola Baptist, [place] as Referring Physician (Chiropractic Medicine)  Patient Active Problem List   Diagnosis Date Noted  . Mild epistaxis 10/30/2019  . Lower extremity edema   . 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 Acute respiratory failure ([place]) 07/27/2019  . Breast tenderness in male 05/31/2019  . Chronic pain 05/31/2019  . AKI (acute kidney injury) ([place]) 05/31/2019  . Pain due to onychomycosis of toenails of both feet 05/16/2019  . Chronic knee pain 04/16/2019  . Diabetes ([place]) 04/16/2019  . Esophageal reflux 02/06/2019  . Heartburn 02/06/2019  . Chronic skin ulcer of lower leg ([place]) 12/21/2018  . S/P drug eluting coronary stent placement   . Coronary artery disease involving native coronary artery of native heart with unstable angina pectoris ([place]) 12/20/2018  . Unstable angina ([place])   . 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 Acute on chronic diastolic heart failure ([place])   . Shortness of breath   . Precordial chest pain   . Idiopathic peripheral neuropathy   . Palpitations   . Chronic diastolic heart failure ([place])   . Abnormal ankle brachial index (ABI)   . Chest pain, rule out acute myocardial infarction 09/26/2018  . Dyspepsia   . Morbid obesity ([place])   . OSA on CPAP   . Chest pain 05/30/2018  . CAD (coronary artery disease) 03/30/2018  . Elevated troponin 02/03/2018  . Positive urine drug screen 02/03/2018  . Tobacco abuse 02/03/2018  . Hyperlipidemia 02/03/2018  . Acute respiratory failure  with hypoxia ([place]) 02/02/2018  . Acute congestive heart failure ([place])   . Atypical chest pain 12/27/2015  . Essential hypertension   . Neuropathy     Current Outpatient Medications:  .  furosemide (LASIX) 40 MG tablet, TAKE ONE TABLET BY MOUTH TWICE DAILY, Disp: 180 tablet, Rfl: 3 .  Accu-Chek Softclix Lancets lancets, Use as instructed, Disp: 100 each, Rfl: 12 .  acetaminophen (TYLENOL) 650 MG CR tablet, Take 1 tablet (650 mg total) by mouth every 8 (eight) hours as needed for pain., Disp: 270 tablet, Rfl: 0 .  albuterol (PROVENTIL) (2.5 MG/3ML) 0.083% nebulizer solution, Take 2.5 mg by nebulization every 6 (six) hours as needed for wheezing or shortness of breath., Disp: , Rfl:  .  albuterol (VENTOLIN HFA) 108 (90 Base) MCG/ACT inhaler, Inhale 2 puffs into the lungs every 6 (six) hours as needed for wheezing or shortness of breath., Disp: , Rfl:  .  apixaban (ELIQUIS) 5 MG TABS tablet, Take 1 tablet (5 mg total) by mouth 2 (two) times daily., Disp: 60 tablet, Rfl: 11 .  atorvastatin (LIPITOR) 80 MG tablet, Take 1 tablet (80 mg total) by mouth daily., Disp: 90 tablet, Rfl: 3 .  baclofen (LIORESAL) 10 MG tablet, Take 5-10 mg by mouth 3 (three) times daily as needed.  (Patient not taking: Reported on 11/26/2019), Disp: , Rfl:  .  Blood Glucose Monitoring Suppl (ACCU-CHEK GUIDE) w/Device KIT, 1 Units by Does not apply route daily., Disp: 1 kit, Rfl: 0 .  clopidogrel (PLAVIX) 75 MG tablet, Take 1 tablet (75  mg total) by mouth daily with breakfast., Disp: 90 tablet, Rfl: 3 .  dapagliflozin propanediol (FARXIGA) 10 MG TABS tablet, Take 1 tablet (10 mg total) by mouth daily before breakfast., Disp: 30 tablet, Rfl: 6 .  diazepam (VALIUM) 5 MG tablet, Take 1 by mouth 1 hour  pre-procedure with very light food. May bring 2nd tablet to appointment. (Patient not taking: Reported on 11/26/2019), Disp: 2 tablet, Rfl: 0 .  enalapril (VASOTEC) 20 MG tablet, Take 20 mg by mouth at bedtime., Disp: , Rfl:  .   fluticasone (FLONASE) 50 MCG/ACT nasal spray, PLACE 1 SPRAY INTO BOTH NOSTRILS DAILY., Disp: 16 g, Rfl: 11 .  glucose blood (ACCU-CHEK GUIDE) test strip, Use as instructed, Disp: 100 each, Rfl: 12 .  isosorbide mononitrate (IMDUR) 30 MG 24 hr tablet, Take 1 tablet (30 mg total) by mouth daily., Disp: 90 tablet, Rfl: 3 .  lidocaine (LIDODERM) 5 %, Place 1 patch onto the skin daily. Remove & Discard patch within 12 hours or as directed by MD (Patient not taking: Reported on 11/26/2019), Disp: 5 patch, Rfl: 0 .  Multiple Vitamin (MULTIVITAMIN WITH MINERALS) TABS tablet, Take 1 tablet by mouth daily., Disp: , Rfl:  .  nicotine (NICODERM CQ - DOSED IN MG/24 HOURS) 21 mg/24hr patch, Place 1 patch (21 mg total) onto the skin daily., Disp: 28 patch, Rfl: 2 .  oxymetazoline (AFRIN NASAL SPRAY) 0.05 % nasal spray, Place 2 sprays into both nostrils daily. Only use when having bleeding. (Patient not taking: Reported on 12/06/2019), Disp: 30 mL, Rfl: 0 .  pantoprazole (PROTONIX) 40 MG tablet, Take 1 tablet (40 mg total) by mouth daily., Disp: 90 tablet, Rfl: 0 .  potassium chloride SA (KLOR-CON) 20 MEQ tablet, TAKE 1 TABLET(20 MEQ) BY MOUTH DAILY (Patient taking differently: Take 20 mEq by mouth daily. TAKE 1 TABLET(20 MEQ) BY MOUTH DAILY), Disp: 90 tablet, Rfl: 3 .  pregabalin (LYRICA) 100 MG capsule, TAKE ONE CAPSULE BY MOUTH THREE TIMES DAILY, Disp: 270 capsule, Rfl: 2 .  Tafamidis (VYNDAMAX) 61 MG CAPS, Take 61 mg by mouth daily., Disp: , Rfl:  .  umeclidinium-vilanterol (ANORO ELLIPTA) 62.5-25 MCG/INH AEPB, Inhale 1 puff into the lungs daily. (Patient not taking: Reported on 12/06/2019), Disp: 60 each, Rfl: 2 .  valACYclovir (VALTREX) 500 MG tablet, Take 1 tablet (500 mg total) by mouth daily., Disp: 90 tablet, Rfl: 3 Allergies  Allergen Reactions  . Varenicline Other (See Comments)    Patient reports laryngospasm stopped in the ER  . Bupropion Other (See Comments)    Headache - moderate/severe - self  discontinued agent      Social History   Socioeconomic History  . Marital status: Divorced    Spouse name: Not on file  . Number of children: 2  . Years of education: 10  . Highest education level: Not on file  Occupational History  . Occupation: not employed  Tobacco Use  . Smoking status: Current Every Day Smoker    Packs/day: 0.50    Years: 54.00    Pack years: 27.00    Types: Cigarettes, Cigars  . Smokeless tobacco: Never Used  . Tobacco comment: half pack daily  Vaping Use  . Vaping Use: Never used  Substance and Sexual Activity  . Alcohol use: Yes    Alcohol/week: 4.0 standard drinks    Types: 2 Cans of beer, 2 Shots of liquor per week  . Drug use: Not Currently    Comment: last use may last year  .  Sexual activity: Yes  Other Topics Concern  . Not on file  Social History Narrative   Right handed   Two story home   No caffeine   Social Determinants of Health   Financial Resource Strain: Low Risk   . Difficulty of Paying Living Expenses: Not very hard  Food Insecurity: No Food Insecurity  . Worried About Charity fundraiser in the Last Year: Never true  . Ran Out of Food in the Last Year: Never true  Transportation Needs: No Transportation Needs  . Lack of Transportation (Medical): No  . Lack of Transportation (Non-Medical): No  Physical Activity:   . Days of Exercise per Week: Not on file  . Minutes of Exercise per Session: Not on file  Stress:   . Feeling of Stress : Not on file  Social Connections:   . Frequency of Communication with Friends and Family: Not on file  . Frequency of Social Gatherings with Friends and Family: Not on file  . Attends Religious Services: Not on file  . Active Member of Clubs or Organizations: Not on file  . Attends Archivist Meetings: Not on file  . Marital Status: Not on file  Intimate Partner Violence:   . Fear of Current or Ex-Partner: Not on file  . Emotionally Abused: Not on file  . Physically Abused:  Not on file  . Sexually Abused: Not on file    Physical Exam Vitals reviewed.  Constitutional:      Appearance: He is normal weight.  HENT:     Head: Normocephalic.     Nose: Nose normal.     Mouth/Throat:     Mouth: Mucous membranes are moist.     Pharynx: Oropharynx is clear.  Eyes:     Pupils: Pupils are equal, round, and reactive to light.  Cardiovascular:     Rate and Rhythm: Normal rate and regular rhythm.     Pulses: Normal pulses.     Heart sounds: Normal heart sounds.  Pulmonary:     Effort: Pulmonary effort is normal.     Breath sounds: Normal breath sounds.  Abdominal:     General: Abdomen is flat.     Palpations: Abdomen is soft.  Musculoskeletal:        General: Normal range of motion.     Right lower leg: No edema.     Left lower leg: No edema.  Skin:    General: Skin is warm and dry.     Capillary Refill: Capillary refill takes less than 2 seconds.  Neurological:     General: No focal deficit present.     Mental Status: He is alert. Mental status is at baseline.  Psychiatric:        Mood and Affect: Mood normal.    Arrived for home visit for Hugo who was alert and oriented saying his back pain has worsened over the last few weeks. Chrystopher has been compliant with all medications and has been compliant with appointments. Medications confirmed and pill box filled accordingly. Reviewed appointments and vitals as noted. No edema noted. Lung sounds clear. Weylyn confirmed he will be seeing his PCP later today. I will follow up with him next week.     Refills:  Farxiga Potassium Valtrex   Future Appointments  Date Time Provider [place]  12/10/2019  2:45 PM [name], MD [facility]  12/11/2019  3:30 PM Gardiner Barefoot, DPM [facility]  01/31/2020  3:00 PM [name], [name], MD  [facility] None     ACTION: Home visit completed Next visit planned for one week

## 2019-12-10 NOTE — Assessment & Plan Note (Signed)
Chronic knee pain.  Worse on the right than the left.  He has seen orthopedics and would like to go back to see them.  Patient needs to continue to avoid NSAIDs although patient reports he continues to use them.  Continued recommendations of avoiding NSAIDs.  Provided knee infection today and recommended following up with orthopedics.  Patient is agreeable to this

## 2019-12-10 NOTE — Assessment & Plan Note (Signed)
Doing well at this time.  Hemoglobin A1c today was 6.4 from 7.0 at previous visit.  Patient reports his blood sugars have been well controlled and denies any signs or symptoms of low blood sugar. -Patient continuing to work on dietary changes and weight loss -No medication management at this time -Diabetes follow-up in 3 months

## 2019-12-10 NOTE — Patient Instructions (Signed)
Great seeing you today.  I am sorry you are still having issues with your knee pain as well as his fatigue.  We injected your knee today, please remember that it may hurt a little more tomorrow but should improve over the next few days.  If you notice any increased warmth, drainage from the injection site please seek medical attention.  Regarding your fatigue we are going to check some lab work and if there are any abnormalities I will call you with those results, if not I will send you a letter and it may arrive in 1 to 2 weeks.  Regarding your sleep apnea I have put in a referral for pulmonology and this referral may take 2 weeks to process and then someone will call you.  If you have any issues, questions, concerns please feel free to call our clinic.  I hope you have a wonderful afternoon!   Knee Injection A knee injection is a procedure to get medicine into your knee joint to relieve the pain, swelling, and stiffness of arthritis. Your health care provider uses a needle to inject medicine, which may also help to lubricate and cushion your knee joint. You may need more than one injection. Tell a health care provider about:  Any allergies you have.  All medicines you are taking, including vitamins, herbs, eye drops, creams, and over-the-counter medicines.  Any problems you or family members have had with anesthetic medicines.  Any blood disorders you have.  Any surgeries you have had.  Any medical conditions you have.  Whether you are pregnant or may be pregnant. What are the risks? Generally, this is a safe procedure. However, problems may occur, including:  Infection.  Bleeding.  Symptoms that get worse.  Damage to the area around your knee.  Allergic reaction to any of the medicines.  Skin reactions from repeated injections. What happens before the procedure?  Ask your health care provider about changing or stopping your regular medicines. This is especially important if you  are taking diabetes medicines or blood thinners.  Plan to have someone take you home from the hospital or clinic. What happens during the procedure?   You will sit or lie down in a position for your knee to be treated.  The skin over your kneecap will be cleaned with a germ-killing soap.  You will be given a medicine that numbs the area (local anesthetic). You may feel some stinging.  The medicine will be injected into your knee. The needle is carefully placed between your kneecap and your knee. The medicine is injected into the joint space.  The needle will be removed at the end of the procedure.  A bandage (dressing) may be placed over the injection site. The procedure may vary among health care providers and hospitals. What can I expect after the procedure?  Your blood pressure, heart rate, breathing rate, and blood oxygen level will be monitored until you leave the hospital or clinic.  You may have to move your knee through its full range of motion. This helps to get all the medicine into your joint space.  You will be watched to make sure that you do not have a reaction to the injected medicine.  You may feel more pain, swelling, and warmth than you did before the injection. This reaction may last about 1-2 days. Follow these instructions at home: Medicines  Take over-the-counter and prescription medicines only as told by your doctor.  Do not drive or use heavy machinery  while taking prescription pain medicine.  Do not take medicines such as aspirin and ibuprofen unless your health care provider tells you to take them. Injection site care  Follow instructions from your health care provider about: ? How to take care of your puncture site. ? When and how you should change your dressing. ? When you should remove your dressing.  Check your injection area every day for signs of infection. Check for: ? More redness, swelling, or pain after 2 days. ? Fluid or blood. ? Pus  or a bad smell. ? Warmth. Managing pain, stiffness, and swelling   If directed, put ice on the injection area: ? Put ice in a plastic bag. ? Place a towel between your skin and the bag. ? Leave the ice on for 20 minutes, 2-3 times per day.  Do not apply heat to your knee.  Raise (elevate) the injection area above the level of your heart while you are sitting or lying down. General instructions  If you were given a dressing, keep it dry until your health care provider says it can be removed. Ask your health care provider when you can start showering or taking a bath.  Avoid strenuous activities for as long as directed by your health care provider. Ask your health care provider when you can return to your normal activities.  Keep all follow-up visits as told by your health care provider. This is important. You may need more injections. Contact a health care provider if you have:  A fever.  Warmth in your injection area.  Fluid, blood, or pus coming from your injection site.  Symptoms at your injection site that last longer than 2 days after your procedure. Get help right away if:  Your knee: ? Turns very red. ? Becomes very swollen. ? Is in severe pain. Summary  A knee injection is a procedure to get medicine into your knee joint to relieve the pain, swelling, and stiffness of arthritis.  A needle is carefully placed between your kneecap and your knee to inject medicine into the joint space.  Before the procedure, ask your health care provider about changing or stopping your regular medicines, especially if you are taking diabetes medicines or blood thinners.  Contact your health care provider if you have any problems or questions after your procedure. This information is not intended to replace advice given to you by your health care provider. Make sure you discuss any questions you have with your health care provider. Document Revised: 02/28/2017 Document Reviewed:  02/28/2017 Elsevier Patient Education  2020 [place] With Sleep Apnea Sleep apnea is a condition in which breathing pauses or becomes shallow during sleep. Sleep apnea is most commonly caused by a collapsed or blocked airway. People with sleep apnea snore loudly and have times when they gasp and stop breathing for 10 seconds or more during sleep. This happens over and over during the night. This disrupts your sleep and keeps your body from getting the rest that it needs, which can cause tiredness and lack of energy (fatigue) during the day. The breaks in breathing also interrupt the deep sleep that you need to feel rested. Even if you do not completely wake up from the gaps in breathing, your sleep may not be restful. You may also have a headache in the morning and low energy during the day, and you may feel anxious or depressed. How can sleep apnea affect me? Sleep apnea increases your chances of extreme  tiredness during the day (daytime fatigue). It can also increase your risk for health conditions, such as:  Heart attack.  Stroke.  Diabetes.  Heart failure.  Irregular heartbeat.  High blood pressure. If you have daytime fatigue as a result of sleep apnea, you may be more likely to:  Perform poorly at school or work.  Fall asleep while driving.  Have difficulty with attention.  Develop depression or anxiety.  Become severely overweight (obese).  Have sexual dysfunction. What actions can I take to manage sleep apnea? Sleep apnea treatment   If you were given a device to open your airway while you sleep, use it only as told by your health care provider. You may be given: ? An oral appliance. This is a custom-made mouthpiece that shifts your lower jaw forward. ? A continuous positive airway pressure (CPAP) device. This device blows air through a mask when you breathe out (exhale). ? A nasal expiratory positive airway pressure (EPAP) device. This device has  valves that you put into each nostril. ? A bi-level positive airway pressure (BPAP) device. This device blows air through a mask when you breathe in (inhale) and breathe out (exhale).  You may need surgery if other treatments do not work for you. Sleep habits  Go to sleep and wake up at the same time every day. This helps set your internal clock (circadian rhythm) for sleeping. ? If you stay up later than usual, such as on weekends, try to get up in the morning within 2 hours of your normal wake time.  Try to get at least 7-9 hours of sleep each night.  Stop computer, tablet, and mobile phone use a few hours before bedtime.  Do not take long naps during the day. If you nap, limit it to 30 minutes.  Have a relaxing bedtime routine. Reading or listening to music may relax you and help you sleep.  Use your bedroom only for sleep. ? Keep your television and computer out of your bedroom. ? Keep your bedroom cool, dark, and quiet. ? Use a supportive mattress and pillows.  Follow your health care provider's instructions for other changes to sleep habits. Nutrition  Do not eat heavy meals in the evening.  Do not have caffeine in the later part of the day. The effects of caffeine can last for more than 5 hours.  Follow your health care provider's or dietitian's instructions for any diet changes. Lifestyle      Do not drink alcohol before bedtime. Alcohol can cause you to fall asleep at first, but then it can cause you to wake up in the middle of the night and have trouble getting back to sleep.  Do not use any products that contain nicotine or tobacco, such as cigarettes and e-cigarettes. If you need help quitting, ask your health care provider. Medicines  Take over-the-counter and prescription medicines only as told by your health care provider.  Do not use over-the-counter sleep medicine. You can become dependent on this medicine, and it can make sleep apnea worse.  Do not use  medicines, such as sedatives and narcotics, unless told by your health care provider. Activity  Exercise on most days, but avoid exercising in the evening. Exercising near bedtime can interfere with sleeping.  If possible, spend time outside every day. Natural light helps regulate your circadian rhythm. General information  Lose weight if you need to, and maintain a healthy weight.  Keep all follow-up visits as told by your health care  provider. This is important.  If you are having surgery, make sure to tell your health care provider that you have sleep apnea. You may need to bring your device with you. Where to find more information Learn more about sleep apnea and daytime fatigue from:  American Sleep Association: sleepassociation.[place]: sleepfoundation.org  National Heart, Lung, and Blood Institute: https://www.[place].biz/ Summary  Sleep apnea can cause daytime fatigue and other serious health conditions.  Both sleep apnea and daytime fatigue can be bad for your health and well-being.  You may need to wear a device while sleeping to help keep your airway open.  If you are having surgery, make sure to tell your health care provider that you have sleep apnea. You may need to bring your device with you.  Making changes to sleep habits, diet, lifestyle, and activity can help you manage sleep apnea. This information is not intended to replace advice given to you by your health care provider. Make sure you discuss any questions you have with your health care provider. Document Revised: 06/02/2018 Document Reviewed: 05/05/2017 Elsevier Patient Education  [place].

## 2019-12-10 NOTE — Assessment & Plan Note (Signed)
History of OSA and has a CPAP which he does not use.  Reports he has had the CPAP for 15 years and feels like he may need to be refitted.  Does not follow with a pulmonologist in the [facility] system and would like a referral for that. -Referral placed to pulmonology

## 2019-12-11 ENCOUNTER — Telehealth: Payer: Self-pay | Admitting: Family Medicine

## 2019-12-11 ENCOUNTER — Ambulatory Visit: Payer: Medicaid Other | Admitting: Podiatry

## 2019-12-11 ENCOUNTER — Encounter: Payer: Self-pay | Admitting: Podiatry

## 2019-12-11 DIAGNOSIS — D689 Coagulation defect, unspecified: Secondary | ICD-10-CM

## 2019-12-11 DIAGNOSIS — M79675 Pain in left toe(s): Secondary | ICD-10-CM

## 2019-12-11 DIAGNOSIS — M79674 Pain in right toe(s): Secondary | ICD-10-CM | POA: Diagnosis not present

## 2019-12-11 DIAGNOSIS — B351 Tinea unguium: Secondary | ICD-10-CM | POA: Diagnosis not present

## 2019-12-11 DIAGNOSIS — E1151 Type 2 diabetes mellitus with diabetic peripheral angiopathy without gangrene: Secondary | ICD-10-CM | POA: Diagnosis not present

## 2019-12-11 LAB — CBC
Hematocrit: 40.7 % (ref 37.5–51.0)
Hemoglobin: 13.3 g/dL (ref 13.0–17.7)
MCH: 28.3 pg (ref 26.6–33.0)
MCHC: 32.7 g/dL (ref 31.5–35.7)
MCV: 87 fL (ref 79–97)
Platelets: 281 10*3/uL (ref 150–450)
RBC: 4.7 x10E6/uL (ref 4.14–5.80)
RDW: 14.5 % (ref 11.6–15.4)
WBC: 10.2 10*3/uL (ref 3.4–10.8)

## 2019-12-11 LAB — BASIC METABOLIC PANEL
BUN/Creatinine Ratio: 22 (ref 10–24)
BUN: 34 mg/dL — ABNORMAL HIGH (ref 8–27)
CO2: 20 mmol/L (ref 20–29)
Calcium: 9.2 mg/dL (ref 8.6–10.2)
Chloride: 100 mmol/L (ref 96–106)
Creatinine, Ser: 1.58 mg/dL — ABNORMAL HIGH (ref 0.76–1.27)
GFR calc Af Amer: 54 mL/min/{1.73_m2} — ABNORMAL LOW (ref >59)
GFR calc non Af Amer: 47 mL/min/{1.73_m2} — ABNORMAL LOW (ref >59)
Glucose: 84 mg/dL (ref 65–99)
Potassium: 4.6 mmol/L (ref 3.5–5.2)
Sodium: 136 mmol/L (ref 134–144)

## 2019-12-11 LAB — VITAMIN D 25 HYDROXY (VIT D DEFICIENCY, FRACTURES): Vit D, 25-Hydroxy: 45.8 ng/mL (ref 30.0–100.0)

## 2019-12-11 LAB — TSH: TSH: 0.97 u[IU]/mL (ref 0.450–4.500)

## 2019-12-11 NOTE — Telephone Encounter (Signed)
Called patient and informed him of his test results.  He is grateful for this.  Reports that he was thinking about it and they fatigue may be related to the fact that he is hurting and it makes him feel like he does not want to do much.  We discussed continuing his good work with his weight loss and following up with orthopedics.

## 2019-12-11 NOTE — Progress Notes (Signed)
This patient presents  to my office for at risk foot care.  This patient requires this care by a professional since this patient will be at risk due to having diabetes, peripheral neuropathy and coagulation defect.  Patient is taking plavix.  This patient is unable to cut nails himself since the patient cannot reach his nails.These nails are painful walking and wearing shoes.  This patient presents for at risk foot care today. He has had nail surgery for the removal of nail plate left foot.  General Appearance  Alert, conversant and in no acute stress.  Vascular  Dorsalis pedis and posterior tibial  pulses are weakly  palpable  bilaterally.  Capillary return is within normal limits  bilaterally. Temperature is within normal limits  bilaterally.  Neurologic  Senn-Weinstein monofilament wire test within normal limits  bilaterally. Muscle power within normal limits bilaterally.  Nails Thick disfigured discolored nails with subungual debris  from hallux to fifth toes right and 2-5 left.  Healing nail bed left foot.. No evidence of bacterial infection or drainage bilaterally.  Orthopedic  No limitations of motion  feet .  No crepitus or effusions noted.  No bony pathology or digital deformities noted.  HAV  Left.  Pes planus.  Skin  normotropic skin with no porokeratosis noted bilaterally.  No signs of infections or ulcers noted.     Onychomycosis  Pain in right toes  Pain in left toes  Consent was obtained for treatment procedures.   Mechanical debridement of nails bilaterally performed with a nail nipper.  Filed with dremel without incident.    Return office visit  3 months                    Told patient to return for periodic foot care and evaluation due to potential at risk complications.   Gardiner Barefoot DPM

## 2019-12-12 ENCOUNTER — Other Ambulatory Visit (HOSPITAL_COMMUNITY): Payer: Self-pay | Admitting: Internal Medicine

## 2019-12-17 ENCOUNTER — Other Ambulatory Visit (HOSPITAL_COMMUNITY): Payer: Self-pay

## 2019-12-17 MED FILL — VYNDAMAX 61 MG CAPS: 61 | 30 days supply | Qty: 30 | Fill #0

## 2019-12-17 NOTE — Progress Notes (Signed)
Paramedicine Encounter    Patient ID: Russell Collier, male    DOB: 01/31/1961, 59 y.o.   MRN: [id number]   Patient Care Team: [name], MD as PCP - General (Family Medicine) [name], MD as PCP - Cardiology (Cardiology) Michael Boston, MD as Consulting Physician (General Surgery) Wilford Corner, MD as Consulting Physician (Gastroenterology) Jorge Ny, LCSW as Social Worker (Licensed Clinical Social Worker) Alda Berthold, DO as Consulting Physician (Neurology) Jola Baptist, [place] as Referring Physician (Chiropractic Medicine)  Patient Active Problem List   Diagnosis Date Noted  . Coagulation defect ([place]) 12/11/2019  . Fatigue 12/10/2019  . Leg cramps 12/10/2019  . Mild epistaxis 10/30/2019  . Lower extremity edema   . 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 Acute respiratory failure ([place]) 07/27/2019  . Breast tenderness in male 05/31/2019  . Chronic pain 05/31/2019  . AKI (acute kidney injury) ([place]) 05/31/2019  . Pain due to onychomycosis of toenails of both feet 05/16/2019  . Chronic knee pain 04/16/2019  . Diabetes ([place]) 04/16/2019  . Esophageal reflux 02/06/2019  . Heartburn 02/06/2019  . Chronic skin ulcer of lower leg ([place]) 12/21/2018  . S/P drug eluting coronary stent placement   . Coronary artery disease involving native coronary artery of native heart with unstable angina pectoris ([place]) 12/20/2018  . Unstable angina ([place])   . 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 Acute on chronic diastolic heart failure ([place])   . Shortness of breath   . Precordial chest pain   . Idiopathic peripheral neuropathy   . Palpitations   . Chronic diastolic heart failure ([place])   . Abnormal ankle brachial index (ABI)   . Chest pain, rule out acute myocardial infarction 09/26/2018  . Dyspepsia   . Morbid obesity ([place])   . OSA on CPAP   . Chest pain 05/30/2018  . CAD (coronary artery disease) 03/30/2018  . Elevated troponin 02/03/2018  . Positive urine drug screen 02/03/2018   . Tobacco abuse 02/03/2018  . Hyperlipidemia 02/03/2018  . Acute respiratory failure with hypoxia ([place]) 02/02/2018  . Acute congestive heart failure ([place])   . Atypical chest pain 12/27/2015  . Essential hypertension   . Neuropathy     Current Outpatient Medications:  .  Accu-Chek Softclix Lancets lancets, Use as instructed, Disp: 100 each, Rfl: 12 .  acetaminophen (TYLENOL) 650 MG CR tablet, Take 1 tablet (650 mg total) by mouth every 8 (eight) hours as needed for pain., Disp: 270 tablet, Rfl: 0 .  albuterol (PROVENTIL) (2.5 MG/3ML) 0.083% nebulizer solution, Take 2.5 mg by nebulization every 6 (six) hours as needed for wheezing or shortness of breath., Disp: , Rfl:  .  albuterol (VENTOLIN HFA) 108 (90 Base) MCG/ACT inhaler, Inhale 2 puffs into the lungs every 6 (six) hours as needed for wheezing or shortness of breath., Disp: , Rfl:  .  apixaban (ELIQUIS) 5 MG TABS tablet, Take 1 tablet (5 mg total) by mouth 2 (two) times daily., Disp: 60 tablet, Rfl: 11 .  atorvastatin (LIPITOR) 80 MG tablet, Take 1 tablet (80 mg total) by mouth daily., Disp: 90 tablet, Rfl: 3 .  baclofen (LIORESAL) 10 MG tablet, Take 5-10 mg by mouth 3 (three) times daily as needed. , Disp: , Rfl:  .  Blood Glucose Monitoring Suppl (ACCU-CHEK GUIDE) w/Device KIT, 1 Units by Does not apply route daily., Disp: 1 kit, Rfl: 0 .  clopidogrel (PLAVIX) 75 MG tablet, Take 1 tablet (75 mg total) by mouth daily with breakfast., Disp: 90 tablet,  Rfl: 3 .  dapagliflozin propanediol (FARXIGA) 10 MG TABS tablet, Take 1 tablet (10 mg total) by mouth daily before breakfast., Disp: 30 tablet, Rfl: 6 .  diazepam (VALIUM) 5 MG tablet, Take 1 by mouth 1 hour  pre-procedure with very light food. May bring 2nd tablet to appointment., Disp: 2 tablet, Rfl: 0 .  enalapril (VASOTEC) 20 MG tablet, Take 20 mg by mouth at bedtime., Disp: , Rfl:  .  fluticasone (FLONASE) 50 MCG/ACT nasal spray, PLACE 1 SPRAY INTO BOTH NOSTRILS DAILY., Disp: 16 g, Rfl:  11 .  furosemide (LASIX) 40 MG tablet, TAKE ONE TABLET BY MOUTH TWICE DAILY, Disp: 180 tablet, Rfl: 3 .  glucose blood (ACCU-CHEK GUIDE) test strip, Use as instructed, Disp: 100 each, Rfl: 12 .  isosorbide mononitrate (IMDUR) 30 MG 24 hr tablet, Take 1 tablet (30 mg total) by mouth daily., Disp: 90 tablet, Rfl: 3 .  lidocaine (LIDODERM) 5 %, Place 1 patch onto the skin daily. Remove & Discard patch within 12 hours or as directed by MD, Disp: 5 patch, Rfl: 0 .  Multiple Vitamin (MULTIVITAMIN WITH MINERALS) TABS tablet, Take 1 tablet by mouth daily., Disp: , Rfl:  .  nicotine (NICODERM CQ - DOSED IN MG/24 HOURS) 21 mg/24hr patch, Place 1 patch (21 mg total) onto the skin daily., Disp: 28 patch, Rfl: 2 .  oxymetazoline (AFRIN NASAL SPRAY) 0.05 % nasal spray, Place 2 sprays into both nostrils daily. Only use when having bleeding., Disp: 30 mL, Rfl: 0 .  pantoprazole (PROTONIX) 40 MG tablet, Take 1 tablet (40 mg total) by mouth daily., Disp: 90 tablet, Rfl: 0 .  potassium chloride SA (KLOR-CON) 20 MEQ tablet, TAKE 1 TABLET(20 MEQ) BY MOUTH DAILY (Patient taking differently: Take 20 mEq by mouth daily. TAKE 1 TABLET(20 MEQ) BY MOUTH DAILY), Disp: 90 tablet, Rfl: 3 .  pregabalin (LYRICA) 100 MG capsule, TAKE ONE CAPSULE BY MOUTH THREE TIMES DAILY, Disp: 270 capsule, Rfl: 2 .  umeclidinium-vilanterol (ANORO ELLIPTA) 62.5-25 MCG/INH AEPB, Inhale 1 puff into the lungs daily., Disp: 60 each, Rfl: 2 .  valACYclovir (VALTREX) 500 MG tablet, Take 1 tablet (500 mg total) by mouth daily., Disp: 90 tablet, Rfl: 3 .  VYNDAMAX 61 MG CAPS, TAKE 1 CAPSULE BY MOUTH DAILY., Disp: 30 capsule, Rfl: 11  Current Facility-Administered Medications:  .  methylPREDNISolone acetate (DEPO-MEDROL) injection 40 mg, 40 mg, Intramuscular, Once, [name], MD Allergies  Allergen Reactions  . Varenicline Other (See Comments)    Patient reports laryngospasm stopped in the ER  . Bupropion Other (See Comments)    Headache -  moderate/severe - self discontinued agent      Social History   Socioeconomic History  . Marital status: Divorced    Spouse name: Not on file  . Number of children: 2  . Years of education: 10  . Highest education level: Not on file  Occupational History  . Occupation: not employed  Tobacco Use  . Smoking status: Current Every Day Smoker    Packs/day: 0.50    Years: 54.00    Pack years: 27.00    Types: Cigarettes, Cigars  . Smokeless tobacco: Never Used  . Tobacco comment: half pack daily  Vaping Use  . Vaping Use: Never used  Substance and Sexual Activity  . Alcohol use: Yes    Alcohol/week: 4.0 standard drinks    Types: 2 Cans of beer, 2 Shots of liquor per week  . Drug use: Not Currently    Comment:  last use may last year  . Sexual activity: Yes  Other Topics Concern  . Not on file  Social History Narrative   Right handed   Two story home   No caffeine   Social Determinants of Health   Financial Resource Strain: Low Risk   . Difficulty of Paying Living Expenses: Not very hard  Food Insecurity: No Food Insecurity  . Worried About Charity fundraiser in the Last Year: Never true  . Ran Out of Food in the Last Year: Never true  Transportation Needs: No Transportation Needs  . Lack of Transportation (Medical): No  . Lack of Transportation (Non-Medical): No  Physical Activity:   . Days of Exercise per Week: Not on file  . Minutes of Exercise per Session: Not on file  Stress:   . Feeling of Stress : Not on file  Social Connections:   . Frequency of Communication with Friends and Family: Not on file  . Frequency of Social Gatherings with Friends and Family: Not on file  . Attends Religious Services: Not on file  . Active Member of Clubs or Organizations: Not on file  . Attends Archivist Meetings: Not on file  . Marital Status: Not on file  Intimate Partner Violence:   . Fear of Current or Ex-Partner: Not on file  . Emotionally Abused: Not on file   . Physically Abused: Not on file  . Sexually Abused: Not on file    Physical Exam Vitals reviewed.  Constitutional:      Appearance: He is normal weight.  HENT:     Head: Normocephalic.     Nose: Nose normal.     Mouth/Throat:     Mouth: Mucous membranes are moist.     Pharynx: Oropharynx is clear.  Eyes:     Pupils: Pupils are equal, round, and reactive to light.  Cardiovascular:     Rate and Rhythm: Normal rate and regular rhythm.     Pulses: Normal pulses.     Heart sounds: Normal heart sounds.  Pulmonary:     Effort: Pulmonary effort is normal.     Breath sounds: Normal breath sounds.  Abdominal:     Palpations: Abdomen is soft.  Musculoskeletal:        General: Normal range of motion.     Cervical back: Normal range of motion.     Right lower leg: Edema present.     Left lower leg: No edema.  Skin:    General: Skin is warm and dry.     Capillary Refill: Capillary refill takes less than 2 seconds.  Neurological:     Mental Status: He is alert. Mental status is at baseline.  Psychiatric:        Mood and Affect: Mood normal.     Arrived for home visit for Russell Collier who was alert and oriented seated in his kitchen reporting no complaints. Russell Collier had some lower leg swelling in his right lower leg. Russell Collier reports he has been drinking water and alcohol (on the weekends) and eating pork. I educated Russell Collier on the importance of his diet and fluid intake. He agreed and verbalized understanding. Vitals were obtained and are as noted in report. Medications verified and confirmed. Pill box filled. Potassium needed Thursday-Mon AM, Russell Collier understood. Lung sounds clear with no JVD. Russell Collier stated he will be calling to make his Ortho appointment and does not require my assistance for same. Home visit complete and I will see patient  in one week.   REFILLS- Isosorbide Enalapril Pantoprazole     Future Appointments  Date Time Provider [place]  01/31/2020  3:00  PM [name], [name], MD [facility] None  03/18/2020  3:45 PM Gardiner Barefoot, DPM [facility]     ACTION: Home visit completed Next visit planned for one week

## 2019-12-17 NOTE — Progress Notes (Signed)
Paramedicine Encounter  Vitals added for today's encounter.   ACTION: Home visit completed

## 2019-12-20 ENCOUNTER — Telehealth: Payer: Self-pay | Admitting: Pharmacist

## 2019-12-20 ENCOUNTER — Telehealth (HOSPITAL_COMMUNITY): Payer: Self-pay | Admitting: *Deleted

## 2019-12-20 NOTE — Telephone Encounter (Signed)
Received clearance request from Dr Hoyt Koch, pt needs clearance for alveoloplasty maxilla and mandible under local/nitrous  Per Dr Haroldine Laws: "pt cleared, can hold eliquis as needed avoid oversedation"  Form faxed back to them at [phone]

## 2019-12-20 NOTE — Telephone Encounter (Signed)
Patient contacted RE tobacco reduction/cessation planning.   Patient reports he is still smoking 10-11 cigs per day.  He has stopped using patches.   He states is pain is slightly improved post back procedure but continues to have pain with any movement.   He continues to verbalize plan to quit in the near future BUT has made minimal progress recently.   I agreed to follow-up with him again.  Plan to contact him in ~ 3 weeks to assess progress.

## 2019-12-24 ENCOUNTER — Other Ambulatory Visit (HOSPITAL_COMMUNITY): Payer: Self-pay

## 2019-12-24 NOTE — Progress Notes (Signed)
Paramedicine Encounter    Patient ID: Russell Collier, male    DOB: November 16, 1960, 59 y.o.   MRN: [id number]   Patient Care Team: [name], MD as PCP - General (Family Medicine) [name], MD as PCP - Cardiology (Cardiology) Michael Boston, MD as Consulting Physician (General Surgery) Wilford Corner, MD as Consulting Physician (Gastroenterology) Jorge Ny, LCSW as Social Worker (Licensed Clinical Social Worker) Alda Berthold, DO as Consulting Physician (Neurology) Jola Baptist, [place] as Referring Physician (Chiropractic Medicine)  Patient Active Problem List   Diagnosis Date Noted   Coagulation defect ([place]) 12/11/2019   Fatigue 12/10/2019   Leg cramps 12/10/2019   Mild epistaxis 10/30/2019   Lower extremity edema    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[place])    Acute respiratory failure ([place]) 07/27/2019   Breast tenderness in male 05/31/2019   Chronic pain 05/31/2019   AKI (acute kidney injury) ([place]) 05/31/2019   Pain due to onychomycosis of toenails of both feet 05/16/2019   Chronic knee pain 04/16/2019   Diabetes ([place]) 04/16/2019   Esophageal reflux 02/06/2019   Heartburn 02/06/2019   Chronic skin ulcer of lower leg ([place]) 12/21/2018   S/P drug eluting coronary stent placement    Coronary artery disease involving native coronary artery of native heart with unstable angina pectoris ([place]) 12/20/2018   Unstable angina ([place])    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   Acute on chronic diastolic heart failure ([place])    Shortness of breath    Precordial chest pain    Idiopathic peripheral neuropathy    Palpitations    Chronic diastolic heart failure ([facility])    Abnormal ankle brachial index (ABI)    Chest pain, rule out acute myocardial infarction 09/26/2018   Dyspepsia    Morbid obesity ([place])    OSA on CPAP    Chest pain 05/30/2018   CAD (coronary artery disease) 03/30/2018   Elevated troponin 02/03/2018   Positive urine drug screen 02/03/2018    Tobacco abuse 02/03/2018   Hyperlipidemia 02/03/2018   Acute respiratory failure with hypoxia ([facility]) 02/02/2018   Acute congestive heart failure ([facility])    Atypical chest pain 12/27/2015   Essential hypertension    Neuropathy     Current Outpatient Medications:    Accu-Chek Softclix Lancets lancets, Use as instructed, Disp: 100 each, Rfl: 12   acetaminophen (TYLENOL) 650 MG CR tablet, Take 1 tablet (650 mg total) by mouth every 8 (eight) hours as needed for pain., Disp: 270 tablet, Rfl: 0   albuterol (PROVENTIL) (2.5 MG/3ML) 0.083% nebulizer solution, Take 2.5 mg by nebulization every 6 (six) hours as needed for wheezing or shortness of breath., Disp: , Rfl:    albuterol (VENTOLIN HFA) 108 (90 Base) MCG/ACT inhaler, Inhale 2 puffs into the lungs every 6 (six) hours as needed for wheezing or shortness of breath., Disp: , Rfl:    apixaban (ELIQUIS) 5 MG TABS tablet, Take 1 tablet (5 mg total) by mouth 2 (two) times daily., Disp: 60 tablet, Rfl: 11   atorvastatin (LIPITOR) 80 MG tablet, Take 1 tablet (80 mg total) by mouth daily., Disp: 90 tablet, Rfl: 3   baclofen (LIORESAL) 10 MG tablet, Take 5-10 mg by mouth 3 (three) times daily as needed.  (Patient not taking: Reported on 12/17/2019), Disp: , Rfl:    Blood Glucose Monitoring Suppl (ACCU-CHEK GUIDE) w/Device KIT, 1 Units by Does not apply route daily., Disp: 1 kit, Rfl: 0   clopidogrel (PLAVIX) 75 MG tablet, Take 1 tablet (75 mg total) by mouth  daily with breakfast., Disp: 90 tablet, Rfl: 3   dapagliflozin propanediol (FARXIGA) 10 MG TABS tablet, Take 1 tablet (10 mg total) by mouth daily before breakfast., Disp: 30 tablet, Rfl: 6   diazepam (VALIUM) 5 MG tablet, Take 1 by mouth 1 hour  pre-procedure with very light food. May bring 2nd tablet to appointment. (Patient not taking: Reported on 12/17/2019), Disp: 2 tablet, Rfl: 0   enalapril (VASOTEC) 20 MG tablet, Take 20 mg by mouth at bedtime., Disp: , Rfl:    fluticasone  (FLONASE) 50 MCG/ACT nasal spray, PLACE 1 SPRAY INTO BOTH NOSTRILS DAILY., Disp: 16 g, Rfl: 11   furosemide (LASIX) 40 MG tablet, TAKE ONE TABLET BY MOUTH TWICE DAILY, Disp: 180 tablet, Rfl: 3   glucose blood (ACCU-CHEK GUIDE) test strip, Use as instructed, Disp: 100 each, Rfl: 12   isosorbide mononitrate (IMDUR) 30 MG 24 hr tablet, Take 1 tablet (30 mg total) by mouth daily., Disp: 90 tablet, Rfl: 3   lidocaine (LIDODERM) 5 %, Place 1 patch onto the skin daily. Remove & Discard patch within 12 hours or as directed by MD (Patient not taking: Reported on 12/17/2019), Disp: 5 patch, Rfl: 0   Multiple Vitamin (MULTIVITAMIN WITH MINERALS) TABS tablet, Take 1 tablet by mouth daily., Disp: , Rfl:    nicotine (NICODERM CQ - DOSED IN MG/24 HOURS) 21 mg/24hr patch, Place 1 patch (21 mg total) onto the skin daily. (Patient not taking: Reported on 12/17/2019), Disp: 28 patch, Rfl: 2   oxymetazoline (AFRIN NASAL SPRAY) 0.05 % nasal spray, Place 2 sprays into both nostrils daily. Only use when having bleeding. (Patient not taking: Reported on 12/17/2019), Disp: 30 mL, Rfl: 0   pantoprazole (PROTONIX) 40 MG tablet, Take 1 tablet (40 mg total) by mouth daily., Disp: 90 tablet, Rfl: 0   potassium chloride SA (KLOR-CON) 20 MEQ tablet, TAKE 1 TABLET(20 MEQ) BY MOUTH DAILY (Patient taking differently: Take 20 mEq by mouth daily. TAKE 1 TABLET(20 MEQ) BY MOUTH DAILY), Disp: 90 tablet, Rfl: 3   pregabalin (LYRICA) 100 MG capsule, TAKE ONE CAPSULE BY MOUTH THREE TIMES DAILY, Disp: 270 capsule, Rfl: 2   umeclidinium-vilanterol (ANORO ELLIPTA) 62.5-25 MCG/INH AEPB, Inhale 1 puff into the lungs daily., Disp: 60 each, Rfl: 2   valACYclovir (VALTREX) 500 MG tablet, Take 1 tablet (500 mg total) by mouth daily., Disp: 90 tablet, Rfl: 3   VYNDAMAX 61 MG CAPS, TAKE 1 CAPSULE BY MOUTH DAILY., Disp: 30 capsule, Rfl: 11  Current Facility-Administered Medications:    methylPREDNISolone acetate (DEPO-MEDROL) injection 40  mg, 40 mg, Intramuscular, Once, [name], MD Allergies  Allergen Reactions   Varenicline Other (See Comments)    Patient reports laryngospasm stopped in the ER   Bupropion Other (See Comments)    Headache - moderate/severe - self discontinued agent      Social History   Socioeconomic History   Marital status: Divorced    Spouse name: Not on file   Number of children: 2   Years of education: 10   Highest education level: Not on file  Occupational History   Occupation: not employed  Tobacco Use   Smoking status: Current Every Day Smoker    Packs/day: 0.50    Years: 54.00    Pack years: 27.00    Types: Cigarettes, Cigars   Smokeless tobacco: Never Used   Tobacco comment: half pack daily  Vaping Use   Vaping Use: Never used  Substance and Sexual Activity   Alcohol use: Yes  Alcohol/week: 4.0 standard drinks    Types: 2 Cans of beer, 2 Shots of liquor per week   Drug use: Not Currently    Comment: last use may last year   Sexual activity: Yes  Other Topics Concern   Not on file  Social History Narrative   Right handed   Two story home   No caffeine   Social Determinants of Health   Financial Resource Strain: Low Risk    Difficulty of Paying Living Expenses: Not very hard  Food Insecurity: No Food Insecurity   Worried About Charity fundraiser in the Last Year: Never true   Ran Out of Food in the Last Year: Never true  Transportation Needs: No Transportation Needs   Lack of Transportation (Medical): No   Lack of Transportation (Non-Medical): No  Physical Activity:    Days of Exercise per Week: Not on file   Minutes of Exercise per Session: Not on file  Stress:    Feeling of Stress : Not on file  Social Connections:    Frequency of Communication with Friends and Family: Not on file   Frequency of Social Gatherings with Friends and Family: Not on file   Attends Religious Services: Not on file   Active Member of Clubs or  Organizations: Not on file   Attends Archivist Meetings: Not on file   Marital Status: Not on file  Intimate Partner Violence:    Fear of Current or Ex-Partner: Not on file   Emotionally Abused: Not on file   Physically Abused: Not on file   Sexually Abused: Not on file    Physical Exam Vitals reviewed.  Constitutional:      Appearance: He is normal weight.  HENT:     Head: Normocephalic.     Nose: Nose normal.     Mouth/Throat:     Mouth: Mucous membranes are moist.     Pharynx: Oropharynx is clear.  Eyes:     Pupils: Pupils are equal, round, and reactive to light.  Cardiovascular:     Rate and Rhythm: Normal rate and regular rhythm.     Pulses: Normal pulses.     Heart sounds: Normal heart sounds.  Pulmonary:     Effort: Pulmonary effort is normal.     Breath sounds: Normal breath sounds.  Abdominal:     General: Abdomen is flat.     Palpations: Abdomen is soft.  Musculoskeletal:        General: No swelling. Normal range of motion.     Cervical back: Normal range of motion.     Right lower leg: No edema.     Left lower leg: No edema.  Skin:    General: Skin is warm and dry.     Capillary Refill: Capillary refill takes less than 2 seconds.  Neurological:     General: No focal deficit present.     Mental Status: He is alert. Mental status is at baseline.  Psychiatric:        Mood and Affect: Mood normal.      Arrived for home visit for Russell Collier who was alert and oriented with complaints of lower back pain which is chronic for him. I assisted in scheduling appointment with Dr. Linton Rump for Russell Collier's issues with his back. Vitals were obtained and are as noted. Russell Collier medications were reviewed and confirmed. 2 pill boxes filled due to by absence next week. Russell Collier reminded of upcoming appointments. No swelling noted lung  sounds clear. Russell Collier reports taking Tylenol for pain with no relief. I suggested Lidocaine patches for same. Dejay agreed to  home visit in two weeks. Home visit complete.    Refill: Eliquis (next week)   CBG- 101  Future Appointments  Date Time Provider [place]  01/31/2020  3:00 PM [name], [name], MD [facility] None  03/18/2020  3:45 PM Gardiner Barefoot, DPM [facility]     ACTION: Home visit completed Next visit planned for two weeks

## 2020-01-02 ENCOUNTER — Telehealth (HOSPITAL_COMMUNITY): Payer: Self-pay | Admitting: *Deleted

## 2020-01-02 DIAGNOSIS — Z6841 Body Mass Index (BMI) 40.0 and over, adult: Secondary | ICD-10-CM | POA: Diagnosis not present

## 2020-01-02 DIAGNOSIS — M4316 Spondylolisthesis, lumbar region: Secondary | ICD-10-CM | POA: Diagnosis not present

## 2020-01-02 DIAGNOSIS — M48061 Spinal stenosis, lumbar region without neurogenic claudication: Secondary | ICD-10-CM | POA: Diagnosis not present

## 2020-01-02 DIAGNOSIS — E119 Type 2 diabetes mellitus without complications: Secondary | ICD-10-CM | POA: Diagnosis not present

## 2020-01-02 NOTE — Telephone Encounter (Signed)
Contacted patient as requested from PM, Heather. Patient inquired about exercise and if there were programs available for him. After discussing patient's state of back and knee pain and associate limitations, we will put exercise programming on hold. Patient's request to hold with [facility] until he can see improvements from following with orthopedic. Provided contact information and will try to visit patient at next clinic visit (12/9 at 1500)    Landis Martins, MS, ACSM, [facility] Clinical Exercise Physiologist/ Health and [place]

## 2020-01-07 ENCOUNTER — Other Ambulatory Visit (HOSPITAL_COMMUNITY): Payer: Self-pay

## 2020-01-07 NOTE — Progress Notes (Signed)
Paramedicine Encounter    Patient ID: Russell Collier, male    DOB: 18-Aug-1960, 59 y.o.   MRN: [id number]   Patient Care Team: Derrel Nip, MD as PCP - General (Family Medicine) Quintella Reichert, MD as PCP - Cardiology (Cardiology) Karie Soda, MD as Consulting Physician (General Surgery) Charlott Rakes, MD as Consulting Physician (Gastroenterology) Burna Sis, LCSW as Social Worker (Licensed Clinical Social Worker) Glendale Chard, DO as Consulting Physician (Neurology) Genene Churn, DC as Referring Physician (Chiropractic Medicine)  Patient Active Problem List   Diagnosis Date Noted  . Coagulation defect (HCC) 12/11/2019  . Fatigue 12/10/2019  . Leg cramps 12/10/2019  . Mild epistaxis 10/30/2019  . Lower extremity edema   . 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HCC)   . Acute respiratory failure (HCC) 07/27/2019  . Breast tenderness in male 05/31/2019  . Chronic pain 05/31/2019  . AKI (acute kidney injury) (HCC) 05/31/2019  . Pain due to onychomycosis of toenails of both feet 05/16/2019  . Chronic knee pain 04/16/2019  . Diabetes (HCC) 04/16/2019  . Esophageal reflux 02/06/2019  . Heartburn 02/06/2019  . Chronic skin ulcer of lower leg (HCC) 12/21/2018  . S/P drug eluting coronary stent placement   . Coronary artery disease involving native coronary artery of native heart with unstable angina pectoris (HCC) 12/20/2018  . Unstable angina (HCC)   . 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 Acute on chronic diastolic heart failure (HCC)   . Shortness of breath   . Precordial chest pain   . Idiopathic peripheral neuropathy   . Palpitations   . Chronic diastolic heart failure (HCC)   . Abnormal ankle brachial index (ABI)   . Chest pain, rule out acute myocardial infarction 09/26/2018  . Dyspepsia   . Morbid obesity (HCC)   . OSA on CPAP   . Chest pain 05/30/2018  . CAD (coronary artery disease) 03/30/2018  . Elevated troponin 02/03/2018  . Positive urine drug screen 02/03/2018   . Tobacco abuse 02/03/2018  . Hyperlipidemia 02/03/2018  . Acute respiratory failure with hypoxia (HCC) 02/02/2018  . Acute congestive heart failure (HCC)   . Atypical chest pain 12/27/2015  . Essential hypertension   . Neuropathy     Current Outpatient Medications:  .  Accu-Chek Softclix Lancets lancets, Use as instructed, Disp: 100 each, Rfl: 12 .  acetaminophen (TYLENOL) 650 MG CR tablet, Take 1 tablet (650 mg total) by mouth every 8 (eight) hours as needed for pain., Disp: 270 tablet, Rfl: 0 .  albuterol (PROVENTIL) (2.5 MG/3ML) 0.083% nebulizer solution, Take 2.5 mg by nebulization every 6 (six) hours as needed for wheezing or shortness of breath., Disp: , Rfl:  .  albuterol (VENTOLIN HFA) 108 (90 Base) MCG/ACT inhaler, Inhale 2 puffs into the lungs every 6 (six) hours as needed for wheezing or shortness of breath., Disp: , Rfl:  .  apixaban (ELIQUIS) 5 MG TABS tablet, Take 1 tablet (5 mg total) by mouth 2 (two) times daily., Disp: 60 tablet, Rfl: 11 .  atorvastatin (LIPITOR) 80 MG tablet, Take 1 tablet (80 mg total) by mouth daily., Disp: 90 tablet, Rfl: 3 .  baclofen (LIORESAL) 10 MG tablet, Take 5-10 mg by mouth 3 (three) times daily as needed.  (Patient not taking: Reported on 12/17/2019), Disp: , Rfl:  .  Blood Glucose Monitoring Suppl (ACCU-CHEK GUIDE) w/Device KIT, 1 Units by Does not apply route daily., Disp: 1 kit, Rfl: 0 .  clopidogrel (PLAVIX) 75 MG tablet, Take 1 tablet (75 mg total) by mouth  daily with breakfast., Disp: 90 tablet, Rfl: 3 .  dapagliflozin propanediol (FARXIGA) 10 MG TABS tablet, Take 1 tablet (10 mg total) by mouth daily before breakfast., Disp: 30 tablet, Rfl: 6 .  diazepam (VALIUM) 5 MG tablet, Take 1 by mouth 1 hour  pre-procedure with very light food. May bring 2nd tablet to appointment. (Patient not taking: Reported on 12/17/2019), Disp: 2 tablet, Rfl: 0 .  enalapril (VASOTEC) 20 MG tablet, Take 20 mg by mouth at bedtime., Disp: , Rfl:  .  fluticasone  (FLONASE) 50 MCG/ACT nasal spray, PLACE 1 SPRAY INTO BOTH NOSTRILS DAILY., Disp: 16 g, Rfl: 11 .  furosemide (LASIX) 40 MG tablet, TAKE ONE TABLET BY MOUTH TWICE DAILY, Disp: 180 tablet, Rfl: 3 .  glucose blood (ACCU-CHEK GUIDE) test strip, Use as instructed, Disp: 100 each, Rfl: 12 .  isosorbide mononitrate (IMDUR) 30 MG 24 hr tablet, Take 1 tablet (30 mg total) by mouth daily., Disp: 90 tablet, Rfl: 3 .  lidocaine (LIDODERM) 5 %, Place 1 patch onto the skin daily. Remove & Discard patch within 12 hours or as directed by MD (Patient not taking: Reported on 12/17/2019), Disp: 5 patch, Rfl: 0 .  Multiple Vitamin (MULTIVITAMIN WITH MINERALS) TABS tablet, Take 1 tablet by mouth daily., Disp: , Rfl:  .  nicotine (NICODERM CQ - DOSED IN MG/24 HOURS) 21 mg/24hr patch, Place 1 patch (21 mg total) onto the skin daily. (Patient not taking: Reported on 12/17/2019), Disp: 28 patch, Rfl: 2 .  oxymetazoline (AFRIN NASAL SPRAY) 0.05 % nasal spray, Place 2 sprays into both nostrils daily. Only use when having bleeding. (Patient not taking: Reported on 12/17/2019), Disp: 30 mL, Rfl: 0 .  pantoprazole (PROTONIX) 40 MG tablet, Take 1 tablet (40 mg total) by mouth daily., Disp: 90 tablet, Rfl: 0 .  potassium chloride SA (KLOR-CON) 20 MEQ tablet, TAKE 1 TABLET(20 MEQ) BY MOUTH DAILY (Patient taking differently: Take 20 mEq by mouth daily. TAKE 1 TABLET(20 MEQ) BY MOUTH DAILY), Disp: 90 tablet, Rfl: 3 .  pregabalin (LYRICA) 100 MG capsule, TAKE ONE CAPSULE BY MOUTH THREE TIMES DAILY, Disp: 270 capsule, Rfl: 2 .  umeclidinium-vilanterol (ANORO ELLIPTA) 62.5-25 MCG/INH AEPB, Inhale 1 puff into the lungs daily., Disp: 60 each, Rfl: 2 .  valACYclovir (VALTREX) 500 MG tablet, Take 1 tablet (500 mg total) by mouth daily., Disp: 90 tablet, Rfl: 3 .  VYNDAMAX 61 MG CAPS, TAKE 1 CAPSULE BY MOUTH DAILY., Disp: 30 capsule, Rfl: 11  Current Facility-Administered Medications:  .  methylPREDNISolone acetate (DEPO-MEDROL) injection 40  mg, 40 mg, Intramuscular, Once, [name], MD Allergies  Allergen Reactions  . Varenicline Other (See Comments)    Patient reports laryngospasm stopped in the ER  . Bupropion Other (See Comments)    Headache - moderate/severe - self discontinued agent      Social History   Socioeconomic History  . Marital status: Divorced    Spouse name: Not on file  . Number of children: 2  . Years of education: 10  . Highest education level: Not on file  Occupational History  . Occupation: not employed  Tobacco Use  . Smoking status: Current Every Day Smoker    Packs/day: 0.50    Years: 54.00    Pack years: 27.00    Types: Cigarettes, Cigars  . Smokeless tobacco: Never Used  . Tobacco comment: half pack daily  Vaping Use  . Vaping Use: Never used  Substance and Sexual Activity  . Alcohol use: Yes  Alcohol/week: 4.0 standard drinks    Types: 2 Cans of beer, 2 Shots of liquor per week  . Drug use: Not Currently    Comment: last use may last year  . Sexual activity: Yes  Other Topics Concern  . Not on file  Social History Narrative   Right handed   Two story home   No caffeine   Social Determinants of Health   Financial Resource Strain: Low Risk   . Difficulty of Paying Living Expenses: Not very hard  Food Insecurity: No Food Insecurity  . Worried About Charity fundraiser in the Last Year: Never true  . Ran Out of Food in the Last Year: Never true  Transportation Needs: No Transportation Needs  . Lack of Transportation (Medical): No  . Lack of Transportation (Non-Medical): No  Physical Activity:   . Days of Exercise per Week: Not on file  . Minutes of Exercise per Session: Not on file  Stress:   . Feeling of Stress : Not on file  Social Connections:   . Frequency of Communication with Friends and Family: Not on file  . Frequency of Social Gatherings with Friends and Family: Not on file  . Attends Religious Services: Not on file  . Active Member of Clubs or  Organizations: Not on file  . Attends Archivist Meetings: Not on file  . Marital Status: Not on file  Intimate Partner Violence:   . Fear of Current or Ex-Partner: Not on file  . Emotionally Abused: Not on file  . Physically Abused: Not on file  . Sexually Abused: Not on file    Physical Exam Vitals reviewed.  Constitutional:      Appearance: He is normal weight.  HENT:     Head: Normocephalic.     Nose: Nose normal.     Mouth/Throat:     Mouth: Mucous membranes are moist.     Pharynx: Oropharynx is clear.  Eyes:     Pupils: Pupils are equal, round, and reactive to light.  Cardiovascular:     Rate and Rhythm: Normal rate and regular rhythm.     Pulses: Normal pulses.     Heart sounds: Normal heart sounds.  Pulmonary:     Effort: Pulmonary effort is normal.     Breath sounds: Normal breath sounds.  Abdominal:     General: Abdomen is flat.     Palpations: Abdomen is soft.  Musculoskeletal:        General: Normal range of motion.     Cervical back: Normal range of motion.     Right lower leg: No edema.     Left lower leg: No edema.  Skin:    General: Skin is warm and dry.     Capillary Refill: Capillary refill takes less than 2 seconds.  Neurological:     General: No focal deficit present.     Mental Status: He is alert. Mental status is at baseline.  Psychiatric:        Mood and Affect: Mood normal.    Arrived for home visit for Ella who was alert and oriented smoking a cigarette on my arrival. Cayson reports he has been feeling okay just ongoing back pain. Patient is scheduled for MRI in December. Vitals obtained and are as noted. Medications confirmed and pill box filled accordingly. I reviewed appointments with [name] and confirmed same. Vladimir reports he has continually been having nose bleeds but has not been using saline spray, flonase, or Afrin  and has not used his humidifier as told. I re-informed Mr. Ballon the importance of this and he agreed and I  stated he must use the things prior to getting an appointment with ENT. He agreed. He requested an earlier appointment with HF which I advised we will need to keep current appointment and I will assist until seeing Dr Haroldine Laws. No immediate HF needs. Jadan also requested a dermatology appointment. I will seek out medicaid dermatologist or him. Home visit complete.   Refills:  Eliquis Valtrex Wilder Glade      Future Appointments  Date Time Provider [place]  01/31/2020  3:00 PM [name], [name], MD [facility] None  03/18/2020  3:45 PM Gardiner Barefoot, DPM [facility]     ACTION: Home visit completed Next visit planned for one week

## 2020-01-14 ENCOUNTER — Telehealth: Payer: Self-pay | Admitting: Pharmacist

## 2020-01-14 ENCOUNTER — Other Ambulatory Visit (HOSPITAL_COMMUNITY): Payer: Self-pay

## 2020-01-14 NOTE — Progress Notes (Signed)
Paramedicine Encounter    Patient ID: Russell Collier, male    DOB: 28-May-1960, 59 y.o.   MRN: [id number]   Patient Care Team: [name], MD as PCP - General (Family Medicine) [name], MD as PCP - Cardiology (Cardiology) Michael Boston, MD as Consulting Physician (General Surgery) Wilford Corner, MD as Consulting Physician (Gastroenterology) Jorge Ny, LCSW as Social Worker (Licensed Clinical Social Worker) Alda Berthold, DO as Consulting Physician (Neurology) Jola Baptist, [place] as Referring Physician (Chiropractic Medicine)  Patient Active Problem List   Diagnosis Date Noted  . Coagulation defect ([place]) 12/11/2019  . Fatigue 12/10/2019  . Leg cramps 12/10/2019  . Mild epistaxis 10/30/2019  . Lower extremity edema   . 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 Acute respiratory failure ([place]) 07/27/2019  . Breast tenderness in male 05/31/2019  . Chronic pain 05/31/2019  . AKI (acute kidney injury) ([place]) 05/31/2019  . Pain due to onychomycosis of toenails of both feet 05/16/2019  . Chronic knee pain 04/16/2019  . Diabetes ([place]) 04/16/2019  . Esophageal reflux 02/06/2019  . Heartburn 02/06/2019  . Chronic skin ulcer of lower leg ([place]) 12/21/2018  . S/P drug eluting coronary stent placement   . Coronary artery disease involving native coronary artery of native heart with unstable angina pectoris ([place]) 12/20/2018  . Unstable angina ([place])   . 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 Acute on chronic diastolic heart failure ([place])   . Shortness of breath   . Precordial chest pain   . Idiopathic peripheral neuropathy   . Palpitations   . Chronic diastolic heart failure ([place])   . Abnormal ankle brachial index (ABI)   . Chest pain, rule out acute myocardial infarction 09/26/2018  . Dyspepsia   . Morbid obesity ([place])   . OSA on CPAP   . Chest pain 05/30/2018  . CAD (coronary artery disease) 03/30/2018  . Elevated troponin 02/03/2018  . Positive urine drug screen 02/03/2018   . Tobacco abuse 02/03/2018  . Hyperlipidemia 02/03/2018  . Acute respiratory failure with hypoxia ([place]) 02/02/2018  . Acute congestive heart failure ([place])   . Atypical chest pain 12/27/2015  . Essential hypertension   . Neuropathy     Current Outpatient Medications:  .  Accu-Chek Softclix Lancets lancets, Use as instructed, Disp: 100 each, Rfl: 12 .  acetaminophen (TYLENOL) 650 MG CR tablet, Take 1 tablet (650 mg total) by mouth every 8 (eight) hours as needed for pain., Disp: 270 tablet, Rfl: 0 .  albuterol (PROVENTIL) (2.5 MG/3ML) 0.083% nebulizer solution, Take 2.5 mg by nebulization every 6 (six) hours as needed for wheezing or shortness of breath., Disp: , Rfl:  .  albuterol (VENTOLIN HFA) 108 (90 Base) MCG/ACT inhaler, Inhale 2 puffs into the lungs every 6 (six) hours as needed for wheezing or shortness of breath., Disp: , Rfl:  .  apixaban (ELIQUIS) 5 MG TABS tablet, Take 1 tablet (5 mg total) by mouth 2 (two) times daily., Disp: 60 tablet, Rfl: 11 .  atorvastatin (LIPITOR) 80 MG tablet, Take 1 tablet (80 mg total) by mouth daily., Disp: 90 tablet, Rfl: 3 .  baclofen (LIORESAL) 10 MG tablet, Take 5-10 mg by mouth 3 (three) times daily as needed.  (Patient not taking: Reported on 01/07/2020), Disp: , Rfl:  .  Blood Glucose Monitoring Suppl (ACCU-CHEK GUIDE) w/Device KIT, 1 Units by Does not apply route daily., Disp: 1 kit, Rfl: 0 .  clopidogrel (PLAVIX) 75 MG tablet, Take 1 tablet (75 mg total) by mouth  daily with breakfast., Disp: 90 tablet, Rfl: 3 .  dapagliflozin propanediol (FARXIGA) 10 MG TABS tablet, Take 1 tablet (10 mg total) by mouth daily before breakfast., Disp: 30 tablet, Rfl: 6 .  diazepam (VALIUM) 5 MG tablet, Take 1 by mouth 1 hour  pre-procedure with very light food. May bring 2nd tablet to appointment. (Patient not taking: Reported on 12/17/2019), Disp: 2 tablet, Rfl: 0 .  enalapril (VASOTEC) 20 MG tablet, Take 20 mg by mouth at bedtime., Disp: , Rfl:  .  fluticasone  (FLONASE) 50 MCG/ACT nasal spray, PLACE 1 SPRAY INTO BOTH NOSTRILS DAILY., Disp: 16 g, Rfl: 11 .  furosemide (LASIX) 40 MG tablet, TAKE ONE TABLET BY MOUTH TWICE DAILY, Disp: 180 tablet, Rfl: 3 .  glucose blood (ACCU-CHEK GUIDE) test strip, Use as instructed, Disp: 100 each, Rfl: 12 .  isosorbide mononitrate (IMDUR) 30 MG 24 hr tablet, Take 1 tablet (30 mg total) by mouth daily., Disp: 90 tablet, Rfl: 3 .  lidocaine (LIDODERM) 5 %, Place 1 patch onto the skin daily. Remove & Discard patch within 12 hours or as directed by MD (Patient not taking: Reported on 12/17/2019), Disp: 5 patch, Rfl: 0 .  Multiple Vitamin (MULTIVITAMIN WITH MINERALS) TABS tablet, Take 1 tablet by mouth daily., Disp: , Rfl:  .  nicotine (NICODERM CQ - DOSED IN MG/24 HOURS) 21 mg/24hr patch, Place 1 patch (21 mg total) onto the skin daily. (Patient not taking: Reported on 12/17/2019), Disp: 28 patch, Rfl: 2 .  oxymetazoline (AFRIN NASAL SPRAY) 0.05 % nasal spray, Place 2 sprays into both nostrils daily. Only use when having bleeding. (Patient not taking: Reported on 12/17/2019), Disp: 30 mL, Rfl: 0 .  pantoprazole (PROTONIX) 40 MG tablet, Take 1 tablet (40 mg total) by mouth daily., Disp: 90 tablet, Rfl: 0 .  potassium chloride SA (KLOR-CON) 20 MEQ tablet, TAKE 1 TABLET(20 MEQ) BY MOUTH DAILY (Patient taking differently: Take 20 mEq by mouth daily. TAKE 1 TABLET(20 MEQ) BY MOUTH DAILY), Disp: 90 tablet, Rfl: 3 .  pregabalin (LYRICA) 100 MG capsule, TAKE ONE CAPSULE BY MOUTH THREE TIMES DAILY, Disp: 270 capsule, Rfl: 2 .  umeclidinium-vilanterol (ANORO ELLIPTA) 62.5-25 MCG/INH AEPB, Inhale 1 puff into the lungs daily., Disp: 60 each, Rfl: 2 .  valACYclovir (VALTREX) 500 MG tablet, Take 1 tablet (500 mg total) by mouth daily., Disp: 90 tablet, Rfl: 3 .  VYNDAMAX 61 MG CAPS, TAKE 1 CAPSULE BY MOUTH DAILY., Disp: 30 capsule, Rfl: 11  Current Facility-Administered Medications:  .  methylPREDNISolone acetate (DEPO-MEDROL) injection 40  mg, 40 mg, Intramuscular, Once, [name], MD Allergies  Allergen Reactions  . Varenicline Other (See Comments)    Patient reports laryngospasm stopped in the ER  . Bupropion Other (See Comments)    Headache - moderate/severe - self discontinued agent      Social History   Socioeconomic History  . Marital status: Divorced    Spouse name: Not on file  . Number of children: 2  . Years of education: 10  . Highest education level: Not on file  Occupational History  . Occupation: not employed  Tobacco Use  . Smoking status: Current Every Day Smoker    Packs/day: 0.50    Years: 54.00    Pack years: 27.00    Types: Cigarettes, Cigars  . Smokeless tobacco: Never Used  . Tobacco comment: half pack daily  Vaping Use  . Vaping Use: Never used  Substance and Sexual Activity  . Alcohol use: Yes  Alcohol/week: 4.0 standard drinks    Types: 2 Cans of beer, 2 Shots of liquor per week  . Drug use: Not Currently    Comment: last use may last year  . Sexual activity: Yes  Other Topics Concern  . Not on file  Social History Narrative   Right handed   Two story home   No caffeine   Social Determinants of Health   Financial Resource Strain: Low Risk   . Difficulty of Paying Living Expenses: Not very hard  Food Insecurity: No Food Insecurity  . Worried About Charity fundraiser in the Last Year: Never true  . Ran Out of Food in the Last Year: Never true  Transportation Needs: No Transportation Needs  . Lack of Transportation (Medical): No  . Lack of Transportation (Non-Medical): No  Physical Activity:   . Days of Exercise per Week: Not on file  . Minutes of Exercise per Session: Not on file  Stress:   . Feeling of Stress : Not on file  Social Connections:   . Frequency of Communication with Friends and Family: Not on file  . Frequency of Social Gatherings with Friends and Family: Not on file  . Attends Religious Services: Not on file  . Active Member of Clubs or  Organizations: Not on file  . Attends Archivist Meetings: Not on file  . Marital Status: Not on file  Intimate Partner Violence:   . Fear of Current or Ex-Partner: Not on file  . Emotionally Abused: Not on file  . Physically Abused: Not on file  . Sexually Abused: Not on file    Physical Exam Vitals reviewed.  Constitutional:      Appearance: He is normal weight.  HENT:     Head: Normocephalic.     Nose: Nose normal.     Mouth/Throat:     Mouth: Mucous membranes are moist.     Pharynx: Oropharynx is clear.  Eyes:     Pupils: Pupils are equal, round, and reactive to light.  Cardiovascular:     Rate and Rhythm: Normal rate and regular rhythm.     Pulses: Normal pulses.     Heart sounds: Normal heart sounds.  Pulmonary:     Effort: Pulmonary effort is normal.     Breath sounds: Normal breath sounds.  Abdominal:     General: Abdomen is flat.     Palpations: Abdomen is soft.  Musculoskeletal:        General: No swelling. Normal range of motion.     Cervical back: Normal range of motion.     Right lower leg: No edema.     Left lower leg: No edema.  Skin:    General: Skin is warm and dry.     Capillary Refill: Capillary refill takes less than 2 seconds.  Neurological:     General: No focal deficit present.     Mental Status: He is alert. Mental status is at baseline.  Psychiatric:        Mood and Affect: Mood normal.        Behavior: Behavior normal.        Thought Content: Thought content normal.        Judgment: Judgment normal.      Arrived for home visit Mr. Heldt who was alert and oriented reporting he was feeling okay today aside from increased back and knee pain. Nasiah is scheduled to have MRI done in 2 weeks. Burgess is grateful for same.  Durrel was compliant with most medications over the last week. Medications were reviewed and confirmed. Pill box filled accordingly. Vitals as noted. No swelling, edema. Lung sounds clear. Thane agreed to home visit  in one week. Home visit complete.   REFILLS- NONE   CBG- 100    Future Appointments  Date Time Provider [place]  01/31/2020  3:00 PM [name], [name], MD [facility] None  03/18/2020  3:45 PM Gardiner Barefoot, DPM [facility]     ACTION: Home visit completed Next visit planned for one week

## 2020-01-14 NOTE — Telephone Encounter (Signed)
Contacted patient to follow-up on tobacco reduction efforts.  He was in the midst of his CHF/ Paramedic check-in visit when I called.  He introduced me to his paramedic, Heather.   Patient reports continued interest however he reports no progress in tobacco intake reduction.   He remains interested in follow-up to discuss progress and support for reduction.   I agreed to a 2 week follow-up.

## 2020-01-15 MED FILL — VYNDAMAX 61 MG CAPS: 61 | 30 days supply | Qty: 30 | Fill #1

## 2020-01-21 ENCOUNTER — Other Ambulatory Visit (HOSPITAL_COMMUNITY): Payer: Self-pay

## 2020-01-21 NOTE — Progress Notes (Signed)
Paramedicine Encounter    Patient ID: Russell Collier, male    DOB: 28-May-1960, 59 y.o.   MRN: [id number]   Patient Care Team: [name], MD as PCP - General (Family Medicine) [name], MD as PCP - Cardiology (Cardiology) Michael Boston, MD as Consulting Physician (General Surgery) Wilford Corner, MD as Consulting Physician (Gastroenterology) Jorge Ny, LCSW as Social Worker (Licensed Clinical Social Worker) Alda Berthold, DO as Consulting Physician (Neurology) Jola Baptist, [place] as Referring Physician (Chiropractic Medicine)  Patient Active Problem List   Diagnosis Date Noted  . Coagulation defect ([place]) 12/11/2019  . Fatigue 12/10/2019  . Leg cramps 12/10/2019  . Mild epistaxis 10/30/2019  . Lower extremity edema   . 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 Acute respiratory failure ([place]) 07/27/2019  . Breast tenderness in male 05/31/2019  . Chronic pain 05/31/2019  . AKI (acute kidney injury) ([place]) 05/31/2019  . Pain due to onychomycosis of toenails of both feet 05/16/2019  . Chronic knee pain 04/16/2019  . Diabetes ([place]) 04/16/2019  . Esophageal reflux 02/06/2019  . Heartburn 02/06/2019  . Chronic skin ulcer of lower leg ([place]) 12/21/2018  . S/P drug eluting coronary stent placement   . Coronary artery disease involving native coronary artery of native heart with unstable angina pectoris ([place]) 12/20/2018  . Unstable angina ([place])   . 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 Acute on chronic diastolic heart failure ([place])   . Shortness of breath   . Precordial chest pain   . Idiopathic peripheral neuropathy   . Palpitations   . Chronic diastolic heart failure ([place])   . Abnormal ankle brachial index (ABI)   . Chest pain, rule out acute myocardial infarction 09/26/2018  . Dyspepsia   . Morbid obesity ([place])   . OSA on CPAP   . Chest pain 05/30/2018  . CAD (coronary artery disease) 03/30/2018  . Elevated troponin 02/03/2018  . Positive urine drug screen 02/03/2018   . Tobacco abuse 02/03/2018  . Hyperlipidemia 02/03/2018  . Acute respiratory failure with hypoxia ([place]) 02/02/2018  . Acute congestive heart failure ([place])   . Atypical chest pain 12/27/2015  . Essential hypertension   . Neuropathy     Current Outpatient Medications:  .  Accu-Chek Softclix Lancets lancets, Use as instructed, Disp: 100 each, Rfl: 12 .  acetaminophen (TYLENOL) 650 MG CR tablet, Take 1 tablet (650 mg total) by mouth every 8 (eight) hours as needed for pain., Disp: 270 tablet, Rfl: 0 .  albuterol (PROVENTIL) (2.5 MG/3ML) 0.083% nebulizer solution, Take 2.5 mg by nebulization every 6 (six) hours as needed for wheezing or shortness of breath., Disp: , Rfl:  .  albuterol (VENTOLIN HFA) 108 (90 Base) MCG/ACT inhaler, Inhale 2 puffs into the lungs every 6 (six) hours as needed for wheezing or shortness of breath., Disp: , Rfl:  .  apixaban (ELIQUIS) 5 MG TABS tablet, Take 1 tablet (5 mg total) by mouth 2 (two) times daily., Disp: 60 tablet, Rfl: 11 .  atorvastatin (LIPITOR) 80 MG tablet, Take 1 tablet (80 mg total) by mouth daily., Disp: 90 tablet, Rfl: 3 .  baclofen (LIORESAL) 10 MG tablet, Take 5-10 mg by mouth 3 (three) times daily as needed.  (Patient not taking: Reported on 01/07/2020), Disp: , Rfl:  .  Blood Glucose Monitoring Suppl (ACCU-CHEK GUIDE) w/Device KIT, 1 Units by Does not apply route daily., Disp: 1 kit, Rfl: 0 .  clopidogrel (PLAVIX) 75 MG tablet, Take 1 tablet (75 mg total) by mouth  daily with breakfast., Disp: 90 tablet, Rfl: 3 .  dapagliflozin propanediol (FARXIGA) 10 MG TABS tablet, Take 1 tablet (10 mg total) by mouth daily before breakfast., Disp: 30 tablet, Rfl: 6 .  diazepam (VALIUM) 5 MG tablet, Take 1 by mouth 1 hour  pre-procedure with very light food. May bring 2nd tablet to appointment. (Patient not taking: Reported on 12/17/2019), Disp: 2 tablet, Rfl: 0 .  enalapril (VASOTEC) 20 MG tablet, Take 20 mg by mouth at bedtime., Disp: , Rfl:  .  fluticasone  (FLONASE) 50 MCG/ACT nasal spray, PLACE 1 SPRAY INTO BOTH NOSTRILS DAILY., Disp: 16 g, Rfl: 11 .  furosemide (LASIX) 40 MG tablet, TAKE ONE TABLET BY MOUTH TWICE DAILY, Disp: 180 tablet, Rfl: 3 .  glucose blood (ACCU-CHEK GUIDE) test strip, Use as instructed, Disp: 100 each, Rfl: 12 .  isosorbide mononitrate (IMDUR) 30 MG 24 hr tablet, Take 1 tablet (30 mg total) by mouth daily., Disp: 90 tablet, Rfl: 3 .  lidocaine (LIDODERM) 5 %, Place 1 patch onto the skin daily. Remove & Discard patch within 12 hours or as directed by MD (Patient not taking: Reported on 12/17/2019), Disp: 5 patch, Rfl: 0 .  Multiple Vitamin (MULTIVITAMIN WITH MINERALS) TABS tablet, Take 1 tablet by mouth daily., Disp: , Rfl:  .  nicotine (NICODERM CQ - DOSED IN MG/24 HOURS) 21 mg/24hr patch, Place 1 patch (21 mg total) onto the skin daily. (Patient not taking: Reported on 12/17/2019), Disp: 28 patch, Rfl: 2 .  oxymetazoline (AFRIN NASAL SPRAY) 0.05 % nasal spray, Place 2 sprays into both nostrils daily. Only use when having bleeding., Disp: 30 mL, Rfl: 0 .  pantoprazole (PROTONIX) 40 MG tablet, Take 1 tablet (40 mg total) by mouth daily., Disp: 90 tablet, Rfl: 0 .  potassium chloride SA (KLOR-CON) 20 MEQ tablet, TAKE 1 TABLET(20 MEQ) BY MOUTH DAILY (Patient taking differently: Take 20 mEq by mouth daily. TAKE 1 TABLET(20 MEQ) BY MOUTH DAILY), Disp: 90 tablet, Rfl: 3 .  pregabalin (LYRICA) 100 MG capsule, TAKE ONE CAPSULE BY MOUTH THREE TIMES DAILY, Disp: 270 capsule, Rfl: 2 .  umeclidinium-vilanterol (ANORO ELLIPTA) 62.5-25 MCG/INH AEPB, Inhale 1 puff into the lungs daily., Disp: 60 each, Rfl: 2 .  valACYclovir (VALTREX) 500 MG tablet, Take 1 tablet (500 mg total) by mouth daily., Disp: 90 tablet, Rfl: 3 .  VYNDAMAX 61 MG CAPS, TAKE 1 CAPSULE BY MOUTH DAILY., Disp: 30 capsule, Rfl: 11  Current Facility-Administered Medications:  .  methylPREDNISolone acetate (DEPO-MEDROL) injection 40 mg, 40 mg, Intramuscular, Once, [name], MD Allergies  Allergen Reactions  . Varenicline Other (See Comments)    Patient reports laryngospasm stopped in the ER  . Bupropion Other (See Comments)    Headache - moderate/severe - self discontinued agent      Social History   Socioeconomic History  . Marital status: Divorced    Spouse name: Not on file  . Number of children: 2  . Years of education: 10  . Highest education level: Not on file  Occupational History  . Occupation: not employed  Tobacco Use  . Smoking status: Current Every Day Smoker    Packs/day: 0.50    Years: 54.00    Pack years: 27.00    Types: Cigarettes, Cigars  . Smokeless tobacco: Never Used  . Tobacco comment: half pack daily  Vaping Use  . Vaping Use: Never used  Substance and Sexual Activity  . Alcohol use: Yes    Alcohol/week: 4.0 standard drinks  Types: 2 Cans of beer, 2 Shots of liquor per week  . Drug use: Not Currently    Comment: last use may last year  . Sexual activity: Yes  Other Topics Concern  . Not on file  Social History Narrative   Right handed   Two story home   No caffeine   Social Determinants of Health   Financial Resource Strain: Low Risk   . Difficulty of Paying Living Expenses: Not very hard  Food Insecurity: No Food Insecurity  . Worried About Charity fundraiser in the Last Year: Never true  . Ran Out of Food in the Last Year: Never true  Transportation Needs: No Transportation Needs  . Lack of Transportation (Medical): No  . Lack of Transportation (Non-Medical): No  Physical Activity:   . Days of Exercise per Week: Not on file  . Minutes of Exercise per Session: Not on file  Stress:   . Feeling of Stress : Not on file  Social Connections:   . Frequency of Communication with Friends and Family: Not on file  . Frequency of Social Gatherings with Friends and Family: Not on file  . Attends Religious Services: Not on file  . Active Member of Clubs or Organizations: Not on file  . Attends Theatre manager Meetings: Not on file  . Marital Status: Not on file  Intimate Partner Violence:   . Fear of Current or Ex-Partner: Not on file  . Emotionally Abused: Not on file  . Physically Abused: Not on file  . Sexually Abused: Not on file    Physical Exam Vitals reviewed.  HENT:     Head: Normocephalic.     Nose: Nose normal.     Mouth/Throat:     Mouth: Mucous membranes are moist.     Pharynx: Oropharynx is clear.  Eyes:     Pupils: Pupils are equal, round, and reactive to light.  Cardiovascular:     Rate and Rhythm: Normal rate and regular rhythm.     Pulses: Normal pulses.     Heart sounds: Normal heart sounds.  Pulmonary:     Effort: Pulmonary effort is normal.     Breath sounds: Wheezing present.  Abdominal:     General: There is distension.     Palpations: Abdomen is soft.  Musculoskeletal:        General: Swelling and tenderness present. Normal range of motion.     Cervical back: Normal range of motion.     Right lower leg: Edema present.     Left lower leg: Edema present.  Skin:    General: Skin is warm and dry.     Capillary Refill: Capillary refill takes less than 2 seconds.  Neurological:     General: No focal deficit present.     Mental Status: He is alert. Mental status is at baseline.  Psychiatric:        Mood and Affect: Mood normal.     Arrived for home visit for Rowdy who was alert and oriented reporting he is having left leg pain that increases upon walking and on palpation. Pain is located lower tib/fib area where he had an open wound a few years ago. Leighton reports the pain hurts down in the bone. Kayron says he takes Tylenol for pain. I reviewed medications and confirmed same. Pill box filled accordingly. Ysabel's lung sounds had some wheezing in the lower left lobe, Eames had not used his inhaler today. Mozes denied any recent nose  bleeds. Vitals as noted. Weight up 6lbs from last week, Thijs reports he ate a lot of salty foods over the  holiday. I advised him to be sure to avoid high sodium foods in his diet this week and increase his exercise if he can. He agreed. Home visit complete. I will see Avi in one week.   Refills:  Pantopraozle     Future Appointments  Date Time Provider [place]  01/31/2020  3:00 PM [name], [name], MD [facility] None  03/18/2020  3:45 PM Gardiner Barefoot, DPM [facility]     ACTION: Home visit completed Next visit planned for one week

## 2020-01-24 ENCOUNTER — Other Ambulatory Visit: Payer: Self-pay | Admitting: Family Medicine

## 2020-01-27 ENCOUNTER — Telehealth: Payer: Self-pay | Admitting: Physician Assistant

## 2020-01-27 NOTE — Telephone Encounter (Signed)
   Russell Collier has been having problems with nosebleeds intermittently.  He has always managed to get them stopped in a reasonable amount of time.  He would use a nasal plug and then use some Afrin once the bleeding stopped.  He has been using the Afrin at least twice a day.  His nose started bleeding today.  It has been bleeding for more than 2 hours which is unusual.  It is also bleeding more heavily than usual.  He has tried using tissues to plug it up, but those are unsuccessful.  He says he is got big long clots coming out.  Advised him that he would need to be seen for this, it sounds like he needs nasal packing or other intervention to get the bleeding to stop.  He has a history of PE, so I do not wish to stop the Eliquis.  He had a stent in October 2020, so has been on the Plavix just over a year.  Dr. Clayborne Dana last visit, he continued it so continue the Plavix as well for now.  Russell Collier has an appointment with Dr. Haroldine Laws coming up ninth, he is encouraged to keep this.  Of note, his weight has been stable and his blood pressure has been fairly well controlled.  Rosaria Ferries, PA-C 01/27/2020 12:51 PM

## 2020-01-28 ENCOUNTER — Other Ambulatory Visit (HOSPITAL_COMMUNITY): Payer: Self-pay

## 2020-01-28 NOTE — Progress Notes (Signed)
Paramedicine Encounter    Patient ID: Russell Collier, male    DOB: [birth year], 59 y.o.   MRN: [id number]   Patient Care Team: [name], MD as PCP - General (Family Medicine) [name], MD as PCP - Cardiology (Cardiology) Michael Boston, MD as Consulting Physician (General Surgery) Wilford Corner, MD as Consulting Physician (Gastroenterology) Jorge Ny, LCSW as Social Worker (Licensed Clinical Social Worker) Alda Berthold, DO as Consulting Physician (Neurology) Jola Baptist, [place] as Referring Physician (Chiropractic Medicine)  Patient Active Problem List   Diagnosis Date Noted  . Coagulation defect ([place]) 12/11/2019  . Fatigue 12/10/2019  . Leg cramps 12/10/2019  . Mild epistaxis 10/30/2019  . Lower extremity edema   . 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 Acute respiratory failure ([place]) 07/27/2019  . Breast tenderness in male 05/31/2019  . Chronic pain 05/31/2019  . AKI (acute kidney injury) ([place]) 05/31/2019  . Pain due to onychomycosis of toenails of both feet 05/16/2019  . Chronic knee pain 04/16/2019  . Diabetes ([place]) 04/16/2019  . Esophageal reflux 02/06/2019  . Heartburn 02/06/2019  . Chronic skin ulcer of lower leg ([place]) 12/21/2018  . S/P drug eluting coronary stent placement   . Coronary artery disease involving native coronary artery of native heart with unstable angina pectoris ([place]) 12/20/2018  . Unstable angina ([place])   . 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 Acute on chronic diastolic heart failure ([place])   . Shortness of breath   . Precordial chest pain   . Idiopathic peripheral neuropathy   . Palpitations   . Chronic diastolic heart failure ([place])   . Abnormal ankle brachial index (ABI)   . Chest pain, rule out acute myocardial infarction 09/26/2018  . Dyspepsia   . Morbid obesity ([place])   . OSA on CPAP   . Chest pain 05/30/2018  . CAD (coronary artery disease) 03/30/2018  . Elevated troponin 02/03/2018  . Positive urine drug screen 02/03/2018   . Tobacco abuse 02/03/2018  . Hyperlipidemia 02/03/2018  . Acute respiratory failure with hypoxia ([place]) 02/02/2018  . Acute congestive heart failure ([place])   . Atypical chest pain 12/27/2015  . Essential hypertension   . Neuropathy     Current Outpatient Medications:  .  Accu-Chek Softclix Lancets lancets, Use as instructed, Disp: 100 each, Rfl: 12 .  albuterol (PROVENTIL) (2.5 MG/3ML) 0.083% nebulizer solution, Take 2.5 mg by nebulization every 6 (six) hours as needed for wheezing or shortness of breath., Disp: , Rfl:  .  albuterol (VENTOLIN HFA) 108 (90 Base) MCG/ACT inhaler, Inhale 2 puffs into the lungs every 6 (six) hours as needed for wheezing or shortness of breath., Disp: , Rfl:  .  apixaban (ELIQUIS) 5 MG TABS tablet, Take 1 tablet (5 mg total) by mouth 2 (two) times daily., Disp: 60 tablet, Rfl: 11 .  atorvastatin (LIPITOR) 80 MG tablet, Take 1 tablet (80 mg total) by mouth daily., Disp: 90 tablet, Rfl: 3 .  baclofen (LIORESAL) 10 MG tablet, Take 5-10 mg by mouth 3 (three) times daily as needed.  (Patient not taking: Reported on 01/07/2020), Disp: , Rfl:  .  Blood Glucose Monitoring Suppl (ACCU-CHEK GUIDE) w/Device KIT, 1 Units by Does not apply route daily., Disp: 1 kit, Rfl: 0 .  clopidogrel (PLAVIX) 75 MG tablet, Take 1 tablet (75 mg total) by mouth daily with breakfast., Disp: 90 tablet, Rfl: 3 .  dapagliflozin propanediol (FARXIGA) 10 MG TABS tablet, Take 1 tablet (10 mg total) by mouth daily before breakfast., Disp:  30 tablet, Rfl: 6 .  diazepam (VALIUM) 5 MG tablet, Take 1 by mouth 1 hour  pre-procedure with very light food. May bring 2nd tablet to appointment. (Patient not taking: Reported on 12/17/2019), Disp: 2 tablet, Rfl: 0 .  enalapril (VASOTEC) 20 MG tablet, Take 20 mg by mouth at bedtime., Disp: , Rfl:  .  fluticasone (FLONASE) 50 MCG/ACT nasal spray, PLACE 1 SPRAY INTO BOTH NOSTRILS DAILY., Disp: 16 g, Rfl: 11 .  furosemide (LASIX) 40 MG tablet, TAKE ONE TABLET BY  MOUTH TWICE DAILY, Disp: 180 tablet, Rfl: 3 .  glucose blood (ACCU-CHEK GUIDE) test strip, Use as instructed, Disp: 100 each, Rfl: 12 .  isosorbide mononitrate (IMDUR) 30 MG 24 hr tablet, Take 1 tablet (30 mg total) by mouth daily., Disp: 90 tablet, Rfl: 3 .  lidocaine (LIDODERM) 5 %, Place 1 patch onto the skin daily. Remove & Discard patch within 12 hours or as directed by MD (Patient not taking: Reported on 12/17/2019), Disp: 5 patch, Rfl: 0 .  Multiple Vitamin (MULTIVITAMIN WITH MINERALS) TABS tablet, Take 1 tablet by mouth daily., Disp: , Rfl:  .  nicotine (NICODERM CQ - DOSED IN MG/24 HOURS) 21 mg/24hr patch, Place 1 patch (21 mg total) onto the skin daily. (Patient not taking: Reported on 12/17/2019), Disp: 28 patch, Rfl: 2 .  oxymetazoline (AFRIN NASAL SPRAY) 0.05 % nasal spray, Place 2 sprays into both nostrils daily. Only use when having bleeding., Disp: 30 mL, Rfl: 0 .  pantoprazole (PROTONIX) 40 MG tablet, TAKE 1 TABLET (40 MG TOTAL) BY MOUTH DAILY., Disp: 90 tablet, Rfl: 0 .  potassium chloride SA (KLOR-CON) 20 MEQ tablet, TAKE 1 TABLET(20 MEQ) BY MOUTH DAILY (Patient taking differently: Take 20 mEq by mouth daily. TAKE 1 TABLET(20 MEQ) BY MOUTH DAILY), Disp: 90 tablet, Rfl: 3 .  pregabalin (LYRICA) 100 MG capsule, TAKE ONE CAPSULE BY MOUTH THREE TIMES DAILY, Disp: 270 capsule, Rfl: 2 .  umeclidinium-vilanterol (ANORO ELLIPTA) 62.5-25 MCG/INH AEPB, Inhale 1 puff into the lungs daily., Disp: 60 each, Rfl: 2 .  valACYclovir (VALTREX) 500 MG tablet, Take 1 tablet (500 mg total) by mouth daily., Disp: 90 tablet, Rfl: 3 .  VYNDAMAX 61 MG CAPS, TAKE 1 CAPSULE BY MOUTH DAILY., Disp: 30 capsule, Rfl: 11  Current Facility-Administered Medications:  .  methylPREDNISolone acetate (DEPO-MEDROL) injection 40 mg, 40 mg, Intramuscular, Once, [name], MD Allergies  Allergen Reactions  . Varenicline Other (See Comments)    Patient reports laryngospasm stopped in the ER  . Bupropion Other  (See Comments)    Headache - moderate/severe - self discontinued agent      Social History   Socioeconomic History  . Marital status: Divorced    Spouse name: Not on file  . Number of children: 2  . Years of education: 10  . Highest education level: Not on file  Occupational History  . Occupation: not employed  Tobacco Use  . Smoking status: Current Every Day Smoker    Packs/day: 0.50    Years: 54.00    Pack years: 27.00    Types: Cigarettes, Cigars  . Smokeless tobacco: Never Used  . Tobacco comment: half pack daily  Vaping Use  . Vaping Use: Never used  Substance and Sexual Activity  . Alcohol use: Yes    Alcohol/week: 4.0 standard drinks    Types: 2 Cans of beer, 2 Shots of liquor per week  . Drug use: Not Currently    Comment: last use may last year  .  Sexual activity: Yes  Other Topics Concern  . Not on file  Social History Narrative   Right handed   Two story home   No caffeine   Social Determinants of Health   Financial Resource Strain: Low Risk   . Difficulty of Paying Living Expenses: Not very hard  Food Insecurity: No Food Insecurity  . Worried About Charity fundraiser in the Last Year: Never true  . Ran Out of Food in the Last Year: Never true  Transportation Needs: No Transportation Needs  . Lack of Transportation (Medical): No  . Lack of Transportation (Non-Medical): No  Physical Activity:   . Days of Exercise per Week: Not on file  . Minutes of Exercise per Session: Not on file  Stress:   . Feeling of Stress : Not on file  Social Connections:   . Frequency of Communication with Friends and Family: Not on file  . Frequency of Social Gatherings with Friends and Family: Not on file  . Attends Religious Services: Not on file  . Active Member of Clubs or Organizations: Not on file  . Attends Archivist Meetings: Not on file  . Marital Status: Not on file  Intimate Partner Violence:   . Fear of Current or Ex-Partner: Not on file  .  Emotionally Abused: Not on file  . Physically Abused: Not on file  . Sexually Abused: Not on file    Physical Exam Vitals reviewed.  Constitutional:      Appearance: He is normal weight.  HENT:     Head: Normocephalic.     Nose: Nose normal.     Mouth/Throat:     Mouth: Mucous membranes are moist.     Pharynx: Oropharynx is clear.  Eyes:     Pupils: Pupils are equal, round, and reactive to light.  Cardiovascular:     Rate and Rhythm: Normal rate and regular rhythm.     Pulses: Normal pulses.     Heart sounds: Normal heart sounds.  Pulmonary:     Effort: Pulmonary effort is normal.     Breath sounds: Normal breath sounds.  Abdominal:     Palpations: Abdomen is soft.  Musculoskeletal:        General: Tenderness present. Normal range of motion.     Cervical back: Normal range of motion.     Right lower leg: No edema.     Left lower leg: No edema.     Comments: Left lower leg tenderness   Skin:    General: Skin is warm and dry.     Capillary Refill: Capillary refill takes less than 2 seconds.  Neurological:     General: No focal deficit present.     Mental Status: He is alert. Mental status is at baseline.  Psychiatric:        Mood and Affect: Mood normal.     Arrived for home visit for Horton who was alert and oriented who reports he was feeling okay other than back pain and feeling weak. Khayden was compliant with medications except for his bedtime dose of his Lyrica. Medications were reviewed and confirmed. Pill box filled accordingly. Vitals obtained and are as noted. Kainon reports having a two hour nose bleed yesterday and he was able to finally get it controlled. Fender is requesting a follow up with his PCP about the frequent nose bleeds. I will continue to assist. I will accompany Dajaun for his visit on Thursday with Dr. Haroldine Laws. Home visit complete.  Refills: NONE     Future Appointments  Date Time Provider [place]  01/31/2020  3:00 PM [name],  [name], MD [facility] None  03/18/2020  3:45 PM Gardiner Barefoot, DPM [facility]     ACTION: Home visit completed Next visit planned for one week

## 2020-01-31 ENCOUNTER — Other Ambulatory Visit: Payer: Self-pay

## 2020-01-31 ENCOUNTER — Other Ambulatory Visit (HOSPITAL_COMMUNITY): Payer: Self-pay

## 2020-01-31 ENCOUNTER — Encounter (HOSPITAL_COMMUNITY): Payer: Self-pay | Admitting: Internal Medicine

## 2020-01-31 ENCOUNTER — Ambulatory Visit (HOSPITAL_COMMUNITY)
Admission: RE | Admit: 2020-01-31 | Discharge: 2020-01-31 | Disposition: A | Discharge status: Home / Self Care | Payer: Medicaid Other | Source: Ambulatory Visit | Attending: Internal Medicine | Admitting: Internal Medicine

## 2020-01-31 ENCOUNTER — Telehealth: Payer: Self-pay | Admitting: Pharmacist

## 2020-01-31 VITALS — BP 110/70 | HR 81 | Wt 307.0 lb

## 2020-01-31 DIAGNOSIS — Z7902 Long term (current) use of antithrombotics/antiplatelets: Secondary | ICD-10-CM | POA: Insufficient documentation

## 2020-01-31 DIAGNOSIS — Z6841 Body Mass Index (BMI) 40.0 and over, adult: Secondary | ICD-10-CM | POA: Diagnosis not present

## 2020-01-31 DIAGNOSIS — Z79899 Other long term (current) drug therapy: Secondary | ICD-10-CM | POA: Insufficient documentation

## 2020-01-31 DIAGNOSIS — Z833 Family history of diabetes mellitus: Secondary | ICD-10-CM | POA: Insufficient documentation

## 2020-01-31 DIAGNOSIS — N62 Hypertrophy of breast: Secondary | ICD-10-CM | POA: Insufficient documentation

## 2020-01-31 DIAGNOSIS — I43 Cardiomyopathy in diseases classified elsewhere: Secondary | ICD-10-CM | POA: Diagnosis not present

## 2020-01-31 DIAGNOSIS — Z86718 Personal history of other venous thrombosis and embolism: Secondary | ICD-10-CM | POA: Diagnosis not present

## 2020-01-31 DIAGNOSIS — Z7984 Long term (current) use of oral hypoglycemic drugs: Secondary | ICD-10-CM | POA: Insufficient documentation

## 2020-01-31 DIAGNOSIS — R040 Epistaxis: Secondary | ICD-10-CM | POA: Diagnosis not present

## 2020-01-31 DIAGNOSIS — G629 Polyneuropathy, unspecified: Secondary | ICD-10-CM | POA: Insufficient documentation

## 2020-01-31 DIAGNOSIS — M25569 Pain in unspecified knee: Secondary | ICD-10-CM | POA: Insufficient documentation

## 2020-01-31 DIAGNOSIS — Z955 Presence of coronary angioplasty implant and graft: Secondary | ICD-10-CM | POA: Insufficient documentation

## 2020-01-31 DIAGNOSIS — I2510 Atherosclerotic heart disease of native coronary artery without angina pectoris: Secondary | ICD-10-CM | POA: Insufficient documentation

## 2020-01-31 DIAGNOSIS — Z8249 Family history of ischemic heart disease and other diseases of the circulatory system: Secondary | ICD-10-CM | POA: Diagnosis not present

## 2020-01-31 DIAGNOSIS — Z720 Tobacco use: Secondary | ICD-10-CM

## 2020-01-31 DIAGNOSIS — Z7901 Long term (current) use of anticoagulants: Secondary | ICD-10-CM | POA: Diagnosis not present

## 2020-01-31 DIAGNOSIS — I110 Hypertensive heart disease with heart failure: Secondary | ICD-10-CM | POA: Insufficient documentation

## 2020-01-31 DIAGNOSIS — G4733 Obstructive sleep apnea (adult) (pediatric): Secondary | ICD-10-CM | POA: Insufficient documentation

## 2020-01-31 DIAGNOSIS — E854 Organ-limited amyloidosis: Secondary | ICD-10-CM | POA: Insufficient documentation

## 2020-01-31 DIAGNOSIS — R002 Palpitations: Secondary | ICD-10-CM | POA: Insufficient documentation

## 2020-01-31 DIAGNOSIS — I959 Hypotension, unspecified: Secondary | ICD-10-CM | POA: Insufficient documentation

## 2020-01-31 DIAGNOSIS — I5032 Chronic diastolic (congestive) heart failure: Secondary | ICD-10-CM | POA: Diagnosis not present

## 2020-01-31 DIAGNOSIS — Z713 Dietary counseling and surveillance: Secondary | ICD-10-CM | POA: Insufficient documentation

## 2020-01-31 DIAGNOSIS — M545 Low back pain, unspecified: Secondary | ICD-10-CM | POA: Insufficient documentation

## 2020-01-31 DIAGNOSIS — Z86711 Personal history of pulmonary embolism: Secondary | ICD-10-CM | POA: Insufficient documentation

## 2020-01-31 DIAGNOSIS — F17210 Nicotine dependence, cigarettes, uncomplicated: Secondary | ICD-10-CM | POA: Insufficient documentation

## 2020-01-31 DIAGNOSIS — R0602 Shortness of breath: Secondary | ICD-10-CM | POA: Insufficient documentation

## 2020-01-31 MED ORDER — APIXABAN 2.5 MG PO TABS: 2.5000 mg | ORAL_TABLET | Freq: Two times a day (BID) | ORAL | 11 refills | Status: DC
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
## 2020-01-31 NOTE — Telephone Encounter (Signed)
Noted and agree. 

## 2020-01-31 NOTE — Addendum Note (Signed)
Encounter addended by: Scarlette Calico, RN on: 01/31/2020 4:17 PM  Actions taken: Pharmacy for encounter modified, Order list changed, Clinical Note Signed

## 2020-01-31 NOTE — Telephone Encounter (Signed)
Contacted patient to discuss progress with tobacco intake reduction.   Patient continues to smoke ~ 1/2 ppd at this time.    He verbalizes continued interest in trying to quick cigarettes.  He self-verbalized goal of smoking 6 or less as his short-term goal.    We agreed that early January was a reasonable target for next contact (phone call).  He is planning PCP visit with Dr. Caron Presume in near future.

## 2020-01-31 NOTE — Progress Notes (Signed)
Paramedicine Encounter  Arrived for clinic visit with Bryndon who was complaining of lower back pain today which is chronic for him. Caellum denied any recent issues with shortness of breath or dizziness, chest pain or trouble sleeping. Jakin did report frequent nose bleeds to Dr. Haroldine Laws who discontinued Plavix and reduced Eliquis to 2.5mg  BID. No other medication changes made. Dr. Haroldine Laws agreed with continuing with bi-weekly visits. I verified medications and filled pill box with appropriate medication changes. Dejon understood to reach out reference nose bleeds and we will schedule appointment with ENT as Dr. Haroldine Laws sent in referral to [facility] today. I will see Narvel in two weeks. Clinic visit complete.   Refills: (NEXT WEEK) -Farxiga -Valtrex   ACTION: Home visit completed Next visit planned for two weeks on monday dec. 20th.

## 2020-01-31 NOTE — Patient Instructions (Signed)
Stop Plavix  Decrease Eliquis to 2.5 mg Twice daily   IF nose bleeds persist please discuss with your Primary Care MD  Please call our office in July 2022 to schedule your follow up appointment  If you have any questions or concerns before your next appointment please send [place] a message through [place] or call our office at [phone].    TO LEAVE A MESSAGE FOR THE NURSE SELECT OPTION 2, PLEASE LEAVE A MESSAGE INCLUDING: . YOUR NAME . DATE OF BIRTH . CALL BACK NUMBER . REASON FOR CALL**this is important as we prioritize the call backs  [place] AS LONG AS YOU CALL BEFORE 4:00 PM  At the [facility], you and your health needs are our priority. As part of our continuing mission to provide you with exceptional heart care, we have created designated Provider Care Teams. These Care Teams include your primary Cardiologist (physician) and Advanced Practice Providers (APPs- Physician Assistants and Nurse Practitioners) who all work together to provide you with the care you need, when you need it.   You may see any of the following providers on your designated Care Team at your next follow up: Marland Kitchen Dr Glori Bickers . Dr Loralie Champagne . Darrick Grinder, NP . Lyda Jester, PA . Audry Riles, PharmD   Please be sure to bring in all your medications bottles to every appointment.

## 2020-02-11 ENCOUNTER — Other Ambulatory Visit (HOSPITAL_COMMUNITY): Payer: Self-pay

## 2020-02-11 MED FILL — VYNDAMAX 61 MG CAPS: 61 | 30 days supply | Qty: 30 | Fill #2

## 2020-02-11 NOTE — Progress Notes (Signed)
Paramedicine Encounter    Patient ID: Russell Collier, male    DOB: 1960-11-08, 59 y.o.   MRN: [id number]   Patient Care Team: [name], MD as PCP - General (Family Medicine) [name], MD as PCP - Cardiology (Cardiology) Michael Boston, MD as Consulting Physician (General Surgery) Wilford Corner, MD as Consulting Physician (Gastroenterology) Jorge Ny, LCSW as Social Worker (Licensed Clinical Social Worker) Alda Berthold, DO as Consulting Physician (Neurology) Jola Baptist, [place] as Referring Physician (Chiropractic Medicine)  Patient Active Problem List   Diagnosis Date Noted  . Coagulation defect ([name]) 12/11/2019  . Fatigue 12/10/2019  . Leg cramps 12/10/2019  . Mild epistaxis 10/30/2019  . Lower extremity edema   . 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 Acute respiratory failure ([place]) 07/27/2019  . Breast tenderness in male 05/31/2019  . Chronic pain 05/31/2019  . AKI (acute kidney injury) ([place]) 05/31/2019  . Pain due to onychomycosis of toenails of both feet 05/16/2019  . Chronic knee pain 04/16/2019  . Diabetes ([place]) 04/16/2019  . Esophageal reflux 02/06/2019  . Heartburn 02/06/2019  . Chronic skin ulcer of lower leg ([place]) 12/21/2018  . S/P drug eluting coronary stent placement   . Coronary artery disease involving native coronary artery of native heart with unstable angina pectoris ([place]) 12/20/2018  . Unstable angina ([place])   . 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 Acute on chronic diastolic heart failure ([place])   . Shortness of breath   . Precordial chest pain   . Idiopathic peripheral neuropathy   . Palpitations   . Chronic diastolic heart failure ([place])   . Abnormal ankle brachial index (ABI)   . Chest pain, rule out acute myocardial infarction 09/26/2018  . Dyspepsia   . Morbid obesity ([place])   . OSA on CPAP   . Chest pain 05/30/2018  . CAD (coronary artery disease) 03/30/2018  . Elevated troponin 02/03/2018  . Positive urine drug screen 02/03/2018   . Tobacco abuse 02/03/2018  . Hyperlipidemia 02/03/2018  . Acute respiratory failure with hypoxia ([place]) 02/02/2018  . Acute congestive heart failure ([place])   . Atypical chest pain 12/27/2015  . Essential hypertension   . Neuropathy     Current Outpatient Medications:  .  Accu-Chek Softclix Lancets lancets, Use as instructed, Disp: 100 each, Rfl: 12 .  albuterol (PROVENTIL) (2.5 MG/3ML) 0.083% nebulizer solution, Take 2.5 mg by nebulization every 6 (six) hours as needed for wheezing or shortness of breath., Disp: , Rfl:  .  albuterol (VENTOLIN HFA) 108 (90 Base) MCG/ACT inhaler, Inhale 2 puffs into the lungs every 6 (six) hours as needed for wheezing or shortness of breath., Disp: , Rfl:  .  apixaban (ELIQUIS) 2.5 MG TABS tablet, Take 1 tablet (2.5 mg total) by mouth 2 (two) times daily., Disp: 60 tablet, Rfl: 11 .  atorvastatin (LIPITOR) 80 MG tablet, Take 1 tablet (80 mg total) by mouth daily., Disp: 90 tablet, Rfl: 3 .  Blood Glucose Monitoring Suppl (ACCU-CHEK GUIDE) w/Device KIT, 1 Units by Does not apply route daily., Disp: 1 kit, Rfl: 0 .  dapagliflozin propanediol (FARXIGA) 10 MG TABS tablet, Take 1 tablet (10 mg total) by mouth daily before breakfast., Disp: 30 tablet, Rfl: 6 .  enalapril (VASOTEC) 20 MG tablet, Take 20 mg by mouth at bedtime., Disp: , Rfl:  .  fluticasone (FLONASE) 50 MCG/ACT nasal spray, PLACE 1 SPRAY INTO BOTH NOSTRILS DAILY., Disp: 16 g, Rfl: 11 .  furosemide (LASIX) 40 MG tablet, TAKE ONE TABLET  BY MOUTH TWICE DAILY, Disp: 180 tablet, Rfl: 3 .  glucose blood (ACCU-CHEK GUIDE) test strip, Use as instructed, Disp: 100 each, Rfl: 12 .  isosorbide mononitrate (IMDUR) 30 MG 24 hr tablet, Take 1 tablet (30 mg total) by mouth daily., Disp: 90 tablet, Rfl: 3 .  Multiple Vitamin (MULTIVITAMIN WITH MINERALS) TABS tablet, Take 1 tablet by mouth daily., Disp: , Rfl:  .  pantoprazole (PROTONIX) 40 MG tablet, TAKE 1 TABLET (40 MG TOTAL) BY MOUTH DAILY., Disp: 90 tablet, Rfl:  0 .  potassium chloride SA (KLOR-CON) 20 MEQ tablet, TAKE 1 TABLET(20 MEQ) BY MOUTH DAILY, Disp: 90 tablet, Rfl: 3 .  pregabalin (LYRICA) 100 MG capsule, TAKE ONE CAPSULE BY MOUTH THREE TIMES DAILY, Disp: 270 capsule, Rfl: 2 .  umeclidinium-vilanterol (ANORO ELLIPTA) 62.5-25 MCG/INH AEPB, Inhale 1 puff into the lungs daily as needed., Disp: , Rfl:  .  valACYclovir (VALTREX) 500 MG tablet, Take 1 tablet (500 mg total) by mouth daily., Disp: 90 tablet, Rfl: 3 .  VYNDAMAX 61 MG CAPS, TAKE 1 CAPSULE BY MOUTH DAILY., Disp: 30 capsule, Rfl: 11  Current Facility-Administered Medications:  .  methylPREDNISolone acetate (DEPO-MEDROL) injection 40 mg, 40 mg, Intramuscular, Once, [name], MD Allergies  Allergen Reactions  . Varenicline Other (See Comments)    Patient reports laryngospasm stopped in the ER  . Bupropion Other (See Comments)    Headache - moderate/severe - self discontinued agent      Social History   Socioeconomic History  . Marital status: Divorced    Spouse name: Not on file  . Number of children: 2  . Years of education: 10  . Highest education level: Not on file  Occupational History  . Occupation: not employed  Tobacco Use  . Smoking status: Current Every Day Smoker    Packs/day: 0.50    Years: 54.00    Pack years: 27.00    Types: Cigarettes, Cigars  . Smokeless tobacco: Never Used  . Tobacco comment: half pack daily  Vaping Use  . Vaping Use: Never used  Substance and Sexual Activity  . Alcohol use: Yes    Alcohol/week: 4.0 standard drinks    Types: 2 Cans of beer, 2 Shots of liquor per week  . Drug use: Not Currently    Comment: last use may last year  . Sexual activity: Yes  Other Topics Concern  . Not on file  Social History Narrative   Right handed   Two story home   No caffeine   Social Determinants of Health   Financial Resource Strain: Low Risk   . Difficulty of Paying Living Expenses: Not very hard  Food Insecurity: No Food Insecurity   . Worried About Charity fundraiser in the Last Year: Never true  . Ran Out of Food in the Last Year: Never true  Transportation Needs: No Transportation Needs  . Lack of Transportation (Medical): No  . Lack of Transportation (Non-Medical): No  Physical Activity: Not on file  Stress: Not on file  Social Connections: Not on file  Intimate Partner Violence: Not on file    Physical Exam Vitals reviewed.  Constitutional:      Appearance: He is normal weight.  HENT:     Head: Normocephalic.     Nose: Nose normal.     Mouth/Throat:     Mouth: Mucous membranes are moist.     Pharynx: Oropharynx is clear.  Eyes:     Pupils: Pupils are equal, round, and reactive  to light.  Cardiovascular:     Rate and Rhythm: Normal rate and regular rhythm.     Pulses: Normal pulses.     Heart sounds: Normal heart sounds.  Pulmonary:     Effort: Pulmonary effort is normal. No respiratory distress.     Breath sounds: Normal breath sounds.  Abdominal:     General: Abdomen is flat.     Palpations: Abdomen is soft.  Musculoskeletal:        General: Normal range of motion.     Cervical back: Normal range of motion.     Right lower leg: No edema.     Left lower leg: No edema.  Skin:    General: Skin is warm and dry.     Capillary Refill: Capillary refill takes less than 2 seconds.  Neurological:     General: No focal deficit present.     Mental Status: He is alert. Mental status is at baseline.  Psychiatric:        Mood and Affect: Mood normal.     Arrived for home visit for Russell Collier who was alert and oriented complaining of his chronic back pain. Russell Collier is scheduled for MRI and surgery consult within the next two weeks. Vitals obtained. Russell Collier down 9lbs since last visit two weeks ago. Current weight 298.2lbs. I reviewed medications and filled two pill boxes accordingly. Russell Collier and I reviewed appointment list. NO edema noted. Denied shortness of breath, or chest pain. Also, denied nose bleeds over  the last two weeks. Home visit complete. I will see Russell Collier in two weeks.  Refills: Farxiga Valtrex Atorvastatin Pantoprazole Enalipril     Future Appointments  Date Time Provider [place]  02/13/2020 12:15 PM [facility] [place] [facility]  03/18/2020  3:45 PM Gardiner Barefoot, DPM [facility]  03/20/2020  1:30 PM Laurin Coder, MD [facility] None     ACTION: Home visit completed Next visit planned for two weeks

## 2020-02-13 ENCOUNTER — Ambulatory Visit: Payer: Medicaid Other

## 2020-02-14 DIAGNOSIS — R52 Pain, unspecified: Secondary | ICD-10-CM | POA: Diagnosis not present

## 2020-02-14 DIAGNOSIS — M545 Low back pain, unspecified: Secondary | ICD-10-CM | POA: Diagnosis not present

## 2020-02-18 DIAGNOSIS — Z720 Tobacco use: Secondary | ICD-10-CM | POA: Diagnosis not present

## 2020-02-18 DIAGNOSIS — M5416 Radiculopathy, lumbar region: Secondary | ICD-10-CM | POA: Diagnosis not present

## 2020-02-18 DIAGNOSIS — M48061 Spinal stenosis, lumbar region without neurogenic claudication: Secondary | ICD-10-CM | POA: Diagnosis not present

## 2020-02-19 ENCOUNTER — Ambulatory Visit: Payer: Medicaid Other | Attending: Critical Care Medicine

## 2020-02-19 DIAGNOSIS — Z23 Encounter for immunization: Secondary | ICD-10-CM

## 2020-02-19 NOTE — Progress Notes (Signed)
   Covid-19 Vaccination Clinic  Name:  Duwayne Matters    MRN: [id number] DOB: 11/22/1960  02/19/2020  Mr. Gumina was observed post Covid-19 immunization for 15 minutes without incident. He was provided with Vaccine Information Sheet and instruction to access the V-Safe system.   Mr. Hudgins was instructed to call 911 with any severe reactions post vaccine: Marland Kitchen Difficulty breathing  . Swelling of face and throat  . A fast heartbeat  . A bad rash all over body  . Dizziness and weakness   Immunizations Administered    Name Date Dose VIS Date Route   Moderna Covid-19 Booster Vaccine 02/19/2020 11:20 AM 0.25 mL 12/12/2019 Intramuscular   Manufacturer: Gala Murdoch   Lot: [id number]   NDC: 56433-295-18

## 2020-02-26 ENCOUNTER — Other Ambulatory Visit (HOSPITAL_COMMUNITY): Payer: Self-pay

## 2020-02-26 NOTE — Progress Notes (Signed)
Paramedicine Encounter    Patient ID: Russell Collier, male    DOB: 1960-11-08, 60 y.o.   MRN: [id number]   Patient Care Team: [name], MD as PCP - General (Family Medicine) [name], MD as PCP - Cardiology (Cardiology) Michael Boston, MD as Consulting Physician (General Surgery) Wilford Corner, MD as Consulting Physician (Gastroenterology) Jorge Ny, LCSW as Social Worker (Licensed Clinical Social Worker) Alda Berthold, DO as Consulting Physician (Neurology) Jola Baptist, [place] as Referring Physician (Chiropractic Medicine)  Patient Active Problem List   Diagnosis Date Noted  . Coagulation defect ([name]) 12/11/2019  . Fatigue 12/10/2019  . Leg cramps 12/10/2019  . Mild epistaxis 10/30/2019  . Lower extremity edema   . 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 Acute respiratory failure ([place]) 07/27/2019  . Breast tenderness in male 05/31/2019  . Chronic pain 05/31/2019  . AKI (acute kidney injury) ([place]) 05/31/2019  . Pain due to onychomycosis of toenails of both feet 05/16/2019  . Chronic knee pain 04/16/2019  . Diabetes ([place]) 04/16/2019  . Esophageal reflux 02/06/2019  . Heartburn 02/06/2019  . Chronic skin ulcer of lower leg ([place]) 12/21/2018  . S/P drug eluting coronary stent placement   . Coronary artery disease involving native coronary artery of native heart with unstable angina pectoris ([place]) 12/20/2018  . Unstable angina ([place])   . 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 Acute on chronic diastolic heart failure ([place])   . Shortness of breath   . Precordial chest pain   . Idiopathic peripheral neuropathy   . Palpitations   . Chronic diastolic heart failure ([place])   . Abnormal ankle brachial index (ABI)   . Chest pain, rule out acute myocardial infarction 09/26/2018  . Dyspepsia   . Morbid obesity ([place])   . OSA on CPAP   . Chest pain 05/30/2018  . CAD (coronary artery disease) 03/30/2018  . Elevated troponin 02/03/2018  . Positive urine drug screen 02/03/2018   . Tobacco abuse 02/03/2018  . Hyperlipidemia 02/03/2018  . Acute respiratory failure with hypoxia ([place]) 02/02/2018  . Acute congestive heart failure ([place])   . Atypical chest pain 12/27/2015  . Essential hypertension   . Neuropathy     Current Outpatient Medications:  .  Accu-Chek Softclix Lancets lancets, Use as instructed, Disp: 100 each, Rfl: 12 .  albuterol (PROVENTIL) (2.5 MG/3ML) 0.083% nebulizer solution, Take 2.5 mg by nebulization every 6 (six) hours as needed for wheezing or shortness of breath., Disp: , Rfl:  .  albuterol (VENTOLIN HFA) 108 (90 Base) MCG/ACT inhaler, Inhale 2 puffs into the lungs every 6 (six) hours as needed for wheezing or shortness of breath., Disp: , Rfl:  .  apixaban (ELIQUIS) 2.5 MG TABS tablet, Take 1 tablet (2.5 mg total) by mouth 2 (two) times daily., Disp: 60 tablet, Rfl: 11 .  atorvastatin (LIPITOR) 80 MG tablet, Take 1 tablet (80 mg total) by mouth daily., Disp: 90 tablet, Rfl: 3 .  Blood Glucose Monitoring Suppl (ACCU-CHEK GUIDE) w/Device KIT, 1 Units by Does not apply route daily., Disp: 1 kit, Rfl: 0 .  dapagliflozin propanediol (FARXIGA) 10 MG TABS tablet, Take 1 tablet (10 mg total) by mouth daily before breakfast., Disp: 30 tablet, Rfl: 6 .  enalapril (VASOTEC) 20 MG tablet, Take 20 mg by mouth at bedtime., Disp: , Rfl:  .  fluticasone (FLONASE) 50 MCG/ACT nasal spray, PLACE 1 SPRAY INTO BOTH NOSTRILS DAILY., Disp: 16 g, Rfl: 11 .  furosemide (LASIX) 40 MG tablet, TAKE ONE TABLET  BY MOUTH TWICE DAILY, Disp: 180 tablet, Rfl: 3 .  glucose blood (ACCU-CHEK GUIDE) test strip, Use as instructed, Disp: 100 each, Rfl: 12 .  isosorbide mononitrate (IMDUR) 30 MG 24 hr tablet, Take 1 tablet (30 mg total) by mouth daily., Disp: 90 tablet, Rfl: 3 .  Multiple Vitamin (MULTIVITAMIN WITH MINERALS) TABS tablet, Take 1 tablet by mouth daily., Disp: , Rfl:  .  pantoprazole (PROTONIX) 40 MG tablet, TAKE 1 TABLET (40 MG TOTAL) BY MOUTH DAILY., Disp: 90 tablet, Rfl:  0 .  potassium chloride SA (KLOR-CON) 20 MEQ tablet, TAKE 1 TABLET(20 MEQ) BY MOUTH DAILY, Disp: 90 tablet, Rfl: 3 .  pregabalin (LYRICA) 100 MG capsule, TAKE ONE CAPSULE BY MOUTH THREE TIMES DAILY, Disp: 270 capsule, Rfl: 2 .  umeclidinium-vilanterol (ANORO ELLIPTA) 62.5-25 MCG/INH AEPB, Inhale 1 puff into the lungs daily as needed., Disp: , Rfl:  .  valACYclovir (VALTREX) 500 MG tablet, Take 1 tablet (500 mg total) by mouth daily., Disp: 90 tablet, Rfl: 3 .  VYNDAMAX 61 MG CAPS, TAKE 1 CAPSULE BY MOUTH DAILY., Disp: 30 capsule, Rfl: 11  Current Facility-Administered Medications:  .  methylPREDNISolone acetate (DEPO-MEDROL) injection 40 mg, 40 mg, Intramuscular, Once, [name], MD Allergies  Allergen Reactions  . Varenicline Other (See Comments)    Patient reports laryngospasm stopped in the ER  . Bupropion Other (See Comments)    Headache - moderate/severe - self discontinued agent      Social History   Socioeconomic History  . Marital status: Divorced    Spouse name: Not on file  . Number of children: 2  . Years of education: 10  . Highest education level: Not on file  Occupational History  . Occupation: not employed  Tobacco Use  . Smoking status: Current Every Day Smoker    Packs/day: 0.50    Years: 54.00    Pack years: 27.00    Types: Cigarettes, Cigars  . Smokeless tobacco: Never Used  . Tobacco comment: half pack daily  Vaping Use  . Vaping Use: Never used  Substance and Sexual Activity  . Alcohol use: Yes    Alcohol/week: 4.0 standard drinks    Types: 2 Cans of beer, 2 Shots of liquor per week  . Drug use: Not Currently    Comment: last use may last year  . Sexual activity: Yes  Other Topics Concern  . Not on file  Social History Narrative   Right handed   Two story home   No caffeine   Social Determinants of Health   Financial Resource Strain: Low Risk   . Difficulty of Paying Living Expenses: Not very hard  Food Insecurity: No Food Insecurity   . Worried About Charity fundraiser in the Last Year: Never true  . Ran Out of Food in the Last Year: Never true  Transportation Needs: No Transportation Needs  . Lack of Transportation (Medical): No  . Lack of Transportation (Non-Medical): No  Physical Activity: Not on file  Stress: Not on file  Social Connections: Not on file  Intimate Partner Violence: Not on file    Physical Exam Vitals reviewed.  Constitutional:      Appearance: He is normal weight.  HENT:     Head: Normocephalic.     Nose: Nose normal.     Mouth/Throat:     Mouth: Mucous membranes are moist.     Pharynx: Oropharynx is clear.  Eyes:     Pupils: Pupils are equal, round, and reactive  to light.  Cardiovascular:     Rate and Rhythm: Normal rate and regular rhythm.     Pulses: Normal pulses.     Heart sounds: Normal heart sounds.  Pulmonary:     Effort: Pulmonary effort is normal.     Breath sounds: Wheezing present.  Abdominal:     General: Abdomen is flat.     Palpations: Abdomen is soft.  Musculoskeletal:        General: Normal range of motion.     Cervical back: Normal range of motion.     Right lower leg: No edema.     Left lower leg: No edema.  Skin:    General: Skin is warm and dry.     Capillary Refill: Capillary refill takes less than 2 seconds.  Neurological:     General: No focal deficit present.     Mental Status: He is alert. Mental status is at baseline.  Psychiatric:        Mood and Affect: Mood normal.     Arrived for home visit for Jawaun who was alert and oriented reporting chronic lower back pain. Mikkel reports he has had increased lower back pain with movement over the last several days. Patient does not currently have consult for his back doctor. I told patient to call and follow up, he agreed. Vitals were obtained and are as noted. No edema noted, no JVD, lung sounds noted to have a slight wheeze. Patient continues to smoke avidly. We discussed cessation. Patient reports he  has had no nose bleeds. Limmie expressed interest in bubble packs through [employer]. I will get these set up tomorrow. Patient medications reviewed and confirmed. Pill boxes set up accordingly. I assisted patient in setting up eye doctor appointment at [facility]. Fri. March 4th at 1345. [address] [place] [address].  Patient requesting follow up with PCP, I will message him for appointment.   Patient also inquiring about dermatology, I will get insurance approved list.   Home visit complete. I will see patient in two weeks.   Refills- Atorvastatin     Future Appointments  Date Time Provider [facility]  03/18/2020  3:45 PM Gardiner Barefoot, DPM [facility]  03/20/2020  1:30 PM Laurin Coder, MD [facility] None     ACTION: Home visit completed Next visit planned for two weeks

## 2020-03-04 ENCOUNTER — Telehealth: Payer: Self-pay | Admitting: Pharmacist

## 2020-03-04 NOTE — Telephone Encounter (Signed)
Noted and agree. 

## 2020-03-04 NOTE — Telephone Encounter (Signed)
Contacted patient RE tobacco cessation / reduction.    Patient shared that he is going to a back surgery evaluation at "[facility]" on Friday (1/14).  He is looking forward anything that can help him reduce his pain.  He is currently taking ibuprofen 800mg  for pain (he no longer takes dose> 800mg ).   He expresses interest on quitting smoking.  His goal is to quit by "summer".  He does not have any quit plan for the upcoming short-term.   He states he has all the things he needs including nicotine patches which have helped in the past.    After a short discussion he agreed to a repeat call in 2-3 weeks.  Readdress reduction / quit plan at that time.

## 2020-03-05 ENCOUNTER — Ambulatory Visit (INDEPENDENT_AMBULATORY_CARE_PROVIDER_SITE_OTHER): Payer: Medicaid Other | Admitting: Family Medicine

## 2020-03-05 ENCOUNTER — Other Ambulatory Visit: Payer: Self-pay

## 2020-03-05 ENCOUNTER — Encounter: Payer: Self-pay | Admitting: Family Medicine

## 2020-03-05 VITALS — BP 124/80 | HR 75 | Ht 70.0 in | Wt 305.1 lb

## 2020-03-05 DIAGNOSIS — M25569 Pain in unspecified knee: Secondary | ICD-10-CM

## 2020-03-05 DIAGNOSIS — I5032 Chronic diastolic (congestive) heart failure: Secondary | ICD-10-CM | POA: Diagnosis not present

## 2020-03-05 DIAGNOSIS — E1151 Type 2 diabetes mellitus with diabetic peripheral angiopathy without gangrene: Secondary | ICD-10-CM | POA: Diagnosis present

## 2020-03-05 DIAGNOSIS — I10 Essential (primary) hypertension: Secondary | ICD-10-CM | POA: Diagnosis not present

## 2020-03-05 DIAGNOSIS — L989 Disorder of the skin and subcutaneous tissue, unspecified: Secondary | ICD-10-CM

## 2020-03-05 DIAGNOSIS — M25561 Pain in right knee: Secondary | ICD-10-CM

## 2020-03-05 DIAGNOSIS — I5030 Unspecified diastolic (congestive) heart failure: Secondary | ICD-10-CM

## 2020-03-05 DIAGNOSIS — Z1159 Encounter for screening for other viral diseases: Secondary | ICD-10-CM

## 2020-03-05 DIAGNOSIS — E785 Hyperlipidemia, unspecified: Secondary | ICD-10-CM | POA: Diagnosis not present

## 2020-03-05 DIAGNOSIS — G8929 Other chronic pain: Secondary | ICD-10-CM | POA: Diagnosis not present

## 2020-03-05 DIAGNOSIS — Z23 Encounter for immunization: Secondary | ICD-10-CM

## 2020-03-05 DIAGNOSIS — M25562 Pain in left knee: Secondary | ICD-10-CM

## 2020-03-05 DIAGNOSIS — G4733 Obstructive sleep apnea (adult) (pediatric): Secondary | ICD-10-CM | POA: Diagnosis not present

## 2020-03-05 LAB — POCT GLYCOSYLATED HEMOGLOBIN (HGB A1C): Hemoglobin A1C: 6 % — AB (ref 4.0–5.6)

## 2020-03-05 MED ORDER — DAPAGLIFLOZIN PROPANEDIOL 10 MG PO TABS: 10.0000 mg | ORAL_TABLET | Freq: Every day | ORAL | 6 refills | Status: DC

## 2020-03-05 MED ORDER — PREGABALIN 100 MG PO CAPS: ORAL_CAPSULE | ORAL | 2 refills | Status: DC

## 2020-03-05 MED ORDER — FUROSEMIDE 40 MG PO TABS: 40.0000 mg | ORAL_TABLET | Freq: Two times a day (BID) | ORAL | 3 refills | Status: DC

## 2020-03-05 MED ORDER — ANORO ELLIPTA 62.5-25 MCG/INH IN AEPB: 1.0000 | INHALATION_SPRAY | Freq: Every day | RESPIRATORY_TRACT | 3 refills | Status: DC | PRN

## 2020-03-05 MED ORDER — ALBUTEROL SULFATE HFA 108 (90 BASE) MCG/ACT IN AERS: 2.0000 | INHALATION_SPRAY | Freq: Four times a day (QID) | RESPIRATORY_TRACT | 3 refills | Status: DC | PRN

## 2020-03-05 MED ORDER — APIXABAN 2.5 MG PO TABS: 2.5000 mg | ORAL_TABLET | Freq: Two times a day (BID) | ORAL | 11 refills | Status: DC

## 2020-03-05 MED ORDER — ISOSORBIDE MONONITRATE ER 30 MG PO TB24: 30.0000 mg | ORAL_TABLET | Freq: Every day | ORAL | 3 refills | Status: DC

## 2020-03-05 MED ORDER — POTASSIUM CHLORIDE CRYS ER 20 MEQ PO TBCR: EXTENDED_RELEASE_TABLET | ORAL | 3 refills | Status: DC

## 2020-03-05 MED ORDER — METHYLPREDNISOLONE ACETATE 40 MG/ML IJ SUSP
40.0000 mg | Freq: Once | INTRAMUSCULAR | Status: AC
  Administered 2020-03-05: 40 mg via INTRAMUSCULAR

## 2020-03-05 MED ORDER — FLUTICASONE PROPIONATE 50 MCG/ACT NA SUSP: 1.0000 | Freq: Every day | NASAL | 11 refills | Status: DC

## 2020-03-05 MED ORDER — ENALAPRIL MALEATE 20 MG PO TABS: 20.0000 mg | ORAL_TABLET | Freq: Every day | ORAL | 3 refills | Status: DC

## 2020-03-05 MED ORDER — PANTOPRAZOLE SODIUM 40 MG PO TBEC: 40.0000 mg | DELAYED_RELEASE_TABLET | Freq: Every day | ORAL | 0 refills | Status: DC

## 2020-03-05 MED ORDER — ATORVASTATIN CALCIUM 80 MG PO TABS: 80.0000 mg | ORAL_TABLET | Freq: Every day | ORAL | 3 refills | Status: DC

## 2020-03-05 MED ORDER — VALACYCLOVIR HCL 500 MG PO TABS: 500.0000 mg | ORAL_TABLET | Freq: Every day | ORAL | 3 refills | Status: DC

## 2020-03-05 NOTE — Patient Instructions (Signed)
Is doing greatIt was great seeing you today, your globin A1c is 6.0 from 6.7.  I am going to collect some lab work on you today including a cholesterol panel as well as screening for hepatitis C.  We are giving you an injection in your right knee today.

## 2020-03-05 NOTE — Progress Notes (Signed)
    SUBJECTIVE:   CHIEF COMPLAINT / HPI:   Derm issues  Patient reports that he has a lesion on his nose which he would like taken care of. He also reports having dry skin on his elbows. The lesion on his nose he has seen a provider for in the past and they "stuck a needle in it" but it keeps returning. He asks the previous provider to cut it off and he said that he would need to be referred to dermatology for this. He is requesting a referral to dermatology today for this issue.  Diabetes Patient reports that he feels his blood sugars are doing well and thinks that his hemoglobin A1c has improved today. Denies any signs or symptoms of hypoglycemia. Reports good medication compliance.  Knee concerns Patient reports continued bilateral knee pain and he would like injections in both knees. His last injection was in October and his right knee. Reports that his right knee is bothering him the most today and if he can only get 1 injection he would like the right knee done.   OBJECTIVE:   BP 124/80   Pulse 75   Ht 5\' 10"  (1.778 m)   Wt (!) 305 lb 2 oz (138.4 kg)   SpO2 97%   BMI 43.78 kg/m   General: Well-appearing, no acute distress, walking with a cane Respiratory: Normal work of breathing, lungs clear to auscultation bilaterally Cardiac: Regular rate and rhythm, no murmurs appreciated MSK: Crepitus noted in right knee and left knee. No edema or erythema noted. Able to ambulate with a cane Derm: Patient has a lesion on left nostril with multiple blackheads, see image below     After informed written consent timeout was performed, patient was lying supine on exam table. Right knee was prepped with alcohol swab.  Utilizing superolateral approach, 3 mL of lidocaine was used for local anesthesia.  Knee was then injected with 3:1 lidocaine:depomedrol.  Patient tolerated procedure well without immediate complications.  ASSESSMENT/PLAN:   Diabetes [facility]) Patient reports he is had good  compliance with his diabetes medications. Hemoglobin A1c of 6.0 today from 6.4 at previous check. Denies any signs or symptoms of hypoglycemia. - Congratulated patient on his hard work and recommended continued work on his dietary changes and weight loss - No medication changes at this time - Diabetes follow-up in 3 months  Hyperlipidemia Currently on statin management. Reports good compliance with his medications. - Lipid panel collected today - Repeat in 1 year  Chronic knee pain Chronic bilateral knee pain which continues to be worse in the right than the left. Most recent knee injection was 12/10/2019. Patient would like injections in both knees today. The injection provided to right knee today and will follow-up in 2 weeks for left knee injection. Patient is pleased with his care and reports that the injection helped with his knee pain at this time.     [name], MD [place]

## 2020-03-06 LAB — LIPID PANEL
Chol/HDL Ratio: 3.8 ratio (ref 0.0–5.0)
Cholesterol, Total: 125 mg/dL (ref 100–199)
HDL: 33 mg/dL — ABNORMAL LOW (ref >39)
LDL Chol Calc (NIH): 76 mg/dL (ref 0–99)
Triglycerides: 82 mg/dL (ref 0–149)
VLDL Cholesterol Cal: 16 mg/dL (ref 5–40)

## 2020-03-06 LAB — HEPATITIS C ANTIBODY: Hep C Virus Ab: <0.1 s/co ratio (ref 0.0–0.9)

## 2020-03-06 NOTE — Assessment & Plan Note (Signed)
Currently on statin management. Reports good compliance with his medications. - Lipid panel collected today - Repeat in 1 year

## 2020-03-06 NOTE — Assessment & Plan Note (Signed)
Patient reports he is had good compliance with his diabetes medications. Hemoglobin A1c of 6.0 today from 6.4 at previous check. Denies any signs or symptoms of hypoglycemia. - Congratulated patient on his hard work and recommended continued work on his dietary changes and weight loss - No medication changes at this time - Diabetes follow-up in 3 months

## 2020-03-06 NOTE — Assessment & Plan Note (Signed)
Chronic bilateral knee pain which continues to be worse in the right than the left. Most recent knee injection was 12/10/2019. Patient would like injections in both knees today. The injection provided to right knee today and will follow-up in 2 weeks for left knee injection. Patient is pleased with his care and reports that the injection helped with his knee pain at this time.

## 2020-03-07 DIAGNOSIS — M5416 Radiculopathy, lumbar region: Secondary | ICD-10-CM | POA: Diagnosis not present

## 2020-03-07 DIAGNOSIS — M48062 Spinal stenosis, lumbar region with neurogenic claudication: Secondary | ICD-10-CM | POA: Diagnosis not present

## 2020-03-07 DIAGNOSIS — M5136 Other intervertebral disc degeneration, lumbar region: Secondary | ICD-10-CM | POA: Diagnosis not present

## 2020-03-07 DIAGNOSIS — M4316 Spondylolisthesis, lumbar region: Secondary | ICD-10-CM | POA: Diagnosis not present

## 2020-03-11 ENCOUNTER — Telehealth (HOSPITAL_COMMUNITY): Payer: Self-pay

## 2020-03-11 NOTE — Telephone Encounter (Signed)
Spoke to Russell Collier who reports he has some paperwork that needs to be signed from his PCP to sign off for his back surgery. I will retreive paperwork from Russell Collier this week. Russell Collier reports his medication bubble packs got delivered.  I will continue to follow up.

## 2020-03-13 MED FILL — VYNDAMAX 61 MG CAPS: 61 | 30 days supply | Qty: 30 | Fill #3

## 2020-03-17 ENCOUNTER — Other Ambulatory Visit (HOSPITAL_COMMUNITY): Payer: Self-pay

## 2020-03-17 NOTE — Progress Notes (Signed)
Paramedicine Encounter    Patient ID: Russell Collier, male    DOB: 1960/05/09, 60 y.o.   MRN: [id number]   Patient Care Team: [name], MD as PCP - General (Family Medicine) [name], MD as PCP - Cardiology (Cardiology) Michael Boston, MD as Consulting Physician (General Surgery) Wilford Corner, MD as Consulting Physician (Gastroenterology) Jorge Ny, LCSW as Social Worker (Licensed Clinical Social Worker) Alda Berthold, DO as Consulting Physician (Neurology) Jola Baptist, [place] as Referring Physician (Chiropractic Medicine)  Patient Active Problem List   Diagnosis Date Noted  . Coagulation defect ([place]) 12/11/2019  . Fatigue 12/10/2019  . Leg cramps 12/10/2019  . Mild epistaxis 10/30/2019  . Lower extremity edema   . 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 Acute respiratory failure ([place]) 07/27/2019  . Breast tenderness in male 05/31/2019  . Chronic pain 05/31/2019  . AKI (acute kidney injury) ([place]) 05/31/2019  . Pain due to onychomycosis of toenails of both feet 05/16/2019  . Chronic knee pain 04/16/2019  . Diabetes ([place]) 04/16/2019  . Esophageal reflux 02/06/2019  . Heartburn 02/06/2019  . Chronic skin ulcer of lower leg ([place]) 12/21/2018  . S/P drug eluting coronary stent placement   . Coronary artery disease involving native coronary artery of native heart with unstable angina pectoris ([place]) 12/20/2018  . Unstable angina ([employer])   . 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 Acute on chronic diastolic heart failure ([place])   . Shortness of breath   . Precordial chest pain   . Idiopathic peripheral neuropathy   . Palpitations   . Chronic diastolic heart failure ([place])   . Abnormal ankle brachial index (ABI)   . Chest pain, rule out acute myocardial infarction 09/26/2018  . Dyspepsia   . Morbid obesity ([place])   . OSA on CPAP   . Chest pain 05/30/2018  . CAD (coronary artery disease) 03/30/2018  . Elevated troponin 02/03/2018  . Positive urine drug screen 02/03/2018   . Tobacco abuse 02/03/2018  . Hyperlipidemia 02/03/2018  . Acute respiratory failure with hypoxia ([place]) 02/02/2018  . Acute congestive heart failure ([place])   . Atypical chest pain 12/27/2015  . Essential hypertension   . Neuropathy     Current Outpatient Medications:  .  Accu-Chek Softclix Lancets lancets, Use as instructed, Disp: 100 each, Rfl: 12 .  albuterol (PROVENTIL) (2.5 MG/3ML) 0.083% nebulizer solution, Take 2.5 mg by nebulization every 6 (six) hours as needed for wheezing or shortness of breath., Disp: , Rfl:  .  albuterol (VENTOLIN HFA) 108 (90 Base) MCG/ACT inhaler, Inhale 2 puffs into the lungs every 6 (six) hours as needed for wheezing or shortness of breath., Disp: 18 g, Rfl: 3 .  apixaban (ELIQUIS) 2.5 MG TABS tablet, Take 1 tablet (2.5 mg total) by mouth 2 (two) times daily., Disp: 60 tablet, Rfl: 11 .  atorvastatin (LIPITOR) 80 MG tablet, Take 1 tablet (80 mg total) by mouth daily., Disp: 90 tablet, Rfl: 3 .  Blood Glucose Monitoring Suppl (ACCU-CHEK GUIDE) w/Device KIT, 1 Units by Does not apply route daily., Disp: 1 kit, Rfl: 0 .  dapagliflozin propanediol (FARXIGA) 10 MG TABS tablet, Take 1 tablet (10 mg total) by mouth daily before breakfast., Disp: 30 tablet, Rfl: 6 .  enalapril (VASOTEC) 20 MG tablet, Take 1 tablet (20 mg total) by mouth at bedtime., Disp: 90 tablet, Rfl: 3 .  fluticasone (FLONASE) 50 MCG/ACT nasal spray, Place 1 spray into both nostrils daily., Disp: 16 g, Rfl: 11 .  furosemide (LASIX) 40  MG tablet, Take 1 tablet (40 mg total) by mouth 2 (two) times daily., Disp: 180 tablet, Rfl: 3 .  glucose blood (ACCU-CHEK GUIDE) test strip, Use as instructed, Disp: 100 each, Rfl: 12 .  isosorbide mononitrate (IMDUR) 30 MG 24 hr tablet, Take 1 tablet (30 mg total) by mouth daily., Disp: 90 tablet, Rfl: 3 .  Multiple Vitamin (MULTIVITAMIN WITH MINERALS) TABS tablet, Take 1 tablet by mouth daily., Disp: , Rfl:  .  pantoprazole (PROTONIX) 40 MG tablet, Take 1 tablet  (40 mg total) by mouth daily., Disp: 90 tablet, Rfl: 0 .  potassium chloride SA (KLOR-CON) 20 MEQ tablet, TAKE 1 TABLET(20 MEQ) BY MOUTH DAILY, Disp: 90 tablet, Rfl: 3 .  pregabalin (LYRICA) 100 MG capsule, TAKE ONE CAPSULE BY MOUTH THREE TIMES DAILY, Disp: 270 capsule, Rfl: 2 .  umeclidinium-vilanterol (ANORO ELLIPTA) 62.5-25 MCG/INH AEPB, Inhale 1 puff into the lungs daily as needed., Disp: 60 each, Rfl: 3 .  valACYclovir (VALTREX) 500 MG tablet, Take 1 tablet (500 mg total) by mouth daily., Disp: 90 tablet, Rfl: 3 .  VYNDAMAX 61 MG CAPS, TAKE 1 CAPSULE BY MOUTH DAILY., Disp: 30 capsule, Rfl: 11  Current Facility-Administered Medications:  .  methylPREDNISolone acetate (DEPO-MEDROL) injection 40 mg, 40 mg, Intramuscular, Once, [name], MD Allergies  Allergen Reactions  . Varenicline Other (See Comments)    Patient reports laryngospasm stopped in the ER  . Bupropion Other (See Comments)    Headache - moderate/severe - self discontinued agent      Social History   Socioeconomic History  . Marital status: Divorced    Spouse name: Not on file  . Number of children: 2  . Years of education: 10  . Highest education level: Not on file  Occupational History  . Occupation: not employed  Tobacco Use  . Smoking status: Current Every Day Smoker    Packs/day: 0.50    Years: 54.00    Pack years: 27.00    Types: Cigarettes, Cigars  . Smokeless tobacco: Never Used  . Tobacco comment: half pack daily  Vaping Use  . Vaping Use: Never used  Substance and Sexual Activity  . Alcohol use: Yes    Alcohol/week: 4.0 standard drinks    Types: 2 Cans of beer, 2 Shots of liquor per week  . Drug use: Not Currently    Comment: last use may last year  . Sexual activity: Yes  Other Topics Concern  . Not on file  Social History Narrative   Right handed   Two story home   No caffeine   Social Determinants of Health   Financial Resource Strain: Low Risk   . Difficulty of Paying Living  Expenses: Not very hard  Food Insecurity: No Food Insecurity  . Worried About Charity fundraiser in the Last Year: Never true  . Ran Out of Food in the Last Year: Never true  Transportation Needs: No Transportation Needs  . Lack of Transportation (Medical): No  . Lack of Transportation (Non-Medical): No  Physical Activity: Not on file  Stress: Not on file  Social Connections: Not on file  Intimate Partner Violence: Not on file    Physical Exam Vitals reviewed.  Constitutional:      Appearance: He is normal weight.  HENT:     Head: Normocephalic.     Nose: Nose normal.     Mouth/Throat:     Mouth: Mucous membranes are moist.     Pharynx: Oropharynx is clear.  Eyes:  Pupils: Pupils are equal, round, and reactive to light.  Cardiovascular:     Rate and Rhythm: Normal rate and regular rhythm.     Pulses: Normal pulses.     Heart sounds: Normal heart sounds.  Pulmonary:     Effort: Pulmonary effort is normal.     Breath sounds: Normal breath sounds.  Abdominal:     General: Abdomen is flat.     Palpations: Abdomen is soft.  Musculoskeletal:        General: Normal range of motion.     Cervical back: Normal range of motion.     Right lower leg: No edema.     Left lower leg: No edema.  Skin:    General: Skin is warm and dry.     Capillary Refill: Capillary refill takes less than 2 seconds.  Neurological:     General: No focal deficit present.     Mental Status: He is alert. Mental status is at baseline.  Psychiatric:        Mood and Affect: Mood normal.     Arrived for home visit for Russell Collier who reports he is doing okay other than his chronic back pain. Russell Collier denied shortness of breath, dizziness, chest pain.  Russell Collier has bubble packs, I confirmed same. All is correct. Russell Collier asking about getting Vyndamax for free, his current copay is $3. I will reach out to pharmacy team for same. Vitals obtained. Appointments reviewed and confirmed. Russell Collier agreed with home visit in  two weeks. I will drop off paperwork to PCP for back surgery clearance. Visit complete.   Refills: NONE    Future Appointments  Date Time Provider [place]  03/18/2020  3:15 PM [name], DPM [facility]  03/18/2020  3:45 PM Gardiner Barefoot, DPM [facility]  03/20/2020  1:30 PM Laurin Coder, MD [facility] None     ACTION: Home visit completed Next visit planned for two weeks

## 2020-03-18 ENCOUNTER — Ambulatory Visit: Payer: Medicaid Other | Admitting: Podiatry

## 2020-03-18 ENCOUNTER — Other Ambulatory Visit: Payer: Self-pay

## 2020-03-18 ENCOUNTER — Ambulatory Visit (INDEPENDENT_AMBULATORY_CARE_PROVIDER_SITE_OTHER): Payer: Medicaid Other | Admitting: Podiatry

## 2020-03-18 ENCOUNTER — Telehealth: Payer: Self-pay | Admitting: Family Medicine

## 2020-03-18 DIAGNOSIS — D689 Coagulation defect, unspecified: Secondary | ICD-10-CM | POA: Diagnosis not present

## 2020-03-18 DIAGNOSIS — B351 Tinea unguium: Secondary | ICD-10-CM

## 2020-03-18 DIAGNOSIS — M109 Gout, unspecified: Secondary | ICD-10-CM

## 2020-03-18 MED ORDER — BETAMETHASONE SOD PHOS & ACET 6 (3-3) MG/ML IJ SUSP
3.0000 mg | Freq: Once | INTRAMUSCULAR | Status: AC
  Administered 2020-03-18: 3 mg

## 2020-03-18 NOTE — Progress Notes (Signed)
  Subjective:  Patient ID: Russell Collier, male    DOB: 10-Apr-1960,  MRN: [id number]  Chief Complaint  Patient presents with  . trimming    BL nails trimming -pt not diabetic -pt on eliquis    60 y.o. male presents with the above complaint. History confirmed with patient. Also complains of recent flare up of his right foot bunion. Does have a hx of gout. Did not trial any treatments for it, it is still hurting but not as much now.  Objective:  Physical Exam: warm, good capillary refill, no trophic changes or ulcerative lesions, normal DP and PT pulses and normal sensory exam.  Right Foot: POP R 1st MPJ, resolving erythema, no active warmth. Prominent HAV deformity.  Assessment:   1. Acute gout involving toe of right foot, unspecified cause   2. Coagulation defect ([place])   3. Onychomycosis      Plan:  Patient was evaluated and treated and all questions answered.  Gout R 1st MPJ -Injection as below -Educated on gout diet  Onychomycosis, coag defect -Nails debrided x10   Procedure: Nail Debridement Type of Debridement: manual, sharp debridement. Instrumentation: Nail nipper, rotary burr. Number of Nails: 10  No follow-ups on file.

## 2020-03-18 NOTE — Patient Instructions (Signed)
Gout  Gout is a condition that causes painful swelling of the joints. Gout is a type of inflammation of the joints (arthritis). This condition is caused by having too much uric acid in the body. Uric acid is a chemical that forms when the body breaks down substances called purines. Purines are important for building body proteins. When the body has too much uric acid, sharp crystals can form and build up inside the joints. This causes pain and swelling. Gout attacks can happen quickly and may be very painful (acute gout). Over time, the attacks can affect more joints and become more frequent (chronic gout). Gout can also cause uric acid to build up under the skin and inside the kidneys. What are the causes? This condition is caused by too much uric acid in your blood. This can happen because:  Your kidneys do not remove enough uric acid from your blood. This is the most common cause.  Your body makes too much uric acid. This can happen with some cancers and cancer treatments. It can also occur if your body is breaking down too many red blood cells (hemolytic anemia).  You eat too many foods that are high in purines. These foods include organ meats and some seafood. Alcohol, especially beer, is also high in purines. A gout attack may be triggered by trauma or stress. What increases the risk? You are more likely to develop this condition if you:  Have a family history of gout.  Are male and middle-aged.  Are male and have gone through menopause.  Are obese.  Frequently drink alcohol, especially beer.  Are dehydrated.  Lose weight too quickly.  Have an organ transplant.  Have lead poisoning.  Take certain medicines, including aspirin, cyclosporine, diuretics, levodopa, and niacin.  Have kidney disease.  Have a skin condition called psoriasis. What are the signs or symptoms? An attack of acute gout happens quickly. It usually occurs in just one joint. The most common place is  the big toe. Attacks often start at night. Other joints that may be affected include joints of the feet, ankle, knee, fingers, wrist, or elbow. Symptoms of this condition may include:  Severe pain.  Warmth.  Swelling.  Stiffness.  Tenderness. The affected joint may be very painful to touch.  Shiny, red, or purple skin.  Chills and fever. Chronic gout may cause symptoms more frequently. More joints may be involved. You may also have white or yellow lumps (tophi) on your hands or feet or in other areas near your joints.   How is this diagnosed? This condition is diagnosed based on your symptoms, medical history, and physical exam. You may have tests, such as:  Blood tests to measure uric acid levels.  Removal of joint fluid with a thin needle (aspiration) to look for uric acid crystals.  X-rays to look for joint damage. How is this treated? Treatment for this condition has two phases: treating an acute attack and preventing future attacks. Acute gout treatment may include medicines to reduce pain and swelling, including:  NSAIDs.  Steroids. These are strong anti-inflammatory medicines that can be taken by mouth (orally) or injected into a joint.  Colchicine. This medicine relieves pain and swelling when it is taken soon after an attack. It can be given by mouth or through an IV. Preventive treatment may include:  Daily use of smaller doses of NSAIDs or colchicine.  Use of a medicine that reduces uric acid levels in your blood.  Changes to your diet.   You may need to see a dietitian about what to eat and drink to prevent gout. Follow these instructions at home: During a gout attack  If directed, put ice on the affected area: ? Put ice in a plastic bag. ? Place a towel between your skin and the bag. ? Leave the ice on for 20 minutes, 2-3 times a day.  Raise (elevate) the affected joint above the level of your heart as often as possible.  Rest the joint as much as possible.  If the affected joint is in your leg, you may be given crutches to use.  Follow instructions from your health care provider about eating or drinking restrictions.   Avoiding future gout attacks  Follow a low-purine diet as told by your dietitian or health care provider. Avoid foods and drinks that are high in purines, including liver, kidney, anchovies, asparagus, herring, mushrooms, mussels, and beer.  Maintain a healthy weight or lose weight if you are overweight. If you want to lose weight, talk with your health care provider. It is important that you do not lose weight too quickly.  Start or maintain an exercise program as told by your health care provider. Eating and drinking  Drink enough fluids to keep your urine pale yellow.  If you drink alcohol: ? Limit how much you use to:  0-1 drink a day for women.  0-2 drinks a day for men. ? Be aware of how much alcohol is in your drink. In the [place] one drink equals one 12 oz bottle of beer (355 mL) one 5 oz glass of wine (148 mL), or one 1 oz glass of hard liquor (44 mL). General instructions  Take over-the-counter and prescription medicines only as told by your health care provider.  Do not drive or use heavy machinery while taking prescription pain medicine.  Return to your normal activities as told by your health care provider. Ask your health care provider what activities are safe for you.  Keep all follow-up visits as told by your health care provider. This is important. Contact a health care provider if you have:  Another gout attack.  Continuing symptoms of a gout attack after 10 days of treatment.  Side effects from your medicines.  Chills or a fever.  Burning pain when you urinate.  Pain in your lower back or belly. Get help right away if you:  Have severe or uncontrolled pain.  Cannot urinate. Summary  Gout is painful swelling of the joints caused by inflammation.  The most common site of pain is the big  toe, but it can affect other joints in the body.  Medicines and dietary changes can help to prevent and treat gout attacks. This information is not intended to replace advice given to you by your health care provider. Make sure you discuss any questions you have with your health care provider. Document Revised: 08/31/2017 Document Reviewed: 08/31/2017 Elsevier Patient Education  2021 Elsevier Inc.  

## 2020-03-18 NOTE — Telephone Encounter (Signed)
Heather from [employer] dropped off forms to be signed by doctor for Back surgery approval and faxed to [facility]. Last DOS: 03/05/2020. Contact number is [phone].  I am placing in [place] teams folder.    When forms are completed PLEASE RETURN TO me and I will take care of it and fax it.!  Thanks Amador Cunas

## 2020-03-20 ENCOUNTER — Encounter: Payer: Self-pay | Admitting: Pulmonary Disease

## 2020-03-20 ENCOUNTER — Ambulatory Visit (INDEPENDENT_AMBULATORY_CARE_PROVIDER_SITE_OTHER): Payer: Medicaid Other | Admitting: Pulmonary Disease

## 2020-03-20 ENCOUNTER — Other Ambulatory Visit: Payer: Self-pay

## 2020-03-20 VITALS — BP 110/60 | HR 80 | Temp 98.6°F | Ht 70.5 in | Wt 308.0 lb

## 2020-03-20 DIAGNOSIS — E1151 Type 2 diabetes mellitus with diabetic peripheral angiopathy without gangrene: Secondary | ICD-10-CM | POA: Diagnosis not present

## 2020-03-20 DIAGNOSIS — Z6841 Body Mass Index (BMI) 40.0 and over, adult: Secondary | ICD-10-CM

## 2020-03-20 DIAGNOSIS — E854 Organ-limited amyloidosis: Secondary | ICD-10-CM

## 2020-03-20 DIAGNOSIS — L97909 Non-pressure chronic ulcer of unspecified part of unspecified lower leg with unspecified severity: Secondary | ICD-10-CM | POA: Diagnosis not present

## 2020-03-20 DIAGNOSIS — I5032 Chronic diastolic (congestive) heart failure: Secondary | ICD-10-CM

## 2020-03-20 DIAGNOSIS — G63 Polyneuropathy in diseases classified elsewhere: Secondary | ICD-10-CM | POA: Diagnosis not present

## 2020-03-20 DIAGNOSIS — E851 Neuropathic heredofamilial amyloidosis: Secondary | ICD-10-CM | POA: Diagnosis not present

## 2020-03-20 DIAGNOSIS — I208 Other forms of angina pectoris: Secondary | ICD-10-CM

## 2020-03-20 DIAGNOSIS — G4733 Obstructive sleep apnea (adult) (pediatric): Secondary | ICD-10-CM

## 2020-03-20 DIAGNOSIS — J441 Chronic obstructive pulmonary disease with (acute) exacerbation: Secondary | ICD-10-CM | POA: Diagnosis not present

## 2020-03-20 DIAGNOSIS — I43 Cardiomyopathy in diseases classified elsewhere: Secondary | ICD-10-CM

## 2020-03-20 DIAGNOSIS — Z9989 Dependence on other enabling machines and devices: Secondary | ICD-10-CM | POA: Diagnosis not present
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
## 2020-03-20 NOTE — Patient Instructions (Signed)
We will send you for evaluation for an inspire device  You may require a new sleep study  As we discussed in the office, one of the requirements for an inspire device is that you have to have failed CPAP therapy  Continue weight loss efforts  Tentative follow-up in 3 months

## 2020-03-21 NOTE — Telephone Encounter (Signed)
Clinical info completed on Surgery Clearance form.  Place form in Dr. Hulen Luster box for completion.Please return to [facility] once completed so she can fax it.  Paxtyn Wisdom Zimmerman Rumple, CMA

## 2020-03-25 ENCOUNTER — Telehealth: Payer: Self-pay | Admitting: Pharmacist

## 2020-03-25 ENCOUNTER — Encounter: Payer: Self-pay | Admitting: Family Medicine

## 2020-03-25 ENCOUNTER — Other Ambulatory Visit: Payer: Self-pay

## 2020-03-25 ENCOUNTER — Ambulatory Visit (INDEPENDENT_AMBULATORY_CARE_PROVIDER_SITE_OTHER): Payer: Medicaid Other | Admitting: Family Medicine

## 2020-03-25 VITALS — BP 112/72 | HR 89 | Wt 310.0 lb

## 2020-03-25 DIAGNOSIS — M25569 Pain in unspecified knee: Secondary | ICD-10-CM

## 2020-03-25 DIAGNOSIS — M25562 Pain in left knee: Secondary | ICD-10-CM

## 2020-03-25 DIAGNOSIS — E1151 Type 2 diabetes mellitus with diabetic peripheral angiopathy without gangrene: Secondary | ICD-10-CM | POA: Diagnosis not present

## 2020-03-25 DIAGNOSIS — G8929 Other chronic pain: Secondary | ICD-10-CM | POA: Diagnosis not present

## 2020-03-25 DIAGNOSIS — I10 Essential (primary) hypertension: Secondary | ICD-10-CM | POA: Diagnosis not present

## 2020-03-25 DIAGNOSIS — M25561 Pain in right knee: Secondary | ICD-10-CM

## 2020-03-25 LAB — GLUCOSE, POCT (MANUAL RESULT ENTRY): POC Glucose: 103 mg/dl — AB (ref 70–99)

## 2020-03-25 MED ORDER — METHYLPREDNISOLONE ACETATE 40 MG/ML IJ SUSP
40.0000 mg | Freq: Once | INTRAMUSCULAR | Status: AC
  Administered 2020-03-25: 40 mg via INTRAMUSCULAR

## 2020-03-25 NOTE — Telephone Encounter (Signed)
Noted and agree. 

## 2020-03-25 NOTE — Patient Instructions (Signed)
It was great seeing you today.  I have filled out your paperwork for your surgical clearance and [name] is going to pick it up and take it out to your cardiologist office.  She is also going to take care of the paperwork that needs to go to your orthopedic surgeons.  We are collecting lab work today to get a baseline of your hemoglobin as well as your kidney function before your surgery.  We also gave you a knee injection in your left knee today.  The pain should improve tonight but may be a little bit worse tomorrow but then should steadily improve.  If you have any issues, questions or concerns please feel free to call the clinic.  I hope you have a wonderful afternoon!

## 2020-03-25 NOTE — Telephone Encounter (Signed)
Clearance letter written for patient.  I will be seeing this patient today so we will determine if we need to send it in for him or if he will take it to his orthopedist.

## 2020-03-25 NOTE — Telephone Encounter (Signed)
Attempted call to assess progress toward tobacco intake reduction around upcoming surgery (04/08/2020) on his back.   Patient reports continued use of 10 cigarettes per day.   Patient encouraged to cut back prior to surgery.  Patient has patches, gum, lozenges.

## 2020-03-25 NOTE — Progress Notes (Signed)
    SUBJECTIVE:   CHIEF COMPLAINT / HPI:   Knee pain  Patient reports continuation of knee pain and has left knee.  Would like an injection in this knee today.  Has history of osteoarthritis in this knee and reports he feels great relief with the injections.  No swelling or erythema.  Reports that he has not checked his blood sugars since being seen last in having his knee injection but that he feels like he has been doing well although he does acknowledge he was extremely thirsty today.  Surgical Clearance Patient is having spinal surgery on February 15 need surgical clearance from [place].  He also needs cardiac clearance from his cardiologist.  Paperwork provided and sections needing to be completed by me were completed.  Patient's home health nurse [name] is going to pick up the paperwork tomorrow and take it to patient's cardiologist.  She is also going to fax the need for I home health aide form to the patient's surgeon.    OBJECTIVE:   BP 112/72   Pulse 89   Wt (!) 310 lb (140.6 kg)   SpO2 98%   BMI 43.85 kg/m   Well-appearing 60 year old male in no acute distress Cardiac: Regular rate and rhythm no murmurs appreciated Respiratory: Normal breathing MSK: Crepitus noted in left knee, no edema or erythema noted, full range of motion.  After informed written consent timeout was performed, patient was lying supine on exam table. Utilizing superolateral approach,  knee was then injected with 3:1 lidocaine:depomedrol.  Patient tolerated procedure well without immediate complications  ASSESSMENT/PLAN:   Chronic knee pain Patient has history of chronic bilateral knee pain.  Right knee pain has improved but is still present, pain wishes to have injection left knee today.  Injection was provided in left knee.  Patient requested follow-up after he has a spine surgery, this may be a while.  Strict return precautions given.     [name], MD [place]

## 2020-03-25 NOTE — Telephone Encounter (Signed)
-----   Message from Leavy Cella, [employer] sent at 03/04/2020  9:34 AM EST ----- Regarding: Tobacco Cessation

## 2020-03-25 NOTE — Assessment & Plan Note (Signed)
Patient has history of chronic bilateral knee pain.  Right knee pain has improved but is still present, pain wishes to have injection left knee today.  Injection was provided in left knee.  Patient requested follow-up after he has a spine surgery, this may be a while.  Strict return precautions given.

## 2020-03-26 LAB — CBC
Hematocrit: 40.5 % (ref 37.5–51.0)
Hemoglobin: 13.5 g/dL (ref 13.0–17.7)
MCH: 29.4 pg (ref 26.6–33.0)
MCHC: 33.3 g/dL (ref 31.5–35.7)
MCV: 88 fL (ref 79–97)
Platelets: 254 10*3/uL (ref 150–450)
RBC: 4.59 x10E6/uL (ref 4.14–5.80)
RDW: 15.3 % (ref 11.6–15.4)
WBC: 10 10*3/uL (ref 3.4–10.8)

## 2020-03-26 LAB — BASIC METABOLIC PANEL
BUN/Creatinine Ratio: 14 (ref 10–24)
BUN: 25 mg/dL (ref 8–27)
CO2: 25 mmol/L (ref 20–29)
Calcium: 9 mg/dL (ref 8.6–10.2)
Chloride: 102 mmol/L (ref 96–106)
Creatinine, Ser: 1.75 mg/dL — ABNORMAL HIGH (ref 0.76–1.27)
GFR calc Af Amer: 48 mL/min/{1.73_m2} — ABNORMAL LOW (ref >59)
GFR calc non Af Amer: 41 mL/min/{1.73_m2} — ABNORMAL LOW (ref >59)
Glucose: 94 mg/dL (ref 65–99)
Potassium: 4.6 mmol/L (ref 3.5–5.2)
Sodium: 139 mmol/L (ref 134–144)

## 2020-03-27 ENCOUNTER — Telehealth (HOSPITAL_COMMUNITY): Payer: Self-pay

## 2020-03-27 ENCOUNTER — Telehealth (HOSPITAL_COMMUNITY): Payer: Self-pay | Admitting: Licensed Clinical Social Worker

## 2020-03-27 NOTE — Telephone Encounter (Signed)
Community paramedic brought in [facility] application for patient to help him get aid services at home following surgery and a rehab stay.  Form completed and signed by APP- faxed to [facility] for review  Will continue to follow and assist as needed  Jorge Ny, [facility] Desk#: [phone] Cell#: [phone]

## 2020-03-27 NOTE — Telephone Encounter (Signed)
Spoke to [name] and confirmed paperwork was picked up from Dr. Hulen Luster office and given to LCSW at [facility] for providers to sign off on [facility] services as well as Cardiac Clearance for his spinal surgery. Paperwork faxed to proper address. I will follow up with patient on Monday.

## 2020-03-31 ENCOUNTER — Other Ambulatory Visit (HOSPITAL_COMMUNITY): Payer: Self-pay

## 2020-03-31 NOTE — Progress Notes (Signed)
Paramedicine Encounter    Patient ID: Russell Collier, male    DOB: 05-20-1960, 60 y.o.   MRN: [id number]   Patient Care Team: [name], MD as PCP - General (Family Medicine) [name], MD as PCP - Cardiology (Cardiology) Michael Boston, MD as Consulting Physician (General Surgery) Wilford Corner, MD as Consulting Physician (Gastroenterology) Jorge Ny, LCSW as Social Worker (Licensed Clinical Social Worker) Alda Berthold, DO as Consulting Physician (Neurology) Jola Baptist, [place] as Referring Physician (Chiropractic Medicine)  Patient Active Problem List   Diagnosis Date Noted  . 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 COPD exacerbation ([place]) 03/20/2020  . Type 2 diabetes mellitus with diabetic peripheral angiopathy without gangrene, without long-term current use of insulin ([name]) 03/20/2020  . Coagulation defect ([place]) 12/11/2019  . Fatigue 12/10/2019  . Leg cramps 12/10/2019  . Mild epistaxis 10/30/2019  . Disc degeneration, 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  . Ganglion of right knee 09/27/2019  . Lumbar radiculopathy 09/27/2019  . Lower extremity edema   . 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 Acute respiratory failure ([place]) 07/27/2019  . Primary osteoarthritis of right knee 07/24/2019  . Breast tenderness in male 05/31/2019  . Chronic pain 05/31/2019  . AKI (acute kidney injury) ([place]) 05/31/2019  . Pain due to onychomycosis of toenails of both feet 05/16/2019  . Chronic knee pain 04/16/2019  . Diabetes ([place]) 04/16/2019  . Esophageal reflux 02/06/2019  . Heartburn 02/06/2019  . Chronic skin ulcer of lower leg ([place]) 12/21/2018  . S/P drug eluting coronary stent placement   . Coronary artery disease involving native coronary artery of native heart with unstable angina pectoris ([place]) 12/20/2018  . Unstable angina ([place])   . 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 Acute on chronic diastolic heart failure ([place])   . Shortness of breath   . Precordial chest pain   . Idiopathic  peripheral neuropathy   . Palpitations   . Chronic diastolic heart failure ([place])   . Abnormal ankle brachial index (ABI)   . Chest pain, rule out acute myocardial infarction 09/26/2018  . History of cocaine abuse ([place]) 08/20/2018  . Marijuana use 08/20/2018  . Morbid obesity with BMI of 40.0-44.9, adult ([place]) 08/20/2018  . Dyspepsia   . Morbid obesity ([place])   . OSA on CPAP   . Chest pain 05/30/2018  . CAD (coronary artery disease) 03/30/2018  . Elevated troponin 02/03/2018  . Positive urine drug screen 02/03/2018  . Tobacco abuse 02/03/2018  . Hyperlipidemia 02/03/2018  . Acute respiratory failure with hypoxia ([place]) 02/02/2018  . Acute congestive heart failure ([place])   . Keratoconjunctivitis sicca of left eye not specified as Sjogren's 09/06/2017  . Long term current use of oral hypoglycemic drug 09/06/2017  . Mechanical ectropion of left lower eyelid 09/06/2017  . Nuclear sclerotic cataract of both eyes 09/06/2017  . Refractive amblyopia, left 09/06/2017  . Type 2 diabetes mellitus without complication, without long-term current use of insulin ([place]) 09/06/2017  . Hyperopia of both eyes with astigmatism and presbyopia 09/06/2017  . Atypical chest pain 12/27/2015  . Essential hypertension   . Neuropathy     Current Outpatient Medications:  .  Accu-Chek Softclix Lancets lancets, Use as instructed, Disp: 100 each, Rfl: 12 .  albuterol (PROVENTIL) (2.5 MG/3ML) 0.083% nebulizer solution, Take 2.5 mg by nebulization every 6 (six) hours as needed for wheezing or shortness of breath., Disp: , Rfl:  .  albuterol (VENTOLIN HFA) 108 (90 Base) MCG/ACT inhaler, Inhale 2 puffs into the lungs every 6 (  six) hours as needed for wheezing or shortness of breath., Disp: 18 g, Rfl: 3 .  apixaban (ELIQUIS) 2.5 MG TABS tablet, Take 1 tablet (2.5 mg total) by mouth 2 (two) times daily., Disp: 60 tablet, Rfl: 11 .  atorvastatin (LIPITOR) 80 MG tablet, Take 1 tablet (80 mg total) by mouth daily., Disp: 90  tablet, Rfl: 3 .  baclofen (LIORESAL) 10 MG tablet, Take 5-10 mg by mouth 3 (three) times daily as needed., Disp: , Rfl:  .  Blood Glucose Monitoring Suppl (ACCU-CHEK GUIDE) w/Device KIT, 1 Units by Does not apply route daily., Disp: 1 kit, Rfl: 0 .  dapagliflozin propanediol (FARXIGA) 10 MG TABS tablet, Take 1 tablet (10 mg total) by mouth daily before breakfast., Disp: 30 tablet, Rfl: 6 .  enalapril (VASOTEC) 20 MG tablet, Take 1 tablet (20 mg total) by mouth at bedtime., Disp: 90 tablet, Rfl: 3 .  fluticasone (FLONASE) 50 MCG/ACT nasal spray, Place 1 spray into both nostrils daily., Disp: 16 g, Rfl: 11 .  furosemide (LASIX) 40 MG tablet, Take 1 tablet (40 mg total) by mouth 2 (two) times daily., Disp: 180 tablet, Rfl: 3 .  glucose blood (ACCU-CHEK GUIDE) test strip, Use as instructed, Disp: 100 each, Rfl: 12 .  isosorbide mononitrate (IMDUR) 30 MG 24 hr tablet, Take 1 tablet (30 mg total) by mouth daily., Disp: 90 tablet, Rfl: 3 .  Multiple Vitamin (MULTIVITAMIN WITH MINERALS) TABS tablet, Take 1 tablet by mouth daily., Disp: , Rfl:  .  nicotine (NICODERM CQ - DOSED IN MG/24 HOURS) 21 mg/24hr patch, 21 mg daily. (Patient not taking: Reported on 03/25/2020), Disp: , Rfl:  .  pantoprazole (PROTONIX) 40 MG tablet, Take 1 tablet (40 mg total) by mouth daily., Disp: 90 tablet, Rfl: 0 .  potassium chloride SA (KLOR-CON) 20 MEQ tablet, TAKE 1 TABLET(20 MEQ) BY MOUTH DAILY, Disp: 90 tablet, Rfl: 3 .  pregabalin (LYRICA) 100 MG capsule, TAKE ONE CAPSULE BY MOUTH THREE TIMES DAILY, Disp: 270 capsule, Rfl: 2 .  umeclidinium-vilanterol (ANORO ELLIPTA) 62.5-25 MCG/INH AEPB, Inhale 1 puff into the lungs daily as needed., Disp: 60 each, Rfl: 3 .  valACYclovir (VALTREX) 500 MG tablet, Take 1 tablet (500 mg total) by mouth daily., Disp: 90 tablet, Rfl: 3 .  VYNDAMAX 61 MG CAPS, TAKE 1 CAPSULE BY MOUTH DAILY., Disp: 30 capsule, Rfl: 11  Current Facility-Administered Medications:  .  methylPREDNISolone acetate  (DEPO-MEDROL) injection 40 mg, 40 mg, Intramuscular, Once, [name], MD Allergies  Allergen Reactions  . Varenicline Other (See Comments)    Patient reports laryngospasm stopped in the ER  . Bupropion Other (See Comments)    Headache - moderate/severe - self discontinued agent      Social History   Socioeconomic History  . Marital status: Divorced    Spouse name: Not on file  . Number of children: 2  . Years of education: 10  . Highest education level: Not on file  Occupational History  . Occupation: not employed  Tobacco Use  . Smoking status: Current Every Day Smoker    Packs/day: 0.50    Years: 54.00    Pack years: 27.00    Types: Cigarettes, Cigars  . Smokeless tobacco: Never Used  . Tobacco comment: half pack daily  Vaping Use  . Vaping Use: Never used  Substance and Sexual Activity  . Alcohol use: Yes    Alcohol/week: 4.0 standard drinks    Types: 2 Cans of beer, 2 Shots of liquor per week  .  Drug use: Not Currently    Comment: last use may last year  . Sexual activity: Yes  Other Topics Concern  . Not on file  Social History Narrative   Right handed   Two story home   No caffeine   Social Determinants of Health   Financial Resource Strain: Low Risk   . Difficulty of Paying Living Expenses: Not very hard  Food Insecurity: No Food Insecurity  . Worried About Charity fundraiser in the Last Year: Never true  . Ran Out of Food in the Last Year: Never true  Transportation Needs: No Transportation Needs  . Lack of Transportation (Medical): No  . Lack of Transportation (Non-Medical): No  Physical Activity: Not on file  Stress: Not on file  Social Connections: Not on file  Intimate Partner Violence: Not on file    Physical Exam Vitals reviewed.  Constitutional:      Appearance: Normal appearance. He is normal weight.  HENT:     Head: Normocephalic.     Nose: Nose normal.     Mouth/Throat:     Mouth: Mucous membranes are moist.     Pharynx:  Oropharynx is clear.  Eyes:     Conjunctiva/sclera: Conjunctivae normal.     Pupils: Pupils are equal, round, and reactive to light.  Cardiovascular:     Rate and Rhythm: Normal rate and regular rhythm.     Pulses: Normal pulses.     Heart sounds: Normal heart sounds.  Pulmonary:     Effort: Pulmonary effort is normal.     Breath sounds: Normal breath sounds.  Abdominal:     Palpations: Abdomen is soft.  Musculoskeletal:        General: Normal range of motion.     Cervical back: Normal range of motion.     Comments: Lower back pain  Skin:    General: Skin is warm and dry.     Capillary Refill: Capillary refill takes less than 2 seconds.  Neurological:     General: No focal deficit present.     Mental Status: He is alert. Mental status is at baseline.  Psychiatric:        Mood and Affect: Mood normal.        Behavior: Behavior normal.        Thought Content: Thought content normal.        Judgment: Judgment normal.     Arrived for home visit for Malak who was alert and oriented ambulating in his kitchen reporting he was doing okay aside from his chronic back pain. Mico has lumbar surgery planned for 2/15. Quinton has pre op visit tomorrow and was aware of same. I removed Xarelto from his bubble packs as ordered by his surgeon and instructed pharmacist to leave out Xarelto for first two weeks of upcoming renewal of bubble packs as patient has to be off same for next 3 weeks. We reviewed medications and confirmed same. Vitals obtained. Jedrick and I discussed his plans of care once he returns home as he is hoping to return home after surgery, he will update me with this plan. My plan is to discharge patient once he is settled after his surgery. Our target discharge is beginning of May.  We will evaluate again in two weeks. Jeter agreed with plan. Assessment as noted, no edema, lung sounds clear with no JVD. Weight is 302lbs. Home visit complete I will see patient in two weeks.     Refills: NONE  CBG- 133   - [place] inquiring about cream for eczema. I will follow up.    Future Appointments  Date Time Provider [place]  06/17/2020  3:45 PM Price, Christian Mate, DPM [facility]     ACTION: Home visit completed Next visit planned for two weeks

## 2020-04-04 ENCOUNTER — Telehealth (HOSPITAL_COMMUNITY): Payer: Self-pay | Admitting: Licensed Clinical Social Worker

## 2020-04-04 NOTE — Telephone Encounter (Signed)
CSW called pt to discuss opportunity to work with our Therapist, nutritional to reach health goals.  Pt agreeable to speaking with her regarding smoking cessation.  Anticipate Health coach can reach out to patient next week.  Will continue to follow and assist as needed  Jorge Ny, [facility] Desk#: [phone] Cell#: [phone]

## 2020-04-07 ENCOUNTER — Telehealth (HOSPITAL_COMMUNITY): Payer: Self-pay | Admitting: Licensed Clinical Social Worker

## 2020-04-07 NOTE — Telephone Encounter (Signed)
CSW received message from [facility] requesting we send PCS request form to them.  Per paramedic [facility] denied pt for PCS so now hopeful to get help from insurance.  Form faxed and faxed confirmation received  Will continue to follow and assist as needed  Jorge Ny, [facility] Desk#: [phone] Cell#: [phone]

## 2020-04-08 DIAGNOSIS — M4316 Spondylolisthesis, lumbar region: Secondary | ICD-10-CM | POA: Diagnosis not present

## 2020-04-08 DIAGNOSIS — M48062 Spinal stenosis, lumbar region with neurogenic claudication: Secondary | ICD-10-CM | POA: Diagnosis not present

## 2020-04-08 DIAGNOSIS — M48061 Spinal stenosis, lumbar region without neurogenic claudication: Secondary | ICD-10-CM | POA: Diagnosis not present

## 2020-04-09 DIAGNOSIS — I110 Hypertensive heart disease with heart failure: Secondary | ICD-10-CM | POA: Diagnosis not present

## 2020-04-09 DIAGNOSIS — I2510 Atherosclerotic heart disease of native coronary artery without angina pectoris: Secondary | ICD-10-CM | POA: Diagnosis not present

## 2020-04-09 DIAGNOSIS — Z720 Tobacco use: Secondary | ICD-10-CM | POA: Diagnosis not present

## 2020-04-09 DIAGNOSIS — I5030 Unspecified diastolic (congestive) heart failure: Secondary | ICD-10-CM | POA: Diagnosis not present

## 2020-04-09 DIAGNOSIS — M5116 Intervertebral disc disorders with radiculopathy, lumbar region: Secondary | ICD-10-CM | POA: Diagnosis not present

## 2020-04-09 DIAGNOSIS — N179 Acute kidney failure, unspecified: Secondary | ICD-10-CM | POA: Diagnosis not present

## 2020-04-09 DIAGNOSIS — M4326 Fusion of spine, lumbar region: Secondary | ICD-10-CM | POA: Diagnosis not present

## 2020-04-09 DIAGNOSIS — M4316 Spondylolisthesis, lumbar region: Secondary | ICD-10-CM | POA: Diagnosis not present

## 2020-04-10 ENCOUNTER — Telehealth: Payer: Self-pay | Admitting: Pharmacist

## 2020-04-10 DIAGNOSIS — I110 Hypertensive heart disease with heart failure: Secondary | ICD-10-CM | POA: Diagnosis not present

## 2020-04-10 DIAGNOSIS — Z720 Tobacco use: Secondary | ICD-10-CM | POA: Diagnosis not present

## 2020-04-10 DIAGNOSIS — M4326 Fusion of spine, lumbar region: Secondary | ICD-10-CM | POA: Diagnosis not present

## 2020-04-10 DIAGNOSIS — I5030 Unspecified diastolic (congestive) heart failure: Secondary | ICD-10-CM | POA: Diagnosis not present

## 2020-04-10 DIAGNOSIS — I2510 Atherosclerotic heart disease of native coronary artery without angina pectoris: Secondary | ICD-10-CM | POA: Diagnosis not present

## 2020-04-10 NOTE — Telephone Encounter (Signed)
-----   Message from Leavy Cella, [place] sent at 03/25/2020 11:09 AM EST ----- Regarding: Tobacco Cessation post back surgery on 2/15

## 2020-04-10 NOTE — Telephone Encounter (Signed)
Patient reports completing back surgery as planned on 2/15.    He states he continues to have post-operative pain.    He denies smoking since surgery.  He notes that he will be sent to a rehab unit in 5 days (Monday).  Unsure expected duration of that stay.   Encouraged to work through any urge to smoke with relaxation techniques.  I plan to follow up with him in ~ 1 week.

## 2020-04-11 DIAGNOSIS — I2510 Atherosclerotic heart disease of native coronary artery without angina pectoris: Secondary | ICD-10-CM | POA: Diagnosis not present

## 2020-04-11 DIAGNOSIS — K5900 Constipation, unspecified: Secondary | ICD-10-CM | POA: Diagnosis not present

## 2020-04-11 DIAGNOSIS — M5416 Radiculopathy, lumbar region: Secondary | ICD-10-CM | POA: Diagnosis not present

## 2020-04-11 DIAGNOSIS — I10 Essential (primary) hypertension: Secondary | ICD-10-CM | POA: Diagnosis not present

## 2020-04-12 DIAGNOSIS — I2510 Atherosclerotic heart disease of native coronary artery without angina pectoris: Secondary | ICD-10-CM | POA: Diagnosis not present

## 2020-04-12 DIAGNOSIS — M5416 Radiculopathy, lumbar region: Secondary | ICD-10-CM | POA: Diagnosis not present

## 2020-04-12 DIAGNOSIS — I10 Essential (primary) hypertension: Secondary | ICD-10-CM | POA: Diagnosis not present

## 2020-04-12 DIAGNOSIS — K5900 Constipation, unspecified: Secondary | ICD-10-CM | POA: Diagnosis not present

## 2020-04-13 DIAGNOSIS — M5136 Other intervertebral disc degeneration, lumbar region: Secondary | ICD-10-CM | POA: Diagnosis not present

## 2020-04-13 DIAGNOSIS — M48062 Spinal stenosis, lumbar region with neurogenic claudication: Secondary | ICD-10-CM | POA: Diagnosis not present

## 2020-04-13 DIAGNOSIS — I10 Essential (primary) hypertension: Secondary | ICD-10-CM | POA: Diagnosis not present

## 2020-04-13 DIAGNOSIS — M5416 Radiculopathy, lumbar region: Secondary | ICD-10-CM | POA: Diagnosis not present

## 2020-04-14 DIAGNOSIS — M5416 Radiculopathy, lumbar region: Secondary | ICD-10-CM | POA: Diagnosis not present

## 2020-04-14 DIAGNOSIS — M48062 Spinal stenosis, lumbar region with neurogenic claudication: Secondary | ICD-10-CM | POA: Diagnosis not present

## 2020-04-14 DIAGNOSIS — I10 Essential (primary) hypertension: Secondary | ICD-10-CM | POA: Diagnosis not present

## 2020-04-14 DIAGNOSIS — M5136 Other intervertebral disc degeneration, lumbar region: Secondary | ICD-10-CM | POA: Diagnosis not present

## 2020-04-15 ENCOUNTER — Telehealth (HOSPITAL_COMMUNITY): Payer: Self-pay | Admitting: *Deleted

## 2020-04-15 DIAGNOSIS — E785 Hyperlipidemia, unspecified: Secondary | ICD-10-CM | POA: Diagnosis not present

## 2020-04-15 DIAGNOSIS — I2510 Atherosclerotic heart disease of native coronary artery without angina pectoris: Secondary | ICD-10-CM | POA: Diagnosis not present

## 2020-04-15 DIAGNOSIS — E119 Type 2 diabetes mellitus without complications: Secondary | ICD-10-CM | POA: Diagnosis not present

## 2020-04-15 DIAGNOSIS — I10 Essential (primary) hypertension: Secondary | ICD-10-CM | POA: Diagnosis not present

## 2020-04-15 NOTE — Telephone Encounter (Signed)
Patient was seen by MPH Candidate for Health Coaching.   Health Coach Preceptor agrees with assessment and plan.   Landis Martins, MS, ACSM-CEP, [facility]  04/15/2020 11:38 AM

## 2020-04-15 NOTE — Telephone Encounter (Signed)
Contacted patient on 02/22 for initial health coaching session. However, patient stated he wasn't available due to hospital visit. Will follow-up with patient on 03/01 to give time for recovery. Larose Kells., MPH Candidate [facility] Health Coach Intern

## 2020-04-17 ENCOUNTER — Telehealth: Payer: Self-pay

## 2020-04-17 DIAGNOSIS — I509 Heart failure, unspecified: Secondary | ICD-10-CM | POA: Diagnosis not present

## 2020-04-17 DIAGNOSIS — E785 Hyperlipidemia, unspecified: Secondary | ICD-10-CM | POA: Diagnosis not present

## 2020-04-17 DIAGNOSIS — G4733 Obstructive sleep apnea (adult) (pediatric): Secondary | ICD-10-CM | POA: Diagnosis not present

## 2020-04-17 DIAGNOSIS — I2510 Atherosclerotic heart disease of native coronary artery without angina pectoris: Secondary | ICD-10-CM | POA: Diagnosis not present

## 2020-04-17 DIAGNOSIS — I10 Essential (primary) hypertension: Secondary | ICD-10-CM | POA: Diagnosis not present

## 2020-04-17 DIAGNOSIS — E119 Type 2 diabetes mellitus without complications: Secondary | ICD-10-CM | POA: Diagnosis not present

## 2020-04-17 DIAGNOSIS — J449 Chronic obstructive pulmonary disease, unspecified: Secondary | ICD-10-CM | POA: Diagnosis not present

## 2020-04-17 DIAGNOSIS — K5900 Constipation, unspecified: Secondary | ICD-10-CM | POA: Diagnosis not present

## 2020-04-17 DIAGNOSIS — Z86711 Personal history of pulmonary embolism: Secondary | ICD-10-CM | POA: Diagnosis not present

## 2020-04-17 NOTE — Telephone Encounter (Signed)
Transition Care Management Unsuccessful Follow-up Telephone Call  Date of discharge and from where:  04/16/2020 from [facility].   Attempts:  1st Attempt  Reason for unsuccessful TCM follow-up call:  Left voice message

## 2020-04-18 ENCOUNTER — Telehealth: Payer: Self-pay | Admitting: Pharmacist

## 2020-04-18 DIAGNOSIS — I10 Essential (primary) hypertension: Secondary | ICD-10-CM | POA: Diagnosis not present

## 2020-04-18 DIAGNOSIS — E119 Type 2 diabetes mellitus without complications: Secondary | ICD-10-CM | POA: Diagnosis not present

## 2020-04-18 DIAGNOSIS — J449 Chronic obstructive pulmonary disease, unspecified: Secondary | ICD-10-CM | POA: Diagnosis not present

## 2020-04-18 DIAGNOSIS — G4733 Obstructive sleep apnea (adult) (pediatric): Secondary | ICD-10-CM | POA: Diagnosis not present

## 2020-04-18 NOTE — Telephone Encounter (Signed)
Transition Care Management Unsuccessful Follow-up Telephone Call  Date of discharge and from where:  04/08/2020 from [facility]  Attempts:  3rd Attempt  Reason for unsuccessful TCM follow-up call:  No answer/busy   Spoke to pt and he has been discharged to a Rehab facility.

## 2020-04-18 NOTE — Telephone Encounter (Signed)
Noted and agree. 

## 2020-04-18 NOTE — Telephone Encounter (Signed)
Contacted patient via his cell phone while he remains in physical therapy (rehab) post back surgery.  He reports that he had an 18 inch incision and he has had bleeding from his surgery site.   He states he has NOT been smoking.  He claims to only have 10 cigarettes in his possession at this time.    We discussed him discarding/donating his remaining cigarettes and becoming an Ex-smoker.   He was in moderate level of agreement with that potential approach.    He states he is able to stand with a walker at this time.  However, he does NOT have a plan to transition to home at this time.   We agreed on a phone follow-up in 7-10 days. I encouraged continued complete abstinence until next follow-up.  He agreed with this plan.

## 2020-04-21 ENCOUNTER — Telehealth (HOSPITAL_COMMUNITY): Payer: Self-pay

## 2020-04-21 DIAGNOSIS — J449 Chronic obstructive pulmonary disease, unspecified: Secondary | ICD-10-CM | POA: Diagnosis not present

## 2020-04-21 DIAGNOSIS — Z86711 Personal history of pulmonary embolism: Secondary | ICD-10-CM | POA: Diagnosis not present

## 2020-04-21 DIAGNOSIS — G4733 Obstructive sleep apnea (adult) (pediatric): Secondary | ICD-10-CM | POA: Diagnosis not present

## 2020-04-21 DIAGNOSIS — I2510 Atherosclerotic heart disease of native coronary artery without angina pectoris: Secondary | ICD-10-CM | POA: Diagnosis not present

## 2020-04-21 DIAGNOSIS — I509 Heart failure, unspecified: Secondary | ICD-10-CM | POA: Diagnosis not present

## 2020-04-21 DIAGNOSIS — I10 Essential (primary) hypertension: Secondary | ICD-10-CM | POA: Diagnosis not present

## 2020-04-21 DIAGNOSIS — Z720 Tobacco use: Secondary | ICD-10-CM | POA: Diagnosis not present

## 2020-04-21 DIAGNOSIS — E119 Type 2 diabetes mellitus without complications: Secondary | ICD-10-CM | POA: Diagnosis not present

## 2020-04-21 DIAGNOSIS — E785 Hyperlipidemia, unspecified: Secondary | ICD-10-CM | POA: Diagnosis not present

## 2020-04-21 DIAGNOSIS — M79671 Pain in right foot: Secondary | ICD-10-CM | POA: Diagnosis not present

## 2020-04-21 MED FILL — VYNDAMAX 61 MG CAPS: 61 | 30 days supply | Qty: 30 | Fill #4

## 2020-04-21 NOTE — Telephone Encounter (Signed)
Russell Collier called me informing me he was in need of his Vyndamax at the assisted living center he is currently at. He reports he is at [facility] in [place]. I spoke to speciality pharmacy and they are mailing same to patient at assisted living center, patient should receive same by Weds. Patient updated and call complete.

## 2020-04-22 DIAGNOSIS — J449 Chronic obstructive pulmonary disease, unspecified: Secondary | ICD-10-CM | POA: Diagnosis not present

## 2020-04-22 DIAGNOSIS — E119 Type 2 diabetes mellitus without complications: Secondary | ICD-10-CM | POA: Diagnosis not present

## 2020-04-22 DIAGNOSIS — M79671 Pain in right foot: Secondary | ICD-10-CM | POA: Diagnosis not present

## 2020-04-22 DIAGNOSIS — R52 Pain, unspecified: Secondary | ICD-10-CM | POA: Diagnosis not present

## 2020-04-22 DIAGNOSIS — Z720 Tobacco use: Secondary | ICD-10-CM | POA: Diagnosis not present

## 2020-04-22 DIAGNOSIS — R339 Retention of urine, unspecified: Secondary | ICD-10-CM | POA: Diagnosis not present

## 2020-04-22 DIAGNOSIS — Z20822 Contact with and (suspected) exposure to covid-19: Secondary | ICD-10-CM | POA: Diagnosis not present

## 2020-04-22 DIAGNOSIS — I509 Heart failure, unspecified: Secondary | ICD-10-CM | POA: Diagnosis not present

## 2020-04-22 DIAGNOSIS — Z86711 Personal history of pulmonary embolism: Secondary | ICD-10-CM | POA: Diagnosis not present

## 2020-04-22 DIAGNOSIS — R509 Fever, unspecified: Secondary | ICD-10-CM | POA: Diagnosis not present

## 2020-04-22 DIAGNOSIS — G4733 Obstructive sleep apnea (adult) (pediatric): Secondary | ICD-10-CM | POA: Diagnosis not present

## 2020-04-22 DIAGNOSIS — E785 Hyperlipidemia, unspecified: Secondary | ICD-10-CM | POA: Diagnosis not present

## 2020-04-22 DIAGNOSIS — W19XXXA Unspecified fall, initial encounter: Secondary | ICD-10-CM | POA: Diagnosis not present

## 2020-04-22 DIAGNOSIS — I10 Essential (primary) hypertension: Secondary | ICD-10-CM | POA: Diagnosis not present

## 2020-04-22 DIAGNOSIS — M545 Low back pain, unspecified: Secondary | ICD-10-CM | POA: Diagnosis not present

## 2020-04-22 DIAGNOSIS — R2689 Other abnormalities of gait and mobility: Secondary | ICD-10-CM | POA: Diagnosis not present

## 2020-04-22 DIAGNOSIS — I2510 Atherosclerotic heart disease of native coronary artery without angina pectoris: Secondary | ICD-10-CM | POA: Diagnosis not present

## 2020-04-22 DIAGNOSIS — M549 Dorsalgia, unspecified: Secondary | ICD-10-CM | POA: Diagnosis not present

## 2020-04-23 DIAGNOSIS — M79671 Pain in right foot: Secondary | ICD-10-CM | POA: Diagnosis not present

## 2020-04-23 DIAGNOSIS — M545 Low back pain, unspecified: Secondary | ICD-10-CM | POA: Insufficient documentation

## 2020-04-23 DIAGNOSIS — Z789 Other specified health status: Secondary | ICD-10-CM | POA: Insufficient documentation

## 2020-04-24 DIAGNOSIS — E119 Type 2 diabetes mellitus without complications: Secondary | ICD-10-CM | POA: Diagnosis not present

## 2020-04-24 DIAGNOSIS — Z789 Other specified health status: Secondary | ICD-10-CM | POA: Diagnosis not present

## 2020-04-24 DIAGNOSIS — M545 Low back pain, unspecified: Secondary | ICD-10-CM | POA: Diagnosis not present

## 2020-04-24 DIAGNOSIS — R41841 Cognitive communication deficit: Secondary | ICD-10-CM | POA: Diagnosis not present

## 2020-04-24 DIAGNOSIS — R2689 Other abnormalities of gait and mobility: Secondary | ICD-10-CM | POA: Diagnosis not present

## 2020-04-24 DIAGNOSIS — Z7409 Other reduced mobility: Secondary | ICD-10-CM | POA: Diagnosis not present

## 2020-04-24 DIAGNOSIS — M5459 Other low back pain: Secondary | ICD-10-CM | POA: Diagnosis not present

## 2020-04-25 DIAGNOSIS — I10 Essential (primary) hypertension: Secondary | ICD-10-CM | POA: Diagnosis not present

## 2020-04-25 DIAGNOSIS — M48061 Spinal stenosis, lumbar region without neurogenic claudication: Secondary | ICD-10-CM | POA: Diagnosis not present

## 2020-04-25 DIAGNOSIS — M4326 Fusion of spine, lumbar region: Secondary | ICD-10-CM | POA: Diagnosis not present

## 2020-04-25 DIAGNOSIS — I2510 Atherosclerotic heart disease of native coronary artery without angina pectoris: Secondary | ICD-10-CM | POA: Diagnosis not present

## 2020-04-25 DIAGNOSIS — G4733 Obstructive sleep apnea (adult) (pediatric): Secondary | ICD-10-CM | POA: Diagnosis not present

## 2020-04-25 DIAGNOSIS — J449 Chronic obstructive pulmonary disease, unspecified: Secondary | ICD-10-CM | POA: Diagnosis not present

## 2020-04-25 DIAGNOSIS — M109 Gout, unspecified: Secondary | ICD-10-CM | POA: Diagnosis not present

## 2020-04-29 ENCOUNTER — Telehealth (HOSPITAL_COMMUNITY): Payer: Self-pay

## 2020-04-29 ENCOUNTER — Telehealth: Payer: Self-pay | Admitting: Pharmacist

## 2020-04-29 NOTE — Telephone Encounter (Signed)
Contacted patient RE tobacco cessation.   Patient reports smoking 1-2 cigarettes per day.  Discussed goal of at least two days in the next week - not smoking at all.   He reports improved breathing and feels like he is wheezing less.   Plan to follow-up in 1 week - set quit date at that time.

## 2020-04-29 NOTE — Telephone Encounter (Signed)
-----   Message from Leavy Cella, [place] sent at 04/18/2020 12:06 PM EST ----- Regarding: Tobacco - Quit?

## 2020-04-29 NOTE — Telephone Encounter (Signed)
Noted and agree. 

## 2020-04-29 NOTE — Telephone Encounter (Signed)
Russell Collier called and reported he is currently placed in [facility] and will advise when he is going back home. Call complete.

## 2020-05-05 ENCOUNTER — Telehealth: Payer: Self-pay | Admitting: Pharmacist

## 2020-05-05 NOTE — Telephone Encounter (Signed)
Contacted patient RE tobacco reduction / cessation.   He reported that he has increased to 3-4 cigarettes per day.  He states his visitors/friends have been the stimulus for increased use.    He plans to reduce back to 2 or less per day.  We also agreed that he should try to completely abstain for at least 2 days over the next week.   I plan to call again in 2 weeks.

## 2020-05-05 NOTE — Telephone Encounter (Signed)
-----   Message from Leavy Cella, [place] sent at 04/29/2020 10:28 AM EST ----- Regarding: Tobacco Cessation - Quit date? OFF therapy - time to quit completely - eliminate supply.

## 2020-05-06 DIAGNOSIS — R338 Other retention of urine: Secondary | ICD-10-CM | POA: Diagnosis not present

## 2020-05-06 DIAGNOSIS — N9989 Other postprocedural complications and disorders of genitourinary system: Secondary | ICD-10-CM | POA: Diagnosis not present

## 2020-05-10 DIAGNOSIS — J449 Chronic obstructive pulmonary disease, unspecified: Secondary | ICD-10-CM | POA: Diagnosis not present

## 2020-05-10 DIAGNOSIS — I2510 Atherosclerotic heart disease of native coronary artery without angina pectoris: Secondary | ICD-10-CM | POA: Diagnosis not present

## 2020-05-10 DIAGNOSIS — I10 Essential (primary) hypertension: Secondary | ICD-10-CM | POA: Diagnosis not present

## 2020-05-10 DIAGNOSIS — M4326 Fusion of spine, lumbar region: Secondary | ICD-10-CM | POA: Diagnosis not present

## 2020-05-10 DIAGNOSIS — G4733 Obstructive sleep apnea (adult) (pediatric): Secondary | ICD-10-CM | POA: Diagnosis not present

## 2020-05-10 DIAGNOSIS — M109 Gout, unspecified: Secondary | ICD-10-CM | POA: Diagnosis not present

## 2020-05-10 DIAGNOSIS — M48061 Spinal stenosis, lumbar region without neurogenic claudication: Secondary | ICD-10-CM | POA: Diagnosis not present

## 2020-05-13 ENCOUNTER — Telehealth: Payer: Self-pay | Admitting: Pharmacist

## 2020-05-13 NOTE — Telephone Encounter (Signed)
Noted and agree. 

## 2020-05-13 NOTE — Telephone Encounter (Signed)
-----   Message from Leavy Cella, [place] sent at 05/05/2020 11:25 AM EDT ----- Regarding: Tobacco intake reduction - Quit x 2 days?

## 2020-05-13 NOTE — Telephone Encounter (Signed)
Phone call to patient RE tobacco cessation / intake reduction.   Patient reports 1 day of complete cessation followed by stressful rehab and increased use to ~ 5 per day.   Reports continued interest in quitting completely.  Goal for the upcoming week is reduction from 5 to 2 cigs per day.  States he will try to reach 2 per day goal.   Note:  Weight reduction has been from 340+ to 277lbs currently.  Encouraged continued efforts for weight loss.   Plan follow-up in 1 week to reassess goal and set quit date.

## 2020-05-14 MED FILL — VYNDAMAX 61 MG CAPS: 61 | 30 days supply | Qty: 30 | Fill #5

## 2020-05-21 DIAGNOSIS — R339 Retention of urine, unspecified: Secondary | ICD-10-CM | POA: Diagnosis not present

## 2020-05-21 DIAGNOSIS — M4316 Spondylolisthesis, lumbar region: Secondary | ICD-10-CM | POA: Diagnosis not present

## 2020-05-21 DIAGNOSIS — Z7984 Long term (current) use of oral hypoglycemic drugs: Secondary | ICD-10-CM | POA: Diagnosis not present

## 2020-05-21 DIAGNOSIS — E119 Type 2 diabetes mellitus without complications: Secondary | ICD-10-CM | POA: Diagnosis not present

## 2020-05-21 DIAGNOSIS — I2510 Atherosclerotic heart disease of native coronary artery without angina pectoris: Secondary | ICD-10-CM | POA: Diagnosis not present

## 2020-05-22 ENCOUNTER — Telehealth: Payer: Self-pay | Admitting: Pharmacist

## 2020-05-22 ENCOUNTER — Other Ambulatory Visit (HOSPITAL_COMMUNITY): Payer: Self-pay

## 2020-05-22 NOTE — Telephone Encounter (Signed)
-----   Message from Leavy Cella, [place] sent at 05/13/2020 10:44 AM EDT ----- Regarding: Tobacco Cessation  - 2 per day goal

## 2020-05-22 NOTE — Telephone Encounter (Signed)
Contacted patient RE tobacco reduction.   Patient reports continued smoking of 4-6 cigs per day.   He has made progress with rehab and reports the facility is considering his discharge date.   Follow-up in 2 weeks planned.

## 2020-05-23 DIAGNOSIS — R41841 Cognitive communication deficit: Secondary | ICD-10-CM | POA: Diagnosis not present

## 2020-05-23 DIAGNOSIS — Z7409 Other reduced mobility: Secondary | ICD-10-CM | POA: Diagnosis not present

## 2020-05-23 DIAGNOSIS — R2689 Other abnormalities of gait and mobility: Secondary | ICD-10-CM | POA: Diagnosis not present

## 2020-05-23 DIAGNOSIS — M5459 Other low back pain: Secondary | ICD-10-CM | POA: Diagnosis not present

## 2020-05-23 DIAGNOSIS — Z789 Other specified health status: Secondary | ICD-10-CM | POA: Diagnosis not present

## 2020-05-23 DIAGNOSIS — E119 Type 2 diabetes mellitus without complications: Secondary | ICD-10-CM | POA: Diagnosis not present

## 2020-05-23 DIAGNOSIS — M545 Low back pain, unspecified: Secondary | ICD-10-CM | POA: Diagnosis not present

## 2020-05-23 NOTE — Telephone Encounter (Signed)
Noted and agree. 

## 2020-05-26 ENCOUNTER — Telehealth (HOSPITAL_COMMUNITY): Payer: Self-pay

## 2020-05-26 NOTE — Telephone Encounter (Signed)
Spoke to [name] who reports he is doing well and is improving in therapy at the nursing facility. [name] was inquiring about his upcoming appointments. I sent him the details of those via text. [name] states he has 4 more weeks in the nursing facility and will keep me updated on discharge home. Call complete.

## 2020-05-28 DIAGNOSIS — M109 Gout, unspecified: Secondary | ICD-10-CM | POA: Diagnosis not present

## 2020-05-28 DIAGNOSIS — M4316 Spondylolisthesis, lumbar region: Secondary | ICD-10-CM | POA: Diagnosis not present

## 2020-05-28 DIAGNOSIS — I10 Essential (primary) hypertension: Secondary | ICD-10-CM | POA: Diagnosis not present

## 2020-05-28 DIAGNOSIS — Z720 Tobacco use: Secondary | ICD-10-CM | POA: Diagnosis not present

## 2020-05-28 DIAGNOSIS — R339 Retention of urine, unspecified: Secondary | ICD-10-CM | POA: Diagnosis not present

## 2020-05-29 DIAGNOSIS — M109 Gout, unspecified: Secondary | ICD-10-CM | POA: Diagnosis not present

## 2020-05-29 DIAGNOSIS — M5136 Other intervertebral disc degeneration, lumbar region: Secondary | ICD-10-CM | POA: Diagnosis not present

## 2020-05-29 DIAGNOSIS — I2510 Atherosclerotic heart disease of native coronary artery without angina pectoris: Secondary | ICD-10-CM | POA: Diagnosis not present

## 2020-05-29 DIAGNOSIS — Z720 Tobacco use: Secondary | ICD-10-CM | POA: Diagnosis not present

## 2020-05-30 DIAGNOSIS — I2510 Atherosclerotic heart disease of native coronary artery without angina pectoris: Secondary | ICD-10-CM | POA: Diagnosis not present

## 2020-05-30 DIAGNOSIS — I10 Essential (primary) hypertension: Secondary | ICD-10-CM | POA: Diagnosis not present

## 2020-05-30 DIAGNOSIS — E119 Type 2 diabetes mellitus without complications: Secondary | ICD-10-CM | POA: Diagnosis not present

## 2020-06-02 DIAGNOSIS — I10 Essential (primary) hypertension: Secondary | ICD-10-CM | POA: Diagnosis not present

## 2020-06-02 DIAGNOSIS — M5136 Other intervertebral disc degeneration, lumbar region: Secondary | ICD-10-CM | POA: Diagnosis not present

## 2020-06-02 DIAGNOSIS — I2510 Atherosclerotic heart disease of native coronary artery without angina pectoris: Secondary | ICD-10-CM | POA: Diagnosis not present

## 2020-06-02 DIAGNOSIS — M109 Gout, unspecified: Secondary | ICD-10-CM | POA: Diagnosis not present

## 2020-06-05 ENCOUNTER — Telehealth: Payer: Self-pay | Admitting: Pharmacist

## 2020-06-05 NOTE — Telephone Encounter (Signed)
Phone call follow-up for tobacco intake assessment.   Patient continues to smoke ~ 6 cigarettes per day.   Patient verbalized he is "unsure if he will ever quit".  He anticipates transition back to his home living situations in ~ 1 week.   I will reassess progress in two weeks - If patient remains pre-contemplative RE tobacco cessation, I will likely discontinue follow-up for tobacco cessation.

## 2020-06-05 NOTE — Telephone Encounter (Signed)
-----   Message from Leavy Cella, [place] sent at 05/22/2020  4:01 PM EDT ----- Regarding: Tobacco Cessation

## 2020-06-06 DIAGNOSIS — Z981 Arthrodesis status: Secondary | ICD-10-CM | POA: Diagnosis not present

## 2020-06-06 DIAGNOSIS — M5136 Other intervertebral disc degeneration, lumbar region: Secondary | ICD-10-CM | POA: Diagnosis not present

## 2020-06-09 ENCOUNTER — Other Ambulatory Visit (HOSPITAL_COMMUNITY): Payer: Self-pay

## 2020-06-11 ENCOUNTER — Other Ambulatory Visit (HOSPITAL_COMMUNITY): Payer: Self-pay

## 2020-06-11 MED FILL — Tafamidis Cap 61 MG: ORAL | 30 days supply | Qty: 30 | Fill #0 | Status: AC

## 2020-06-12 ENCOUNTER — Other Ambulatory Visit (HOSPITAL_COMMUNITY): Payer: Self-pay

## 2020-06-12 DIAGNOSIS — E119 Type 2 diabetes mellitus without complications: Secondary | ICD-10-CM | POA: Diagnosis not present

## 2020-06-12 DIAGNOSIS — Z13220 Encounter for screening for lipoid disorders: Secondary | ICD-10-CM | POA: Diagnosis not present

## 2020-06-12 DIAGNOSIS — D649 Anemia, unspecified: Secondary | ICD-10-CM | POA: Diagnosis not present

## 2020-06-12 DIAGNOSIS — Z131 Encounter for screening for diabetes mellitus: Secondary | ICD-10-CM | POA: Diagnosis not present

## 2020-06-13 ENCOUNTER — Telehealth (HOSPITAL_COMMUNITY): Payer: Self-pay

## 2020-06-13 NOTE — Telephone Encounter (Signed)
Spoke to Mr. Simonich who reports he is home now and is requiring a home visit for medication verification. I will see him in the home on Monday 4/25.

## 2020-06-16 ENCOUNTER — Other Ambulatory Visit (HOSPITAL_COMMUNITY): Payer: Self-pay

## 2020-06-16 NOTE — Progress Notes (Signed)
Paramedicine Encounter    Patient ID: Russell Collier, male    DOB: May 28, 1960, 60 y.o.   MRN: [id number]   Patient Care Team: [name], MD as PCP - General (Family Medicine) [name], MD as PCP - Cardiology (Cardiology) Michael Boston, MD as Consulting Physician (General Surgery) Wilford Corner, MD as Consulting Physician (Gastroenterology) Jorge Ny, LCSW as Social Worker (Licensed Clinical Social Worker) Alda Berthold, DO as Consulting Physician (Neurology) Jola Baptist, [place] as Referring Physician (Chiropractic Medicine)  Patient Active Problem List   Diagnosis Date Noted  . 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 COPD exacerbation ([place]) 03/20/2020  . Type 2 diabetes mellitus with diabetic peripheral angiopathy without gangrene, without long-term current use of insulin ([place]) 03/20/2020  . Coagulation defect ([place]) 12/11/2019  . Fatigue 12/10/2019  . Leg cramps 12/10/2019  . Mild epistaxis 10/30/2019  . Disc degeneration, 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  . Ganglion of right knee 09/27/2019  . Lumbar radiculopathy 09/27/2019  . Lower extremity edema   . 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 Acute respiratory failure ([place]) 07/27/2019  . Primary osteoarthritis of right knee 07/24/2019  . Breast tenderness in male 05/31/2019  . Chronic pain 05/31/2019  . AKI (acute kidney injury) ([place]) 05/31/2019  . Pain due to onychomycosis of toenails of both feet 05/16/2019  . Chronic knee pain 04/16/2019  . Diabetes ([place]) 04/16/2019  . Esophageal reflux 02/06/2019  . Heartburn 02/06/2019  . Chronic skin ulcer of lower leg ([place]) 12/21/2018  . S/P drug eluting coronary stent placement   . Coronary artery disease involving native coronary artery of native heart with unstable angina pectoris ([place]) 12/20/2018  . Unstable angina ([place])   . 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 Acute on chronic diastolic heart failure ([place])   . Shortness of breath   . Precordial chest pain   . Idiopathic  peripheral neuropathy   . Palpitations   . Chronic diastolic heart failure ([place])   . Abnormal ankle brachial index (ABI)   . Chest pain, rule out acute myocardial infarction 09/26/2018  . History of cocaine abuse ([place]) 08/20/2018  . Marijuana use 08/20/2018  . Morbid obesity with BMI of 40.0-44.9, adult ([place]) 08/20/2018  . Dyspepsia   . Morbid obesity ([place])   . OSA on CPAP   . Chest pain 05/30/2018  . CAD (coronary artery disease) 03/30/2018  . Elevated troponin 02/03/2018  . Positive urine drug screen 02/03/2018  . Tobacco abuse 02/03/2018  . Hyperlipidemia 02/03/2018  . Acute respiratory failure with hypoxia ([place]) 02/02/2018  . Acute congestive heart failure ([place])   . Keratoconjunctivitis sicca of left eye not specified as Sjogren's 09/06/2017  . Long term current use of oral hypoglycemic drug 09/06/2017  . Mechanical ectropion of left lower eyelid 09/06/2017  . Nuclear sclerotic cataract of both eyes 09/06/2017  . Refractive amblyopia, left 09/06/2017  . Type 2 diabetes mellitus without complication, without long-term current use of insulin ([place]) 09/06/2017  . Hyperopia of both eyes with astigmatism and presbyopia 09/06/2017  . Atypical chest pain 12/27/2015  . Essential hypertension   . Neuropathy     Current Outpatient Medications:  .  Accu-Chek Softclix Lancets lancets, Use as instructed, Disp: 100 each, Rfl: 12 .  albuterol (PROVENTIL) (2.5 MG/3ML) 0.083% nebulizer solution, Take 2.5 mg by nebulization every 6 (six) hours as needed for wheezing or shortness of breath., Disp: , Rfl:  .  albuterol (VENTOLIN HFA) 108 (90 Base) MCG/ACT inhaler, Inhale 2 puffs into the lungs every 6 (  six) hours as needed for wheezing or shortness of breath., Disp: 18 g, Rfl: 3 .  apixaban (ELIQUIS) 2.5 MG TABS tablet, Take 1 tablet (2.5 mg total) by mouth 2 (two) times daily., Disp: 60 tablet, Rfl: 11 .  atorvastatin (LIPITOR) 80 MG tablet, Take 1 tablet (80 mg total) by mouth daily., Disp: 90  tablet, Rfl: 3 .  baclofen (LIORESAL) 10 MG tablet, Take 5-10 mg by mouth 3 (three) times daily as needed. (Patient not taking: Reported on 03/31/2020), Disp: , Rfl:  .  Blood Glucose Monitoring Suppl (ACCU-CHEK GUIDE) w/Device KIT, 1 Units by Does not apply route daily., Disp: 1 kit, Rfl: 0 .  dapagliflozin propanediol (FARXIGA) 10 MG TABS tablet, Take 1 tablet (10 mg total) by mouth daily before breakfast., Disp: 30 tablet, Rfl: 6 .  enalapril (VASOTEC) 20 MG tablet, Take 1 tablet (20 mg total) by mouth at bedtime., Disp: 90 tablet, Rfl: 3 .  fluticasone (FLONASE) 50 MCG/ACT nasal spray, Place 1 spray into both nostrils daily., Disp: 16 g, Rfl: 11 .  furosemide (LASIX) 40 MG tablet, Take 1 tablet (40 mg total) by mouth 2 (two) times daily., Disp: 180 tablet, Rfl: 3 .  glucose blood (ACCU-CHEK GUIDE) test strip, Use as instructed, Disp: 100 each, Rfl: 12 .  isosorbide mononitrate (IMDUR) 30 MG 24 hr tablet, Take 1 tablet (30 mg total) by mouth daily., Disp: 90 tablet, Rfl: 3 .  Multiple Vitamin (MULTIVITAMIN WITH MINERALS) TABS tablet, Take 1 tablet by mouth daily., Disp: , Rfl:  .  pantoprazole (PROTONIX) 40 MG tablet, Take 1 tablet (40 mg total) by mouth daily., Disp: 90 tablet, Rfl: 0 .  potassium chloride SA (KLOR-CON) 20 MEQ tablet, TAKE 1 TABLET(20 MEQ) BY MOUTH DAILY, Disp: 90 tablet, Rfl: 3 .  pregabalin (LYRICA) 100 MG capsule, TAKE ONE CAPSULE BY MOUTH THREE TIMES DAILY, Disp: 270 capsule, Rfl: 2 .  Tafamidis 61 MG CAPS, TAKE 1 CAPSULE BY MOUTH DAILY., Disp: 30 capsule, Rfl: 11 .  umeclidinium-vilanterol (ANORO ELLIPTA) 62.5-25 MCG/INH AEPB, Inhale 1 puff into the lungs daily as needed., Disp: 60 each, Rfl: 3 .  valACYclovir (VALTREX) 500 MG tablet, Take 1 tablet (500 mg total) by mouth daily., Disp: 90 tablet, Rfl: 3  Current Facility-Administered Medications:  .  methylPREDNISolone acetate (DEPO-MEDROL) injection 40 mg, 40 mg, Intramuscular, Once, [name], MD Allergies   Allergen Reactions  . Varenicline Other (See Comments)    Patient reports laryngospasm stopped in the ER  . Bupropion Other (See Comments)    Headache - moderate/severe - self discontinued agent      Social History   Socioeconomic History  . Marital status: Divorced    Spouse name: Not on file  . Number of children: 2  . Years of education: 10  . Highest education level: Not on file  Occupational History  . Occupation: not employed  Tobacco Use  . Smoking status: Current Every Day Smoker    Packs/day: 0.50    Years: 54.00    Pack years: 27.00    Types: Cigarettes, Cigars  . Smokeless tobacco: Never Used  . Tobacco comment: half pack daily  Vaping Use  . Vaping Use: Never used  Substance and Sexual Activity  . Alcohol use: Yes    Alcohol/week: 4.0 standard drinks    Types: 2 Cans of beer, 2 Shots of liquor per week  . Drug use: Not Currently    Comment: last use may last year  . Sexual activity: Yes  Other Topics Concern  . Not on file  Social History Narrative   Right handed   Two story home   No caffeine   Social Determinants of Health   Financial Resource Strain: Not on file  Food Insecurity: Not on file  Transportation Needs: Not on file  Physical Activity: Not on file  Stress: Not on file  Social Connections: Not on file  Intimate Partner Violence: Not on file    Physical Exam Vitals reviewed.  Constitutional:      Appearance: Normal appearance. He is normal weight.  HENT:     Head: Normocephalic.     Nose: Nose normal.     Mouth/Throat:     Mouth: Mucous membranes are moist.     Pharynx: Oropharynx is clear.  Eyes:     Conjunctiva/sclera: Conjunctivae normal.     Pupils: Pupils are equal, round, and reactive to light.  Cardiovascular:     Rate and Rhythm: Normal rate and regular rhythm.     Pulses: Normal pulses.     Heart sounds: Normal heart sounds.  Pulmonary:     Effort: Pulmonary effort is normal.     Breath sounds: Normal breath  sounds.  Abdominal:     General: Abdomen is flat. There is no distension.     Palpations: Abdomen is soft.  Musculoskeletal:        General: Signs of injury present. No swelling. Normal range of motion.     Cervical back: Normal range of motion.     Right lower leg: No edema.     Left lower leg: No edema.     Comments: Right leg muscle strain (hamstring/knee)  Skin:    General: Skin is warm and dry.     Capillary Refill: Capillary refill takes less than 2 seconds.  Neurological:     General: No focal deficit present.     Mental Status: He is alert. Mental status is at baseline.  Psychiatric:        Mood and Affect: Mood normal.     Arrived for home visit for [name] who was alert and oriented seated on the front porch of his home reporting to not be feeling well today. Russell Collier says he received his booster shot yesterday of his Covid Vaccination and is feeling very weak and tired today. I report to him these are normal side effects of the booster vaccine and if symptoms persist over 48 hours to let me know. He agreed and understood. I obtained vitals and they were normotensive. Russell Collier stated his pain was elevated due to his recent back surgery and a leg injury he obtained over the weekend while walking up a small hill to get to his front steps at his home. No obvious external injury noted on assessment. I reviewed discharge papers from [facility] and reviewed medications and filled up a pill box for [name] as he was previously getting bubble packs from [facility]. I will be reaching out to pharmacy to have them restarted.   Since his discharge from [facility] he has been using a cane to get around and doing well with this. He reports generalized pain from the surgery site but is helped by his medications. (Diluadid)   I provided [name] with a list of upcoming appointments. I will be out to see [name] in one week. Home visit complete.       Needs  an RX for: Baclofen Pantoprazole  Colchicine (as needed)  Senna (one tablet twice daily)  Gabapentin 160m (2 capsules TID)     Future Appointments  Date Time Provider D[place] 06/17/2020  3:45 PM PEvelina Bucy DPM [facility]  07/01/2020  2:10 PM CGifford Shave MD [facility]    ACTION: Home visit completed Next visit planned for one week

## 2020-06-17 ENCOUNTER — Ambulatory Visit (INDEPENDENT_AMBULATORY_CARE_PROVIDER_SITE_OTHER): Payer: Medicaid Other

## 2020-06-17 ENCOUNTER — Ambulatory Visit: Payer: Medicaid Other | Admitting: Podiatry

## 2020-06-17 ENCOUNTER — Other Ambulatory Visit: Payer: Self-pay

## 2020-06-17 DIAGNOSIS — M216X1 Other acquired deformities of right foot: Secondary | ICD-10-CM | POA: Diagnosis not present

## 2020-06-17 DIAGNOSIS — M1009 Idiopathic gout, multiple sites: Secondary | ICD-10-CM | POA: Diagnosis not present

## 2020-06-17 DIAGNOSIS — M21619 Bunion of unspecified foot: Secondary | ICD-10-CM

## 2020-06-17 DIAGNOSIS — M109 Gout, unspecified: Secondary | ICD-10-CM

## 2020-06-17 MED ORDER — BETAMETHASONE SOD PHOS & ACET 6 (3-3) MG/ML IJ SUSP
3.0000 mg | Freq: Once | INTRAMUSCULAR | Status: AC
  Administered 2020-06-17: 3 mg

## 2020-06-17 NOTE — Progress Notes (Signed)
  Subjective:  Patient ID: Jaki Hammerschmidt, male    DOB: September 13, 1960,  MRN: [id number]  Chief Complaint  Patient presents with  . Nail Problem    RFC Nail trim  . Bunions    Right foot bunion pain    60 y.o. male presents with the above complaint. History confirmed with patient. Also complains of recent flare up of his right foot bunion. Does have a hx of gout. Did not trial any treatments for it, it is still hurting but not as much now.  Objective:  Physical Exam: warm, good capillary refill, no trophic changes or ulcerative lesions, normal DP and PT pulses and normal sensory exam.  Right Foot: POP R 1st MPJ, resolving erythema, no active warmth. Prominent HAV deformity.  Assessment:   1. Bunion   2. Acute gout of right foot, unspecified cause    Plan:  Patient was evaluated and treated and all questions answered.  Gout R 1st MPJ -Repeat injection delivered to the right first MPJ  Return in about 3 months (around 09/16/2020) for Gout f/u.

## 2020-06-23 ENCOUNTER — Other Ambulatory Visit (HOSPITAL_COMMUNITY): Payer: Self-pay

## 2020-06-23 ENCOUNTER — Telehealth: Payer: Self-pay | Admitting: Family Medicine

## 2020-06-23 MED ORDER — GABAPENTIN 100 MG PO CAPS: 100.0000 mg | ORAL_CAPSULE | Freq: Three times a day (TID) | ORAL | 3 refills | Status: DC

## 2020-06-23 MED ORDER — PANTOPRAZOLE SODIUM 40 MG PO TBEC: 40.0000 mg | DELAYED_RELEASE_TABLET | Freq: Every day | ORAL | 0 refills | Status: DC

## 2020-06-23 NOTE — Telephone Encounter (Signed)
Pharmacist asked about gabapentin since he was on pregabalin, does he need to be on both? Russell Collier, CMA

## 2020-06-23 NOTE — Progress Notes (Signed)
Paramedicine Encounter    Patient ID: Russell Collier, male    DOB: August 10, 1960, 60 y.o.   MRN: [id number]  Russell Collier out for a medication verification for Russell Collier. He picked up his prescriptions from [facility] and needed pill boxes filled until we can get back on track for bubble packs. I verified medications and filled two weeks worth of pill boxes with appropriate medications. Russell Collier agreed with two week follow up and then to restart bubble packs.   Russell Collier having a lot of pain from pulled hamstring muscle in his right leg, he is taking Baclofen and Dilaudid in the individual bubble packs prescribed by  the doctors at the nursing facility. He does not have a new standing prescription for this, I advised him to reach out to his back doctor for them to send these prescriptions to his pharmacy if approved.  I will be back out in two weeks.     Home visit complete.   ACTION: Home visit completed

## 2020-06-23 NOTE — Telephone Encounter (Signed)
Russell Collier walked in stating he has been out of his medications for 4 days Eliquis, Farxiga, Lasix, Isosorbide, gabapentin and pantoprazole  Last appt was 03/25/20 with Dr. Caron Presume pls call  Ph [phone]  Mgs sent to nurse

## 2020-06-23 NOTE — Telephone Encounter (Signed)
Most of the requested medications have refills but Gabapentin and Pantoprazole did not so sent refills for these. Let me know if you need anything else.

## 2020-06-23 NOTE — Telephone Encounter (Signed)
Contacted the pharmacy about this patients medicines having refills and they said that they do have some on hold and that they are going to begin filling them as prescribed.  If there is any changes that need to be made for his medications please call pharmacy and inform them of this.Russell Collier Cypert Zimmerman Rumple, CMA

## 2020-06-24 DIAGNOSIS — M545 Low back pain, unspecified: Secondary | ICD-10-CM | POA: Diagnosis not present

## 2020-06-24 DIAGNOSIS — H2513 Age-related nuclear cataract, bilateral: Secondary | ICD-10-CM | POA: Diagnosis not present

## 2020-06-24 DIAGNOSIS — M4316 Spondylolisthesis, lumbar region: Secondary | ICD-10-CM | POA: Diagnosis not present

## 2020-06-24 NOTE — Telephone Encounter (Signed)
Spoke with Russell Collier as well as [name] regarding the gabapentin versus Lyrica.  The patient prefers the Lyrica over the gabapentin so I removed gabapentin from his med list.  [name] said that she would let the pharmacy know.

## 2020-06-25 ENCOUNTER — Telehealth: Payer: Self-pay | Admitting: Pharmacist

## 2020-06-25 NOTE — Telephone Encounter (Signed)
-----   Message from Leavy Cella, [place] sent at 06/05/2020 12:00 PM EDT ----- Regarding: tobacco Cessation

## 2020-06-25 NOTE — Telephone Encounter (Signed)
Patient contacted RE tobacco intake reduction.   Patient reports returning home from his nursing home/rehab stay.   He reports walking 1/4 of a mile with a rolling walker today.   He also reports smoking 8 cigarettes.   Following some discussion, he admits that he realizes his pain is preventing him from focusing on tobacco cessation.   We agreed I would call him again in 1 month.  He set a goal of < 8 (goal per him was 2-4).  Reevaluate progress and usefulness of additional calls at that time.

## 2020-06-26 ENCOUNTER — Other Ambulatory Visit: Payer: Self-pay

## 2020-06-26 ENCOUNTER — Emergency Department (HOSPITAL_COMMUNITY): Payer: Medicaid Other

## 2020-06-26 ENCOUNTER — Emergency Department (HOSPITAL_COMMUNITY)
Admission: EM | Admit: 2020-06-26 | Discharge: 2020-06-26 | Disposition: A | Discharge status: Home / Self Care | Payer: Medicaid Other | Attending: Emergency Medicine | Admitting: Emergency Medicine

## 2020-06-26 DIAGNOSIS — Z981 Arthrodesis status: Secondary | ICD-10-CM | POA: Diagnosis not present

## 2020-06-26 DIAGNOSIS — M549 Dorsalgia, unspecified: Secondary | ICD-10-CM | POA: Diagnosis not present

## 2020-06-26 DIAGNOSIS — Z79899 Other long term (current) drug therapy: Secondary | ICD-10-CM | POA: Diagnosis not present

## 2020-06-26 DIAGNOSIS — M25561 Pain in right knee: Secondary | ICD-10-CM | POA: Insufficient documentation

## 2020-06-26 DIAGNOSIS — I5033 Acute on chronic diastolic (congestive) heart failure: Secondary | ICD-10-CM | POA: Insufficient documentation

## 2020-06-26 DIAGNOSIS — E119 Type 2 diabetes mellitus without complications: Secondary | ICD-10-CM | POA: Diagnosis not present

## 2020-06-26 DIAGNOSIS — I2510 Atherosclerotic heart disease of native coronary artery without angina pectoris: Secondary | ICD-10-CM | POA: Diagnosis not present

## 2020-06-26 DIAGNOSIS — Z7901 Long term (current) use of anticoagulants: Secondary | ICD-10-CM | POA: Diagnosis not present

## 2020-06-26 DIAGNOSIS — F17210 Nicotine dependence, cigarettes, uncomplicated: Secondary | ICD-10-CM | POA: Insufficient documentation

## 2020-06-26 DIAGNOSIS — M545 Low back pain, unspecified: Secondary | ICD-10-CM | POA: Insufficient documentation

## 2020-06-26 DIAGNOSIS — Z9889 Other specified postprocedural states: Secondary | ICD-10-CM | POA: Diagnosis not present

## 2020-06-26 DIAGNOSIS — M1711 Unilateral primary osteoarthritis, right knee: Secondary | ICD-10-CM | POA: Diagnosis not present

## 2020-06-26 DIAGNOSIS — Z7984 Long term (current) use of oral hypoglycemic drugs: Secondary | ICD-10-CM | POA: Insufficient documentation

## 2020-06-26 DIAGNOSIS — M533 Sacrococcygeal disorders, not elsewhere classified: Secondary | ICD-10-CM | POA: Diagnosis not present

## 2020-06-26 DIAGNOSIS — I110 Hypertensive heart disease with heart failure: Secondary | ICD-10-CM | POA: Diagnosis not present

## 2020-06-26 DIAGNOSIS — W19XXXA Unspecified fall, initial encounter: Secondary | ICD-10-CM | POA: Diagnosis not present

## 2020-06-26 LAB — CBG MONITORING, ED: Glucose-Capillary: 109 mg/dL — ABNORMAL HIGH (ref 70–99)

## 2020-06-26 NOTE — ED Notes (Signed)
Assuming care. Pt not @ bedside. Per staff pt got up and left. Was just waiting on discharge paperwork, stated "Ill just come back later and get them."

## 2020-06-26 NOTE — ED Provider Notes (Signed)
Emergency Medicine Provider Triage Evaluation Note  Russell Collier , a 59 y.o. male  was evaluated in triage.  Pt complains of back pain and right knee pain    Review of Systems  Positive:back pain  Negative: No fever no chills  Physical Exam  BP 115/72 (BP Location: Right Arm)   Pulse 100   Temp 98.7 F (37.1 C) (Oral)   Resp 18   SpO2 97%  Gen:   Awake, no distress   Resp:  Normal effort  MSK:   Moves extremities without difficulty  Other:  Tender right knee,   Medical Decision Making  Medically screening exam initiated at 12:43 PM.  Appropriate orders placed.  Russell Collier was informed that the remainder of the evaluation will be completed by another provider, this initial triage assessment does not replace that evaluation, and the importance of remaining in the ED until their evaluation is complete.     Sidney Ace 06/26/20 1244    Davonna Belling, MD 06/26/20 [phone]
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
## 2020-06-26 NOTE — ED Triage Notes (Signed)
Pt reports pulling a muscle while walking in his driveway last week. Pain continues in back and R leg. PCP appointment tomorrow but wants to get checked out.

## 2020-06-26 NOTE — Discharge Instructions (Signed)
Follow up with your normal doctor and ortho doctor.

## 2020-06-26 NOTE — Progress Notes (Signed)
Orthopedic Tech Progress Note Patient Details:  Russell Collier Oct 19, 1960 [id number]  Ortho Devices Type of Ortho Device: Knee Immobilizer Ortho Device/Splint Location: RLE Ortho Device/Splint Interventions: Ordered,Application,Adjustment   Post Interventions Patient Tolerated: Well Instructions Provided: Adjustment of device,Care of device,Poper ambulation with device   [name] 06/26/2020, 6:31 PM

## 2020-06-27 ENCOUNTER — Telehealth: Payer: Self-pay | Admitting: *Deleted

## 2020-06-27 NOTE — Telephone Encounter (Signed)
Transition Care Management Follow-up Telephone Call  Date of discharge and from where: 06/26/2020 - [facility]  How have you been since you were released from the hospital? "I am okay"  Any questions or concerns? No  Items Reviewed:  Did the pt receive and understand the discharge instructions provided? Yes   Medications obtained and verified? Yes   Other? No   Any new allergies since your discharge? No   Dietary orders reviewed? No  Do you have support at home? Yes    Functional Questionnaire: (I = Independent and D = Dependent) ADLs: I  Bathing/Dressing- I  Meal Prep- I  Eating- I  Maintaining continence- I  Transferring/Ambulation- I  Managing Meds- I  Follow up appointments reviewed:   PCP Hospital f/u appt confirmed? Yes  Scheduled to see Dr. Jari Sportsman on 07/01/2020 @ 1410.  [facility] f/u appt confirmed? No    Are transportation arrangements needed? No   If their condition worsens, is the pt aware to call PCP or go to the Emergency Dept.? Yes  Was the patient provided with contact information for the PCP's office or ED? Yes  Was to pt encouraged to call back with questions or concerns? Yes

## 2020-07-01 ENCOUNTER — Ambulatory Visit (HOSPITAL_COMMUNITY)
Admission: EM | Admit: 2020-07-01 | Discharge: 2020-07-01 | Disposition: A | Discharge status: Home / Self Care | Payer: Medicaid Other

## 2020-07-01 ENCOUNTER — Encounter: Payer: Self-pay | Admitting: Family Medicine

## 2020-07-01 ENCOUNTER — Other Ambulatory Visit: Payer: Self-pay

## 2020-07-01 ENCOUNTER — Ambulatory Visit (INDEPENDENT_AMBULATORY_CARE_PROVIDER_SITE_OTHER): Payer: Medicaid Other | Admitting: Family Medicine

## 2020-07-01 DIAGNOSIS — S86811A Strain of other muscle(s) and tendon(s) at lower leg level, right leg, initial encounter: Secondary | ICD-10-CM | POA: Diagnosis present

## 2020-07-01 NOTE — Progress Notes (Signed)
    SUBJECTIVE:   CHIEF COMPLAINT / HPI:   Right lower extremity pain Patient reports he was walking from his car up to his house on Sunday when his right knee locked up.  He then had assistance going from where his knee locked up up to his house but reports that since then he has been having pain in his right calf with it is tender to palpation.  Pain is relieved by wearing pressure stockings.  Pain worsened with movement.  He reports that he has not been taking much for pain although he just finished a course of Dilaudid which she received from his spinal surgeon.  Reports that it is extremely tender to palpation and he has noticed warmth but it is not warm at this time.  He reports that the swelling goes down with the compression stockings and that he just wanted it evaluated before he goes to a family member's graduation.  OBJECTIVE:   BP 128/64   Pulse 95   Ht 5\' 11"  (1.803 m)   Wt 294 lb 3.2 oz (133.4 kg)   SpO2 96%   BMI 41.03 kg/m   General: Uncomfortable appearing 60 year old male in no acute distress MSK: Patient wearing compression socks on the right lower extremity.  Patient has a flat which is tender to palpation only medial aspect just distal to his right knee.  No swelling, erythema, warmth noted.  Scar tissue is palpable beneath the skin.  No swelling in the rest of the calf or erythema.  No gross abnormalities noted.  ASSESSMENT/PLAN:   Strain of right calf muscle Patient's current complaints consistent with a right calf strain.  Patient was seen and evaluated in the emergency department when he injured the leg and had x-rays completed which were negative for any acute injuries.  We discussed the signs and symptoms of a DVT and this does not match with those findings.  Patient was given instructions for rest, continued compression, ice, Tylenol as needed.  Strict ED precautions given and patient able to identify signs and symptoms of a DVT including worsening edema, erythema,  pain, warmth.  Patient reports that he will be evaluated if he notices any of these signs.  No further questions or concerns at this time.     [name], MD [place]

## 2020-07-01 NOTE — Patient Instructions (Signed)
It was great seeing you today.  I think you most likely pulled a muscle in your leg.  The one thing that I want you to watch out for is warm, swelling, pain in a central location in your leg.  If you notice that swelling in redness please get seen.  Otherwise you can use Tylenol for pain.  Please continue to wear the compression socks as needed.  If you have any questions or concerns please let me know.  I hope you have a wonderful afternoon!   Muscle Strain A muscle strain (pulled muscle) happens when a muscle is stretched beyond normal length. This can happen during a fall, sports, or lifting. This can tear some muscle fibers. Usually, recovery from muscle strain takes 1-2 weeks. Complete healing normally takes 5-6 weeks. This condition is first treated with PRICE therapy. This involves:  Protecting your muscle from being injured again.  Resting your injured muscle.  Icing your injured muscle.  Applying pressure (compression) to your injured muscle. This may be done with a splint or elastic bandage.  Raising (elevating) your injured muscle. Your doctor may also recommend medicine for pain. Follow these instructions at home: If you have a splint:  Wear the splint as told by your doctor. Take it off only as told by your doctor.  Loosen the splint if your fingers or toes tingle, get numb, or turn cold and blue.  Keep the splint clean.  If the splint is not waterproof: ? Do not let it get wet. ? Cover it with a watertight covering when you take a bath or a shower. Managing pain, stiffness, and swelling  If told, put ice on your injured area: ? If you have a removable splint, take it off as told by your doctor. ? Put ice in a plastic bag. ? Place a towel between your skin and the bag. ? Leave the ice on for 20 minutes, 2-3 times a day.  Move your fingers or toes often. This helps to avoid stiffness and lessen swelling.  Raise your injured area above the level of your heart while  you are sitting or lying down.  Wear an elastic bandage as told by your doctor. Make sure it is not too tight.   General instructions  Take over-the-counter and prescription medicines only as told by your doctor. This may include medicines for pain and swelling that are taken by mouth or put on the skin, prescription pain medicine, or muscle relaxants.  Limit your activity. Rest your injured muscle as told by your doctor. Your doctor may say that gentle movements are okay.  If physical therapy was prescribed, do exercises as told by your doctor.  Do not put pressure on any part of the splint until it is fully hardened. This may take many hours.  Do not use any products that contain nicotine or tobacco, such as cigarettes and e-cigarettes. These can delay bone healing. If you need help quitting, ask your doctor.  Warm up before you exercise. This helps to prevent more muscle strains.  Ask your doctor when it is safe to drive if you have a splint.  Keep all follow-up visits as told by your doctor. This is important. Contact a doctor if:  You have more pain or swelling in your injured area. Get help right away if:  You have any of these problems in your injured area: ? You have numbness. ? You have tingling. ? You lose a lot of strength. Summary  A  muscle strain is an injury that happens when a muscle is stretched longer than normal.  This condition is first treated with PRICE therapy. This includes protecting, resting, icing, adding pressure, and raising your injury.  Limit your activity. Rest your injured muscle as told by your doctor. Your doctor may say that gentle movements are okay.  Warm up before you exercise. This helps to prevent more muscle strains. This information is not intended to replace advice given to you by your health care provider. Make sure you discuss any questions you have with your health care provider. Document Revised: 11/02/2019 Document Reviewed:  11/02/2019 Elsevier Patient Education  2021 [employer].

## 2020-07-01 NOTE — Assessment & Plan Note (Signed)
Patient's current complaints consistent with a right calf strain.  Patient was seen and evaluated in the emergency department when he injured the leg and had x-rays completed which were negative for any acute injuries.  We discussed the signs and symptoms of a DVT and this does not match with those findings.  Patient was given instructions for rest, continued compression, ice, Tylenol as needed.  Strict ED precautions given and patient able to identify signs and symptoms of a DVT including worsening edema, erythema, pain, warmth.  Patient reports that he will be evaluated if he notices any of these signs.  No further questions or concerns at this time.

## 2020-07-02 ENCOUNTER — Telehealth: Payer: Self-pay | Admitting: Family Medicine

## 2020-07-02 ENCOUNTER — Other Ambulatory Visit (HOSPITAL_COMMUNITY): Payer: Self-pay

## 2020-07-02 ENCOUNTER — Other Ambulatory Visit: Payer: Medicaid Other

## 2020-07-02 DIAGNOSIS — M4310 Spondylolisthesis, site unspecified: Secondary | ICD-10-CM

## 2020-07-02 NOTE — Telephone Encounter (Signed)
Received below message from [employer].   received, thanks   Jaydenn Boccio C Khylah Kendra, RN   

## 2020-07-02 NOTE — Telephone Encounter (Signed)
..   Medicaid Managed Care   Unsuccessful Outreach Note  07/02/2020 Name: Russell Collier MRN: [id number] DOB: May 30, 1960  Referred by: [name], MD Reason for referral : High Risk Managed Medicaid (Called to get him scheduled with the MM RNCM and the MM Pharmacist. )   An unsuccessful telephone outreach was attempted today. The patient was referred to the case management team for assistance with care management and care coordination.   Follow Up Plan: The care management team will reach out to the patient again over the next 7-14 days.   [place]

## 2020-07-02 NOTE — Patient Outreach (Signed)
Medicaid Managed Care Social Work Note  07/02/2020 Name:  Russell Collier MRN:  [id number] DOB:  29-Apr-1960  Russell Collier is an 60 y.o. year old male who is a primary patient of [name], MD.  The [facility] Managed Care Coordination team was consulted for assistance with:  wide hospital bed  Russell Collier was given information about Medicaid Managed CareCoordination services today. Russell Collier agreed to services and verbal consent obtained.  Engaged with patient  for by telephone forinitial visit in response to referral for case management and/or care coordination services.   Assessments/Interventions:  Review of past medical history, allergies, medications, health status, including review of consultants reports, laboratory and other test data, was performed as part of comprehensive evaluation and provision of chronic care management services.  SDOH: (Social Determinant of Health) assessments and interventions performed:  BSW received a referral from [facility] for assistance with getting patient DME. BSW contacted patient and he stated he is in need of a wide hospital bed. Patient stated he recently was discharge for SNF and has 8 screws in his back. Patient states he is in need of a hospital bed. Patient stated no other assistance or resources are needed at this time.   Advanced Directives Status:  Not addressed in this encounter.  Care Plan                 Allergies  Allergen Reactions  . Varenicline Other (See Comments)    Patient reports laryngospasm stopped in the ER  . Bupropion Other (See Comments)    Headache - moderate/severe - self discontinued agent     Medications Reviewed Today    Reviewed by Talbot Grumbling, RN (Registered Nurse) on 07/01/20 at [place] List Status: <None>  Medication Order Taking? Sig Documenting Provider Last Dose Status Informant  Accu-Chek Softclix Lancets lancets [id number] No Use as instructed [name], MD Taking Active    albuterol (PROVENTIL) (2.5 MG/3ML) 0.083% nebulizer solution [id number] No Take 2.5 mg by nebulization every 6 (six) hours as needed for wheezing or shortness of breath. [provider] Taking Active Multiple Informants  albuterol (VENTOLIN HFA) 108 (90 Base) MCG/ACT inhaler [id number] No Inhale 2 puffs into the lungs every 6 (six) hours as needed for wheezing or shortness of breath. [name], MD Taking Active   apixaban (ELIQUIS) 2.5 MG TABS tablet [id number] No Take 1 tablet (2.5 mg total) by mouth 2 (two) times daily. [name], MD Taking Active   atorvastatin (LIPITOR) 80 MG tablet [id number] No Take 1 tablet (80 mg total) by mouth daily. [name], MD Taking Active   baclofen (LIORESAL) 10 MG tablet [id number] No Take 5-10 mg by mouth 3 (three) times daily as needed. [provider] Taking Active            Med Note [name] Jun 23, 2020  3:19 PM)    Blood Glucose Monitoring Suppl (ACCU-CHEK GUIDE) w/Device KIT [id number] No 1 Units by Does not apply route daily. [name], MD Taking Active   dapagliflozin propanediol (FARXIGA) 10 MG TABS tablet [id number] No Take 1 tablet (10 mg total) by mouth daily before breakfast. [name], MD Taking Active   enalapril (VASOTEC) 20 MG tablet [id number] No Take 1 tablet (20 mg total) by mouth at bedtime. [name], MD Taking Active   fluticasone [facility]) 50 MCG/ACT nasal spray [id number] No Place 1 spray into both nostrils daily. [name], MD Taking Active   furosemide (LASIX)  40 MG tablet [id number] No Take 1 tablet (40 mg total) by mouth 2 (two) times daily. [name], MD Taking Active   glucose blood (ACCU-CHEK GUIDE) test strip [id number] No Use as instructed [name], MD Taking Active   isosorbide mononitrate (IMDUR) 30 MG 24 hr tablet [id number] No Take 1 tablet (30 mg total) by mouth daily. [name], MD Taking Active   Multiple Vitamin (MULTIVITAMIN  WITH MINERALS) TABS tablet [id number] No Take 1 tablet by mouth daily. [provider] Taking Active   pantoprazole (PROTONIX) 40 MG tablet [id number] No Take 1 tablet (40 mg total) by mouth daily. [name], MD Taking Active   potassium chloride SA (KLOR-CON) 20 MEQ tablet [id number] No TAKE 1 TABLET(20 MEQ) BY MOUTH DAILY [name], MD Taking Active   pregabalin (LYRICA) 100 MG capsule [id number] No TAKE ONE CAPSULE BY MOUTH THREE TIMES DAILY [name], MD Taking Active   Tafamidis 61 MG CAPS [id number] No TAKE 1 CAPSULE BY MOUTH DAILY. [name], [name], MD Taking Active   umeclidinium-vilanterol [facility] ELLIPTA) 62.5-25 MCG/INH AEPB [id number] No Inhale 1 puff into the lungs daily as needed. [name], MD Taking Active   valACYclovir (VALTREX) 500 MG tablet [id number] No Take 1 tablet (500 mg total) by mouth daily. [name], MD Taking Active           Patient Active Problem List   Diagnosis Date Noted  . Strain of right calf muscle 07/01/2020  . 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 COPD exacerbation ([place]) 03/20/2020  . Type 2 diabetes mellitus with diabetic peripheral angiopathy without gangrene, without long-term current use of insulin ([place]) 03/20/2020  . Coagulation defect ([place]) 12/11/2019  . Fatigue 12/10/2019  . Leg cramps 12/10/2019  . Mild epistaxis 10/30/2019  . Disc degeneration, 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  . Ganglion of right knee 09/27/2019  . Lumbar radiculopathy 09/27/2019  . Lower extremity edema   . 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 Acute respiratory failure ([place]) 07/27/2019  . Primary osteoarthritis of right knee 07/24/2019  . Breast tenderness in male 05/31/2019  . Chronic pain 05/31/2019  . AKI (acute kidney injury) ([place]) 05/31/2019  . Pain due to onychomycosis of toenails of both feet 05/16/2019  . Chronic knee pain 04/16/2019  . Diabetes ([place]) 04/16/2019  . Esophageal reflux 02/06/2019  . Heartburn 02/06/2019  . Chronic  skin ulcer of lower leg ([place]) 12/21/2018  . S/P drug eluting coronary stent placement   . Coronary artery disease involving native coronary artery of native heart with unstable angina pectoris ([place]) 12/20/2018  . Unstable angina ([place])   . 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 Acute on chronic diastolic heart failure ([place])   . Shortness of breath   . Precordial chest pain   . Idiopathic peripheral neuropathy   . Palpitations   . Chronic diastolic heart failure ([place])   . Abnormal ankle brachial index (ABI)   . Chest pain, rule out acute myocardial infarction 09/26/2018  . History of cocaine abuse ([place]) 08/20/2018  . Marijuana use 08/20/2018  . Morbid obesity with BMI of 40.0-44.9, adult ([place]) 08/20/2018  . Dyspepsia   . Morbid obesity ([place])   . OSA on CPAP   . Chest pain 05/30/2018  . CAD (coronary artery disease) 03/30/2018  . Elevated troponin 02/03/2018  . Positive urine drug screen 02/03/2018  . Tobacco abuse 02/03/2018  . Hyperlipidemia 02/03/2018  . Acute respiratory failure with hypoxia ([place]) 02/02/2018  . Acute congestive heart failure ([place])   . Keratoconjunctivitis  sicca of left eye not specified as Sjogren's 09/06/2017  . Long term current use of oral hypoglycemic drug 09/06/2017  . Mechanical ectropion of left lower eyelid 09/06/2017  . Nuclear sclerotic cataract of both eyes 09/06/2017  . Refractive amblyopia, left 09/06/2017  . Type 2 diabetes mellitus without complication, without long-term current use of insulin ([place]) 09/06/2017  . Hyperopia of both eyes with astigmatism and presbyopia 09/06/2017  . Atypical chest pain 12/27/2015  . Essential hypertension   . Neuropathy     Conditions to be addressed/monitored per PCP order:  hospital bed  There are no care plans that you recently modified to display for this patient.   Follow up:  Patient agrees to Care Plan and Follow-up.  Plan: The Managed Medicaid care management team will reach out to the patient again over the  next 10 days. Once a response is received from PCP office.    Mickel Fuchs, BSW, [place] Managed Medicaid Team  [phone]

## 2020-07-02 NOTE — Telephone Encounter (Signed)
Community message sent to [employer]. Will await response.   Elyn Krogh C Jatoria Kneeland, RN  

## 2020-07-02 NOTE — Telephone Encounter (Signed)
Called patient to discuss need for hospital bed.  He reports that he has had difficulty sleeping since being discharged from the SNF and has difficulty getting in and out of bed.  Is requesting an order for a hospital bed.  Unable to identify a discharge summary from SNF but patient is going to contact SNF to see if the physical therapist recommended hospital bed.  I am going to place an order for DME hospital bed.  Discussed with patient that he may be responsible for the cost if it is not an approved and patient voices his understanding.

## 2020-07-02 NOTE — Patient Instructions (Signed)
Visit Information  Russell Collier was given information about Medicaid Managed Care team care coordination services as a part of their [place] Medicaid benefit. Russell Collier verbally consented to engagement with the [facility] Managed Care team.   For questions related to your [facility], please call: [phone] or visit the homepage here: [email or web address]  If you would like to schedule transportation through your [facility], please call the following number at least 2 days in advance of your appointment: [phone].   Call the [place] at [phone], at any time, 24 hours a day, 7 days a week. If you are in danger or need immediate medical attention call 911.  Russell Collier - following are the goals we discussed in your visit today:  Goals Addressed   None      Social Worker will follow up with patient once a response is receieved from PCP office.   Russell Collier, BSW, [place] Managed Medicaid Team  [phone]  Following is a copy of your plan of care:  There are no care plans to display for this patient.

## 2020-07-04 DIAGNOSIS — M5136 Other intervertebral disc degeneration, lumbar region: Secondary | ICD-10-CM | POA: Diagnosis not present

## 2020-07-04 DIAGNOSIS — Z981 Arthrodesis status: Secondary | ICD-10-CM | POA: Diagnosis not present

## 2020-07-07 ENCOUNTER — Other Ambulatory Visit: Payer: Self-pay

## 2020-07-07 ENCOUNTER — Other Ambulatory Visit (HOSPITAL_COMMUNITY): Payer: Self-pay

## 2020-07-07 MED FILL — Tafamidis Cap 61 MG: ORAL | 30 days supply | Qty: 30 | Fill #1 | Status: AC

## 2020-07-07 NOTE — Progress Notes (Signed)
Paramedicine Encounter    Patient ID: Russell Collier, male    DOB: 12-27-1960, 60 y.o.   MRN: [id number]   Patient Care Team: [name], MD as PCP - General (Family Medicine) [name], MD as PCP - Cardiology (Cardiology) Michael Boston, MD as Consulting Physician (General Surgery) Wilford Corner, MD as Consulting Physician (Gastroenterology) Jorge Ny, LCSW as Social Worker (Licensed Clinical Social Worker) Alda Berthold, DO as Consulting Physician (Neurology) Jola Baptist, [place] as Referring Physician (Chiropractic Medicine) Ethelda Chick as Social Worker Lane Hacker, [facility] as Pharmacist (Pharmacist)  Patient Active Problem List   Diagnosis Date Noted  . Strain of right calf muscle 07/01/2020  . 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 COPD exacerbation ([place]) 03/20/2020  . Type 2 diabetes mellitus with diabetic peripheral angiopathy without gangrene, without long-term current use of insulin ([place]) 03/20/2020  . Coagulation defect ([place]) 12/11/2019  . Fatigue 12/10/2019  . Leg cramps 12/10/2019  . Mild epistaxis 10/30/2019  . Disc degeneration, 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  . Ganglion of right knee 09/27/2019  . Lumbar radiculopathy 09/27/2019  . Lower extremity edema   . 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 Acute respiratory failure ([place]) 07/27/2019  . Primary osteoarthritis of right knee 07/24/2019  . Breast tenderness in male 05/31/2019  . Chronic pain 05/31/2019  . AKI (acute kidney injury) ([place]) 05/31/2019  . Pain due to onychomycosis of toenails of both feet 05/16/2019  . Chronic knee pain 04/16/2019  . Diabetes ([place]) 04/16/2019  . Esophageal reflux 02/06/2019  . Heartburn 02/06/2019  . Chronic skin ulcer of lower leg ([place]) 12/21/2018  . S/P drug eluting coronary stent placement   . Coronary artery disease involving native coronary artery of native heart with unstable angina pectoris ([place]) 12/20/2018  . Unstable angina ([place])   . Laryngeal spasm  10/17/2018  . Acute on chronic diastolic heart failure ([place])   . Shortness of breath   . Precordial chest pain   . Idiopathic peripheral neuropathy   . Palpitations   . Chronic diastolic heart failure ([place])   . Abnormal ankle brachial index (ABI)   . Chest pain, rule out acute myocardial infarction 09/26/2018  . History of cocaine abuse ([place]) 08/20/2018  . Marijuana use 08/20/2018  . Morbid obesity with BMI of 40.0-44.9, adult ([place]) 08/20/2018  . Dyspepsia   . Morbid obesity ([place])   . OSA on CPAP   . Chest pain 05/30/2018  . CAD (coronary artery disease) 03/30/2018  . Elevated troponin 02/03/2018  . Positive urine drug screen 02/03/2018  . Tobacco abuse 02/03/2018  . Hyperlipidemia 02/03/2018  . Acute respiratory failure with hypoxia ([place]) 02/02/2018  . Acute congestive heart failure ([place])   . Keratoconjunctivitis sicca of left eye not specified as Sjogren's 09/06/2017  . Long term current use of oral hypoglycemic drug 09/06/2017  . Mechanical ectropion of left lower eyelid 09/06/2017  . Nuclear sclerotic cataract of both eyes 09/06/2017  . Refractive amblyopia, left 09/06/2017  . Type 2 diabetes mellitus without complication, without long-term current use of insulin ([place]) 09/06/2017  . Hyperopia of both eyes with astigmatism and presbyopia 09/06/2017  . Atypical chest pain 12/27/2015  . Essential hypertension   . Neuropathy     Current Outpatient Medications:  .  Accu-Chek Softclix Lancets lancets, Use as instructed, Disp: 100 each, Rfl: 12 .  albuterol (PROVENTIL) (2.5 MG/3ML) 0.083% nebulizer solution, Take 2.5 mg by nebulization every 6 (six) hours as needed for wheezing or shortness of breath., Disp: ,  Rfl:  .  albuterol (VENTOLIN HFA) 108 (90 Base) MCG/ACT inhaler, Inhale 2 puffs into the lungs every 6 (six) hours as needed for wheezing or shortness of breath., Disp: 18 g, Rfl: 3 .  apixaban (ELIQUIS) 2.5 MG TABS tablet, Take 1 tablet (2.5 mg total) by mouth 2 (two) times  daily., Disp: 60 tablet, Rfl: 11 .  atorvastatin (LIPITOR) 80 MG tablet, Take 1 tablet (80 mg total) by mouth daily., Disp: 90 tablet, Rfl: 3 .  baclofen (LIORESAL) 10 MG tablet, Take 5-10 mg by mouth 3 (three) times daily as needed., Disp: , Rfl:  .  Blood Glucose Monitoring Suppl (ACCU-CHEK GUIDE) w/Device KIT, 1 Units by Does not apply route daily., Disp: 1 kit, Rfl: 0 .  dapagliflozin propanediol (FARXIGA) 10 MG TABS tablet, Take 1 tablet (10 mg total) by mouth daily before breakfast., Disp: 30 tablet, Rfl: 6 .  enalapril (VASOTEC) 20 MG tablet, Take 1 tablet (20 mg total) by mouth at bedtime., Disp: 90 tablet, Rfl: 3 .  fluticasone (FLONASE) 50 MCG/ACT nasal spray, Place 1 spray into both nostrils daily., Disp: 16 g, Rfl: 11 .  furosemide (LASIX) 40 MG tablet, Take 1 tablet (40 mg total) by mouth 2 (two) times daily., Disp: 180 tablet, Rfl: 3 .  glucose blood (ACCU-CHEK GUIDE) test strip, Use as instructed, Disp: 100 each, Rfl: 12 .  isosorbide mononitrate (IMDUR) 30 MG 24 hr tablet, Take 1 tablet (30 mg total) by mouth daily., Disp: 90 tablet, Rfl: 3 .  Multiple Vitamin (MULTIVITAMIN WITH MINERALS) TABS tablet, Take 1 tablet by mouth daily., Disp: , Rfl:  .  pantoprazole (PROTONIX) 40 MG tablet, Take 1 tablet (40 mg total) by mouth daily., Disp: 90 tablet, Rfl: 0 .  potassium chloride SA (KLOR-CON) 20 MEQ tablet, TAKE 1 TABLET(20 MEQ) BY MOUTH DAILY, Disp: 90 tablet, Rfl: 3 .  pregabalin (LYRICA) 100 MG capsule, TAKE ONE CAPSULE BY MOUTH THREE TIMES DAILY, Disp: 270 capsule, Rfl: 2 .  Tafamidis 61 MG CAPS, TAKE 1 CAPSULE BY MOUTH DAILY., Disp: 30 capsule, Rfl: 11 .  umeclidinium-vilanterol (ANORO ELLIPTA) 62.5-25 MCG/INH AEPB, Inhale 1 puff into the lungs daily as needed., Disp: 60 each, Rfl: 3 .  valACYclovir (VALTREX) 500 MG tablet, Take 1 tablet (500 mg total) by mouth daily., Disp: 90 tablet, Rfl: 3 Allergies  Allergen Reactions  . Varenicline Other (See Comments)    Patient reports  laryngospasm stopped in the ER  . Bupropion Other (See Comments)    Headache - moderate/severe - self discontinued agent      Social History   Socioeconomic History  . Marital status: Divorced    Spouse name: Not on file  . Number of children: 2  . Years of education: 10  . Highest education level: Not on file  Occupational History  . Occupation: not employed  Tobacco Use  . Smoking status: Current Every Day Smoker    Packs/day: 0.50    Years: 54.00    Pack years: 27.00    Types: Cigarettes, Cigars  . Smokeless tobacco: Never Used  . Tobacco comment: half pack daily  Vaping Use  . Vaping Use: Never used  Substance and Sexual Activity  . Alcohol use: Yes    Alcohol/week: 4.0 standard drinks    Types: 2 Cans of beer, 2 Shots of liquor per week  . Drug use: Not Currently    Comment: last use may last year  . Sexual activity: Yes  Other Topics Concern  .  Not on file  Social History Narrative   Right handed   Two story home   No caffeine   Social Determinants of Health   Financial Resource Strain: Not on file  Food Insecurity: Not on file  Transportation Needs: Not on file  Physical Activity: Not on file  Stress: Not on file  Social Connections: Not on file  Intimate Partner Violence: Not on file    Physical Exam Vitals reviewed.  Constitutional:      Appearance: Normal appearance. He is normal weight.  HENT:     Head: Normocephalic.     Nose: Nose normal.     Mouth/Throat:     Mouth: Mucous membranes are moist.     Pharynx: Oropharynx is clear.  Eyes:     Conjunctiva/sclera: Conjunctivae normal.     Pupils: Pupils are equal, round, and reactive to light.  Cardiovascular:     Rate and Rhythm: Normal rate and regular rhythm.     Pulses: Normal pulses.  Pulmonary:     Effort: Pulmonary effort is normal.     Breath sounds: Normal breath sounds.  Abdominal:     General: Abdomen is flat.     Palpations: Abdomen is soft.  Musculoskeletal:        General:  Normal range of motion.     Cervical back: Normal range of motion.     Right lower leg: No edema.     Left lower leg: No edema.  Skin:    General: Skin is warm and dry.     Capillary Refill: Capillary refill takes less than 2 seconds.  Neurological:     General: No focal deficit present.     Mental Status: He is alert. Mental status is at baseline.  Psychiatric:        Mood and Affect: Mood normal.   Arrived for home visit for Clete who was alert and oriented reporting to be in pain in his back with increased pain on ROM. [place] has been re-evaluated by his surgeon and he has been reported to be healing well. Moris reports he will be starting home physical therapy and will be having a home aide come out during the week.   I reviewed vitals and they are as noted.   I reviewed medications and pill boxes were filled accordingly for two weeks.   I also spoke to [facility] with [place] approved by [name]. Ovid Curd will be setting Mr. Ramone up with pill packs with [facility] and they will be delivering these. I reviewed medications with Ovid Curd and verified times of days in which he takes them.    I will discuss with HF clinic staff on continuation of paramedicine with Mr. Noy per Dr. Haroldine Laws.   Refills: NONE        Future Appointments  Date Time Provider [place]  07/11/2020 10:15 AM Leandrew Koyanagi, MD [facility] None  07/11/2020  1:00 PM [facility] CARE MANAGER [facility] None  08/07/2020  9:30 AM [facility] PHARMACIST [facility] None  09/19/2020  2:15 PM Price, Christian Mate, DPM [facility]     ACTION: Home visit completed Next visit planned for two weeks

## 2020-07-07 NOTE — Patient Instructions (Signed)
Visit Information  Russell Collier was given information about Medicaid Managed Care team care coordination services as a part of their [place] Medicaid benefit. Russell Collier verbally consented to engagement with the [facility] Managed Care team.   For questions related to your [facility], please call: [phone] or visit the homepage here: [email or web address]  If you would like to schedule transportation through your [facility], please call the following number at least 2 days in advance of your appointment: [phone].   Call the [place] at [phone], at any time, 24 hours a day, 7 days a week. If you are in danger or need immediate medical attention call 911.  Russell Collier - following are the goals we discussed in your visit today:  Goals Addressed            This Visit's Progress   . Manage My Medicine       Timeframe:  Short-Term Goal Priority:  High Start Date:                             Expected End Date:                       Follow Up Date Monthly   - keep a list of all the medicines I take; vitamins and herbals too - use a pillbox to sort medicine    Why is this important?   . These steps will help you keep on track with your medicines.   Notes:        Please see education materials related to Chronic Pain provided as print materials.   Patient verbalizes understanding of instructions provided today.   The Managed Medicaid care management team will reach out to the patient again over the next 30 days.   [place] Constable, Pharm.D., Managed Medicaid Pharmacist [phone]   Following is a copy of your plan of care:  Patient Care Plan: Medication Management    Problem Identified: Health Promotion or Disease Self-Management (General Plan of Care)     Goal: Medication Management   Note:   Current Barriers:   . Unable to independently monitor therapeutic efficacy . Unable to self administer medications as prescribed .   Pharmacist Clinical Goal(s):  Marland Kitchen Over the next 30 days, patient will achieve adherence to monitoring guidelines and medication adherence to achieve therapeutic efficacy through collaboration with PharmD and provider.  .   Interventions: . Inter-disciplinary care team collaboration (see longitudinal plan of care) . Comprehensive medication review performed; medication list updated in electronic medical record  @[email or web address] @ Health Maintenance  Patient Goals/Self-Care Activities . Over the next 30 days, patient will:  - take medications as prescribed collaborate with provider on medication access solutions  Follow Up Plan: The care management team will reach out to the patient again over the next 30 days.     Task: Mutually Develop and Royce Macadamia Achievement of Patient Goals   Note:   Care Management Activities:    - verbalization of feelings encouraged    Notes:      Chronic Back Pain When back pain lasts longer than 3 months, it is called chronic back pain. Pain may get worse at certain times (flare-ups). There are things you can do at home to manage your pain. Follow these instructions at home: Pay attention to any changes in your symptoms. Take these actions  to help with your pain: Managing pain and stiffness  If told, put ice on the painful area. Your doctor may tell you to use ice for 24-48 hours after the flare-up starts. To do this: ? Put ice in a plastic bag. ? Place a towel between your skin and the bag. ? Leave the ice on for 20 minutes, 2-3 times a day.  If told, put heat on the painful area. Do this as often as told by your doctor. Use the heat source that your doctor recommends, such as a moist heat pack or a heating pad. ? Place a towel between your skin and the heat source. ? Leave the heat on for 20-30 minutes. ? Take off the heat if your skin  turns bright red. This is especially important if you are unable to feel pain, heat, or cold. You may have a greater risk of getting burned.  Soak in a warm bath. This can help relieve pain.      Activity  Avoid bending and other activities that make pain worse.  When standing: ? Keep your upper back and neck straight. ? Keep your shoulders pulled back. ? Avoid slouching.  When sitting: ? Keep your back straight. ? Relax your shoulders. Do not round your shoulders or pull them backward.  Do not sit or stand in one place for long periods of time.  Take short rest breaks during the day. Lying down or standing is usually better than sitting. Resting can help relieve pain.  When sitting or lying down for a long time, do some mild activity or stretching. This will help to prevent stiffness and pain.  Get regular exercise. Ask your doctor what activities are safe for you.  Do not lift anything that is heavier than 10 lb (4.5 kg) or the limit that you are told, until your doctor says that it is safe.  To prevent injury when you lift things: ? Bend your knees. ? Keep the weight close to your body. ? Avoid twisting.  Sleep on a firm mattress. Try lying on your side with your knees slightly bent. If you lie on your back, put a pillow under your knees.   Medicines  Treatment may include medicines for pain and swelling taken by mouth or put on the skin, prescription pain medicine, or muscle relaxants.  Take over-the-counter and prescription medicines only as told by your doctor.  Ask your doctor if the medicine prescribed to you: ? Requires you to avoid driving or using machinery. ? Can cause trouble pooping (constipation). You may need to take these actions to prevent or treat trouble pooping:  Drink enough fluid to keep your pee (urine) pale yellow.  Take over-the-counter or prescription medicines.  Eat foods that are high in fiber. These include beans, whole grains, and fresh  fruits and vegetables.  Limit foods that are high in fat and sugars. These include fried or sweet foods. General instructions  Do not use any products that contain nicotine or tobacco, such as cigarettes, e-cigarettes, and chewing tobacco. If you need help quitting, ask your doctor.  Keep all follow-up visits as told by your doctor. This is important. Contact a doctor if:  Your pain does not get better with rest or medicine.  Your pain gets worse, or you have new pain.  You have a high fever.  You lose weight very quickly.  You have trouble doing your normal activities. Get help right away if:  One or both of your  legs or feet feel weak.  One or both of your legs or feet lose feeling (have numbness).  You have trouble controlling when you poop (have a bowel movement) or pee (urinate).  You have bad back pain and: ? You feel like you may vomit (nauseous), or you vomit. ? You have pain in your belly (abdomen). ? You have shortness of breath. ? You faint. Summary  When back pain lasts longer than 3 months, it is called chronic back pain.  Pain may get worse at certain times (flare-ups).  Use ice and heat as told by your doctor. Your doctor may tell you to use ice after flare-ups. This information is not intended to replace advice given to you by your health care provider. Make sure you discuss any questions you have with your health care provider. Document Revised: 03/21/2019 Document Reviewed: 03/21/2019 Elsevier Patient Education  2021 [employer].

## 2020-07-07 NOTE — Patient Outreach (Signed)
Medicaid Managed Care    Pharmacy Note  07/07/2020 Name: Russell Collier MRN: [id number] DOB: 06-29-1960  Russell Collier is a 60 y.o. year old male who is a primary care patient of Russell Shave, MD. The [facility] Managed Care Coordination team was consulted for assistance with disease management and care coordination needs.    Engaged with patient Engaged with patient by telephone for initial visit in response to referral for case management and/or care coordination services.  Mr. Knoke was given information about Managed Medicaid Care Coordination team services today. Russell Collier agreed to services and verbal consent obtained.   Objective:  Lab Results  Component Value Date   CREATININE 1.75 (H) 03/25/2020   CREATININE 1.58 (H) 12/10/2019   CREATININE 1.52 (H) 08/14/2019    Lab Results  Component Value Date   HGBA1C 6.0 (A) 03/05/2020       Component Value Date/Time   CHOL 125 03/05/2020 1623   TRIG 82 03/05/2020 1623   HDL 33 (L) 03/05/2020 1623   CHOLHDL 3.8 03/05/2020 1623   CHOLHDL 3.3 07/28/2019 0633   VLDL 15 07/28/2019 0633   LDLCALC 76 03/05/2020 1623    Other: (TSH, CBC, Vit D, etc.)  Clinical ASCVD: Yes  The ASCVD Risk score Russell Collier DC Jr., et al., 2013) failed to calculate for the following reasons:   The valid total cholesterol range is 130 to 320 mg/dL    Other: (CHADS2VASc if Afib, PHQ9 if depression, MMRC or CAT for COPD, ACT, DEXA)  BP Readings from Last 3 Encounters:  07/01/20 128/64  06/26/20 116/76  06/16/20 130/70    Assessment/Interventions: Review of patient past medical history, allergies, medications, health status, including review of consultants reports, laboratory and other test data, was performed as part of comprehensive evaluation and provision of chronic care management services.   Sleep: -Patient just [place] from Nursing home for 3 months post spinal procedure. He's living with his cousin and sleeping on a mattress that's more  cardboard than foam. Plan: This is patient's main priority, was not interested in speaking about meds. Coordinated with Russell Collier and Russell Collier to help  Russell Collier, from [place], [phone], conducts a home visit Q 2 weeks where she also packs meds. She's about to conduct her last visit today Plan: Spoke with patient and Russell Collier, would like to transition to [facility] once she's done with meds Verbal consent obtained for [facility] enhanced pharmacy services (medication synchronization, adherence packaging, delivery coordination). A medication sync plan was created to allow patient to get all medications delivered once every 30 to 90 days per patient preference. Patient understands they have freedom to choose pharmacy and clinical pharmacist will coordinate care between all prescribers and [facility].    SDOH (Social Determinants of Health) assessments and interventions performed:    Care Plan  Allergies  Allergen Reactions  . Varenicline Other (See Comments)    Patient reports laryngospasm stopped in the ER  . Bupropion Other (See Comments)    Headache - moderate/severe - self discontinued agent     Medications Reviewed Today    Reviewed by Talbot Grumbling, RN (Registered Nurse) on 07/01/20 at [place] List Status: <None>  Medication Order Taking? Sig Documenting Provider Last Dose Status Informant  Accu-Chek Softclix Lancets lancets [id number] No Use as instructed Russell Shave, MD Taking Active   albuterol (PROVENTIL) (2.5 MG/3ML) 0.083% nebulizer solution [id number] No Take 2.5 mg by nebulization every 6 (six) hours as needed for wheezing or shortness of breath. [provider] Taking Active Multiple Informants  albuterol (VENTOLIN HFA) 108 (90 Base) MCG/ACT inhaler [id number] No Inhale 2 puffs into the lungs every 6 (six) hours as needed for wheezing or shortness of breath. Russell Shave, MD Taking Active   apixaban (ELIQUIS) 2.5 MG TABS tablet [id number] No  Take 1 tablet (2.5 mg total) by mouth 2 (two) times daily. Russell Shave, MD Taking Active   atorvastatin (LIPITOR) 80 MG tablet [id number] No Take 1 tablet (80 mg total) by mouth daily. Russell Shave, MD Taking Active   baclofen (LIORESAL) 10 MG tablet [id number] No Take 5-10 mg by mouth 3 (three) times daily as needed. [provider] Taking Active            Med Note Russell Collier Jun 23, 2020  3:19 PM)    Blood Glucose Monitoring Suppl (ACCU-CHEK GUIDE) w/Device KIT [id number] No 1 Units by Does not apply route daily. Russell Shave, MD Taking Active   dapagliflozin propanediol (FARXIGA) 10 MG TABS tablet [id number] No Take 1 tablet (10 mg total) by mouth daily before breakfast. Russell Shave, MD Taking Active   enalapril (VASOTEC) 20 MG tablet [id number] No Take 1 tablet (20 mg total) by mouth at bedtime. Russell Shave, MD Taking Active   fluticasone [facility]) 50 MCG/ACT nasal spray [id number] No Place 1 spray into both nostrils daily. Russell Shave, MD Taking Active   furosemide (LASIX) 40 MG tablet [id number] No Take 1 tablet (40 mg total) by mouth 2 (two) times daily. Russell Shave, MD Taking Active   glucose blood (ACCU-CHEK GUIDE) test strip [id number] No Use as instructed Russell Shave, MD Taking Active   isosorbide mononitrate (IMDUR) 30 MG 24 hr tablet [id number] No Take 1 tablet (30 mg total) by mouth daily. Russell Shave, MD Taking Active   Multiple Vitamin (MULTIVITAMIN WITH MINERALS) TABS tablet [id number] No Take 1 tablet by mouth daily. [provider] Taking Active   pantoprazole (PROTONIX) 40 MG tablet [id number] No Take 1 tablet (40 mg total) by mouth daily. Russell Shave, MD Taking Active   potassium chloride SA (KLOR-CON) 20 MEQ tablet [id number] No TAKE 1 TABLET(20 MEQ) BY MOUTH DAILY Russell Shave, MD Taking Active   pregabalin (LYRICA) 100 MG capsule [id number] No TAKE ONE CAPSULE BY MOUTH THREE TIMES DAILY Russell Shave,  MD Taking Active   Tafamidis 61 MG CAPS [id number] No TAKE 1 CAPSULE BY MOUTH DAILY. Collier, Russell Pascal, MD Taking Active   umeclidinium-vilanterol [facility] ELLIPTA) 62.5-25 MCG/INH AEPB [id number] No Inhale 1 puff into the lungs daily as needed. Russell Shave, MD Taking Active   valACYclovir (VALTREX) 500 MG tablet [id number] No Take 1 tablet (500 mg total) by mouth daily. Russell Shave, MD Taking Active           Patient Active Problem List   Diagnosis Date Noted  . Strain of right calf muscle 07/01/2020  . 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 COPD exacerbation ([place]) 03/20/2020  . Type 2 diabetes mellitus with diabetic peripheral angiopathy without gangrene, without long-term current use of insulin ([place]) 03/20/2020  . Coagulation defect ([place]) 12/11/2019  . Fatigue 12/10/2019  . Leg cramps 12/10/2019  . Mild epistaxis 10/30/2019  . Disc degeneration, 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  . Ganglion of right knee 09/27/2019  . Lumbar radiculopathy 09/27/2019  . Lower extremity edema   . 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 Acute respiratory failure ([place]) 07/27/2019  . Primary osteoarthritis of right knee 07/24/2019  . Breast tenderness in male 05/31/2019  .  Chronic pain 05/31/2019  . AKI (acute kidney injury) ([place]) 05/31/2019  . Pain due to onychomycosis of toenails of both feet 05/16/2019  . Chronic knee pain 04/16/2019  . Diabetes ([place]) 04/16/2019  . Esophageal reflux 02/06/2019  . Heartburn 02/06/2019  . Chronic skin ulcer of lower leg ([place]) 12/21/2018  . S/P drug eluting coronary stent placement   . Coronary artery disease involving native coronary artery of native heart with unstable angina pectoris ([place]) 12/20/2018  . Unstable angina ([place])   . 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 Acute on chronic diastolic heart failure ([place])   . Shortness of breath   . Precordial chest pain   . Idiopathic peripheral neuropathy   . Palpitations   . Chronic diastolic heart failure ([place])   . Abnormal  ankle brachial index (ABI)   . Chest pain, rule out acute myocardial infarction 09/26/2018  . History of cocaine abuse ([place]) 08/20/2018  . Marijuana use 08/20/2018  . Morbid obesity with BMI of 40.0-44.9, adult ([place]) 08/20/2018  . Dyspepsia   . Morbid obesity ([place])   . OSA on CPAP   . Chest pain 05/30/2018  . CAD (coronary artery disease) 03/30/2018  . Elevated troponin 02/03/2018  . Positive urine drug screen 02/03/2018  . Tobacco abuse 02/03/2018  . Hyperlipidemia 02/03/2018  . Acute respiratory failure with hypoxia ([place]) 02/02/2018  . Acute congestive heart failure ([place])   . Keratoconjunctivitis sicca of left eye not specified as Sjogren's 09/06/2017  . Long term current use of oral hypoglycemic drug 09/06/2017  . Mechanical ectropion of left lower eyelid 09/06/2017  . Nuclear sclerotic cataract of both eyes 09/06/2017  . Refractive amblyopia, left 09/06/2017  . Type 2 diabetes mellitus without complication, without long-term current use of insulin ([place]) 09/06/2017  . Hyperopia of both eyes with astigmatism and presbyopia 09/06/2017  . Atypical chest pain 12/27/2015  . Essential hypertension   . Neuropathy     Conditions to be addressed/monitored: Chronic Pain  Care Plan : Medication Management  Updates made by Lane Hacker, [place] since 07/07/2020 12:00 AM    Problem: Health Promotion or Disease Self-Management (General Plan of Care)     Goal: Medication Management   Note:   Current Barriers:  . Unable to independently monitor therapeutic efficacy . Unable to self administer medications as prescribed .   Pharmacist Clinical Goal(s):  Marland Kitchen Over the next 30 days, patient will achieve adherence to monitoring guidelines and medication adherence to achieve therapeutic efficacy through collaboration with PharmD and provider.  .   Interventions: . Inter-disciplinary care team collaboration (see longitudinal plan of care) . Comprehensive medication review performed;  medication list updated in electronic medical record  @[email or web address] @ Health Maintenance  Patient Goals/Self-Care Activities . Over the next 30 days, patient will:  - take medications as prescribed collaborate with provider on medication access solutions  Follow Up Plan: The care management team will reach out to the patient again over the next 30 days.     Task: Mutually Develop and Royce Macadamia Achievement of Patient Goals   Note:   Care Management Activities:    - verbalization of feelings encouraged    Notes:      Medication Assistance: None required. Patient affirms current coverage meets needs.   Follow up: Agree   Plan: The care management team will reach out to the patient again over the next 30 days.   [place] Constable, Pharm.D., Managed Medicaid Pharmacist - [phone]

## 2020-07-08 ENCOUNTER — Other Ambulatory Visit (HOSPITAL_COMMUNITY): Payer: Self-pay

## 2020-07-09 ENCOUNTER — Ambulatory Visit: Payer: Medicaid Other | Admitting: Orthopaedic Surgery

## 2020-07-09 ENCOUNTER — Other Ambulatory Visit (HOSPITAL_COMMUNITY): Payer: Self-pay

## 2020-07-10 ENCOUNTER — Telehealth: Payer: Self-pay | Admitting: Family Medicine

## 2020-07-10 DIAGNOSIS — I5032 Chronic diastolic (congestive) heart failure: Secondary | ICD-10-CM

## 2020-07-10 DIAGNOSIS — G4733 Obstructive sleep apnea (adult) (pediatric): Secondary | ICD-10-CM

## 2020-07-10 DIAGNOSIS — I10 Essential (primary) hypertension: Secondary | ICD-10-CM

## 2020-07-10 DIAGNOSIS — I5030 Unspecified diastolic (congestive) heart failure: Secondary | ICD-10-CM

## 2020-07-10 MED ORDER — ANORO ELLIPTA 62.5-25 MCG/INH IN AEPB: 1.0000 | INHALATION_SPRAY | Freq: Every day | RESPIRATORY_TRACT | 3 refills | Status: DC | PRN

## 2020-07-10 MED ORDER — ENALAPRIL MALEATE 20 MG PO TABS: 20.0000 mg | ORAL_TABLET | Freq: Every day | ORAL | 3 refills | Status: DC

## 2020-07-10 MED ORDER — APIXABAN 2.5 MG PO TABS: 2.5000 mg | ORAL_TABLET | Freq: Two times a day (BID) | ORAL | 11 refills | Status: DC

## 2020-07-10 MED ORDER — ISOSORBIDE MONONITRATE ER 30 MG PO TB24: 30.0000 mg | ORAL_TABLET | Freq: Every day | ORAL | 3 refills | Status: DC

## 2020-07-10 MED ORDER — PREGABALIN 100 MG PO CAPS: ORAL_CAPSULE | ORAL | 2 refills | Status: DC

## 2020-07-10 MED ORDER — VALACYCLOVIR HCL 500 MG PO TABS: 500.0000 mg | ORAL_TABLET | Freq: Every day | ORAL | 3 refills | Status: DC

## 2020-07-10 MED ORDER — BACLOFEN 10 MG PO TABS: 5.0000 mg | ORAL_TABLET | Freq: Three times a day (TID) | ORAL | 0 refills | Status: DC | PRN

## 2020-07-10 MED ORDER — ALBUTEROL SULFATE HFA 108 (90 BASE) MCG/ACT IN AERS: 2.0000 | INHALATION_SPRAY | Freq: Four times a day (QID) | RESPIRATORY_TRACT | 3 refills | Status: DC | PRN

## 2020-07-10 MED ORDER — ATORVASTATIN CALCIUM 80 MG PO TABS: 80.0000 mg | ORAL_TABLET | Freq: Every day | ORAL | 3 refills | Status: DC

## 2020-07-10 MED ORDER — FUROSEMIDE 40 MG PO TABS: 40.0000 mg | ORAL_TABLET | Freq: Two times a day (BID) | ORAL | 3 refills | Status: DC

## 2020-07-10 MED ORDER — POTASSIUM CHLORIDE CRYS ER 20 MEQ PO TBCR: EXTENDED_RELEASE_TABLET | ORAL | 3 refills | Status: DC

## 2020-07-10 MED ORDER — FLUTICASONE PROPIONATE 50 MCG/ACT NA SUSP: 1.0000 | Freq: Every day | NASAL | 11 refills | Status: DC

## 2020-07-10 MED ORDER — DAPAGLIFLOZIN PROPANEDIOL 10 MG PO TABS: 10.0000 mg | ORAL_TABLET | Freq: Every day | ORAL | 6 refills | Status: DC

## 2020-07-10 MED ORDER — PANTOPRAZOLE SODIUM 40 MG PO TBEC: 40.0000 mg | DELAYED_RELEASE_TABLET | Freq: Every day | ORAL | 0 refills | Status: DC

## 2020-07-10 NOTE — Telephone Encounter (Signed)
-----   Message from Lane Hacker, [facility] sent at 07/07/2020 12:37 PM EDT ----- Afternoon! My name is Jacqulyn Cane the PharmD on the MM team. I'm trying to get this gentleman's meds packaged. Right now, Cardio is sending a [place] out every 2 weeks but they're about to DC him from the service.  Can you send new scripts to his new Pharmacy so I can package everything for free?  Pantoprazole 40mg  Eliquis 5mg  Furosemide 40mg  Dapagliflozin 10mg  ISMN 30mg  Valacyclovir 500mg  MVI  KCl 81mEq Pregabalin 100mg  TID Atorvastatin 80mg  Enalapril 20mg  Baclofen 5mg  TID PRN Albuterol Anoro Accu-Chek Guide glucose Monitor   [facility] [place]   Thanks so much!!

## 2020-07-11 ENCOUNTER — Other Ambulatory Visit: Payer: Self-pay

## 2020-07-11 ENCOUNTER — Other Ambulatory Visit: Payer: Self-pay | Admitting: *Deleted

## 2020-07-11 ENCOUNTER — Telehealth: Payer: Self-pay

## 2020-07-11 ENCOUNTER — Encounter: Payer: Self-pay | Admitting: Orthopaedic Surgery

## 2020-07-11 ENCOUNTER — Ambulatory Visit (INDEPENDENT_AMBULATORY_CARE_PROVIDER_SITE_OTHER): Payer: Medicaid Other | Admitting: Orthopaedic Surgery

## 2020-07-11 DIAGNOSIS — M25561 Pain in right knee: Secondary | ICD-10-CM | POA: Diagnosis not present

## 2020-07-11 DIAGNOSIS — G8929 Other chronic pain: Secondary | ICD-10-CM

## 2020-07-11 NOTE — Patient Outreach (Signed)
Medicaid Managed Care   Nurse Care Manager Note  07/11/2020 Name:  Russell Collier MRN:  [id number] DOB:  [birth year]  Russell Collier is an 60 y.o. year old male who is a primary patient of Russell Collier.  The [facility] Managed Care Coordination team was consulted for assistance with:    DMII, knee pain and recent back surgery  Russell Collier was given information about Medicaid Managed Care Coordination team services today. Russell Collier agreed to services and verbal consent obtained.  Engaged with patient by telephone for initial visit in response to provider referral for case management and/or care coordination services.   Assessments/Interventions:  Review of past medical history, allergies, medications, health status, including review of consultants reports, laboratory and other test data, was performed as part of comprehensive evaluation and provision of chronic care management services.  SDOH (Social Determinants of Health) assessments and interventions performed:   Care Plan  Allergies  Allergen Reactions  . Varenicline Other (See Comments)    Patient reports laryngospasm stopped in the ER  . Bupropion Other (See Comments)    Headache - moderate/severe - self discontinued agent     Medications Reviewed Today    Reviewed by [name], RMA (Registered Medical Assistant) on 07/11/20 at 1023  Med List Status: <None>  Medication Order Taking? Sig Documenting Provider Last Dose Status Informant  Accu-Chek Softclix Lancets lancets [id number] No Use as instructed Russell Collier Taking Active   albuterol (PROVENTIL) (2.5 MG/3ML) 0.083% nebulizer solution [id number] No Take 2.5 mg by nebulization every 6 (six) hours as needed for wheezing or shortness of breath. Provider, Historical, Collier Taking Active Multiple Informants  albuterol (VENTOLIN HFA) 108 (90 Base) MCG/ACT inhaler [id number]  Inhale 2 puffs into the lungs every 6 (six) hours as needed for wheezing or shortness of  breath. Russell Collier  Active   apixaban (ELIQUIS) 2.5 MG TABS tablet [id number]  Take 1 tablet (2.5 mg total) by mouth 2 (two) times daily. Russell Collier  Active   atorvastatin (LIPITOR) 80 MG tablet [id number]  Take 1 tablet (80 mg total) by mouth daily. Russell Collier  Active   baclofen (LIORESAL) 10 MG tablet [id number]  Take 0.5-1 tablets (5-10 mg total) by mouth 3 (three) times daily as needed. Russell Collier  Active   Blood Glucose Monitoring Suppl (ACCU-CHEK GUIDE) w/Device KIT [id number] No 1 Units by Does not apply route daily. Russell Collier Taking Active   dapagliflozin propanediol (FARXIGA) 10 MG TABS tablet [id number]  Take 1 tablet (10 mg total) by mouth daily before breakfast. Russell Collier  Active   enalapril (VASOTEC) 20 MG tablet [id number]  Take 1 tablet (20 mg total) by mouth at bedtime. Russell Collier  Active   fluticasone [facility]) 50 MCG/ACT nasal spray [id number]  Place 1 spray into both nostrils daily. Russell Collier  Active   furosemide (LASIX) 40 MG tablet [id number]  Take 1 tablet (40 mg total) by mouth 2 (two) times daily. Russell Collier  Active   glucose blood (ACCU-CHEK GUIDE) test strip [id number] No Use as instructed Russell Collier Taking Active   isosorbide mononitrate (IMDUR) 30 MG 24 hr tablet [id number]  Take 1 tablet (30 mg total) by mouth daily. Russell Collier  Active   Multiple Vitamin (MULTIVITAMIN WITH MINERALS) TABS tablet [id number] No Take 1 tablet by mouth daily. Provider, Historical, Collier Taking Active   pantoprazole (PROTONIX) 40 MG tablet [id number]  Take 1 tablet (40 mg total)  by mouth daily. Russell Collier  Active   potassium chloride SA (KLOR-CON) 20 MEQ tablet [id number]  TAKE 1 TABLET(20 MEQ) BY MOUTH DAILY Russell Collier  Active   pregabalin (LYRICA) 100 MG capsule [id number]  TAKE ONE CAPSULE BY MOUTH THREE TIMES DAILY Russell Collier  Active   Tafamidis 61 MG CAPS [id number]  No TAKE 1 CAPSULE BY MOUTH DAILY. [name], [name], Collier Taking Active   umeclidinium-vilanterol [facility] ELLIPTA) 62.5-25 MCG/INH AEPB [id number]  Inhale 1 puff into the lungs daily as needed. Russell Collier  Active   valACYclovir (VALTREX) 500 MG tablet [id number]  Take 1 tablet (500 mg total) by mouth daily. Russell Collier  Active           Patient Active Problem List   Diagnosis Date Noted  . Strain of right calf muscle 07/01/2020  . 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 COPD exacerbation ([place]) 03/20/2020  . Type 2 diabetes mellitus with diabetic peripheral angiopathy without gangrene, without long-term current use of insulin ([place]) 03/20/2020  . Coagulation defect ([place]) 12/11/2019  . Fatigue 12/10/2019  . Leg cramps 12/10/2019  . Mild epistaxis 10/30/2019  . Disc degeneration, 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  . Ganglion of right knee 09/27/2019  . Lumbar radiculopathy 09/27/2019  . Lower extremity edema   . 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 Acute respiratory failure ([place]) 07/27/2019  . Primary osteoarthritis of right knee 07/24/2019  . Breast tenderness in male 05/31/2019  . Chronic pain 05/31/2019  . AKI (acute kidney injury) ([place]) 05/31/2019  . Pain due to onychomycosis of toenails of both feet 05/16/2019  . Chronic knee pain 04/16/2019  . Diabetes ([place]) 04/16/2019  . Esophageal reflux 02/06/2019  . Heartburn 02/06/2019  . Chronic skin ulcer of lower leg ([place]) 12/21/2018  . S/P drug eluting coronary stent placement   . Coronary artery disease involving native coronary artery of native heart with unstable angina pectoris ([place]) 12/20/2018  . Unstable angina ([place])   . 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 Acute on chronic diastolic heart failure ([place])   . Shortness of breath   . Precordial chest pain   . Idiopathic peripheral neuropathy   . Palpitations   . Chronic diastolic heart failure ([place])   . Abnormal ankle brachial index (ABI)   . Chest pain, rule out acute myocardial  infarction 09/26/2018  . History of cocaine abuse ([place]) 08/20/2018  . Marijuana use 08/20/2018  . Morbid obesity with BMI of 40.0-44.9, adult ([place]) 08/20/2018  . Dyspepsia   . Morbid obesity ([place])   . OSA on CPAP   . Chest pain 05/30/2018  . CAD (coronary artery disease) 03/30/2018  . Elevated troponin 02/03/2018  . Positive urine drug screen 02/03/2018  . Tobacco abuse 02/03/2018  . Hyperlipidemia 02/03/2018  . Acute respiratory failure with hypoxia ([place]) 02/02/2018  . Acute congestive heart failure ([place])   . Keratoconjunctivitis sicca of left eye not specified as Sjogren's 09/06/2017  . Long term current use of oral hypoglycemic drug 09/06/2017  . Mechanical ectropion of left lower eyelid 09/06/2017  . Nuclear sclerotic cataract of both eyes 09/06/2017  . Refractive amblyopia, left 09/06/2017  . Type 2 diabetes mellitus without complication, without long-term current use of insulin ([place]) 09/06/2017  . Hyperopia of both eyes with astigmatism and presbyopia 09/06/2017  . Atypical chest pain 12/27/2015  . Essential hypertension   . Neuropathy     Conditions to be addressed/monitored per PCP order:  DMII and knee pain and recent  back surgery  Care Plan : General Plan of Care (Adult)  Updates made by [name], RN since 07/11/2020 12:00 AM    Problem: Health Promotion or Disease Self-Management (General Plan of Care)     Long-Range Goal: Self-Management Plan Developed   Start Date: 07/11/2020  Expected End Date: 10/10/2020  This Visit's Progress: On track  Priority: Medium  Note:   Current Barriers:   Ineffective Self Health Maintenance-Mr. Sawyer is managing multiple health issues. He recently had back surgery and is having trouble sleeping due to needing a bed. He is managing right knee pain, in which he is waiting on the scheduling of an MRI. He would like to start PT and work on weight loss. He is managing diabetes with diet alone, recent A1C 6.0. He is wearing  compression socks and having a hard time putting them on. Mr. Chermak is working on smoking cessation and has cut back to 1/2 pack a day.  Lacks social connections  Unable to perform ADLs independently  Does not contact provider office for questions/concerns  Currently UNABLE TO independently self manage needs related to chronic health conditions.   Knowledge Deficits related to short term plan for care coordination needs and long term plans for chronic disease management needs Nurse Case Manager Clinical Goal(s):   patient will work with care management team to address care coordination and chronic disease management needs related to knee pain and recent back surgery   Interventions:   Evaluation of current treatment plan related to knee pain and patient's adherence to plan as established by provider.  Advised patient to follow up with referral for MRI, if not scheduled by 5/27, patient is to call providers office  Provided education to patient re: compression socks, diabetic diet and diet for gout  Discussed plans with patient for ongoing care management follow up and provided patient with direct contact information for care management team  Encouraged patient to contact [facility] for Healthy Rewards and [employer] vouchers Self Care Activities:  . Patient will self administer medications as prescribed . Patient will attend all scheduled provider appointments . Patient will call pharmacy for medication refills . Patient will call provider office for new concerns or questions Patient Goals: -- Call ([phone]) or go online [email or web address] for [name] and [name] vouchers offered by [employer] - schedule recommended health tests (blood work, mammogram, colonoscopy, pap test) - schedule and keep appointment for annual check-up  - work on maintaining a diabetic diet, compliant with gout  restrictions - Follow up on MRI referral, call Dr. Phoebe Sharps office if not scheduled by 07/18/20 Follow Up Plan: Telephone follow up appointment with care management team member scheduled for:08/14/20 @ 2:30pm      Follow Up:  Patient agrees to Care Plan and Follow-up.  Plan: The Managed Medicaid care management team will reach out to the patient again over the next 30 days.  Date/time of next scheduled RN care management/care coordination outreach: 08/14/20 @ 2:30pm  [name] RN, Rosa Sanchez RN Care Coordinator

## 2020-07-11 NOTE — Patient Instructions (Signed)
Visit Information  Russell Collier was given information about Medicaid Managed Care team care coordination services as a part of their [place] Medicaid benefit. Russell Collier verbally consented to engagement with the [facility] Managed Care team.   For questions related to your [facility], please call: [phone] or visit the homepage here: [email or web address]  If you would like to schedule transportation through your [facility], please call the following number at least 2 days in advance of your appointment: [phone].   Call the [place] at [phone], at any time, 24 hours a day, 7 days a week. If you are in danger or need immediate medical attention call 911.  Russell Collier - following are the goals we discussed in your visit today:  Goals Addressed            This Visit's Progress   . Protect My Health       Timeframe:  Long-Range Goal Priority:  Medium Start Date:    07/11/20                         Expected End Date:   09/10/20                  Follow Up Date 08/14/20   - Call ([phone]) or go online [email or web address] for [name] and [name] vouchers offered by [employer] - schedule recommended health tests (blood work, mammogram, colonoscopy, pap test) - schedule and keep appointment for annual check-up  - work on maintaining a diabetic diet, compliant with gout restrictions - Follow up on MRI referral, call Dr. Phoebe Sharps office if not scheduled by 07/18/20   Why is this important?    Screening tests can find diseases early when they are easier to treat.   Your doctor or nurse will talk with you about which tests are important for you.   Getting shots for common diseases like the flu and shingles will help prevent them.            Please see  education materials related to diet provided as print materials.     Diabetes Mellitus and Nutrition, Adult When you have diabetes, or diabetes mellitus, it is very important to have healthy eating habits because your blood sugar (glucose) levels are greatly affected by what you eat and drink. Eating healthy foods in the right amounts, at about the same times every day, can help you:  Control your blood glucose.  Lower your risk of heart disease.  Improve your blood pressure.  Reach or maintain a healthy weight. What can affect my meal plan? Every person with diabetes is different, and each person has different needs for a meal plan. Your health care provider may recommend that you work with a dietitian to make a meal plan that is best for you. Your meal plan may vary depending on factors such as:  The calories you need.  The medicines you take.  Your weight.  Your blood glucose, blood pressure, and cholesterol levels.  Your activity level.  Other health conditions you have, such as heart or kidney disease. How do carbohydrates affect me? Carbohydrates, also called carbs, affect your blood glucose level more than any other type of food. Eating carbs naturally raises the amount of glucose in your blood. Carb counting is a method for keeping track of how many carbs you eat. Counting carbs is important to keep your  blood glucose at a healthy level, especially if you use insulin or take certain oral diabetes medicines. It is important to know how many carbs you can safely have in each meal. This is different for every person. Your dietitian can help you calculate how many carbs you should have at each meal and for each snack. How does alcohol affect me? Alcohol can cause a sudden decrease in blood glucose (hypoglycemia), especially if you use insulin or take certain oral diabetes medicines. Hypoglycemia can be a life-threatening condition. Symptoms of hypoglycemia, such as sleepiness,  dizziness, and confusion, are similar to symptoms of having too much alcohol.  Do not drink alcohol if: ? Your health care provider tells you not to drink. ? You are pregnant, may be pregnant, or are planning to become pregnant.  If you drink alcohol: ? Do not drink on an empty stomach. ? Limit how much you use to:  0-1 drink a day for women.  0-2 drinks a day for men. ? Be aware of how much alcohol is in your drink. In the [place] one drink equals one 12 oz bottle of beer (355 mL), one 5 oz glass of wine (148 mL), or one 1 oz glass of hard liquor (44 mL). ? Keep yourself hydrated with water, diet soda, or unsweetened iced tea.  Keep in mind that regular soda, juice, and other mixers may contain a lot of sugar and must be counted as carbs. What are tips for following this plan? Reading food labels  Start by checking the serving size on the "Nutrition Facts" label of packaged foods and drinks. The amount of calories, carbs, fats, and other nutrients listed on the label is based on one serving of the item. Many items contain more than one serving per package.  Check the total grams (g) of carbs in one serving. You can calculate the number of servings of carbs in one serving by dividing the total carbs by 15. For example, if a food has 30 g of total carbs per serving, it would be equal to 2 servings of carbs.  Check the number of grams (g) of saturated fats and trans fats in one serving. Choose foods that have a low amount or none of these fats.  Check the number of milligrams (mg) of salt (sodium) in one serving. Most people should limit total sodium intake to less than 2,300 mg per day.  Always check the nutrition information of foods labeled as "low-fat" or "nonfat." These foods may be higher in added sugar or refined carbs and should be avoided.  Talk to your dietitian to identify your daily goals for nutrients listed on the label. Shopping  Avoid buying canned, pre-made, or  processed foods. These foods tend to be high in fat, sodium, and added sugar.  Shop around the outside edge of the grocery store. This is where you will most often find fresh fruits and vegetables, bulk grains, fresh meats, and fresh dairy. Cooking  Use low-heat cooking methods, such as baking, instead of high-heat cooking methods like deep frying.  Cook using healthy oils, such as olive, canola, or sunflower oil.  Avoid cooking with butter, cream, or high-fat meats. Meal planning  Eat meals and snacks regularly, preferably at the same times every day. Avoid going long periods of time without eating.  Eat foods that are high in fiber, such as fresh fruits, vegetables, beans, and whole grains. Talk with your dietitian about how many servings of carbs you can eat at  each meal.  Eat 4-6 oz (112-168 g) of lean protein each day, such as lean meat, chicken, fish, eggs, or tofu. One ounce (oz) of lean protein is equal to: ? 1 oz (28 g) of meat, chicken, or fish. ? 1 egg. ?  cup (62 g) of tofu.  Eat some foods each day that contain healthy fats, such as avocado, nuts, seeds, and fish.   What foods should I eat? Fruits Berries. Apples. Oranges. Peaches. Apricots. Plums. Grapes. Mango. Papaya. Pomegranate. Kiwi. Cherries. Vegetables Lettuce. Spinach. Leafy greens, including kale, chard, collard greens, and mustard greens. Beets. Cauliflower. Cabbage. Broccoli. Carrots. Green beans. Tomatoes. Peppers. Onions. Cucumbers. Brussels sprouts. Grains Whole grains, such as whole-wheat or whole-grain bread, crackers, tortillas, cereal, and pasta. Unsweetened oatmeal. Quinoa. Brown or wild rice. Meats and other proteins Seafood. Poultry without skin. Lean cuts of poultry and beef. Tofu. Nuts. Seeds. Dairy Low-fat or fat-free dairy products such as milk, yogurt, and cheese. The items listed above may not be a complete list of foods and beverages you can eat. Contact a dietitian for more  information. What foods should I avoid? Fruits Fruits canned with syrup. Vegetables Canned vegetables. Frozen vegetables with butter or cream sauce. Grains Refined white flour and flour products such as bread, pasta, snack foods, and cereals. Avoid all processed foods. Meats and other proteins Fatty cuts of meat. Poultry with skin. Breaded or fried meats. Processed meat. Avoid saturated fats. Dairy Full-fat yogurt, cheese, or milk. Beverages Sweetened drinks, such as soda or iced tea. The items listed above may not be a complete list of foods and beverages you should avoid. Contact a dietitian for more information. Questions to ask a health care provider  Do I need to meet with a diabetes educator?  Do I need to meet with a dietitian?  What number can I call if I have questions?  When are the best times to check my blood glucose? Where to find more information:  American Diabetes Association: diabetes.org  Academy of Nutrition and Dietetics: www.eatright.[employer] of Diabetes and Digestive and Kidney Diseases: CarFlippers.tn  Association of Diabetes Care and Education Specialists: www.diabeteseducator.org Summary  It is important to have healthy eating habits because your blood sugar (glucose) levels are greatly affected by what you eat and drink.  A healthy meal plan will help you control your blood glucose and maintain a healthy lifestyle.  Your health care provider may recommend that you work with a dietitian to make a meal plan that is best for you.  Keep in mind that carbohydrates (carbs) and alcohol have immediate effects on your blood glucose levels. It is important to count carbs and to use alcohol carefully. This information is not intended to replace advice given to you by your health care provider. Make sure you discuss any questions you have with your health care provider. Document Revised: 01/16/2019 Document Reviewed: 01/16/2019 Elsevier  Patient Education  2021 Elsevier Inc.    Gout  Gout is painful swelling of your joints. Gout is a type of arthritis. It is caused by having too much uric acid in your body. Uric acid is a chemical that is made when your body breaks down substances called purines. If your body has too much uric acid, sharp crystals can form and build up in your joints. This causes pain and swelling. Gout attacks can happen quickly and be very painful (acute gout). Over time, the attacks can affect more joints and happen more often (chronic gout). What  are the causes?  Too much uric acid in your blood. This can happen because: ? Your kidneys do not remove enough uric acid from your blood. ? Your body makes too much uric acid. ? You eat too many foods that are high in purines. These foods include organ meats, some seafood, and beer.  Trauma or stress. What increases the risk?  Having a family history of gout.  Being male and middle-aged.  Being male and having gone through menopause.  Being very overweight (obese).  Drinking alcohol, especially beer.  Not having enough water in the body (being dehydrated).  Losing weight too quickly.  Having an organ transplant.  Having lead poisoning.  Taking certain medicines.  Having kidney disease.  Having a skin condition called psoriasis. What are the signs or symptoms? An attack of acute gout usually happens in just one joint. The most common place is the big toe. Attacks often start at night. Other joints that may be affected include joints of the feet, ankle, knee, fingers, wrist, or elbow. Symptoms of an attack may include:  Very bad pain.  Warmth.  Swelling.  Stiffness.  Shiny, red, or purple skin.  Tenderness. The affected joint may be very painful to touch.  Chills and fever. Chronic gout may cause symptoms more often. More joints may be involved. You may also have white or yellow lumps (tophi) on your hands or feet or in other  areas near your joints.   How is this treated?  Treatment for this condition has two phases: treating an acute attack and preventing future attacks.  Acute gout treatment may include: ? NSAIDs. ? Steroids. These are taken by mouth or injected into a joint. ? Colchicine. This medicine relieves pain and swelling. It can be given by mouth or through an IV tube.  Preventive treatment may include: ? Taking small doses of NSAIDs or colchicine daily. ? Using a medicine that reduces uric acid levels in your blood. ? Making changes to your diet. You may need to see a food expert (dietitian) about what to eat and drink to prevent gout. Follow these instructions at home: During a gout attack  If told, put ice on the painful area: ? Put ice in a plastic bag. ? Place a towel between your skin and the bag. ? Leave the ice on for 20 minutes, 2-3 times a day.  Raise (elevate) the painful joint above the level of your heart as often as you can.  Rest the joint as much as possible. If the joint is in your leg, you may be given crutches.  Follow instructions from your doctor about what you cannot eat or drink.   Avoiding future gout attacks  Eat a low-purine diet. Avoid foods and drinks such as: ? Liver. ? Kidney. ? Anchovies. ? Asparagus. ? Herring. ? Mushrooms. ? Mussels. ? Beer.  Stay at a healthy weight. If you want to lose weight, talk with your doctor. Do not lose weight too fast.  Start or continue an exercise plan as told by your doctor. Eating and drinking  Drink enough fluids to keep your pee (urine) pale yellow.  If you drink alcohol: ? Limit how much you use to:  0-1 drink a day for women.  0-2 drinks a day for men. ? Be aware of how much alcohol is in your drink. In the [place] one drink equals one 12 oz bottle of beer (355 mL), one 5 oz glass of wine (148 mL), or one  1 oz glass of hard liquor (44 mL). General instructions  Take over-the-counter and prescription  medicines only as told by your doctor.  Do not drive or use heavy machinery while taking prescription pain medicine.  Return to your normal activities as told by your doctor. Ask your doctor what activities are safe for you.  Keep all follow-up visits as told by your doctor. This is important. Contact a doctor if:  You have another gout attack.  You still have symptoms of a gout attack after 10 days of treatment.  You have problems (side effects) because of your medicines.  You have chills or a fever.  You have burning pain when you pee (urinate).  You have pain in your lower back or belly. Get help right away if:  You have very bad pain.  Your pain cannot be controlled.  You cannot pee. Summary  Gout is painful swelling of the joints.  The most common site of pain is the big toe, but it can affect other joints.  Medicines and avoiding some foods can help to prevent and treat gout attacks. This information is not intended to replace advice given to you by your health care provider. Make sure you discuss any questions you have with your health care provider. Document Revised: 08/31/2017 Document Reviewed: 08/31/2017 Elsevier Patient Education  [place].   The patient verbalized understanding of instructions provided today and agreed to receive a mailed copy of patient instruction and/or educational materials.  Telephone follow up appointment with Managed Medicaid care management team member scheduled for:08/14/20 @ 2:30pm  [name] RN, BSN [place] RN Care Coordinator   Following is a copy of your plan of care:  Patient Care Plan: Medication Management    Problem Identified: Health Promotion or Disease Self-Management (General Plan of Care)     Goal: Medication Management   Note:   Current Barriers:  . Unable to independently monitor therapeutic efficacy . Unable to self administer medications as  prescribed .   Pharmacist Clinical Goal(s):  Marland Kitchen Over the next 30 days, patient will achieve adherence to monitoring guidelines and medication adherence to achieve therapeutic efficacy through collaboration with PharmD and provider.  .   Interventions: . Inter-disciplinary care team collaboration (see longitudinal plan of care) . Comprehensive medication review performed; medication list updated in electronic medical record  [email or web address] @ Health Maintenance  Patient Goals/Self-Care Activities . Over the next 30 days, patient will:  - take medications as prescribed collaborate with provider on medication access solutions  Follow Up Plan: The care management team will reach out to the patient again over the next 30 days.            Patient Care Plan: General Plan of Care (Adult)    Problem Identified: Health Promotion or Disease Self-Management (General Plan of Care)     Long-Range Goal: Self-Management Plan Developed   Start Date: 07/11/2020  Expected End Date: 10/10/2020  This Visit's Progress: On track  Priority: Medium  Note:   Current Barriers:   Ineffective Self Health Maintenance-Russell Collier is managing multiple health issues. He recently had back surgery and is having trouble sleeping due to needing a bed. He is managing right knee pain, in which he is waiting on the scheduling of an MRI. He would like to start PT and work on weight loss. He is managing diabetes with diet alone, recent A1C 6.0. He is wearing compression socks and having a hard time putting them  on. Russell Collier is working on smoking cessation and has cut back to 1/2 pack a day.  Lacks social connections  Unable to perform ADLs independently  Does not contact provider office for questions/concerns  Currently UNABLE TO independently self manage needs related to chronic health conditions.   Knowledge Deficits related to short term plan for care coordination needs and long term plans for chronic disease  management needs Nurse Case Manager Clinical Goal(s):   patient will work with care management team to address care coordination and chronic disease management needs related to knee pain and recent back surgery   Interventions:   Evaluation of current treatment plan related to knee pain and patient's adherence to plan as established by provider.  Advised patient to follow up with referral for MRI, if not scheduled by 5/27, patient is to call providers office  Provided education to patient re: compression socks, diabetic diet and diet for gout  Discussed plans with patient for ongoing care management follow up and provided patient with direct contact information for care management team  Encouraged patient to contact [facility] for Healthy Rewards and Weight [employer] vouchers Self Care Activities:  . Patient will self administer medications as prescribed . Patient will attend all scheduled provider appointments . Patient will call pharmacy for medication refills . Patient will call provider office for new concerns or questions Patient Goals: -- Call ([phone]) or go online [email or web address] for [name] and [name] vouchers offered by [employer] - schedule recommended health tests (blood work, mammogram, colonoscopy, pap test) - schedule and keep appointment for annual check-up  - work on maintaining a diabetic diet, compliant with gout restrictions - Follow up on MRI referral, call Dr. Phoebe Sharps office if not scheduled by 07/18/20 Follow Up Plan: Telephone follow up appointment with care management team member scheduled for:08/14/20 @ 2:30pm

## 2020-07-11 NOTE — Telephone Encounter (Signed)
[employer] calls nurse line reporting they still have not received the additional information requested to process DME order. Please addend 5/10 office note to reflect need for hospital bed. There is a fax in your box you can reference.
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
## 2020-07-14 NOTE — Telephone Encounter (Signed)
Called patient regarding hospital bed needs and provided questions.  He reports that he just "cannot get comfortable".  He reports that they were supposed to be lining 1 up for him after discharge from SNF but that has not happened.  I also reached out to [facility] and went through the documents that they provided.  It does not appear for my evaluation that the patient meets criteria required by [facility] for hospital bed.  Discussed this with the patient and he says that he will be reaching out to his spinal surgeon to see if he can order this bed for him.

## 2020-07-15 ENCOUNTER — Telehealth: Payer: Self-pay

## 2020-07-15 ENCOUNTER — Other Ambulatory Visit: Payer: Self-pay

## 2020-07-15 DIAGNOSIS — M25561 Pain in right knee: Secondary | ICD-10-CM

## 2020-07-15 DIAGNOSIS — G8929 Other chronic pain: Secondary | ICD-10-CM

## 2020-07-15 MED ORDER — ACCU-CHEK GUIDE W/DEVICE KIT: 1.0000 [IU] | PACK | Freq: Every day | 0 refills | Status: DC

## 2020-07-15 NOTE — Telephone Encounter (Signed)
Patient called regarding referral for mri, has the referral been sent? call back:[phone]

## 2020-07-15 NOTE — Telephone Encounter (Signed)
Called and spoke with patient. Ordered MRI as last office dictated.

## 2020-07-15 NOTE — Addendum Note (Signed)
Addended by: Concepcion Living on: 07/15/2020 02:03 PM   Modules accepted: Orders

## 2020-07-15 NOTE — Progress Notes (Signed)
Order for blood glucose monitoring device sent to patients new pharmacy.

## 2020-07-21 ENCOUNTER — Telehealth (HOSPITAL_COMMUNITY): Payer: Self-pay

## 2020-07-21 NOTE — Telephone Encounter (Signed)
Spoke to Russell Collier who reports he is out of town today and says he will be getting his medications delivered from [facility]. I will check in with him tomorrow on same. Call complete.

## 2020-07-22 ENCOUNTER — Telehealth (HOSPITAL_COMMUNITY): Payer: Self-pay

## 2020-07-22 NOTE — Patient Outreach (Signed)
Patient called and had questions about delivery of meds with [facility]. Since he lives on same block as previous Pharmacy, is potentially thinking about staying with previous. Told him that'd be fine and to let me know either way so we can transfer meds. Patient will think it over.

## 2020-07-22 NOTE — Telephone Encounter (Signed)
Spoke to [name] who will be delivering Russell Collier pill packs today. Russell Collier informed of same.

## 2020-07-22 NOTE — Telephone Encounter (Signed)
Spoke to Russell Collier today who received some medications from [facility] today in the bottles for 30 days supply. Except the following: -Eliquis -Enalapril -Furosemide -Pantoprazole  -Potassium -Pregabalin -Russell Collier and I reviewed medication list and confirmed what he should be taking daily until I can come out to fill a pill box. He agreed with plan. Call complete.

## 2020-07-25 ENCOUNTER — Telehealth: Payer: Self-pay | Admitting: Pharmacist

## 2020-07-25 NOTE — Telephone Encounter (Signed)
-----   Message from Leavy Cella, Aniwa sent at 06/25/2020  3:43 PM EDT ----- Regarding: Tobacco Reduction

## 2020-07-25 NOTE — Telephone Encounter (Signed)
Contacted patient RE tobacco reduction / cessation.   Patient reports continued smoking 10 cigs per day.   He states his pain is only slightly improved.  He shared that meloxicam is controlling his pain fairly well.  He is ambulating with a rolling walker.   After some discussion, patient shared that he may try cold-turkey attempt by stopping purchase.  He was not interested in any therapy for help.   He continues to be contemplative about a committed attempt at quitting.  Follow-up planned in 4-5 weeks.

## 2020-07-28 ENCOUNTER — Other Ambulatory Visit: Payer: Self-pay | Admitting: Family Medicine

## 2020-07-28 ENCOUNTER — Other Ambulatory Visit (HOSPITAL_COMMUNITY): Payer: Self-pay

## 2020-07-28 MED ORDER — PREGABALIN 100 MG PO CAPS: ORAL_CAPSULE | ORAL | 2 refills | Status: DC

## 2020-07-28 NOTE — Telephone Encounter (Signed)
Prescription sent

## 2020-07-28 NOTE — Progress Notes (Signed)
Paramedicine Encounter    Patient ID: Russell Collier, male    DOB: 12-23-1960, 60 y.o.   MRN: [id number]   Met with [name] today in the home for a medication box refill. Medications were verified. Pill box filled accordingly.   [name] needs the following refills: -Lasix -Eliquis -Enalapril   I will call these into [name] reports he found an old bottle of Gabapentin 355m and is taking them three times daily instead of the Pregabalin 1037mthree times daily. He reports it works better for his nerve pain. I educated him on the importance of taking whats prescribed and he verbalized he wants to use the stronger dose. I reported to him the difference in the medication and he still instists on using the Gabapentin. I will make his PCP aware.    I will return in one week for visit.     ACTION: Home visit completed

## 2020-07-28 NOTE — Telephone Encounter (Signed)
Prescribing DEA not being accepted by medicaid. Will forward to preceptor to fill.

## 2020-07-28 NOTE — Addendum Note (Signed)
Addended byWendy Poet, Neena Beecham D on: 07/28/2020 03:21 PM   Modules accepted: Orders

## 2020-07-28 NOTE — Telephone Encounter (Signed)
-----   Message from Lane Hacker, [facility] sent at 07/28/2020 11:28 AM EDT ----- Regarding: RE: Refill please My bad!   Lyrica 100mg  TID  Thanks!! ----- Message ----- From: [name], MD Sent: 07/24/2020   3:57 PM EDT To: Lane Hacker, [facility] Subject: RE: Refill please                              A refill for what? There are no requests for any refills. ----- Message ----- From: Lane Hacker, [facility] Sent: 07/24/2020   3:55 PM EDT To: [name], MD Subject: Refill please                                  Could you please send a new refill to a #NEW# Pharmacy. He's due on 07/30/20 and we'll deliver it then (He won't be filled early)  The Pharmacy is  [facility]  Thanks!

## 2020-07-28 NOTE — Addendum Note (Signed)
Addended by: Dorna Bloom on: 07/28/2020 03:00 PM   Modules accepted: Orders

## 2020-07-30 ENCOUNTER — Ambulatory Visit
Admission: RE | Admit: 2020-07-30 | Discharge: 2020-07-30 | Disposition: A | Discharge status: Home / Self Care | Payer: Medicaid Other | Source: Ambulatory Visit | Attending: Orthopaedic Surgery | Admitting: Orthopaedic Surgery

## 2020-07-30 DIAGNOSIS — M25561 Pain in right knee: Secondary | ICD-10-CM | POA: Diagnosis not present

## 2020-08-04 ENCOUNTER — Other Ambulatory Visit (HOSPITAL_COMMUNITY): Payer: Self-pay

## 2020-08-04 NOTE — Progress Notes (Signed)
Paramedicine Encounter    Patient ID: Russell Collier, male    DOB: 12/16/1960, 60 y.o.   MRN: [id number]   Patient Care Team: Cresenzo, Victor, MD as PCP - General (Family Medicine) Turner, Traci R, MD as PCP - Cardiology (Cardiology) Gross, Steven, MD as Consulting Physician (General Surgery) Schooler, Vincent, MD as Consulting Physician (Gastroenterology) Uris, Jenna H, LCSW as Social Worker (Licensed Clinical Social Worker) Patel, Donika K, DO as Consulting Physician (Neurology) Williams, Aaron, DC as Referring Physician (Chiropractic Medicine) Shields, Alexis J as Social Worker Kennedy, Nathan K, RPH as Pharmacist (Pharmacist) Robb, Melanie A, RN as Case Manager  Patient Active Problem List   Diagnosis Date Noted   Strain of right calf muscle 07/01/2020   Neuropathy, 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   COPD exacerbation (HCC) 03/20/2020   Type 2 diabetes mellitus with diabetic peripheral angiopathy without gangrene, without long-term current use of insulin (HCC) 03/20/2020   Coagulation defect (HCC) 12/11/2019   Fatigue 12/10/2019   Leg cramps 12/10/2019   Mild epistaxis 10/30/2019   Disc degeneration, 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   Ganglion of right knee 09/27/2019   Lumbar radiculopathy 09/27/2019   Lower extremity edema    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HCC)    Acute respiratory failure (HCC) 07/27/2019   Primary osteoarthritis of right knee 07/24/2019   Breast tenderness in male 05/31/2019   Chronic pain 05/31/2019   AKI (acute kidney injury) (HCC) 05/31/2019   Pain due to onychomycosis of toenails of both feet 05/16/2019   Chronic knee pain 04/16/2019   Diabetes (HCC) 04/16/2019   Esophageal reflux 02/06/2019   Heartburn 02/06/2019   Chronic skin ulcer of lower leg (HCC) 12/21/2018   S/P drug eluting coronary stent placement    Coronary artery disease involving native coronary artery of native heart with unstable angina pectoris (HCC) 12/20/2018   Unstable angina (HCC)    Laryngeal  spasm 10/17/2018   Acute on chronic diastolic heart failure (HCC)    Shortness of breath    Precordial chest pain    Idiopathic peripheral neuropathy    Palpitations    Chronic diastolic heart failure (HCC)    Abnormal ankle brachial index (ABI)    Chest pain, rule out acute myocardial infarction 09/26/2018   History of cocaine abuse (HCC) 08/20/2018   Marijuana use 08/20/2018   Morbid obesity with BMI of 40.0-44.9, adult (HCC) 08/20/2018   Dyspepsia    Morbid obesity (HCC)    OSA on CPAP    Chest pain 05/30/2018   CAD (coronary artery disease) 03/30/2018   Elevated troponin 02/03/2018   Positive urine drug screen 02/03/2018   Tobacco abuse 02/03/2018   Hyperlipidemia 02/03/2018   Acute respiratory failure with hypoxia (HCC) 02/02/2018   Acute congestive heart failure (HCC)    Keratoconjunctivitis sicca of left eye not specified as Sjogren's 09/06/2017   Long term current use of oral hypoglycemic drug 09/06/2017   Mechanical ectropion of left lower eyelid 09/06/2017   Nuclear sclerotic cataract of both eyes 09/06/2017   Refractive amblyopia, left 09/06/2017   Type 2 diabetes mellitus without complication, without long-term current use of insulin (HCC) 09/06/2017   Hyperopia of both eyes with astigmatism and presbyopia 09/06/2017   Atypical chest pain 12/27/2015   Essential hypertension    Neuropathy     Current Outpatient Medications:    Accu-Chek Softclix Lancets lancets, Use as instructed, Disp: 100 each, Rfl: 12   albuterol (PROVENTIL) (2.5 MG/3ML) 0.083% nebulizer solution, Take 2.5 mg by nebulization every 6 (six) hours as needed   for wheezing or shortness of breath., Disp: , Rfl:    albuterol (VENTOLIN HFA) 108 (90 Base) MCG/ACT inhaler, Inhale 2 puffs into the lungs every 6 (six) hours as needed for wheezing or shortness of breath., Disp: 18 g, Rfl: 3   apixaban (ELIQUIS) 2.5 MG TABS tablet, Take 1 tablet (2.5 mg total) by mouth 2 (two) times daily., Disp: 60 tablet, Rfl:  11   atorvastatin (LIPITOR) 80 MG tablet, Take 1 tablet (80 mg total) by mouth daily., Disp: 90 tablet, Rfl: 3   baclofen (LIORESAL) 10 MG tablet, Take 0.5-1 tablets (5-10 mg total) by mouth 3 (three) times daily as needed., Disp: 30 each, Rfl: 0   Blood Glucose Monitoring Suppl (ACCU-CHEK GUIDE) w/Device KIT, 1 Units by Does not apply route daily., Disp: 1 kit, Rfl: 0   dapagliflozin propanediol (FARXIGA) 10 MG TABS tablet, Take 1 tablet (10 mg total) by mouth daily before breakfast., Disp: 30 tablet, Rfl: 6   enalapril (VASOTEC) 20 MG tablet, Take 1 tablet (20 mg total) by mouth at bedtime., Disp: 90 tablet, Rfl: 3   fluticasone (FLONASE) 50 MCG/ACT nasal spray, Place 1 spray into both nostrils daily., Disp: 16 g, Rfl: 11   furosemide (LASIX) 40 MG tablet, Take 1 tablet (40 mg total) by mouth 2 (two) times daily., Disp: 180 tablet, Rfl: 3   glucose blood (ACCU-CHEK GUIDE) test strip, Use as instructed, Disp: 100 each, Rfl: 12   isosorbide mononitrate (IMDUR) 30 MG 24 hr tablet, Take 1 tablet (30 mg total) by mouth daily., Disp: 90 tablet, Rfl: 3   Multiple Vitamin (MULTIVITAMIN WITH MINERALS) TABS tablet, Take 1 tablet by mouth daily., Disp: , Rfl:    pantoprazole (PROTONIX) 40 MG tablet, Take 1 tablet (40 mg total) by mouth daily., Disp: 90 tablet, Rfl: 0   potassium chloride SA (KLOR-CON) 20 MEQ tablet, TAKE 1 TABLET(20 MEQ) BY MOUTH DAILY, Disp: 90 tablet, Rfl: 3   pregabalin (LYRICA) 100 MG capsule, TAKE ONE CAPSULE BY MOUTH THREE TIMES DAILY, Disp: 270 capsule, Rfl: 2   Tafamidis 61 MG CAPS, TAKE 1 CAPSULE BY MOUTH DAILY., Disp: 30 capsule, Rfl: 11   umeclidinium-vilanterol (ANORO ELLIPTA) 62.5-25 MCG/INH AEPB, Inhale 1 puff into the lungs daily as needed., Disp: 60 each, Rfl: 3   valACYclovir (VALTREX) 500 MG tablet, Take 1 tablet (500 mg total) by mouth daily., Disp: 90 tablet, Rfl: 3 Allergies  Allergen Reactions   Varenicline Other (See Comments)    Patient reports laryngospasm stopped  in the ER   Bupropion Other (See Comments)    Headache - moderate/severe - self discontinued agent      Social History   Socioeconomic History   Marital status: Divorced    Spouse name: Not on file   Number of children: 2   Years of education: 10   Highest education level: Not on file  Occupational History   Occupation: not employed  Tobacco Use   Smoking status: Every Day    Packs/day: 0.50    Years: 54.00    Pack years: 27.00    Types: Cigarettes, Cigars   Smokeless tobacco: Never   Tobacco comments:    half pack daily  Vaping Use   Vaping Use: Never used  Substance and Sexual Activity   Alcohol use: Yes    Alcohol/week: 4.0 standard drinks    Types: 2 Cans of beer, 2 Shots of liquor per week   Drug use: Not Currently    Comment: last use may last  year   Sexual activity: Yes  Other Topics Concern   Not on file  Social History Narrative   Right handed   Two story home   No caffeine   Social Determinants of Health   Financial Resource Strain: Not on file  Food Insecurity: Not on file  Transportation Needs: Not on file  Physical Activity: Not on file  Stress: Not on file  Social Connections: Not on file  Intimate Partner Violence: Not on file    Physical Exam Vitals reviewed.  Constitutional:      Appearance: Normal appearance. He is normal weight.  HENT:     Head: Normocephalic.     Nose: Nose normal.     Mouth/Throat:     Mouth: Mucous membranes are moist.  Eyes:     Conjunctiva/sclera: Conjunctivae normal.     Pupils: Pupils are equal, round, and reactive to light.  Cardiovascular:     Rate and Rhythm: Normal rate and regular rhythm.     Pulses: Normal pulses.     Heart sounds: Normal heart sounds.  Pulmonary:     Effort: Pulmonary effort is normal. No respiratory distress.     Breath sounds: Normal breath sounds.  Abdominal:     Palpations: Abdomen is soft.  Musculoskeletal:        General: No swelling. Normal range of motion.     Cervical  back: Normal range of motion.     Right lower leg: No edema.     Left lower leg: No edema.  Skin:    General: Skin is warm and dry.     Capillary Refill: Capillary refill takes less than 2 seconds.  Neurological:     General: No focal deficit present.     Mental Status: He is alert. Mental status is at baseline.  Psychiatric:        Mood and Affect: Mood normal.    Arrived for home visit for Aceson who was alert and oriented reporting to be feeling good with no complaints today. Calix has been complaint with his medications over the last week. I obtained vitals and they are as noted. Broox denied having any shortness of breath, difficulty breathing or chest pain, dizziness. I reviewed medications and filled pill box accordingly. Effie is getting pill bottles from [facility] currently and will be transitioning to pill packs on a roll dispenser in the coming months. Broady reminded of upcoming appointments. Home visit complete. I will see Ignace for a med rec in one week.   Refills: NONE    Future Appointments  Date Time Provider [place]  08/07/2020  9:15 AM Leandrew Koyanagi, MD [facility] None  08/07/2020  9:30 AM [facility] PHARMACIST [facility] None  08/14/2020  2:30 PM [facility] CARE MANAGER [facility] None  09/19/2020  2:15 PM Price, Christian Mate, DPM [facility]     ACTION: Home visit completed

## 2020-08-07 ENCOUNTER — Other Ambulatory Visit: Payer: Self-pay

## 2020-08-07 ENCOUNTER — Other Ambulatory Visit (HOSPITAL_COMMUNITY): Payer: Self-pay

## 2020-08-07 ENCOUNTER — Encounter: Payer: Self-pay | Admitting: Orthopaedic Surgery

## 2020-08-07 ENCOUNTER — Ambulatory Visit (INDEPENDENT_AMBULATORY_CARE_PROVIDER_SITE_OTHER): Payer: Medicaid Other | Admitting: Orthopaedic Surgery

## 2020-08-07 DIAGNOSIS — M1711 Unilateral primary osteoarthritis, right knee: Secondary | ICD-10-CM

## 2020-08-07 MED FILL — Tafamidis Cap 61 MG: ORAL | 30 days supply | Qty: 30 | Fill #2 | Status: AC
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
## 2020-08-07 NOTE — Patient Outreach (Signed)
Medicaid Managed Care    Pharmacy Note  08/07/2020 Name: Russell Collier MRN: [id number] DOB: Jun 27, 1960  Russell Collier is a 60 y.o. year old male who is a primary care patient of [name], MD. The [facility] Managed Care Coordination team was consulted for assistance with disease management and care coordination needs.    Engaged with patient Engaged with patient by telephone for follow up visit in response to referral for case management and/or care coordination services.  Russell Collier was given information about Managed Medicaid Care Coordination team services today. Russell Collier agreed to services and verbal consent obtained.   Objective:  Lab Results  Component Value Date   CREATININE 1.75 (H) 03/25/2020   CREATININE 1.58 (H) 12/10/2019   CREATININE 1.52 (H) 08/14/2019    Lab Results  Component Value Date   HGBA1C 6.0 (A) 03/05/2020       Component Value Date/Time   CHOL 125 03/05/2020 1623   TRIG 82 03/05/2020 1623   HDL 33 (L) 03/05/2020 1623   CHOLHDL 3.8 03/05/2020 1623   CHOLHDL 3.3 07/28/2019 0633   VLDL 15 07/28/2019 0633   LDLCALC 76 03/05/2020 1623    Other: (TSH, CBC, Vit D, etc.)  Clinical ASCVD: Yes  The ASCVD Risk score Russell Collier., et al., 2013) failed to calculate for the following reasons:   The valid total cholesterol range is 130 to 320 mg/dL    Other: (CHADS2VASc if Afib, PHQ9 if depression, MMRC or CAT for COPD, ACT, DEXA)  BP Readings from Last 3 Encounters:  08/04/20 140/86  07/07/20 (!) 150/90  07/01/20 128/64    Assessment/Interventions: Review of patient past medical history, allergies, medications, health status, including review of consultants reports, laboratory and other test data, was performed as part of comprehensive evaluation and provision of chronic care management services.   Sleep: -Patient just Russell Collier from Nursing home for 3 months post spinal procedure. He's living with his cousin and sleeping on a mattress that's  more cardboard than foam. May 2022 Plan: This is patient's main priority, was not interested in speaking about meds. Coordinated with [name] and [name] to help June 2022: [name] is working to help patient  [name], from [name], [phone], conducts a home visit Q 2 weeks where she also packs meds. She's about to conduct her last visit today May 2022 Plan: Spoke with patient and [name], would like to transition to [facility] once she's done with meds Verbal consent obtained for [facility] enhanced pharmacy services (medication synchronization, adherence packaging, delivery coordination). A medication sync plan was created to allow patient to get all medications delivered once every 30 to 90 days per patient preference. Patient understands they have freedom to choose pharmacy and clinical pharmacist will coordinate care between all prescribers and [facility]. June 2022: Patient was on way to doctor appt when we spoke, didn't have meds in front of him to go over today. But he states he's very content with new process, he's OK waiting in regards to the vials changing to packs because he enjoys spending time with [name]    SDOH (Social Determinants of Health) assessments and interventions performed:    Care Plan  Allergies  Allergen Reactions   Varenicline Other (See Comments)    Patient reports laryngospasm stopped in the ER   Bupropion Other (See Comments)    Headache - moderate/severe - self discontinued agent     Medications Reviewed Today     Reviewed by [name], RMA (Registered Medical Assistant)  on 08/07/20 at [name]  Med List Status: <None>   Medication Order Taking? Sig Documenting Provider Last Dose Status Informant  Accu-Chek Softclix Lancets lancets [id number] No Use as instructed [name], MD Taking Active   albuterol (PROVENTIL) (2.5 MG/3ML) 0.083% nebulizer solution [id number] No Take 2.5 mg by nebulization every 6 (six) hours as needed for  wheezing or shortness of breath. [provider] Taking Active Multiple Informants  albuterol (VENTOLIN HFA) 108 (90 Base) MCG/ACT inhaler [id number] No Inhale 2 puffs into the lungs every 6 (six) hours as needed for wheezing or shortness of breath. [name], MD Taking Active   apixaban (ELIQUIS) 2.5 MG TABS tablet [id number] No Take 1 tablet (2.5 mg total) by mouth 2 (two) times daily. [name], MD Taking Active   atorvastatin (LIPITOR) 80 MG tablet [id number] No Take 1 tablet (80 mg total) by mouth daily. [name], MD Taking Active   baclofen (LIORESAL) 10 MG tablet [id number] No Take 0.5-1 tablets (5-10 mg total) by mouth 3 (three) times daily as needed. [name], MD Taking Active   Blood Glucose Monitoring Suppl (ACCU-CHEK GUIDE) w/Device KIT [id number] No 1 Units by Does not apply route daily. [name], MD Taking Active   dapagliflozin propanediol (FARXIGA) 10 MG TABS tablet [id number] No Take 1 tablet (10 mg total) by mouth daily before breakfast. [name], MD Taking Active   enalapril (VASOTEC) 20 MG tablet [id number] No Take 1 tablet (20 mg total) by mouth at bedtime. [name], MD Taking Active   fluticasone [facility]) 50 MCG/ACT nasal spray [id number] No Place 1 spray into both nostrils daily. [name], MD Taking Active   furosemide (LASIX) 40 MG tablet [id number] No Take 1 tablet (40 mg total) by mouth 2 (two) times daily. [name], MD Taking Active   glucose blood (ACCU-CHEK GUIDE) test strip [id number] No Use as instructed [name], MD Taking Active   isosorbide mononitrate (IMDUR) 30 MG 24 hr tablet [id number] No Take 1 tablet (30 mg total) by mouth daily. [name], MD Taking Active   Multiple Vitamin (MULTIVITAMIN WITH MINERALS) TABS tablet [id number] No Take 1 tablet by mouth daily. [provider] Taking Active   pantoprazole (PROTONIX) 40 MG tablet [id number] No Take 1 tablet (40 mg total) by  mouth daily. [name], MD Taking Active   potassium chloride SA (KLOR-CON) 20 MEQ tablet [id number] No TAKE 1 TABLET(20 MEQ) BY MOUTH DAILY [name], MD Taking Active   pregabalin (LYRICA) 100 MG capsule [id number] No TAKE ONE CAPSULE BY MOUTH THREE TIMES DAILY McDiarmid, Blane Ohara, MD Taking Active   Tafamidis 61 MG CAPS [id number] No TAKE 1 CAPSULE BY MOUTH DAILY. [name], [name], MD Taking Active   umeclidinium-vilanterol [facility] ELLIPTA) 62.5-25 MCG/INH AEPB [id number] No Inhale 1 puff into the lungs daily as needed. [name], MD Taking Active   valACYclovir (VALTREX) 500 MG tablet [id number] No Take 1 tablet (500 mg total) by mouth daily. 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   COPD exacerbation ([place]) 03/20/2020   Type 2 diabetes mellitus with diabetic peripheral angiopathy without gangrene, without long-term current use of insulin ([place]) 03/20/2020   Coagulation defect ([place]) 12/11/2019   Fatigue 12/10/2019   Leg cramps 12/10/2019   Mild epistaxis 10/30/2019   Disc degeneration, 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  Ganglion of right knee 09/27/2019   Lumbar radiculopathy 09/27/2019   Lower extremity edema    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HCC)    Acute respiratory failure ([place]) 07/27/2019   Primary osteoarthritis of right knee 07/24/2019   Breast tenderness in male 05/31/2019   Chronic pain 05/31/2019   AKI (acute kidney injury) ([place]) 05/31/2019   Pain due to onychomycosis of toenails of both feet 05/16/2019   Chronic knee pain 04/16/2019   Diabetes ([place]) 04/16/2019   Esophageal reflux 02/06/2019   Heartburn 02/06/2019   Chronic skin ulcer of lower leg ([place]) 12/21/2018   S/P drug eluting coronary stent placement    Coronary artery disease involving native coronary artery of native heart with unstable angina pectoris ([place]) 12/20/2018    Unstable angina (HCC)    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   Acute on chronic diastolic heart failure (HCC)    Shortness of breath    Precordial chest pain    Idiopathic peripheral neuropathy    Palpitations    Chronic diastolic heart failure (HCC)    Abnormal ankle brachial index (ABI)    Chest pain, rule out acute myocardial infarction 09/26/2018   History of cocaine abuse ([place]) 08/20/2018   Marijuana use 08/20/2018   Morbid obesity with BMI of 40.0-44.9, adult ([place]) 08/20/2018   Dyspepsia    Morbid obesity (HCC)    OSA on CPAP    Chest pain 05/30/2018   CAD (coronary artery disease) 03/30/2018   Elevated troponin 02/03/2018   Positive urine drug screen 02/03/2018   Tobacco abuse 02/03/2018   Hyperlipidemia 02/03/2018   Acute respiratory failure with hypoxia ([place]) 02/02/2018   Acute congestive heart failure (HCC)    Keratoconjunctivitis sicca of left eye not specified as Sjogren's 09/06/2017   Long term current use of oral hypoglycemic drug 09/06/2017   Mechanical ectropion of left lower eyelid 09/06/2017   Nuclear sclerotic cataract of both eyes 09/06/2017   Refractive amblyopia, left 09/06/2017   Type 2 diabetes mellitus without complication, without long-term current use of insulin ([place]) 09/06/2017   Hyperopia of both eyes with astigmatism and presbyopia 09/06/2017   Atypical chest pain 12/27/2015   Essential hypertension    Neuropathy     Conditions to be addressed/monitored:  Chronic Pain  Care Plan : Medication Management  Updates made by Lane Hacker, [place] since 07/07/2020 12:00 AM     Problem: Health Promotion or Disease Self-Management (General Plan of Care)      Goal: Medication Management   Note:   Current Barriers:  Unable to independently monitor therapeutic efficacy Unable to self administer medications as prescribed   Pharmacist Clinical Goal(s):  Over the next 30 days, patient will achieve adherence to monitoring guidelines and medication adherence  to achieve therapeutic efficacy through collaboration with PharmD and provider.    Interventions: Inter-disciplinary care team collaboration (see longitudinal plan of care) Comprehensive medication review performed; medication list updated in electronic medical record  @[email or web address] @ Health Maintenance  Patient Goals/Self-Care Activities Over the next 30 days, patient will:  - take medications as prescribed collaborate with provider on medication access solutions  Follow Up Plan: The care management team will reach out to the patient again over the next 30 days.      Task: Mutually Develop and Royce Macadamia Achievement of Patient Goals   Note:   Care Management Activities:    - verbalization of feelings encouraged    Notes:      Medication Assistance: None required. Patient affirms current coverage meets needs.  Follow up: Agree   Plan: The care management team will reach out to the patient again over the next 30 days.   [place] Constable, Pharm.D., Managed Medicaid Pharmacist - [phone]

## 2020-08-07 NOTE — Patient Instructions (Signed)
Visit Information  Russell Collier was given information about Medicaid Managed Care team care coordination services as a part of their Why Medicaid benefit. Russell Collier verbally consented to engagement with the [facility] Managed Care team.   For questions related to your [facility], please call: [phone] or visit the homepage here: [email or web address]  If you would like to schedule transportation through your [facility], please call the following number at least 2 days in advance of your appointment: [phone].   Call the [facility] at [phone], at any time, 24 hours a day, 7 days a week. If you are in danger or need immediate medical attention call 911.  Russell Collier - following are the goals we discussed in your visit today:   Goals Addressed   None     Please see education materials related to HTN provided as print materials.   Patient verbalizes understanding of instructions provided today.   The Managed Medicaid care management team will reach out to the patient again over the next 30 days.   Russell Collier, Pharm.D., Managed Medicaid Pharmacist [phone]   Following is a copy of your plan of care:  Patient Care Plan: Medication Management     Problem Identified: Health Promotion or Disease Self-Management (General Plan of Care)      Goal: Medication Management   Note:   Current Barriers:  Unable to independently monitor therapeutic efficacy Unable to self administer medications as prescribed   Pharmacist Clinical Goal(s):  Over the next 30 days, patient will achieve adherence to monitoring guidelines and medication adherence to achieve therapeutic efficacy through collaboration with PharmD and provider.    Interventions: Inter-disciplinary care team collaboration (see longitudinal plan of  care) Comprehensive medication review performed; medication list updated in electronic medical record  @[email or web address] @ Health Maintenance  Patient Goals/Self-Care Activities Over the next 30 days, patient will:  - take medications as prescribed collaborate with provider on medication access solutions  Follow Up Plan: The care management team will reach out to the patient again over the next 30 days.      Task: Mutually Develop and Russell Collier Achievement of Patient Goals   Note:   Care Management Activities:    - verbalization of feelings encouraged    Notes:     Patient Care Plan: General Plan of Care (Adult)     Problem Identified: Health Promotion or Disease Self-Management (General Plan of Care)      Long-Range Goal: Self-Management Plan Developed   Start Date: 07/11/2020  Expected End Date: 10/10/2020  This Visit's Progress: On track  Priority: Medium  Note:   Current Barriers:  Ineffective Self Health Maintenance-Russell Collier is managing multiple health issues. He recently had back surgery and is having trouble sleeping due to needing a bed. He is managing right knee pain, in which he is waiting on the scheduling of an MRI. He would like to start PT and work on weight loss. He is managing diabetes with diet alone, recent A1C 6.0. He is wearing compression socks and having a hard time putting them on. Russell Collier is working on smoking cessation and has cut back to 1/2 pack a day. Lacks social connections Unable to perform ADLs independently Does not contact provider office for questions/concerns Currently UNABLE TO independently self manage needs related to chronic health conditions.  Knowledge Deficits related to short term plan for care coordination needs and long term plans for chronic disease management  needs Nurse Case Manager Clinical Goal(s):  patient will work with care management team to address care coordination and chronic disease management needs related to knee  pain and recent back surgery   Interventions:  Evaluation of current treatment plan related to knee pain and patient's adherence to plan as established by provider. Advised patient to follow up with referral for MRI, if not scheduled by 5/27, patient is to call providers office Provided education to patient re: compression socks, diabetic diet and diet for gout Discussed plans with patient for ongoing care management follow up and provided patient with direct contact information for care management team Encouraged patient to contact [facility] for Healthy Rewards and [employer] vouchers Self Care Activities:  Patient will self administer medications as prescribed Patient will attend all scheduled provider appointments Patient will call pharmacy for medication refills Patient will call provider office for new concerns or questions Patient Goals: -- Call ([phone]) or go online [email or web address] for [name] and [name] vouchers offered by [employer] - schedule recommended health tests (blood work, mammogram, colonoscopy, pap test) - schedule and keep appointment for annual check-up  - work on maintaining a diabetic diet, compliant with gout restrictions - Follow up on MRI referral, call Dr. Phoebe Sharps office if not scheduled by 07/18/20 Follow Up Plan: Telephone follow up appointment with care management team member scheduled for:08/14/20 @ 2:30pm

## 2020-08-11 ENCOUNTER — Other Ambulatory Visit (HOSPITAL_COMMUNITY): Payer: Self-pay

## 2020-08-11 ENCOUNTER — Other Ambulatory Visit: Payer: Self-pay

## 2020-08-11 NOTE — Progress Notes (Signed)
Paramedicine Encounter    Patient ID: Russell Collier, male    DOB: 07/06/1960, 60 y.o.   MRN: [id number]   Patient Care Team: Cresenzo, Victor, MD as PCP - General (Family Medicine) Turner, Traci R, MD as PCP - Cardiology (Cardiology) Gross, Steven, MD as Consulting Physician (General Surgery) Schooler, Vincent, MD as Consulting Physician (Gastroenterology) Uris, Jenna H, LCSW as Social Worker (Licensed Clinical Social Worker) Patel, Donika K, DO as Consulting Physician (Neurology) Williams, Aaron, DC as Referring Physician (Chiropractic Medicine) Shields, Alexis J as Social Worker Kennedy, Nathan K, RPH as Pharmacist (Pharmacist) Robb, Melanie A, RN as Case Manager  Patient Active Problem List   Diagnosis Date Noted   Strain of right calf muscle 07/01/2020   Neuropathy, 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   COPD exacerbation (HCC) 03/20/2020   Type 2 diabetes mellitus with diabetic peripheral angiopathy without gangrene, without long-term current use of insulin (HCC) 03/20/2020   Coagulation defect (HCC) 12/11/2019   Fatigue 12/10/2019   Leg cramps 12/10/2019   Mild epistaxis 10/30/2019   Disc degeneration, 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   Ganglion of right knee 09/27/2019   Lumbar radiculopathy 09/27/2019   Lower extremity edema    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HCC)    Acute respiratory failure (HCC) 07/27/2019   Primary osteoarthritis of right knee 07/24/2019   Breast tenderness in male 05/31/2019   Chronic pain 05/31/2019   AKI (acute kidney injury) (HCC) 05/31/2019   Pain due to onychomycosis of toenails of both feet 05/16/2019   Chronic knee pain 04/16/2019   Diabetes (HCC) 04/16/2019   Esophageal reflux 02/06/2019   Heartburn 02/06/2019   Chronic skin ulcer of lower leg (HCC) 12/21/2018   S/P drug eluting coronary stent placement    Coronary artery disease involving native coronary artery of native heart with unstable angina pectoris (HCC) 12/20/2018   Unstable angina (HCC)    Laryngeal  spasm 10/17/2018   Acute on chronic diastolic heart failure (HCC)    Shortness of breath    Precordial chest pain    Idiopathic peripheral neuropathy    Palpitations    Chronic diastolic heart failure (HCC)    Abnormal ankle brachial index (ABI)    Chest pain, rule out acute myocardial infarction 09/26/2018   History of cocaine abuse (HCC) 08/20/2018   Marijuana use 08/20/2018   Morbid obesity with BMI of 40.0-44.9, adult (HCC) 08/20/2018   Dyspepsia    Morbid obesity (HCC)    OSA on CPAP    Chest pain 05/30/2018   CAD (coronary artery disease) 03/30/2018   Elevated troponin 02/03/2018   Positive urine drug screen 02/03/2018   Tobacco abuse 02/03/2018   Hyperlipidemia 02/03/2018   Acute respiratory failure with hypoxia (HCC) 02/02/2018   Acute congestive heart failure (HCC)    Keratoconjunctivitis sicca of left eye not specified as Sjogren's 09/06/2017   Long term current use of oral hypoglycemic drug 09/06/2017   Mechanical ectropion of left lower eyelid 09/06/2017   Nuclear sclerotic cataract of both eyes 09/06/2017   Refractive amblyopia, left 09/06/2017   Type 2 diabetes mellitus without complication, without long-term current use of insulin (HCC) 09/06/2017   Hyperopia of both eyes with astigmatism and presbyopia 09/06/2017   Atypical chest pain 12/27/2015   Essential hypertension    Neuropathy     Current Outpatient Medications:    Accu-Chek Softclix Lancets lancets, Use as instructed, Disp: 100 each, Rfl: 12   albuterol (PROVENTIL) (2.5 MG/3ML) 0.083% nebulizer solution, Take 2.5 mg by nebulization every 6 (six) hours as needed   for wheezing or shortness of breath., Disp: , Rfl:    albuterol (VENTOLIN HFA) 108 (90 Base) MCG/ACT inhaler, Inhale 2 puffs into the lungs every 6 (six) hours as needed for wheezing or shortness of breath., Disp: 18 g, Rfl: 3   apixaban (ELIQUIS) 2.5 MG TABS tablet, Take 1 tablet (2.5 mg total) by mouth 2 (two) times daily., Disp: 60 tablet, Rfl:  11   atorvastatin (LIPITOR) 80 MG tablet, Take 1 tablet (80 mg total) by mouth daily., Disp: 90 tablet, Rfl: 3   baclofen (LIORESAL) 10 MG tablet, Take 0.5-1 tablets (5-10 mg total) by mouth 3 (three) times daily as needed., Disp: 30 each, Rfl: 0   Blood Glucose Monitoring Suppl (ACCU-CHEK GUIDE) w/Device KIT, 1 Units by Does not apply route daily., Disp: 1 kit, Rfl: 0   dapagliflozin propanediol (FARXIGA) 10 MG TABS tablet, Take 1 tablet (10 mg total) by mouth daily before breakfast., Disp: 30 tablet, Rfl: 6   enalapril (VASOTEC) 20 MG tablet, Take 1 tablet (20 mg total) by mouth at bedtime., Disp: 90 tablet, Rfl: 3   fluticasone (FLONASE) 50 MCG/ACT nasal spray, Place 1 spray into both nostrils daily., Disp: 16 g, Rfl: 11   furosemide (LASIX) 40 MG tablet, Take 1 tablet (40 mg total) by mouth 2 (two) times daily., Disp: 180 tablet, Rfl: 3   glucose blood (ACCU-CHEK GUIDE) test strip, Use as instructed, Disp: 100 each, Rfl: 12   isosorbide mononitrate (IMDUR) 30 MG 24 hr tablet, Take 1 tablet (30 mg total) by mouth daily., Disp: 90 tablet, Rfl: 3   Multiple Vitamin (MULTIVITAMIN WITH MINERALS) TABS tablet, Take 1 tablet by mouth daily., Disp: , Rfl:    pantoprazole (PROTONIX) 40 MG tablet, Take 1 tablet (40 mg total) by mouth daily., Disp: 90 tablet, Rfl: 0   potassium chloride SA (KLOR-CON) 20 MEQ tablet, TAKE 1 TABLET(20 MEQ) BY MOUTH DAILY, Disp: 90 tablet, Rfl: 3   pregabalin (LYRICA) 100 MG capsule, TAKE ONE CAPSULE BY MOUTH THREE TIMES DAILY, Disp: 270 capsule, Rfl: 2   Tafamidis 61 MG CAPS, TAKE 1 CAPSULE BY MOUTH DAILY., Disp: 30 capsule, Rfl: 11   umeclidinium-vilanterol (ANORO ELLIPTA) 62.5-25 MCG/INH AEPB, Inhale 1 puff into the lungs daily as needed., Disp: 60 each, Rfl: 3   valACYclovir (VALTREX) 500 MG tablet, Take 1 tablet (500 mg total) by mouth daily., Disp: 90 tablet, Rfl: 3 Allergies  Allergen Reactions   Varenicline Other (See Comments)    Patient reports laryngospasm stopped  in the ER   Bupropion Other (See Comments)    Headache - moderate/severe - self discontinued agent      Social History   Socioeconomic History   Marital status: Divorced    Spouse name: Not on file   Number of children: 2   Years of education: 10   Highest education level: Not on file  Occupational History   Occupation: not employed  Tobacco Use   Smoking status: Every Day    Packs/day: 0.50    Years: 54.00    Pack years: 27.00    Types: Cigarettes, Cigars   Smokeless tobacco: Never   Tobacco comments:    half pack daily  Vaping Use   Vaping Use: Never used  Substance and Sexual Activity   Alcohol use: Yes    Alcohol/week: 4.0 standard drinks    Types: 2 Cans of beer, 2 Shots of liquor per week   Drug use: Not Currently    Comment: last use may last  year   Sexual activity: Yes  Other Topics Concern   Not on file  Social History Narrative   Right handed   Two story home   No caffeine   Social Determinants of Health   Financial Resource Strain: Not on file  Food Insecurity: Not on file  Transportation Needs: Not on file  Physical Activity: Not on file  Stress: Not on file  Social Connections: Not on file  Intimate Partner Violence: Not on file    Physical Exam Vitals reviewed.  Constitutional:      Appearance: Normal appearance. He is normal weight.  HENT:     Head: Normocephalic.     Nose: Nose normal.     Mouth/Throat:     Mouth: Mucous membranes are moist.     Pharynx: Oropharynx is clear.  Eyes:     Conjunctiva/sclera: Conjunctivae normal.     Pupils: Pupils are equal, round, and reactive to light.  Cardiovascular:     Rate and Rhythm: Normal rate and regular rhythm.     Pulses: Normal pulses.     Heart sounds: Normal heart sounds.  Pulmonary:     Effort: Pulmonary effort is normal.     Breath sounds: Normal breath sounds.  Abdominal:     General: Abdomen is flat.     Palpations: Abdomen is soft.  Musculoskeletal:        General: Normal  range of motion.     Cervical back: Normal range of motion.     Right lower leg: No edema.     Left lower leg: No edema.  Skin:    General: Skin is warm and dry.  Neurological:     General: No focal deficit present.     Mental Status: He is alert. Mental status is at baseline.  Psychiatric:        Mood and Affect: Mood normal.   Arrived for home visit for Russell Collier who was alert and oriented sitting in a chair on the front porch. Russell Collier reports feeling good with no complaints. Russell Collier denied any shortness of breath, chest pain or dizziness. Russell Collier was able to walk up the stairs using his cane with no increased shortness of breath and no complaints of pain. Russell Collier has been compliant with medications over the last week. I reviewed meds and filled pill box accordingly. Vitals were as noted and assessment obtained. Lung sounds clear. Some slight pedal edema, but wearing compression stockings and he reports he had some salty foods over the weekend. He states he will be back on track with his diet today. I reviewed appointments with Russell Collier and home visit was completed. I will see him in one week for a med rec.   Refills: Valtrex Russell Collier      Future Appointments  Date Time Provider [place]  08/14/2020  2:30 PM [facility] CARE MANAGER [facility] None  08/19/2020 10:45 AM Mathis Dad, PT [facility]  09/19/2020  2:15 PM Price, Christian Mate, DPM [facility]     ACTION: Home visit completed

## 2020-08-14 ENCOUNTER — Other Ambulatory Visit: Payer: Self-pay | Admitting: *Deleted

## 2020-08-14 NOTE — Patient Outreach (Signed)
Care Coordination  08/14/2020  [name] [birth year] [id number]   Medicaid Managed Care   Unsuccessful Outreach Note  08/14/2020 Name: Russell Collier MRN: [id number] DOB: [birth year]  Referred by: [name], MD Reason for referral : High Risk Managed Medicaid (Unsuccessful RNCM follow up outreach)   An unsuccessful telephone outreach was attempted today. The patient was referred to the case management team for assistance with care management and care coordination.   Follow Up Plan: The care management team will reach out to the patient again over the next 14 days.   [name] RN, BSN [place]  [employer] manager

## 2020-08-14 NOTE — Patient Instructions (Signed)
Visit Information  Mr. [name]  - as a part of your Medicaid benefit, you are eligible for care management and care coordination services at no cost or copay. I was unable to reach you by phone today but would be happy to help you with your health related needs. Please feel free to call me @ [phone] or to reschedule, call Anderson Malta @ [phone].   A member of the Managed Medicaid care management team will reach out to you again over the next 7-14 days.   [name] RN, BSN [place]  [employer] manager

## 2020-08-17 IMAGING — CT CT ANGIO CHEST
3 of 7 series · 19 of 36 positions shown · IV contrast (omnipaque)
Comparison: 07/26/2019 chest radiograph.  10/15/2018 CTA chest.

CLINICAL DATA: Chest pain.

EXAM:
CT ANGIOGRAPHY CHEST WITH CONTRAST
TECHNIQUE: Multidetector CT imaging of the chest was performed using the
standard protocol during bolus administration of intravenous
contrast. Multiplanar CT image reconstructions and MIPs were
obtained to evaluate the vascular anatomy.
CONTRAST:  60mL OMNIPAQUE IOHEXOL 350 MG/ML SOLN

[Series 7: pe thins · axial · 0.98mm/px · z∈[+205,+539]mm · 15 of 547 slices shown]
[im 35/547  lung]
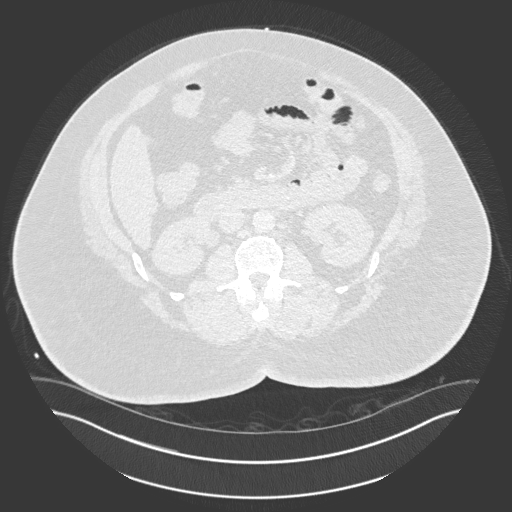
[im 69/547  mediastinal]
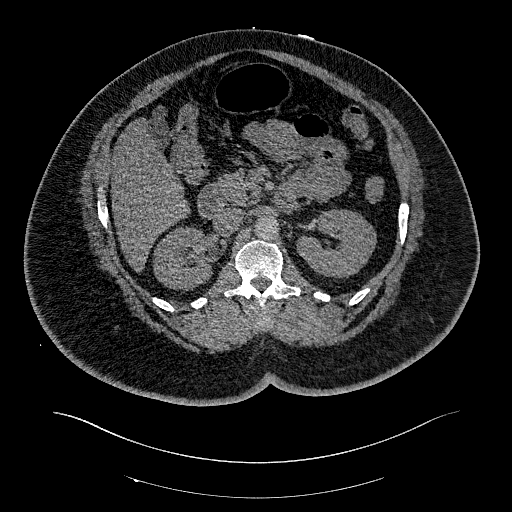
[im 103/547  lung]
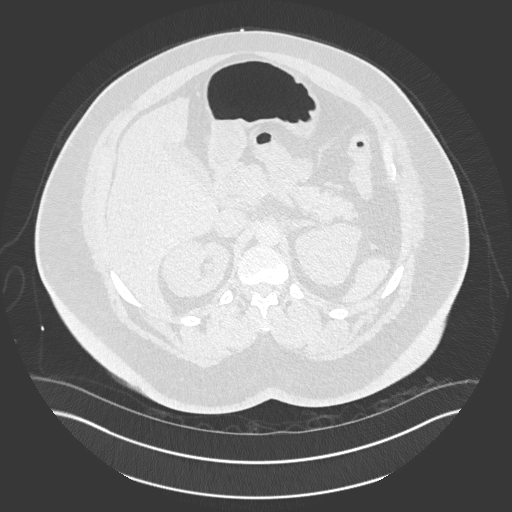
[im 137/547  mediastinal]
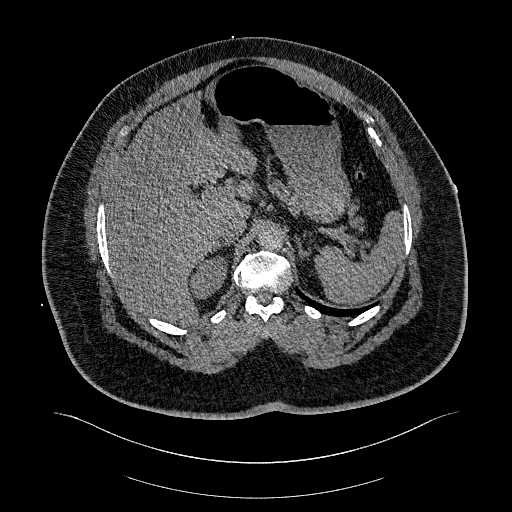
[im 171/547  lung]
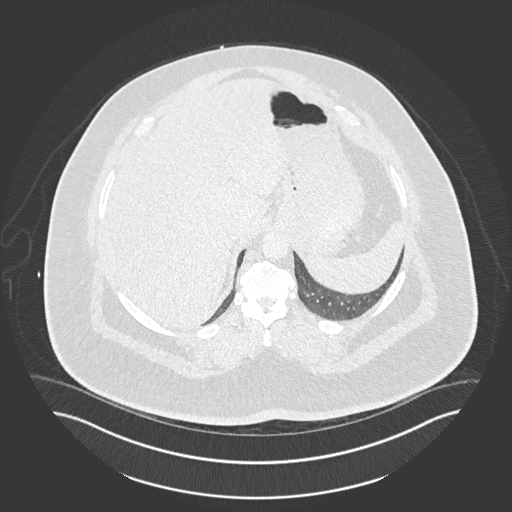
[im 205/547  mediastinal]
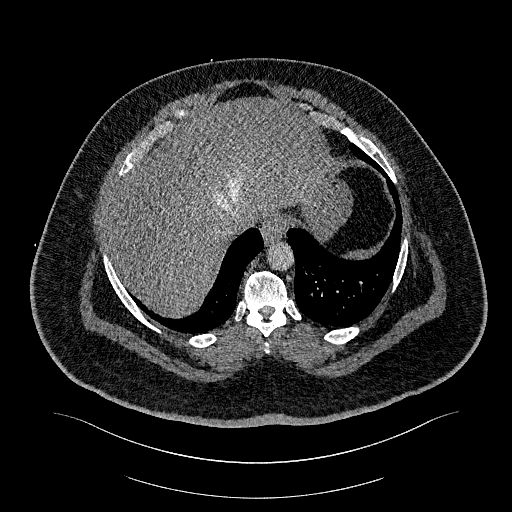
[im 239/547  lung]
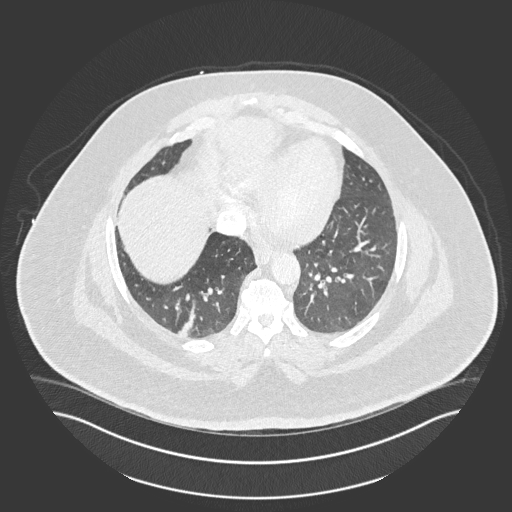
[im 274/547  mediastinal]
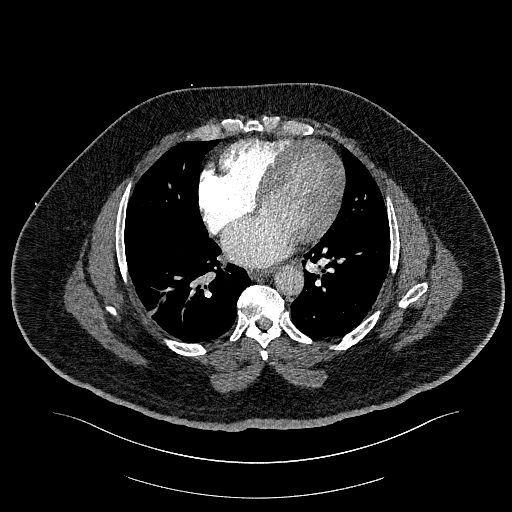
[im 308/547  lung]
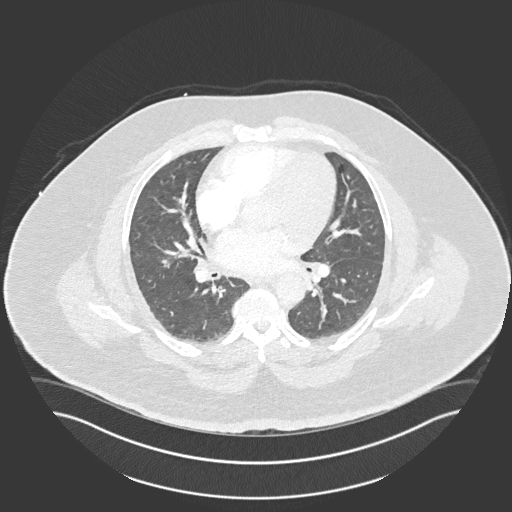
[im 342/547  mediastinal]
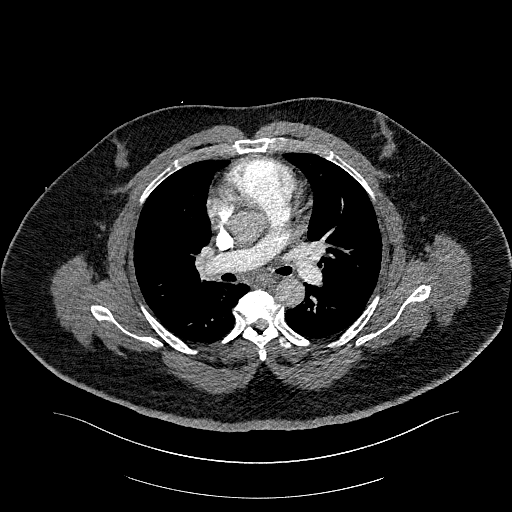
[im 376/547  lung]
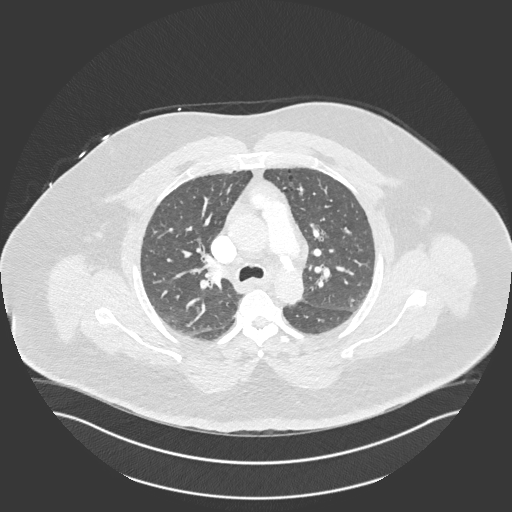
[im 410/547  mediastinal]
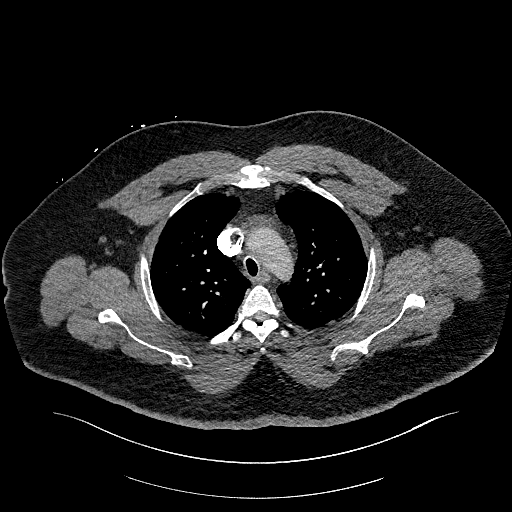
[im 444/547  lung]
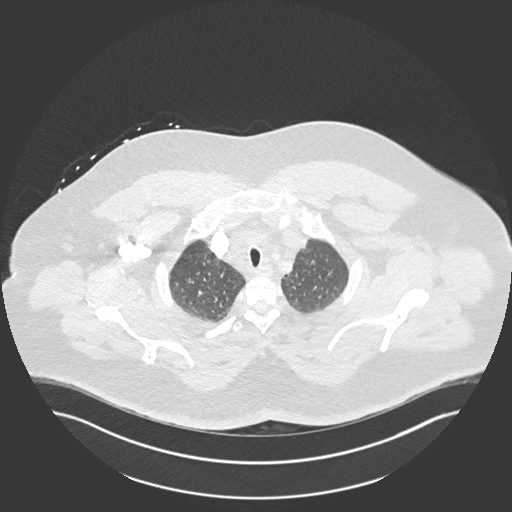
[im 478/547  mediastinal]
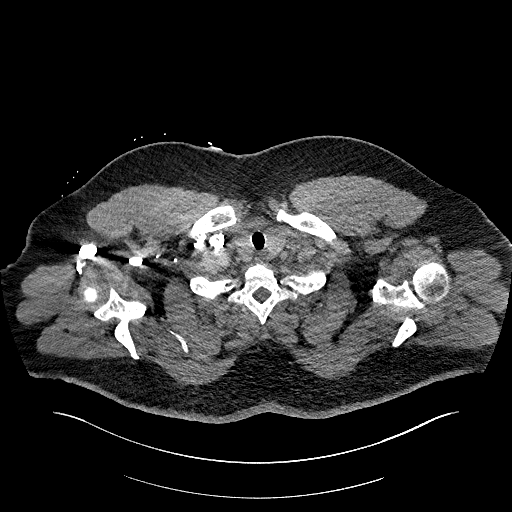
[im 512/547  lung]
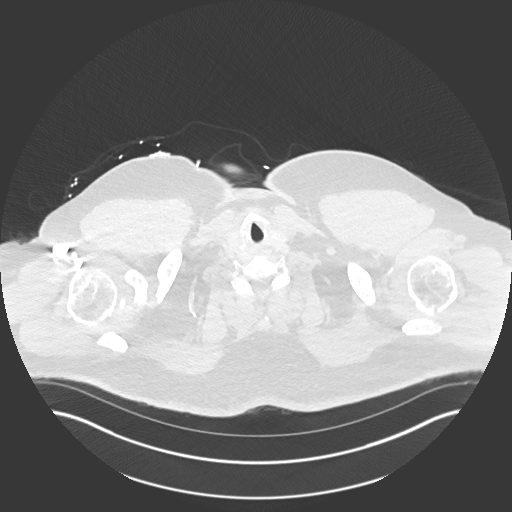

[Series 8: pe lung · axial · 0.71mm/px · z∈[+314,+460]mm · 3 of 147 slices shown]
[im 37/147  mediastinal]
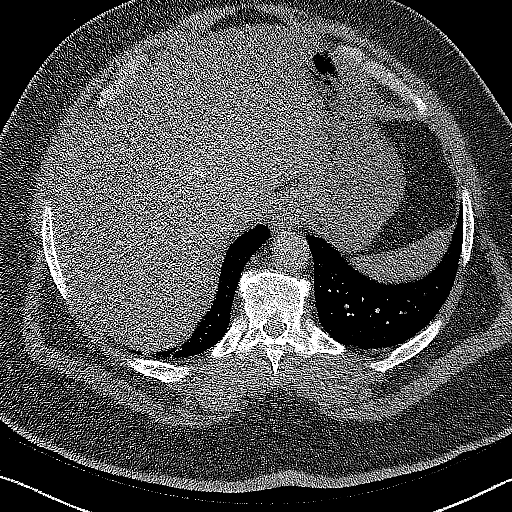
[im 74/147  mediastinal]
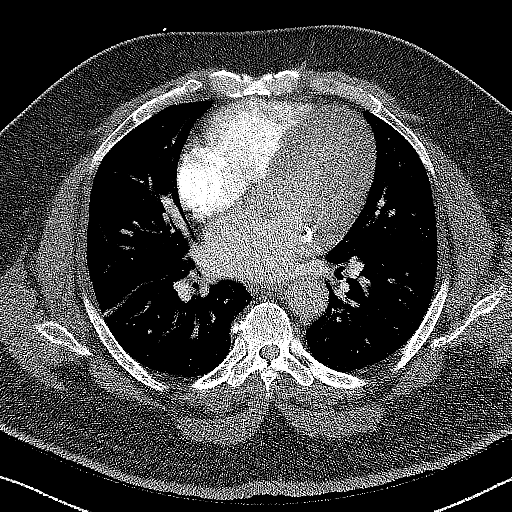
[im 110/147  mediastinal]
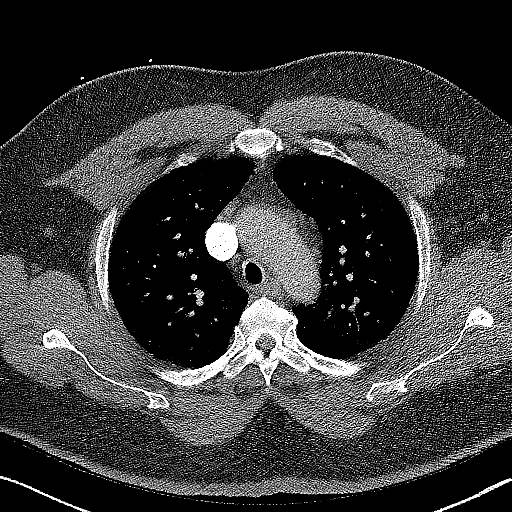

[Series 9: pe 2mm cor · coronal · 0.75mm/px · 1 of 203 slices shown]
[im 102/203  mediastinal]
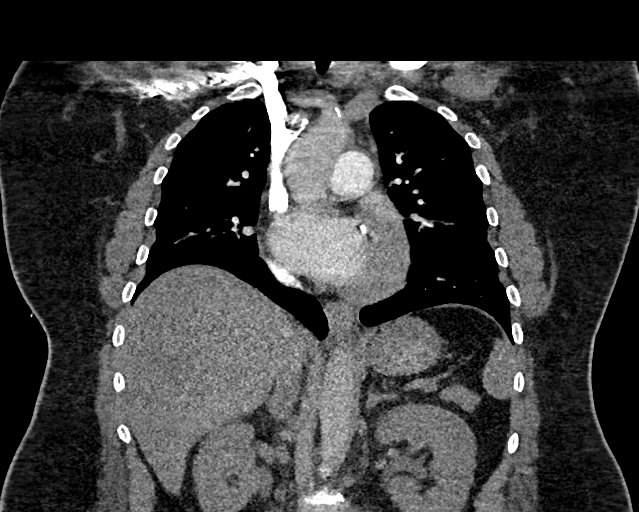

[19 of 36 positions shown; findings below may reference images not displayed]

FINDINGS: Cardiovascular: There are small filling defects involving left lower
lobe segmental branches ([DATE]). Normal heart size. No pericardial
effusion.

Mediastinum/Nodes: No adenopathy. Calcified AP window node. Normal
thyroid gland. The trachea and esophagus are unremarkable.

Lungs/Pleura: Bibasilar atelectasis and scattered small
pneumatoceles. No pleural effusion. No pneumothorax.

Upper Abdomen: Visualized upper abdomen is unremarkable.

Musculoskeletal: Bilateral gynecomastia. No acute or suspicious bone
lesion identified. Multilevel spondylosis.

Review of the MIP images confirms the above findings.
IMPRESSION: Left lower lobe segmental branch filling defects may reflect small
pulmonary emboli versus artifact.

No acute airspace disease. Additional chronic/incidental findings as
above.

## 2020-08-18 ENCOUNTER — Other Ambulatory Visit (HOSPITAL_COMMUNITY): Payer: Self-pay

## 2020-08-18 NOTE — Progress Notes (Signed)
Paramedicine Encounter    Patient ID: Russell Collier, male    DOB: 07-22-1960, 60 y.o.   MRN: [id number]  Met with [name] at home today where I completed a medication reconciliation and reviewed all medications filling two pill boxes for two weeks. [name] and I reviewed upcoming appointments and he understood same and agreed on home visit in two weeks.   Refills Potassium   -Also needs appointment for eye doctor.    ACTION: Home visit completed

## 2020-08-19 ENCOUNTER — Encounter: Payer: Self-pay | Admitting: Physical Therapy

## 2020-08-19 ENCOUNTER — Ambulatory Visit: Payer: Medicaid Other | Attending: Orthopaedic Surgery | Admitting: Physical Therapy

## 2020-08-19 ENCOUNTER — Other Ambulatory Visit: Payer: Self-pay

## 2020-08-19 DIAGNOSIS — G8929 Other chronic pain: Secondary | ICD-10-CM

## 2020-08-19 DIAGNOSIS — M6281 Muscle weakness (generalized): Secondary | ICD-10-CM | POA: Insufficient documentation

## 2020-08-19 DIAGNOSIS — M79661 Pain in right lower leg: Secondary | ICD-10-CM | POA: Insufficient documentation

## 2020-08-19 DIAGNOSIS — M545 Low back pain, unspecified: Secondary | ICD-10-CM | POA: Diagnosis present

## 2020-08-19 DIAGNOSIS — M25561 Pain in right knee: Secondary | ICD-10-CM | POA: Insufficient documentation

## 2020-08-19 DIAGNOSIS — R2689 Other abnormalities of gait and mobility: Secondary | ICD-10-CM | POA: Diagnosis present
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
## 2020-08-19 NOTE — Patient Instructions (Signed)
Access Code: [email or web address] URL: [email or web address] Date: 08/19/2020 Prepared by: Shearon Balo  Exercises Seated Calf Stretch with Strap - 1 x daily - 7 x weekly - 1 sets - 3 reps - 30 hold Ankle and Toe Plantarflexion with Resistance - 1 x daily - 7 x weekly - 3 sets - 10 reps - 5 hold

## 2020-08-26 ENCOUNTER — Other Ambulatory Visit: Payer: Self-pay | Admitting: *Deleted

## 2020-08-26 ENCOUNTER — Other Ambulatory Visit: Payer: Self-pay

## 2020-08-26 ENCOUNTER — Ambulatory Visit: Payer: Medicaid Other | Attending: Orthopaedic Surgery

## 2020-08-26 DIAGNOSIS — M25561 Pain in right knee: Secondary | ICD-10-CM | POA: Diagnosis present

## 2020-08-26 DIAGNOSIS — M79661 Pain in right lower leg: Secondary | ICD-10-CM | POA: Insufficient documentation

## 2020-08-26 DIAGNOSIS — G8929 Other chronic pain: Secondary | ICD-10-CM | POA: Insufficient documentation

## 2020-08-26 DIAGNOSIS — G629 Polyneuropathy, unspecified: Secondary | ICD-10-CM | POA: Diagnosis present

## 2020-08-26 DIAGNOSIS — R498 Other voice and resonance disorders: Secondary | ICD-10-CM | POA: Insufficient documentation

## 2020-08-26 DIAGNOSIS — R2689 Other abnormalities of gait and mobility: Secondary | ICD-10-CM | POA: Diagnosis present

## 2020-08-26 DIAGNOSIS — M545 Low back pain, unspecified: Secondary | ICD-10-CM | POA: Insufficient documentation

## 2020-08-26 DIAGNOSIS — M6281 Muscle weakness (generalized): Secondary | ICD-10-CM | POA: Insufficient documentation

## 2020-08-26 DIAGNOSIS — R258 Other abnormal involuntary movements: Secondary | ICD-10-CM | POA: Diagnosis present

## 2020-08-26 NOTE — Patient Instructions (Signed)
Visit Information  Mr. Savant was given information about Medicaid Managed Care team care coordination services as a part of their [place] Medicaid benefit. [name] verbally consented to engagement with the [facility] Managed Care team.   For questions related to your [employer], please call: [phone] or visit the homepage here: [email or web address]  If you would like to schedule transportation through your [facility], please call the following number at least 2 days in advance of your appointment: [phone].   Call the [place] at [phone], at any time, 24 hours a day, 7 days a week. If you are in danger or need immediate medical attention call 911.  If you would like help to quit smoking, call 1-800-QUIT-NOW 7430746070) OR Espaol: 1-855-Djelo-Ya (3-825-053-9767) o para ms informacin haga clic aqu or Text READY to 200-400 to register via text  [name] - following are the goals we discussed in your visit today:   Goals Addressed             This Visit's Progress    Protect My Health       Timeframe:  Long-Range Goal Priority:  Medium Start Date:    07/11/20                         Expected End Date:   10/10/20                  Follow Up Date 10/01/20   - Call ([phone]) or go online [email or web address] for [name] and [name] vouchers offered by [employer] - schedule recommended health tests (blood work, mammogram, colonoscopy, pap test) - schedule and keep appointment for annual check-up with PCP - work on maintaining a diabetic diet, compliant with gout restrictions - work on smoking cessation, call 1-800-QUIT-NOW or contact [place] [phone] for assistance    Why is this important?   Screening tests can find diseases early  when they are easier to treat.  Your doctor or nurse will talk with you about which tests are important for you.  Getting shots for common diseases like the flu and shingles will help prevent them.              Please see education materials related to joint pain and smoking cessation provided as print materials.   The patient verbalized understanding of instructions provided today and agreed to receive a mailed copy of patient instruction and/or educational materials.  Telephone follow up appointment with Managed Medicaid care management team member scheduled for:10/01/20 @ 10:30am  [name] RN, BSN [place] RN Care Coordinator   Following is a copy of your plan of care:  Patient Care Plan: Medication Management     Problem Identified: Health Promotion or Disease Self-Management (General Plan of Care)      Goal: Medication Management   Note:   Current Barriers:  Unable to independently monitor therapeutic efficacy Unable to self administer medications as prescribed   Pharmacist Clinical Goal(s):  Over the next 30 days, patient will achieve adherence to monitoring guidelines and medication adherence to achieve therapeutic efficacy through collaboration with PharmD and provider.    Interventions: Inter-disciplinary care team collaboration (see longitudinal plan of care) Comprehensive medication review performed; medication list updated in electronic medical record  [email or web address] @ Health Maintenance  Patient Goals/Self-Care Activities Over the next 30 days, patient will:  - take medications as  prescribed collaborate with provider on medication access solutions  Follow Up Plan: The care management team will reach out to the patient again over the next 30 days.       Patient Care Plan: General Plan of Care (Adult)     Problem Identified: Health Promotion or Disease Self-Management (General Plan of Care)      Long-Range  Goal: Self-Management Plan Developed   Start Date: 07/11/2020  Expected End Date: 10/10/2020  Recent Progress: On track  Priority: Medium  Note:   Current Barriers:  Ineffective Self Health Maintenance-Mr. Vanderpol is managing multiple health issues. He recently had back surgery and is having trouble sleeping due to needing a bed. He is managing right knee pain, in which he is waiting on the scheduling of an MRI. He would like to start PT and work on weight loss. He is managing diabetes with diet alone, recent A1C 6.0. He is wearing compression socks and having a hard time putting them on. Mr. Denardo is working on smoking cessation and has cut back to 1/2 pack a day.-Update-Mr. Pickrel has started PT for degenerative joint disease. He is waiting on [facility] agency to have adequate staffing for PCS. He is utilizing medical transportation provided by [facility]. Patient reports smoking a little more, now 1/2-1 pack a day. He would like to quit, but feels like he has made an improvement in his health by cutting out narcotics. Mr. Rachel receives Paramedicine assistance and is very thankful for this help. Lacks social connections Unable to perform ADLs independently Does not contact provider office for questions/concerns Currently UNABLE TO independently self manage needs related to chronic health conditions.  Knowledge Deficits related to short term plan for care coordination needs and long term plans for chronic disease management needs Nurse Case Manager Clinical Goal(s):  patient will work with care management team to address care coordination and chronic disease management needs related to knee pain and recent back surgery   Interventions:  Evaluation of current treatment plan related to knee pain and patient's adherence to plan as established by provider. Advised patient to follow up with [facility] agency for staffing update Provided education to patient re: joint pain Discussed plans with patient for ongoing care  management follow up and provided patient with direct contact information for care management team Encouraged patient to contact [facility] for [employer] and [employer] vouchers Advised patient to schedule follow up PCP appointment Discussed smoking cessation, information provided to patient Self Care Activities:  Patient will self administer medications as prescribed Patient will attend all scheduled provider appointments Patient will call pharmacy for medication refills Patient will call provider office for new concerns or questions Patient Goals: -- Call [phone]) or go online [email or web address] for [name] and [name] vouchers offered by [employer] - schedule recommended health tests (blood work, mammogram, colonoscopy, pap test) - schedule and keep appointment for annual check-up  - work on maintaining a diabetic diet, compliant with gout restrictions - work on smoking cessation, call 1-800-QUIT-NOW or contact [place] [phone] for assistance Follow Up Plan: Telephone follow up appointment with care management team member scheduled for:10/01/20 @ 10:30am

## 2020-08-26 NOTE — Patient Outreach (Signed)
Medicaid Managed Care   Nurse Care Manager Note  08/26/2020 Name:  Russell Collier MRN:  [id number] DOB:  09/18/1960  Russell Collier is an 60 y.o. year old male who is a primary patient of [name], MD.  The [facility] Managed Care Coordination team was consulted for assistance with:    pain  [name] was given information about Medicaid Managed Care Coordination team services today. Russell Collier agreed to services and verbal consent obtained.  Engaged with patient by telephone for follow up visit in response to provider referral for case management and/or care coordination services.   Assessments/Interventions:  Review of past medical history, allergies, medications, health status, including review of consultants reports, laboratory and other test data, was performed as part of comprehensive evaluation and provision of chronic care management services.  SDOH (Social Determinants of Health) assessments and interventions performed: SDOH Interventions    Flowsheet Row Most Recent Value  SDOH Interventions   Food Insecurity Interventions Intervention Not Indicated  Housing Interventions Intervention Not Indicated  Transportation Interventions Intervention Not Indicated       Care Plan  Allergies  Allergen Reactions   Varenicline Other (See Comments)    Patient reports laryngospasm stopped in the ER   Bupropion Other (See Comments)    Headache - moderate/severe - self discontinued agent     Medications Reviewed Today     Reviewed by Mathis Dad, PT (Physical Therapist) on 08/19/20 at 1054  Med List Status: <None>   Medication Order Taking? Sig Documenting Provider Last Dose Status Informant  Accu-Chek Softclix Lancets lancets [id number] No Use as instructed [name], MD Taking Active   albuterol (PROVENTIL) (2.5 MG/3ML) 0.083% nebulizer solution [id number] No Take 2.5 mg by nebulization every 6 (six) hours as needed for wheezing or shortness of breath.  [provider] Taking Active Multiple Informants  albuterol (VENTOLIN HFA) 108 (90 Base) MCG/ACT inhaler [id number] No Inhale 2 puffs into the lungs every 6 (six) hours as needed for wheezing or shortness of breath. [name], MD Taking Active   apixaban (ELIQUIS) 2.5 MG TABS tablet [id number] No Take 1 tablet (2.5 mg total) by mouth 2 (two) times daily. [name], MD Taking Active   atorvastatin (LIPITOR) 80 MG tablet [id number] No Take 1 tablet (80 mg total) by mouth daily. [name], MD Taking Active   baclofen (LIORESAL) 10 MG tablet [id number] No Take 0.5-1 tablets (5-10 mg total) by mouth 3 (three) times daily as needed. [name], MD Taking Active   Blood Glucose Monitoring Suppl (ACCU-CHEK GUIDE) w/Device KIT [id number] No 1 Units by Does not apply route daily. [name], MD Taking Active   dapagliflozin propanediol (FARXIGA) 10 MG TABS tablet [id number] No Take 1 tablet (10 mg total) by mouth daily before breakfast. [name], MD Taking Active   enalapril (VASOTEC) 20 MG tablet [id number] No Take 1 tablet (20 mg total) by mouth at bedtime. [name], MD Taking Active   fluticasone [facility]) 50 MCG/ACT nasal spray [id number] No Place 1 spray into both nostrils daily. [name], MD Taking Active   furosemide (LASIX) 40 MG tablet [id number] No Take 1 tablet (40 mg total) by mouth 2 (two) times daily. [name], MD Taking Active   glucose blood (ACCU-CHEK GUIDE) test strip [id number] No Use as instructed [name], MD Taking Active   isosorbide mononitrate (IMDUR) 30 MG 24 hr tablet [id number] No Take 1 tablet (30 mg total) by mouth daily. [name], MD Taking Active   Multiple  Vitamin (MULTIVITAMIN WITH MINERALS) TABS tablet [id number] No Take 1 tablet by mouth daily. [provider] Taking Active   pantoprazole (PROTONIX) 40 MG tablet [id number] No Take 1 tablet (40 mg total) by mouth daily. [name], MD  Taking Active   potassium chloride SA (KLOR-CON) 20 MEQ tablet [id number] No TAKE 1 TABLET(20 MEQ) BY MOUTH DAILY [name], MD Taking Active   pregabalin (LYRICA) 100 MG capsule [id number] No TAKE ONE CAPSULE BY MOUTH THREE TIMES DAILY McDiarmid, Blane Ohara, MD Taking Active   Tafamidis 61 MG CAPS [id number] No TAKE 1 CAPSULE BY MOUTH DAILY. [name], [name], MD Taking Active   umeclidinium-vilanterol [facility] ELLIPTA) 62.5-25 MCG/INH AEPB [id number] No Inhale 1 puff into the lungs daily as needed. [name], MD Taking Active   valACYclovir (VALTREX) 500 MG tablet [id number] No Take 1 tablet (500 mg total) by mouth daily. 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   COPD exacerbation ([place]) 03/20/2020   Type 2 diabetes mellitus with diabetic peripheral angiopathy without gangrene, without long-term current use of insulin ([place]) 03/20/2020   Coagulation defect ([place]) 12/11/2019   Fatigue 12/10/2019   Leg cramps 12/10/2019   Mild epistaxis 10/30/2019   Disc degeneration, 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   Ganglion of right knee 09/27/2019   Lumbar radiculopathy 09/27/2019   Lower extremity edema    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[place])    Acute respiratory failure ([place]) 07/27/2019   Primary osteoarthritis of right knee 07/24/2019   Breast tenderness in male 05/31/2019   Chronic pain 05/31/2019   AKI (acute kidney injury) ([place]) 05/31/2019   Pain due to onychomycosis of toenails of both feet 05/16/2019   Chronic knee pain 04/16/2019   Diabetes ([place]) 04/16/2019   Esophageal reflux 02/06/2019   Heartburn 02/06/2019   Chronic skin ulcer of lower leg ([place]) 12/21/2018   S/P drug eluting coronary stent placement    Coronary artery disease involving native coronary artery of native heart with unstable angina pectoris ([place]) 12/20/2018   Unstable angina (HCC)     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   Acute on chronic diastolic heart failure (HCC)    Shortness of breath    Precordial chest pain    Idiopathic peripheral neuropathy    Palpitations    Chronic diastolic heart failure (HCC)    Abnormal ankle brachial index (ABI)    Chest pain, rule out acute myocardial infarction 09/26/2018   History of cocaine abuse ([place]) 08/20/2018   Marijuana use 08/20/2018   Morbid obesity with BMI of 40.0-44.9, adult ([place]) 08/20/2018   Dyspepsia    Morbid obesity (HCC)    OSA on CPAP    Chest pain 05/30/2018   CAD (coronary artery disease) 03/30/2018   Elevated troponin 02/03/2018   Positive urine drug screen 02/03/2018   Tobacco abuse 02/03/2018   Hyperlipidemia 02/03/2018   Acute respiratory failure with hypoxia ([place]) 02/02/2018   Acute congestive heart failure (HCC)    Keratoconjunctivitis sicca of left eye not specified as Sjogren's 09/06/2017   Long term current use of oral hypoglycemic drug 09/06/2017   Mechanical ectropion of left lower eyelid 09/06/2017   Nuclear sclerotic cataract of both eyes 09/06/2017   Refractive amblyopia, left 09/06/2017   Type 2 diabetes mellitus without complication, without long-term current use of insulin ([place]) 09/06/2017   Hyperopia of both eyes  with astigmatism and presbyopia 09/06/2017   Atypical chest pain 12/27/2015   Essential hypertension    Neuropathy     Conditions to be addressed/monitored per PCP order:   pain  Care Plan : General Plan of Care (Adult)  Updates made by [name], RN since 08/26/2020 12:00 AM     Problem: Health Promotion or Disease Self-Management (General Plan of Care)      Long-Range Goal: Self-Management Plan Developed   Start Date: 07/11/2020  Expected End Date: 10/10/2020  Recent Progress: On track  Priority: Medium  Note:   Current Barriers:  Ineffective Self Health Maintenance-Mr. Parton is managing multiple health issues. He recently had back surgery and is having trouble  sleeping due to needing a bed. He is managing right knee pain, in which he is waiting on the scheduling of an MRI. He would like to start PT and work on weight loss. He is managing diabetes with diet alone, recent A1C 6.0. He is wearing compression socks and having a hard time putting them on. Mr. Remmert is working on smoking cessation and has cut back to 1/2 pack a day.-Update-Mr. Pine has started PT for degenerative joint disease. He is waiting on [facility] agency to have adequate staffing for PCS. He is utilizing medical transportation provided by [facility]. Patient reports smoking a little more, now 1/2-1 pack a day. He would like to quit, but feels like he has made an improvement in his health by cutting out narcotics. Mr. Spelman receives Paramedicine assistance and is very thankful for this help. Lacks social connections Unable to perform ADLs independently Does not contact provider office for questions/concerns Currently UNABLE TO independently self manage needs related to chronic health conditions.  Knowledge Deficits related to short term plan for care coordination needs and long term plans for chronic disease management needs Nurse Case Manager Clinical Goal(s):  patient will work with care management team to address care coordination and chronic disease management needs related to knee pain and recent back surgery   Interventions:  Evaluation of current treatment plan related to knee pain and patient's adherence to plan as established by provider. Advised patient to follow up with [facility] agency for staffing update Provided education to patient re: joint pain Discussed plans with patient for ongoing care management follow up and provided patient with direct contact information for care management team Encouraged patient to contact [facility] for Healthy Rew[employer] and [employer] vouchers Advised patient to schedule follow up PCP appointment Discussed smoking cessation, information provided to patient Self Care  Activities:  Patient will self administer medications as prescribed Patient will attend all scheduled provider appointments Patient will call pharmacy for medication refills Patient will call provider office for new concerns or questions Patient Goals: -- Call ([phone]) or go online [email or web address] for [name] and [name] vouchers offered by [employer] - schedule recommended health tests (blood work, mammogram, colonoscopy, pap test) - schedule and keep appointment for annual check-up  - work on maintaining a diabetic diet, compliant with gout restrictions - work on smoking cessation, call 1-800-QUIT-NOW or contact [place] [phone] for assistance Follow Up Plan: Telephone follow up appointment with care management team member scheduled for:10/01/20 @ 10:30am      Follow Up:  Patient agrees to Care Plan and Follow-up.  Plan: The Managed Medicaid care management team will reach out to the patient again over the next 30 days.  Date/time of next scheduled RN care management/care coordination outreach:  10/01/20 @ 10:30am  [name]  Dyke Maes, BSN [employer]
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
## 2020-08-26 NOTE — Patient Instructions (Signed)
   [id number]

## 2020-08-29 ENCOUNTER — Telehealth: Payer: Self-pay | Admitting: Pharmacist

## 2020-08-29 NOTE — Telephone Encounter (Signed)
Attempted contact RE tobacco use and potential cessation efforts.   No answer.

## 2020-08-29 NOTE — Telephone Encounter (Signed)
-----   Message from Leavy Cella, [place] sent at 07/25/2020 10:59 AM EDT ----- Regarding: Tobacco intake reduction - quit "cold-turkey"?

## 2020-09-01 ENCOUNTER — Other Ambulatory Visit: Payer: Self-pay

## 2020-09-01 ENCOUNTER — Ambulatory Visit: Payer: Medicaid Other

## 2020-09-01 ENCOUNTER — Other Ambulatory Visit (HOSPITAL_COMMUNITY): Payer: Self-pay

## 2020-09-01 DIAGNOSIS — M6281 Muscle weakness (generalized): Secondary | ICD-10-CM

## 2020-09-01 DIAGNOSIS — R498 Other voice and resonance disorders: Secondary | ICD-10-CM

## 2020-09-01 DIAGNOSIS — M79661 Pain in right lower leg: Secondary | ICD-10-CM

## 2020-09-01 DIAGNOSIS — R2689 Other abnormalities of gait and mobility: Secondary | ICD-10-CM

## 2020-09-01 DIAGNOSIS — R258 Other abnormal involuntary movements: Secondary | ICD-10-CM

## 2020-09-01 DIAGNOSIS — G629 Polyneuropathy, unspecified: Secondary | ICD-10-CM

## 2020-09-01 DIAGNOSIS — G8929 Other chronic pain: Secondary | ICD-10-CM

## 2020-09-01 DIAGNOSIS — M545 Low back pain, unspecified: Secondary | ICD-10-CM
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
## 2020-09-01 NOTE — Progress Notes (Signed)
Paramedicine Encounter    Patient ID: Shahab Keithly, male    DOB: 01/15/1961, 60 y.o.   MRN: [id number]   Patient Care Team: Cresenzo, Victor, MD as PCP - General (Family Medicine) Turner, Traci R, MD as PCP - Cardiology (Cardiology) Gross, Steven, MD as Consulting Physician (General Surgery) Schooler, Vincent, MD as Consulting Physician (Gastroenterology) Uris, Jenna H, LCSW as Social Worker (Licensed Clinical Social Worker) Patel, Donika K, DO as Consulting Physician (Neurology) Williams, Aaron, DC as Referring Physician (Chiropractic Medicine) Shields, Alexis J as Social Worker Kennedy, Nathan K, RPH as Pharmacist (Pharmacist) Robb, Melanie A, RN as Case Manager  Patient Active Problem List   Diagnosis Date Noted   Strain of right calf muscle 07/01/2020   Neuropathy, 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   COPD exacerbation (HCC) 03/20/2020   Type 2 diabetes mellitus with diabetic peripheral angiopathy without gangrene, without long-term current use of insulin (HCC) 03/20/2020   Coagulation defect (HCC) 12/11/2019   Fatigue 12/10/2019   Leg cramps 12/10/2019   Mild epistaxis 10/30/2019   Disc degeneration, 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   Ganglion of right knee 09/27/2019   Lumbar radiculopathy 09/27/2019   Lower extremity edema    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HCC)    Acute respiratory failure (HCC) 07/27/2019   Primary osteoarthritis of right knee 07/24/2019   Breast tenderness in male 05/31/2019   Chronic pain 05/31/2019   AKI (acute kidney injury) (HCC) 05/31/2019   Pain due to onychomycosis of toenails of both feet 05/16/2019   Chronic knee pain 04/16/2019   Diabetes (HCC) 04/16/2019   Esophageal reflux 02/06/2019   Heartburn 02/06/2019   Chronic skin ulcer of lower leg (HCC) 12/21/2018   S/P drug eluting coronary stent placement    Coronary artery disease involving native coronary artery of native heart with unstable angina pectoris (HCC) 12/20/2018   Unstable angina (HCC)    Laryngeal  spasm 10/17/2018   Acute on chronic diastolic heart failure (HCC)    Shortness of breath    Precordial chest pain    Idiopathic peripheral neuropathy    Palpitations    Chronic diastolic heart failure (HCC)    Abnormal ankle brachial index (ABI)    Chest pain, rule out acute myocardial infarction 09/26/2018   History of cocaine abuse (HCC) 08/20/2018   Marijuana use 08/20/2018   Morbid obesity with BMI of 40.0-44.9, adult (HCC) 08/20/2018   Dyspepsia    Morbid obesity (HCC)    OSA on CPAP    Chest pain 05/30/2018   CAD (coronary artery disease) 03/30/2018   Elevated troponin 02/03/2018   Positive urine drug screen 02/03/2018   Tobacco abuse 02/03/2018   Hyperlipidemia 02/03/2018   Acute respiratory failure with hypoxia (HCC) 02/02/2018   Acute congestive heart failure (HCC)    Keratoconjunctivitis sicca of left eye not specified as Sjogren's 09/06/2017   Long term current use of oral hypoglycemic drug 09/06/2017   Mechanical ectropion of left lower eyelid 09/06/2017   Nuclear sclerotic cataract of both eyes 09/06/2017   Refractive amblyopia, left 09/06/2017   Type 2 diabetes mellitus without complication, without long-term current use of insulin (HCC) 09/06/2017   Hyperopia of both eyes with astigmatism and presbyopia 09/06/2017   Atypical chest pain 12/27/2015   Essential hypertension    Neuropathy     Current Outpatient Medications:    Accu-Chek Softclix Lancets lancets, Use as instructed, Disp: 100 each, Rfl: 12   albuterol (PROVENTIL) (2.5 MG/3ML) 0.083% nebulizer solution, Take 2.5 mg by nebulization every 6 (six) hours as needed   for wheezing or shortness of breath., Disp: , Rfl:    albuterol (VENTOLIN HFA) 108 (90 Base) MCG/ACT inhaler, Inhale 2 puffs into the lungs every 6 (six) hours as needed for wheezing or shortness of breath., Disp: 18 g, Rfl: 3   apixaban (ELIQUIS) 2.5 MG TABS tablet, Take 1 tablet (2.5 mg total) by mouth 2 (two) times daily., Disp: 60 tablet, Rfl:  11   atorvastatin (LIPITOR) 80 MG tablet, Take 1 tablet (80 mg total) by mouth daily., Disp: 90 tablet, Rfl: 3   baclofen (LIORESAL) 10 MG tablet, Take 0.5-1 tablets (5-10 mg total) by mouth 3 (three) times daily as needed., Disp: 30 each, Rfl: 0   Blood Glucose Monitoring Suppl (ACCU-CHEK GUIDE) w/Device KIT, 1 Units by Does not apply route daily., Disp: 1 kit, Rfl: 0   dapagliflozin propanediol (FARXIGA) 10 MG TABS tablet, Take 1 tablet (10 mg total) by mouth daily before breakfast., Disp: 30 tablet, Rfl: 6   enalapril (VASOTEC) 20 MG tablet, Take 1 tablet (20 mg total) by mouth at bedtime., Disp: 90 tablet, Rfl: 3   fluticasone (FLONASE) 50 MCG/ACT nasal spray, Place 1 spray into both nostrils daily., Disp: 16 g, Rfl: 11   furosemide (LASIX) 40 MG tablet, Take 1 tablet (40 mg total) by mouth 2 (two) times daily., Disp: 180 tablet, Rfl: 3   glucose blood (ACCU-CHEK GUIDE) test strip, Use as instructed, Disp: 100 each, Rfl: 12   isosorbide mononitrate (IMDUR) 30 MG 24 hr tablet, Take 1 tablet (30 mg total) by mouth daily., Disp: 90 tablet, Rfl: 3   Multiple Vitamin (MULTIVITAMIN WITH MINERALS) TABS tablet, Take 1 tablet by mouth daily., Disp: , Rfl:    pantoprazole (PROTONIX) 40 MG tablet, Take 1 tablet (40 mg total) by mouth daily., Disp: 90 tablet, Rfl: 0   potassium chloride SA (KLOR-CON) 20 MEQ tablet, TAKE 1 TABLET(20 MEQ) BY MOUTH DAILY, Disp: 90 tablet, Rfl: 3   pregabalin (LYRICA) 100 MG capsule, TAKE ONE CAPSULE BY MOUTH THREE TIMES DAILY, Disp: 270 capsule, Rfl: 2   Tafamidis 61 MG CAPS, TAKE 1 CAPSULE BY MOUTH DAILY., Disp: 30 capsule, Rfl: 11   umeclidinium-vilanterol (ANORO ELLIPTA) 62.5-25 MCG/INH AEPB, Inhale 1 puff into the lungs daily as needed., Disp: 60 each, Rfl: 3   valACYclovir (VALTREX) 500 MG tablet, Take 1 tablet (500 mg total) by mouth daily., Disp: 90 tablet, Rfl: 3 Allergies  Allergen Reactions   Varenicline Other (See Comments)    Patient reports laryngospasm stopped  in the ER   Bupropion Other (See Comments)    Headache - moderate/severe - self discontinued agent      Social History   Socioeconomic History   Marital status: Divorced    Spouse name: Not on file   Number of children: 2   Years of education: 10   Highest education level: Not on file  Occupational History   Occupation: not employed  Tobacco Use   Smoking status: Every Day    Packs/day: 0.50    Years: 54.00    Pack years: 27.00    Types: Cigarettes, Cigars   Smokeless tobacco: Never   Tobacco comments:    1/2 to 1 pack daily  Vaping Use   Vaping Use: Never used  Substance and Sexual Activity   Alcohol use: Yes    Alcohol/week: 4.0 standard drinks    Types: 2 Cans of beer, 2 Shots of liquor per week   Drug use: Not Currently    Comment: last use  may last year   Sexual activity: Yes  Other Topics Concern   Not on file  Social History Narrative   Right handed   Two story home   No caffeine   Social Determinants of Health   Financial Resource Strain: Not on file  Food Insecurity: No Food Insecurity   Worried About Running Out of Food in the Last Year: Never true   Ran Out of Food in the Last Year: Never true  Transportation Needs: No Transportation Needs   Lack of Transportation (Medical): No   Lack of Transportation (Non-Medical): No  Physical Activity: Not on file  Stress: Not on file  Social Connections: Not on file  Intimate Partner Violence: Not on file    Physical Exam Vitals reviewed.  Constitutional:      Appearance: Normal appearance. He is normal weight.  HENT:     Head: Normocephalic.     Nose: Nose normal.     Mouth/Throat:     Mouth: Mucous membranes are moist.     Pharynx: Oropharynx is clear.  Eyes:     Pupils: Pupils are equal, round, and reactive to light.  Cardiovascular:     Rate and Rhythm: Normal rate and regular rhythm.     Pulses: Normal pulses.     Heart sounds: Normal heart sounds.  Pulmonary:     Effort: Pulmonary effort  is normal.     Breath sounds: Normal breath sounds.  Abdominal:     General: Abdomen is flat.     Palpations: Abdomen is soft.  Musculoskeletal:        General: Normal range of motion.     Cervical back: Normal range of motion.  Skin:    General: Skin is warm and dry.     Capillary Refill: Capillary refill takes less than 2 seconds.  Neurological:     General: No focal deficit present.     Mental Status: He is alert. Mental status is at baseline.  Psychiatric:        Mood and Affect: Mood normal.    Arrived for home visit for Monte who was alert and oriented reporting to be feeling good. He states he just completed exercising by walking in his neighborhood from his home to the end of two blocks and back without needing to stop for breaks. [name] and I reviewed upcoming appointments. He is requesting a follow up with his PCP and Cardiology. I will assist in scheduling same. I reviewed and obtained vitals. We reviewed medications filling two weeks worth of pill boxes. Dekendrick denied any chest pain, shortness of breath, dizziness or trouble sleeping or completing daily activities. I will reach out to [place] Constable with [facility] to check on status of pill packs so we can prepare to discharge [name] from paramedicine. Home visit complete. I will see him in two weeks.   Refills: Valtrex Pantoprazole Farxiga   CBG- 110  Future Appointments  Date Time Provider [place]  09/01/2020  2:00 PM Cherie Ouch, PT [facility]  09/03/2020  2:45 PM Cherie Ouch, PT [facility]  09/08/2020  3:30 PM Cherie Ouch, PT [facility]  09/10/2020  2:45 PM Cherie Ouch, PT [facility]  09/15/2020  3:30 PM Cherie Ouch, PT [facility]  09/17/2020  2:45 PM Cherie Ouch, PT [facility]  09/19/2020  2:15 PM [name], DPM [facility]  10/01/2020 10:30 AM [facility] CARE MANAGER [facility] None  ACTION: Home visit completed

## 2020-09-01 NOTE — Patient Instructions (Signed)
   [id number]

## 2020-09-02 ENCOUNTER — Telehealth (HOSPITAL_COMMUNITY): Payer: Self-pay

## 2020-09-02 ENCOUNTER — Other Ambulatory Visit: Payer: Medicaid Other

## 2020-09-02 NOTE — Telephone Encounter (Signed)
[place] in regards to pill packs for [name]. They advised they have him set up for delivery for pill packs on August 11th so all medications will be synced for all refills. I will make [name] aware that we will continue bi-weekly visits until pill packs are delivered and confirmed.   Refills called in today: Valtrex Pantoprazole Farxiga    Confirmed by Radonna Ricker at [facility] at [place].

## 2020-09-02 NOTE — Patient Outreach (Signed)
Heather, paramedic with patient, [phone], called and wondered if we could push up the date he's in packs because she needs to make room for other patients on her service. I gave her Ali's number at [facility] and gave Deatra Canter her number since Deatra Canter is the one who creates refill calendar

## 2020-09-03 ENCOUNTER — Other Ambulatory Visit: Payer: Self-pay

## 2020-09-03 ENCOUNTER — Other Ambulatory Visit (HOSPITAL_COMMUNITY): Payer: Self-pay

## 2020-09-03 ENCOUNTER — Telehealth (HOSPITAL_COMMUNITY): Payer: Self-pay | Admitting: Pharmacy Technician

## 2020-09-03 ENCOUNTER — Ambulatory Visit: Payer: Medicaid Other

## 2020-09-03 DIAGNOSIS — M6281 Muscle weakness (generalized): Secondary | ICD-10-CM

## 2020-09-03 DIAGNOSIS — M79661 Pain in right lower leg: Secondary | ICD-10-CM

## 2020-09-03 DIAGNOSIS — M545 Low back pain, unspecified: Secondary | ICD-10-CM

## 2020-09-03 DIAGNOSIS — R2689 Other abnormalities of gait and mobility: Secondary | ICD-10-CM

## 2020-09-03 DIAGNOSIS — G629 Polyneuropathy, unspecified: Secondary | ICD-10-CM

## 2020-09-03 DIAGNOSIS — G8929 Other chronic pain: Secondary | ICD-10-CM

## 2020-09-03 DIAGNOSIS — R498 Other voice and resonance disorders: Secondary | ICD-10-CM

## 2020-09-03 DIAGNOSIS — R258 Other abnormal involuntary movements: Secondary | ICD-10-CM

## 2020-09-03 MED FILL — Tafamidis Cap 61 MG: ORAL | 30 days supply | Qty: 30 | Fill #3 | Status: AC

## 2020-09-03 NOTE — Patient Instructions (Signed)
  [id number]

## 2020-09-03 NOTE — Telephone Encounter (Addendum)
Patient Advocate Encounter   Received notification from [facility] Medicaid that prior authorization for Vyndamax is required.   PA submitted on CoverMyMeds Key  [id number] Status is pending   Will continue to follow.
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
## 2020-09-04 IMAGING — DX DG CHEST 2V
2 series · 2 of 2 positions shown · non-contrast
Comparison: Radiograph 08/06/2019, CT 07/27/2019

CLINICAL DATA: Chest pain

EXAM:
CHEST - 2 VIEW

[chest pa]
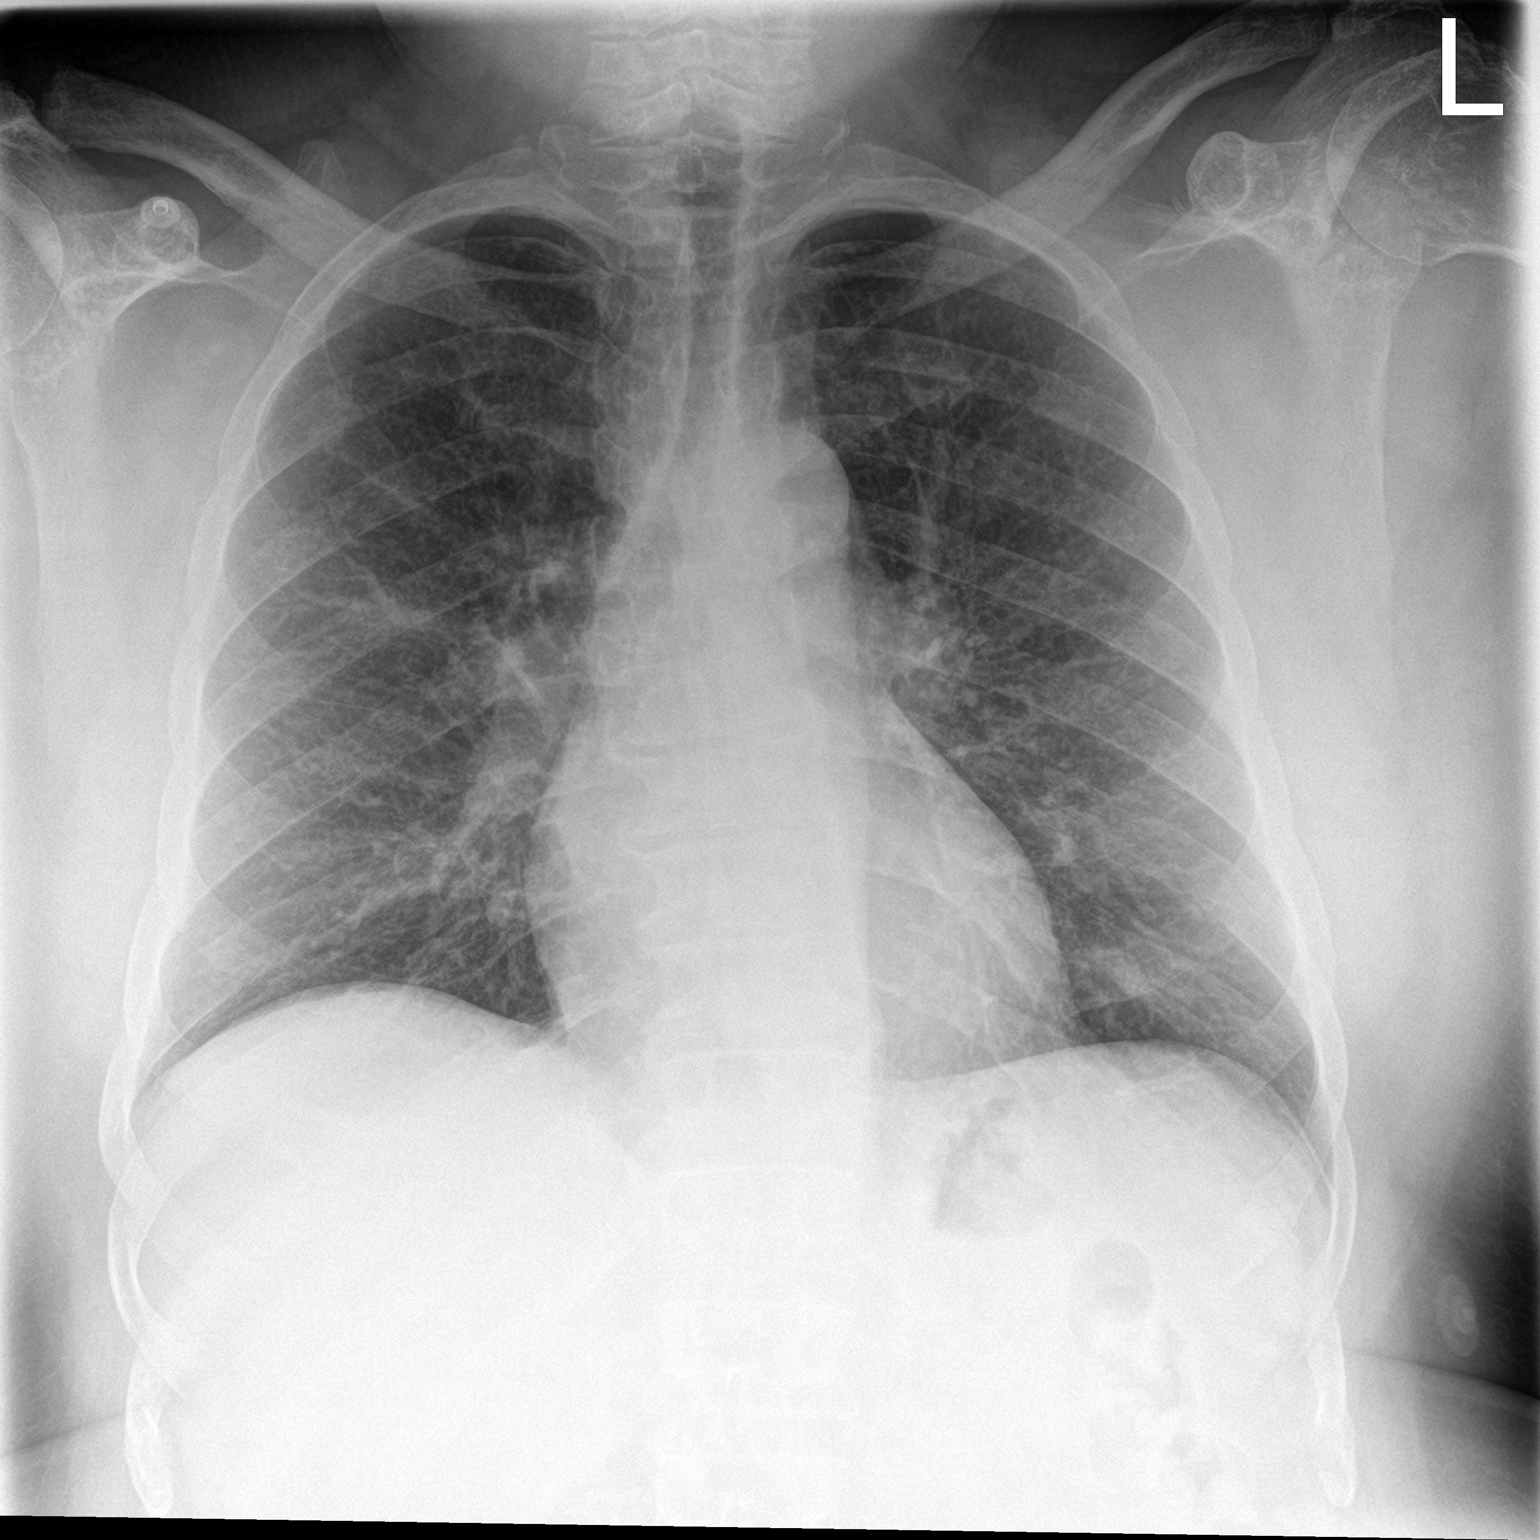

[chest lat]
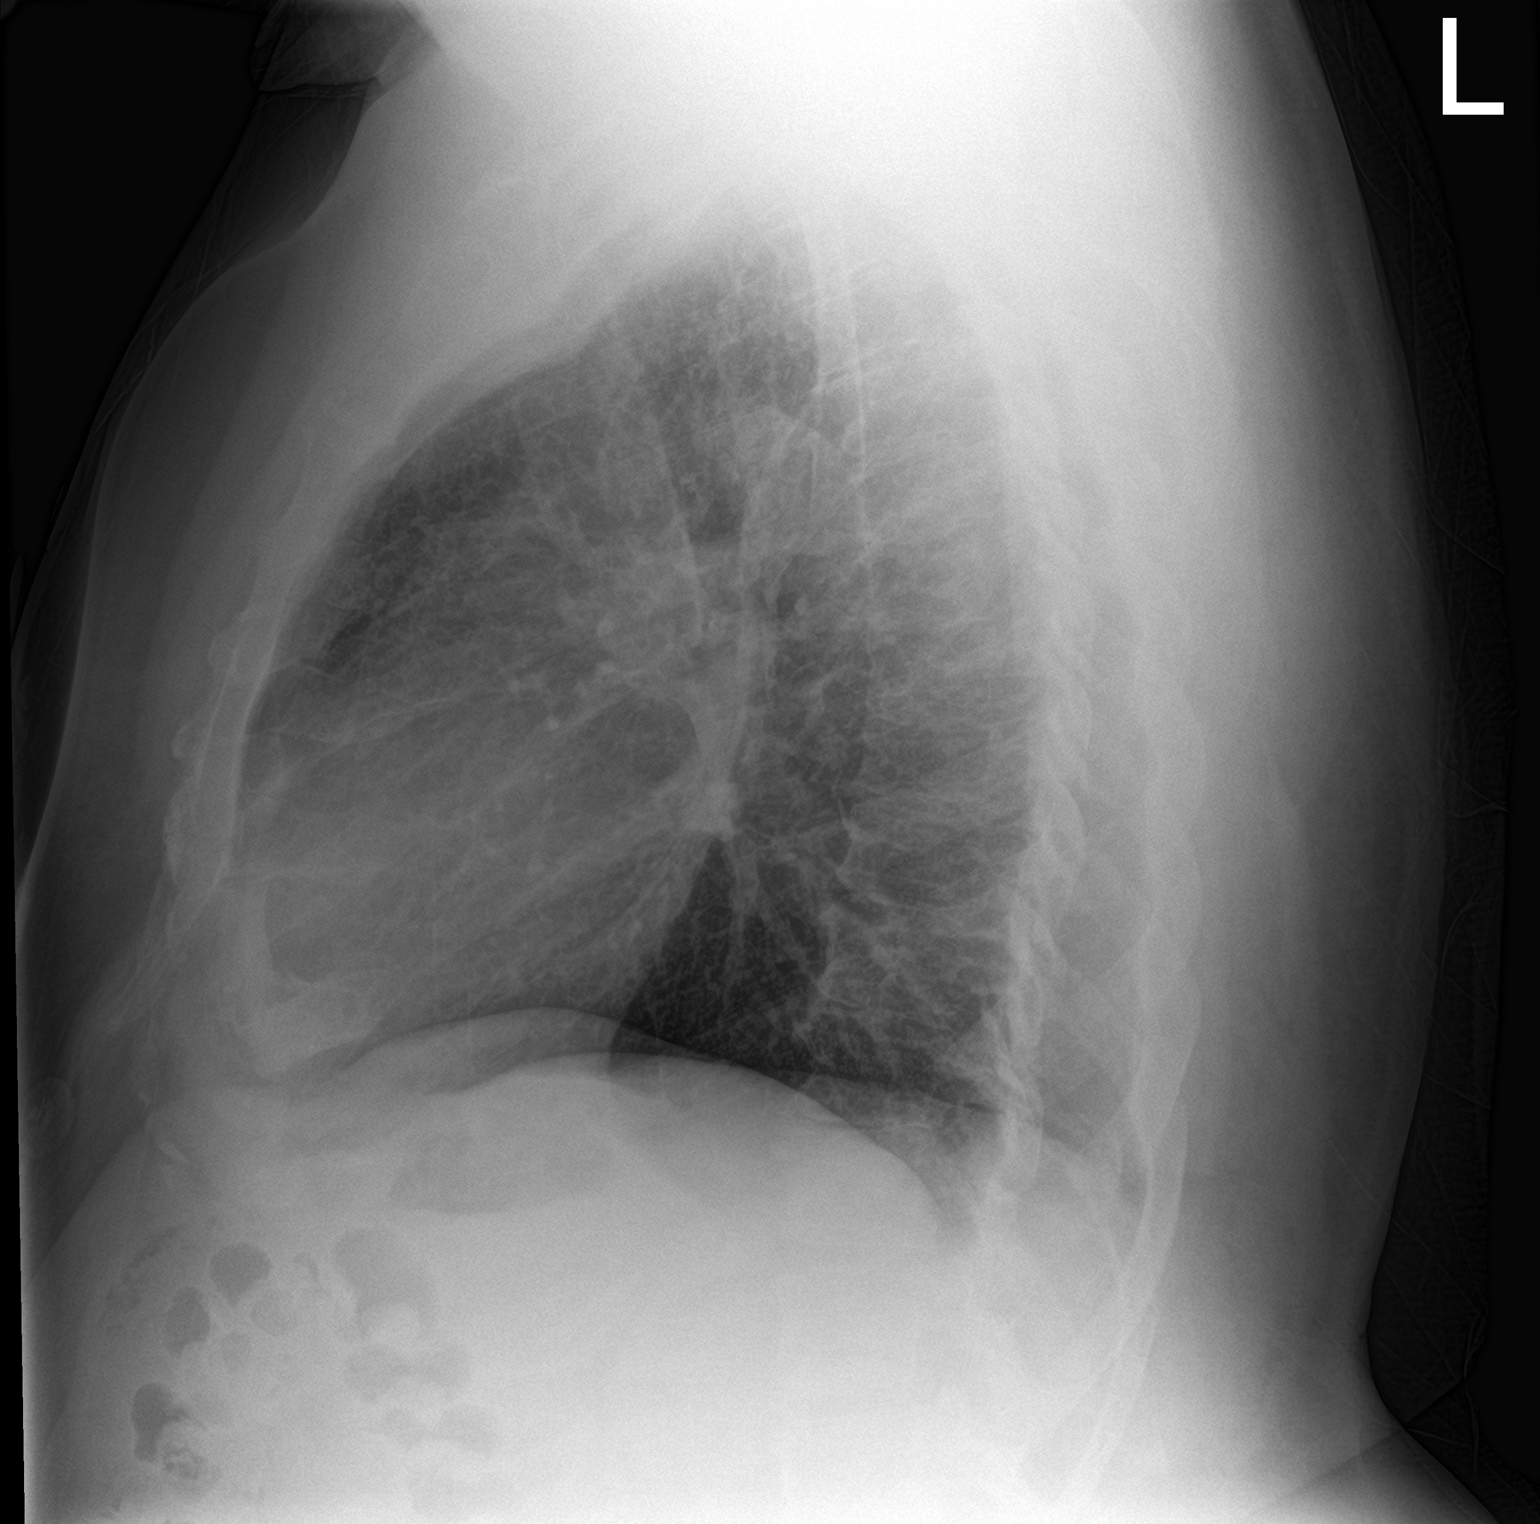

[2 of 2 positions shown; findings below may reference images not displayed]

FINDINGS: There is some chronically coarsened interstitial opacities and some
mild central vascular congestion but without features of frank edema
at this time. No focal consolidation, pneumothorax or effusion.
Cardiomediastinal contours are unremarkable and similar to prior. No
acute osseous or soft tissue abnormality. Degenerative changes are
present in the imaged spine and shoulders.
IMPRESSION: 1. Mild central vascular congestion without features of frank edema
at this time.
2. Chronically coarsened interstitial opacities.

## 2020-09-08 ENCOUNTER — Ambulatory Visit: Payer: Medicaid Other

## 2020-09-08 ENCOUNTER — Other Ambulatory Visit: Payer: Self-pay

## 2020-09-08 DIAGNOSIS — M79661 Pain in right lower leg: Secondary | ICD-10-CM

## 2020-09-08 DIAGNOSIS — G8929 Other chronic pain: Secondary | ICD-10-CM

## 2020-09-08 DIAGNOSIS — G629 Polyneuropathy, unspecified: Secondary | ICD-10-CM

## 2020-09-08 DIAGNOSIS — M545 Low back pain, unspecified: Secondary | ICD-10-CM

## 2020-09-08 DIAGNOSIS — R2689 Other abnormalities of gait and mobility: Secondary | ICD-10-CM

## 2020-09-08 DIAGNOSIS — M6281 Muscle weakness (generalized): Secondary | ICD-10-CM

## 2020-09-08 DIAGNOSIS — M25561 Pain in right knee: Secondary | ICD-10-CM

## 2020-09-08 NOTE — Patient Instructions (Addendum)
Pt instructed to forego at-home quadriceps stretching until next visit due to pain following stretching in clinic last Friday. Reviewed HEP with pt, stressing proper form with exercises.
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
## 2020-09-10 ENCOUNTER — Other Ambulatory Visit (HOSPITAL_COMMUNITY): Payer: Self-pay

## 2020-09-10 ENCOUNTER — Other Ambulatory Visit: Payer: Self-pay

## 2020-09-10 ENCOUNTER — Ambulatory Visit: Payer: Medicaid Other

## 2020-09-10 DIAGNOSIS — M79661 Pain in right lower leg: Secondary | ICD-10-CM

## 2020-09-10 DIAGNOSIS — G629 Polyneuropathy, unspecified: Secondary | ICD-10-CM

## 2020-09-10 DIAGNOSIS — M6281 Muscle weakness (generalized): Secondary | ICD-10-CM

## 2020-09-10 DIAGNOSIS — M545 Low back pain, unspecified: Secondary | ICD-10-CM

## 2020-09-10 DIAGNOSIS — R2689 Other abnormalities of gait and mobility: Secondary | ICD-10-CM

## 2020-09-10 DIAGNOSIS — G8929 Other chronic pain: Secondary | ICD-10-CM

## 2020-09-10 NOTE — Patient Instructions (Signed)
  [id number]
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
## 2020-09-15 ENCOUNTER — Telehealth (HOSPITAL_COMMUNITY): Payer: Self-pay

## 2020-09-15 ENCOUNTER — Ambulatory Visit: Payer: Medicaid Other

## 2020-09-15 ENCOUNTER — Other Ambulatory Visit: Payer: Self-pay

## 2020-09-15 DIAGNOSIS — G8929 Other chronic pain: Secondary | ICD-10-CM

## 2020-09-15 DIAGNOSIS — M79661 Pain in right lower leg: Secondary | ICD-10-CM

## 2020-09-15 DIAGNOSIS — R2689 Other abnormalities of gait and mobility: Secondary | ICD-10-CM

## 2020-09-15 DIAGNOSIS — M6281 Muscle weakness (generalized): Secondary | ICD-10-CM

## 2020-09-15 DIAGNOSIS — M545 Low back pain, unspecified: Secondary | ICD-10-CM

## 2020-09-15 DIAGNOSIS — G629 Polyneuropathy, unspecified: Secondary | ICD-10-CM

## 2020-09-15 NOTE — Patient Instructions (Signed)
  [id number]

## 2020-09-15 NOTE — Telephone Encounter (Signed)
I called [name] to let him know [name] would be unable to keep her appointment with him this week since she is out of the office. He stated he was looking forward to seeing her so she could fill his pillbox. I advised I would be happy to help him with this over the phone but he was adamant that he would not be able to fill his pillbox due to neuropathy in his fingers. I asked if he minded going through his medications over the phone and he agreed. We went through his medications one by one and he was able to explain to me how he takes each one. He stated he felt like taking them from the bottles would be best until [name] returns to help him fill his pillbox. The only request he had today was that a refill of Flonase be called into the pharmacy and have it delivered. I contacted the pharmacy and they advised they would process the request and contact him if there are any issues. Otherwise it would be delivered tomorrow. Nothing further was necessary at this time.  [name] will follow up next week.   Jacquiline Doe, EMT 09/15/20
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
## 2020-09-16 ENCOUNTER — Encounter: Payer: Self-pay | Admitting: Family Medicine

## 2020-09-16 ENCOUNTER — Ambulatory Visit (INDEPENDENT_AMBULATORY_CARE_PROVIDER_SITE_OTHER): Payer: Medicaid Other | Admitting: Family Medicine

## 2020-09-16 VITALS — BP 112/72 | HR 82 | Ht 71.0 in | Wt 297.4 lb

## 2020-09-16 DIAGNOSIS — I10 Essential (primary) hypertension: Secondary | ICD-10-CM | POA: Diagnosis not present

## 2020-09-16 DIAGNOSIS — J441 Chronic obstructive pulmonary disease with (acute) exacerbation: Secondary | ICD-10-CM

## 2020-09-16 DIAGNOSIS — Z23 Encounter for immunization: Secondary | ICD-10-CM | POA: Diagnosis not present

## 2020-09-16 DIAGNOSIS — E1151 Type 2 diabetes mellitus with diabetic peripheral angiopathy without gangrene: Secondary | ICD-10-CM

## 2020-09-16 LAB — POCT GLYCOSYLATED HEMOGLOBIN (HGB A1C): HbA1c, POC (controlled diabetic range): 6.2 % (ref 0.0–7.0)

## 2020-09-16 NOTE — Assessment & Plan Note (Addendum)
HbA1C is 6.2 today Diabetic foot exam revealed slight decreased sensation in right heel, continue checking feet regularly Schedule appointment with opthalmology for eye exam Continue current medication regiment and dietary/exercise changes

## 2020-09-16 NOTE — Progress Notes (Addendum)
    SUBJECTIVE:   CHIEF COMPLAINT / HPI: Annual follow up visit, no acute complaints  [name] is doing well today and is in good spirits.  Back pain: He is recovering slowly from his back surgery a few months ago and is still seeing PT. He left the nursing home 3 months ago and has a nurse that comes by weekly to check on him.  Diabetes: [name] checks his blood sugars weekly. The lowest it has been was 87 and the highest was 137. He is able to take his medications daily and has no concerns. He doesn't check his feet regularly but is seeing a doctor for gout tomorrow  Health Maintenance: [name] wants Pneumovax and Shingrix today. He received his 2nd COVID booster. [name] started smoking again to distract him from the pain in lieu of percoset. He is aware it is not great but chose between the two. We discussed that it is best for his health to not use either to help the pain.   PERTINENT  PMH / PSH:  Chronic Back pain Diabetes COPD  OBJECTIVE:   BP 112/72   Pulse 82   Ht '5\' 11"'$  (1.803 m)   Wt 297 lb 6.4 oz (134.9 kg)   SpO2 96%   BMI 41.48 kg/m   GEN: Well appearing, in no acute distress HEENT: normocephalic, atraumatic. Sclera white, pupils equal and reactive to light. CARD: Regular rate and rhythm. Bilateral pulses throughout. PULM: Clear to auscultation, normal work of breathing ABD: Soft, nontender to palpation EXTREMITIES: Well perfused. Slight decreased sensation on bottom right foot on diabetic foot exam MSK: Limited mobility due to recent back surgery. Reflexes intact. SKIN: Warm, dry  Foot/Ankle Musculoskeletal Exam  Inspection   Right     Right foot/ankle inspection is normal.    Left     Left foot/ankle inspection is normal.    Palpation   Right     Right foot/ankle palpation is unremarkable.     Left      Left foot/ankle palpation is unremarkable.    General     Constitutional: appears stated age    ASSESSMENT/PLAN:   Type 2 diabetes  mellitus with diabetic peripheral angiopathy without gangrene, without long-term current use of insulin (HCC) HbA1C is 6.2 today Diabetic foot exam revealed slight decreased sensation in right heel, continue checking feet regularly Schedule appointment with opthalmologic for eye exam Continue current medication regiment and dietary/exercise changes  Essential hypertension Well controlled on current medication regimen  COPD exacerbation ([place]) Well controlled on current medication regimen   Health Maintenance Administered Pneumovax and provided guidance for obtaining Shingrix Obtained BMP to follow up up on slightly elevated Cr from 03/25/20 and routine annual LDL labs    [place]    Resident Attestation  I saw and evaluated the patient, performing the key elements of the service.I  personally performed or re-performed the history, physical exam, and medical decision making activities of this service and have verified that the service and findings are accurately documented in the student's note. I developed the management plan that is described in the medical student's note, and I agree with the content, with my edits above.    [name], PGY3

## 2020-09-16 NOTE — Assessment & Plan Note (Signed)
Well-controlled on current medication regimen 

## 2020-09-16 NOTE — Patient Instructions (Signed)
It was great seeing you today!  I am glad you are doing okay but I am sorry you are still having back pain.  Your hemoglobin A1c was 6.2 which is wonderful.  We are going to give you the pneumonia vaccine today but she can get the Shingrix vaccine at any pharmacy.  If you need anything please let me know.  I hope you have a wonderful afternoon!

## 2020-09-16 NOTE — Assessment & Plan Note (Deleted)
HbA1C is 6.2 today Diabetic foot exam revealed slight decreased sensation in right heel, continue checking feet regularly Schedule appointment with opthalmologic for eye exam Continue current medication regiment and dietary/exercise changes

## 2020-09-17 ENCOUNTER — Other Ambulatory Visit: Payer: Self-pay

## 2020-09-17 ENCOUNTER — Ambulatory Visit: Payer: Medicaid Other

## 2020-09-17 DIAGNOSIS — G629 Polyneuropathy, unspecified: Secondary | ICD-10-CM

## 2020-09-17 DIAGNOSIS — M79661 Pain in right lower leg: Secondary | ICD-10-CM | POA: Diagnosis not present

## 2020-09-17 DIAGNOSIS — M6281 Muscle weakness (generalized): Secondary | ICD-10-CM

## 2020-09-17 DIAGNOSIS — R2689 Other abnormalities of gait and mobility: Secondary | ICD-10-CM

## 2020-09-17 DIAGNOSIS — G8929 Other chronic pain: Secondary | ICD-10-CM

## 2020-09-17 DIAGNOSIS — M545 Low back pain, unspecified: Secondary | ICD-10-CM

## 2020-09-17 LAB — LIPID PANEL
Chol/HDL Ratio: 3.6 ratio (ref 0.0–5.0)
Cholesterol, Total: 119 mg/dL (ref 100–199)
HDL: 33 mg/dL — ABNORMAL LOW (ref >39)
LDL Chol Calc (NIH): 66 mg/dL (ref 0–99)
Triglycerides: 104 mg/dL (ref 0–149)
VLDL Cholesterol Cal: 20 mg/dL (ref 5–40)

## 2020-09-17 NOTE — Telephone Encounter (Signed)
Advanced Heart Failure Patient Advocate Encounter  Prior Authorization for Russell Collier has been approved.    PA# [id number] Effective dates: 09/03/20 through 09/03/21  Charlann Boxer, CPhT

## 2020-09-17 NOTE — Patient Instructions (Signed)
  [id number]
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
## 2020-09-19 ENCOUNTER — Ambulatory Visit (INDEPENDENT_AMBULATORY_CARE_PROVIDER_SITE_OTHER): Payer: Medicaid Other | Admitting: Podiatry

## 2020-09-19 ENCOUNTER — Other Ambulatory Visit: Payer: Self-pay

## 2020-09-19 DIAGNOSIS — M109 Gout, unspecified: Secondary | ICD-10-CM | POA: Diagnosis not present

## 2020-09-19 DIAGNOSIS — E1151 Type 2 diabetes mellitus with diabetic peripheral angiopathy without gangrene: Secondary | ICD-10-CM

## 2020-09-19 DIAGNOSIS — B351 Tinea unguium: Secondary | ICD-10-CM

## 2020-09-19 MED ORDER — DEXAMETHASONE SODIUM PHOSPHATE 120 MG/30ML IJ SOLN: 2.0000 mg | Freq: Once | INTRAMUSCULAR | Status: DC

## 2020-09-19 NOTE — Progress Notes (Signed)
  Subjective:  Patient ID: Russell Collier, male    DOB: [birth year],  MRN: [id number]  Chief Complaint  Patient presents with   Nail Problem    RFC Nail trim bilateral nails 1-5    Gout    Follow up gout flare up. Right foot. Pt states improvement.     60 y.o. male presents with the above complaint. History confirmed with patient. Requests new injection in his foot and care of his nails.  Objective:  Physical Exam: warm, good capillary refill, no trophic changes or ulcerative lesions, normal DP and PT pulses and normal sensory exam.  Right Foot: POP R 1st MPJ, resolving erythema, no active warmth. Prominent HAV deformity.  Assessment:   1. Gout, arthritis   2. Type 2 diabetes mellitus with diabetic peripheral angiopathy without gangrene, without long-term current use of insulin ([place])   3. Onychomycosis    Plan:  Patient was evaluated and treated and all questions answered.  Gouty R 1st MPJ -Injection delivered as below  Procedure: Joint Injection Location: Right 1st MPJ joint Skin Prep: Alcohol. Injectate: 0.5 cc 1% lidocaine plain, 0.5 cc betamethasone acetate-betamethasone sodium phosphate Disposition: Patient tolerated procedure well. Injection site dressed with a band-aid.  DM with Onychomycosis, PAD -Nails debrided x10  Procedure: Nail Debridement Type of Debridement: manual, sharp debridement. Instrumentation: Nail nipper, rotary burr. Number of Nails: 10   No follow-ups on file.

## 2020-09-20 LAB — BASIC METABOLIC PANEL
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
## 2020-09-22 ENCOUNTER — Ambulatory Visit (HOSPITAL_COMMUNITY)
Admission: RE | Admit: 2020-09-22 | Discharge: 2020-09-22 | Disposition: A | Discharge status: Home / Self Care | Payer: Medicaid Other | Source: Ambulatory Visit | Attending: Family Medicine | Admitting: Family Medicine

## 2020-09-22 ENCOUNTER — Encounter (HOSPITAL_COMMUNITY): Payer: Self-pay

## 2020-09-22 ENCOUNTER — Telehealth (HOSPITAL_COMMUNITY): Payer: Self-pay

## 2020-09-22 ENCOUNTER — Other Ambulatory Visit (HOSPITAL_COMMUNITY): Payer: Self-pay

## 2020-09-22 ENCOUNTER — Other Ambulatory Visit: Payer: Self-pay

## 2020-09-22 VITALS — BP 102/60 | HR 98 | Wt 297.2 lb

## 2020-09-22 DIAGNOSIS — E119 Type 2 diabetes mellitus without complications: Secondary | ICD-10-CM | POA: Insufficient documentation

## 2020-09-22 DIAGNOSIS — Z7984 Long term (current) use of oral hypoglycemic drugs: Secondary | ICD-10-CM | POA: Insufficient documentation

## 2020-09-22 DIAGNOSIS — F17210 Nicotine dependence, cigarettes, uncomplicated: Secondary | ICD-10-CM | POA: Diagnosis not present

## 2020-09-22 DIAGNOSIS — I43 Cardiomyopathy in diseases classified elsewhere: Secondary | ICD-10-CM | POA: Diagnosis not present

## 2020-09-22 DIAGNOSIS — E669 Obesity, unspecified: Secondary | ICD-10-CM | POA: Insufficient documentation

## 2020-09-22 DIAGNOSIS — Z86711 Personal history of pulmonary embolism: Secondary | ICD-10-CM | POA: Diagnosis not present

## 2020-09-22 DIAGNOSIS — G4733 Obstructive sleep apnea (adult) (pediatric): Secondary | ICD-10-CM

## 2020-09-22 DIAGNOSIS — Z833 Family history of diabetes mellitus: Secondary | ICD-10-CM | POA: Insufficient documentation

## 2020-09-22 DIAGNOSIS — Z8249 Family history of ischemic heart disease and other diseases of the circulatory system: Secondary | ICD-10-CM | POA: Insufficient documentation

## 2020-09-22 DIAGNOSIS — I2510 Atherosclerotic heart disease of native coronary artery without angina pectoris: Secondary | ICD-10-CM | POA: Diagnosis not present

## 2020-09-22 DIAGNOSIS — Z86718 Personal history of other venous thrombosis and embolism: Secondary | ICD-10-CM | POA: Diagnosis not present

## 2020-09-22 DIAGNOSIS — I110 Hypertensive heart disease with heart failure: Secondary | ICD-10-CM | POA: Insufficient documentation

## 2020-09-22 DIAGNOSIS — Z7901 Long term (current) use of anticoagulants: Secondary | ICD-10-CM | POA: Diagnosis not present

## 2020-09-22 DIAGNOSIS — R002 Palpitations: Secondary | ICD-10-CM | POA: Diagnosis not present

## 2020-09-22 DIAGNOSIS — Z6841 Body Mass Index (BMI) 40.0 and over, adult: Secondary | ICD-10-CM | POA: Diagnosis not present

## 2020-09-22 DIAGNOSIS — Z888 Allergy status to other drugs, medicaments and biological substances status: Secondary | ICD-10-CM | POA: Insufficient documentation

## 2020-09-22 DIAGNOSIS — Z720 Tobacco use: Secondary | ICD-10-CM

## 2020-09-22 DIAGNOSIS — I25110 Atherosclerotic heart disease of native coronary artery with unstable angina pectoris: Secondary | ICD-10-CM | POA: Diagnosis not present

## 2020-09-22 DIAGNOSIS — Z955 Presence of coronary angioplasty implant and graft: Secondary | ICD-10-CM | POA: Insufficient documentation

## 2020-09-22 DIAGNOSIS — Z79899 Other long term (current) drug therapy: Secondary | ICD-10-CM | POA: Diagnosis not present

## 2020-09-22 DIAGNOSIS — I5032 Chronic diastolic (congestive) heart failure: Secondary | ICD-10-CM | POA: Diagnosis not present

## 2020-09-22 DIAGNOSIS — E1142 Type 2 diabetes mellitus with diabetic polyneuropathy: Secondary | ICD-10-CM | POA: Insufficient documentation

## 2020-09-22 DIAGNOSIS — E854 Organ-limited amyloidosis: Secondary | ICD-10-CM

## 2020-09-22 LAB — BASIC METABOLIC PANEL
Anion gap: 9 (ref 5–15)
BUN: 21 mg/dL (ref 8–23)
CO2: 26 mmol/L (ref 22–32)
Calcium: 9.1 mg/dL (ref 8.9–10.3)
Chloride: 102 mmol/L (ref 98–111)
Creatinine, Ser: 1.45 mg/dL — ABNORMAL HIGH (ref 0.61–1.24)
GFR, Estimated: 54 mL/min — ABNORMAL LOW (ref >60)
Glucose, Bld: 107 mg/dL — ABNORMAL HIGH (ref 70–99)
Potassium: 4.2 mmol/L (ref 3.5–5.1)
Sodium: 137 mmol/L (ref 135–145)

## 2020-09-22 LAB — CBC
HCT: 42.4 % (ref 39.0–52.0)
Hemoglobin: 13.9 g/dL (ref 13.0–17.0)
MCH: 28.2 pg (ref 26.0–34.0)
MCHC: 32.8 g/dL (ref 30.0–36.0)
MCV: 86 fL (ref 80.0–100.0)
Platelets: 244 10*3/uL (ref 150–400)
RBC: 4.93 MIL/uL (ref 4.22–5.81)
RDW: 20 % — ABNORMAL HIGH (ref 11.5–15.5)
WBC: 10.3 10*3/uL (ref 4.0–10.5)
nRBC: 0 % (ref 0.0–0.2)

## 2020-09-22 NOTE — Telephone Encounter (Signed)
Spoke to [name] and reminded him of his appointment with Dr. Haroldine Laws today at 10:00. He reports he is aware and will be there. I will follow up with him after appointment at home this afternoon. Call complete.

## 2020-09-22 NOTE — Patient Instructions (Signed)
It was great to see you today! No medication changes are needed at this time.   Labs today We will only contact you if something comes back abnormal or we need to make some changes. Otherwise no news is good news!  Your physician recommends that you schedule a follow-up appointment in: 6 months with Dr Haroldine Laws  Do the following things EVERYDAY: Weigh yourself in the morning before breakfast. Write it down and keep it in a log. Take your medicines as prescribed Eat low salt foods--Limit salt (sodium) to 2000 mg per day.  Stay as active as you can everyday Limit all fluids for the day to less than 2 liters  milAt the Advanced Heart Failure Clinic, you and your health needs are our priority. As part of our continuing mission to provide you with exceptional heart care, we have created designated Provider Care Teams. These Care Teams include your primary Cardiologist (physician) and Advanced Practice Providers (APPs- Physician Assistants and Nurse Practitioners) who all work together to provide you with the care you need, when you need it.   You may see any of the following providers on your designated Care Team at your next follow up: Dr Glori Bickers Dr Loralie Champagne Dr [name], NP Lyda Jester, [place] Ginnie Smart Audry Riles, PharmD   Please be sure to bring in all your medications bottles to every appointment.

## 2020-09-22 NOTE — Progress Notes (Signed)
Paramedicine Encounter    Patient ID: Russell Collier, male    DOB: 11-23-1960, 59 y.o.   MRN: [id number]  Met with [name] in the home following his appointment in the clinic today where we reviewed his note from visit, medications and filled two weeks of pill boxes. No med changes made. Visit complete and I will see [name] in two weeks.   Refills for next week- Isosorbide Potassium  ACTION: Home visit completed

## 2020-09-23 ENCOUNTER — Ambulatory Visit: Payer: Medicaid Other | Attending: Orthopaedic Surgery

## 2020-09-23 DIAGNOSIS — M545 Low back pain, unspecified: Secondary | ICD-10-CM | POA: Insufficient documentation

## 2020-09-23 DIAGNOSIS — M79661 Pain in right lower leg: Secondary | ICD-10-CM | POA: Diagnosis present

## 2020-09-23 DIAGNOSIS — R2689 Other abnormalities of gait and mobility: Secondary | ICD-10-CM | POA: Insufficient documentation

## 2020-09-23 DIAGNOSIS — M6281 Muscle weakness (generalized): Secondary | ICD-10-CM | POA: Diagnosis present

## 2020-09-23 DIAGNOSIS — G8929 Other chronic pain: Secondary | ICD-10-CM | POA: Insufficient documentation

## 2020-09-23 DIAGNOSIS — M25561 Pain in right knee: Secondary | ICD-10-CM | POA: Insufficient documentation

## 2020-09-23 DIAGNOSIS — G629 Polyneuropathy, unspecified: Secondary | ICD-10-CM | POA: Diagnosis present

## 2020-09-23 NOTE — Patient Instructions (Signed)
  [id number]
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
## 2020-09-26 DIAGNOSIS — M5136 Other intervertebral disc degeneration, lumbar region: Secondary | ICD-10-CM | POA: Diagnosis not present

## 2020-09-27 DIAGNOSIS — M5136 Other intervertebral disc degeneration, lumbar region: Secondary | ICD-10-CM | POA: Diagnosis not present

## 2020-09-28 DIAGNOSIS — M5136 Other intervertebral disc degeneration, lumbar region: Secondary | ICD-10-CM | POA: Diagnosis not present

## 2020-09-29 ENCOUNTER — Other Ambulatory Visit: Payer: Self-pay

## 2020-09-29 ENCOUNTER — Ambulatory Visit: Payer: Medicaid Other

## 2020-09-29 DIAGNOSIS — M79661 Pain in right lower leg: Secondary | ICD-10-CM

## 2020-09-29 DIAGNOSIS — R2689 Other abnormalities of gait and mobility: Secondary | ICD-10-CM

## 2020-09-29 DIAGNOSIS — G8929 Other chronic pain: Secondary | ICD-10-CM

## 2020-09-29 DIAGNOSIS — M5136 Other intervertebral disc degeneration, lumbar region: Secondary | ICD-10-CM | POA: Diagnosis not present

## 2020-09-29 DIAGNOSIS — M6281 Muscle weakness (generalized): Secondary | ICD-10-CM

## 2020-09-29 DIAGNOSIS — M545 Low back pain, unspecified: Secondary | ICD-10-CM

## 2020-09-29 DIAGNOSIS — G629 Polyneuropathy, unspecified: Secondary | ICD-10-CM
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
## 2020-09-29 NOTE — Patient Instructions (Signed)
Reviewed entire HEP with pt, discussing proper form.

## 2020-09-30 DIAGNOSIS — M5136 Other intervertebral disc degeneration, lumbar region: Secondary | ICD-10-CM | POA: Diagnosis not present

## 2020-10-01 ENCOUNTER — Telehealth: Payer: Self-pay | Admitting: Pharmacist

## 2020-10-01 ENCOUNTER — Other Ambulatory Visit: Payer: Self-pay

## 2020-10-01 ENCOUNTER — Other Ambulatory Visit: Payer: Self-pay | Admitting: Family Medicine

## 2020-10-01 ENCOUNTER — Other Ambulatory Visit (HOSPITAL_COMMUNITY): Payer: Self-pay

## 2020-10-01 ENCOUNTER — Other Ambulatory Visit: Payer: Self-pay | Admitting: *Deleted

## 2020-10-01 DIAGNOSIS — M5136 Other intervertebral disc degeneration, lumbar region: Secondary | ICD-10-CM | POA: Diagnosis not present

## 2020-10-01 DIAGNOSIS — H539 Unspecified visual disturbance: Secondary | ICD-10-CM

## 2020-10-01 NOTE — Telephone Encounter (Signed)
Patient contacted at the request of his care provider, Glenda Chroman or follow/up of tobacco intake reduction attempt.   Since last contact patient reports Increased smoking to 1 ppd.  He states he has 4 packs in his possession currently.   He is not a great candidate for any of our previous therapies due to his intolerance or lack of success with the therapies which decrease his belief that they help.   Medications currently being used; None  Rates IMPORTANCE of quitting tobacco as high but remains triggered by boredom and habit.     Motivation to quit: Breathing and Health.    Total time with patient call and documentation of interaction: 22 minutes.  F/U Phone call planned: 2 weeks - plan to decrease intake to 1/2 ppd or less at that follow-up.

## 2020-10-01 NOTE — Patient Instructions (Signed)
Visit Information  Mr. Vermeer was given information about Medicaid Managed Care team care coordination services as a part of their [place] Medicaid benefit. [name] verbally consented to engagement with the [facility] Managed Care team.   If you are experiencing a medical emergency, please call 911 or report to your local emergency department or urgent care.   If you have a non-emergency medical problem during routine business hours, please contact your provider's office and ask to speak with a nurse.   For questions related to your [facility], please call: [phone] or visit the homepage here: [email or web address]  If you would like to schedule transportation through your [facility], please call the following number at least 2 days in advance of your appointment: [phone].   Call the [facility] at [phone], at any time, 24 hours a day, 7 days a week. If you are in danger or need immediate medical attention call 911.  If you would like help to quit smoking, call 1-800-QUIT-NOW (458)201-9150) OR Espaol: 1-855-Djelo-Ya HD:1601594) o para ms informacin haga clic aqu or Text READY to 200-400 to register via text  [name] - following are the goals we discussed in your visit today:   Goals Addressed             This Visit's Progress    Protect My Health       Timeframe:  Long-Range Goal Priority:  Medium Start Date:    07/11/20                         Expected End Date:   10/31/20                  Follow Up Date 10/31/20   - Call ([phone]) or go online [email or web address] for [name] and [name] vouchers offered by [employer] - schedule recommended health tests (blood work, mammogram, colonoscopy, pap test) - attend all scheduled  appointments - work on maintaining a diabetic diet, compliant with gout restrictions - work on smoking cessation, call 1-800-QUIT-NOW or contact [place] [phone] for assistance - work with Pharmacist, Janeann Forehand for assistance with smoking cessation    Why is this important?   Screening tests can find diseases early when they are easier to treat.  Your doctor or nurse will talk with you about which tests are important for you.  Getting shots for common diseases like the flu and shingles will help prevent them.             Please see education materials related to diet and pain provided as print materials.   The patient verbalized understanding of instructions provided today and agreed to receive a mailed copy of patient instruction and/or educational materials.  Telephone follow up appointment with Managed Medicaid care management team member scheduled for:10/31/20 @ 10:30am  [name] RN, BSN [place] RN Care Coordinator   Following is a copy of your plan of care:  Patient Care Plan: Medication Management     Problem Identified: Health Promotion or Disease Self-Management (General Plan of Care)      Goal: Medication Management   Note:   Current Barriers:  Unable to independently monitor therapeutic efficacy Unable to self administer medications as prescribed   Pharmacist Clinical Goal(s):  Over the next 30 days, patient will achieve adherence to monitoring guidelines and medication adherence to achieve therapeutic efficacy  through collaboration with PharmD and provider.    Interventions: Inter-disciplinary care team collaboration (see longitudinal plan of care) Comprehensive medication review performed; medication list updated in electronic medical record  '@[email or web address]'$ @ Health Maintenance  Patient Goals/Self-Care Activities Over the next 30 days, patient will:  - take medications as prescribed collaborate with provider on  medication access solutions  Follow Up Plan: The care management team will reach out to the patient again over the next 30 days.       Patient Care Plan: General Plan of Care (Adult)     Problem Identified: Health Promotion or Disease Self-Management (General Plan of Care)      Long-Range Goal: Self-Management Plan Developed   Start Date: 07/11/2020  Expected End Date: 10/31/2020  Recent Progress: On track  Priority: Medium  Note:   Current Barriers:  Ineffective Self Health Maintenance-Mr. Docherty is managing multiple health issues. He recently had back surgery and is having trouble sleeping due to needing a bed. He is managing right knee pain, in which he is waiting on the scheduling of an MRI. He would like to start PT and work on weight loss. He is managing diabetes with diet alone, recent A1C 6.0. He is wearing compression socks and having a hard time putting them on. Mr. Lipper is working on smoking cessation and has cut back to 1/2 pack a day. Mr. Sanjurjo has started PT for degenerative joint disease. He is waiting on [facility] agency to have adequate staffing for PCS. He is utilizing medical transportation provided by [facility]. Patient reports smoking a little more, now 1/2-1 pack a day. He would like to quit, but feels like he has made an improvement in his health by cutting out narcotics. Mr. Crismon receives Paramedicine assistance and is very thankful for this help.-Update-Mr. Heitzmann is now receiving PCS 7 days a week, which is very helpful. He is very thankful for the help and support of everyone. He would like to work on smoking cessation and is requesting RNCM reach out to [place] for assistance. He "graduated" from PT and will start aquatic therapy 8/17. Continues to try to lose weight, reports a weight loss of 55# over the last year. Lacks social connections Unable to perform ADLs independently Does not contact provider office for questions/concerns Currently UNABLE TO independently self manage  needs related to chronic health conditions.  Knowledge Deficits related to short term plan for care coordination needs and long term plans for chronic disease management needs Nurse Case Manager Clinical Goal(s):  patient will work with care management team to address care coordination and chronic disease management needs related to knee pain and recent back surgery   Interventions:  Evaluation of current treatment plan related to knee pain and patient's adherence to plan as established by provider. Collaborated with PCP for Ophthalmology referral Provided education to patient re: healthy diet Discussed plans with patient for ongoing care management follow up and provided patient with direct contact information for care management team Encouraged patient to contact [facility] for Healthy Rewards and Weight Watchers vouchers Advised patient to schedule follow up appointment with Orthopedic Surgeon Discussed smoking cessation, information provided to patient Collaborated with Janeann Forehand, Pharmacist for his continues assistance with smoking cessation Self Care Activities:  Patient will self administer medications as prescribed Patient will attend all scheduled provider appointments Patient will call pharmacy for medication refills Patient will call provider office for new concerns or questions Patient Goals: - Call ([phone]) or go online [email or web address] for [name] and [employer]  Watchers vouchers offered by [employer] - schedule recommended health tests (blood work, mammogram, colonoscopy, pap test) - attend all scheduled appointments - work on maintaining a diabetic diet, compliant with gout restrictions - work on smoking cessation, call 1-800-QUIT-NOW or contact [place] [phone] for assistance - work with Pharmacist, Janeann Forehand for assistance with smoking cessation Follow Up Plan: Telephone follow up appointment with  care management team member scheduled for:10/31/20 @ 10:30am

## 2020-10-01 NOTE — Telephone Encounter (Signed)
-----   Message from [name], RN sent at 10/01/2020 10:47 AM EDT ----- Hello,   This patient was unable to answer your last phone call to him. He is interested in your assistance with smoking cessation and asked if you could reach out to him again. Thank you!  [name] RN, BSN [place]  [employer] manager

## 2020-10-01 NOTE — Patient Outreach (Signed)
Medicaid Managed Care   Nurse Care Manager Note  10/01/2020 Name:  Russell Collier MRN:  [id number] DOB:  11-19-1960  Russell Collier is an 60 y.o. year old male who is a primary patient of [name], MD.  The [facility] Managed Care Coordination team was consulted for assistance with:    Recent back surgery  Russell Collier was given information about Medicaid Managed Care Coordination team services today. Russell Collier Patient agreed to services and verbal consent obtained.  Engaged with patient by telephone for follow up visit in response to provider referral for case management and/or care coordination services.   Assessments/Interventions:  Review of past medical history, allergies, medications, health status, including review of consultants reports, laboratory and other test data, was performed as part of comprehensive evaluation and provision of chronic care management services.  SDOH (Social Determinants of Health) assessments and interventions performed:   Care Plan  Allergies  Allergen Reactions   Varenicline Other (See Comments)    Patient reports laryngospasm stopped in the ER   Bupropion Other (See Comments)    Headache - moderate/severe - self discontinued agent     Medications Reviewed Today     Reviewed by [name], RN (Registered Nurse) on 10/01/20 at 1012  Med List Status: <None>   Medication Order Taking? Sig Documenting Provider Last Dose Status Informant  Accu-Chek Softclix Lancets lancets [id number] No Use as instructed [name], MD Taking Active   albuterol (PROVENTIL) (2.5 MG/3ML) 0.083% nebulizer solution [id number] No Take 2.5 mg by nebulization every 6 (six) hours as needed for wheezing or shortness of breath. [provider] Taking Active Multiple Informants  albuterol (VENTOLIN HFA) 108 (90 Base) MCG/ACT inhaler [id number] No Inhale 2 puffs into the lungs every 6 (six) hours as needed for wheezing or shortness of breath. [name], MD Taking Active   apixaban (ELIQUIS) 2.5 MG TABS tablet [id number] No Take 1 tablet (2.5 mg total) by mouth 2 (two) times daily. [name], MD Taking Active   atorvastatin (LIPITOR) 80 MG tablet [id number] No Take 1 tablet (80 mg total) by mouth daily. [name], MD Taking Active   baclofen (LIORESAL) 10 MG tablet [id number] No Take 0.5-1 tablets (5-10 mg total) by mouth 3 (three) times daily as needed.  Patient not taking: Reported on 09/22/2020   [name], MD Not Taking Active   Blood Glucose Monitoring Suppl (ACCU-CHEK GUIDE) w/Device KIT [id number] No 1 Units by Does not apply route daily. [name], MD Taking Active   dapagliflozin propanediol (FARXIGA) 10 MG TABS tablet [id number] No Take 1 tablet (10 mg total) by mouth daily before breakfast. [name], MD Taking Active   dexamethasone (DECADRON) injection 2 mg [id number]   [name], DPM  Active   enalapril (VASOTEC) 20 MG tablet [id number] No Take 1 tablet (20 mg total) by mouth at bedtime. [name], MD Taking Active   fluticasone [facility]) 50 MCG/ACT nasal spray [id number] No Place 1 spray into both nostrils daily. [name], MD Taking Active   furosemide (LASIX) 40 MG tablet [id number] No Take 1 tablet (40 mg total) by mouth 2 (two) times daily. [name], MD Taking Active   glucose blood (ACCU-CHEK GUIDE) test strip [id number] No Use as instructed [name], MD Taking Active   isosorbide mononitrate (IMDUR) 30 MG 24 hr tablet [id number] No Take 1 tablet (30 mg total) by mouth daily. [name], MD Taking Active   Multiple Vitamin (MULTIVITAMIN WITH MINERALS) TABS tablet [id number] No  Take 1 tablet by mouth daily. [provider] Taking Active   pantoprazole (PROTONIX) 40 MG tablet [id number] No Take 1 tablet (40 mg total) by mouth daily. [name], MD Taking Active   potassium chloride SA (KLOR-CON) 20 MEQ tablet [id number] No TAKE 1 TABLET(20 MEQ) BY  MOUTH DAILY [name], MD Taking Active   pregabalin (LYRICA) 100 MG capsule [id number] No TAKE ONE CAPSULE BY MOUTH THREE TIMES DAILY McDiarmid, Blane Ohara, MD Taking Active   Tafamidis 61 MG CAPS [id number] No TAKE 1 CAPSULE BY MOUTH DAILY. [name], [name], MD Taking Active   umeclidinium-vilanterol [facility] ELLIPTA) 62.5-25 MCG/INH AEPB [id number] No Inhale 1 puff into the lungs daily as needed. [name], MD Taking Active   valACYclovir (VALTREX) 500 MG tablet [id number] No Take 1 tablet (500 mg total) by mouth daily. 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   COPD exacerbation ([place]) 03/20/2020   Type 2 diabetes mellitus with diabetic peripheral angiopathy without gangrene, without long-term current use of insulin ([place]) 03/20/2020   Coagulation defect ([place]) 12/11/2019   Fatigue 12/10/2019   Leg cramps 12/10/2019   Mild epistaxis 10/30/2019   Disc degeneration, 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   Ganglion of right knee 09/27/2019   Lumbar radiculopathy 09/27/2019   Lower extremity edema    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[place])    Acute respiratory failure ([place]) 07/27/2019   Primary osteoarthritis of right knee 07/24/2019   Breast tenderness in male 05/31/2019   Chronic pain 05/31/2019   AKI (acute kidney injury) ([place]) 05/31/2019   Pain due to onychomycosis of toenails of both feet 05/16/2019   Chronic knee pain 04/16/2019   Diabetes ([place]) 04/16/2019   Esophageal reflux 02/06/2019   Heartburn 02/06/2019   Chronic skin ulcer of lower leg ([place]) 12/21/2018   S/P drug eluting coronary stent placement    Coronary artery disease involving native coronary artery of native heart with unstable angina pectoris ([place]) 12/20/2018   Unstable angina (HCC)    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   Acute on chronic diastolic heart failure (HCC)    Shortness of breath     Precordial chest pain    Idiopathic peripheral neuropathy    Palpitations    Chronic diastolic heart failure (HCC)    Abnormal ankle brachial index (ABI)    Chest pain, rule out acute myocardial infarction 09/26/2018   History of cocaine abuse ([place]) 08/20/2018   Marijuana use 08/20/2018   Morbid obesity with BMI of 40.0-44.9, adult ([place]) 08/20/2018   Dyspepsia    Morbid obesity (HCC)    OSA on CPAP    Chest pain 05/30/2018   CAD (coronary artery disease) 03/30/2018   Elevated troponin 02/03/2018   Positive urine drug screen 02/03/2018   Tobacco abuse 02/03/2018   Hyperlipidemia 02/03/2018   Acute respiratory failure with hypoxia ([place]) 02/02/2018   Acute congestive heart failure (HCC)    Keratoconjunctivitis sicca of left eye not specified as Sjogren's 09/06/2017   Long term current use of oral hypoglycemic drug 09/06/2017   Mechanical ectropion of left lower eyelid 09/06/2017   Nuclear sclerotic cataract of both eyes 09/06/2017   Refractive amblyopia, left 09/06/2017   Type 2 diabetes mellitus without complication, without long-term current use of insulin ([place]) 09/06/2017   Hyperopia of both eyes with astigmatism and presbyopia 09/06/2017   Atypical  chest pain 12/27/2015   Essential hypertension    Neuropathy     Conditions to be addressed/monitored per PCP order:   recent back surgery  Care Plan : General Plan of Care (Adult)  Updates made by [name], RN since 10/01/2020 12:00 AM     Problem: Health Promotion or Disease Self-Management (General Plan of Care)      Long-Range Goal: Self-Management Plan Developed   Start Date: 07/11/2020  Expected End Date: 10/31/2020  Recent Progress: On track  Priority: Medium  Note:   Current Barriers:  Ineffective Self Health Maintenance-Mr. Suen is managing multiple health issues. He recently had back surgery and is having trouble sleeping due to needing a bed. He is managing right knee pain, in which he is waiting on the  scheduling of an MRI. He would like to start PT and work on weight loss. He is managing diabetes with diet alone, recent A1C 6.0. He is wearing compression socks and having a hard time putting them on. Mr. Sage is working on smoking cessation and has cut back to 1/2 pack a day. Mr. Cadenhead has started PT for degenerative joint disease. He is waiting on [facility] agency to have adequate staffing for PCS. He is utilizing medical transportation provided by [facility]. Patient reports smoking a little more, now 1/2-1 pack a day. He would like to quit, but feels like he has made an improvement in his health by cutting out narcotics. Mr. Grimm receives Paramedicine assistance and is very thankful for this help.-Update-Mr. Hosley is now receiving PCS 7 days a week, which is very helpful. He is very thankful for the help and support of everyone. He would like to work on smoking cessation and is requesting RNCM reach out to [place] for assistance. He "graduated" from PT and will start aquatic therapy 8/17. Continues to try to lose weight, reports a weight loss of 55# over the last year. Lacks social connections Unable to perform ADLs independently Does not contact provider office for questions/concerns Currently UNABLE TO independently self manage needs related to chronic health conditions.  Knowledge Deficits related to short term plan for care coordination needs and long term plans for chronic disease management needs Nurse Case Manager Clinical Goal(s):  patient will work with care management team to address care coordination and chronic disease management needs related to knee pain and recent back surgery   Interventions:  Evaluation of current treatment plan related to knee pain and patient's adherence to plan as established by provider. Collaborated with PCP for Ophthalmology referral Provided education to patient re: healthy diet Discussed plans with patient for ongoing care management follow up and provided patient with  direct contact information for care management team Encouraged patient to contact [name] for Healthy Rewards and Weight [employer]chers vouchers Advised patient to schedule follow up appointment with Orthopedic Surgeon Discussed smoking cessation, information provided to patient Collaborated with Janeann Forehand, Pharmacist for his continues assistance with smoking cessation Self Care Activities:  Patient will self administer medications as prescribed Patient will attend all scheduled provider appointments Patient will call pharmacy for medication refills Patient will call provider office for new concerns or questions Patient Goals: - Call [phone]) or go online [email or web address] for [name] and [name] vouchers offered by [employer] - schedule recommended health tests (blood work, mammogram, colonoscopy, pap test) - attend all scheduled appointments - work on maintaining a diabetic diet, compliant with gout restrictions - work on smoking cessation, call 1-800-QUIT-NOW or contact [place] [phone] for  assistance - work with Pharmacist, Janeann Forehand for assistance with smoking cessation Follow Up Plan: Telephone follow up appointment with care management team member scheduled for:10/31/20 @ 10:30am      Follow Up:  Patient agrees to Care Plan and Follow-up.  Plan: The Managed Medicaid care management team will reach out to the patient again over the next 30 days.  Date/time of next scheduled RN care management/care coordination outreach:  10/31/20 @ 10:30am  [name] RN, [place] RN Care Coordinator

## 2020-10-02 ENCOUNTER — Other Ambulatory Visit (HOSPITAL_COMMUNITY): Payer: Self-pay

## 2020-10-02 ENCOUNTER — Telehealth (HOSPITAL_COMMUNITY): Payer: Self-pay

## 2020-10-02 DIAGNOSIS — M5136 Other intervertebral disc degeneration, lumbar region: Secondary | ICD-10-CM | POA: Diagnosis not present

## 2020-10-02 MED FILL — Tafamidis Cap 61 MG: ORAL | 30 days supply | Qty: 30 | Fill #4 | Status: AC

## 2020-10-02 NOTE — Telephone Encounter (Signed)
Spoke to [name] who advised [facility] pill packs will be delivered today. I will follow up with patient Monday.

## 2020-10-03 ENCOUNTER — Other Ambulatory Visit (HOSPITAL_COMMUNITY): Payer: Self-pay

## 2020-10-03 DIAGNOSIS — M5136 Other intervertebral disc degeneration, lumbar region: Secondary | ICD-10-CM | POA: Diagnosis not present

## 2020-10-04 DIAGNOSIS — M5136 Other intervertebral disc degeneration, lumbar region: Secondary | ICD-10-CM | POA: Diagnosis not present

## 2020-10-05 DIAGNOSIS — M5136 Other intervertebral disc degeneration, lumbar region: Secondary | ICD-10-CM | POA: Diagnosis not present

## 2020-10-06 ENCOUNTER — Other Ambulatory Visit (HOSPITAL_COMMUNITY): Payer: Self-pay

## 2020-10-06 DIAGNOSIS — M5136 Other intervertebral disc degeneration, lumbar region: Secondary | ICD-10-CM | POA: Diagnosis not present

## 2020-10-06 NOTE — Progress Notes (Signed)
Paramedicine Encounter    Patient ID: Torey Reinard, male    DOB: [birth year], 60 y.o.   MRN: [id number]   Met with [name] for final visit today to verify pill packs with [facility]. Pill packs confirmed and coached Delvin on how to use. [name] and I discussed graduation from paramedicine and he was grateful for the assistance. He has clinic information if appointment or needs assistance. Discharge complete.   Patient is now discharged from [facility].  Patient has/has not met the following goals:  Yes :Patient expresses basic understanding of medications and what they are for Yes :Patient able to verbalize heart failure specific dietary/fluid restrictions Yes :Patient is aware of who to call if they have medical concerns or if they need to schedule or change appts Yes :Patient has a scale for daily weights and weighs regularly Yes :Patient able to verbalize concerning symptoms when they should call the HF clinic (weight gain ranges, etc) Yes :Patient has a PCP and has seen within the past year or has upcoming appt Yes :Patient has reliable access to getting their medications Yes :Patient has shown they are able to reorder medications reliably Yes :Patient has had admission in past 30 days- if yes how many? No :Patient has had admission in past 90 days- if yes how many?  Discharge Comments:       ACTION: Home visit completed

## 2020-10-07 DIAGNOSIS — M5136 Other intervertebral disc degeneration, lumbar region: Secondary | ICD-10-CM | POA: Diagnosis not present

## 2020-10-08 ENCOUNTER — Ambulatory Visit (HOSPITAL_BASED_OUTPATIENT_CLINIC_OR_DEPARTMENT_OTHER): Payer: Medicaid Other | Attending: Orthopaedic Surgery | Admitting: Physical Therapy

## 2020-10-08 ENCOUNTER — Other Ambulatory Visit: Payer: Self-pay

## 2020-10-08 ENCOUNTER — Encounter (HOSPITAL_BASED_OUTPATIENT_CLINIC_OR_DEPARTMENT_OTHER): Payer: Self-pay | Admitting: Physical Therapy

## 2020-10-08 DIAGNOSIS — G8929 Other chronic pain: Secondary | ICD-10-CM | POA: Insufficient documentation

## 2020-10-08 DIAGNOSIS — M79661 Pain in right lower leg: Secondary | ICD-10-CM | POA: Diagnosis present

## 2020-10-08 DIAGNOSIS — G629 Polyneuropathy, unspecified: Secondary | ICD-10-CM | POA: Diagnosis present

## 2020-10-08 DIAGNOSIS — M25561 Pain in right knee: Secondary | ICD-10-CM | POA: Diagnosis present

## 2020-10-08 DIAGNOSIS — M545 Low back pain, unspecified: Secondary | ICD-10-CM | POA: Insufficient documentation

## 2020-10-08 DIAGNOSIS — R2689 Other abnormalities of gait and mobility: Secondary | ICD-10-CM | POA: Insufficient documentation

## 2020-10-08 DIAGNOSIS — M5136 Other intervertebral disc degeneration, lumbar region: Secondary | ICD-10-CM | POA: Diagnosis not present

## 2020-10-08 DIAGNOSIS — M6281 Muscle weakness (generalized): Secondary | ICD-10-CM | POA: Insufficient documentation

## 2020-10-08 DIAGNOSIS — R258 Other abnormal involuntary movements: Secondary | ICD-10-CM | POA: Diagnosis present
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
## 2020-10-09 ENCOUNTER — Telehealth: Payer: Self-pay | Admitting: Family Medicine

## 2020-10-09 DIAGNOSIS — M5136 Other intervertebral disc degeneration, lumbar region: Secondary | ICD-10-CM | POA: Diagnosis not present

## 2020-10-09 NOTE — Telephone Encounter (Signed)
Clinical info completed on Handicap Placard form.  Placed form in Dr. Hulen Luster box for completion.  Ottis Stain, CMA

## 2020-10-09 NOTE — Telephone Encounter (Signed)
[place] [employer]ac[employer] form dropped off for at front desk for completion.  Verified that patient section of form has been completed.  Last DOS/WCC with PCP was 09/16/20.  Placed form in team folder to be completed by clinical staff.  Creig Hines

## 2020-10-10 DIAGNOSIS — M5136 Other intervertebral disc degeneration, lumbar region: Secondary | ICD-10-CM | POA: Diagnosis not present

## 2020-10-11 DIAGNOSIS — M5136 Other intervertebral disc degeneration, lumbar region: Secondary | ICD-10-CM | POA: Diagnosis not present

## 2020-10-12 DIAGNOSIS — M5136 Other intervertebral disc degeneration, lumbar region: Secondary | ICD-10-CM | POA: Diagnosis not present

## 2020-10-13 DIAGNOSIS — M5136 Other intervertebral disc degeneration, lumbar region: Secondary | ICD-10-CM | POA: Diagnosis not present

## 2020-10-14 DIAGNOSIS — M5136 Other intervertebral disc degeneration, lumbar region: Secondary | ICD-10-CM | POA: Diagnosis not present

## 2020-10-14 NOTE — Telephone Encounter (Signed)
Pt informed. Russell Collier, CMA  

## 2020-10-15 ENCOUNTER — Ambulatory Visit (HOSPITAL_BASED_OUTPATIENT_CLINIC_OR_DEPARTMENT_OTHER): Payer: Medicaid Other | Admitting: Physical Therapy

## 2020-10-15 DIAGNOSIS — M5136 Other intervertebral disc degeneration, lumbar region: Secondary | ICD-10-CM | POA: Diagnosis not present

## 2020-10-16 DIAGNOSIS — M5136 Other intervertebral disc degeneration, lumbar region: Secondary | ICD-10-CM | POA: Diagnosis not present

## 2020-10-17 ENCOUNTER — Telehealth: Payer: Self-pay | Admitting: Pharmacist

## 2020-10-17 DIAGNOSIS — M5136 Other intervertebral disc degeneration, lumbar region: Secondary | ICD-10-CM | POA: Diagnosis not present

## 2020-10-17 NOTE — Telephone Encounter (Signed)
Patient contacted for follow/up of tobacco intake reduction / tobacco cessation attempt.   Since last contact patient reports smoking about 10-11 per day States back pain continues to be problematic.   Medications currently being used; None.   Most common triggers to use tobacco include; stress, now back to drinking coffee and smoking.    Motivation to quit: breathing and health    Total time with patient call and documentation of interaction: 12 minutes.  F/U Phone call planned: 2 weeks

## 2020-10-17 NOTE — Telephone Encounter (Signed)
-----   Message from Leavy Cella, [place] sent at 10/01/2020  2:58 PM EDT ----- Regarding: Tobacco intake reduction to 10 cigs or less

## 2020-10-18 DIAGNOSIS — M5136 Other intervertebral disc degeneration, lumbar region: Secondary | ICD-10-CM | POA: Diagnosis not present

## 2020-10-19 DIAGNOSIS — M5136 Other intervertebral disc degeneration, lumbar region: Secondary | ICD-10-CM | POA: Diagnosis not present

## 2020-10-20 DIAGNOSIS — M5136 Other intervertebral disc degeneration, lumbar region: Secondary | ICD-10-CM | POA: Diagnosis not present

## 2020-10-21 DIAGNOSIS — M5136 Other intervertebral disc degeneration, lumbar region: Secondary | ICD-10-CM | POA: Diagnosis not present

## 2020-10-22 ENCOUNTER — Other Ambulatory Visit: Payer: Self-pay

## 2020-10-22 ENCOUNTER — Ambulatory Visit (HOSPITAL_BASED_OUTPATIENT_CLINIC_OR_DEPARTMENT_OTHER): Payer: Medicaid Other | Admitting: Physical Therapy

## 2020-10-22 DIAGNOSIS — M5136 Other intervertebral disc degeneration, lumbar region: Secondary | ICD-10-CM | POA: Diagnosis not present

## 2020-10-22 DIAGNOSIS — M79661 Pain in right lower leg: Secondary | ICD-10-CM | POA: Diagnosis not present

## 2020-10-22 DIAGNOSIS — M6281 Muscle weakness (generalized): Secondary | ICD-10-CM

## 2020-10-22 DIAGNOSIS — G8929 Other chronic pain: Secondary | ICD-10-CM

## 2020-10-22 DIAGNOSIS — R258 Other abnormal involuntary movements: Secondary | ICD-10-CM

## 2020-10-22 DIAGNOSIS — M545 Low back pain, unspecified: Secondary | ICD-10-CM

## 2020-10-22 DIAGNOSIS — R2689 Other abnormalities of gait and mobility: Secondary | ICD-10-CM

## 2020-10-22 DIAGNOSIS — G629 Polyneuropathy, unspecified: Secondary | ICD-10-CM
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
## 2020-10-23 DIAGNOSIS — M5136 Other intervertebral disc degeneration, lumbar region: Secondary | ICD-10-CM | POA: Diagnosis not present

## 2020-10-24 DIAGNOSIS — M5136 Other intervertebral disc degeneration, lumbar region: Secondary | ICD-10-CM | POA: Diagnosis not present

## 2020-10-25 DIAGNOSIS — M5136 Other intervertebral disc degeneration, lumbar region: Secondary | ICD-10-CM | POA: Diagnosis not present

## 2020-10-26 DIAGNOSIS — M5136 Other intervertebral disc degeneration, lumbar region: Secondary | ICD-10-CM | POA: Diagnosis not present

## 2020-10-27 DIAGNOSIS — M5136 Other intervertebral disc degeneration, lumbar region: Secondary | ICD-10-CM | POA: Diagnosis not present

## 2020-10-28 DIAGNOSIS — M5136 Other intervertebral disc degeneration, lumbar region: Secondary | ICD-10-CM | POA: Diagnosis not present

## 2020-10-29 ENCOUNTER — Other Ambulatory Visit: Payer: Self-pay

## 2020-10-29 ENCOUNTER — Ambulatory Visit: Payer: Medicaid Other | Attending: Orthopaedic Surgery

## 2020-10-29 DIAGNOSIS — M6281 Muscle weakness (generalized): Secondary | ICD-10-CM | POA: Diagnosis present

## 2020-10-29 DIAGNOSIS — M79661 Pain in right lower leg: Secondary | ICD-10-CM | POA: Insufficient documentation

## 2020-10-29 DIAGNOSIS — M545 Low back pain, unspecified: Secondary | ICD-10-CM | POA: Insufficient documentation

## 2020-10-29 DIAGNOSIS — R258 Other abnormal involuntary movements: Secondary | ICD-10-CM | POA: Insufficient documentation

## 2020-10-29 DIAGNOSIS — G8929 Other chronic pain: Secondary | ICD-10-CM | POA: Diagnosis present

## 2020-10-29 DIAGNOSIS — R2689 Other abnormalities of gait and mobility: Secondary | ICD-10-CM | POA: Insufficient documentation

## 2020-10-29 DIAGNOSIS — R262 Difficulty in walking, not elsewhere classified: Secondary | ICD-10-CM | POA: Diagnosis present

## 2020-10-29 DIAGNOSIS — M25561 Pain in right knee: Secondary | ICD-10-CM | POA: Insufficient documentation

## 2020-10-29 DIAGNOSIS — G629 Polyneuropathy, unspecified: Secondary | ICD-10-CM | POA: Diagnosis present

## 2020-10-29 DIAGNOSIS — M5136 Other intervertebral disc degeneration, lumbar region: Secondary | ICD-10-CM | POA: Diagnosis not present
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
## 2020-10-30 ENCOUNTER — Other Ambulatory Visit (HOSPITAL_COMMUNITY): Payer: Self-pay

## 2020-10-30 DIAGNOSIS — M5136 Other intervertebral disc degeneration, lumbar region: Secondary | ICD-10-CM | POA: Diagnosis not present

## 2020-10-30 MED FILL — Tafamidis Cap 61 MG: ORAL | 30 days supply | Qty: 30 | Fill #5 | Status: AC

## 2020-10-31 ENCOUNTER — Other Ambulatory Visit: Payer: Self-pay

## 2020-10-31 ENCOUNTER — Other Ambulatory Visit: Payer: Self-pay | Admitting: *Deleted

## 2020-10-31 DIAGNOSIS — G8929 Other chronic pain: Secondary | ICD-10-CM | POA: Diagnosis not present

## 2020-10-31 DIAGNOSIS — M5136 Other intervertebral disc degeneration, lumbar region: Secondary | ICD-10-CM | POA: Diagnosis not present

## 2020-10-31 DIAGNOSIS — Z981 Arthrodesis status: Secondary | ICD-10-CM | POA: Diagnosis not present

## 2020-10-31 DIAGNOSIS — M545 Low back pain, unspecified: Secondary | ICD-10-CM | POA: Diagnosis not present

## 2020-10-31 NOTE — Patient Outreach (Signed)
Medicaid Managed Care   Nurse Care Manager Note  10/31/2020 Name:  Russell Collier MRN:  [id number] DOB:  Mar 08, 1960  Russell Collier is an 60 y.o. year old male who is a primary patient of Russell Shave, MD.  The [facility] Managed Care Coordination team was consulted for assistance with:    Post lumbar fusion  [name] was given information about Medicaid Managed Care Coordination team services today. Russell Collier Patient agreed to services and verbal consent obtained.  Engaged with patient by telephone for follow up visit in response to provider referral for case management and/or care coordination services.   Assessments/Interventions:  Review of past medical history, allergies, medications, health status, including review of consultants reports, laboratory and other test data, was performed as part of comprehensive evaluation and provision of chronic care management services.  SDOH (Social Determinants of Health) assessments and interventions performed:   Care Plan  Allergies  Allergen Reactions   Varenicline Other (See Comments)    Patient reports laryngospasm stopped in the ER   Bupropion Other (See Comments)    Headache - moderate/severe - self discontinued agent     Medications Reviewed Today    Unable to review individual medications today. Patient was not at home.     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) 04/16/2019   Esophageal reflux 02/06/2019   Heartburn 02/06/2019   Chronic skin ulcer of lower leg ([place]) 12/21/2018   S/P drug eluting coronary stent placement    Coronary artery disease involving native coronary artery of native heart with unstable angina pectoris ([place]) 12/20/2018   Unstable angina (HCC)    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   Acute on chronic diastolic heart failure (HCC)    Shortness of breath    Precordial chest pain    Idiopathic peripheral neuropathy    Palpitations    Chronic diastolic heart failure (HCC)    Abnormal ankle brachial index (ABI)    Chest pain, rule out acute myocardial infarction 09/26/2018   History of cocaine abuse ([place]) 08/20/2018   Marijuana use 08/20/2018   Morbid obesity with BMI of 40.0-44.9, adult ([place]) 08/20/2018   Dyspepsia    Morbid obesity (HCC)    OSA on CPAP    Chest pain 05/30/2018   CAD (coronary artery disease) 03/30/2018   Elevated troponin 02/03/2018   Positive urine drug screen 02/03/2018   Tobacco abuse 02/03/2018   Hyperlipidemia 02/03/2018   Acute respiratory failure with hypoxia ([place]) 02/02/2018   Acute congestive heart failure (HCC)    Keratoconjunctivitis sicca of left eye not specified as Sjogren's 09/06/2017   Long term current use of oral  hypoglycemic drug 09/06/2017   Mechanical ectropion of left lower eyelid 09/06/2017   Nuclear sclerotic cataract of both eyes 09/06/2017   Refractive amblyopia, left 09/06/2017   Type 2 diabetes mellitus without complication, without long-term current use of insulin ([place]) 09/06/2017   Hyperopia of both eyes with astigmatism and presbyopia 09/06/2017   Atypical chest pain  12/27/2015   Essential hypertension    Neuropathy     Conditions to be addressed/monitored per PCP order:   post lumbar fusion, DMII  Care Plan : General Plan of Care (Adult)  Updates made by [name], RN since 10/31/2020 12:00 AM     Problem: Health Promotion or Disease Self-Management (General Plan of Care)      Long-Range Goal: Self-Management Plan Developed   Start Date: 07/11/2020  Expected End Date: 11/27/2020  Recent Progress: On track  Priority: Medium  Note:   Current Barriers:  Ineffective Self Health Maintenance-Russell Collier is managing multiple health issues. He recently had back surgery and is having trouble sleeping due to needing a bed. He is managing right knee pain, in which he is waiting on the scheduling of an MRI. He would like to start PT and work on weight loss. He is managing diabetes with diet alone, recent A1C 6.0. He is wearing compression socks and having a hard time putting them on. Russell Collier is working on smoking cessation and has cut back to 1/2 pack a day. Russell Collier has started PT for degenerative joint disease. He is waiting on [facility] agency to have adequate staffing for PCS. He is utilizing medical transportation provided by [facility]. Patient reports smoking a little more, now 1/2-1 pack a day. He would like to quit, but feels like he has made an improvement in his health by cutting out narcotics. Russell Collier receives Paramedicine assistance and is very thankful for this help. Russell Collier is now receiving PCS 7 days a week, which is very helpful. He is very thankful for the help and support of everyone. He would like to work on smoking cessation and is requesting RNCM reach out to [place] for assistance. He "graduated" from PT and will start aquatic therapy 8/17. Continues to try to lose weight, reports a weight loss of 55# over the last year.-Update-Russell Collier is at the Orthopedic office today for follow up. He plans to discuss back PT today. His medications are bubble  packed, but is having difficulty affording all of his medications. He has completed the Paramedicine program. Lacks social connections Unable to perform ADLs independently Does not contact provider office for questions/concerns Currently UNABLE TO independently self manage needs related to chronic health conditions.  Knowledge Deficits related to short term plan for care coordination needs and long term plans for chronic disease management needs Nurse Case Manager Clinical Goal(s):  patient will work with care management team to address care coordination and chronic disease management needs related to knee pain and recent back surgery   Interventions:  Evaluation of current treatment plan related to knee pain and patient's adherence to plan as established by provider. Provided education to patient re: healthy diet Discussed plans with patient for ongoing care management follow up and provided patient with direct contact information for care management team Collaborate with [name], BSW for programs for medication assistance Discussed plans to request PT for back strengthening with Orthopedic Self Care Activities:  Patient will self administer medications as prescribed Patient will attend all scheduled provider appointments Patient will call pharmacy for medication refills Patient  will call provider office for new concerns or questions Patient Goals: - Call [phone]) or go online [email or web address] for [name] and [name] vouchers offered by [employer] - schedule recommended health tests (blood work, mammogram, colonoscopy, pap test) - attend all scheduled appointments - work on maintaining a diabetic diet, compliant with gout restrictions - work on smoking cessation, call 1-800-QUIT-NOW or contact Susan Moore [phone] for assistance - work with Pharmacist, Janeann Forehand for assistance with smoking  cessation Follow Up Plan: Telephone follow up appointment with care management team member scheduled for:11/27/20 @ 2:30pm      Follow Up:  Patient agrees to Care Plan and Follow-up.  Plan: The Managed Medicaid care management team will reach out to the patient again over the next 30 days.  Date/time of next scheduled RN care management/care coordination outreach:  11/27/20 @ 2:30pm  [name] RN, [place] RN Care Coordinator

## 2020-10-31 NOTE — Patient Instructions (Signed)
Visit Information  Russell Collier was given information about Medicaid Managed Care team care coordination services as a part of their [place] Medicaid benefit. Russell Collier verbally consented to engagement with the [facility] Managed Care team.   If you are experiencing a medical emergency, please call 911 or report to your local emergency department or urgent care.   If you have a non-emergency medical problem during routine business hours, please contact your provider's office and ask to speak with a nurse.   For questions related to your [facility], please call: [phone] or visit the homepage here: [email or web address]  If you would like to schedule transportation through your [facility], please call the following number at least 2 days in advance of your appointment: [phone].   Call the [place] at [phone], at any time, 24 hours a day, 7 days a week. If you are in danger or need immediate medical attention call 911.  If you would like help to quit smoking, call 1-800-QUIT-NOW 605-754-1707) OR Espaol: 1-855-Djelo-Ya QO:409462) o para ms informacin haga clic aqu or Text READY to 200-400 to register via text  Russell Collier - following are the goals we discussed in your visit today:   Goals Addressed             This Visit's Progress    Protect My Health       Timeframe:  Long-Range Goal Priority:  Medium Start Date:    07/11/20                         Expected End Date:   11/27/20                  Follow Up Date 11/27/20   - Call ([phone]) or go online [email or web address] for [name] and [name] vouchers offered by [employer] - schedule recommended health tests (blood work, mammogram, colonoscopy, pap test) - attend all scheduled  appointments - work on maintaining a diabetic diet, compliant with gout restrictions - work on smoking cessation, call 1-800-QUIT-NOW or contact [place] [phone] for assistance - work with Pharmacist, Janeann Forehand for assistance with smoking cessation    Why is this important?   Screening tests can find diseases early when they are easier to treat.  Your doctor or nurse will talk with you about which tests are important for you.  Getting shots for common diseases like the flu and shingles will help prevent them.             Please see education materials related to health maintenance provided as print materials.   The patient verbalized understanding of instructions provided today and agreed to receive a mailed copy of patient instruction and/or educational materials.  Telephone follow up appointment with Managed Medicaid care management team member scheduled for:11/27/20 @ 2:30pm  [name] RN, BSN [place] RN Care Coordinator   Following is a copy of your plan of care:  Patient Care Plan: Medication Management     Problem Identified: Health Promotion or Disease Self-Management (General Plan of Care)      Goal: Medication Management   Note:   Current Barriers:  Unable to independently monitor therapeutic efficacy Unable to self administer medications as prescribed   Pharmacist Clinical Goal(s):  Over the next 30 days, patient will achieve adherence to monitoring guidelines and medication adherence to achieve therapeutic efficacy through  collaboration with PharmD and provider.    Interventions: Inter-disciplinary care team collaboration (see longitudinal plan of care) Comprehensive medication review performed; medication list updated in electronic medical record  Health Maintenance  Patient Goals/Self-Care Activities Over the next 30 days, patient will:  - take medications as prescribed collaborate with provider on medication  access solutions  Follow Up Plan: The care management team will reach out to the patient again over the next 30 days.       Patient Care Plan: General Plan of Care (Adult)     Problem Identified: Health Promotion or Disease Self-Management (General Plan of Care)      Long-Range Goal: Self-Management Plan Developed   Start Date: 07/11/2020  Expected End Date: 11/27/2020  Recent Progress: On track  Priority: Medium  Note:   Current Barriers:  Ineffective Self Health Maintenance-Russell Collier is managing multiple health issues. He recently had back surgery and is having trouble sleeping due to needing a bed. He is managing right knee pain, in which he is waiting on the scheduling of an MRI. He would like to start PT and work on weight loss. He is managing diabetes with diet alone, recent A1C 6.0. He is wearing compression socks and having a hard time putting them on. Russell Collier is working on smoking cessation and has cut back to 1/2 pack a day. Russell Collier has started PT for degenerative joint disease. He is waiting on [facility] agency to have adequate staffing for PCS. He is utilizing medical transportation provided by [facility] Patient reports smoking a little more, now 1/2-1 pack a day. He would like to quit, but feels like he has made an improvement in his health by cutting out narcotics. Russell Collier receives Paramedicine assistance and is very thankful for this help. Russell Collier is now receiving PCS 7 days a week, which is very helpful. He is very thankful for the help and support of everyone. He would like to work on smoking cessation and is requesting RNCM reach out to [place] for assistance. He "graduated" from PT and will start aquatic therapy 8/17. Continues to try to lose weight, reports a weight loss of 55# over the last year.-Update-Russell Collier is at the Orthopedic office today for follow up. He plans to discuss back PT today. His medications are bubble packed, but is having difficulty affording all of his  medications. He has completed the Paramedicine program. Lacks social connections Unable to perform ADLs independently Does not contact provider office for questions/concerns Currently UNABLE TO independently self manage needs related to chronic health conditions.  Knowledge Deficits related to short term plan for care coordination needs and long term plans for chronic disease management needs Nurse Case Manager Clinical Goal(s):  patient will work with care management team to address care coordination and chronic disease management needs related to knee pain and recent back surgery   Interventions:  Evaluation of current treatment plan related to knee pain and patient's adherence to plan as established by provider. Provided education to patient re: healthy diet Discussed plans with patient for ongoing care management follow up and provided patient with direct contact information for care management team Collaborate with [name], BSW for programs for medication assistance Discussed plans to request PT for back strengthening with Orthopedic Self Care Activities:  Patient will self administer medications as prescribed Patient will attend all scheduled provider appointments Patient will call pharmacy for medication refills Patient will call provider office for new concerns or questions Patient Goals: - Call ([phone]) or  go online [email or web address] for [name] and [name] vouchers offered by [employer] - schedule recommended health tests (blood work, mammogram, colonoscopy, pap test) - attend all scheduled appointments - work on maintaining a diabetic diet, compliant with gout restrictions - work on smoking cessation, call 1-800-QUIT-NOW or contact [place] [phone] for assistance - work with Pharmacist, Janeann Forehand for assistance with smoking cessation Follow Up Plan: Telephone follow up appointment with care  management team member scheduled for:11/27/20 @ 2:30pm

## 2020-11-01 DIAGNOSIS — M5136 Other intervertebral disc degeneration, lumbar region: Secondary | ICD-10-CM | POA: Diagnosis not present

## 2020-11-02 DIAGNOSIS — M5136 Other intervertebral disc degeneration, lumbar region: Secondary | ICD-10-CM | POA: Diagnosis not present

## 2020-11-03 DIAGNOSIS — M5136 Other intervertebral disc degeneration, lumbar region: Secondary | ICD-10-CM | POA: Diagnosis not present

## 2020-11-04 ENCOUNTER — Other Ambulatory Visit (HOSPITAL_COMMUNITY): Payer: Self-pay

## 2020-11-04 DIAGNOSIS — M5136 Other intervertebral disc degeneration, lumbar region: Secondary | ICD-10-CM | POA: Diagnosis not present

## 2020-11-05 ENCOUNTER — Telehealth: Payer: Self-pay | Admitting: Pharmacist

## 2020-11-05 ENCOUNTER — Other Ambulatory Visit: Payer: Self-pay

## 2020-11-05 ENCOUNTER — Ambulatory Visit: Payer: Medicaid Other

## 2020-11-05 DIAGNOSIS — G8929 Other chronic pain: Secondary | ICD-10-CM

## 2020-11-05 DIAGNOSIS — M545 Low back pain, unspecified: Secondary | ICD-10-CM

## 2020-11-05 DIAGNOSIS — G629 Polyneuropathy, unspecified: Secondary | ICD-10-CM

## 2020-11-05 DIAGNOSIS — M79661 Pain in right lower leg: Secondary | ICD-10-CM | POA: Diagnosis not present

## 2020-11-05 DIAGNOSIS — M5136 Other intervertebral disc degeneration, lumbar region: Secondary | ICD-10-CM | POA: Diagnosis not present

## 2020-11-05 DIAGNOSIS — R258 Other abnormal involuntary movements: Secondary | ICD-10-CM

## 2020-11-05 DIAGNOSIS — M6281 Muscle weakness (generalized): Secondary | ICD-10-CM

## 2020-11-05 NOTE — Telephone Encounter (Signed)
-----   Message from Leavy Cella, [place] sent at 10/17/2020  1:55 PM EDT ----- Regarding: Tobacco Cessation - Intake reduction

## 2020-11-05 NOTE — Telephone Encounter (Signed)
Patient contacted for follow/up of tobacco intake reduction attempt.   Since last contact patient reports no significant change in smoking (Estimated to be ~ 1/2 ppd)  He continues to struggle with back and knee pain but states his back pain ranges from 0-7 out of 10 each day.    Medications currently being used; NONE  BUT  Shared that he was contemplating restarting nicotine patches.  He stated that he had an entire box as his supply.   Rates IMPORTANCE of quitting tobacco on 1-10 scale of 10. Rates CONFIDENCE of quitting tobacco on 1-10 scale of 7.  Most common triggers to use tobacco include; stress, back and leg pain, boredom   Motivation to quit: breathing   Total time with patient call and documentation of interaction: 13 minutes.  F/U Phone call planned: 3 weeks.
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
## 2020-11-06 DIAGNOSIS — M5136 Other intervertebral disc degeneration, lumbar region: Secondary | ICD-10-CM | POA: Diagnosis not present

## 2020-11-07 DIAGNOSIS — M5136 Other intervertebral disc degeneration, lumbar region: Secondary | ICD-10-CM | POA: Diagnosis not present

## 2020-11-08 DIAGNOSIS — M5136 Other intervertebral disc degeneration, lumbar region: Secondary | ICD-10-CM | POA: Diagnosis not present

## 2020-11-09 DIAGNOSIS — M5136 Other intervertebral disc degeneration, lumbar region: Secondary | ICD-10-CM | POA: Diagnosis not present

## 2020-11-10 DIAGNOSIS — M5136 Other intervertebral disc degeneration, lumbar region: Secondary | ICD-10-CM | POA: Diagnosis not present

## 2020-11-11 DIAGNOSIS — M5136 Other intervertebral disc degeneration, lumbar region: Secondary | ICD-10-CM | POA: Diagnosis not present

## 2020-11-12 DIAGNOSIS — M5136 Other intervertebral disc degeneration, lumbar region: Secondary | ICD-10-CM | POA: Diagnosis not present

## 2020-11-13 DIAGNOSIS — M5136 Other intervertebral disc degeneration, lumbar region: Secondary | ICD-10-CM | POA: Diagnosis not present

## 2020-11-14 ENCOUNTER — Other Ambulatory Visit: Payer: Self-pay

## 2020-11-14 DIAGNOSIS — I5030 Unspecified diastolic (congestive) heart failure: Secondary | ICD-10-CM

## 2020-11-14 DIAGNOSIS — M5136 Other intervertebral disc degeneration, lumbar region: Secondary | ICD-10-CM | POA: Diagnosis not present

## 2020-11-14 DIAGNOSIS — I10 Essential (primary) hypertension: Secondary | ICD-10-CM

## 2020-11-14 DIAGNOSIS — G4733 Obstructive sleep apnea (adult) (pediatric): Secondary | ICD-10-CM

## 2020-11-14 DIAGNOSIS — I5032 Chronic diastolic (congestive) heart failure: Secondary | ICD-10-CM

## 2020-11-15 DIAGNOSIS — M5136 Other intervertebral disc degeneration, lumbar region: Secondary | ICD-10-CM | POA: Diagnosis not present

## 2020-11-16 DIAGNOSIS — M5136 Other intervertebral disc degeneration, lumbar region: Secondary | ICD-10-CM | POA: Diagnosis not present

## 2020-11-17 ENCOUNTER — Other Ambulatory Visit: Payer: Self-pay

## 2020-11-17 ENCOUNTER — Ambulatory Visit: Payer: Medicaid Other

## 2020-11-17 DIAGNOSIS — M79661 Pain in right lower leg: Secondary | ICD-10-CM | POA: Diagnosis not present

## 2020-11-17 DIAGNOSIS — M5136 Other intervertebral disc degeneration, lumbar region: Secondary | ICD-10-CM | POA: Diagnosis not present

## 2020-11-17 DIAGNOSIS — M6281 Muscle weakness (generalized): Secondary | ICD-10-CM

## 2020-11-17 DIAGNOSIS — G8929 Other chronic pain: Secondary | ICD-10-CM

## 2020-11-17 DIAGNOSIS — R262 Difficulty in walking, not elsewhere classified: Secondary | ICD-10-CM

## 2020-11-17 DIAGNOSIS — M545 Low back pain, unspecified: Secondary | ICD-10-CM

## 2020-11-17 NOTE — Patient Instructions (Signed)
Visit Information  Russell Collier was given information about Medicaid Managed Care team care coordination services as a part of their [place] Medicaid benefit. Russell Collier verbally consented to engagement with the [facility] Managed Care team.   If you are experiencing a medical emergency, please call 911 or report to your local emergency department or urgent care.   If you have a non-emergency medical problem during routine business hours, please contact your provider's office and ask to speak with a nurse.   For questions related to your [facility], please call: [phone] or visit the homepage here: [email or web address]  If you would like to schedule transportation through your [facility], please call the following number at least 2 days in advance of your appointment: [phone].   Call the [place] at [phone], at any time, 24 hours a day, 7 days a week. If you are in danger or need immediate medical attention call 911.  If you would like help to quit smoking, call 1-800-QUIT-NOW 971-041-2770) OR Espaol: 1-855-Djelo-Ya (2-706-237-6283) o para ms informacin haga clic aqu or Text READY to 200-400 to register via text  [name] - following are the goals we discussed in your visit today:   Goals Addressed   None     The patient verbalized understanding of instructions provided today and declined a print copy of patient instruction materials.   RN Care Manager will 11/27/20 Pharmacist will 12/09/20  Hughes Better PharmD, CPP High Risk Managed Medicaid [place] [phone]   Following is a copy of your plan of care:  Patient Care Plan: General Pharmacy (Adult)     Problem Identified: Chronic disease state management Resolved 11/17/2020  Priority: High  Onset Date: 11/14/2020     Long-Range Goal: Manage  chronic disease therapy Completed 11/17/2020  Start Date: 11/14/2020  Expected End Date: 02/12/2021  This Visit's Progress: On track  Priority: High  Note:   Current Barriers:  Unable to independently afford treatment regimen since having medications switched to pill packaging.    Pharmacist Clinical Goal(s):  patient will verbalize ability to afford treatment regimen maintain control of Type 2 diabetes as evidenced by home blood glucose readings  achieve improvement in smoking cessation as evidenced by decrease in daily cigarette use adhere to prescribed medication regimen as evidenced by appropriately refilling medications  through collaboration with PharmD and provider.   Interventions: Inter-disciplinary care team collaboration (see longitudinal plan of care) Comprehensive medication review performed; medication list updated in electronic medical record  Diabetes:  Controlled; current treatment: farxiga 10mg    Current glucose readings: fasting glucose: 90's-low 100's  Denies hypoglycemic/hyperglycemic symptoms  Current meal patterns:  Breakfast: veggie rich crackers + coffee Snacks: fruit, Milkshake 3x/week Patient reports working towards decreasing eating out   Current exercise: physical therapy   Educated on dietary choices for recommended in T2DM Recommended patient continue to check blood glucose daily    Tobacco Abuse:  1/2 packs per day; 54 years of use; Patient reports the nicotine patches cause him skin irritation  Triggers to smoke: stress, back and leg pain, boredom   Recommended patient continue to work with Janeann Forehand, PharmD on tobacco cessation.  Patient Goals/Self-Care Activities Over the next 90 days, patient will:  - take medications as prescribed check glucose daily, document, and provide at future appointments collaborate with provider on medication access solutions Continue to work with Janeann Forehand, PharmD to work towards smoking cessation.  Follow  Up Plan: Telephone follow up appointment with care management team member scheduled for: 11/27/20 with RN Case Manager; 12/09/20 with PharmD    Problem Identified: Chronic Disease Management   Priority: High     Long-Range Goal: Managing chronic diseases   Start Date: 11/14/2020  Expected End Date: 02/15/2021  This Visit's Progress: On track  Priority: High  Note:   urrent Barriers:  Unable to independently afford treatment regimen since having medications switched to pill packaging.    Pharmacist Clinical Goal(s):  patient will verbalize ability to afford treatment regimen maintain control of Type 2 diabetes as evidenced by home blood glucose readings  achieve improvement in smoking cessation as evidenced by decrease in daily cigarette use adhere to prescribed medication regimen as evidenced by appropriately refilling medications  through collaboration with PharmD and provider.   Interventions: Inter-disciplinary care team collaboration (see longitudinal plan of care) Comprehensive medication review performed; medication list updated in electronic medical record  Diabetes:  Controlled; current treatment: farxiga 10mg    Current glucose readings: fasting glucose: 90's-low 100's  Denies hypoglycemic/hyperglycemic symptoms  Current meal patterns:  Breakfast: veggie rich crackers + coffee Snacks: fruit, Milkshake 3x/week Patient reports working towards decreasing eating out   Current exercise: physical therapy   Educated on dietary choices for recommended in T2DM Recommended patient continue to check blood glucose daily    Tobacco Abuse:  1/2 packs per day; 54 years of use; Patient reports the nicotine patches cause him skin irritation  Triggers to smoke: stress, back and leg pain, boredom   Recommended patient continue to work with Janeann Forehand, PharmD on tobacco cessation.  Patient Goals/Self-Care Activities Over the next 90 days, patient will:  - take medications as  prescribed check glucose daily, document, and provide at future appointments collaborate with provider on medication access solutions Continue to work with Janeann Forehand, PharmD to work towards smoking cessation.  Follow Up Plan: Telephone follow up appointment with care management team member scheduled for: 11/27/20 with RN Case Manager; 12/09/20 with PharmD

## 2020-11-17 NOTE — Patient Outreach (Signed)
Medicaid Managed Care Pharmacy Note  11/14/2020 Name:  Russell Collier MRN:  [id number] DOB:  1961-01-02  Russell Collier is an 60 y.o. year old male who is a primary patient of [name], MD.  The [facility] Managed Care Coordination team was consulted for assistance with disease management and care coordination needs.    Engaged with patient by telephone for follow up visit in response to referral for case management and/or care coordination services.  Russell Collier was given information about Medicaid Managed Care Coordination team services today. Russell Collier Patient agreed to services and verbal consent obtained.  Objective:  Lab Results  Component Value Date   CREATININE 1.45 (H) 09/22/2020   CREATININE CANCELED 09/16/2020   CREATININE 1.75 (H) 03/25/2020    Lab Results  Component Value Date   HGBA1C 6.2 09/16/2020       Component Value Date/Time   CHOL 119 09/16/2020 1421   TRIG 104 09/16/2020 1421   HDL 33 (L) 09/16/2020 1421   CHOLHDL 3.6 09/16/2020 1421   CHOLHDL 3.3 07/28/2019 0633   VLDL 15 07/28/2019 0633   LDLCALC 66 09/16/2020 1421   BP Readings from Last 3 Encounters:  09/22/20 102/60  09/16/20 112/72  09/01/20 128/70    Assessment/Interventions: Review of patient past medical history, allergies, medications, health status, including review of consultants reports, laboratory and other test data, was performed as part of comprehensive evaluation and provision of chronic care management services.   SDOH:  (Social Determinants of Health) assessments and interventions performed:  SDOH Interventions    Flowsheet Row Most Recent Value  SDOH Interventions   Financial Strain Interventions Other (Comment)  [Will help patient with medications]       Care Plan  Allergies  Allergen Reactions   Varenicline Other (See Comments)    Patient reports laryngospasm stopped in the ER   Bupropion Other (See Comments)    Headache - moderate/severe - self  discontinued agent     Medications Reviewed Today     Reviewed by Cherie Ouch, PT (Physical Therapist) on 11/17/20 at 21  Med List Status: <None>   Medication Order Taking? Sig Documenting Provider Last Dose Status Informant  Accu-Chek Softclix Lancets lancets [id number] Yes Use as instructed [name], MD Taking Active   albuterol (PROVENTIL) (2.5 MG/3ML) 0.083% nebulizer solution [id number] Yes Take 2.5 mg by nebulization every 6 (six) hours as needed for wheezing or shortness of breath. [provider] Taking Active Multiple Informants  albuterol (VENTOLIN HFA) 108 (90 Base) MCG/ACT inhaler [id number] Yes Inhale 2 puffs into the lungs every 6 (six) hours as needed for wheezing or shortness of breath. [name], MD Taking Active            Med Note Georgina Peer, Amunique Neyra   Fri Nov 14, 2020 11:09 AM) 2x/week  apixaban (ELIQUIS) 2.5 MG TABS tablet [id number] Yes Take 1 tablet (2.5 mg total) by mouth 2 (two) times daily. [name], MD Taking Active            Med Note Georgina Peer, Brynlyn Dade   Fri Nov 14, 2020 11:09 AM)    atorvastatin (LIPITOR) 80 MG tablet [id number] Yes Take 1 tablet (80 mg total) by mouth daily. [name], MD Taking Active   baclofen (LIORESAL) 10 MG tablet [id number] Yes Take 0.5-1 tablets (5-10 mg total) by mouth 3 (three) times daily as needed. [name], MD Taking Active   Blood Glucose Monitoring Suppl (ACCU-CHEK GUIDE) w/Device KIT [id number] Yes 1 Units by Does not apply  route daily. [name], MD Taking Active   dapagliflozin propanediol (FARXIGA) 10 MG TABS tablet [id number] Yes Take 1 tablet (10 mg total) by mouth daily before breakfast. [name], MD Taking Active   dexamethasone (DECADRON) injection 2 mg [id number]   [name], DPM  Active   enalapril (VASOTEC) 20 MG tablet [id number] Yes Take 1 tablet (20 mg total) by mouth at bedtime. [name], MD Taking Active   fluticasone [facility]) 50 MCG/ACT  nasal spray [id number] Yes Place 1 spray into both nostrils daily. [name], MD Taking Active   furosemide (LASIX) 40 MG tablet [id number] Yes Take 1 tablet (40 mg total) by mouth 2 (two) times daily. [name], MD Taking Active   glucose blood (ACCU-CHEK GUIDE) test strip [id number] Yes Use as instructed [name], MD Taking Active   isosorbide mononitrate (IMDUR) 30 MG 24 hr tablet [id number] Yes Take 1 tablet (30 mg total) by mouth daily. [name], MD Taking Active   Multiple Vitamin (MULTIVITAMIN WITH MINERALS) TABS tablet [id number] Yes Take 1 tablet by mouth daily. [provider] Taking Active   pantoprazole (PROTONIX) 40 MG tablet [id number] Yes Take 1 tablet (40 mg total) by mouth daily. [name], MD Taking Active   potassium chloride SA (KLOR-CON) 20 MEQ tablet [id number] Yes TAKE 1 TABLET(20 MEQ) BY MOUTH DAILY [name], MD Taking Active   pregabalin (LYRICA) 100 MG capsule [id number] Yes TAKE ONE CAPSULE BY MOUTH THREE TIMES DAILY McDiarmid, Blane Ohara, MD Taking Active   Tafamidis 61 MG CAPS [id number] Yes TAKE 1 CAPSULE BY MOUTH DAILY. [name], [name], MD Taking Active   umeclidinium-vilanterol [facility] ELLIPTA) 62.5-25 MCG/INH AEPB [id number] Yes Inhale 1 puff into the lungs daily as needed. [name], MD Taking Active            Med Note Georgina Peer, Jamirra Curnow   Fri Nov 14, 2020 11:15 AM) Dewaine Conger prn  valACYclovir (VALTREX) 500 MG tablet [id number] Yes Take 1 tablet (500 mg total) by mouth daily. 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   COPD exacerbation ([place]) 03/20/2020   Type 2 diabetes mellitus with diabetic peripheral angiopathy without gangrene, without long-term current use of insulin ([place]) 03/20/2020   Coagulation defect ([place]) 12/11/2019   Fatigue 12/10/2019   Leg cramps 12/10/2019    Mild epistaxis 10/30/2019   Disc degeneration, 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   Ganglion of right knee 09/27/2019   Lumbar radiculopathy 09/27/2019   Lower extremity edema    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[place])    Acute respiratory failure ([place]) 07/27/2019   Primary osteoarthritis of right knee 07/24/2019   Breast tenderness in male 05/31/2019   Chronic pain 05/31/2019   AKI (acute kidney injury) ([place]) 05/31/2019   Pain due to onychomycosis of toenails of both feet 05/16/2019   Chronic knee pain 04/16/2019   Diabetes ([place]) 04/16/2019   Esophageal reflux 02/06/2019   Heartburn 02/06/2019   Chronic skin ulcer of lower leg ([place]) 12/21/2018   S/P drug eluting coronary stent placement    Coronary artery disease involving native coronary artery of native heart with unstable angina pectoris ([place]) 12/20/2018   Unstable angina (HCC)    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   Acute on chronic diastolic heart failure (HCC)    Shortness of breath    Precordial chest pain  Idiopathic peripheral neuropathy    Palpitations    Chronic diastolic heart failure (HCC)    Abnormal ankle brachial index (ABI)    Chest pain, rule out acute myocardial infarction 09/26/2018   History of cocaine abuse ([place]) 08/20/2018   Marijuana use 08/20/2018   Morbid obesity with BMI of 40.0-44.9, adult ([place]) 08/20/2018   Dyspepsia    Morbid obesity ([place])    OSA on CPAP    Chest pain 05/30/2018   CAD (coronary artery disease) 03/30/2018   Elevated troponin 02/03/2018   Positive urine drug screen 02/03/2018   Tobacco abuse 02/03/2018   Hyperlipidemia 02/03/2018   Acute respiratory failure with hypoxia ([place]) 02/02/2018   Acute congestive heart failure (HCC)    Keratoconjunctivitis sicca of left eye not specified as Sjogren's 09/06/2017   Long term current use of oral hypoglycemic drug 09/06/2017   Mechanical ectropion of left lower eyelid 09/06/2017   Nuclear sclerotic cataract of both eyes 09/06/2017   Refractive amblyopia, left  09/06/2017   Type 2 diabetes mellitus without complication, without long-term current use of insulin ([place]) 09/06/2017   Hyperopia of both eyes with astigmatism and presbyopia 09/06/2017   Atypical chest pain 12/27/2015   Essential hypertension    Neuropathy     Conditions to be addressed/monitored per PCP order:  HTN and DMII    Care Plan : General Pharmacy (Adult)  Updates made by Hughes Better, RPH-CPP since 11/17/2020 12:00 AM     Problem: Chronic disease state management Resolved 11/17/2020  Priority: High  Onset Date: 11/14/2020     Long-Range Goal: Manage chronic disease therapy Completed 11/17/2020  Start Date: 11/14/2020  Expected End Date: 02/12/2021  This Visit's Progress: On track  Priority: High  Note:   Current Barriers:  Unable to independently afford treatment regimen since having medications switched to pill packaging.    Pharmacist Clinical Goal(s):  patient will verbalize ability to afford treatment regimen maintain control of Type 2 diabetes as evidenced by home blood glucose readings  achieve improvement in smoking cessation as evidenced by decrease in daily cigarette use adhere to prescribed medication regimen as evidenced by appropriately refilling medications  through collaboration with PharmD and provider.   Interventions: Inter-disciplinary care team collaboration (see longitudinal plan of care) Comprehensive medication review performed; medication list updated in electronic medical record  Diabetes:  Controlled; current treatment: farxiga 63m   Current glucose readings: fasting glucose: 90's-low 100's  Denies hypoglycemic/hyperglycemic symptoms  Current meal patterns:  Breakfast: veggie rich crackers + coffee Snacks: fruit, Milkshake 3x/week Patient reports working towards decreasing eating out   Current exercise: physical therapy   Educated on dietary choices for recommended in T2DM Recommended patient continue to check blood glucose daily     Tobacco Abuse:  1/2 packs per day; 54 years of use; Patient reports the nicotine patches cause him skin irritation  Triggers to smoke: stress, back and leg pain, boredom   Recommended patient continue to work with PJaneann Forehand PharmD on tobacco cessation.  Patient Goals/Self-Care Activities Over the next 90 days, patient will:  - take medications as prescribed check glucose daily, document, and provide at future appointments collaborate with provider on medication access solutions Continue to work with PJaneann Forehand PharmD to work towards smoking cessation.  Follow Up Plan: Telephone follow up appointment with care management team member scheduled for: 11/27/20 with RN Case Manager; 12/09/20 with PharmD     RHughes BetterPharmD, CPP High Risk [facility]aged [facility] [phone]
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
## 2020-11-17 NOTE — Patient Instructions (Signed)
  [id number]

## 2020-11-18 DIAGNOSIS — M5136 Other intervertebral disc degeneration, lumbar region: Secondary | ICD-10-CM | POA: Diagnosis not present

## 2020-11-19 DIAGNOSIS — M5136 Other intervertebral disc degeneration, lumbar region: Secondary | ICD-10-CM | POA: Diagnosis not present

## 2020-11-20 DIAGNOSIS — M5136 Other intervertebral disc degeneration, lumbar region: Secondary | ICD-10-CM | POA: Diagnosis not present

## 2020-11-20 MED ORDER — VALACYCLOVIR HCL 500 MG PO TABS: 500.0000 mg | ORAL_TABLET | Freq: Every day | ORAL | 3 refills | Status: DC

## 2020-11-20 MED ORDER — ISOSORBIDE MONONITRATE ER 30 MG PO TB24: 30.0000 mg | ORAL_TABLET | Freq: Every day | ORAL | 3 refills | Status: DC

## 2020-11-20 MED ORDER — POTASSIUM CHLORIDE CRYS ER 20 MEQ PO TBCR: EXTENDED_RELEASE_TABLET | ORAL | 3 refills | Status: DC

## 2020-11-20 MED ORDER — ACCU-CHEK GUIDE VI STRP: ORAL_STRIP | 12 refills | Status: DC

## 2020-11-20 MED ORDER — ACCU-CHEK GUIDE W/DEVICE KIT: 1.0000 [IU] | PACK | Freq: Every day | 0 refills | Status: DC

## 2020-11-20 MED ORDER — PREGABALIN 100 MG PO CAPS: ORAL_CAPSULE | ORAL | 2 refills | Status: DC

## 2020-11-20 MED ORDER — ATORVASTATIN CALCIUM 80 MG PO TABS: 80.0000 mg | ORAL_TABLET | Freq: Every day | ORAL | 3 refills | Status: DC

## 2020-11-20 MED ORDER — DAPAGLIFLOZIN PROPANEDIOL 10 MG PO TABS: 10.0000 mg | ORAL_TABLET | Freq: Every day | ORAL | 6 refills | Status: DC

## 2020-11-20 MED ORDER — FLUTICASONE PROPIONATE 50 MCG/ACT NA SUSP: 1.0000 | Freq: Every day | NASAL | 11 refills | Status: DC

## 2020-11-20 MED ORDER — PANTOPRAZOLE SODIUM 40 MG PO TBEC: 40.0000 mg | DELAYED_RELEASE_TABLET | Freq: Every day | ORAL | 0 refills | Status: DC

## 2020-11-20 MED ORDER — BACLOFEN 10 MG PO TABS: 5.0000 mg | ORAL_TABLET | Freq: Three times a day (TID) | ORAL | 0 refills | Status: DC | PRN

## 2020-11-20 MED ORDER — ALBUTEROL SULFATE HFA 108 (90 BASE) MCG/ACT IN AERS: 2.0000 | INHALATION_SPRAY | Freq: Four times a day (QID) | RESPIRATORY_TRACT | 3 refills | Status: DC | PRN

## 2020-11-20 MED ORDER — FUROSEMIDE 40 MG PO TABS: 40.0000 mg | ORAL_TABLET | Freq: Two times a day (BID) | ORAL | 3 refills | Status: DC

## 2020-11-20 MED ORDER — APIXABAN 2.5 MG PO TABS: 2.5000 mg | ORAL_TABLET | Freq: Two times a day (BID) | ORAL | 11 refills | Status: DC

## 2020-11-20 MED ORDER — TAFAMIDIS 61 MG PO CAPS: 1.0000 | ORAL_CAPSULE | Freq: Every day | ORAL | 11 refills | Status: DC

## 2020-11-20 MED ORDER — ACCU-CHEK SOFTCLIX LANCETS MISC: 12 refills | Status: DC

## 2020-11-20 MED ORDER — ANORO ELLIPTA 62.5-25 MCG/INH IN AEPB: 1.0000 | INHALATION_SPRAY | Freq: Every day | RESPIRATORY_TRACT | 3 refills | Status: DC | PRN

## 2020-11-20 MED ORDER — ALBUTEROL SULFATE (2.5 MG/3ML) 0.083% IN NEBU: 2.5000 mg | INHALATION_SOLUTION | Freq: Four times a day (QID) | RESPIRATORY_TRACT | 3 refills | Status: AC | PRN

## 2020-11-20 MED ORDER — ENALAPRIL MALEATE 20 MG PO TABS: 20.0000 mg | ORAL_TABLET | Freq: Every day | ORAL | 3 refills | Status: DC

## 2020-11-20 NOTE — Addendum Note (Signed)
Addended by: Concepcion Living on: 11/20/2020 10:07 AM   Modules accepted: Orders

## 2020-11-20 NOTE — Progress Notes (Signed)
Refills of all patient's medications sent to [facility] pharmacy so they can begin filling his medications rather than upstream.

## 2020-11-21 DIAGNOSIS — M5136 Other intervertebral disc degeneration, lumbar region: Secondary | ICD-10-CM | POA: Diagnosis not present

## 2020-11-22 DIAGNOSIS — M5136 Other intervertebral disc degeneration, lumbar region: Secondary | ICD-10-CM | POA: Diagnosis not present

## 2020-11-23 DIAGNOSIS — M5136 Other intervertebral disc degeneration, lumbar region: Secondary | ICD-10-CM | POA: Diagnosis not present

## 2020-11-24 ENCOUNTER — Ambulatory Visit: Payer: Medicaid Other

## 2020-11-24 DIAGNOSIS — M5136 Other intervertebral disc degeneration, lumbar region: Secondary | ICD-10-CM | POA: Diagnosis not present

## 2020-11-25 ENCOUNTER — Other Ambulatory Visit: Payer: Self-pay | Admitting: Family Medicine

## 2020-11-25 DIAGNOSIS — M5136 Other intervertebral disc degeneration, lumbar region: Secondary | ICD-10-CM | POA: Diagnosis not present

## 2020-11-26 ENCOUNTER — Other Ambulatory Visit: Payer: Self-pay

## 2020-11-26 ENCOUNTER — Ambulatory Visit: Payer: Medicaid Other | Attending: Orthopaedic Surgery

## 2020-11-26 DIAGNOSIS — M545 Low back pain, unspecified: Secondary | ICD-10-CM | POA: Insufficient documentation

## 2020-11-26 DIAGNOSIS — M6281 Muscle weakness (generalized): Secondary | ICD-10-CM | POA: Insufficient documentation

## 2020-11-26 DIAGNOSIS — M5136 Other intervertebral disc degeneration, lumbar region: Secondary | ICD-10-CM | POA: Diagnosis not present

## 2020-11-26 DIAGNOSIS — R262 Difficulty in walking, not elsewhere classified: Secondary | ICD-10-CM | POA: Insufficient documentation

## 2020-11-26 DIAGNOSIS — G8929 Other chronic pain: Secondary | ICD-10-CM | POA: Diagnosis present

## 2020-11-26 NOTE — Patient Instructions (Signed)
  [id number]
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
## 2020-11-27 ENCOUNTER — Other Ambulatory Visit: Payer: Self-pay | Admitting: *Deleted

## 2020-11-27 DIAGNOSIS — M5136 Other intervertebral disc degeneration, lumbar region: Secondary | ICD-10-CM | POA: Diagnosis not present

## 2020-11-27 NOTE — Patient Outreach (Signed)
Medicaid Managed Care   Nurse Care Manager Note  11/27/2020 Name:  Russell Collier MRN:  [id number] DOB:  06-14-1960  Russell Collier is an 60 y.o. year old male who is a primary patient of [name], MD.  The [facility] Managed Care Coordination team was consulted for assistance with:    Pain DMII  Mr. Rueter was given information about Medicaid Managed Care Coordination team services today. Russell Collier Patient agreed to services and verbal consent obtained.  Engaged with patient by telephone for follow up visit in response to provider referral for case management and/or care coordination services.   Assessments/Interventions:  Review of past medical history, allergies, medications, health status, including review of consultants reports, laboratory and other test data, was performed as part of comprehensive evaluation and provision of chronic care management services.  SDOH (Social Determinants of Health) assessments and interventions performed: SDOH Interventions    Flowsheet Row Most Recent Value  SDOH Interventions   Transportation Interventions Other (Comment)  [Patient aware of medical transportation provided by [facility] and uses this service. Explained that [place] transportation will transport to [place] when [facility] transportation [place]  Allergies  Allergen Reactions   Varenicline Other (See Comments)    Patient reports laryngospasm stopped in the ER   Bupropion Other (See Comments)    Headache - moderate/severe - self discontinued agent     Medications Reviewed Today     Reviewed by [name], RN (Registered Nurse) on 11/27/20 at 1449  Med List Status: <None>   Medication Order Taking? Sig Documenting Provider Last Dose Status Informant  Accu-Chek Softclix Lancets lancets [id number] Yes Use as instructed [name], MD Taking Active   albuterol (PROVENTIL) (2.5 MG/3ML) 0.083% nebulizer solution [id number] Yes Take 3 mLs (2.5 mg  total) by nebulization every 6 (six) hours as needed for wheezing or shortness of breath. [name], MD Taking Active   albuterol (VENTOLIN HFA) 108 (90 Base) MCG/ACT inhaler [id number] Yes Inhale 2 puffs into the lungs every 6 (six) hours as needed for wheezing or shortness of breath. [name], MD Taking Active   apixaban [facility]) 2.5 MG TABS tablet [id number] Yes Take 1 tablet (2.5 mg total) by mouth 2 (two) times daily. [name], MD Taking Active   atorvastatin (LIPITOR) 80 MG tablet [id number] Yes Take 1 tablet (80 mg total) by mouth daily. [name], MD Taking Active   baclofen (LIORESAL) 10 MG tablet [id number] Yes Take 0.5-1 tablets (5-10 mg total) by mouth 3 (three) times daily as needed. [name], MD Taking Active   Blood Glucose Monitoring Suppl (ACCU-CHEK GUIDE) w/Device KIT [id number] Yes 1 Units by Does not apply route daily. [name], MD Taking Active   dapagliflozin propanediol (FARXIGA) 10 MG TABS tablet [id number] Yes Take 1 tablet (10 mg total) by mouth daily before breakfast. [name], MD Taking Active   dexamethasone (DECADRON) injection 2 mg [id number]   Russell Collier, DPM  Active   enalapril (VASOTEC) 20 MG tablet [id number] Yes Take 1 tablet (20 mg total) by mouth at bedtime. [name], MD Taking Active   fluticasone [facility]) 50 MCG/ACT nasal spray [id number] Yes Place 1 spray into both nostrils daily. [name], MD Taking Active   furosemide (LASIX) 40 MG tablet [id number] Yes Take 1 tablet (40 mg total) by mouth 2 (two) times daily. [name], MD Taking Active   glucose blood (ACCU-CHEK GUIDE) test strip [id number] Yes Use as instructed Cresenzo,  Russell Sartorius, MD Taking Active   isosorbide mononitrate (IMDUR) 30 MG 24 hr tablet [id number] Yes Take 1 tablet (30 mg total) by mouth daily. [name], MD Taking Active   Multiple Vitamin (MULTIVITAMIN WITH MINERALS) TABS tablet [id number] Yes Take 1 tablet by mouth  daily. [provider] Taking Active   pantoprazole (PROTONIX) 40 MG tablet [id number] Yes TAKE ONE TABLET BY MOUTH BEFORE Russell Hose, MD Taking Active   potassium chloride SA (KLOR-CON) 20 MEQ tablet [id number] Yes TAKE 1 TABLET(20 MEQ) BY MOUTH DAILY [name], MD Taking Active   pregabalin (LYRICA) 100 MG capsule [id number] Yes TAKE ONE CAPSULE BY MOUTH THREE TIMES DAILY [name], MD Taking Active   Tafamidis 61 MG CAPS [id number] Yes TAKE 1 CAPSULE BY MOUTH DAILY. [name], MD Taking Active   umeclidinium-vilanterol [facility] ELLIPTA) 62.5-25 MCG/INH AEPB [id number] Yes Inhale 1 puff into the lungs daily as needed. [name], MD Taking Active   valACYclovir (VALTREX) 500 MG tablet [id number] Yes Take 1 tablet (500 mg total) by mouth daily. 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   COPD exacerbation ([place]) 03/20/2020   Type 2 diabetes mellitus with diabetic peripheral angiopathy without gangrene, without long-term current use of insulin ([place]) 03/20/2020   Coagulation defect ([place]) 12/11/2019   Fatigue 12/10/2019   Leg cramps 12/10/2019   Mild epistaxis 10/30/2019   Disc degeneration, 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   Ganglion of right knee 09/27/2019   Lumbar radiculopathy 09/27/2019   Lower extremity edema    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[place])    Acute respiratory failure ([place]) 07/27/2019   Primary osteoarthritis of right knee 07/24/2019   Breast tenderness in male 05/31/2019   Chronic pain 05/31/2019   AKI (acute kidney injury) ([place]) 05/31/2019   Pain due to onychomycosis of toenails of both feet 05/16/2019   Chronic knee pain 04/16/2019   Diabetes ([place]) 04/16/2019   Esophageal reflux 02/06/2019   Heartburn 02/06/2019   Chronic skin ulcer of lower leg ([place]) 12/21/2018   S/P drug eluting coronary stent  placement    Coronary artery disease involving native coronary artery of native heart with unstable angina pectoris ([place]) 12/20/2018   Unstable angina (HCC)    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   Acute on chronic diastolic heart failure (HCC)    Shortness of breath    Precordial chest pain    Idiopathic peripheral neuropathy    Palpitations    Chronic diastolic heart failure (HCC)    Abnormal ankle brachial index (ABI)    Chest pain, rule out acute myocardial infarction 09/26/2018   History of cocaine abuse ([place]) 08/20/2018   Marijuana use 08/20/2018   Morbid obesity with BMI of 40.0-44.9, adult ([place]) 08/20/2018   Dyspepsia    Morbid obesity (HCC)    OSA on CPAP    Chest pain 05/30/2018   CAD (coronary artery disease) 03/30/2018   Elevated troponin 02/03/2018   Positive urine drug screen 02/03/2018   Tobacco abuse 02/03/2018   Hyperlipidemia 02/03/2018   Acute respiratory failure with hypoxia ([place]) 02/02/2018   Acute congestive heart failure (HCC)    Keratoconjunctivitis sicca of left eye not specified as Sjogren's 09/06/2017   Long term current use of oral hypoglycemic drug 09/06/2017   Mechanical ectropion of left lower eyelid 09/06/2017   Nuclear sclerotic cataract of both eyes  09/06/2017   Refractive amblyopia, left 09/06/2017   Type 2 diabetes mellitus without complication, without long-term current use of insulin ([place]) 09/06/2017   Hyperopia of both eyes with astigmatism and presbyopia 09/06/2017   Atypical chest pain 12/27/2015   Essential hypertension    Neuropathy     Conditions to be addressed/monitored per PCP order:  DMII and pain  Care Plan : General Plan of Care (Adult)  Updates made by [name], RN since 11/27/2020 12:00 AM     Problem: Health Promotion or Disease Self-Management (General Plan of Care)      Long-Range Goal: Self-Management Plan Developed   Start Date: 07/11/2020  Expected End Date: 12/30/2020  Recent Progress: On track  Priority: Medium   Note:   Current Barriers:  Ineffective Self Health Maintenance-Mr. Rockett is managing multiple health issues. He recently had back surgery and is having trouble sleeping due to needing a bed. He is managing right knee pain, in which he is waiting on the scheduling of an MRI. He would like to start PT and work on weight loss. He is managing diabetes with diet alone, recent A1C 6.0. He is wearing compression socks and having a hard time putting them on. Mr. Haberle is working on smoking cessation and has cut back to 1/2 pack a day. Mr. Maxson has started PT for degenerative joint disease. He is waiting on [facility] agency to have adequate staffing for PCS. He is utilizing medical transportation provided by [facility]. Patient reports smoking a little more, now 1/2-1 pack a day. He would like to quit, but feels like he has made an improvement in his health by cutting out narcotics. Mr. Nickson receives Paramedicine assistance and is very thankful for this help. Mr. Leaf is now receiving PCS 7 days a week, which is very helpful. He is very thankful for the help and support of everyone. He would like to work on smoking cessation and is requesting RNCM reach out to [place] for assistance. He "graduated" from PT and will start aquatic therapy 8/17. Continues to try to lose weight, reports a weight loss of 55# over the last year. Mr. Barrell is at the Orthopedic office today for follow up. He plans to discuss back PT today. His medications are bubble packed, but is having difficulty affording all of his medications. He has completed the Paramedicine program.-Update- Mr. Sherrin is in PT for his back, however, he had to cancel appointment on 10/10 due to medical transportation being unavailable. His medications are now coming from [employer] and he is appreciative.  Lacks social connections Unable to perform ADLs independently Does not contact provider office for questions/concerns Currently UNABLE TO independently self manage  needs related to chronic health conditions.  Knowledge Deficits related to short term plan for care coordination needs and long term plans for chronic disease management needs Nurse Case Manager Clinical Goal(s):  patient will work with care management team to address care coordination and chronic disease management needs related to knee pain and recent back surgery   Interventions:  Evaluation of current treatment plan related to knee pain and patient's adherence to plan as established by provider. Provided education to patient re: back pain and smoking cessation Discussed plans with patient for ongoing care management follow up and provided patient with direct contact information for care management team Collaborate with [name] for assistance with [facility] transportation Provided encouragement for smoking cessation Reviewed upcoming appointments Self Care Activities:  Patient will self administer medications as prescribed  Patient will attend all scheduled provider appointments Patient will call pharmacy for medication refills Patient will call provider office for new concerns or questions Patient Goals: - Call [phone]) or go online [email or web address] for [name] and [name] vouchers offered by [employer] - schedule recommended health tests (blood work, mammogram, colonoscopy, pap test) - attend all scheduled appointments - work on maintaining a diabetic diet, compliant with gout restrictions - work on smoking cessation, call 1-800-QUIT-NOW or contact [place] [phone] for assistance - work with Pharmacist, Janeann Forehand for assistance with smoking cessation Follow Up Plan: Telephone follow up appointment with care management team member scheduled for:12/30/20 @ 2:30pm      Follow Up:  Patient agrees to Care Plan and Follow-up.  Plan: The Managed Medicaid care management team will reach out to the  patient again over the next 30 days.  Date/time of next scheduled RN care management/care coordination outreach:  12/30/20 @ 2:30pm  [name] RN, BSN [place] RN Care Coordinator

## 2020-11-27 NOTE — Patient Instructions (Signed)
Visit Information  Russell Collier was given information about Medicaid Managed Care team care coordination services as a part of their [place] Medicaid benefit. Russell Collier verbally consented to engagement with the [facility] Managed Care team.   If you are experiencing a medical emergency, please call 911 or report to your local emergency department or urgent care.   If you have a non-emergency medical problem during routine business hours, please contact your provider's office and ask to speak with a nurse.   For questions related to your [facility], please call: [phone] or visit the homepage here: [email or web address]  If you would like to schedule transportation through your [facility], please call the following number at least 2 days in advance of your appointment: [phone].   Call the [place] at [phone], at any time, 24 hours a day, 7 days a week. If you are in danger or need immediate medical attention call 911.  If you would like help to quit smoking, call 1-800-QUIT-NOW 571-551-1720) OR Espaol: 1-855-Djelo-Ya (1-607-371-0626) o para ms informacin haga clic aqu or Text READY to 200-400 to register via text  [name] - following are the goals we discussed in your visit today:   Goals Addressed             This Visit's Progress    Protect My Health       Timeframe:  Long-Range Goal Priority:  Medium Start Date:    07/11/20                         Expected End Date:   12/30/20                  Follow Up Date 12/30/20    - Follow up with PCP with any health related concerns or questions - Call ([phone]) or go online [email or web address] for [name] and [name] vouchers offered by [employer] - schedule recommended health tests  (blood work, mammogram, colonoscopy, pap test) - attend all scheduled appointments - work on maintaining a diabetic diet, compliant with gout restrictions - work on smoking cessation, call 1-800-QUIT-NOW or contact [place] [phone] for assistance - work with Pharmacist, Janeann Forehand for assistance with smoking cessation    Why is this important?   Screening tests can find diseases early when they are easier to treat.  Your doctor or nurse will talk with you about which tests are important for you.  Getting shots for common diseases like the flu and shingles will help prevent them.             Please see education materials related to smoking cessation and back pain provided as print materials.   The patient verbalized understanding of instructions provided today and agreed to receive a mailed copy of patient instruction and/or educational materials.  Telephone follow up appointment with Managed Medicaid care management team member scheduled for:12/30/20 @ 2:30pm  [name] RN, BSN [place] RN Care Coordinator   Following is a copy of your plan of care:  Patient Care Plan: Medication Management  Completed 11/17/2020   Problem Identified: Health Promotion or Disease Self-Management (General Plan of Care) Resolved 11/17/2020     Goal: Medication Management Completed 11/17/2020  Note:   Current Barriers:  Unable to independently monitor therapeutic efficacy Unable to self administer medications as prescribed   Pharmacist Clinical Goal(s):  Over  the next 30 days, patient will achieve adherence to monitoring guidelines and medication adherence to achieve therapeutic efficacy through collaboration with PharmD and provider.    Interventions: Inter-disciplinary care team collaboration (see longitudinal plan of care) Comprehensive medication review performed; medication list updated in electronic medical record  [email or web address] @ Health  Maintenance  Patient Goals/Self-Care Activities Over the next 30 days, patient will:  - take medications as prescribed collaborate with provider on medication access solutions  Follow Up Plan: The care management team will reach out to the patient again over the next 30 days.       Patient Care Plan: General Plan of Care (Adult)     Problem Identified: Health Promotion or Disease Self-Management (General Plan of Care)      Long-Range Goal: Self-Management Plan Developed   Start Date: 07/11/2020  Expected End Date: 12/30/2020  Recent Progress: On track  Priority: Medium  Note:   Current Barriers:  Ineffective Self Health Maintenance-Russell Collier is managing multiple health issues. He recently had back surgery and is having trouble sleeping due to needing a bed. He is managing right knee pain, in which he is waiting on the scheduling of an MRI. He would like to start PT and work on weight loss. He is managing diabetes with diet alone, recent A1C 6.0. He is wearing compression socks and having a hard time putting them on. Russell Collier is working on smoking cessation and has cut back to 1/2 pack a day. Russell Collier has started PT for degenerative joint disease. He is waiting on [facility] agency to have adequate staffing for PCS. He is utilizing medical transportation provided by [facility]. Patient reports smoking a little more, now 1/2-1 pack a day. He would like to quit, but feels like he has made an improvement in his health by cutting out narcotics. Russell Collier receives Paramedicine assistance and is very thankful for this help. Russell Collier is now receiving PCS 7 days a week, which is very helpful. He is very thankful for the help and support of everyone. He would like to work on smoking cessation and is requesting RNCM reach out to [place] for assistance. He "graduated" from PT and will start aquatic therapy 8/17. Continues to try to lose weight, reports a weight loss of 55# over the last year. Russell Collier is at the  Orthopedic office today for follow up. He plans to discuss back PT today. His medications are bubble packed, but is having difficulty affording all of his medications. He has completed the Paramedicine program.-Update- Russell Collier is in PT for his back, however, he had to cancel appointment on 10/10 due to medical transportation being unavailable. His medications are now coming from [employer] and he is appreciative.  Lacks social connections Unable to perform ADLs independently Does not contact provider office for questions/concerns Currently UNABLE TO independently self manage needs related to chronic health conditions.  Knowledge Deficits related to short term plan for care coordination needs and long term plans for chronic disease management needs Nurse Case Manager Clinical Goal(s):  patient will work with care management team to address care coordination and chronic disease management needs related to knee pain and recent back surgery   Interventions:  Evaluation of current treatment plan related to knee pain and patient's adherence to plan as established by provider. Provided education to patient re: back pain and smoking cessation Discussed plans with patient for ongoing care management follow up and provided patient with direct contact information for care management team Collaborate  with [name] for assistance with [facility] transportation Provided encouragement for smoking cessation Reviewed upcoming appointments Self Care Activities:  Patient will self administer medications as prescribed Patient will attend all scheduled provider appointments Patient will call pharmacy for medication refills Patient will call provider office for new concerns or questions Patient Goals: - Call ([phone]) or go online [email or web address] for [name] and [name] vouchers offered by [employer] - schedule  recommended health tests (blood work, mammogram, colonoscopy, pap test) - attend all scheduled appointments - work on maintaining a diabetic diet, compliant with gout restrictions - work on smoking cessation, call 1-800-QUIT-NOW or contact [place] [phone] for assistance - work with Pharmacist, Janeann Forehand for assistance with smoking cessation Follow Up Plan: Telephone follow up appointment with care management team member scheduled for:12/30/20 @ 2:30pm     Patient Care Plan: General Pharmacy (Adult)     Problem Identified: Chronic disease state management Resolved 11/17/2020  Priority: High  Onset Date: 11/14/2020     Long-Range Goal: Manage chronic disease therapy Completed 11/17/2020  Start Date: 11/14/2020  Expected End Date: 02/12/2021  This Visit's Progress: On track  Priority: High  Note:   Current Barriers:  Unable to independently afford treatment regimen since having medications switched to pill packaging.    Pharmacist Clinical Goal(s):  patient will verbalize ability to afford treatment regimen maintain control of Type 2 diabetes as evidenced by home blood glucose readings  achieve improvement in smoking cessation as evidenced by decrease in daily cigarette use adhere to prescribed medication regimen as evidenced by appropriately refilling medications  through collaboration with PharmD and provider.   Interventions: Inter-disciplinary care team collaboration (see longitudinal plan of care) Comprehensive medication review performed; medication list updated in electronic medical record  Diabetes:  Controlled; current treatment: farxiga 10mg    Current glucose readings: fasting glucose: 90's-low 100's  Denies hypoglycemic/hyperglycemic symptoms  Current meal patterns:  Breakfast: veggie rich crackers + coffee Snacks: fruit, Milkshake 3x/week Patient reports working towards decreasing eating out   Current exercise: physical therapy   Educated on dietary choices for  recommended in T2DM Recommended patient continue to check blood glucose daily    Tobacco Abuse:  1/2 packs per day; 54 years of use; Patient reports the nicotine patches cause him skin irritation  Triggers to smoke: stress, back and leg pain, boredom   Recommended patient continue to work with Janeann Forehand, PharmD on tobacco cessation.  Patient Goals/Self-Care Activities Over the next 90 days, patient will:  - take medications as prescribed check glucose daily, document, and provide at future appointments collaborate with provider on medication access solutions Continue to work with Janeann Forehand, PharmD to work towards smoking cessation.  Follow Up Plan: Telephone follow up appointment with care management team member scheduled for: 11/27/20 with RN Case Manager; 12/09/20 with PharmD    Problem Identified: Chronic Disease Management   Priority: High     Long-Range Goal: Managing chronic diseases   Start Date: 11/14/2020  Expected End Date: 02/15/2021  This Visit's Progress: On track  Priority: High  Note:   urrent Barriers:  Unable to independently afford treatment regimen since having medications switched to pill packaging.    Pharmacist Clinical Goal(s):  patient will verbalize ability to afford treatment regimen maintain control of Type 2 diabetes as evidenced by home blood glucose readings  achieve improvement in smoking cessation as evidenced by decrease in daily cigarette use adhere to prescribed medication regimen as evidenced by appropriately refilling medications  through collaboration  with PharmD and provider.   Interventions: Inter-disciplinary care team collaboration (see longitudinal plan of care) Comprehensive medication review performed; medication list updated in electronic medical record  Diabetes:  Controlled; current treatment: farxiga 10mg    Current glucose readings: fasting glucose: 90's-low 100's  Denies hypoglycemic/hyperglycemic symptoms  Current  meal patterns:  Breakfast: veggie rich crackers + coffee Snacks: fruit, Milkshake 3x/week Patient reports working towards decreasing eating out   Current exercise: physical therapy   Educated on dietary choices for recommended in T2DM Recommended patient continue to check blood glucose daily    Tobacco Abuse:  1/2 packs per day; 54 years of use; Patient reports the nicotine patches cause him skin irritation  Triggers to smoke: stress, back and leg pain, boredom   Recommended patient continue to work with Janeann Forehand, PharmD on tobacco cessation.  Patient Goals/Self-Care Activities Over the next 90 days, patient will:  - take medications as prescribed check glucose daily, document, and provide at future appointments collaborate with provider on medication access solutions Continue to work with Janeann Forehand, PharmD to work towards smoking cessation.  Follow Up Plan: Telephone follow up appointment with care management team member scheduled for: 11/27/20 with RN Case Manager; 12/09/20 with PharmD

## 2020-11-28 ENCOUNTER — Telehealth: Payer: Self-pay | Admitting: Pharmacist

## 2020-11-28 DIAGNOSIS — M5136 Other intervertebral disc degeneration, lumbar region: Secondary | ICD-10-CM | POA: Diagnosis not present

## 2020-11-28 NOTE — Telephone Encounter (Signed)
Patient contacted for follow/up of tobacco intake reduction / tobacco cessation attempt.   Since last contact patient reports he has "been Chain Smoking.  He also reports interest in restarting nicotine patches.    Medications currently being used;  Nicotine patch - RE-estarting 21 mg patch today.    Patient denies any significant side effects from tobacco cessation therapy.    Rates CONFIDENCE of quitting tobacco on 1-10 scale of 6 for quitting only 1 day.     Total time with patient call and documentation of interaction: 14 minutes.  F/U Phone call planned: 2 weeks

## 2020-11-28 NOTE — Telephone Encounter (Signed)
-----   Message from Leavy Cella, [place] sent at 11/05/2020  1:19 PM EDT ----- Regarding: Tobacco Reduction - Using nicotine patch?

## 2020-11-29 DIAGNOSIS — M5136 Other intervertebral disc degeneration, lumbar region: Secondary | ICD-10-CM | POA: Diagnosis not present

## 2020-12-01 ENCOUNTER — Other Ambulatory Visit (HOSPITAL_COMMUNITY): Payer: Self-pay

## 2020-12-01 ENCOUNTER — Other Ambulatory Visit (HOSPITAL_COMMUNITY): Payer: Self-pay | Admitting: Internal Medicine

## 2020-12-01 ENCOUNTER — Ambulatory Visit: Payer: Medicaid Other

## 2020-12-01 DIAGNOSIS — M5136 Other intervertebral disc degeneration, lumbar region: Secondary | ICD-10-CM | POA: Diagnosis not present

## 2020-12-01 MED ORDER — VYNDAMAX 61 MG PO CAPS
1.0000 | ORAL_CAPSULE | Freq: Every day | ORAL | 11 refills | Status: DC
  Filled 2020-12-08: qty 30, 30d supply, fill #0
  Filled 2021-01-01: qty 30, 30d supply, fill #1
  Filled 2021-02-02: qty 30, 30d supply, fill #2
  Filled 2021-03-05: qty 30, 30d supply, fill #3
  Filled 2021-04-06 – 2021-04-09 (×2): qty 30, 30d supply, fill #4
  Filled 2021-05-04: qty 30, 30d supply, fill #5
  Filled 2021-06-01: qty 30, 30d supply, fill #6
  Filled 2021-06-30: qty 30, 30d supply, fill #7
  Filled 2021-07-27: qty 30, 30d supply, fill #8
  Filled 2021-08-27: qty 30, 30d supply, fill #9
  Filled 2021-09-23: qty 30, 30d supply, fill #10
  Filled 2021-10-21: qty 30, 30d supply, fill #11

## 2020-12-02 ENCOUNTER — Other Ambulatory Visit: Payer: Self-pay

## 2020-12-02 DIAGNOSIS — M5136 Other intervertebral disc degeneration, lumbar region: Secondary | ICD-10-CM | POA: Diagnosis not present

## 2020-12-02 NOTE — Patient Instructions (Signed)
Visit Information  [name] was given information about Medicaid Managed Care team care coordination services as a part of their [place] Medicaid benefit. Russell Collier verbally consented to engagement with the [facility] Managed Care team.   If you are experiencing a medical emergency, please call 911 or report to your local emergency department or urgent care.   If you have a non-emergency medical problem during routine business hours, please contact your provider's office and ask to speak with a nurse.   For questions related to your [facility], please call: [phone] or visit the homepage here: [email or web address]  If you would like to schedule transportation through your [facility], please call the following number at least 2 days in advance of your appointment: [phone].   Call the [place] at [phone], at any time, 24 hours a day, 7 days a week. If you are in danger or need immediate medical attention call 911.  If you would like help to quit smoking, call 1-800-QUIT-NOW 4190365571) OR Espaol: 1-855-Djelo-Ya (5-750-518-3358) o para ms informacin haga clic aqu or Text READY to 200-400 to register via text  [name] - following are the goals we discussed in your visit today:   Goals Addressed   None      Social Worker will follow up in 30 days.   Russell Collier, BSW, [place] Managed Medicaid Team  [phone]   Following is a copy of your plan of care:

## 2020-12-02 NOTE — Patient Outreach (Signed)
Medicaid Managed Care Social Work Note  12/02/2020 Name:  Russell Collier MRN:  [id number] DOB:  [birth year]  Russell Collier is an 60 y.o. year old male who is a primary patient of [name], MD.  The Medicaid Managed Care Coordination team was consulted for assistance with:  Transportation Needs   [name] was given information about Medicaid Managed Care Coordination team services today. Russell Collier Patient agreed to services and verbal consent obtained.  Engaged with patient  for by telephone forfollow up visit in response to referral for case management and/or care coordination services.   Assessments/Interventions:  Review of past medical history, allergies, medications, health status, including review of consultants reports, laboratory and other test data, was performed as part of comprehensive evaluation and provision of chronic care management services.  SDOH: (Social Determinant of Health) assessments and interventions performed: BSW contacted patient about transportation. Patient stated his insurance stated they were not able to schedule anything until 10/13, but a rep was able to get him set up for 10/12. Patient stated he got it all smoothed out, and not other resources/services are needed at this time.   Advanced Directives Status:  Not addressed in this encounter.  Care Plan                 Allergies  Allergen Reactions   Varenicline Other (See Comments)    Patient reports laryngospasm stopped in the ER   Bupropion Other (See Comments)    Headache - moderate/severe - self discontinued agent     Medications Reviewed Today     Reviewed by Leavy Cella, RPH-CPP (Pharmacist) on 11/28/20 at 1127  Med List Status: <None>   Medication Order Taking? Sig Documenting Provider Last Dose Status Informant  Accu-Chek Softclix Lancets lancets [id number] No Use as instructed [name], MD Taking Active   albuterol (PROVENTIL) (2.5 MG/3ML) 0.083% nebulizer solution  [id number] No Take 3 mLs (2.5 mg total) by nebulization every 6 (six) hours as needed for wheezing or shortness of breath. [name], MD Taking Active   albuterol (VENTOLIN HFA) 108 (90 Base) MCG/ACT inhaler [id number] No Inhale 2 puffs into the lungs every 6 (six) hours as needed for wheezing or shortness of breath. [name], MD Taking Active   apixaban (ELIQUIS) 2.5 MG TABS tablet [id number] No Take 1 tablet (2.5 mg total) by mouth 2 (two) times daily. [name], MD Taking Active   atorvastatin (LIPITOR) 80 MG tablet [id number] No Take 1 tablet (80 mg total) by mouth daily. [name], MD Taking Active   baclofen (LIORESAL) 10 MG tablet [id number] No Take 0.5-1 tablets (5-10 mg total) by mouth 3 (three) times daily as needed. [name], MD Taking Active   Blood Glucose Monitoring Suppl (ACCU-CHEK GUIDE) w/Device KIT [id number] No 1 Units by Does not apply route daily. [name], MD Taking Active   dapagliflozin propanediol (FARXIGA) 10 MG TABS tablet [id number] No Take 1 tablet (10 mg total) by mouth daily before breakfast. [name], MD Taking Active   dexamethasone (DECADRON) injection 2 mg [id number]   [name], DPM  Active   enalapril (VASOTEC) 20 MG tablet [id number] No Take 1 tablet (20 mg total) by mouth at bedtime. [name], MD Taking Active   fluticasone [facility]) 50 MCG/ACT nasal spray [id number] No Place 1 spray into both nostrils daily. [name], MD Taking Active   furosemide (LASIX) 40 MG tablet [id number] No Take 1 tablet (40 mg total) by mouth 2 (two) times daily. Cresenzo,  Christy Sartorius, MD Taking Active   glucose blood (ACCU-CHEK GUIDE) test strip [id number] No Use as instructed [name], MD Taking Active   isosorbide mononitrate (IMDUR) 30 MG 24 hr tablet [id number] No Take 1 tablet (30 mg total) by mouth daily. [name], MD Taking Active   Multiple Vitamin (MULTIVITAMIN WITH MINERALS) TABS tablet [id number] No Take  1 tablet by mouth daily. [provider] Taking Active   pantoprazole (PROTONIX) 40 MG tablet [id number] No TAKE ONE TABLET BY MOUTH BEFORE [name], MD Taking Active   potassium chloride SA (KLOR-CON) 20 MEQ tablet [id number] No TAKE 1 TABLET(20 MEQ) BY MOUTH DAILY [name], MD Taking Active   pregabalin (LYRICA) 100 MG capsule [id number] No TAKE ONE CAPSULE BY MOUTH THREE TIMES DAILY [name], MD Taking Active   Tafamidis 61 MG CAPS [id number] No TAKE 1 CAPSULE BY MOUTH DAILY. [name], MD Taking Active   umeclidinium-vilanterol [facility] ELLIPTA) 62.5-25 MCG/INH AEPB [id number] No Inhale 1 puff into the lungs daily as needed. [name], MD Taking Active   valACYclovir (VALTREX) 500 MG tablet [id number] No Take 1 tablet (500 mg total) by mouth daily. 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   COPD exacerbation ([place]) 03/20/2020   Type 2 diabetes mellitus with diabetic peripheral angiopathy without gangrene, without long-term current use of insulin ([place]) 03/20/2020   Coagulation defect ([place]) 12/11/2019   Fatigue 12/10/2019   Leg cramps 12/10/2019   Mild epistaxis 10/30/2019   Disc degeneration, 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   Ganglion of right knee 09/27/2019   Lumbar radiculopathy 09/27/2019   Lower extremity edema    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[place])    Acute respiratory failure ([place]) 07/27/2019   Primary osteoarthritis of right knee 07/24/2019   Breast tenderness in male 05/31/2019   Chronic pain 05/31/2019   AKI (acute kidney injury) ([place]) 05/31/2019   Pain due to onychomycosis of toenails of both feet 05/16/2019   Chronic knee pain 04/16/2019   Diabetes ([place]) 04/16/2019   Esophageal reflux 02/06/2019   Heartburn 02/06/2019   Chronic skin ulcer of lower leg ([place]) 12/21/2018   S/P drug eluting coronary  stent placement    Coronary artery disease involving native coronary artery of native heart with unstable angina pectoris ([place]) 12/20/2018   Unstable angina (HCC)    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   Acute on chronic diastolic heart failure (HCC)    Shortness of breath    Precordial chest pain    Idiopathic peripheral neuropathy    Palpitations    Chronic diastolic heart failure (HCC)    Abnormal ankle brachial index (ABI)    Chest pain, rule out acute myocardial infarction 09/26/2018   History of cocaine abuse ([place]) 08/20/2018   Marijuana use 08/20/2018   Morbid obesity with BMI of 40.0-44.9, adult ([place]) 08/20/2018   Dyspepsia    Morbid obesity (HCC)    OSA on CPAP    Chest pain 05/30/2018   CAD (coronary artery disease) 03/30/2018   Elevated troponin 02/03/2018   Positive urine drug screen 02/03/2018   Tobacco abuse 02/03/2018   Hyperlipidemia 02/03/2018   Acute respiratory failure with hypoxia ([place]) 02/02/2018   Acute congestive heart failure (HCC)    Keratoconjunctivitis sicca of left eye not specified as Sjogren's 09/06/2017   Long term current use of oral hypoglycemic drug  09/06/2017   Mechanical ectropion of left lower eyelid 09/06/2017   Nuclear sclerotic cataract of both eyes 09/06/2017   Refractive amblyopia, left 09/06/2017   Type 2 diabetes mellitus without complication, without long-term current use of insulin ([place]) 09/06/2017   Hyperopia of both eyes with astigmatism and presbyopia 09/06/2017   Atypical chest pain 12/27/2015   Essential hypertension    Neuropathy     Conditions to be addressed/monitored per PCP order:   transportation  Care Plan : General Plan of Care (Adult)  Updates made by Ethelda Chick since 12/02/2020 12:00 AM     Problem: Health Promotion or Disease Self-Management (General Plan of Care)      Long-Range Goal: Self-Management Plan Developed   Start Date: 07/11/2020  Expected End Date: 12/30/2020  Recent Progress: On track  Priority:  Medium  Note:   Current Barriers:  Ineffective Self Health Maintenance-Mr. Graveline is managing multiple health issues. He recently had back surgery and is having trouble sleeping due to needing a bed. He is managing right knee pain, in which he is waiting on the scheduling of an MRI. He would like to start PT and work on weight loss. He is managing diabetes with diet alone, recent A1C 6.0. He is wearing compression socks and having a hard time putting them on. Mr. Levi is working on smoking cessation and has cut back to 1/2 pack a day. Mr. Cumpian has started PT for degenerative joint disease. He is waiting on [facility] agency to have adequate staffing for PCS. He is utilizing medical transportation provided by [facility]. Patient reports smoking a little more, now 1/2-1 pack a day. He would like to quit, but feels like he has made an improvement in his health by cutting out narcotics. Mr. Cuffe receives Paramedicine assistance and is very thankful for this help. Mr. Perazzo is now receiving PCS 7 days a week, which is very helpful. He is very thankful for the help and support of everyone. He would like to work on smoking cessation and is requesting RNCM reach out to [place] for assistance. He "graduated" from PT and will start aquatic therapy 8/17. Continues to try to lose weight, reports a weight loss of 55# over the last year. Mr. Erber is at the Orthopedic office today for follow up. He plans to discuss back PT today. His medications are bubble packed, but is having difficulty affording all of his medications. He has completed the Paramedicine program.-Update- Mr. Terhune is in PT for his back, however, he had to cancel appointment on 10/10 due to medical transportation being unavailable. His medications are now coming from [employer] and he is appreciative.  Lacks social connections Unable to perform ADLs independently Does not contact provider office for questions/concerns Currently UNABLE TO independently self  manage needs related to chronic health conditions.  Knowledge Deficits related to short term plan for care coordination needs and long term plans for chronic disease management needs Nurse Case Manager Clinical Goal(s):  patient will work with care management team to address care coordination and chronic disease management needs related to knee pain and recent back surgery   Interventions:  Evaluation of current treatment plan related to knee pain and patient's adherence to plan as established by provider. Provided education to patient re: back pain and smoking cessation Discussed plans with patient for ongoing care management follow up and provided patient with direct contact information for care management team Collaborate with [name] for assistance with [facility] transportation Provided encouragement for  smoking cessation Reviewed upcoming appointments BSW contacted patient about transportation. Patient stated his insurance stated they were not able to schedule anything until 10/13, but a rep was able to get him set up for 10/12. Patient stated he got it all smoothed out, and not other resources/services are needed at this time. Self Care Activities:  Patient will self administer medications as prescribed Patient will attend all scheduled provider appointments Patient will call pharmacy for medication refills Patient will call provider office for new concerns or questions Patient Goals: - Call ([phone]) or go online [email or web address] for [name] and [name] vouchers offered by [employer] - schedule recommended health tests (blood work, mammogram, colonoscopy, pap test) - attend all scheduled appointments - work on maintaining a diabetic diet, compliant with gout restrictions - work on smoking cessation, call 1-800-QUIT-NOW or contact [place] [phone] for assistance - work with Pharmacist, Janeann Forehand  for assistance with smoking cessation Follow Up Plan: Telephone follow up appointment with care management team member scheduled for:12/30/20 @ 2:30pm      Follow up:  Patient agrees to Care Plan and Follow-up.  Plan: The Managed Medicaid care management team will reach out to the patient again over the next 30 days.  Date/time of next scheduled Social Work care management/care coordination outreach11/11/22  Mickel Fuchs, [place], [place] Medicaid Team  [phone]

## 2020-12-03 DIAGNOSIS — M5136 Other intervertebral disc degeneration, lumbar region: Secondary | ICD-10-CM | POA: Diagnosis not present

## 2020-12-04 ENCOUNTER — Ambulatory Visit: Payer: Medicaid Other

## 2020-12-04 ENCOUNTER — Emergency Department (HOSPITAL_COMMUNITY): Payer: Medicaid Other

## 2020-12-04 ENCOUNTER — Encounter (HOSPITAL_COMMUNITY): Payer: Self-pay | Admitting: Emergency Medicine

## 2020-12-04 ENCOUNTER — Emergency Department (HOSPITAL_COMMUNITY)
Admission: EM | Admit: 2020-12-04 | Discharge: 2020-12-04 | Disposition: A | Discharge status: Home / Self Care | Payer: Medicaid Other | Attending: Emergency Medicine | Admitting: Emergency Medicine

## 2020-12-04 ENCOUNTER — Other Ambulatory Visit: Payer: Self-pay

## 2020-12-04 VITALS — BP 141/81 | HR 92

## 2020-12-04 DIAGNOSIS — R0981 Nasal congestion: Secondary | ICD-10-CM | POA: Diagnosis not present

## 2020-12-04 DIAGNOSIS — Z20822 Contact with and (suspected) exposure to covid-19: Secondary | ICD-10-CM | POA: Insufficient documentation

## 2020-12-04 DIAGNOSIS — I2510 Atherosclerotic heart disease of native coronary artery without angina pectoris: Secondary | ICD-10-CM | POA: Diagnosis not present

## 2020-12-04 DIAGNOSIS — R059 Cough, unspecified: Secondary | ICD-10-CM | POA: Diagnosis not present

## 2020-12-04 DIAGNOSIS — R609 Edema, unspecified: Secondary | ICD-10-CM | POA: Diagnosis not present

## 2020-12-04 DIAGNOSIS — M6281 Muscle weakness (generalized): Secondary | ICD-10-CM

## 2020-12-04 DIAGNOSIS — I110 Hypertensive heart disease with heart failure: Secondary | ICD-10-CM | POA: Diagnosis not present

## 2020-12-04 DIAGNOSIS — R262 Difficulty in walking, not elsewhere classified: Secondary | ICD-10-CM

## 2020-12-04 DIAGNOSIS — R0600 Dyspnea, unspecified: Secondary | ICD-10-CM | POA: Diagnosis not present

## 2020-12-04 DIAGNOSIS — E119 Type 2 diabetes mellitus without complications: Secondary | ICD-10-CM | POA: Diagnosis not present

## 2020-12-04 DIAGNOSIS — J449 Chronic obstructive pulmonary disease, unspecified: Secondary | ICD-10-CM | POA: Diagnosis not present

## 2020-12-04 DIAGNOSIS — R062 Wheezing: Secondary | ICD-10-CM | POA: Diagnosis not present

## 2020-12-04 DIAGNOSIS — I5033 Acute on chronic diastolic (congestive) heart failure: Secondary | ICD-10-CM | POA: Insufficient documentation

## 2020-12-04 DIAGNOSIS — Z7901 Long term (current) use of anticoagulants: Secondary | ICD-10-CM | POA: Diagnosis not present

## 2020-12-04 DIAGNOSIS — G8929 Other chronic pain: Secondary | ICD-10-CM

## 2020-12-04 DIAGNOSIS — F17210 Nicotine dependence, cigarettes, uncomplicated: Secondary | ICD-10-CM | POA: Insufficient documentation

## 2020-12-04 DIAGNOSIS — M545 Low back pain, unspecified: Secondary | ICD-10-CM

## 2020-12-04 DIAGNOSIS — M5136 Other intervertebral disc degeneration, lumbar region: Secondary | ICD-10-CM | POA: Diagnosis not present

## 2020-12-04 DIAGNOSIS — R600 Localized edema: Secondary | ICD-10-CM | POA: Insufficient documentation

## 2020-12-04 DIAGNOSIS — I509 Heart failure, unspecified: Secondary | ICD-10-CM | POA: Diagnosis not present

## 2020-12-04 LAB — CBC WITH DIFFERENTIAL/PLATELET
Abs Immature Granulocytes: 0.03 10*3/uL (ref 0.00–0.07)
Basophils Absolute: 0 10*3/uL (ref 0.0–0.1)
Basophils Relative: 0 %
Eosinophils Absolute: 0.2 10*3/uL (ref 0.0–0.5)
Eosinophils Relative: 2 %
HCT: 38.4 % — ABNORMAL LOW (ref 39.0–52.0)
Hemoglobin: 12.7 g/dL — ABNORMAL LOW (ref 13.0–17.0)
Immature Granulocytes: 0 %
Lymphocytes Relative: 23 %
Lymphs Abs: 2.3 10*3/uL (ref 0.7–4.0)
MCH: 29.7 pg (ref 26.0–34.0)
MCHC: 33.1 g/dL (ref 30.0–36.0)
MCV: 89.9 fL (ref 80.0–100.0)
Monocytes Absolute: 1 10*3/uL (ref 0.1–1.0)
Monocytes Relative: 10 %
Neutro Abs: 6.5 10*3/uL (ref 1.7–7.7)
Neutrophils Relative %: 65 %
Platelets: 232 10*3/uL (ref 150–400)
RBC: 4.27 MIL/uL (ref 4.22–5.81)
RDW: 17.4 % — ABNORMAL HIGH (ref 11.5–15.5)
WBC: 10 10*3/uL (ref 4.0–10.5)
nRBC: 0 % (ref 0.0–0.2)

## 2020-12-04 LAB — BASIC METABOLIC PANEL
Anion gap: 9 (ref 5–15)
BUN: 15 mg/dL (ref 8–23)
CO2: 23 mmol/L (ref 22–32)
Calcium: 8.5 mg/dL — ABNORMAL LOW (ref 8.9–10.3)
Chloride: 102 mmol/L (ref 98–111)
Creatinine, Ser: 1.22 mg/dL (ref 0.61–1.24)
GFR, Estimated: >60 mL/min (ref >60)
Glucose, Bld: 106 mg/dL — ABNORMAL HIGH (ref 70–99)
Potassium: 3.8 mmol/L (ref 3.5–5.1)
Sodium: 134 mmol/L — ABNORMAL LOW (ref 135–145)

## 2020-12-04 LAB — TROPONIN I (HIGH SENSITIVITY)
Troponin I (High Sensitivity): 15 ng/L (ref <18)
Troponin I (High Sensitivity): 14 ng/L (ref <18)

## 2020-12-04 LAB — RESP PANEL BY RT-PCR (FLU A&B, COVID) ARPGX2
Influenza A by PCR: NEGATIVE
Influenza B by PCR: NEGATIVE
SARS Coronavirus 2 by RT PCR: NEGATIVE

## 2020-12-04 LAB — BRAIN NATRIURETIC PEPTIDE: B Natriuretic Peptide: 28.5 pg/mL (ref 0.0–100.0)

## 2020-12-04 MED ORDER — IPRATROPIUM BROMIDE 0.02 % IN SOLN
0.5000 mg | Freq: Once | RESPIRATORY_TRACT | Status: AC
  Administered 2020-12-04: 0.5 mg via RESPIRATORY_TRACT
  Filled 2020-12-04: qty 2.5

## 2020-12-04 MED ORDER — ALBUTEROL SULFATE (2.5 MG/3ML) 0.083% IN NEBU
5.0000 mg | INHALATION_SOLUTION | Freq: Once | RESPIRATORY_TRACT | Status: AC
  Administered 2020-12-04: 5 mg via RESPIRATORY_TRACT
  Filled 2020-12-04: qty 6

## 2020-12-04 NOTE — Patient Instructions (Signed)
  [id number]

## 2020-12-04 NOTE — ED Provider Notes (Signed)
Emergency Medicine Provider Triage Evaluation Note  Russell Collier , a 60 y.o. male  was evaluated in triage.  Pt complains of increase LE swelling and SOB-- waxing and waning.  Hx CHF.  Does have a lot of nasal congestion and post-nasal drip.  States worse when trying to lay down causing him to cough a lot.  Taking medications as directed.  Home covid test negative and denies sick contacts.  Review of Systems  Positive: LE edema, SOB, congestion Negative: fever  Physical Exam  BP 115/66 (BP Location: Left Arm)   Pulse 67   Temp 98.5 F (36.9 C) (Oral)   Resp 18   SpO2 97%   Gen:   Awake, no distress   Resp:  Normal effort, wet cough, congestion MSK:   Moves extremities without difficulty  Other:  1+ BLE edema, symmetric, no erythema  Medical Decision Making  Medically screening exam initiated at 1:49 AM.  Appropriate orders placed.  Russell Collier was informed that the remainder of the evaluation will be completed by another provider, this initial triage assessment does not replace that evaluation, and the importance of remaining in the ED until their evaluation is complete.  LE edema, chest pain, cough.  Hx of CHF.  Cardiac work-up initiated, covid screen sent.   Larene Pickett, PA-C 12/04/20 0211    Lorelle Gibbs, DO 12/04/20 2342

## 2020-12-04 NOTE — Discharge Instructions (Signed)
As we discussed, you can take an additional 40 mg of Lasix today and then go back to your regular dosing. There are  no signs of pneumonia, no signs of COVID, no signs of heart failure on today's evaluation

## 2020-12-04 NOTE — ED Triage Notes (Signed)
Per EMS, pt from home is a CHF pt and c/o bilateral lower edema X5 days.  Pt Is med complaint and reports urinating as usual.

## 2020-12-04 NOTE — ED Notes (Signed)
Patient verbalizes understanding of discharge instructions. Opportunity for questioning and answers were provided. Armband removed by staff, pt discharged from ED via wheelchair.  
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
## 2020-12-04 NOTE — ED Notes (Signed)
RT at bedside to administer nebs
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
## 2020-12-05 ENCOUNTER — Telehealth: Payer: Self-pay

## 2020-12-05 DIAGNOSIS — M5136 Other intervertebral disc degeneration, lumbar region: Secondary | ICD-10-CM | POA: Diagnosis not present

## 2020-12-05 NOTE — Telephone Encounter (Signed)
Transition Care Management Follow-up Telephone Call Date of discharge and from where: 12/04/2020 from [facility] ED How have you been since you were released from the hospital? Pt states that he is feeling some better.  Any questions or concerns? No  Items Reviewed: Did the pt receive and understand the discharge instructions provided? Yes  Medications obtained and verified? Yes  Other? No  Any new allergies since your discharge? No  Dietary orders reviewed? No Do you have support at home? Yes   Functional Questionnaire: (I = Independent and D = Dependent) ADLs: I Bathing/Dressing- I Meal Prep- I Eating- I Maintaining continence- I Transferring/Ambulation- I Managing Meds- I   Follow up appointments reviewed:  PCP Hospital f/u appt confirmed? No  Patient stated that he will call PCP office for following up appt.  [facility] f/u appt confirmed? No   Are transportation arrangements needed? No  If their condition worsens, is the pt aware to call PCP or go to the Emergency Dept.? Yes Was the patient provided with contact information for the PCP's office or ED? Yes Was to pt encouraged to call back with questions or concerns? Yes

## 2020-12-06 DIAGNOSIS — M5136 Other intervertebral disc degeneration, lumbar region: Secondary | ICD-10-CM | POA: Diagnosis not present

## 2020-12-07 DIAGNOSIS — M5136 Other intervertebral disc degeneration, lumbar region: Secondary | ICD-10-CM | POA: Diagnosis not present

## 2020-12-08 ENCOUNTER — Ambulatory Visit: Payer: Medicaid Other

## 2020-12-08 ENCOUNTER — Other Ambulatory Visit: Payer: Self-pay

## 2020-12-08 ENCOUNTER — Other Ambulatory Visit (HOSPITAL_COMMUNITY): Payer: Self-pay

## 2020-12-08 DIAGNOSIS — M545 Low back pain, unspecified: Secondary | ICD-10-CM

## 2020-12-08 DIAGNOSIS — M6281 Muscle weakness (generalized): Secondary | ICD-10-CM

## 2020-12-08 DIAGNOSIS — R262 Difficulty in walking, not elsewhere classified: Secondary | ICD-10-CM

## 2020-12-08 DIAGNOSIS — G8929 Other chronic pain: Secondary | ICD-10-CM

## 2020-12-08 DIAGNOSIS — M5136 Other intervertebral disc degeneration, lumbar region: Secondary | ICD-10-CM | POA: Diagnosis not present
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
## 2020-12-09 ENCOUNTER — Other Ambulatory Visit: Payer: Self-pay

## 2020-12-09 DIAGNOSIS — M5136 Other intervertebral disc degeneration, lumbar region: Secondary | ICD-10-CM | POA: Diagnosis not present

## 2020-12-09 NOTE — Progress Notes (Signed)
    SUBJECTIVE:   CHIEF COMPLAINT / HPI:   ED follow-up: 60 year old male presenting for follow-up from ED visit on 12/04/2020.  He was seen in emergency department for concern of leg swelling.  Had a history of diastolic CHF as well as CAD and hypertension.  He had complaints of the leg edema for about 5 days prior to coming to the hospital.  He had denied any worsening of dyspnea on exertion.  In the emergency department his x-ray showed no sign of acute CHF or pneumonia.  He was recommended to take an additional Lasix dose and follow-up with outpatient.  Troponins were flat and BNP was 28.5.  Previous echo 07/27/2019 with abnormal septal motion but ejection fraction 55% and normal diastolic parameters. Today he states he is taking his lasix 40mg  in the morning and evening. His swelling has improved. He still has some shortness of breath with activity but it has not worsened and none at rest.  Erectile dysfunction: Patient requests starting Viagra.  States that of late he has had worsening sex drive and would like a medication such as this.  He states that he does currently smoke and has been trying to exercise more and more by walking further.  He states that he is able to walk across a large parking lot without any dyspnea or shortness of breath.  PERTINENT  PMH / PSH: Hx COPD  OBJECTIVE:   BP 120/70   Pulse 76   Wt (!) 312 lb (141.5 kg)   BMI 44.13 kg/m    General: NAD, pleasant, able to participate in exam Cardiac: RRR, no murmurs. Respiratory: CTAB, normal effort, No wheezes, rales or rhonchi Abdomen: Bowel sounds present, nontender Extremities: Trace pitting edema of the bilateral lower extremities Skin: warm and dry, no rashes noted Neuro: alert, no obvious focal deficits Psych: Normal affect and mood  ASSESSMENT/PLAN:   Hospital follow-up-leg swelling: 60 year old male present for hospital follow-up after worsening leg swelling without dyspnea.  He was recommended to  increase his Lasix dosing for couple days and return to his normal dosing.  Has been on the normal dosing for few days and his leg swelling has resolved.  He has no concerns of dyspnea at this time.  He had an echo about a year ago which was normal other than some abnormal septal moxibustion but had normal ejection fraction and normal diastolic function.  Erectile dysfunction: Patient request starting medication such as Viagra, unfortunately he is on isosorbide dinitrate so this is contraindicated.  Discussed with him that cutting back on smoking and continue to work on exercise such as walking further and further will help with his libido but that this is likely also secondary to his recent illness.  Lurline Del, [place]

## 2020-12-10 DIAGNOSIS — M5136 Other intervertebral disc degeneration, lumbar region: Secondary | ICD-10-CM | POA: Diagnosis not present

## 2020-12-11 ENCOUNTER — Other Ambulatory Visit: Payer: Self-pay

## 2020-12-11 ENCOUNTER — Ambulatory Visit: Payer: Medicaid Other

## 2020-12-11 DIAGNOSIS — G8929 Other chronic pain: Secondary | ICD-10-CM

## 2020-12-11 DIAGNOSIS — R262 Difficulty in walking, not elsewhere classified: Secondary | ICD-10-CM

## 2020-12-11 DIAGNOSIS — M6281 Muscle weakness (generalized): Secondary | ICD-10-CM

## 2020-12-11 DIAGNOSIS — M5136 Other intervertebral disc degeneration, lumbar region: Secondary | ICD-10-CM | POA: Diagnosis not present

## 2020-12-11 DIAGNOSIS — M545 Low back pain, unspecified: Secondary | ICD-10-CM | POA: Diagnosis not present
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
## 2020-12-12 ENCOUNTER — Ambulatory Visit (INDEPENDENT_AMBULATORY_CARE_PROVIDER_SITE_OTHER): Payer: Medicaid Other | Admitting: Family Medicine

## 2020-12-12 ENCOUNTER — Telehealth: Payer: Self-pay | Admitting: Pharmacist

## 2020-12-12 VITALS — BP 120/70 | HR 76 | Wt 312.0 lb

## 2020-12-12 DIAGNOSIS — M5136 Other intervertebral disc degeneration, lumbar region: Secondary | ICD-10-CM | POA: Diagnosis not present

## 2020-12-12 DIAGNOSIS — Z09 Encounter for follow-up examination after completed treatment for conditions other than malignant neoplasm: Secondary | ICD-10-CM | POA: Diagnosis not present

## 2020-12-12 DIAGNOSIS — M7989 Other specified soft tissue disorders: Secondary | ICD-10-CM | POA: Diagnosis not present

## 2020-12-12 NOTE — Telephone Encounter (Signed)
Patient contacted for follow/up of tobacco intake reduction.   Since last contact patient reports continued intake of ~10 cigarettes per day   Medications currently being used; NONE - stopped patch due to irritation.    Most common triggers to use tobacco include;   Patient plan to come to PCP visit with Dr. Caron Presume later today.  I may try to see later today.   Total time with patient call and documentation of interaction: 11 minutes.  F/U Phone call planned: 2 weeks.

## 2020-12-12 NOTE — Patient Instructions (Addendum)
Today we discussed your recent emergency department follow-up.  I am glad that your swelling has improved.  I do not think we need to check any labs for this today.  If you get worsening swelling, worsening shortness of breath, or any other concerns please follow back up with [place].  I think it would be fine if you want to restart over-the-counter vitamin D supplements.  As we discussed I do not think there is an indication to check a vitamin D level right now.  I do recommend that you follow-up with your primary care doctor to continue discussion.  Also recommend cutting down on smoking as I think this will help with your energy level and trying to continue to exercise more and more.

## 2020-12-13 DIAGNOSIS — H5213 Myopia, bilateral: Secondary | ICD-10-CM | POA: Diagnosis not present

## 2020-12-13 DIAGNOSIS — M5136 Other intervertebral disc degeneration, lumbar region: Secondary | ICD-10-CM | POA: Diagnosis not present

## 2020-12-14 DIAGNOSIS — M5136 Other intervertebral disc degeneration, lumbar region: Secondary | ICD-10-CM | POA: Diagnosis not present

## 2020-12-15 ENCOUNTER — Ambulatory Visit: Payer: Medicaid Other

## 2020-12-15 ENCOUNTER — Other Ambulatory Visit (HOSPITAL_COMMUNITY): Payer: Self-pay

## 2020-12-15 ENCOUNTER — Other Ambulatory Visit: Payer: Self-pay

## 2020-12-15 DIAGNOSIS — R0902 Hypoxemia: Secondary | ICD-10-CM | POA: Diagnosis not present

## 2020-12-15 DIAGNOSIS — M545 Low back pain, unspecified: Secondary | ICD-10-CM | POA: Diagnosis not present

## 2020-12-15 DIAGNOSIS — Z7952 Long term (current) use of systemic steroids: Secondary | ICD-10-CM | POA: Insufficient documentation

## 2020-12-15 DIAGNOSIS — G8929 Other chronic pain: Secondary | ICD-10-CM

## 2020-12-15 DIAGNOSIS — I110 Hypertensive heart disease with heart failure: Secondary | ICD-10-CM | POA: Diagnosis not present

## 2020-12-15 DIAGNOSIS — Z79899 Other long term (current) drug therapy: Secondary | ICD-10-CM | POA: Insufficient documentation

## 2020-12-15 DIAGNOSIS — I2510 Atherosclerotic heart disease of native coronary artery without angina pectoris: Secondary | ICD-10-CM | POA: Insufficient documentation

## 2020-12-15 DIAGNOSIS — F17210 Nicotine dependence, cigarettes, uncomplicated: Secondary | ICD-10-CM | POA: Insufficient documentation

## 2020-12-15 DIAGNOSIS — R262 Difficulty in walking, not elsewhere classified: Secondary | ICD-10-CM

## 2020-12-15 DIAGNOSIS — I5032 Chronic diastolic (congestive) heart failure: Secondary | ICD-10-CM | POA: Insufficient documentation

## 2020-12-15 DIAGNOSIS — R58 Hemorrhage, not elsewhere classified: Secondary | ICD-10-CM | POA: Diagnosis not present

## 2020-12-15 DIAGNOSIS — R040 Epistaxis: Secondary | ICD-10-CM | POA: Diagnosis not present

## 2020-12-15 DIAGNOSIS — Z7901 Long term (current) use of anticoagulants: Secondary | ICD-10-CM | POA: Diagnosis not present

## 2020-12-15 DIAGNOSIS — I10 Essential (primary) hypertension: Secondary | ICD-10-CM | POA: Diagnosis not present

## 2020-12-15 DIAGNOSIS — E119 Type 2 diabetes mellitus without complications: Secondary | ICD-10-CM | POA: Diagnosis not present

## 2020-12-15 DIAGNOSIS — M5136 Other intervertebral disc degeneration, lumbar region: Secondary | ICD-10-CM | POA: Diagnosis not present

## 2020-12-15 DIAGNOSIS — J441 Chronic obstructive pulmonary disease with (acute) exacerbation: Secondary | ICD-10-CM | POA: Insufficient documentation

## 2020-12-15 DIAGNOSIS — M6281 Muscle weakness (generalized): Secondary | ICD-10-CM

## 2020-12-15 NOTE — Patient Outreach (Addendum)
Medicaid Managed Care Pharmacy Note  12/09/20 Name:  Russell Collier MRN:  [id number] DOB:  1960/02/26  Russell Collier is an 60 y.o. year old male who is a primary patient of [name], MD.  The [facility] Managed Care Coordination team was consulted for assistance with disease management and care coordination needs.    Engaged with patient by telephone for follow up visit in response to referral for case management and/or care coordination services.  Mr. Amano was given information about Medicaid Managed Care Coordination team services today. Russell Collier Patient agreed to services and verbal consent obtained.  Objective:  Lab Results  Component Value Date   CREATININE 1.22 12/04/2020   CREATININE 1.45 (H) 09/22/2020   CREATININE CANCELED 09/16/2020    Lab Results  Component Value Date   HGBA1C 6.2 09/16/2020       Component Value Date/Time   CHOL 119 09/16/2020 1421   TRIG 104 09/16/2020 1421   HDL 33 (L) 09/16/2020 1421   CHOLHDL 3.6 09/16/2020 1421   CHOLHDL 3.3 07/28/2019 0633   VLDL 15 07/28/2019 0633   LDLCALC 66 09/16/2020 1421    BP Readings from Last 3 Encounters:  12/12/20 120/70  12/04/20 (!) 141/81  12/04/20 131/87   Care Plan  Allergies  Allergen Reactions   Varenicline Other (See Comments)    Patient reports laryngospasm stopped in the ER   Bupropion Other (See Comments)    Headache - moderate/severe - self discontinued agent     Medications Reviewed Today     Reviewed by Leavy Cella, RPH-CPP (Pharmacist) on 12/12/20 at 1127  Med List Status: <None>   Medication Order Taking? Sig Documenting Provider Last Dose Status Informant  Accu-Chek Softclix Lancets lancets [id number] No Use as instructed [name], MD Taking Active   albuterol (PROVENTIL) (2.5 MG/3ML) 0.083% nebulizer solution [id number] No Take 3 mLs (2.5 mg total) by nebulization every 6 (six) hours as needed for wheezing or shortness of breath. [name], MD Taking  Active   albuterol (VENTOLIN HFA) 108 (90 Base) MCG/ACT inhaler [id number] No Inhale 2 puffs into the lungs every 6 (six) hours as needed for wheezing or shortness of breath. [name], MD Taking Active   apixaban (ELIQUIS) 2.5 MG TABS tablet [id number] No Take 1 tablet (2.5 mg total) by mouth 2 (two) times daily. [name], MD Taking Active   atorvastatin (LIPITOR) 80 MG tablet [id number] No Take 1 tablet (80 mg total) by mouth daily. [name], MD Taking Active   baclofen (LIORESAL) 10 MG tablet [id number] No Take 0.5-1 tablets (5-10 mg total) by mouth 3 (three) times daily as needed. [name], MD Taking Active   Blood Glucose Monitoring Suppl (ACCU-CHEK GUIDE) w/Device KIT [id number] No 1 Units by Does not apply route daily. [name], MD Taking Active   dapagliflozin propanediol (FARXIGA) 10 MG TABS tablet [id number] No Take 1 tablet (10 mg total) by mouth daily before breakfast. [name], MD Taking Active   dexamethasone (DECADRON) injection 2 mg [id number]   Russell Collier, DPM  Active   enalapril (VASOTEC) 20 MG tablet [id number] No Take 1 tablet (20 mg total) by mouth at bedtime. [name], MD Taking Active   fluticasone [facility]) 50 MCG/ACT nasal spray [id number] No Place 1 spray into both nostrils daily. [name], MD Taking Active   furosemide (LASIX) 40 MG tablet [id number] No Take 1 tablet (40 mg total) by mouth 2 (two) times daily. [name], MD Taking Active   glucose blood (  ACCU-CHEK GUIDE) test strip [id number] No Use as instructed [name], MD Taking Active   isosorbide mononitrate (IMDUR) 30 MG 24 hr tablet [id number] No Take 1 tablet (30 mg total) by mouth daily. [name], MD Taking Active   Multiple Vitamin (MULTIVITAMIN WITH MINERALS) TABS tablet [id number] No Take 1 tablet by mouth daily. 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 Collier, Russell Pascal, MD  Active   Tafamidis 61 MG CAPS [id number] No TAKE 1 CAPSULE BY MOUTH DAILY. [name], MD Taking Active   umeclidinium-vilanterol [facility] ELLIPTA) 62.5-25 MCG/INH AEPB [id number] No Inhale 1 puff into the lungs daily as needed. [name], MD Taking Active   valACYclovir (VALTREX) 500 MG tablet [id number] No Take 1 tablet (500 mg total) by mouth daily. 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   COPD exacerbation ([place]) 03/20/2020   Type 2 diabetes mellitus with diabetic peripheral angiopathy without gangrene, without long-term current use of insulin ([place]) 03/20/2020   Coagulation defect ([place]) 12/11/2019   Fatigue 12/10/2019   Leg cramps 12/10/2019   Mild epistaxis 10/30/2019   Disc degeneration, 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   Ganglion of right knee 09/27/2019   Lumbar radiculopathy 09/27/2019   Lower extremity edema    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[place])    Acute respiratory failure ([name]) 07/27/2019   Primary osteoarthritis of right knee 07/24/2019   Breast tenderness in male 05/31/2019   Chronic pain 05/31/2019   AKI (acute kidney injury) ([name]) 05/31/2019   Pain due to onychomycosis of toenails of both feet 05/16/2019   Chronic knee pain 04/16/2019   Diabetes ([name]) 04/16/2019   Esophageal reflux 02/06/2019   Heartburn 02/06/2019   Chronic skin ulcer of lower leg ([name]) 12/21/2018   S/P drug eluting coronary stent placement    Coronary artery  disease involving native coronary artery of native heart with unstable angina pectoris ([place]) 12/20/2018   Unstable angina (HCC)    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   Acute on chronic diastolic heart failure (HCC)    Shortness of breath    Precordial chest pain    Idiopathic peripheral neuropathy    Palpitations    Chronic diastolic heart failure (HCC)    Abnormal ankle brachial index (ABI)    Chest pain, rule out acute myocardial infarction 09/26/2018   History of cocaine abuse ([place]) 08/20/2018   Marijuana use 08/20/2018   Morbid obesity with BMI of 40.0-44.9, adult ([place]) 08/20/2018   Dyspepsia    Morbid obesity (HCC)    OSA on CPAP    Chest pain 05/30/2018   CAD (coronary artery disease) 03/30/2018   Elevated troponin 02/03/2018   Positive urine drug screen 02/03/2018   Tobacco abuse 02/03/2018   Hyperlipidemia 02/03/2018   Acute respiratory failure with hypoxia ([place]) 02/02/2018   Acute congestive heart failure (HCC)    Keratoconjunctivitis sicca of left eye not specified  as Sjogren's 09/06/2017   Long term current use of oral hypoglycemic drug 09/06/2017   Mechanical ectropion of left lower eyelid 09/06/2017   Nuclear sclerotic cataract of both eyes 09/06/2017   Refractive amblyopia, left 09/06/2017   Type 2 diabetes mellitus without complication, without long-term current use of insulin ([place]) 09/06/2017   Hyperopia of both eyes with astigmatism and presbyopia 09/06/2017   Atypical chest pain 12/27/2015   Essential hypertension    Neuropathy     Conditions to be addressed/monitored per PCP order:  CHF, HTN, HLD, DMII, and Tobacco Use  Care Plan : General Pharmacy (Adult)  Updates made by Hughes Better, RPH-CPP since 12/15/2020 12:00 AM     Problem: Chronic Disease Management   Priority: High     Long-Range Goal: Managing chronic diseases   Start Date: 11/14/2020  Expected End Date: 02/15/2021  Recent Progress: On track  Priority: High  Note:   Current Barriers:   Unable to independently afford treatment regimen since having medications switched to pill packaging. Update 12/09/20 Resolved since switching patient's prescriptions back to [place]. Patient verbalized having picked up all his medications   Pharmacist Clinical Goal(s):  patient will verbalize ability to afford treatment regimen maintain control of Type 2 diabetes as evidenced by home blood glucose readings  achieve improvement in smoking cessation as evidenced by decrease in daily cigarette use adhere to prescribed medication regimen as evidenced by appropriately refilling medications  through collaboration with PharmD and provider.   Interventions: Inter-disciplinary care team collaboration (see longitudinal plan of care) Comprehensive medication review performed; medication list updated in electronic medical record  Diabetes:  Controlled; current treatment: farxiga 64m   Current glucose readings: fasting glucose: 90's-low 100's  Denies hypoglycemic/hyperglycemic symptoms  Current meal patterns:  Breakfast: veggie rich crackers + coffee Snacks: fruit, Milkshake 3x/week Patient reports working towards decreasing eating out   Current exercise: physical therapy   Educated on dietary choices for recommended in T2DM Recommended patient continue to check blood glucose daily    Tobacco Abuse:  1/2 packs per day; 54 years of use; Patient reports the nicotine patches cause him skin irritation  Triggers to smoke: stress, back and leg pain, boredom   Recommended patient continue to work with PJaneann Forehand PharmD on tobacco cessation.  Patient Goals/Self-Care Activities Over the next 90 days, patient will:  - take medications as prescribed check glucose daily, document, and provide at future appointments Continue to work with PJaneann Forehand PharmD to work towards smoking cessation.  Follow Up Plan: Telephone follow up appointment with care management team member scheduled for:  12/30/20 with RN Case Manager; 01/02/21 with Social Worker;       RHughes BetterPharmD, CPP High Risk Managed Medicaid [place]([phone]

## 2020-12-15 NOTE — Patient Instructions (Signed)
Visit Information  Russell Collier was given information about Medicaid Managed Care team care coordination services as a part of their [place] Medicaid benefit. Russell Collier verbally consented to engagement with the [facility] Managed Care team.   If you are experiencing a medical emergency, please call 911 or report to your local emergency department or urgent care.   If you have a non-emergency medical problem during routine business hours, please contact your provider's office and ask to speak with a nurse.   For questions related to your [facility], please call: [phone] or visit the homepage here: [email or web address]  If you would like to schedule transportation through your [facility], please call the following number at least 2 days in advance of your appointment: [phone].   Call the [place] at [phone], at any time, 24 hours a day, 7 days a week. If you are in danger or need immediate medical attention call 911.  If you would like help to quit smoking, call 1-800-QUIT-NOW (479) 448-1221) OR Espaol: 1-855-Djelo-Ya (7-654-650-3546) o para ms informacin haga clic aqu or Text READY to 200-400 to register via text  Russell Collier - following are the goals we discussed in your visit today:   Goals Addressed   None     The patient verbalized understanding of instructions provided today and declined a print copy of patient instruction materials.   RN Care Manager will call on 12/30/20 Social Worker will 01/02/21.  Pharmacist will 03/10/20  Hughes Better PharmD, CPP High Risk Managed Medicaid [place] [phone]   Following is a copy of your plan of care:   Patient Care Plan: General Pharmacy (Adult)     Problem Identified: Chronic disease state management Resolved 11/17/2020  Priority: High  Onset Date:  11/14/2020      Long-Range Goal: Managing chronic diseases   Start Date: 11/14/2020  Expected End Date: 02/15/2021  Recent Progress: On track  Priority: High  Note:   Current Barriers:  Unable to independently afford treatment regimen since having medications switched to pill packaging. Update 12/09/20 Resolved since switching patient's prescriptions back to [place]. Patient verbalized having picked up all his medications   Pharmacist Clinical Goal(s):  patient will verbalize ability to afford treatment regimen maintain control of Type 2 diabetes as evidenced by home blood glucose readings  achieve improvement in smoking cessation as evidenced by decrease in daily cigarette use adhere to prescribed medication regimen as evidenced by appropriately refilling medications  through collaboration with PharmD and provider.   Interventions: Inter-disciplinary care team collaboration (see longitudinal plan of care) Comprehensive medication review performed; medication list updated in electronic medical record  Diabetes:  Controlled; current treatment: farxiga 10mg    Current glucose readings: fasting glucose: 90's-low 100's  Denies hypoglycemic/hyperglycemic symptoms  Current meal patterns:  Breakfast: veggie rich crackers + coffee Snacks: fruit, Milkshake 3x/week Patient reports working towards decreasing eating out   Current exercise: physical therapy   Educated on dietary choices for recommended in T2DM Recommended patient continue to check blood glucose daily    Tobacco Abuse:  1/2 packs per day; 54 years of use; Patient reports the nicotine patches cause him skin irritation  Triggers to smoke: stress, back and leg pain, boredom   Recommended patient continue to work with Janeann Forehand, PharmD on tobacco cessation.  Patient Goals/Self-Care Activities Over the next 90 days, patient will:  - take medications as prescribed check glucose daily, document, and provide  at future  appointments Continue to work with Janeann Forehand, PharmD to work towards smoking cessation.  Follow Up Plan: Telephone follow up appointment with care management team member scheduled for: 12/30/20 with RN Case Manager; 01/02/21 with Social Worker; 03/10/20 with PharmD
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
## 2020-12-15 NOTE — ED Triage Notes (Signed)
C/o intermittent nosebleed x4 days, started today approx 1300, stopped but began again approx 50min 29min PTA EMS. Bleeding stopped/ controlled in triage Pt is on eliquis for stent, hx CHF, CAD, HTN  VSS

## 2020-12-16 ENCOUNTER — Emergency Department (HOSPITAL_COMMUNITY)
Admission: EM | Admit: 2020-12-16 | Discharge: 2020-12-16 | Disposition: A | Discharge status: Home / Self Care | Payer: Medicaid Other | Attending: Emergency Medicine | Admitting: Emergency Medicine

## 2020-12-16 DIAGNOSIS — R040 Epistaxis: Secondary | ICD-10-CM

## 2020-12-16 DIAGNOSIS — M5136 Other intervertebral disc degeneration, lumbar region: Secondary | ICD-10-CM | POA: Diagnosis not present

## 2020-12-16 LAB — COMPREHENSIVE METABOLIC PANEL
ALT: 34 U/L (ref 0–44)
AST: 30 U/L (ref 15–41)
Albumin: 3.6 g/dL (ref 3.5–5.0)
Alkaline Phosphatase: 84 U/L (ref 38–126)
Anion gap: 11 (ref 5–15)
BUN: 24 mg/dL — ABNORMAL HIGH (ref 8–23)
CO2: 25 mmol/L (ref 22–32)
Calcium: 8.8 mg/dL — ABNORMAL LOW (ref 8.9–10.3)
Chloride: 100 mmol/L (ref 98–111)
Creatinine, Ser: 1.74 mg/dL — ABNORMAL HIGH (ref 0.61–1.24)
GFR, Estimated: 44 mL/min — ABNORMAL LOW (ref >60)
Glucose, Bld: 101 mg/dL — ABNORMAL HIGH (ref 70–99)
Potassium: 3.8 mmol/L (ref 3.5–5.1)
Sodium: 136 mmol/L (ref 135–145)
Total Bilirubin: 1 mg/dL (ref 0.3–1.2)
Total Protein: 6.9 g/dL (ref 6.5–8.1)

## 2020-12-16 LAB — CBC WITH DIFFERENTIAL/PLATELET
Abs Immature Granulocytes: 0.05 10*3/uL (ref 0.00–0.07)
Basophils Absolute: 0 10*3/uL (ref 0.0–0.1)
Basophils Relative: 0 %
Eosinophils Absolute: 0.1 10*3/uL (ref 0.0–0.5)
Eosinophils Relative: 1 %
HCT: 41.1 % (ref 39.0–52.0)
Hemoglobin: 13.4 g/dL (ref 13.0–17.0)
Immature Granulocytes: 1 %
Lymphocytes Relative: 23 %
Lymphs Abs: 2.4 10*3/uL (ref 0.7–4.0)
MCH: 29.6 pg (ref 26.0–34.0)
MCHC: 32.6 g/dL (ref 30.0–36.0)
MCV: 90.9 fL (ref 80.0–100.0)
Monocytes Absolute: 1.1 10*3/uL — ABNORMAL HIGH (ref 0.1–1.0)
Monocytes Relative: 10 %
Neutro Abs: 6.9 10*3/uL (ref 1.7–7.7)
Neutrophils Relative %: 65 %
Platelets: 262 10*3/uL (ref 150–400)
RBC: 4.52 MIL/uL (ref 4.22–5.81)
RDW: 17.2 % — ABNORMAL HIGH (ref 11.5–15.5)
WBC: 10.5 10*3/uL (ref 4.0–10.5)
nRBC: 0 % (ref 0.0–0.2)

## 2020-12-16 LAB — PROTIME-INR
INR: 1.1 (ref 0.8–1.2)
Prothrombin Time: 14.5 seconds (ref 11.4–15.2)
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
## 2020-12-16 NOTE — ED Provider Notes (Signed)
Emergency Medicine Provider Triage Evaluation Note  Russell Collier , a 60 y.o. male  was evaluated in triage.  Pt complains of nosebleeds.  Intermittent over the past few days.  Currently on eliquis.  Denies bloody stools, gingival bleeding, etc.  Hx of same in the past, had to have medications changed at that time due to ongoing bleeding.  No facial trauma.  Review of Systems  Positive: nosebleeds Negative: Bloody stools  Physical Exam  BP 112/62 (BP Location: Right Arm)   Pulse 79   Temp 98.5 F (36.9 C) (Oral)   Resp 18   SpO2 96%  Gen:   Awake, no distress   Resp:  Normal effort  MSK:   Moves extremities without difficulty  Other:  No active epistaxis, tissue in left nostril has some blood on it, no septal hematoma or deformity  Medical Decision Making  Medically screening exam initiated at 1:13 AM.  Appropriate orders placed.  Russell Collier was informed that the remainder of the evaluation will be completed by another provider, this initial triage assessment does not replace that evaluation, and the importance of remaining in the ED until their evaluation is complete.  Will checks labs, PT-INR.   Larene Pickett, PA-C 12/16/20 [place], [place], DO 12/16/20 [phone]

## 2020-12-16 NOTE — Discharge Instructions (Signed)
You were seen today for nosebleed.  This is likely related to change in temperature and humidity levels outside.  Use nasal saline.  Use a humidifier as needed.  Follow-up with ENT if bleeding recurs.

## 2020-12-17 ENCOUNTER — Telehealth: Payer: Self-pay

## 2020-12-17 DIAGNOSIS — M5136 Other intervertebral disc degeneration, lumbar region: Secondary | ICD-10-CM | POA: Diagnosis not present

## 2020-12-17 NOTE — Telephone Encounter (Signed)
Transition Care Management Follow-up Telephone Call Date of discharge and from where: 12/16/2020 from [facility] How have you been since you were released from the hospital? Pt stated that he is feeling well and does not have any questions or concerns at this time.  Any questions or concerns? No  Items Reviewed: Did the pt receive and understand the discharge instructions provided? Yes  Medications obtained and verified? Yes  Other? No  Any new allergies since your discharge? No  Dietary orders reviewed? No Do you have support at home? Yes   Functional Questionnaire: (I = Independent and D = Dependent) ADLs: I  Bathing/Dressing- I  Meal Prep- I  Eating- I  Maintaining continence- I  Transferring/Ambulation- I  Managing Meds- I   Follow up appointments reviewed:  PCP Hospital f/u appt confirmed? No   Specialist Hospital f/u appt confirmed? No   Are transportation arrangements needed? No  If their condition worsens, is the pt aware to call PCP or go to the Emergency Dept.? Yes Was the patient provided with contact information for the PCP's office or ED? Yes Was to pt encouraged to call back with questions or concerns? Yes .

## 2020-12-18 ENCOUNTER — Other Ambulatory Visit: Payer: Medicaid Other | Admitting: *Deleted

## 2020-12-18 ENCOUNTER — Ambulatory Visit: Payer: Medicaid Other

## 2020-12-18 ENCOUNTER — Other Ambulatory Visit: Payer: Self-pay

## 2020-12-18 DIAGNOSIS — G8929 Other chronic pain: Secondary | ICD-10-CM

## 2020-12-18 DIAGNOSIS — M5136 Other intervertebral disc degeneration, lumbar region: Secondary | ICD-10-CM | POA: Diagnosis not present

## 2020-12-18 DIAGNOSIS — M545 Low back pain, unspecified: Secondary | ICD-10-CM | POA: Diagnosis not present

## 2020-12-18 DIAGNOSIS — R262 Difficulty in walking, not elsewhere classified: Secondary | ICD-10-CM

## 2020-12-18 DIAGNOSIS — M6281 Muscle weakness (generalized): Secondary | ICD-10-CM

## 2020-12-18 NOTE — Patient Outreach (Signed)
Medicaid Managed Care   Nurse Care Manager Note  12/18/2020 Name:  Briton Sellman MRN:  [id number] DOB:  [birth year]  Dariyon Urquilla is an 60 y.o. year old male who is a primary patient of [name], MD.  [facility] Managed Care Coordination team was consulted for assistance with:    DMII and chronic pain  Mr. Loden was given information about Medicaid Managed Care Coordination team services today. [name] Patient agreed to services and verbal consent obtained.  Engaged with patient by telephone for follow up visit in response to provider referral for case management and/or care coordination services.   Assessments/Interventions:  Review of past medical history, allergies, medications, health status, including review of consultants reports, laboratory and other test data, was performed as part of comprehensive evaluation and provision of chronic care management services.  SDOH (Social Determinants of Health) assessments and interventions performed:   Care Plan  Allergies  Allergen Reactions   Varenicline Other (See Comments)    Patient reports laryngospasm stopped in the ER   Bupropion Other (See Comments)    Headache - moderate/severe - self discontinued agent     Medications Reviewed Today     Reviewed by Cherie Ouch, PT (Physical Therapist) on 12/15/20 at 1401  Med List Status: <None>   Medication Order Taking? Sig Documenting Provider Last Dose Status Informant  Accu-Chek Softclix Lancets lancets [id number] No Use as instructed [name], MD Taking Active   albuterol (PROVENTIL) (2.5 MG/3ML) 0.083% nebulizer solution [id number] No Take 3 mLs (2.5 mg total) by nebulization every 6 (six) hours as needed for wheezing or shortness of breath. [name], MD Taking Active   albuterol (VENTOLIN HFA) 108 (90 Base) MCG/ACT inhaler [id number] No Inhale 2 puffs into the lungs every 6 (six) hours as needed for wheezing or shortness of breath. [name], MD Taking Active   apixaban (ELIQUIS) 2.5 MG TABS tablet [id number] No Take 1 tablet (2.5 mg total) by mouth 2 (two) times daily. [name], MD Taking Active   atorvastatin (LIPITOR) 80 MG tablet [id number] No Take 1 tablet (80 mg total) by mouth daily. [name], MD Taking Active   baclofen (LIORESAL) 10 MG tablet [id number] No Take 0.5-1 tablets (5-10 mg total) by mouth 3 (three) times daily as needed. [name], MD Taking Active   Blood Glucose Monitoring Suppl (ACCU-CHEK GUIDE) w/Device KIT [id number] No 1 Units by Does not apply route daily. [name], MD Taking Active   dapagliflozin propanediol (FARXIGA) 10 MG TABS tablet [id number] No Take 1 tablet (10 mg total) by mouth daily before breakfast. [name], MD Taking Active   dexamethasone (DECADRON) injection 2 mg [id number]   [name], DPM  Active   enalapril (VASOTEC) 20 MG tablet [id number] No Take 1 tablet (20 mg total) by mouth at bedtime. [name], MD Taking Active   fluticasone [facility]) 50 MCG/ACT nasal spray [id number] No Place 1 spray into both nostrils daily. [name], MD Taking Active   furosemide (LASIX) 40 MG tablet [id number] No Take 1 tablet (40 mg total) by mouth 2 (two) times daily. [name], MD Taking Active   glucose blood (ACCU-CHEK GUIDE) test strip [id number] No Use as instructed [name], MD Taking Active   isosorbide mononitrate (IMDUR) 30 MG 24 hr tablet [id number] No Take 1 tablet (30 mg total) by mouth daily. [name], MD Taking Active   Multiple Vitamin (MULTIVITAMIN WITH MINERALS) TABS tablet [id number] No Take 1 tablet by mouth daily. 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 [name], [name], MD  Active   Tafamidis 61 MG CAPS [id number] No TAKE 1 CAPSULE BY MOUTH DAILY. [name], MD Taking Active   umeclidinium-vilanterol [facility] ELLIPTA) 62.5-25 MCG/INH AEPB [id number] No Inhale 1 puff into the lungs daily as needed. [name], MD Taking Active   valACYclovir (VALTREX) 500 MG tablet [id number] No Take 1 tablet (500 mg total) by mouth daily. 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   COPD exacerbation ([place]) 03/20/2020   Type 2 diabetes mellitus with diabetic peripheral angiopathy without gangrene, without long-term current use of insulin ([place]) 03/20/2020   Coagulation defect ([place]) 12/11/2019   Fatigue 12/10/2019   Leg cramps 12/10/2019   Mild epistaxis 10/30/2019   Disc degeneration, 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   Ganglion of right knee 09/27/2019   Lumbar radiculopathy 09/27/2019   Lower extremity edema    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[place])    Acute respiratory failure ([place]) 07/27/2019   Primary osteoarthritis of right knee 07/24/2019   Breast tenderness in male 05/31/2019   Chronic pain 05/31/2019   AKI (acute kidney injury) ([name]) 05/31/2019   Pain due to onychomycosis of toenails of both feet 05/16/2019   Chronic knee pain 04/16/2019   Diabetes ([place]) 04/16/2019   Esophageal reflux 02/06/2019   Heartburn 02/06/2019   Chronic skin ulcer of lower leg ([place]) 12/21/2018   S/P drug eluting coronary stent placement    Coronary artery disease involving native coronary artery of native heart with unstable angina pectoris ([place]) 12/20/2018   Unstable angina (HCC)    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   Acute on chronic  diastolic heart failure (HCC)    Shortness of breath    Precordial chest pain    Idiopathic peripheral neuropathy    Palpitations    Chronic diastolic heart failure (HCC)    Abnormal ankle brachial index (ABI)    Chest pain, rule out acute myocardial infarction 09/26/2018   History of cocaine abuse ([place]) 08/20/2018   Marijuana use 08/20/2018   Morbid obesity with BMI of 40.0-44.9, adult ([place]) 08/20/2018   Dyspepsia    Morbid obesity (HCC)    OSA on CPAP    Chest pain 05/30/2018   CAD (coronary artery disease) 03/30/2018   Elevated troponin 02/03/2018   Positive urine drug screen 02/03/2018   Tobacco abuse 02/03/2018   Hyperlipidemia 02/03/2018   Acute respiratory failure with hypoxia ([place]) 02/02/2018   Acute congestive heart failure (HCC)    Keratoconjunctivitis sicca of left eye not specified as Sjogren's 09/06/2017   Long term current use of oral hypoglycemic drug 09/06/2017   Mechanical ectropion of left lower eyelid 09/06/2017   Nuclear sclerotic cataract of both eyes 09/06/2017   Refractive amblyopia, left 09/06/2017   Type 2 diabetes mellitus without complication, without long-term current use of insulin ([place]) 09/06/2017   Hyperopia of  both eyes with astigmatism and presbyopia 09/06/2017   Atypical chest pain 12/27/2015   Essential hypertension    Neuropathy     Conditions to be addressed/monitored per PCP order:  DMII and chronic pain  Care Plan : General Plan of Care (Adult)  Updates made by [name], RN since 12/18/2020 12:00 AM     Problem: Health Promotion or Disease Self-Management (General Plan of Care)      Long-Range Goal: Self-Management Plan Developed   Start Date: 07/11/2020  Expected End Date: 12/30/2020  Recent Progress: On track  Priority: Medium  Note:   Current Barriers:  Ineffective Self Health Maintenance-Mr. Broughton is managing multiple health issues. He recently had back surgery and is having trouble sleeping due to needing a bed. He is  managing right knee pain, in which he is waiting on the scheduling of an MRI. He would like to start PT and work on weight loss. He is managing diabetes with diet alone, recent A1C 6.0. He is wearing compression socks and having a hard time putting them on. Mr. Mcclane is working on smoking cessation and has cut back to 1/2 pack a day. Mr. Quevedo has started PT for degenerative joint disease. He is waiting on [facility] agency to have adequate staffing for PCS. He is utilizing medical transportation provided [facility]. Patient reports smoking a little more, now 1/2-1 pack a day. He would like to quit, but feels like he has made an improvement in his health by cutting out narcotics. Mr. Winograd receives Paramedicine assistance and is very thankful for this help. Mr. Zoll is now receiving PCS 7 days a week, which is very helpful. He is very thankful for the help and support of everyone. He would like to work on smoking cessation and is requesting RNCM reach out to [place] for assistance. He "graduated" from PT and will start aquatic therapy 8/17. Continues to try to lose weight, reports a weight loss of 55# over the last year. Mr. Wessinger is at the Orthopedic office today for follow up. He plans to discuss back PT today. His medications are bubble packed, but is having difficulty affording all of his medications. He has completed the Paramedicine program. Mr. Camps is in PT for his back, however, he had to cancel appointment on 10/10 due to medical transportation being unavailable. His medications are now coming from [employer] and he is appreciative. -Update-Mr. Chiarelli was seen in the ED for nose bleed. He was instructed to follow up with referral to ENT(Dr. Janace Hoard [phone]). Mr. Sangiovanni has left a message on the office voicemail requesting a return call for 2 days. He is frustrated that he has not heard from the office for scheduling and he continues to have intermittent nose bleeds.  Lacks social connections Unable to  perform ADLs independently Does not contact provider office for questions/concerns Currently UNABLE TO independently self manage needs related to chronic health conditions.  Knowledge Deficits related to short term plan for care coordination needs and long term plans for chronic disease management needs Nurse Case Manager Clinical Goal(s):  patient will work with care management team to address care coordination and chronic disease management needs related to knee pain and recent back surgery   Interventions:  Evaluation of current treatment plan related to knee pain and patient's adherence to plan as established by provider. Provided education to patient re: nose bleed Discussed plans with patient for ongoing care management follow up and provided patient with direct contact information for  care management team Collaborate with PCP for new referral to ENT and dermatology Reviewed upcoming appointments Self Care Activities:  Patient will self administer medications as prescribed Patient will attend all scheduled provider appointments Patient will call pharmacy for medication refills Patient will call provider office for new concerns or questions Patient Goals: - use saline spray to moisten nasal membranes and humidifier in your home - follow up with any referrals and attend appointment on 11/1 with PCP - Call ([phone]) or go online [email or web address] for [name] and [name] vouchers offered by [employer] - schedule recommended health tests (blood work, mammogram, colonoscopy, pap test) - attend all scheduled appointments - work on maintaining a diabetic diet, compliant with gout restrictions - work on smoking cessation, call 1-800-QUIT-NOW or contact [place] [phone] for assistance - work with Pharmacist, Janeann Forehand for assistance with smoking cessation Follow Up Plan: Telephone follow up appointment  with care management team member scheduled for:12/30/20 @ 2:30pm      Follow Up:  Patient agrees to Care Plan and Follow-up.  Plan: The Managed Medicaid care management team will reach out to the patient again over the next 14 days.  Date/time of next scheduled RN care management/care coordination outreach:  12/30/20 @ 2:30pm  [name] RN, BSN [place] RN Care Coordinator
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
## 2020-12-18 NOTE — Patient Instructions (Signed)
Visit Information  Russell Collier was given information about Medicaid Managed Care team care coordination services as a part of their [place] Medicaid benefit. Russell Collier verbally consented to engagement with the [facility] Managed Care team.   If you are experiencing a medical emergency, please call 911 or report to your local emergency department or urgent care.   If you have a non-emergency medical problem during routine business hours, please contact your provider's office and ask to speak with a nurse.   For questions related to your [facility], please call: [phone] or visit the homepage here: [email or web address]  If you would like to schedule transportation through your [facility], please call the following number at least 2 days in advance of your appointment: [phone].   Call the [place] at [phone], at any time, 24 hours a day, 7 days a week. If you are in danger or need immediate medical attention call 911.  If you would like help to quit smoking, call 1-800-QUIT-NOW (905)146-1695) OR Espaol: 1-855-Djelo-Ya (6-948-546-2703) o para ms informacin haga clic aqu or Text READY to 200-400 to register via text  Russell Collier - following are the goals we discussed in your visit today:   Goals Addressed             This Visit's Progress    Protect My Health       Timeframe:  Long-Range Goal Priority:  Medium Start Date:    07/11/20                         Expected End Date:   12/30/20                  Follow Up Date 12/30/20   - use saline spray to moisten nasal membranes and humidifier in your home - follow up with any referrals and attend appointment on 11/1 with PCP - Follow up with PCP with any health related concerns or questions - Call ([phone]) or go online  [email or web address] for [name] and [name] vouchers offered by [employer] - schedule recommended health tests (blood work, mammogram, colonoscopy, pap test) - attend all scheduled appointments - work on maintaining a diabetic diet, compliant with gout restrictions - work on smoking cessation, call 1-800-QUIT-NOW or contact [place] [phone] for assistance - work with Pharmacist, Russell Collier for assistance with smoking cessation    Why is this important?   Screening tests can find diseases early when they are easier to treat.  Your doctor or nurse will talk with you about which tests are important for you.  Getting shots for common diseases like the flu and shingles will help prevent them.             Please see education materials related to nose bleed provided as print materials.   The patient verbalized understanding of instructions provided today and agreed to receive a mailed copy of patient instruction and/or educational materials.  Telephone follow up appointment with Managed Medicaid care management team member scheduled for:12/30/20 @ 2:30p  Russell Joiner RN, [place] RN Care Coordinator   Following is a copy of your plan of care:  Patient Care Plan: Medication Management  Completed 11/17/2020   Problem Identified: Health Promotion or Disease Self-Management (General Plan of Care) Resolved 11/17/2020     Goal: Medication Management Completed 11/17/2020  Note:   Current  Barriers:  Unable to independently monitor therapeutic efficacy Unable to self administer medications as prescribed   Pharmacist Clinical Goal(s):  Over the next 30 days, patient will achieve adherence to monitoring guidelines and medication adherence to achieve therapeutic efficacy through collaboration with PharmD and provider.    Interventions: Inter-disciplinary care team  collaboration (see longitudinal plan of care) Comprehensive medication review performed; medication list updated in electronic medical record  [email or web address] @ Health Maintenance  Patient Goals/Self-Care Activities Over the next 30 days, patient will:  - take medications as prescribed collaborate with provider on medication access solutions  Follow Up Plan: The care management team will reach out to the patient again over the next 30 days.       Patient Care Plan: General Plan of Care (Adult)     Problem Identified: Health Promotion or Disease Self-Management (General Plan of Care)      Long-Range Goal: Self-Management Plan Developed   Start Date: 07/11/2020  Expected End Date: 12/30/2020  Recent Progress: On track  Priority: Medium  Note:   Current Barriers:  Ineffective Self Health Maintenance-Russell Collier is managing multiple health issues. He recently had back surgery and is having trouble sleeping due to needing a bed. He is managing right knee pain, in which he is waiting on the scheduling of an MRI. He would like to start PT and work on weight loss. He is managing diabetes with diet alone, recent A1C 6.0. He is wearing compression socks and having a hard time putting them on. Russell Collier is working on smoking cessation and has cut back to 1/2 pack a day. Russell Collier has started PT for degenerative joint disease. He is waiting on [facility] agency to have adequate staffing for PCS. He is utilizing medical transportation provided by [facility]. Patient reports smoking a little more, now 1/2-1 pack a day. He would like to quit, but feels like he has made an improvement in his health by cutting out narcotics. Russell Collier receives Paramedicine assistance and is very thankful for this help. Russell Collier is now receiving PCS 7 days a week, which is very helpful. He is very thankful for the help and support of everyone. He would like to work on smoking cessation and is requesting RNCM reach out to [place] for  assistance. He "graduated" from PT and will start aquatic therapy 8/17. Continues to try to lose weight, reports a weight loss of 55# over the last year. Russell Collier is at the Orthopedic office today for follow up. He plans to discuss back PT today. His medications are bubble packed, but is having difficulty affording all of his medications. He has completed the Paramedicine program. Mr. Fambro is in PT for his back, however, he had to cancel appointment on 10/10 due to medical transportation being unavailable. His medications are now coming from [employer] and he is appreciative. -Update-Mr. Zeitlin was seen in the ED for nose bleed. He was instructed to follow up with referral to ENT(Dr. Janace Hoard [phone]). Mr. Enderle has left a message on the office voicemail requesting a return call for 2 days. He is frustrated that he has not heard from the office for scheduling and he continues to have intermittent nose bleeds.  Lacks social connections Unable to perform ADLs independently Does not contact provider office for questions/concerns Currently UNABLE TO independently self manage needs related to chronic health conditions.  Knowledge Deficits related to short term plan for care coordination needs and long term plans for chronic disease management needs Nurse Case  Manager Clinical Goal(s):  patient will work with care management team to address care coordination and chronic disease management needs related to knee pain and recent back surgery   Interventions:  Evaluation of current treatment plan related to knee pain and patient's adherence to plan as established by provider. Provided education to patient re: nose bleed Discussed plans with patient for ongoing care management follow up and provided patient with direct contact information for care management team Collaborate with PCP for new referral to ENT and dermatology Reviewed upcoming appointments Self Care Activities:  Patient will self  administer medications as prescribed Patient will attend all scheduled provider appointments Patient will call pharmacy for medication refills Patient will call provider office for new concerns or questions Patient Goals: - use saline spray to moisten nasal membranes and humidifier in your home - follow up with any referrals and attend appointment on 11/1 with PCP - Call ([phone]) or go online [email or web address] for [name] and [name] vouchers offered by [employer] - schedule recommended health tests (blood work, mammogram, colonoscopy, pap test) - attend all scheduled appointments - work on maintaining a diabetic diet, compliant with gout restrictions - work on smoking cessation, call 1-800-QUIT-NOW or contact [place] [phone] for assistance - work with Pharmacist, Russell Collier for assistance with smoking cessation Follow Up Plan: Telephone follow up appointment with care management team member scheduled for:12/30/20 @ 2:30pm     Patient Care Plan: General Pharmacy (Adult)     Problem Identified: Chronic disease state management Resolved 11/17/2020  Priority: High  Onset Date: 11/14/2020     Long-Range Goal: Manage chronic disease therapy Completed 11/17/2020  Start Date: 11/14/2020  Expected End Date: 02/12/2021  This Visit's Progress: On track  Priority: High  Note:   Current Barriers:  Unable to independently afford treatment regimen since having medications switched to pill packaging.    Pharmacist Clinical Goal(s):  patient will verbalize ability to afford treatment regimen maintain control of Type 2 diabetes as evidenced by home blood glucose readings  achieve improvement in smoking cessation as evidenced by decrease in daily cigarette use adhere to prescribed medication regimen as evidenced by appropriately refilling medications  through collaboration with PharmD and provider.    Interventions: Inter-disciplinary care team collaboration (see longitudinal plan of care) Comprehensive medication review performed; medication list updated in electronic medical record  Diabetes:  Controlled; current treatment: farxiga 10mg    Current glucose readings: fasting glucose: 90's-low 100's  Denies hypoglycemic/hyperglycemic symptoms  Current meal patterns:  Breakfast: veggie rich crackers + coffee Snacks: fruit, Milkshake 3x/week Patient reports working towards decreasing eating out   Current exercise: physical therapy   Educated on dietary choices for recommended in T2DM Recommended patient continue to check blood glucose daily    Tobacco Abuse:  1/2 packs per day; 54 years of use; Patient reports the nicotine patches cause him skin irritation  Triggers to smoke: stress, back and leg pain, boredom   Recommended patient continue to work with Russell Collier, PharmD on tobacco cessation.  Patient Goals/Self-Care Activities Over the next 90 days, patient will:  - take medications as prescribed check glucose daily, document, and provide at future appointments collaborate with provider on medication access solutions Continue to work with Russell Collier, PharmD to work towards smoking cessation.  Follow Up Plan: Telephone follow up appointment with care management team member scheduled for: 11/27/20 with RN Case Manager; 12/09/20 with PharmD    Problem Identified: Chronic Disease Management   Priority: High  Long-Range Goal: Managing chronic diseases   Start Date: 11/14/2020  Expected End Date: 02/15/2021  Recent Progress: On track  Priority: High  Note:   Current Barriers:  Unable to independently afford treatment regimen since having medications switched to pill packaging. Update 12/09/20 Resolved since switching patient's prescriptions back to [facility]. Patient verbalized having picked up all his medications   Pharmacist Clinical Goal(s):  patient will  verbalize ability to afford treatment regimen maintain control of Type 2 diabetes as evidenced by home blood glucose readings  achieve improvement in smoking cessation as evidenced by decrease in daily cigarette use adhere to prescribed medication regimen as evidenced by appropriately refilling medications  through collaboration with PharmD and provider.   Interventions: Inter-disciplinary care team collaboration (see longitudinal plan of care) Comprehensive medication review performed; medication list updated in electronic medical record  Diabetes:  Controlled; current treatment: farxiga 10mg    Current glucose readings: fasting glucose: 90's-low 100's  Denies hypoglycemic/hyperglycemic symptoms  Current meal patterns:  Breakfast: veggie rich crackers + coffee Snacks: fruit, Milkshake 3x/week Patient reports working towards decreasing eating out   Current exercise: physical therapy   Educated on dietary choices for recommended in T2DM Recommended patient continue to check blood glucose daily    Tobacco Abuse:  1/2 packs per day; 54 years of use; Patient reports the nicotine patches cause him skin irritation  Triggers to smoke: stress, back and leg pain, boredom   Recommended patient continue to work with Russell Collier, PharmD on tobacco cessation.  Patient Goals/Self-Care Activities Over the next 90 days, patient will:  - take medications as prescribed check glucose daily, document, and provide at future appointments Continue to work with Russell Collier, PharmD to work towards smoking cessation.  Follow Up Plan: Telephone follow up appointment with care management team member scheduled for: 12/30/20 with RN Case Manager; 01/02/21 with Social Worker;

## 2020-12-19 ENCOUNTER — Other Ambulatory Visit: Payer: Self-pay | Admitting: *Deleted

## 2020-12-19 DIAGNOSIS — M5136 Other intervertebral disc degeneration, lumbar region: Secondary | ICD-10-CM | POA: Diagnosis not present

## 2020-12-19 NOTE — Patient Outreach (Signed)
Care Coordination  12/19/2020  Russell Collier 19-Jun-1960 [id number]  RNCM calling [name] re: update on referral to ENT and Dermatology. RNCM messaged Dr. Caron Presume requesting referrals for ENT and Dermatology. Patient attempted to schedule ENT from ED referral. Dr. Caron Presume will address requested referrals in the office during appointment scheduled on 11/1. RNCM explained this to [name]. Patient voiced understanding and expressed appreciation.  [name] RN, BSN [place]  [employer] manager

## 2020-12-22 DIAGNOSIS — M5136 Other intervertebral disc degeneration, lumbar region: Secondary | ICD-10-CM | POA: Diagnosis not present

## 2020-12-23 ENCOUNTER — Ambulatory Visit: Payer: Medicaid Other | Admitting: Podiatry

## 2020-12-23 ENCOUNTER — Other Ambulatory Visit: Payer: Self-pay

## 2020-12-23 ENCOUNTER — Ambulatory Visit (INDEPENDENT_AMBULATORY_CARE_PROVIDER_SITE_OTHER): Payer: Medicaid Other | Admitting: Family Medicine

## 2020-12-23 ENCOUNTER — Encounter: Payer: Self-pay | Admitting: Family Medicine

## 2020-12-23 VITALS — BP 118/60 | HR 73 | Ht 71.0 in | Wt 310.0 lb

## 2020-12-23 DIAGNOSIS — R040 Epistaxis: Secondary | ICD-10-CM | POA: Diagnosis not present

## 2020-12-23 DIAGNOSIS — Z23 Encounter for immunization: Secondary | ICD-10-CM | POA: Diagnosis not present

## 2020-12-23 DIAGNOSIS — N179 Acute kidney failure, unspecified: Secondary | ICD-10-CM | POA: Diagnosis not present

## 2020-12-23 DIAGNOSIS — Z9989 Dependence on other enabling machines and devices: Secondary | ICD-10-CM | POA: Diagnosis not present

## 2020-12-23 DIAGNOSIS — G4733 Obstructive sleep apnea (adult) (pediatric): Secondary | ICD-10-CM

## 2020-12-23 DIAGNOSIS — M5136 Other intervertebral disc degeneration, lumbar region: Secondary | ICD-10-CM | POA: Diagnosis not present

## 2020-12-23 DIAGNOSIS — I10 Essential (primary) hypertension: Secondary | ICD-10-CM | POA: Diagnosis not present

## 2020-12-23 DIAGNOSIS — D2339 Other benign neoplasm of skin of other parts of face: Secondary | ICD-10-CM | POA: Diagnosis not present

## 2020-12-23 DIAGNOSIS — E1151 Type 2 diabetes mellitus with diabetic peripheral angiopathy without gangrene: Secondary | ICD-10-CM | POA: Diagnosis not present

## 2020-12-23 LAB — POCT GLYCOSYLATED HEMOGLOBIN (HGB A1C): HbA1c, POC (controlled diabetic range): 6.2 % (ref 0.0–7.0)

## 2020-12-23 NOTE — Patient Instructions (Addendum)
Make an appointment on your way out with our derm clinic for possible cyst removal.   Someone should call with the ENT referral.   Google "sleep hygiene."  Hopefully you will get an idea or two about how to have a better night sleep.   Absolutely quit smoking. Come back in two weeks for a blood test to recheck your kidneys.

## 2020-12-24 ENCOUNTER — Ambulatory Visit: Payer: Medicaid Other | Admitting: Physical Therapy

## 2020-12-24 ENCOUNTER — Encounter: Payer: Self-pay | Admitting: Family Medicine

## 2020-12-24 ENCOUNTER — Ambulatory Visit: Payer: Medicaid Other | Attending: Orthopaedic Surgery | Admitting: Physical Therapy

## 2020-12-24 DIAGNOSIS — M79661 Pain in right lower leg: Secondary | ICD-10-CM | POA: Diagnosis present

## 2020-12-24 DIAGNOSIS — R262 Difficulty in walking, not elsewhere classified: Secondary | ICD-10-CM | POA: Insufficient documentation

## 2020-12-24 DIAGNOSIS — R2689 Other abnormalities of gait and mobility: Secondary | ICD-10-CM | POA: Insufficient documentation

## 2020-12-24 DIAGNOSIS — R258 Other abnormal involuntary movements: Secondary | ICD-10-CM | POA: Insufficient documentation

## 2020-12-24 DIAGNOSIS — M5136 Other intervertebral disc degeneration, lumbar region: Secondary | ICD-10-CM | POA: Diagnosis not present

## 2020-12-24 DIAGNOSIS — G629 Polyneuropathy, unspecified: Secondary | ICD-10-CM | POA: Diagnosis present

## 2020-12-24 DIAGNOSIS — M6281 Muscle weakness (generalized): Secondary | ICD-10-CM | POA: Diagnosis present

## 2020-12-24 DIAGNOSIS — G8929 Other chronic pain: Secondary | ICD-10-CM | POA: Insufficient documentation

## 2020-12-24 DIAGNOSIS — M25561 Pain in right knee: Secondary | ICD-10-CM | POA: Insufficient documentation

## 2020-12-24 DIAGNOSIS — M545 Low back pain, unspecified: Secondary | ICD-10-CM | POA: Insufficient documentation

## 2020-12-24 DIAGNOSIS — D2339 Other benign neoplasm of skin of other parts of face: Secondary | ICD-10-CM | POA: Insufficient documentation

## 2020-12-24 NOTE — Progress Notes (Signed)
    SUBJECTIVE:   CHIEF COMPLAINT / HPI:   Multiple issues Recurrent epistaxis.  On apixiban.  Want ENT referral for possible cauterization. Wants derm referral for cyst on left side of nose.  It has previously been drained and has recured. Type 2 DM on farxiga.  No complaints.  Due for A1C. Worsening CKD versus AKI.  Recent creat up in two week period.  No new meds.  Just checked one week ago.  Strangely, had a turp at age 60.  Reviewed CT from two years, no hydronephrosis.  States voiding is OK.   Tired.  On CPAP and still sleeps poorly.  TV in room.  He has not previously heard of sleep hygeine.    OBJECTIVE:   BP 118/60   Pulse 73   Ht 5\' 11"  (1.803 m)   Wt (!) 310 lb (140.6 kg)   SpO2 96%   BMI 43.24 kg/m   VS noted Lungs clear Cardiac RRR without m or g Skin.  Cyst on left side of nose.  Ackward place for removal.    ASSESSMENT/PLAN:   Frequent epistaxis Refer to ENT.  Would like him to stay on apixiban.  Type 2 diabetes mellitus with diabetic peripheral angiopathy without gangrene, without long-term current use of insulin (HCC) Good control on current meds.  Encourage wt loss  Dermoid cyst of skin of nose Refer to our derm clinic.  May be a good candidate for the drainage via punch biopsy.  AKI (acute kidney injury) ([place]) Unclear what baseline is - appears to be less than 1.0  OSA on CPAP Still with insomnia.  Directed him to sleep hygiene resources.     Zenia Resides, MD [place]

## 2020-12-24 NOTE — Assessment & Plan Note (Signed)
Good control on current meds.  Encourage wt loss

## 2020-12-24 NOTE — Assessment & Plan Note (Signed)
Refer to our derm clinic.  May be a good candidate for the drainage via punch biopsy.

## 2020-12-24 NOTE — Assessment & Plan Note (Signed)
Still with insomnia.  Directed him to sleep hygiene resources.

## 2020-12-24 NOTE — Assessment & Plan Note (Signed)
Unclear what baseline is - appears to be less than 1.0

## 2020-12-24 NOTE — Assessment & Plan Note (Signed)
Refer to ENT.  Would like him to stay on apixiban.

## 2020-12-25 DIAGNOSIS — M5136 Other intervertebral disc degeneration, lumbar region: Secondary | ICD-10-CM | POA: Diagnosis not present
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
## 2020-12-26 DIAGNOSIS — M5136 Other intervertebral disc degeneration, lumbar region: Secondary | ICD-10-CM | POA: Diagnosis not present

## 2020-12-29 DIAGNOSIS — M5136 Other intervertebral disc degeneration, lumbar region: Secondary | ICD-10-CM | POA: Diagnosis not present

## 2020-12-30 ENCOUNTER — Other Ambulatory Visit: Payer: Self-pay

## 2020-12-30 DIAGNOSIS — M5136 Other intervertebral disc degeneration, lumbar region: Secondary | ICD-10-CM | POA: Diagnosis not present

## 2020-12-31 ENCOUNTER — Other Ambulatory Visit: Payer: Self-pay

## 2020-12-31 ENCOUNTER — Ambulatory Visit: Payer: Medicaid Other

## 2020-12-31 DIAGNOSIS — M6281 Muscle weakness (generalized): Secondary | ICD-10-CM

## 2020-12-31 DIAGNOSIS — M79661 Pain in right lower leg: Secondary | ICD-10-CM

## 2020-12-31 DIAGNOSIS — M545 Low back pain, unspecified: Secondary | ICD-10-CM

## 2020-12-31 DIAGNOSIS — G629 Polyneuropathy, unspecified: Secondary | ICD-10-CM

## 2020-12-31 DIAGNOSIS — G8929 Other chronic pain: Secondary | ICD-10-CM

## 2020-12-31 DIAGNOSIS — R262 Difficulty in walking, not elsewhere classified: Secondary | ICD-10-CM

## 2020-12-31 DIAGNOSIS — R2689 Other abnormalities of gait and mobility: Secondary | ICD-10-CM

## 2020-12-31 DIAGNOSIS — M5136 Other intervertebral disc degeneration, lumbar region: Secondary | ICD-10-CM | POA: Diagnosis not present
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
## 2021-01-01 ENCOUNTER — Telehealth: Payer: Self-pay | Admitting: Pharmacist

## 2021-01-01 ENCOUNTER — Other Ambulatory Visit (HOSPITAL_COMMUNITY): Payer: Self-pay

## 2021-01-01 DIAGNOSIS — M5136 Other intervertebral disc degeneration, lumbar region: Secondary | ICD-10-CM | POA: Diagnosis not present

## 2021-01-01 DIAGNOSIS — Z720 Tobacco use: Secondary | ICD-10-CM

## 2021-01-01 NOTE — Telephone Encounter (Signed)
-----   Message from Leavy Cella, [place] sent at 12/12/2020 11:32 AM EDT ----- Regarding: Tobacco Cessation - Cut down

## 2021-01-01 NOTE — Assessment & Plan Note (Signed)
Patient contacted for follow/up of tobacco intake reduction attempt.   Since last contact patient reports continued smoking 10 cigarettes per day.   Medications currently being used; None.    Patient denies any significant side effects from tobacco cessation therapy.   Rates IMPORTANCE of quitting tobacco as high.  Rates CONFIDENCE of quitting tobacco as low.  Most common triggers to use tobacco include; numerous habits (with coffee, stress, after meals).  Motivation to quit: Improved breathing.    Total time with patient call and documentation of interaction: 13 minutes.  F/U Phone call planned: 3 weeks with goal of smoking 8 or less per day.

## 2021-01-01 NOTE — Telephone Encounter (Signed)
Patient contacted for follow/up of tobacco intake reduction attempt.   Since last contact patient reports continued smoking 10 cigarettes per day.   Medications currently being used; None.    Patient denies any significant side effects from tobacco cessation therapy.   Rates IMPORTANCE of quitting tobacco as high.  Rates CONFIDENCE of quitting tobacco as low.  Most common triggers to use tobacco include; numerous habits (with coffee, stress, after meals).  Motivation to quit: Improved breathing.    Total time with patient call and documentation of interaction: 13 minutes.  F/U Phone call planned: 3 weeks with goal of smoking 8 or less per day.

## 2021-01-02 ENCOUNTER — Other Ambulatory Visit: Payer: Self-pay

## 2021-01-02 ENCOUNTER — Ambulatory Visit (INDEPENDENT_AMBULATORY_CARE_PROVIDER_SITE_OTHER): Payer: Medicaid Other | Admitting: Podiatry

## 2021-01-02 DIAGNOSIS — E1169 Type 2 diabetes mellitus with other specified complication: Secondary | ICD-10-CM

## 2021-01-02 DIAGNOSIS — M5136 Other intervertebral disc degeneration, lumbar region: Secondary | ICD-10-CM | POA: Diagnosis not present

## 2021-01-02 DIAGNOSIS — B351 Tinea unguium: Secondary | ICD-10-CM | POA: Diagnosis not present

## 2021-01-02 DIAGNOSIS — E1151 Type 2 diabetes mellitus with diabetic peripheral angiopathy without gangrene: Secondary | ICD-10-CM | POA: Diagnosis not present

## 2021-01-02 DIAGNOSIS — M109 Gout, unspecified: Secondary | ICD-10-CM

## 2021-01-02 MED ORDER — BETAMETHASONE SOD PHOS & ACET 6 (3-3) MG/ML IJ SUSP
3.0000 mg | Freq: Once | INTRAMUSCULAR | Status: AC
  Administered 2021-01-02: 3 mg

## 2021-01-02 NOTE — Patient Instructions (Signed)
Visit Information  Mr. [name]  - as a part of your Medicaid benefit, you are eligible for care management and care coordination services at no cost or copay. I was unable to reach you by phone today but would be happy to help you with your health related needs. Please feel free to call me [phone].     Mickel Fuchs, BSW, [place] Managed Medicaid Team  [phone]

## 2021-01-02 NOTE — Patient Outreach (Signed)
Care Coordination  01/02/2021  Michel Hendon 1960-10-13 [id number]   Medicaid Managed Care   Unsuccessful Outreach Note  01/02/2021 Name: Russell Collier MRN: [id number] DOB: 28-Feb-1960  Referred by: [name], MD Reason for referral : High Risk Managed Medicaid ([facility] [employer])   An unsuccessful telephone outreach was attempted today. The patient was referred to the case management team for assistance with care management and care coordination.   Follow Up Plan: The patient has been provided with contact information for the care management team and has been advised to call with any health related questions or concerns.   Mickel Fuchs, BSW, [place] Managed Medicaid Team  [phone]

## 2021-01-02 NOTE — Progress Notes (Signed)
  Subjective:  Patient ID: Russell Collier, male    DOB: 1960/03/11,  MRN: [id number]  Chief Complaint  Patient presents with   Nail Problem    RFC Bilateral nail trim 1-5.     60 y.o. male presents with the above complaint. History confirmed with patient. Requests another injection for the gout still says the joint is hurting.  Objective:  Physical Exam: warm, good capillary refill, no trophic changes or ulcerative lesions, normal DP, absent PT pulses and normal sensory exam.  Right Foot: POP R 1st MPJ, no erythema, no active warmth. Prominent HAV deformity.  Assessment:   1. Gout, arthritis   2. Onychomycosis of multiple toenails with type 2 diabetes mellitus and peripheral angiopathy ([place])    Plan:  Patient was evaluated and treated and all questions answered.  Gouty R 1st MPJ -Repeat injection as below -XR next visit to evaluate osseous structure  Procedure: Joint Injection Location: Right 1st MPJ Skin Prep: Alcohol. Injectate: 0.5 cc 1% lidocaine plain, 0.5 cc betamethasone acetate-betamethasone sodium phosphate Disposition: Patient tolerated procedure well. Injection site dressed with a band-aid.  DM with Onychomycosis, PAD -Nails again debrided x10  Procedure: Nail Debridement Type of Debridement: manual, sharp debridement. Instrumentation: Nail nipper, rotary burr. Number of Nails: 10     No follow-ups on file.

## 2021-01-05 ENCOUNTER — Ambulatory Visit: Payer: Medicaid Other

## 2021-01-05 ENCOUNTER — Other Ambulatory Visit: Payer: Self-pay

## 2021-01-05 VITALS — BP 108/89 | HR 98

## 2021-01-05 DIAGNOSIS — G8929 Other chronic pain: Secondary | ICD-10-CM

## 2021-01-05 DIAGNOSIS — R262 Difficulty in walking, not elsewhere classified: Secondary | ICD-10-CM

## 2021-01-05 DIAGNOSIS — M6281 Muscle weakness (generalized): Secondary | ICD-10-CM

## 2021-01-05 DIAGNOSIS — M5136 Other intervertebral disc degeneration, lumbar region: Secondary | ICD-10-CM | POA: Diagnosis not present

## 2021-01-05 DIAGNOSIS — M545 Low back pain, unspecified: Secondary | ICD-10-CM
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
## 2021-01-06 DIAGNOSIS — M5136 Other intervertebral disc degeneration, lumbar region: Secondary | ICD-10-CM | POA: Diagnosis not present

## 2021-01-07 ENCOUNTER — Encounter: Payer: Self-pay | Admitting: Physical Therapy

## 2021-01-07 ENCOUNTER — Other Ambulatory Visit: Payer: Self-pay | Admitting: *Deleted

## 2021-01-07 ENCOUNTER — Ambulatory Visit: Payer: Medicaid Other | Admitting: Physical Therapy

## 2021-01-07 ENCOUNTER — Other Ambulatory Visit (HOSPITAL_COMMUNITY): Payer: Self-pay

## 2021-01-07 ENCOUNTER — Other Ambulatory Visit: Payer: Self-pay

## 2021-01-07 DIAGNOSIS — M545 Low back pain, unspecified: Secondary | ICD-10-CM

## 2021-01-07 DIAGNOSIS — M6281 Muscle weakness (generalized): Secondary | ICD-10-CM | POA: Diagnosis not present

## 2021-01-07 DIAGNOSIS — G8929 Other chronic pain: Secondary | ICD-10-CM

## 2021-01-07 DIAGNOSIS — R258 Other abnormal involuntary movements: Secondary | ICD-10-CM

## 2021-01-07 DIAGNOSIS — R2689 Other abnormalities of gait and mobility: Secondary | ICD-10-CM

## 2021-01-07 DIAGNOSIS — M5136 Other intervertebral disc degeneration, lumbar region: Secondary | ICD-10-CM | POA: Diagnosis not present

## 2021-01-07 DIAGNOSIS — M25561 Pain in right knee: Secondary | ICD-10-CM

## 2021-01-07 DIAGNOSIS — G629 Polyneuropathy, unspecified: Secondary | ICD-10-CM

## 2021-01-07 DIAGNOSIS — R262 Difficulty in walking, not elsewhere classified: Secondary | ICD-10-CM

## 2021-01-07 DIAGNOSIS — M79661 Pain in right lower leg: Secondary | ICD-10-CM

## 2021-01-07 NOTE — Patient Instructions (Signed)
Aquatics Home Program   Given Handout with pictures for water exercises Pool Written Home Exercise All exercises you will feel a stretch but should be PAIN FREE Be aware of neutral spine/Water immersion requires continuous muscle activation with static positioning. You need to be developing muscle endurance - ability to do work over a longer period of time.  1) sitting hip hinge  Sit in waist deep  water  Bend at hips with chest up( show shirt logo) and look up/ chin down Bend forward as far as possible hold 5 sec  x15  2) standing hip hinge  Stand about a foot from wall Cross arms in front of you like holding groceries and shutting car door with buttocks  Try to touch wall with buttocks  Repeat x15   3)  Runner's stretch. use steps in pool and hold onto rail as needed, place right foot on step and bend right knee as far as you can go. This will stretch your left hip flexors as well.  Hold about 10-15 sec and repeat 5 x on Right leg. Repeat with opposite leg 4) Follow with hamstring stretch on step as shown in pool x 5 and hold 15-30 sec  5) Use ball / floating weights/ noodle to press down in water to increase abdominal engagement while walking through the shallow water  Now, Begin with walking back and forth in water increasing speed to increase strength. Go to pool ledge and perform exercises to warm up Marching in place x 20,  With knee straight kick leg forward and backward 20 x (Leg flex/ext) Remember to keep core quiet and engaged as shown in clinic.  With knee straight kick leg across body(leading with heel) and away from body (to the side and back and return to across your body as shown in aquatic therapy x 20. Remember to keep core quiet and engaged. Standing by pool ledge,(hamstrings curl) bend knee (as if you are kicking your buttock with your heel) x 20 Internal rotatation/External Rotation of Hips standing with Single limb stance on L and R lower extremity in figure 4  pose. Bring knee to midline and then back 10 x each Heel raises x 30 Squat x 20 holding onto pool ledge as deeply as possible Lunge walk across pool Leg circles clockwise and counter clockwise 10 x  Using kick board , submerge in water and rotate torso to engage abdominal muscles as shown in water/aquatics  Russell Collier, PT, [facility] Certified Exercise Expert for the Aging Adult  02/06/20 4:01 PM Phone: [phone] Fax: [phone]

## 2021-01-07 NOTE — Patient Outreach (Signed)
Medicaid Managed Care   Nurse Care Manager Note  01/07/2021 Name:  Russell Collier MRN:  [id number] DOB:  1960/06/11  Russell Collier is an 60 y.o. year old male who is a primary patient of [name], MD.  [facility] Managed Care Coordination team was consulted for assistance with:    HTN DMII Pain  [name] was given information about Medicaid Managed Care Coordination team services today. Russell Collier Patient agreed to services and verbal consent obtained.  Engaged with patient by telephone for follow up visit in response to provider referral for case management and/or care coordination services.   Assessments/Interventions:  Review of past medical history, allergies, medications, health status, including review of consultants reports, laboratory and other test data, was performed as part of comprehensive evaluation and provision of chronic care management services.  SDOH (Social Determinants of Health) assessments and interventions performed:   Care Plan  Allergies  Allergen Reactions   Varenicline Other (See Comments)    Patient reports laryngospasm stopped in the ER   Bupropion Other (See Comments)    Headache - moderate/severe - self discontinued agent     Medications Reviewed Today     Reviewed by Russell Collier, Student-PT (Student-PT) on 01/05/21 at 15  Med List Status: <None>   Medication Order Taking? Sig Documenting Provider Last Dose Status Informant  Accu-Chek Softclix Lancets lancets [id number] No Use as instructed [name], MD Taking Active   albuterol (PROVENTIL) (2.5 MG/3ML) 0.083% nebulizer solution [id number] No Take 3 mLs (2.5 mg total) by nebulization every 6 (six) hours as needed for wheezing or shortness of breath. [name], MD Taking Active   albuterol (VENTOLIN HFA) 108 (90 Base) MCG/ACT inhaler [id number] No Inhale 2 puffs into the lungs every 6 (six) hours as needed for wheezing or shortness of breath. [name], MD Taking  Active   apixaban (ELIQUIS) 2.5 MG TABS tablet [id number] No Take 1 tablet (2.5 mg total) by mouth 2 (two) times daily. [name], MD Taking Active   atorvastatin (LIPITOR) 80 MG tablet [id number] No Take 1 tablet (80 mg total) by mouth daily. [name], MD Taking Active   baclofen (LIORESAL) 10 MG tablet [id number] No Take 0.5-1 tablets (5-10 mg total) by mouth 3 (three) times daily as needed. [name], MD Taking Active   Blood Glucose Monitoring Suppl (ACCU-CHEK GUIDE) w/Device KIT [id number] No 1 Units by Does not apply route daily. [name], MD Taking Active   dapagliflozin propanediol (FARXIGA) 10 MG TABS tablet [id number] No Take 1 tablet (10 mg total) by mouth daily before breakfast. [name], MD Taking Active   enalapril (VASOTEC) 20 MG tablet [id number] No Take 1 tablet (20 mg total) by mouth at bedtime. [name], MD Taking Active   fluticasone [facility]) 50 MCG/ACT nasal spray [id number] No Place 1 spray into both nostrils daily. [name], MD Taking Active   furosemide (LASIX) 40 MG tablet [id number] No Take 1 tablet (40 mg total) by mouth 2 (two) times daily. [name], MD Taking Active   glucose blood (ACCU-CHEK GUIDE) test strip [id number] No Use as instructed [name], MD Taking Active   isosorbide mononitrate (IMDUR) 30 MG 24 hr tablet [id number] No Take 1 tablet (30 mg total) by mouth daily. [name], MD Taking Active   Multiple Vitamin (MULTIVITAMIN WITH MINERALS) TABS tablet [id number] No Take 1 tablet by mouth daily. 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 Collier, Russell Pascal, MD  Active   Tafamidis 61 MG CAPS [id number] No TAKE 1 CAPSULE BY MOUTH DAILY. [name], MD Taking Active   umeclidinium-vilanterol [facility] ELLIPTA) 62.5-25 MCG/INH AEPB [id number] No Inhale 1 puff into the lungs daily as needed. [name], MD Taking Active   valACYclovir (VALTREX) 500 MG tablet [id number] No Take 1 tablet (500 mg total) by mouth daily. 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/2020   COPD exacerbation ([place]) 03/20/2020   Type 2 diabetes mellitus with diabetic peripheral angiopathy without gangrene, without long-term current use of insulin ([place]) 03/20/2020   Coagulation defect ([place]) 12/11/2019   Fatigue 12/10/2019   Leg cramps 12/10/2019   Frequent epistaxis 10/30/2019   Disc degeneration, 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   Ganglion of right knee 09/27/2019   Lumbar radiculopathy 09/27/2019   Lower extremity edema    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[place])    Acute respiratory failure ([place]) 07/27/2019   Primary osteoarthritis of right knee 07/24/2019   Breast tenderness in male 05/31/2019   Chronic pain 05/31/2019   AKI (acute kidney injury) ([place]) 05/31/2019   Pain due to onychomycosis of toenails of both feet 05/16/2019   Chronic knee pain 04/16/2019   Diabetes ([place]) 04/16/2019   Esophageal reflux 02/06/2019   Heartburn 02/06/2019   Chronic skin ulcer of lower leg ([place]) 12/21/2018   S/P drug eluting coronary stent placement    Coronary artery disease involving native coronary artery of native heart with unstable angina pectoris ([place]) 12/20/2018   Unstable angina (HCC)    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   Acute on chronic diastolic heart failure (HCC)    Shortness of breath     Precordial chest pain    Idiopathic peripheral neuropathy    Palpitations    Chronic diastolic heart failure (HCC)    Abnormal ankle brachial index (ABI)    Chest pain, rule out acute myocardial infarction 09/26/2018   History of cocaine abuse ([place]) 08/20/2018   Marijuana use 08/20/2018   Morbid obesity with BMI of 40.0-44.9, adult ([place]) 08/20/2018   Dyspepsia    Morbid obesity (HCC)    OSA on CPAP    Chest pain 05/30/2018   CAD (coronary artery disease) 03/30/2018   Elevated troponin 02/03/2018   Positive urine drug screen 02/03/2018   Tobacco abuse 02/03/2018   Hyperlipidemia 02/03/2018   Acute respiratory failure with hypoxia ([place]) 02/02/2018   Acute congestive heart failure (HCC)    Keratoconjunctivitis sicca of left eye not specified as Sjogren's 09/06/2017   Long term current use of oral hypoglycemic drug 09/06/2017   Mechanical ectropion of left lower eyelid 09/06/2017   Nuclear sclerotic cataract of both eyes 09/06/2017   Refractive amblyopia, left 09/06/2017   Type 2 diabetes mellitus without complication, without long-term current use of insulin ([place]) 09/06/2017   Hyperopia of both eyes with astigmatism and presbyopia 09/06/2017   Atypical  chest pain 12/27/2015   Essential hypertension    Neuropathy     Conditions to be addressed/monitored per PCP order:  HTN, DMII, and pain  Care Plan : General Plan of Care (Adult)  Updates made by [name], RN since 01/07/2021 12:00 AM  Completed 01/07/2021   Problem: Health Promotion or Disease Self-Management (General Plan of Care) Resolved 01/07/2021     Long-Range Goal: Self-Management Plan Developed Completed 01/07/2021  Start Date: 07/11/2020  Expected End Date: 12/30/2020  Recent Progress: On track  Priority: Medium  Note:   Current Barriers:  Ineffective Self Health Maintenance-Mr. Stickels is managing DM, HTN and pain after recent back surgery. He is improving his health maintenance by attending appointments,  taking his medications and eating a healthy diet. Last BP 108/89, A1C 6.0, his mobility has increased and pain decreased since starting therapy. Patient would like to continue receiving follow up visits with [facility] Lacks social connections Unable to perform ADLs independently Does not contact provider office for questions/concerns Currently UNABLE TO independently self manage needs related to chronic health conditions.  Knowledge Deficits related to short term plan for care coordination needs and long term plans for chronic disease management needs Nurse Case Manager Clinical Goal(s):  patient will work with care management team to address care coordination and chronic disease management needs related to knee pain and recent back surgery-Met   Interventions:  Evaluation of current treatment plan related to knee pain and patient's adherence to plan as established by provider. Provided education to patient re: health maintenance Discussed plans with patient for ongoing care management follow up and provided patient with direct contact information for care management team Reviewed upcoming appointments: PCP 11/28 and ENT 12/8 Discussed dermatology referral: Provider retiring, he will discuss needing a new referral with PCP on 11/28 Advised patient to continue working with "Laurey Arrow" for smoking cessation, encouragement provided Self Care Activities:  Patient will self administer medications as prescribed Patient will attend all scheduled provider appointments Patient will call pharmacy for medication refills Patient will call provider office for new concerns or questions Patient Goals: - use saline spray to moisten nasal membranes and humidifier in your home - follow up with any referrals and attend appointment on 11/28 with PCP - Call ([phone]) or go online [email or web address] for [name] and [name] vouchers offered by  [employer] - schedule recommended health tests (blood work, mammogram, colonoscopy, pap test) - attend all scheduled appointments - work on maintaining a diabetic diet, compliant with gout restrictions - work on smoking cessation, call 1-800-QUIT-NOW or contact [facility] [place] [phone] for assistance - work with Pharmacist, Janeann Forehand for assistance with smoking cessation Follow Up Plan: Telephone follow up appointment with care management team member scheduled for:02/04/21 @ 10:30am      Follow Up:  Patient agrees to Care Plan and Follow-up.  Plan: The Managed Medicaid care management team will reach out to the patient again over the next 30 days.  Date/time of next scheduled RN care management/care coordination outreach:  02/04/21 @ 10:30am  [name] RN, BSN [place] RN Care Coordinator
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
## 2021-01-07 NOTE — Patient Instructions (Signed)
Visit Information  Mr. Rutledge was given information about Medicaid Managed Care team care coordination services as a part of their [place] Medicaid benefit. [name] verbally consented to engagement with the [facility] Managed Care team.   If you are experiencing a medical emergency, please call 911 or report to your local emergency department or urgent care.   If you have a non-emergency medical problem during routine business hours, please contact your provider's office and ask to speak with a nurse.   For questions related to your [facility], please call: [phone] or visit the homepage here: [email or web address]  If you would like to schedule transportation through your [facility], please call the following number at least 2 days in advance of your appointment: [phone].   Call the [place] at [phone], at any time, 24 hours a day, 7 days a week. If you are in danger or need immediate medical attention call 911.  If you would like help to quit smoking, call 1-800-QUIT-NOW 480-409-4767) OR Espaol: 1-855-Djelo-Ya (6-812-751-7001) o para ms informacin haga clic aqu or Text READY to 200-400 to register via text  [name] - following are the goals we discussed in your visit today:   Goals Addressed             This Visit's Progress    COMPLETED: Protect My Health       Timeframe:  Long-Range Goal Priority:  Medium Start Date:    07/11/20                         Expected End Date:   12/30/20                  Follow Up Date 12/30/20   - use saline spray to moisten nasal membranes and humidifier in your home - follow up with any referrals and attend appointment on 11/1 with PCP - Follow up with PCP with any health related concerns or questions - Call ([phone]) or go online  [email or web address] for [name] and [name] vouchers offered by [employer] - schedule recommended health tests (blood work, mammogram, colonoscopy, pap test) - attend all scheduled appointments - work on maintaining a diabetic diet, compliant with gout restrictions - work on smoking cessation, call 1-800-QUIT-NOW or contact [place] [phone] for assistance - work with Pharmacist, Janeann Forehand for assistance with smoking cessation    Why is this important?   Screening tests can find diseases early when they are easier to treat.  Your doctor or nurse will talk with you about which tests are important for you.  Getting shots for common diseases like the flu and shingles will help prevent them.             Please see education materials related to health maintenance provided as print materials.   The patient verbalized understanding of instructions provided today and agreed to receive a mailed copy of patient instruction and/or educational materials.  Telephone follow up appointment with Managed Medicaid care management team member scheduled for:02/04/21 @ 10:30am  [name] RN, BSN [place] RN Care Coordinator   Following is a copy of your plan of care:  Care Plan : General Plan of Care (Adult)  Updates made by [name], RN since 01/07/2021 12:00 AM  Completed 01/07/2021   Problem: Health Promotion or Disease Self-Management (General Plan of Care) Resolved  01/07/2021     Long-Range Goal: Self-Management Plan Developed Completed 01/07/2021  Start Date: 07/11/2020  Expected End Date: 12/30/2020  Recent Progress: On track  Priority: Medium  Note:   Current Barriers:  Ineffective Self Health Maintenance-Mr. Moten is managing DM, HTN and pain after recent back surgery. He is improving his health maintenance by attending appointments, taking his medications and  eating a healthy diet. Last BP 108/89, A1C 6.0, his mobility has increased and pain decreased since starting therapy. Patient would like to continue receiving follow up visits with [facility] Lacks social connections Unable to perform ADLs independently Does not contact provider office for questions/concerns Currently UNABLE TO independently self manage needs related to chronic health conditions.  Knowledge Deficits related to short term plan for care coordination needs and long term plans for chronic disease management needs Nurse Case Manager Clinical Goal(s):  patient will work with care management team to address care coordination and chronic disease management needs related to knee pain and recent back surgery-Met   Interventions:  Evaluation of current treatment plan related to knee pain and patient's adherence to plan as established by provider. Provided education to patient re: health maintenance Discussed plans with patient for ongoing care management follow up and provided patient with direct contact information for care management team Reviewed upcoming appointments: PCP 11/28 and ENT 12/8 Discussed dermatology referral: Provider retiring, he will discuss needing a new referral with PCP on 11/28 Advised patient to continue working with "Laurey Arrow" for smoking cessation, encouragement provided Self Care Activities:  Patient will self administer medications as prescribed Patient will attend all scheduled provider appointments Patient will call pharmacy for medication refills Patient will call provider office for new concerns or questions Patient Goals: - use saline spray to moisten nasal membranes and humidifier in your home - follow up with any referrals and attend appointment on 11/28 with PCP - Call ([phone]) or go online [email or web address] for [name] and [name] vouchers offered by [employer] - schedule  recommended health tests (blood work, mammogram, colonoscopy, pap test) - attend all scheduled appointments - work on maintaining a diabetic diet, compliant with gout restrictions - work on smoking cessation, call 1-800-QUIT-NOW or contact [place] [phone] for assistance - work with Pharmacist, Janeann Forehand for assistance with smoking cessation Follow Up Plan: Telephone follow up appointment with care management team member scheduled for:02/04/21 @ 10:30am

## 2021-01-08 DIAGNOSIS — M5136 Other intervertebral disc degeneration, lumbar region: Secondary | ICD-10-CM | POA: Diagnosis not present

## 2021-01-09 DIAGNOSIS — M5136 Other intervertebral disc degeneration, lumbar region: Secondary | ICD-10-CM | POA: Diagnosis not present

## 2021-01-12 ENCOUNTER — Ambulatory Visit: Payer: Medicaid Other

## 2021-01-12 DIAGNOSIS — M5136 Other intervertebral disc degeneration, lumbar region: Secondary | ICD-10-CM | POA: Diagnosis not present

## 2021-01-13 DIAGNOSIS — M5136 Other intervertebral disc degeneration, lumbar region: Secondary | ICD-10-CM | POA: Diagnosis not present

## 2021-01-14 ENCOUNTER — Ambulatory Visit: Payer: Medicaid Other | Admitting: Physical Therapy

## 2021-01-14 DIAGNOSIS — M5136 Other intervertebral disc degeneration, lumbar region: Secondary | ICD-10-CM | POA: Diagnosis not present

## 2021-01-15 DIAGNOSIS — M5136 Other intervertebral disc degeneration, lumbar region: Secondary | ICD-10-CM | POA: Diagnosis not present

## 2021-01-16 DIAGNOSIS — M5136 Other intervertebral disc degeneration, lumbar region: Secondary | ICD-10-CM | POA: Diagnosis not present

## 2021-01-19 ENCOUNTER — Telehealth: Payer: Self-pay | Admitting: Pharmacist

## 2021-01-19 ENCOUNTER — Ambulatory Visit (INDEPENDENT_AMBULATORY_CARE_PROVIDER_SITE_OTHER): Payer: Medicaid Other | Admitting: Family Medicine

## 2021-01-19 ENCOUNTER — Ambulatory Visit (INDEPENDENT_AMBULATORY_CARE_PROVIDER_SITE_OTHER): Payer: Medicaid Other

## 2021-01-19 ENCOUNTER — Encounter: Payer: Self-pay | Admitting: Family Medicine

## 2021-01-19 ENCOUNTER — Other Ambulatory Visit: Payer: Self-pay

## 2021-01-19 ENCOUNTER — Ambulatory Visit: Payer: Medicaid Other | Admitting: Family Medicine

## 2021-01-19 VITALS — BP 123/62 | HR 74 | Ht 71.0 in | Wt 313.4 lb

## 2021-01-19 DIAGNOSIS — Z23 Encounter for immunization: Secondary | ICD-10-CM

## 2021-01-19 DIAGNOSIS — R7989 Other specified abnormal findings of blood chemistry: Secondary | ICD-10-CM | POA: Diagnosis not present

## 2021-01-19 DIAGNOSIS — M5136 Other intervertebral disc degeneration, lumbar region: Secondary | ICD-10-CM | POA: Diagnosis not present

## 2021-01-19 MED ORDER — NICOTINE 21 MG/24HR TD PT24: 21.0000 mg | MEDICATED_PATCH | Freq: Every day | TRANSDERMAL | 2 refills | Status: DC

## 2021-01-19 NOTE — Telephone Encounter (Signed)
Patient contacted for follow/up of tobacco intake reduction attempt.   Since last contact patient reports continuing to smoke ~ 10 per day.    Medications currently being used; None - however, requested refill of  Nicotine patch - 21mg    Most common triggers to use tobacco include; Habit, stress   I agreed to supply nicotine 21mg  patches to assist with goal of reducing to less than 10 cigarettes per day.   Total time with patient call and documentation of interaction: 13 minutes.  F/U Phone call planned: 2 weeks.

## 2021-01-19 NOTE — Progress Notes (Signed)
    SUBJECTIVE:   CHIEF COMPLAINT / HPI:   Here for follow-up elevated creatinine.  He was previously using a lot of ibuprofen (up to 2000 mg/day) following his back surgery but now has been using them sparingly.  He was evaluated at last visit for dermoid cyst of his nose which he states has been present for 15 years.  He never heard back regarding referral.  He is amenable to going to dermatology clinic here at [facility].  He has ENT appointment scheduled next month for frequent epistaxis.  PERTINENT  PMH / PSH: HTN, CAD, CHF, T2DM  OBJECTIVE:   BP 123/62   Pulse 74   Ht 5\' 11"  (1.803 m)   Wt (!) 313 lb 6.4 oz (142.2 kg)   SpO2 98%   BMI 43.71 kg/m   General: Obese, NAD CV: RRR, no murmurs Pulm: CTAB, no wheezes or rales Derm: cyst on left side of nose  ASSESSMENT/PLAN:   Elevated serum creatinine Unclear if AKI versus CKD/new baseline.  Creatinine values in the past year have been elevated.  He has decreased his NSAID use.  He is already on ACE inhibitor and SGLT2 inhibitor for renal protection. - BMP today   Dermoid cyst - patient to make appt at front desk with [facility] derm clinic  HCM - Covid bivalent booster given  Zola Button, MD [place]

## 2021-01-19 NOTE — Telephone Encounter (Signed)
-----   Message from Leavy Cella, [place] sent at 01/01/2021  2:15 PM EST ----- Regarding: Tobacco Intake reductin - goal of 8 or fewer per day.

## 2021-01-19 NOTE — Patient Instructions (Addendum)
It was nice seeing you today!  Re-checking kidney function with blood tests today. You will receive a letter in the mail with results unless anything urgent comes up.  Schedule appointment with dermatology clinic here on your way out.  Please arrive at least 15 minutes prior to your scheduled appointments.  Stay well, Zola Button, MD [place] [phone]

## 2021-01-19 NOTE — Telephone Encounter (Signed)
Noted and agree. 

## 2021-01-20 ENCOUNTER — Encounter: Payer: Self-pay | Admitting: Family Medicine

## 2021-01-20 DIAGNOSIS — M5136 Other intervertebral disc degeneration, lumbar region: Secondary | ICD-10-CM | POA: Diagnosis not present

## 2021-01-20 LAB — BASIC METABOLIC PANEL
BUN/Creatinine Ratio: 13 (ref 10–24)
BUN: 18 mg/dL (ref 8–27)
CO2: 25 mmol/L (ref 20–29)
Calcium: 9.4 mg/dL (ref 8.6–10.2)
Chloride: 101 mmol/L (ref 96–106)
Creatinine, Ser: 1.44 mg/dL — ABNORMAL HIGH (ref 0.76–1.27)
Glucose: 109 mg/dL — ABNORMAL HIGH (ref 70–99)
Potassium: 4 mmol/L (ref 3.5–5.2)
Sodium: 143 mmol/L (ref 134–144)
eGFR: 55 mL/min/{1.73_m2} — ABNORMAL LOW (ref >59)

## 2021-01-21 DIAGNOSIS — M5136 Other intervertebral disc degeneration, lumbar region: Secondary | ICD-10-CM | POA: Diagnosis not present

## 2021-01-22 DIAGNOSIS — M5136 Other intervertebral disc degeneration, lumbar region: Secondary | ICD-10-CM | POA: Diagnosis not present

## 2021-01-23 DIAGNOSIS — H52223 Regular astigmatism, bilateral: Secondary | ICD-10-CM | POA: Diagnosis not present

## 2021-01-23 DIAGNOSIS — H524 Presbyopia: Secondary | ICD-10-CM | POA: Diagnosis not present

## 2021-01-23 DIAGNOSIS — M5136 Other intervertebral disc degeneration, lumbar region: Secondary | ICD-10-CM | POA: Diagnosis not present

## 2021-01-26 DIAGNOSIS — M5136 Other intervertebral disc degeneration, lumbar region: Secondary | ICD-10-CM | POA: Diagnosis not present

## 2021-01-27 ENCOUNTER — Other Ambulatory Visit: Payer: Self-pay

## 2021-01-27 ENCOUNTER — Ambulatory Visit: Payer: Medicaid Other | Attending: Orthopaedic Surgery

## 2021-01-27 DIAGNOSIS — M6281 Muscle weakness (generalized): Secondary | ICD-10-CM | POA: Insufficient documentation

## 2021-01-27 DIAGNOSIS — M545 Low back pain, unspecified: Secondary | ICD-10-CM | POA: Diagnosis present

## 2021-01-27 DIAGNOSIS — G8929 Other chronic pain: Secondary | ICD-10-CM | POA: Insufficient documentation

## 2021-01-27 DIAGNOSIS — M5136 Other intervertebral disc degeneration, lumbar region: Secondary | ICD-10-CM | POA: Diagnosis not present
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
## 2021-01-28 DIAGNOSIS — M5136 Other intervertebral disc degeneration, lumbar region: Secondary | ICD-10-CM | POA: Diagnosis not present

## 2021-01-29 DIAGNOSIS — Z7901 Long term (current) use of anticoagulants: Secondary | ICD-10-CM | POA: Insufficient documentation

## 2021-01-29 DIAGNOSIS — M5136 Other intervertebral disc degeneration, lumbar region: Secondary | ICD-10-CM | POA: Diagnosis not present

## 2021-01-30 DIAGNOSIS — M5136 Other intervertebral disc degeneration, lumbar region: Secondary | ICD-10-CM | POA: Diagnosis not present

## 2021-02-02 ENCOUNTER — Other Ambulatory Visit (HOSPITAL_COMMUNITY): Payer: Self-pay

## 2021-02-02 NOTE — Progress Notes (Signed)
    SUBJECTIVE:   CHIEF COMPLAINT / HPI:   Reduced sensation of right foot: 60 year old male presenting for reduced sensation of his right foot which started last Monday. Recently saw podiatry on 01/02/2021 with normal sensory exam of the bilateral feet.  Patient was also noted to have decreased sensation in the plantar aspect of the right foot on diabetic foot exam back in July of this year.  Most recent A1c of 6.2 last month.  PERTINENT  PMH / PSH: T2DM  OBJECTIVE:   BP (!) 142/104   Pulse 85   Ht 5\' 11"  (1.803 m)   Wt (!) 311 lb (141.1 kg)   SpO2 100%   BMI 43.38 kg/m    General: NAD, pleasant, able to participate in exam Cardiac: RRR, no murmurs. Respiratory: CTAB, normal effort, No wheezes, rales or rhonchi Extremities: no edema or cyanosis. MSK: Right calf with swelling and pain versus left, patient has poor sensation noted in the dorsum and plantar aspects of the right foot.  Difficult to palpate his peripheral pulses in bilateral lower extremities.  He is unable to move the digits of the right foot other than minor twitching which he endorses is due to them "feeling as if they are asleep".  He has 5/5 strength in upper extremities bilaterally, 5/5 strength in knee extension bilaterally, 4/5 strength in ankle dorsiflexion/plantarflexion on the right versus left Neuro: alert, CN II through XII intact, fine touch sensation intact in upper and lower extremities other than the right foot as documented above Psych: Normal affect and mood  ASSESSMENT/PLAN:    Right Lower extremity weakness/numbness/pain 60 year old male with numbness and weakness of his right foot.  He is not experiencing any other symptoms suggestive of a stroke but this did occur about 2 weeks ago after ambulating.  He does have swelling and tightness of that right calf versus the left.  He does take Eliquis and endorses missing no doses.  Differential is broad and can include paresthesias/neuropathy due to B12  deficiency, folate deficiency, syphilis, versus cerebral insult such as a stroke though this would be a weird presentation to only have numbness and weakness in the toes and foot on the right with no other symptoms.  Peripheral pulses were difficult to palpate and so ABI was performed in the office which read as severe PAD. Patient endorses worsening burning of his right foot not present when he first entered the office. Concern for DVT is also present with swelling and pain of right calf. Discussed case with Dr. Gwendlyn Deutscher which recommended ED evaluation.   Lurline Del, [place]

## 2021-02-03 ENCOUNTER — Emergency Department (HOSPITAL_BASED_OUTPATIENT_CLINIC_OR_DEPARTMENT_OTHER): Admit: 2021-02-03 | Discharge: 2021-02-03 | Disposition: A | Discharge status: Home / Self Care | Payer: Medicaid Other

## 2021-02-03 ENCOUNTER — Encounter (HOSPITAL_COMMUNITY): Payer: Self-pay | Admitting: Emergency Medicine

## 2021-02-03 ENCOUNTER — Emergency Department (HOSPITAL_COMMUNITY)
Admission: EM | Admit: 2021-02-03 | Discharge: 2021-02-04 | Disposition: A | Discharge status: Home / Self Care | Payer: Medicaid Other | Attending: Emergency Medicine | Admitting: Emergency Medicine

## 2021-02-03 ENCOUNTER — Other Ambulatory Visit: Payer: Self-pay

## 2021-02-03 ENCOUNTER — Encounter: Payer: Self-pay | Admitting: Family Medicine

## 2021-02-03 ENCOUNTER — Ambulatory Visit (INDEPENDENT_AMBULATORY_CARE_PROVIDER_SITE_OTHER): Payer: Medicaid Other | Admitting: Family Medicine

## 2021-02-03 VITALS — BP 142/104 | HR 85 | Ht 71.0 in | Wt 311.0 lb

## 2021-02-03 DIAGNOSIS — Z7984 Long term (current) use of oral hypoglycemic drugs: Secondary | ICD-10-CM | POA: Diagnosis not present

## 2021-02-03 DIAGNOSIS — R202 Paresthesia of skin: Secondary | ICD-10-CM | POA: Diagnosis not present

## 2021-02-03 DIAGNOSIS — F17210 Nicotine dependence, cigarettes, uncomplicated: Secondary | ICD-10-CM | POA: Diagnosis not present

## 2021-02-03 DIAGNOSIS — Z955 Presence of coronary angioplasty implant and graft: Secondary | ICD-10-CM | POA: Diagnosis not present

## 2021-02-03 DIAGNOSIS — Z7901 Long term (current) use of anticoagulants: Secondary | ICD-10-CM | POA: Diagnosis not present

## 2021-02-03 DIAGNOSIS — R531 Weakness: Secondary | ICD-10-CM | POA: Diagnosis not present

## 2021-02-03 DIAGNOSIS — M79604 Pain in right leg: Secondary | ICD-10-CM

## 2021-02-03 DIAGNOSIS — R200 Anesthesia of skin: Secondary | ICD-10-CM

## 2021-02-03 DIAGNOSIS — Z79899 Other long term (current) drug therapy: Secondary | ICD-10-CM | POA: Diagnosis not present

## 2021-02-03 DIAGNOSIS — I25110 Atherosclerotic heart disease of native coronary artery with unstable angina pectoris: Secondary | ICD-10-CM | POA: Diagnosis not present

## 2021-02-03 DIAGNOSIS — I110 Hypertensive heart disease with heart failure: Secondary | ICD-10-CM | POA: Diagnosis not present

## 2021-02-03 DIAGNOSIS — J449 Chronic obstructive pulmonary disease, unspecified: Secondary | ICD-10-CM | POA: Insufficient documentation

## 2021-02-03 DIAGNOSIS — M79671 Pain in right foot: Secondary | ICD-10-CM | POA: Diagnosis not present

## 2021-02-03 DIAGNOSIS — R29898 Other symptoms and signs involving the musculoskeletal system: Secondary | ICD-10-CM

## 2021-02-03 DIAGNOSIS — E1151 Type 2 diabetes mellitus with diabetic peripheral angiopathy without gangrene: Secondary | ICD-10-CM | POA: Diagnosis not present

## 2021-02-03 DIAGNOSIS — I5033 Acute on chronic diastolic (congestive) heart failure: Secondary | ICD-10-CM | POA: Insufficient documentation

## 2021-02-03 DIAGNOSIS — M5136 Other intervertebral disc degeneration, lumbar region: Secondary | ICD-10-CM | POA: Diagnosis not present

## 2021-02-03 LAB — BASIC METABOLIC PANEL
Anion gap: 9 (ref 5–15)
BUN: 25 mg/dL — ABNORMAL HIGH (ref 8–23)
CO2: 27 mmol/L (ref 22–32)
Calcium: 8.9 mg/dL (ref 8.9–10.3)
Chloride: 102 mmol/L (ref 98–111)
Creatinine, Ser: 1.51 mg/dL — ABNORMAL HIGH (ref 0.61–1.24)
GFR, Estimated: 52 mL/min — ABNORMAL LOW (ref >60)
Glucose, Bld: 145 mg/dL — ABNORMAL HIGH (ref 70–99)
Potassium: 3.9 mmol/L (ref 3.5–5.1)
Sodium: 138 mmol/L (ref 135–145)

## 2021-02-03 LAB — CBC
HCT: 42 % (ref 39.0–52.0)
Hemoglobin: 14 g/dL (ref 13.0–17.0)
MCH: 30 pg (ref 26.0–34.0)
MCHC: 33.3 g/dL (ref 30.0–36.0)
MCV: 90.1 fL (ref 80.0–100.0)
Platelets: 262 10*3/uL (ref 150–400)
RBC: 4.66 MIL/uL (ref 4.22–5.81)
RDW: 15.9 % — ABNORMAL HIGH (ref 11.5–15.5)
WBC: 9.3 10*3/uL (ref 4.0–10.5)
nRBC: 0 % (ref 0.0–0.2)

## 2021-02-03 NOTE — ED Provider Notes (Signed)
Emergency Medicine Provider Triage Evaluation Note  Russell Collier , a 60 y.o. male  was evaluated in triage.  Pt complains of pain, numbness, and swelling of right lower extremity.  Patient sent to emergency room from PCP office for evaluation for DVT.  Denies shortness of breath, chest pain.  Review of Systems  Positive: As above Negative: As above  Physical Exam  BP (!) 146/64    Pulse 89    Temp 98.4 F (36.9 C) (Oral)    Resp (!) 22    SpO2 97%  Gen:   Awake, no distress   Resp:  Normal effort  MSK:   Moves extremities without difficulty  Other:    Medical Decision Making  Medically screening exam initiated at 6:52 PM.  Appropriate orders placed.  [name] was informed that the remainder of the evaluation will be completed by another provider, this initial triage assessment does not replace that evaluation, and the importance of remaining in the ED until their evaluation is complete.     [name], PA-C 02/03/21 1853    [name], MD 02/06/21 [phone]

## 2021-02-03 NOTE — ED Triage Notes (Signed)
Patient sent from [facility] for possible DVT of right foot. Patient having numbness and weakness to right foot x week. Patient ambulatory with rolling walker. Reports pain in right calf as well.

## 2021-02-03 NOTE — Progress Notes (Signed)
Right lower extremity venous duplex has been completed. Preliminary results can be found in CV Proc through chart review.  Results were given to [name] PA.  02/03/21 7:17 PM Russell Collier RVT

## 2021-02-04 ENCOUNTER — Emergency Department (HOSPITAL_COMMUNITY): Payer: Medicaid Other

## 2021-02-04 ENCOUNTER — Other Ambulatory Visit: Payer: Self-pay | Admitting: *Deleted

## 2021-02-04 DIAGNOSIS — M5136 Other intervertebral disc degeneration, lumbar region: Secondary | ICD-10-CM | POA: Diagnosis not present

## 2021-02-04 MED ORDER — PREDNISONE 20 MG PO TABS
20.0000 mg | ORAL_TABLET | Freq: Once | ORAL | Status: AC
  Administered 2021-02-04: 05:00:00 20 mg via ORAL
  Filled 2021-02-04: qty 1

## 2021-02-04 MED ORDER — PREDNISONE 10 MG PO TABS: 20.0000 mg | ORAL_TABLET | Freq: Two times a day (BID) | ORAL | 0 refills | Status: DC

## 2021-02-04 NOTE — Discharge Instructions (Signed)
Begin taking prednisone as prescribed today.  Rest.  Follow-up with your primary doctor/spine surgeon if symptoms are not improving in the next week.

## 2021-02-04 NOTE — Patient Instructions (Signed)
Visit Information  [name]  - as a part of your Medicaid benefit, you are eligible for care management and care coordination services at no cost or copay. I was unable to reach you by phone today but would be happy to help you with your health related needs. Please feel free to call me @ [phone].   A member of the Managed Medicaid care management team will reach out to you again over the next 7 days on 02/05/21 @ 2:30pm.  [name] RN, [place] RN Care Coordinator

## 2021-02-04 NOTE — ED Notes (Signed)
Patient verbalizes understanding of d/c instructions. Opportunities for questions and answers were provided. Pt d/c from ED and ambulated with walker to lobby.

## 2021-02-04 NOTE — Patient Outreach (Signed)
Care Coordination  02/04/2021  [name] 04-11-1960 [id number]   Medicaid Managed Care   Unsuccessful Outreach Note  02/04/2021 Name: [name] MRN: [id number] DOB: October 03, 1960  Referred by: [name], MD Reason for referral : High Risk Managed Medicaid (Unsuccessful RNCM follow up outreach)   An unsuccessful telephone outreach was attempted today. The patient was referred to the case management team for assistance with care management and care coordination.   Follow Up Plan: A HIPAA compliant phone message was left for the patient providing contact information and requesting a return call.   [name] RN, BSN [place] RN Care Coordinator
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
## 2021-02-04 NOTE — ED Notes (Signed)
Patient transported to MRI 

## 2021-02-05 ENCOUNTER — Telehealth: Payer: Self-pay

## 2021-02-05 ENCOUNTER — Other Ambulatory Visit: Payer: Self-pay | Admitting: *Deleted

## 2021-02-05 ENCOUNTER — Other Ambulatory Visit: Payer: Self-pay

## 2021-02-05 DIAGNOSIS — M5136 Other intervertebral disc degeneration, lumbar region: Secondary | ICD-10-CM | POA: Diagnosis not present

## 2021-02-05 NOTE — Patient Instructions (Signed)
Visit Information  Mr. Rogus was given information about Medicaid Managed Care team care coordination services as a part of their [place] Medicaid benefit. [name] verbally consented to engagement with the [facility] Managed Care team.   If you are experiencing a medical emergency, please call 911 or report to your local emergency department or urgent care.   If you have a non-emergency medical problem during routine business hours, please contact your provider's office and ask to speak with a nurse.   For questions related to your [facility], please call: [phone] or visit the homepage here: [email or web address]  If you would like to schedule transportation through your [facility], please call the following number at least 2 days in advance of your appointment: [phone].   Call the [place] at [phone], at any time, 24 hours a day, 7 days a week. If you are in danger or need immediate medical attention call 911.  If you would like help to quit smoking, call 1-800-QUIT-NOW 9081084969) OR Espaol: 1-855-Djelo-Ya (7-209-470-9628) o para ms informacin haga clic aqu or Text READY to 200-400 to register via text   Please see education materials related to pain management and fall prevention provided as print materials.   The patient verbalized understanding of instructions provided today and agreed to receive a mailed copy of patient instruction and/or educational materials.  Telephone follow up appointment with Managed Medicaid care management team member scheduled for:02/19/21 @ 2:30pm  [name] RN, BSN [place] RN Care Coordinator   Following is a copy of your plan of care:  Care Plan : RN Care Manager Plan of Care  Updates made by [name], RN since 02/05/2021 12:00 AM      Problem: Health Management Needs Related to Pain      Long-Range Goal: Development of Plan of Care to address health management needs related to pain   Start Date: 02/05/2021  Expected End Date: 04/06/2021  Priority: High  Note:   Current Barriers:  Knowledge Deficits related to plan of care for management of new right foot pain and numbness   RNCM Clinical Goal(s):  Patient will verbalize understanding of plan for management of pain as evidenced by patient verbalization of self monitoring activities take all medications exactly as prescribed and will call provider for medication related questions as evidenced by documentation in EMR    attend all scheduled medical appointments: 12/20 at [facility] and 12/22 at [facility] for dermatology as evidenced by provider documentation in EMR        through collaboration with RN Care manager, provider, and care team.   Interventions: Inter-disciplinary care team collaboration (see longitudinal plan of care) Evaluation of current treatment plan related to  self management and patient's adherence to plan as established by provider   Pain:  (Status: New goal.) Long Term Goal  Pain assessment performed Medications reviewed-discussed and advised to take correct dose of prednisone Reviewed provider established plan for pain management; Discussed importance of adherence to all scheduled medical appointments; Counseled on the importance of reporting any/all new or changed pain symptoms or management strategies to pain management provider; Advised patient to report to care team affect of pain on daily activities; Reviewed with patient prescribed pharmacological and nonpharmacological pain relief strategies; Advised patient to report new right foot pain and numbness to Dr. Marcina Millard Surgeon)  Patient Goals/Self-Care Activities: Take medications as prescribed   Attend all  scheduled provider appointments Call pharmacy for medication refills 3-7 days in  advance of running out of medications Call provider office for new concerns or questions

## 2021-02-05 NOTE — Telephone Encounter (Signed)
Transition Care Management Follow-up Telephone Call Date of discharge and from where: 02/04/2021 from [facility] How have you been since you were released from the hospital? Pt stated that the medication is helping.  Any questions or concerns? No  Items Reviewed: Did the pt receive and understand the discharge instructions provided? Yes  Medications obtained and verified? Yes  Other? No  Any new allergies since your discharge? No  Dietary orders reviewed? No Do you have support at home? Yes   Functional Questionnaire: (I = Independent and D = Dependent) ADLs: I  Bathing/Dressing- I  Meal Prep- I  Eating- I  Maintaining continence- I  Transferring/Ambulation- I  Managing Meds- I   Follow up appointments reviewed:  PCP Hospital f/u appt confirmed? No   Specialist Hospital f/u appt confirmed? No   Are transportation arrangements needed? No  If their condition worsens, is the pt aware to call PCP or go to the Emergency Dept.? Yes Was the patient provided with contact information for the PCP's office or ED? Yes Was to pt encouraged to call back with questions or concerns? Yes

## 2021-02-05 NOTE — Patient Outreach (Signed)
Medicaid Managed Care   Nurse Care Manager Note  02/05/2021 Name:  Russell Collier MRN:  [id number] DOB:  04-01-1960  Russell Collier is an 60 y.o. year old male who is a primary patient of [name], MD.  [facility] Managed Care Coordination team was consulted for assistance with:    pain  Russell Collier was given information about Medicaid Managed Care Coordination team services today. Russell Collier Patient agreed to services and verbal consent obtained.  Engaged with patient by telephone for follow up visit in response to provider referral for case management and/or care coordination services.   Assessments/Interventions:  Review of past medical history, allergies, medications, health status, including review of consultants reports, laboratory and other test data, was performed as part of comprehensive evaluation and provision of chronic care management services.  SDOH (Social Determinants of Health) assessments and interventions performed: SDOH Interventions    Flowsheet Row Most Recent Value  SDOH Interventions   Housing Interventions Intervention Not Indicated       Care Plan  Allergies  Allergen Reactions   Varenicline Other (See Comments)    Patient reports laryngospasm stopped in the ER   Bupropion Other (See Comments)    Headache - moderate/severe - self discontinued agent     Medications Reviewed Today     Reviewed by Russell Collier, PT (Physical Therapist) on 01/27/21 at 1521  Med List Status: <None>   Medication Order Taking? Sig Documenting Provider Last Dose Status Informant  Accu-Chek Softclix Lancets lancets [id number] No Use as instructed [name], MD Taking Active   albuterol (PROVENTIL) (2.5 MG/3ML) 0.083% nebulizer solution [id number] No Take 3 mLs (2.5 mg total) by nebulization every 6 (six) hours as needed for wheezing or shortness of breath. [name], MD Taking Active   albuterol (VENTOLIN HFA) 108 (90 Base) MCG/ACT inhaler [id number]  No Inhale 2 puffs into the lungs every 6 (six) hours as needed for wheezing or shortness of breath. [name], MD Taking Active   apixaban (ELIQUIS) 2.5 MG TABS tablet [id number] No Take 1 tablet (2.5 mg total) by mouth 2 (two) times daily. [name], MD Taking Active   atorvastatin (LIPITOR) 80 MG tablet [id number] No Take 1 tablet (80 mg total) by mouth daily. [name], MD Taking Active   baclofen (LIORESAL) 10 MG tablet [id number] No Take 0.5-1 tablets (5-10 mg total) by mouth 3 (three) times daily as needed. [name], MD Taking Active   Blood Glucose Monitoring Suppl (ACCU-CHEK GUIDE) w/Device KIT [id number] No 1 Units by Does not apply route daily. [name], MD Taking Active   dapagliflozin propanediol (FARXIGA) 10 MG TABS tablet [id number] No Take 1 tablet (10 mg total) by mouth daily before breakfast. [name], MD Taking Active   enalapril (VASOTEC) 20 MG tablet [id number] No Take 1 tablet (20 mg total) by mouth at bedtime. [name], MD Taking Active   fluticasone [facility]) 50 MCG/ACT nasal spray [id number] No Place 1 spray into both nostrils daily. [name], MD Taking Active   furosemide (LASIX) 40 MG tablet [id number] No Take 1 tablet (40 mg total) by mouth 2 (two) times daily. [name], MD Taking Active   glucose blood (ACCU-CHEK GUIDE) test strip [id number] No Use as instructed [name], MD Taking Active   isosorbide mononitrate (IMDUR) 30 MG 24 hr tablet [id number] No Take 1 tablet (30 mg total) by mouth daily. [name], MD Taking Active   Multiple Vitamin (MULTIVITAMIN WITH MINERALS) TABS tablet [id number] No Take  1 tablet by mouth daily. [provider] Taking Active   nicotine (NICOTINE STEP 1) 21 mg/24hr patch [id number]  Place 1 patch (21 mg total) onto the skin daily. 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 Russell Collier, [name], MD Taking Active   Tafamidis 61 MG CAPS [id number] No TAKE 1 CAPSULE BY MOUTH DAILY. [name], MD Taking Active   umeclidinium-vilanterol [facility] ELLIPTA) 62.5-25 MCG/INH AEPB [id number] No Inhale 1 puff into the lungs daily as needed. [name], MD Taking Active   valACYclovir (VALTREX) 500 MG tablet [id number] No Take 1 tablet (500 mg total) by mouth daily. 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   COPD exacerbation ([place]) 03/20/2020   Type 2 diabetes mellitus with diabetic peripheral angiopathy without gangrene, without long-term current use of insulin ([place]) 03/20/2020   Coagulation defect ([place]) 12/11/2019   Fatigue 12/10/2019   Leg cramps 12/10/2019   Frequent epistaxis 10/30/2019   Disc degeneration, 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   Ganglion of right knee 09/27/2019   Lumbar radiculopathy 09/27/2019   Lower extremity edema    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[place])    Acute respiratory failure ([place]) 07/27/2019   Primary osteoarthritis of right knee 07/24/2019   Breast tenderness in male 05/31/2019   Chronic pain 05/31/2019   AKI (acute kidney injury) ([place]) 05/31/2019   Pain due to onychomycosis of toenails of both feet 05/16/2019   Chronic knee pain 04/16/2019   Diabetes ([place]) 04/16/2019   Esophageal reflux 02/06/2019   Heartburn 02/06/2019   Chronic skin ulcer of lower leg ([place]) 12/21/2018   S/P drug eluting coronary stent  placement    Coronary artery disease involving native coronary artery of native heart with unstable angina pectoris ([place]) 12/20/2018   Unstable angina (HCC)    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   Acute on chronic diastolic heart failure (HCC)    Shortness of breath    Precordial chest pain    Idiopathic peripheral neuropathy    Palpitations    Chronic diastolic heart failure (HCC)    Abnormal ankle brachial index (ABI)    Chest pain, rule out acute myocardial infarction 09/26/2018   History of cocaine abuse ([place]) 08/20/2018   Marijuana use 08/20/2018   Morbid obesity with BMI of 40.0-44.9, adult ([place]) 08/20/2018   Dyspepsia    Morbid obesity (HCC)    OSA on CPAP    Chest pain 05/30/2018   CAD (coronary artery disease) 03/30/2018   Elevated troponin 02/03/2018   Positive urine drug screen 02/03/2018   Tobacco abuse 02/03/2018   Hyperlipidemia 02/03/2018   Acute respiratory failure with hypoxia ([place]) 02/02/2018   Acute congestive heart failure (HCC)    Keratoconjunctivitis sicca of left eye not specified as Sjogren's 09/06/2017   Long term current use of oral hypoglycemic drug 09/06/2017  Mechanical ectropion of left lower eyelid 09/06/2017   Nuclear sclerotic cataract of both eyes 09/06/2017   Refractive amblyopia, left 09/06/2017   Type 2 diabetes mellitus without complication, without long-term current use of insulin ([place]) 09/06/2017   Hyperopia of both eyes with astigmatism and presbyopia 09/06/2017   Atypical chest pain 12/27/2015   Essential hypertension    Neuropathy     Conditions to be addressed/monitored per PCP order:   pain  Care Plan : [place] of Care  Updates made by [name], RN since 02/05/2021 12:00 AM     Problem: Health Management Needs Related to Pain      Long-Range Goal: Development of Plan of Care to address health management needs related to pain   Start Date: 02/05/2021  Expected End Date: 04/06/2021  Priority: High  Note:    Current Barriers:  Knowledge Deficits related to plan of care for management of new right foot pain and numbness   RNCM Clinical Goal(s):  Patient will verbalize understanding of plan for management of pain as evidenced by patient verbalization of self monitoring activities take all medications exactly as prescribed and will call provider for medication related questions as evidenced by documentation in EMR    attend all scheduled medical appointments: [facility] and 12/22 at [facility] for dermatology as evidenced by provider documentation in EMR        through collaboration with RN Care manager, provider, and care team.   Interventions: Inter-disciplinary care team collaboration (see longitudinal plan of care) Evaluation of current treatment plan related to  self management and patient's adherence to plan as established by provider   Pain:  (Status: New goal.) Long Term Goal  Pain assessment performed Medications reviewed-discussed and advised to take correct dose of prednisone Reviewed provider established plan for pain management; Discussed importance of adherence to all scheduled medical appointments; Counseled on the importance of reporting any/all new or changed pain symptoms or management strategies to pain management provider; Advised patient to report to care team affect of pain on daily activities; Reviewed with patient prescribed pharmacological and nonpharmacological pain relief strategies; Advised patient to report new right foot pain and numbness to Dr. Marcina Millard Surgeon)  Patient Goals/Self-Care Activities: Take medications as prescribed   Attend all scheduled provider appointments Call pharmacy for medication refills 3-7 days in advance of running out of medications Call provider office for new concerns or questions        Follow Up:  Patient agrees to Care Plan and Follow-up.  Plan: The Managed Medicaid care management team will reach out to the patient  again over the next 14 days.  Date/time of next scheduled RN care management/care coordination outreach:  02/19/21 @ 2:30pm  [name] RN, BSN [place] RN Care Coordinator

## 2021-02-06 ENCOUNTER — Telehealth: Payer: Self-pay | Admitting: Pharmacist

## 2021-02-06 DIAGNOSIS — M5136 Other intervertebral disc degeneration, lumbar region: Secondary | ICD-10-CM | POA: Diagnosis not present

## 2021-02-06 NOTE — Telephone Encounter (Signed)
-----   Message from Leavy Cella, [place] sent at 01/19/2021 11:10 AM EST ----- Regarding: Tobacco Intake Reduction to < 10 per day

## 2021-02-06 NOTE — Telephone Encounter (Signed)
Patient contacted for follow/up of tobacco intake reduction / tobacco cessation attempt.   Since last contact patient reports continued smoking - especially since stress of lower limb numbness.   Medications currently being used;  Restarting nicotine patches that came to house yesterday.    Rates IMPORTANCE of quitting tobacco as high. Remains motivated  Total time with patient call and documentation of interaction: 7 minutes.  F/U Phone call planned: 02/2021

## 2021-02-09 ENCOUNTER — Other Ambulatory Visit (HOSPITAL_COMMUNITY): Payer: Self-pay

## 2021-02-09 DIAGNOSIS — M5136 Other intervertebral disc degeneration, lumbar region: Secondary | ICD-10-CM | POA: Diagnosis not present

## 2021-02-10 ENCOUNTER — Encounter: Payer: Self-pay | Admitting: Podiatry

## 2021-02-10 ENCOUNTER — Other Ambulatory Visit: Payer: Self-pay

## 2021-02-10 ENCOUNTER — Ambulatory Visit (INDEPENDENT_AMBULATORY_CARE_PROVIDER_SITE_OTHER): Payer: Medicaid Other | Admitting: Podiatry

## 2021-02-10 DIAGNOSIS — M5136 Other intervertebral disc degeneration, lumbar region: Secondary | ICD-10-CM | POA: Diagnosis not present

## 2021-02-10 DIAGNOSIS — M109 Gout, unspecified: Secondary | ICD-10-CM | POA: Diagnosis not present

## 2021-02-10 DIAGNOSIS — M21371 Foot drop, right foot: Secondary | ICD-10-CM | POA: Diagnosis not present

## 2021-02-10 MED ORDER — DEXAMETHASONE SODIUM PHOSPHATE 120 MG/30ML IJ SOLN
2.0000 mg | Freq: Once | INTRAMUSCULAR | Status: AC
  Administered 2021-02-10: 16:00:00 2 mg via INTRA_ARTICULAR

## 2021-02-10 NOTE — Progress Notes (Signed)
°  Subjective:  Patient ID: Russell Collier, male    DOB: [birth year],  MRN: [id number]  Chief Complaint  Patient presents with   Toe Pain    Right foot - patient went to ED on 12/14-couldn't move the last few toes, xrayed and MRI done, gave him fluid pill-helped some   60 y.o. male presents with the above complaint. History confirmed with patient. States he was walking one day and the following day felt like he had a pulled hamstring in his thigh. Noticed he could not pull up his foot. Was seen at ED and had MRI of lumbar spine and brain performed. This has not improved since he was seen there.   Also complains of recurrence of gout in the big toe joint. Requests injection today.  Objective:  Physical Exam: warm, good capillary refill, no trophic changes or ulcerative lesions, normal DP and PT pulses, and normal sensory exam.  Right Foot: POP right 1st MPJ. Slightly decreased PF strength at ankle. 2/5 DF strength. Decreased anterior group muscle recruitment.   Assessment:   1. Gout, arthritis   2. Right foot drop    Plan:  Patient was evaluated and treated and all questions answered.  Gout Right 1st MPJ -Injection delivered to the painful joint  Procedure: Joint Injection Location: Right 1st MPJ joint Skin Prep: Alcohol. Injectate: 0.5 cc 1% lidocaine plain, 0.5 cc dexamethasone  Disposition: Patient tolerated procedure well. Injection site dressed with a band-aid.  ?Dropfoot right -Lumbar spine MRI reviewed -Able to actively plantarflex foot but decreased DF. Anterior tibial does fire but the other extensors do not appear to do so. Likely radiculopathy. Recommended f/u with his spine doctor for this. Is s/p spinal fusion February 2022.  Return if symptoms worsen or fail to improve.

## 2021-02-11 ENCOUNTER — Other Ambulatory Visit: Payer: Self-pay | Admitting: Family Medicine

## 2021-02-11 DIAGNOSIS — M5136 Other intervertebral disc degeneration, lumbar region: Secondary | ICD-10-CM | POA: Diagnosis not present

## 2021-02-12 ENCOUNTER — Other Ambulatory Visit: Payer: Self-pay

## 2021-02-12 ENCOUNTER — Ambulatory Visit (INDEPENDENT_AMBULATORY_CARE_PROVIDER_SITE_OTHER): Payer: Medicaid Other | Admitting: Family Medicine

## 2021-02-12 VITALS — BP 124/68 | HR 84 | Wt 310.2 lb

## 2021-02-12 DIAGNOSIS — L729 Follicular cyst of the skin and subcutaneous tissue, unspecified: Secondary | ICD-10-CM

## 2021-02-12 DIAGNOSIS — L918 Other hypertrophic disorders of the skin: Secondary | ICD-10-CM | POA: Diagnosis not present

## 2021-02-12 DIAGNOSIS — M5136 Other intervertebral disc degeneration, lumbar region: Secondary | ICD-10-CM | POA: Diagnosis not present

## 2021-02-12 NOTE — Assessment & Plan Note (Signed)
-  cyst likely benign given quality, patient favors removal, will plan for excision on 1/19 in derm clinic, patient agrees with current plan in place

## 2021-02-12 NOTE — Patient Instructions (Addendum)
It was great seeing you today!  The skin tags under your arm are benign, we removed the largest one. Regarding the area on your nose, this seems like a cyst. We can scheduled another time to drain this to try to make it smaller or possibly resolve.   If you notice a lot of bleeding, experience fever or chills, redness, swelling or any development of worsening symptoms then please contact our office.   Please follow up at your next scheduled appointment on 1/19 at 11:10 am, if anything arises between now and then, please don't hesitate to contact our office.   Thank you for allowing [place] to be a part of your medical care!  Thank you, Dr. Larae Grooms

## 2021-02-12 NOTE — Progress Notes (Signed)
° ° °  SUBJECTIVE:   CHIEF COMPLAINT / HPI:   Patient presents with bump on his nose that he has had since 20 years. He noticed that it developed at this size and has not increased in growth or observed any color changes. He shares that this is not particularly bothering him according to symptoms, he does not experience any pain or discharge from it. He wanted to get it removed but wanted to get nose surgery done as he wanted to take of his recurrent nose bleeds first. He has tried multiple times to poke the area and has noticed clear drainage from it. Otherwise has tried popping it without success. He also endorses multiple skin tags under his arms bilaterally but a larger tag that really irritates him when he is wearing sleeveless shirts that he would like to get removed today. These have also been present for 20 years without changes, the most recent one has appeared about 2-3 years ago.   OBJECTIVE:   BP 124/68    Pulse 84    Wt (!) 310 lb 3.2 oz (140.7 kg)    SpO2 98%    BMI 43.26 kg/m   General: Patient well-appearing, in no acute distress. Derm: about 1-2 cm circumscribed movable but firm cyst noted along left side of the nose, no telangiectasias, bleeding or drainage noted, symmetrical in size and skin colored, skin tags noted below the axillary region bilaterally with right side having one measuring as long as 1 cm   Psych: mood appropriate    Procedure Note Risks and benefits discussed with patient, consent signed. Area of interest cleaned with alcohol swabs. Lidocaine with epinephrine used to numb base of skin tag to provide localized sedation. Tweezers used to hold skin tag stable while scissors used to snip the small tag off without any blood loss. Tweezers used to hold larger skin tag stable while using a scalpel to cut tag off at the base, minimal blood loss. Pressure applied with gauze and silver nitrate applied. No further bleeding noted, area bandaged appropriately. Patient tolerated  procedure well without complications.    ASSESSMENT/PLAN:   Skin tag -excision performed, patient tolerated procedure well without complications -return precautions discussed   Skin cyst -cyst likely benign given quality, patient favors removal, will plan for excision on 1/19 in derm clinic, patient agrees with current plan in place      Donney Dice, [place]

## 2021-02-12 NOTE — Assessment & Plan Note (Signed)
-  excision performed, patient tolerated procedure well without complications -return precautions discussed

## 2021-02-13 ENCOUNTER — Telehealth: Payer: Self-pay | Admitting: *Deleted

## 2021-02-13 DIAGNOSIS — M5136 Other intervertebral disc degeneration, lumbar region: Secondary | ICD-10-CM | POA: Diagnosis not present

## 2021-02-13 NOTE — Telephone Encounter (Signed)
PCS services form received via Fax from [facility].  Clinical portion completed as applicable and placed in PCP box ( notes attached).  Will need faxed to [phone] once completed. Christen Bame, CMA

## 2021-02-16 DIAGNOSIS — M5136 Other intervertebral disc degeneration, lumbar region: Secondary | ICD-10-CM | POA: Diagnosis not present

## 2021-02-17 DIAGNOSIS — M5136 Other intervertebral disc degeneration, lumbar region: Secondary | ICD-10-CM | POA: Diagnosis not present

## 2021-02-17 NOTE — Telephone Encounter (Signed)
Forms completed and placed into be faxed pile

## 2021-02-18 DIAGNOSIS — M5136 Other intervertebral disc degeneration, lumbar region: Secondary | ICD-10-CM | POA: Diagnosis not present

## 2021-02-19 ENCOUNTER — Other Ambulatory Visit: Payer: Self-pay | Admitting: *Deleted

## 2021-02-19 ENCOUNTER — Other Ambulatory Visit: Payer: Self-pay

## 2021-02-19 DIAGNOSIS — M5136 Other intervertebral disc degeneration, lumbar region: Secondary | ICD-10-CM | POA: Diagnosis not present

## 2021-02-19 NOTE — Patient Outreach (Signed)
Medicaid Managed Care   Nurse Care Manager Note  02/19/2021 Name:  Russell Collier MRN:  [id number] DOB:  06-09-1960  Russell Collier is an 60 y.o. year old male who is a primary patient of Russell Shave, MD.  The [facility] Managed Care Coordination team was consulted for assistance with:    pain  [name] was given information about Medicaid Managed Care Coordination team services today. Russell Collier Patient agreed to services and verbal consent obtained.  Engaged with patient by telephone for follow up visit in response to provider referral for case management and/or care coordination services.   Assessments/Interventions:  Review of past medical history, allergies, medications, health status, including review of consultants reports, laboratory and other test data, was performed as part of comprehensive evaluation and provision of chronic care management services.  SDOH (Social Determinants of Health) assessments and interventions performed: SDOH Interventions    Flowsheet Row Most Recent Value  SDOH Interventions   Transportation Interventions Intervention Not Indicated       Care Plan  Allergies  Allergen Reactions   Varenicline Other (See Comments)    Patient reports laryngospasm stopped in the ER   Bupropion Other (See Comments)    Headache - moderate/severe - self discontinued agent     Medications Reviewed Today     Reviewed by Russell Montane, RN (Registered Nurse) on 02/19/21 at [place] List Status: <None>   Medication Order Taking? Sig Documenting Provider Last Dose Status Informant  Accu-Chek Softclix Lancets lancets [id number]  Use as instructed Russell Shave, MD  Active   albuterol (PROVENTIL) (2.5 MG/3ML) 0.083% nebulizer solution [id number] Yes Take 3 mLs (2.5 mg total) by nebulization every 6 (six) hours as needed for wheezing or shortness of breath. Russell Shave, MD Taking Active   albuterol (VENTOLIN HFA) 108 (90 Base) MCG/ACT inhaler [id number] Yes  Inhale 2 puffs into the lungs every 6 (six) hours as needed for wheezing or shortness of breath. Russell Shave, MD Taking Active   apixaban [facility]) 2.5 MG TABS tablet [id number] Yes Take 1 tablet (2.5 mg total) by mouth 2 (two) times daily. Russell Shave, MD Taking Active   atorvastatin (LIPITOR) 80 MG tablet [id number] Yes Take 1 tablet (80 mg total) by mouth daily. Russell Shave, MD Taking Active   baclofen (LIORESAL) 10 MG tablet [id number] Yes Take 0.5-1 tablets (5-10 mg total) by mouth 3 (three) times daily as needed. Russell Shave, MD Taking Active   Blood Glucose Monitoring Suppl (ACCU-CHEK GUIDE) w/Device KIT [id number]  1 Units by Does not apply route daily. Russell Shave, MD  Active   dapagliflozin propanediol (FARXIGA) 10 MG TABS tablet [id number] Yes Take 1 tablet (10 mg total) by mouth daily before breakfast. Russell Shave, MD Taking Active   enalapril (VASOTEC) 20 MG tablet [id number] Yes Take 1 tablet (20 mg total) by mouth at bedtime. Russell Shave, MD Taking Active   fluticasone [facility]) 50 MCG/ACT nasal spray [id number] Yes Place 1 spray into both nostrils daily. Russell Shave, MD Taking Active   furosemide (LASIX) 40 MG tablet [id number] Yes Take 1 tablet (40 mg total) by mouth 2 (two) times daily. Russell Shave, MD Taking Active   glucose blood (ACCU-CHEK GUIDE) test strip [id number] Yes Use as instructed Russell Shave, MD Taking Active   isosorbide mononitrate (IMDUR) 30 MG 24 hr tablet [id number] Yes Take 1 tablet (30 mg total) by mouth daily. Russell Shave, MD Taking Active   Multiple Vitamin (MULTIVITAMIN WITH MINERALS) TABS tablet [id number] Yes Take 1  tablet by mouth daily. [provider] Taking Active   nicotine (NICOTINE STEP 1) 21 mg/24hr patch [id number]  Place 1 patch (21 mg total) onto the skin daily.  Patient not taking: Reported on 02/06/2021   Russell Collier, RPH-CPP  Active            Med Note [name], Monico Blitz   Fri Feb 06, 2021   9:23 AM) Ready to start  pantoprazole (PROTONIX) 40 MG tablet [id number] Yes TAKE 1 TABLET (40 MG TOTAL) BY MOUTH DAILY. Russell Shave, MD Taking Active   potassium chloride SA (KLOR-CON) 20 MEQ tablet [id number] Yes TAKE 1 TABLET(20 MEQ) BY MOUTH DAILY Russell Shave, MD Taking Active   predniSONE (DELTASONE) 10 MG tablet [id number] No Take 2 tablets (20 mg total) by mouth 2 (two) times daily with a meal.  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 [name], [name], MD Taking Active   Tafamidis 61 MG CAPS [id number] Yes TAKE 1 CAPSULE BY MOUTH DAILY. Russell Shave, MD Taking Active   umeclidinium-vilanterol [facility] ELLIPTA) 62.5-25 MCG/INH AEPB [id number] Yes Inhale 1 puff into the lungs daily as needed. Russell Shave, MD Taking Active   valACYclovir (VALTREX) 500 MG tablet [id number] Yes Take 1 tablet (500 mg total) by mouth daily. 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   COPD exacerbation ([place]) 03/20/2020   Type 2 diabetes mellitus with diabetic peripheral angiopathy without gangrene, without long-term current use of insulin ([place]) 03/20/2020   Coagulation defect ([place]) 12/11/2019   Fatigue  12/10/2019   Leg cramps 12/10/2019   Frequent epistaxis 10/30/2019   Disc degeneration, 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   Ganglion of right knee 09/27/2019   Lumbar radiculopathy 09/27/2019   Lower extremity edema    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[place])    Acute respiratory failure ([place]) 07/27/2019   Primary osteoarthritis of right knee 07/24/2019   Breast tenderness in male 05/31/2019   Chronic pain 05/31/2019   AKI (acute kidney injury) ([place]) 05/31/2019   Pain due to onychomycosis of toenails of both feet 05/16/2019   Chronic knee pain 04/16/2019   Diabetes ([place]) 04/16/2019   Esophageal reflux 02/06/2019   Heartburn 02/06/2019   Chronic skin ulcer of lower leg ([place]) 12/21/2018   S/P drug eluting coronary stent placement    Coronary artery disease involving native coronary artery of native heart with unstable angina pectoris ([place]) 12/20/2018   Unstable angina (HCC)    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   Acute on chronic diastolic heart failure (HCC)    Shortness of breath    Precordial  chest pain    Idiopathic peripheral neuropathy    Palpitations    Chronic diastolic heart failure (HCC)    Abnormal ankle brachial index (ABI)    Chest pain, rule out acute myocardial infarction 09/26/2018   History of cocaine abuse ([place]) 08/20/2018   Marijuana use 08/20/2018   Morbid obesity with BMI of 40.0-44.9, adult ([place]) 08/20/2018   Dyspepsia    Morbid obesity ([place])    OSA on CPAP    Chest pain 05/30/2018   CAD (coronary artery disease) 03/30/2018   Elevated troponin 02/03/2018   Positive urine drug screen 02/03/2018   Tobacco abuse 02/03/2018   Hyperlipidemia 02/03/2018   Acute respiratory failure with hypoxia ([place]) 02/02/2018   Acute congestive heart failure (HCC)    Keratoconjunctivitis sicca of left eye not specified as Sjogren's 09/06/2017   Long term current use of oral hypoglycemic drug 09/06/2017   Mechanical ectropion of left lower eyelid 09/06/2017   Nuclear sclerotic cataract of both eyes  09/06/2017   Refractive amblyopia, left 09/06/2017   Type 2 diabetes mellitus without complication, without long-term current use of insulin ([place]) 09/06/2017   Hyperopia of both eyes with astigmatism and presbyopia 09/06/2017   Atypical chest pain 12/27/2015   Essential hypertension    Neuropathy     Conditions to be addressed/monitored per PCP order:   pain  Care Plan : [place] of Care  Updates made by Russell Montane, RN since 02/19/2021 12:00 AM     Problem: Health Management Needs Related to Pain      Long-Range Goal: Development of Plan of Care to address health management needs related to pain   Start Date: 02/05/2021  Expected End Date: 04/06/2021  Priority: High  Note:   Current Barriers:  Knowledge Deficits related to plan of care for management of new right foot pain and numbness   RNCM Clinical Goal(s):  Patient will verbalize understanding of plan for management of pain as evidenced by patient verbalization of self monitoring activities take all medications exactly as prescribed and will call provider for medication related questions as evidenced by documentation in EMR    attend all scheduled medical appointments: 12/20 at [facility] and 12/22 at [facility] for dermatology as evidenced by provider documentation in EMR        through collaboration with RN Care manager, provider, and care team. -Met  Interventions: Inter-disciplinary care team collaboration (see longitudinal plan of care) Evaluation of current treatment plan related to  self management and patient's adherence to plan as established by provider   Pain:  (Status: Goal on Track (progressing): YES.) Long Term Goal  Pain assessment performed Medications reviewed-patient reports having and taking all medications as directed Reviewed provider established plan for pain management; Discussed importance of adherence to all scheduled medical appointments; Counseled on the importance of reporting any/all new or  changed pain symptoms or management strategies to pain management provider; Advised patient to report to care team affect of pain on daily activities; Reviewed with patient prescribed pharmacological and nonpharmacological pain relief strategies; Advised to follow up with Dr. Cecile Hearing in March, call for earlier appointment/wait list Provided information on managing pain and fall precaution  Patient Goals/Self-Care Activities: Take medications as prescribed   Attend all scheduled provider appointments Call pharmacy for medication refills 3-7 days in advance of running out of medications Call provider office for new concerns or questions        Follow Up:  Patient agrees to Care Plan and Follow-up.  Plan: The Managed Medicaid care management team will reach out to the patient again over the next 30 days.  Date/time of next scheduled RN care management/care coordination outreach:  03/24/21 @ 2:30pm  [name] RN, BSN [place] RN Care Coordinator

## 2021-02-19 NOTE — Patient Instructions (Signed)
Visit Information  Mr. Mccabe was given information about Medicaid Managed Care team care coordination services as a part of their [place] Medicaid benefit. [name] verbally consented to engagement with the [facility] Managed Care team.   If you are experiencing a medical emergency, please call 911 or report to your local emergency department or urgent care.   If you have a non-emergency medical problem during routine business hours, please contact your provider's office and ask to speak with a nurse.   For questions related to your [facility], please call: [phone] or visit the homepage here: [email or web address]  If you would like to schedule transportation through your [facility], please call the following number at least 2 days in advance of your appointment: [phone].   Call the [place] at [phone], at any time, 24 hours a day, 7 days a week. If you are in danger or need immediate medical attention call 911.  If you would like help to quit smoking, call 1-800-QUIT-NOW 682-626-9499) OR Espaol: 1-855-Djelo-Ya (6-803-212-2482) o para ms informacin haga clic aqu or Text READY to 200-400 to register via text  [name],   Please see education materials related to pain provided as print materials.   The patient verbalized understanding of instructions provided today and agreed to receive a mailed copy of patient instruction and/or educational materials.  Telephone follow up appointment with Managed Medicaid care management team member scheduled for:03/24/21 @ 2:30pm  [name] RN, BSN [place] RN Care Coordinator   Following is a copy of your plan of care:  Care Plan : RN Care Manager Plan of Care  Updates made by [name], RN since 02/19/2021 12:00 AM     Problem: Health  Management Needs Related to Pain      Long-Range Goal: Development of Plan of Care to address health management needs related to pain   Start Date: 02/05/2021  Expected End Date: 04/06/2021  Priority: High  Note:   Current Barriers:  Knowledge Deficits related to plan of care for management of new right foot pain and numbness   RNCM Clinical Goal(s):  Patient will verbalize understanding of plan for management of pain as evidenced by patient verbalization of self monitoring activities take all medications exactly as prescribed and will call provider for medication related questions as evidenced by documentation in EMR    attend all scheduled medical appointments: 12/20 at [facility] and 12/22 at [facility] for dermatology as evidenced by provider documentation in EMR        through collaboration with RN Care manager, provider, and care team. -Met  Interventions: Inter-disciplinary care team collaboration (see longitudinal plan of care) Evaluation of current treatment plan related to  self management and patient's adherence to plan as established by provider   Pain:  (Status: Goal on Track (progressing): YES.) Long Term Goal  Pain assessment performed Medications reviewed-patient reports having and taking all medications as directed Reviewed provider established plan for pain management; Discussed importance of adherence to all scheduled medical appointments; Counseled on the importance of reporting any/all new or changed pain symptoms or management strategies to pain management provider; Advised patient to report to care team affect of pain on daily activities; Reviewed with patient prescribed pharmacological and nonpharmacological pain relief strategies; Advised to follow up with Dr. Cecile Hearing in March, call for earlier appointment/wait list Provided information on managing pain and fall precaution  Patient  Goals/Self-Care Activities: Take medications as prescribed   Attend all scheduled provider  appointments Call pharmacy for medication refills 3-7 days in advance of running out of medications Call provider office for new concerns or questions

## 2021-02-20 DIAGNOSIS — M5136 Other intervertebral disc degeneration, lumbar region: Secondary | ICD-10-CM | POA: Diagnosis not present

## 2021-02-23 DIAGNOSIS — M5136 Other intervertebral disc degeneration, lumbar region: Secondary | ICD-10-CM | POA: Diagnosis not present

## 2021-02-26 ENCOUNTER — Encounter: Payer: Self-pay | Admitting: Family Medicine

## 2021-02-26 ENCOUNTER — Other Ambulatory Visit: Payer: Self-pay

## 2021-02-26 ENCOUNTER — Ambulatory Visit (INDEPENDENT_AMBULATORY_CARE_PROVIDER_SITE_OTHER): Payer: Medicaid Other | Admitting: Family Medicine

## 2021-02-26 VITALS — BP 120/77 | HR 77 | Ht 71.0 in | Wt 315.0 lb

## 2021-02-26 DIAGNOSIS — M109 Gout, unspecified: Secondary | ICD-10-CM | POA: Diagnosis not present

## 2021-02-26 DIAGNOSIS — M5136 Other intervertebral disc degeneration, lumbar region: Secondary | ICD-10-CM | POA: Diagnosis not present

## 2021-02-26 DIAGNOSIS — M21371 Foot drop, right foot: Secondary | ICD-10-CM | POA: Diagnosis present

## 2021-02-26 MED ORDER — COLCHICINE 0.6 MG PO TABS: 0.6000 mg | ORAL_TABLET | Freq: Two times a day (BID) | ORAL | 0 refills | Status: DC

## 2021-02-26 NOTE — Patient Instructions (Signed)
It was great seeing you today!  The pain in your toe may be from gout.  I am giving you a prescription for colchicine which I want you to take twice daily for 2 weeks.  If it does not improve please let me know.  I put a referral in for physical therapy.  If you notice it changes color, the pain worsens, turns black, you notice a wound please be evaluated immediately.  Let me know if you have any questions or concerns.  Hope you have a great afternoon!

## 2021-02-26 NOTE — Assessment & Plan Note (Signed)
Patient with signs and symptoms consistent with gout of the right great toe.  Did receive steroid injection approximately 2 weeks ago with relief for 2 to 3 days.  Will trial on 2 weeks of colchicine 0.6 mg twice daily.  Strict ED and return precautions given.

## 2021-02-26 NOTE — Progress Notes (Signed)
° ° °  SUBJECTIVE:   CHIEF COMPLAINT / HPI:   Right great toe pain Patient presents complaining of pain in his right great toe with difficulty with flexion extension of the toe.  It is red and swollen.  He saw his podiatrist recently who provided an injection.  Reports that this pain has been present since 13 December.  He has also been having trouble with foot drop and uses a Rollator for ambulation.  Denies any wound to the toe, warmth.  Reports pain in the first joint but numbness distal to that.  Has had gout in the past and had uric acid checked.  Uric acid was 8.2 in January of this year.  PERTINENT  PMH / PSH: Hyperuricemia  OBJECTIVE:   BP 120/77    Pulse 77    Ht 5\' 11"  (1.803 m)    Wt (!) 315 lb (142.9 kg)    SpO2 100%    BMI 43.93 kg/m   General: Pleasant 61 year old male, no acute distress cardiac: Regular rate and rhythm Respiratory: Normal work of breathing MSK: Patient has swelling and erythema of right first digit.  Is able to flex and extend the toe but not full range of motion.  No warmth appreciated.  No wounds appreciated.  Pulses palpable.  ASSESSMENT/PLAN:   Gout Patient with signs and symptoms consistent with gout of the right great toe.  Did receive steroid injection approximately 2 weeks ago with relief for 2 to 3 days.  Will trial on 2 weeks of colchicine 0.6 mg twice daily.  Strict ED and return precautions given.     [name], MD [place]

## 2021-02-27 DIAGNOSIS — M5136 Other intervertebral disc degeneration, lumbar region: Secondary | ICD-10-CM | POA: Diagnosis not present

## 2021-03-02 DIAGNOSIS — M5136 Other intervertebral disc degeneration, lumbar region: Secondary | ICD-10-CM | POA: Diagnosis not present

## 2021-03-03 DIAGNOSIS — M5136 Other intervertebral disc degeneration, lumbar region: Secondary | ICD-10-CM | POA: Diagnosis not present

## 2021-03-04 DIAGNOSIS — M5136 Other intervertebral disc degeneration, lumbar region: Secondary | ICD-10-CM | POA: Diagnosis not present

## 2021-03-05 ENCOUNTER — Other Ambulatory Visit (HOSPITAL_COMMUNITY): Payer: Self-pay

## 2021-03-05 DIAGNOSIS — M5136 Other intervertebral disc degeneration, lumbar region: Secondary | ICD-10-CM | POA: Diagnosis not present

## 2021-03-06 DIAGNOSIS — M5136 Other intervertebral disc degeneration, lumbar region: Secondary | ICD-10-CM | POA: Diagnosis not present

## 2021-03-09 DIAGNOSIS — M5136 Other intervertebral disc degeneration, lumbar region: Secondary | ICD-10-CM | POA: Diagnosis not present

## 2021-03-10 ENCOUNTER — Ambulatory Visit: Payer: Self-pay

## 2021-03-10 DIAGNOSIS — M5136 Other intervertebral disc degeneration, lumbar region: Secondary | ICD-10-CM | POA: Diagnosis not present

## 2021-03-11 ENCOUNTER — Telehealth: Payer: Self-pay | Admitting: Pharmacist

## 2021-03-11 ENCOUNTER — Other Ambulatory Visit (HOSPITAL_COMMUNITY): Payer: Self-pay

## 2021-03-11 DIAGNOSIS — M5136 Other intervertebral disc degeneration, lumbar region: Secondary | ICD-10-CM | POA: Diagnosis not present

## 2021-03-11 DIAGNOSIS — Z720 Tobacco use: Secondary | ICD-10-CM

## 2021-03-11 NOTE — Telephone Encounter (Signed)
-----   Message from Leavy Cella, [place] sent at 02/06/2021  9:27 AM EST ----- Regarding: Patches - Progress from 10 per day or less

## 2021-03-11 NOTE — Telephone Encounter (Signed)
Patient contacted for follow/up of tobacco intake reduction / tobacco cessation attempt.  Since last contact patient reports continued smoking 1/2 to 3/4 of a ppd.   Medications currently being used; None being used currently.  Willing to consider new patch but some patches have caused intolerance.  Patient reports that in-hospital patches are well tolerated.  I asked him to consider asking for an alternate manufacturer at his pharmacy. I did reinforce that smoking was the most important thing he should do to prevent limb loss which is a major concern for him currently.  He verbalized understanding of need to quit smoking for better healing of limb wounds and injuries.   [facility] inpatient pharmacy is currently using [employer] - generic patches.   Total time with patient call and documentation of interaction: 13 minutes.  F/U Phone call planned: 3-4 weeks for assessment of progress.

## 2021-03-11 NOTE — Assessment & Plan Note (Signed)
Patient contacted for follow/up of tobacco intake reduction / tobacco cessation attempt.  Since last contact patient reports continued smoking 1/2 to 3/4 of a ppd.   Medications currently being used; None being used currently.  Willing to consider new patch but some patches have caused intolerance.  Patient reports that in-hospital patches are well tolerated.  I asked him to consider asking for an alternate manufacturer at his pharmacy. I did reinforce that smoking was the most important thing he should do to prevent limb loss which is a major concern for him currently.  He verbalized understanding of need to quit smoking for better healing of limb wounds and injuries.

## 2021-03-11 NOTE — Telephone Encounter (Signed)
Noted and agree. 

## 2021-03-12 ENCOUNTER — Ambulatory Visit (INDEPENDENT_AMBULATORY_CARE_PROVIDER_SITE_OTHER): Payer: Medicaid Other | Admitting: Family Medicine

## 2021-03-12 ENCOUNTER — Other Ambulatory Visit: Payer: Self-pay

## 2021-03-12 VITALS — BP 110/66 | HR 79 | Ht 71.0 in | Wt 311.8 lb

## 2021-03-12 DIAGNOSIS — L729 Follicular cyst of the skin and subcutaneous tissue, unspecified: Secondary | ICD-10-CM | POA: Diagnosis present

## 2021-03-12 DIAGNOSIS — L853 Xerosis cutis: Secondary | ICD-10-CM | POA: Diagnosis not present

## 2021-03-12 DIAGNOSIS — M5136 Other intervertebral disc degeneration, lumbar region: Secondary | ICD-10-CM | POA: Diagnosis not present

## 2021-03-12 MED ORDER — TRIAMCINOLONE ACETONIDE 0.1 % EX OINT: 1.0000 "application " | TOPICAL_OINTMENT | Freq: Two times a day (BID) | CUTANEOUS | 1 refills | Status: DC

## 2021-03-12 NOTE — Patient Instructions (Signed)
It was great to meet you!  For the skin spot on your nose, I have placed a referral to plastic surgery. Someone will reach out to you by phone to schedule an appointment.  For your dry skin, I have sent a steroid ointment to your pharmacy. Use this twice daily on the very dry, thickened areas (elbows, ankles). It is extremely important to use moisturizer twice daily-- I recommend vaseline.  In addition, you should consider using dove unscented bar soap and a humidifer in your bedroom.   Take care and seek immediate care sooner if you develop any concerns.  Dr. [facility]

## 2021-03-12 NOTE — Progress Notes (Signed)
° ° °  SUBJECTIVE:   CHIEF COMPLAINT / HPI:   Skin Lesion on Nose Patient here for evaluation and possible removal of a skin lesion on the left side of his nose. Reports it has been present for 20 years. Perhaps increasing in size somewhat over that time. It occasionally gets sore but is not itchy. It will drain white material if he squeezes it. Never drains spontaneously.   Dry Skin Patient would also like to discuss his "eczema". Reports extremely dry skin on bilateral arms (worst on elbows), bilateral legs, and L flank region. Skin is itchy in these areas. Previously told he has eczema. It typically flares in the winter months. He has tried gold bond, benadryl cream, and OTC hydrocortisone which are slightly helpful. Uses vaseline for moisturizer but only uses this once per week at the most.   PERTINENT  PMH / PSH: HFpEF, CAD, COPD, type 2 diabetes  OBJECTIVE:   BP 110/66    Pulse 79    Ht 5\' 11"  (1.803 m)    Wt (!) 311 lb 12.8 oz (141.4 kg)    SpO2 100%    BMI 43.49 kg/m   Gen: alert, NAD, obese Resp: normal respiratory effort on room air Psych: appropriate mood and affect Skin: 1cm well-circumscribed, firm, flesh-colored nodule on L nasal ala as seen below:   Diffusely dry, flaky, cracked skin on bilateral upper and lower extremities with areas of lichenification on bilateral elbows   ASSESSMENT/PLAN:   Skin cyst Lesion on nose most consistent with cyst (epidermal vs dermoid vs pilar). No signs of abscess or infection. Less likely nevus.  Discussed various options including observation vs I&D vs punch biopsy vs excision. Given location on the nose, would favor referral to plastic surgery and patient was agreeable. -Plastics referral placed   Xerosis Cutis Patient with diffusely dry skin and few areas of lichenification on elbows that could represent eczema. Rx sent for triamcinolone 0.1% ointment to use twice daily on thickened areas. Discussed proper skin care/importance of  moisturizing and recommended vaseline at least daily.   Alcus Dad, MD [place]

## 2021-03-12 NOTE — Assessment & Plan Note (Signed)
Lesion on nose most consistent with cyst (epidermal vs dermoid vs pilar). No signs of abscess or infection. Less likely nevus.  Discussed various options including observation vs I&D vs punch biopsy vs excision. Given location on the nose, would favor referral to plastic surgery and patient was agreeable. -Plastics referral placed

## 2021-03-13 DIAGNOSIS — M5136 Other intervertebral disc degeneration, lumbar region: Secondary | ICD-10-CM | POA: Diagnosis not present

## 2021-03-16 DIAGNOSIS — M5136 Other intervertebral disc degeneration, lumbar region: Secondary | ICD-10-CM | POA: Diagnosis not present

## 2021-03-17 DIAGNOSIS — M5136 Other intervertebral disc degeneration, lumbar region: Secondary | ICD-10-CM | POA: Diagnosis not present

## 2021-03-18 DIAGNOSIS — M5136 Other intervertebral disc degeneration, lumbar region: Secondary | ICD-10-CM | POA: Diagnosis not present

## 2021-03-19 DIAGNOSIS — M5136 Other intervertebral disc degeneration, lumbar region: Secondary | ICD-10-CM | POA: Diagnosis not present

## 2021-03-20 ENCOUNTER — Ambulatory Visit (INDEPENDENT_AMBULATORY_CARE_PROVIDER_SITE_OTHER): Payer: Medicaid Other | Admitting: Family Medicine

## 2021-03-20 ENCOUNTER — Encounter: Payer: Self-pay | Admitting: Family Medicine

## 2021-03-20 ENCOUNTER — Other Ambulatory Visit: Payer: Self-pay

## 2021-03-20 VITALS — BP 121/66 | HR 66 | Ht 71.0 in | Wt 312.2 lb

## 2021-03-20 DIAGNOSIS — G629 Polyneuropathy, unspecified: Secondary | ICD-10-CM

## 2021-03-20 DIAGNOSIS — M5136 Other intervertebral disc degeneration, lumbar region: Secondary | ICD-10-CM | POA: Diagnosis not present

## 2021-03-20 DIAGNOSIS — G609 Hereditary and idiopathic neuropathy, unspecified: Secondary | ICD-10-CM

## 2021-03-20 DIAGNOSIS — R200 Anesthesia of skin: Secondary | ICD-10-CM

## 2021-03-20 NOTE — Progress Notes (Signed)
° ° ° °  SUBJECTIVE:   CHIEF COMPLAINT / HPI:   Russell Collier is a 61 y.o. male presents for follow up   Right foot numbness Pt reports right toe shooting pains and numbness to middle of the foot for the last 2 months. Saw podiatry in 02/10/21 recommended pt likely has radiculopathy and recommended f/u with spinal surgeon in March. He is waiting for his appointment. Pt already has numbness in finger tips 2/2 amlyoid and takes pregabalin. Pt is insistent that something be done about his symptoms today before he sees the spinal surgeon.   [place] [facility] Visit from 02/12/2021 in [place]  PHQ-9 Total Score 0         PERTINENT  PMH / PSH: HTN, CAD, CHF, OSA, tobacco use disorder   OBJECTIVE:   BP 121/66    Pulse 66    Ht 5\' 11"  (1.803 m)    Wt (!) 312 lb 3.2 oz (141.6 kg)    SpO2 100%    BMI 43.54 kg/m    General: Alert, no acute distress Cardio: well perfused Pulm: normal work of breathing Neuro: Cranial nerves grossly intact, 5/5 strength lower extremeties   Foot: Inspection:  No obvious bony deformity.  No swelling, erythema, or bruising.  Palpation: mild TTP first MTP ROM: limited ROM of the ankle.  Strength: unable to assess due to limited ROM Neurovascular: normal cap refill, numbness over right first, second and third toes and medial aspect of foot.  ASSESSMENT/PLAN:   Idiopathic peripheral neuropathy Peripheral neuropathy with unknown cause. Could be 2/2 amyloidosis. A1c 2 months ago 6.2 so diabetic is unlikely cause. Obtained tsh, b12 and folate today to rule out other organic pathologies. Referred for nerve conduction studies per pts request and also neurology.    Lattie Haw, MD PGY-3 [place]

## 2021-03-20 NOTE — Patient Instructions (Signed)
Thank you for coming to see me today. It was a pleasure. Today we discussed your foot pain. It could neuropathy or related to the spinal surgeries. I recommend labs for neuropathy and also nerve conduction studies. Have referred you to neurology.  We will get some labs today.  If they are abnormal or we need to do something about them, I will call you.  If they are normal, I will send you a message on MyChart (if it is active) or a letter in the mail.  If you don't hear from [place] in 2 weeks, please call the office at the number below.   Please follow-up with spinal surgeon   If you have any questions or concerns, please do not hesitate to call the office at [phone].  Best wishes,   Dr Posey Pronto

## 2021-03-23 DIAGNOSIS — M5136 Other intervertebral disc degeneration, lumbar region: Secondary | ICD-10-CM | POA: Diagnosis not present

## 2021-03-24 ENCOUNTER — Other Ambulatory Visit: Payer: Self-pay

## 2021-03-24 ENCOUNTER — Other Ambulatory Visit: Payer: Self-pay | Admitting: *Deleted

## 2021-03-24 DIAGNOSIS — M5136 Other intervertebral disc degeneration, lumbar region: Secondary | ICD-10-CM | POA: Diagnosis not present

## 2021-03-24 NOTE — Patient Outreach (Signed)
Medicaid Managed Care   Nurse Care Manager Note  03/24/2021 Name:  Russell Collier MRN:  [id number] DOB:  Mar 28, 1960  Russell Collier is an 61 y.o. year old male who is a primary patient of [name], MD.  The [facility] Managed Care Coordination team was consulted for assistance with:    pain  Russell Collier was given information about Medicaid Managed Care Coordination team services today. Russell Collier Patient agreed to services and verbal consent obtained.  Engaged with patient by telephone for follow up visit in response to provider referral for case management and/or care coordination services.   Assessments/Interventions:  Review of past medical history, allergies, medications, health status, including review of consultants reports, laboratory and other test data, was performed as part of comprehensive evaluation and provision of chronic care management services.  SDOH (Social Determinants of Health) assessments and interventions performed: SDOH Interventions    Flowsheet Row Most Recent Value  SDOH Interventions   Food Insecurity Interventions Intervention Not Indicated       Care Plan  Allergies  Allergen Reactions   Varenicline Other (See Comments)    Patient reports laryngospasm stopped in the ER   Bupropion Other (See Comments)    Headache - moderate/severe - self discontinued agent     Medications Reviewed Today     Reviewed by [name], RN (Registered Nurse) on 03/24/21 at [place] List Status: <None>   Medication Order Taking? Sig Documenting Provider Last Dose Status Informant  Accu-Chek Softclix Lancets lancets [id number] No Use as instructed  Patient not taking: Reported on 03/24/2021   [name], MD Not Taking Active   albuterol (PROVENTIL) (2.5 MG/3ML) 0.083% nebulizer solution [id number] Yes Take 3 mLs (2.5 mg total) by nebulization every 6 (six) hours as needed for wheezing or shortness of breath. [name], MD Taking Active   albuterol  (VENTOLIN HFA) 108 (90 Base) MCG/ACT inhaler [id number] Yes Inhale 2 puffs into the lungs every 6 (six) hours as needed for wheezing or shortness of breath. [name], MD Taking Active   apixaban Russell Collier) 2.5 MG TABS tablet [id number] Yes Take 1 tablet (2.5 mg total) by mouth 2 (two) times daily. [name], MD Taking Active   atorvastatin (LIPITOR) 80 MG tablet [id number] Yes Take 1 tablet (80 mg total) by mouth daily. [name], MD Taking Active   baclofen (LIORESAL) 10 MG tablet [id number] No Take 0.5-1 tablets (5-10 mg total) by mouth 3 (three) times daily as needed.  Patient not taking: Reported on 03/24/2021   [name], MD Not Taking Active   Blood Glucose Monitoring Suppl (ACCU-CHEK GUIDE) w/Device KIT [id number] No 1 Units by Does not apply route daily.  Patient not taking: Reported on 03/24/2021   [name], MD Not Taking Active   colchicine 0.6 MG tablet [id number]  Take 1 tablet (0.6 mg total) by mouth 2 (two) times daily for 14 days. [name], MD  Expired 03/12/21 2359   dapagliflozin propanediol (FARXIGA) 10 MG TABS tablet [id number] Yes Take 1 tablet (10 mg total) by mouth daily before breakfast. [name], MD Taking Active   enalapril (VASOTEC) 20 MG tablet [id number] Yes Take 1 tablet (20 mg total) by mouth at bedtime. [name], MD Taking Active   fluticasone [facility]) 50 MCG/ACT nasal spray [id number] Yes Place 1 spray into both nostrils daily. [name], MD Taking Active   furosemide (LASIX) 40 MG tablet [id number] Yes Take 1 tablet (40 mg total) by mouth 2 (two) times daily. Cresenzo,  Christy Sartorius, MD Taking Active   glucose blood (ACCU-CHEK GUIDE) test strip [id number] No Use as instructed  Patient not taking: Reported on 03/24/2021   [name], MD Not Taking Active   isosorbide mononitrate (IMDUR) 30 MG 24 hr tablet [id number] Yes Take 1 tablet (30 mg total) by mouth daily. [name], MD Taking Active   Multiple Vitamin  (MULTIVITAMIN WITH MINERALS) TABS tablet [id number] Yes Take 1 tablet by mouth daily. [provider] Taking Active   nicotine (NICOTINE STEP 1) 21 mg/24hr patch [id number] No Place 1 patch (21 mg total) onto the skin daily.  Patient not taking: Reported on 02/06/2021   Leavy Cella, RPH-CPP Not Taking Active            Med Note Leavy Cella   Fri Feb 06, 2021  9:23 AM) Ready to start  pantoprazole (PROTONIX) 40 MG tablet [id number] Yes TAKE 1 TABLET (40 MG TOTAL) BY MOUTH DAILY. [name], MD Taking Active   potassium chloride SA (KLOR-CON) 20 MEQ tablet [id number] Yes TAKE 1 TABLET(20 MEQ) BY MOUTH DAILY [name], MD Taking Active   predniSONE (DELTASONE) 10 MG tablet [id number] No Take 2 tablets (20 mg total) by mouth 2 (two) times daily with a meal.  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 [name], [name], MD Taking Active   Tafamidis 61 MG CAPS [id number] Yes TAKE 1 CAPSULE BY MOUTH DAILY. [name], MD Taking Active   triamcinolone ointment (KENALOG) 0.1 % [id number] Yes Apply 1 application topically 2 (two) times daily. Alcus Dad, MD Taking Active   umeclidinium-vilanterol [facility] ELLIPTA) 62.5-25 MCG/INH AEPB [id number] Yes Inhale 1 puff into the lungs daily as needed. [name], MD Taking Active   valACYclovir (VALTREX) 500 MG tablet [id number] Yes Take 1 tablet (500 mg total) by mouth daily. 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   COPD exacerbation ([place]) 03/20/2020   Type 2 diabetes mellitus with diabetic peripheral angiopathy without gangrene, without long-term current use of insulin ([place]) 03/20/2020   Coagulation defect ([place]) 12/11/2019   Fatigue 12/10/2019   Leg cramps 12/10/2019   Frequent epistaxis 10/30/2019   Disc degeneration, 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   Ganglion of right knee 09/27/2019   Lumbar radiculopathy 09/27/2019   Lower extremity edema    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[place])    Acute respiratory failure ([place]) 07/27/2019   Primary osteoarthritis of right knee 07/24/2019   Breast tenderness in male 05/31/2019   Chronic pain 05/31/2019   AKI (acute kidney injury) ([place]) 05/31/2019   Pain due to onychomycosis of toenails of both feet 05/16/2019   Chronic knee  pain 04/16/2019   Diabetes ([place]) 04/16/2019   Esophageal reflux 02/06/2019   Heartburn 02/06/2019   Chronic skin ulcer of lower leg ([place]) 12/21/2018   S/P drug eluting coronary stent placement    Coronary artery disease involving native coronary artery of native heart with unstable angina pectoris ([place]) 12/20/2018   Unstable angina (HCC)    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   Acute on chronic diastolic heart failure (HCC)    Shortness of breath    Precordial chest pain    Idiopathic peripheral neuropathy    Palpitations    Chronic diastolic heart failure (HCC)    Abnormal ankle brachial index (ABI)    Chest pain, rule out acute myocardial infarction 09/26/2018   History of cocaine abuse ([place]) 08/20/2018   Marijuana use 08/20/2018   Morbid obesity with BMI of 40.0-44.9, adult ([place]) 08/20/2018   Dyspepsia    Morbid obesity (HCC)    OSA on CPAP    Chest pain 05/30/2018   CAD (coronary artery disease) 03/30/2018   Elevated troponin  02/03/2018   Positive urine drug screen 02/03/2018   Tobacco abuse 02/03/2018   Hyperlipidemia 02/03/2018   Acute respiratory failure with hypoxia ([place]) 02/02/2018   Acute congestive heart failure (HCC)    Keratoconjunctivitis sicca of left eye not specified as Sjogren's 09/06/2017   Long term current use of oral hypoglycemic drug 09/06/2017   Mechanical ectropion of left lower eyelid 09/06/2017   Nuclear sclerotic cataract of both eyes 09/06/2017   Refractive amblyopia, left 09/06/2017   Type 2 diabetes mellitus without complication, without long-term current use of insulin ([place]) 09/06/2017   Hyperopia of both eyes with astigmatism and presbyopia 09/06/2017   Atypical chest pain 12/27/2015   Essential hypertension    Neuropathy     Conditions to be addressed/monitored per PCP order:   pain  Care Plan : RN Care Manager Plan of Care  Updates made by [name], RN since 03/24/2021 12:00 AM     Problem: Health Management Needs Related to Pain      Long-Range Goal: Development of Plan of Care to address health management needs related to pain   Start Date: 02/05/2021  Expected End Date: 04/24/2021  Priority: High  Note:   Current Barriers:  Knowledge Deficits related to plan of care for management of new right foot pain and numbness   RNCM Clinical Goal(s):  Patient will verbalize understanding of plan for management of pain as evidenced by patient verbalization of self monitoring activities take all medications exactly as prescribed and will call provider for medication related questions as evidenced by documentation in EMR    attend all scheduled medical appointments: 03/25/21 for PT evaluation, 03/27/21 for labs, 04/07/21 with Dr. Haroldine Laws and 04/10/21 with [facility] as evidenced by provider documentation in EMR        through collaboration with RN Care manager, provider, and care team.  Interventions: Inter-disciplinary care team collaboration (see longitudinal plan of  care) Evaluation of current treatment plan related to  self management and patient's adherence to plan as established by provider Provided patient with Office of Patient Experience [phone], to discuss concerns related to previous experience Provided patient with [facility] [phone] to call and schedule per referral   Pain:  (Status: Goal on Track (progressing): YES.) Long Term Goal  Pain assessment performed Medications reviewed-patient reports having and taking all medications as directed Reviewed provider established plan for pain management; Discussed importance of adherence to all scheduled medical appointments; Counseled on  the importance of reporting any/all new or changed pain symptoms or management strategies to pain management provider; Advised patient to report to care team affect of pain on daily activities; Assessed social determinant of health barriers;  Advised to follow up with Dr. Cecile Hearing in March Provided information on managing pain and fall precaution Discussed referral to Neurology-labs to be drawn and Dr. Posey Pronto to finish note before appointment can be scheduled  Patient Goals/Self-Care Activities: Take medications as prescribed   Attend all scheduled provider appointments Call pharmacy for medication refills 3-7 days in advance of running out of medications Call provider office for new concerns or questions        Follow Up:  Patient agrees to Care Plan and Follow-up.  Plan: The Managed Medicaid care management team will reach out to the patient again over the next 30 days.  Date/time of next scheduled RN care management/care coordination outreach:  04/24/21 @ 2:30pm  [name] RN, BSN [place] RN Care Coordinator

## 2021-03-24 NOTE — Patient Instructions (Signed)
Visit Information  Mr. Basnett was given information about Medicaid Managed Care team care coordination services as a part of their [place] Medicaid benefit. [name] verbally consented to engagement with the [facility] Managed Care team.   If you are experiencing a medical emergency, please call 911 or report to your local emergency department or urgent care.   If you have a non-emergency medical problem during routine business hours, please contact your provider's office and ask to speak with a nurse.   For questions related to your [facility], please call: [phone] or visit the homepage here: [email or web address]  If you would like to schedule transportation through your [facility], please call the following number at least 2 days in advance of your appointment: [phone].   Call the [place] at [phone], at any time, 24 hours a day, 7 days a week. If you are in danger or need immediate medical attention call 911.  If you would like help to quit smoking, call 1-800-QUIT-NOW 669-800-1371) OR Espaol: 1-855-Djelo-Ya (4-650-354-6568) o para ms informacin haga clic aqu or Text READY to 200-400 to register via text  [name],   Please see education materials related to gout and managing pain provided as print materials.   The patient verbalized understanding of instructions provided today and agreed to receive a mailed copy of patient instruction and/or educational materials.  Telephone follow up appointment with Managed Medicaid care management team member scheduled for:04/24/21 @ 2:30pm  [name] RN, BSN [place] RN Care Coordinator   Following is a copy of your plan of care:  Care Plan : RN Care Manager Plan of Care  Updates made by [name], RN since 03/24/2021 12:00 AM      Problem: Health Management Needs Related to Pain      Long-Range Goal: Development of Plan of Care to address health management needs related to pain   Start Date: 02/05/2021  Expected End Date: 04/24/2021  Priority: High  Note:   Current Barriers:  Knowledge Deficits related to plan of care for management of new right foot pain and numbness   RNCM Clinical Goal(s):  Patient will verbalize understanding of plan for management of pain as evidenced by patient verbalization of self monitoring activities take all medications exactly as prescribed and will call provider for medication related questions as evidenced by documentation in EMR    attend all scheduled medical appointments: 03/25/21 for PT evaluation, 03/27/21 for labs, 04/07/21 with Dr. Haroldine Laws and 04/10/21 with [facility] as evidenced by provider documentation in EMR        through collaboration with RN Care manager, provider, and care team.  Interventions: Inter-disciplinary care team collaboration (see longitudinal plan of care) Evaluation of current treatment plan related to  self management and patient's adherence to plan as established by provider Provided patient with Office of Patient Experience [phone], to discuss concerns related to previous experience Provided patient with [facility] [phone] to call and schedule per referral   Pain:  (Status: Goal on Track (progressing): YES.) Long Term Goal  Pain assessment performed Medications reviewed-patient reports having and taking all medications as directed Reviewed provider established plan for pain management; Discussed importance of adherence to all scheduled medical appointments; Counseled on the importance of reporting any/all new or changed pain symptoms or management strategies to pain management provider; Advised patient to report to care team affect of pain on  daily activities; Assessed social determinant of health barriers;  Advised to follow up with  Dr. Cecile Hearing in March Provided information on managing pain and fall precaution Discussed referral to Neurology-labs to be drawn and Dr. Posey Pronto to finish note before appointment can be scheduled  Patient Goals/Self-Care Activities: Take medications as prescribed   Attend all scheduled provider appointments Call pharmacy for medication refills 3-7 days in advance of running out of medications Call provider office for new concerns or questions

## 2021-03-24 NOTE — Assessment & Plan Note (Addendum)
Peripheral neuropathy with unknown cause. Could be 2/2 amyloidosis. A1c 2 months ago 6.2 so diabetic is unlikely cause. Obtained tsh, b12 and folate today to rule out other organic pathologies. Referred for nerve conduction studies per pts request and also neurology.

## 2021-03-25 ENCOUNTER — Other Ambulatory Visit: Payer: Self-pay

## 2021-03-25 ENCOUNTER — Ambulatory Visit (HOSPITAL_BASED_OUTPATIENT_CLINIC_OR_DEPARTMENT_OTHER): Payer: Medicaid Other | Attending: Family Medicine | Admitting: Physical Therapy

## 2021-03-25 ENCOUNTER — Encounter (HOSPITAL_BASED_OUTPATIENT_CLINIC_OR_DEPARTMENT_OTHER): Payer: Self-pay | Admitting: Physical Therapy

## 2021-03-25 DIAGNOSIS — M5136 Other intervertebral disc degeneration, lumbar region: Secondary | ICD-10-CM | POA: Diagnosis not present

## 2021-03-25 DIAGNOSIS — R262 Difficulty in walking, not elsewhere classified: Secondary | ICD-10-CM | POA: Diagnosis not present

## 2021-03-25 DIAGNOSIS — M6281 Muscle weakness (generalized): Secondary | ICD-10-CM | POA: Insufficient documentation

## 2021-03-25 DIAGNOSIS — M25671 Stiffness of right ankle, not elsewhere classified: Secondary | ICD-10-CM | POA: Diagnosis not present

## 2021-03-25 DIAGNOSIS — M21371 Foot drop, right foot: Secondary | ICD-10-CM | POA: Diagnosis not present

## 2021-03-25 DIAGNOSIS — M79671 Pain in right foot: Secondary | ICD-10-CM | POA: Insufficient documentation

## 2021-03-25 DIAGNOSIS — M25674 Stiffness of right foot, not elsewhere classified: Secondary | ICD-10-CM | POA: Diagnosis not present
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
## 2021-03-26 DIAGNOSIS — M5136 Other intervertebral disc degeneration, lumbar region: Secondary | ICD-10-CM | POA: Diagnosis not present

## 2021-03-27 ENCOUNTER — Other Ambulatory Visit: Payer: Medicaid Other

## 2021-03-27 ENCOUNTER — Other Ambulatory Visit: Payer: Self-pay

## 2021-03-27 DIAGNOSIS — R200 Anesthesia of skin: Secondary | ICD-10-CM | POA: Diagnosis not present

## 2021-03-27 DIAGNOSIS — I10 Essential (primary) hypertension: Secondary | ICD-10-CM | POA: Diagnosis not present

## 2021-03-27 DIAGNOSIS — M5136 Other intervertebral disc degeneration, lumbar region: Secondary | ICD-10-CM | POA: Diagnosis not present

## 2021-03-28 LAB — BASIC METABOLIC PANEL
BUN/Creatinine Ratio: 11 (ref 10–24)
BUN: 14 mg/dL (ref 8–27)
CO2: 26 mmol/L (ref 20–29)
Calcium: 9.4 mg/dL (ref 8.6–10.2)
Chloride: 104 mmol/L (ref 96–106)
Creatinine, Ser: 1.25 mg/dL (ref 0.76–1.27)
Glucose: 90 mg/dL (ref 70–99)
Potassium: 4.3 mmol/L (ref 3.5–5.2)
Sodium: 143 mmol/L (ref 134–144)
eGFR: 65 mL/min/{1.73_m2} (ref >59)

## 2021-03-28 LAB — VITAMIN B12: Vitamin B-12: 522 pg/mL (ref 232–1245)

## 2021-03-28 LAB — TSH: TSH: 0.845 u[IU]/mL (ref 0.450–4.500)

## 2021-03-28 LAB — FOLATE: Folate: 12.9 ng/mL (ref >3.0)

## 2021-03-30 ENCOUNTER — Other Ambulatory Visit: Payer: Self-pay

## 2021-03-30 ENCOUNTER — Encounter: Payer: Self-pay | Admitting: Neurology

## 2021-03-30 ENCOUNTER — Ambulatory Visit: Payer: Medicaid Other | Admitting: Neurology

## 2021-03-30 VITALS — BP 122/63 | HR 76 | Ht 70.5 in | Wt 309.0 lb

## 2021-03-30 DIAGNOSIS — G5603 Carpal tunnel syndrome, bilateral upper limbs: Secondary | ICD-10-CM

## 2021-03-30 DIAGNOSIS — M5136 Other intervertebral disc degeneration, lumbar region: Secondary | ICD-10-CM | POA: Diagnosis not present

## 2021-03-30 DIAGNOSIS — G63 Polyneuropathy in diseases classified elsewhere: Secondary | ICD-10-CM | POA: Diagnosis not present

## 2021-03-30 DIAGNOSIS — E851 Neuropathic heredofamilial amyloidosis: Secondary | ICD-10-CM
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
## 2021-03-30 NOTE — Patient Instructions (Addendum)
I will reach out to cardiology about starting you on Amvuttra for your neuropathy  Continue physical therapy for back and foot strengthening  Return to clinic in September/October

## 2021-03-31 DIAGNOSIS — M5136 Other intervertebral disc degeneration, lumbar region: Secondary | ICD-10-CM | POA: Diagnosis not present

## 2021-04-01 ENCOUNTER — Other Ambulatory Visit (HOSPITAL_COMMUNITY): Payer: Self-pay

## 2021-04-01 DIAGNOSIS — M5136 Other intervertebral disc degeneration, lumbar region: Secondary | ICD-10-CM | POA: Diagnosis not present

## 2021-04-02 ENCOUNTER — Telehealth (HOSPITAL_COMMUNITY): Payer: Self-pay | Admitting: Pharmacist

## 2021-04-02 DIAGNOSIS — M5136 Other intervertebral disc degeneration, lumbar region: Secondary | ICD-10-CM | POA: Diagnosis not present

## 2021-04-02 NOTE — Telephone Encounter (Signed)
Received message from Dr. Posey Pronto that patient should be started on Amvuttra. Spoke with Dr. Haroldine Laws and he agrees. I reached out to patient to start referral form. He would like to sign the referral form when he comes to [facility] on 04/13/21. Once referral form is signed by both patient and provider, will fax to [place].   Audry Riles, PharmD, BCPS, BCCP, CPP [facility] Pharmacist [phone]

## 2021-04-03 DIAGNOSIS — M5136 Other intervertebral disc degeneration, lumbar region: Secondary | ICD-10-CM | POA: Diagnosis not present

## 2021-04-06 ENCOUNTER — Other Ambulatory Visit (HOSPITAL_COMMUNITY): Payer: Self-pay

## 2021-04-06 DIAGNOSIS — M5136 Other intervertebral disc degeneration, lumbar region: Secondary | ICD-10-CM | POA: Diagnosis not present

## 2021-04-07 ENCOUNTER — Encounter (HOSPITAL_COMMUNITY): Payer: Medicaid Other | Admitting: Internal Medicine

## 2021-04-07 DIAGNOSIS — M5136 Other intervertebral disc degeneration, lumbar region: Secondary | ICD-10-CM | POA: Diagnosis not present

## 2021-04-08 DIAGNOSIS — D481 Neoplasm of uncertain behavior of connective and other soft tissue: Secondary | ICD-10-CM | POA: Diagnosis not present

## 2021-04-08 DIAGNOSIS — M5136 Other intervertebral disc degeneration, lumbar region: Secondary | ICD-10-CM | POA: Diagnosis not present

## 2021-04-09 ENCOUNTER — Other Ambulatory Visit: Payer: Self-pay

## 2021-04-09 ENCOUNTER — Other Ambulatory Visit (HOSPITAL_COMMUNITY): Payer: Self-pay

## 2021-04-09 DIAGNOSIS — M5136 Other intervertebral disc degeneration, lumbar region: Secondary | ICD-10-CM | POA: Diagnosis not present

## 2021-04-10 ENCOUNTER — Ambulatory Visit: Payer: Medicaid Other | Admitting: Podiatry

## 2021-04-10 DIAGNOSIS — M5136 Other intervertebral disc degeneration, lumbar region: Secondary | ICD-10-CM | POA: Diagnosis not present
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
## 2021-04-13 ENCOUNTER — Ambulatory Visit (HOSPITAL_COMMUNITY)
Admission: RE | Admit: 2021-04-13 | Discharge: 2021-04-13 | Disposition: A | Discharge status: Home / Self Care | Payer: Medicaid Other | Source: Ambulatory Visit | Attending: Family Medicine | Admitting: Family Medicine

## 2021-04-13 ENCOUNTER — Encounter (HOSPITAL_COMMUNITY): Payer: Self-pay

## 2021-04-13 ENCOUNTER — Ambulatory Visit (HOSPITAL_COMMUNITY)
Admission: RE | Admit: 2021-04-13 | Discharge: 2021-04-13 | Disposition: A | Discharge status: Home / Self Care | Payer: Medicaid Other | Source: Ambulatory Visit | Attending: Internal Medicine | Admitting: Internal Medicine

## 2021-04-13 ENCOUNTER — Other Ambulatory Visit: Payer: Self-pay

## 2021-04-13 ENCOUNTER — Other Ambulatory Visit (HOSPITAL_COMMUNITY): Payer: Self-pay | Admitting: Internal Medicine

## 2021-04-13 VITALS — BP 102/62 | HR 86 | Wt 308.6 lb

## 2021-04-13 DIAGNOSIS — Z720 Tobacco use: Secondary | ICD-10-CM

## 2021-04-13 DIAGNOSIS — I5032 Chronic diastolic (congestive) heart failure: Secondary | ICD-10-CM

## 2021-04-13 DIAGNOSIS — Z7984 Long term (current) use of oral hypoglycemic drugs: Secondary | ICD-10-CM | POA: Insufficient documentation

## 2021-04-13 DIAGNOSIS — M549 Dorsalgia, unspecified: Secondary | ICD-10-CM | POA: Insufficient documentation

## 2021-04-13 DIAGNOSIS — I959 Hypotension, unspecified: Secondary | ICD-10-CM | POA: Diagnosis not present

## 2021-04-13 DIAGNOSIS — Z79899 Other long term (current) drug therapy: Secondary | ICD-10-CM | POA: Insufficient documentation

## 2021-04-13 DIAGNOSIS — Z955 Presence of coronary angioplasty implant and graft: Secondary | ICD-10-CM | POA: Insufficient documentation

## 2021-04-13 DIAGNOSIS — E119 Type 2 diabetes mellitus without complications: Secondary | ICD-10-CM | POA: Diagnosis not present

## 2021-04-13 DIAGNOSIS — Z6841 Body Mass Index (BMI) 40.0 and over, adult: Secondary | ICD-10-CM | POA: Diagnosis not present

## 2021-04-13 DIAGNOSIS — R002 Palpitations: Secondary | ICD-10-CM

## 2021-04-13 DIAGNOSIS — I43 Cardiomyopathy in diseases classified elsewhere: Secondary | ICD-10-CM | POA: Diagnosis not present

## 2021-04-13 DIAGNOSIS — R0789 Other chest pain: Secondary | ICD-10-CM | POA: Insufficient documentation

## 2021-04-13 DIAGNOSIS — M25569 Pain in unspecified knee: Secondary | ICD-10-CM | POA: Insufficient documentation

## 2021-04-13 DIAGNOSIS — S80929D Unspecified superficial injury of unspecified lower leg, subsequent encounter: Secondary | ICD-10-CM | POA: Insufficient documentation

## 2021-04-13 DIAGNOSIS — I110 Hypertensive heart disease with heart failure: Secondary | ICD-10-CM | POA: Insufficient documentation

## 2021-04-13 DIAGNOSIS — G4733 Obstructive sleep apnea (adult) (pediatric): Secondary | ICD-10-CM | POA: Diagnosis not present

## 2021-04-13 DIAGNOSIS — R000 Tachycardia, unspecified: Secondary | ICD-10-CM | POA: Diagnosis not present

## 2021-04-13 DIAGNOSIS — I2510 Atherosclerotic heart disease of native coronary artery without angina pectoris: Secondary | ICD-10-CM | POA: Diagnosis not present

## 2021-04-13 DIAGNOSIS — X58XXXD Exposure to other specified factors, subsequent encounter: Secondary | ICD-10-CM | POA: Diagnosis not present

## 2021-04-13 DIAGNOSIS — I454 Nonspecific intraventricular block: Secondary | ICD-10-CM | POA: Insufficient documentation

## 2021-04-13 DIAGNOSIS — Z86711 Personal history of pulmonary embolism: Secondary | ICD-10-CM | POA: Diagnosis not present

## 2021-04-13 DIAGNOSIS — G629 Polyneuropathy, unspecified: Secondary | ICD-10-CM | POA: Diagnosis not present

## 2021-04-13 DIAGNOSIS — Z7901 Long term (current) use of anticoagulants: Secondary | ICD-10-CM | POA: Insufficient documentation

## 2021-04-13 DIAGNOSIS — E854 Organ-limited amyloidosis: Secondary | ICD-10-CM

## 2021-04-13 DIAGNOSIS — Z86718 Personal history of other venous thrombosis and embolism: Secondary | ICD-10-CM | POA: Diagnosis not present

## 2021-04-13 DIAGNOSIS — N62 Hypertrophy of breast: Secondary | ICD-10-CM | POA: Insufficient documentation

## 2021-04-13 DIAGNOSIS — M5136 Other intervertebral disc degeneration, lumbar region: Secondary | ICD-10-CM | POA: Diagnosis not present

## 2021-04-13 DIAGNOSIS — F17210 Nicotine dependence, cigarettes, uncomplicated: Secondary | ICD-10-CM | POA: Diagnosis not present

## 2021-04-13 DIAGNOSIS — I25110 Atherosclerotic heart disease of native coronary artery with unstable angina pectoris: Secondary | ICD-10-CM

## 2021-04-13 LAB — BASIC METABOLIC PANEL
Anion gap: 11 (ref 5–15)
BUN: 8 mg/dL (ref 8–23)
CO2: 28 mmol/L (ref 22–32)
Calcium: 9.3 mg/dL (ref 8.9–10.3)
Chloride: 101 mmol/L (ref 98–111)
Creatinine, Ser: 1.31 mg/dL — ABNORMAL HIGH (ref 0.61–1.24)
GFR, Estimated: >60 mL/min (ref >60)
Glucose, Bld: 106 mg/dL — ABNORMAL HIGH (ref 70–99)
Potassium: 3.8 mmol/L (ref 3.5–5.1)
Sodium: 140 mmol/L (ref 135–145)

## 2021-04-13 LAB — BRAIN NATRIURETIC PEPTIDE: B Natriuretic Peptide: 43.2 pg/mL (ref 0.0–100.0)

## 2021-04-13 LAB — CBC
HCT: 42.8 % (ref 39.0–52.0)
Hemoglobin: 14.1 g/dL (ref 13.0–17.0)
MCH: 29.4 pg (ref 26.0–34.0)
MCHC: 32.9 g/dL (ref 30.0–36.0)
MCV: 89.4 fL (ref 80.0–100.0)
Platelets: 235 10*3/uL (ref 150–400)
RBC: 4.79 MIL/uL (ref 4.22–5.81)
RDW: 16.5 % — ABNORMAL HIGH (ref 11.5–15.5)
WBC: 8.2 10*3/uL (ref 4.0–10.5)
nRBC: 0 % (ref 0.0–0.2)

## 2021-04-13 NOTE — Patient Instructions (Signed)
Thank you for coming in today  Labs were done today, if any labs are abnormal the clinic will call you  Zio patch has been placed on you for 14 days, you have been given the instructions   Your physician has requested that you have an echocardiogram. Echocardiography is a painless test that uses sound waves to create images of your heart. It provides your doctor with information about the size and shape of your heart and how well your hearts chambers and valves are working. This procedure takes approximately one hour. There are no restrictions for this procedure.   You are scheduled for Cardiac Catheterization on 04/15/2021 with Dr. Haroldine Laws  Please arrive at the [facility] at 6:30 a.m.  1. DIET ___ Nothing to eat or drink after midnight except your medications with a sip of water.  3. MAKE SURE YOU TAKE YOUR ASPIRIN.  4. ___ DO NOT TAKE these medications before your procedure: PLEASE Hold ELIQUIS 24 hours before procedure      ___ YOU MAY TAKE ALL of your remaining medications with a small amount of water.   5. Plan for one night stay - bring personal belongings (i.e. toothpaste, toothbrush, etc.)  6. Bring a current list of your medications and current insurance cards.  7. Must have a responsible person to drive you home.  8. Someone must be with you for the first 24 hours after you arrive home.  9. Please wear clothes that are easy to get on and off and wear slip-on shoes.  * Special note: Every effort is made to have your procedure done on time. Occasionally there are emergencies that present themselves at the hospital that may cause delays. Please be patient if a delay does occur.  If you have any questions after you get home, please call the office at the number listed above.   Your physician recommends that you schedule a follow-up appointment in: 2 weeks in clinic   2-3 months with Dr. Haroldine Laws  At the [facility],  you and your health needs are our priority. As part of our continuing mission to provide you with exceptional heart care, we have created designated Provider Care Teams. These Care Teams include your primary Cardiologist (physician) and Advanced Practice Providers (APPs- Physician Assistants and Nurse Practitioners) who all work together to provide you with the care you need, when you need it.   You may see any of the following providers on your designated Care Team at your next follow up: Dr Glori Bickers Dr Haynes Kerns, NP Lyda Jester, [place] [facility] [place], [place] Audry Riles, PharmD   Please be sure to bring in all your medications bottles to every appointment.   If you have any questions or concerns before your next appointment please send [place] a message through [place] or call our office at [phone].    TO LEAVE A MESSAGE FOR THE NURSE SELECT OPTION 2, PLEASE LEAVE A MESSAGE INCLUDING: YOUR NAME DATE OF BIRTH CALL BACK NUMBER REASON FOR CALL**this is important as we prioritize the call backs  YOU WILL RECEIVE A CALL BACK THE SAME DAY AS LONG AS YOU CALL BEFORE 4:00 PM

## 2021-04-13 NOTE — Addendum Note (Signed)
Encounter addended by: Kerry Dory, CMA on: 04/13/2021 10:24 AM  Actions taken: Imaging Exam begun

## 2021-04-13 NOTE — Telephone Encounter (Signed)
Sent in enrollment application to [place] for [place].   Application pending, will continue to follow.    Audry Riles, PharmD, BCPS, BCCP, CPP Heart Failure Clinic Pharmacist [phone]

## 2021-04-14 DIAGNOSIS — M5136 Other intervertebral disc degeneration, lumbar region: Secondary | ICD-10-CM | POA: Diagnosis not present

## 2021-04-15 ENCOUNTER — Ambulatory Visit (HOSPITAL_COMMUNITY)
Admission: RE | Admit: 2021-04-15 | Discharge: 2021-04-15 | Disposition: A | Discharge status: Home / Self Care | Payer: Medicaid Other | Attending: Internal Medicine | Admitting: Internal Medicine

## 2021-04-15 ENCOUNTER — Other Ambulatory Visit: Payer: Self-pay

## 2021-04-15 ENCOUNTER — Ambulatory Visit: Payer: Medicaid Other | Admitting: Podiatry

## 2021-04-15 ENCOUNTER — Ambulatory Visit (HOSPITAL_COMMUNITY)
Admission: RE | Disposition: A | Discharge status: Home / Self Care | Payer: Self-pay | Source: Home / Self Care | Attending: Internal Medicine

## 2021-04-15 DIAGNOSIS — F17210 Nicotine dependence, cigarettes, uncomplicated: Secondary | ICD-10-CM | POA: Insufficient documentation

## 2021-04-15 DIAGNOSIS — E1140 Type 2 diabetes mellitus with diabetic neuropathy, unspecified: Secondary | ICD-10-CM | POA: Insufficient documentation

## 2021-04-15 DIAGNOSIS — Z86711 Personal history of pulmonary embolism: Secondary | ICD-10-CM | POA: Insufficient documentation

## 2021-04-15 DIAGNOSIS — E854 Organ-limited amyloidosis: Secondary | ICD-10-CM | POA: Diagnosis not present

## 2021-04-15 DIAGNOSIS — G4733 Obstructive sleep apnea (adult) (pediatric): Secondary | ICD-10-CM | POA: Diagnosis not present

## 2021-04-15 DIAGNOSIS — I5032 Chronic diastolic (congestive) heart failure: Secondary | ICD-10-CM | POA: Diagnosis not present

## 2021-04-15 DIAGNOSIS — Z955 Presence of coronary angioplasty implant and graft: Secondary | ICD-10-CM | POA: Insufficient documentation

## 2021-04-15 DIAGNOSIS — R0789 Other chest pain: Secondary | ICD-10-CM | POA: Diagnosis present

## 2021-04-15 DIAGNOSIS — Z7984 Long term (current) use of oral hypoglycemic drugs: Secondary | ICD-10-CM | POA: Insufficient documentation

## 2021-04-15 DIAGNOSIS — M5136 Other intervertebral disc degeneration, lumbar region: Secondary | ICD-10-CM | POA: Diagnosis not present

## 2021-04-15 DIAGNOSIS — I110 Hypertensive heart disease with heart failure: Secondary | ICD-10-CM | POA: Diagnosis not present

## 2021-04-15 DIAGNOSIS — Z6841 Body Mass Index (BMI) 40.0 and over, adult: Secondary | ICD-10-CM | POA: Diagnosis not present

## 2021-04-15 DIAGNOSIS — I43 Cardiomyopathy in diseases classified elsewhere: Secondary | ICD-10-CM | POA: Diagnosis not present

## 2021-04-15 DIAGNOSIS — Z7901 Long term (current) use of anticoagulants: Secondary | ICD-10-CM | POA: Diagnosis not present

## 2021-04-15 DIAGNOSIS — Z79899 Other long term (current) drug therapy: Secondary | ICD-10-CM | POA: Insufficient documentation

## 2021-04-15 DIAGNOSIS — I25119 Atherosclerotic heart disease of native coronary artery with unspecified angina pectoris: Secondary | ICD-10-CM | POA: Diagnosis not present

## 2021-04-15 DIAGNOSIS — R002 Palpitations: Secondary | ICD-10-CM | POA: Insufficient documentation

## 2021-04-15 DIAGNOSIS — I2510 Atherosclerotic heart disease of native coronary artery without angina pectoris: Secondary | ICD-10-CM

## 2021-04-15 HISTORY — PX: RIGHT/LEFT HEART CATH AND CORONARY ANGIOGRAPHY: CATH118266

## 2021-04-15 HISTORY — PX: INTRAVASCULAR PRESSURE WIRE/FFR STUDY: CATH118243

## 2021-04-15 HISTORY — PX: CORONARY PRESSURE/FFR STUDY: CATH118243

## 2021-04-15 LAB — POCT I-STAT EG7
Acid-Base Excess: 2 mmol/L (ref 0.0–2.0)
Acid-Base Excess: 2 mmol/L (ref 0.0–2.0)
Bicarbonate: 28.8 mmol/L — ABNORMAL HIGH (ref 20.0–28.0)
Bicarbonate: 29 mmol/L — ABNORMAL HIGH (ref 20.0–28.0)
Calcium, Ion: 1.24 mmol/L (ref 1.15–1.40)
Calcium, Ion: 1.23 mmol/L (ref 1.15–1.40)
HCT: 41 % (ref 39.0–52.0)
HCT: 40 % (ref 39.0–52.0)
Hemoglobin: 13.9 g/dL (ref 13.0–17.0)
Hemoglobin: 13.6 g/dL (ref 13.0–17.0)
O2 Saturation: 71 %
O2 Saturation: 71 %
Potassium: 4 mmol/L (ref 3.5–5.1)
Potassium: 3.9 mmol/L (ref 3.5–5.1)
Sodium: 140 mmol/L (ref 135–145)
Sodium: 140 mmol/L (ref 135–145)
TCO2: 30 mmol/L (ref 22–32)
TCO2: 31 mmol/L (ref 22–32)
pCO2, Ven: 54.8 mmHg (ref 44–60)
pCO2, Ven: 53.6 mmHg (ref 44–60)
pH, Ven: 7.329 (ref 7.25–7.43)
pH, Ven: 7.341 (ref 7.25–7.43)
pO2, Ven: 41 mmHg (ref 32–45)
pO2, Ven: 41 mmHg (ref 32–45)

## 2021-04-15 LAB — POCT I-STAT 7, (LYTES, BLD GAS, ICA,H+H)
Acid-Base Excess: 0 mmol/L (ref 0.0–2.0)
Bicarbonate: 25.8 mmol/L (ref 20.0–28.0)
Calcium, Ion: 1.1 mmol/L — ABNORMAL LOW (ref 1.15–1.40)
HCT: 39 % (ref 39.0–52.0)
Hemoglobin: 13.3 g/dL (ref 13.0–17.0)
O2 Saturation: 99 %
Potassium: 3.6 mmol/L (ref 3.5–5.1)
Sodium: 142 mmol/L (ref 135–145)
TCO2: 27 mmol/L (ref 22–32)
pCO2 arterial: 47.2 mmHg (ref 32–48)
pH, Arterial: 7.345 — ABNORMAL LOW (ref 7.35–7.45)
pO2, Arterial: 143 mmHg — ABNORMAL HIGH (ref 83–108)

## 2021-04-15 LAB — GLUCOSE, CAPILLARY: Glucose-Capillary: 105 mg/dL — ABNORMAL HIGH (ref 70–99)

## 2021-04-15 SURGERY — RIGHT/LEFT HEART CATH AND CORONARY ANGIOGRAPHY
Anesthesia: LOCAL

## 2021-04-15 MED ORDER — FENTANYL CITRATE (PF) 100 MCG/2ML IJ SOLN
INTRAMUSCULAR | Status: AC
  Filled 2021-04-15: qty 2

## 2021-04-15 MED ORDER — HEPARIN SODIUM (PORCINE) 1000 UNIT/ML IJ SOLN
INTRAMUSCULAR | Status: AC
  Filled 2021-04-15: qty 10

## 2021-04-15 MED ORDER — ACETAMINOPHEN 325 MG PO TABS: 650.0000 mg | ORAL_TABLET | ORAL | Status: DC | PRN

## 2021-04-15 MED ORDER — SODIUM CHLORIDE 0.9 % IV SOLN: INTRAVENOUS | Status: DC

## 2021-04-15 MED ORDER — IOHEXOL 350 MG/ML SOLN
INTRAVENOUS | Status: DC | PRN
  Administered 2021-04-15: 30 mL

## 2021-04-15 MED ORDER — SODIUM CHLORIDE 0.9% FLUSH: 3.0000 mL | Freq: Two times a day (BID) | INTRAVENOUS | Status: DC

## 2021-04-15 MED ORDER — HEPARIN SODIUM (PORCINE) 1000 UNIT/ML IJ SOLN
INTRAMUSCULAR | Status: DC | PRN
  Administered 2021-04-15: 6000 [IU] via INTRAVENOUS

## 2021-04-15 MED ORDER — SODIUM CHLORIDE 0.9% FLUSH: 3.0000 mL | INTRAVENOUS | Status: DC | PRN

## 2021-04-15 MED ORDER — IOHEXOL 350 MG/ML SOLN
INTRAVENOUS | Status: DC | PRN
  Administered 2021-04-15: 100 mL

## 2021-04-15 MED ORDER — MIDAZOLAM HCL 2 MG/2ML IJ SOLN
INTRAMUSCULAR | Status: DC | PRN
  Administered 2021-04-15 (×3): 1 mg via INTRAVENOUS

## 2021-04-15 MED ORDER — NITROGLYCERIN 1 MG/10 ML FOR IR/CATH LAB
INTRA_ARTERIAL | Status: DC | PRN
  Administered 2021-04-15: 200 ug via INTRACORONARY

## 2021-04-15 MED ORDER — FENTANYL CITRATE (PF) 100 MCG/2ML IJ SOLN
INTRAMUSCULAR | Status: DC | PRN
Start: 2021-04-15 — End: 2021-04-15
  Administered 2021-04-15 (×2): 25 ug via INTRAVENOUS

## 2021-04-15 MED ORDER — NITROGLYCERIN 1 MG/10 ML FOR IR/CATH LAB
INTRA_ARTERIAL | Status: AC
  Filled 2021-04-15: qty 10

## 2021-04-15 MED ORDER — HEPARIN (PORCINE) IN NACL 1000-0.9 UT/500ML-% IV SOLN
INTRAVENOUS | Status: AC
  Filled 2021-04-15: qty 1000

## 2021-04-15 MED ORDER — MIDAZOLAM HCL 2 MG/2ML IJ SOLN
INTRAMUSCULAR | Status: AC
  Filled 2021-04-15: qty 2

## 2021-04-15 MED ORDER — HEPARIN SODIUM (PORCINE) 1000 UNIT/ML IJ SOLN
INTRAMUSCULAR | Status: AC
Start: 2021-04-15 — End: ?
  Filled 2021-04-15: qty 10

## 2021-04-15 MED ORDER — VERAPAMIL HCL 2.5 MG/ML IV SOLN
INTRAVENOUS | Status: DC | PRN
  Administered 2021-04-15: 10 mL via INTRA_ARTERIAL

## 2021-04-15 MED ORDER — SODIUM CHLORIDE 0.9 % IV SOLN: 250.0000 mL | INTRAVENOUS | Status: DC | PRN

## 2021-04-15 MED ORDER — MIDAZOLAM HCL 2 MG/2ML IJ SOLN: INTRAMUSCULAR | Status: DC | PRN

## 2021-04-15 MED ORDER — LIDOCAINE HCL (PF) 1 % IJ SOLN
INTRAMUSCULAR | Status: DC | PRN
  Administered 2021-04-15: 2 mL
  Administered 2021-04-15: 1 mL
  Administered 2021-04-15: 3 mL

## 2021-04-15 MED ORDER — VERAPAMIL HCL 2.5 MG/ML IV SOLN
INTRAVENOUS | Status: AC
  Filled 2021-04-15: qty 2

## 2021-04-15 MED ORDER — LIDOCAINE HCL (PF) 1 % IJ SOLN
INTRAMUSCULAR | Status: AC
  Filled 2021-04-15: qty 30

## 2021-04-15 MED ORDER — HYDRALAZINE HCL 20 MG/ML IJ SOLN: 10.0000 mg | INTRAMUSCULAR | Status: DC | PRN

## 2021-04-15 MED ORDER — HEPARIN (PORCINE) IN NACL 1000-0.9 UT/500ML-% IV SOLN
INTRAVENOUS | Status: DC | PRN
  Administered 2021-04-15 (×2): 500 mL

## 2021-04-15 MED ORDER — LABETALOL HCL 5 MG/ML IV SOLN: 10.0000 mg | INTRAVENOUS | Status: DC | PRN

## 2021-04-15 MED ORDER — ONDANSETRON HCL 4 MG/2ML IJ SOLN: 4.0000 mg | Freq: Four times a day (QID) | INTRAMUSCULAR | Status: DC | PRN

## 2021-04-15 MED ORDER — ASPIRIN 81 MG PO CHEW
81.0000 mg | CHEWABLE_TABLET | ORAL | Status: AC
  Administered 2021-04-15: 81 mg via ORAL
  Filled 2021-04-15: qty 1

## 2021-04-15 SURGICAL SUPPLY — 17 items
CATH 5FR JL3.5 JR4 ANG PIG MP (CATHETERS) ×1 IMPLANT
CATH BALLN WEDGE 5F 110CM (CATHETERS) ×1 IMPLANT
CATH INFINITI 5 FR IM (CATHETERS) ×1 IMPLANT
CATH INFINITI 5FR AL1 (CATHETERS) ×1 IMPLANT
CATH LAUNCHER 6FR EBU3.5 (CATHETERS) ×1 IMPLANT
DEVICE RAD COMP TR BAND LRG (VASCULAR PRODUCTS) ×1 IMPLANT
GLIDESHEATH SLEND SS 6F .021 (SHEATH) ×1 IMPLANT
GUIDEWIRE .025 260CM (WIRE) ×1 IMPLANT
GUIDEWIRE INQWIRE 1.5J.035X260 (WIRE) IMPLANT
GUIDEWIRE PRESSURE X 175 (WIRE) ×1 IMPLANT
INQWIRE 1.5J .035X260CM (WIRE) ×2
KIT HEART LEFT (KITS) ×1 IMPLANT
KIT MICROPUNCTURE NIT STIFF (SHEATH) ×1 IMPLANT
PACK CARDIAC CATHETERIZATION (CUSTOM PROCEDURE TRAY) ×2 IMPLANT
SHEATH GLIDE SLENDER 4/5FR (SHEATH) ×1 IMPLANT
TRANSDUCER W/STOPCOCK (MISCELLANEOUS) ×2 IMPLANT
TUBING CIL FLEX 10 FLL-RA (TUBING) ×1 IMPLANT

## 2021-04-15 NOTE — Interval H&P Note (Signed)
History and Physical Interval Note:  04/15/2021 9:04 AM  Russell Collier  has presented today for surgery, with the diagnosis of heart failure and chest pressure.  The various methods of treatment have been discussed with the patient and family. After consideration of risks, benefits and other options for treatment, the patient has consented to  Procedure(s): RIGHT/LEFT HEART CATH AND CORONARY ANGIOGRAPHY (N/A) and possible coronary angioplasty as a surgical intervention.  The patient's history has been reviewed, patient examined, no change in status, stable for surgery.  I have reviewed the patient's chart and labs.  Questions were answered to the patient's satisfaction.     Forrester Blando

## 2021-04-16 ENCOUNTER — Encounter (HOSPITAL_COMMUNITY): Payer: Self-pay | Admitting: Internal Medicine

## 2021-04-16 ENCOUNTER — Other Ambulatory Visit: Payer: Self-pay

## 2021-04-16 DIAGNOSIS — M5136 Other intervertebral disc degeneration, lumbar region: Secondary | ICD-10-CM | POA: Diagnosis not present

## 2021-04-16 MED FILL — Heparin Sodium (Porcine) Inj 1000 Unit/ML: INTRAMUSCULAR | Qty: 10 | Status: AC

## 2021-04-17 ENCOUNTER — Telehealth: Payer: Self-pay | Admitting: Pharmacist

## 2021-04-17 DIAGNOSIS — M5136 Other intervertebral disc degeneration, lumbar region: Secondary | ICD-10-CM | POA: Diagnosis not present

## 2021-04-17 NOTE — Telephone Encounter (Signed)
Patient contacted for follow/up of tobacco intake reduction / tobacco cessation attempt.   Since last contact patient reports reduced intake to ~ 1/2 ppd  Medications currently being used; None Rates IMPORTANCE of quitting tobacco as high.   Motivation to quit: health, breathing, heart    Total time with patient call and documentation of interaction: 12 minutes.  F/U Phone call planned: 30days

## 2021-04-17 NOTE — Telephone Encounter (Signed)
-----   Message from Leavy Cella, [place] sent at 03/11/2021 11:55 AM EST ----- Regarding: Tobacco intake reduction from 3/4 ppd

## 2021-04-20 DIAGNOSIS — M5136 Other intervertebral disc degeneration, lumbar region: Secondary | ICD-10-CM | POA: Diagnosis not present

## 2021-04-21 DIAGNOSIS — M5136 Other intervertebral disc degeneration, lumbar region: Secondary | ICD-10-CM | POA: Diagnosis not present

## 2021-04-22 ENCOUNTER — Encounter: Payer: Self-pay | Admitting: Podiatry

## 2021-04-22 ENCOUNTER — Ambulatory Visit (INDEPENDENT_AMBULATORY_CARE_PROVIDER_SITE_OTHER): Payer: Medicaid Other | Admitting: Podiatry

## 2021-04-22 ENCOUNTER — Other Ambulatory Visit: Payer: Self-pay

## 2021-04-22 DIAGNOSIS — M21371 Foot drop, right foot: Secondary | ICD-10-CM

## 2021-04-22 DIAGNOSIS — B351 Tinea unguium: Secondary | ICD-10-CM

## 2021-04-22 DIAGNOSIS — M79675 Pain in left toe(s): Secondary | ICD-10-CM

## 2021-04-22 DIAGNOSIS — M79674 Pain in right toe(s): Secondary | ICD-10-CM

## 2021-04-22 DIAGNOSIS — E1151 Type 2 diabetes mellitus with diabetic peripheral angiopathy without gangrene: Secondary | ICD-10-CM

## 2021-04-22 DIAGNOSIS — M5136 Other intervertebral disc degeneration, lumbar region: Secondary | ICD-10-CM | POA: Diagnosis not present
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
## 2021-04-23 ENCOUNTER — Other Ambulatory Visit (HOSPITAL_COMMUNITY): Payer: Self-pay

## 2021-04-23 ENCOUNTER — Other Ambulatory Visit: Payer: Self-pay | Admitting: Family Medicine

## 2021-04-23 DIAGNOSIS — M5136 Other intervertebral disc degeneration, lumbar region: Secondary | ICD-10-CM | POA: Diagnosis not present

## 2021-04-24 ENCOUNTER — Other Ambulatory Visit: Payer: Self-pay

## 2021-04-24 ENCOUNTER — Other Ambulatory Visit: Payer: Self-pay | Admitting: *Deleted

## 2021-04-24 DIAGNOSIS — M5136 Other intervertebral disc degeneration, lumbar region: Secondary | ICD-10-CM | POA: Diagnosis not present

## 2021-04-24 NOTE — Telephone Encounter (Signed)
Received message from [place] regarding [employer] benefits investgation:  ? ?[facility] Medicaid was contacted at [phone] and information was obtained from [facility] Ref# [id number], who stated the patient has no Deductible, OOP, Co-Ins, or Co-pay. Code [id number] and [phone] require a PA through [employer] at [phone] or fax [phone]. [id number], [id number], and [id number] does not require a PA. Home health is no limit and is based on medical necessity.. ? ?After receiving the above message, prior authorization for codes [phone] and [phone] was submitted. Application is pending.  ? ?Audry Riles, PharmD, BCPS, BCCP, CPP ?Heart Failure Clinic Pharmacist ?[phone] ? ?

## 2021-04-24 NOTE — Patient Instructions (Signed)
Visit Information ? ?Mr. Madlock was given information about Medicaid Managed Care team care coordination services as a part of their [place] Medicaid benefit. [name] verbally consented to engagement with the [facility] Managed Care team.  ? ?If you are experiencing a medical emergency, please call 911 or report to your local emergency department or urgent care.  ? ?If you have a non-emergency medical problem during routine business hours, please contact your provider's office and ask to speak with a nurse.  ? ?For questions related to your [facility], please call: [phone] or visit the homepage here: [email or web address] ? ?If you would like to schedule transportation through your [facility], please call the following number at least 2 days in advance of your appointment: [phone]. ? Rides for urgent appointments can also be made after hours by calling Member Services. ? ?Call the [place] at [phone], at any time, 24 hours a day, 7 days a week. If you are in danger or need immediate medical attention call 911. ? ?If you would like help to quit smoking, call 1-800-QUIT-NOW 641-352-7621) OR Espa?ol: 1-855-D?jelo-Ya ([phone]) o para m?s informaci?n haga clic aqu? or Text READY to 200-400 to register via text ? ?[name], ? ? ?Please see education materials related to pain and HF provided as print materials.  ? ?The patient verbalized understanding of instructions, educational materials, and care plan provided today and agreed to receive a mailed copy of patient instructions, educational materials, and care plan.  ? ?Telephone follow up appointment with Managed Medicaid care management team member scheduled for:05/11/21 @ 3:30pm ? ?[name] RN, BSN ?[place] ?RN Care Coordinator ? ? ?Following is a copy of your  plan of care:  ?Care Plan : [place] of Care  ?Updates made by [name], RN since 04/24/2021 12:00 AM  ?  ? ?Problem: Health Management Needs Related to Pain   ?  ? ?Long-Range Goal: Development of Plan of Care to address health management needs related to pain   ?Start Date: 02/05/2021  ?Expected End Date: 05/22/2021  ?Priority: High  ?Note:   ?Current Barriers:  ?Knowledge Deficits related to plan of care for management of new right foot pain and numbness Mr. Broadus was at [employer] today. RNCM briefly reviewed recent appointments. Mr. Accomando waiting on scheduling with Dr. Claudia Desanctis for removal of cyst. He is aware of upcoming appointments and has arranged transportation.  ? ?RNCM Clinical Goal(s):  ?Patient will verbalize understanding of plan for management of pain as evidenced by patient verbalization of self monitoring activities ?take all medications exactly as prescribed and will call provider for medication related questions as evidenced by documentation in EMR    ?attend all scheduled medical appointments: PT on 3/6 and 3/8, Casting on 3/7, Echocardiogram and HF Clinic on 3/10 as evidenced by provider documentation in EMR        through collaboration with RN Care manager, provider, and care team. ? ?Interventions: ?Inter-disciplinary care team collaboration (see longitudinal plan of care) ?Evaluation of current treatment plan related to  self management and patient's adherence to plan as established by provider ?Reviewed upcoming appointments, patient has arranged transportation ?Advise patient to contact Dr. Claudia Desanctis [phone] to schedule visit ? ? ?Pain:  (Status: Condition stable. Not addressed this visit.) Long Term Goal  ?Pain assessment performed ?Medications reviewed-patient reports having and taking all medications as directed ?Reviewed provider established  plan for pain management; ?Discussed importance of adherence to all scheduled medical appointments; ?Counseled on the importance  of reporting any/all new or changed pain symptoms or management strategies to pain management provider; ?Advised patient to report to care team affect of pain on daily activities; ?Assessed social determinant of health barriers;  ?Advised to follow up with Dr. Cecile Hearing in March ?Provided information on managing pain and fall precaution ?Discussed referral to Neurology-labs to be drawn and Dr. Posey Pronto to finish note before appointment can be scheduled ? ?Patient Goals/Self-Care Activities: ?Take medications as prescribed   ?Attend all scheduled provider appointments ?Call pharmacy for medication refills 3-7 days in advance of running out of medications ?Call provider office for new concerns or questions  ? ? ?  ?  ?

## 2021-04-24 NOTE — Patient Outreach (Addendum)
?Medicaid Managed Care   ?Nurse Care Manager Note ? ?04/24/2021 ?Name:  Russell Collier MRN:  [id number] DOB:  [birth year] ? ?Russell Collier is an 61 y.o. year old male who is a primary patient of [name], MD.  The [facility] Managed Care Coordination team was consulted for assistance with:    ?pain ? ?Russell Collier was given information about Medicaid Managed Care Coordination team services today. Russell Collier Patient agreed to services and verbal consent obtained. ? ?Engaged with patient by telephone for follow up visit in response to provider referral for case management and/or care coordination services.  ? ?Assessments/Interventions:  Review of past medical history, allergies, medications, health status, including review of consultants reports, laboratory and other test data, was performed as part of comprehensive evaluation and provision of chronic care management services. ? ?SDOH (Social Determinants of Health) assessments and interventions performed: ?SDOH Interventions   ? ?Flowsheet Row Most Recent Value  ?SDOH Interventions   ?Transportation Interventions Intervention Not Indicated  ? ?  ? ? ?Care Plan ? ?Allergies  ?Allergen Reactions  ? Varenicline Other (See Comments)  ?  Patient reports laryngospasm stopped in the ER, throat swelling  ? Bupropion Other (See Comments)  ?  Headache - moderate/severe - self discontinued agent   ? Other Rash  ?  Nicotine patches  ? ? ?Medications Reviewed Today   ? ? RNCM unable to review medications today. Patient was not at home and did not have his medications with him. ?  ? ?  ? ? ?Patient Active Problem List  ? Diagnosis Date Noted  ? Gout 02/26/2021  ? Skin tag 02/12/2021  ? Skin cyst 02/12/2021  ? Anticoagulant long-term use 01/29/2021  ? Dermoid cyst of skin of nose 12/24/2020  ? Strain of right calf muscle 07/01/2020  ? Status post lumbar spinal fusion 06/06/2020  ? Impaired mobility and ADLs 04/23/2020  ? Midline low back pain 04/23/2020  ? 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 COPD exacerbation ([name]) 03/20/2020  ? Type 2 diabetes mellitus with diabetic peripheral angiopathy without gangrene, without long-term current use of insulin ([name]) 03/20/2020  ? Coagulation defect ([name]) 12/11/2019  ? Fatigue 12/10/2019  ? Leg cramps 12/10/2019  ? Frequent epistaxis 10/30/2019  ? Disc degeneration, 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  ? Ganglion of right knee 09/27/2019  ? Lumbar radiculopathy 09/27/2019  ? Lower extremity edema   ? 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 Acute respiratory failure ([name]) 07/27/2019  ? Primary osteoarthritis of right knee 07/24/2019  ? Breast tenderness in male 05/31/2019  ? Chronic pain 05/31/2019  ? AKI (acute kidney injury) ([name]) 05/31/2019  ? Pain due to onychomycosis of toenails of both feet 05/16/2019  ? Chronic knee pain 04/16/2019  ? Diabetes ([place]) 04/16/2019  ? Esophageal reflux 02/06/2019  ? Heartburn 02/06/2019  ? Chronic skin ulcer of lower leg ([place]) 12/21/2018  ? S/P drug eluting coronary stent placement   ? Coronary artery disease involving native coronary artery of native heart with unstable angina pectoris ([name]) 12/20/2018  ? Unstable angina ([facility])   ? 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  ? Acute on chronic diastolic heart failure ([name])   ? Shortness of breath   ? Precordial chest pain   ? Idiopathic peripheral neuropathy   ? Palpitations   ? Chronic diastolic heart failure ([place])   ? Abnormal ankle brachial index (ABI)   ? Chest pain, rule out acute myocardial infarction 09/26/2018  ? History of cocaine abuse ([name]) 08/20/2018  ? Marijuana use 08/20/2018  ?  Morbid obesity with BMI of 40.0-44.9, adult ([name]) 08/20/2018  ? Dyspepsia   ? Morbid obesity ([name])   ? OSA on CPAP   ? Chest pain 05/30/2018  ? CAD (coronary artery disease) 03/30/2018  ? Elevated troponin 02/03/2018  ? Positive urine drug screen 02/03/2018  ? Tobacco abuse 02/03/2018  ? Hyperlipidemia 02/03/2018  ? Acute respiratory failure with hypoxia ([name]) 02/02/2018  ? Acute congestive heart failure ([name])    ? Keratoconjunctivitis sicca of left eye not specified as Sjogren's 09/06/2017  ? Long term current use of oral hypoglycemic drug 09/06/2017  ? Mechanical ectropion of left lower eyelid 09/06/2017  ? Nuclear sclerotic cataract of both eyes 09/06/2017  ? Refractive amblyopia, left 09/06/2017  ? Type 2 diabetes mellitus without complication, without long-term current use of insulin ([name]) 09/06/2017  ? Hyperopia of both eyes with astigmatism and presbyopia 09/06/2017  ? Atypical chest pain 12/27/2015  ? Essential hypertension   ? Neuropathy   ? ? ?Conditions to be addressed/monitored per PCP order:   pain ? ?Care Plan : RN Care Manager Plan of Care  ?Updates made by [name], RN since 04/24/2021 12:00 AM  ?  ? ?Problem: Health Management Needs Related to Pain   ?  ? ?Long-Range Goal: Development of Plan of Care to address health management needs related to pain   ?Start Date: 02/05/2021  ?Expected End Date: 05/22/2021  ?Priority: High  ?Note:   ?Current Barriers:  ?Knowledge Deficits related to plan of care for management of new right foot pain and numbness Russell Collier was at [employer] today. RNCM briefly reviewed recent appointments. Russell Collier waiting on scheduling with Dr. Claudia Desanctis for removal of cyst. He is aware of upcoming appointments and has arranged transportation.  ? ?RNCM Clinical Goal(s):  ?Patient will verbalize understanding of plan for management of pain as evidenced by patient verbalization of self monitoring activities ?take all medications exactly as prescribed and will call provider for medication related questions as evidenced by documentation in EMR    ?attend all scheduled medical appointments: PT on 3/6 and 3/8, Casting on 3/7, Echocardiogram and HF Clinic on 3/10 as evidenced by provider documentation in EMR        through collaboration with RN Care manager, provider, and care team. ? ?Interventions: ?Inter-disciplinary care team collaboration (see longitudinal plan of care) ?Evaluation of  current treatment plan related to  self management and patient's adherence to plan as established by provider ?Reviewed upcoming appointments, patient has arranged transportation ?Advise patient to contact Dr. Claudia Desanctis [phone] to schedule visit ? ? ?Pain:  (Status: Condition stable. Not addressed this visit.) Long Term Goal  ?Pain assessment performed ?Medications reviewed-patient reports having and taking all medications as directed ?Reviewed provider established plan for pain management; ?Discussed importance of adherence to all scheduled medical appointments; ?Counseled on the importance of reporting any/all new or changed pain symptoms or management strategies to pain management provider; ?Advised patient to report to care team affect of pain on daily activities; ?Assessed social determinant of health barriers;  ?Advised to follow up with Dr. Cecile Hearing in March ?Provided information on managing pain and fall precaution ?Discussed referral to Neurology-labs to be drawn and Dr. Posey Pronto to finish note before appointment can be scheduled ? ?Patient Goals/Self-Care Activities: ?Take medications as prescribed   ?Attend all scheduled provider appointments ?Call pharmacy for medication refills 3-7 days in advance of running out of medications ?Call provider office for new concerns or questions  ? ? ?  ? ? ?  Follow Up:  Patient agrees to Care Plan and Follow-up. ? ?Plan: The Managed Medicaid care management team will reach out to the patient again over the next 14 days. ? ?Date/time of next scheduled RN care management/care coordination outreach:  05/11/21 @ 3:30pm ? ?[name] RN, BSN ?[place] ?RN Care Coordinator ? ?
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
## 2021-04-27 ENCOUNTER — Ambulatory Visit (HOSPITAL_BASED_OUTPATIENT_CLINIC_OR_DEPARTMENT_OTHER): Payer: Medicaid Other | Attending: Family Medicine | Admitting: Physical Therapy

## 2021-04-27 ENCOUNTER — Other Ambulatory Visit: Payer: Self-pay

## 2021-04-27 DIAGNOSIS — M5136 Other intervertebral disc degeneration, lumbar region: Secondary | ICD-10-CM | POA: Diagnosis not present

## 2021-04-27 DIAGNOSIS — M25671 Stiffness of right ankle, not elsewhere classified: Secondary | ICD-10-CM | POA: Insufficient documentation

## 2021-04-27 DIAGNOSIS — M6281 Muscle weakness (generalized): Secondary | ICD-10-CM | POA: Insufficient documentation

## 2021-04-27 DIAGNOSIS — M79671 Pain in right foot: Secondary | ICD-10-CM | POA: Insufficient documentation

## 2021-04-27 DIAGNOSIS — M25674 Stiffness of right foot, not elsewhere classified: Secondary | ICD-10-CM | POA: Insufficient documentation

## 2021-04-27 DIAGNOSIS — M545 Low back pain, unspecified: Secondary | ICD-10-CM | POA: Insufficient documentation

## 2021-04-27 DIAGNOSIS — R262 Difficulty in walking, not elsewhere classified: Secondary | ICD-10-CM | POA: Diagnosis present

## 2021-04-27 DIAGNOSIS — G8929 Other chronic pain: Secondary | ICD-10-CM | POA: Insufficient documentation

## 2021-04-28 ENCOUNTER — Ambulatory Visit (INDEPENDENT_AMBULATORY_CARE_PROVIDER_SITE_OTHER): Payer: Medicaid Other

## 2021-04-28 ENCOUNTER — Encounter (HOSPITAL_BASED_OUTPATIENT_CLINIC_OR_DEPARTMENT_OTHER): Payer: Self-pay | Admitting: Physical Therapy

## 2021-04-28 DIAGNOSIS — M21371 Foot drop, right foot: Secondary | ICD-10-CM

## 2021-04-28 DIAGNOSIS — M5136 Other intervertebral disc degeneration, lumbar region: Secondary | ICD-10-CM | POA: Diagnosis not present

## 2021-04-28 NOTE — Progress Notes (Signed)
SITUATION ?Patient Name:  Russell Collier ?MRN:   [id number] ?Reason for Visit: Evaluation for Prefab Carbon AFO ? ?Patient Report: ?Chief Complaint:   Foot drop ?Nature of Discomfort/Pain:  Ambulatory ?Location:    right lower extremity ?Onset & Duration:   Sudden ?Course:    stable ?Aggravating or Alleviating Factors: Ambulation ? ?OBJECTIVE DATA & MEASUREMENTS ?Prognosis:    Good ?Duration of use:   5 years ? ?Diagnosis: ?  ICD-10-CM   ?1. Right foot drop  M21.371   ?  ? ? ?Patient Weight:   ?Shoe Size:   12 ? ?GOALS, NECESSITIES, & JUSTIFICATIONS ?Recommended Device: Thomas Hoff Reaction ?Size:    XL ?Side:    right ? ?Laterality HCPCS Code Description Justification  ?right [phone] Ankle foot orthosis (AFO), rigid anterior tibial section, total carbon fiber or equal material, prefabricated, includes fitting and adjustment Necessary to prevent foot drop during ambulation in order to prevent tripping and injury.  ? ? ?I certify that Muhanad Torosyan qualifies for and will benefit from an ankle foot orthosis used during ambulation based on meeting all of the following criteria;  ? ?The patient is: ?- Ambulatory, and ?- Has weakness or deformity of the foot and ankle, and ?- Requires stabilization for medical reasons, and ?- Has the potential to benefit functionally ? ?The patient?s medical record contains sufficient documentation of the patients medical condition to substantiate the necessity for the type and quantity of the items ordered. ? ?The goals of this therapy: ?- Improve Mobility ?- Improve Lower Extremity Stability ?- Decrease Pain ? ?I hereby certify that the ankle foot orthotic described above is a rigid or semi-rigid device which is used for the purpose of supporting a weak or deformed body member or restricting or eliminating motion in a diseased or injured part of the body. It is designed to provide support and counterforce on the limb or body part that is being braced. In my opinion, the ankle foot  orthosis reasonable and necessary in reference to accepted standards of medical practice in the treatment  ?of the patient condition and rehabilitation. ? ?ACTIONS PERFORMED ?Patient was evaluated and measured for Prefabricated Carbon Spiral AFO. Procedure was explained to patient. Patient tolerated procedure. patient selected device color and closure method.  ? ?PLAN ?Patient to return in four to six weeks for fitting and delivery of device. Plan of care was explained to and agreed upon by patient. All questions were answered and concerns addressed. ? ? ? ?  ?

## 2021-04-29 ENCOUNTER — Ambulatory Visit (HOSPITAL_BASED_OUTPATIENT_CLINIC_OR_DEPARTMENT_OTHER): Payer: Medicaid Other | Admitting: Physical Therapy

## 2021-04-29 ENCOUNTER — Other Ambulatory Visit: Payer: Self-pay

## 2021-04-29 ENCOUNTER — Encounter (HOSPITAL_BASED_OUTPATIENT_CLINIC_OR_DEPARTMENT_OTHER): Payer: Self-pay | Admitting: Physical Therapy

## 2021-04-29 DIAGNOSIS — R262 Difficulty in walking, not elsewhere classified: Secondary | ICD-10-CM

## 2021-04-29 DIAGNOSIS — M5136 Other intervertebral disc degeneration, lumbar region: Secondary | ICD-10-CM | POA: Diagnosis not present

## 2021-04-29 DIAGNOSIS — M79671 Pain in right foot: Secondary | ICD-10-CM | POA: Diagnosis not present

## 2021-04-29 DIAGNOSIS — M25674 Stiffness of right foot, not elsewhere classified: Secondary | ICD-10-CM

## 2021-04-29 DIAGNOSIS — M25671 Stiffness of right ankle, not elsewhere classified: Secondary | ICD-10-CM

## 2021-04-29 DIAGNOSIS — M6281 Muscle weakness (generalized): Secondary | ICD-10-CM
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
## 2021-04-30 DIAGNOSIS — M5136 Other intervertebral disc degeneration, lumbar region: Secondary | ICD-10-CM | POA: Diagnosis not present

## 2021-05-01 ENCOUNTER — Ambulatory Visit (HOSPITAL_COMMUNITY): Payer: Medicaid Other

## 2021-05-01 ENCOUNTER — Other Ambulatory Visit (HOSPITAL_COMMUNITY): Payer: Self-pay

## 2021-05-01 ENCOUNTER — Encounter (HOSPITAL_COMMUNITY): Payer: Medicaid Other

## 2021-05-01 ENCOUNTER — Other Ambulatory Visit (HOSPITAL_COMMUNITY): Payer: Medicaid Other

## 2021-05-01 DIAGNOSIS — R29898 Other symptoms and signs involving the musculoskeletal system: Secondary | ICD-10-CM | POA: Diagnosis not present

## 2021-05-01 DIAGNOSIS — Z981 Arthrodesis status: Secondary | ICD-10-CM | POA: Diagnosis not present

## 2021-05-01 DIAGNOSIS — M545 Low back pain, unspecified: Secondary | ICD-10-CM | POA: Diagnosis not present

## 2021-05-01 DIAGNOSIS — M5136 Other intervertebral disc degeneration, lumbar region: Secondary | ICD-10-CM | POA: Diagnosis not present

## 2021-05-01 DIAGNOSIS — G8929 Other chronic pain: Secondary | ICD-10-CM | POA: Diagnosis not present

## 2021-05-01 MED ORDER — AMVUTTRA 25 MG/0.5ML ~~LOC~~ SOSY: 25.0000 mg | PREFILLED_SYRINGE | SUBCUTANEOUS | 0 refills | Status: DC

## 2021-05-01 NOTE — Telephone Encounter (Signed)
Called [employer] to check on status of prior authorization for [employer]. Was unable to connect with a representative and call was transferred to [employer]. Representative there was able to assist in submitting PA over the phone for J codes [phone] and [phone]. PA reference number is [id number]. Call reference number is [id number].  ? ?Audry Riles, PharmD, BCPS, BCCP, CPP ?[facility] Pharmacist ?[phone] ? ?

## 2021-05-04 ENCOUNTER — Other Ambulatory Visit: Payer: Self-pay

## 2021-05-04 ENCOUNTER — Ambulatory Visit (HOSPITAL_BASED_OUTPATIENT_CLINIC_OR_DEPARTMENT_OTHER): Payer: Medicaid Other | Admitting: Physical Therapy

## 2021-05-04 ENCOUNTER — Encounter (HOSPITAL_BASED_OUTPATIENT_CLINIC_OR_DEPARTMENT_OTHER): Payer: Self-pay | Admitting: Physical Therapy

## 2021-05-04 ENCOUNTER — Other Ambulatory Visit (HOSPITAL_COMMUNITY): Payer: Self-pay

## 2021-05-04 DIAGNOSIS — M6281 Muscle weakness (generalized): Secondary | ICD-10-CM

## 2021-05-04 DIAGNOSIS — M79671 Pain in right foot: Secondary | ICD-10-CM

## 2021-05-04 DIAGNOSIS — M25671 Stiffness of right ankle, not elsewhere classified: Secondary | ICD-10-CM

## 2021-05-04 DIAGNOSIS — M25674 Stiffness of right foot, not elsewhere classified: Secondary | ICD-10-CM

## 2021-05-04 DIAGNOSIS — M545 Low back pain, unspecified: Secondary | ICD-10-CM

## 2021-05-04 DIAGNOSIS — M5136 Other intervertebral disc degeneration, lumbar region: Secondary | ICD-10-CM | POA: Diagnosis not present

## 2021-05-04 DIAGNOSIS — R262 Difficulty in walking, not elsewhere classified: Secondary | ICD-10-CM

## 2021-05-04 NOTE — Telephone Encounter (Signed)
Advanced Heart Failure Patient Advocate Encounter ? ?Prior Authorization for [employer] has been approved.   ? ?PA# [id number]: approved coverage for Codes [phone] and [phone] ?Effective dates: 05/01/21 through 05/02/22 ? ?Audry Riles, PharmD, BCPS, BCCP, CPP ?[facility] Pharmacist ?[phone] ? ? ?
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
## 2021-05-05 DIAGNOSIS — M5136 Other intervertebral disc degeneration, lumbar region: Secondary | ICD-10-CM | POA: Diagnosis not present

## 2021-05-06 ENCOUNTER — Other Ambulatory Visit: Payer: Self-pay

## 2021-05-06 ENCOUNTER — Ambulatory Visit (HOSPITAL_BASED_OUTPATIENT_CLINIC_OR_DEPARTMENT_OTHER): Payer: Medicaid Other | Admitting: Physical Therapy

## 2021-05-06 DIAGNOSIS — M5136 Other intervertebral disc degeneration, lumbar region: Secondary | ICD-10-CM | POA: Diagnosis not present

## 2021-05-06 DIAGNOSIS — M79671 Pain in right foot: Secondary | ICD-10-CM | POA: Diagnosis not present

## 2021-05-06 DIAGNOSIS — M6281 Muscle weakness (generalized): Secondary | ICD-10-CM

## 2021-05-06 DIAGNOSIS — M25674 Stiffness of right foot, not elsewhere classified: Secondary | ICD-10-CM

## 2021-05-06 DIAGNOSIS — M545 Low back pain, unspecified: Secondary | ICD-10-CM

## 2021-05-06 DIAGNOSIS — R262 Difficulty in walking, not elsewhere classified: Secondary | ICD-10-CM

## 2021-05-06 DIAGNOSIS — M25671 Stiffness of right ankle, not elsewhere classified: Secondary | ICD-10-CM
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
## 2021-05-07 ENCOUNTER — Encounter (HOSPITAL_BASED_OUTPATIENT_CLINIC_OR_DEPARTMENT_OTHER): Payer: Self-pay | Admitting: Physical Therapy

## 2021-05-07 DIAGNOSIS — M5136 Other intervertebral disc degeneration, lumbar region: Secondary | ICD-10-CM | POA: Diagnosis not present

## 2021-05-07 NOTE — Telephone Encounter (Signed)
Called patient and informed him that prior authorization for Amvuttra was approved. Patient was very Patent attorney. Will administer injection on 05/21/21 after his appointment with the APP.  ? ?Audry Riles, PharmD, BCPS, BCCP, CPP ?[facility] Pharmacist ?[phone] ? ?

## 2021-05-08 DIAGNOSIS — M5136 Other intervertebral disc degeneration, lumbar region: Secondary | ICD-10-CM | POA: Diagnosis not present

## 2021-05-11 ENCOUNTER — Other Ambulatory Visit (HOSPITAL_COMMUNITY): Payer: Self-pay

## 2021-05-11 ENCOUNTER — Ambulatory Visit (HOSPITAL_BASED_OUTPATIENT_CLINIC_OR_DEPARTMENT_OTHER): Payer: Medicaid Other | Admitting: Physical Therapy

## 2021-05-11 ENCOUNTER — Other Ambulatory Visit: Payer: Self-pay | Admitting: *Deleted

## 2021-05-11 ENCOUNTER — Other Ambulatory Visit: Payer: Self-pay

## 2021-05-11 DIAGNOSIS — R262 Difficulty in walking, not elsewhere classified: Secondary | ICD-10-CM

## 2021-05-11 DIAGNOSIS — M79671 Pain in right foot: Secondary | ICD-10-CM | POA: Diagnosis not present

## 2021-05-11 DIAGNOSIS — M545 Low back pain, unspecified: Secondary | ICD-10-CM

## 2021-05-11 DIAGNOSIS — M6281 Muscle weakness (generalized): Secondary | ICD-10-CM

## 2021-05-11 DIAGNOSIS — M25671 Stiffness of right ankle, not elsewhere classified: Secondary | ICD-10-CM

## 2021-05-11 DIAGNOSIS — M25674 Stiffness of right foot, not elsewhere classified: Secondary | ICD-10-CM

## 2021-05-11 DIAGNOSIS — M5136 Other intervertebral disc degeneration, lumbar region: Secondary | ICD-10-CM | POA: Diagnosis not present

## 2021-05-11 NOTE — Patient Outreach (Signed)
Medicaid Managed Care   Nurse Care Manager Note  05/11/2021 Name:  Russell Collier MRN:  [id number] DOB:  Jul 14, 1960  Russell Collier is an 61 y.o. year old male who is a primary patient of Russell Nip, MD.  The [facility] Managed Care Coordination team was consulted for assistance with:    pain  Mr. Stidham was given information about Medicaid Managed Care Coordination team services today. Russell Collier Patient agreed to services and verbal consent obtained.  Engaged with patient by telephone for follow up visit in response to provider referral for case management and/or care coordination services.   Assessments/Interventions:  Review of past medical history, allergies, medications, health status, including review of consultants reports, laboratory and other test data, was performed as part of comprehensive evaluation and provision of chronic care management services.  SDOH (Social Determinants of Health) assessments and interventions performed: SDOH Interventions    Flowsheet Row Most Recent Value  SDOH Interventions   Physical Activity Interventions Intervention Not Indicated       Care Plan  Allergies  Allergen Reactions   Varenicline Other (See Comments)    Patient reports laryngospasm stopped in the ER, throat swelling   Bupropion Other (See Comments)    Headache - moderate/severe - self discontinued agent    Other Rash    Nicotine patches    Medications Reviewed Today     Reviewed by Russell Dach, RN (Registered Nurse) on 05/11/21 at 1555  Med List Status: <None>   Medication Order Taking? Sig Documenting Provider Last Dose Status Informant  Accu-Chek Softclix Lancets lancets [id number] Yes Use as instructed Russell Nip, MD Taking Active Self  albuterol (PROVENTIL) (2.5 MG/3ML) 0.083% nebulizer solution [id number] Yes Take 3 mLs (2.5 mg total) by nebulization every 6 (six) hours as needed for wheezing or shortness of breath. Russell Nip, MD Taking Active Self   albuterol (VENTOLIN HFA) 108 (90 Base) MCG/ACT inhaler [id number] Yes Inhale 2 puffs into the lungs every 6 (six) hours as needed for wheezing or shortness of breath. Russell Nip, MD Taking Active Self  apixaban (ELIQUIS) 2.5 MG TABS tablet [id number] Yes Take 1 tablet (2.5 mg total) by mouth 2 (two) times daily. Russell Nip, MD Taking Active Self  atorvastatin (LIPITOR) 80 MG tablet [id number] Yes Take 1 tablet (80 mg total) by mouth daily. Russell Nip, MD Taking Active Self  Blood Glucose Monitoring Suppl (ACCU-CHEK GUIDE) w/Device KIT [id number] Yes 1 Units by Does not apply route daily. Russell Nip, MD Taking Active Self  dapagliflozin propanediol (FARXIGA) 10 MG TABS tablet [id number] Yes Take 1 tablet (10 mg total) by mouth daily before breakfast. Russell Nip, MD Taking Active Self  enalapril (VASOTEC) 20 MG tablet [id number] Yes Take 1 tablet (20 mg total) by mouth at bedtime. Russell Nip, MD Taking Active Self  fluticasone [facility]) 50 MCG/ACT nasal spray [id number] Yes Place 1 spray into both nostrils daily. Russell Nip, MD Taking Active Self  furosemide (LASIX) 40 MG tablet [id number] Yes Take 1 tablet (40 mg total) by mouth 2 (two) times daily. Russell Nip, MD Taking Active Self  glucose blood (ACCU-CHEK GUIDE) test strip [id number] Yes Use as instructed Russell Nip, MD Taking Active Self  ibuprofen (ADVIL) 200 MG tablet [id number] Yes Take 800 mg by mouth every 6 (six) hours as needed for moderate pain. [provider] Taking Active Self  isosorbide mononitrate (IMDUR) 30 MG 24 hr tablet [id number] Yes Take 1 tablet (30 mg total) by mouth daily. Russell Nip, MD Taking Active Self  Multiple Vitamin (MULTIVITAMIN WITH MINERALS) TABS tablet [id number] Yes Take 1 tablet by mouth daily. [provider] Taking Active Self  pantoprazole (PROTONIX) 40 MG tablet [id number] Yes TAKE 1 TABLET (40 MG TOTAL) BY MOUTH DAILY. 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 [name], Bevelyn Buckles, MD Taking Active Self  triamcinolone ointment (KENALOG) 0.1 % [id number] Yes Apply 1 application topically 2 (two) times daily. Russell Dus, MD Taking Active Self  umeclidinium-vilanterol [facility] ELLIPTA) 62.5-25 MCG/INH AEPB [id number] Yes Inhale 1 puff into the lungs daily as needed. Russell Nip, MD Taking Active Self  valACYclovir (VALTREX) 500 MG tablet [id number] Yes Take 1 tablet (500 mg total) by mouth daily. Russell Nip, MD Taking Active Self  vutrisiran sodium (AMVUTTRA) 25 MG/0.5ML syringe [id number]  Inject 0.5 mLs (25 mg total) into the skin every 3 (three) months. 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   COPD exacerbation (HCC) 03/20/2020   Type 2 diabetes mellitus with diabetic peripheral angiopathy without gangrene, without long-term current use of insulin (HCC) 03/20/2020   Coagulation defect (HCC) 12/11/2019   Fatigue 12/10/2019   Leg cramps 12/10/2019   Frequent epistaxis 10/30/2019   Disc degeneration, 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   Ganglion of right knee 09/27/2019   Lumbar radiculopathy 09/27/2019   Lower extremity edema    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HCC)    Acute respiratory failure  (HCC) 07/27/2019   Primary osteoarthritis of right knee 07/24/2019   Breast tenderness in male 05/31/2019   Chronic pain 05/31/2019   AKI (acute kidney injury) (HCC) 05/31/2019   Pain due to onychomycosis of toenails of both feet 05/16/2019   Chronic knee pain 04/16/2019   Diabetes (HCC) 04/16/2019   Esophageal reflux 02/06/2019   Heartburn 02/06/2019   Chronic skin ulcer of lower leg (HCC) 12/21/2018   S/P drug eluting coronary stent placement    Coronary artery disease involving native coronary artery of native heart with unstable angina pectoris (HCC) 12/20/2018   Unstable angina (HCC)    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   Acute on chronic diastolic heart failure (HCC)    Shortness of breath    Precordial chest pain    Idiopathic peripheral neuropathy    Palpitations    Chronic diastolic heart failure (HCC)    Abnormal ankle brachial index (ABI)    Chest pain, rule out acute myocardial infarction 09/26/2018   History of cocaine abuse (HCC) 08/20/2018   Marijuana use 08/20/2018   Morbid obesity with BMI of 40.0-44.9, adult (HCC) 08/20/2018   Dyspepsia    Morbid obesity (HCC)    OSA on CPAP    Chest pain 05/30/2018   CAD (coronary artery disease) 03/30/2018   Elevated troponin  02/03/2018   Positive urine drug screen 02/03/2018   Tobacco abuse 02/03/2018   Hyperlipidemia 02/03/2018   Acute respiratory failure with hypoxia (HCC) 02/02/2018   Acute congestive heart failure (HCC)    Keratoconjunctivitis sicca of left eye not specified as Sjogren's 09/06/2017   Long term current use of oral hypoglycemic drug 09/06/2017   Mechanical ectropion of left lower eyelid 09/06/2017   Nuclear sclerotic cataract of both eyes 09/06/2017   Refractive amblyopia, left 09/06/2017   Type 2 diabetes mellitus without complication, without long-term current use of insulin (HCC) 09/06/2017   Hyperopia of both eyes with astigmatism and presbyopia 09/06/2017   Atypical chest pain 12/27/2015   Essential  hypertension    Neuropathy     Conditions to be addressed/monitored per PCP order:   pain  Care Plan : RN Care Manager Plan of Care  Updates made by Russell Dach, RN since 05/11/2021 12:00 AM     Problem: Health Management Needs Related to Pain      Long-Range Goal: Development of Plan of Care to address health management needs related to pain   Start Date: 02/05/2021  Expected End Date: 06/19/2021  Priority: High  Note:   Current Barriers:  Knowledge Deficits related to plan of care for management of new right foot pain and numbness Mr. Schoenig was on the way home from PT today. He is aware of appointment with Dr. Arita Miss on 05/28/21. He reports having all of his medication and is excited to be starting a new medication Amvuttra for Amyloidosis. Mr. Pattison reports having increased flexibility in his right ankle since starting PT.  RNCM Clinical Goal(s):  Patient will verbalize understanding of plan for management of pain as evidenced by patient verbalization of self monitoring activities take all medications exactly as prescribed and will call provider for medication related questions as evidenced by documentation in EMR    attend all scheduled medical appointments: PT as listed on AVS, Echocardiogram and HF Clinic on 05/21/21 and Dr. Arita Miss on 05/28/21 as evidenced by provider documentation in EMR        through collaboration with RN Care manager, provider, and care team.  Interventions: Inter-disciplinary care team collaboration (see longitudinal plan of care) Evaluation of current treatment plan related to  self management and patient's adherence to plan as established by provider Reviewed upcoming appointments-Will mail AVS for patient to have printed copy of upcoming appointments   Pain:  (Status: Goal on Track (progressing): YES.) Long Term Goal  Pain assessment performed Medications reviewed-patient reports having and taking all medications as directed Discussed importance of  adherence to all scheduled medical appointments; Counseled on the importance of reporting any/all new or changed pain symptoms or management strategies to pain management provider; Advised patient to report to care team affect of pain on daily activities; Discussed use of relaxation techniques and/or diversional activities to assist with pain reduction (distraction, imagery, relaxation, massage, acupressure, TENS, heat, and cold application; Assessed social determinant of health barriers;    Patient Goals/Self-Care Activities: Take medications as prescribed   Attend all scheduled provider appointments Call pharmacy for medication refills 3-7 days in advance of running out of medications Call provider office for new concerns or questions        Follow Up:  Patient agrees to Care Plan and Follow-up.  Plan: The Managed Medicaid care management team will reach out to the patient again over the next 30 days.  Date/time of next scheduled RN care management/care coordination outreach:  06/10/21 @  2:30pm  Estanislado Emms RN, BSN [place]  [employer]
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
## 2021-05-11 NOTE — Patient Instructions (Signed)
Visit Information ? ?Mr. Kundrat was given information about Medicaid Managed Care team care coordination services as a part of their [place] Medicaid benefit. [name] verbally consented to engagement with the [facility] Managed Care team.  ? ?If you are experiencing a medical emergency, please call 911 or report to your local emergency department or urgent care.  ? ?If you have a non-emergency medical problem during routine business hours, please contact your provider's office and ask to speak with a nurse.  ? ?For questions related to your [facility], please call: [phone] or visit the homepage here: [email or web address] ? ?If you would like to schedule transportation through your [facility], please call the following number at least 2 days in advance of your appointment: [phone]. ? Rides for urgent appointments can also be made after hours by calling Member Services. ? ?Call the [place] at [phone], at any time, 24 hours a day, 7 days a week. If you are in danger or need immediate medical attention call 911. ? ?If you would like help to quit smoking, call 1-800-QUIT-NOW (863) 380-4918) OR Espa?ol: 1-855-D?jelo-Ya 502-196-2881) o para m?s informaci?n haga clic aqu? or Text READY to 200-400 to register via text ? ?[name], ? ? ?Please see education materials related to managing pain provided as print materials.  ? ?The patient verbalized understanding of instructions, educational materials, and care plan provided today and agreed to receive a mailed copy of patient instructions, educational materials, and care plan.  ? ?Telephone follow up appointment with Managed Medicaid care management team member scheduled for:06/10/21 @ 2:30pm ? ?[name] RN, BSN ?[place] ?RN Care Coordinator ? ? ?Following is a copy of  your plan of care:  ?Care Plan : [place] of Care  ?Updates made by [name], RN since 05/11/2021 12:00 AM  ?  ? ?Problem: Health Management Needs Related to Pain   ?  ? ?Long-Range Goal: Development of Plan of Care to address health management needs related to pain   ?Start Date: 02/05/2021  ?Expected End Date: 06/19/2021  ?Priority: High  ?Note:   ?Current Barriers:  ?Knowledge Deficits related to plan of care for management of new right foot pain and numbness Mr. Whyte was on the way home from PT today. He is aware of appointment with Dr. Claudia Desanctis on 05/28/21. He reports having all of his medication and is excited to be starting a new medication Amvuttra for Amyloidosis. Mr. Toro reports having increased flexibility in his right ankle since starting PT. ? ?RNCM Clinical Goal(s):  ?Patient will verbalize understanding of plan for management of pain as evidenced by patient verbalization of self monitoring activities ?take all medications exactly as prescribed and will call provider for medication related questions as evidenced by documentation in EMR    ?attend all scheduled medical appointments: PT as listed on AVS, Echocardiogram and HF Clinic on 05/21/21 and Dr. Claudia Desanctis on 05/28/21 as evidenced by provider documentation in EMR        through collaboration with RN Care manager, provider, and care team. ? ?Interventions: ?Inter-disciplinary care team collaboration (see longitudinal plan of care) ?Evaluation of current treatment plan related to  self management and patient's adherence to plan as established by provider ?Reviewed upcoming appointments-Will mail AVS for patient to have printed copy of upcoming appointments ? ? ?Pain:  (Status: Goal on Track (progressing): YES.) Long Term Goal  ?Pain assessment  performed ?Medications reviewed-patient reports having and taking all medications as directed ?Discussed importance of adherence to all scheduled medical appointments; ?Counseled on the importance of  reporting any/all new or changed pain symptoms or management strategies to pain management provider; ?Advised patient to report to care team affect of pain on daily activities; ?Discussed use of relaxation techniques and/or diversional activities to assist with pain reduction (distraction, imagery, relaxation, massage, acupressure, TENS, heat, and cold application; ?Assessed social determinant of health barriers;  ? ? ?Patient Goals/Self-Care Activities: ?Take medications as prescribed   ?Attend all scheduled provider appointments ?Call pharmacy for medication refills 3-7 days in advance of running out of medications ?Call provider office for new concerns or questions  ? ? ?  ?  ?

## 2021-05-12 ENCOUNTER — Encounter (HOSPITAL_BASED_OUTPATIENT_CLINIC_OR_DEPARTMENT_OTHER): Payer: Self-pay | Admitting: Physical Therapy

## 2021-05-12 ENCOUNTER — Other Ambulatory Visit: Payer: Self-pay

## 2021-05-12 DIAGNOSIS — M5136 Other intervertebral disc degeneration, lumbar region: Secondary | ICD-10-CM | POA: Diagnosis not present

## 2021-05-13 ENCOUNTER — Other Ambulatory Visit: Payer: Self-pay

## 2021-05-13 ENCOUNTER — Encounter (HOSPITAL_BASED_OUTPATIENT_CLINIC_OR_DEPARTMENT_OTHER): Payer: Self-pay | Admitting: Physical Therapy

## 2021-05-13 ENCOUNTER — Ambulatory Visit (HOSPITAL_BASED_OUTPATIENT_CLINIC_OR_DEPARTMENT_OTHER): Payer: Medicaid Other | Admitting: Physical Therapy

## 2021-05-13 DIAGNOSIS — M25671 Stiffness of right ankle, not elsewhere classified: Secondary | ICD-10-CM

## 2021-05-13 DIAGNOSIS — R262 Difficulty in walking, not elsewhere classified: Secondary | ICD-10-CM

## 2021-05-13 DIAGNOSIS — M79671 Pain in right foot: Secondary | ICD-10-CM | POA: Diagnosis not present

## 2021-05-13 DIAGNOSIS — G8929 Other chronic pain: Secondary | ICD-10-CM

## 2021-05-13 DIAGNOSIS — M5136 Other intervertebral disc degeneration, lumbar region: Secondary | ICD-10-CM | POA: Diagnosis not present

## 2021-05-13 DIAGNOSIS — M25674 Stiffness of right foot, not elsewhere classified: Secondary | ICD-10-CM

## 2021-05-13 DIAGNOSIS — M6281 Muscle weakness (generalized): Secondary | ICD-10-CM
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
## 2021-05-14 DIAGNOSIS — M5136 Other intervertebral disc degeneration, lumbar region: Secondary | ICD-10-CM | POA: Diagnosis not present

## 2021-05-15 DIAGNOSIS — M5136 Other intervertebral disc degeneration, lumbar region: Secondary | ICD-10-CM | POA: Diagnosis not present

## 2021-05-18 ENCOUNTER — Ambulatory Visit (HOSPITAL_BASED_OUTPATIENT_CLINIC_OR_DEPARTMENT_OTHER): Payer: Medicaid Other | Admitting: Physical Therapy

## 2021-05-18 DIAGNOSIS — M5136 Other intervertebral disc degeneration, lumbar region: Secondary | ICD-10-CM | POA: Diagnosis not present

## 2021-05-19 ENCOUNTER — Other Ambulatory Visit: Payer: Self-pay | Admitting: Family Medicine

## 2021-05-19 DIAGNOSIS — M5136 Other intervertebral disc degeneration, lumbar region: Secondary | ICD-10-CM | POA: Diagnosis not present

## 2021-05-20 ENCOUNTER — Encounter (HOSPITAL_BASED_OUTPATIENT_CLINIC_OR_DEPARTMENT_OTHER): Payer: Self-pay | Admitting: Physical Therapy

## 2021-05-20 ENCOUNTER — Ambulatory Visit (HOSPITAL_BASED_OUTPATIENT_CLINIC_OR_DEPARTMENT_OTHER): Payer: Medicaid Other | Admitting: Physical Therapy

## 2021-05-20 DIAGNOSIS — M6281 Muscle weakness (generalized): Secondary | ICD-10-CM

## 2021-05-20 DIAGNOSIS — M79671 Pain in right foot: Secondary | ICD-10-CM

## 2021-05-20 DIAGNOSIS — R262 Difficulty in walking, not elsewhere classified: Secondary | ICD-10-CM

## 2021-05-20 DIAGNOSIS — M25674 Stiffness of right foot, not elsewhere classified: Secondary | ICD-10-CM

## 2021-05-20 DIAGNOSIS — M25671 Stiffness of right ankle, not elsewhere classified: Secondary | ICD-10-CM

## 2021-05-20 DIAGNOSIS — G8929 Other chronic pain: Secondary | ICD-10-CM

## 2021-05-20 DIAGNOSIS — M5136 Other intervertebral disc degeneration, lumbar region: Secondary | ICD-10-CM | POA: Diagnosis not present

## 2021-05-20 NOTE — Progress Notes (Incomplete)
***In Progress*** ? ?  ?Advanced Heart Failure Clinic Note  ? ?PCP: [name], MD ?PCP-Cardiologist: Fransico Him, MD  ?Neurology: Dr Posey Pronto ?HF Cardiology: Dr. Haroldine Laws ?  ?HPI: ?Russell Collier is a 61 y.o. 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  ?  ?Echo 01/2018, EF 55-60%, nonobstructive cardiac cath 01/2018 (20% prox LAD, 60% mid Cx, 50% post atrio lesion) h/o bradycardia w/ ? 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  ?  ?PYP scan 09/2018 and was an equivocal study. No visual increase in PYP uptake, but ratio is between 1-1.5. Based clinical suspicion, he was ordered to undergo genetic testing and results + for TTR gene. ?  ?He was seen by neurology for neuropathy associated with TTR.  ?  ?Admitted 07/27/2019 with atypical chest pain and wheezing.  Troponins were low and flat, EKG was reassuring.   A CTA was performed, which showed a possible segmental PE.  He was continued on IV heparin and transitioned to Eliquis on 07/28/2019 d/t hx of PE. Echo 07/27/2019 showed EF 55%, RV normal.  ?  ?At his HF follow up 12/2019 Eliquis decreased due to frequent nose bleeds and he reported his mobility limited by back and knee pain. ?  ?S/p L3-S1 decompression  in 03/2020, uneventful hospitalization at [facility]. ?  ?Graduated from paramedicine 09/2020. ?  ?Seen by neurology 03/2021, who recommended Amvuttra and PT which he started. ?  ?He was seen on 04/13/2021 for HF follow up where his main complaint was atypical chest pain, sharp, on-going for the past 2 months. He reported it felt similar to the pain he had when he had his stent placed. Reported some dizziness when he stood or bent over. Also reported palpitations nightly. He was  now more SOB walking further distances on flat ground. He used a rolling walker for balance and neuropathy. Denied abnormal bleeding, edema, or PND/Orthopnea. Appetite was ok. Denied fever or chills. He was not weighing at home. Reported adherence to all medications. Was not wearing CPAP. Still smoked 1/2 ppd. Reported no cocaine in 3 years. ? ?Cath on 04/16/2021 showed CAD with patent stent in LCx with tandem lesions in mid LAD. 2nd lesion in 60-70% range. EF 35-40% with normal filling pressure with evidence of RV dysfunction. Patch worn from 2/20-04/27/2021 showed SR with two runs of NSVT and one run of supraventricular tachycardia. ? ?Today he returns to HF clinic for pharmacist medication titration. At last visit with MD ***.  ? ?Overall feeling ***. ?Dizziness, lightheadedness, fatigue:  ?Chest pain or palpitations: ? ?How is your breathing?: *** ?SOB: ?Able to complete all ADLs. Activity level *** ? ?Weight at home pounds. Takes furosemide/torsemide/bumex *** mg *** daily.  ?LEE ?PND/Orthopnea ? ?Appetite *** ?Low-salt diet:  ? ?Physical Exam ?Cost/affordability of meds ? ? ?HF Medications: ?Enalapril 20 mg qhs ?Imdur 30 mg daily ?Farxiga 10 mg daily ?Furosemide 40 mg BID ?Kcl 20 mEq daily ? ?Has the patient been experiencing any side effects to the medications prescribed?  {YES NO:22349} ? ?Does the patient have any problems obtaining medications due to transportation or finances?   {YES NO:22349} ? ?Understanding of regimen: {excellent/good/fair/poor:19665} ?Understanding of indications: {excellent/good/fair/poor:19665} ?Potential of compliance: {excellent/good/fair/poor:19665} ?Patient understands to avoid NSAIDs. ?Patient understands to  avoid decongestants. ?  ? ?Pertinent Lab Values: ?04/13/2021 - Serum creatinine 1.31, BUN 8, Potassium 3.8, Sodium 140, BNP 43 ? ?Vital Signs: ?Weight: *** (last clinic weight: 308.6 lbs.) ?Blood pressure: ***  ?Heart rate: ***  ? ?Assessment/Plan: ?1. CAD ?- s/p DES to Russell Collier  (11/2018). ?- New atypical chest pain and increased dyspnea. ECG non-ischemic. ? Progression of CAD vs progression of amyloidosis. ?Cath on 04/16/2021 showed CAD with patent stent in LCx with tandem lesions in mid LAD. 2nd lesion in 60-70% range. EF 35-40% with normal filling pressure with evidence of RV dysfunction. ? ?- Continue Imdur 30 mg daily. No BP room to increase today. ?- Off ASA w/ Eliquis. ?- Update echo. ? ?  ?2. Familial TTR Cardiac Amyloidosis:  ?- PYP scan 09/2018 was equivocal. + genetic testing. Has TTR gene, heterozygous.  ?- He is on Tafamadis and referred for family genetic testing. No family members with TTR.  ?- Based on PYP scan not thought to have extensive cardiac involvement at this point.  ?- He is c/o numerous neuropathic symptoms. ?- Dr. Narda Amber in Neurology following for neuropathy, Pharmacy helping with Amvuttra.   ?- Continue Tafamadis. ?- Continue Amvuttra. ?- Consider repeat PYP. ?  ?3. Chronic Diastolic HF:  ?- Echo 64/3329 showed normal LVEF and G2DD.  ?- RHC in (11/2018): Mild PAH with normal PCWP ?- Echo (07/2019): EF 55%  ?- Worse NYHA III, functional status difficult due to obesity and back issues. ***Volume looks ok today on exam and by weight. ? ?- Continue Lasix 40 mg bid.  ?- Continue enalapril 20 mg qhs. ?- Off spiro due to gynecomastia. ***Will not restart due to low BP. ?- Continue Farxiga 10 mg daily.  ? ? ?- No beta blocker with history of pauses and bradycardia. ? ?  ?4. Palpitations ?- Having nightly palpitations. Not wearing CPAP. ?- Zio Patch (05/01/2019): showed brief tachycardia and < 1 % PVCs.  ?Zio Patch (04/2021): Sinus rhythm with 1 AVB - avg HR of 77 bpm. Two runs of NSVT and one run of Supraventricular Tachycardia occurred, Rare PVCs.  ? ?- No BB with history of 4.9 sec pause & bradycardia.  ? ?  ?5. ? PE ?- Dr. Haroldine Laws reviewed CT from 07/2019. No PE was visualized.  ?- Given h/o DVT/PE likely would benefit from long-term therapy.  ?- Continue Eliquis  2.5 mg bid, dose reduced due to frequent epistaxis  (Based on Amplify-EXT trial)  ?  ?6. OSA ?- Intolerant CPAP. Referred for Inspire device. ?- Needs formal sleep study in the lab.  ?- Followed by Dr. Radford Pax.  ?  ?7. Obesity ?Body mass index is 43.65 kg/m?.  ?- Would benefit with weight loss. ?  ?8. Tobacco use ?- Discussed need for cessation. Stated he was not ready to quit. ?- Still smoking 1/2 ppd. ?  ?9. DMII ?- On SGLT2i. ?  ?Follow up in 3 months with Dr. Haroldine Laws. ? ? ?Audry Riles, PharmD, BCPS, BCCP, CPP ?Heart Failure Clinic Pharmacist ?[phone] ? ? ?
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
## 2021-05-21 ENCOUNTER — Ambulatory Visit (HOSPITAL_COMMUNITY)
Admission: RE | Admit: 2021-05-21 | Discharge: 2021-05-21 | Disposition: A | Discharge status: Home / Self Care | Payer: Medicaid Other | Source: Ambulatory Visit | Attending: Family Medicine | Admitting: Family Medicine

## 2021-05-21 ENCOUNTER — Ambulatory Visit (HOSPITAL_BASED_OUTPATIENT_CLINIC_OR_DEPARTMENT_OTHER)
Admission: RE | Admit: 2021-05-21 | Discharge: 2021-05-21 | Disposition: A | Discharge status: Home / Self Care | Payer: Medicaid Other | Source: Ambulatory Visit | Attending: Family Medicine | Admitting: Family Medicine

## 2021-05-21 ENCOUNTER — Encounter (HOSPITAL_COMMUNITY): Payer: Self-pay

## 2021-05-21 VITALS — BP 110/70 | HR 63 | Wt 319.6 lb

## 2021-05-21 DIAGNOSIS — I43 Cardiomyopathy in diseases classified elsewhere: Secondary | ICD-10-CM | POA: Diagnosis not present

## 2021-05-21 DIAGNOSIS — Z955 Presence of coronary angioplasty implant and graft: Secondary | ICD-10-CM | POA: Diagnosis not present

## 2021-05-21 DIAGNOSIS — I5030 Unspecified diastolic (congestive) heart failure: Secondary | ICD-10-CM

## 2021-05-21 DIAGNOSIS — R002 Palpitations: Secondary | ICD-10-CM | POA: Insufficient documentation

## 2021-05-21 DIAGNOSIS — E854 Organ-limited amyloidosis: Secondary | ICD-10-CM

## 2021-05-21 DIAGNOSIS — Z6841 Body Mass Index (BMI) 40.0 and over, adult: Secondary | ICD-10-CM | POA: Insufficient documentation

## 2021-05-21 DIAGNOSIS — Z7901 Long term (current) use of anticoagulants: Secondary | ICD-10-CM | POA: Diagnosis not present

## 2021-05-21 DIAGNOSIS — I5033 Acute on chronic diastolic (congestive) heart failure: Secondary | ICD-10-CM | POA: Diagnosis not present

## 2021-05-21 DIAGNOSIS — I5032 Chronic diastolic (congestive) heart failure: Secondary | ICD-10-CM

## 2021-05-21 DIAGNOSIS — E785 Hyperlipidemia, unspecified: Secondary | ICD-10-CM | POA: Diagnosis not present

## 2021-05-21 DIAGNOSIS — I2510 Atherosclerotic heart disease of native coronary artery without angina pectoris: Secondary | ICD-10-CM | POA: Diagnosis not present

## 2021-05-21 DIAGNOSIS — Z09 Encounter for follow-up examination after completed treatment for conditions other than malignant neoplasm: Secondary | ICD-10-CM | POA: Insufficient documentation

## 2021-05-21 DIAGNOSIS — G4733 Obstructive sleep apnea (adult) (pediatric): Secondary | ICD-10-CM

## 2021-05-21 DIAGNOSIS — R0789 Other chest pain: Secondary | ICD-10-CM | POA: Insufficient documentation

## 2021-05-21 DIAGNOSIS — Z79899 Other long term (current) drug therapy: Secondary | ICD-10-CM | POA: Diagnosis not present

## 2021-05-21 DIAGNOSIS — I10 Essential (primary) hypertension: Secondary | ICD-10-CM | POA: Diagnosis not present

## 2021-05-21 DIAGNOSIS — Z86711 Personal history of pulmonary embolism: Secondary | ICD-10-CM | POA: Diagnosis not present

## 2021-05-21 DIAGNOSIS — J9601 Acute respiratory failure with hypoxia: Secondary | ICD-10-CM | POA: Diagnosis not present

## 2021-05-21 DIAGNOSIS — Z86718 Personal history of other venous thrombosis and embolism: Secondary | ICD-10-CM | POA: Insufficient documentation

## 2021-05-21 DIAGNOSIS — I472 Ventricular tachycardia, unspecified: Secondary | ICD-10-CM | POA: Diagnosis not present

## 2021-05-21 DIAGNOSIS — G629 Polyneuropathy, unspecified: Secondary | ICD-10-CM | POA: Insufficient documentation

## 2021-05-21 DIAGNOSIS — I493 Ventricular premature depolarization: Secondary | ICD-10-CM | POA: Diagnosis not present

## 2021-05-21 DIAGNOSIS — F17210 Nicotine dependence, cigarettes, uncomplicated: Secondary | ICD-10-CM | POA: Diagnosis not present

## 2021-05-21 DIAGNOSIS — M5136 Other intervertebral disc degeneration, lumbar region: Secondary | ICD-10-CM | POA: Diagnosis not present

## 2021-05-21 DIAGNOSIS — I110 Hypertensive heart disease with heart failure: Secondary | ICD-10-CM | POA: Diagnosis not present

## 2021-05-21 DIAGNOSIS — E851 Neuropathic heredofamilial amyloidosis: Secondary | ICD-10-CM

## 2021-05-21 LAB — ECHOCARDIOGRAM COMPLETE
Area-P 1/2: 4.29 cm2
Calc EF: 51.4 %
S' Lateral: 3.6 cm
Single Plane A2C EF: 54.8 %
Single Plane A4C EF: 48.8 %

## 2021-05-21 LAB — BASIC METABOLIC PANEL
Anion gap: 7 (ref 5–15)
BUN: 15 mg/dL (ref 8–23)
CO2: 24 mmol/L (ref 22–32)
Calcium: 8.6 mg/dL — ABNORMAL LOW (ref 8.9–10.3)
Chloride: 107 mmol/L (ref 98–111)
Creatinine, Ser: 1.19 mg/dL (ref 0.61–1.24)
GFR, Estimated: >60 mL/min (ref >60)
Glucose, Bld: 88 mg/dL (ref 70–99)
Potassium: 4 mmol/L (ref 3.5–5.1)
Sodium: 138 mmol/L (ref 135–145)

## 2021-05-21 LAB — BRAIN NATRIURETIC PEPTIDE: B Natriuretic Peptide: 56 pg/mL (ref 0.0–100.0)

## 2021-05-21 MED ORDER — POTASSIUM CHLORIDE CRYS ER 20 MEQ PO TBCR: 40.0000 meq | EXTENDED_RELEASE_TABLET | Freq: Every day | ORAL | 6 refills | Status: DC

## 2021-05-21 MED ORDER — VUTRISIRAN SODIUM 25 MG/0.5ML ~~LOC~~ SOSY
25.0000 mg | PREFILLED_SYRINGE | Freq: Once | SUBCUTANEOUS | Status: AC
  Administered 2021-05-21: 25 mg via SUBCUTANEOUS
  Filled 2021-05-21: qty 0.5

## 2021-05-21 MED ORDER — FUROSEMIDE 40 MG PO TABS: 60.0000 mg | ORAL_TABLET | Freq: Two times a day (BID) | ORAL | 3 refills | Status: DC

## 2021-05-21 MED ORDER — VITAMIN A 2400 MCG (8000 UT) PO TABS: 1.0000 | ORAL_TABLET | Freq: Every day | ORAL | Status: AC

## 2021-05-21 NOTE — Progress Notes (Signed)
?  ?  Advanced Heart Failure Clinic Note  ? ?PCP: [name], MD ?PCP-Cardiologist: Fransico Him, MD  ?Neurology: Dr Posey Pronto ?HF Cardiology: Dr. Haroldine Laws ? ?HPI:  ?[name] is a 61 y.o. 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  ?  ?Echo 01/2018, EF 55-60%, nonobstructive cardiac cath 01/2018 (20% prox LAD, 60% mid Cx, 50% post atrio lesion) h/o bradycardia w/ ? 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  ?  ?PYP scan 09/2018 and was an equivocal study. No visual increase in PYP uptake, but ratio was between 1-1.5. Based clinical suspicion, he was ordered to undergo genetic testing and results + for TTR gene. ?  ?He was seen by neurology, neuropathy associated with TTR.  ?  ?Admitted 07/27/19 with atypical chest pain and wheezing.  Troponins were low and flat, EKG reassuring.   A CTA was performed, which showed a possible segmental PE.  Since he does have a history of PE, he was continued on IV heparin and transitioned to Eliquis on 07/28/19. Echo 07/27/19 EF 55% RV normal.  ?  ?At his HF follow up 12/2019 Eliquis was decreased due to frequent nose bleeds, mobility limited by back and knee pain. ?  ?S/p L3-S1 decompression 03/2020, uneventful hospitalization at [facility]. ?  ?Graduated from paramedicine 09/2020. ?  ?Followed up with Neurology 03/2021. He main complaint was that for the past few months he has not been able to flex his right toe completely.  He also had numbness/tingling in the heel and sole of the right foot. He denied similar symptoms in the left foot.  He reported needle pick sensation in both feet.  He uses a cane and walker for long distance, but at home walks unassisted. No recent falls. He takes Lyrica 100  mg three times daily for pain. His bilateral CTS had been stable, no worsening symptoms. He reported using a brace as needed. At that visit, it was recommended he start [place] and recommended restarting PT. ? ?Today he returns to HF clinic for his first dose of Amvuttra. Overall feeling well today. No contraindications to injection. ? ? ?Assessment/Plan: ?1. Familial TTR Cardiac Amyloidosis:  ?- PYP scan 09/2018 was equivocal. + genetic testing. Has TTR gene, heterozygous.  ?- He is on tafamadis and referred for family genetic testing. No family members with TTR.  ?- Based on PYP scan not thought to have extensive cardiac involvement at this point.  ?- He has numberous neuropathic symptoms. Followed by Dr. Narda Amber in Neurology for neuropathy ?- Continue Tafamadis. 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 Patient tolerated injection well. Provided patient counseling on Amvuttra. Most common side effects are injection site reactions, arthralgias, dyspnea and vitamin A deficiency. Patient is aware to return to clinic every 3 months for repeat injection.   ?- Start vitamin A supplement 8000 IU daily. Amvuttra decreases serum vitamin A levels. ? ?Follow up 3 months for repeat Amvuttra injection.  ? ? ?Audry Riles, PharmD, BCPS, BCCP, CPP ?Heart Failure Clinic Pharmacist ?[phone] ?  ?

## 2021-05-21 NOTE — Patient Instructions (Signed)
It was a pleasure seeing you today! ? ?MEDICATIONS: ?-Start vitamin A supplement 8000 IU (2400 mcg) daily. ? ? ?NEXT APPOINTMENT: ?Return to clinic in 3 months for repeat injection of Amvuttra. ? ?In general, to take care of your heart failure: ?-Limit your fluid intake to 2 Liters (half-gallon) per day.   ?-Limit your salt intake to ideally 2-3 grams (2000-3000 mg) per day. ?-Weigh yourself daily and record, and bring that "weight diary" to your next appointment.  (Weight gain of 2-3 pounds in 1 day typically means fluid weight.) ?-The medications for your heart are to help your heart and help you live longer.   ?-Please contact us before stopping any of your heart medications. ? ?Call the clinic at [phone] with questions or to reschedule future appointments.  ?
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
## 2021-05-21 NOTE — Patient Instructions (Addendum)
INCREASE Lasix 60 mg one and one half tab twice a day ?INCREASE Potassium 40 meq daily ? ? ?Labs today ?We will only contact you if something comes back abnormal or we need to make some changes. ?Otherwise no news is good news! ? ?Your physician recommends that you schedule a follow-up appointment in: 7-10 days  in the Advanced Practitioners (PA/NP) Clinic  ? ? ? ?Do the following things EVERYDAY: ?Weigh yourself in the morning before breakfast. Write it down and keep it in a log. ?Take your medicines as prescribed ?Eat low salt foods--Limit salt (sodium) to 2000 mg per day.  ?Stay as active as you can everyday ?Limit all fluids for the day to less than 2 liters ? ?At the [facility], you and your health needs are our priority. As part of our continuing mission to provide you with exceptional heart care, we have created designated Provider Care Teams. These Care Teams include your primary Cardiologist (physician) and Advanced Practice Providers (APPs- Physician Assistants and Nurse Practitioners) who all work together to provide you with the care you need, when you need it.  ? ?You may see any of the following providers on your designated Care Team at your next follow up: ?Dr Glori Bickers ?Dr Loralie Champagne ?Darrick Grinder, NP ?Lyda Jester, PA ?Jessica Milford,NP ?Marlyce Huge, PA ?Audry Riles, PharmD ? ? ?Please be sure to bring in all your medications bottles to every appointment.  ? ?If you have any questions or concerns before your next appointment please send [place] a message through [place] or call our office at [phone].   ? ?TO LEAVE A MESSAGE FOR THE NURSE SELECT OPTION 2, PLEASE LEAVE A MESSAGE INCLUDING: ?YOUR NAME ?DATE OF BIRTH ?CALL BACK NUMBER ?REASON FOR CALL**this is important as we prioritize the call backs ? ?YOU WILL RECEIVE A CALL BACK THE SAME DAY AS LONG AS YOU CALL BEFORE 4:00 PM ? ? ?

## 2021-05-22 DIAGNOSIS — M5136 Other intervertebral disc degeneration, lumbar region: Secondary | ICD-10-CM | POA: Diagnosis not present

## 2021-05-25 ENCOUNTER — Encounter (HOSPITAL_BASED_OUTPATIENT_CLINIC_OR_DEPARTMENT_OTHER): Payer: Self-pay | Admitting: Physical Therapy

## 2021-05-25 ENCOUNTER — Ambulatory Visit (HOSPITAL_BASED_OUTPATIENT_CLINIC_OR_DEPARTMENT_OTHER): Payer: Medicaid Other | Attending: Family Medicine | Admitting: Physical Therapy

## 2021-05-25 DIAGNOSIS — M25671 Stiffness of right ankle, not elsewhere classified: Secondary | ICD-10-CM | POA: Diagnosis present

## 2021-05-25 DIAGNOSIS — M79671 Pain in right foot: Secondary | ICD-10-CM | POA: Insufficient documentation

## 2021-05-25 DIAGNOSIS — M5136 Other intervertebral disc degeneration, lumbar region: Secondary | ICD-10-CM | POA: Diagnosis not present

## 2021-05-25 DIAGNOSIS — M5459 Other low back pain: Secondary | ICD-10-CM | POA: Diagnosis present

## 2021-05-25 DIAGNOSIS — M6281 Muscle weakness (generalized): Secondary | ICD-10-CM | POA: Insufficient documentation

## 2021-05-25 DIAGNOSIS — G8929 Other chronic pain: Secondary | ICD-10-CM | POA: Diagnosis present

## 2021-05-25 DIAGNOSIS — M545 Low back pain, unspecified: Secondary | ICD-10-CM | POA: Diagnosis present

## 2021-05-25 DIAGNOSIS — R262 Difficulty in walking, not elsewhere classified: Secondary | ICD-10-CM | POA: Insufficient documentation

## 2021-05-25 DIAGNOSIS — M25674 Stiffness of right foot, not elsewhere classified: Secondary | ICD-10-CM | POA: Insufficient documentation
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
## 2021-05-26 ENCOUNTER — Other Ambulatory Visit: Payer: Self-pay

## 2021-05-26 ENCOUNTER — Encounter (HOSPITAL_COMMUNITY): Payer: Self-pay

## 2021-05-26 ENCOUNTER — Emergency Department (HOSPITAL_COMMUNITY): Payer: Medicaid Other

## 2021-05-26 ENCOUNTER — Emergency Department (HOSPITAL_COMMUNITY)
Admission: EM | Admit: 2021-05-26 | Discharge: 2021-05-26 | Disposition: A | Discharge status: Home / Self Care | Payer: Medicaid Other | Attending: Emergency Medicine | Admitting: Emergency Medicine

## 2021-05-26 ENCOUNTER — Telehealth (HOSPITAL_COMMUNITY): Payer: Self-pay | Admitting: *Deleted

## 2021-05-26 DIAGNOSIS — M5136 Other intervertebral disc degeneration, lumbar region: Secondary | ICD-10-CM | POA: Diagnosis not present

## 2021-05-26 DIAGNOSIS — J449 Chronic obstructive pulmonary disease, unspecified: Secondary | ICD-10-CM | POA: Insufficient documentation

## 2021-05-26 DIAGNOSIS — I2510 Atherosclerotic heart disease of native coronary artery without angina pectoris: Secondary | ICD-10-CM | POA: Diagnosis not present

## 2021-05-26 DIAGNOSIS — Z7901 Long term (current) use of anticoagulants: Secondary | ICD-10-CM | POA: Diagnosis not present

## 2021-05-26 DIAGNOSIS — I509 Heart failure, unspecified: Secondary | ICD-10-CM | POA: Diagnosis not present

## 2021-05-26 DIAGNOSIS — R079 Chest pain, unspecified: Secondary | ICD-10-CM | POA: Diagnosis not present

## 2021-05-26 DIAGNOSIS — R0789 Other chest pain: Secondary | ICD-10-CM | POA: Insufficient documentation

## 2021-05-26 LAB — BASIC METABOLIC PANEL
Anion gap: 6 (ref 5–15)
BUN: 14 mg/dL (ref 8–23)
CO2: 27 mmol/L (ref 22–32)
Calcium: 8.9 mg/dL (ref 8.9–10.3)
Chloride: 107 mmol/L (ref 98–111)
Creatinine, Ser: 1.31 mg/dL — ABNORMAL HIGH (ref 0.61–1.24)
GFR, Estimated: >60 mL/min (ref >60)
Glucose, Bld: 113 mg/dL — ABNORMAL HIGH (ref 70–99)
Potassium: 4 mmol/L (ref 3.5–5.1)
Sodium: 140 mmol/L (ref 135–145)

## 2021-05-26 LAB — CBC
HCT: 40.9 % (ref 39.0–52.0)
Hemoglobin: 13.2 g/dL (ref 13.0–17.0)
MCH: 30 pg (ref 26.0–34.0)
MCHC: 32.3 g/dL (ref 30.0–36.0)
MCV: 93 fL (ref 80.0–100.0)
Platelets: 263 10*3/uL (ref 150–400)
RBC: 4.4 MIL/uL (ref 4.22–5.81)
RDW: 16.1 % — ABNORMAL HIGH (ref 11.5–15.5)
WBC: 9.1 10*3/uL (ref 4.0–10.5)
nRBC: 0 % (ref 0.0–0.2)

## 2021-05-26 LAB — TROPONIN I (HIGH SENSITIVITY)
Troponin I (High Sensitivity): 14 ng/L (ref <18)
Troponin I (High Sensitivity): 12 ng/L (ref <18)

## 2021-05-26 NOTE — ED Triage Notes (Signed)
PER EMS: pt from home with c/o chest pain x 3 days, says he feels like it is a side effect from the IM Buspar injections he receives Q2 months from Cardiologist. Denies current chest pain or SOB. 18g LFA. ?BP-126/90, HR-70s, RR-18 ?

## 2021-05-26 NOTE — ED Provider Notes (Signed)
?[place] ?Provider Note ? ? ?CSN: [id number] ?Arrival date & time: 05/26/21  1553 ? ?  ? ?History ? ?Chief Complaint  ?Patient presents with  ? Chest Pain  ? ? ?Russell Collier is a 61 y.o. male. ? ?61 year old male with prior medical history of CHF, COPD, CAD presents complaint of atypical chest discomfort.  Patient reports intermittent sharp nonradiating diffuse pain.  Symptoms been ongoing for at least the last few months. ? ?Patient denies significant discomfort during his ED evaluation. ?He denies associated shortness of breath or nausea.  He denies fever. ? ? ?The history is provided by the patient and medical records.  ?Chest Pain ?Pain location:  Unable to specify ?Pain quality: sharp   ?Pain radiates to:  Does not radiate ?Pain severity:  No pain ?Onset quality:  Unable to specify ?Duration:  3 months ?Timing:  Intermittent ?Progression:  Waxing and waning ? ?  ? ?Home Medications ?Prior to Admission medications   ?Medication Sig Start Date End Date Taking? Authorizing Provider  ?albuterol (PROVENTIL) (2.5 MG/3ML) 0.083% nebulizer solution Take 3 mLs (2.5 mg total) by nebulization every 6 (six) hours as needed for wheezing or shortness of breath. 11/20/20  Yes [name], MD  ?albuterol (VENTOLIN HFA) 108 (90 Base) MCG/ACT inhaler Inhale 2 puffs into the lungs every 6 (six) hours as needed for wheezing or shortness of breath. 11/20/20  Yes [name], MD  ?Jearl Klinefelter ELLIPTA 62.5-25 MCG/ACT AEPB INHALE 1 PUFF INTO THE LUNGS DAILY AS NEEDED. ?Patient taking differently: 1 puff daily as needed (sob/wheezing). 05/19/21  Yes [name], MD  ?apixaban (ELIQUIS) 2.5 MG TABS tablet Take 1 tablet (2.5 mg total) by mouth 2 (two) times daily. 11/20/20  Yes [name], MD  ?atorvastatin (LIPITOR) 80 MG tablet Take 1 tablet (80 mg total) by mouth daily. 11/20/20  Yes [name], MD  ?dapagliflozin propanediol (FARXIGA) 10 MG TABS tablet Take 1 tablet (10 mg  total) by mouth daily before breakfast. 11/20/20  Yes [name], MD  ?enalapril (VASOTEC) 20 MG tablet Take 1 tablet (20 mg total) by mouth at bedtime. 11/20/20  Yes [name], MD  ?fluticasone (FLONASE) 50 MCG/ACT nasal spray Place 1 spray into both nostrils daily. ?Patient taking differently: Place 1 spray into both nostrils daily as needed for allergies. 11/20/20  Yes [name], MD  ?furosemide (LASIX) 40 MG tablet Take 1.5 tablets (60 mg total) by mouth 2 (two) times daily. 05/21/21  Yes Lyda Jester M, PA-C  ?ibuprofen (ADVIL) 200 MG tablet Take 800 mg by mouth every 6 (six) hours as needed for moderate pain.   Yes [provider]  ?isosorbide mononitrate (IMDUR) 30 MG 24 hr tablet Take 1 tablet (30 mg total) by mouth daily. 11/20/20  Yes [name], MD  ?Multiple Vitamin (MULTIVITAMIN WITH MINERALS) TABS tablet Take 1 tablet by mouth daily.   Yes [provider]  ?pantoprazole (PROTONIX) 40 MG tablet TAKE 1 TABLET (40 MG TOTAL) BY MOUTH DAILY. 05/19/21  Yes [name], MD  ?potassium chloride SA (KLOR-CON M) 20 MEQ tablet Take 2 tablets (40 mEq total) by mouth daily. 05/21/21  Yes Lyda Jester M, PA-C  ?pregabalin (LYRICA) 100 MG capsule TAKE ONE CAPSULE BY MOUTH THREE TIMES DAILY 04/23/21  Yes [name], MD  ?Tafamidis (VYNDAMAX) 61 MG CAPS TAKE 1 CAPSULE BY MOUTH DAILY. 12/01/20 12/01/21 Yes [name], [name], MD  ?triamcinolone ointment (KENALOG) 0.1 % Apply 1 application topically 2 (two) times daily.  03/12/21  Yes Alcus Dad, MD  ?valACYclovir (VALTREX) 500 MG tablet Take 1 tablet (500 mg total) by mouth daily. 11/20/20  Yes [name], MD  ?vutrisiran sodium (AMVUTTRA) 25 MG/0.5ML syringe Inject 0.5 mLs (25 mg total) into the skin every 3 (three) months. 05/01/21  Yes [name], [name], MD  ?Accu-Chek Softclix Lancets lancets Use as instructed 11/20/20   [name], MD  ?Blood Glucose Monitoring Suppl (ACCU-CHEK GUIDE) w/Device  KIT 1 Units by Does not apply route daily. 11/20/20   [name], MD  ?glucose blood (ACCU-CHEK GUIDE) test strip Use as instructed 11/20/20   [name], MD  ?Vitamin A 2400 MCG (8000 UT) TABS Take 1 tablet by mouth daily. 05/21/21   [name], [name], MD  ?   ? ?Allergies    ?Varenicline, Bupropion, and Other   ? ?Review of Systems   ?Review of Systems  ?Cardiovascular:  Positive for chest pain.  ?All other systems reviewed and are negative. ? ?Physical Exam ?Updated Vital Signs ?BP 108/66   Pulse 60   Temp 98.2 ?F (36.8 ?C) (Oral)   Resp 19   SpO2 97%  ?Physical Exam ?Vitals and nursing note reviewed.  ?Constitutional:   ?   General: He is not in acute distress. ?   Appearance: Normal appearance. He is well-developed.  ?HENT:  ?   Head: Normocephalic and atraumatic.  ?Eyes:  ?   Conjunctiva/sclera: Conjunctivae normal.  ?   Pupils: Pupils are equal, round, and reactive to light.  ?Cardiovascular:  ?   Rate and Rhythm: Normal rate and regular rhythm.  ?   Heart sounds: Normal heart sounds.  ?Pulmonary:  ?   Effort: Pulmonary effort is normal. No respiratory distress.  ?   Breath sounds: Normal breath sounds.  ?Abdominal:  ?   General: There is no distension.  ?   Palpations: Abdomen is soft.  ?   Tenderness: There is no abdominal tenderness.  ?Musculoskeletal:     ?   General: No deformity. Normal range of motion.  ?   Cervical back: Normal range of motion and neck supple.  ?Skin: ?   General: Skin is warm and dry.  ?Neurological:  ?   General: No focal deficit present.  ?   Mental Status: He is alert and oriented to person, place, and time.  ? ? ?ED Results / Procedures / Treatments   ?Labs ?(all labs ordered are listed, but only abnormal results are displayed) ?Labs Reviewed  ?BASIC METABOLIC PANEL - Abnormal; Notable for the following components:  ?    Result Value  ? Glucose, Bld 113 (*)   ? Creatinine, Ser 1.31 (*)   ? All other components within normal limits  ?CBC - Abnormal; Notable for the  following components:  ? RDW 16.1 (*)   ? All other components within normal limits  ?TROPONIN I (HIGH SENSITIVITY)  ?TROPONIN I (HIGH SENSITIVITY)  ? ? ?EKG ?EKG Interpretation ? ?Date/Time:  Tuesday May 26 2021 21:07:27 EDT ?Ventricular Rate:  64 ?PR Interval:  223 ?QRS Duration: 143 ?QT Interval:  440 ?QTC Calculation: 793 ?R Axis:   -36 ?Text Interpretation: Sinus rhythm Prolonged PR interval Left bundle branch block Confirmed by Dene Gentry [phone]) on 05/26/2021 9:54:56 PM ? ?Radiology ?DG Chest 2 View ? ?Result Date: 05/26/2021 ?CLINICAL DATA:  Chest pain EXAM: CHEST - 2 VIEW COMPARISON:  Chest radiograph dated December 04, 2021 FINDINGS: The heart size and mediastinal contours are within normal limits. Coarse interstitial markings,  unchanged. No focal consolidation or pleural effusion. The visualized skeletal structures are unremarkable. IMPRESSION: No active cardiopulmonary disease. Electronically Signed   By: Keane Police D.O.   On: 05/26/2021 17:01   ? ?Procedures ?Procedures  ? ? ?Medications Ordered in ED ?Medications - No data to display ? ?ED Course/ Medical Decision Making/ A&P ?  ?                        ?Medical Decision Making ?Amount and/or Complexity of Data Reviewed ?Labs: ordered. ?Radiology: ordered. ? ? ? ?Medical Screen Complete ? ?This patient presented to the ED with complaint of chest discomfort, atypical. ? ?This complaint involves an extensive number of treatment options. The initial differential diagnosis includes, but is not limited to, ACS, metabolic abnormality, etc. ? ?This presentation is: Acute, Chronic, Self-Limited, Previously Undiagnosed, Uncertain Prognosis, Complicated, Systemic Symptoms, and Threat to Life/Bodily Function ? ?Patient is presenting with highly atypical chest discomfort. ? ?EKG is without evidence of acute ischemia ? ?Troponin is 14 and then 12 on recheck. ? ?Screening labs obtained are without significant abnormality. ? ?Patient's describe symptoms are not  consistent with likely ACS or angina. ? ?Patient does have established cardiology care in the outpatient setting.  He does understand need for close follow-up.  Strict return precautions given and understood.

## 2021-05-26 NOTE — ED Notes (Signed)
No response x2 ?

## 2021-05-26 NOTE — ED Provider Triage Note (Signed)
Emergency Medicine Provider Triage Evaluation Note ? ?Devarious Pavek , a 61 y.o. male with history of CHF, COPD, CAD s/p stents was evaluated in triage.  Pt complains of chest pain that has been on and off for a few months but seem to worsen in the last 3 days.  He describes the pain as sharp, intermittent and nonradiating.  Patient was seen by his cardiologist 5 days ago where he had echocardiogram done with normal ejection fraction of 50 to 55%.  Patient is recently started IM BuSpar injections and believes that his BuSpar injection is what is instigating his current symptoms. ? ?Review of Systems  ?Positive: Chest pain ?Negative: Abdominal pain, nausea, vomiting, diarrhea, headache, numbness and tingling ?Physical Exam  ?BP 100/60 (BP Location: Left Arm)   Pulse 77   Temp 98.6 ?F (37 ?C) (Oral)   Resp 16   SpO2 98%  ?Gen:   Awake, no distress   ?Resp:  Normal effort  ?MSK:   Moves extremities without difficulty  ?Other:   ? ?Medical Decision Making  ?Medically screening exam initiated at 4:47 PM.  Appropriate orders placed.  Jozeph Persing was informed that the remainder of the evaluation will be completed by another provider, this initial triage assessment does not replace that evaluation, and the importance of remaining in the ED until their evaluation is complete. ? ? ?  ?Tonye Pearson, [place] ?05/26/21 1650 ? ?

## 2021-05-26 NOTE — Telephone Encounter (Signed)
Pt left vm stating he was having severe chest pain. Called pt back he said the chest pain comes and goes. Pt described the pain as sharp left sided chest pain. Pt said when he has the sharp pain in his chest it is almost unbearable. Pt said chest hurts worse when he takes  a deep breath in. Pt said he is also having neck pain.  Pt c/o headache as well since taking Amvuttra injection. Pt advised to go to the emergency room for chest pain. Pt agreeable.  ?

## 2021-05-26 NOTE — ED Notes (Signed)
Discharge instructions reviewed with patient. Patient verbalized understanding of instructions. Follow-up care and medications were reviewed. Patient ambulatory with steady gait. VSS upon discharge.  ?

## 2021-05-26 NOTE — Discharge Instructions (Signed)
Return for any problem.  ?

## 2021-05-27 ENCOUNTER — Telehealth: Payer: Self-pay

## 2021-05-27 ENCOUNTER — Ambulatory Visit (HOSPITAL_BASED_OUTPATIENT_CLINIC_OR_DEPARTMENT_OTHER): Payer: Medicaid Other | Admitting: Physical Therapy

## 2021-05-27 ENCOUNTER — Telehealth: Payer: Self-pay | Admitting: Pharmacist

## 2021-05-27 ENCOUNTER — Encounter (HOSPITAL_BASED_OUTPATIENT_CLINIC_OR_DEPARTMENT_OTHER): Payer: Self-pay | Admitting: Physical Therapy

## 2021-05-27 DIAGNOSIS — M79671 Pain in right foot: Secondary | ICD-10-CM

## 2021-05-27 DIAGNOSIS — M5136 Other intervertebral disc degeneration, lumbar region: Secondary | ICD-10-CM | POA: Diagnosis not present

## 2021-05-27 DIAGNOSIS — M25674 Stiffness of right foot, not elsewhere classified: Secondary | ICD-10-CM

## 2021-05-27 DIAGNOSIS — M25671 Stiffness of right ankle, not elsewhere classified: Secondary | ICD-10-CM

## 2021-05-27 DIAGNOSIS — M6281 Muscle weakness (generalized): Secondary | ICD-10-CM

## 2021-05-27 NOTE — Telephone Encounter (Signed)
Attempted to contact patient for follow-up of tobacco intake reduction / cessation.  ? ? ?Left HIPAA compliant voice mail  ?Total time with patient call and documentation of interaction: 5 minutes. ? ?Additional F/U Phone call planned: 2 weeks ? ?
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
## 2021-05-27 NOTE — Telephone Encounter (Signed)
-----   Message from Leavy Cella, [place] sent at 04/17/2021 12:33 PM EST ----- ?Regarding: Reduced to ~ 10 per day - plans to quit cold [place] asked for 30 day follow-up ? ? ?

## 2021-05-27 NOTE — Telephone Encounter (Signed)
Transition Care Management Follow-up Telephone Call ?Date of discharge and from where: 05/27/2021-[place]  ?How have you been since you were released from the hospital? Pt stated he is doing excellent.  ?Any questions or concerns? No ? ?Items Reviewed: ?Did the pt receive and understand the discharge instructions provided? Yes  ?Medications obtained and verified? Yes  ?Other? No  ?Any new allergies since your discharge? No  ?Dietary orders reviewed? No ?Do you have support at home? Yes  ? ?Home Care and Equipment/Supplies: ?Were home health services ordered? not applicable ?If so, what is the name of the agency? N/A  ?Has the agency set up a time to come to the patient's home? not applicable ?Were any new equipment or medical supplies ordered?  No ?What is the name of the medical supply agency? N/A ?Were you able to get the supplies/equipment? not applicable ?Do you have any questions related to the use of the equipment or supplies? No ? ?Functional Questionnaire: (I = Independent and D = Dependent) ?ADLs: I ? ?Bathing/Dressing- I ? ?Meal Prep- I ? ?Eating- I ? ?Maintaining continence- I ? ?Transferring/Ambulation- I ? ?Managing Meds- I ? ?Follow up appointments reviewed: ? ?PCP Hospital f/u appt confirmed? No  confirmed? No   ?Are transportation arrangements needed? No  ?If their condition worsens, is the pt aware to call PCP or go to the Emergency Dept.? Yes ?Was the patient provided with contact information for the PCP's office or ED? Yes ?Was to pt encouraged to call back with questions or concerns? Yes  ?

## 2021-05-28 ENCOUNTER — Other Ambulatory Visit (HOSPITAL_COMMUNITY): Payer: Self-pay

## 2021-05-28 ENCOUNTER — Ambulatory Visit (INDEPENDENT_AMBULATORY_CARE_PROVIDER_SITE_OTHER): Payer: Medicaid Other | Admitting: Plastic Surgery

## 2021-05-28 VITALS — BP 123/71 | HR 103 | Ht 70.5 in | Wt 308.4 lb

## 2021-05-28 DIAGNOSIS — L723 Sebaceous cyst: Secondary | ICD-10-CM | POA: Diagnosis not present

## 2021-05-28 DIAGNOSIS — M5136 Other intervertebral disc degeneration, lumbar region: Secondary | ICD-10-CM | POA: Diagnosis not present
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
## 2021-05-29 DIAGNOSIS — M5136 Other intervertebral disc degeneration, lumbar region: Secondary | ICD-10-CM | POA: Diagnosis not present

## 2021-06-01 ENCOUNTER — Encounter (HOSPITAL_BASED_OUTPATIENT_CLINIC_OR_DEPARTMENT_OTHER): Payer: Self-pay | Admitting: Physical Therapy

## 2021-06-01 ENCOUNTER — Other Ambulatory Visit (HOSPITAL_COMMUNITY): Payer: Self-pay

## 2021-06-01 ENCOUNTER — Ambulatory Visit (HOSPITAL_BASED_OUTPATIENT_CLINIC_OR_DEPARTMENT_OTHER): Payer: Medicaid Other | Admitting: Physical Therapy

## 2021-06-01 DIAGNOSIS — M79671 Pain in right foot: Secondary | ICD-10-CM

## 2021-06-01 DIAGNOSIS — G8929 Other chronic pain: Secondary | ICD-10-CM

## 2021-06-01 DIAGNOSIS — M5136 Other intervertebral disc degeneration, lumbar region: Secondary | ICD-10-CM | POA: Diagnosis not present

## 2021-06-01 DIAGNOSIS — R262 Difficulty in walking, not elsewhere classified: Secondary | ICD-10-CM

## 2021-06-01 DIAGNOSIS — M25671 Stiffness of right ankle, not elsewhere classified: Secondary | ICD-10-CM

## 2021-06-01 DIAGNOSIS — M6281 Muscle weakness (generalized): Secondary | ICD-10-CM

## 2021-06-01 DIAGNOSIS — M25674 Stiffness of right foot, not elsewhere classified: Secondary | ICD-10-CM
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
## 2021-06-02 ENCOUNTER — Ambulatory Visit (HOSPITAL_COMMUNITY)
Admission: RE | Admit: 2021-06-02 | Discharge: 2021-06-02 | Disposition: A | Discharge status: Home / Self Care | Payer: Medicaid Other | Source: Ambulatory Visit | Attending: Internal Medicine | Admitting: Internal Medicine

## 2021-06-02 ENCOUNTER — Encounter (HOSPITAL_COMMUNITY): Payer: Self-pay

## 2021-06-02 VITALS — BP 110/70 | HR 74 | Wt 308.0 lb

## 2021-06-02 DIAGNOSIS — E854 Organ-limited amyloidosis: Secondary | ICD-10-CM | POA: Diagnosis not present

## 2021-06-02 DIAGNOSIS — G629 Polyneuropathy, unspecified: Secondary | ICD-10-CM | POA: Diagnosis not present

## 2021-06-02 DIAGNOSIS — Z955 Presence of coronary angioplasty implant and graft: Secondary | ICD-10-CM | POA: Diagnosis not present

## 2021-06-02 DIAGNOSIS — Z09 Encounter for follow-up examination after completed treatment for conditions other than malignant neoplasm: Secondary | ICD-10-CM | POA: Diagnosis not present

## 2021-06-02 DIAGNOSIS — I25110 Atherosclerotic heart disease of native coronary artery with unstable angina pectoris: Secondary | ICD-10-CM | POA: Diagnosis not present

## 2021-06-02 DIAGNOSIS — I5032 Chronic diastolic (congestive) heart failure: Secondary | ICD-10-CM

## 2021-06-02 DIAGNOSIS — Z79899 Other long term (current) drug therapy: Secondary | ICD-10-CM | POA: Insufficient documentation

## 2021-06-02 DIAGNOSIS — I110 Hypertensive heart disease with heart failure: Secondary | ICD-10-CM | POA: Diagnosis not present

## 2021-06-02 DIAGNOSIS — R0789 Other chest pain: Secondary | ICD-10-CM | POA: Insufficient documentation

## 2021-06-02 DIAGNOSIS — E119 Type 2 diabetes mellitus without complications: Secondary | ICD-10-CM

## 2021-06-02 DIAGNOSIS — Z86711 Personal history of pulmonary embolism: Secondary | ICD-10-CM | POA: Diagnosis not present

## 2021-06-02 DIAGNOSIS — G4733 Obstructive sleep apnea (adult) (pediatric): Secondary | ICD-10-CM

## 2021-06-02 DIAGNOSIS — F17210 Nicotine dependence, cigarettes, uncomplicated: Secondary | ICD-10-CM | POA: Insufficient documentation

## 2021-06-02 DIAGNOSIS — I43 Cardiomyopathy in diseases classified elsewhere: Secondary | ICD-10-CM | POA: Diagnosis not present

## 2021-06-02 DIAGNOSIS — Z86718 Personal history of other venous thrombosis and embolism: Secondary | ICD-10-CM

## 2021-06-02 DIAGNOSIS — I2510 Atherosclerotic heart disease of native coronary artery without angina pectoris: Secondary | ICD-10-CM | POA: Insufficient documentation

## 2021-06-02 DIAGNOSIS — Z720 Tobacco use: Secondary | ICD-10-CM

## 2021-06-02 DIAGNOSIS — R42 Dizziness and giddiness: Secondary | ICD-10-CM | POA: Diagnosis not present

## 2021-06-02 DIAGNOSIS — Z6841 Body Mass Index (BMI) 40.0 and over, adult: Secondary | ICD-10-CM | POA: Insufficient documentation

## 2021-06-02 DIAGNOSIS — Z7984 Long term (current) use of oral hypoglycemic drugs: Secondary | ICD-10-CM | POA: Diagnosis not present

## 2021-06-02 DIAGNOSIS — Z7901 Long term (current) use of anticoagulants: Secondary | ICD-10-CM | POA: Diagnosis not present

## 2021-06-02 DIAGNOSIS — R002 Palpitations: Secondary | ICD-10-CM | POA: Diagnosis not present

## 2021-06-02 DIAGNOSIS — M5136 Other intervertebral disc degeneration, lumbar region: Secondary | ICD-10-CM | POA: Diagnosis not present

## 2021-06-02 LAB — BASIC METABOLIC PANEL
Anion gap: 8 (ref 5–15)
BUN: 16 mg/dL (ref 8–23)
CO2: 28 mmol/L (ref 22–32)
Calcium: 9 mg/dL (ref 8.9–10.3)
Chloride: 102 mmol/L (ref 98–111)
Creatinine, Ser: 1.27 mg/dL — ABNORMAL HIGH (ref 0.61–1.24)
GFR, Estimated: >60 mL/min (ref >60)
Glucose, Bld: 101 mg/dL — ABNORMAL HIGH (ref 70–99)
Potassium: 4 mmol/L (ref 3.5–5.1)
Sodium: 138 mmol/L (ref 135–145)

## 2021-06-02 LAB — BRAIN NATRIURETIC PEPTIDE: B Natriuretic Peptide: 24.1 pg/mL (ref 0.0–100.0)

## 2021-06-02 NOTE — Patient Instructions (Signed)
There has been no changes to your medications. ? ?Labs done today, your results will be available in MyChart, we will contact you for abnormal readings. ? ?Your physician recommends that you schedule a follow-up appointment with Dr. Haroldine Laws as scheduled. ? ?If you have any questions or concerns before your next appointment please send [place] a message through [place] or call our office at [phone].   ? ?TO LEAVE A MESSAGE FOR THE NURSE SELECT OPTION 2, PLEASE LEAVE A MESSAGE INCLUDING: ?YOUR NAME ?DATE OF BIRTH ?CALL BACK NUMBER ?REASON FOR CALL**this is important as we prioritize the call backs ? ?YOU WILL RECEIVE A CALL BACK THE SAME DAY AS LONG AS YOU CALL BEFORE 4:00 PM ? ?At the [facility], you and your health needs are our priority. As part of our continuing mission to provide you with exceptional heart care, we have created designated Provider Care Teams. These Care Teams include your primary Cardiologist (physician) and Advanced Practice Providers (APPs- Physician Assistants and Nurse Practitioners) who all work together to provide you with the care you need, when you need it.  ? ?You may see any of the following providers on your designated Care Team at your next follow up: ?Dr Glori Bickers ?Dr Loralie Champagne ?Darrick Grinder, NP ?Lyda Jester, PA ?Jessica Milford,NP ?Marlyce Huge, PA ?Audry Riles, PharmD ? ? ?Please be sure to bring in all your medications bottles to every appointment.  ? ? ?

## 2021-06-03 ENCOUNTER — Encounter (HOSPITAL_BASED_OUTPATIENT_CLINIC_OR_DEPARTMENT_OTHER): Payer: Self-pay | Admitting: Physical Therapy

## 2021-06-03 ENCOUNTER — Ambulatory Visit (HOSPITAL_BASED_OUTPATIENT_CLINIC_OR_DEPARTMENT_OTHER): Payer: Medicaid Other | Admitting: Physical Therapy

## 2021-06-03 DIAGNOSIS — M5136 Other intervertebral disc degeneration, lumbar region: Secondary | ICD-10-CM | POA: Diagnosis not present

## 2021-06-03 DIAGNOSIS — M25671 Stiffness of right ankle, not elsewhere classified: Secondary | ICD-10-CM

## 2021-06-03 DIAGNOSIS — M79671 Pain in right foot: Secondary | ICD-10-CM

## 2021-06-03 DIAGNOSIS — R262 Difficulty in walking, not elsewhere classified: Secondary | ICD-10-CM

## 2021-06-03 DIAGNOSIS — M25674 Stiffness of right foot, not elsewhere classified: Secondary | ICD-10-CM

## 2021-06-03 DIAGNOSIS — M6281 Muscle weakness (generalized): Secondary | ICD-10-CM
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
## 2021-06-04 ENCOUNTER — Other Ambulatory Visit: Payer: Medicaid Other

## 2021-06-04 DIAGNOSIS — M5136 Other intervertebral disc degeneration, lumbar region: Secondary | ICD-10-CM | POA: Diagnosis not present

## 2021-06-05 DIAGNOSIS — M5136 Other intervertebral disc degeneration, lumbar region: Secondary | ICD-10-CM | POA: Diagnosis not present

## 2021-06-08 ENCOUNTER — Other Ambulatory Visit (HOSPITAL_COMMUNITY): Payer: Self-pay

## 2021-06-08 ENCOUNTER — Encounter (HOSPITAL_BASED_OUTPATIENT_CLINIC_OR_DEPARTMENT_OTHER): Payer: Self-pay | Admitting: Physical Therapy

## 2021-06-08 ENCOUNTER — Ambulatory Visit (HOSPITAL_BASED_OUTPATIENT_CLINIC_OR_DEPARTMENT_OTHER): Payer: Medicaid Other | Admitting: Physical Therapy

## 2021-06-08 DIAGNOSIS — M6281 Muscle weakness (generalized): Secondary | ICD-10-CM

## 2021-06-08 DIAGNOSIS — M5136 Other intervertebral disc degeneration, lumbar region: Secondary | ICD-10-CM | POA: Diagnosis not present

## 2021-06-08 DIAGNOSIS — M79671 Pain in right foot: Secondary | ICD-10-CM

## 2021-06-08 DIAGNOSIS — G8929 Other chronic pain: Secondary | ICD-10-CM

## 2021-06-08 DIAGNOSIS — M25671 Stiffness of right ankle, not elsewhere classified: Secondary | ICD-10-CM

## 2021-06-08 DIAGNOSIS — M25674 Stiffness of right foot, not elsewhere classified: Secondary | ICD-10-CM

## 2021-06-08 DIAGNOSIS — R262 Difficulty in walking, not elsewhere classified: Secondary | ICD-10-CM
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
## 2021-06-09 ENCOUNTER — Telehealth: Payer: Self-pay | Admitting: Pharmacist

## 2021-06-09 DIAGNOSIS — M5136 Other intervertebral disc degeneration, lumbar region: Secondary | ICD-10-CM | POA: Diagnosis not present

## 2021-06-09 DIAGNOSIS — Z720 Tobacco use: Secondary | ICD-10-CM

## 2021-06-09 NOTE — Telephone Encounter (Signed)
-----   Message from Leavy Cella, [place] sent at 05/27/2021  1:46 PM EDT ----- ?Regarding: Tobacco intake reduction ? ? ?

## 2021-06-09 NOTE — Telephone Encounter (Signed)
Patient contacted for follow/up of tobacco intake reduction / tobacco cessation attempt.  ? ?Since last contact patient reports continued smoking of 10 cigarettes per day.  ?Medications currently being used; None.  ? ?Patient denies any significant side effects from tobacco cessation therapy.  ?Rates IMPORTANCE of quitting tobacco on 1-10 scale of 7. ? ?Most common triggers to use tobacco include; Pain and Stress  ?Motivation to quit: better to deal with stress.  ?Provided information on 1 800-QUIT NOW support program.  ? ?Total time with patient call and documentation of interaction: 14 minutes. ? ?F/U Phone call planned: 3-4 weeks ? ? ?

## 2021-06-09 NOTE — Assessment & Plan Note (Signed)
Patient contacted for follow/up of tobacco intake reduction / tobacco cessation attempt.  ? ?Since last contact patient reports continued smoking of 10 cigarettes per day.  ?Medications currently being used; None.  ? ?Patient denies any significant side effects from tobacco cessation therapy.  ?Rates IMPORTANCE of quitting tobacco on 1-10 scale of 7. ? ?Most common triggers to use tobacco include; Pain and Stress  ?Motivation to quit: better to deal with stress.  ?Provided information on 1 800-QUIT NOW support program.  ?

## 2021-06-10 ENCOUNTER — Ambulatory Visit: Payer: Self-pay

## 2021-06-10 ENCOUNTER — Encounter (HOSPITAL_BASED_OUTPATIENT_CLINIC_OR_DEPARTMENT_OTHER): Payer: Self-pay | Admitting: Physical Therapy

## 2021-06-10 ENCOUNTER — Ambulatory Visit (HOSPITAL_BASED_OUTPATIENT_CLINIC_OR_DEPARTMENT_OTHER): Payer: Medicaid Other | Admitting: Physical Therapy

## 2021-06-10 DIAGNOSIS — M6281 Muscle weakness (generalized): Secondary | ICD-10-CM

## 2021-06-10 DIAGNOSIS — M25671 Stiffness of right ankle, not elsewhere classified: Secondary | ICD-10-CM

## 2021-06-10 DIAGNOSIS — M79671 Pain in right foot: Secondary | ICD-10-CM

## 2021-06-10 DIAGNOSIS — M25674 Stiffness of right foot, not elsewhere classified: Secondary | ICD-10-CM

## 2021-06-10 DIAGNOSIS — R262 Difficulty in walking, not elsewhere classified: Secondary | ICD-10-CM

## 2021-06-10 DIAGNOSIS — M545 Low back pain, unspecified: Secondary | ICD-10-CM

## 2021-06-10 DIAGNOSIS — M5136 Other intervertebral disc degeneration, lumbar region: Secondary | ICD-10-CM | POA: Diagnosis not present
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
## 2021-06-11 ENCOUNTER — Ambulatory Visit: Payer: Medicaid Other

## 2021-06-11 DIAGNOSIS — M21371 Foot drop, right foot: Secondary | ICD-10-CM

## 2021-06-11 DIAGNOSIS — M5136 Other intervertebral disc degeneration, lumbar region: Secondary | ICD-10-CM | POA: Diagnosis not present

## 2021-06-11 NOTE — Progress Notes (Signed)
SITUATION ?Reason for Consult: Attempted fitting of [place] Reaction ?Patient / Caregiver Report: Patient reports pain on shin-bone ? ?OBJECTIVE DATA ?History / Diagnosis:  ?  ICD-10-CM   ?1. Right foot drop  M21.371   ?  ? ? ?Change in Pathology: None ? ?ACTIONS PERFORMED ?Patient's equipment was checked for structural stability and fit. Pretibial shell requires significant padding in order to prevent irritation of distal tibia. Device is to be modified and patient to be rescheduled at his earliest convenience ? ?PLAN ?Patient to return for fitting at his earliest convenience. Plan of care discussed with and agreed upon by patient / caregiver. ? ?

## 2021-06-12 DIAGNOSIS — M5136 Other intervertebral disc degeneration, lumbar region: Secondary | ICD-10-CM | POA: Diagnosis not present
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
## 2021-06-15 ENCOUNTER — Ambulatory Visit (HOSPITAL_BASED_OUTPATIENT_CLINIC_OR_DEPARTMENT_OTHER): Payer: Medicaid Other | Admitting: Physical Therapy

## 2021-06-15 ENCOUNTER — Encounter (HOSPITAL_BASED_OUTPATIENT_CLINIC_OR_DEPARTMENT_OTHER): Payer: Self-pay | Admitting: Physical Therapy

## 2021-06-15 DIAGNOSIS — M79671 Pain in right foot: Secondary | ICD-10-CM

## 2021-06-15 DIAGNOSIS — M6281 Muscle weakness (generalized): Secondary | ICD-10-CM

## 2021-06-15 DIAGNOSIS — M5459 Other low back pain: Secondary | ICD-10-CM

## 2021-06-15 DIAGNOSIS — M25674 Stiffness of right foot, not elsewhere classified: Secondary | ICD-10-CM

## 2021-06-15 DIAGNOSIS — M25671 Stiffness of right ankle, not elsewhere classified: Secondary | ICD-10-CM

## 2021-06-15 DIAGNOSIS — M5136 Other intervertebral disc degeneration, lumbar region: Secondary | ICD-10-CM | POA: Diagnosis not present

## 2021-06-15 DIAGNOSIS — R262 Difficulty in walking, not elsewhere classified: Secondary | ICD-10-CM

## 2021-06-16 ENCOUNTER — Other Ambulatory Visit: Payer: Self-pay | Admitting: Family Medicine

## 2021-06-16 DIAGNOSIS — M5136 Other intervertebral disc degeneration, lumbar region: Secondary | ICD-10-CM | POA: Diagnosis not present

## 2021-06-17 ENCOUNTER — Encounter (HOSPITAL_BASED_OUTPATIENT_CLINIC_OR_DEPARTMENT_OTHER): Payer: Self-pay | Admitting: Physical Therapy

## 2021-06-17 ENCOUNTER — Ambulatory Visit (HOSPITAL_BASED_OUTPATIENT_CLINIC_OR_DEPARTMENT_OTHER): Payer: Medicaid Other | Admitting: Physical Therapy

## 2021-06-17 DIAGNOSIS — M6281 Muscle weakness (generalized): Secondary | ICD-10-CM

## 2021-06-17 DIAGNOSIS — M79671 Pain in right foot: Secondary | ICD-10-CM | POA: Diagnosis not present

## 2021-06-17 DIAGNOSIS — M25671 Stiffness of right ankle, not elsewhere classified: Secondary | ICD-10-CM

## 2021-06-17 DIAGNOSIS — M5136 Other intervertebral disc degeneration, lumbar region: Secondary | ICD-10-CM | POA: Diagnosis not present

## 2021-06-17 DIAGNOSIS — M5459 Other low back pain: Secondary | ICD-10-CM
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
## 2021-06-18 ENCOUNTER — Ambulatory Visit: Payer: Medicaid Other

## 2021-06-18 DIAGNOSIS — M5136 Other intervertebral disc degeneration, lumbar region: Secondary | ICD-10-CM | POA: Diagnosis not present

## 2021-06-18 DIAGNOSIS — M21371 Foot drop, right foot: Secondary | ICD-10-CM

## 2021-06-18 NOTE — Progress Notes (Signed)
SITUATION ?Reason for Consult: Follow-up with right [place] WalkOn Reaction ?Patient / Caregiver Report: Patient cannot tolerate any shin pressure, another brace is required ? ?OBJECTIVE DATA ?History / Diagnosis:  ?  ICD-10-CM   ?1. Right foot drop  M21.371   ?  ? ? ?Change in Pathology: None ? ?ACTIONS PERFORMED ?Patient's equipment was checked for structural stability and fit. Attempted to fit device with padding modification. Modification was unsuccessful. Will order WalkOn Trimmable XL R. Device(s) intact and fit is excellent. All questions answered and concerns addressed. ? ?PLAN ?Patient to be contacted when new brace ready. Plan of care discussed with and agreed upon by patient / caregiver. ? ?

## 2021-06-19 DIAGNOSIS — M5136 Other intervertebral disc degeneration, lumbar region: Secondary | ICD-10-CM | POA: Diagnosis not present

## 2021-06-22 DIAGNOSIS — M5136 Other intervertebral disc degeneration, lumbar region: Secondary | ICD-10-CM | POA: Diagnosis not present

## 2021-06-23 DIAGNOSIS — M5136 Other intervertebral disc degeneration, lumbar region: Secondary | ICD-10-CM | POA: Diagnosis not present

## 2021-06-24 ENCOUNTER — Encounter (HOSPITAL_BASED_OUTPATIENT_CLINIC_OR_DEPARTMENT_OTHER): Payer: Self-pay | Admitting: Physical Therapy

## 2021-06-24 ENCOUNTER — Ambulatory Visit (HOSPITAL_BASED_OUTPATIENT_CLINIC_OR_DEPARTMENT_OTHER): Payer: Medicaid Other | Attending: Family Medicine | Admitting: Physical Therapy

## 2021-06-24 DIAGNOSIS — M79671 Pain in right foot: Secondary | ICD-10-CM | POA: Insufficient documentation

## 2021-06-24 DIAGNOSIS — M6281 Muscle weakness (generalized): Secondary | ICD-10-CM | POA: Diagnosis present

## 2021-06-24 DIAGNOSIS — M5136 Other intervertebral disc degeneration, lumbar region: Secondary | ICD-10-CM | POA: Diagnosis not present

## 2021-06-24 DIAGNOSIS — M25674 Stiffness of right foot, not elsewhere classified: Secondary | ICD-10-CM | POA: Diagnosis present

## 2021-06-24 DIAGNOSIS — M5459 Other low back pain: Secondary | ICD-10-CM | POA: Insufficient documentation

## 2021-06-24 DIAGNOSIS — M25671 Stiffness of right ankle, not elsewhere classified: Secondary | ICD-10-CM | POA: Diagnosis present
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
## 2021-06-25 DIAGNOSIS — M5136 Other intervertebral disc degeneration, lumbar region: Secondary | ICD-10-CM | POA: Diagnosis not present

## 2021-06-26 DIAGNOSIS — M5136 Other intervertebral disc degeneration, lumbar region: Secondary | ICD-10-CM | POA: Diagnosis not present

## 2021-06-28 ENCOUNTER — Emergency Department (HOSPITAL_COMMUNITY): Payer: Medicaid Other

## 2021-06-28 ENCOUNTER — Encounter (HOSPITAL_COMMUNITY): Payer: Self-pay | Admitting: *Deleted

## 2021-06-28 ENCOUNTER — Inpatient Hospital Stay (HOSPITAL_COMMUNITY)
Admission: EM | Admit: 2021-06-28 | Discharge: 2021-06-30 | DRG: 872 | Discharge status: Against Medical Advice | Payer: Medicaid Other | Attending: Family Medicine | Admitting: Family Medicine

## 2021-06-28 ENCOUNTER — Other Ambulatory Visit: Payer: Self-pay

## 2021-06-28 DIAGNOSIS — A4159 Other Gram-negative sepsis: Principal | ICD-10-CM | POA: Diagnosis present

## 2021-06-28 DIAGNOSIS — J189 Pneumonia, unspecified organism: Secondary | ICD-10-CM

## 2021-06-28 DIAGNOSIS — A419 Sepsis, unspecified organism: Secondary | ICD-10-CM | POA: Diagnosis not present

## 2021-06-28 DIAGNOSIS — E1151 Type 2 diabetes mellitus with diabetic peripheral angiopathy without gangrene: Secondary | ICD-10-CM | POA: Diagnosis present

## 2021-06-28 DIAGNOSIS — Z7984 Long term (current) use of oral hypoglycemic drugs: Secondary | ICD-10-CM

## 2021-06-28 DIAGNOSIS — Z7901 Long term (current) use of anticoagulants: Secondary | ICD-10-CM

## 2021-06-28 DIAGNOSIS — R509 Fever, unspecified: Secondary | ICD-10-CM | POA: Diagnosis not present

## 2021-06-28 DIAGNOSIS — E854 Organ-limited amyloidosis: Secondary | ICD-10-CM | POA: Diagnosis present

## 2021-06-28 DIAGNOSIS — I110 Hypertensive heart disease with heart failure: Secondary | ICD-10-CM | POA: Diagnosis present

## 2021-06-28 DIAGNOSIS — G4733 Obstructive sleep apnea (adult) (pediatric): Secondary | ICD-10-CM | POA: Diagnosis present

## 2021-06-28 DIAGNOSIS — Z6841 Body Mass Index (BMI) 40.0 and over, adult: Secondary | ICD-10-CM

## 2021-06-28 DIAGNOSIS — I25110 Atherosclerotic heart disease of native coronary artery with unstable angina pectoris: Secondary | ICD-10-CM | POA: Diagnosis present

## 2021-06-28 DIAGNOSIS — J9811 Atelectasis: Secondary | ICD-10-CM | POA: Diagnosis not present

## 2021-06-28 DIAGNOSIS — Z823 Family history of stroke: Secondary | ICD-10-CM

## 2021-06-28 DIAGNOSIS — B9689 Other specified bacterial agents as the cause of diseases classified elsewhere: Secondary | ICD-10-CM | POA: Diagnosis present

## 2021-06-28 DIAGNOSIS — J449 Chronic obstructive pulmonary disease, unspecified: Secondary | ICD-10-CM | POA: Diagnosis present

## 2021-06-28 DIAGNOSIS — J9690 Respiratory failure, unspecified, unspecified whether with hypoxia or hypercapnia: Secondary | ICD-10-CM | POA: Diagnosis not present

## 2021-06-28 DIAGNOSIS — Z981 Arthrodesis status: Secondary | ICD-10-CM

## 2021-06-28 DIAGNOSIS — Z5329 Procedure and treatment not carried out because of patient's decision for other reasons: Secondary | ICD-10-CM | POA: Diagnosis present

## 2021-06-28 DIAGNOSIS — I10 Essential (primary) hypertension: Secondary | ICD-10-CM | POA: Diagnosis not present

## 2021-06-28 DIAGNOSIS — G4489 Other headache syndrome: Secondary | ICD-10-CM | POA: Diagnosis not present

## 2021-06-28 DIAGNOSIS — Z8249 Family history of ischemic heart disease and other diseases of the circulatory system: Secondary | ICD-10-CM

## 2021-06-28 DIAGNOSIS — Z955 Presence of coronary angioplasty implant and graft: Secondary | ICD-10-CM

## 2021-06-28 DIAGNOSIS — E7800 Pure hypercholesterolemia, unspecified: Secondary | ICD-10-CM | POA: Diagnosis present

## 2021-06-28 DIAGNOSIS — J9600 Acute respiratory failure, unspecified whether with hypoxia or hypercapnia: Secondary | ICD-10-CM | POA: Diagnosis not present

## 2021-06-28 DIAGNOSIS — I2510 Atherosclerotic heart disease of native coronary artery without angina pectoris: Secondary | ICD-10-CM | POA: Diagnosis present

## 2021-06-28 DIAGNOSIS — Z833 Family history of diabetes mellitus: Secondary | ICD-10-CM

## 2021-06-28 DIAGNOSIS — K219 Gastro-esophageal reflux disease without esophagitis: Secondary | ICD-10-CM | POA: Diagnosis present

## 2021-06-28 DIAGNOSIS — Z20822 Contact with and (suspected) exposure to covid-19: Secondary | ICD-10-CM | POA: Diagnosis present

## 2021-06-28 DIAGNOSIS — F17290 Nicotine dependence, other tobacco product, uncomplicated: Secondary | ICD-10-CM | POA: Diagnosis present

## 2021-06-28 DIAGNOSIS — L03115 Cellulitis of right lower limb: Secondary | ICD-10-CM | POA: Diagnosis present

## 2021-06-28 DIAGNOSIS — Z86711 Personal history of pulmonary embolism: Secondary | ICD-10-CM

## 2021-06-28 DIAGNOSIS — R0789 Other chest pain: Secondary | ICD-10-CM | POA: Diagnosis not present

## 2021-06-28 DIAGNOSIS — G609 Hereditary and idiopathic neuropathy, unspecified: Secondary | ICD-10-CM | POA: Diagnosis present

## 2021-06-28 DIAGNOSIS — R110 Nausea: Secondary | ICD-10-CM | POA: Diagnosis not present

## 2021-06-28 DIAGNOSIS — Z79899 Other long term (current) drug therapy: Secondary | ICD-10-CM

## 2021-06-28 DIAGNOSIS — F17210 Nicotine dependence, cigarettes, uncomplicated: Secondary | ICD-10-CM | POA: Diagnosis present

## 2021-06-28 DIAGNOSIS — J811 Chronic pulmonary edema: Secondary | ICD-10-CM | POA: Diagnosis not present

## 2021-06-28 DIAGNOSIS — R079 Chest pain, unspecified: Secondary | ICD-10-CM | POA: Diagnosis not present

## 2021-06-28 DIAGNOSIS — N390 Urinary tract infection, site not specified: Secondary | ICD-10-CM

## 2021-06-28 DIAGNOSIS — I5032 Chronic diastolic (congestive) heart failure: Secondary | ICD-10-CM | POA: Diagnosis present

## 2021-06-28 LAB — URINALYSIS, ROUTINE W REFLEX MICROSCOPIC
Bilirubin Urine: NEGATIVE
Glucose, UA: >=500 mg/dL — AB
Hgb urine dipstick: NEGATIVE
Ketones, ur: NEGATIVE mg/dL
Nitrite: NEGATIVE
Protein, ur: NEGATIVE mg/dL
Specific Gravity, Urine: 1.013 (ref 1.005–1.030)
pH: 5 (ref 5.0–8.0)

## 2021-06-28 LAB — CBC WITH DIFFERENTIAL/PLATELET
Abs Immature Granulocytes: 0.11 10*3/uL — ABNORMAL HIGH (ref 0.00–0.07)
Basophils Absolute: 0 10*3/uL (ref 0.0–0.1)
Basophils Relative: 0 %
Eosinophils Absolute: 0.1 10*3/uL (ref 0.0–0.5)
Eosinophils Relative: 0 %
HCT: 46.5 % (ref 39.0–52.0)
Hemoglobin: 15 g/dL (ref 13.0–17.0)
Immature Granulocytes: 1 %
Lymphocytes Relative: 5 %
Lymphs Abs: 0.9 10*3/uL (ref 0.7–4.0)
MCH: 29.6 pg (ref 26.0–34.0)
MCHC: 32.3 g/dL (ref 30.0–36.0)
MCV: 91.9 fL (ref 80.0–100.0)
Monocytes Absolute: 0.9 10*3/uL (ref 0.1–1.0)
Monocytes Relative: 5 %
Neutro Abs: 17 10*3/uL — ABNORMAL HIGH (ref 1.7–7.7)
Neutrophils Relative %: 89 %
Platelets: 239 10*3/uL (ref 150–400)
RBC: 5.06 MIL/uL (ref 4.22–5.81)
RDW: 16.1 % — ABNORMAL HIGH (ref 11.5–15.5)
WBC: 18.9 10*3/uL — ABNORMAL HIGH (ref 4.0–10.5)
nRBC: 0 % (ref 0.0–0.2)

## 2021-06-28 LAB — COMPREHENSIVE METABOLIC PANEL
ALT: 33 U/L (ref 0–44)
AST: 28 U/L (ref 15–41)
Albumin: 4.2 g/dL (ref 3.5–5.0)
Alkaline Phosphatase: 98 U/L (ref 38–126)
Anion gap: 9 (ref 5–15)
BUN: 18 mg/dL (ref 8–23)
CO2: 25 mmol/L (ref 22–32)
Calcium: 9.4 mg/dL (ref 8.9–10.3)
Chloride: 105 mmol/L (ref 98–111)
Creatinine, Ser: 1.29 mg/dL — ABNORMAL HIGH (ref 0.61–1.24)
GFR, Estimated: >60 mL/min (ref >60)
Glucose, Bld: 95 mg/dL (ref 70–99)
Potassium: 4.6 mmol/L (ref 3.5–5.1)
Sodium: 139 mmol/L (ref 135–145)
Total Bilirubin: 1 mg/dL (ref 0.3–1.2)
Total Protein: 7.7 g/dL (ref 6.5–8.1)

## 2021-06-28 LAB — BRAIN NATRIURETIC PEPTIDE: B Natriuretic Peptide: 76.2 pg/mL (ref 0.0–100.0)

## 2021-06-28 LAB — TROPONIN I (HIGH SENSITIVITY)
Troponin I (High Sensitivity): 14 ng/L (ref <18)
Troponin I (High Sensitivity): 23 ng/L — ABNORMAL HIGH (ref <18)

## 2021-06-28 LAB — LACTIC ACID, PLASMA: Lactic Acid, Venous: 1.6 mmol/L (ref 0.5–1.9)

## 2021-06-28 LAB — GLUCOSE, CAPILLARY: Glucose-Capillary: 89 mg/dL (ref 70–99)

## 2021-06-28 LAB — RESP PANEL BY RT-PCR (FLU A&B, COVID) ARPGX2
Influenza A by PCR: NEGATIVE
Influenza B by PCR: NEGATIVE
SARS Coronavirus 2 by RT PCR: NEGATIVE

## 2021-06-28 LAB — HIV ANTIBODY (ROUTINE TESTING W REFLEX): HIV Screen 4th Generation wRfx: NONREACTIVE

## 2021-06-28 MED ORDER — ONDANSETRON HCL 4 MG/2ML IJ SOLN
4.0000 mg | Freq: Once | INTRAMUSCULAR | Status: AC
Start: 2021-06-28 — End: 2021-06-28
  Administered 2021-06-28: 4 mg via INTRAVENOUS
  Filled 2021-06-28: qty 2

## 2021-06-28 MED ORDER — ACETAMINOPHEN 325 MG PO TABS
650.0000 mg | ORAL_TABLET | Freq: Four times a day (QID) | ORAL | Status: DC | PRN
  Administered 2021-06-29: 650 mg via ORAL
  Filled 2021-06-28 (×2): qty 2

## 2021-06-28 MED ORDER — ALBUTEROL SULFATE HFA 108 (90 BASE) MCG/ACT IN AERS: 2.0000 | INHALATION_SPRAY | Freq: Four times a day (QID) | RESPIRATORY_TRACT | Status: DC | PRN

## 2021-06-28 MED ORDER — UMECLIDINIUM-VILANTEROL 62.5-25 MCG/ACT IN AEPB
1.0000 | INHALATION_SPRAY | Freq: Every day | RESPIRATORY_TRACT | Status: DC
  Administered 2021-06-29 – 2021-06-30 (×2): 1 via RESPIRATORY_TRACT
  Filled 2021-06-28: qty 14

## 2021-06-28 MED ORDER — DAPAGLIFLOZIN PROPANEDIOL 10 MG PO TABS
10.0000 mg | ORAL_TABLET | Freq: Every day | ORAL | Status: DC
  Administered 2021-06-29 – 2021-06-30 (×2): 10 mg via ORAL
  Filled 2021-06-28 (×3): qty 1

## 2021-06-28 MED ORDER — ATORVASTATIN CALCIUM 40 MG PO TABS
80.0000 mg | ORAL_TABLET | Freq: Every day | ORAL | Status: DC
  Administered 2021-06-29 – 2021-06-30 (×2): 80 mg via ORAL
  Filled 2021-06-28 (×2): qty 2

## 2021-06-28 MED ORDER — PREGABALIN 100 MG PO CAPS
100.0000 mg | ORAL_CAPSULE | Freq: Three times a day (TID) | ORAL | Status: DC
  Administered 2021-06-28 – 2021-06-30 (×6): 100 mg via ORAL
  Filled 2021-06-28 (×6): qty 1

## 2021-06-28 MED ORDER — CEFTRIAXONE SODIUM 2 G IJ SOLR
2.0000 g | Freq: Once | INTRAMUSCULAR | Status: AC
Start: 2021-06-28 — End: 2021-06-28
  Administered 2021-06-28: 2 g via INTRAVENOUS
  Filled 2021-06-28: qty 20

## 2021-06-28 MED ORDER — VALACYCLOVIR HCL 500 MG PO TABS
500.0000 mg | ORAL_TABLET | Freq: Every day | ORAL | Status: DC
Start: 2021-06-29 — End: 2021-06-30
  Administered 2021-06-29 – 2021-06-30 (×2): 500 mg via ORAL
  Filled 2021-06-28 (×2): qty 1

## 2021-06-28 MED ORDER — SODIUM CHLORIDE 0.9 % IV BOLUS
500.0000 mL | Freq: Once | INTRAVENOUS | Status: AC
  Administered 2021-06-28: 500 mL via INTRAVENOUS

## 2021-06-28 MED ORDER — PANTOPRAZOLE SODIUM 40 MG PO TBEC
40.0000 mg | DELAYED_RELEASE_TABLET | Freq: Every day | ORAL | Status: DC
  Administered 2021-06-29 – 2021-06-30 (×2): 40 mg via ORAL
  Filled 2021-06-28 (×2): qty 1

## 2021-06-28 MED ORDER — ALBUTEROL SULFATE (2.5 MG/3ML) 0.083% IN NEBU
2.5000 mg | INHALATION_SOLUTION | Freq: Four times a day (QID) | RESPIRATORY_TRACT | Status: DC | PRN
  Administered 2021-06-28: 2.5 mg via RESPIRATORY_TRACT
  Filled 2021-06-28: qty 3

## 2021-06-28 MED ORDER — ACETAMINOPHEN 325 MG PO TABS
650.0000 mg | ORAL_TABLET | Freq: Four times a day (QID) | ORAL | Status: DC | PRN
  Administered 2021-06-28: 650 mg via ORAL
  Filled 2021-06-28: qty 2

## 2021-06-28 MED ORDER — TAFAMIDIS 61 MG PO CAPS: 1.0000 | ORAL_CAPSULE | Freq: Every day | ORAL | Status: DC

## 2021-06-28 MED ORDER — APIXABAN 2.5 MG PO TABS
2.5000 mg | ORAL_TABLET | Freq: Two times a day (BID) | ORAL | Status: DC
  Administered 2021-06-28 – 2021-06-29 (×2): 2.5 mg via ORAL
  Filled 2021-06-28 (×2): qty 1

## 2021-06-28 NOTE — ED Provider Notes (Signed)
MSE was initiated and I personally evaluated the patient and placed orders (if any) at  2:34 PM on Jun 28, 2021. ? ?The patient appears stable so that the remainder of the MSE may be completed by another provider. ? ?Patient arrives to the emergency department with fever, rigors, shortness of breath, chest tightness, cough.  Symptoms began suddenly.  He was in his normal state of health and visiting a friend in the [place] [facility].  He returned home and developed acute onset rigors.  Was found to have fever.  No meds administered prior to arrival.  ? ?Vitals:  ? 06/28/21 1358 06/28/21 1400  ?BP: (!) 154/68 (!) 155/76  ?Pulse: 97 93  ?Resp: (!) 23 (!) 21  ?Temp:    ?SpO2: 100% 99%  ? ?Exam:  ? ?General: Patient is awake and alert with active rigors. No AMS. ?Cardiac: Mild tachycardia but patient overall well perfused.  ?Resp: No wheezing, rales, rhonchi.  ?Abdomen: Mild distention but diffusely non-tender. ? ? ?Plan:  ? ?Patient arrives febrile with mild tachycardia.  Will need to screen for sepsis.  Clinically, most of the patient's symptoms are respiratory.  Have added COVID/flu testing along with cultures, labs, lactic acid.  Will give Tylenol and gentle IV fluid bolus. ?  ?Margette Fast, MD ?06/28/21 1436 ? ?

## 2021-06-28 NOTE — Progress Notes (Signed)
Family medicine teaching service will be admitting this patient. Our pager information can be located in the physician sticky notes, treatment team sticky notes, and the headers of all our official daily progress notes.   FAMILY MEDICINE TEACHING SERVICE Patient - Please contact intern pager [phone] or text page via website [email or web address] ([email or web address]) for questions regarding care. DO NOT page listed attending provider unless there is no answer from the number above.   Robbye Dede, MD PGY-2, [facility] pager [phone]   

## 2021-06-28 NOTE — Progress Notes (Signed)
Patient transferred from ED to 5W11. Patient is alert and oriented to person, place, time, and situation. Telemetry monitoring enabled, vital signs taken, and IVs assessed for patency. Skin checked with Roxanne Gates. Fall precautions initiated. Patient bed in the locked, lowest position. Non-slip socks in place and bed alarm on. Call bell is within reach. Patient knows to call for assistance prior to getting up and patient demonstrates use. Patient is laying comfortably in bed. ? ?

## 2021-06-28 NOTE — ED Triage Notes (Signed)
Pt here via [facility] from home for headache, chest pain and chills.  Acute onset at 80 today.  Was found sitting on porch, shaking and wearing a hoodie.  ? ?Bp 173/82 ?Hr 87 ?O2 97% ra ?Cbg 113 ?

## 2021-06-28 NOTE — ED Provider Notes (Signed)
?[facility] ?Provider Note ? ? ?CSN: [id number] ?Arrival date & time: 06/28/21  1329 ? ?  ? ?History ? ?Chief Complaint  ?Patient presents with  ? Chest Pain  ? Headache  ? ? ?Russell Collier is a 61 y.o. male. ? ? ?Chest Pain ?Associated symptoms: headache   ?Headache ? ?Patient is 61 year old male with a history of cardiac amyloidosis, OSA, CHF, COPD, coronary artery disease, diabetes who presents to the emergency department for acute onset left-sided chest pain.  Reported symptoms started around noon and he does have associated cough.  He has also been feeling chills and rigors.  He does have associated shortness of breath that is exertional.  He is not sure if there is orthopnea.  He has not noticed significant weight gain or lower extremity edema.  He reports he recently visited a friend at a hospital otherwise denies any other sick contact.  Currently reports his chest pain has improved.  Denies emesis or diarrhea.  He does have associated headache.  Denies any blurry vision.  Denies any urinary incontinence.  Does report being compliant with his medication.  He does smoke but does not use excessive EtOH.  Denies any illicit substance use.  Otherwise no other complaints. ? ?Home Medications ?Prior to Admission medications   ?Medication Sig Start Date End Date Taking? Authorizing Provider  ?Accu-Chek Softclix Lancets lancets Use as instructed 11/20/20   [name], MD  ?albuterol (PROVENTIL) (2.5 MG/3ML) 0.083% nebulizer solution Take 3 mLs (2.5 mg total) by nebulization every 6 (six) hours as needed for wheezing or shortness of breath. 11/20/20   [name], MD  ?albuterol (VENTOLIN HFA) 108 (90 Base) MCG/ACT inhaler Inhale 2 puffs into the lungs every 6 (six) hours as needed for wheezing or shortness of breath. 11/20/20   [name], MD  ?Celedonio Miyamoto 62.5-25 MCG/ACT AEPB INHALE 1 PUFF INTO THE LUNGS DAILY AS NEEDED. 05/19/21   [name], MD  ?apixaban  (ELIQUIS) 2.5 MG TABS tablet Take 1 tablet (2.5 mg total) by mouth 2 (two) times daily. 11/20/20   [name], MD  ?atorvastatin (LIPITOR) 80 MG tablet Take 1 tablet (80 mg total) by mouth daily. 11/20/20   [name], MD  ?Blood Glucose Monitoring Suppl (ACCU-CHEK GUIDE) w/Device KIT 1 Units by Does not apply route daily. 11/20/20   [name], MD  ?enalapril (VASOTEC) 20 MG tablet Take 1 tablet (20 mg total) by mouth at bedtime. 11/20/20   [name], MD  ?FARXIGA 10 MG TABS tablet TAKE 1 TABLET (10 MG TOTAL) BY MOUTH DAILY BEFORE BREAKFAST. 06/16/21   [name], MD  ?fluticasone (FLONASE) 50 MCG/ACT nasal spray Place 1 spray into both nostrils daily. ?Patient taking differently: Place 1 spray into both nostrils daily as needed for allergies. 11/20/20   [name], MD  ?furosemide (LASIX) 40 MG tablet Take 1.5 tablets (60 mg total) by mouth 2 (two) times daily. 05/21/21   Lyda Jester M, PA-C  ?glucose blood (ACCU-CHEK GUIDE) test strip Use as instructed 11/20/20   [name], MD  ?ibuprofen (ADVIL) 200 MG tablet Take 800 mg by mouth every 6 (six) hours as needed for moderate pain.    [provider]  ?isosorbide mononitrate (IMDUR) 30 MG 24 hr tablet Take 1 tablet (30 mg total) by mouth daily. 11/20/20   [name], MD  ?Multiple Vitamin (MULTIVITAMIN WITH MINERALS) TABS tablet Take 1 tablet by mouth daily.    [provider]  ?pantoprazole (PROTONIX) 40  MG tablet TAKE 1 TABLET (40 MG TOTAL) BY MOUTH DAILY. 05/19/21   [name], MD  ?potassium chloride SA (KLOR-CON M) 20 MEQ tablet Take 2 tablets (40 mEq total) by mouth daily. 05/21/21   Lyda Jester M, PA-C  ?pregabalin (LYRICA) 100 MG capsule TAKE ONE CAPSULE BY MOUTH THREE TIMES DAILY 04/23/21   [name], MD  ?Tafamidis (VYNDAMAX) 61 MG CAPS TAKE 1 CAPSULE BY MOUTH DAILY. 12/01/20 12/01/21  [name], [name], MD  ?triamcinolone ointment (KENALOG) 0.1 % Apply 1 application  topically 2 (two) times daily. 03/12/21   Alcus Dad, MD  ?valACYclovir (VALTREX) 500 MG tablet Take 1 tablet (500 mg total) by mouth daily. 11/20/20   [name], MD  ?Vitamin A 2400 MCG (8000 UT) TABS Take 1 tablet by mouth daily. 05/21/21   [name], [name], MD  ?vutrisiran sodium (AMVUTTRA) 25 MG/0.5ML syringe Inject 0.5 mLs (25 mg total) into the skin every 3 (three) months. 05/01/21   [name], [name], MD  ?   ? ?Allergies    ?Varenicline, Bupropion, and Other   ? ?Review of Systems   ?Review of Systems  ?Cardiovascular:  Positive for chest pain.  ?Neurological:  Positive for headaches.  ? ?Physical Exam ?Updated Vital Signs ?BP 121/83   Pulse (!) 104   Temp (!) 103 ?F (39.4 ?C) (Oral)   Resp 18   Ht 5' 10.5" (1.791 m)   Wt (!) 139.7 kg   SpO2 96%   BMI 43.57 kg/m?  ?Physical Exam ?Constitutional:   ?   Appearance: He is obese. He is ill-appearing.  ?HENT:  ?   Head: Normocephalic.  ?Cardiovascular:  ?   Rate and Rhythm: Normal rate.  ?Pulmonary:  ?   Breath sounds: Decreased breath sounds present.  ?   Comments: Decreased breath sound bilaterally most likely due to body habitus.  I did not appreciate wheezing or crackles.  However this is limited exam. ?Chest:  ?   Chest wall: No mass or crepitus.  ?Abdominal:  ?   Tenderness: There is no guarding or rebound.  ?   Comments: Mild abdominal distention.  Appears to be chronic.  ?Musculoskeletal:  ?   Cervical back: Normal range of motion.  ?   Right lower leg: No tenderness.  ?   Left lower leg: No tenderness.  ?   Comments: Very mild lower extremity edema in the lower extremity near his ankle.  ?Skin: ?   General: Skin is warm.  ?   Capillary Refill: Capillary refill takes less than 2 seconds.  ?Neurological:  ?   General: No focal deficit present.  ?   Mental Status: He is oriented to person, place, and time.  ? ? ?ED Results / Procedures / Treatments   ?Labs ?(all labs ordered are listed, but only abnormal results are displayed) ?Labs  Reviewed  ?COMPREHENSIVE METABOLIC PANEL - Abnormal; Notable for the following components:  ?    Result Value  ? Creatinine, Ser 1.29 (*)   ? All other components within normal limits  ?CBC WITH DIFFERENTIAL/PLATELET - Abnormal; Notable for the following components:  ? WBC 18.9 (*)   ? RDW 16.1 (*)   ? Neutro Abs 17.0 (*)   ? Abs Immature Granulocytes 0.11 (*)   ? All other components within normal limits  ?URINALYSIS, ROUTINE W REFLEX MICROSCOPIC - Abnormal; Notable for the following components:  ? APPearance HAZY (*)   ? Glucose, UA >=500 (*)   ? Leukocytes,Ua TRACE (*)   ?  Bacteria, UA MANY (*)   ? All other components within normal limits  ?TROPONIN I (HIGH SENSITIVITY) - Abnormal; Notable for the following components:  ? Troponin I (High Sensitivity) 23 (*)   ? All other components within normal limits  ?RESP PANEL BY RT-PCR (FLU A&B, COVID) [id number]  ?CULTURE, BLOOD (ROUTINE X 2)  ?CULTURE, BLOOD (ROUTINE X 2)  ?URINE CULTURE  ?LACTIC ACID, PLASMA  ?BRAIN NATRIURETIC PEPTIDE  ?TROPONIN I (HIGH SENSITIVITY)  ? ? ?EKG ?EKG Interpretation ? ?Date/Time:  Sunday Jun 28 2021 14:03:41 EDT ?Ventricular Rate:  88 ?PR Interval:  204 ?QRS Duration: 135 ?QT Interval:  358 ?QTC Calculation: 434 ?R Axis:   -91 ?Text Interpretation: Sinus rhythm Nonspecific IVCD with LAD Left ventricular hypertrophy Anterior Q waves, possibly due to LVH Confirmed by Nanda Quinton [phone]) on 06/28/2021 2:15:55 PM ? ?Radiology ?DG Chest 2 View ? ?Result Date: 06/28/2021 ?CLINICAL DATA:  Chest pain and fever. EXAM: CHEST - 2 VIEW COMPARISON:  05/26/2021 and prior radiographs FINDINGS: The cardiomediastinal silhouette is unremarkable. Mild pulmonary vascular congestion is noted. Minimal bibasilar atelectasis noted. There is no evidence of pneumothorax, pleural effusion or acute bony abnormality. IMPRESSION: Mild pulmonary vascular congestion and minimal bibasilar atelectasis. Electronically Signed   By: Margarette Canada M.D.   On: 06/28/2021 16:02    ? ?Procedures ?Procedures  ? ?Medications Ordered in ED ?Medications  ?acetaminophen (TYLENOL) tablet 650 mg (650 mg Oral Given 06/28/21 1431)  ?sodium chloride 0.9 % bolus 500 mL (0 mLs Intravenous Stopped 06/28/21 1724)  ?onda

## 2021-06-28 NOTE — ED Notes (Signed)
Pt temp is up to 103.0 ?

## 2021-06-28 NOTE — H&P (Signed)
Family Medicine Teaching Service ?Hospital Admission History and Physical ?Service Pager: [phone] ? ?Patient name: Russell Collier Medical record number: [id number] ?Date of birth: 12/29/1960 Age: 61 y.o. Gender: male ? ?Primary Care Provider: [name], MD ?Consultants: None ?Code Status: Full ?Preferred Emergency Contact: Niece Mancel Parsons [phone] ? ?Chief Complaint: Headache, fever, chills ? ?Assessment and Plan: ?Russell Collier is a 61 y.o. male presenting headache, fever, chills found to meet 4/4 SIRS criteria, ED suspect respiratory source, dx sepsis.  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  ? ?Febrile illness meeting SIRS criteria, currently unknown infection source ?Acute onset febrile illness with fevers and chills, less than 8-hour duration at time of admission.  Patient meets SIRS criteria with Tmax 103 ?F, WBC 18, tachypnea to 27, and low tachycardia to 104.  Unclear source of infection, patient has no focal complaints.  Respiratory less likely given no URI or other respiratory symptoms, no need for supplemental oxygen, negative flu and COVID, and chest x-ray without consolidation.  Could be urinary given UA with leuks and bacteria, although patient denies urinary symptoms; urine culture pending.  Blood culture also pending at this time.  S/p 500 mL NS bolus and ceftriaxone 2 g IV in ED. ?- Admit to med telemetry with Dr. Gwendlyn Deutscher attending ?- Vitals per unit routine ?- Additional 500 mL NS bolus ?- Await blood and urine cultures ? ?RLE redness, swelling, edema  Hx PE ?Acute onset, started in the emergency room.  Concern for right leg DVT.  Will obtain RLE DVT ultrasound to rule out.  He is currently on Eliquis 2.5 mg twice daily; eliquis 5 mg for history of PE, was reduced to 2.5 mg in 2021 due to frequent nosebleeds. ?- Continue Eliquis 2.5 mg twice daily ?- RLE DVT ultrasound ? ?HTN  CAD   HFmrEF ?BP moderately elevated on admission, however further BP measurements were soft, question accuracy given patient positioning and cuff size Home meds include enalapril 20 mg nightly, Lasix 60 mg twice daily, Imdur 30 mg daily, 40 mEq potassium daily, Farxiga 10 mg daily.  Last EF 50-55% in March.  ?- Plan to restart home antihypertensives given consistent elevated BP measurements on floor ?-Continue Farxiga 10 mg daily ? ?Familial cardiac amyloidosis  amyloidosis related neuropathy ?Continue home Tafamidis 61 mg daily and pregabalin 100 mg 3 times daily. ? ?T2DM ?Continue home Farxiga 10 mg daily.  Check A1c in the morning. ? ?COPD  OSA ?CPAP nightly.  Continue home Anoro Ellipta 1 puff daily, albuterol as needed. ? ?HLD ?Continue home atorvastatin 80 mg daily. ? ?Other: ?- Continue valacyclovir 500 mg daily; suspect for HSV suppression however cannot find documentation in chart. ?- Hold vitamin A 2400 mcg (8000 UT) daily, given unknown reason and potential for toxicity ? ? ?FEN/GI: Regular diet, pantoprazole 40 mg ?Prophylaxis: Eliquis 2.5 mg twice daily ? ?Disposition: Med telemetry for observation ? ?History of Present Illness:  Russell Collier is a 61 y.o. male presenting with acute onset febrile illness with fevers and chills, less than 8-hour duration at time of admission.  Patient reports he felt just fine yesterday and this morning.  He was sitting on his porch late morning and started having tactile fevers and chills, developed left-sided headache.  He reports he knew "something was not right" and called 911 to bring him to the hospital. ? ?Denies neck pain, photophobia, phonophobia, muscle aches, nausea, vomiting.  ? ?COVID and flu negative,  chest x-ray simply shows some pulmonary congestion without focal consolidation.  Blood pressures normal to moderately hypertensive.  Tmax 103 ?F.  UA shows glucose >500, trace leuks, many bacteria.  WBC 18.9 with left shift.  Creatinine 1.29, unclear baseline due  to fluctuation.  Urine and blood cultures collected in ED prior to antibiotics.  Troponin essentially flat 14 > 23. ? ?He also reports right lower extremity pain and swelling that just started while in the ED. ? ?Review Of Systems: Per HPI with the following additions:  ? ?Review of Systems  ?Constitutional:  Positive for chills, diaphoresis, fatigue and fever.  ?HENT:  Negative for sneezing and sore throat.   ?Eyes:  Negative for photophobia and pain.  ?Respiratory:  Negative for cough, chest tightness and shortness of breath.   ?Cardiovascular:  Positive for leg swelling. Negative for chest pain and palpitations.  ?Gastrointestinal:  Negative for abdominal pain and nausea.  ?Endocrine: Positive for cold intolerance.  ?Musculoskeletal:  Negative for neck pain and neck stiffness.  ?Neurological:  Positive for headaches.   ? ?Patient Active Problem List  ? Diagnosis Date Noted  ? Sepsis ([place]) 06/28/2021  ? Community acquired pneumonia   ? Gout 02/26/2021  ? Skin tag 02/12/2021  ? Skin cyst 02/12/2021  ? Anticoagulant long-term use 01/29/2021  ? Dermoid cyst of skin of nose 12/24/2020  ? Strain of right calf muscle 07/01/2020  ? Status post lumbar spinal fusion 06/06/2020  ? Impaired mobility and ADLs 04/23/2020  ? Midline low back pain 04/23/2020  ? Neuropathy, amyloid ([place]) 03/20/2020  ? COPD exacerbation ([place]) 03/20/2020  ? Type 2 diabetes mellitus with diabetic peripheral angiopathy without gangrene, without long-term current use of insulin ([place]) 03/20/2020  ? Coagulation defect ([place]) 12/11/2019  ? Fatigue 12/10/2019  ? Leg cramps 12/10/2019  ? Frequent epistaxis 10/30/2019  ? Disc degeneration, 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  ? Ganglion of right knee 09/27/2019  ? Lumbar radiculopathy 09/27/2019  ? Lower extremity edema   ? 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 Respiratory failure ([place]) 07/27/2019  ? Primary osteoarthritis of right knee 07/24/2019  ? Breast tenderness in male 05/31/2019  ? Chronic pain 05/31/2019  ? AKI (acute  kidney injury) ([place]) 05/31/2019  ? Pain due to onychomycosis of toenails of both feet 05/16/2019  ? Chronic knee pain 04/16/2019  ? Diabetes ([place]) 04/16/2019  ? Esophageal reflux 02/06/2019  ? Heartburn 02/06/2019  ? Chronic skin ulcer of lower leg ([place]) 12/21/2018  ? S/P drug eluting coronary stent placement   ? Coronary artery disease involving native coronary artery of native heart with unstable angina pectoris ([place]) 12/20/2018  ? Unstable angina ([facility])   ? 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  ? Acute on chronic diastolic heart failure ([place])   ? Shortness of breath   ? Precordial chest pain   ? Idiopathic peripheral neuropathy   ? Palpitations   ? Chronic diastolic heart failure ([place])   ? Abnormal ankle brachial index (ABI)   ? Chest pain, rule out acute myocardial infarction 09/26/2018  ? History of cocaine abuse ([place]) 08/20/2018  ? Marijuana use 08/20/2018  ? Morbid obesity with BMI of 40.0-44.9, adult ([place]) 08/20/2018  ? Dyspepsia   ? Morbid obesity ([place])   ? OSA on CPAP   ? Chest pain 05/30/2018  ? CAD (coronary artery disease) 03/30/2018  ? Elevated troponin 02/03/2018  ? Positive urine drug screen 02/03/2018  ? Tobacco abuse 02/03/2018  ? Hyperlipidemia 02/03/2018  ? Acute respiratory failure with hypoxia ([place]) 02/02/2018  ?  Acute congestive heart failure ([place])   ? Keratoconjunctivitis sicca of left eye not specified as Sjogren's 09/06/2017  ? Long term current use of oral hypoglycemic drug 09/06/2017  ? Mechanical ectropion of left lower eyelid 09/06/2017  ? Nuclear sclerotic cataract of both eyes 09/06/2017  ? Refractive amblyopia, left 09/06/2017  ? Type 2 diabetes mellitus without complication, without long-term current use of insulin ([place]) 09/06/2017  ? Hyperopia of both eyes with astigmatism and presbyopia 09/06/2017  ? Atypical chest pain 12/27/2015  ? Essential hypertension   ? Neuropathy   ? ? ?Past Medical History: ?Past Medical History:  ?Diagnosis Date  ? Acute bronchitis 12/28/2015  ? Arthritis   ?  "lebgs" (02/02/2018)  ? Atypical chest pain 12/27/2015  ? Bradycardia   ? a. on 2 week monitor - pauses up to 4.9 sec, requiring cessation of beta blocker.  ? CAD (coronary artery disease)   ? a. 02/06/18  nonobs

## 2021-06-29 ENCOUNTER — Observation Stay (HOSPITAL_COMMUNITY): Payer: Medicaid Other

## 2021-06-29 DIAGNOSIS — R079 Chest pain, unspecified: Secondary | ICD-10-CM | POA: Diagnosis not present

## 2021-06-29 DIAGNOSIS — L03115 Cellulitis of right lower limb: Secondary | ICD-10-CM | POA: Diagnosis present

## 2021-06-29 DIAGNOSIS — Z79899 Other long term (current) drug therapy: Secondary | ICD-10-CM | POA: Diagnosis not present

## 2021-06-29 DIAGNOSIS — A4159 Other Gram-negative sepsis: Secondary | ICD-10-CM | POA: Diagnosis present

## 2021-06-29 DIAGNOSIS — I110 Hypertensive heart disease with heart failure: Secondary | ICD-10-CM | POA: Diagnosis present

## 2021-06-29 DIAGNOSIS — G4733 Obstructive sleep apnea (adult) (pediatric): Secondary | ICD-10-CM | POA: Diagnosis present

## 2021-06-29 DIAGNOSIS — Z86711 Personal history of pulmonary embolism: Secondary | ICD-10-CM | POA: Diagnosis not present

## 2021-06-29 DIAGNOSIS — M7989 Other specified soft tissue disorders: Secondary | ICD-10-CM | POA: Diagnosis not present

## 2021-06-29 DIAGNOSIS — F17290 Nicotine dependence, other tobacco product, uncomplicated: Secondary | ICD-10-CM | POA: Diagnosis present

## 2021-06-29 DIAGNOSIS — R509 Fever, unspecified: Secondary | ICD-10-CM | POA: Diagnosis not present

## 2021-06-29 DIAGNOSIS — J9811 Atelectasis: Secondary | ICD-10-CM | POA: Diagnosis not present

## 2021-06-29 DIAGNOSIS — Z6841 Body Mass Index (BMI) 40.0 and over, adult: Secondary | ICD-10-CM | POA: Diagnosis not present

## 2021-06-29 DIAGNOSIS — Z20822 Contact with and (suspected) exposure to covid-19: Secondary | ICD-10-CM | POA: Diagnosis not present

## 2021-06-29 DIAGNOSIS — E854 Organ-limited amyloidosis: Secondary | ICD-10-CM | POA: Diagnosis present

## 2021-06-29 DIAGNOSIS — Z8249 Family history of ischemic heart disease and other diseases of the circulatory system: Secondary | ICD-10-CM | POA: Diagnosis not present

## 2021-06-29 DIAGNOSIS — I2510 Atherosclerotic heart disease of native coronary artery without angina pectoris: Secondary | ICD-10-CM | POA: Diagnosis present

## 2021-06-29 DIAGNOSIS — E1151 Type 2 diabetes mellitus with diabetic peripheral angiopathy without gangrene: Secondary | ICD-10-CM | POA: Diagnosis present

## 2021-06-29 DIAGNOSIS — Z7901 Long term (current) use of anticoagulants: Secondary | ICD-10-CM | POA: Diagnosis not present

## 2021-06-29 DIAGNOSIS — J449 Chronic obstructive pulmonary disease, unspecified: Secondary | ICD-10-CM | POA: Diagnosis present

## 2021-06-29 DIAGNOSIS — Z823 Family history of stroke: Secondary | ICD-10-CM | POA: Diagnosis not present

## 2021-06-29 DIAGNOSIS — N390 Urinary tract infection, site not specified: Secondary | ICD-10-CM | POA: Diagnosis not present

## 2021-06-29 DIAGNOSIS — Z833 Family history of diabetes mellitus: Secondary | ICD-10-CM | POA: Diagnosis not present

## 2021-06-29 DIAGNOSIS — J189 Pneumonia, unspecified organism: Secondary | ICD-10-CM | POA: Diagnosis not present

## 2021-06-29 DIAGNOSIS — F17210 Nicotine dependence, cigarettes, uncomplicated: Secondary | ICD-10-CM | POA: Diagnosis present

## 2021-06-29 DIAGNOSIS — J9600 Acute respiratory failure, unspecified whether with hypoxia or hypercapnia: Secondary | ICD-10-CM | POA: Diagnosis not present

## 2021-06-29 DIAGNOSIS — Z5329 Procedure and treatment not carried out because of patient's decision for other reasons: Secondary | ICD-10-CM | POA: Diagnosis present

## 2021-06-29 DIAGNOSIS — J811 Chronic pulmonary edema: Secondary | ICD-10-CM | POA: Diagnosis not present

## 2021-06-29 DIAGNOSIS — I25110 Atherosclerotic heart disease of native coronary artery with unstable angina pectoris: Secondary | ICD-10-CM | POA: Diagnosis present

## 2021-06-29 DIAGNOSIS — J9690 Respiratory failure, unspecified, unspecified whether with hypoxia or hypercapnia: Secondary | ICD-10-CM | POA: Diagnosis not present

## 2021-06-29 DIAGNOSIS — A419 Sepsis, unspecified organism: Secondary | ICD-10-CM | POA: Diagnosis not present

## 2021-06-29 DIAGNOSIS — B9689 Other specified bacterial agents as the cause of diseases classified elsewhere: Secondary | ICD-10-CM | POA: Diagnosis present

## 2021-06-29 DIAGNOSIS — I5032 Chronic diastolic (congestive) heart failure: Secondary | ICD-10-CM | POA: Diagnosis present

## 2021-06-29 LAB — BASIC METABOLIC PANEL
Anion gap: 8 (ref 5–15)
BUN: 17 mg/dL (ref 8–23)
CO2: 25 mmol/L (ref 22–32)
Calcium: 8.4 mg/dL — ABNORMAL LOW (ref 8.9–10.3)
Chloride: 102 mmol/L (ref 98–111)
Creatinine, Ser: 1.47 mg/dL — ABNORMAL HIGH (ref 0.61–1.24)
GFR, Estimated: 53 mL/min — ABNORMAL LOW (ref >60)
Glucose, Bld: 99 mg/dL (ref 70–99)
Potassium: 4.2 mmol/L (ref 3.5–5.1)
Sodium: 135 mmol/L (ref 135–145)

## 2021-06-29 LAB — CBC
HCT: 37.5 % — ABNORMAL LOW (ref 39.0–52.0)
Hemoglobin: 12.9 g/dL — ABNORMAL LOW (ref 13.0–17.0)
MCH: 30.9 pg (ref 26.0–34.0)
MCHC: 34.4 g/dL (ref 30.0–36.0)
MCV: 89.7 fL (ref 80.0–100.0)
Platelets: 201 10*3/uL (ref 150–400)
RBC: 4.18 MIL/uL — ABNORMAL LOW (ref 4.22–5.81)
RDW: 16 % — ABNORMAL HIGH (ref 11.5–15.5)
WBC: 25.5 10*3/uL — ABNORMAL HIGH (ref 4.0–10.5)
nRBC: 0 % (ref 0.0–0.2)

## 2021-06-29 LAB — MRSA NEXT GEN BY PCR, NASAL: MRSA by PCR Next Gen: NOT DETECTED

## 2021-06-29 MED ORDER — IBUPROFEN 600 MG PO TABS
600.0000 mg | ORAL_TABLET | Freq: Once | ORAL | Status: AC
  Administered 2021-06-29: 600 mg via ORAL
  Filled 2021-06-29: qty 1

## 2021-06-29 MED ORDER — ONDANSETRON HCL 40 MG/20ML IJ SOLN: 8.0000 mg | Freq: Once | INTRAMUSCULAR | Status: DC

## 2021-06-29 MED ORDER — ONDANSETRON HCL 4 MG/2ML IJ SOLN
4.0000 mg | Freq: Once | INTRAMUSCULAR | Status: AC
  Administered 2021-06-29: 4 mg via INTRAVENOUS
  Filled 2021-06-29: qty 2

## 2021-06-29 MED ORDER — TRIAMCINOLONE ACETONIDE 0.1 % EX OINT
1.0000 "application " | TOPICAL_OINTMENT | Freq: Two times a day (BID) | CUTANEOUS | Status: DC | PRN
  Administered 2021-06-29 – 2021-06-30 (×2): 1 via TOPICAL
  Filled 2021-06-29: qty 15

## 2021-06-29 MED ORDER — APIXABAN 5 MG PO TABS
5.0000 mg | ORAL_TABLET | Freq: Two times a day (BID) | ORAL | Status: DC
  Administered 2021-06-29 – 2021-06-30 (×2): 5 mg via ORAL
  Filled 2021-06-29 (×2): qty 1

## 2021-06-29 MED ORDER — LACTATED RINGERS IV BOLUS
250.0000 mL | Freq: Once | INTRAVENOUS | Status: AC
  Administered 2021-06-29: 250 mL via INTRAVENOUS

## 2021-06-29 MED ORDER — TECHNETIUM TO 99M ALBUMIN AGGREGATED
4.3000 | Freq: Once | INTRAVENOUS | Status: AC | PRN
  Administered 2021-06-29: 4.3 via INTRAVENOUS

## 2021-06-29 MED ORDER — PIPERACILLIN-TAZOBACTAM 3.375 G IVPB 30 MIN
3.3750 g | Freq: Once | INTRAVENOUS | Status: AC
  Administered 2021-06-29: 3.375 g via INTRAVENOUS
  Filled 2021-06-29: qty 50

## 2021-06-29 MED ORDER — IBUPROFEN 600 MG PO TABS: 600.0000 mg | ORAL_TABLET | Freq: Four times a day (QID) | ORAL | Status: DC | PRN

## 2021-06-29 MED ORDER — PIPERACILLIN-TAZOBACTAM 3.375 G IVPB
3.3750 g | Freq: Three times a day (TID) | INTRAVENOUS | Status: DC
  Administered 2021-06-29 – 2021-06-30 (×4): 3.375 g via INTRAVENOUS
  Filled 2021-06-29 (×5): qty 50

## 2021-06-29 MED ORDER — ACETAMINOPHEN 325 MG PO TABS
650.0000 mg | ORAL_TABLET | Freq: Four times a day (QID) | ORAL | Status: DC
  Administered 2021-06-29 – 2021-06-30 (×6): 650 mg via ORAL
  Filled 2021-06-29 (×6): qty 2

## 2021-06-29 NOTE — Progress Notes (Signed)
Pharmacy Antibiotic Note ? ?Russell Collier is a 61 y.o. male admitted on 06/28/2021 with  fever .  Pharmacy has been consulted for Zosyn dosing. Unclear source. WBC up this AM. One dose of Ceftriaxone yesterday.  ? ?Plan: ?Zosyn 3.375G IV q8h to be infused over 4 hours ?Trend WBC, temp, renal function  ?F/U infectious work-up ? ?Height: 5' 10.5" (179.1 cm) ?Weight: (!) 139.7 kg (307 lb 15.7 oz) ?IBW/kg (Calculated) : 74.15 ? ?Temp (24hrs), Avg:101.3 ?F (38.5 ?C), Min:98.9 ?F (37.2 ?C), Max:103 ?F (39.4 ?C) ? ?Recent Labs  ?Lab 06/28/21 ?1428 06/29/21 ?0125  ?WBC 18.9* 25.5*  ?CREATININE 1.29* 1.47*  ?LATICACIDVEN 1.6  --   ?  ?Estimated Creatinine Clearance: 73 mL/min (A) (by C-G formula based on SCr of 1.47 mg/dL (H)).   ? ?Allergies  ?Allergen Reactions  ? Varenicline Anaphylaxis, Swelling and Other (See Comments)  ?  Patient reports laryngospasm stopped in the ER, throat swelling  ? Bupropion Other (See Comments)  ?  Headache - moderate/severe - self discontinued agent   ? Nicoderm [Nicotine] Rash  ? ? ?Narda Bonds, PharmD, BCPS ?Clinical Pharmacist ?Phone: [phone] ? ?

## 2021-06-29 NOTE — Progress Notes (Signed)
?  Transition of Care (TOC) Screening Note ? ? ?Patient Details  ?Name: Russell Collier ?Date of Birth: May 12, 1960 ? ? ?Transition of Care (TOC) CM/SW Contact:    ?Benard Halsted, LCSW ?Phone Number: ?06/29/2021, 10:06 AM ? ? ? ?Transition of Care Department [facility]) has reviewed patient and no TOC needs have been identified at this time. We will continue to monitor patient advancement through interdisciplinary progression rounds. If new patient transition needs arise, please place a TOC consult. ? ? ?

## 2021-06-29 NOTE — Progress Notes (Signed)
Family Medicine Teaching Service ?Daily Progress Note ?Intern Pager: [phone] ? ?Patient name: Khyrin Trevathan Medical record number: [id number] ?Date of birth: December 30, 1960 Age: 61 y.o. Gender: male ? ?Primary Care Provider: [name], MD ?Consultants: None ?Code Status: Full ? ?Pt Overview and Major Events to Date:  ?5/7 - Pt admitted ? ?Assessment and Plan: ? ?Zamarion Longest is a 61 y.o. male presenting headache, fever, chills found to meet 4/4 SIRS criteria, ED suspect respiratory source, dx sepsis.  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  ?  ?Febrile illness meeting SIRS criteria, currently unknown infection source ?Patient febrile to 103 overnight, abx broadened from CFTX to Zosyn. MRSA PCR negative, no need for Vanc coverage. Vitals stable with soft bp to 80-100's/50-60's, sating > 93% on RA. WBC 25.5 (18.9). Urine culture positive for 100,000 colonies of enterobacter, susceptibilities to follow. Blood cultures with NGTD. ?-Vitals per unit ?-Continue CFTX and Zosyn ?-F/u urine Cx susceptibilities ?-250 cc bolus x 1 ?-F/u blood cultures ? ?RLE redness, swelling, edema  Hx PE ?Patient complaining of tenderness to palpation in RLE. Extremity slightly enlarged and more red as compared to left. Marked area with marker, concern for cellulitis, patient already on abx. Also concern for DVT, patient to have DVT u/s today. Hx of PE with PNA previously. V/Q scan showed no scintigraphic evidence of PE. ?-f/u DVT u/s ? ?Familial cardiac amyloidosis  amyloidosis related neuropathy ?-Continue home Tafamidis 61 mg daily and pregablalin 100 mg TID ? ?T2DM ?CBG range 80-90's, well controlled.  ?-Farxiga 10 mg daily ?-F/u A1c ? ?COPD  OSA ?-CPAP qhs ?-Anoro Ellipta 1 puff daily ?-Albuterol prn ? ?HLD ?-atorvastatin 80 mg daily ? ?Other: ?- Continue valacyclovir 500 mg daily; suspect for HSV suppression however cannot find  documentation in chart. ? ?FEN/GI: Regular Diet?? Carb modified?? ?PPx: Eliquis 2/5 mg BID ?Dispo:Home pending clinical improvement .   ? ?Subjective:  ?Patient complaining of leg pain today, notes it's also more swollen then left leg.  ? ?Objective: ?Temp:  [98.9 ?F (37.2 ?C)-103 ?F (39.4 ?C)] 99 ?F (37.2 ?C) (05/08 0405) ?Pulse Rate:  [87-104] 89 (05/08 0405) ?Resp:  [15-27] 19 (05/08 0405) ?BP: (89-164)/(54-93) 103/54 (05/08 0405) ?SpO2:  [92 %-100 %] 95 % (05/08 0405) ?FiO2 (%):  [40 %] 40 % (05/07 2303) ?Weight:  [139.7 kg] 139.7 kg (05/07 1358) ?Physical Exam: ?General: Ill appearing, obese, polite, NAD, African American male ?Cardiovascular: RRR, distant heart sounds 2/2 body habitus ?Respiratory: CTABL ?Abdomen: Soft, NTTP, non-distended ?Extremities: Erythema, warmth, and slight swelling in RLE as compared to LLE.  ? ?Laboratory: ?Recent Labs  ?Lab 06/28/21 ?1428 06/29/21 ?0125  ?WBC 18.9* 25.5*  ?HGB 15.0 12.9*  ?HCT 46.5 37.5*  ?PLT 239 201  ? ?Recent Labs  ?Lab 06/28/21 ?1428 06/29/21 ?0125  ?NA 139 135  ?K 4.6 4.2  ?CL 105 102  ?CO2 25 25  ?BUN 18 17  ?CREATININE 1.29* 1.47*  ?CALCIUM 9.4 8.4*  ?PROT 7.7  --   ?BILITOT 1.0  --   ?ALKPHOS 98  --   ?ALT 33  --   ?AST 28  --   ?GLUCOSE 95 99  ? ? ? ? ?Imaging/Diagnostic Tests: ? ? ?Holley Bouche, MD ?06/29/2021, 6:13 AM ?PGY-1, [facility] ?[place] Intern pager: [phone], text pages welcome ? ?

## 2021-06-29 NOTE — Progress Notes (Signed)
Right lower extremity venous duplex has been completed. ?Preliminary results can be found in CV Proc through chart review.  ?Results were given to the patient's nurse, Erin. ? ?06/29/21 4:37 PM ?Carlos Levering RVT   ?

## 2021-06-29 NOTE — Progress Notes (Addendum)
FPTS Brief Progress Note ? ?S: Received page from RN that patient is having nausea, fever back to 103 after CTX, no clear source. Patient reports that he was in his usual state of health (does report h/o cigarette smoking with wheezing and "breathing treatments"), denies any cough, SOB, dyspnea. Noted he had a bad headache, went to lay down and had rigors, which led him to calling EMS. He reports a pain in his right leg, no erythema or obvious swelling, but does have a h/o PE. He is on a reduced dose of eliquis to 2.5 mg due to frequent epistaxis on full dose AC. Patient reports that last time he was told he had PNA, he did not have dyspnea, no cough, only headache and then he said he was told he had a "blood clot" in his lungs; similar symptoms as to tonight. ? ? ?O: ?BP 121/60 (BP Location: Left Wrist)   Pulse 99   Temp (!) 103 ?F (39.4 ?C) (Oral)   Resp (!) 23   Ht 5' 10.5" (1.791 m)   Wt (!) 139.7 kg   SpO2 92%   BMI 43.57 kg/m?   ?Nursing note and vitals reviewed ?GEN: age-appropriate, AAW, diaphoretic, NAD, class III obesity ?Neck: Supple. No LAD  ?Cardiac: Regular rate and rhythm. Normal S1/S2. No murmurs, rubs, or gallops appreciated. 2+ radial pulses. ?Lungs: No increased WOB, no accessory muscle usage. Mild expiratory wheezing heard in all lung fields. Good air movement. ?Neuro: AOx3  ?R leg: no erythema, edema, TTP in calf ? ?A/P: ?SIRS ?WBC increased from 18 to 25 despite CTX, still no clear source. Expanded coverage to zosyn, appreciate pharmacy assistance. Given extensive h/o PE and reduced dose of AC, with report from patient of similar symptoms for last PE, raises concern for PE. Although overall picture looks like infection, would like to get CT chest/CTA chest to characterize both. However pt has worsening renal function. Discussed with Dr. Gwendlyn Deutscher, will get VQ scan and wait for doppler of leg. ?- Awaiting blood and urine cx ?- Broaden abx to zosyn ?- MRSA PCR ?- Tylenol scheduled q6h ?-  Continue to closely monitor ? ?R leg pain ?- VAS [place] R leg doppler stat when tech available. ? ?Gladys Damme, MD ?06/29/2021, 3:31 AM ?PGY-3, [facility] Night Resident  ?Please page [phone] with questions.  ? ? ?

## 2021-06-30 ENCOUNTER — Other Ambulatory Visit (HOSPITAL_COMMUNITY): Payer: Self-pay

## 2021-06-30 ENCOUNTER — Telehealth (HOSPITAL_BASED_OUTPATIENT_CLINIC_OR_DEPARTMENT_OTHER): Payer: Self-pay | Admitting: Physical Therapy

## 2021-06-30 DIAGNOSIS — N390 Urinary tract infection, site not specified: Secondary | ICD-10-CM

## 2021-06-30 LAB — HEMOGLOBIN A1C
Hgb A1c MFr Bld: 6.5 % — ABNORMAL HIGH (ref 4.8–5.6)
Mean Plasma Glucose: 139.85 mg/dL

## 2021-06-30 LAB — BASIC METABOLIC PANEL
Anion gap: 8 (ref 5–15)
BUN: 22 mg/dL (ref 8–23)
CO2: 23 mmol/L (ref 22–32)
Calcium: 8.3 mg/dL — ABNORMAL LOW (ref 8.9–10.3)
Chloride: 104 mmol/L (ref 98–111)
Creatinine, Ser: 1.47 mg/dL — ABNORMAL HIGH (ref 0.61–1.24)
GFR, Estimated: 53 mL/min — ABNORMAL LOW (ref >60)
Glucose, Bld: 122 mg/dL — ABNORMAL HIGH (ref 70–99)
Potassium: 3.8 mmol/L (ref 3.5–5.1)
Sodium: 135 mmol/L (ref 135–145)

## 2021-06-30 LAB — URINE CULTURE: Culture: >=100000 — AB

## 2021-06-30 LAB — CBC
HCT: 35.5 % — ABNORMAL LOW (ref 39.0–52.0)
Hemoglobin: 12.2 g/dL — ABNORMAL LOW (ref 13.0–17.0)
MCH: 30.9 pg (ref 26.0–34.0)
MCHC: 34.4 g/dL (ref 30.0–36.0)
MCV: 89.9 fL (ref 80.0–100.0)
Platelets: 179 10*3/uL (ref 150–400)
RBC: 3.95 MIL/uL — ABNORMAL LOW (ref 4.22–5.81)
RDW: 16.3 % — ABNORMAL HIGH (ref 11.5–15.5)
WBC: 13.8 10*3/uL — ABNORMAL HIGH (ref 4.0–10.5)
nRBC: 0 % (ref 0.0–0.2)

## 2021-06-30 MED ORDER — FOSFOMYCIN TROMETHAMINE 3 G PO PACK
3.0000 g | PACK | Freq: Once | ORAL | Status: DC
  Filled 2021-06-30: qty 3

## 2021-06-30 MED ORDER — CEPHALEXIN 500 MG PO CAPS
500.0000 mg | ORAL_CAPSULE | Freq: Two times a day (BID) | ORAL | Status: DC
  Administered 2021-06-30: 500 mg via ORAL
  Filled 2021-06-30: qty 1

## 2021-06-30 NOTE — Progress Notes (Addendum)
Family Medicine Teaching Service ?Daily Progress Note ?Intern Pager: [phone] ? ?Patient name: Russell Collier Medical record number: [id number] ?Date of birth: 03-19-1960 Age: 61 y.o. Gender: male ? ?Primary Care Provider: [name], MD ?Consultants: None ?Code Status: Full ? ?Pt Overview and Major Events to Date:  ?5/7 - Pt admitted ? ?Assessment and Plan: ? ?Albin Duckett is a 61 y.o. male presenting headache, fever, chills found to meet 4/4 SIRS criteria, ED suspect respiratory source, dx sepsis.  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  ?  ?Enterobacter UTI  Sepsis ?Patient afebrile overnight, with stable vitals. WBC improved 13.8 (25.5). Urine positive for Enterobacter 100,000 colonies, awaiting susceptibility. Bcx NGTD. Susceptibility returned, likely will switch to cefdinir, pharmacy team to assist selection of antibiotic for coverage.  ?-Continue CFTX and Zosyn ?-Consider narrowing coverage to cefdinir given susceptibilities ?-Vitals per unit ?-F/u urine susceptibilities ?-F/u blood cultures ? ?RLE redness, swelling, redness Cellulitis  Hx PE ?No evidence of DVT on U/S or PE on V/Q scan. RLE exam stable from yesterday, patient continues to complain of tenderness and redness. New lesion in right upper thigh area. Lesions concerning for cellulitis, looks like covered by antibiotics.  ? ? ? ?-Covered by current antibiotics, will continue ? ?Familial cardiac amyloidosis  amyloidosis related neuropathy ?-Tafamidis 61 mg daily, pregabalin 100 mg TID ? ?T2DM ?A1c 6.5, CBG well controlled 122 ?-Farxiga '10mg'$  daily ? ?COPD  OSA ?-CPAP qhs ?-Anoro Ellipta ?-Albuterol prn ? ?HLD ?-atorvastatin 80 mg daily ? ? ?FEN/GI: Carb modified ?PPx: Eliquis 5 BID ?Dispo:Home in 2-3 days.   ? ?Subjective:  ?Patient is feeling well, but complaining of new areas of redness and tenderness on right extremity. Pictures in  chart ? ?Objective: ?Temp:  [97.7 ?F (36.5 ?C)-98.6 ?F (37 ?C)] 98.1 ?F (36.7 ?C) (05/09 3888) ?Pulse Rate:  [60-76] 66 (05/09 0317) ?Resp:  [16-20] 16 (05/09 0317) ?BP: (100-155)/(52-94) 112/67 (05/09 0317) ?SpO2:  [94 %-98 %] 98 % (05/09 0317) ?Physical Exam: ?General: Well appearing, conversational, jovial, NAD, obese, African American male ?Cardiovascular: Distant heart sounds 2/2 body habitus ?Respiratory: CTABL ?Abdomen: Soft, NTTP, non-distended ?Extremities: notable TTP, warmth, and redness on right LE in thigh and calf/shin area ? ?Laboratory: ?Recent Labs  ?Lab 06/28/21 ?1428 06/29/21 ?0125 06/30/21 ?0248  ?WBC 18.9* 25.5* 13.8*  ?HGB 15.0 12.9* 12.2*  ?HCT 46.5 37.5* 35.5*  ?PLT 239 201 179  ? ?Recent Labs  ?Lab 06/28/21 ?1428 06/29/21 ?0125 06/30/21 ?0248  ?NA 139 135 135  ?K 4.6 4.2 3.8  ?CL 105 102 104  ?CO2 '25 25 23  '$ ?BUN '18 17 22  '$ ?CREATININE 1.29* 1.47* 1.47*  ?CALCIUM 9.4 8.4* 8.3*  ?PROT 7.7  --   --   ?BILITOT 1.0  --   --   ?ALKPHOS 98  --   --   ?ALT 33  --   --   ?AST 28  --   --   ?GLUCOSE 95 99 122*  ? ? ? ? ?Imaging/Diagnostic Tests: ? ? ?Holley Bouche, MD ?06/30/2021, 5:55 AM ?PGY-1, [facility] ?[facility] Intern pager: [phone], text pages welcome ? ?

## 2021-06-30 NOTE — Hospital Course (Addendum)
Russell Collier is a 61 y.o. male presenting headache, fever, chills found to meet 4/4 SIRS criteria, ED suspect respiratory source, dx sepsis.  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 His hospital course is detailed below.  ?  ?Enterobacter UTI  Sepsis ?Patient had acute onset febrile illness with fevers and chills, less than 8-hour duration at time of admission.  Patient met SIRS criteria with Tmax 103 ?F, WBC 18, tachypnea to 27, and low tachycardia to 104.  Urine culture was positive for Enterobacter UTI.  Patient received ceftriaxone 2 g once and Zosyn x2 days by the time sensitivities came back for the urine culture.  Plan was made to transition to fosfomycin 3 g once to treat the UTI, however patient left AMA prior to receiving this medication.  He reportedly told the nurse "I received all the antibiotics I need, if I need anymore I will come back to [facility] or [place] long." ? ?R. Leg Cellulitis ?While admitted patient complained of lower extremity tenderness to palpation warmth and swelling concerning for cellulitis.  Patient had 2 sites of concern 1 on upper thigh 1 on lower extremity about shin/calf.  Plan was to treat cellulitis with Keflex 500 mg twice daily x7 days, however patient left AMA prior to receiving the prescription.  He received 1 dose of Keflex before leaving the hospital. ? ?All other conditions chronic and stable  ? ?Discharge recommendations: ?A1c 6.5, PCP to follow up ?Patient left AMA prior to receiving planned fosfomycin 3 g for the UTI. ?Patient left AMA prior to receiving planned Keflex 500 mg twice daily x7 days prescription.  He only received 1 dose of Keflex prior to leaving AMA.  PCP or subsequent provider could elect to treat with this antibiotic plan if he returns for care. ?

## 2021-06-30 NOTE — Progress Notes (Signed)
Pt walked up to nurse's station stating he is ready to go home. Pt has his bags packed and has taken off his tele box. Nurse asked pt why does he want to left and pt replied, "because I have stuff to do and take care of at home." Nurse encouraged pt to wait until he is medically stable before leaving and reviewed with him risks of leaving but pt adamant he is leaving. MD paged and made aware, MD stated he would come speak with pt.  ?

## 2021-06-30 NOTE — Telephone Encounter (Signed)
Patient had requested a return phone call.  Called patient; left voice mail inquiring if he will be able to attend PT on 5/15.  Informed patient that his appointment will be on land (not aquatic therapy) for reassessment since hospitalization, and he will need MD clearance to return to PT (per supervising PT, Ronny Flurry).   Left phone number requesting he return phone call : [phone] ? ?Kerin Perna, PTA ?06/30/21 3:56 PM ? ?

## 2021-06-30 NOTE — Progress Notes (Signed)
FPTS Brief Progress Note ? ?S: Patient is asleep in bed ? ?O: ?BP 112/67 (BP Location: Left Arm)   Pulse 66   Temp 98.1 ?F (36.7 ?C) (Oral)   Resp 16   Ht 5' 10.5" (1.791 m)   Wt (!) 139.7 kg   SpO2 98%   BMI 43.57 kg/m?   ?GEN: Resting comfortably in bed  ?Resp: Breathing comfortably on room air ? ?A/P: ?- Orders reviewed. Labs for AM ordered, which was adjusted as needed.  ? ? ?Gerrit Heck, MD ?06/30/2021, 4:46 AM ?PGY-1, [facility] Night Resident  ?Please page [phone] with questions.  ?  ?

## 2021-06-30 NOTE — Progress Notes (Signed)
PT Cancellation Note ? ?Patient Details ?Name: Russell Collier ?MRN: [id number] ?DOB: 09-07-1960 ? ? ?Cancelled Treatment:    Reason Eval/Treat Not Completed: PT screened, no needs identified, will sign off ? ?Patient observed up walking in hall with no assist. Patient talking to nurse about potentially leaving AMA. No PT needs identified with PT signing off.  ? ? ?Arby Barrette, PT ?Acute Rehabilitation Services  ?Pager [phone] ?Office [phone] ? ?Jeanie Cooks [place] ?06/30/2021, 2:09 PM ?

## 2021-06-30 NOTE — Discharge Summary (Signed)
Family Medicine Teaching Service ?AMA Summary ? ?Patient name: Russell Collier Medical record number: [id number] ?Date of birth: [birth year] Age: 61 y.o. Gender: male ?Date of Admission: 06/28/2021  Date of leaving AMA: 06/30/2021 ?Admitting Physician: Kinnie Feil, MD ? ?Primary Care Provider: [name], MD ?Consultants: None ? ?Indication for Hospitalization: Urosepsis, cellulitis ? ?Discharge Diagnoses/Problem List:  ?Enterobacter urosepsis ?Cellulitis of right leg ?Familial cardiac amyloidosis ?Amyloidosis related neuropathy ?T2DM ?COPD ?OSA ? ?Disposition: Home ? ?AMA Condition: Stable ? ?Day of AMA exam:  ?(Originally documented in Daily progress note by Dr. Holley Bouche) ?Temp:  [97.7 ?F (36.5 ?C)-98.6 ?F (37 ?C)] 98.1 ?F (36.7 ?C) (05/09 8938) ?Pulse Rate:  [60-76] 66 (05/09 0317) ?Resp:  [16-20] 16 (05/09 0317) ?BP: (100-155)/(52-94) 112/67 (05/09 0317) ?SpO2:  [94 %-98 %] 98 % (05/09 0317) ?Physical Exam: ?General: Well appearing, conversational, jovial, NAD, obese, African American male ?Cardiovascular: Distant heart sounds 2/2 body habitus ?Respiratory: CTABL ?Abdomen: Soft, NTTP, non-distended ?Extremities: notable TTP, warmth, and redness on right LE in thigh and calf/shin area ? ?Brief Hospital Course:  ?Russell Collier is a 61 y.o. male presenting headache, fever, chills found to meet 4/4 SIRS criteria, ED suspect respiratory source, dx sepsis.  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 His hospital course is detailed below.  ?  ?Enterobacter UTI  Sepsis ?Patient had acute onset febrile illness with fevers and chills, less than 8-hour duration at time of admission.  Patient met SIRS criteria with Tmax 103 ?F, WBC 18, tachypnea to 27, and low tachycardia to 104.  Urine culture was positive for Enterobacter UTI.  Patient received ceftriaxone 2 g once and Zosyn x2 days by the time sensitivities came  back for the urine culture.  Plan was made to transition to fosfomycin 3 g once to treat the UTI, however patient left AMA prior to receiving this medication.  He reportedly told the nurse "I received all the antibiotics I need, if I need anymore I will come back to [facility] or [place] long." ? ?R. Leg Cellulitis ?While admitted patient complained of lower extremity tenderness to palpation warmth and swelling concerning for cellulitis.  Patient had 2 sites of concern 1 on upper thigh 1 on lower extremity about shin/calf.  Plan was to treat cellulitis with Keflex 500 mg twice daily x7 days, however patient left AMA prior to receiving the prescription.  He received 1 dose of Keflex before leaving the hospital. ? ?All other conditions chronic and stable  ? ?Discharge recommendations: ?A1c 6.5, PCP to follow up ?Patient left AMA prior to receiving planned fosfomycin 3 g for the UTI. ?Patient left AMA prior to receiving planned Keflex 500 mg twice daily x7 days prescription.  He only received 1 dose of Keflex prior to leaving AMA.  PCP or subsequent provider could elect to treat with this antibiotic plan if he returns for care. ? ?Significant Procedures: None ? ?Significant Labs and Imaging:  ?Recent Labs  ?Lab 06/28/21 ?1428 06/29/21 ?0125 06/30/21 ?0248  ?WBC 18.9* 25.5* 13.8*  ?HGB 15.0 12.9* 12.2*  ?HCT 46.5 37.5* 35.5*  ?PLT 239 201 179  ? ?Recent Labs  ?Lab 06/28/21 ?1428 06/29/21 ?0125 06/30/21 ?0248  ?NA 139 135 135  ?K 4.6 4.2 3.8  ?CL 105 102 104  ?CO2 25 25 23   ?GLUCOSE 95 99 122*  ?BUN 18 17 22   ?CREATININE 1.29* 1.47* 1.47*  ?CALCIUM 9.4 8.4* 8.3*  ?ALKPHOS 98  --   --   ?  AST 28  --   --   ?ALT 33  --   --   ?ALBUMIN 4.2  --   --   ? ?Urine culture: ?Received ceftriaxone 2 g x 1, ? ?Results/Tests Pending at Time of Discharge:  ?Blood cultures (at time of AMA, blood cultures were no growth at 2 days) ? ?Discharge Medications:  ?At time of patient leaving AMA, we were trying to transition him from IV to p.o.  antibiotics.  Plan was to give him fosfomycin 3 g once to treat the UTI and Keflex 500 mg twice daily x7 days.  However, patient left before receiving the fosfomycin and only received 1 dose of Keflex 500 mg. ? ?Discharge Instructions: Please refer to Patient Instructions section of EMR for full details.  Patient was counseled important signs and symptoms that should prompt return to medical care, changes in medications, dietary instructions, activity restrictions, and follow up appointments.  ? ?Follow-Up Appointments: ?None ? ?Ezequiel Essex, MD ?06/30/2021, 7:01 PM ?PGY-2, [facility]  ?

## 2021-06-30 NOTE — Progress Notes (Signed)
Received page that patient wanting to d/c AMA, from nurse. Went to see patient, he reports that he needs to leave early to get some business handled/obligations and appointments he has. Informed patient that we are still determining the antibiotics to treat his cellulitis and UTI.  Patient willing to stay longer if he could go outside for a bit today. Spoke with charge nurse who found staff member to go outside with patient on their lunch break.  Also told patient we would attempt to discharge him early tomorrow around noon to 1 PM, so he could make his obligations.  Patient agreeable to this plan. ? ?Holley Bouche, MD ?FPTS - PGY1 ?

## 2021-06-30 NOTE — Progress Notes (Signed)
Nurse went into pt room making rounds and to medicate. Pt had packed his bags again and took off his tele box. Pt stated he needed to leave right now because he has business to take care at home. Nurse educated pt on importance of staying and risks of leaving against medical advice. Nurse paged MD. Pt was adamant he was leaving and stated his ride was waiting downstairs. Nurse removed pt's PIV and pt signed AMA paperwork.  ? ?MD responded to page shortly after but pt had left floor. MD made aware.  ?

## 2021-07-01 ENCOUNTER — Ambulatory Visit (HOSPITAL_BASED_OUTPATIENT_CLINIC_OR_DEPARTMENT_OTHER): Payer: Medicaid Other | Admitting: Physical Therapy

## 2021-07-01 DIAGNOSIS — M5136 Other intervertebral disc degeneration, lumbar region: Secondary | ICD-10-CM | POA: Diagnosis not present

## 2021-07-02 ENCOUNTER — Ambulatory Visit: Payer: Medicaid Other | Admitting: Plastic Surgery

## 2021-07-02 ENCOUNTER — Other Ambulatory Visit (HOSPITAL_COMMUNITY): Payer: Self-pay

## 2021-07-02 ENCOUNTER — Other Ambulatory Visit (HOSPITAL_COMMUNITY)
Admission: RE | Admit: 2021-07-02 | Discharge: 2021-07-02 | Disposition: A | Discharge status: Home / Self Care | Payer: Medicaid Other | Source: Ambulatory Visit | Attending: Plastic Surgery | Admitting: Plastic Surgery

## 2021-07-02 VITALS — BP 119/70 | HR 72 | Ht 70.5 in | Wt 312.8 lb

## 2021-07-02 DIAGNOSIS — L723 Sebaceous cyst: Secondary | ICD-10-CM | POA: Insufficient documentation

## 2021-07-02 DIAGNOSIS — M5459 Other low back pain: Secondary | ICD-10-CM | POA: Diagnosis not present

## 2021-07-02 DIAGNOSIS — M25674 Stiffness of right foot, not elsewhere classified: Secondary | ICD-10-CM | POA: Diagnosis present

## 2021-07-02 DIAGNOSIS — M6281 Muscle weakness (generalized): Secondary | ICD-10-CM | POA: Diagnosis present

## 2021-07-02 DIAGNOSIS — M25671 Stiffness of right ankle, not elsewhere classified: Secondary | ICD-10-CM | POA: Diagnosis present

## 2021-07-02 DIAGNOSIS — L720 Epidermal cyst: Secondary | ICD-10-CM

## 2021-07-02 DIAGNOSIS — M79671 Pain in right foot: Secondary | ICD-10-CM | POA: Diagnosis present

## 2021-07-02 DIAGNOSIS — M5136 Other intervertebral disc degeneration, lumbar region: Secondary | ICD-10-CM | POA: Diagnosis not present

## 2021-07-02 NOTE — Progress Notes (Signed)
Operative Note  ? ?DATE OF OPERATION: 07/02/2021 ? ?LOCATION:   ? ?SURGICAL DEPARTMENT: Plastic Surgery ? ?PREOPERATIVE DIAGNOSES: Left nasal skin lesion ? ?POSTOPERATIVE DIAGNOSES:  same ? ?PROCEDURE:  ?Excision of left nasal skin lesion measuring 1.5 cm ?Complex closure measuring 1.5 cm ? ?SURGEON: Talmadge Coventry, MD ? ?ANESTHESIA:  Local ? ?COMPLICATIONS: None.  ? ?INDICATIONS FOR PROCEDURE:  ?The patient, Russell Collier is a 61 y.o. male born on March 19, 1960, is here for treatment of left nasal skin lesion ?MRN: [id number] ? ?CONSENT:  ?Informed consent was obtained directly from the patient. Risks, benefits and alternatives were fully discussed. Specific risks including but not limited to bleeding, infection, hematoma, seroma, scarring, pain, infection, wound healing problems, and need for further surgery were all discussed. The patient did have an ample opportunity to have questions answered to satisfaction.  ? ?DESCRIPTION OF PROCEDURE:  ?Local anesthesia was administered. The patient's operative site was prepped and draped in a sterile fashion. A time out was performed and all information was confirmed to be correct.  The lesion was excised with a 15 blade.  Hemostasis was obtained.  Circumferential undermining was performed and the skin was advanced and closed in layers with interrupted buried Monocryl sutures and 5-0 Monocryl for the skin.  The lesion excised measured 1.5 cm, and the total length of closure measured 1.5 cm.   ? ?The patient tolerated the procedure well.  There were no complications. ?  ? ?

## 2021-07-03 ENCOUNTER — Ambulatory Visit (INDEPENDENT_AMBULATORY_CARE_PROVIDER_SITE_OTHER): Payer: Medicaid Other | Admitting: Family Medicine

## 2021-07-03 ENCOUNTER — Encounter: Payer: Self-pay | Admitting: Family Medicine

## 2021-07-03 VITALS — BP 106/63 | HR 78 | Temp 98.8°F | Ht 70.5 in | Wt 310.8 lb

## 2021-07-03 DIAGNOSIS — R200 Anesthesia of skin: Secondary | ICD-10-CM | POA: Diagnosis present

## 2021-07-03 DIAGNOSIS — N390 Urinary tract infection, site not specified: Secondary | ICD-10-CM | POA: Diagnosis not present

## 2021-07-03 DIAGNOSIS — L03115 Cellulitis of right lower limb: Secondary | ICD-10-CM | POA: Diagnosis not present

## 2021-07-03 DIAGNOSIS — G629 Polyneuropathy, unspecified: Secondary | ICD-10-CM

## 2021-07-03 DIAGNOSIS — M5136 Other intervertebral disc degeneration, lumbar region: Secondary | ICD-10-CM | POA: Diagnosis not present

## 2021-07-03 LAB — CULTURE, BLOOD (ROUTINE X 2)
Culture: NO GROWTH
Culture: NO GROWTH
Special Requests: ADEQUATE

## 2021-07-03 MED ORDER — FOSFOMYCIN TROMETHAMINE 3 G PO PACK: 3.0000 g | PACK | Freq: Once | ORAL | 0 refills | Status: AC

## 2021-07-03 MED ORDER — CEPHALEXIN 500 MG PO CAPS: 500.0000 mg | ORAL_CAPSULE | Freq: Three times a day (TID) | ORAL | 0 refills | Status: DC

## 2021-07-03 NOTE — Patient Instructions (Signed)
It was good seeing you today.  I am worried about your leg infection and have sent a prescription of antibiotics to your pharmacy which she will take 3 times daily.  Regarding your UTI I sent a medication called fosfomycin which she will take once and that should treat the UTI.  For your ambulation issues as placed a referral for physical therapy.  I want you to be seen in 1 week in our clinic to evaluate your leg cellulitis.  If your symptoms worsen, you start having fever that does not respond to Tylenol or the pain worsens I want you to be evaluated in the emergency department.  If you have any issues give me a call.  I hope you have a great day! ? ? ?

## 2021-07-03 NOTE — Progress Notes (Signed)
? ? ?  SUBJECTIVE:  ? ?CHIEF COMPLAINT / HPI:  ? ?Hospital fu  ?Patient reports for hospital follow-up.  He was recently admitted meeting sepsis criteria.  He left AGAINST MEDICAL ADVICE because he wanted to have a procedure done on his nose which she has been trying to have done for years and unable to do so until now.  Procedure went well.  Since discharge patient reports that he has been doing well although the redness and swelling in his right lower extremity has remained pretty stable and not improved.  Denies any fevers.  Patient did not receive any antibiotics at discharge.  They are planning on fosfomycin prior to discharge but did not get it. ? ?Right lower extremity cellulitis ?Patient denies any drainage from his right lower extremity.  Does report pain, erythema, warmth.  Is worried that he has infection spreading.  Understands that he should have been discharged on medications but did not get them because he left AGAINST MEDICAL ADVICE.  Would like those medications at this time.  Due to the lower extremity pain is having difficulty walking and would like referral to physical therapy. ? ? ?OBJECTIVE:  ? ?BP 106/63   Pulse 78   Temp 98.8 ?F (37.1 ?C) (Oral)   Ht 5' 10.5" (1.791 m)   Wt (!) 310 lb 12.8 oz (141 kg)   SpO2 98%   BMI 43.96 kg/m?   ?General: Obese 61 year old male in no acute distress ?Cardiac: Regular rate and rhythm ?Respiratory: Normal for breathing, speaking in full sentences ?MSK: Patient with erythema, edema of the right lower extremity mainly in his calf and thigh, does not extend to the ankle.  It is warm to touch.  No drainage appreciated. ? ? ? ? ?ASSESSMENT/PLAN:  ? ?Cellulitis of right lower extremity ?Patient presenting with physical exam and symptoms consistent with right lower extremity cellulitis.  Patient was supposed to be discharged on Keflex for 7 days but left before receiving this prescription.  Prescription sent for Keflex 500 mg 3 times daily for 7 days.  Strict  ED and return precautions given.  Follow-up in 1 week. ? ?Urinary tract infection without hematuria ?Patient was recently admitted to the hospital meeting sepsis criteria.  Source was thought to be urinary tract infection growing Enterobacter in his urine.  Was supposed to receive a dose of fosfomycin but left prior to receiving this dose.  Prescription sent for fosfomycin one-time dose.  Strict ED return precautions given. ?  ? ? ?[name], MD ?[place]  ? ?

## 2021-07-04 DIAGNOSIS — L03115 Cellulitis of right lower limb: Secondary | ICD-10-CM | POA: Insufficient documentation

## 2021-07-04 NOTE — Assessment & Plan Note (Signed)
Patient was recently admitted to the hospital meeting sepsis criteria.  Source was thought to be urinary tract infection growing Enterobacter in his urine.  Was supposed to receive a dose of fosfomycin but left prior to receiving this dose.  Prescription sent for fosfomycin one-time dose.  Strict ED return precautions given. ?

## 2021-07-04 NOTE — Assessment & Plan Note (Signed)
Patient presenting with physical exam and symptoms consistent with right lower extremity cellulitis.  Patient was supposed to be discharged on Keflex for 7 days but left before receiving this prescription.  Prescription sent for Keflex 500 mg 3 times daily for 7 days.  Strict ED and return precautions given.  Follow-up in 1 week. ?
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
## 2021-07-06 ENCOUNTER — Ambulatory Visit (HOSPITAL_BASED_OUTPATIENT_CLINIC_OR_DEPARTMENT_OTHER): Payer: Medicaid Other | Admitting: Physical Therapy

## 2021-07-06 ENCOUNTER — Encounter (HOSPITAL_BASED_OUTPATIENT_CLINIC_OR_DEPARTMENT_OTHER): Payer: Self-pay

## 2021-07-06 ENCOUNTER — Other Ambulatory Visit (HOSPITAL_COMMUNITY): Payer: Self-pay

## 2021-07-06 DIAGNOSIS — M5136 Other intervertebral disc degeneration, lumbar region: Secondary | ICD-10-CM | POA: Diagnosis not present

## 2021-07-06 DIAGNOSIS — M79671 Pain in right foot: Secondary | ICD-10-CM

## 2021-07-06 DIAGNOSIS — M25674 Stiffness of right foot, not elsewhere classified: Secondary | ICD-10-CM

## 2021-07-06 DIAGNOSIS — M6281 Muscle weakness (generalized): Secondary | ICD-10-CM

## 2021-07-06 DIAGNOSIS — M5459 Other low back pain: Secondary | ICD-10-CM

## 2021-07-06 DIAGNOSIS — M25671 Stiffness of right ankle, not elsewhere classified: Secondary | ICD-10-CM

## 2021-07-06 DIAGNOSIS — R262 Difficulty in walking, not elsewhere classified: Secondary | ICD-10-CM

## 2021-07-06 LAB — SURGICAL PATHOLOGY
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
## 2021-07-07 DIAGNOSIS — M5136 Other intervertebral disc degeneration, lumbar region: Secondary | ICD-10-CM | POA: Diagnosis not present

## 2021-07-08 ENCOUNTER — Ambulatory Visit (HOSPITAL_BASED_OUTPATIENT_CLINIC_OR_DEPARTMENT_OTHER): Payer: Medicaid Other | Admitting: Physical Therapy

## 2021-07-08 DIAGNOSIS — M5136 Other intervertebral disc degeneration, lumbar region: Secondary | ICD-10-CM | POA: Diagnosis not present

## 2021-07-09 ENCOUNTER — Other Ambulatory Visit: Payer: Self-pay

## 2021-07-09 ENCOUNTER — Telehealth: Payer: Self-pay

## 2021-07-09 ENCOUNTER — Inpatient Hospital Stay (HOSPITAL_COMMUNITY)
Admission: EM | Admit: 2021-07-09 | Discharge: 2021-07-13 | DRG: 603 | Disposition: A | Discharge status: Home / Self Care | Payer: Medicaid Other | Attending: Family Medicine | Admitting: Family Medicine

## 2021-07-09 ENCOUNTER — Encounter (HOSPITAL_COMMUNITY): Payer: Self-pay

## 2021-07-09 DIAGNOSIS — M5136 Other intervertebral disc degeneration, lumbar region: Secondary | ICD-10-CM | POA: Diagnosis not present

## 2021-07-09 DIAGNOSIS — I2510 Atherosclerotic heart disease of native coronary artery without angina pectoris: Secondary | ICD-10-CM | POA: Diagnosis present

## 2021-07-09 DIAGNOSIS — I5032 Chronic diastolic (congestive) heart failure: Secondary | ICD-10-CM | POA: Diagnosis present

## 2021-07-09 DIAGNOSIS — Z79899 Other long term (current) drug therapy: Secondary | ICD-10-CM

## 2021-07-09 DIAGNOSIS — B009 Herpesviral infection, unspecified: Secondary | ICD-10-CM | POA: Diagnosis present

## 2021-07-09 DIAGNOSIS — I4891 Unspecified atrial fibrillation: Secondary | ICD-10-CM | POA: Diagnosis present

## 2021-07-09 DIAGNOSIS — Z793 Long term (current) use of hormonal contraceptives: Secondary | ICD-10-CM | POA: Diagnosis not present

## 2021-07-09 DIAGNOSIS — E1151 Type 2 diabetes mellitus with diabetic peripheral angiopathy without gangrene: Secondary | ICD-10-CM | POA: Diagnosis present

## 2021-07-09 DIAGNOSIS — Z8249 Family history of ischemic heart disease and other diseases of the circulatory system: Secondary | ICD-10-CM

## 2021-07-09 DIAGNOSIS — J449 Chronic obstructive pulmonary disease, unspecified: Secondary | ICD-10-CM | POA: Diagnosis present

## 2021-07-09 DIAGNOSIS — I110 Hypertensive heart disease with heart failure: Secondary | ICD-10-CM | POA: Diagnosis present

## 2021-07-09 DIAGNOSIS — D649 Anemia, unspecified: Secondary | ICD-10-CM | POA: Diagnosis present

## 2021-07-09 DIAGNOSIS — Z6841 Body Mass Index (BMI) 40.0 and over, adult: Secondary | ICD-10-CM

## 2021-07-09 DIAGNOSIS — E7800 Pure hypercholesterolemia, unspecified: Secondary | ICD-10-CM | POA: Diagnosis present

## 2021-07-09 DIAGNOSIS — L0390 Cellulitis, unspecified: Secondary | ICD-10-CM | POA: Diagnosis present

## 2021-07-09 DIAGNOSIS — I270 Primary pulmonary hypertension: Secondary | ICD-10-CM | POA: Diagnosis present

## 2021-07-09 DIAGNOSIS — I509 Heart failure, unspecified: Secondary | ICD-10-CM

## 2021-07-09 DIAGNOSIS — G4733 Obstructive sleep apnea (adult) (pediatric): Secondary | ICD-10-CM | POA: Diagnosis present

## 2021-07-09 DIAGNOSIS — Z955 Presence of coronary angioplasty implant and graft: Secondary | ICD-10-CM | POA: Diagnosis not present

## 2021-07-09 DIAGNOSIS — N179 Acute kidney failure, unspecified: Secondary | ICD-10-CM | POA: Diagnosis present

## 2021-07-09 DIAGNOSIS — F17210 Nicotine dependence, cigarettes, uncomplicated: Secondary | ICD-10-CM | POA: Diagnosis present

## 2021-07-09 DIAGNOSIS — Z7901 Long term (current) use of anticoagulants: Secondary | ICD-10-CM

## 2021-07-09 DIAGNOSIS — F17290 Nicotine dependence, other tobacco product, uncomplicated: Secondary | ICD-10-CM | POA: Diagnosis present

## 2021-07-09 DIAGNOSIS — Z823 Family history of stroke: Secondary | ICD-10-CM

## 2021-07-09 DIAGNOSIS — K219 Gastro-esophageal reflux disease without esophagitis: Secondary | ICD-10-CM | POA: Diagnosis present

## 2021-07-09 DIAGNOSIS — L03115 Cellulitis of right lower limb: Principal | ICD-10-CM | POA: Diagnosis present

## 2021-07-09 DIAGNOSIS — M79604 Pain in right leg: Secondary | ICD-10-CM | POA: Diagnosis not present

## 2021-07-09 DIAGNOSIS — E854 Organ-limited amyloidosis: Secondary | ICD-10-CM | POA: Diagnosis present

## 2021-07-09 DIAGNOSIS — Z86711 Personal history of pulmonary embolism: Secondary | ICD-10-CM

## 2021-07-09 DIAGNOSIS — Z833 Family history of diabetes mellitus: Secondary | ICD-10-CM

## 2021-07-09 DIAGNOSIS — E119 Type 2 diabetes mellitus without complications: Secondary | ICD-10-CM | POA: Diagnosis not present

## 2021-07-09 DIAGNOSIS — Z981 Arthrodesis status: Secondary | ICD-10-CM

## 2021-07-09 DIAGNOSIS — I43 Cardiomyopathy in diseases classified elsewhere: Secondary | ICD-10-CM | POA: Diagnosis present

## 2021-07-09 LAB — I-STAT CHEM 8, ED
BUN: 21 mg/dL (ref 8–23)
Calcium, Ion: 1.17 mmol/L (ref 1.15–1.40)
Chloride: 105 mmol/L (ref 98–111)
Creatinine, Ser: 1.4 mg/dL — ABNORMAL HIGH (ref 0.61–1.24)
Glucose, Bld: 137 mg/dL — ABNORMAL HIGH (ref 70–99)
HCT: 37 % — ABNORMAL LOW (ref 39.0–52.0)
Hemoglobin: 12.6 g/dL — ABNORMAL LOW (ref 13.0–17.0)
Potassium: 3.9 mmol/L (ref 3.5–5.1)
Sodium: 140 mmol/L (ref 135–145)
TCO2: 25 mmol/L (ref 22–32)

## 2021-07-09 LAB — BASIC METABOLIC PANEL
Anion gap: 8 (ref 5–15)
BUN: 20 mg/dL (ref 8–23)
CO2: 22 mmol/L (ref 22–32)
Calcium: 8.8 mg/dL — ABNORMAL LOW (ref 8.9–10.3)
Chloride: 108 mmol/L (ref 98–111)
Creatinine, Ser: 1.43 mg/dL — ABNORMAL HIGH (ref 0.61–1.24)
GFR, Estimated: 55 mL/min — ABNORMAL LOW (ref >60)
Glucose, Bld: 138 mg/dL — ABNORMAL HIGH (ref 70–99)
Potassium: 3.9 mmol/L (ref 3.5–5.1)
Sodium: 138 mmol/L (ref 135–145)

## 2021-07-09 LAB — CBC WITH DIFFERENTIAL/PLATELET
Abs Immature Granulocytes: 0.05 10*3/uL (ref 0.00–0.07)
Basophils Absolute: 0 10*3/uL (ref 0.0–0.1)
Basophils Relative: 0 %
Eosinophils Absolute: 0.1 10*3/uL (ref 0.0–0.5)
Eosinophils Relative: 1 %
HCT: 36 % — ABNORMAL LOW (ref 39.0–52.0)
Hemoglobin: 12.1 g/dL — ABNORMAL LOW (ref 13.0–17.0)
Immature Granulocytes: 1 %
Lymphocytes Relative: 19 %
Lymphs Abs: 1.9 10*3/uL (ref 0.7–4.0)
MCH: 30.5 pg (ref 26.0–34.0)
MCHC: 33.6 g/dL (ref 30.0–36.0)
MCV: 90.7 fL (ref 80.0–100.0)
Monocytes Absolute: 0.8 10*3/uL (ref 0.1–1.0)
Monocytes Relative: 8 %
Neutro Abs: 7.1 10*3/uL (ref 1.7–7.7)
Neutrophils Relative %: 71 %
Platelets: 251 10*3/uL (ref 150–400)
RBC: 3.97 MIL/uL — ABNORMAL LOW (ref 4.22–5.81)
RDW: 16.1 % — ABNORMAL HIGH (ref 11.5–15.5)
WBC: 10 10*3/uL (ref 4.0–10.5)
nRBC: 0 % (ref 0.0–0.2)

## 2021-07-09 LAB — CBG MONITORING, ED: Glucose-Capillary: 136 mg/dL — ABNORMAL HIGH (ref 70–99)

## 2021-07-09 LAB — GLUCOSE, CAPILLARY
Glucose-Capillary: 154 mg/dL — ABNORMAL HIGH (ref 70–99)
Glucose-Capillary: 119 mg/dL — ABNORMAL HIGH (ref 70–99)

## 2021-07-09 LAB — LACTIC ACID, PLASMA
Lactic Acid, Venous: 1.3 mmol/L (ref 0.5–1.9)
Lactic Acid, Venous: 1.3 mmol/L (ref 0.5–1.9)

## 2021-07-09 MED ORDER — PANTOPRAZOLE SODIUM 40 MG PO TBEC
40.0000 mg | DELAYED_RELEASE_TABLET | Freq: Every day | ORAL | Status: DC
  Administered 2021-07-10 – 2021-07-13 (×4): 40 mg via ORAL
  Filled 2021-07-09 (×4): qty 1

## 2021-07-09 MED ORDER — VANCOMYCIN HCL 1750 MG/350ML IV SOLN
1750.0000 mg | INTRAVENOUS | Status: DC
  Administered 2021-07-10 – 2021-07-11 (×2): 1750 mg via INTRAVENOUS
  Filled 2021-07-09 (×2): qty 350

## 2021-07-09 MED ORDER — VALACYCLOVIR HCL 500 MG PO TABS
500.0000 mg | ORAL_TABLET | Freq: Every day | ORAL | Status: DC
  Administered 2021-07-10 – 2021-07-13 (×4): 500 mg via ORAL
  Filled 2021-07-09 (×4): qty 1

## 2021-07-09 MED ORDER — ATORVASTATIN CALCIUM 80 MG PO TABS
80.0000 mg | ORAL_TABLET | Freq: Every day | ORAL | Status: DC
Start: 2021-07-10 — End: 2021-07-13
  Administered 2021-07-10 – 2021-07-13 (×4): 80 mg via ORAL
  Filled 2021-07-09 (×4): qty 1

## 2021-07-09 MED ORDER — ALBUTEROL SULFATE (2.5 MG/3ML) 0.083% IN NEBU: 2.5000 mg | INHALATION_SOLUTION | Freq: Four times a day (QID) | RESPIRATORY_TRACT | Status: DC | PRN

## 2021-07-09 MED ORDER — TAFAMIDIS 61 MG PO CAPS
61.0000 mg | ORAL_CAPSULE | Freq: Every day | ORAL | Status: DC
  Administered 2021-07-10 – 2021-07-12 (×3): 61 mg via ORAL
  Filled 2021-07-09 (×4): qty 1

## 2021-07-09 MED ORDER — SODIUM CHLORIDE 0.9 % IV SOLN
2.0000 g | Freq: Once | INTRAVENOUS | Status: AC
  Administered 2021-07-09: 2 g via INTRAVENOUS
  Filled 2021-07-09: qty 20

## 2021-07-09 MED ORDER — FLUTICASONE PROPIONATE 50 MCG/ACT NA SUSP
1.0000 | Freq: Every day | NASAL | Status: DC
  Administered 2021-07-10 – 2021-07-11 (×2): 1 via NASAL
  Filled 2021-07-09: qty 16

## 2021-07-09 MED ORDER — DAPAGLIFLOZIN PROPANEDIOL 10 MG PO TABS
10.0000 mg | ORAL_TABLET | Freq: Every day | ORAL | Status: DC
  Administered 2021-07-10 – 2021-07-13 (×4): 10 mg via ORAL
  Filled 2021-07-09 (×4): qty 1

## 2021-07-09 MED ORDER — PREGABALIN 100 MG PO CAPS
100.0000 mg | ORAL_CAPSULE | Freq: Three times a day (TID) | ORAL | Status: DC
  Administered 2021-07-09 – 2021-07-13 (×12): 100 mg via ORAL
  Filled 2021-07-09 (×12): qty 1

## 2021-07-09 MED ORDER — INSULIN ASPART 100 UNIT/ML IJ SOLN
0.0000 [IU] | Freq: Three times a day (TID) | INTRAMUSCULAR | Status: DC
  Administered 2021-07-09: 2 [IU] via SUBCUTANEOUS
  Administered 2021-07-10 – 2021-07-13 (×6): 1 [IU] via SUBCUTANEOUS

## 2021-07-09 MED ORDER — VANCOMYCIN HCL 10 G IV SOLR
2500.0000 mg | Freq: Once | INTRAVENOUS | Status: AC
  Administered 2021-07-09: 2500 mg via INTRAVENOUS
  Filled 2021-07-09: qty 2500

## 2021-07-09 MED ORDER — UMECLIDINIUM-VILANTEROL 62.5-25 MCG/ACT IN AEPB: 1.0000 | INHALATION_SPRAY | Freq: Every day | RESPIRATORY_TRACT | Status: DC | PRN

## 2021-07-09 MED ORDER — NICOTINE 21 MG/24HR TD PT24
21.0000 mg | MEDICATED_PATCH | Freq: Every day | TRANSDERMAL | Status: DC
  Administered 2021-07-09 – 2021-07-13 (×5): 21 mg via TRANSDERMAL
  Filled 2021-07-09 (×5): qty 1

## 2021-07-09 MED ORDER — ISOSORBIDE MONONITRATE ER 30 MG PO TB24
30.0000 mg | ORAL_TABLET | Freq: Every day | ORAL | Status: DC
  Administered 2021-07-10 – 2021-07-13 (×4): 30 mg via ORAL
  Filled 2021-07-09 (×4): qty 1

## 2021-07-09 MED ORDER — APIXABAN 2.5 MG PO TABS
2.5000 mg | ORAL_TABLET | Freq: Two times a day (BID) | ORAL | Status: DC
  Administered 2021-07-09 – 2021-07-13 (×8): 2.5 mg via ORAL
  Filled 2021-07-09 (×8): qty 1

## 2021-07-09 NOTE — Progress Notes (Signed)
Pharmacy Antibiotic Note  Russell Collier is a 61 y.o. male admitted on 07/09/2021 with cellulitis.  Pharmacy has been consulted for vancomycin dosing.  Patient has been on Keflex PTA and has completed all but 1 dose of course. He presents with worsening cellulitis.    WBC 10, Src 1.4, LA 1.3  Plan: Vancomycin 2500 mg x1 then 1750 mg q24h (eAUC 488) Ceftriaxone 2g daily per MD Follow-up cultures, LOT, clinical course and ability to de-escalate   Height: '5\' 10"'$  (177.8 cm) Weight: (!) 141.5 kg (312 lb) IBW/kg (Calculated) : 73  Temp (24hrs), Avg:98.4 F (36.9 C), Min:98.4 F (36.9 C), Max:98.4 F (36.9 C)  Recent Labs  Lab 07/09/21 1424 07/09/21 1433  WBC 10.0  --   CREATININE 1.43* 1.40*    Estimated Creatinine Clearance: 76.7 mL/min (A) (by C-G formula based on SCr of 1.4 mg/dL (H)).    Allergies  Allergen Reactions   Varenicline Anaphylaxis, Swelling and Other (See Comments)    Patient reports laryngospasm stopped in the ER, throat swelling   Bupropion Other (See Comments)    Headache - moderate/severe - self discontinued agent    Nicoderm [Nicotine] Rash    Antimicrobials this admission: 5/18 vancomycin >>  5/18 ceftriaxone >>   Dose adjustments this admission: N/A  Microbiology results: 5/18 BCx: sent  --- Prior admission: 5/8 MRSA PCR neg 5/7 Bcx NGF 5/7 Ucx Enterobacter cloacae > 100k - panS except ancef   Thank you for allowing pharmacy to be a part of this patient's care.  Anderson Malta A Lorma Heater 07/09/2021 3:52 PM

## 2021-07-09 NOTE — Telephone Encounter (Signed)
Called patient to let him know his brace is in and that he needs to schedule an appointment. Patient is in the emergency department at this time and will call us back after discharge

## 2021-07-09 NOTE — ED Triage Notes (Signed)
Pt BIB [facility] from home for cellulites on his right lower leg, that he has had for 2 weeks. He has been on several antibiotics and antibodies. Pt states that it is spreading and the pain is worse and skin is starting to breakdown on the back of his leg.   BP 120 palp HR 80 95% RA CBG 153

## 2021-07-09 NOTE — Progress Notes (Signed)
FPTS Brief Progress Note  S:Patient awake when I entered the room. Feeling well, denies any lower extremity pain.    O: BP 116/64 (BP Location: Right Arm)   Pulse 62   Temp 97.8 F (36.6 C) (Oral)   Resp 16   Ht '5\' 10"'$  (1.778 m)   Wt (!) 141.5 kg   SpO2 100%   BMI 44.77 kg/m   Physical exam General: well appearing, NAD Cardiovascular: RRR, no murmurs Lungs: Normal WOB on RA Abdomen: soft, non-distended, non-tender Skin: RLE erythema, edema and warmth under area of demarcation  A/P: RLE cellulitis  Vitals stable, remains afebrile  - continue Vanc  - f/u blood cultures  - am BMP, CBC  - Orders reviewed. Labs for AM ordered, which was adjusted as needed.   Remainder of plan per day team's note  Shary Key, DO 07/09/2021, 10:32 PM PGY-2, [facility] Night Resident  Please page [phone] with questions.
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
## 2021-07-09 NOTE — ED Provider Notes (Signed)
[facility] EMERGENCY DEPARTMENT Provider Note   CSN: [id number] Arrival date & time: 07/09/21  1354     History  Chief Complaint  Patient presents with   Cellulitis    Russell Collier is a 61 y.o. male.  61 yo M with a chief complaint of right leg pain and swelling.  This has been an ongoing issue for him.  He has been treated as cellulitis but unfortunately has had persistent and reportedly worsening symptoms.  As recently as a couple days ago is still having 103 fevers and was unable to do physical therapy.  With worsening today decided to come back to the emergency department for evaluation.  He was admitted about a week ago and was discharged with Keflex.  Has taken all but 1 dose.           Home Medications Prior to Admission medications   Medication Sig Start Date End Date Taking? Authorizing Provider  Accu-Chek Softclix Lancets lancets Use as instructed 11/20/20   [name], MD  albuterol (PROVENTIL) (2.5 MG/3ML) 0.083% nebulizer solution Take 3 mLs (2.5 mg total) by nebulization every 6 (six) hours as needed for wheezing or shortness of breath. 11/20/20   [name], MD  albuterol (VENTOLIN HFA) 108 (90 Base) MCG/ACT inhaler Inhale 2 puffs into the lungs every 6 (six) hours as needed for wheezing or shortness of breath. 11/20/20   [name], MD  ANORO ELLIPTA 62.5-25 MCG/ACT AEPB INHALE 1 PUFF INTO THE LUNGS DAILY AS NEEDED. Patient taking differently: Inhale 1 puff into the lungs daily as needed (for "flares"). 05/19/21   [name], MD  apixaban (ELIQUIS) 2.5 MG TABS tablet Take 1 tablet (2.5 mg total) by mouth 2 (two) times daily. 11/20/20   [name], MD  atorvastatin (LIPITOR) 80 MG tablet Take 1 tablet (80 mg total) by mouth daily. 11/20/20   [name], MD  Blood Glucose Monitoring Suppl (ACCU-CHEK GUIDE) w/Device KIT 1 Units by Does not apply route daily. 11/20/20   [name], MD  cephALEXin (KEFLEX) 500 MG  capsule Take 1 capsule (500 mg total) by mouth 3 (three) times daily for 7 days. 07/03/21 07/10/21  [name], MD  enalapril (VASOTEC) 20 MG tablet Take 1 tablet (20 mg total) by mouth at bedtime. 11/20/20   [name], MD  FARXIGA 10 MG TABS tablet TAKE 1 TABLET (10 MG TOTAL) BY MOUTH DAILY BEFORE BREAKFAST. Patient taking differently: Take 10 mg by mouth daily before breakfast. 06/16/21   [name], MD  fluticasone [facility]) 50 MCG/ACT nasal spray Place 1 spray into both nostrils daily. Patient taking differently: Place 1 spray into both nostrils daily as needed for allergies. 11/20/20   [name], MD  furosemide (LASIX) 40 MG tablet Take 1.5 tablets (60 mg total) by mouth 2 (two) times daily. 05/21/21   Lyda Jester M, PA-C  glucose blood (ACCU-CHEK GUIDE) test strip Use as instructed 11/20/20   [name], MD  ibuprofen (ADVIL) 200 MG tablet Take 800 mg by mouth every 6 (six) hours as needed for headache or mild pain.    [provider]  isosorbide mononitrate (IMDUR) 30 MG 24 hr tablet Take 1 tablet (30 mg total) by mouth daily. 11/20/20   [name], MD  multivitamin (ONE-A-DAY MEN'S) TABS tablet Take 1 tablet by mouth daily with breakfast.    [provider]  pantoprazole (PROTONIX) 40 MG tablet TAKE 1 TABLET (40 MG TOTAL) BY MOUTH DAILY. Patient taking differently: Take 40  mg by mouth daily before breakfast. 05/19/21   [name], MD  potassium chloride SA (KLOR-CON M) 20 MEQ tablet Take 2 tablets (40 mEq total) by mouth daily. Patient taking differently: Take 60 mEq by mouth daily. 05/21/21   Lyda Jester M, PA-C  pregabalin (LYRICA) 100 MG capsule TAKE ONE CAPSULE BY MOUTH THREE TIMES DAILY Patient taking differently: Take 100 mg by mouth 3 (three) times daily. 04/23/21   [name], MD  Tafamidis (VYNDAMAX) 61 MG CAPS TAKE 1 CAPSULE BY MOUTH DAILY. Patient taking differently: Take 61 mg by mouth daily. 12/01/20 12/01/21   [name], [name], MD  triamcinolone ointment (KENALOG) 0.1 % Apply 1 application topically 2 (two) times daily. Patient taking differently: Apply 1 application. topically 2 (two) times daily as needed (for itching). 03/12/21   Alcus Dad, MD  valACYclovir (VALTREX) 500 MG tablet Take 1 tablet (500 mg total) by mouth daily. 11/20/20   [name], MD  Vitamin A 2400 MCG (8000 UT) TABS Take 1 tablet by mouth daily. Patient taking differently: Take 8,000 Units by mouth daily. 05/21/21   [name], [name], MD  vutrisiran sodium (AMVUTTRA) 25 MG/0.5ML syringe Inject 0.5 mLs (25 mg total) into the skin every 3 (three) months. 05/01/21   [name], [name], MD      Allergies    Varenicline, Bupropion, and Nicoderm [nicotine]    Review of Systems   Review of Systems  Physical Exam Updated Vital Signs BP (!) 101/56   Pulse 78   Temp 98.4 F (36.9 C) (Oral)   Ht 5' 10" (1.778 m)   Wt (!) 141.5 kg   SpO2 96%   BMI 44.77 kg/m  Physical Exam Vitals and nursing note reviewed.  Constitutional:      Appearance: He is well-developed.  HENT:     Head: Normocephalic and atraumatic.  Eyes:     Pupils: Pupils are equal, round, and reactive to light.  Neck:     Vascular: No JVD.  Cardiovascular:     Rate and Rhythm: Normal rate and regular rhythm.     Heart sounds: No murmur heard.   No friction rub. No gallop.  Pulmonary:     Effort: No respiratory distress.     Breath sounds: No wheezing.  Abdominal:     General: There is no distension.     Tenderness: There is no abdominal tenderness. There is no guarding or rebound.  Musculoskeletal:        General: Normal range of motion.     Cervical back: Normal range of motion and neck supple.  Skin:    Coloration: Skin is not pale.     Findings: No rash.  Neurological:     Mental Status: He is alert and oriented to person, place, and time.  Psychiatric:        Behavior: Behavior normal.    ED Results / Procedures / Treatments    Labs (all labs ordered are listed, but only abnormal results are displayed) Labs Reviewed  I-STAT CHEM 8, ED - Abnormal; Notable for the following components:      Result Value   Creatinine, Ser 1.40 (*)    Glucose, Bld 137 (*)    Hemoglobin 12.6 (*)    HCT 37.0 (*)    All other components within normal limits  CULTURE, BLOOD (ROUTINE X 2)  CULTURE, BLOOD (ROUTINE X 2)  CBC WITH DIFFERENTIAL/PLATELET  BASIC METABOLIC PANEL  LACTIC ACID, PLASMA  LACTIC ACID, PLASMA  EKG None  Radiology No results found.  Procedures Procedures    Medications Ordered in ED Medications  cefTRIAXone (ROCEPHIN) 2 g in sodium chloride 0.9 % 100 mL IVPB (2 g Intravenous New Bag/Given 07/09/21 1434)    ED Course/ Medical Decision Making/ A&P                           Medical Decision Making Amount and/or Complexity of Data Reviewed Labs: ordered.   61 yo M with a chief complaints of right leg pain and swelling.  This has been an ongoing issue for him.  Been treated as cellulitis.  Clinically it does appear to be cellulitic.  Will start on IV antibiotics as it appears to have failed outpatient therapy.  Will discuss with family medicine for possible admission.  Repeat blood work blood cultures lactate.  I sent Chem-8 with renal function at baseline.  No significant change to baseline hemoglobin.  The patients results and plan were reviewed and discussed.   Any x-rays performed were independently reviewed by myself.   Differential diagnosis were considered with the presenting HPI.  Medications  cefTRIAXone (ROCEPHIN) 2 g in sodium chloride 0.9 % 100 mL IVPB (2 g Intravenous New Bag/Given 07/09/21 1434)    Vitals:   07/09/21 1358 07/09/21 1359 07/09/21 1400 07/09/21 1423  BP: (!) 105/58  (!) 101/56   Pulse: 80  78   Temp:    98.4 F (36.9 C)  TempSrc:    Oral  SpO2: 95%  96%   Weight:  (!) 141.5 kg    Height:  5' 10" (1.778 m)      Final diagnoses:  Cellulitis of right lower  extremity    Admission/ observation were discussed with the admitting physician, patient and/or family and they are comfortable with the plan.          Final Clinical Impression(s) / ED Diagnoses Final diagnoses:  Cellulitis of right lower extremity    Rx / DC Orders ED Discharge Orders     None         Deno Etienne, DO 07/09/21 1445

## 2021-07-09 NOTE — Plan of Care (Signed)

## 2021-07-10 ENCOUNTER — Ambulatory Visit: Payer: Medicaid Other | Admitting: Family Medicine

## 2021-07-10 DIAGNOSIS — L0390 Cellulitis, unspecified: Secondary | ICD-10-CM | POA: Diagnosis not present

## 2021-07-10 DIAGNOSIS — L03115 Cellulitis of right lower limb: Secondary | ICD-10-CM

## 2021-07-10 DIAGNOSIS — M5136 Other intervertebral disc degeneration, lumbar region: Secondary | ICD-10-CM | POA: Diagnosis not present

## 2021-07-10 LAB — CBC
HCT: 35.3 % — ABNORMAL LOW (ref 39.0–52.0)
Hemoglobin: 11.9 g/dL — ABNORMAL LOW (ref 13.0–17.0)
MCH: 30.2 pg (ref 26.0–34.0)
MCHC: 33.7 g/dL (ref 30.0–36.0)
MCV: 89.6 fL (ref 80.0–100.0)
Platelets: 244 10*3/uL (ref 150–400)
RBC: 3.94 MIL/uL — ABNORMAL LOW (ref 4.22–5.81)
RDW: 16.3 % — ABNORMAL HIGH (ref 11.5–15.5)
WBC: 9.1 10*3/uL (ref 4.0–10.5)
nRBC: 0 % (ref 0.0–0.2)

## 2021-07-10 LAB — BASIC METABOLIC PANEL
Anion gap: 7 (ref 5–15)
BUN: 19 mg/dL (ref 8–23)
CO2: 24 mmol/L (ref 22–32)
Calcium: 8.6 mg/dL — ABNORMAL LOW (ref 8.9–10.3)
Chloride: 107 mmol/L (ref 98–111)
Creatinine, Ser: 1.33 mg/dL — ABNORMAL HIGH (ref 0.61–1.24)
GFR, Estimated: >60 mL/min (ref >60)
Glucose, Bld: 158 mg/dL — ABNORMAL HIGH (ref 70–99)
Potassium: 3.7 mmol/L (ref 3.5–5.1)
Sodium: 138 mmol/L (ref 135–145)

## 2021-07-10 LAB — GLUCOSE, CAPILLARY
Glucose-Capillary: 121 mg/dL — ABNORMAL HIGH (ref 70–99)
Glucose-Capillary: 137 mg/dL — ABNORMAL HIGH (ref 70–99)
Glucose-Capillary: 127 mg/dL — ABNORMAL HIGH (ref 70–99)
Glucose-Capillary: 121 mg/dL — ABNORMAL HIGH (ref 70–99)

## 2021-07-10 MED ORDER — ACETAMINOPHEN 325 MG PO TABS
650.0000 mg | ORAL_TABLET | Freq: Four times a day (QID) | ORAL | Status: DC | PRN
  Administered 2021-07-10 – 2021-07-12 (×3): 650 mg via ORAL
  Filled 2021-07-10 (×3): qty 2

## 2021-07-10 MED ORDER — CAMPHOR-MENTHOL 0.5-0.5 % EX LOTN: TOPICAL_LOTION | CUTANEOUS | Status: DC | PRN

## 2021-07-10 MED ORDER — FUROSEMIDE 40 MG PO TABS
60.0000 mg | ORAL_TABLET | Freq: Two times a day (BID) | ORAL | Status: DC
  Administered 2021-07-10 – 2021-07-13 (×7): 60 mg via ORAL
  Filled 2021-07-10 (×7): qty 1

## 2021-07-10 NOTE — Hospital Course (Addendum)
Russell Collier is a 61 year old male presenting with worsening right lower extremity cellulitis.  Past medical history significant for Enterobacter urosepsis, familial cardiac amyloidosis, PE, T2DM, COPD, and OSA.  RLE cellulitis Ongoing since admission 5/7; failed Keflex 500 mg 3 times daily x7 days.  Since patient failed MSSA coverage, treated for MRSA this admission with IV Vancomycin 2.5g x1, then 1.75g x 2 to which he responded well.  Patient remained afebrile, with WBC WNL, lactic acid 1.3, and without subjective symptoms of infection throughout admission. Blood cultures with NGTD. DVT ultrasound was negative. Patient switched to PO Doxycycline on 5/21. Pt should continue this for total duration of 4 days.   Tobacco use disorder Patient reports smoking 20 cigars per day. Cravings somewhat controlled with nicotine patch 21 mg daily. Tobacco cessation counseling provided and patient to be prescribed nicotine patches on discharge.  HTN Patient with soft BP throughout admission. SBP 100s-110s. Imdur 30 mg resumed on day of admission, and Lasix restarted on day 2 of admission to prevent HF exacerbation. Enalapril initially held but then continued day before discharge.  Chronic, stable conditions managed with home meds: Hx of PE Familial cardiac amyloidosis HFmrEF CAD COPD OSA HLD T2DM HSV, dormant  Follow-Up Concerns: Repeat BMP to ensure K+ in goal range and continued resolution of AKI Cellulitis: With great resolve of erythema and warmth while in hospital. Assess edema and pain. Continue Doxycycline for 2 more days after discharge (Start date 5/21; End date: 5/25)  Advised against NSAID use as pt is on Eliquis.

## 2021-07-10 NOTE — Progress Notes (Signed)
Mobility Specialist Progress Note   07/10/21 1136  Mobility  Activity Ambulated independently in hallway  Level of Assistance Modified independent, requires aide device or extra time  Assistive Device [facility] Ambulated (ft) 620 ft  Activity Response Tolerated well  $Mobility charge 1 Mobility   Received pt in doorway having no complaints and agreeable to mobility. Asymptomatic throughout ambulation w/ x1 seated break for leisure. Returned back to chair w/ call bell in reach and all needs met.  [name] Falling Mobility Specialist Phone Number [phone]

## 2021-07-10 NOTE — Progress Notes (Signed)
FPTS Brief Progress Note  S: Patient sleeping with lights on    O: BP 124/70 (BP Location: Left Arm)   Pulse 73   Temp 98 F (36.7 C) (Oral)   Resp 18   Ht '5\' 10"'$  (1.778 m)   Wt (!) 141.5 kg   SpO2 93%   BMI 44.77 kg/m     A/P:  RLE cellulitis  - Continue Vanc, will switch to po tomorrow  - f/u blood cultures  - Tylenol prn for pain  - am cbc - PT/OT  - Orders reviewed. Labs for AM ordered, which was adjusted as needed.   Remainder of plan per day team note   Shary Key, DO 07/10/2021, 10:08 PM PGY-2, [place] Medicine Night Resident  Please page [phone] with questions.

## 2021-07-10 NOTE — Evaluation (Signed)
Physical Therapy Evaluation Patient Details Name: Russell Collier MRN: [id number] DOB: 10/01/1960 Today's Date: 07/10/2021  History of Present Illness  61 y.o. male was admitted 5/18 for atypical chest pain and noted worsening of RLE cellulitis.  Pt in CHF, but resolved his chest pain.  Found no other acute issues, cleared for susp PE and worked on management of infection on RLE with ABT.  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  Clinical Impression  Pt was seen for mobility on [facility] with observation of stability on RLE.  Pt is fairly good with cane on level ground, but is a bit less steady when walking on stairs.  RLE is mildly unstable, and with SPC and railing with careful sequence is safe.  Required a few reminders about the placement of cane, but if relying only on handrails should be fine.  Follow along for his mobility training, and encourage him to walk frequently on the hallway with staff and SPC.  HHPT is requested for follow up to continue to strengthen and work on balance and power up on stairs.  Acute PT goals are outlined in POC below.       Recommendations for follow up therapy are one component of a multi-disciplinary discharge planning process, led by the attending physician.  Recommendations may be updated based on patient status, additional functional criteria and insurance authorization.  Follow Up Recommendations Home health PT    Assistance Recommended at Discharge Set up Supervision/Assistance  Patient can return home with the following  A little help with bathing/dressing/bathroom;Assistance with cooking/housework;Assist for transportation;Help with stairs or ramp for entrance    Equipment Recommendations None recommended by PT  Recommendations for Other Services       Functional Status Assessment Patient has had a recent decline in their functional status and  demonstrates the ability to make significant improvements in function in a reasonable and predictable amount of time.     Precautions / Restrictions Precautions Precautions: Fall Precaution Comments: unstable on RLE Restrictions Weight Bearing Restrictions: No      Mobility  Bed Mobility               General bed mobility comments: up in chair when PT arrived    Transfers Overall transfer level: Needs assistance Equipment used: None Transfers: Sit to/from Stand Sit to Stand: Modified independent (Device/Increase time)           General transfer comment: extra time to stand    Ambulation/Gait Ambulation/Gait assistance: Supervision, Min guard Gait Distance (Feet): 140 Feet Assistive device: 1 person hand held assist, Straight cane Gait Pattern/deviations: Step-through pattern, Decreased stride length, Wide base of support Gait velocity: reduced Gait velocity interpretation: <1.31 ft/sec, indicative of household ambulator Pre-gait activities: standing balance ck General Gait Details: pt controls wgt shift onto RLE, using SPC and with lateral shift to rely on LLE  Stairs Stairs: Yes Stairs assistance: Min guard, Min assist Stair Management: One rail Right, One rail Left, Forwards, Alternating pattern Number of Stairs: 14 General stair comments: requires cues for seuqencing the SPC to avoid giving out on RLE  Wheelchair Mobility    Modified Rankin (Stroke Patients Only)       Balance Overall balance assessment: Needs assistance Sitting-balance support: Feet supported Sitting balance-Leahy Scale: Good     Standing balance support: Single extremity supported Standing balance-Leahy Scale: Fair  Pertinent Vitals/Pain Pain Assessment Pain Assessment: Faces Faces Pain Scale: Hurts a little bit Pain Location: RLE Pain Descriptors / Indicators: Guarding Pain Intervention(s): Limited activity within patient's  tolerance, Monitored during session, Premedicated before session, Repositioned    Home Living Family/patient expects to be discharged to:: Private residence Living Arrangements: Other relatives Available Help at Discharge: Family;Available 24 hours/day Type of Home: Apartment Home Access: Stairs to enter Entrance Stairs-Rails: Can reach both Entrance Stairs-Number of Steps: 21 (14 + 7)   Home Layout: Two level Home Equipment: Cane - single point Additional Comments: only has a cane as he was so independent previously    Prior Function Prior Level of Function : Independent/Modified Independent;Working/employed (working as a Dispensing optician)             Mobility Comments: SPC needed since his RLE has been infected with cellulitis       Hand Dominance   Dominant Hand: Right    Extremity/Trunk Assessment   Upper Extremity Assessment Upper Extremity Assessment: Overall WFL for tasks assessed    Lower Extremity Assessment Lower Extremity Assessment: RLE deficits/detail RLE Deficits / Details: edema with weakness of ankle from cellulitis RLE Coordination: decreased gross motor    Cervical / Trunk Assessment Cervical / Trunk Assessment: Normal  Communication   Communication: No difficulties  Cognition Arousal/Alertness: Awake/alert Behavior During Therapy: WFL for tasks assessed/performed Overall Cognitive Status: Within Functional Limits for tasks assessed                                 General Comments: pt is fairly aware of his limitations        General Comments General comments (skin integrity, edema, etc.): pt demonstrates a limited tolerance for stairs but is fairly independent for gait on SPC with paced effort    Exercises General Exercises - Lower Extremity Ankle Circles/Pumps: AROM, 5 reps Quad Sets: AROM, 10 reps Gluteal Sets: AROM, 10 reps Other Exercises Other Exercises: IR stretches to manage R hip ER tightness   Assessment/Plan    PT  Assessment Patient needs continued PT services  PT Problem List Decreased strength;Decreased range of motion;Decreased activity tolerance;Decreased balance;Decreased mobility;Decreased coordination;Decreased skin integrity;Pain       PT Treatment Interventions Gait training;Stair training;Functional mobility training;Therapeutic activities;DME instruction;Therapeutic exercise;Balance training;Neuromuscular re-education;Patient/family education    PT Goals (Current goals can be found in the Care Plan section)  Acute Rehab PT Goals Patient Stated Goal: to walk and get home PT Goal Formulation: With patient Time For Goal Achievement: 07/17/21 Potential to Achieve Goals: Good    Frequency Min 3X/week     Co-evaluation               AM-PAC PT "6 Clicks" Mobility  Outcome Measure Help needed turning from your back to your side while in a flat bed without using bedrails?: None Help needed moving from lying on your back to sitting on the side of a flat bed without using bedrails?: A Little Help needed moving to and from a bed to a chair (including a wheelchair)?: A Little Help needed standing up from a chair using your arms (e.g., wheelchair or bedside chair)?: A Little Help needed to walk in hospital room?: A Little Help needed climbing 3-5 steps with a railing? : A Little 6 Click Score: 19    End of Session Equipment Utilized During Treatment: Gait belt Activity Tolerance: Patient tolerated treatment well Patient left: in chair;with call  bell/phone within reach Nurse Communication: Mobility status PT Visit Diagnosis: Unsteadiness on feet (R26.81);Muscle weakness (generalized) (M62.81);Pain;Difficulty in walking, not elsewhere classified (R26.2) Pain - Right/Left: Right Pain - part of body: Leg    Time: 1339-1406 PT Time Calculation (min) (ACUTE ONLY): 27 min   Charges:   PT Evaluation $PT Eval Moderate Complexity: 1 Mod PT Treatments $Gait Training: 8-22 mins        Ramond Dial 07/10/2021, 4:47 PM  Mee Hives, PT PhD Acute Rehab Dept. Number: [place]

## 2021-07-10 NOTE — Discharge Instructions (Addendum)
Dear Russell Collier,  Thank you for letting us participate in your care. You were hospitalized for cellulitis.   POST-HOSPITAL & CARE INSTRUCTIONS STOP taking ibuprofen. This increases your bleeding risk while on Eliquis, a blood thinner.  CONTINUE taking your daily home potassium supplementation.  Follow up with your primary care doctor (PCP). Details below.  Go to your follow up appointments (listed below)   DOCTOR'S APPOINTMENT   Future Appointments  Date Time Provider [place]  07/13/2021  2:30 PM Marijo Sanes, PT [facility]  07/14/2021  1:40 PM [name], [name], MD [facility] None  07/15/2021 12:30 PM [facility]MM CARE MANAGER [facility] None  07/16/2021  2:15 PM Scheeler, Carola Rhine, PA-C [facility] None  07/17/2021 10:10 AM [name], MD [facility]  07/22/2021  2:15 PM Vedia Pereyra, PT [facility]  07/28/2021  9:45 AM Lorenda Peck, DPM [facility]  08/27/2021  1:00 PM [facility] PHARMACY [facility] None  10/30/2021  3:30 PM Alda Berthold, DO [facility] None    Follow-up Information     [name], MD. Go on 07/17/2021.   Specialty: Family Medicine Why: At 10:10 am. Please arrive by 9:55 am. This is your hospital follow up appointment with your primary care doctor, [name]. Contact information: [address] [place] [address] [phone]         [name], MD .   Specialty: Cardiology Contact information: [phone] [address] [place] [address] [phone]                 Take care and be well!  [facility]  [place], [place] [address] [phone]

## 2021-07-10 NOTE — Progress Notes (Signed)
Family Medicine Teaching Service Daily Progress Note Intern Pager: [phone]  Patient name: Russell Collier Medical record number: [id number] Date of birth: [birth year] Age: 61 y.o. Gender: male  Primary Care Provider: [name], MD Consultants: None Code Status: Full  Pt Overview and Major Events to Date:  5/18-admitted  Assessment and Plan:  Russell Collier is a 61 year old male presenting with worsening right lower extremity cellulitis.  Past medical history significant for Enterobacter urosepsis, familial cardiac amyloidosis, PE, T2DM, COPD, and OSA.  RLE cellulitis Ongoing since admission 5/7; failed Keflex 500 mg 3 times daily x7 days.  Since patient failed MSSA coverage, will treat for MRSA.  Patient remains afebrile, WBC 9.1, lactic acid 1.3, and without subjective symptoms today.  Low concern for DVT at this time, as patient is on Eliquis and recent imaging negative for DVT. -Continue vancomycin 1750 mg every 24 hours - Blood cultures x2 in process -Continue to monitor on daily CBC - PT/OT   Hx of PE DVT ruled out during last admission with RLE Doppler. - Continue home Eliquis 2.5 mg twice daily  Familial cardiac amyloidosis - Continue home tafamidis 61 mg daily  HTN Hx of HFmrEF Hx of CAD MR BP 118/73.  Echo in March with LVEF 50 to 55%. - Continue home Imdur 30 mg daily - Continue home Farxiga 10 mg daily - Continue to hold remainder of home antihypertensives due to soft BP.  Will restart as blood pressure increases  COPD OSA - Continue CPAP nightly - Continue home Anoro Ellipta 1 puff daily as needed for flares - Albuterol as needed  HLD - Continue home atorvastatin 80 mg daily  T2DM 24-hour CBG 119-136. - Continue home Farxiga 10 mg daily - SSI - CBG monitoring  GERD - Continue home Protonix 40 mg daily before breakfast  Tobacco use disorder - Continue nicotine patch 21 mg daily -Tobacco cessation counseling  HSV suppression -Continue home  valacyclovir 500 mg daily   FEN/GI: Carb modified PPx: Eliquis Dispo:Home in 2-3 days. Barriers include clinical improvement.   Subjective:  Patient seen ambulating throughout the hall.  He reports RLE pain, but no other concerns or complaints.  Objective: Temp:  [97.4 F (36.3 C)-98.4 F (36.9 C)] 98 F (36.7 C) (05/19 0740) Pulse Rate:  [61-80] 73 (05/19 0740) Resp:  [16-18] 18 (05/19 0740) BP: (93-118)/(51-73) 118/73 (05/19 0740) SpO2:  [95 %-100 %] 98 % (05/19 0740) Weight:  [141.5 kg] 141.5 kg (05/18 1359) Physical Exam: General: Well-appearing male ambulating halls with cane, in no acute distress Cardiovascular: RRR, no murmurs/rubs/gallops Respiratory: CTAB, no crackles, wheezes, or rales.  Normal WOB on room air at rest and with ambulation Abdomen: Soft, nontender, nondistended, normal bowel sounds in all 4 quadrants MSK: Normal ROM of all 4 extremities Derm: RLE with improvement to erythema, see pictures below.  Less warmth to touch.  Remains edematous with peeling skin. Neurological: Gait with limp and cane assistance, dragging RLE.  Otherwise no focal neurological deficits of CN II to XII.      Laboratory: Recent Labs  Lab 07/09/21 1424 07/09/21 1433 07/10/21 0222  WBC 10.0  --  9.1  HGB 12.1* 12.6* 11.9*  HCT 36.0* 37.0* 35.3*  PLT 251  --  244   Recent Labs  Lab 07/09/21 1424 07/09/21 1433 07/10/21 0222  NA 138 140 138  K 3.9 3.9 3.7  CL 108 105 107  CO2 22  --  24  BUN '20 21 19  '$ CREATININE 1.43* 1.40* 1.33*  CALCIUM 8.8*  --  8.6*  GLUCOSE 138* 137* 158*    Blood cultures pending x2 Lactic acid 1.3 x2  Imaging/Diagnostic Tests: No new imaging to review  Rosezetta Schlatter, MD 07/10/2021, 7:53 AM PGY-1, [place] Intern pager: [phone], text pages welcome

## 2021-07-10 NOTE — Progress Notes (Incomplete)
Family Medicine Teaching Service Daily Progress Note Intern Pager: [phone]  Patient name: Russell Collier Medical record number: [id number] Date of birth: [birth year] Age: 61 y.o. Gender: male  Primary Care Provider: [name], MD Consultants: None Code Status: Full   Pt Overview and Major Events to Date:  5/18-admitted  Assessment and Plan: Hollie Bartus is a 61 year old male presenting with worsening right lower extremity cellulitis.  Past medical history significant for Enterobacter urosepsis, familial cardiac amyloidosis, PE, T2DM, COPD, and OSA.  RLE cellulitis  Patient still with significant erythema and edema of RLE maybe minimally improved after being on Vancomycin for 2 days. Vitals stable, remains afebrile. WBC 8.8. Blood cultures NGTD - Consider switching from Vanc to po Bactrim or Doxy given he did not improve on Keflex prior to admission  - f/u blood cultures  - Tylenol prn for pain  - am cbc - DVT ultrasound given continued erythema and edema with minimal improvement on abx  - wound care consult placed for assistance  - PT/OT  Hx PE  - Continue Eliquis 2.'6mg'$  BID  Familial cardiac amyloidosis - Continue tafamidis '61mg'$  daily   HTN Hx of HFmrEF Hx of CAD Echo in March with EF 50-55%. BP over last 24 hours have ranged from 113-124/27-73 - Continue Imdur '30mg'$  daily  - Farxiga '10mg'$  daily - lasix '60mg'$  BID - continue to monitor BP   COPD OSA - CPAP nightly - continue Anoro Ellipta 1 puff daily prn for flares - albuterol prn  HLD - Atorvastatin '80mg'$  daily  DM2 AM CBG 124 - continue [place] '10mg'$  daily - SSI - CBG monitoring  GERD - protonix '40mg'$  daily   Tobacco use disorder - nicotine patch '21mg'$  daily - tobacco cessation   HSV  - continue valacyclovir '500mg'$  daily   FEN/GI: carb modified   PPx: Eliquis  Disp: Home 1-2 days pending continued improvement on po abx    Subjective:  No acute events overnight. Patient states he is still having  some RLE pain and swelling. Feels the pain has not improved. He asks when the swelling should go down.   Objective: Temp:  [98 F (36.7 C)] 98 F (36.7 C) (05/19 2045) Pulse Rate:  [65-73] 73 (05/19 2045) Resp:  [18] 18 (05/19 1513) BP: (113-124)/(57-73) 124/70 (05/19 2045) SpO2:  [93 %-98 %] 93 % (05/19 2045) Physical Exam: General: alert laying in bed, NAD Cardiovascular: RRR Respiratory: CTAB normal WOB  Abdomen: soft, non distended, non tender Extremities: RLE with erythema , peeling and warmth to line of demarcation (see image below).       Laboratory: Recent Labs  Lab 07/09/21 1424 07/09/21 1433 07/10/21 0222  WBC 10.0  --  9.1  HGB 12.1* 12.6* 11.9*  HCT 36.0* 37.0* 35.3*  PLT 251  --  244   Recent Labs  Lab 07/09/21 1424 07/09/21 1433 07/10/21 0222  NA 138 140 138  K 3.9 3.9 3.7  CL 108 105 107  CO2 22  --  24  BUN '20 21 19  '$ CREATININE 1.43* 1.40* 1.33*  CALCIUM 8.8*  --  8.6*  GLUCOSE 138* 137* 158*    Imaging/Diagnostic Tests: None new  Shary Key, DO 07/10/2021, 9:48 PM PGY-2, [place] Intern pager: [phone], text pages welcome

## 2021-07-11 ENCOUNTER — Inpatient Hospital Stay (HOSPITAL_COMMUNITY): Payer: Medicaid Other

## 2021-07-11 DIAGNOSIS — E119 Type 2 diabetes mellitus without complications: Secondary | ICD-10-CM

## 2021-07-11 DIAGNOSIS — I270 Primary pulmonary hypertension: Secondary | ICD-10-CM | POA: Diagnosis not present

## 2021-07-11 DIAGNOSIS — M79604 Pain in right leg: Secondary | ICD-10-CM

## 2021-07-11 DIAGNOSIS — L03115 Cellulitis of right lower limb: Secondary | ICD-10-CM | POA: Diagnosis not present

## 2021-07-11 DIAGNOSIS — I509 Heart failure, unspecified: Secondary | ICD-10-CM | POA: Diagnosis not present

## 2021-07-11 LAB — BASIC METABOLIC PANEL
Anion gap: 8 (ref 5–15)
BUN: 24 mg/dL — ABNORMAL HIGH (ref 8–23)
CO2: 24 mmol/L (ref 22–32)
Calcium: 8.6 mg/dL — ABNORMAL LOW (ref 8.9–10.3)
Chloride: 104 mmol/L (ref 98–111)
Creatinine, Ser: 1.3 mg/dL — ABNORMAL HIGH (ref 0.61–1.24)
GFR, Estimated: >60 mL/min (ref >60)
Glucose, Bld: 124 mg/dL — ABNORMAL HIGH (ref 70–99)
Potassium: 3.5 mmol/L (ref 3.5–5.1)
Sodium: 136 mmol/L (ref 135–145)

## 2021-07-11 LAB — CBC
HCT: 37 % — ABNORMAL LOW (ref 39.0–52.0)
Hemoglobin: 12.6 g/dL — ABNORMAL LOW (ref 13.0–17.0)
MCH: 30.1 pg (ref 26.0–34.0)
MCHC: 34.1 g/dL (ref 30.0–36.0)
MCV: 88.5 fL (ref 80.0–100.0)
Platelets: 261 10*3/uL (ref 150–400)
RBC: 4.18 MIL/uL — ABNORMAL LOW (ref 4.22–5.81)
RDW: 15.9 % — ABNORMAL HIGH (ref 11.5–15.5)
WBC: 8.8 10*3/uL (ref 4.0–10.5)
nRBC: 0 % (ref 0.0–0.2)

## 2021-07-11 LAB — GLUCOSE, CAPILLARY
Glucose-Capillary: 122 mg/dL — ABNORMAL HIGH (ref 70–99)
Glucose-Capillary: 116 mg/dL — ABNORMAL HIGH (ref 70–99)
Glucose-Capillary: 123 mg/dL — ABNORMAL HIGH (ref 70–99)
Glucose-Capillary: 121 mg/dL — ABNORMAL HIGH (ref 70–99)

## 2021-07-11 MED ORDER — POTASSIUM CHLORIDE 20 MEQ PO PACK
20.0000 meq | PACK | Freq: Every day | ORAL | Status: DC
  Administered 2021-07-11 – 2021-07-13 (×3): 20 meq via ORAL
  Filled 2021-07-11 (×3): qty 1

## 2021-07-11 NOTE — Progress Notes (Signed)
Pt not I his room.  Attempted to give 4 pm medications.  Pt did not alert staff before leaving the unit.

## 2021-07-11 NOTE — Progress Notes (Signed)
Right lower extremity venous duplex has been completed. Preliminary results can be found in CV Proc through chart review.   07/11/21 11:00 AM Russell Collier RVT

## 2021-07-11 NOTE — Progress Notes (Signed)
Security paged to assist with locating patient

## 2021-07-11 NOTE — Consult Note (Signed)
[facility] Nurse Consult Note: Reason for Consult:RLE with cellulitis. Patient now on systemic antibiotics. Superficial peeling at the posterior calf, no wound or weeping at this time. Wound type:infectious Pressure Injury POA: N/A Drainage (amount, consistency, odor) None Periwound: As noted above Dressing procedure/placement/frequency: I have provided guidance for Nursing via the Orders for the care of this extremity using a daily soap and water cleanse, rinse, and gentle pat dry followed by application of our house moisturizer, Sween 24. The LE is to be placed on a pillow while in bed or chair and our therapeutic bed linen system, DermaTherapy (antimicrobial, low CoF) will further augment the POC. The heel will be floated for PI prevention.  A sacral silicone foam dressing is to be placed prophylactically for PI prevention as well.  I communicated these recommendations to Dr. Arby Barrette via [place].   [facility] nursing team will not follow, but will remain available to this patient, the nursing and medical teams.  Please re-consult if needed. Thanks, Maudie Flakes, MSN, RN, [place], Arther Abbott  Pager# [phone]

## 2021-07-11 NOTE — Progress Notes (Signed)
FPTS Interim Night Progress Note  S:Patient sleeping comfortably.  Rounded with primary night RN.  No concerns voiced.  No orders required.    O: Today's Vitals   07/11/21 0738 07/11/21 0740 07/11/21 1351 07/11/21 1920  BP:  135/68 126/75   Pulse:  77 72   Resp:  17    Temp:  98 F (36.7 C) 98.1 F (36.7 C)   TempSrc:  Oral Oral   SpO2:  98% 100%   Weight:      Height:      PainSc: 0-No pain   0-No pain      A/P: Continue current management  Carollee Leitz MD PGY-3, [place] Medicine Service pager [phone]

## 2021-07-11 NOTE — Progress Notes (Signed)
Pt was found sitting in his room.  He stated "I've been in this room the whole afternoon.  You did see me sitting here."

## 2021-07-11 NOTE — Evaluation (Signed)
Occupational Therapy Evaluation Patient Details Name: Russell Collier MRN: [id number] DOB: 27-Feb-1960 Today's Date: 07/11/2021   History of Present Illness 61 y.o. male was admitted 5/18 for atypical chest pain and noted worsening of RLE cellulitis.  Pt in CHF, but resolved his chest pain.  Found no other acute issues, cleared for susp PE and worked on management of infection on RLE with ABT.  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   Clinical Impression   Russell Collier was evaluated s/p the above admission list, he is generally indep at baseline including working and driving. He does have [address] his home and have 24/7 family support of needed. Upon evaluation pt demonstrated mod I functional mobility with SPC and ADLs, stating that he independently took a shower this a.m. and has been walking a lot today. Encouraged pt to continue to be as indep as possible. He does not have further acute OT needs. Recommend d/c home with support as needed.      Recommendations for follow up therapy are one component of a multi-disciplinary discharge planning process, led by the attending physician.  Recommendations may be updated based on patient status, additional functional criteria and insurance authorization.   Follow Up Recommendations  No OT follow up    Assistance Recommended at Discharge PRN  Patient can return home with the following A little help with walking and/or transfers    Functional Status Assessment  Patient has had a recent decline in their functional status and demonstrates the ability to make significant improvements in function in a reasonable and predictable amount of time.  Equipment Recommendations  Tub/shower seat       Precautions / Restrictions Precautions Precautions: Fall Restrictions Weight Bearing Restrictions: No      Mobility Bed Mobility Overal bed mobility:  Independent                  Transfers Overall transfer level: Modified independent Equipment used: Straight cane                      Balance Overall balance assessment: No apparent balance deficits (not formally assessed)                                         ADL either performed or assessed with clinical judgement   ADL Overall ADL's : Modified independent               General ADL Comments: mod I for all ADLs and functinoal mobility with SPC     Vision Baseline Vision/History: 0 No visual deficits Vision Assessment?: No apparent visual deficits            Pertinent Vitals/Pain Pain Assessment Pain Assessment: Faces Faces Pain Scale: Hurts a little bit Pain Location: RLE Pain Descriptors / Indicators: Guarding Pain Intervention(s): Limited activity within patient's tolerance, Monitored during session     Hand Dominance Right   Extremity/Trunk Assessment Upper Extremity Assessment Upper Extremity Assessment: Overall WFL for tasks assessed   Lower Extremity Assessment Lower Extremity Assessment: Defer to PT evaluation   Cervical / Trunk Assessment Cervical / Trunk Assessment: Normal   Communication Communication Communication: No difficulties   Cognition Arousal/Alertness: Awake/alert Behavior During Therapy: WFL for tasks assessed/performed Overall Cognitive Status: Within Functional Limits for tasks assessed  General Comments: great insight     General Comments  VSS on RA     Home Living Family/patient expects to be discharged to:: Private residence Living Arrangements: Other relatives Available Help at Discharge: Family;Available 24 hours/day Type of Home: Apartment Home Access: Stairs to enter [employer] of Steps: 21 Entr[address] Stairs-Rails: Can reach both Home Layout: Two level         Bathroom Toilet: Standard     Home Equipment: Cane - single point    Additional Comments: only has a cane as he was so independent previously      Prior Functioning/Environment Prior Level of Function : Independent/Modified Independent;Working/employed             Mobility Comments: SPC needed since his RLE has been infected with cellulitis ADLs Comments: indep        OT Problem List: Decreased activity tolerance      OT Treatment/Interventions:      OT Goals(Current goals can be found in the care plan section) Acute Rehab OT Goals Patient Stated Goal: home OT Goal Formulation: With patient Time For Goal Achievement: 07/11/21 Potential to Achieve Goals: Good  OT Frequency:         AM-PAC OT "6 Clicks" Daily Activity     Outcome Measure Help from another person eating meals?: None Help from another person taking care of personal grooming?: None Help from another person toileting, which includes using toliet, bedpan, or urinal?: None Help from another person bathing (including washing, rinsing, drying)?: None Help from another person to put on and taking off regular upper body clothing?: None Help from another person to put on and taking off regular lower body clothing?: None 6 Click Score: 24   End of Session Equipment Utilized During Treatment: Other (comment) [facility]) Nurse Communication: Mobility status  Activity Tolerance: Patient tolerated treatment well Patient left: Other (comment) (walking in hallway)  OT Visit Diagnosis: Unsteadiness on feet (R26.81);Pain                Time: 1435-1446 OT Time Calculation (min): 11 min Charges:  OT General Charges $OT Visit: 1 Visit OT Evaluation $OT Eval Low Complexity: 1 Low   Catrena Vari A Embrie Mikkelsen 07/11/2021, 2:58 PM

## 2021-07-11 NOTE — Progress Notes (Signed)
Mobility Specialist Progress Note   07/11/21 1240  Mobility  Activity Ambulated independently in hallway  Level of Assistance Modified independent, requires aide device or extra time  Assistive Device [facility] Ambulated (ft) 210 ft  Activity Response Tolerated well  $Mobility charge 1 Mobility   Received pt in hallway having no complaints and agreeable to mobility. Asymptomatic throughout ambulation, returned back to chair w/ call bell in reach and all needs met.  [name] Falling Mobility Specialist Phone Number [phone]

## 2021-07-11 NOTE — Progress Notes (Signed)
Pt expressed desire to go outside.  Advised that he is not allowed to leave the unit  without a MD order. Pt v/u understanding and stated that he was going outside with his family sometime this afternoon and did not care.  Advised pt that nurse is not responsible for his safety if he left the unit.  Pt v/u and agreed with plan.

## 2021-07-11 NOTE — Plan of Care (Signed)

## 2021-07-12 DIAGNOSIS — I5032 Chronic diastolic (congestive) heart failure: Secondary | ICD-10-CM

## 2021-07-12 DIAGNOSIS — L03115 Cellulitis of right lower limb: Secondary | ICD-10-CM | POA: Diagnosis not present

## 2021-07-12 LAB — CBC
HCT: 39.3 % (ref 39.0–52.0)
Hemoglobin: 13 g/dL (ref 13.0–17.0)
MCH: 29.6 pg (ref 26.0–34.0)
MCHC: 33.1 g/dL (ref 30.0–36.0)
MCV: 89.5 fL (ref 80.0–100.0)
Platelets: 272 10*3/uL (ref 150–400)
RBC: 4.39 MIL/uL (ref 4.22–5.81)
RDW: 15.9 % — ABNORMAL HIGH (ref 11.5–15.5)
WBC: 11 10*3/uL — ABNORMAL HIGH (ref 4.0–10.5)
nRBC: 0 % (ref 0.0–0.2)

## 2021-07-12 LAB — BASIC METABOLIC PANEL
Anion gap: 9 (ref 5–15)
BUN: 30 mg/dL — ABNORMAL HIGH (ref 8–23)
CO2: 24 mmol/L (ref 22–32)
Calcium: 8.9 mg/dL (ref 8.9–10.3)
Chloride: 103 mmol/L (ref 98–111)
Creatinine, Ser: 1.34 mg/dL — ABNORMAL HIGH (ref 0.61–1.24)
GFR, Estimated: 60 mL/min — ABNORMAL LOW (ref >60)
Glucose, Bld: 128 mg/dL — ABNORMAL HIGH (ref 70–99)
Potassium: 3.8 mmol/L (ref 3.5–5.1)
Sodium: 136 mmol/L (ref 135–145)

## 2021-07-12 LAB — GLUCOSE, CAPILLARY
Glucose-Capillary: 121 mg/dL — ABNORMAL HIGH (ref 70–99)
Glucose-Capillary: 112 mg/dL — ABNORMAL HIGH (ref 70–99)
Glucose-Capillary: 121 mg/dL — ABNORMAL HIGH (ref 70–99)
Glucose-Capillary: 133 mg/dL — ABNORMAL HIGH (ref 70–99)

## 2021-07-12 MED ORDER — DOXYCYCLINE HYCLATE 100 MG PO TABS
100.0000 mg | ORAL_TABLET | Freq: Two times a day (BID) | ORAL | Status: DC
  Administered 2021-07-12 – 2021-07-13 (×3): 100 mg via ORAL
  Filled 2021-07-12 (×3): qty 1

## 2021-07-12 MED ORDER — ENALAPRIL MALEATE 5 MG PO TABS
20.0000 mg | ORAL_TABLET | Freq: Every day | ORAL | Status: DC
Start: 2021-07-12 — End: 2021-07-13
  Administered 2021-07-12: 20 mg via ORAL
  Filled 2021-07-12: qty 1
  Filled 2021-07-12 (×2): qty 4

## 2021-07-12 NOTE — Progress Notes (Signed)
Family Medicine Teaching Service Daily Progress Note Intern Pager: [phone]  Patient name: Nazire Fruth Medical record number: [id number] Date of birth: 03/26/1960 Age: 61 y.o. Gender: male  Primary Care Provider: [name], MD Consultants: WOUND CARE Code Status: FC  Pt Overview and Major Events to Date:  5/18: admitted, started on Vanc  5/20: negative DVT study, continued vanc, blood cultures neg 24hr  5/21: switch to PO doxycycline, blood cultures negative   Assessment and Plan: Josep Luviano is a 61 y.o. male who presented with RLE cellulitis. PMH is significant for enterobacter urosepsis, familial cardiac amyloidosis, PE, T2DM, COPD and OSA.   RLE Cellulitis Patient has bene on abx treatment for three days including vanc. Blood cultures are without growth at 48 hours. Patient was afebrile overnight, subjectively reports improved pain and redness of RLE. WBC is 11 today. Evaluated by OT with recommendation for no  outpatient follow up. Patient able to ambulate to restroom independently. DVT US on 5/20 negative - DC Vanc and transition to PO Doxy '100mg'$  BID x4 days - monitor for fever  - f/u blood cultures  - continue with wound care recommendations for cleaning and dressing   HFpEF  UOP 2150 in last 24 hours.  K WNL, 3.8 - continue lasix '60mg'$  daily (continue K68m daily)  - continue Imdur '30mg'$  daily   Cardiac Amyloidosis  - continue home tafamidis 61  Hx of PE  -continue home eliquis   T2DM, controlled  BP within goal range for hospitalized patient, 128 today - continue '10mg'$  Farxiga  - DC sSSI   HTN  Home medication includes ACEi, creatinine slightly elevated at 1.3. BP with one elevated reading overnight. This AM, 139/68.  - restart home enalapril 20   COPD, stable  Continue Anoro Ellipta   HLD  Continue home lipitor '40mg'$  daily  GERD protonix 40 mg   Neuropathy  Continue home lyrica '100mg'$  TID  HSV: continue home Valtrex '500mg'$  daily   Tobacco Use:  nicotine patch   FEN/GI: carb modified diet  PPx: on home Eliquis   Dispo:Home tomorrow. Barriers include abx management.   Subjective:  Patient reports that he thinks his leg swelling has improved. He denies SOB or increased leg pain.   Objective: Temp:  [98 F (36.7 C)-98.2 F (36.8 C)] 98.2 F (36.8 C) (05/21 0426) Pulse Rate:  [72-79] 73 (05/21 0426) Resp:  [17-18] 18 (05/20 2018) BP: (126-153)/(68-78) 139/68 (05/21 0426) SpO2:  [96 %-100 %] 96 % (05/21 0426)  Physical Exam: General: obese male in NAD sitting upright in hospital bed, NAD  Cardiovascular: RRR  Respiratory: normal respiratory effort on RA, no wheezing appreciated on exam Abdomen: no tenderness, BS normal Extremities: RLE with erythema in areas of calf and shin, no streaking of erythema visualized in the area of the thigh or foot, patient able to bear weight and ambulate to restroom, lower extremity warm to touch, dorsalis pedis and posterior TB pulses palpable or RLE, normal appearing LLE without erythema, bilateral 1+ pitting edema  Laboratory: Recent Labs  Lab 07/10/21 0222 07/11/21 0215 07/12/21 0129  WBC 9.1 8.8 11.0*  HGB 11.9* 12.6* 13.0  HCT 35.3* 37.0* 39.3  PLT 244 261 272   Recent Labs  Lab 07/10/21 0222 07/11/21 0215 07/12/21 0129  NA 138 136 136  K 3.7 3.5 3.8  CL 107 104 103  CO2 '24 24 24  '$ BUN 19 24* 30*  CREATININE 1.33* 1.30* 1.34*  CALCIUM 8.6* 8.6* 8.9  GLUCOSE 158* 124* 128*  Eulis Foster, MD 07/12/2021, 6:10 AM PGY-3, [place] Intern pager: [phone], text pages welcome

## 2021-07-13 ENCOUNTER — Other Ambulatory Visit (HOSPITAL_COMMUNITY): Payer: Self-pay

## 2021-07-13 ENCOUNTER — Telehealth (HOSPITAL_BASED_OUTPATIENT_CLINIC_OR_DEPARTMENT_OTHER): Payer: Self-pay | Admitting: Physical Therapy

## 2021-07-13 ENCOUNTER — Ambulatory Visit (HOSPITAL_BASED_OUTPATIENT_CLINIC_OR_DEPARTMENT_OTHER): Payer: Medicaid Other | Admitting: Physical Therapy

## 2021-07-13 DIAGNOSIS — L03115 Cellulitis of right lower limb: Secondary | ICD-10-CM | POA: Diagnosis not present

## 2021-07-13 DIAGNOSIS — M5136 Other intervertebral disc degeneration, lumbar region: Secondary | ICD-10-CM | POA: Diagnosis not present

## 2021-07-13 LAB — BASIC METABOLIC PANEL
Anion gap: 8 (ref 5–15)
BUN: 35 mg/dL — ABNORMAL HIGH (ref 8–23)
CO2: 27 mmol/L (ref 22–32)
Calcium: 9.1 mg/dL (ref 8.9–10.3)
Chloride: 104 mmol/L (ref 98–111)
Creatinine, Ser: 1.37 mg/dL — ABNORMAL HIGH (ref 0.61–1.24)
GFR, Estimated: 58 mL/min — ABNORMAL LOW (ref >60)
Glucose, Bld: 141 mg/dL — ABNORMAL HIGH (ref 70–99)
Potassium: 3.3 mmol/L — ABNORMAL LOW (ref 3.5–5.1)
Sodium: 139 mmol/L (ref 135–145)

## 2021-07-13 LAB — CBC
HCT: 39.7 % (ref 39.0–52.0)
Hemoglobin: 13.2 g/dL (ref 13.0–17.0)
MCH: 29.5 pg (ref 26.0–34.0)
MCHC: 33.2 g/dL (ref 30.0–36.0)
MCV: 88.6 fL (ref 80.0–100.0)
Platelets: 265 10*3/uL (ref 150–400)
RBC: 4.48 MIL/uL (ref 4.22–5.81)
RDW: 15.7 % — ABNORMAL HIGH (ref 11.5–15.5)
WBC: 9.5 10*3/uL (ref 4.0–10.5)
nRBC: 0 % (ref 0.0–0.2)

## 2021-07-13 LAB — GLUCOSE, CAPILLARY
Glucose-Capillary: 146 mg/dL — ABNORMAL HIGH (ref 70–99)
Glucose-Capillary: 118 mg/dL — ABNORMAL HIGH (ref 70–99)

## 2021-07-13 MED ORDER — DOXYCYCLINE HYCLATE 100 MG PO TABS
100.0000 mg | ORAL_TABLET | Freq: Two times a day (BID) | ORAL | 0 refills | Status: DC
  Filled 2021-07-13: qty 5, 3d supply, fill #0

## 2021-07-13 MED ORDER — ACETAMINOPHEN 325 MG PO TABS: 650.0000 mg | ORAL_TABLET | Freq: Four times a day (QID) | ORAL | 0 refills | Status: AC | PRN

## 2021-07-13 MED ORDER — POTASSIUM CHLORIDE CRYS ER 20 MEQ PO TBCR
40.0000 meq | EXTENDED_RELEASE_TABLET | Freq: Once | ORAL | Status: AC
  Administered 2021-07-13: 40 meq via ORAL
  Filled 2021-07-13: qty 2

## 2021-07-13 NOTE — Progress Notes (Signed)
Discharge instructions (including medications) discussed with and copy provided to patient/caregiver 

## 2021-07-13 NOTE — Progress Notes (Signed)
Physical Therapy Treatment Patient Details Name: Russell Collier MRN: [id number] DOB: 1961/01/03 Today's Date: 07/13/2021   History of Present Illness 61 y.o. male was admitted 5/18 for atypical chest pain and noted worsening of RLE cellulitis.  Pt in CHF, but resolved his chest pain.  Found no other acute issues, cleared for susp PE and worked on management of infection on RLE with ABT.  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    PT Comments    Pt received OOB in recliner and agreeable to session with great progress towards goals, however RLE continues to be mildly unstable secondary to pain. Pt demonstrating ability for increased ambulation distance at supervision level with no LOB and good stability. Continued stair training this session with pt requiring cues for step to step gait for safety and stability with good return with pt demonstrating no LOB. Pt educated re; pacing, increasing activity tolerance, energy conservation and activity recommendations, pt verbalizing understanding. Pt continues to benefit from skilled PT services to progress toward functional mobility goals.    Recommendations for follow up therapy are one component of a multi-disciplinary discharge planning process, led by the attending physician.  Recommendations may be updated based on patient status, additional functional criteria and insurance authorization.  Follow Up Recommendations  Home health PT     Assistance Recommended at Discharge Set up Supervision/Assistance  Patient can return home with the following A little help with bathing/dressing/bathroom;Assistance with cooking/housework;Assist for transportation;Help with stairs or ramp for entrance   Equipment Recommendations  None recommended by PT    Recommendations for Other Services       Precautions / Restrictions Precautions Precautions:  Fall Precaution Comments: unstable on RLE Restrictions Weight Bearing Restrictions: No     Mobility  Bed Mobility Overal bed mobility: Independent             General bed mobility comments: up in chair when PT arrived    Transfers Overall transfer level: Modified independent Equipment used: Straight cane Transfers: Sit to/from Stand Sit to Stand: Modified independent (Device/Increase time)           General transfer comment: extra time to stand    Ambulation/Gait Ambulation/Gait assistance: Supervision, Min guard Gait Distance (Feet): 520 Feet (seated rest x2) Assistive device: Straight cane Gait Pattern/deviations: Step-through pattern, Decreased stride length, Wide base of support Gait velocity: reduced     General Gait Details: pt controls wgt shift onto RLE, using SPC and with lateral shift to rely on LLE   Stairs Stairs: Yes Stairs assistance: Min guard, Min assist Stair Management: One rail Right, One rail Left, Forwards, Step to pattern Number of Stairs: 24 (flight x2) General stair comments: requires cues for seuqencing the SPC to avoid giving out on RLE, cues for step to pattern for energy coservation and stability   Wheelchair Mobility    Modified Rankin (Stroke Patients Only)       Balance Overall balance assessment: No apparent balance deficits (not formally assessed) Sitting-balance support: Feet supported Sitting balance-Leahy Scale: Good     Standing balance support: Single extremity supported Standing balance-Leahy Scale: Fair                              Cognition Arousal/Alertness: Awake/alert Behavior During Therapy: WFL for tasks assessed/performed Overall Cognitive Status: Within Functional Limits for tasks assessed  General Comments: great insight        Exercises      General Comments        Pertinent Vitals/Pain Pain Assessment Pain Assessment:  0-10 Pain Score: 6  Pain Location: RLE Pain Descriptors / Indicators: Guarding Pain Intervention(s): Limited activity within patient's tolerance, Monitored during session    Home Living                          Prior Function            PT Goals (current goals can now be found in the care plan section) Acute Rehab PT Goals Patient Stated Goal: to walk and get home PT Goal Formulation: With patient Time For Goal Achievement: 07/17/21    Frequency    Min 3X/week      PT Plan      Co-evaluation              AM-PAC PT "6 Clicks" Mobility   Outcome Measure  Help needed turning from your back to your side while in a flat bed without using bedrails?: None Help needed moving from lying on your back to sitting on the side of a flat bed without using bedrails?: A Little Help needed moving to and from a bed to a chair (including a wheelchair)?: A Little Help needed standing up from a chair using your arms (e.g., wheelchair or bedside chair)?: A Little Help needed to walk in hospital room?: A Little Help needed climbing 3-5 steps with a railing? : A Little 6 Click Score: 19    End of Session Equipment Utilized During Treatment: Gait belt Activity Tolerance: Patient tolerated treatment well Patient left: with call bell/phone within reach;in bed (seated EOB) Nurse Communication: Mobility status PT Visit Diagnosis: Unsteadiness on feet (R26.81);Muscle weakness (generalized) (M62.81);Pain;Difficulty in walking, not elsewhere classified (R26.2) Pain - Right/Left: Right Pain - part of body: Leg     Time: 0900-0920 PT Time Calculation (min) (ACUTE ONLY): 20 min  Charges:  $Gait Training: 8-22 mins                     Inari Shin R. PTA Acute Rehabilitation Services Office: [place] 07/13/2021, 10:07 AM

## 2021-07-13 NOTE — Telephone Encounter (Signed)
PT spoke with pt.  He continues to have issues with his LE having cellulitis.  He was re-admitted into the hospital.  He reports he just left the hospital.  PT will cancel his PT appt today.  PT informed pt he will but put on hold until his infection clears up and he has a new order for PT.  PT informed pt he needs another MD order before returning to PT.  Pt understands and is in agreement.   Selinda Michaels III PT, DPT 07/13/21 2:15 PM

## 2021-07-13 NOTE — Discharge Summary (Signed)
Family [facility] Teaching [facility] Discharge Summary  Patient name: Russell Collier Medical record number: [id number] Date of birth: 05/14/1960 Age: 61 y.o. Gender: male Date of Admission: 07/09/2021  Date of Discharge: 07/13/2021 Admitting Physician: Carina M Brown, MD  Primary Care Provider: Cresenzo, Victor, MD Consultants: None  Indication for Hospitalization: Right lower extremity pain and swelling  Discharge Diagnoses/Problem List:  Principal Problem:   Cellulitis Active Problems:   Chronic congestive heart failure (HCC)   Pulmonary hypertension, primary (HCC)   Disposition: Home  Discharge Condition: Stable  Discharge Exam:  General: Sitting upright in chair in no acute distress CV: RRR Resp: Normal WOB, lungs clear in all fields Abdomen: Soft, NTND, bowel sounds Ext: Improved erythema and swelling on posterior right calf, no drainage or purulence. Skin: Warm, dry, well-perfused  Brief Hospital Course:  Russell Collier is a 61-year-old male presenting with worsening right lower extremity cellulitis.  Past medical history significant for Enterobacter urosepsis, familial cardiac amyloidosis, PE, T2DM, COPD, and OSA.  RLE cellulitis Ongoing since admission 5/7; failed Keflex 500 mg 3 times daily x7 days.  Since patient failed MSSA coverage, treated for MRSA this admission with IV Vancomycin 2.5g x1, then 1.75g x 2 to which he responded well.  Patient remained afebrile, with WBC WNL, lactic acid 1.3, and without subjective symptoms of infection throughout admission. Blood cultures with NGTD. DVT ultrasound was negative. Patient switched to PO Doxycycline on 5/21. Pt should continue this for total duration of 4 days.   Tobacco use disorder Patient reports smoking 20 cigars per day. Cravings somewhat controlled with nicotine patch 21 mg daily. Tobacco cessation counseling provided and patient to be prescribed nicotine patches on discharge.  HTN Patient with soft BP  throughout admission. SBP 100s-110s. Imdur 30 mg resumed on day of admission, and Lasix restarted on day 2 of admission to prevent HF exacerbation. Enalapril initially held but then continued day before discharge.  Chronic, stable conditions managed with home meds: Hx of PE Familial cardiac amyloidosis HFmrEF CAD COPD OSA HLD T2DM HSV, dormant  Follow-Up Concerns: Repeat BMP to ensure K+ in goal range and continued resolution of AKI Cellulitis: With great resolve of erythema and warmth while in hospital. Assess edema and pain. Continue Doxycycline for 2 more days after discharge (Start date 5/21; End date: 5/25)  Advised against NSAID use as pt is on Eliquis.   Significant Procedures: None  Significant Labs and Imaging:  Recent Labs  Lab 07/11/21 0215 07/12/21 0129 07/13/21 0404  WBC 8.8 11.0* 9.5  HGB 12.6* 13.0 13.2  HCT 37.0* 39.3 39.7  PLT 261 272 265   Recent Labs  Lab 07/09/21 1424 07/09/21 1433 07/10/21 0222 07/11/21 0215 07/12/21 0129 07/13/21 0404  NA 138 140 138 136 136 139  K 3.9 3.9 3.7 3.5 3.8 3.3*  CL 108 105 107 104 103 104  CO2 22  --  24 24 24 27  GLUCOSE 138* 137* 158* 124* 128* 141*  BUN 20 21 19 24* 30* 35*  CREATININE 1.43* 1.40* 1.33* 1.30* 1.34* 1.37*  CALCIUM 8.8*  --  8.6* 8.6* 8.9 9.1     Results/Tests Pending at Time of Discharge: None  Discharge Medications:  Allergies as of 07/13/2021       Reactions   Varenicline Anaphylaxis, Swelling, Other (See Comments)   Patient reports laryngospasm stopped in the ER, throat swelling   Bupropion Other (See Comments)   Headache - moderate/severe - self discontinued agent    Nicoderm [nicotine] Rash          Medication List     STOP taking these medications    cephALEXin 500 MG capsule Commonly known as: KEFLEX   ibuprofen 200 MG tablet Commonly known as: ADVIL       TAKE these medications    Accu-Chek Guide test strip Generic drug: glucose blood Use as instructed    Accu-Chek Guide w/Device Kit 1 Units by Does not apply route daily.   Accu-Chek Softclix Lancets lancets Use as instructed   acetaminophen 325 MG tablet Commonly known as: TYLENOL Take 2 tablets (650 mg total) by mouth every 6 (six) hours as needed for mild pain or moderate pain.   albuterol 108 (90 Base) MCG/ACT inhaler Commonly known as: VENTOLIN HFA Inhale 2 puffs into the lungs every 6 (six) hours as needed for wheezing or shortness of breath.   albuterol (2.5 MG/3ML) 0.083% nebulizer solution Commonly known as: PROVENTIL Take 3 mLs (2.5 mg total) by nebulization every 6 (six) hours as needed for wheezing or shortness of breath.   Amvuttra 25 MG/0.5ML syringe Generic drug: vutrisiran sodium Inject 0.5 mLs (25 mg total) into the skin every 3 (three) months.   Anoro Ellipta 62.5-25 MCG/ACT Aepb Generic drug: umeclidinium-vilanterol INHALE 1 PUFF INTO THE LUNGS DAILY AS NEEDED. What changed: See the new instructions.   apixaban 2.5 MG Tabs tablet Commonly known as: Eliquis Take 1 tablet (2.5 mg total) by mouth 2 (two) times daily.   atorvastatin 80 MG tablet Commonly known as: Lipitor Take 1 tablet (80 mg total) by mouth daily.   doxycycline 100 MG tablet Commonly known as: VIBRA-TABS Take 1 tablet (100 mg total) by mouth every 12 (twelve) hours.   enalapril 20 MG tablet Commonly known as: VASOTEC Take 1 tablet (20 mg total) by mouth at bedtime.   Farxiga 10 MG Tabs tablet Generic drug: dapagliflozin propanediol TAKE 1 TABLET (10 MG TOTAL) BY MOUTH DAILY BEFORE BREAKFAST. What changed: how much to take   fluticasone 50 MCG/ACT nasal spray Commonly known as: FLONASE Place 1 spray into both nostrils daily. What changed:  when to take this reasons to take this   furosemide 40 MG tablet Commonly known as: LASIX Take 1.5 tablets (60 mg total) by mouth 2 (two) times daily.   isosorbide mononitrate 30 MG 24 hr tablet Commonly known as: IMDUR Take 1 tablet (30  mg total) by mouth daily.   multivitamin Tabs tablet Take 1 tablet by mouth daily with breakfast.   pantoprazole 40 MG tablet Commonly known as: PROTONIX TAKE 1 TABLET (40 MG TOTAL) BY MOUTH DAILY. What changed: when to take this   potassium chloride SA 20 MEQ tablet Commonly known as: KLOR-CON M Take 2 tablets (40 mEq total) by mouth daily. What changed: how much to take   pregabalin 100 MG capsule Commonly known as: LYRICA TAKE ONE CAPSULE BY MOUTH THREE TIMES DAILY   triamcinolone ointment 0.1 % Commonly known as: KENALOG Apply 1 application topically 2 (two) times daily. What changed:  when to take this reasons to take this   valACYclovir 500 MG tablet Commonly known as: Valtrex Take 1 tablet (500 mg total) by mouth daily.   Vitamin A 2400 MCG (8000 UT) Tabs Take 1 tablet by mouth daily. What changed: how much to take   Vyndamax 61 MG Caps Generic drug: Tafamidis TAKE 1 CAPSULE BY MOUTH DAILY. What changed: how much to take        Discharge Instructions: Please refer to Patient Instructions section of EMR for full details.    Patient was counseled important signs and symptoms that should prompt return to medical care, changes in medications, dietary instructions, activity restrictions, and follow up appointments.   Follow-Up Appointments:  Follow-up Information     Cresenzo, Victor, MD. Go on 07/17/2021.   Specialty: Family Medicine Why: At 10:10 am. Please arrive by 9:55 am. This is your hospital follow up appointment with your primary care doctor, Dr. Crescenzo. Contact information: [address] [place] [address] [phone]         Turner, Traci R, MD .   Specialty: Cardiology Contact information: [address] [place] [address] [phone]                 Dameron, Marisa, DO 07/13/2021, 1:57 PM PGY-1, [facility]  

## 2021-07-13 NOTE — Plan of Care (Signed)
  Problem: Education: Goal: Knowledge of General Education information will improve Description: Including pain rating scale, medication(s)/side effects and non-pharmacologic comfort measures 07/13/2021 1316 by Rudene Christians, RN Outcome: Adequate for Discharge 07/13/2021 1316 by Rudene Christians, RN Outcome: Adequate for Discharge   Problem: Health Behavior/Discharge Planning: Goal: Ability to manage health-related needs will improve 07/13/2021 1316 by Rudene Christians, RN Outcome: Adequate for Discharge 07/13/2021 1316 by Rudene Christians, RN Outcome: Adequate for Discharge   Problem: Clinical Measurements: Goal: Ability to maintain clinical measurements within normal limits will improve 07/13/2021 1316 by Rudene Christians, RN Outcome: Adequate for Discharge 07/13/2021 1316 by Rudene Christians, RN Outcome: Adequate for Discharge Goal: Will remain free from infection 07/13/2021 1316 by Rudene Christians, RN Outcome: Adequate for Discharge 07/13/2021 1316 by Rudene Christians, RN Outcome: Adequate for Discharge Goal: Diagnostic test results will improve 07/13/2021 1316 by Rudene Christians, RN Outcome: Adequate for Discharge 07/13/2021 1316 by Rudene Christians, RN Outcome: Adequate for Discharge Goal: Respiratory complications will improve 07/13/2021 1316 by Rudene Christians, RN Outcome: Adequate for Discharge 07/13/2021 1316 by Rudene Christians, RN Outcome: Adequate for Discharge Goal: Cardiovascular complication will be avoided 07/13/2021 1316 by Rudene Christians, RN Outcome: Adequate for Discharge 07/13/2021 1316 by Rudene Christians, RN Outcome: Adequate for Discharge   Problem: Activity: Goal: Risk for activity intolerance will decrease 07/13/2021 1316 by Rudene Christians, RN Outcome: Adequate for Discharge 07/13/2021 1316 by Rudene Christians, RN Outcome: Adequate for Discharge   Problem: Nutrition: Goal: Adequate nutrition will be maintained 07/13/2021 1316 by Rudene Christians, RN Outcome:  Adequate for Discharge 07/13/2021 1316 by Rudene Christians, RN Outcome: Adequate for Discharge   Problem: Coping: Goal: Level of anxiety will decrease 07/13/2021 1316 by Rudene Christians, RN Outcome: Adequate for Discharge 07/13/2021 1316 by Rudene Christians, RN Outcome: Adequate for Discharge   Problem: Elimination: Goal: Will not experience complications related to bowel motility 07/13/2021 1316 by Rudene Christians, RN Outcome: Adequate for Discharge 07/13/2021 1316 by Rudene Christians, RN Outcome: Adequate for Discharge Goal: Will not experience complications related to urinary retention 07/13/2021 1316 by Rudene Christians, RN Outcome: Adequate for Discharge 07/13/2021 1316 by Rudene Christians, RN Outcome: Adequate for Discharge   Problem: Pain Managment: Goal: General experience of comfort will improve 07/13/2021 1316 by Rudene Christians, RN Outcome: Adequate for Discharge 07/13/2021 1316 by Rudene Christians, RN Outcome: Adequate for Discharge   Problem: Safety: Goal: Ability to remain free from injury will improve 07/13/2021 1316 by Rudene Christians, RN Outcome: Adequate for Discharge 07/13/2021 1316 by Rudene Christians, RN Outcome: Adequate for Discharge   Problem: Skin Integrity: Goal: Risk for impaired skin integrity will decrease 07/13/2021 1316 by Rudene Christians, RN Outcome: Adequate for Discharge 07/13/2021 1316 by Rudene Christians, RN Outcome: Adequate for Discharge

## 2021-07-13 NOTE — Progress Notes (Addendum)
FPTS Brief Progress Note  S:Patient sleeping comfortably. Discussed patient with RN, who voiced no acute concerns or complaints.   O: BP 139/78 (BP Location: Right Arm)   Pulse 83   Temp 98.4 F (36.9 C) (Oral)   Resp 18   Ht '5\' 10"'$  (1.778 m)   Wt (!) 141.5 kg   SpO2 96%   BMI 44.77 kg/m     A/P: RLE Cellulitis Blood cultures with NGTD, WBC WNL, VSS, LE Doppler without evidence of DVT. Wound care instructions given. -Continue management per day team - If condition worsens, reassess abx and repeat blood cultures.  -AM CBC  - Orders reviewed. Labs for AM ordered, which were adjusted as needed.    Rosezetta Schlatter, MD 07/13/2021, 12:12 AM PGY-1, [facility]  Please page [phone] with questions.

## 2021-07-13 NOTE — TOC Transition Note (Signed)
Transition of Care [facility]) - CM/SW Discharge Note   Patient Details  Name: Russell Collier MRN: [id number] Date of Birth: 1960/03/02  [facility] CM/SW Contact:  Bartholomew Crews, RN Phone Number: [phone] 07/13/2021, 11:41 AM   Clinical Narrative:     Spoke with patient at the bedside to discuss post acute transition. Discussed recommendations for [facility] PT. Patient stated that he attends outpatient therapy at [facility] and would like to continue with that, and stated that he actually had an appointment for today. Patient stated he is to see his cardiologist tomorrow. Patient stated that he called someone to pick him up, but if that falls through may need a taxi voucher stating he cannot use a taxi d/t normally ambulating with a rollator. No further TOC needs identified at this time.   Final next level of care: OP Rehab Barriers to Discharge: No Barriers Identified   Patient Goals and CMS Choice Patient states their goals for this hospitalization and ongoing recovery are:: return home CMS Medicare.gov Compare Post Acute Care list provided to:: Patient Choice offered to / list presented to : Patient  Discharge Placement                       Discharge Plan and Services                DME Arranged: N/A DME Agency: Other - Comment       HH Arranged: NA HH Agency: NA        Social Determinants of Health (SDOH) Interventions     Readmission Risk Interventions     View : No data to display.

## 2021-07-14 ENCOUNTER — Encounter (HOSPITAL_COMMUNITY): Payer: Self-pay | Admitting: Internal Medicine

## 2021-07-14 ENCOUNTER — Ambulatory Visit (HOSPITAL_COMMUNITY)
Admission: RE | Admit: 2021-07-14 | Discharge: 2021-07-14 | Disposition: A | Discharge status: Home / Self Care | Payer: Medicaid Other | Source: Ambulatory Visit | Attending: Internal Medicine | Admitting: Internal Medicine

## 2021-07-14 VITALS — BP 106/66 | HR 92 | Wt 308.4 lb

## 2021-07-14 DIAGNOSIS — G4733 Obstructive sleep apnea (adult) (pediatric): Secondary | ICD-10-CM | POA: Diagnosis not present

## 2021-07-14 DIAGNOSIS — F17210 Nicotine dependence, cigarettes, uncomplicated: Secondary | ICD-10-CM | POA: Diagnosis not present

## 2021-07-14 DIAGNOSIS — L03115 Cellulitis of right lower limb: Secondary | ICD-10-CM | POA: Insufficient documentation

## 2021-07-14 DIAGNOSIS — Z7984 Long term (current) use of oral hypoglycemic drugs: Secondary | ICD-10-CM | POA: Diagnosis not present

## 2021-07-14 DIAGNOSIS — I5022 Chronic systolic (congestive) heart failure: Secondary | ICD-10-CM | POA: Diagnosis not present

## 2021-07-14 DIAGNOSIS — I43 Cardiomyopathy in diseases classified elsewhere: Secondary | ICD-10-CM | POA: Diagnosis not present

## 2021-07-14 DIAGNOSIS — Z955 Presence of coronary angioplasty implant and graft: Secondary | ICD-10-CM | POA: Insufficient documentation

## 2021-07-14 DIAGNOSIS — M5136 Other intervertebral disc degeneration, lumbar region: Secondary | ICD-10-CM | POA: Diagnosis not present

## 2021-07-14 DIAGNOSIS — I2510 Atherosclerotic heart disease of native coronary artery without angina pectoris: Secondary | ICD-10-CM | POA: Insufficient documentation

## 2021-07-14 DIAGNOSIS — Z86718 Personal history of other venous thrombosis and embolism: Secondary | ICD-10-CM | POA: Insufficient documentation

## 2021-07-14 DIAGNOSIS — Z7901 Long term (current) use of anticoagulants: Secondary | ICD-10-CM | POA: Diagnosis not present

## 2021-07-14 DIAGNOSIS — Z79899 Other long term (current) drug therapy: Secondary | ICD-10-CM | POA: Diagnosis not present

## 2021-07-14 DIAGNOSIS — Z6841 Body Mass Index (BMI) 40.0 and over, adult: Secondary | ICD-10-CM | POA: Diagnosis not present

## 2021-07-14 DIAGNOSIS — R002 Palpitations: Secondary | ICD-10-CM | POA: Diagnosis not present

## 2021-07-14 DIAGNOSIS — Z86711 Personal history of pulmonary embolism: Secondary | ICD-10-CM | POA: Insufficient documentation

## 2021-07-14 DIAGNOSIS — Z720 Tobacco use: Secondary | ICD-10-CM | POA: Diagnosis not present

## 2021-07-14 DIAGNOSIS — I25110 Atherosclerotic heart disease of native coronary artery with unstable angina pectoris: Secondary | ICD-10-CM

## 2021-07-14 DIAGNOSIS — I10 Essential (primary) hypertension: Secondary | ICD-10-CM

## 2021-07-14 DIAGNOSIS — E119 Type 2 diabetes mellitus without complications: Secondary | ICD-10-CM | POA: Insufficient documentation

## 2021-07-14 DIAGNOSIS — I5033 Acute on chronic diastolic (congestive) heart failure: Secondary | ICD-10-CM | POA: Diagnosis not present

## 2021-07-14 DIAGNOSIS — I110 Hypertensive heart disease with heart failure: Secondary | ICD-10-CM | POA: Diagnosis not present

## 2021-07-14 DIAGNOSIS — E854 Organ-limited amyloidosis: Secondary | ICD-10-CM | POA: Insufficient documentation

## 2021-07-14 LAB — CULTURE, BLOOD (ROUTINE X 2)
Culture: NO GROWTH
Culture: NO GROWTH
Special Requests: ADEQUATE
Special Requests: ADEQUATE
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
## 2021-07-14 NOTE — Patient Instructions (Signed)
Medication Changes:  None, continue current medications  Lab Work:  None  Testing/Procedures:  None  Referrals:  None  Special Instructions // Education:  Do the following things EVERYDAY: Weigh yourself in the morning before breakfast. Write it down and keep it in a log. Take your medicines as prescribed Eat low salt foods--Limit salt (sodium) to 2000 mg per day.  Stay as active as you can everyday Limit all fluids for the day to less than 2 liters   Follow-Up in: 3-4 months  At the [facility], you and your health needs are our priority. We have a designated team specialized in the treatment of Heart Failure. This Care Team includes your primary Heart Failure Specialized Cardiologist (physician), Advanced Practice Providers (APPs- Physician Assistants and Nurse Practitioners), and Pharmacist who all work together to provide you with the care you need, when you need it.   You may see any of the following providers on your designated Care Team at your next follow up:  Dr Glori Bickers Dr Haynes Kerns, NP Lyda Jester, [place] [facility] [place], [place] Audry Riles, PharmD   Please be sure to bring in all your medications bottles to every appointment.   Need to Contact us:  If you have any questions or concerns before your next appointment please send [place] a message through [place] or call our office at [phone].    TO LEAVE A MESSAGE FOR THE NURSE SELECT OPTION 2, PLEASE LEAVE A MESSAGE INCLUDING: YOUR NAME DATE OF BIRTH CALL BACK NUMBER REASON FOR CALL**this is important as we prioritize the call backs  YOU WILL RECEIVE A CALL BACK THE SAME DAY AS LONG AS YOU CALL BEFORE 4:00 PM

## 2021-07-15 ENCOUNTER — Encounter (HOSPITAL_COMMUNITY): Payer: Medicaid Other | Admitting: Internal Medicine

## 2021-07-15 ENCOUNTER — Other Ambulatory Visit: Payer: Self-pay | Admitting: *Deleted

## 2021-07-15 DIAGNOSIS — M5136 Other intervertebral disc degeneration, lumbar region: Secondary | ICD-10-CM | POA: Diagnosis not present

## 2021-07-15 NOTE — Patient Instructions (Signed)
Visit Information  Russell Collier was given information about Medicaid Managed Care team care coordination services as a part of their [place] benefit. Russell Collier verbally consented to engagement with the [facility] Managed Care team.   If you are experiencing a medical emergency, please call 911 or report to your local emergency department or urgent care.   If you have a non-emergency medical problem during routine business hours, please contact your provider's office and ask to speak with a nurse.   For questions related to your [facility], please call: [phone] or visit the homepage here: [email or web address]  If you would like to schedule transportation through your [facility], please call the following number at least 2 days in advance of your appointment: [phone].  Rides for urgent appointments can also be made after hours by calling Member Services.  Call the [place] at [phone], at any time, 24 hours a day, 7 days a week. If you are in danger or need immediate medical attention call 911.  If you would like help to quit smoking, call 1-800-QUIT-NOW 416-569-1697) OR Espaol: 1-855-Djelo-Ya (4-132-440-1027) o para ms informacin haga clic aqu or Text READY to 200-400 to register via text  Russell Collier,   Please see education materials related to cellulitis provided as print materials.   The patient verbalized understanding of instructions, educational materials, and care plan provided today and agreed to receive a mailed copy of patient instructions, educational materials, and care plan.   Telephone follow up appointment with Managed Medicaid care management team member scheduled for:08/17/21 @ 1:15pm  Russell Joiner RN, [place] RN Care Coordinator   Following is a copy of your  plan of care:  Care Plan : RN Care Manager Plan of Care  Updates made by Russell Montane, RN since 07/15/2021 12:00 AM     Problem: Health Management Needs Related to Pain      Long-Range Goal: Development of Plan of Care to address health management needs related to pain   Start Date: 02/05/2021  Expected End Date: 06/19/2021  Priority: High  Note:   Current Barriers:  Knowledge Deficits related to plan of care for management of new right foot pain and numbness Russell Collier had recent admission for cellulitis. He reports having all medications and taking as directed. PCP follow up scheduled for 07/17/21, transportation has been arranged. Russell Collier denies any needs at this time.  RNCM Clinical Goal(s):  Patient will verbalize understanding of plan for management of pain as evidenced by patient verbalization of self monitoring activities take all medications exactly as prescribed and will call provider for medication related questions as evidenced by documentation in EMR    attend all scheduled medical appointments: PT as listed on AVS, Echocardiogram and HF Clinic on 05/21/21 and Dr. Claudia Desanctis on 05/28/21 as evidenced by provider documentation in EMR        through collaboration with RN Care manager, provider, and care team.  Interventions: Inter-disciplinary care team collaboration (see longitudinal plan of care) Evaluation of current treatment plan related to  self management and patient's adherence to plan as established by provider Reviewed upcoming appointments-Will mail AVS for patient to have printed copy of upcoming appointments   Pain:  (Status: Condition stable. Not addressed this visit.) Long Term Goal  Pain assessment performed Medications reviewed-patient reports having and taking all medications as directed Discussed importance of adherence to all scheduled  medical appointments; Counseled on the importance of reporting any/all new or changed pain symptoms or management strategies to  pain management provider; Advised patient to report to care team affect of pain on daily activities; Discussed use of relaxation techniques and/or diversional activities to assist with pain reduction (distraction, imagery, relaxation, massage, acupressure, TENS, heat, and cold application; Assessed social determinant of health barriers;    Patient Goals/Self-Care Activities: Take medications as prescribed   Attend all scheduled provider appointments Call pharmacy for medication refills 3-7 days in advance of running out of medications Call provider office for new concerns or questions

## 2021-07-15 NOTE — Patient Outreach (Signed)
Medicaid Managed Care   Nurse Care Manager Note  07/15/2021 Name:  Russell Collier MRN:  [id number] DOB:  04/17/1960  Russell Collier is an 61 y.o. year old male who is a primary patient of Russell Shave, MD.  [facility] Managed Care Coordination team was consulted for assistance with:    pain  Russell Collier was given information about Medicaid Managed Care Coordination team services today. Russell Collier Patient agreed to services and verbal consent obtained.  Engaged with patient by telephone for follow up visit in response to provider referral for case management and/or care coordination services.   Assessments/Interventions:  Review of past medical history, allergies, medications, health status, including review of consultants reports, laboratory and other test data, was performed as part of comprehensive evaluation and provision of chronic care management services.  SDOH (Social Determinants of Health) assessments and interventions performed: SDOH Interventions    Flowsheet Row Most Recent Value  SDOH Interventions   Transportation Interventions Intervention Not Indicated  [Utilizing [facility] medical transportation]       Care Plan  Allergies  Allergen Reactions   Varenicline Anaphylaxis, Swelling and Other (See Comments)    Patient reports laryngospasm stopped in the ER, throat swelling   Bupropion Other (See Comments)    Headache - moderate/severe - self discontinued agent    Nicoderm [Nicotine] Rash    Medications Reviewed Today     Reviewed by Russell Montane, RN (Registered Nurse) on 07/15/21 at 1237  Med List Status: <None>   Medication Order Taking? Sig Documenting Provider Last Dose Status Informant  Accu-Chek Softclix Lancets lancets [id number]  Use as instructed Russell Shave, MD  Active Self  acetaminophen (TYLENOL) 325 MG tablet [id number] Yes Take 2 tablets (650 mg total) by mouth every 6 (six) hours as needed for mild pain or moderate pain. Russell Brill, DO  Taking Active   albuterol (PROVENTIL) (2.5 MG/3ML) 0.083% nebulizer solution [id number] Yes Take 3 mLs (2.5 mg total) by nebulization every 6 (six) hours as needed for wheezing or shortness of breath. Russell Shave, MD Taking Active Self  albuterol (VENTOLIN HFA) 108 (90 Base) MCG/ACT inhaler [id number] Yes Inhale 2 puffs into the lungs every 6 (six) hours as needed for wheezing or shortness of breath. Russell Shave, MD Taking Active Self  ANORO ELLIPTA 62.5-25 MCG/ACT AEPB [id number] Yes INHALE 1 PUFF INTO THE LUNGS DAILY AS NEEDED.  Patient taking differently: Inhale 1 puff into the lungs daily as needed (for "flares").   Russell Shave, MD Taking Active Self  apixaban (ELIQUIS) 2.5 MG TABS tablet [id number] Yes Take 1 tablet (2.5 mg total) by mouth 2 (two) times daily. Russell Shave, MD Taking Active Self  atorvastatin (LIPITOR) 80 MG tablet [id number] Yes Take 1 tablet (80 mg total) by mouth daily. Russell Shave, MD Taking Active Self  Blood Glucose Monitoring Suppl (ACCU-CHEK GUIDE) w/Device KIT [id number] Yes 1 Units by Does not apply route daily. Russell Shave, MD Taking Active Self  doxycycline (VIBRA-TABS) 100 MG tablet [id number] Yes Take 1 tablet (100 mg total) by mouth every 12 (twelve) hours. Russell Brill, DO Taking Active   enalapril (VASOTEC) 20 MG tablet [id number] Yes Take 1 tablet (20 mg total) by mouth at bedtime. Russell Shave, MD Taking Active Self  FARXIGA 10 MG TABS tablet [id number] Yes TAKE 1 TABLET (10 MG TOTAL) BY MOUTH DAILY BEFORE BREAKFAST. Russell Shave, MD Taking Active Self  fluticasone [facility]) 50 MCG/ACT nasal spray [id number] Yes Place 1 spray into both nostrils daily.  Patient taking  differently: Place 1 spray into both nostrils daily as needed for allergies.   Russell Shave, MD Taking Active Self  furosemide (LASIX) 40 MG tablet [id number] Yes Take 1.5 tablets (60 mg total) by mouth 2 (two) times daily. Russell Jester M, PA-C Taking Active  Self  glucose blood (ACCU-CHEK GUIDE) test strip [id number] Yes Use as instructed Russell Shave, MD Taking Active Self  isosorbide mononitrate (IMDUR) 30 MG 24 hr tablet [id number] Yes Take 1 tablet (30 mg total) by mouth daily. Russell Shave, MD Taking Active Self  multivitamin (ONE-A-DAY MEN'S) TABS tablet [id number] Yes Take 1 tablet by mouth daily with breakfast. [provider] Taking Active Self  pantoprazole (PROTONIX) 40 MG tablet [id number] Yes TAKE 1 TABLET (40 MG TOTAL) BY MOUTH DAILY. Russell Shave, MD Taking Active Self  potassium chloride SA (KLOR-CON Col[place]) 20 MEQ tablet [id number] Yes Take 2 tablets (40 mEq total) by mouth daily. Russell Pandy, PA-C Taking Active Self           Med Note Domingo Sep   Tue Jul 14, 2021  1:44 PM)    pregabalin (LYRICA) 100 MG capsule [id number] Yes TAKE ONE CAPSULE BY MOUTH THREE TIMES DAILY Russell Shave, MD Taking Active Self  Tafamidis (VYNDAMAX) 61 MG CAPS [id number] Yes TAKE 1 CAPSULE BY MOUTH DAILY. [name], [name], MD Taking Active Self  triamcinolone ointment (KENALOG) 0.1 % [id number] Yes Apply 1 application topically 2 (two) times daily.  Patient taking differently: Apply 1 application. topically 2 (two) times daily as needed (for itching).   Alcus Dad, MD Taking Active Self  valACYclovir (VALTREX) 500 MG tablet [id number] Yes Take 1 tablet (500 mg total) by mouth daily. Russell Shave, MD Taking Active Self           Med Note Duffy Bruce, Sherrie Mustache Jun 28, 2021  9:45 PM) ONGOING THERAPY  Vitamin A 2400 MCG (8000 UT) TABS [id number] Yes Take 1 tablet by mouth daily.  Patient taking differently: Take 8,000 Units by mouth daily.   [name], [name], MD Taking Active Self           Med Note Duffy Bruce, Sherrie Mustache Jun 28, 2021  9:38 PM)    vutrisiran sodium (AMVUTTRA) 25 MG/0.5ML syringe [id number] Yes Inject 0.5 mLs (25 mg total) into the skin every 3 (three) months. [name], [name], MD Taking Active Self             Patient Active Problem List   Diagnosis Date Noted   Pulmonary hypertension, primary ([place])    Cellulitis 07/09/2021   Cellulitis of right lower extremity 07/04/2021   Urinary tract infection without hematuria    Sepsis ([place]) 06/28/2021   Community acquired pneumonia    Gout 02/26/2021   Skin tag 02/12/2021   Skin cyst 02/12/2021   Anticoagulant long-term use 01/29/2021   Dermoid cyst of skin of nose 12/24/2020   Strain of right calf muscle 07/01/2020   Status post lumbar spinal fusion 06/06/2020   Impaired mobility and ADLs 04/23/2020   Midline low back pain 04/23/2020   Neuropathy, 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   COPD exacerbation ([place]) 03/20/2020   Type 2 diabetes mellitus with diabetic peripheral angiopathy without gangrene, without long-term current use of insulin ([place]) 03/20/2020   Coagulation defect ([place]) 12/11/2019   Fatigue 12/10/2019   Leg cramps 12/10/2019   Frequent epistaxis 10/30/2019   Disc degeneration, 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   Ganglion of right knee 09/27/2019   Lumbar radiculopathy 09/27/2019  Lower extremity edema    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HCC)    Respiratory failure ([place]) 07/27/2019   Primary osteoarthritis of right knee 07/24/2019   Breast tenderness in male 05/31/2019   Chronic pain 05/31/2019   AKI (acute kidney injury) ([place]) 05/31/2019   Pain due to onychomycosis of toenails of both feet 05/16/2019   Chronic knee pain 04/16/2019   Diabetes ([place]) 04/16/2019   Esophageal reflux 02/06/2019   Heartburn 02/06/2019   Chronic skin ulcer of lower leg ([place]) 12/21/2018   S/P drug eluting coronary stent placement    Coronary artery disease involving native coronary artery of native heart with unstable angina pectoris ([place]) 12/20/2018   Unstable angina (HCC)    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   Acute on chronic diastolic heart failure (HCC)    Shortness of breath    Precordial chest pain    Idiopathic peripheral neuropathy    Palpitations    Chronic  diastolic heart failure (HCC)    Abnormal ankle brachial index (ABI)    Chest pain, rule out acute myocardial infarction 09/26/2018   History of cocaine abuse ([place]) 08/20/2018   Marijuana use 08/20/2018   Morbid obesity with BMI of 40.0-44.9, adult ([place]) 08/20/2018   Dyspepsia    Morbid obesity (HCC)    OSA on CPAP    Chest pain 05/30/2018   CAD (coronary artery disease) 03/30/2018   Elevated troponin 02/03/2018   Positive urine drug screen 02/03/2018   Tobacco abuse 02/03/2018   Hyperlipidemia 02/03/2018   Acute respiratory failure with hypoxia ([place]) 02/02/2018   Chronic congestive heart failure (HCC)    Keratoconjunctivitis sicca of left eye not specified as Sjogren's 09/06/2017   Long term current use of oral hypoglycemic drug 09/06/2017   Mechanical ectropion of left lower eyelid 09/06/2017   Nuclear sclerotic cataract of both eyes 09/06/2017   Refractive amblyopia, left 09/06/2017   Type 2 diabetes mellitus without complication, without long-term current use of insulin ([place]) 09/06/2017   Hyperopia of both eyes with astigmatism and presbyopia 09/06/2017   Atypical chest pain 12/27/2015   Essential hypertension    Neuropathy     Conditions to be addressed/monitored per PCP order:   recent hospitalization  Care Plan : [place] of Care  Updates made by Russell Montane, RN since 07/15/2021 12:00 AM     Problem: Health Management Needs Related to Pain      Long-Range Goal: Development of Plan of Care to address health management needs related to pain   Start Date: 02/05/2021  Expected End Date: 06/19/2021  Priority: High  Note:   Current Barriers:  Knowledge Deficits related to plan of care for management of new right foot pain and numbness Mr. Sinclair had recent admission for cellulitis. He reports having all medications and taking as directed. PCP follow up scheduled for 07/17/21, transportation has been arranged. Mr. Edgerly denies any needs at this time.  RNCM  Clinical Goal(s):  Patient will verbalize understanding of plan for management of pain as evidenced by patient verbalization of self monitoring activities take all medications exactly as prescribed and will call provider for medication related questions as evidenced by documentation in EMR    attend all scheduled medical appointments: PT as listed on AVS, Echocardiogram and [facility] on 05/21/21 and Dr. Claudia Desanctis on 05/28/21 as evidenced by provider documentation in EMR        through collaboration with RN Care manager, provider, and care team.  Interventions: Inter-disciplinary care team collaboration (see longitudinal  plan of care) Evaluation of current treatment plan related to  self management and patient's adherence to plan as established by provider Reviewed upcoming appointments-Will mail AVS for patient to have printed copy of upcoming appointments   Pain:  (Status: Condition stable. Not addressed this visit.) Long Term Goal  Pain assessment performed Medications reviewed-patient reports having and taking all medications as directed Discussed importance of adherence to all scheduled medical appointments; Counseled on the importance of reporting any/all new or changed pain symptoms or management strategies to pain management provider; Advised patient to report to care team affect of pain on daily activities; Discussed use of relaxation techniques and/or diversional activities to assist with pain reduction (distraction, imagery, relaxation, massage, acupressure, TENS, heat, and cold application; Assessed social determinant of health barriers;    Patient Goals/Self-Care Activities: Take medications as prescribed   Attend all scheduled provider appointments Call pharmacy for medication refills 3-7 days in advance of running out of medications Call provider office for new concerns or questions        Follow Up:  Patient agrees to Care Plan and Follow-up.  Plan: The Managed Medicaid care  management team will reach out to the patient again over the next 30 days.  Date/time of next scheduled RN care management/care coordination outreach:  08/17/21 @ 1:15pm  [name] RN, [place] RN Care Coordinator

## 2021-07-16 ENCOUNTER — Ambulatory Visit: Payer: Medicaid Other | Admitting: Surgical

## 2021-07-16 DIAGNOSIS — M5136 Other intervertebral disc degeneration, lumbar region: Secondary | ICD-10-CM | POA: Diagnosis not present

## 2021-07-16 DIAGNOSIS — L720 Epidermal cyst: Secondary | ICD-10-CM

## 2021-07-16 DIAGNOSIS — L723 Sebaceous cyst: Secondary | ICD-10-CM

## 2021-07-16 NOTE — Progress Notes (Signed)
61 year old male here for follow-up after excision of left nasal skin lesion with Dr. Claudia Desanctis on 07/02/2021.  He is 2 weeks postop.  Of note the incision was closed with 5-0 Monocryl.  Surgical pathology showed epidermal inclusion cyst.  On exam left nasal incision is intact, well-healed, sutures have dissolved.  There is no surrounding erythema or cellulitic changes.  Recommend following up as needed, recommend sunscreen to the surgical site to prevent discoloration from sun.

## 2021-07-17 ENCOUNTER — Ambulatory Visit (INDEPENDENT_AMBULATORY_CARE_PROVIDER_SITE_OTHER): Payer: Medicaid Other | Admitting: Family Medicine

## 2021-07-17 ENCOUNTER — Ambulatory Visit (HOSPITAL_BASED_OUTPATIENT_CLINIC_OR_DEPARTMENT_OTHER): Payer: Medicaid Other | Admitting: Physical Therapy

## 2021-07-17 ENCOUNTER — Encounter: Payer: Self-pay | Admitting: Family Medicine

## 2021-07-17 VITALS — BP 113/66 | HR 82 | Temp 97.9°F | Ht 70.0 in | Wt 315.0 lb

## 2021-07-17 DIAGNOSIS — E1151 Type 2 diabetes mellitus with diabetic peripheral angiopathy without gangrene: Secondary | ICD-10-CM

## 2021-07-17 DIAGNOSIS — L03115 Cellulitis of right lower limb: Secondary | ICD-10-CM | POA: Diagnosis not present

## 2021-07-17 DIAGNOSIS — N179 Acute kidney failure, unspecified: Secondary | ICD-10-CM | POA: Diagnosis not present

## 2021-07-17 DIAGNOSIS — M5136 Other intervertebral disc degeneration, lumbar region: Secondary | ICD-10-CM | POA: Diagnosis not present

## 2021-07-17 NOTE — Patient Instructions (Signed)
It was great seeing you today!  Your legs look swollen but the infection seems to have improved.  We are wrapping her foot and Unna boots which he will keep on for 1 week.  I would like to see you in 1 week to reevaluate your leg.  If you have any fevers between now and then please be seen at the hospital.  We are checking your kidney function and I will talk to you about the results at your next visit.  I hope you have a wonderful day!

## 2021-07-17 NOTE — Progress Notes (Unsigned)
    SUBJECTIVE:   CHIEF COMPLAINT / HPI:   Hospital follow-up Patient reports for hospital follow-up after being admitted for right leg cellulitis.  Reports that he has completed his antibiotic regimen and the infection has not gotten worse.  He does report considerable swelling in his lower extremities and has not been using any wrapping or covering for the right lower extremity where the infection was.  Denies any warmth, fevers, chills.  Patient understands that he should not be taking NSAIDs because of kidney concerns but he did take ibuprofen to help with the pain.  OBJECTIVE:   BP 113/66   Pulse 82   Temp 97.9 F (36.6 C)   Ht '5\' 10"'$  (1.778 m)   Wt (!) 315 lb (142.9 kg)   SpO2 99%   BMI 45.20 kg/m   General: Pleasant 61 year old male in no acute distress Cardiac: Regular rate and rhythm Respiratory: Normal work of breathing, speaking in full sentences MSK: Swelling noted to right lower extremity with pitting edema to the knee.  Peeling skin.  Decreased erythema from previous evaluations.  No excessive warmth to the lower extremity.  ASSESSMENT/PLAN:   AKI (acute kidney injury) ([place]) Discussed at length avoidance of nephrotoxic agents.  We will check BMP today to ensure creatinine improved from hospitalization.  Cellulitis of right lower extremity Cellulitis improving from previous evaluations.  No signs of systemic illness.  Feel that the swelling may be related to venous insufficiency in the right lower extremity.  Unna boots placed to the right lower extremity and follow-up in 1 week.  Strict ED precautions given.  Patient is agreeable to this.     [name], MD [place]

## 2021-07-18 LAB — BASIC METABOLIC PANEL
BUN/Creatinine Ratio: 22 (ref 10–24)
BUN: 28 mg/dL — ABNORMAL HIGH (ref 8–27)
CO2: 21 mmol/L (ref 20–29)
Calcium: 9.4 mg/dL (ref 8.6–10.2)
Chloride: 105 mmol/L (ref 96–106)
Creatinine, Ser: 1.27 mg/dL (ref 0.76–1.27)
Glucose: 110 mg/dL — ABNORMAL HIGH (ref 70–99)
Potassium: 4.5 mmol/L (ref 3.5–5.2)
Sodium: 142 mmol/L (ref 134–144)
eGFR: 63 mL/min/{1.73_m2} (ref >59)

## 2021-07-18 NOTE — Assessment & Plan Note (Signed)
Cellulitis improving from previous evaluations.  No signs of systemic illness.  Feel that the swelling may be related to venous insufficiency in the right lower extremity.  Unna boots placed to the right lower extremity and follow-up in 1 week.  Strict ED precautions given.  Patient is agreeable to this.

## 2021-07-18 NOTE — Assessment & Plan Note (Signed)
Discussed at length avoidance of nephrotoxic agents.  We will check BMP today to ensure creatinine improved from hospitalization.

## 2021-07-19 ENCOUNTER — Emergency Department (HOSPITAL_BASED_OUTPATIENT_CLINIC_OR_DEPARTMENT_OTHER): Payer: Medicaid Other

## 2021-07-19 ENCOUNTER — Encounter (HOSPITAL_COMMUNITY): Payer: Self-pay | Admitting: Emergency Medicine

## 2021-07-19 ENCOUNTER — Emergency Department (HOSPITAL_COMMUNITY)
Admission: EM | Admit: 2021-07-19 | Discharge: 2021-07-20 | Disposition: A | Discharge status: Home / Self Care | Payer: Medicaid Other | Attending: Emergency Medicine | Admitting: Emergency Medicine

## 2021-07-19 ENCOUNTER — Emergency Department (HOSPITAL_COMMUNITY): Payer: Medicaid Other

## 2021-07-19 DIAGNOSIS — M79604 Pain in right leg: Secondary | ICD-10-CM | POA: Diagnosis not present

## 2021-07-19 DIAGNOSIS — R1031 Right lower quadrant pain: Secondary | ICD-10-CM | POA: Diagnosis not present

## 2021-07-19 DIAGNOSIS — M7989 Other specified soft tissue disorders: Secondary | ICD-10-CM | POA: Diagnosis not present

## 2021-07-19 DIAGNOSIS — I513 Intracardiac thrombosis, not elsewhere classified: Secondary | ICD-10-CM | POA: Diagnosis not present

## 2021-07-19 DIAGNOSIS — L0390 Cellulitis, unspecified: Secondary | ICD-10-CM

## 2021-07-19 DIAGNOSIS — R2241 Localized swelling, mass and lump, right lower limb: Secondary | ICD-10-CM | POA: Diagnosis not present

## 2021-07-19 DIAGNOSIS — G35 Multiple sclerosis: Secondary | ICD-10-CM | POA: Diagnosis not present

## 2021-07-19 DIAGNOSIS — Z7901 Long term (current) use of anticoagulants: Secondary | ICD-10-CM | POA: Diagnosis not present

## 2021-07-19 DIAGNOSIS — L538 Other specified erythematous conditions: Secondary | ICD-10-CM

## 2021-07-19 DIAGNOSIS — M79661 Pain in right lower leg: Secondary | ICD-10-CM | POA: Diagnosis present

## 2021-07-19 DIAGNOSIS — I745 Embolism and thrombosis of iliac artery: Secondary | ICD-10-CM | POA: Diagnosis not present

## 2021-07-19 DIAGNOSIS — I743 Embolism and thrombosis of arteries of the lower extremities: Secondary | ICD-10-CM | POA: Diagnosis not present

## 2021-07-19 DIAGNOSIS — M79605 Pain in left leg: Secondary | ICD-10-CM

## 2021-07-19 DIAGNOSIS — I739 Peripheral vascular disease, unspecified: Secondary | ICD-10-CM

## 2021-07-19 LAB — CBC WITH DIFFERENTIAL/PLATELET
Abs Immature Granulocytes: 0.03 10*3/uL (ref 0.00–0.07)
Basophils Absolute: 0 10*3/uL (ref 0.0–0.1)
Basophils Relative: 0 %
Eosinophils Absolute: 0.2 10*3/uL (ref 0.0–0.5)
Eosinophils Relative: 3 %
HCT: 39 % (ref 39.0–52.0)
Hemoglobin: 13 g/dL (ref 13.0–17.0)
Immature Granulocytes: 0 %
Lymphocytes Relative: 26 %
Lymphs Abs: 2.3 10*3/uL (ref 0.7–4.0)
MCH: 30.2 pg (ref 26.0–34.0)
MCHC: 33.3 g/dL (ref 30.0–36.0)
MCV: 90.7 fL (ref 80.0–100.0)
Monocytes Absolute: 1.1 10*3/uL — ABNORMAL HIGH (ref 0.1–1.0)
Monocytes Relative: 12 %
Neutro Abs: 5 10*3/uL (ref 1.7–7.7)
Neutrophils Relative %: 59 %
Platelets: 231 10*3/uL (ref 150–400)
RBC: 4.3 MIL/uL (ref 4.22–5.81)
RDW: 16.2 % — ABNORMAL HIGH (ref 11.5–15.5)
WBC: 8.6 10*3/uL (ref 4.0–10.5)
nRBC: 0 % (ref 0.0–0.2)

## 2021-07-19 LAB — BASIC METABOLIC PANEL
Anion gap: 7 (ref 5–15)
BUN: 22 mg/dL (ref 8–23)
CO2: 26 mmol/L (ref 22–32)
Calcium: 9 mg/dL (ref 8.9–10.3)
Chloride: 109 mmol/L (ref 98–111)
Creatinine, Ser: 1.49 mg/dL — ABNORMAL HIGH (ref 0.61–1.24)
GFR, Estimated: 52 mL/min — ABNORMAL LOW (ref >60)
Glucose, Bld: 92 mg/dL (ref 70–99)
Potassium: 3.8 mmol/L (ref 3.5–5.1)
Sodium: 142 mmol/L (ref 135–145)

## 2021-07-19 MED ORDER — IOHEXOL 350 MG/ML SOLN
100.0000 mL | Freq: Once | INTRAVENOUS | Status: AC | PRN
  Administered 2021-07-19: 100 mL via INTRAVENOUS

## 2021-07-19 MED ORDER — LACTATED RINGERS IV BOLUS: 500.0000 mL | Freq: Once | INTRAVENOUS | Status: DC

## 2021-07-19 MED ORDER — MORPHINE SULFATE (PF) 4 MG/ML IV SOLN
4.0000 mg | Freq: Once | INTRAVENOUS | Status: DC
  Filled 2021-07-19: qty 1

## 2021-07-19 MED ORDER — HYDROCODONE-ACETAMINOPHEN 5-325 MG PO TABS
1.0000 | ORAL_TABLET | Freq: Once | ORAL | Status: AC
  Administered 2021-07-19: 1 via ORAL
  Filled 2021-07-19: qty 1

## 2021-07-19 NOTE — Progress Notes (Signed)
VASCULAR LAB    Right lower extremity venous duplex has been performed.  See CV proc for preliminary results.   Rose Hegner, RVT 07/19/2021, 6:16 PM

## 2021-07-19 NOTE — ED Provider Notes (Signed)
[name] EMERGENCY DEPARTMENT Provider Note   CSN: [id number] Arrival date & time: 07/19/21  1542     History  No chief complaint on file.   Russell Collier is a 61 y.o. male.  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  Patient was recently admitted for right lower extremity cellulitis that failed outpatient oral antibiotics, improved with IV antibiotics.  He was recently discharged and completed an outpatient regimen of doxycycline.  He states over the last couple days he has noticed ongoing swelling and mild redness of the calf and dorsal aspect of the foot with worsening pain.  Denies any fever or chills.  No chest pain or shortness of breath.  Home Medications Prior to Admission medications   Medication Sig Start Date End Date Taking? Authorizing Provider  Accu-Chek Softclix Lancets lancets Use as instructed 11/20/20   [name], MD  acetaminophen (TYLENOL) 325 MG tablet Take 2 tablets (650 mg total) by mouth every 6 (six) hours as needed for mild pain or moderate pain. 07/13/21   Dameron, Luna Fuse, DO  albuterol (PROVENTIL) (2.5 MG/3ML) 0.083% nebulizer solution Take 3 mLs (2.5 mg total) by nebulization every 6 (six) hours as needed for wheezing or shortness of breath. 11/20/20   [name], MD  albuterol (VENTOLIN HFA) 108 (90 Base) MCG/ACT inhaler Inhale 2 puffs into the lungs every 6 (six) hours as needed for wheezing or shortness of breath. 11/20/20   [name], MD  ANORO ELLIPTA 62.5-25 MCG/ACT AEPB INHALE 1 PUFF INTO THE LUNGS DAILY AS NEEDED. Patient taking differently: Inhale 1 puff into the lungs daily as needed (for "flares"). 05/19/21   [name], MD  apixaban (ELIQUIS) 2.5 MG TABS tablet Take 1 tablet (2.5 mg total) by mouth 2 (two) times daily. 11/20/20   [name], MD   atorvastatin (LIPITOR) 80 MG tablet Take 1 tablet (80 mg total) by mouth daily. 11/20/20   [name], MD  Blood Glucose Monitoring Suppl (ACCU-CHEK GUIDE) w/Device KIT 1 Units by Does not apply route daily. 11/20/20   [name], MD  doxycycline (VIBRA-TABS) 100 MG tablet Take 1 tablet (100 mg total) by mouth every 12 (twelve) hours. 07/13/21   Dameron, Luna Fuse, DO  enalapril (VASOTEC) 20 MG tablet Take 1 tablet (20 mg total) by mouth at bedtime. 11/20/20   [name], MD  FARXIGA 10 MG TABS tablet TAKE 1 TABLET (10 MG TOTAL) BY MOUTH DAILY BEFORE BREAKFAST. 06/16/21   [name], MD  fluticasone (FLONASE) 50 MCG/ACT nasal spray Place 1 spray into both nostrils daily. Patient taking differently: Place 1 spray into both nostrils daily as needed for allergies. 11/20/20   [name], MD  furosemide (LASIX) 40 MG tablet Take 1.5 tablets (60 mg total) by mouth 2 (two) times daily. 05/21/21   Lyda Jester M, PA-C  glucose blood (ACCU-CHEK GUIDE) test strip Use as instructed 11/20/20   [name], MD  isosorbide mononitrate (IMDUR) 30 MG 24 hr tablet Take 1 tablet (30 mg total) by mouth daily. 11/20/20   [name], MD  multivitamin (ONE-A-DAY MEN'S) TABS tablet Take 1 tablet by mouth daily with breakfast.    [provider]  pantoprazole (PROTONIX) 40 MG tablet TAKE 1 TABLET (40 MG TOTAL) BY MOUTH DAILY. 05/19/21   [name], MD  potassium chloride SA (KLOR-CON M) 20 MEQ tablet Take 2 tablets (40 mEq  total) by mouth daily. 05/21/21   Lyda Jester M, PA-C  pregabalin (LYRICA) 100 MG capsule TAKE ONE CAPSULE BY MOUTH THREE TIMES DAILY 04/23/21   [name], MD  Tafamidis (VYNDAMAX) 61 MG CAPS TAKE 1 CAPSULE BY MOUTH DAILY. 12/01/20 12/01/21  [name], [name], MD  triamcinolone ointment (KENALOG) 0.1 % Apply 1 application topically 2 (two) times daily. Patient taking differently: Apply 1 application. topically 2 (two) times daily as needed  (for itching). 03/12/21   Alcus Dad, MD  valACYclovir (VALTREX) 500 MG tablet Take 1 tablet (500 mg total) by mouth daily. 11/20/20   [name], MD  Vitamin A 2400 MCG (8000 UT) TABS Take 1 tablet by mouth daily. Patient taking differently: Take 8,000 Units by mouth daily. 05/21/21   [name], [name], MD  vutrisiran sodium (AMVUTTRA) 25 MG/0.5ML syringe Inject 0.5 mLs (25 mg total) into the skin every 3 (three) months. 05/01/21   [name], [name], MD      Allergies    Varenicline, Bupropion, and Nicoderm [nicotine]    Review of Systems   Review of Systems  Constitutional:  Positive for chills and fatigue. Negative for fever.  Respiratory:  Negative for shortness of breath.   Cardiovascular:  Negative for chest pain.  Gastrointestinal:  Negative for abdominal pain, diarrhea and vomiting.  Musculoskeletal:        Right foot and lower leg redness and pain  Skin:  Negative for rash.  Neurological:  Negative for headaches.   Physical Exam Updated Vital Signs BP (!) 102/48   Pulse 83   Temp 98.9 F (37.2 C) (Oral)   Resp 16   Ht 5' 10"  (1.778 m)   Wt (!) 142.9 kg   SpO2 97%   BMI 45.20 kg/m  Physical Exam Vitals and nursing note reviewed.  Constitutional:      Appearance: Normal appearance.  HENT:     Head: Normocephalic.     Mouth/Throat:     Mouth: Mucous membranes are moist.  Cardiovascular:     Rate and Rhythm: Normal rate.  Pulmonary:     Effort: Pulmonary effort is normal. No respiratory distress.  Abdominal:     Palpations: Abdomen is soft.     Tenderness: There is no abdominal tenderness.  Musculoskeletal:     Comments: Mild right foot and calf edema with slight erythema when compared to the left side, no open wounds or drainage  Skin:    General: Skin is warm.  Neurological:     Mental Status: He is alert and oriented to person, place, and time. Mental status is at baseline.  Psychiatric:        Mood and Affect: Mood normal.    ED Results /  Procedures / Treatments   Labs (all labs ordered are listed, but only abnormal results are displayed) Labs Reviewed  CBC WITH DIFFERENTIAL/PLATELET  BASIC METABOLIC PANEL    EKG None  Radiology No results found.  Procedures Procedures    Medications Ordered in ED Medications - No data to display  ED Course/ Medical Decision Making/ A&P                           Medical Decision Making Amount and/or Complexity of Data Reviewed Labs: ordered.  Risk Prescription drug management.   61 year-old male presents emergency department with ongoing right lower extremity swelling, redness and worsening pain radiating up into his groin.  He was recently admitted for cellulitis.  He had initially failed p.o. antibiotics, improved on IV vancomycin, was discharged on oral doxycycline of which she completed.  He comes back today with worsening "pain episodes" that originates in the right calf and goes up to the right groin.  On physical exam the right lower extremity from the calf down is edematous, erythematous compared to the left.  No warmth.  There is dopplerable DP pulses but slightly diminished on the right.  He is afebrile, blood work is reassuring, no leukocytosis.  Given his ongoing symptoms and worsening pain with decreased DP pulse on the right plan for CTA of the lower extremity.  Patient signed out pending this study.        Final Clinical Impression(s) / ED Diagnoses Final diagnoses:  None    Rx / DC Orders ED Discharge Orders     None         Lorelle Gibbs, DO 07/19/21 2156

## 2021-07-19 NOTE — ED Triage Notes (Signed)
Pt arrives via EMS from home with cellulitis to right leg. Pt recently admitted for same and sent home from hospital on doxycyline. Pain 8/10 in right leg. Swelling noted with minor redness.

## 2021-07-19 NOTE — Progress Notes (Signed)
VASCULAR LAB    Right lower extremity venous duplex has been performed.  See CV proc for preliminary results.  Gave verbal results to Dr. Bunnie Pion, Avalyn Molino, RVT 07/19/2021, 6:40 PM

## 2021-07-19 NOTE — ED Notes (Signed)
IV Attempt successful

## 2021-07-19 NOTE — ED Provider Notes (Signed)
I assumed care of patient at shift change from previous team, plan to follow up on imaging, if normal can discharge home with pain meds.   At the time of shift change patient CT read is still pending.  At shift change care was transferred to [facility] who will follow pending studies, re-evaulate and determine disposition.       Lorin Glass, PA-C 07/21/21 Ezequiel Ganser    Sherwood Gambler, MD 07/21/21 [phone]

## 2021-07-19 NOTE — ED Provider Notes (Signed)
Care assumed from previous provider PA [facility]  She received the patient in handoff from Dr. Maryan Rued.  Please see her note for original HPI.  In short patient is a 61 year old male presenting today with worsening right lower extremity pain.  Was discharged from the hospital after being admitted for cellulitis of this extremity on 5/22.  CTA right lower extremity pending at this time.  If patient is with minimal stenosis he will be discharged for outpatient follow-up since he does have dopplerable pulses.  If any severe stenosis, vascular surgery will be consulted. If no bad stenosis, otherwise consult vasc  Physical Exam  BP 106/64 (BP Location: Right Arm)   Pulse 78   Temp 98.9 F (37.2 C) (Oral)   Resp 16   Ht '5\' 10"'$  (1.778 m)   Wt (!) 142.9 kg   SpO2 99%   BMI 45.20 kg/m   Physical Exam Vitals and nursing note reviewed.  Constitutional:      Appearance: Normal appearance.  HENT:     Head: Normocephalic and atraumatic.  Eyes:     General: No scleral icterus.    Conjunctiva/sclera: Conjunctivae normal.  Pulmonary:     Effort: Pulmonary effort is normal. No respiratory distress.  Musculoskeletal:        General: Tenderness present.     Comments: Tenderness and inflammation to the right posterior lower extremity below the knee.  No cellulitic skin  Skin:    Findings: No rash.  Neurological:     Mental Status: He is alert.  Psychiatric:        Mood and Affect: Mood normal.    Procedures  Procedures  ED Course / MDM    Medical Decision Making Amount and/or Complexity of Data Reviewed Labs: ordered. Radiology: ordered.  Risk Prescription drug management.   I individually viewed and interpreted patient's CT angio of the right lower extremity.  Read as below, I agree with radiology:  IMPRESSION: 1. Atherosclerotic calcification with likely high-grade stenosis or complete occlusion involving the anterior tibial artery on the right with reconstitution at the level  of the ankle. The dorsalis pedis appears patent. 2. Multifocal high-grade stenosis involving the distal popliteal artery, tibioperoneal trunk and proximal posterior tibial artery. 3. Degenerative changes at the right knee with small joint effusion. 4. 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 Correlation for cancer history and PSA level is suggested to exclude the possibility metastasis.    Vascular surgery has been paged.   I spoke with Dr. Donzetta Matters with vascular surgery who says the patient is appropriate for outpatient follow-up.  I have secure chatted the patient's information and he is agreeable to discharge at this time with follow-up with vascular surgery.    Rhae Hammock, PA-C 07/20/21 0124    Fatima Blank, MD 07/20/21 [phone]

## 2021-07-20 DIAGNOSIS — M5136 Other intervertebral disc degeneration, lumbar region: Secondary | ICD-10-CM | POA: Diagnosis not present

## 2021-07-20 NOTE — Discharge Instructions (Signed)
Please call the vascular surgeon Tuesday morning to obtain an outpatient appointment.  In the meantime, please return with any worsening symptoms.

## 2021-07-21 ENCOUNTER — Telehealth: Payer: Self-pay

## 2021-07-21 DIAGNOSIS — M5136 Other intervertebral disc degeneration, lumbar region: Secondary | ICD-10-CM | POA: Diagnosis not present

## 2021-07-21 NOTE — Telephone Encounter (Signed)
Transition Care Management Follow-up Telephone Call Date of discharge and from where: 07/20/2021-[place]  How have you been since you were released from the hospital? Pt state he is doing fine and he's happy he has some answers to what's been going on with him. Patient is very thankful for the care received.  Any questions or concerns? No  Items Reviewed: Did the pt receive and understand the discharge instructions provided? Yes  Medications obtained and verified?  No medications given at discharge Other? No  Any new allergies since your discharge? No  Dietary orders reviewed? No Do you have support at home? Yes   Home Care and Equipment/Supplies: Were home health services ordered? not applicable If so, what is the name of the agency? N/A  Has the agency set up a time to come to the patient's home? not applicable Were any new equipment or medical supplies ordered?  No What is the name of the medical supply agency? N/A Were you able to get the supplies/equipment? not applicable Do you have any questions related to the use of the equipment or supplies? No  Functional Questionnaire: (I = Independent and D = Dependent) ADLs: I  Bathing/Dressing- I  Meal Prep- I  Eating- I  Maintaining continence- I  Transferring/Ambulation- I  Managing Meds- I  Follow up appointments reviewed:  PCP Hospital f/u appt confirmed? No  . [facility] f/u appt confirmed? Yes  Scheduled to see Dr. Donzetta Matters on 07/22/2021 @ 3:20pm. Are transportation arrangements needed? No  If their condition worsens, is the pt aware to call PCP or go to the Emergency Dept.? Yes Was the patient provided with contact information for the PCP's office or ED? Yes Was to pt encouraged to call back with questions or concerns? Yes

## 2021-07-22 ENCOUNTER — Ambulatory Visit (HOSPITAL_BASED_OUTPATIENT_CLINIC_OR_DEPARTMENT_OTHER): Payer: Medicaid Other | Admitting: Physical Therapy

## 2021-07-22 ENCOUNTER — Encounter: Payer: Self-pay | Admitting: Vascular Surgery

## 2021-07-22 ENCOUNTER — Ambulatory Visit: Payer: Medicaid Other | Admitting: Vascular Surgery

## 2021-07-22 ENCOUNTER — Other Ambulatory Visit: Payer: Self-pay

## 2021-07-22 VITALS — BP 125/70 | HR 68 | Temp 97.9°F | Resp 20 | Ht 70.0 in | Wt 315.0 lb

## 2021-07-22 DIAGNOSIS — M5136 Other intervertebral disc degeneration, lumbar region: Secondary | ICD-10-CM | POA: Diagnosis not present

## 2021-07-22 DIAGNOSIS — I739 Peripheral vascular disease, unspecified: Secondary | ICD-10-CM
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
## 2021-07-22 NOTE — Progress Notes (Unsigned)
Pre op instructional letter given to pt in office.
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
## 2021-07-23 ENCOUNTER — Encounter (HOSPITAL_COMMUNITY): Payer: Self-pay | Admitting: Emergency Medicine

## 2021-07-23 ENCOUNTER — Ambulatory Visit (INDEPENDENT_AMBULATORY_CARE_PROVIDER_SITE_OTHER): Payer: Medicaid Other | Admitting: Family Medicine

## 2021-07-23 ENCOUNTER — Other Ambulatory Visit: Payer: Self-pay

## 2021-07-23 ENCOUNTER — Encounter: Payer: Self-pay | Admitting: Family Medicine

## 2021-07-23 ENCOUNTER — Emergency Department (HOSPITAL_COMMUNITY)
Admission: EM | Admit: 2021-07-23 | Discharge: 2021-07-24 | Disposition: A | Discharge status: Home / Self Care | Payer: Medicaid Other | Attending: Emergency Medicine | Admitting: Emergency Medicine

## 2021-07-23 VITALS — BP 107/57 | HR 78 | Ht 70.0 in | Wt 315.2 lb

## 2021-07-23 DIAGNOSIS — I110 Hypertensive heart disease with heart failure: Secondary | ICD-10-CM | POA: Diagnosis not present

## 2021-07-23 DIAGNOSIS — M79604 Pain in right leg: Secondary | ICD-10-CM | POA: Diagnosis not present

## 2021-07-23 DIAGNOSIS — I5032 Chronic diastolic (congestive) heart failure: Secondary | ICD-10-CM | POA: Insufficient documentation

## 2021-07-23 DIAGNOSIS — R609 Edema, unspecified: Secondary | ICD-10-CM | POA: Diagnosis not present

## 2021-07-23 DIAGNOSIS — I739 Peripheral vascular disease, unspecified: Secondary | ICD-10-CM | POA: Insufficient documentation

## 2021-07-23 DIAGNOSIS — M5136 Other intervertebral disc degeneration, lumbar region: Secondary | ICD-10-CM | POA: Diagnosis not present

## 2021-07-23 DIAGNOSIS — L03115 Cellulitis of right lower limb: Secondary | ICD-10-CM | POA: Diagnosis not present

## 2021-07-23 DIAGNOSIS — Z7901 Long term (current) use of anticoagulants: Secondary | ICD-10-CM | POA: Insufficient documentation

## 2021-07-23 DIAGNOSIS — I2510 Atherosclerotic heart disease of native coronary artery without angina pectoris: Secondary | ICD-10-CM | POA: Diagnosis not present

## 2021-07-23 MED ORDER — ACCU-CHEK GUIDE W/DEVICE KIT: 1.0000 [IU] | PACK | Freq: Every day | 0 refills | Status: DC

## 2021-07-23 NOTE — Progress Notes (Unsigned)
    SUBJECTIVE:   CHIEF COMPLAINT / HPI:   Right lower extremity swelling follow-up Patient presents today for follow-up of right lower extremity swelling and erythema.  Swelling and erythema still present.  Unna boots were placed at previous visit but fell off within 1 day of being placed.  Patient was seen in the emergency department for pain and seen by vascular surgeon who wants to do an aortogram and possible procedures to help with vascularization of the lower extremity.  Patient denies any fever or chills.  Reports that there has been no worsening of symptoms but no improvement.  Reports that he is not having severe pain but he is uncomfortable at this time.  OBJECTIVE:   BP (!) 107/57   Pulse 78   Ht '5\' 10"'$  (1.778 m)   Wt (!) 315 lb 3.2 oz (143 kg)   SpO2 98%   BMI 45.23 kg/m   General: Patient is pleasant 61 year old male sitting comfortably laughing and joking Cardiac: Regular rate and rhythm Respiratory: Normal work of breathing MSK: Patient with erythema of right lower extremities stable from previous evaluation.  Edema of the right lower extremity but it is improved from previous evaluation.  Patient does have diffuse tenderness to palpation and difficulty ambulating.  ASSESSMENT/PLAN:   Cellulitis of right lower extremity Current symptoms stable from previous evaluation.  Believe that a lot of this is related to patient's peripheral arterial disease.  Patient has completed multiple rounds of antibiotics and do not feel that the swelling and erythema are related to infection at this time.  Believe that wound care may be beneficial to help with skin breakdown in the lower extremity.  Wound care referral placed.  Patient is scheduled to have aortogram with vascular surgery for possible revascularization procedure.  Discussed strict ED and return precautions.  No further questions or concerns     [name], MD [place]  \

## 2021-07-23 NOTE — ED Triage Notes (Signed)
Pt BIB [facility] from home, c/o right leg swelling, redness and pain x 1 month. Pt seen by PCP today for same.

## 2021-07-23 NOTE — Patient Instructions (Signed)
It was great seeing you today!  Your leg swelling looks stable from the previous evaluation.  I will place a referral for wound care and someone should call you.  I think he needs some new compression stockings and would benefit from seeing the wound care doctors.  If you have any issues or concerns please call the clinic or be evaluated in the emergency department.  Happy a wonderful day!

## 2021-07-24 ENCOUNTER — Ambulatory Visit (HOSPITAL_BASED_OUTPATIENT_CLINIC_OR_DEPARTMENT_OTHER): Payer: Medicaid Other | Admitting: Physical Therapy

## 2021-07-24 DIAGNOSIS — M5136 Other intervertebral disc degeneration, lumbar region: Secondary | ICD-10-CM | POA: Diagnosis not present

## 2021-07-24 MED ORDER — HYDROCODONE-ACETAMINOPHEN 5-325 MG PO TABS
1.0000 | ORAL_TABLET | Freq: Once | ORAL | Status: AC
  Administered 2021-07-24: 1 via ORAL
  Filled 2021-07-24: qty 1

## 2021-07-24 MED ORDER — TRAMADOL HCL 50 MG PO TABS
50.0000 mg | ORAL_TABLET | Freq: Four times a day (QID) | ORAL | 0 refills | Status: DC | PRN
Start: 2021-07-24 — End: 2021-10-21

## 2021-07-24 NOTE — Discharge Instructions (Addendum)
You were seen today for ongoing leg pain.  You will be given a short prescription for pain medication.  Do not drive while taking this.  Follow-up with vascular surgery as previously planned for angiography.

## 2021-07-24 NOTE — Assessment & Plan Note (Signed)
Current symptoms stable from previous evaluation.  Believe that a lot of this is related to patient's peripheral arterial disease.  Patient has completed multiple rounds of antibiotics and do not feel that the swelling and erythema are related to infection at this time.  Believe that wound care may be beneficial to help with skin breakdown in the lower extremity.  Wound care referral placed.  Patient is scheduled to have aortogram with vascular surgery for possible revascularization procedure.  Discussed strict ED and return precautions.  No further questions or concerns
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
## 2021-07-27 ENCOUNTER — Other Ambulatory Visit (HOSPITAL_COMMUNITY): Payer: Self-pay

## 2021-07-27 DIAGNOSIS — M5136 Other intervertebral disc degeneration, lumbar region: Secondary | ICD-10-CM | POA: Diagnosis not present

## 2021-07-28 ENCOUNTER — Encounter: Payer: Self-pay | Admitting: Podiatry

## 2021-07-28 ENCOUNTER — Ambulatory Visit (INDEPENDENT_AMBULATORY_CARE_PROVIDER_SITE_OTHER): Payer: Medicaid Other | Admitting: Podiatry

## 2021-07-28 DIAGNOSIS — M79674 Pain in right toe(s): Secondary | ICD-10-CM | POA: Diagnosis not present

## 2021-07-28 DIAGNOSIS — M5136 Other intervertebral disc degeneration, lumbar region: Secondary | ICD-10-CM | POA: Diagnosis not present

## 2021-07-28 DIAGNOSIS — E1151 Type 2 diabetes mellitus with diabetic peripheral angiopathy without gangrene: Secondary | ICD-10-CM

## 2021-07-28 DIAGNOSIS — D689 Coagulation defect, unspecified: Secondary | ICD-10-CM

## 2021-07-28 DIAGNOSIS — M21371 Foot drop, right foot: Secondary | ICD-10-CM

## 2021-07-28 DIAGNOSIS — B351 Tinea unguium: Secondary | ICD-10-CM | POA: Diagnosis not present

## 2021-07-28 DIAGNOSIS — M79675 Pain in left toe(s): Secondary | ICD-10-CM
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
## 2021-08-03 ENCOUNTER — Other Ambulatory Visit: Payer: Self-pay

## 2021-08-03 ENCOUNTER — Ambulatory Visit (HOSPITAL_COMMUNITY)
Admission: RE | Admit: 2021-08-03 | Discharge: 2021-08-03 | Disposition: A | Discharge status: Home / Self Care | Payer: Medicaid Other | Attending: Vascular Surgery | Admitting: Vascular Surgery

## 2021-08-03 ENCOUNTER — Encounter (HOSPITAL_COMMUNITY): Payer: Self-pay | Admitting: Vascular Surgery

## 2021-08-03 ENCOUNTER — Other Ambulatory Visit (HOSPITAL_COMMUNITY): Payer: Self-pay

## 2021-08-03 ENCOUNTER — Encounter (HOSPITAL_COMMUNITY)
Admission: RE | Disposition: A | Discharge status: Home / Self Care | Payer: Self-pay | Source: Home / Self Care | Attending: Vascular Surgery

## 2021-08-03 DIAGNOSIS — M7989 Other specified soft tissue disorders: Secondary | ICD-10-CM | POA: Diagnosis not present

## 2021-08-03 DIAGNOSIS — I70201 Unspecified atherosclerosis of native arteries of extremities, right leg: Secondary | ICD-10-CM | POA: Diagnosis not present

## 2021-08-03 DIAGNOSIS — Z955 Presence of coronary angioplasty implant and graft: Secondary | ICD-10-CM | POA: Insufficient documentation

## 2021-08-03 DIAGNOSIS — I739 Peripheral vascular disease, unspecified: Secondary | ICD-10-CM

## 2021-08-03 DIAGNOSIS — F17210 Nicotine dependence, cigarettes, uncomplicated: Secondary | ICD-10-CM | POA: Diagnosis not present

## 2021-08-03 DIAGNOSIS — I2510 Atherosclerotic heart disease of native coronary artery without angina pectoris: Secondary | ICD-10-CM | POA: Insufficient documentation

## 2021-08-03 DIAGNOSIS — M79604 Pain in right leg: Secondary | ICD-10-CM | POA: Diagnosis not present

## 2021-08-03 HISTORY — PX: ABDOMINAL AORTOGRAM W/LOWER EXTREMITY: CATH118223

## 2021-08-03 LAB — GLUCOSE, CAPILLARY: Glucose-Capillary: 110 mg/dL — ABNORMAL HIGH (ref 70–99)

## 2021-08-03 SURGERY — ABDOMINAL AORTOGRAM W/LOWER EXTREMITY
Anesthesia: LOCAL

## 2021-08-03 MED ORDER — SODIUM CHLORIDE 0.9% FLUSH: 3.0000 mL | INTRAVENOUS | Status: DC | PRN

## 2021-08-03 MED ORDER — FENTANYL CITRATE (PF) 100 MCG/2ML IJ SOLN
INTRAMUSCULAR | Status: DC | PRN
  Administered 2021-08-03: 25 ug via INTRAVENOUS
  Administered 2021-08-03: 50 ug via INTRAVENOUS

## 2021-08-03 MED ORDER — LABETALOL HCL 5 MG/ML IV SOLN: 10.0000 mg | INTRAVENOUS | Status: DC | PRN

## 2021-08-03 MED ORDER — SODIUM CHLORIDE 0.9 % IV SOLN: 250.0000 mL | INTRAVENOUS | Status: DC | PRN

## 2021-08-03 MED ORDER — MIDAZOLAM HCL 2 MG/2ML IJ SOLN
INTRAMUSCULAR | Status: DC | PRN
  Administered 2021-08-03: 1 mg via INTRAVENOUS

## 2021-08-03 MED ORDER — MIDAZOLAM HCL 2 MG/2ML IJ SOLN
INTRAMUSCULAR | Status: AC
  Filled 2021-08-03: qty 2

## 2021-08-03 MED ORDER — HEPARIN (PORCINE) IN NACL 1000-0.9 UT/500ML-% IV SOLN
INTRAVENOUS | Status: AC
  Filled 2021-08-03: qty 1000

## 2021-08-03 MED ORDER — HYDRALAZINE HCL 20 MG/ML IJ SOLN: 5.0000 mg | INTRAMUSCULAR | Status: DC | PRN

## 2021-08-03 MED ORDER — HEPARIN (PORCINE) IN NACL 1000-0.9 UT/500ML-% IV SOLN
INTRAVENOUS | Status: DC | PRN
  Administered 2021-08-03 (×2): 500 mL

## 2021-08-03 MED ORDER — LIDOCAINE HCL (PF) 1 % IJ SOLN
INTRAMUSCULAR | Status: AC
  Filled 2021-08-03: qty 30

## 2021-08-03 MED ORDER — SODIUM CHLORIDE 0.9 % IV SOLN: INTRAVENOUS | Status: DC

## 2021-08-03 MED ORDER — LIDOCAINE HCL (PF) 1 % IJ SOLN
INTRAMUSCULAR | Status: DC | PRN
  Administered 2021-08-03: 18 mL

## 2021-08-03 MED ORDER — MORPHINE SULFATE (PF) 2 MG/ML IV SOLN: 2.0000 mg | INTRAVENOUS | Status: DC | PRN

## 2021-08-03 MED ORDER — ONDANSETRON HCL 4 MG/2ML IJ SOLN: 4.0000 mg | Freq: Four times a day (QID) | INTRAMUSCULAR | Status: DC | PRN

## 2021-08-03 MED ORDER — ACETAMINOPHEN 325 MG PO TABS: 650.0000 mg | ORAL_TABLET | ORAL | Status: DC | PRN

## 2021-08-03 MED ORDER — SODIUM CHLORIDE 0.9% FLUSH: 3.0000 mL | Freq: Two times a day (BID) | INTRAVENOUS | Status: DC

## 2021-08-03 MED ORDER — FENTANYL CITRATE (PF) 100 MCG/2ML IJ SOLN
INTRAMUSCULAR | Status: AC
  Filled 2021-08-03: qty 2

## 2021-08-03 MED ORDER — OXYCODONE HCL 5 MG PO TABS: 5.0000 mg | ORAL_TABLET | ORAL | Status: DC | PRN

## 2021-08-03 MED ORDER — SODIUM CHLORIDE 0.9 % WEIGHT BASED INFUSION
1.0000 mL/kg/h | INTRAVENOUS | Status: DC
Start: 2021-08-03 — End: 2021-08-03

## 2021-08-03 SURGICAL SUPPLY — 15 items
CATH OMNI FLUSH 5F 65CM (CATHETERS) ×1 IMPLANT
CATH SOFT-VU 4F 65 STRAIGHT (CATHETERS) IMPLANT
CATH SOFT-VU ST 4F 90CM (CATHETERS) ×1 IMPLANT
CATH SOFT-VU STRAIGHT 4F 65CM (CATHETERS) ×1
CLOSURE MYNX CONTROL 5F (Vascular Products) ×1 IMPLANT
DEVICE TORQUE .025-.038 (MISCELLANEOUS) ×1 IMPLANT
GLIDEWIRE ANGLED SS 035X260CM (WIRE) ×1 IMPLANT
KIT ANGIASSIST CO2 SYSTEM (KITS) ×1 IMPLANT
KIT MICROPUNCTURE NIT STIFF (SHEATH) ×1 IMPLANT
KIT PV (KITS) ×2 IMPLANT
MAT PREVALON FULL STRYKER (MISCELLANEOUS) ×1 IMPLANT
SHEATH PINNACLE 5F 10CM (SHEATH) ×1 IMPLANT
TRANSDUCER W/STOPCOCK (MISCELLANEOUS) ×2 IMPLANT
TRAY PV CATH (CUSTOM PROCEDURE TRAY) ×2 IMPLANT
WIRE BENTSON .035X145CM (WIRE) ×1 IMPLANT

## 2021-08-03 NOTE — Progress Notes (Signed)
Up and walked and tolerated well; left groin stable, no bleeding or hematoma 

## 2021-08-03 NOTE — Interval H&P Note (Signed)
History and Physical Interval Note:  08/03/2021 7:18 AM  Russell Collier  has presented today for surgery, with the diagnosis of PAD.  The various methods of treatment have been discussed with the patient and family. After consideration of risks, benefits and other options for treatment, the patient has consented to  Procedure(s): ABDOMINAL AORTOGRAM W/LOWER EXTREMITY (N/A) as a surgical intervention. He took eliquis yesterday and I have explained the risks of proceeding but he is willing to deal with complications if they arise up to requiring major surgery. The patient's history has been reviewed, patient examined, no change in status, stable for surgery.  I have reviewed the patient's chart and labs.  Questions were answered to the patient's satisfaction.     [name]

## 2021-08-03 NOTE — Op Note (Signed)
    Patient name: Russell Collier MRN: [id number] DOB: [birth year] Sex: male  08/03/2021 Pre-operative Diagnosis: Right lower extremity pain Post-operative diagnosis:  Same Surgeon:  Erlene Quan C. Donzetta Matters, MD Procedure Performed: 1.  Ultrasound-guided cannulation left common femoral artery 2.  CO2 aortogram 3.  Selection of right lower extremity including common femoral and popliteal artery and right lower extremity angiogram with CO2 and contrast 4.  Moderate sedation with fentanyl and Versed for 42 minutes 5.  Mynx device closure left common femoral artery  Indications: 61 year old male with right lower extremity pain which appears to be multifactorial in nature.  He does not have any ulceration.  He is indicated for angiography with possible intervention.  Findings: Aorta and iliac segments are free of flow-limiting stenosis with CO2.  In the infrainguinal position the common femoral and SFA are patent as is the popliteal artery.  The anterior tibial artery is chronically occluded does not reconstitute.  Tibioperoneal trunk has approximately 50% stenosis in the posterior tibial artery has a 50% stenosis at the takeoff and another 30% stenosis distally but does runoff well to the foot and the peroneal artery after the tibioperoneal trunk does not have any stenosis.  No intervention was undertaken, patient needs smoking cessation, continue physical therapy, weight loss, gentle compression stockings.  We will plan to see him back in 6 months with right lower extremity duplex and bilateral ABIs.   Procedure:  The patient was identified in the holding area and taken to room 8.  The patient was then placed supine on the table and prepped and draped in the usual sterile fashion.  A time out was called.  Ultrasound was used to evaluate the left common femoral artery which was noted a patent and compressible.  His groin was then anesthetized with 1% lidocaine and the common femoral artery was cannulated with  micropuncture needle followed by wire and sheath.  And images saved the permanent record.  Bentson wire was placed followed by 5 [place] sheath.  Omni catheter was placed at L1 and aortogram was performed.  We then crossed the bifurcation with Glidewire placed a straight catheter to the common femoral artery performed CO2 angiography to the area of the knee and placed a Glidewire to the below-knee popliteal artery and placed a straight catheter to that level and perform contrasted angiography.  With the above findings I elected no intervention.  Catheter and wire were removed.  Minx device was deployed.  He tolerated procedure without any complication.  Contrast: 20cc  Maryjean Corpening C. Donzetta Matters, MD [facility] Office: [phone] Pager: [phone]

## 2021-08-05 ENCOUNTER — Telehealth: Payer: Self-pay

## 2021-08-05 NOTE — Telephone Encounter (Signed)
Left voicemail returning pt's call.

## 2021-08-07 ENCOUNTER — Telehealth: Payer: Self-pay | Admitting: Pharmacist

## 2021-08-07 ENCOUNTER — Telehealth (HOSPITAL_COMMUNITY): Payer: Self-pay | Admitting: Pharmacy Technician

## 2021-08-07 NOTE — Telephone Encounter (Signed)
-----   Message from Leavy Cella, [place] sent at 06/09/2021 10:44 AM EDT ----- Regarding: Cut down from 10 to 5 per day ?

## 2021-08-07 NOTE — Telephone Encounter (Signed)
Noted and agreed thank you

## 2021-08-07 NOTE — Telephone Encounter (Signed)
Patient contacted for follow/up of tobacco intake reduction plan to reduce from 10 to 5 at last contact.   Since last contact patient reports stress related to a diagnosis of cellulitis.  He states he may be smoking more than 10 per day.   Most common triggers to use tobacco continue to be stress, habit and boredom. He continues to state he has an interest in quitting and requested continued follow-up.  I agreed to call in in 4-6 weeks.    Total time with patient call and documentation of interaction: 12 minutes.  F/U Phone call planned: 4-6 weeks.

## 2021-08-07 NOTE — Telephone Encounter (Signed)
Patient Advocate Encounter   Received notification from [facility] that prior authorization for Vyndamax is required.   PA submitted on CoverMyMeds Key [id number] Status is pending   Will continue to follow.

## 2021-08-10 ENCOUNTER — Other Ambulatory Visit (HOSPITAL_COMMUNITY): Payer: Self-pay

## 2021-08-10 ENCOUNTER — Other Ambulatory Visit: Payer: Self-pay | Admitting: *Deleted

## 2021-08-10 NOTE — Patient Instructions (Signed)
Visit Information  Russell Collier was given information about Medicaid Managed Care team care coordination services as a part of their [place] Medicaid benefit. Russell Collier verbally consented to engagement with the [facility] Managed Care team.   If you are experiencing a medical emergency, please call 911 or report to your local emergency department or urgent care.   If you have a non-emergency medical problem during routine business hours, please contact your provider's office and ask to speak with a nurse.   For questions related to your [facility], please call: [phone] or visit the homepage here: [email or web address]  If you would like to schedule transportation through your [facility], please call the following number at least 2 days in advance of your appointment: [phone].  Rides for urgent appointments can also be made after hours by calling Member Services.  Call the [place] at [phone], at any time, 24 hours a day, 7 days a week. If you are in danger or need immediate medical attention call 911.  If you would like help to quit smoking, call 1-800-QUIT-NOW 9863230753) OR Espaol: 1-855-Djelo-Ya (2-694-854-6270) o para ms informacin haga clic aqu or Text READY to 200-400 to register via text  Russell Collier,   Please see education materials related to cellulitis provided as print materials.   The patient verbalized understanding of instructions, educational materials, and care plan provided today and agreed to receive a mailed copy of patient instructions, educational materials, and care plan.   Telephone follow up appointment with Managed Medicaid care management team member scheduled for:08/17/21 @ 1:15pm  [name] RN, [place] RN Care Coordinator   Following is a copy of your  plan of care:  Care Plan : RN Care Manager Plan of Care  Updates made by [name], RN since 08/10/2021 12:00 AM     Problem: Health Management Needs Related to Pain      Long-Range Goal: Development of Plan of Care to address health management needs related to pain   Start Date: 02/05/2021  Expected End Date: 09/21/2021  Priority: High  Note:   Current Barriers:  Knowledge Deficits related to plan of care for management of new right foot pain and numbness Russell Collier is post Angiography-no intervention. Advised smoking cessation, weight loss and continue PT. He has been referred to [facility] for right lower leg and has an appointment on 08/12/21 @ 8:45am. Transportation has been arranged. Denies any needs at this time.  RNCM Clinical Goal(s):  Patient will verbalize understanding of plan for management of pain as evidenced by patient verbalization of self monitoring activities take all medications exactly as prescribed and will call provider for medication related questions as evidenced by documentation in EMR    attend all scheduled medical appointments: PT as listed on AVS, Echocardiogram and [facility] Clinic on 05/21/21 and Dr. Claudia Desanctis on 05/28/21 as evidenced by provider documentation in EMR        through collaboration with RN Care manager, provider, and care team.  Interventions: Inter-disciplinary care team collaboration (see longitudinal plan of care) Evaluation of current treatment plan related to  self management and patient's adherence to plan as established by provider [place] MyChart activation for patient to enroll in patient portal RNCM will follow up with patient in one week to assess for needs Advised patient to contact [facility] for glucometer ordered on 07/23/21   Pain:  (Status: Goal  on Track (progressing): YES.) Long Term Goal  Pain assessment performed Medications reviewed-patient reports having and taking all medications as directed Discussed importance of adherence  to all scheduled medical appointments; Counseled on the importance of reporting any/all new or changed pain symptoms or management strategies to pain management provider; Advised patient to report to care team affect of pain on daily activities; Discussed use of relaxation techniques and/or diversional activities to assist with pain reduction (distraction, imagery, relaxation, massage, acupressure, TENS, heat, and cold application; Assessed social determinant of health barriers;  Provided education on cellulitis  Patient Goals/Self-Care Activities: Take medications as prescribed   Attend all scheduled provider appointments Call pharmacy for medication refills 3-7 days in advance of running out of medications Call provider office for new concerns or questions

## 2021-08-10 NOTE — Telephone Encounter (Signed)
Advanced Heart Failure Patient Advocate Encounter  Prior Authorization for Russell Collier has been approved.    PA# [id number] Effective through 08/08/22  Charlann Boxer, CPhT

## 2021-08-10 NOTE — Patient Outreach (Signed)
Medicaid Managed Care   Nurse Care Manager Note  08/10/2021 Name:  Russell Collier MRN:  [id number] DOB:  04-Oct-1960  Russell Collier is an 61 y.o. year old male who is a primary patient of Cresenzo, Angelyn Punt, MD.  The [facility] Managed Care Coordination team was consulted for assistance with:    pain Recent angiography  Russell Collier was given information about Medicaid Managed Care Coordination team services today. Russell Collier Patient agreed to services and verbal consent obtained.  Engaged with patient by telephone for follow up visit in response to provider referral for case management and/or care coordination services.   Assessments/Interventions:  Review of past medical history, allergies, medications, health status, including review of consultants reports, laboratory and other test data, was performed as part of comprehensive evaluation and provision of chronic care management services.  SDOH (Social Determinants of Health) assessments and interventions performed: SDOH Interventions    Flowsheet Row Most Recent Value  SDOH Interventions   Transportation Interventions Intervention Not Indicated  [Utilizing [employer] transportation]       Care Plan  Allergies  Allergen Reactions   Chantix [Varenicline] Anaphylaxis, Swelling and Other (See Comments)    Patient reports laryngospasm stopped in the ER, throat swelling   Bupropion Other (See Comments)    Headache - moderate/severe - self discontinued agent    Nicoderm [Nicotine] Rash    Medications Reviewed Today     Reviewed by [name], RN (Registered Nurse) on 08/10/21 at [place] List Status: <None>   Medication Order Taking? Sig Documenting Provider Last Dose Status Informant  Accu-Chek Softclix Lancets lancets [id number]  Use as instructed Cresenzo, Angelyn Punt, MD  Active Self  acetaminophen (TYLENOL) 325 MG tablet [id number] Yes Take 2 tablets (650 mg total) by mouth every 6 (six) hours as needed for mild pain or moderate pain.  Orvis Brill, DO Taking Active Self  albuterol (PROVENTIL) (2.5 MG/3ML) 0.083% nebulizer solution [id number] Yes Take 3 mLs (2.5 mg total) by nebulization every 6 (six) hours as needed for wheezing or shortness of breath. Concepcion Living, MD Taking Active Self  albuterol (VENTOLIN HFA) 108 (90 Base) MCG/ACT inhaler [id number] Yes Inhale 2 puffs into the lungs every 6 (six) hours as needed for wheezing or shortness of breath. Concepcion Living, MD Taking Active Self  ANORO ELLIPTA 62.5-25 MCG/ACT AEPB [id number] Yes INHALE 1 PUFF INTO THE LUNGS DAILY AS NEEDED.  Patient taking differently: Inhale 1 puff into the lungs daily as needed (for "flares").   Concepcion Living, MD Taking Active Self  apixaban (ELIQUIS) 2.5 MG TABS tablet [id number] Yes Take 1 tablet (2.5 mg total) by mouth 2 (two) times daily. Concepcion Living, MD Taking Active Self  atorvastatin (LIPITOR) 80 MG tablet [id number] Yes Take 1 tablet (80 mg total) by mouth daily. Concepcion Living, MD Taking Active Self  Blood Glucose Monitoring Suppl (ACCU-CHEK GUIDE) w/Device KIT [id number] Yes 1 Units by Does not apply route daily. Concepcion Living, MD Taking Active Self  doxycycline (VIBRA-TABS) 100 MG tablet [id number] No Take 1 tablet (100 mg total) by mouth every 12 (twelve) hours.  Patient not taking: Reported on 07/20/2021   Orvis Brill, DO Not Taking Active Self  enalapril (VASOTEC) 20 MG tablet [id number] Yes Take 1 tablet (20 mg total) by mouth at bedtime. Concepcion Living, MD Taking Active Self  FARXIGA 10 MG TABS tablet [id number] Yes TAKE 1 TABLET (10 MG TOTAL) BY MOUTH DAILY BEFORE BREAKFAST.  Patient taking differently:  Take 10 mg by mouth daily.   Concepcion Living, MD Taking Active Self  fluticasone [facility]) 50 MCG/ACT nasal spray [id number] Yes Place 1 spray into both nostrils daily.  Patient taking differently: Place 1 spray into both nostrils daily as needed for allergies.   Concepcion Living, MD Taking Active Self  furosemide  (LASIX) 40 MG tablet [id number] Yes Take 1.5 tablets (60 mg total) by mouth 2 (two) times daily. Lyda Jester M, PA-C Taking Active Self  glucose blood (ACCU-CHEK GUIDE) test strip [id number] Yes Use as instructed Concepcion Living, MD Taking Active Self  ibuprofen (ADVIL) 200 MG tablet [id number] Yes Take 600 mg by mouth every 6 (six) hours as needed for headache or moderate pain. [provider] Taking Active Self  isosorbide mononitrate (IMDUR) 30 MG 24 hr tablet [id number] Yes Take 1 tablet (30 mg total) by mouth daily. Concepcion Living, MD Taking Active Self  multivitamin (ONE-A-DAY MEN'S) TABS tablet [id number] Yes Take 1 tablet by mouth daily with breakfast. [provider] Taking Active Self  pantoprazole (PROTONIX) 40 MG tablet [id number] Yes TAKE 1 TABLET (40 MG TOTAL) BY MOUTH DAILY.  Patient taking differently: Take 40 mg by mouth daily.   Concepcion Living, MD Taking Active Self  potassium chloride SA (KLOR-CON M) 20 MEQ tablet [id number] Yes Take 2 tablets (40 mEq total) by mouth daily. Consuelo Pandy, PA-C Taking Active Self           Med Note Domingo Sep   Tue Jul 14, 2021  1:44 PM)    pregabalin (LYRICA) 100 MG capsule [id number] Yes TAKE ONE CAPSULE BY MOUTH THREE TIMES DAILY  Patient taking differently: Take 100 mg by mouth 3 (three) times daily.   Concepcion Living, MD Taking Active Self  Tafamidis [employer] [facility]) 61 MG CAPS [id number] Yes TAKE 1 CAPSULE BY MOUTH DAILY.  Patient taking differently: Take 61 mg by mouth daily.   [name], [name], MD Taking Active Self  traMADol (ULTRAM) 50 MG tablet [id number] No Take 1 tablet (50 mg total) by mouth every 6 (six) hours as needed.  Patient not taking: Reported on 08/10/2021   Merryl Hacker, MD Not Taking Active Self  triamcinolone ointment (KENALOG) 0.1 % [id number] Yes Apply 1 application topically 2 (two) times daily.  Patient taking differently: Apply 1 application  topically 2 (two) times daily as  needed (for itching).   Alcus Dad, MD Taking Active Self  valACYclovir (VALTREX) 500 MG tablet [id number] Yes Take 1 tablet (500 mg total) by mouth daily. Concepcion Living, MD Taking Active Self           Med Note Duffy Bruce, Sherrie Mustache Jun 28, 2021  9:45 PM) ONGOING THERAPY  Vitamin A 2400 MCG (8000 UT) TABS [id number] Yes Take 1 tablet by mouth daily.  Patient taking differently: Take 8,000 Units by mouth daily.   [name], [name], MD Taking Active Self           Med Note Duffy Bruce, Sherrie Mustache Jun 28, 2021  9:38 PM)    vutrisiran sodium (AMVUTTRA) 25 MG/0.5ML syringe [id number] Yes Inject 0.5 mLs (25 mg total) into the skin every 3 (three) months. [name], [name], MD Taking Active Self            Patient Active Problem List   Diagnosis Date Noted   Pulmonary hypertension, primary ([place])    Cellulitis 07/09/2021   Cellulitis of right lower extremity  07/04/2021   Urinary tract infection without hematuria    Sepsis ([place]) 06/28/2021   Community acquired pneumonia    Gout 02/26/2021   Skin tag 02/12/2021   Skin cyst 02/12/2021   Anticoagulant long-term use 01/29/2021   Dermoid cyst of skin of nose 12/24/2020   Strain of right calf muscle 07/01/2020   Status post lumbar spinal fusion 06/06/2020   Impaired mobility and ADLs 04/23/2020   Midline low back pain 04/23/2020   Neuropathy, 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   COPD exacerbation ([place]) 03/20/2020   Type 2 diabetes mellitus with diabetic peripheral angiopathy without gangrene, without long-term current use of insulin ([place]) 03/20/2020   Coagulation defect ([place]) 12/11/2019   Fatigue 12/10/2019   Leg cramps 12/10/2019   Frequent epistaxis 10/30/2019   Disc degeneration, 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   Ganglion of right knee 09/27/2019   Lumbar radiculopathy 09/27/2019   Lower extremity edema    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HCC)    Respiratory failure ([place]) 07/27/2019   Primary osteoarthritis of right knee 07/24/2019   Breast tenderness in  male 05/31/2019   Chronic pain 05/31/2019   AKI (acute kidney injury) ([place]) 05/31/2019   Pain due to onychomycosis of toenails of both feet 05/16/2019   Chronic knee pain 04/16/2019   Diabetes ([place]) 04/16/2019   Esophageal reflux 02/06/2019   Heartburn 02/06/2019   Chronic skin ulcer of lower leg ([place]) 12/21/2018   S/P drug eluting coronary stent placement    Coronary artery disease involving native coronary artery of native heart with unstable angina pectoris ([place]) 12/20/2018   Unstable angina (HCC)    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   Acute on chronic diastolic heart failure (HCC)    Shortness of breath    Precordial chest pain    Idiopathic peripheral neuropathy    Palpitations    Chronic diastolic heart failure (HCC)    Abnormal ankle brachial index (ABI)    Chest pain, rule out acute myocardial infarction 09/26/2018   History of cocaine abuse ([place]) 08/20/2018   Marijuana use 08/20/2018   Morbid obesity with BMI of 40.0-44.9, adult ([place]) 08/20/2018   Dyspepsia    Morbid obesity (HCC)    OSA on CPAP    Chest pain 05/30/2018   CAD (coronary artery disease) 03/30/2018   Elevated troponin 02/03/2018   Positive urine drug screen 02/03/2018   Tobacco abuse 02/03/2018   Hyperlipidemia 02/03/2018   Acute respiratory failure with hypoxia ([place]) 02/02/2018   Chronic congestive heart failure (HCC)    Keratoconjunctivitis sicca of left eye not specified as Sjogren's 09/06/2017   Long term current use of oral hypoglycemic drug 09/06/2017   Mechanical ectropion of left lower eyelid 09/06/2017   Nuclear sclerotic cataract of both eyes 09/06/2017   Refractive amblyopia, left 09/06/2017   Type 2 diabetes mellitus without complication, without long-term current use of insulin ([place]) 09/06/2017   Hyperopia of both eyes with astigmatism and presbyopia 09/06/2017   Atypical chest pain 12/27/2015   Essential hypertension    Neuropathy     Conditions to be addressed/monitored per PCP order:    Pain and recent angiography  Care Plan : [place] of Care  Updates made by [name], RN since 08/10/2021 12:00 AM     Problem: Health Management Needs Related to Pain      Long-Range Goal: Development of Plan of Care to address health management needs related to pain   Start Date: 02/05/2021  Expected End Date: 09/21/2021  Priority: High  Note:  Current Barriers:  Knowledge Deficits related to plan of care for management of new right foot pain and numbness Russell Collier is post Angiography-no intervention. Advised smoking cessation, weight loss and continue PT. He has been referred to [facility] for right lower leg and has an appointment on 08/12/21 @ 8:45am. Transportation has been arranged. Denies any needs at this time.  RNCM Clinical Goal(s):  Patient will verbalize understanding of plan for management of pain as evidenced by patient verbalization of self monitoring activities take all medications exactly as prescribed and will call provider for medication related questions as evidenced by documentation in EMR    attend all scheduled medical appointments: PT as listed on AVS, Echocardiogram and [facility] Clinic on 05/21/21 and Dr. Claudia Desanctis on 05/28/21 as evidenced by provider documentation in EMR        through collaboration with RN Care manager, provider, and care team.  Interventions: Inter-disciplinary care team collaboration (see longitudinal plan of care) Evaluation of current treatment plan related to  self management and patient's adherence to plan as established by provider [place] MyChart activation for patient to enroll in patient portal RNCM will follow up with patient in one week to assess for needs Advised patient to contact [facility] for glucometer ordered on 07/23/21   Pain:  (Status: Goal on Track (progressing): YES.) Long Term Goal  Pain assessment performed Medications reviewed-patient reports having and taking all medications as directed Discussed  importance of adherence to all scheduled medical appointments; Counseled on the importance of reporting any/all new or changed pain symptoms or management strategies to pain management provider; Advised patient to report to care team affect of pain on daily activities; Discussed use of relaxation techniques and/or diversional activities to assist with pain reduction (distraction, imagery, relaxation, massage, acupressure, TENS, heat, and cold application; Assessed social determinant of health barriers;  Provided education on cellulitis  Patient Goals/Self-Care Activities: Take medications as prescribed   Attend all scheduled provider appointments Call pharmacy for medication refills 3-7 days in advance of running out of medications Call provider office for new concerns or questions        Follow Up:  Patient agrees to Care Plan and Follow-up.  Plan: The Managed Medicaid care management team will reach out to the patient again over the next 7 days.  Date/time of next scheduled RN care management/care coordination outreach:  08/17/21 @ 1:15pm  [name] RN, [place] RN Care Coordinator

## 2021-08-12 ENCOUNTER — Encounter (HOSPITAL_BASED_OUTPATIENT_CLINIC_OR_DEPARTMENT_OTHER): Payer: Medicaid Other | Attending: General Surgery | Admitting: Physician Assistant

## 2021-08-12 DIAGNOSIS — I10 Essential (primary) hypertension: Secondary | ICD-10-CM | POA: Insufficient documentation

## 2021-08-12 DIAGNOSIS — I87332 Chronic venous hypertension (idiopathic) with ulcer and inflammation of left lower extremity: Secondary | ICD-10-CM | POA: Diagnosis not present

## 2021-08-12 DIAGNOSIS — I87333 Chronic venous hypertension (idiopathic) with ulcer and inflammation of bilateral lower extremity: Secondary | ICD-10-CM | POA: Insufficient documentation

## 2021-08-12 DIAGNOSIS — I2510 Atherosclerotic heart disease of native coronary artery without angina pectoris: Secondary | ICD-10-CM | POA: Insufficient documentation

## 2021-08-12 DIAGNOSIS — G4733 Obstructive sleep apnea (adult) (pediatric): Secondary | ICD-10-CM | POA: Diagnosis not present

## 2021-08-12 DIAGNOSIS — E11622 Type 2 diabetes mellitus with other skin ulcer: Secondary | ICD-10-CM | POA: Diagnosis not present

## 2021-08-12 DIAGNOSIS — I7389 Other specified peripheral vascular diseases: Secondary | ICD-10-CM | POA: Diagnosis not present

## 2021-08-12 DIAGNOSIS — L97822 Non-pressure chronic ulcer of other part of left lower leg with fat layer exposed: Secondary | ICD-10-CM | POA: Insufficient documentation

## 2021-08-12 DIAGNOSIS — F17210 Nicotine dependence, cigarettes, uncomplicated: Secondary | ICD-10-CM | POA: Insufficient documentation

## 2021-08-12 NOTE — Progress Notes (Signed)
QUENTEZ, LOBER ([id number]) Visit Report for 08/12/2021 Abuse Risk Screen Details Patient Name: Date of Service: Russell Collier 08/12/2021 8:45 A M Medical Record Number: [id number] Patient Account Number: [id number] Date of Birth/Sex: Treating RN: 1960-09-08 (61 y.o. [name] Primary Care Iseah Plouff: York Ram Other Clinician: Referring Terrace Chiem: Treating Keontre Defino/Extender: [place] Service in Treatment: 0 Abuse Risk Screen Items Answer ABUSE RISK SCREEN: Has anyone close to you tried to hurt or harm you recentlyo No Do you feel uncomfortable with anyone in your familyo No Has anyone forced you do things that you didnt want to doo No Electronic Signature(s) Signed: 08/12/2021 4:35:31 PM By: Lorrin Jackson Entered By: Lorrin Jackson on 08/12/2021 08:36:11 -------------------------------------------------------------------------------- Activities of Daily Living Details Patient Name: Date of Service: Russell Collier 08/12/2021 8:45 A M Medical Record Number: [id number] Patient Account Number: [id number] Date of Birth/Sex: Treating RN: [birth year] (61 y.o. [name] Primary Care Apoorva Bugay: York Ram Other Clinician: Referring Alaja Goldinger: Treating Kilee Hedding/Extender: [place] Service in Treatment: 0 Activities of Daily Living Items Answer Activities of Daily Living (Please select one for each item) Drive Automobile Completely Able T Medications ake Completely Able Use T elephone Completely Able Care for Appearance Completely Able Use T oilet Completely Able Bath / Shower Need Assistance Dress Self Completely Able Feed Self Completely Able Walk Need Assistance Get In / Out Bed Completely English for Self Need Assistance Electronic Signature(s) Signed: 08/12/2021 4:35:31 PM By: Lorrin Jackson Entered By: Lorrin Jackson on 08/12/2021 08:36:58 -------------------------------------------------------------------------------- Education Screening Details Patient Name: Date of Service: Russell Collier, Russell Collier 08/12/2021 8:45 A M Medical Record Number: [id number] Patient Account Number: [id number] Date of Birth/Sex: Treating RN: 06-30-1960 (61 y.o. [name] Primary Care Preethi Scantlebury: York Ram Other Clinician: Referring Karenna Romanoff: Treating Zameria Vogl/Extender: [place] Service in Treatment: 0 Primary Learner Assessed: Patient Learning Preferences/Education Level/Primary Language Learning Preference: Explanation, Demonstration, Printed Material Highest Education Level: High School Preferred Language: English Cognitive Barrier Language Barrier: No Translator Needed: No Memory Deficit: No Emotional Barrier: No Cultural/Religious Beliefs Affecting Medical Care: No Physical Barrier Impaired Vision: Yes Glasses Impaired Hearing: No Decreased Hand dexterity: No Knowledge/Comprehension Knowledge Level: High Comprehension Level: High Ability to understand written instructions: High Ability to understand verbal instructions: High Motivation Anxiety Level: Calm Cooperation: Cooperative Education Importance: Acknowledges Need Interest in Health Problems: Asks Questions Perception: Coherent Willingness to Engage in Self-Management High Activities: Readiness to Engage in Self-Management High Activities: Electronic Signature(s) Signed: 08/12/2021 4:35:31 PM By: Lorrin Jackson Entered By: Lorrin Jackson on 08/12/2021 08:37:31 -------------------------------------------------------------------------------- Fall Risk Assessment Details Patient Name: Date of Service: Russell Collier, Russell Collier 08/12/2021 8:45 A M Medical Record Number: [id number] Patient Account Number: [id number] Date of Birth/Sex: Treating RN: 03-04-1960 (61 y.o. [name] Primary Care Samariyah Cowles: [place] Ram Other Clinician: Referring Benigna Delisi: Treating Pericles Carmicheal/Extender: Debbora Lacrosse, Janie Morning in Treatment: 0 Fall Risk Assessment Items Have you had 2 or more falls in the last 12 monthso 0 No Have you had any fall that resulted in injury in the last 12 monthso 0 No FALLS RISK SCREEN History of falling - immediate or within 3 months 0 No Secondary diagnosis (Do you have 2 or more medical diagnoseso) 15 Yes Ambulatory aid None/bed rest/wheelchair/nurse 0 No Crutches/cane/walker 15 Yes Furniture 0 No Intravenous therapy Access/Saline/Heparin Lock 0 No Gait/Transferring Normal/ bed rest/ wheelchair  0 Yes Weak (short steps with or without shuffle, stooped but able to lift head while walking, may seek 0 No support from furniture) Impaired (short steps with shuffle, may have difficulty arising from chair, head down, impaired 0 No balance) Mental Status Oriented to own ability 0 Yes Electronic Signature(s) Signed: 08/12/2021 4:35:31 PM By: Lorrin Jackson Entered By: Lorrin Jackson on 08/12/2021 08:37:44 -------------------------------------------------------------------------------- Foot Assessment Details Patient Name: Date of Service: Russell Collier, Russell Collier 08/12/2021 8:45 A M Medical Record Number: [id number] Patient Account Number: [id number] Date of Birth/Sex: Treating RN: 06-Feb-1961 (61 y.o. [name] Primary Care Mayuri Staples: [place] Ram Other Clinician: Referring Camia Dipinto: Treating Aiken Withem/Extender: [place] Service in Treatment: 0 Foot Assessment Items Site Locations + = Sensation present, - = Sensation absent, C = Callus, U = Ulcer R = Redness, W = Warmth, M = Maceration, PU = Pre-ulcerative lesion F = Fissure, S = Swelling, D = Dryness Assessment Right: Left: Other Deformity: No No Prior Foot Ulcer: No No Prior Amputation: No No Charcot Joint: No No Ambulatory Status: Ambulatory With Help Assistance Device:  Walker Gait: Steady Electronic Signature(s) Signed: 08/12/2021 4:35:31 PM By: Lorrin Jackson Entered By: Lorrin Jackson on 08/12/2021 08:40:49 -------------------------------------------------------------------------------- Nutrition Risk Screening Details Patient Name: Date of Service: Russell Collier 08/12/2021 8:45 A M Medical Record Number: [id number] Patient Account Number: [id number] Date of Birth/Sex: Treating RN: 1960-09-30 (61 y.o. [name] Primary Care Shanessa Hodak: [place] Ram Other Clinician: Referring Madelyn Tlatelpa: Treating Liyah Higham/Extender: Debbora Lacrosse, Janie Morning in Treatment: 0 Height (in): 70 Weight (lbs): 315 Body Mass Index (BMI): 45.2 Nutrition Risk Screening Items Score Screening NUTRITION RISK SCREEN: I have an illness or condition that made me change the kind and/or amount of food I eat 0 No I eat fewer than two meals per day 0 No I eat few fruits and vegetables, or milk products 0 No I have three or more drinks of beer, liquor or wine almost every day 0 No I have tooth or mouth problems that make it hard for me to eat 0 No I don't always have enough money to buy the food I need 0 No I eat alone most of the time 0 No I take three or more different prescribed or over-the-counter drugs a day 1 Yes Without wanting to, I have lost or gained 10 pounds in the last six months 0 No I am not always physically able to shop, cook and/or feed myself 0 No Nutrition Protocols Good Risk Protocol 0 No interventions needed Moderate Risk Protocol High Risk Proctocol Risk Level: Good Risk Score: 1 Electronic Signature(s) Signed: 08/12/2021 4:35:31 PM By: Lorrin Jackson Entered By: Lorrin Jackson on 08/12/2021 08:37:51

## 2021-08-12 NOTE — Progress Notes (Addendum)
AADITYA, LETIZIA ([id number]) Visit Report for 08/12/2021 Allergy List Details Patient Name: Date of Service: Dwan Bolt 08/12/2021 8:45 A M Medical Record Number: [id number] Patient Account Number: [id number] Date of Birth/Sex: Treating RN: [birth year] (61 y.o. [name] Primary Care Yeraldine Forney: York Ram Other Clinician: Referring Jarrick Fjeld: Treating Hartman Minahan/Extender: Debbora Lacrosse, Janie Morning in Treatment: 0 Allergies Active Allergies Chantix Reaction: anaphylaxis bupropion Nicoderm CQ Allergy Notes Electronic Signature(s) Signed: 08/12/2021 8:19:03 AM By: Lorrin Jackson Entered By: Lorrin Jackson on 08/12/2021 08:19:03 -------------------------------------------------------------------------------- Arrival Information Details Patient Name: Date of Service: [name], [name] 08/12/2021 8:45 A M Medical Record Number: [id number] Patient Account Number: [id number] Date of Birth/Sex: Treating RN: 03/15/1960 (61 y.o. [name] Primary Care Kreig Parson: York Ram Other Clinician: Referring Katha Kuehne: Treating Oval Moralez/Extender: [place] Service in Treatment: 0 Visit Information Patient Arrived: Charlyn Minerva Time: 08:22 Transfer Assistance: None Patient Identification Verified: Yes Secondary Verification Process Completed: Yes Patient Requires Transmission-Based Precautions: No Patient Has Alerts: Yes Patient Alerts: Patient on Blood Thinner History Since Last Visit Added or deleted any medications: No Any new allergies or adverse reactions: No Had a fall or experienced change in activities of daily living that may affect risk of falls: No Signs or symptoms of abuse/neglect since last visito No Hospitalized since last visit: No Implantable device outside of the clinic excluding cellular tissue based products placed in the center since last visit: No Has Dressing in Place as Prescribed: Yes Pain Present Now:  Yes Electronic Signature(s) Signed: 08/12/2021 4:35:31 PM By: Lorrin Jackson Entered By: Lorrin Jackson on 08/12/2021 08:29:32 -------------------------------------------------------------------------------- Clinic Level of Care Assessment Details Patient Name: Date of Service: Dwan Bolt 08/12/2021 8:45 A M Medical Record Number: [id number] Patient Account Number: [id number] Date of Birth/Sex: Treating RN: August 12, 1960 (61 y.o. [name] Primary Care Cruz Devilla: York Ram Other Clinician: Referring Shrinika Blatz: Treating Jameila Keeny/Extender: [place] Service in Treatment: 0 Clinic Level of Care Assessment Items TOOL 2 Quantity Score X- 1 0 Use when only an EandM is performed on the INITIAL visit ASSESSMENTS - Nursing Assessment / Reassessment X- 1 20 General Physical Exam (combine w/ comprehensive assessment (listed just below) when performed on new pt. evals) X- 1 25 Comprehensive Assessment (HX, ROS, Risk Assessments, Wounds Hx, etc.) ASSESSMENTS - Wound and Skin A ssessment / Reassessment X - Simple Wound Assessment / Reassessment - one wound 1 5 '[]'$  - 0 Complex Wound Assessment / Reassessment - multiple wounds '[]'$  - 0 Dermatologic / Skin Assessment (not related to wound area) ASSESSMENTS - Ostomy and/or Continence Assessment and Care '[]'$  - 0 Incontinence Assessment and Management '[]'$  - 0 Ostomy Care Assessment and Management (repouching, etc.) PROCESS - Coordination of Care '[]'$  - 0 Simple Patient / Family Education for ongoing care X- 1 20 Complex (extensive) Patient / Family Education for ongoing care X- 1 10 Staff obtains Programmer, systems, Records, T Results / Process Orders est X- 1 10 Staff telephones HHA, Nursing Homes / Clarify orders / etc '[]'$  - 0 Routine Transfer to another Facility (non-emergent condition) '[]'$  - 0 Routine Hospital Admission (non-emergent condition) '[]'$  - 0 New Admissions / Biomedical engineer / Ordering NPWT  Apligraf, etc. , '[]'$  - 0 Emergency Hospital Admission (emergent condition) '[]'$  - 0 Simple Discharge Coordination '[]'$  - 0 Complex (extensive) Discharge Coordination PROCESS - Special Needs '[]'$  - 0 Pediatric / Minor Patient Management '[]'$  - 0 Isolation Patient Management '[]'$  - 0 Hearing / Language /  Visual special needs '[]'$  - 0 Assessment of Community assistance (transportation, D/C planning, etc.) '[]'$  - 0 Additional assistance / Altered mentation '[]'$  - 0 Support Surface(s) Assessment (bed, cushion, seat, etc.) INTERVENTIONS - Wound Cleansing / Measurement X- 1 5 Wound Imaging (photographs - any number of wounds) '[]'$  - 0 Wound Tracing (instead of photographs) X- 1 5 Simple Wound Measurement - one wound '[]'$  - 0 Complex Wound Measurement - multiple wounds X- 1 5 Simple Wound Cleansing - one wound '[]'$  - 0 Complex Wound Cleansing - multiple wounds INTERVENTIONS - Wound Dressings '[]'$  - 0 Small Wound Dressing one or multiple wounds '[]'$  - 0 Medium Wound Dressing one or multiple wounds X- 1 20 Large Wound Dressing one or multiple wounds '[]'$  - 0 Application of Medications - injection INTERVENTIONS - Miscellaneous '[]'$  - 0 External ear exam '[]'$  - 0 Specimen Collection (cultures, biopsies, blood, body fluids, etc.) '[]'$  - 0 Specimen(s) / Culture(s) sent or taken to Lab for analysis '[]'$  - 0 Patient Transfer (multiple staff / Civil Service fast streamer / Similar devices) '[]'$  - 0 Simple Staple / Suture removal (25 or less) '[]'$  - 0 Complex Staple / Suture removal (26 or more) '[]'$  - 0 Hypo / Hyperglycemic Management (close monitor of Blood Glucose) X- 1 15 Ankle / Brachial Index (ABI) - do not check if billed separately Has the patient been seen at the hospital within the last three years: Yes Total Score: 140 Level Of Care: New/Established - Level 4 Electronic Signature(s) Signed: 08/12/2021 4:35:31 PM By: Lorrin Jackson Entered By: Lorrin Jackson on 08/12/2021  09:34:12 -------------------------------------------------------------------------------- Encounter Discharge Information Details Patient Name: Date of Service: [name], [name] 08/12/2021 8:45 A M Medical Record Number: [id number] Patient Account Number: [id number] Date of Birth/Sex: Treating RN: 02-Mar-1960 (61 y.o. [name] Primary Care Takoda Siedlecki: York Ram Other Clinician: Referring Lafaye Mcelmurry: Treating Yashvi Jasinski/Extender: Debbora Lacrosse, Janie Morning in Treatment: 0 Encounter Discharge Information Items Discharge Condition: Stable Ambulatory Status: Walker Discharge Destination: Home Transportation: Private Auto Schedule Follow-up Appointment: Yes Clinical Summary of Care: Provided on 08/12/2021 Form Type Recipient Paper Patient Patient Electronic Signature(s) Signed: 08/12/2021 4:35:31 PM By: Lorrin Jackson Entered By: Lorrin Jackson on 08/12/2021 09:39:18 -------------------------------------------------------------------------------- Lower Extremity Assessment Details Patient Name: Date of Service: Dwan Bolt 08/12/2021 8:45 A M Medical Record Number: [id number] Patient Account Number: [id number] Date of Birth/Sex: Treating RN: December 18, 1960 (61 y.o. [name] Primary Care Stephanine Reas: York Ram Other Clinician: Referring Zymere Patlan: Treating Rita Prom/Extender: [place] Service in Treatment: 0 Edema Assessment Assessed: [name]: Yes] [name]: Yes] Edema: [Left: Yes] [Right: Yes] Calf Left: Right: Point of Measurement: 37 cm From Medial Instep 41.5 cm 44.2 cm Ankle Left: Right: Point of Measurement: 10 cm From Medial Instep 26.5 cm 25.8 cm Knee To Floor Left: Right: From Medial Instep 46 cm 46 cm Vascular Assessment Pulses: Dorsalis Pedis Palpable: [Left:No] [Right:No] Doppler Audible: [Left:Yes] [Right:Yes] Blood Pressure: Brachial: [Left:123] [Right:123] Ankle: [Left:Dorsalis Pedis: 92  0.75] [Right:Dorsalis Pedis: 80 0.65] Electronic Signature(s) Signed: 08/12/2021 4:35:31 PM By: Lorrin Jackson Entered By: Lorrin Jackson on 08/12/2021 09:20:45 -------------------------------------------------------------------------------- Multi-Disciplinary Care Plan Details Patient Name: Date of Service: [name], [name] 08/12/2021 8:45 A M Medical Record Number: [id number] Patient Account Number: [id number] Date of Birth/Sex: Treating RN: [birth year] (61 y.o. [name] Primary Care Shanicka Oldenkamp: York Ram Other Clinician: Referring Unique Searfoss: Treating Alicya Bena/Extender: Debbora Lacrosse, Janie Morning in Treatment: 0 Active Inactive Tissue Oxygenation Nursing Diagnoses: Actual ineffective tissue  perfusion; peripheral (select once diagnosis is confirmed) Goals: Patient/caregiver will verbalize understanding of disease process and disease management Date Initiated: 08/12/2021 Target Resolution Date: 09/09/2021 Goal Status: Active Interventions: Assess peripheral arterial status upon admission and as needed Provide education on tissue oxygenation and ischemia Notes: Wound/Skin Impairment Nursing Diagnoses: Impaired tissue integrity Goals: Patient/caregiver will verbalize understanding of skin care regimen Date Initiated: 08/12/2021 Target Resolution Date: 09/09/2021 Goal Status: Active Ulcer/skin breakdown will have a volume reduction of 30% by week 4 Date Initiated: 08/12/2021 Target Resolution Date: 09/09/2021 Goal Status: Active Interventions: Assess patient/caregiver ability to obtain necessary supplies Assess patient/caregiver ability to perform ulcer/skin care regimen upon admission and as needed Assess ulceration(s) every visit Provide education on smoking Provide education on ulcer and skin care Treatment Activities: Smoking cessation education : 08/12/2021 Topical wound management initiated : 08/12/2021 Notes: Electronic Signature(s) Signed:  08/12/2021 9:06:22 AM By: Lorrin Jackson Entered By: Lorrin Jackson on 08/12/2021 09:06:21 -------------------------------------------------------------------------------- Pain Assessment Details Patient Name: Date of Service: [name], [name] 08/12/2021 8:45 A M Medical Record Number: [id number] Patient Account Number: [id number] Date of Birth/Sex: Treating RN: [birth year] (61 y.o. [name] Primary Care Alani Lacivita: York Ram Other Clinician: Referring Chantae Soo: Treating Ranald Alessio/Extender: [place] Service in Treatment: 0 Active Problems Location of Pain Severity and Description of Pain Patient Has Paino Yes Site Locations Pain Location: Pain in Ulcers With Dressing Change: Yes Duration of the Pain. Constant / Intermittento Intermittent Rate the pain. Current Pain Level: 8 Character of Pain Describe the Pain: Sharp, Throbbing Pain Management and Medication Current Pain Management: Medication: Yes Cold Application: No Rest: Yes Massage: No Activity: No T.E.N.S.: No Heat Application: No Leg drop or elevation: No Is the Current Pain Management Adequate: Inadequate How does your wound impact your activities of daily livingo Sleep: Yes Bathing: No Appetite: No Relationship With Others: No Bladder Continence: No Emotions: No Bowel Continence: No Work: No Toileting: No Drive: No Dressing: No Hobbies: No Electronic Signature(s) Signed: 08/12/2021 4:35:31 PM By: Lorrin Jackson Entered By: Lorrin Jackson on 08/12/2021 08:38:35 -------------------------------------------------------------------------------- Patient/Caregiver Education Details Patient Name: Date of Service: [name], [name] 6/21/2023andnbsp8:45 A M Medical Record Number: [id number] Patient Account Number: [id number] Date of Birth/Gender: Treating RN: 07-Oct-1960 (61 y.o. [name] Primary Care Physician: York Ram Other Clinician: Referring  Physician: Treating Physician/Extender: Debbora Lacrosse, Janie Morning in Treatment: 0 Education Assessment Education Provided To: Patient Education Topics Provided Smoking and Wound Healing: Methods: Explain/Verbal, Printed Responses: State content correctly Tissue Oxygenation: Methods: Explain/Verbal Responses: State content correctly Venous: Methods: Explain/Verbal, Printed Responses: State content correctly Wound/Skin Impairment: Methods: Explain/Verbal, Printed Responses: State content correctly Electronic Signature(s) Signed: 08/12/2021 4:35:31 PM By: Lorrin Jackson Entered By: Lorrin Jackson on 08/12/2021 09:33:08 -------------------------------------------------------------------------------- Wound Assessment Details Patient Name: Date of Service: [name], [name] 08/12/2021 8:45 A M Medical Record Number: [id number] Patient Account Number: [id number] Date of Birth/Sex: Treating RN: 12/27/1960 (61 y.o. [name] Primary Care Dysen Edmondson: York Ram Other Clinician: Referring Rudine Rieger: Treating Anthoni Geerts/Extender: [place] Service in Treatment: 0 Wound Status Wound Number: 2 Primary Arterial Insufficiency Ulcer Etiology: Wound Location: Left Lower Leg Wound Open Wounding Event: Trauma Status: Date Acquired: 08/03/2021 Comorbid Hemophilia, Asthma, Sleep Apnea, Angina, Congestive Heart Weeks Of Treatment: 0 History: Failure, Coronary Artery Disease, Hypertension, Peripheral Arterial Clustered Wound: No Disease, Type II Diabetes, Osteoarthritis, Neuropathy Photos Wound Measurements Length: (cm) 1.5 Width: (cm) 1.5 Depth: (cm) 0.2 Area: (cm) 1.767 Volume: (cm)  0.353 % Reduction in Area: 0% % Reduction in Volume: 0% Epithelialization: None Tunneling: No Undermining: No Wound Description Classification: Full Thickness Without Exposed Support Structures Wound Margin: Distinct, outline attached Exudate Amount:  Medium Exudate Type: Serous Exudate Color: amber Foul Odor After Cleansing: No Slough/Fibrino No Wound Bed Granulation Amount: Large (67-100%) Exposed Structure Granulation Quality: Red Fascia Exposed: No Necrotic Amount: Small (1-33%) Fat Layer (Subcutaneous Tissue) Exposed: Yes Necrotic Quality: Eschar Tendon Exposed: No Muscle Exposed: No Joint Exposed: No Bone Exposed: No Electronic Signature(s) Signed: 08/12/2021 4:35:31 PM By: Lorrin Jackson Entered By: Lorrin Jackson on 08/12/2021 08:53:40 -------------------------------------------------------------------------------- Vitals Details Patient Name: Date of Service: [name], [name] 08/12/2021 8:45 A M Medical Record Number: [id number] Patient Account Number: [id number] Date of Birth/Sex: Treating RN: June 18, 1960 (61 y.o. [name] Primary Care Jaliah Foody: York Ram Other Clinician: Referring Kaislee Chao: Treating Jhovani Griswold/Extender: [place] Service in Treatment: 0 Vital Signs Time Taken: 08:29 Temperature (F): 99.0 Height (in): 70 Pulse (bpm): 73 Source: Stated Respiratory Rate (breaths/min): 18 Weight (lbs): 315 Blood Pressure (mmHg): 123/76 Source: Stated Reference Range: 80 - 120 mg / dl Body Mass Index (BMI): 45.2 Electronic Signature(s) Signed: 08/12/2021 4:35:31 PM By: Lorrin Jackson Entered By: Lorrin Jackson on 08/12/2021 08:31:49

## 2021-08-12 NOTE — Progress Notes (Signed)
DINNIS, ROG ([id number]) Visit Report for 08/12/2021 Chief Complaint Document Details Patient Name: Date of Service: Russell Collier 08/12/2021 8:45 A M Medical Record Number: [id number] Patient Account Number: [id number] Date of Birth/Sex: Treating RN: 1961/02/18 (61 y.o. [name] Primary Care Provider: York Ram Other Clinician: Referring Provider: Treating Provider/Extender: Debbora Lacrosse, Janie Morning in Treatment: 0 Information Obtained from: Patient Chief Complaint Left anterior LE Ulcer Electronic Signature(s) Signed: 08/12/2021 9:02:49 AM By: Worthy Keeler PA-C Entered By: Worthy Keeler on 08/12/2021 09:02:49 -------------------------------------------------------------------------------- HPI Details Patient Name: Date of Service: [place], Russell Collier 08/12/2021 8:45 A M Medical Record Number: [id number] Patient Account Number: [id number] Date of Birth/Sex: Treating RN: June 21, 1960 (61 y.o. [name] Primary Care Provider: York Ram Other Clinician: Referring Provider: Treating Provider/Extender: Debbora Lacrosse, Janie Morning in Treatment: 0 History of Present Illness HPI Description: ADMISSION 09/14/2018 This is a 61 year old man who traumatized his left anterior tibia area on the bathtub. He describes this initially as a bruise that subsequently reopened. He has been seen in the ER several times and has had at least 3 courses of antibiotics including doxy and Keflex twice. He has been using Neosporin and peroxide on this. Past medical history includes obstructive sleep apnea, cellulitis, hypertension, coronary artery disease, peripheral neuropathy [nondiabetic], currently on a heart murmur for atypical chest pain and bradycardia. 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 ARTERIAL STUDIES done in March 2020 showed an ABI on the left of 0.93, TBI on the left slightly reduced at 0.42. On the right his ABI was 0.95 with a  TBI of 0.51 he has monophasic waveforms in the lower extremities bilaterally. Interpreted on the left this showing mild left lower extremity arterial disease as well as on the right. 09/25/18-Patient returns at 1 week to the clinic, noted that arterial studies done in March 2020. We have been using Iodoflex to the wound on the left leg, patient has significant degree of pain and declines to have any debridement 8/21; the patient has not been here in 3 weeks. Since he was last here he was admitted to hospital on 8/4. He was felt to have stable angina. He has since been to his cardiologist. I note that he has an EF of about 48%. He has a venous wound on the left anterior mid tibia area probably with lymphedema. We have been using Iodoflex. He does not tolerate debridement well. He tells me that his cardiologist has been adjusting his Lasix and then the wraps fall down he tries to pull them up. He is describing pain making it difficult for him to function. 9/3; patient is was hospitalized since he was last here with COPD exacerbation. Came in with Aquacel Ag on the wound. The wound actually looks improved. 9/17; patient was apparently in the ER again with breathing issues and blood pressure issues. He was not admitted. We are using Sorbact of the wound with nice improvement dimensions are better 10/1; the patient's wound is smaller and appears to have a reasonable healthy surface. We are using Sorbact 10/8; the wound is smaller although there is still an area with depth and necrotic debris at the base. We are using Sorbact 10/15; not much change in the wound area. We have been using Sorbact. 10/22; small wound area using endoform since last week 11/5; Russell Collier arrived today in a healed state. Left leg and wrapped. He still does not have compression stockings Readmission: 08-12-2021 upon evaluation today patient  presents for evaluation here in the clinic he has been seen last in 2020 at that point Dr.  Dellia Nims was the treating physician. Subsequently the patient tells me that he does have a history of peripheral vascular disease, chronic venous insufficiency, diabetes mellitus type 2, and coronary artery disease. Unfortunately he has had some issues with his right leg he recently has been [facility] and they did perform an arteriogram on the right leg I believe even putting a stent in. With that being said that leg seems to be doing well he mainly just needs a compression sock. In regard to the left leg which is the one in question he tells me the same day he had a surgery he hit the left leg on a piece of furniture on the anterior shin. This caused a area that seems to be a hematoma present at this point. With that being said the patient is having significant discomfort. He tells me. He has been taking ibuprofen which he should not be doing I discussed that with him today. Tylenol is really only should be taken being on the Eliquis. He also tells me that he has not really been put anything on other than a Band-Aid I really think he may benefit from some light compression I do not want to do anything too tight until we get a good review of his arterial flow as his ABIs are not good we got here in the office today was 0.75 on the left 0.65 on the right and to be honest he did not have palpable pulses that I could find. He does not appear to have had an arterial study since 2020 as noted above by Dr. Dellia Nims. Electronic Signature(s) Signed: 08/12/2021 1:00:11 PM By: Worthy Keeler PA-C Entered By: Worthy Keeler on 08/12/2021 13:00:11 -------------------------------------------------------------------------------- Physical Exam Details Patient Name: Date of Service: Russell Collier 08/12/2021 8:45 A M Medical Record Number: [id number] Patient Account Number: [id number] Date of Birth/Sex: Treating RN: December 03, 1960 (61 y.o. [name] Primary Care Provider:  York Ram Other Clinician: Referring Provider: Treating Provider/Extender: [place] [facility] in Treatment: 0 Constitutional sitting or standing blood pressure is within target range for patient.. pulse regular and within target range for patient.Marland Kitchen respirations regular, non-labored and within target range for patient.Marland Kitchen temperature within target range for patient.. Well-nourished and well-hydrated in no acute distress. Eyes conjunctiva clear no eyelid edema noted. pupils equal round and reactive to light and accommodation. Ears, Nose, Mouth, and Throat no gross abnormality of ear auricles or external auditory canals. normal hearing noted during conversation. mucus membranes moist. Respiratory normal breathing without difficulty. Cardiovascular Absent posterior tibial and dorsalis pedis pulses bilateral lower extremities. 1+ pitting edema of the bilateral lower extremities. Musculoskeletal normal gait and posture. no significant deformity or arthritic changes, no loss or range of motion, no clubbing. Psychiatric this patient is able to make decisions and demonstrates good insight into disease process. Alert and Oriented x 3. pleasant and cooperative. Notes Upon inspection patient's wound on the left leg actually appears to be mainly what I would presume to be a hematoma. With that being said the biggest issue here is I do not think it is going to be the right thing to do right now to try to open this up in fact my hope is we may be able to get it to reabsorb. Nonetheless I do believe that the patient would benefit from a  compression wrap going to try to do something that is somewhat snug but not too tight and hopefully this will help with some of that edema control and as well help some of this to reabsorb as far as the blood that appears to be pulled up underneath the skin is concerned. I explained to the patient there is a chance this may open but I am hoping  not. Electronic Signature(s) Signed: 08/12/2021 1:00:58 PM By: Worthy Keeler PA-C Entered By: Worthy Keeler on 08/12/2021 13:00:57 -------------------------------------------------------------------------------- Physician Orders Details Patient Name: Date of Service: Russell Collier, Russell Collier 08/12/2021 8:45 A M Medical Record Number: [id number] Patient Account Number: [id number] Date of Birth/Sex: Treating RN: Jun 03, 1960 (61 y.o. [name] Primary Care Provider: York Ram Other Clinician: Referring Provider: Treating Provider/Extender: Debbora Lacrosse, Janie Morning in Treatment: 0 Verbal / Phone Orders: No Diagnosis Coding ICD-10 Coding Code Description I73.89 Other specified peripheral vascular diseases [phone] Non-pressure chronic ulcer of other part of left lower leg with fat layer exposed I87.333 Chronic venous hypertension (idiopathic) with ulcer and inflammation of bilateral lower extremity E11.622 Type 2 diabetes mellitus with other skin ulcer I25.10 Atherosclerotic heart disease of native coronary artery without angina pectoris Follow-up Appointments ppointment in 1 week. - 08/19/21 @ 8:45 am and 08/26/21 @ 3:30pm with Orland Jarred, RN (Room 7) Return A Other: - Do not use Ibuprofen while on Eliquis Bathing/ Shower/ Hygiene May shower with protection but do not get wound dressing(s) wet. - May shower with cast protector bag from [facility], [facility] or [employer] Edema Control - Lymphedema / SCD / Other Elevate legs to the level of the heart or above for 30 minutes daily and/or when sitting, a frequency of: - throughout the day Avoid standing for long periods of time. Patient to wear own compression stockings every day. - Use 15-69mHg Compression stocking for right leg. Additional Orders / Instructions Stop/Decrease Smoking Follow Nutritious Diet Wound Treatment Wound #2 - Lower Leg Wound Laterality: Left, Anterior Cleanser: Soap and Water 1 x Per Week/30  Days Discharge Instructions: May shower and wash wound with dial antibacterial soap and water prior to dressing change. Cleanser: Wound Cleanser 1 x Per Week/30 Days Discharge Instructions: Cleanse the wound with wound cleanser prior to applying a clean dressing using gauze sponges, not tissue or cotton balls. Peri-Wound Care: Triamcinolone 15 (g) 1 x Per Week/30 Days Discharge Instructions: Use triamcinolone 15 (g) as directed Peri-Wound Care: Sween Lotion (Moisturizing lotion) 1 x Per Week/30 Days Discharge Instructions: Apply moisturizing lotion as directed Secondary Dressing: ABD Pad, 8x10 1 x Per Week/30 Days Discharge Instructions: Apply over primary dressing as directed. Compression Wrap: Kerlix Roll 4.5x3.1 (in/yd) 1 x Per Week/30 Days Discharge Instructions: Apply Kerlix and Coban compression as directed. Compression Wrap: Coban Self-Adherent Wrap 4x5 (in/yd) 1 x Per Week/30 Days Discharge Instructions: Apply over Kerlix as directed. Consults Vascular - Referral to Dr. CDonzetta Matters Low ABI's in office and left lower leg wound/pain - (ICD10 LY69.485- Non-pressure chronic ulcer of other part of left lower leg with fat layer exposed) Electronic Signature(s) Signed: 08/12/2021 4:35:31 PM By: BLorrin JacksonSigned: 08/12/2021 5:09:34 PM By: SWorthy KeelerPA-C Entered By: BLorrin Jacksonon 08/12/2021 09:33:22 Prescription 08/12/2021 -------------------------------------------------------------------------------- TCarmelina DanePA Patient Name: Provider: [id number] of Birth: NPI#:[id number]: DEA #: [phone] #: License #: [place]res[place]Patient [address]  [place] [address] [address] [place], [place] [phone] Allergies Chantix; bupropion; Nicoderm CQ Provider's Orders Vascular - ICD10: L89.373 - Referral to Dr. Donzetta Matters: Low ABI's in office and left  lower leg wound/pain Hand Signature: Date(s): Electronic Signature(s) Signed: 08/12/2021 4:35:31 PM By: Lorrin Jackson Signed: 08/12/2021 5:09:34 PM By: Worthy Keeler PA-C Entered By: Lorrin Jackson on 08/12/2021 09:33:22 -------------------------------------------------------------------------------- Problem List Details Patient Name: Date of Service: Russell Collier, Russell Collier 08/12/2021 8:45 A M Medical Record Number: [id number] Patient Account Number: [id number] Date of Birth/Sex: Treating RN: [birth year] (61 y.o. [name] Primary Care Provider: York Ram Other Clinician: Referring Provider: Treating Provider/Extender: Debbora Lacrosse, Janie Morning in Treatment: 0 Active Problems ICD-10 Encounter Code Description Active Date MDM Diagnosis I73.89 Other specified peripheral vascular diseases 08/12/2021 No Yes L97.822 Non-pressure chronic ulcer of other part of left lower leg with fat layer exposed6/21/2023 No Yes I87.333 Chronic venous hypertension (idiopathic) with ulcer and inflammation of 08/12/2021 No Yes bilateral lower extremity E11.622 Type 2 diabetes mellitus with other skin ulcer 08/12/2021 No Yes I25.10 Atherosclerotic heart disease of native coronary artery without angina pectoris 08/12/2021 No Yes Inactive Problems Resolved Problems Electronic Signature(s) Signed: 08/12/2021 9:02:26 AM By: Worthy Keeler PA-C Entered By: Worthy Keeler on 08/12/2021 09:02:26 -------------------------------------------------------------------------------- Progress Note Details Patient Name: Date of Service: [name], Russell Collier 08/12/2021 8:45 A M Medical Record Number: [id number] Patient Account Number: [id number] Date of Birth/Sex: Treating RN: [birth year] (61 y.o. [name] Primary Care Provider: York Ram Other Clinician: Referring Provider: Treating Provider/Extender: Debbora Lacrosse, Janie Morning in Treatment: 0 Subjective Chief  Complaint Information obtained from Patient Left anterior LE Ulcer History of Present Illness (HPI) ADMISSION 09/14/2018 This is a 61 year old man who traumatized his left anterior tibia area on the bathtub. He describes this initially as a bruise that subsequently reopened. He has been seen in the ER several times and has had at least 3 courses of antibiotics including doxy and Keflex twice. He has been using Neosporin and peroxide on this. Past medical history includes obstructive sleep apnea, cellulitis, hypertension, coronary artery disease, peripheral neuropathy [nondiabetic], currently on a heart murmur for atypical chest pain and bradycardia. 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 ARTERIAL STUDIES done in March 2020 showed an ABI on the left of 0.93, TBI on the left slightly reduced at 0.42. On the right his ABI was 0.95 with a TBI of 0.51 he has monophasic waveforms in the lower extremities bilaterally. Interpreted on the left this showing mild left lower extremity arterial disease as well as on the right. 09/25/18-Patient returns at 1 week to the clinic, noted that arterial studies done in March 2020. We have been using Iodoflex to the wound on the left leg, patient has significant degree of pain and declines to have any debridement 8/21; the patient has not been here in 3 weeks. Since he was last here he was admitted to hospital on 8/4. He was felt to have stable angina. He has since been to his cardiologist. I note that he has an EF of about 48%. He has a venous wound on the left anterior mid tibia area probably with lymphedema. We have been using Iodoflex. He does not tolerate debridement well. He tells me that his cardiologist has been adjusting his Lasix and then the wraps fall down he tries to pull them up. He is describing pain making it difficult for him to function. 9/3; patient is was hospitalized since he  was last here with COPD exacerbation. Came in with Aquacel Ag on the  wound. The wound actually looks improved. 9/17; patient was apparently in the ER again with breathing issues and blood pressure issues. He was not admitted. We are using Sorbact of the wound with nice improvement dimensions are better 10/1; the patient's wound is smaller and appears to have a reasonable healthy surface. We are using Sorbact 10/8; the wound is smaller although there is still an area with depth and necrotic debris at the base. We are using Sorbact 10/15; not much change in the wound area. We have been using Sorbact. 10/22; small wound area using endoform since last week 11/5; Russell Collier arrived today in a healed state. Left leg and wrapped. He still does not have compression stockings Readmission: 08-12-2021 upon evaluation today patient presents for evaluation here in the clinic he has been seen last in 2020 at that point Dr. Dellia Nims was the treating physician. Subsequently the patient tells me that he does have a history of peripheral vascular disease, chronic venous insufficiency, diabetes mellitus type 2, and coronary artery disease. Unfortunately he has had some issues with his right leg he recently has been [facility] and they did perform an arteriogram on the right leg I believe even putting a stent in. With that being said that leg seems to be doing well he mainly just needs a compression sock. In regard to the left leg which is the one in question he tells me the same day he had a surgery he hit the left leg on a piece of furniture on the anterior shin. This caused a area that seems to be a hematoma present at this point. With that being said the patient is having significant discomfort. He tells me. He has been taking ibuprofen which he should not be doing I discussed that with him today. Tylenol is really only should be taken being on the Eliquis. He also tells me that he has not really been put anything on other than a Band-Aid I really think he  may benefit from some light compression I do not want to do anything too tight until we get a good review of his arterial flow as his ABIs are not good we got here in the office today was 0.75 on the left 0.65 on the right and to be honest he did not have palpable pulses that I could find. He does not appear to have had an arterial study since 2020 as noted above by Dr. Dellia Nims. Patient History Information obtained from Patient. Allergies Chantix (Reaction: anaphylaxis), bupropion, Nicoderm CQ Family History Cancer - Father, Diabetes - Father,Paternal Grandparents, Heart Disease - Mother, Hypertension - Mother, Kidney Disease - Maternal Grandparents, Lung Disease - Father, Stroke - Father, No family history of Hereditary Spherocytosis, Seizures, Thyroid Problems, Tuberculosis. 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 Medical History Eyes Denies history of Cataracts, Glaucoma, Optic Neuritis Hematologic/Lymphatic Patient has history of Hemophilia - Borderline Denies history of Anemia, Human Immunodeficiency Virus, Lymphedema, Sickle Cell Disease Respiratory Patient has history of Asthma, Sleep Apnea Denies history of Aspiration, Chronic Obstructive Pulmonary Disease (COPD), Pneumothorax, Tuberculosis Cardiovascular Patient has history of Angina - Atypical, Congestive Heart Failure, Coronary Artery Disease, Hypertension, Peripheral Arterial Disease Denies history of Arrhythmia, Deep Vein Thrombosis, Hypotension, Myocardial Infarction, Peripheral Venous Disease, Phlebitis, Vasculitis  Gastrointestinal Denies history of Cirrhosis , Colitis, Crohnoos, Hepatitis A, Hepatitis B, Hepatitis C Endocrine Patient has history of Type II Diabetes Denies history of Type I Diabetes Genitourinary Denies history of End Stage Renal Disease Immunological Denies history of Lupus  Erythematosus, Raynaudoos, Scleroderma Integumentary (Skin) Denies history of History of Burn Musculoskeletal Patient has history of Osteoarthritis Denies history of Gout, Rheumatoid Arthritis, Osteomyelitis Neurologic Patient has history of Neuropathy Denies history of Dementia, Quadriplegia, Paraplegia, Seizure Disorder Oncologic Denies history of Received Chemotherapy, Received Radiation Psychiatric Denies history of Anorexia/bulimia, Confinement Anxiety Patient is treated with Oral Agents. Blood sugar is not tested. Hospitalization/Surgery History [facility] - Acute Resp Failure. - cardiac cath with stent 12/19/18. 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 Ear/Nose/Mouth/Throat Denies complaints or symptoms of Chronic sinus problems or rhinitis. Gastrointestinal Denies complaints or symptoms of Frequent diarrhea, Nausea, Vomiting. Genitourinary Denies complaints or symptoms of Frequent urination. Integumentary (Skin) Complains or has symptoms of Wounds. Psychiatric Denies complaints or symptoms of Claustrophobia, Suicidal. Objective Constitutional sitting or standing blood pressure is within target range for patient.. pulse regular and within target range for patient.Marland Kitchen respirations regular, non-labored and within target range for patient.Marland Kitchen temperature within target range for patient.. Well-nourished and well-hydrated in no acute distress. Vitals Time Taken: 8:29 AM, Height: 70 in, Source: Stated, Weight: 315 lbs, Source: Stated, BMI: 45.2, Temperature: 99.0 F, Pulse: 73 bpm, Respiratory Rate: 18 breaths/min, Blood Pressure: 123/76 mmHg. Eyes conjunctiva clear no eyelid edema noted. pupils equal round and reactive to light and accommodation. Ears, Nose, Mouth, and Throat no gross  abnormality of ear auricles or external auditory canals. normal hearing noted during conversation. mucus membranes moist. Respiratory normal breathing without difficulty. Cardiovascular Absent posterior tibial and dorsalis pedis pulses bilateral lower extremities. 1+ pitting edema of the bilateral lower extremities. Musculoskeletal normal gait and posture. no significant deformity or arthritic changes, no loss or range of motion, no clubbing. Psychiatric this patient is able to make decisions and demonstrates good insight into disease process. Alert and Oriented x 3. pleasant and cooperative. General Notes: Upon inspection patient's wound on the left leg actually appears to be mainly what I would presume to be a hematoma. With that being said the biggest issue here is I do not think it is going to be the right thing to do right now to try to open this up in fact my hope is we may be able to get it to reabsorb. Nonetheless I do believe that the patient would benefit from a compression wrap going to try to do something that is somewhat snug but not too tight and hopefully this will help with some of that edema control and as well help some of this to reabsorb as far as the blood that appears to be pulled up underneath the skin is concerned. I explained to the patient there is a chance this may open but I am hoping not. Integumentary (Hair, Skin) Wound #2 status is Open. Original cause of wound was Trauma. The date acquired was: 08/03/2021. The wound is located on the Left,Anterior Lower Leg. The wound measures 1.5cm length x 1.5cm width x 0.2cm depth; 1.767cm^2 area and 0.353cm^3 volume. There is Fat Layer (Subcutaneous Tissue) exposed. There is no tunneling or undermining noted. There is a medium amount of serous drainage noted. The wound margin is distinct with the outline attached  to the wound base. There is large (67-100%) red granulation within the wound bed. There is a small (1-33%) amount of  necrotic tissue within the wound bed including Eschar. Assessment Active Problems ICD-10 Other specified peripheral vascular diseases Non-pressure chronic ulcer of other part of left lower leg with fat layer exposed Chronic venous hypertension (idiopathic) with ulcer and inflammation of bilateral lower extremity Type 2 diabetes mellitus with other skin ulcer Atherosclerotic heart disease of native coronary artery without angina pectoris Plan Follow-up Appointments: Return Appointment in 1 week. - 08/19/21 @ 8:45 am and 08/26/21 @ 3:30pm with Orland Jarred, RN (Room 7) Other: - Do not use Ibuprofen while on Eliquis Bathing/ Shower/ Hygiene: May shower with protection but do not get wound dressing(s) wet. - May shower with cast protector bag from [facility], [facility] or [employer] Edema Control - Lymphedema / SCD / Other: Elevate legs to the level of the heart or above for 30 minutes daily and/or when sitting, a frequency of: - throughout the day Avoid standing for long periods of time. Patient to wear own compression stockings every day. - Use 15-38mHg Compression stocking for right leg. Additional Orders / Instructions: Stop/Decrease Smoking Follow Nutritious Diet Consults ordered were: Vascular - Referral to Dr. CDonzetta Matters Low ABI's in office and left lower leg wound/pain WOUND #2: - Lower Leg Wound Laterality: Left, Anterior Cleanser: Soap and Water 1 x Per Week/30 Days Discharge Instructions: May shower and wash wound with dial antibacterial soap and water prior to dressing change. Cleanser: Wound Cleanser 1 x Per Week/30 Days Discharge Instructions: Cleanse the wound with wound cleanser prior to applying a clean dressing using gauze sponges, not tissue or cotton balls. Peri-Wound Care: Triamcinolone 15 (g) 1 x Per Week/30 Days Discharge Instructions: Use triamcinolone 15 (g) as directed Peri-Wound Care: Sween Lotion (Moisturizing lotion) 1 x Per Week/30 Days Discharge Instructions: Apply  moisturizing lotion as directed Secondary Dressing: ABD Pad, 8x10 1 x Per Week/30 Days Discharge Instructions: Apply over primary dressing as directed. Com pression Wrap: Kerlix Roll 4.5x3.1 (in/yd) 1 x Per Week/30 Days Discharge Instructions: Apply Kerlix and Coban compression as directed. Com pression Wrap: Coban Self-Adherent Wrap 4x5 (in/yd) 1 x Per Week/30 Days Discharge Instructions: Apply over Kerlix as directed. 1. I would recommend currently that we go ahead and utilize the compression therapy for the patient currently this is going to include the dressing to the wound which is an ABD pad that really did not need to be any particular dressing other than this. 2. I am also can recommend the roll gauze for a Curlex and Coban wrap be placed this should be somewhat likely would not want it too tight. Also advised the patient if he starts have any significant pain in his lower extremity he should remove this. 3. I am also can recommend that the patient as advised by Dr. DDoren Custardshould get compression socks for the right leg in the 15 to 20 mmHg range that seems to be what was indicated he can tolerate on the right. I think that would be appropriate for him at this point. We will see patient back for reevaluation in 1 week here in the clinic. If anything worsens or changes patient will contact our office for additional recommendations. Electronic Signature(s) Signed: 08/12/2021 1:01:56 PM By: SWorthy KeelerPA-C Entered By: SWorthy Keeleron 08/12/2021 13:01:56 -------------------------------------------------------------------------------- HxROS Details Patient Name: Date of Service: Russell [place] 08/12/2021 8:45 A M Medical Record Number: [id number] Account  Number: [id number] Date of Birth/Sex: Treating RN: [birth year] (61 y.o. [name] Primary Care Provider: York Ram Other Clinician: Referring Provider: Treating Provider/Extender: [place] Service in Treatment: 0 Information Obtained From Patient Eyes Complaints and Symptoms: Positive for: Glasses / Contacts Medical History: Negative for: Cataracts; Glaucoma; Optic Neuritis Ear/Nose/Mouth/Throat Complaints and Symptoms: Negative for: Chronic sinus problems or rhinitis Gastrointestinal Complaints and Symptoms: Negative for: Frequent diarrhea; Nausea; Vomiting Medical History: Negative for: Cirrhosis ; Colitis; Crohns; Hepatitis A; Hepatitis B; Hepatitis C Genitourinary Complaints and Symptoms: Negative for: Frequent urination Medical History: Negative for: End Stage Renal Disease Integumentary (Skin) Complaints and Symptoms: Positive for: Wounds Medical History: Negative for: History of Burn Psychiatric Complaints and Symptoms: Negative for: Claustrophobia; Suicidal Medical History: Negative for: Anorexia/bulimia; Confinement Anxiety Hematologic/Lymphatic Medical History: Positive for: Hemophilia - Borderline Negative for: Anemia; Human Immunodeficiency Virus; Lymphedema; Sickle Cell Disease Respiratory Medical History: Positive for: Asthma; Sleep Apnea Negative for: Aspiration; Chronic Obstructive Pulmonary Disease (COPD); Pneumothorax; Tuberculosis Past Medical History Notes: Pulmonary Embolism Cardiovascular Medical History: Positive for: Angina - Atypical; Congestive Heart Failure; Coronary Artery Disease; Hypertension; Peripheral Arterial Disease Negative for: Arrhythmia; Deep Vein Thrombosis; Hypotension; Myocardial Infarction; Peripheral Venous Disease; Phlebitis; Vasculitis Past Medical History Notes: Hyperlipidemia, Bradycardia, 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 Endocrine Medical History: Positive for: Type II Diabetes Negative for: Type I Diabetes Treated with: Oral agents Blood sugar tested every day: No Immunological Medical History: Negative for: Lupus Erythematosus; Raynauds; Scleroderma Musculoskeletal Medical History: Positive  for: Osteoarthritis Negative for: Gout; Rheumatoid Arthritis; Osteomyelitis Neurologic Medical History: Positive for: Neuropathy Negative for: Dementia; Quadriplegia; Paraplegia; Seizure Disorder Oncologic Medical History: Negative for: Received Chemotherapy; Received Radiation Immunizations Pneumococcal Vaccine: Received Pneumococcal Vaccination: Yes Received Pneumococcal Vaccination On or After 60th Birthday: No Implantable Devices Yes Hospitalization / Surgery History Type of Hospitalization/Surgery [facility] 09[facility]2018 - Acute Resp Failure cardiac cath with stent 12/19/18 Family and Social History Cancer: Yes - Father; Diabetes: Yes - Father,Paternal Grandparents; Heart Disease: Yes - Mother; Hereditary Spherocytosis: No; Hypertension: Yes - Mother; Kidney Disease: Yes - Maternal Grandparents; Lung Disease: Yes - Father; Seizures: No; Stroke: Yes - Father; Thyroid Problems: No; Tuberculosis: No; Current every day smoker - Cigars 1 PPD; Marital Status - Divorced; Alcohol Use: Rarely; Drug Use: Prior History - Cocaine; Caffeine Use: Rarely; Financial Concerns: No; Food, Clothing or Shelter Needs: No; Support System Lacking: No; Transportation Concerns: No Electronic Signature(s) Signed: 08/12/2021 4:35:31 PM By: Lorrin Jackson Signed: 08/12/2021 5:09:34 PM By: Worthy Keeler PA-C Entered By: Lorrin Jackson on 08/12/2021 08:36:04 -------------------------------------------------------------------------------- [place] Details Patient Name: Date of Service: Russell Collier, Russell Collier 08/12/2021 Medical Record Number: [id number] Patient Account Number: [id number] Date of Birth/Sex: Treating RN: October 12, 1960 (61 y.o. [name] Primary Care Provider: York Ram Other Clinician: Referring Provider: Treating Provider/Extender: Debbora Lacrosse, Janie Morning in Treatment: 0 Diagnosis Coding ICD-10 Codes Code Description [phone] Other specified peripheral vascular  diseases [id number] Non-pressure chronic ulcer of other part of left lower leg with fat layer exposed I87.333 Chronic venous hypertension (idiopathic) with ulcer and inflammation of bilateral lower extremity E11.622 Type 2 diabetes mellitus with other skin ulcer I25.10 Atherosclerotic heart disease of native coronary artery without angina pectoris Facility Procedures CPT4 Code: [id number] Description: 99214 - WOUND CARE VISIT-LEV 4 EST PT Modifier: 25 Quantity: 1 Physician Procedures : CPT4 Code Description Modifier [id number] - WC PHYS LEVEL 4 - EST PT ICD-10 Diagnosis Description I73.89 Other specified peripheral vascular diseases L97.822 Non-pressure chronic ulcer of  other part of left lower leg with fat layer exposed I87.333  Chronic venous hypertension (idiopathic) with ulcer and inflammation of bilateral lower extremity E11.622 Type 2 diabetes mellitus with other skin ulcer Quantity: 1 Electronic Signature(s) Signed: 08/12/2021 1:02:23 PM By: Worthy Keeler PA-C Entered By: Worthy Keeler on 08/12/2021 13:02:22

## 2021-08-13 ENCOUNTER — Telehealth: Payer: Self-pay

## 2021-08-13 ENCOUNTER — Other Ambulatory Visit: Payer: Self-pay

## 2021-08-13 DIAGNOSIS — I739 Peripheral vascular disease, unspecified: Secondary | ICD-10-CM

## 2021-08-13 NOTE — Telephone Encounter (Signed)
Called pt to inform him of upcoming appts.

## 2021-08-14 DIAGNOSIS — M5136 Other intervertebral disc degeneration, lumbar region: Secondary | ICD-10-CM | POA: Diagnosis not present

## 2021-08-17 ENCOUNTER — Encounter (HOSPITAL_COMMUNITY): Payer: Self-pay | Admitting: Vascular Surgery

## 2021-08-17 ENCOUNTER — Other Ambulatory Visit: Payer: Self-pay | Admitting: *Deleted

## 2021-08-17 DIAGNOSIS — M5136 Other intervertebral disc degeneration, lumbar region: Secondary | ICD-10-CM | POA: Diagnosis not present

## 2021-08-17 NOTE — Patient Outreach (Signed)
Medicaid Managed Care   Nurse Care Manager Note  08/17/2021 Name:  Russell Collier MRN:  [id number] DOB:  06-14-1960  Russell Collier is an 61 y.o. year old male who is a primary patient of Cresenzo, Cyndi Lennert, MD.  [facility] Managed Care Coordination team was consulted for assistance with:    pain  Russell Collier was given information about Medicaid Managed Care Coordination team services today. Russell Collier Patient agreed to services and verbal consent obtained.  Engaged with patient by telephone for follow up visit in response to provider referral for case management and/or care coordination services.   Assessments/Interventions:  Review of past medical history, allergies, medications, health status, including review of consultants reports, laboratory and other test data, was performed as part of comprehensive evaluation and provision of chronic care management services.  SDOH (Social Determinants of Health) assessments and interventions performed:   Care Plan  Allergies  Allergen Reactions   Chantix [Varenicline] Anaphylaxis, Swelling and Other (See Comments)    Patient reports laryngospasm stopped in the ER, throat swelling   Bupropion Other (See Comments)    Headache - moderate/severe - self discontinued agent    Nicoderm [Nicotine] Rash    Medications Reviewed Today     Reviewed by Heidi Dach, RN (Registered Nurse) on 08/17/21 at 1329  Med List Status: <None>   Medication Order Taking? Sig Documenting Provider Last Dose Status Informant  Accu-Chek Softclix Lancets lancets [id number]  Use as instructed Cresenzo, Cyndi Lennert, MD  Active Self  acetaminophen (TYLENOL) 325 MG tablet [id number] Yes Take 2 tablets (650 mg total) by mouth every 6 (six) hours as needed for mild pain or moderate pain. Darral Dash, DO Taking Active Self  albuterol (PROVENTIL) (2.5 MG/3ML) 0.083% nebulizer solution [id number] Yes Take 3 mLs (2.5 mg total) by nebulization every 6 (six) hours as needed for  wheezing or shortness of breath. Celedonio Savage, MD Taking Active Self  albuterol (VENTOLIN HFA) 108 (90 Base) MCG/ACT inhaler [id number] Yes Inhale 2 puffs into the lungs every 6 (six) hours as needed for wheezing or shortness of breath. Celedonio Savage, MD Taking Active Self  ANORO ELLIPTA 62.5-25 MCG/ACT AEPB [id number] Yes INHALE 1 PUFF INTO THE LUNGS DAILY AS NEEDED.  Patient taking differently: Inhale 1 puff into the lungs daily as needed (for "flares").   Celedonio Savage, MD Taking Active Self  apixaban (ELIQUIS) 2.5 MG TABS tablet [id number] Yes Take 1 tablet (2.5 mg total) by mouth 2 (two) times daily. Celedonio Savage, MD Taking Active Self  atorvastatin (LIPITOR) 80 MG tablet [id number] Yes Take 1 tablet (80 mg total) by mouth daily. Celedonio Savage, MD Taking Active Self  Blood Glucose Monitoring Suppl (ACCU-CHEK GUIDE) w/Device KIT [id number]  1 Units by Does not apply route daily.  Patient not taking: Reported on 08/10/2021   Celedonio Savage, MD  Active Self           Med Note (Chereese Cilento A   Mon Aug 10, 2021  2:12 PM) Has not received from pharmacy  doxycycline (VIBRA-TABS) 100 MG tablet [id number] No Take 1 tablet (100 mg total) by mouth every 12 (twelve) hours.  Patient not taking: Reported on 07/20/2021   Darral Dash, DO Not Taking Active Self  enalapril (VASOTEC) 20 MG tablet [id number] Yes Take 1 tablet (20 mg total) by mouth at bedtime. Celedonio Savage, MD Taking Active Self  FARXIGA 10 MG TABS tablet [id number] Yes TAKE 1 TABLET (10 MG TOTAL) BY  MOUTH DAILY BEFORE BREAKFAST.  Patient taking differently: Take 10 mg by mouth daily.   Celedonio Savage, MD Taking Active Self  fluticasone [facility]) 50 MCG/ACT nasal spray [id number] Yes Place 1 spray into both nostrils daily.  Patient taking differently: Place 1 spray into both nostrils daily as needed for allergies.   Celedonio Savage, MD Taking Active Self  furosemide (LASIX) 40 MG tablet [id number] Yes Take 1.5 tablets (60  mg total) by mouth 2 (two) times daily. Robbie Lis M, PA-C Taking Active Self  glucose blood (ACCU-CHEK GUIDE) test strip [id number] No Use as instructed  Patient not taking: Reported on 08/17/2021   Celedonio Savage, MD Not Taking Active Self  ibuprofen (ADVIL) 200 MG tablet [id number] Yes Take 600 mg by mouth every 6 (six) hours as needed for headache or moderate pain. [provider] Taking Active Self  isosorbide mononitrate (IMDUR) 30 MG 24 hr tablet [id number] Yes Take 1 tablet (30 mg total) by mouth daily. Celedonio Savage, MD Taking Active Self  multivitamin (ONE-A-DAY MEN'S) TABS tablet [id number] Yes Take 1 tablet by mouth daily with breakfast. [provider] Taking Active Self  pantoprazole (PROTONIX) 40 MG tablet [id number] Yes TAKE 1 TABLET (40 MG TOTAL) BY MOUTH DAILY.  Patient taking differently: Take 40 mg by mouth daily.   Celedonio Savage, MD Taking Active Self  potassium chloride SA (KLOR-CON M) 20 MEQ tablet [id number] Yes Take 2 tablets (40 mEq total) by mouth daily. Allayne Butcher, PA-C Taking Active Self           Med Note Stoney Bang   Tue Jul 14, 2021  1:44 PM)    pregabalin (LYRICA) 100 MG capsule [id number] Yes TAKE ONE CAPSULE BY MOUTH THREE TIMES DAILY  Patient taking differently: Take 100 mg by mouth 3 (three) times daily.   Celedonio Savage, MD Taking Active Self  Tafamidis [facility]) 61 MG CAPS [id number] Yes TAKE 1 CAPSULE BY MOUTH DAILY.  Patient taking differently: Take 61 mg by mouth daily.   [name], Bevelyn Buckles, MD Taking Active Self  traMADol (ULTRAM) 50 MG tablet [id number] No Take 1 tablet (50 mg total) by mouth every 6 (six) hours as needed.  Patient not taking: Reported on 08/10/2021   Shon Baton, MD Not Taking Active Self  triamcinolone ointment (KENALOG) 0.1 % [id number] Yes Apply 1 application topically 2 (two) times daily.  Patient taking differently: Apply 1 application  topically 2 (two) times daily as needed  (for itching).   Maury Dus, MD Taking Active Self  valACYclovir (VALTREX) 500 MG tablet [id number] Yes Take 1 tablet (500 mg total) by mouth daily. Celedonio Savage, MD Taking Active Self           Med Note Antony Madura, Dyane Dustman Jun 28, 2021  9:45 PM) ONGOING THERAPY  Vitamin A 2400 MCG (8000 UT) TABS [id number] Yes Take 1 tablet by mouth daily.  Patient taking differently: Take 8,000 Units by mouth daily.   [name], Bevelyn Buckles, MD Taking Active Self           Med Note Antony Madura, Dyane Dustman Jun 28, 2021  9:38 PM)    vutrisiran sodium (AMVUTTRA) 25 MG/0.5ML syringe [id number] Yes Inject 0.5 mLs (25 mg total) into the skin every 3 (three) months. [name], Bevelyn Buckles, MD Taking Active Self            Patient Active Problem List   Diagnosis Date Noted  Pulmonary hypertension, primary (HCC)    Cellulitis 07/09/2021   Cellulitis of right lower extremity 07/04/2021   Urinary tract infection without hematuria    Sepsis (HCC) 06/28/2021   Community acquired pneumonia    Gout 02/26/2021   Skin tag 02/12/2021   Skin cyst 02/12/2021   Anticoagulant long-term use 01/29/2021   Dermoid cyst of skin of nose 12/24/2020   Strain of right calf muscle 07/01/2020   Status post lumbar spinal fusion 06/06/2020   Impaired mobility and ADLs 04/23/2020   Midline low back pain 04/23/2020   Neuropathy, 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   COPD exacerbation (HCC) 03/20/2020   Type 2 diabetes mellitus with diabetic peripheral angiopathy without gangrene, without long-term current use of insulin (HCC) 03/20/2020   Coagulation defect (HCC) 12/11/2019   Fatigue 12/10/2019   Leg cramps 12/10/2019   Frequent epistaxis 10/30/2019   Disc degeneration, 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   Ganglion of right knee 09/27/2019   Lumbar radiculopathy 09/27/2019   Lower extremity edema    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HCC)    Respiratory failure (HCC) 07/27/2019   Primary osteoarthritis of right knee 07/24/2019   Breast tenderness in male  05/31/2019   Chronic pain 05/31/2019   AKI (acute kidney injury) (HCC) 05/31/2019   Pain due to onychomycosis of toenails of both feet 05/16/2019   Chronic knee pain 04/16/2019   Diabetes (HCC) 04/16/2019   Esophageal reflux 02/06/2019   Heartburn 02/06/2019   Chronic skin ulcer of lower leg (HCC) 12/21/2018   S/P drug eluting coronary stent placement    Coronary artery disease involving native coronary artery of native heart with unstable angina pectoris (HCC) 12/20/2018   Unstable angina (HCC)    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   Acute on chronic diastolic heart failure (HCC)    Shortness of breath    Precordial chest pain    Idiopathic peripheral neuropathy    Palpitations    Chronic diastolic heart failure (HCC)    Abnormal ankle brachial index (ABI)    Chest pain, rule out acute myocardial infarction 09/26/2018   History of cocaine abuse (HCC) 08/20/2018   Marijuana use 08/20/2018   Morbid obesity with BMI of 40.0-44.9, adult (HCC) 08/20/2018   Dyspepsia    Morbid obesity (HCC)    OSA on CPAP    Chest pain 05/30/2018   CAD (coronary artery disease) 03/30/2018   Elevated troponin 02/03/2018   Positive urine drug screen 02/03/2018   Tobacco abuse 02/03/2018   Hyperlipidemia 02/03/2018   Acute respiratory failure with hypoxia (HCC) 02/02/2018   Chronic congestive heart failure (HCC)    Keratoconjunctivitis sicca of left eye not specified as Sjogren's 09/06/2017   Long term current use of oral hypoglycemic drug 09/06/2017   Mechanical ectropion of left lower eyelid 09/06/2017   Nuclear sclerotic cataract of both eyes 09/06/2017   Refractive amblyopia, left 09/06/2017   Type 2 diabetes mellitus without complication, without long-term current use of insulin (HCC) 09/06/2017   Hyperopia of both eyes with astigmatism and presbyopia 09/06/2017   Atypical chest pain 12/27/2015   Essential hypertension    Neuropathy     Conditions to be addressed/monitored per PCP order:    pain  Care Plan : RN Care Manager Plan of Care  Updates made by Heidi Dach, RN since 08/17/2021 12:00 AM     Problem: Health Management Needs Related to Pain      Long-Range Goal: Development of Plan of Care to address health management needs related to pain   Start Date:  02/05/2021  Expected End Date: 10/22/2021  Priority: High  Note:   Current Barriers:  Knowledge Deficits related to plan of care for management of new right foot pain and numbness Mr. Spratley has been referred to Wound Clinic for right lower leg and has follow up on 08/19/21. Transportation has been arranged. He reports worsening eczema and plans to follow up with PCP to discuss dermatology referral. He would like to continue PT, but will have to wait for his leg would to heal.  RNCM Clinical Goal(s):  Patient will verbalize understanding of plan for management of pain as evidenced by patient verbalization of self monitoring activities take all medications exactly as prescribed and will call provider for medication related questions as evidenced by documentation in EMR    attend all scheduled medical appointments: 6/28 for Wound Care, VVS and Plastic Surgeon and 7/5 for Wound Care and 7/6 with Pharmacist as evidenced by provider documentation in EMR        through collaboration with RN Care manager, provider, and care team.  Interventions: Inter-disciplinary care team collaboration (see longitudinal plan of care) Evaluation of current treatment plan related to  self management and patient's adherence to plan as established by provider Provided education on eczema via MyChart Discussed management of eczema   Pain:  (Status: Goal on Track (progressing): YES.) Long Term Goal  Pain assessment performed Medications reviewed-discussed avoiding ibuprofen, advised taking tylenol for pain Discussed importance of adherence to all scheduled medical appointments; Counseled on the importance of reporting any/all new or changed  pain symptoms or management strategies to pain management provider; Advised patient to report to care team affect of pain on daily activities; Reviewed with patient prescribed pharmacological and nonpharmacological pain relief strategies; Provided education on cellulitis  Patient Goals/Self-Care Activities: Take medications as prescribed   Attend all scheduled provider appointments Call pharmacy for medication refills 3-7 days in advance of running out of medications Call provider office for new concerns or questions        Follow Up:  Patient agrees to Care Plan and Follow-up.  Plan: The Managed Medicaid care management team will reach out to the patient again over the next 60 days.  Date/time of next scheduled RN care management/care coordination outreach:  10/19/21 @ 1:15pm  Estanislado Emms RN, BSN   [employer]

## 2021-08-18 ENCOUNTER — Encounter (HOSPITAL_COMMUNITY): Payer: Self-pay | Admitting: Vascular Surgery

## 2021-08-18 DIAGNOSIS — M5136 Other intervertebral disc degeneration, lumbar region: Secondary | ICD-10-CM | POA: Diagnosis not present

## 2021-08-19 ENCOUNTER — Ambulatory Visit (INDEPENDENT_AMBULATORY_CARE_PROVIDER_SITE_OTHER): Payer: Medicaid Other | Admitting: Vascular Surgery

## 2021-08-19 ENCOUNTER — Ambulatory Visit: Payer: Medicaid Other | Admitting: Vascular Surgery

## 2021-08-19 ENCOUNTER — Encounter (HOSPITAL_BASED_OUTPATIENT_CLINIC_OR_DEPARTMENT_OTHER): Payer: Medicaid Other | Admitting: Physician Assistant

## 2021-08-19 ENCOUNTER — Ambulatory Visit (HOSPITAL_COMMUNITY)
Admission: RE | Admit: 2021-08-19 | Discharge: 2021-08-19 | Disposition: A | Discharge status: Home / Self Care | Payer: Medicaid Other | Source: Ambulatory Visit | Attending: Vascular Surgery | Admitting: Vascular Surgery

## 2021-08-19 ENCOUNTER — Encounter: Payer: Self-pay | Admitting: Vascular Surgery

## 2021-08-19 ENCOUNTER — Ambulatory Visit: Payer: Medicaid Other | Admitting: Student

## 2021-08-19 VITALS — BP 125/73 | HR 79 | Temp 98.0°F | Resp 20 | Ht 70.5 in | Wt 313.0 lb

## 2021-08-19 DIAGNOSIS — L97822 Non-pressure chronic ulcer of other part of left lower leg with fat layer exposed: Secondary | ICD-10-CM | POA: Diagnosis not present

## 2021-08-19 DIAGNOSIS — L720 Epidermal cyst: Secondary | ICD-10-CM | POA: Diagnosis not present

## 2021-08-19 DIAGNOSIS — M5136 Other intervertebral disc degeneration, lumbar region: Secondary | ICD-10-CM | POA: Diagnosis not present

## 2021-08-19 DIAGNOSIS — I7389 Other specified peripheral vascular diseases: Secondary | ICD-10-CM | POA: Diagnosis not present

## 2021-08-19 DIAGNOSIS — I739 Peripheral vascular disease, unspecified: Secondary | ICD-10-CM

## 2021-08-19 DIAGNOSIS — L723 Sebaceous cyst: Secondary | ICD-10-CM
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
## 2021-08-19 NOTE — Progress Notes (Addendum)
   Referring Provider Concepcion Living, MD [address],  [place] [address]   CC:  Chief Complaint  Patient presents with   Follow-up      Russell Collier is an 61 y.o. male.  HPI:  Patient is a 61 year old male with history of cystic lesion to his left nose.  Patient underwent excision of the skin lesion on 07/02/2021 with Dr. Claudia Desanctis.  Surgical pathology showed epidermal inclusion cyst.  Patient was last seen in the clinic on 07/16/2021.  At this visit, patient was doing well.  The left nasal incision was intact and healing well.  There were no surrounding erythema or cellulitic changes.  Today, patient reports he is doing okay.  Patient states that ever since surgery he has difficulty breathing out of his left nare.  He reports that he has to manually move his nose to open up the left side. He denies any issues on the right side.  He denies any issues with the incision site.  He denies any drainage.  He denies any fevers or chills.  Review of Systems General: No fevers or chills  Physical Exam    08/19/2021   11:41 AM 08/03/2021   12:00 PM 08/03/2021   11:29 AM  Vitals with BMI  Height 5' 10.5"    Weight 313 lbs    BMI 91.47    Systolic 829 562 130  Diastolic 73 60 73  Pulse 79 60 59    General:  No acute distress,  Alert and oriented, Non-Toxic, Normal speech and affect Respiratory: unlabored breathing, no respiratory distress On exam, patient is sitting upright in no acute distress.  Incision to left nose appears to be healed.  There is no erythema or drainage. Nares appear patent bilaterally. There does not appear to be any obstruction in the left nare.   Assessment/Plan Discussed with patient that his symptoms could be attributed to postop swelling.  Discussed with patient that he should give it a little bit more time and to monitor to see if there is any improvement.  I discussed with patient that if he does not see improvement within the next few months that he should  call us back.  Discussed with patient to call us with any questions or concerns.  Patient to follow up as needed.   Pictures were obtained and placed in the patient's chart with the patient's permission.  Objective findings and plan discussed with Dr. Arlyss Gandy 08/19/2021, 3:03 PM

## 2021-08-19 NOTE — Progress Notes (Addendum)
PURNELL, Russell Collier ([id number]) Visit Report for 08/19/2021 Chief Complaint Document Details Patient Name: Date of Service: Russell Collier 08/19/2021 8:45 A M Medical Record Number: [id number] Patient Account Number: [id number] Date of Birth/Sex: Treating RN: [birth year] (61 y.o. Russell Collier Primary Care Provider: York Ram Other Clinician: Referring Provider: Treating Provider/Extender: Russell Collier, Janie Morning in Treatment: 1 Information Obtained from: Patient Chief Complaint Left anterior LE Ulcer Electronic Signature(s) Signed: 08/19/2021 8:56:28 AM By: Worthy Keeler PA-C Entered By: Worthy Keeler on 08/19/2021 08:56:28 -------------------------------------------------------------------------------- HPI Details Patient Name: Date of Service: Russell Collier 08/19/2021 8:45 A M Medical Record Number: [id number] Patient Account Number: [id number] Date of Birth/Sex: Treating RN: May 05, 1960 (61 y.o. Russell Collier Primary Care Provider: York Ram Other Clinician: Referring Provider: Treating Provider/Extender: Russell Collier, Janie Morning in Treatment: 1 History of Present Illness HPI Description: ADMISSION 09/14/2018 This is a 61 year old man who traumatized his left anterior tibia area on the bathtub. He describes this initially as a bruise that subsequently reopened. He has been seen in the ER several times and has had at least 3 courses of antibiotics including doxy and Keflex twice. He has been using Neosporin and peroxide on this. Past medical history includes obstructive sleep apnea, cellulitis, hypertension, coronary artery disease, peripheral neuropathy [nondiabetic], currently on a heart murmur for atypical chest pain and bradycardia. 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 ARTERIAL STUDIES done in March 2020 showed an ABI on the left of 0.93, TBI on the left slightly reduced at 0.42. On the right his ABI was 0.95 with a  TBI of 0.51 he has monophasic waveforms in the lower extremities bilaterally. Interpreted on the left this showing mild left lower extremity arterial disease as well as on the right. 09/25/18-Patient returns at 1 week to the clinic, noted that arterial studies done in March 2020. We have been using Iodoflex to the wound on the left leg, patient has significant degree of pain and declines to have any debridement 8/21; the patient has not been here in 3 weeks. Since he was last here he was admitted to hospital on 8/4. He was felt to have stable angina. He has since been to his cardiologist. I note that he has an EF of about 48%. He has a venous wound on the left anterior mid tibia area probably with lymphedema. We have been using Iodoflex. He does not tolerate debridement well. He tells me that his cardiologist has been adjusting his Lasix and then the wraps fall down he tries to pull them up. He is describing pain making it difficult for him to function. 9/3; patient is was hospitalized since he was last here with COPD exacerbation. Came in with Aquacel Ag on the wound. The wound actually looks improved. 9/17; patient was apparently in the ER again with breathing issues and blood pressure issues. He was not admitted. We are using Sorbact of the wound with nice improvement dimensions are better 10/1; the patient's wound is smaller and appears to have a reasonable healthy surface. We are using Sorbact 10/8; the wound is smaller although there is still an area with depth and necrotic debris at the base. We are using Sorbact 10/15; not much change in the wound area. We have been using Sorbact. 10/22; small wound area using endoform since last week 11/5; Russell Collier arrived today in a healed state. Left leg and wrapped. He still does not have compression stockings Readmission: 08-12-2021 upon evaluation today patient  presents for evaluation here in the clinic he has been seen last in 2020 at that point Dr.  Dellia Nims was the treating physician. Subsequently the patient tells me that he does have a history of peripheral vascular disease, chronic venous insufficiency, diabetes mellitus type 2, and coronary artery disease. Unfortunately he has had some issues with his right leg he recently has been seen [facility] and they did perform an arteriogram on the right leg I believe even putting a stent in. With that being said that leg seems to be doing well he mainly just needs a compression sock. In regard to the left leg which is the one in question he tells me the same day he had a surgery he hit the left leg on a piece of furniture on the anterior shin. This caused a area that seems to be a hematoma present at this point. With that being said the patient is having significant discomfort. He tells me. He has been taking ibuprofen which he should not be doing I discussed that with him today. Tylenol is really only should be taken being on the Eliquis. He also tells me that he has not really been put anything on other than a Band-Aid I really think he may benefit from some light compression I do not want to do anything too tight until we get a good review of his arterial flow as his ABIs are not good we got here in the office today was 0.75 on the left 0.65 on the right and to be honest he did not have palpable pulses that I could find. He does not appear to have had an arterial study since 2020 as noted above by Dr. Dellia Nims. 08-19-2021 upon evaluation today patient appears to be doing okay in regard to his wound although he does appear to have some fluid buildup underneath this area. Again I am a little reluctant to try to open this up right now due to the fact that the patient does not appear to have good blood flow based on our initial inspection during the clinic evaluation today. Again he does see his vascular surgeon today. We did not have any palpable pulses that we could find last time  I saw him and overall I think that he probably just needs to be seen in order to evaluate and see where things stand. Electronic Signature(s) Signed: 08/19/2021 9:15:36 AM By: Worthy Keeler PA-C Entered By: Worthy Keeler on 08/19/2021 09:15:36 -------------------------------------------------------------------------------- Physical Exam Details Patient Name: Date of Service: Russell Collier 08/19/2021 8:45 A M Medical Record Number: [id number] Patient Account Number: [id number] Date of Birth/Sex: Treating RN: April 23, 1960 (61 y.o. Russell Collier Primary Care Provider: York Ram Other Clinician: Referring Provider: Treating Provider/Extender: [place] [facility] in Treatment: 1 Constitutional Chronically ill appearing but in no apparent acute distress. Respiratory normal breathing without difficulty. Psychiatric this patient is able to make decisions and demonstrates good insight into disease process. Alert and Oriented x 3. pleasant and cooperative. Notes Upon inspection patient's wound bed actually showed signs of a fluid-filled pocket that is definitely enlarged compared to last week. I do believe he is going require an evacuation of this either skin open on its own or he is going to have to have me do it next week. The biggest thing is I want to make sure he has good blood flow before we go down that road. He voiced understanding and he  does have ABIs today as well as seeing the vascular surgeon as well which is good news. The sooner we get this taken care of the better. Electronic Signature(s) Signed: 08/19/2021 9:16:10 AM By: Worthy Keeler PA-C Entered By: Worthy Keeler on 08/19/2021 09:16:09 -------------------------------------------------------------------------------- Physician Orders Details Patient Name: Date of Service: [place], [place] Collier 08/19/2021 8:45 A M Medical Record Number: [id number] Patient Account Number: [id number] Date of  Birth/Sex: Treating RN: [birth year] (61 y.o. Russell Collier Primary Care Provider: York Ram Other Clinician: Referring Provider: Treating Provider/Extender: Russell Collier, Janie Morning in Treatment: 1 Verbal / Phone Orders: No Diagnosis Coding ICD-10 Coding Code Description I73.89 Other specified peripheral vascular diseases [phone] Non-pressure chronic ulcer of other part of left lower leg with fat layer exposed I87.333 Chronic venous hypertension (idiopathic) with ulcer and inflammation of bilateral lower extremity E11.622 Type 2 diabetes mellitus with other skin ulcer I25.10 Atherosclerotic heart disease of native coronary artery without angina pectoris Follow-up Appointments ppointment in 1 week. - 08/26/21 @ 3:30pm and 09/02/21 @ 10:15am with Orland Jarred, RN (Room 7) Return A Other: - -Prescription for Bactrim sent to pharmacy, take as directed. -Do not use Ibuprofen while on Eliquis Bathing/ Shower/ Hygiene May shower and wash wound with soap and water. - Use antibacterial soap, do not use peroxide or alcohol Edema Control - Lymphedema / SCD / Other Elevate legs to the level of the heart or above for 30 minutes daily and/or when sitting, a frequency of: - throughout the day Avoid standing for long periods of time. Patient to wear own compression stockings every day. - Use 15-95mHg Compression stocking for right leg. Additional Orders / Instructions Stop/Decrease Smoking Follow Nutritious Diet Wound Treatment Wound #2 - Lower Leg Wound Laterality: Left, Anterior Cleanser: Soap and Water 1 x Per Week/30 Days Discharge Instructions: May shower and wash wound with dial antibacterial soap and water prior to dressing change. Cleanser: Wound Cleanser 1 x Per Week/30 Days Discharge Instructions: Cleanse the wound with wound cleanser prior to applying a clean dressing using gauze sponges, not tissue or cotton balls. Prim Dressing: KerraCel Ag Gelling Fiber  Dressing, 2x2 in (silver alginate) 1 x Per Week/30 Days ary Discharge Instructions: Apply silver alginate to wound bed as instructed Secondary Dressing: ABD Pad, 8x10 1 x Per Week/30 Days Discharge Instructions: Apply over primary dressing as directed. Compression Wrap: Kerlix Roll 4.5x3.1 (in/yd) 1 x Per Week/30 Days Discharge Instructions: Apply Kerlix and Coban compression as directed. Compression Wrap: ACE Wrap 1 x Per Week/30 Days Patient Medications llergies: Chantix, bupropion, Nicoderm CQ A Notifications Medication Indication Start End 08/19/2021 Bactrim DS DOSE 1 - oral 800 mg-160 mg tablet - 1 tablet oral taken 2 times per day for 14 days. Do not take you potassium pill while on this medication Electronic Signature(s) Signed: 08/19/2021 9:17:38 AM By: SWorthy KeelerPA-C Entered By: SWorthy Keeleron 08/19/2021 09:17:38 -------------------------------------------------------------------------------- Problem List Details Patient Name: Date of Service[place] 08/19/2021 8:45 A M Medical Record Number: [id number] Account Number: [id number] of Birth/Sex: Treating RN: [id number](61y.o. 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/21/2023 No Yes I87.333 Chronic venous hypertension (  idiopathic) with ulcer and inflammation of 08/12/2021 No Yes bilateral lower extremity E11.622 Type 2 diabetes mellitus with other skin ulcer 08/12/2021 No Yes I25.10 Atherosclerotic heart disease of native coronary artery without angina pectoris 08/12/2021 No Yes Inactive Problems Resolved  Problems Electronic Signature(s) Signed: 08/19/2021 8:56:12 AM By: Worthy Keeler PA-C Entered By: Worthy Keeler on 08/19/2021 08:56:11 -------------------------------------------------------------------------------- Progress Note Details Patient Name: Date of Service: [name], [place] Collier 08/19/2021 8:45 A M Medical Record Number: [id number] Patient Account Number: [id number] Date of Birth/Sex: Treating RN: [birth year] (61 y.o. Russell Collier Primary Care Provider: York Ram Other Clinician: Referring Provider: Treating Provider/Extender: Russell Collier, Janie Morning in Treatment: 1 Subjective Chief Complaint Information obtained from Patient Left anterior LE Ulcer History of Present Illness (HPI) ADMISSION 09/14/2018 This is a 61 year old man who traumatized his left anterior tibia area on the bathtub. He describes this initially as a bruise that subsequently reopened. He has been seen in the ER several times and has had at least 3 courses of antibiotics including doxy and Keflex twice. He has been using Neosporin and peroxide on this. Past medical history includes obstructive sleep apnea, cellulitis, hypertension, coronary artery disease, peripheral neuropathy [nondiabetic], currently on a heart murmur for atypical chest pain and bradycardia. 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 ARTERIAL STUDIES done in March 2020 showed an ABI on the left of 0.93, TBI on the left slightly reduced at 0.42. On the right his ABI was 0.95 with a TBI of 0.51 he has monophasic waveforms in the lower extremities bilaterally. Interpreted on the left this showing mild left lower extremity arterial disease as well as on the right. 09/25/18-Patient returns at 1 week to the clinic, noted that arterial studies done in March 2020. We have been using Iodoflex to the wound on the left leg, patient has significant degree of pain and declines to have any debridement 8/21; the patient has not been  here in 3 weeks. Since he was last here he was admitted to hospital on 8/4. He was felt to have stable angina. He has since been to his cardiologist. I note that he has an EF of about 48%. He has a venous wound on the left anterior mid tibia area probably with lymphedema. We have been using Iodoflex. He does not tolerate debridement well. He tells me that his cardiologist has been adjusting his Lasix and then the wraps fall down he tries to pull them up. He is describing pain making it difficult for him to function. 9/3; patient is was hospitalized since he was last here with COPD exacerbation. Came in with Aquacel Ag on the wound. The wound actually looks improved. 9/17; patient was apparently in the ER again with breathing issues and blood pressure issues. He was not admitted. We are using Sorbact of the wound with nice improvement dimensions are better 10/1; the patient's wound is smaller and appears to have a reasonable healthy surface. We are using Sorbact 10/8; the wound is smaller although there is still an area with depth and necrotic debris at the base. We are using Sorbact 10/15; not much change in the wound area. We have been using Sorbact. 10/22; small wound area using endoform since last week 11/5; Mr. Kujawa arrived today in a healed state. Left leg and wrapped. He still does not have compression stockings Readmission: 08-12-2021 upon evaluation today patient presents for evaluation here in the clinic he has been seen last in 2020 at that point Dr. Dellia Nims was  the treating physician. Subsequently the patient tells me that he does have a history of peripheral vascular disease, chronic venous insufficiency, diabetes mellitus type 2, and coronary artery disease. Unfortunately he has had some issues with his right leg he recently has been seen by [facility] and they did perform an arteriogram on the right leg I believe even putting a stent in. With that being said that  leg seems to be doing well he mainly just needs a compression sock. In regard to the left leg which is the one in question he tells me the same day he had a surgery he hit the left leg on a piece of furniture on the anterior shin. This caused a area that seems to be a hematoma present at this point. With that being said the patient is having significant discomfort. He tells me. He has been taking ibuprofen which he should not be doing I discussed that with him today. Tylenol is really only should be taken being on the Eliquis. He also tells me that he has not really been put anything on other than a Band-Aid I really think he may benefit from some light compression I do not want to do anything too tight until we get a good review of his arterial flow as his ABIs are not good we got here in the office today was 0.75 on the left 0.65 on the right and to be honest he did not have palpable pulses that I could find. He does not appear to have had an arterial study since 2020 as noted above by Dr. Dellia Nims. 08-19-2021 upon evaluation today patient appears to be doing okay in regard to his wound although he does appear to have some fluid buildup underneath this area. Again I am a little reluctant to try to open this up right now due to the fact that the patient does not appear to have good blood flow based on our initial inspection during the clinic evaluation today. Again he does see his vascular surgeon today. We did not have any palpable pulses that we could find last time I saw him and overall I think that he probably just needs to be seen in order to evaluate and see where things stand. Objective Constitutional Chronically ill appearing but in no apparent acute distress. Vitals Time Taken: 8:37 AM, Height: 70 in, Weight: 315 lbs, BMI: 45.2, Temperature: 98.1 F, Pulse: 61 bpm, Respiratory Rate: 19 breaths/min, Blood Pressure: 115/67 mmHg. Respiratory normal breathing without  difficulty. Psychiatric this patient is able to make decisions and demonstrates good insight into disease process. Alert and Oriented x 3. pleasant and cooperative. General Notes: Upon inspection patient's wound bed actually showed signs of a fluid-filled pocket that is definitely enlarged compared to last week. I do believe he is going require an evacuation of this either skin open on its own or he is going to have to have me do it next week. The biggest thing is I want to make sure he has good blood flow before we go down that road. He voiced understanding and he does have ABIs today as well as seeing the vascular surgeon as well which is good news. The sooner we get this taken care of the better. Integumentary (Hair, Skin) Wound #2 status is Open. Original cause of wound was Trauma. The date acquired was: 08/03/2021. The wound has been in treatment 1 weeks. The wound is located on the Left,Anterior Lower Leg. The wound measures 0.5cm  length x 0.3cm width x 0.1cm depth; 0.118cm^2 area and 0.012cm^3 volume. There is Fat Layer (Subcutaneous Tissue) exposed. There is no tunneling or undermining noted. There is a medium amount of serous drainage noted. The wound margin is distinct with the outline attached to the wound base. There is large (67-100%) red granulation within the wound bed. There is a small (1-33%) amount of necrotic tissue within the wound bed including Eschar. Assessment Active Problems ICD-10 Other specified peripheral vascular diseases Non-pressure chronic ulcer of other part of left lower leg with fat layer exposed Chronic venous hypertension (idiopathic) with ulcer and inflammation of bilateral lower extremity Type 2 diabetes mellitus with other skin ulcer Atherosclerotic heart disease of native coronary artery without angina pectoris Plan Follow-up Appointments: Return Appointment in 1 week. - 08/26/21 @ 3:30pm and 09/02/21 @ 10:15am with Orland Jarred, RN (Room 7) Other: -  -Prescription for Bactrim sent to pharmacy, take as directed. -Do not use Ibuprofen while on Eliquis Bathing/ Shower/ Hygiene: May shower and wash wound with soap and water. - Use antibacterial soap, do not use peroxide or alcohol Edema Control - Lymphedema / SCD / Other: Elevate legs to the level of the heart or above for 30 minutes daily and/or when sitting, a frequency of: - throughout the day Avoid standing for long periods of time. Patient to wear own compression stockings every day. - Use 15-20mHg Compression stocking for right leg. Additional Orders / Instructions: Stop/Decrease Smoking Follow Nutritious Diet The following medication(s) was prescribed: Bactrim DS oral 800 mg-160 mg tablet 1 1 tablet oral taken 2 times per day for 14 days. Do not take you potassium pill while on this medication starting 08/19/2021 WOUND #2: - Lower Leg Wound Laterality: Left, Anterior Cleanser: Soap and Water 1 x Per Week/30 Days Discharge Instructions: May shower and wash wound with dial antibacterial soap and water prior to dressing change. Cleanser: Wound Cleanser 1 x Per Week/30 Days Discharge Instructions: Cleanse the wound with wound cleanser prior to applying a clean dressing using gauze sponges, not tissue or cotton balls. Prim Dressing: KerraCel Ag Gelling Fiber Dressing, 2x2 in (silver alginate) 1 x Per Week/30 Days ary Discharge Instructions: Apply silver alginate to wound bed as instructed Secondary Dressing: ABD Pad, 8x10 1 x Per Week/30 Days Discharge Instructions: Apply over primary dressing as directed. Com pression Wrap: Kerlix Roll 4.5x3.1 (in/yd) 1 x Per Week/30 Days Discharge Instructions: Apply Kerlix and Coban compression as directed. Com pression Wrap: ACE Wrap 1 x Per Week/30 Days 1. I am get a go ahead and send in a prescription for an antibiotic for him just to be on the safe side based on what I am seeing. This is good to be Bactrim DS which I will send to his pharmacy he  was appreciative of this as well. 2. I am also can recommend that we continue using a dressing would have silver alginate over the top of this and then subsequently have the patient cover the alginate with an ABD pad and then an Ace wrap to secure in place. 3. I am also can recommend he should continue to keep this covered is much as he can we tried doing a Kerlix and Coban wrap last week but it sounds like the patient used scissors to cut this off and I am more concerned about him causing additional wounds especially with what we are seeing going on right now then I am the swelling therefore we will just use the Ace wrap for  now which can be adjusted as needed. 4. With regard to the antibiotic with the Bactrim I am sending him there is an increased risk for hyperkalemia and therefore Georgina Peer have him not take his potassium pills as supplement for the time being. We will see patient back for reevaluation in 1 week here in the clinic. If anything worsens or changes patient will contact our office for additional recommendations. Electronic Signature(s) Signed: 08/19/2021 9:18:35 AM By: Worthy Keeler PA-C Entered By: Worthy Keeler on 08/19/2021 09:18:35 -------------------------------------------------------------------------------- SuperBill Details Patient Name: Date of Service: [name], [place] Collier 08/19/2021 Medical Record Number: [id number] Patient Account Number: [id number] Date of Birth/Sex: Treating RN: Aug 04, 1960 (61 y.o. Russell Collier Primary Care Provider: York Ram Other Clinician: Referring Provider: Treating Provider/Extender: Russell Collier, Janie Morning in Treatment: 1 Diagnosis Coding ICD-10 Codes Code Description [phone] Other specified peripheral vascular diseases [phone] Non-pressure chronic ulcer of other part of left lower leg with fat layer exposed I87.333 Chronic venous hypertension (idiopathic) with ulcer and inflammation of bilateral lower  extremity E11.622 Type 2 diabetes mellitus with other skin ulcer I25.10 Atherosclerotic heart disease of native coronary artery without angina pectoris Facility Procedures CPT4 Code: [id number] Description: 99213 - WOUND CARE VISIT-LEV 3 EST PT Modifier: Quantity: 1 Physician Procedures : CPT4 Code Description Modifier [id number] - WC PHYS LEVEL 4 - EST PT ICD-10 Diagnosis Description I73.89 Other specified peripheral vascular diseases L97.822 Non-pressure chronic ulcer of other part of left lower leg with fat layer exposed I87.333  Chronic venous hypertension (idiopathic) with ulcer and inflammation of bilateral lower extremity E11.622 Type 2 diabetes mellitus with other skin ulcer Quantity: 1 Electronic Signature(s) Signed: 08/19/2021 9:19:25 AM By: Worthy Keeler PA-C Previous Signature: 08/19/2021 9:19:04 AM Version By: Worthy Keeler PA-C Previous Signature: 08/19/2021 9:18:50 AM Version By: Worthy Keeler PA-C Entered By: Worthy Keeler on 08/19/2021 09:19:25
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
## 2021-08-20 ENCOUNTER — Encounter: Payer: Self-pay | Admitting: Family Medicine

## 2021-08-20 ENCOUNTER — Ambulatory Visit (INDEPENDENT_AMBULATORY_CARE_PROVIDER_SITE_OTHER): Payer: Medicaid Other | Admitting: Family Medicine

## 2021-08-20 VITALS — BP 116/71 | HR 86 | Wt 313.0 lb

## 2021-08-20 DIAGNOSIS — S81802A Unspecified open wound, left lower leg, initial encounter: Secondary | ICD-10-CM | POA: Diagnosis not present

## 2021-08-20 DIAGNOSIS — M5136 Other intervertebral disc degeneration, lumbar region: Secondary | ICD-10-CM | POA: Diagnosis not present

## 2021-08-20 DIAGNOSIS — R239 Unspecified skin changes: Secondary | ICD-10-CM

## 2021-08-20 MED ORDER — TRIAMCINOLONE ACETONIDE 0.5 % EX OINT: 1.0000 | TOPICAL_OINTMENT | Freq: Two times a day (BID) | CUTANEOUS | 0 refills | Status: DC

## 2021-08-20 NOTE — Patient Instructions (Addendum)
It was good seeing you today.  The itching in your legs I have sent in a stronger steroid cream.  Please do not apply to your open wounds.  Regarding the wound I have wrapped it with Coban and put some antibiotic ointment on it.  Please be sure to follow-up with wound care as well as your vascular doctors regarding the wound.  If it gets worse, starts draining, gets warm to touch, pain increases please be reevaluated immediately.  If you have any questions or concerns call the clinic.  Hope you have a great day!

## 2021-08-20 NOTE — Progress Notes (Signed)
    SUBJECTIVE:   CHIEF COMPLAINT / HPI:   Leg concerns Patient presents for what he believes is eczema on lower extremities bilaterally.  He does have a wound on his left lower extremity with a bandage over it.  Reports that he banged his shin on something hard and it bruised and now the skin is falling off.  He is putting antibiotic cream on it to decrease risk of infection.  Is still seeing wound care.  On his right lower extremity he reports hyperpigmentation and scaling which itches terribly.  He scratches it all the time.  He would like some treatment for this he has been using over-the-counter hydrocortisone cream as well as previously prescribed triamcinolone cream with minimal improvement.  OBJECTIVE:   BP 116/71   Pulse 86   Wt (!) 313 lb (142 kg)   BMI 44.28 kg/m   General: Pleasant, interactive 61 year old male in no acute distress Cardiac: Regular rate and rhythm Respiratory: No work of breathing MSK: Patient with lower extremity swelling bilaterally but greatly decreased from previous evaluation.  Erythema also decreased in the right lower extremity.  There is hyperpigmentation in the right shin.  Draining wound on the left shin see image below       ASSESSMENT/PLAN:   Unspecified skin changes Patient with hyperpigmentation of right shin with some scaling.  Could be eczema.  Reports has history of eczema.  Has been using over-the-counter anti-itch creams.  Will send prescription for triamcinolone 0.5% to patient's pharmacy and follow-up as needed.  Open wound of left lower extremity New wound to the left lower extremity.  Reports he hit it on some hard object and then this occurred.  He states he is currently seeing wound care.  It does not appear infected at this time, is not warm to touch, is not erythematous around the wound other than where the tape was attached holding a bandage over it.  Dressed with nonstick bandage, bacitracin ointment, and Coban.  Strict return  precautions given.     Concepcion Living, MD [place]

## 2021-08-21 DIAGNOSIS — R239 Unspecified skin changes: Secondary | ICD-10-CM | POA: Insufficient documentation

## 2021-08-21 DIAGNOSIS — S81802A Unspecified open wound, left lower leg, initial encounter: Secondary | ICD-10-CM | POA: Insufficient documentation

## 2021-08-21 DIAGNOSIS — M5136 Other intervertebral disc degeneration, lumbar region: Secondary | ICD-10-CM | POA: Diagnosis not present

## 2021-08-21 HISTORY — DX: Unspecified open wound, left lower leg, initial encounter: S81.802A

## 2021-08-21 NOTE — Assessment & Plan Note (Signed)
New wound to the left lower extremity.  Reports he hit it on some hard object and then this occurred.  He states he is currently seeing wound care.  It does not appear infected at this time, is not warm to touch, is not erythematous around the wound other than where the tape was attached holding a bandage over it.  Dressed with nonstick bandage, bacitracin ointment, and Coban.  Strict return precautions given.

## 2021-08-21 NOTE — Assessment & Plan Note (Signed)
Patient with hyperpigmentation of right shin with some scaling.  Could be eczema.  Reports has history of eczema.  Has been using over-the-counter anti-itch creams.  Will send prescription for triamcinolone 0.5% to patient's pharmacy and follow-up as needed.

## 2021-08-24 ENCOUNTER — Other Ambulatory Visit: Payer: Self-pay

## 2021-08-24 DIAGNOSIS — I70222 Atherosclerosis of native arteries of extremities with rest pain, left leg: Secondary | ICD-10-CM

## 2021-08-24 DIAGNOSIS — M5136 Other intervertebral disc degeneration, lumbar region: Secondary | ICD-10-CM | POA: Diagnosis not present

## 2021-08-25 ENCOUNTER — Encounter (HOSPITAL_COMMUNITY): Payer: Self-pay

## 2021-08-25 ENCOUNTER — Emergency Department (HOSPITAL_COMMUNITY)
Admission: EM | Admit: 2021-08-25 | Discharge: 2021-08-25 | Disposition: A | Discharge status: Home / Self Care | Payer: Medicaid Other | Attending: Emergency Medicine | Admitting: Emergency Medicine

## 2021-08-25 DIAGNOSIS — I10 Essential (primary) hypertension: Secondary | ICD-10-CM | POA: Diagnosis not present

## 2021-08-25 DIAGNOSIS — Z79899 Other long term (current) drug therapy: Secondary | ICD-10-CM | POA: Insufficient documentation

## 2021-08-25 DIAGNOSIS — L089 Local infection of the skin and subcutaneous tissue, unspecified: Secondary | ICD-10-CM | POA: Diagnosis present

## 2021-08-25 DIAGNOSIS — M5136 Other intervertebral disc degeneration, lumbar region: Secondary | ICD-10-CM | POA: Diagnosis not present

## 2021-08-25 DIAGNOSIS — I509 Heart failure, unspecified: Secondary | ICD-10-CM | POA: Diagnosis not present

## 2021-08-25 DIAGNOSIS — E119 Type 2 diabetes mellitus without complications: Secondary | ICD-10-CM | POA: Diagnosis not present

## 2021-08-25 DIAGNOSIS — L299 Pruritus, unspecified: Secondary | ICD-10-CM | POA: Diagnosis not present

## 2021-08-25 DIAGNOSIS — I110 Hypertensive heart disease with heart failure: Secondary | ICD-10-CM | POA: Insufficient documentation

## 2021-08-25 DIAGNOSIS — T7840XA Allergy, unspecified, initial encounter: Secondary | ICD-10-CM | POA: Diagnosis not present

## 2021-08-25 DIAGNOSIS — I2510 Atherosclerotic heart disease of native coronary artery without angina pectoris: Secondary | ICD-10-CM | POA: Diagnosis not present

## 2021-08-25 DIAGNOSIS — Z7901 Long term (current) use of anticoagulants: Secondary | ICD-10-CM | POA: Insufficient documentation

## 2021-08-25 DIAGNOSIS — T782XXA Anaphylactic shock, unspecified, initial encounter: Secondary | ICD-10-CM | POA: Diagnosis not present

## 2021-08-25 DIAGNOSIS — L509 Urticaria, unspecified: Secondary | ICD-10-CM | POA: Diagnosis not present

## 2021-08-25 MED ORDER — DOXYCYCLINE HYCLATE 100 MG PO CAPS: 100.0000 mg | ORAL_CAPSULE | Freq: Two times a day (BID) | ORAL | 0 refills | Status: AC

## 2021-08-25 MED ORDER — DIPHENHYDRAMINE HCL 25 MG PO CAPS
25.0000 mg | ORAL_CAPSULE | Freq: Once | ORAL | Status: AC
Start: 2021-08-25 — End: 2021-08-25
  Administered 2021-08-25: 25 mg via ORAL
  Filled 2021-08-25: qty 1

## 2021-08-25 NOTE — ED Provider Notes (Signed)
[facility] EMERGENCY DEPARTMENT Provider Note   CSN: [id number] Arrival date & time: 08/25/21  2148     History  Chief Complaint  Patient presents with   Allergic Reaction    Russell Collier is a 61 y.o. male with history of type 2 diabetes mellitus as well as  left lower extremity venous stasis wound initially noted at family medicine on 08/20/2021.  See photos in medicine chart.  He is following with wound care at this time, currently on Bactrim.  States he presented to the emergency department today due to rash and itching that occurred starting in the last 36 hours, currently on day 5 of oral Bactrim for wound.  Endorses itching on his back, forearms, hands, and noticing bumps popping up on his hands and around his left lower extremity wound.  No sore throat, lesions in his mouth, difficulty breathing, nausea, vomiting, fevers, or chills.  Denies known history of reaction to Bactrim in the past.  In addition to the above listed medical history of the patient's chart and found him to have a history of hypertension, CHF, CAD, OSA on CPAP, lumbar radiculopathy, cardiac amyloidosis.  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  HPI     Home Medications Prior to Admission medications   Medication Sig Start Date End Date Taking? Authorizing Provider  doxycycline (VIBRAMYCIN) 100 MG capsule Take 1 capsule (100 mg total) by mouth 2 (two) times daily for 7 days. 08/25/21 09/01/21 Yes Aissa Lisowski, Gypsy Balsam, PA-C  Accu-Chek Softclix Lancets lancets Use as instructed 11/20/20   Concepcion Living, MD  acetaminophen (TYLENOL) 325 MG tablet Take 2 tablets (650 mg total) by mouth every 6 (six) hours as needed for mild pain or moderate pain. 07/13/21   Dameron, Luna Fuse, DO  albuterol (PROVENTIL) (2.5 MG/3ML) 0.083% nebulizer solution Take 3 mLs (2.5 mg total) by nebulization every 6 (six) hours as needed for wheezing or shortness of breath. 11/20/20   Cresenzo, Angelyn Punt,  MD  albuterol (VENTOLIN HFA) 108 (90 Base) MCG/ACT inhaler Inhale 2 puffs into the lungs every 6 (six) hours as needed for wheezing or shortness of breath. 11/20/20   Cresenzo, Angelyn Punt, MD  ANORO ELLIPTA 62.5-25 MCG/ACT AEPB INHALE 1 PUFF INTO THE LUNGS DAILY AS NEEDED. Patient taking differently: Inhale 1 puff into the lungs daily as needed (for "flares"). 05/19/21   Cresenzo, Angelyn Punt, MD  apixaban (ELIQUIS) 2.5 MG TABS tablet Take 1 tablet (2.5 mg total) by mouth 2 (two) times daily. 11/20/20   Concepcion Living, MD  atorvastatin (LIPITOR) 80 MG tablet Take 1 tablet (80 mg total) by mouth daily. 11/20/20   Concepcion Living, MD  Blood Glucose Monitoring Suppl (ACCU-CHEK GUIDE) w/Device KIT 1 Units by Does not apply route daily. 07/23/21   Concepcion Living, MD  enalapril (VASOTEC) 20 MG tablet Take 1 tablet (20 mg total) by mouth at bedtime. 11/20/20   Cresenzo, Angelyn Punt, MD  FARXIGA 10 MG TABS tablet TAKE 1 TABLET (10 MG TOTAL) BY MOUTH DAILY BEFORE BREAKFAST. Patient taking differently: Take 10 mg by mouth daily. 06/16/21   Cresenzo, Angelyn Punt, MD  fluticasone (FLONASE) 50 MCG/ACT nasal spray Place 1 spray into both nostrils daily. Patient taking differently: Place 1 spray into both nostrils daily as needed for allergies. 11/20/20   Cresenzo, Angelyn Punt, MD  furosemide (LASIX) 40 MG tablet Take 1.5 tablets (60 mg total) by mouth 2 (two) times daily. 05/21/21   Consuelo Pandy,  PA-C  glucose blood (ACCU-CHEK GUIDE) test strip Use as instructed 11/20/20   Concepcion Living, MD  ibuprofen (ADVIL) 200 MG tablet Take 600 mg by mouth every 6 (six) hours as needed for headache or moderate pain.    [provider]  isosorbide mononitrate (IMDUR) 30 MG 24 hr tablet Take 1 tablet (30 mg total) by mouth daily. 11/20/20   Cresenzo, Angelyn Punt, MD  multivitamin (ONE-A-DAY MEN'S) TABS tablet Take 1 tablet by mouth daily with breakfast.    [provider]  pantoprazole (PROTONIX) 40 MG tablet TAKE 1 TABLET (40 MG  TOTAL) BY MOUTH DAILY. Patient taking differently: Take 40 mg by mouth daily. 05/19/21   Cresenzo, Angelyn Punt, MD  potassium chloride SA (KLOR-CON M) 20 MEQ tablet Take 2 tablets (40 mEq total) by mouth daily. 05/21/21   Lyda Jester M, PA-C  pregabalin (LYRICA) 100 MG capsule TAKE ONE CAPSULE BY MOUTH THREE TIMES DAILY Patient taking differently: Take 100 mg by mouth 3 (three) times daily. 04/23/21   Concepcion Living, MD  Tafamidis (VYNDAMAX) 61 MG CAPS TAKE 1 CAPSULE BY MOUTH DAILY. Patient taking differently: Take 61 mg by mouth daily. 12/01/20 12/01/21  [name], [name], MD  traMADol (ULTRAM) 50 MG tablet Take 1 tablet (50 mg total) by mouth every 6 (six) hours as needed. 07/24/21   Horton, Barbette Hair, MD  triamcinolone ointment (KENALOG) 0.5 % Apply 1 Application topically 2 (two) times daily. 08/20/21   Cresenzo, Angelyn Punt, MD  valACYclovir (VALTREX) 500 MG tablet Take 1 tablet (500 mg total) by mouth daily. 11/20/20   Concepcion Living, MD  Vitamin A 2400 MCG (8000 UT) TABS Take 1 tablet by mouth daily. Patient taking differently: Take 8,000 Units by mouth daily. 05/21/21   [name], [name], MD  vutrisiran sodium (AMVUTTRA) 25 MG/0.5ML syringe Inject 0.5 mLs (25 mg total) into the skin every 3 (three) months. 05/01/21   [name], [name], MD      Allergies    Chantix [varenicline], Bupropion, Bactrim [sulfamethoxazole-trimethoprim], and Nicoderm [nicotine]    Review of Systems   Review of Systems  Constitutional: Negative.   HENT: Negative.    Respiratory: Negative.    Cardiovascular: Negative.   Gastrointestinal: Negative.   Genitourinary: Negative.   Musculoskeletal: Negative.   Skin:  Positive for rash and wound.  Neurological: Negative.     Physical Exam Updated Vital Signs BP 115/67   Pulse 73   Temp 98.3 F (36.8 C) (Oral)   Resp 19   SpO2 97%  Physical Exam Vitals and nursing note reviewed.  Constitutional:      Appearance: He is obese. He is not ill-appearing or  toxic-appearing.  HENT:     Head: Normocephalic and atraumatic.     Mouth/Throat:     Mouth: Mucous membranes are moist.     Pharynx: No oropharyngeal exudate or posterior oropharyngeal erythema.  Eyes:     General:        Right eye: No discharge.        Left eye: No discharge.     Conjunctiva/sclera: Conjunctivae normal.  Cardiovascular:     Rate and Rhythm: Normal rate and regular rhythm.     Pulses: Normal pulses.     Heart sounds: Normal heart sounds. No murmur heard. Pulmonary:     Effort: Pulmonary effort is normal. No respiratory distress.     Breath sounds: Normal breath sounds. No wheezing or rales.  Abdominal:     General:  Bowel sounds are normal. There is no distension.     Palpations: Abdomen is soft.     Tenderness: There is no abdominal tenderness. There is no guarding or rebound.  Musculoskeletal:        General: No deformity.     Cervical back: Neck supple.  Lymphadenopathy:     Cervical: No cervical adenopathy.  Skin:    General: Skin is warm and dry.     Capillary Refill: Capillary refill takes less than 2 seconds.     Findings: Rash present. Rash is vesicular.       Neurological:     General: No focal deficit present.     Mental Status: He is alert and oriented to person, place, and time. Mental status is at baseline.  Psychiatric:        Mood and Affect: Mood normal.     ED Results / Procedures / Treatments   Labs (all labs ordered are listed, but only abnormal results are displayed) Labs Reviewed - No data to display  EKG EKG Interpretation  Date/Time:  Tuesday August 25 2021 21:56:48 EDT Ventricular Rate:  76 PR Interval:  188 QRS Duration: 126 QT Interval:  396 QTC Calculation: 445 R Axis:   -28 Text Interpretation: Normal sinus rhythm Non-specific intra-ventricular conduction block Minimal voltage criteria for LVH, may be normal variant ( Cornell product ) Nonspecific T wave abnormality Abnormal ECG When compared with ECG of 25-Aug-2021  21:55, No significant change since last tracing Confirmed by Dorie Rank [phone]) on 08/25/2021 10:16:13 PM  Radiology No results found.  Procedures Procedures    Medications Ordered in ED Medications  diphenhydrAMINE (BENADRYL) capsule 25 mg (25 mg Oral Given 08/25/21 2253)    ED Course/ Medical Decision Making/ A&P                           Medical Decision Making 61 year old male presents with concern for hives and itching after starting Bactrim for left lower extremity wound.  Vital signs are normal and intake.  Cardiopulmonary exam is normal, abdominal exam is benign.  No sign of oropharyngeal lesion or swelling.  No visible urticaria at this time.  Vesicular changes to the hand and then around the wound though around the wound is clearly delineated in the outline of the nonadherent dressing that was applied.  Differential diagnosis includes limited to allergic reaction, contact dermatitis, herpetic whitlow, SJS.  Risk Prescription drug management.   No evidence of SJS without mucosal involvement, skin skin, or rash consistent with this etiology.  Regarding changes over the wound area suspect contact dermatitis secondary to reaction to nonadherent bandage.  Regarding changes in the hand, question possible herpetic whitlow though exact etiology is not clear.  There does not appear to be any emergent etiology for the patient's presenting symptoms.  Was administered Benadryl for reported itching.  Discontinued Bactrim and started patient on doxycycline instead.  Recommend close outpatient follow-up.  No further work-up warranted near this time.  Govanni  voiced understanding of his medical evaluation and treatment plan. Each of their questions answered to their expressed satisfaction.  Return precautions were given.  Patient is well-appearing, stable, and was discharged in good condition.  This chart was dictated using voice recognition software, Dragon. Despite the best efforts of this  provider to proofread and correct errors, errors may still occur which can change documentation meaning.          Final Clinical Impression(s) /  ED Diagnoses Final diagnoses:  Allergic reaction, initial encounter  Wound infection    Rx / DC Orders ED Discharge Orders          Ordered    doxycycline (VIBRAMYCIN) 100 MG capsule  2 times daily        08/25/21 2249              Ariane Ditullio, Gypsy Balsam, PA-C 08/26/21 6462    Dorie Rank, MD 08/26/21 1105

## 2021-08-25 NOTE — ED Provider Notes (Incomplete)
[facility] EMERGENCY DEPARTMENT Provider Note   CSN: [id number] Arrival date & time: 08/25/21  2148     History {Add pertinent medical, surgical, social history, OB history to HPI:1} Chief Complaint  Patient presents with  . Allergic Reaction    Russell Collier is a 61 y.o. male with history of type 2 diabetes mellitus as well as  left lower extremity venous stasis wound initially noted at family medicine on 08/20/2021.  See photos in medicine chart.  He is following with wound care at this time, currently on Bactrim.  States he presented to the emergency department today due to rash and itching that occurred starting in the last 36 hours, currently on day 5 of oral Bactrim for wound.  Endorses itching on his back, forearms, hands, and noticing bumps popping up on his hands and around his left lower extremity wound.  No sore throat, lesions in his mouth, difficulty breathing, nausea, vomiting, fevers, or chills.  Denies known history of reaction to Bactrim in the past.  In addition to the above listed medical history of the patient's chart and found him to have a history of hypertension, CHF, CAD, OSA on CPAP, lumbar radiculopathy, cardiac amyloidosis.  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  HPI     Home Medications Prior to Admission medications   Medication Sig Start Date End Date Taking? Authorizing Provider  Accu-Chek Softclix Lancets lancets Use as instructed 11/20/20   Cresenzo, Angelyn Punt, MD  acetaminophen (TYLENOL) 325 MG tablet Take 2 tablets (650 mg total) by mouth every 6 (six) hours as needed for mild pain or moderate pain. 07/13/21   Dameron, Luna Fuse, DO  albuterol (PROVENTIL) (2.5 MG/3ML) 0.083% nebulizer solution Take 3 mLs (2.5 mg total) by nebulization every 6 (six) hours as needed for wheezing or shortness of breath. 11/20/20   Cresenzo, Angelyn Punt, MD  albuterol (VENTOLIN HFA) 108 (90 Base) MCG/ACT inhaler Inhale 2 puffs into the lungs  every 6 (six) hours as needed for wheezing or shortness of breath. 11/20/20   Cresenzo, Angelyn Punt, MD  ANORO ELLIPTA 62.5-25 MCG/ACT AEPB INHALE 1 PUFF INTO THE LUNGS DAILY AS NEEDED. Patient taking differently: Inhale 1 puff into the lungs daily as needed (for "flares"). 05/19/21   Cresenzo, Angelyn Punt, MD  apixaban (ELIQUIS) 2.5 MG TABS tablet Take 1 tablet (2.5 mg total) by mouth 2 (two) times daily. 11/20/20   Concepcion Living, MD  atorvastatin (LIPITOR) 80 MG tablet Take 1 tablet (80 mg total) by mouth daily. 11/20/20   Concepcion Living, MD  Blood Glucose Monitoring Suppl (ACCU-CHEK GUIDE) w/Device KIT 1 Units by Does not apply route daily. 07/23/21   Cresenzo, Angelyn Punt, MD  doxycycline (VIBRA-TABS) 100 MG tablet Take 1 tablet (100 mg total) by mouth every 12 (twelve) hours. 07/13/21   Dameron, Luna Fuse, DO  enalapril (VASOTEC) 20 MG tablet Take 1 tablet (20 mg total) by mouth at bedtime. 11/20/20   Cresenzo, Angelyn Punt, MD  FARXIGA 10 MG TABS tablet TAKE 1 TABLET (10 MG TOTAL) BY MOUTH DAILY BEFORE BREAKFAST. Patient taking differently: Take 10 mg by mouth daily. 06/16/21   Cresenzo, Angelyn Punt, MD  fluticasone (FLONASE) 50 MCG/ACT nasal spray Place 1 spray into both nostrils daily. Patient taking differently: Place 1 spray into both nostrils daily as needed for allergies. 11/20/20   Cresenzo, Angelyn Punt, MD  furosemide (LASIX) 40 MG tablet Take 1.5 tablets (60 mg total) by mouth 2 (two) times daily. 05/21/21  Lyda Jester M, PA-C  glucose blood (ACCU-CHEK GUIDE) test strip Use as instructed 11/20/20   Concepcion Living, MD  ibuprofen (ADVIL) 200 MG tablet Take 600 mg by mouth every 6 (six) hours as needed for headache or moderate pain.    [provider]  isosorbide mononitrate (IMDUR) 30 MG 24 hr tablet Take 1 tablet (30 mg total) by mouth daily. 11/20/20   Cresenzo, Angelyn Punt, MD  multivitamin (ONE-A-DAY MEN'S) TABS tablet Take 1 tablet by mouth daily with breakfast.    [provider]  pantoprazole  (PROTONIX) 40 MG tablet TAKE 1 TABLET (40 MG TOTAL) BY MOUTH DAILY. Patient taking differently: Take 40 mg by mouth daily. 05/19/21   Cresenzo, Angelyn Punt, MD  potassium chloride SA (KLOR-CON M) 20 MEQ tablet Take 2 tablets (40 mEq total) by mouth daily. 05/21/21   Lyda Jester M, PA-C  pregabalin (LYRICA) 100 MG capsule TAKE ONE CAPSULE BY MOUTH THREE TIMES DAILY Patient taking differently: Take 100 mg by mouth 3 (three) times daily. 04/23/21   Concepcion Living, MD  Tafamidis (VYNDAMAX) 61 MG CAPS TAKE 1 CAPSULE BY MOUTH DAILY. Patient taking differently: Take 61 mg by mouth daily. 12/01/20 12/01/21  [name], [name], MD  traMADol (ULTRAM) 50 MG tablet Take 1 tablet (50 mg total) by mouth every 6 (six) hours as needed. 07/24/21   Horton, Barbette Hair, MD  triamcinolone ointment (KENALOG) 0.5 % Apply 1 Application topically 2 (two) times daily. 08/20/21   Cresenzo, Angelyn Punt, MD  valACYclovir (VALTREX) 500 MG tablet Take 1 tablet (500 mg total) by mouth daily. 11/20/20   Concepcion Living, MD  Vitamin A 2400 MCG (8000 UT) TABS Take 1 tablet by mouth daily. Patient taking differently: Take 8,000 Units by mouth daily. 05/21/21   [name], [name], MD  vutrisiran sodium (AMVUTTRA) 25 MG/0.5ML syringe Inject 0.5 mLs (25 mg total) into the skin every 3 (three) months. 05/01/21   [name], [name], MD      Allergies    Chantix [varenicline], Bupropion, Bactrim [sulfamethoxazole-trimethoprim], and Nicoderm [nicotine]    Review of Systems   Review of Systems  Constitutional: Negative.   HENT: Negative.    Respiratory: Negative.    Cardiovascular: Negative.   Gastrointestinal: Negative.   Genitourinary: Negative.   Musculoskeletal: Negative.   Skin:  Positive for rash and wound.  Neurological: Negative.     Physical Exam Updated Vital Signs BP 118/75   Pulse 72   Temp 98.3 F (36.8 C) (Oral)   Resp 18   SpO2 95%  Physical Exam Vitals and nursing note reviewed.  Constitutional:       Appearance: He is obese. He is not ill-appearing or toxic-appearing.  HENT:     Head: Normocephalic and atraumatic.     Mouth/Throat:     Mouth: Mucous membranes are moist.     Pharynx: No oropharyngeal exudate or posterior oropharyngeal erythema.  Eyes:     General:        Right eye: No discharge.        Left eye: No discharge.     Conjunctiva/sclera: Conjunctivae normal.  Cardiovascular:     Rate and Rhythm: Normal rate and regular rhythm.     Pulses: Normal pulses.     Heart sounds: Normal heart sounds. No murmur heard. Pulmonary:     Effort: Pulmonary effort is normal. No respiratory distress.     Breath sounds: Normal breath sounds. No wheezing or rales.  Abdominal:  General: Bowel sounds are normal. There is no distension.     Palpations: Abdomen is soft.     Tenderness: There is no abdominal tenderness. There is no guarding or rebound.  Musculoskeletal:        General: No deformity.     Cervical back: Neck supple.  Lymphadenopathy:     Cervical: No cervical adenopathy.  Skin:    General: Skin is warm and dry.     Capillary Refill: Capillary refill takes less than 2 seconds.     Findings: Rash present. Rash is vesicular.  Neurological:     General: No focal deficit present.     Mental Status: He is alert and oriented to person, place, and time. Mental status is at baseline.  Psychiatric:        Mood and Affect: Mood normal.     ED Results / Procedures / Treatments   Labs (all labs ordered are listed, but only abnormal results are displayed) Labs Reviewed - No data to display  EKG EKG Interpretation  Date/Time:  Tuesday August 25 2021 21:56:48 EDT Ventricular Rate:  76 PR Interval:  188 QRS Duration: 126 QT Interval:  396 QTC Calculation: 445 R Axis:   -28 Text Interpretation: Normal sinus rhythm Non-specific intra-ventricular conduction block Minimal voltage criteria for LVH, may be normal variant ( Cornell product ) Nonspecific T wave abnormality Abnormal  ECG When compared with ECG of 25-Aug-2021 21:55, No significant change since last tracing Confirmed by Dorie Rank [phone]) on 08/25/2021 10:16:13 PM  Radiology No results found.  Procedures Procedures  {Document cardiac monitor, telemetry assessment procedure when appropriate:1}  Medications Ordered in ED Medications - No data to display  ED Course/ Medical Decision Making/ A&P                           Medical Decision Making  ***  {Document critical care time when appropriate:1} {Document review of labs and clinical decision tools ie heart score, Chads2Vasc2 etc:1}  {Document your independent review of radiology images, and any outside records:1} {Document your discussion with family members, caretakers, and with consultants:1} {Document social determinants of health affecting pt's care:1} {Document your decision making why or why not admission, treatments were needed:1} Final Clinical Impression(s) / ED Diagnoses Final diagnoses:  None    Rx / DC Orders ED Discharge Orders     None

## 2021-08-25 NOTE — ED Triage Notes (Addendum)
PER EMS: pt is from home, reports he started taking Bactim 5 days ago for wound on his lower left leg and states he thinks he is having an allergic reaction due to itching and hives to his back and hands that he noticed yesterday. Denies SOB, no oral swelling, airway intact. Pt speaking clear complete sentences.  BP- 134/66, HR-98, O2-97%

## 2021-08-25 NOTE — Discharge Instructions (Addendum)
You were seen here today for your rashes.  It appears you are having a reaction to the bandages you are using over your wound.  Please change the brand of nonadherent dressing you are utilizing.  In regards to the itching you are experiencing an antibiotic please stop taking the Bactrim.  You been prescribed a new antibiotic which take twice a day for the next 7 days.  Please take it as prescribed for the entire course.  Follow-up with your primary care doctor and wound care.  Use Benadryl or other antihistamine such as Zyrtec for the itching you are experiencing associated with the prior antibiotic and follow-up closely with your primary care doctor for reevaluation of the rash on your right hand.  Return to the ER with any new severe symptoms.

## 2021-08-26 ENCOUNTER — Telehealth: Payer: Self-pay | Admitting: Licensed Clinical Social Worker

## 2021-08-26 ENCOUNTER — Encounter (HOSPITAL_BASED_OUTPATIENT_CLINIC_OR_DEPARTMENT_OTHER): Payer: Medicaid Other | Attending: Physician Assistant | Admitting: Physician Assistant

## 2021-08-26 DIAGNOSIS — E1140 Type 2 diabetes mellitus with diabetic neuropathy, unspecified: Secondary | ICD-10-CM | POA: Diagnosis not present

## 2021-08-26 DIAGNOSIS — L97822 Non-pressure chronic ulcer of other part of left lower leg with fat layer exposed: Secondary | ICD-10-CM | POA: Diagnosis not present

## 2021-08-26 DIAGNOSIS — J441 Chronic obstructive pulmonary disease with (acute) exacerbation: Secondary | ICD-10-CM | POA: Diagnosis not present

## 2021-08-26 DIAGNOSIS — I872 Venous insufficiency (chronic) (peripheral): Secondary | ICD-10-CM | POA: Insufficient documentation

## 2021-08-26 DIAGNOSIS — E11622 Type 2 diabetes mellitus with other skin ulcer: Secondary | ICD-10-CM | POA: Insufficient documentation

## 2021-08-26 DIAGNOSIS — I87333 Chronic venous hypertension (idiopathic) with ulcer and inflammation of bilateral lower extremity: Secondary | ICD-10-CM | POA: Diagnosis not present

## 2021-08-26 DIAGNOSIS — I2510 Atherosclerotic heart disease of native coronary artery without angina pectoris: Secondary | ICD-10-CM | POA: Insufficient documentation

## 2021-08-26 DIAGNOSIS — I7389 Other specified peripheral vascular diseases: Secondary | ICD-10-CM | POA: Diagnosis present

## 2021-08-26 DIAGNOSIS — E119 Type 2 diabetes mellitus without complications: Secondary | ICD-10-CM | POA: Diagnosis not present

## 2021-08-26 DIAGNOSIS — I10 Essential (primary) hypertension: Secondary | ICD-10-CM | POA: Insufficient documentation

## 2021-08-26 DIAGNOSIS — M5136 Other intervertebral disc degeneration, lumbar region: Secondary | ICD-10-CM | POA: Diagnosis not present

## 2021-08-26 NOTE — Patient Outreach (Signed)
[facility]) Care Management  08/26/2021  Russell Collier [birth year] [id number]  Transition Care Management Follow-up Telephone Call Date of discharge and from where: 08/25/21 at [facility] ED. How have you been since you were released from the hospital? Great Any questions or concerns? No  Items Reviewed: Did the pt receive and understand the discharge instructions provided? Yes  Medications obtained and verified?  Patient will be contacting his pharmacy today to gain new antibiotic as he was allergie to the last one prescribed.  Other? No  Any new allergies since your discharge? Yes -BACTRIM Dietary orders reviewed? Yes Do you have support at home? Yes   Home Care and Equipment/Supplies: Were home health services ordered? no If so, what is the name of the agency? N/a  Has the agency set up a time to come to the patient's home? not applicable Were any new equipment or medical supplies ordered?  No What is the name of the medical supply agency? N/A Were you able to get the supplies/equipment? not applicable Do you have any questions related to the use of the equipment or supplies? No  Functional Questionnaire: (I = Independent and D = Dependent) ADLs: I  Bathing/Dressing- I  Meal Prep- I  Eating- I  Maintaining continence- I  Transferring/Ambulation- I  Managing Meds- I  Follow up appointments reviewed:  PCP Hospital f/u appt confirmed? No  unless another allergic reactions occurs Russell Collier Hospital f/u appt confirmed? No  but patient has a wound care appointment today at 3:30 pm. He was reminded of this appointment today by [name] LCSW.  Are transportation arrangements needed? No  If their condition worsens, is the pt aware to call PCP or go to the Emergency Dept.? Yes Was the patient provided with contact information for the PCP's office or ED? Yes Was to pt encouraged to call back with questions or concerns? Yes  Eula Fried, BSW, MSW, [employer] Managed  Medicaid [email or web address] Phone: [phone]

## 2021-08-26 NOTE — Progress Notes (Addendum)
Russell, Collier ([id number]) Visit Report for 08/26/2021 Chief Complaint Document Details Patient Name: Date of Service: Russell Collier 08/26/2021 3:30 PM Medical Record Number: [id number] Patient Account Number: [id number] Date of Birth/Sex: Treating RN: [birth year] (61 y.o. Russell Collier Primary Care Provider: Garner Gavel HN Other Clinician: Referring Provider: Treating Provider/Extender: Delma Freeze HN Weeks in Treatment: 2 Information Obtained from: Patient Chief Complaint Left anterior LE Ulcer Electronic Signature(s) Signed: 08/26/2021 3:16:48 PM By: Worthy Keeler PA-C Entered By: Worthy Keeler on 08/26/2021 15:16:48 -------------------------------------------------------------------------------- HPI Details Patient Name: Date of Service: Russell Collier 08/26/2021 3:30 PM Medical Record Number: [id number] Patient Account Number: [id number] Date of Birth/Sex: Treating RN: 07-29-1960 (61 y.o. Russell Collier Primary Care Provider: Garner Gavel HN Other Clinician: Referring Provider: Treating Provider/Extender: Clearance Coots, JO HN Weeks in Treatment: 2 History of Present Illness HPI Description: ADMISSION 09/14/2018 This is a 61 year old man who traumatized his left anterior tibia area on the bathtub. He describes this initially as a bruise that subsequently reopened. He has been seen in the ER several times and has had at least 3 courses of antibiotics including doxy and Keflex twice. He has been using Neosporin and peroxide on this. Past medical history includes obstructive sleep apnea, cellulitis, hypertension, coronary artery disease, peripheral neuropathy [nondiabetic], currently on a heart murmur for atypical chest pain and bradycardia. 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 ARTERIAL STUDIES done in March 2020 showed an ABI on the left of 0.93, TBI on the left slightly reduced at 0.42. On the right his ABI was 0.95 with a TBI of 0.51 he  has monophasic waveforms in the lower extremities bilaterally. Interpreted on the left this showing mild left lower extremity arterial disease as well as on the right. 09/25/18-Patient returns at 1 week to the clinic, noted that arterial studies done in March 2020. We have been using Iodoflex to the wound on the left leg, patient has significant degree of pain and declines to have any debridement 8/21; the patient has not been here in 3 weeks. Since he was last here he was admitted to hospital on 8/4. He was felt to have stable angina. He has since been to his cardiologist. I note that he has an EF of about 48%. He has a venous wound on the left anterior mid tibia area probably with lymphedema. We have been using Iodoflex. He does not tolerate debridement well. He tells me that his cardiologist has been adjusting his Lasix and then the wraps fall down he tries to pull them up. He is describing pain making it difficult for him to function. 9/3; patient is was hospitalized since he was last here with COPD exacerbation. Came in with Aquacel Ag on the wound. The wound actually looks improved. 9/17; patient was apparently in the ER again with breathing issues and blood pressure issues. He was not admitted. We are using Sorbact of the wound with nice improvement dimensions are better 10/1; the patient's wound is smaller and appears to have a reasonable healthy surface. We are using Sorbact 10/8; the wound is smaller although there is still an area with depth and necrotic debris at the base. We are using Sorbact 10/15; not much change in the wound area. We have been using Sorbact. 10/22; small wound area using endoform since last week 11/5; Russell Collier arrived today in a healed state. Left leg and wrapped. He still does not have compression stockings  Readmission: 08-12-2021 upon evaluation today patient presents for evaluation here in the clinic he has been seen last in 2020 at that point Dr. Dellia Nims was the  treating physician. Subsequently the patient tells me that he does have a history of peripheral vascular disease, chronic venous insufficiency, diabetes mellitus type 2, and coronary artery disease. Unfortunately he has had some issues with his right leg he recently has been [facility] and they did perform an arteriogram on the right leg I believe even putting a stent in. With that being said that leg seems to be doing well he mainly just needs a compression sock. In regard to the left leg which is the one in question he tells me the same day he had a surgery he hit the left leg on a piece of furniture on the anterior shin. This caused a area that seems to be a hematoma present at this point. With that being said the patient is having significant discomfort. He tells me. He has been taking ibuprofen which he should not be doing I discussed that with him today. Tylenol is really only should be taken being on the Eliquis. He also tells me that he has not really been put anything on other than a Band-Aid I really think he may benefit from some light compression I do not want to do anything too tight until we get a good review of his arterial flow as his ABIs are not good we got here in the office today was 0.75 on the left 0.65 on the right and to be honest he did not have palpable pulses that I could find. He does not appear to have had an arterial study since 2020 as noted above by Dr. Dellia Nims. 08-19-2021 upon evaluation today patient appears to be doing okay in regard to his wound although he does appear to have some fluid buildup underneath this area. Again I am a little reluctant to try to open this up right now due to the fact that the patient does not appear to have good blood flow based on our initial inspection during the clinic evaluation today. Again he does see his vascular surgeon today. We did not have any palpable pulses that we could find last time I saw him and  overall I think that he probably just needs to be seen in order to evaluate and see where things stand. 08-26-2021 upon evaluation today patient appears to be doing well currently in regard to his wound. Has been tolerating the dressing changes without complication. Fortunately we do not have a big open area but he does have some fluid underneath in the center this looks like it might start draining on its own soon which would be ideal. With that being said right now he has been using the Curlex and Coban wrap which she does tolerate okay. He is going for an arteriogram on 14 July. After that if we need to open this we should be able to presuming they get his blood flow going appropriately. Electronic Signature(s) Signed: 08/26/2021 4:20:38 PM By: Worthy Keeler PA-C Entered By: Worthy Keeler on 08/26/2021 16:20:37 -------------------------------------------------------------------------------- Physical Exam Details Patient Name: Date of Service: Russell Collier 08/26/2021 3:30 PM Medical Record Number: [id number] Patient Account Number: [id number] Date of Birth/Sex: Treating RN: 1960/09/13 (61 y.o. Russell Collier Primary Care Provider: Garner Gavel HN Other Clinician: Referring Provider: Treating Provider/Extender: Clearance Coots, JO HN Weeks  in Treatment: 2 Constitutional Chronically ill appearing but in no apparent acute distress. Respiratory normal breathing without difficulty. Psychiatric this patient is able to make decisions and demonstrates good insight into disease process. Alert and Oriented x 3. pleasant and cooperative. Notes Upon inspection patient's wound bed actually showed signs of good granulation and epithelization at this point. Fortunately I do not see any evidence of active infection locally or systemically at this time which is great news. He unfortunately did have a rash from the Bactrim therefore he was switched away from Bactrim placed on doxycycline  that was when he went to the ER last night due to the excessive itching. Electronic Signature(s) Signed: 08/26/2021 4:21:13 PM By: Worthy Keeler PA-C Entered By: Worthy Keeler on 08/26/2021 16:21:13 -------------------------------------------------------------------------------- Physician Orders Details Patient Name: Date of Service: [place] 08/26/2021 3:30 PM Medical Record Number: [id number] Patient Account Number: [id number] Date of Birth/Sex: Treating RN: 29-Jul-1960 (61 y.o. Russell Collier Primary Care Provider: Garner Gavel HN Other Clinician: Referring Provider: Treating Provider/Extender: Delma Freeze HN Weeks in Treatment: 2 Verbal / Phone Orders: No Diagnosis Coding ICD-10 Coding Code Description I73.89 Other specified peripheral vascular diseases L97.822 Non-pressure chronic ulcer of other part of left lower leg with fat layer exposed I87.333 Chronic venous hypertension (idiopathic) with ulcer and inflammation of bilateral lower extremity E11.622 Type 2 diabetes mellitus with other skin ulcer I25.10 Atherosclerotic heart disease of native coronary artery without angina pectoris Follow-up Appointments ppointment in 1 week. - 09/02/21 @ 10:15am with Orland Jarred, RN (Room 7) Return A Other: - -Do not use Ibuprofen while on Eliquis Bathing/ Shower/ Hygiene May shower and wash wound with soap and water. - Use antibacterial soap, do not use peroxide or alcohol Edema Control - Lymphedema / SCD / Other Elevate legs to the level of the heart or above for 30 minutes daily and/or when sitting, a frequency of: - throughout the day Avoid standing for long periods of time. Patient to wear own compression stockings every day. - Use 15-72mHg Compression stocking for right leg. Additional Orders / Instructions Stop/Decrease Smoking Follow Nutritious Diet Wound Treatment Wound #2 - Lower Leg Wound Laterality: Left, Anterior Cleanser: Soap and Water 1 x Per  Day/30 Days Discharge Instructions: May shower and wash wound with dial antibacterial soap and water prior to dressing change. Cleanser: Wound Cleanser 1 x Per Day/30 Days Discharge Instructions: Cleanse the wound with wound cleanser prior to applying a clean dressing using gauze sponges, not tissue or cotton balls. Peri-Wound Care: Triamcinolone 15 (g) 1 x Per Day/30 Days Discharge Instructions: Use triamcinolone 15 (g) as directed Peri-Wound Care: Sween Lotion (Moisturizing lotion) 1 x Per Day/30 Days Discharge Instructions: Apply moisturizing lotion as directed Topical: Triamcinolone 1 x Per Day/30 Days Discharge Instructions: Apply Triamcinolone as directed Prim Dressing: KerraCel Ag Gelling Fiber Dressing, 2x2 in (silver alginate) 1 x Per Day/30 Days ary Discharge Instructions: Apply silver alginate to wound bed as instructed Secondary Dressing: ABD Pad, 8x10 1 x Per Day/30 Days Discharge Instructions: Apply over primary dressing as directed. Compression Wrap: Kerlix Roll 4.5x3.1 (in/yd) 1 x Per Day/30 Days Discharge Instructions: Apply Kerlix and Coban compression as directed. Compression Wrap: Coban Self-Adherent Wrap 4x5 (in/yd) 1 x Per Day/30 Days Discharge Instructions: Apply over Kerlix as directed. Electronic Signature(s) Signed: 08/26/2021 4:22:37 PM By: SWorthy KeelerPA-C Signed: 08/26/2021 5:13:46 PM By: BLorrin JacksonEntered By: BLorrin Jacksonon 08/26/2021 16:20:41 -------------------------------------------------------------------------------- Problem List  Details Patient Name: Date of Service: Russell Collier 08/26/2021 3:30 PM Medical Record Number: [id number] Patient Account Number: [id number] Date of Birth/Sex: Treating RN: 09-09-1960 (61 y.o. 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/21/2023 No Yes I87.333 Chronic venous hypertension (idiopathic) with ulcer and inflammation of 08/12/2021 No Yes bilateral lower extremity E11.622 Type 2 diabetes mellitus with other skin ulcer 08/12/2021 No Yes I25.10 Atherosclerotic heart disease of native coronary artery without angina pectoris 08/12/2021 No Yes Inactive Problems Resolved Problems Electronic Signature(s) Signed: 08/26/2021 3:53:07 PM By: Lorrin Jackson Signed: 08/26/2021 4:22:37 PM By: Worthy Keeler PA-C Previous Signature: 08/26/2021 3:16:26 PM Version By: Worthy Keeler PA-C Entered By: Lorrin Jackson on 08/26/2021 15:53:07 -------------------------------------------------------------------------------- Progress Note Details Patient Name: Date of Service: Russell Collier, [name] 08/26/2021 3:30 PM Medical Record Number: [id number] Patient Account Number: [id number] Date of Birth/Sex: Treating RN: [birth year] (61 y.o. Russell Collier Primary Care Provider: Garner Gavel HN Other Clinician: Referring Provider: Treating Provider/Extender: Clearance Coots, JO HN Weeks in Treatment: 2 Subjective Chief Complaint Information obtained from Patient Left anterior LE Ulcer History of Present Illness (HPI) ADMISSION 09/14/2018 This is a 61 year old man who traumatized his left anterior tibia area on the bathtub. He describes this initially as a bruise that subsequently reopened. He has been seen in the ER several times and has had at least 3 courses of antibiotics including doxy and Keflex twice. He has been using Neosporin and peroxide on this. Past medical history includes obstructive sleep apnea, cellulitis, hypertension, coronary artery disease, peripheral neuropathy [nondiabetic], currently on a heart murmur for atypical chest  pain and bradycardia. 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 ARTERIAL STUDIES done in March 2020 showed an ABI on the left of 0.93, TBI on the left slightly reduced at 0.42. On the right his ABI was 0.95 with a TBI of 0.51 he has monophasic waveforms in the lower extremities bilaterally. Interpreted on the left this showing mild left lower extremity arterial disease as well as on the right. 09/25/18-Patient returns at 1 week to the clinic, noted that arterial studies done in March 2020. We have been using Iodoflex to the wound on the left leg, patient has significant degree of pain and declines to have any debridement 8/21; the patient has not been here in 3 weeks. Since he was last here he was admitted to hospital on 8/4. He was felt to have stable angina. He has since been to his cardiologist. I note that he has an EF of about 48%. He has a venous wound on the left anterior mid tibia area probably with lymphedema. We have been using Iodoflex. He does not tolerate debridement well. He tells me that his cardiologist has been adjusting his Lasix and then the wraps fall down he tries to pull them up. He is describing pain making it difficult for him to function. 9/3; patient is was hospitalized since he was last here with COPD exacerbation. Came in with Aquacel Ag on the wound. The wound actually looks improved. 9/17; patient was apparently in the ER again with breathing issues and blood pressure issues. He was not admitted. We are using Sorbact of  the wound with nice improvement dimensions are better 10/1; the patient's wound is smaller and appears to have a reasonable healthy surface. We are using Sorbact 10/8; the wound is smaller although there is still an area with depth and necrotic debris at the base. We are using Sorbact 10/15; not much change in the wound area. We have been using Sorbact. 10/22; small wound area using endoform since last week 11/5; Mr. Dube arrived today in a healed state.  Left leg and wrapped. He still does not have compression stockings Readmission: 08-12-2021 upon evaluation today patient presents for evaluation here in the clinic he has been seen last in 2020 at that point Dr. Dellia Nims was the treating physician. Subsequently the patient tells me that he does have a history of peripheral vascular disease, chronic venous insufficiency, diabetes mellitus type 2, and coronary artery disease. Unfortunately he has had some issues with his right leg he recently has been seen [facility] and they did perform an arteriogram on the right leg I believe even putting a stent in. With that being said that leg seems to be doing well he mainly just needs a compression sock. In regard to the left leg which is the one in question he tells me the same day he had a surgery he hit the left leg on a piece of furniture on the anterior shin. This caused a area that seems to be a hematoma present at this point. With that being said the patient is having significant discomfort. He tells me. He has been taking ibuprofen which he should not be doing I discussed that with him today. Tylenol is really only should be taken being on the Eliquis. He also tells me that he has not really been put anything on other than a Band-Aid I really think he may benefit from some light compression I do not want to do anything too tight until we get a good review of his arterial flow as his ABIs are not good we got here in the office today was 0.75 on the left 0.65 on the right and to be honest he did not have palpable pulses that I could find. He does not appear to have had an arterial study since 2020 as noted above by Dr. Dellia Nims. 08-19-2021 upon evaluation today patient appears to be doing okay in regard to his wound although he does appear to have some fluid buildup underneath this area. Again I am a little reluctant to try to open this up right now due to the fact that the patient does not  appear to have good blood flow based on our initial inspection during the clinic evaluation today. Again he does see his vascular surgeon today. We did not have any palpable pulses that we could find last time I saw him and overall I think that he probably just needs to be seen in order to evaluate and see where things stand. 08-26-2021 upon evaluation today patient appears to be doing well currently in regard to his wound. Has been tolerating the dressing changes without complication. Fortunately we do not have a big open area but he does have some fluid underneath in the center this looks like it might start draining on its own soon which would be ideal. With that being said right now he has been using the Curlex and Coban wrap which she does tolerate okay. He is going for an arteriogram on 14 July. After that if we need to  open this we should be able to presuming they get his blood flow going appropriately. Allergies Chantix (Reaction: anaphylaxis), bupropion, Nicoderm CQ, Bactrim Objective Constitutional Chronically ill appearing but in no apparent acute distress. Vitals Time Taken: 4:05 PM, Height: 70 in, Weight: 315 lbs, BMI: 45.2, Temperature: 98.0 F, Pulse: 67 bpm, Respiratory Rate: 18 breaths/min, Blood Pressure: 113/70 mmHg. Respiratory normal breathing without difficulty. Psychiatric this patient is able to make decisions and demonstrates good insight into disease process. Alert and Oriented x 3. pleasant and cooperative. General Notes: Upon inspection patient's wound bed actually showed signs of good granulation and epithelization at this point. Fortunately I do not see any evidence of active infection locally or systemically at this time which is great news. He unfortunately did have a rash from the Bactrim therefore he was switched away from Bactrim placed on doxycycline that was when he went to the ER last night due to the excessive itching. Integumentary (Hair, Skin) Wound #2  status is Open. Original cause of wound was Trauma. The date acquired was: 08/03/2021. The wound has been in treatment 2 weeks. The wound is located on the Left,Anterior Lower Leg. The wound measures 0.4cm length x 0.3cm width x 0.2cm depth; 0.094cm^2 area and 0.019cm^3 volume. There is Fat Layer (Subcutaneous Tissue) exposed. There is no tunneling or undermining noted. There is a medium amount of serous drainage noted. The wound margin is distinct with the outline attached to the wound base. There is large (67-100%) red granulation within the wound bed. There is a small (1-33%) amount of necrotic tissue within the wound bed including Adherent Slough. Assessment Active Problems ICD-10 Other specified peripheral vascular diseases Non-pressure chronic ulcer of other part of left lower leg with fat layer exposed Chronic venous hypertension (idiopathic) with ulcer and inflammation of bilateral lower extremity Type 2 diabetes mellitus with other skin ulcer Atherosclerotic heart disease of native coronary artery without angina pectoris Plan Follow-up Appointments: Return Appointment in 1 week. - 09/02/21 @ 10:15am with Orland Jarred, RN (Room 7) Other: - -Do not use Ibuprofen while on Eliquis Bathing/ Shower/ Hygiene: May shower and wash wound with soap and water. - Use antibacterial soap, do not use peroxide or alcohol Edema Control - Lymphedema / SCD / Other: Elevate legs to the level of the heart or above for 30 minutes daily and/or when sitting, a frequency of: - throughout the day Avoid standing for long periods of time. Patient to wear own compression stockings every day. - Use 15-71mHg Compression stocking for right leg. Additional Orders / Instructions: Stop/Decrease Smoking Follow Nutritious Diet WOUND #2: - Lower Leg Wound Laterality: Left, Anterior Cleanser: Soap and Water 1 x Per Day/30 Days Discharge Instructions: May shower and wash wound with dial antibacterial soap and water  prior to dressing change. Cleanser: Wound Cleanser 1 x Per Day/30 Days Discharge Instructions: Cleanse the wound with wound cleanser prior to applying a clean dressing using gauze sponges, not tissue or cotton balls. Peri-Wound Care: Triamcinolone 15 (g) 1 x Per Day/30 Days Discharge Instructions: Use triamcinolone 15 (g) as directed Peri-Wound Care: Sween Lotion (Moisturizing lotion) 1 x Per Day/30 Days Discharge Instructions: Apply moisturizing lotion as directed Topical: Triamcinolone 1 x Per Day/30 Days Discharge Instructions: Apply Triamcinolone as directed Prim Dressing: KerraCel Ag Gelling Fiber Dressing, 2x2 in (silver alginate) 1 x Per Day/30 Days ary Discharge Instructions: Apply silver alginate to wound bed as instructed Secondary Dressing: ABD Pad, 8x10 1 x Per Day/30 Days Discharge Instructions: Apply over primary  dressing as directed. Com pression Wrap: Kerlix Roll 4.5x3.1 (in/yd) 1 x Per Day/30 Days Discharge Instructions: Apply Kerlix and Coban compression as directed. Com pression Wrap: Coban Self-Adherent Wrap 4x5 (in/yd) 1 x Per Day/30 Days Discharge Instructions: Apply over Kerlix as directed. 1. I am good recommend currently that we going continue with the doxycycline prescribed by the ER I think that is appropriate. 2. I am also can recommend that we have the patient continue to monitor for any signs of worsening or infection. Office if anything changes he should let me know. 3. I am also can recommend that the patient should continue with the silver alginate dressing followed by the Curlex and Coban wrap which I think is doing quite well at this point. We will see patient back for reevaluation in 1 week here in the clinic. If anything worsens or changes patient will contact our office for additional recommendations. Electronic Signature(s) Signed: 08/26/2021 4:21:59 PM By: Worthy Keeler PA-C Entered By: Worthy Keeler on 08/26/2021  16:21:59 -------------------------------------------------------------------------------- SuperBill Details Patient Name: Date of Service: [place] 08/26/2021 Medical Record Number: [id number] Patient Account Number: [id number] Date of Birth/Sex: Treating RN: 09/01/1960 (61 y.o. Russell Collier Primary Care Provider: Garner Gavel HN Other Clinician: Referring Provider: Treating Provider/Extender: Delma Freeze HN Weeks in Treatment: 2 Diagnosis Coding ICD-10 Codes Code Description I73.89 Other specified peripheral vascular diseases [phone] Non-pressure chronic ulcer of other part of left lower leg with fat layer exposed I87.333 Chronic venous hypertension (idiopathic) with ulcer and inflammation of bilateral lower extremity E11.622 Type 2 diabetes mellitus with other skin ulcer I25.10 Atherosclerotic heart disease of native coronary artery without angina pectoris Facility Procedures CPT4 Code: [id number] Description: 99213 - WOUND CARE VISIT-LEV 3 EST PT Modifier: Quantity: 1 Physician Procedures : CPT4 Code Description Modifier [id number] - WC PHYS LEVEL 3 - EST PT ICD-10 Diagnosis Description I73.89 Other specified peripheral vascular diseases L97.822 Non-pressure chronic ulcer of other part of left lower leg with fat layer exposed I87.333  Chronic venous hypertension (idiopathic) with ulcer and inflammation of bilateral lower extremity E11.622 Type 2 diabetes mellitus with other skin ulcer Quantity: 1 Electronic Signature(s) Signed: 08/26/2021 4:34:16 PM By: Worthy Keeler PA-C Signed: 08/26/2021 5:13:46 PM By: Lorrin Jackson Previous Signature: 08/26/2021 4:22:12 PM Version By: Worthy Keeler PA-C Entered By: Lorrin Jackson on 08/26/2021 16:30:41

## 2021-08-26 NOTE — Progress Notes (Addendum)
BJORN, HALLAS ([id number]) Visit Report for 08/26/2021 Allergy List Details Patient Name: Date of Service: Russell Collier 08/26/2021 3:30 PM Medical Record Number: [id number] Patient Account Number: [id number] Date of Birth/Sex: Treating RN: February 15, 1961 (61 y.o. [name] Primary Care Navdeep Fessenden: Garner Gavel HN Other Clinician: Referring Moris Ratchford: Treating Dvid Pendry/Extender: Clearance Coots, JO HN Weeks in Treatment: 2 Allergies Active Allergies Chantix Reaction: anaphylaxis bupropion Nicoderm CQ Bactrim Allergy Notes Electronic Signature(s) Signed: 08/26/2021 5:13:46 PM By: Lorrin Jackson Entered By: Lorrin Jackson on 08/26/2021 16:04:21 -------------------------------------------------------------------------------- Arrival Information Details Patient Name: Date of Service: Russell Collier, Russell Collier 08/26/2021 3:30 PM Medical Record Number: [id number] Patient Account Number: [id number] Date of Birth/Sex: Treating RN: 15-Nov-1960 (61 y.o. [name] Primary Care Karolyne Timmons: Garner Gavel HN Other Clinician: Referring Pura Picinich: Treating Yee Gangi/Extender: Delma Freeze HN Weeks in Treatment: 2 Visit Information Patient Arrived: Walker Arrival Time: 16:01 Transfer Assistance: None Patient Requires Transmission-Based Precautions: No Patient Has Alerts: Yes Patient Alerts: Patient on Blood Thinner History Since Last Visit Added or deleted any medications: No Any new allergies or adverse reactions: No Had a fall or experienced change in activities of daily living that may affect risk of falls: No Signs or symptoms of abuse/neglect since last visito No Hospitalized since last visit: No Implantable device outside of the clinic excluding cellular tissue based products placed in the center since last visit: No Has Dressing in Place as Prescribed: Yes Electronic Signature(s) Signed: 08/26/2021 5:13:46 PM By: Lorrin Jackson Entered By: Lorrin Jackson on  08/26/2021 16:02:16 -------------------------------------------------------------------------------- Clinic Level of Care Assessment Details Patient Name: Date of Service: Russell Collier 08/26/2021 3:30 PM Medical Record Number: [id number] Patient Account Number: [id number] Date of Birth/Sex: Treating RN: 1960/07/23 (61 y.o. [name] Primary Care Janaiyah Blackard: Garner Gavel HN Other Clinician: Referring Waylin Dorko: Treating Jaydalee Bardwell/Extender: Clearance Coots, JO HN Weeks in Treatment: 2 Clinic Level of Care Assessment Items TOOL 4 Quantity Score X- 1 0 Use when only an EandM is performed on FOLLOW-UP visit ASSESSMENTS - Nursing Assessment / Reassessment X- 1 10 Reassessment of Co-morbidities (includes updates in patient status) X- 1 5 Reassessment of Adherence to Treatment Plan ASSESSMENTS - Wound and Skin A ssessment / Reassessment X - Simple Wound Assessment / Reassessment - one wound 1 5 '[]'$  - 0 Complex Wound Assessment / Reassessment - multiple wounds '[]'$  - 0 Dermatologic / Skin Assessment (not related to wound area) ASSESSMENTS - Focused Assessment '[]'$  - 0 Circumferential Edema Measurements - multi extremities '[]'$  - 0 Nutritional Assessment / Counseling / Intervention '[]'$  - 0 Lower Extremity Assessment (monofilament, tuning fork, pulses) '[]'$  - 0 Peripheral Arterial Disease Assessment (using hand held doppler) ASSESSMENTS - Ostomy and/or Continence Assessment and Care '[]'$  - 0 Incontinence Assessment and Management '[]'$  - 0 Ostomy Care Assessment and Management (repouching, etc.) PROCESS - Coordination of Care '[]'$  - 0 Simple Patient / Family Education for ongoing care X- 1 20 Complex (extensive) Patient / Family Education for ongoing care '[]'$  - 0 Staff obtains Programmer, systems, Records, T Results / Process Orders est '[]'$  - 0 Staff telephones HHA, Nursing Homes / Clarify orders / etc '[]'$  - 0 Routine Transfer to another Facility (non-emergent condition) '[]'$  - 0 Routine  Hospital Admission (non-emergent condition) '[]'$  - 0 New Admissions / Biomedical engineer / Ordering NPWT Apligraf, etc. , '[]'$  - 0 Emergency Hospital Admission (emergent condition) '[]'$  - 0 Simple Discharge Coordination '[]'$  - 0  Complex (extensive) Discharge Coordination PROCESS - Special Needs '[]'$  - 0 Pediatric / Minor Patient Management '[]'$  - 0 Isolation Patient Management '[]'$  - 0 Hearing / Language / Visual special needs '[]'$  - 0 Assessment of Community assistance (transportation, D/C planning, etc.) '[]'$  - 0 Additional assistance / Altered mentation '[]'$  - 0 Support Surface(s) Assessment (bed, cushion, seat, etc.) INTERVENTIONS - Wound Cleansing / Measurement X - Simple Wound Cleansing - one wound 1 5 '[]'$  - 0 Complex Wound Cleansing - multiple wounds X- 1 5 Wound Imaging (photographs - any number of wounds) '[]'$  - 0 Wound Tracing (instead of photographs) X- 1 5 Simple Wound Measurement - one wound '[]'$  - 0 Complex Wound Measurement - multiple wounds INTERVENTIONS - Wound Dressings '[]'$  - 0 Small Wound Dressing one or multiple wounds '[]'$  - 0 Medium Wound Dressing one or multiple wounds X- 1 20 Large Wound Dressing one or multiple wounds '[]'$  - 0 Application of Medications - topical '[]'$  - 0 Application of Medications - injection INTERVENTIONS - Miscellaneous '[]'$  - 0 External ear exam '[]'$  - 0 Specimen Collection (cultures, biopsies, blood, body fluids, etc.) '[]'$  - 0 Specimen(s) / Culture(s) sent or taken to Lab for analysis '[]'$  - 0 Patient Transfer (multiple staff / Civil Service fast streamer / Similar devices) '[]'$  - 0 Simple Staple / Suture removal (25 or less) '[]'$  - 0 Complex Staple / Suture removal (26 or more) '[]'$  - 0 Hypo / Hyperglycemic Management (close monitor of Blood Glucose) '[]'$  - 0 Ankle / Brachial Index (ABI) - do not check if billed separately X- 1 5 Vital Signs Has the patient been seen at the hospital within the last three years: Yes Total Score: 80 Level Of Care:  New/Established - Level 3 Electronic Signature(s) Signed: 08/26/2021 5:13:46 PM By: Lorrin Jackson Entered By: Lorrin Jackson on 08/26/2021 16:30:32 -------------------------------------------------------------------------------- Encounter Discharge Information Details Patient Name: Date of Service: Russell Collier, Russell Collier 08/26/2021 3:30 PM Medical Record Number: [id number] Patient Account Number: [id number] Date of Birth/Sex: Treating RN: [birth year] (61 y.o. [name] Primary Care Marjan Rosman: Garner Gavel HN Other Clinician: Referring Ceejay Kegley: Treating Joyice Magda/Extender: Delma Freeze HN Weeks in Treatment: 2 Encounter Discharge Information Items Discharge Condition: Stable Ambulatory Status: Walker Discharge Destination: Home Transportation: Private Auto Schedule Follow-up Appointment: Yes Clinical Summary of Care: Provided on 08/26/2021 Form Type Recipient Paper Patient Patient Electronic Signature(s) Signed: 08/26/2021 5:13:46 PM By: Lorrin Jackson Entered By: Lorrin Jackson on 08/26/2021 16:31:29 -------------------------------------------------------------------------------- Lower Extremity Assessment Details Patient Name: Date of Service: Russell Collier 08/26/2021 3:30 PM Medical Record Number: [id number] Patient Account Number: [id number] Date of Birth/Sex: Treating RN: 01/10/1961 (61 y.o. [name] Primary Care Brittony Billick: Garner Gavel HN Other Clinician: Referring Eknoor Novack: Treating Amiri Riechers/Extender: Clearance Coots, JO HN Weeks in Treatment: 2 Edema Assessment Assessed: [Left: Yes] [Right: No] Edema: [Left: Ye] [Right: s] Calf Left: Right: Point of Measurement: 37 cm From Medial Instep 44.8 cm Ankle Left: Right: Point of Measurement: 10 cm From Medial Instep 26 cm Vascular Assessment Pulses: Dorsalis Pedis Palpable: [Left:Yes] Electronic Signature(s) Signed: 08/26/2021 5:13:46 PM By: Lorrin Jackson Entered By: Lorrin Jackson on  08/26/2021 16:11:02 -------------------------------------------------------------------------------- Multi-Disciplinary Care Plan Details Patient Name: Date of Service: Russell Collier, Russell Collier 08/26/2021 3:30 PM Medical Record Number: [id number] Patient Account Number: [id number] Date of Birth/Sex: Treating RN: 1960-04-08 (61 y.o. [name] Primary Care Kima Malenfant: Garner Gavel HN Other Clinician: Referring Brittini Brubeck: Treating Lindley Hiney/Extender: Worthy Keeler  NO RBERT, JO HN Weeks in Treatment: 2 Active Inactive Tissue Oxygenation Nursing Diagnoses: Actual ineffective tissue perfusion; peripheral (select once diagnosis is confirmed) Goals: Patient/caregiver will verbalize understanding of disease process and disease management Date Initiated: 08/12/2021 Target Resolution Date: 09/09/2021 Goal Status: Active Interventions: Assess peripheral arterial status upon admission and as needed Provide education on tissue oxygenation and ischemia Notes: Wound/Skin Impairment Nursing Diagnoses: Impaired tissue integrity Goals: Patient/caregiver will verbalize understanding of skin care regimen Date Initiated: 08/12/2021 Target Resolution Date: 09/09/2021 Goal Status: Active Ulcer/skin breakdown will have a volume reduction of 30% by week 4 Date Initiated: 08/12/2021 Target Resolution Date: 09/09/2021 Goal Status: Active Interventions: Assess patient/caregiver ability to obtain necessary supplies Assess patient/caregiver ability to perform ulcer/skin care regimen upon admission and as needed Assess ulceration(s) every visit Provide education on smoking Provide education on ulcer and skin care Treatment Activities: Smoking cessation education : 08/12/2021 Topical wound management initiated : 08/12/2021 Notes: Electronic Signature(s) Signed: 08/26/2021 3:54:34 PM By: Lorrin Jackson Entered By: Lorrin Jackson on 08/26/2021  15:54:34 -------------------------------------------------------------------------------- Pain Assessment Details Patient Name: Date of Service: Russell Collier, Russell Collier 08/26/2021 3:30 PM Medical Record Number: [id number] Patient Account Number: [id number] Date of Birth/Sex: Treating RN: [birth year] (61 y.o. [name] Primary Care Melainie Krinsky: Garner Gavel HN Other Clinician: Referring Alter Moss: Treating Leaner Morici/Extender: Clearance Coots, JO HN Weeks in Treatment: 2 Active Problems Location of Pain Severity and Description of Pain Patient Has Paino Yes Site Locations Pain Location: Pain Location: Pain in Ulcers With Dressing Change: Yes Duration of the Pain. Constant / Intermittento Intermittent Rate the pain. Current Pain Level: 7 Character of Pain Describe the Pain: Burning, Tender, Throbbing Pain Management and Medication Current Pain Management: Medication: Yes Cold Application: No Rest: Yes Massage: No Activity: No T.E.N.S.: No Heat Application: No Leg drop or elevation: No Is the Current Pain Management Adequate: Adequate How does your wound impact your activities of daily livingo Sleep: No Bathing: No Appetite: No Relationship With Others: No Bladder Continence: No Emotions: No Bowel Continence: No Work: No Toileting: No Drive: No Dressing: No Hobbies: No Electronic Signature(s) Signed: 08/26/2021 5:13:46 PM By: Lorrin Jackson Entered By: Lorrin Jackson on 08/26/2021 16:07:26 -------------------------------------------------------------------------------- Patient/Caregiver Education Details Patient Name: Date of Service: Russell Collier 7/5/2023andnbsp3:30 PM Medical Record Number: [id number] Patient Account Number: [id number] Date of Birth/Gender: Treating RN: Jan 22, 1961 (61 y.o. [name] Primary Care Physician: Garner Gavel HN Other Clinician: Referring Physician: Treating Physician/Extender: Delma Freeze  HN Weeks in Treatment: 2 Education Assessment Education Provided To: Patient Education Topics Provided Smoking and Wound Healing: Methods: Explain/Verbal, Printed Wound/Skin Impairment: Methods: Demonstration, Explain/Verbal, Printed Responses: State content correctly Electronic Signature(s) Signed: 08/26/2021 5:13:46 PM By: Lorrin Jackson Entered By: Lorrin Jackson on 08/26/2021 15:54:55 -------------------------------------------------------------------------------- Wound Assessment Details Patient Name: Date of Service: Russell Collier, Russell Collier 08/26/2021 3:30 PM Medical Record Number: [id number] Patient Account Number: [id number] Date of Birth/Sex: Treating RN: 01/12/1961 (61 y.o. [name] Primary Care Merton Wadlow: Garner Gavel HN Other Clinician: Referring Vaughn Frieze: Treating Lyndell Gillyard/Extender: Clearance Coots, JO HN Weeks in Treatment: 2 Wound Status Wound Number: 2 Primary Arterial Insufficiency Ulcer Etiology: Wound Location: Left, Anterior Lower Leg Wound Open Wounding Event: Trauma Status: Date Acquired: 08/03/2021 Comorbid Hemophilia, Asthma, Sleep Apnea, Angina, Congestive Heart Weeks Of Treatment: 2 History: Failure, Coronary Artery Disease, Hypertension, Peripheral Arterial Clustered Wound: No Disease, Type II Diabetes, Osteoarthritis, Neuropathy Photos Wound Measurements Length: (cm) 0.4  Width: (cm) 0.3 Depth: (cm) 0.2 Area: (cm) 0.094 Volume: (cm) 0.019 % Reduction in Area: 94.7% % Reduction in Volume: 94.6% Epithelialization: Large (67-100%) Tunneling: No Undermining: No Wound Description Classification: Full Thickness Without Exposed Support Structures Wound Margin: Distinct, outline attached Exudate Amount: Medium Exudate Type: Serous Exudate Color: amber Foul Odor After Cleansing: No Slough/Fibrino No Wound Bed Granulation Amount: Large (67-100%) Exposed Structure Granulation Quality: Red Fascia Exposed: No Necrotic Amount: Small  (1-33%) Fat Layer (Subcutaneous Tissue) Exposed: Yes Necrotic Quality: Adherent Slough Tendon Exposed: No Muscle Exposed: No Joint Exposed: No Bone Exposed: No Treatment Notes Wound #2 (Lower Leg) Wound Laterality: Left, Anterior Cleanser Soap and Water Discharge Instruction: May shower and wash wound with dial antibacterial soap and water prior to dressing change. Wound Cleanser Discharge Instruction: Cleanse the wound with wound cleanser prior to applying a clean dressing using gauze sponges, not tissue or cotton balls. Peri-Wound Care Triamcinolone 15 (g) Discharge Instruction: Use triamcinolone 15 (g) as directed Sween Lotion (Moisturizing lotion) Discharge Instruction: Apply moisturizing lotion as directed Topical Triamcinolone Discharge Instruction: Apply Triamcinolone as directed Primary Dressing KerraCel Ag Gelling Fiber Dressing, 2x2 in (silver alginate) Discharge Instruction: Apply silver alginate to wound bed as instructed Secondary Dressing ABD Pad, 8x10 Discharge Instruction: Apply over primary dressing as directed. Secured With Compression Wrap Kerlix Roll 4.5x3.1 (in/yd) Discharge Instruction: Apply Kerlix and Coban compression as directed. Coban Self-Adherent Wrap 4x5 (in/yd) Discharge Instruction: Apply over Kerlix as directed. Compression Stockings Add-Ons Electronic Signature(s) Signed: 08/26/2021 5:13:46 PM By: Lorrin Jackson Entered By: Lorrin Jackson on 08/26/2021 16:12:36 -------------------------------------------------------------------------------- Vitals Details Patient Name: Date of Service: Russell Collier, Russell Collier 08/26/2021 3:30 PM Medical Record Number: [id number] Patient Account Number: [id number] Date of Birth/Sex: Treating RN: 09/22/1960 (61 y.o. [name] Primary Care Woodford Strege: Garner Gavel HN Other Clinician: Referring Isaiah Cianci: Treating Cristin Szatkowski/Extender: Clearance Coots, JO HN Weeks in Treatment: 2 Vital Signs Time  Taken: 16:05 Temperature (F): 98.0 Height (in): 70 Pulse (bpm): 67 Weight (lbs): 315 Respiratory Rate (breaths/min): 18 Body Mass Index (BMI): 45.2 Blood Pressure (mmHg): 113/70 Reference Range: 80 - 120 mg / dl Electronic Signature(s) Signed: 08/26/2021 5:13:46 PM By: Lorrin Jackson Entered By: Lorrin Jackson on 08/26/2021 16:06:34

## 2021-08-27 ENCOUNTER — Other Ambulatory Visit (HOSPITAL_COMMUNITY): Payer: Self-pay

## 2021-08-27 ENCOUNTER — Ambulatory Visit (HOSPITAL_COMMUNITY)
Admission: RE | Admit: 2021-08-27 | Discharge: 2021-08-27 | Disposition: A | Discharge status: Home / Self Care | Payer: Medicaid Other | Source: Ambulatory Visit | Attending: Cardiology | Admitting: Cardiology

## 2021-08-27 DIAGNOSIS — E854 Organ-limited amyloidosis: Secondary | ICD-10-CM | POA: Diagnosis present

## 2021-08-27 DIAGNOSIS — G4733 Obstructive sleep apnea (adult) (pediatric): Secondary | ICD-10-CM | POA: Diagnosis not present

## 2021-08-27 DIAGNOSIS — E859 Amyloidosis, unspecified: Secondary | ICD-10-CM | POA: Diagnosis not present

## 2021-08-27 DIAGNOSIS — I10 Essential (primary) hypertension: Secondary | ICD-10-CM | POA: Insufficient documentation

## 2021-08-27 DIAGNOSIS — I5022 Chronic systolic (congestive) heart failure: Secondary | ICD-10-CM | POA: Diagnosis not present

## 2021-08-27 DIAGNOSIS — G63 Polyneuropathy in diseases classified elsewhere: Secondary | ICD-10-CM

## 2021-08-27 DIAGNOSIS — Z720 Tobacco use: Secondary | ICD-10-CM | POA: Diagnosis not present

## 2021-08-27 DIAGNOSIS — E851 Neuropathic heredofamilial amyloidosis: Secondary | ICD-10-CM | POA: Diagnosis not present

## 2021-08-27 DIAGNOSIS — M5136 Other intervertebral disc degeneration, lumbar region: Secondary | ICD-10-CM | POA: Diagnosis not present

## 2021-08-27 DIAGNOSIS — I5030 Unspecified diastolic (congestive) heart failure: Secondary | ICD-10-CM | POA: Diagnosis not present

## 2021-08-27 MED ORDER — VUTRISIRAN SODIUM 25 MG/0.5ML ~~LOC~~ SOSY
25.0000 mg | PREFILLED_SYRINGE | Freq: Once | SUBCUTANEOUS | Status: AC
  Administered 2021-08-27: 25 mg via SUBCUTANEOUS

## 2021-08-27 NOTE — Progress Notes (Signed)
    Advanced Heart Failure Clinic Note   PCP: [name], MD PCP-Cardiologist: Fransico Him, MD  Neurology: Dr Posey Pronto HF Cardiology: Dr. Haroldine Laws  HPI:  Mr Kynard is a 61 y.o. 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    Echo 01/2018, EF 55-60%, nonobstructive cardiac cath 01/2018 (20% prox LAD, 60% mid Cx, 50% post atrio lesion) h/o bradycardia w/ ? 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    PYP scan 09/2018 and was an equivocal study. No visual increase in PYP uptake, but ratio was between 1-1.5. Based clinical suspicion, he was ordered to undergo genetic testing and results + for TTR gene.   He was seen by neurology, neuropathy associated with TTR.    Admitted 07/27/19 with atypical chest pain and wheezing.  Troponins were low and flat, EKG reassuring.   A CTA was performed, which showed a possible segmental PE.  Since he does have a history of PE, he was continued on IV heparin and transitioned to Eliquis on 07/28/19. Echo 07/27/19 EF 55% RV normal.    At his HF follow up 12/2019 Eliquis was decreased due to frequent nose bleeds, mobility limited by back and knee pain.   S/p L3-S1 decompression 03/2020, uneventful hospitalization at [facility].   Graduated from paramedicine 09/2020.   Follow up with Neurology 03/2021, recommended starting Amvuttra and recommending restarting PT.  Echo 04/2021 EF 50-55% RV ok   Today he returns to HF clinic for injection of Amvuttra. Overall feeling well today. Feels that his neuropathy has improved somewhat since starting the Amvuttra. No contraindications to injection.    Assessment/Plan: 1. Familial TTR Cardiac Amyloidosis:  - PYP scan 09/2018 was  equivocal. + genetic testing. Has TTR gene, heterozygous.  - He is on tafamadis and referred for family genetic testing. No family members with TTR.  - Based on PYP scan not thought to have extensive cardiac involvement at this point.  - He has numberous neuropathic symptoms. Followed by Dr. Narda Amber in Neurology for neuropathy - Continue Tafamadis. 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 Patient tolerated injection well. Provided patient counseling on Amvuttra. Most common side effects are injection site reactions, arthralgias, dyspnea and vitamin A deficiency. Patient is aware to return to clinic every 3 months for repeat injection.   - Continue vitamin A supplement 8000 IU daily. Amvuttra decreases serum vitamin A levels.  Follow up 3 months for repeat Amvuttra injection.    Audry Riles, PharmD, BCPS, BCCP, CPP Heart Failure Clinic Pharmacist [phone]

## 2021-08-27 NOTE — Patient Instructions (Signed)
It was a pleasure seeing you today!  MEDICATIONS: -No medication changes today -Call if you have questions about your medications.   NEXT APPOINTMENT: Return to clinic in 3 months for repeat Amvuttra injection.  In general, to take care of your heart failure: -Limit your fluid intake to 2 Liters (half-gallon) per day.   -Limit your salt intake to ideally 2-3 grams (2000-3000 mg) per day. -Weigh yourself daily and record, and bring that "weight diary" to your next appointment.  (Weight gain of 2-3 pounds in 1 day typically means fluid weight.) -The medications for your heart are to help your heart and help you live longer.   -Please contact us before stopping any of your heart medications.  Call the clinic at [phone] with questions or to reschedule future appointments.  

## 2021-08-28 DIAGNOSIS — M5136 Other intervertebral disc degeneration, lumbar region: Secondary | ICD-10-CM | POA: Diagnosis not present

## 2021-08-31 DIAGNOSIS — M5136 Other intervertebral disc degeneration, lumbar region: Secondary | ICD-10-CM | POA: Diagnosis not present

## 2021-09-01 DIAGNOSIS — M5136 Other intervertebral disc degeneration, lumbar region: Secondary | ICD-10-CM | POA: Diagnosis not present

## 2021-09-02 ENCOUNTER — Encounter (HOSPITAL_BASED_OUTPATIENT_CLINIC_OR_DEPARTMENT_OTHER): Payer: Medicaid Other | Admitting: Physician Assistant

## 2021-09-02 DIAGNOSIS — I70248 Atherosclerosis of native arteries of left leg with ulceration of other part of lower left leg: Secondary | ICD-10-CM | POA: Diagnosis not present

## 2021-09-02 DIAGNOSIS — M5136 Other intervertebral disc degeneration, lumbar region: Secondary | ICD-10-CM | POA: Diagnosis not present

## 2021-09-02 DIAGNOSIS — L97822 Non-pressure chronic ulcer of other part of left lower leg with fat layer exposed: Secondary | ICD-10-CM | POA: Diagnosis not present

## 2021-09-02 DIAGNOSIS — E11622 Type 2 diabetes mellitus with other skin ulcer: Secondary | ICD-10-CM | POA: Diagnosis not present

## 2021-09-02 NOTE — Progress Notes (Addendum)
Russell, Collier ([id number]) Visit Report for 09/02/2021 Chief Complaint Document Details Patient Name: Date of Service: Russell Collier 09/02/2021 10:15 A M Medical Record Number: [id number] Patient Account Number: [id number] Date of Birth/Sex: Treating RN: 06-Feb-1961 (61 y.o. [name] Primary Care Provider: Alen Bleacher Other Clinician: Referring Provider: Treating Provider/Extender: Clyda Hurdle in Treatment: 3 Information Obtained from: Patient Chief Complaint Left anterior LE Ulcer Electronic Signature(s) Signed: 09/02/2021 9:25:38 AM By: Worthy Keeler PA-C Entered By: Worthy Keeler on 09/02/2021 09:25:38 -------------------------------------------------------------------------------- Debridement Details Patient Name: Date of Service: [place]s, Russell Collier 09/02/2021 10:15 A M Medical Record Number: [id number] Patient Account Number: [id number] Date of Birth/Sex: Treating RN: 1960-03-28 (61 y.o. [name] Primary Care Provider: Alen Bleacher Other Clinician: Referring Provider: Treating Provider/Extender: Clyda Hurdle in Treatment: 3 Debridement Performed for Assessment: Wound #2 Left,Anterior Lower Leg Performed By: Physician Worthy Keeler, PA Debridement Type: Chemical/Enzymatic/Mechanical Agent Used: wound cleaner, gauze Severity of Tissue Pre Debridement: Fat layer exposed Level of Consciousness (Pre-procedure): Awake and Alert Pre-procedure Verification/Time Out Yes - 11:14 Taken: Start Time: 11:15 Pain Control: Lidocaine 4% Topical Solution Bleeding: Minimum Hemostasis Achieved: Pressure End Time: 11:19 Response to Treatment: Procedure was tolerated well Level of Consciousness (Post- Awake and Alert procedure): Post Debridement Measurements of Total Wound Length: (cm) 3 Width: (cm) 1.4 Depth: (cm) 0.2 Volume: (cm) 0.66 Character of Wound/Ulcer Post Debridement: Stable Severity of Tissue Post  Debridement: Fat layer exposed Post Procedure Diagnosis Same as Pre-procedure Electronic Signature(s) Signed: 09/02/2021 4:33:23 PM By: Worthy Keeler PA-C Signed: 09/02/2021 5:05:06 PM By: Lorrin Jackson Entered By: Lorrin Jackson on 09/02/2021 11:32:45 -------------------------------------------------------------------------------- HPI Details Patient Name: Date of Service: Russell Collier, Russell Collier 09/02/2021 10:15 A M Medical Record Number: [id number] Patient Account Number: [id number] Date of Birth/Sex: Treating RN: [birth year] (61 y.o. [name] Primary Care Provider: Alen Bleacher Other Clinician: Referring Provider: Treating Provider/Extender: Clyda Hurdle in Treatment: 3 History of Present Illness HPI Description: ADMISSION 09/14/2018 This is a 61 year old man who traumatized his left anterior tibia area on the bathtub. He describes this initially as a bruise that subsequently reopened. He has been seen in the ER several times and has had at least 3 courses of antibiotics including doxy and Keflex twice. He has been using Neosporin and peroxide on this. Past medical history includes obstructive sleep apnea, cellulitis, hypertension, coronary artery disease, peripheral neuropathy [nondiabetic], currently on a heart murmur for atypical chest pain and bradycardia. 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 ARTERIAL STUDIES done in March 2020 showed an ABI on the left of 0.93, TBI on the left slightly reduced at 0.42. On the right his ABI was 0.95 with a TBI of 0.51 he has monophasic waveforms in the lower extremities bilaterally. Interpreted on the left this showing mild left lower extremity arterial disease as well as on the right. 09/25/18-Patient returns at 1 week to the clinic, noted that arterial studies done in March 2020. We have been using Iodoflex to the wound on the left leg, patient has significant degree of pain and declines to have any debridement 8/21;  the patient has not been here in 3 weeks. Since he was last here he was admitted to hospital on 8/4. He was felt to have stable angina. He has since been to his cardiologist. I note that he has an EF of about 48%. He has a venous wound on the left anterior  mid tibia area probably with lymphedema. We have been using Iodoflex. He does not tolerate debridement well. He tells me that his cardiologist has been adjusting his Lasix and then the wraps fall down he tries to pull them up. He is describing pain making it difficult for him to function. 9/3; patient is was hospitalized since he was last here with COPD exacerbation. Came in with Aquacel Ag on the wound. The wound actually looks improved. 9/17; patient was apparently in the ER again with breathing issues and blood pressure issues. He was not admitted. We are using Sorbact of the wound with nice improvement dimensions are better 10/1; the patient's wound is smaller and appears to have a reasonable healthy surface. We are using Sorbact 10/8; the wound is smaller although there is still an area with depth and necrotic debris at the base. We are using Sorbact 10/15; not much change in the wound area. We have been using Sorbact. 10/22; small wound area using endoform since last week 11/5; Russell Collier arrived today in a healed state. Left leg and wrapped. He still does not have compression stockings Readmission: 08-12-2021 upon evaluation today patient presents for evaluation here in the clinic he has been seen last in 2020 at that point Dr. Dellia Nims was the treating physician. Subsequently the patient tells me that he does have a history of peripheral vascular disease, chronic venous insufficiency, diabetes mellitus type 2, and coronary artery disease. Unfortunately he has had some issues with his right leg he recently has been [facility] and they did perform an arteriogram on the right leg I believe even putting a stent in.  With that being said that leg seems to be doing well he mainly just needs a compression sock. In regard to the left leg which is the one in question he tells me the same day he had a surgery he hit the left leg on a piece of furniture on the anterior shin. This caused a area that seems to be a hematoma present at this point. With that being said the patient is having significant discomfort. He tells me. He has been taking ibuprofen which he should not be doing I discussed that with him today. Tylenol is really only should be taken being on the Eliquis. He also tells me that he has not really been put anything on other than a Band-Aid I really think he may benefit from some light compression I do not want to do anything too tight until we get a good review of his arterial flow as his ABIs are not good we got here in the office today was 0.75 on the left 0.65 on the right and to be honest he did not have palpable pulses that I could find. He does not appear to have had an arterial study since 2020 as noted above by Dr. Dellia Nims. 08-19-2021 upon evaluation today patient appears to be doing okay in regard to his wound although he does appear to have some fluid buildup underneath this area. Again I am a little reluctant to try to open this up right now due to the fact that the patient does not appear to have good blood flow based on our initial inspection during the clinic evaluation today. Again he does see his vascular surgeon today. We did not have any palpable pulses that we could find last time I saw him and overall I think that he probably just needs to be seen in order  to evaluate and see where things stand. 08-26-2021 upon evaluation today patient appears to be doing well currently in regard to his wound. Has been tolerating the dressing changes without complication. Fortunately we do not have a big open area but he does have some fluid underneath in the center this looks like it might start draining on  its own soon which would be ideal. With that being said right now he has been using the Curlex and Coban wrap which she does tolerate okay. He is going for an arteriogram on 14 July. After that if we need to open this we should be able to presuming they get his blood flow going appropriately. 09-02-2021 upon evaluation today patient appears to be doing about the same in regard to his wound this is starting to drain a lot more so it is definitely opening into something a little bit deeper. With that being said I do see signs of improvement to some degree and that there is not as much fluid buildup down in the base of the wound. I am very pleased with that. He is supposed to be having his vascular intervention on Monday after that hopefully will have better blood flow we will try to get this open to be getting draining completely and hopefully get this to resolve. He is not really wearing the compression right now because of discomfort that may again also be partially related to the fact that leg does not have sufficient blood flow to be able and heal this appropriately. Again once we get good blood flow hopefully the pain will not be as much we will get better compression on him as well. Electronic Signature(s) Signed: 09/02/2021 2:00:01 PM By: Worthy Keeler PA-C Entered By: Worthy Keeler on 09/02/2021 14:00:01 -------------------------------------------------------------------------------- Physical Exam Details Patient Name: Date of Service: Russell Collier 09/02/2021 10:15 A M Medical Record Number: [id number] Patient Account Number: [id number] Date of Birth/Sex: Treating RN: 1960/03/22 (61 y.o. [name] Primary Care Provider: Alen Bleacher Other Clinician: Referring Provider: Treating Provider/Extender: Clyda Hurdle in Treatment: 3 Constitutional Well-nourished and well-hydrated in no acute distress. Respiratory normal breathing without  difficulty. Psychiatric this patient is able to make decisions and demonstrates good insight into disease process. Alert and Oriented x 3. pleasant and cooperative. Notes Upon inspection patient's wound again is showing signs of drainage I am still not going to perform any type of debridement until he has good blood flow and I discussed that with him again today. Once he does next week then we should be able to try and debride this to clearway some of the necrotic debris and open this up so it can drain more appropriately and hopefully he will heal faster. Electronic Signature(s) Signed: 09/02/2021 2:00:29 PM By: Worthy Keeler PA-C Entered By: Worthy Keeler on 09/02/2021 14:00:29 -------------------------------------------------------------------------------- Physician Orders Details Patient Name: Date of Service: Russell Collier, Russell Collier 09/02/2021 10:15 A M Medical Record Number: [id number] Patient Account Number: [id number] Date of Birth/Sex: Treating RN: [birth year] (61 y.o. [name] Primary Care Provider: Alen Bleacher Other Clinician: Referring Provider: Treating Provider/Extender: Clyda Hurdle in Treatment: 3 Verbal / Phone Orders: No Diagnosis Coding ICD-10 Coding Code Description I73.89 Other specified peripheral vascular diseases [phone] Non-pressure chronic ulcer of other part of left lower leg with fat layer exposed I87.333 Chronic venous hypertension (idiopathic) with ulcer and inflammation of bilateral lower extremity E11.622 Type 2 diabetes mellitus with other  skin ulcer I25.10 Atherosclerotic heart disease of native coronary artery without angina pectoris Follow-up Appointments ppointment in 1 week. - 09/09/21 @ 1:00pm with Toney Rakes, RN (Room 9) Return A Other: - -Do not use Ibuprofen while on Eliquis Bathing/ Shower/ Hygiene May shower and wash wound with soap and water. - Use antibacterial soap, do not use peroxide or alcohol Edema  Control - Lymphedema / SCD / Other Elevate legs to the level of the heart or above for 30 minutes daily and/or when sitting, a frequency of: - throughout the day Avoid standing for long periods of time. Patient to wear own compression stockings every day. - Use 15-37mHg Compression stocking for right leg. Additional Orders / Instructions Stop/Decrease Smoking Follow Nutritious Diet Wound Treatment Wound #2 - Lower Leg Wound Laterality: Left, Anterior Cleanser: Soap and Water 1 x Per Day/30 Days Discharge Instructions: May shower and wash wound with dial antibacterial soap and water prior to dressing change. Cleanser: Wound Cleanser 1 x Per Day/30 Days Discharge Instructions: Cleanse the wound with wound cleanser prior to applying a clean dressing using gauze sponges, not tissue or cotton balls. Peri-Wound Care: Triamcinolone 15 (g) 1 x Per Day/30 Days Discharge Instructions: Use triamcinolone 15 (g) as directed Peri-Wound Care: Sween Lotion (Moisturizing lotion) 1 x Per Day/30 Days Discharge Instructions: Apply moisturizing lotion as directed Topical: Triamcinolone 1 x Per Day/30 Days Discharge Instructions: Apply Triamcinolone as directed Prim Dressing: KerraCel Ag Gelling Fiber Dressing, 2x2 in (silver alginate) (DME) (Generic) 1 x Per Day/30 Days ary Discharge Instructions: Apply silver alginate to wound bed as instructed Secondary Dressing: ABD Pad, 8x10 (DME) (Generic) 1 x Per Day/30 Days Discharge Instructions: Apply over primary dressing as directed. Secured With: [employer] 4.5x3.1 (in/yd) (DME) (Generic) 1 x Per Day/30 Days Discharge Instructions: Secure with Kerlix as directed. Secured With: 62M Medipore SPublic affairs consultantSurgical T 2x10 (in/yd) (DME) (Generic) 1 x Per Day/30 Days ape Discharge Instructions: Secure with tape as directed. Compression Wrap: Tubigrip 1 x Per Day/30 Days Electronic Signature(s) Signed: 09/02/2021 4:33:23 PM By: SWorthy KeelerPA-C Signed:  09/02/2021 5:05:06 PM By: BLorrin JacksonEntered By: BLorrin Jacksonon 09/02/2021 11:33:48 -------------------------------------------------------------------------------- Problem List Details Patient Name: Date of Service: TJanell Collier Russell Collier 09/02/2021 10:15 A M Medical Record Number: [id number] Account Number: [id number] of Birth/Sex: Treating RN: 3June 06, 1962(61y.o. 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/21/2023 No Yes I87.333 Chronic venous hypertension (idiopathic) with ulcer and inflammation of 08/12/2021 No Yes bilateral lower extremity E11.622 Type 2 diabetes mellitus with other skin ulcer 08/12/2021 No Yes I25.10 Atherosclerotic heart disease of native coronary artery without angina pectoris 08/12/2021 No Yes Inactive Problems Resolved Problems Electronic Signature(s) Signed: 09/02/2021 9:25:25 AM By: SWorthy KeelerPA-C Entered By: SWorthy Keeleron 09/02/2021 09:25:25 -------------------------------------------------------------------------------- Progress Note Details Patient Name: Date of Service: Russell YDarcus Austin RDel[place]BERT 09/02/2021 10:15 A M Medical Record Number: [id number] Account Number: [id number] of Birth/Sex: Treating RN: 62May 09, 1962(61y.o. MMarcheta GrammesPrimary Care Provider: NAlen BleacherOther Clinician: Referring Provider: Treating Provider/Extender: SClyda Hurdlein Treatment: 3 Subjective Chief Complaint Information obtained from Patient Left anterior LE Ulcer History  of Present Illness (HPI) ADMISSION 09/14/2018 This is a 61 year old man who  traumatized his left anterior tibia area on the bathtub. He describes this initially as a bruise that subsequently reopened. He has been seen in the ER several times and has had at least 3 courses of antibiotics including doxy and Keflex twice. He has been using Neosporin and peroxide on this. Past medical history includes obstructive sleep apnea, cellulitis, hypertension, coronary artery disease, peripheral neuropathy [nondiabetic], currently on a heart murmur for atypical chest pain and bradycardia. 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 ARTERIAL STUDIES done in March 2020 showed an ABI on the left of 0.93, TBI on the left slightly reduced at 0.42. On the right his ABI was 0.95 with a TBI of 0.51 he has monophasic waveforms in the lower extremities bilaterally. Interpreted on the left this showing mild left lower extremity arterial disease as well as on the right. 09/25/18-Patient returns at 1 week to the clinic, noted that arterial studies done in March 2020. We have been using Iodoflex to the wound on the left leg, patient has significant degree of pain and declines to have any debridement 8/21; the patient has not been here in 3 weeks. Since he was last here he was admitted to hospital on 8/4. He was felt to have stable angina. He has since been to his cardiologist. I note that he has an EF of about 48%. He has a venous wound on the left anterior mid tibia area probably with lymphedema. We have been using Iodoflex. He does not tolerate debridement well. He tells me that his cardiologist has been adjusting his Lasix and then the wraps fall down he tries to pull them up. He is describing pain making it difficult for him to function. 9/3; patient is was hospitalized since he was last here with COPD exacerbation. Came in with Aquacel Ag on the wound. The wound actually looks improved. 9/17; patient was apparently in the ER again with breathing issues and blood pressure issues. He was not admitted.  We are using Sorbact of the wound with nice improvement dimensions are better 10/1; the patient's wound is smaller and appears to have a reasonable healthy surface. We are using Sorbact 10/8; the wound is smaller although there is still an area with depth and necrotic debris at the base. We are using Sorbact 10/15; not much change in the wound area. We have been using Sorbact. 10/22; small wound area using endoform since last week 11/5; Russell Collier arrived today in a healed state. Left leg and wrapped. He still does not have compression stockings Readmission: 08-12-2021 upon evaluation today patient presents for evaluation here in the clinic he has been seen last in 2020 at that point Dr. Dellia Nims was the treating physician. Subsequently the patient tells me that he does have a history of peripheral vascular disease, chronic venous insufficiency, diabetes mellitus type 2, and coronary artery disease. Unfortunately he has had some issues with his right leg he recently has been seen by [facility] and they did perform an arteriogram on the right leg I believe even putting a stent in. With that being said that leg seems to be doing well he mainly just needs a compression sock. In regard to the left leg which is the one in question he tells me the same day he had a surgery he hit the left leg on a piece of furniture on the anterior shin. This caused a area that seems to be  a hematoma present at this point. With that being said the patient is having significant discomfort. He tells me. He has been taking ibuprofen which he should not be doing I discussed that with him today. Tylenol is really only should be taken being on the Eliquis. He also tells me that he has not really been put anything on other than a Band-Aid I really think he may benefit from some light compression I do not want to do anything too tight until we get a good review of his arterial flow as his ABIs are not good we got  here in the office today was 0.75 on the left 0.65 on the right and to be honest he did not have palpable pulses that I could find. He does not appear to have had an arterial study since 2020 as noted above by Dr. Dellia Nims. 08-19-2021 upon evaluation today patient appears to be doing okay in regard to his wound although he does appear to have some fluid buildup underneath this area. Again I am a little reluctant to try to open this up right now due to the fact that the patient does not appear to have good blood flow based on our initial inspection during the clinic evaluation today. Again he does see his vascular surgeon today. We did not have any palpable pulses that we could find last time I saw him and overall I think that he probably just needs to be seen in order to evaluate and see where things stand. 08-26-2021 upon evaluation today patient appears to be doing well currently in regard to his wound. Has been tolerating the dressing changes without complication. Fortunately we do not have a big open area but he does have some fluid underneath in the center this looks like it might start draining on its own soon which would be ideal. With that being said right now he has been using the Curlex and Coban wrap which she does tolerate okay. He is going for an arteriogram on 14 July. After that if we need to open this we should be able to presuming they get his blood flow going appropriately. 09-02-2021 upon evaluation today patient appears to be doing about the same in regard to his wound this is starting to drain a lot more so it is definitely opening into something a little bit deeper. With that being said I do see signs of improvement to some degree and that there is not as much fluid buildup down in the base of the wound. I am very pleased with that. He is supposed to be having his vascular intervention on Monday after that hopefully will have better blood flow we will try to get this open to be getting  draining completely and hopefully get this to resolve. He is not really wearing the compression right now because of discomfort that may again also be partially related to the fact that leg does not have sufficient blood flow to be able and heal this appropriately. Again once we get good blood flow hopefully the pain will not be as much we will get better compression on him as well. Objective Constitutional Well-nourished and well-hydrated in no acute distress. Vitals Time Taken: 10:25 AM, Height: 70 in, Weight: 315 lbs, BMI: 45.2, Temperature: 98.7 F, Pulse: 71 bpm, Respiratory Rate: 18 breaths/min, Blood Pressure: 120/69 mmHg. Respiratory normal breathing without difficulty. Psychiatric this patient is able to make decisions and demonstrates good insight into disease process. Alert and Oriented x 3. pleasant and  cooperative. General Notes: Upon inspection patient's wound again is showing signs of drainage I am still not going to perform any type of debridement until he has good blood flow and I discussed that with him again today. Once he does next week then we should be able to try and debride this to clearway some of the necrotic debris and open this up so it can drain more appropriately and hopefully he will heal faster. Integumentary (Hair, Skin) Wound #2 status is Open. Original cause of wound was Trauma. The date acquired was: 08/03/2021. The wound has been in treatment 3 weeks. The wound is located on the Left,Anterior Lower Leg. The wound measures 3cm length x 1.4cm width x 0.2cm depth; 3.299cm^2 area and 0.66cm^3 volume. There is Fat Layer (Subcutaneous Tissue) exposed. There is no tunneling or undermining noted. There is a large amount of serous drainage noted. The wound margin is distinct with the outline attached to the wound base. There is large (67-100%) red granulation within the wound bed. There is a small (1-33%) amount of necrotic tissue within the wound  bed. Assessment Active Problems ICD-10 Other specified peripheral vascular diseases Non-pressure chronic ulcer of other part of left lower leg with fat layer exposed Chronic venous hypertension (idiopathic) with ulcer and inflammation of bilateral lower extremity Type 2 diabetes mellitus with other skin ulcer Atherosclerotic heart disease of native coronary artery without angina pectoris Procedures Wound #2 Pre-procedure diagnosis of Wound #2 is an Arterial Insufficiency Ulcer located on the Left,Anterior Lower Leg .Severity of Tissue Pre Debridement is: Fat layer exposed. There was a Chemical/Enzymatic/Mechanical debridement performed by Worthy Keeler, PA. after achieving pain control using Lidocaine 4% Topical Solution. Other agent used was wound cleaner, gauze. A time out was conducted at 11:14, prior to the start of the procedure. A Minimum amount of bleeding was controlled with Pressure. The procedure was tolerated well. Post Debridement Measurements: 3cm length x 1.4cm width x 0.2cm depth; 0.66cm^3 volume. Character of Wound/Ulcer Post Debridement is stable. Severity of Tissue Post Debridement is: Fat layer exposed. Post procedure Diagnosis Wound #2: Same as Pre-Procedure Plan Follow-up Appointments: Return Appointment in 1 week. - 09/09/21 @ 1:00pm with Toney Rakes, RN (Room 9) Other: - -Do not use Ibuprofen while on Eliquis Bathing/ Shower/ Hygiene: May shower and wash wound with soap and water. - Use antibacterial soap, do not use peroxide or alcohol Edema Control - Lymphedema / SCD / Other: Elevate legs to the level of the heart or above for 30 minutes daily and/or when sitting, a frequency of: - throughout the day Avoid standing for long periods of time. Patient to wear own compression stockings every day. - Use 15-49mHg Compression stocking for right leg. Additional Orders / Instructions: Stop/Decrease Smoking Follow Nutritious Diet WOUND #2: - Lower Leg Wound  Laterality: Left, Anterior Cleanser: Soap and Water 1 x Per Day/30 Days Discharge Instructions: May shower and wash wound with dial antibacterial soap and water prior to dressing change. Cleanser: Wound Cleanser 1 x Per Day/30 Days Discharge Instructions: Cleanse the wound with wound cleanser prior to applying a clean dressing using gauze sponges, not tissue or cotton balls. Peri-Wound Care: Triamcinolone 15 (g) 1 x Per Day/30 Days Discharge Instructions: Use triamcinolone 15 (g) as directed Peri-Wound Care: Sween Lotion (Moisturizing lotion) 1 x Per Day/30 Days Discharge Instructions: Apply moisturizing lotion as directed Topical: Triamcinolone 1 x Per Day/30 Days Discharge Instructions: Apply Triamcinolone as directed Prim Dressing: KerraCel Ag Gelling Fiber Dressing, 2x2 in (silver  alginate) (DME) (Generic) 1 x Per Day/30 Days ary Discharge Instructions: Apply silver alginate to wound bed as instructed Secondary Dressing: ABD Pad, 8x10 (DME) (Generic) 1 x Per Day/30 Days Discharge Instructions: Apply over primary dressing as directed. Secured With: [employer], 4.5x3.1 (in/yd) (DME) (Generic) 1 x Per Day/30 Days Discharge Instructions: Secure with Kerlix as directed. Secured With: 49M Medipore Public affairs consultant Surgical T 2x10 (in/yd) (DME) (Generic) 1 x Per Day/30 Days ape Discharge Instructions: Secure with tape as directed. Com pression Wrap: Tubigrip 1 x Per Day/30 Days 1. I would suggest that we go ahead and continue with the wound care measures as before and the patient is in agreement with plan. This includes the use of the silver alginate dressing which I think is still doing a good job here. 2. Also can recommend ABD pad to be continued. 3. We will use roll gauze to secure in place. 4. We will use Tubigrip single layer over this and in place of the wrap for now since the wraps and bother him he takes them off anyway. We will see how this does over the next week. We will see  patient back for reevaluation in 1 week here in the clinic. If anything worsens or changes patient will contact our office for additional recommendations. Electronic Signature(s) Signed: 09/02/2021 2:01:10 PM By: Worthy Keeler PA-C Entered By: Worthy Keeler on 09/02/2021 14:01:10 -------------------------------------------------------------------------------- SuperBill Details Patient Name: Date of Service: Russell Collier, Russell Collier 09/02/2021 Medical Record Number: [id number] Patient Account Number: [id number] Date of Birth/Sex: Treating RN: [birth year] (61 y.o. [name] Primary Care Provider: Alen Bleacher Other Clinician: Referring Provider: Treating Provider/Extender: Clyda Hurdle in Treatment: 3 Diagnosis Coding ICD-10 Codes Code Description [phone] Other specified peripheral vascular diseases [phone] Non-pressure chronic ulcer of other part of left lower leg with fat layer exposed I87.333 Chronic venous hypertension (idiopathic) with ulcer and inflammation of bilateral lower extremity E11.622 Type 2 diabetes mellitus with other skin ulcer I25.10 Atherosclerotic heart disease of native coronary artery without angina pectoris Facility Procedures CPT4 Code: 29937169 Description: [id number] - DEBRIDE W/O ANES NON SELECT Modifier: Quantity: 1 Physician Procedures : CPT4 Code Description Modifier [id number] - WC PHYS LEVEL 3 - EST PT ICD-10 Diagnosis Description I73.89 Other specified peripheral vascular diseases L97.822 Non-pressure chronic ulcer of other part of left lower leg with fat layer exposed I87.333  Chronic venous hypertension (idiopathic) 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

## 2021-09-02 NOTE — Progress Notes (Signed)
DOUGLASS, DUNSHEE ([id number]) Visit Report for 09/02/2021 Arrival Information Details Patient Name: Date of Service: Russell Collier 09/02/2021 10:15 A M Medical Record Number: [id number] Patient Account Number: [id number] Date of Birth/Sex: Treating RN: Apr 18, 1960 (61 y.o. [name] Primary Care Courage Biglow: Garner Gavel HN Other Clinician: Referring Dacota Ruben: Treating Jaiveer Panas/Extender: Delma Freeze HN Weeks in Treatment: 3 Visit Information History Since Last Visit Added or deleted any medications: No Patient Arrived: Walker Any new allergies or adverse reactions: No Arrival Time: 10:22 Had a fall or experienced change in No Transfer Assistance: None activities of daily living that may affect Patient Identification Verified: Yes risk of falls: Secondary Verification Process Completed: Yes Signs or symptoms of abuse/neglect since last visito No Patient Requires Transmission-Based Precautions: No Hospitalized since last visit: No Patient Has Alerts: Yes Implantable device outside of the clinic excluding No Patient Alerts: Patient on Blood Thinner cellular tissue based products placed in the center since last visit: Has Dressing in Place as Prescribed: Yes Has Compression in Place as Prescribed: Yes Pain Present Now: No Electronic Signature(s) Signed: 09/02/2021 5:05:06 PM By: Lorrin Jackson Entered By: Lorrin Jackson on 09/02/2021 10:25:08 -------------------------------------------------------------------------------- Encounter Discharge Information Details Patient Name: Date of Service: Russell Collier, Russell Collier 09/02/2021 10:15 A M Medical Record Number: [id number] Patient Account Number: [id number] Date of Birth/Sex: Treating RN: 12-14-1960 (61 y.o. [name] Primary Care Jaivon Vanbeek: Garner Gavel HN Other Clinician: Referring Aubryana Vittorio: Treating Maris Bena/Extender: Clearance Coots, JO HN Weeks in Treatment: 3 Encounter Discharge Information  Items Post Procedure Vitals Discharge Condition: Stable Temperature (F): 98.7 Ambulatory Status: Walker Pulse (bpm): 71 Discharge Destination: Home Respiratory Rate (breaths/min): 18 Transportation: Private Auto Blood Pressure (mmHg): 120/69 Schedule Follow-up Appointment: Yes Clinical Summary of Care: Provided on 09/02/2021 Form Type Recipient Paper Patient Patient Electronic Signature(s) Signed: 09/02/2021 12:23:01 PM By: Lorrin Jackson Entered By: Lorrin Jackson on 09/02/2021 12:23:00 -------------------------------------------------------------------------------- Lower Extremity Assessment Details Patient Name: Date of Service: Russell Collier 09/02/2021 10:15 A M Medical Record Number: [id number] Patient Account Number: [id number] Date of Birth/Sex: Treating RN: 12-Oct-1960 (61 y.o. [name] Primary Care Reyana Leisey: Garner Gavel HN Other Clinician: Referring Adarian Bur: Treating Colbi Staubs/Extender: Clearance Coots, JO HN Weeks in Treatment: 3 Edema Assessment Assessed: [Left: Yes] [Right: No] Edema: [Left: Ye] [Right: s] Calf Left: Right: Point of Measurement: 37 cm From Medial Instep 42.8 cm Ankle Left: Right: Point of Measurement: 10 cm From Medial Instep 25 cm Vascular Assessment Pulses: Dorsalis Pedis Palpable: [Left:Yes] Electronic Signature(s) Signed: 09/02/2021 5:05:06 PM By: Lorrin Jackson Entered By: Lorrin Jackson on 09/02/2021 10:33:40 -------------------------------------------------------------------------------- Multi-Disciplinary Care Plan Details Patient Name: Date of Service: Russell Collier, Russell Collier 09/02/2021 10:15 A M Medical Record Number: [id number] Patient Account Number: [id number] Date of Birth/Sex: Treating RN: 08/03/1960 (61 y.o. [name] Primary Care Denvil Canning: Garner Gavel HN Other Clinician: Referring Tabetha Haraway: Treating Latarsha Zani/Extender: Clearance Coots, JO HN Weeks in Treatment: 3 Active Inactive Tissue  Oxygenation Nursing Diagnoses: Actual ineffective tissue perfusion; peripheral (select once diagnosis is confirmed) Goals: Patient/caregiver will verbalize understanding of disease process and disease management Date Initiated: 08/12/2021 Target Resolution Date: 09/09/2021 Goal Status: Active Interventions: Assess peripheral arterial status upon admission and as needed Provide education on tissue oxygenation and ischemia Notes: Wound/Skin Impairment Nursing Diagnoses: Impaired tissue integrity Goals: Patient/caregiver will verbalize understanding of skin care regimen Date Initiated: 08/12/2021 Target Resolution Date: 09/09/2021 Goal Status: Active  Ulcer/skin breakdown will have a volume reduction of 30% by week 4 Date Initiated: 08/12/2021 Target Resolution Date: 09/09/2021 Goal Status: Active Interventions: Assess patient/caregiver ability to obtain necessary supplies Assess patient/caregiver ability to perform ulcer/skin care regimen upon admission and as needed Assess ulceration(s) every visit Provide education on smoking Provide education on ulcer and skin care Treatment Activities: Smoking cessation education : 08/12/2021 Topical wound management initiated : 08/12/2021 Notes: Electronic Signature(s) Signed: 09/02/2021 5:05:06 PM By: Lorrin Jackson Entered By: Lorrin Jackson on 09/02/2021 10:33:53 -------------------------------------------------------------------------------- Pain Assessment Details Patient Name: Date of Service: Russell Collier, Russell Collier 09/02/2021 10:15 A M Medical Record Number: [id number] Patient Account Number: [id number] Date of Birth/Sex: Treating RN: 1960-10-30 (61 y.o. [name] Primary Care Jayleah Garbers: Garner Gavel HN Other Clinician: Referring Tip Atienza: Treating Olyn Landstrom/Extender: Clearance Coots, Chimayo HN Weeks in Treatment: 3 Active Problems Location of Pain Severity and Description of Pain Patient Has Paino No Site Locations Pain  Management and Medication Current Pain Management: Electronic Signature(s) Signed: 09/02/2021 5:05:06 PM By: Lorrin Jackson Entered By: Lorrin Jackson on 09/02/2021 10:27:41 -------------------------------------------------------------------------------- Patient/Caregiver Education Details Patient Name: Date of Service: Russell Collier, Russell Collier 7/12/2023andnbsp10:15 [place] Record Number: [id number] Patient Account Number: [id number] Date of Birth/Gender: Treating RN: 09-03-1960 (61 y.o. [name] Primary Care Physician: Garner Gavel HN Other Clinician: Referring Physician: Treating Physician/Extender: Delma Freeze HN Weeks in Treatment: 3 Education Assessment Education Provided To: Patient Education Topics Provided Smoking and Wound Healing: Methods: Explain/Verbal, Printed Responses: State content correctly Tissue Oxygenation: Methods: Explain/Verbal Responses: State content correctly Venous: Methods: Explain/Verbal, Printed Responses: Reinforcements needed Wound/Skin Impairment: Methods: Explain/Verbal, Printed Responses: State content correctly Notes Patient has been removing compression wraps Electronic Signature(s) Signed: 09/02/2021 5:05:06 PM By: Lorrin Jackson Entered By: Lorrin Jackson on 09/02/2021 10:34:41 -------------------------------------------------------------------------------- Wound Assessment Details Patient Name: Date of Service: Russell Collier, Russell Collier 09/02/2021 10:15 A M Medical Record Number: [id number] Patient Account Number: [id number] Date of Birth/Sex: Treating RN: 03-07-1960 (61 y.o. [name] Primary Care Clayten Allcock: Garner Gavel HN Other Clinician: Referring Carlitos Bottino: Treating Alakai Macbride/Extender: Clearance Coots, JO HN Weeks in Treatment: 3 Wound Status Wound Number: 2 Primary Arterial Insufficiency Ulcer Etiology: Wound Location: Left, Anterior Lower Leg Wound Open Wounding Event:  Trauma Status: Date Acquired: 08/03/2021 Date Acquired: 08/03/2021 Comorbid Hemophilia, Asthma, Sleep Apnea, Angina, Congestive Heart Weeks Of Treatment: 3 History: Failure, Coronary Artery Disease, Hypertension, Peripheral Arterial Clustered Wound: No Disease, Type II Diabetes, Osteoarthritis, Neuropathy Photos Wound Measurements Length: (cm) 3 Width: (cm) 1.4 Depth: (cm) 0.2 Area: (cm) 3.299 Volume: (cm) 0.66 % Reduction in Area: -86.7% % Reduction in Volume: -87% Epithelialization: Large (67-100%) Tunneling: No Undermining: No Wound Description Classification: Full Thickness Without Exposed Support Structures Wound Margin: Distinct, outline attached Exudate Amount: Large Exudate Type: Serous Exudate Color: amber Foul Odor After Cleansing: No Slough/Fibrino Yes Wound Bed Granulation Amount: Large (67-100%) Exposed Structure Granulation Quality: Red Fascia Exposed: No Necrotic Amount: Small (1-33%) Fat Layer (Subcutaneous Tissue) Exposed: Yes Tendon Exposed: No Muscle Exposed: No Joint Exposed: No Bone Exposed: No Treatment Notes Wound #2 (Lower Leg) Wound Laterality: Left, Anterior Cleanser Soap and Water Discharge Instruction: May shower and wash wound with dial antibacterial soap and water prior to dressing change. Wound Cleanser Discharge Instruction: Cleanse the wound with wound cleanser prior to applying a clean dressing using gauze sponges, not tissue or cotton balls. Peri-Wound Care Triamcinolone 15 (g) Discharge Instruction: Use triamcinolone  15 (g) as directed Sween Lotion (Moisturizing lotion) Discharge Instruction: Apply moisturizing lotion as directed Topical Triamcinolone Discharge Instruction: Apply Triamcinolone as directed Primary Dressing KerraCel Ag Gelling Fiber Dressing, 2x2 in (silver alginate) Discharge Instruction: Apply silver alginate to wound bed as instructed Secondary Dressing ABD Pad, 8x10 Discharge Instruction: Apply over  primary dressing as directed. Secured With [employer], 4.5x3.1 (in/yd) Discharge Instruction: Secure with Kerlix as directed. 32M Medipore Soft Cloth Surgical T 2x10 (in/yd) ape Discharge Instruction: Secure with tape as directed. Compression Wrap Tubigrip Compression Stockings Add-Ons Electronic Signature(s) Signed: 09/02/2021 5:05:06 PM By: Lorrin Jackson Entered By: Lorrin Jackson on 09/02/2021 11:31:52 -------------------------------------------------------------------------------- Vitals Details Patient Name: Date of Service: Russell Collier, Russell Collier 09/02/2021 10:15 A M Medical Record Number: [id number] Patient Account Number: [id number] Date of Birth/Sex: Treating RN: [birth year] (61 y.o. [name] Primary Care Walburga Hudman: Garner Gavel HN Other Clinician: Referring Sharolyn Weber: Treating Elizjah Noblet/Extender: Clearance Coots, JO HN Weeks in Treatment: 3 Vital Signs Time Taken: 10:25 Temperature (F): 98.7 Height (in): 70 Pulse (bpm): 71 Weight (lbs): 315 Respiratory Rate (breaths/min): 18 Body Mass Index (BMI): 45.2 Blood Pressure (mmHg): 120/69 Reference Range: 80 - 120 mg / dl Electronic Signature(s) Signed: 09/02/2021 5:05:06 PM By: Lorrin Jackson Entered By: Lorrin Jackson on 09/02/2021 10:27:32

## 2021-09-03 ENCOUNTER — Other Ambulatory Visit: Payer: Self-pay | Admitting: Student

## 2021-09-03 ENCOUNTER — Telehealth (HOSPITAL_COMMUNITY): Payer: Self-pay

## 2021-09-03 ENCOUNTER — Other Ambulatory Visit (HOSPITAL_COMMUNITY): Payer: Self-pay

## 2021-09-03 DIAGNOSIS — M5136 Other intervertebral disc degeneration, lumbar region: Secondary | ICD-10-CM | POA: Diagnosis not present

## 2021-09-03 DIAGNOSIS — I87333 Chronic venous hypertension (idiopathic) with ulcer and inflammation of bilateral lower extremity: Secondary | ICD-10-CM | POA: Diagnosis not present

## 2021-09-03 DIAGNOSIS — E11622 Type 2 diabetes mellitus with other skin ulcer: Secondary | ICD-10-CM | POA: Diagnosis not present

## 2021-09-03 DIAGNOSIS — L97822 Non-pressure chronic ulcer of other part of left lower leg with fat layer exposed: Secondary | ICD-10-CM | POA: Diagnosis not present

## 2021-09-03 MED ORDER — VALACYCLOVIR HCL 500 MG PO TABS
500.0000 mg | ORAL_TABLET | Freq: Every day | ORAL | 3 refills | Status: DC
Start: 2021-09-03 — End: 2022-01-18

## 2021-09-03 MED ORDER — PANTOPRAZOLE SODIUM 40 MG PO TBEC
40.0000 mg | DELAYED_RELEASE_TABLET | Freq: Every day | ORAL | 0 refills | Status: DC
Start: 2021-09-03 — End: 2021-12-09

## 2021-09-03 MED ORDER — PREGABALIN 100 MG PO CAPS: 100.0000 mg | ORAL_CAPSULE | Freq: Three times a day (TID) | ORAL | 0 refills | Status: DC

## 2021-09-03 NOTE — Progress Notes (Signed)
Refilled Pregabalin, Pantoprazole  and Valtrex

## 2021-09-03 NOTE — Telephone Encounter (Signed)
Received a call from a previous paramedicine patient requesting assistance with obtaining refills from his PCP. He reports his PCP Cresenzo is no longer active at his PCP office and the Pharmacy does not know who to send refill requests to. I advised Mr. Kimball to reach out to their office to see who his newly assigned provider is and to have them send refill requests to them. He said he did that and he had not gotten a returned call. I advised him with his permission I could message his provider listed in his chart to do so. He agreed and said he needs refills of the following sent to [facility].  Pregabalin Pantoprazole  Valtrex    I will send HF clinic triage a message in regards to his HF meds.   Call complete.   Salena Saner, Bernie 09/03/2021

## 2021-09-04 ENCOUNTER — Other Ambulatory Visit: Payer: Self-pay | Admitting: *Deleted

## 2021-09-04 ENCOUNTER — Other Ambulatory Visit (HOSPITAL_COMMUNITY): Payer: Self-pay | Admitting: *Deleted

## 2021-09-04 DIAGNOSIS — M5136 Other intervertebral disc degeneration, lumbar region: Secondary | ICD-10-CM | POA: Diagnosis not present

## 2021-09-04 MED ORDER — ISOSORBIDE MONONITRATE ER 30 MG PO TB24: 30.0000 mg | ORAL_TABLET | Freq: Every day | ORAL | 3 refills | Status: DC

## 2021-09-07 ENCOUNTER — Other Ambulatory Visit: Payer: Self-pay

## 2021-09-07 ENCOUNTER — Ambulatory Visit (HOSPITAL_COMMUNITY)
Admission: RE | Admit: 2021-09-07 | Discharge: 2021-09-07 | Disposition: A | Discharge status: Home / Self Care | Payer: Medicaid Other | Attending: Vascular Surgery | Admitting: Vascular Surgery

## 2021-09-07 ENCOUNTER — Encounter (HOSPITAL_COMMUNITY)
Admission: RE | Disposition: A | Discharge status: Home / Self Care | Payer: Self-pay | Source: Home / Self Care | Attending: Vascular Surgery

## 2021-09-07 DIAGNOSIS — L97829 Non-pressure chronic ulcer of other part of left lower leg with unspecified severity: Secondary | ICD-10-CM | POA: Diagnosis not present

## 2021-09-07 DIAGNOSIS — M5136 Other intervertebral disc degeneration, lumbar region: Secondary | ICD-10-CM | POA: Diagnosis not present

## 2021-09-07 DIAGNOSIS — I70243 Atherosclerosis of native arteries of left leg with ulceration of ankle: Secondary | ICD-10-CM | POA: Diagnosis not present

## 2021-09-07 DIAGNOSIS — I70248 Atherosclerosis of native arteries of left leg with ulceration of other part of lower left leg: Secondary | ICD-10-CM | POA: Diagnosis present

## 2021-09-07 DIAGNOSIS — I70222 Atherosclerosis of native arteries of extremities with rest pain, left leg: Secondary | ICD-10-CM

## 2021-09-07 DIAGNOSIS — Z9981 Dependence on supplemental oxygen: Secondary | ICD-10-CM | POA: Diagnosis not present

## 2021-09-07 HISTORY — PX: PERIPHERAL VASCULAR BALLOON ANGIOPLASTY: CATH118281

## 2021-09-07 HISTORY — PX: ABDOMINAL AORTOGRAM W/LOWER EXTREMITY: CATH118223

## 2021-09-07 LAB — POCT I-STAT, CHEM 8
BUN: 27 mg/dL — ABNORMAL HIGH (ref 8–23)
Calcium, Ion: 1.27 mmol/L (ref 1.15–1.40)
Chloride: 107 mmol/L (ref 98–111)
Creatinine, Ser: 1.4 mg/dL — ABNORMAL HIGH (ref 0.61–1.24)
Glucose, Bld: 117 mg/dL — ABNORMAL HIGH (ref 70–99)
HCT: 38 % — ABNORMAL LOW (ref 39.0–52.0)
Hemoglobin: 12.9 g/dL — ABNORMAL LOW (ref 13.0–17.0)
Potassium: 4.5 mmol/L (ref 3.5–5.1)
Sodium: 141 mmol/L (ref 135–145)
TCO2: 24 mmol/L (ref 22–32)

## 2021-09-07 LAB — POCT ACTIVATED CLOTTING TIME: Activated Clotting Time: 269 seconds

## 2021-09-07 SURGERY — ABDOMINAL AORTOGRAM W/LOWER EXTREMITY
Anesthesia: LOCAL

## 2021-09-07 MED ORDER — CLOPIDOGREL BISULFATE 75 MG PO TABS: 75.0000 mg | ORAL_TABLET | Freq: Every day | ORAL | 11 refills | Status: DC

## 2021-09-07 MED ORDER — CLOPIDOGREL BISULFATE 75 MG PO TABS
300.0000 mg | ORAL_TABLET | Freq: Once | ORAL | Status: DC
  Filled 2021-09-07: qty 4

## 2021-09-07 MED ORDER — MIDAZOLAM HCL 2 MG/2ML IJ SOLN
INTRAMUSCULAR | Status: AC
  Filled 2021-09-07: qty 2

## 2021-09-07 MED ORDER — MIDAZOLAM HCL 2 MG/2ML IJ SOLN
INTRAMUSCULAR | Status: DC | PRN
  Administered 2021-09-07: 1 mg via INTRAVENOUS

## 2021-09-07 MED ORDER — LIDOCAINE HCL (PF) 1 % IJ SOLN
INTRAMUSCULAR | Status: AC
  Filled 2021-09-07: qty 30

## 2021-09-07 MED ORDER — IODIXANOL 320 MG/ML IV SOLN
INTRAVENOUS | Status: DC | PRN
  Administered 2021-09-07: 50 mL via INTRA_ARTERIAL

## 2021-09-07 MED ORDER — ACETAMINOPHEN 325 MG PO TABS
650.0000 mg | ORAL_TABLET | ORAL | Status: DC | PRN
Start: 2021-09-07 — End: 2021-09-07

## 2021-09-07 MED ORDER — ROSUVASTATIN CALCIUM 10 MG PO TABS: 10.0000 mg | ORAL_TABLET | Freq: Every day | ORAL | Status: DC

## 2021-09-07 MED ORDER — ONDANSETRON HCL 4 MG/2ML IJ SOLN: 4.0000 mg | Freq: Four times a day (QID) | INTRAMUSCULAR | Status: DC | PRN

## 2021-09-07 MED ORDER — HYDRALAZINE HCL 20 MG/ML IJ SOLN: 5.0000 mg | INTRAMUSCULAR | Status: DC | PRN

## 2021-09-07 MED ORDER — FENTANYL CITRATE (PF) 100 MCG/2ML IJ SOLN
INTRAMUSCULAR | Status: DC | PRN
  Administered 2021-09-07: 50 ug via INTRAVENOUS
  Administered 2021-09-07 (×2): 25 ug via INTRAVENOUS

## 2021-09-07 MED ORDER — SODIUM CHLORIDE 0.9 % WEIGHT BASED INFUSION: 1.0000 mL/kg/h | INTRAVENOUS | Status: DC

## 2021-09-07 MED ORDER — CLOPIDOGREL BISULFATE 300 MG PO TABS
ORAL_TABLET | ORAL | Status: DC | PRN
  Administered 2021-09-07: 300 mg via ORAL

## 2021-09-07 MED ORDER — SODIUM CHLORIDE 0.9 % IV SOLN: 250.0000 mL | INTRAVENOUS | Status: DC | PRN

## 2021-09-07 MED ORDER — CLOPIDOGREL BISULFATE 300 MG PO TABS
ORAL_TABLET | ORAL | Status: AC
  Filled 2021-09-07: qty 1

## 2021-09-07 MED ORDER — SODIUM CHLORIDE 0.9% FLUSH: 3.0000 mL | INTRAVENOUS | Status: DC | PRN

## 2021-09-07 MED ORDER — ROSUVASTATIN CALCIUM 10 MG PO TABS: 10.0000 mg | ORAL_TABLET | Freq: Every day | ORAL | 11 refills | Status: DC

## 2021-09-07 MED ORDER — FENTANYL CITRATE (PF) 100 MCG/2ML IJ SOLN
INTRAMUSCULAR | Status: AC
  Filled 2021-09-07: qty 2

## 2021-09-07 MED ORDER — SODIUM CHLORIDE 0.9 % IV SOLN: INTRAVENOUS | Status: DC

## 2021-09-07 MED ORDER — LABETALOL HCL 5 MG/ML IV SOLN: 10.0000 mg | INTRAVENOUS | Status: DC | PRN

## 2021-09-07 MED ORDER — LIDOCAINE HCL (PF) 1 % IJ SOLN
INTRAMUSCULAR | Status: DC | PRN
  Administered 2021-09-07: 20 mL via INTRADERMAL

## 2021-09-07 MED ORDER — CLOPIDOGREL BISULFATE 75 MG PO TABS: 75.0000 mg | ORAL_TABLET | Freq: Every day | ORAL | Status: DC

## 2021-09-07 MED ORDER — HEPARIN (PORCINE) IN NACL 1000-0.9 UT/500ML-% IV SOLN
INTRAVENOUS | Status: AC
  Filled 2021-09-07: qty 1000

## 2021-09-07 MED ORDER — SODIUM CHLORIDE 0.9% FLUSH: 3.0000 mL | Freq: Two times a day (BID) | INTRAVENOUS | Status: DC

## 2021-09-07 MED ORDER — HEPARIN (PORCINE) IN NACL 1000-0.9 UT/500ML-% IV SOLN
INTRAVENOUS | Status: DC | PRN
  Administered 2021-09-07 (×2): 500 mL

## 2021-09-07 SURGICAL SUPPLY — 23 items
BALLN STERLING SL OTW 3X80X150 (BALLOONS) ×3
BALLOON STRLNG SL OTW 3X80X150 (BALLOONS) IMPLANT
CATH OMNI FLUSH 5F 65CM (CATHETERS) ×1 IMPLANT
CATH QUICKCROSS .018X135CM (MICROCATHETER) ×1 IMPLANT
CATH SOFT-VU 4F 65 STRAIGHT (CATHETERS) IMPLANT
CATH SOFT-VU STRAIGHT 4F 65CM (CATHETERS) ×1
CATH TEMPO AQUA 5F 100CM (CATHETERS) ×1 IMPLANT
CLOSURE MYNX CONTROL 6F/7F (Vascular Products) ×1 IMPLANT
GUIDEWIRE ANGLED .035X150CM (WIRE) ×1 IMPLANT
KIT ANGIASSIST CO2 SYSTEM (KITS) ×1 IMPLANT
KIT ENCORE 26 ADVANTAGE (KITS) ×1 IMPLANT
KIT MICROPUNCTURE NIT STIFF (SHEATH) ×1 IMPLANT
KIT PV (KITS) ×3 IMPLANT
MAT PREVALON FULL STRYKER (MISCELLANEOUS) ×1 IMPLANT
SHEATH PINNACLE 5F 10CM (SHEATH) ×1 IMPLANT
SHEATH PINNACLE ST 6F 65CM (SHEATH) ×1 IMPLANT
SHEATH PROBE COVER 6X72 (BAG) ×1 IMPLANT
STOPCOCK MORSE 400PSI 3WAY (MISCELLANEOUS) ×1 IMPLANT
TRANSDUCER W/STOPCOCK (MISCELLANEOUS) ×3 IMPLANT
TRAY PV CATH (CUSTOM PROCEDURE TRAY) ×3 IMPLANT
WIRE BENTSON .035X145CM (WIRE) ×1 IMPLANT
WIRE G V18X300CM (WIRE) ×1 IMPLANT
WIRE ROSEN-J .035X260CM (WIRE) ×1 IMPLANT

## 2021-09-07 NOTE — Op Note (Signed)
    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 Donzetta Matters, MD Procedure Performed: 1.  Ultrasound-guided cannulation right common femoral artery 2.  Selection of left common femoral artery left lower extremity angiography with CO2 and contrast 3.  Balloon angioplasty left peroneal artery with 3 x 80 mm balloon 4.  Moderate sedation with fentanyl and Versed for 52 minutes 5.  Mynx device closure right common femoral artery   Indications: 62 year old male recently underwent diagnostic angiography from a left common femoral approach.  He now has a wound on the left leg is indicated for angiography with possible intervention of the left lower extremity  Findings: Aorta was not evaluated this time left lower extremity common femoral SFA profunda and popliteal are all patent.  He has single-vessel runoff via peroneal which has about 80% stenosis proximally after balloon angioplasty this was reduced to 0% there is much improved signal at the ankle and the peroneal artery.  I will be for follow-up in a few weeks and we will get venous reflux testing as well.   Procedure:  The patient was identified in the holding area and taken to room 8.  The patient was then placed supine on the table and prepped and draped in the usual sterile fashion.  A time out was called.  Ultrasound was used to evaluate the right common femoral artery today to be patent and compressible.  The area was anesthetized 1% lidocaine cannulated micropuncture needle followed by wire and sheath.  Images saved to the record.  His vital signs were monitored throughout the case and fentanyl and Versed were administered for a total of 52 minutes.  5 French sheath was placed.  We immediately crossed the bifurcation with Glidewire and used a straight catheter perform left lower  extremity angiography from the common femoral artery with CO2 and then selected with a straight catheter to the left popliteal artery perform angiography below the knee with contrast.  With the above findings we placed a long 6 French sheath patient was given 10,000 units of heparin.  We crossed the lesion with a V18 wire and confirmed intraluminal access perform straight balloon angioplasty with 3 mm balloon which resolved the stenosis.  Satisfied with the above findings we elected to exchanged for short 6 [place] sheath playmates device which deployed well.  He tolerated procedure without any complication.  Contrast: 50 cc   Yannis Broce C. Donzetta Matters, MD [facility] Office: [phone] Pager: [phone]

## 2021-09-07 NOTE — Interval H&P Note (Signed)
History and Physical Interval Note:  09/07/2021 7:15 AM  Russell Collier  has presented today for surgery, with the diagnosis of critical limb ischemia left lower extremity.  The various methods of treatment have been discussed with the patient and family. After consideration of risks, benefits and other options for treatment, the patient has consented to  Procedure(s): ABDOMINAL AORTOGRAM W/LOWER EXTREMITY (N/A) as a surgical intervention.  The patient's history has been reviewed, patient examined, no change in status, stable for surgery.  I have reviewed the patient's chart and labs.  Questions were answered to the patient's satisfaction.     [name]

## 2021-09-08 ENCOUNTER — Telehealth: Payer: Self-pay

## 2021-09-08 ENCOUNTER — Encounter (HOSPITAL_COMMUNITY): Payer: Self-pay | Admitting: Vascular Surgery

## 2021-09-08 DIAGNOSIS — M5136 Other intervertebral disc degeneration, lumbar region: Secondary | ICD-10-CM | POA: Diagnosis not present

## 2021-09-08 NOTE — Telephone Encounter (Addendum)
Pt called to confirm his d/c medications. Staff msg sent to Dr Donzetta Matters.   Reviewed pt's chart, returned pt's call, two identifiers used. Relayed Dr Claretha Cooper msg r/t meds. Confirmed understanding.

## 2021-09-09 ENCOUNTER — Encounter (HOSPITAL_BASED_OUTPATIENT_CLINIC_OR_DEPARTMENT_OTHER): Payer: Medicaid Other | Admitting: Physician Assistant

## 2021-09-09 DIAGNOSIS — I70248 Atherosclerosis of native arteries of left leg with ulceration of other part of lower left leg: Secondary | ICD-10-CM | POA: Diagnosis not present

## 2021-09-09 DIAGNOSIS — E11622 Type 2 diabetes mellitus with other skin ulcer: Secondary | ICD-10-CM | POA: Diagnosis not present

## 2021-09-09 DIAGNOSIS — M5136 Other intervertebral disc degeneration, lumbar region: Secondary | ICD-10-CM | POA: Diagnosis not present

## 2021-09-09 DIAGNOSIS — L97822 Non-pressure chronic ulcer of other part of left lower leg with fat layer exposed: Secondary | ICD-10-CM | POA: Diagnosis not present

## 2021-09-09 NOTE — Progress Notes (Addendum)
Russell Collier ([id number]) Visit Report for 09/09/2021 Chief Complaint Document Details Patient Name: Date of Service: Russell Collier 09/09/2021 1:00 PM Medical Record Number: [id number] Patient Account Number: [id number] Date of Birth/Sex: Treating RN: [birth year] (61 y.o. Erie Noe Primary Care Provider: Garner Gavel HN Other Clinician: Referring Provider: Treating Provider/Extender: Delma Freeze HN Weeks in Treatment: 4 Information Obtained from: Patient Chief Complaint Left anterior LE Ulcer Electronic Signature(s) Signed: 09/09/2021 1:07:22 PM By: Worthy Keeler PA-C Entered By: Worthy Keeler on 09/09/2021 13:07:22 -------------------------------------------------------------------------------- Debridement Details Patient Name: Date of Service: Russell Collier 09/09/2021 1:00 PM Medical Record Number: [id number] Patient Account Number: [id number] Date of Birth/Sex: Treating RN: 22-Oct-1960 (61 y.o. [name], [name] Primary Care Provider: Garner Gavel HN Other Clinician: Referring Provider: Treating Provider/Extender: Delma Freeze HN Weeks in Treatment: 4 Debridement Performed for Assessment: Wound #2 Left,Anterior Lower Leg Performed By: Physician Worthy Keeler, PA Debridement Type: Debridement Severity of Tissue Pre Debridement: Fat layer exposed Level of Consciousness (Pre-procedure): Awake and Alert Pre-procedure Verification/Time Out Yes - 13:40 Taken: Start Time: 13:40 Pain Control: Lidocaine T Area Debrided (L x W): otal 1.9 (cm) x 1.2 (cm) = 2.28 (cm) Tissue and other material debrided: Viable, Non-Viable, Slough, Subcutaneous, Slough Level: Skin/Subcutaneous Tissue Debridement Description: Excisional Instrument: Curette Bleeding: Minimum Hemostasis Achieved: Pressure End Time: 13:40 Procedural Pain: 0 Post Procedural Pain: 0 Response to Treatment: Procedure was tolerated well Level of Consciousness (Post-  Awake and Alert procedure): Post Debridement Measurements of Total Wound Length: (cm) 1.9 Width: (cm) 1.2 Depth: (cm) 0.2 Volume: (cm) 0.358 Character of Wound/Ulcer Post Debridement: Improved Severity of Tissue Post Debridement: Fat layer exposed Post Procedure Diagnosis Same as Pre-procedure Electronic Signature(s) Signed: 09/09/2021 3:34:17 PM By: Rhae Hammock RN Signed: 09/09/2021 3:46:42 PM By: Worthy Keeler PA-C Entered By: Rhae Hammock on 09/09/2021 13:45:24 -------------------------------------------------------------------------------- HPI Details Patient Name: Date of Service: Russell Collier, Russell Collier 09/09/2021 1:00 PM Medical Record Number: [id number] Patient Account Number: [id number] Date of Birth/Sex: Treating RN: 08/16/1960 (61 y.o. Erie Noe Primary Care Provider: Garner Gavel HN Other Clinician: Referring Provider: Treating Provider/Extender: Clearance Coots, JO HN Weeks in Treatment: 4 History of Present Illness HPI Description: ADMISSION 09/14/2018 This is a 61 year old man who traumatized his left anterior tibia area on the bathtub. He describes this initially as a bruise that subsequently reopened. He has been seen in the ER several times and has had at least 3 courses of antibiotics including doxy and Keflex twice. He has been using Neosporin and peroxide on this. Past medical history includes obstructive sleep apnea, cellulitis, hypertension, coronary artery disease, peripheral neuropathy [nondiabetic], currently on a heart murmur for atypical chest pain and bradycardia. 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 ARTERIAL STUDIES done in March 2020 showed an ABI on the left of 0.93, TBI on the left slightly reduced at 0.42. On the right his ABI was 0.95 with a TBI of 0.51 he has monophasic waveforms in the lower extremities bilaterally. Interpreted on the left this showing mild left lower extremity arterial disease as well as on the  right. 09/25/18-Patient returns at 1 week to the clinic, noted that arterial studies done in March 2020. We have been using Iodoflex to the wound on the left leg, patient has significant degree of pain and declines to have any debridement 8/21; the patient has not been here in 3 weeks. Since he was  last here he was admitted to hospital on 8/4. He was felt to have stable angina. He has since been to his cardiologist. I note that he has an EF of about 48%. He has a venous wound on the left anterior mid tibia area probably with lymphedema. We have been using Iodoflex. He does not tolerate debridement well. He tells me that his cardiologist has been adjusting his Lasix and then the wraps fall down he tries to pull them up. He is describing pain making it difficult for him to function. 9/3; patient is was hospitalized since he was last here with COPD exacerbation. Came in with Aquacel Ag on the wound. The wound actually looks improved. 9/17; patient was apparently in the ER again with breathing issues and blood pressure issues. He was not admitted. We are using Sorbact of the wound with nice improvement dimensions are better 10/1; the patient's wound is smaller and appears to have a reasonable healthy surface. We are using Sorbact 10/8; the wound is smaller although there is still an area with depth and necrotic debris at the base. We are using Sorbact 10/15; not much change in the wound area. We have been using Sorbact. 10/22; small wound area using endoform since last week 11/5; [name] arrived today in a healed state. Left leg and wrapped. He still does not have compression stockings Readmission: 08-12-2021 upon evaluation today patient presents for evaluation here in the clinic he has been seen last in 2020 at that point Dr. Dellia Nims was the treating physician. Subsequently the patient tells me that he does have a history of peripheral vascular disease, chronic venous insufficiency, diabetes mellitus  type 2, and coronary artery disease. Unfortunately he has had some issues with his right leg he recently has been [facility] and they did perform an arteriogram on the right leg I believe even putting a stent in. With that being said that leg seems to be doing well he mainly just needs a compression sock. In regard to the left leg which is the one in question he tells me the same day he had a surgery he hit the left leg on a piece of furniture on the anterior shin. This caused a area that seems to be a hematoma present at this point. With that being said the patient is having significant discomfort. He tells me. He has been taking ibuprofen which he should not be doing I discussed that with him today. Tylenol is really only should be taken being on the Eliquis. He also tells me that he has not really been put anything on other than a Band-Aid I really think he may benefit from some light compression I do not want to do anything too tight until we get a good review of his arterial flow as his ABIs are not good we got here in the office today was 0.75 on the left 0.65 on the right and to be honest he did not have palpable pulses that I could find. He does not appear to have had an arterial study since 2020 as noted above by Dr. Dellia Nims. 08-19-2021 upon evaluation today patient appears to be doing okay in regard to his wound although he does appear to have some fluid buildup underneath this area. Again I am a little reluctant to try to open this up right now due to the fact that the patient does not appear to have good blood flow based on our initial inspection during  the clinic evaluation today. Again he does see his vascular surgeon today. We did not have any palpable pulses that we could find last time I saw him and overall I think that he probably just needs to be seen in order to evaluate and see where things stand. 08-26-2021 upon evaluation today patient appears to be doing  well currently in regard to his wound. Has been tolerating the dressing changes without complication. Fortunately we do not have a big open area but he does have some fluid underneath in the center this looks like it might start draining on its own soon which would be ideal. With that being said right now he has been using the Curlex and Coban wrap which she does tolerate okay. He is going for an arteriogram on 14 July. After that if we need to open this we should be able to presuming they get his blood flow going appropriately. 09-02-2021 upon evaluation today patient appears to be doing about the same in regard to his wound this is starting to drain a lot more so it is definitely opening into something a little bit deeper. With that being said I do see signs of improvement to some degree and that there is not as much fluid buildup down in the base of the wound. I am very pleased with that. He is supposed to be having his vascular intervention on Monday after that hopefully will have better blood flow we will try to get this open to be getting draining completely and hopefully get this to resolve. He is not really wearing the compression right now because of discomfort that may again also be partially related to the fact that leg does not have sufficient blood flow to be able and heal this appropriately. Again once we get good blood flow hopefully the pain will not be as much we will get better compression on him as well. 09-09-2021 patient underwent the balloon angioplasty of the left peroneal artery on 09-07-2021. It sounds like that the area of stenosis was resolved based on what I am seeing at this point in time. It looks like the Dr. Gwenlyn Saran was satisfied with the blood flow postprocedure which was awesome. The patient had no issues with complication during or following the procedure. He is actually present for evaluation here in the clinic today and the good news is the wound is already looking much  better and has only been a couple of days. Fortunately he is not having a terrible amount of pain though is still having some discomfort at this time. I definitely think this is something that we can manage quite nicely. Electronic Signature(s) Signed: 09/09/2021 1:26:23 PM By: Worthy Keeler PA-C Entered By: Worthy Keeler on 09/09/2021 13:26:23 -------------------------------------------------------------------------------- Physical Exam Details Patient Name: Date of Service: Russell Collier 09/09/2021 1:00 PM Medical Record Number: [id number] Patient Account Number: [id number] Date of Birth/Sex: Treating RN: [birth year] (61 y.o. Erie Noe Primary Care Provider: Garner Gavel HN Other Clinician: Referring Provider: Treating Provider/Extender: Clearance Coots, JO HN Weeks in Treatment: 4 Constitutional Obese and well-hydrated in no acute distress. Respiratory normal breathing without difficulty. Psychiatric this patient is able to make decisions and demonstrates good insight into disease process. Alert and Oriented x 3. pleasant and cooperative. Notes Patient's wound is actually showing significant signs of improvement already. There are some necrotic tissue that we need to remove I think this is good to help it heal much more quickly.  He has good blood flow now gone from 80% stenosis to 0 following his procedure which is great news. Electronic Signature(s) Signed: 09/09/2021 1:50:59 PM By: Worthy Keeler PA-C Entered By: Worthy Keeler on 09/09/2021 13:50:59 -------------------------------------------------------------------------------- Physician Orders Details Patient Name: Date of Service: Enigma, Russell Collier 09/09/2021 1:00 PM Medical Record Number: [id number] Patient Account Number: [id number] Date of Birth/Sex: Treating RN: 10/31/1960 (61 y.o. [name], [name] Primary Care Provider: Garner Gavel HN Other Clinician: Referring Provider: Treating  Provider/Extender: Delma Freeze HN Weeks in Treatment: 4 Verbal / Phone Orders: No Diagnosis Coding ICD-10 Coding Code Description I73.89 Other specified peripheral vascular diseases L97.822 Non-pressure chronic ulcer of other part of left lower leg with fat layer exposed I87.333 Chronic venous hypertension (idiopathic) with ulcer and inflammation of bilateral lower extremity E11.622 Type 2 diabetes mellitus with other skin ulcer I25.10 Atherosclerotic heart disease of native coronary artery without angina pectoris Follow-up Appointments ppointment in 1 week. - 09/16/21 @ 1515 with Toney Rakes, RN (Room 9) Return A Other: - -Do not use Ibuprofen while on Eliquis Bathing/ Shower/ Hygiene May shower and wash wound with soap and water. - Use antibacterial soap, do not use peroxide or alcohol Edema Control - Lymphedema / SCD / Other Elevate legs to the level of the heart or above for 30 minutes daily and/or when sitting, a frequency of: - throughout the day Avoid standing for long periods of time. Patient to wear own compression stockings every day. - Use 15-21mHg Compression stocking for right leg. Additional Orders / Instructions Stop/Decrease Smoking Follow Nutritious Diet Wound Treatment Wound #2 - Lower Leg Wound Laterality: Left, Anterior Cleanser: Soap and Water 1 x Per Day/30 Days Discharge Instructions: May shower and wash wound with dial antibacterial soap and water prior to dressing change. Cleanser: Wound Cleanser 1 x Per Day/30 Days Discharge Instructions: Cleanse the wound with wound cleanser prior to applying a clean dressing using gauze sponges, not tissue or cotton balls. Peri-Wound Care: Triamcinolone 15 (g) 1 x Per Day/30 Days Discharge Instructions: Use triamcinolone 15 (g) as directed Peri-Wound Care: Sween Lotion (Moisturizing lotion) 1 x Per Day/30 Days Discharge Instructions: Apply moisturizing lotion as directed Topical: Triamcinolone 1 x  Per Day/30 Days Discharge Instructions: Apply Triamcinolone as directed Prim Dressing: KerraCel Ag Gelling Fiber Dressing, 2x2 in (silver alginate) (Generic) 1 x Per Day/30 Days ary Discharge Instructions: Apply silver alginate to wound bed as instructed Secondary Dressing: ABD Pad, 8x10 (Generic) 1 x Per Day/30 Days Discharge Instructions: Apply over primary dressing as directed. Secondary Dressing: Woven Gauze Sponge, Non-Sterile 4x4 in 1 x Per Day/30 Days Discharge Instructions: Apply over primary dressing as directed. Compression Wrap: ThreePress (3 layer compression wrap) 1 x Per Day/30 Days Discharge Instructions: Apply three layer compression as directed. Electronic Signature(s) Signed: 09/09/2021 3:34:17 PM By: BRhae HammockRN Signed: 09/09/2021 3:46:42 PM By: SWorthy KeelerPA-C Entered By: BRhae Hammockon 09/09/2021 13:58:39 -------------------------------------------------------------------------------- Problem List Details Patient Name: Date of Service: TCrocker Russell Collier 09/09/2021 1:00 PM Medical Record Number: [id number] Account Number: [id number] of Birth/Sex: Treating RN: 311/02/1960(61y.o. MBurnadette Pop [name] Primary Care Provider: NGarner GavelHN Other Clinician: Referring Provider: Treating Provider/Extender: SDelma FreezeHN Weeks in Treatment: 4 Active Problems ICD-10 Encounter Code Description Active Date MDM Diagnosis I73.89 Other specified peripheral vascular diseases 08/12/2021 No Yes L97.822 Non-pressure chronic ulcer of other part of left lower leg with  fat layer exposed6/21/2023 No Yes I87.333 Chronic venous hypertension (idiopathic) with ulcer and inflammation of 08/12/2021 No Yes bilateral lower extremity E11.622 Type 2 diabetes mellitus with other skin ulcer 08/12/2021 No Yes I25.10 Atherosclerotic heart disease of native coronary artery without angina pectoris 08/12/2021 No Yes Inactive Problems Resolved  Problems Electronic Signature(s) Signed: 09/09/2021 1:07:08 PM By: Worthy Keeler PA-C Entered By: Worthy Keeler on 09/09/2021 13:07:08 -------------------------------------------------------------------------------- Progress Note Details Patient Name: Date of Service: [place] 09/09/2021 1:00 PM Medical Record Number: [id number] Patient Account Number: [id number] Date of Birth/Sex: Treating RN: 01-29-1961 (61 y.o. Erie Noe Primary Care Provider: Garner Gavel HN Other Clinician: Referring Provider: Treating Provider/Extender: Clearance Coots, JO HN Weeks in Treatment: 4 Subjective Chief Complaint Information obtained from Patient Left anterior LE Ulcer History of Present Illness (HPI) ADMISSION 09/14/2018 This is a 61 year old man who traumatized his left anterior tibia area on the bathtub. He describes this initially as a bruise that subsequently reopened. He has been seen in the ER several times and has had at least 3 courses of antibiotics including doxy and Keflex twice. He has been using Neosporin and peroxide on this. Past medical history includes obstructive sleep apnea, cellulitis, hypertension, coronary artery disease, peripheral neuropathy [nondiabetic], currently on a heart murmur for atypical chest pain and bradycardia. 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 ARTERIAL STUDIES done in March 2020 showed an ABI on the left of 0.93, TBI on the left slightly reduced at 0.42. On the right his ABI was 0.95 with a TBI of 0.51 he has monophasic waveforms in the lower extremities bilaterally. Interpreted on the left this showing mild left lower extremity arterial disease as well as on the right. 09/25/18-Patient returns at 1 week to the clinic, noted that arterial studies done in March 2020. We have been using Iodoflex to the wound on the left leg, patient has significant degree of pain and declines to have any debridement 8/21; the patient has not been  here in 3 weeks. Since he was last here he was admitted to hospital on 8/4. He was felt to have stable angina. He has since been to his cardiologist. I note that he has an EF of about 48%. He has a venous wound on the left anterior mid tibia area probably with lymphedema. We have been using Iodoflex. He does not tolerate debridement well. He tells me that his cardiologist has been adjusting his Lasix and then the wraps fall down he tries to pull them up. He is describing pain making it difficult for him to function. 9/3; patient is was hospitalized since he was last here with COPD exacerbation. Came in with Aquacel Ag on the wound. The wound actually looks improved. 9/17; patient was apparently in the ER again with breathing issues and blood pressure issues. He was not admitted. We are using Sorbact of the wound with nice improvement dimensions are better 10/1; the patient's wound is smaller and appears to have a reasonable healthy surface. We are using Sorbact 10/8; the wound is smaller although there is still an area with depth and necrotic debris at the base. We are using Sorbact 10/15; not much change in the wound area. We have been using Sorbact. 10/22; small wound area using endoform since last week 11/5; Mr. Zarrella arrived today in a healed state. Left leg and wrapped. He still does not have compression stockings Readmission: 08-12-2021 upon evaluation today patient presents for evaluation here in the clinic he  has been seen last in 2020 at that point Dr. Dellia Nims was the treating physician. Subsequently the patient tells me that he does have a history of peripheral vascular disease, chronic venous insufficiency, diabetes mellitus type 2, and coronary artery disease. Unfortunately he has had some issues with his right leg he recently has been seen [facility] and they did perform an arteriogram on the right leg I believe even putting a stent in. With that being said that  leg seems to be doing well he mainly just needs a compression sock. In regard to the left leg which is the one in question he tells me the same day he had a surgery he hit the left leg on a piece of furniture on the anterior shin. This caused a area that seems to be a hematoma present at this point. With that being said the patient is having significant discomfort. He tells me. He has been taking ibuprofen which he should not be doing I discussed that with him today. Tylenol is really only should be taken being on the Eliquis. He also tells me that he has not really been put anything on other than a Band-Aid I really think he may benefit from some light compression I do not want to do anything too tight until we get a good review of his arterial flow as his ABIs are not good we got here in the office today was 0.75 on the left 0.65 on the right and to be honest he did not have palpable pulses that I could find. He does not appear to have had an arterial study since 2020 as noted above by Dr. Dellia Nims. 08-19-2021 upon evaluation today patient appears to be doing okay in regard to his wound although he does appear to have some fluid buildup underneath this area. Again I am a little reluctant to try to open this up right now due to the fact that the patient does not appear to have good blood flow based on our initial inspection during the clinic evaluation today. Again he does see his vascular surgeon today. We did not have any palpable pulses that we could find last time I saw him and overall I think that he probably just needs to be seen in order to evaluate and see where things stand. 08-26-2021 upon evaluation today patient appears to be doing well currently in regard to his wound. Has been tolerating the dressing changes without complication. Fortunately we do not have a big open area but he does have some fluid underneath in the center this looks like it might start draining on its own soon which would  be ideal. With that being said right now he has been using the Curlex and Coban wrap which she does tolerate okay. He is going for an arteriogram on 14 July. After that if we need to open this we should be able to presuming they get his blood flow going appropriately. 09-02-2021 upon evaluation today patient appears to be doing about the same in regard to his wound this is starting to drain a lot more so it is definitely opening into something a little bit deeper. With that being said I do see signs of improvement to some degree and that there is not as much fluid buildup down in the base of the wound. I am very pleased with that. He is supposed to be having his vascular intervention on Monday after that hopefully will have better blood flow  we will try to get this open to be getting draining completely and hopefully get this to resolve. He is not really wearing the compression right now because of discomfort that may again also be partially related to the fact that leg does not have sufficient blood flow to be able and heal this appropriately. Again once we get good blood flow hopefully the pain will not be as much we will get better compression on him as well. 09-09-2021 patient underwent the balloon angioplasty of the left peroneal artery on 09-07-2021. It sounds like that the area of stenosis was resolved based on what I am seeing at this point in time. It looks like the Dr. Gwenlyn Saran was satisfied with the blood flow postprocedure which was awesome. The patient had no issues with complication during or following the procedure. He is actually present for evaluation here in the clinic today and the good news is the wound is already looking much better and has only been a couple of days. Fortunately he is not having a terrible amount of pain though is still having some discomfort at this time. I definitely think this is something that we can manage quite nicely. Objective Constitutional Obese and  well-hydrated in no acute distress. Vitals Time Taken: 1:09 PM, Height: 70 in, Weight: 315 lbs, BMI: 45.2, Temperature: 98.6 F, Pulse: 74 bpm, Respiratory Rate: 18 breaths/min, Blood Pressure: 123/74 mmHg. Respiratory normal breathing without difficulty. Psychiatric this patient is able to make decisions and demonstrates good insight into disease process. Alert and Oriented x 3. pleasant and cooperative. General Notes: Patient's wound is actually showing significant signs of improvement already. There are some necrotic tissue that we need to remove I think this is good to help it heal much more quickly. He has good blood flow now gone from 80% stenosis to 0 following his procedure which is great news. Integumentary (Hair, Skin) Wound #2 status is Open. Original cause of wound was Trauma. The date acquired was: 08/03/2021. The wound has been in treatment 4 weeks. The wound is located on the Left,Anterior Lower Leg. The wound measures 1.9cm length x 1.2cm width x 0.2cm depth; 1.791cm^2 area and 0.358cm^3 volume. There is Fat Layer (Subcutaneous Tissue) exposed. There is no tunneling or undermining noted. There is a large amount of serous drainage noted. The wound margin is distinct with the outline attached to the wound base. There is large (67-100%) red granulation within the wound bed. There is a small (1-33%) amount of necrotic tissue within the wound bed. 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 There was a Excisional Skin/Subcutaneous Tissue Debridement with a total area of 2.28 sq cm performed by Worthy Keeler, PA. With the following instrument(s): Curette to remove Viable and Non-Viable tissue/material. Material removed includes Subcutaneous Tissue and Slough and after achieving pain control using Lidocaine. No specimens were taken. A time out was conducted at 13:40, prior to the start of the procedure. A Minimum amount of bleeding was controlled with Pressure. The procedure was tolerated well with a pain level of 0 throughout and  a pain level of 0 following the procedure. Post Debridement Measurements: 1.9cm length x 1.2cm width x 0.2cm depth; 0.358cm^3 volume. Character of Wound/Ulcer Post Debridement is improved. Severity of Tissue Post Debridement is: Fat layer exposed. Post procedure Diagnosis Wound #2: Same as Pre-Procedure Pre-procedure diagnosis of Wound #2 is an Arterial Insufficiency Ulcer located on the Left,Anterior Lower Leg . There was a Three Layer Compression Therapy Procedure by Rhae Hammock, RN. Post procedure Diagnosis Wound #2: Same as Pre-Procedure Plan Follow-up Appointments: Return Appointment in 1 week. - 09/16/21 @ 1345 with Toney Rakes, RN (Room 9) Other: - -Do not use Ibuprofen while on Eliquis Bathing/ Shower/ Hygiene: May shower and wash wound with soap and water. - Use antibacterial soap, do not use peroxide or alcohol Edema Control - Lymphedema / SCD / Other: Elevate legs to the level of the heart or above for 30 minutes daily and/or when sitting, a frequency of: - throughout the day Avoid standing for long periods of time. Patient to wear own compression stockings every day. - Use 15-78mHg Compression stocking for right leg. Additional Orders / Instructions: Stop/Decrease Smoking Follow Nutritious Diet WOUND #2: - Lower Leg Wound Laterality: Left, Anterior Cleanser: Soap and Water 1 x Per Day/30  Days Discharge Instructions: May shower and wash wound with dial antibacterial soap and water prior to dressing change. Cleanser: Wound Cleanser 1 x Per Day/30 Days Discharge Instructions: Cleanse the wound with wound cleanser prior to applying a clean dressing using gauze sponges, not tissue or cotton balls. Peri-Wound Care: Triamcinolone 15 (g) 1 x Per Day/30 Days Discharge Instructions: Use triamcinolone 15 (g) as directed Peri-Wound Care: Sween Lotion (Moisturizing lotion) 1 x Per Day/30 Days Discharge Instructions: Apply moisturizing lotion as directed Topical: Triamcinolone 1 x Per Day/30 Days Discharge Instructions: Apply Triamcinolone as directed Prim Dressing: KerraCel Ag Gelling Fiber Dressing, 2x2 in (silver alginate) (Generic) 1 x Per Day/30 Days ary Discharge Instructions: Apply silver alginate to wound bed as instructed Secondary Dressing: ABD Pad, 8x10 (Generic) 1 x Per Day/30 Days Discharge Instructions: Apply over primary dressing as directed. Secondary Dressing: Woven Gauze Sponge, Non-Sterile 4x4 in 1 x Per Day/30 Days Discharge Instructions: Apply over primary dressing as directed. Com pression Wrap: ThreePress (3 layer compression wrap) 1 x Per Day/30 Days Discharge Instructions: Apply three layer compression as directed. 1. I am going to recommend that we go ahead and switch out the dressing changes a little bit here I would recommend a silver alginate dressing which I think should do quite well for him. 2. Also, recommend initiating a 3 layer compression wrap which I think will help with edema control. 3. I am also going to suggest the patient should continue to elevate his legs much as possible to try to keep everything under control as far as edema is concerned the more of this he can do the better 4. After reviewing his arterial study he seems to have good blood flow at this point following the procedure which was a balloon angioplasty and I am very pleased in that  regard. We will see patient back for reevaluation in 1 week here in the clinic. If anything worsens or changes patient will contact our office for additional recommendations. Electronic Signature(s) Signed: 09/09/2021 2:02:08 PM By: SWorthy KeelerPA-C Entered By: SWorthy Keeleron 09/09/2021 14:02:08 -------------------------------------------------------------------------------- SuperBill Details Patient Name: Date of Service: TOkoboji7/19/2023 Medical Record Number: [id number] Account Number: [id number] of Birth/Sex: Treating RN: 3June 01, 1962(  61 y.o. [name], [name] Primary Care Provider: Garner Gavel HN Other Clinician: Referring Provider: Treating Provider/Extender: Delma Freeze HN Weeks in Treatment: 4 Diagnosis Coding ICD-10 Codes Code Description I73.89 Other specified peripheral vascular diseases 4075689005 Non-pressure chronic ulcer of other part of left lower leg with fat layer exposed I87.333 Chronic venous hypertension (idiopathic) with ulcer and inflammation of bilateral lower extremity E11.622 Type 2 diabetes mellitus with other skin ulcer I25.10 Atherosclerotic heart disease of native coronary artery without angina pectoris Facility Procedures CPT4 Code: 04540981 Description: 19147 - DEB SUBQ TISSUE 20 SQ CM/< ICD-10 Diagnosis Description L97.822 Non-pressure chronic ulcer of other part of left lower leg with fat layer expo Modifier: sed Quantity: 1 Physician Procedures : CPT4 Code Description Modifier [id number] 11042 - WC PHYS SUBQ TISS 20 SQ CM ICD-10 Diagnosis Description L97.822 Non-pressure chronic ulcer of other part of left lower leg with fat layer exposed Quantity: 1 Electronic Signature(s) Signed: 09/09/2021 2:02:17 PM By: Worthy Keeler PA-C Entered By: Worthy Keeler on 09/09/2021 14:02:17

## 2021-09-09 NOTE — Progress Notes (Signed)
PHEONIX, CLINKSCALE ([id number]) Visit Report for 09/09/2021 Arrival Information Details Patient Name: Date of Service: Dwan Bolt 09/09/2021 1:00 PM Medical Record Number: [id number] Patient Account Number: [id number] Date of Birth/Sex: Treating RN: 04/12/1960 (61 y.o. [name], [name] Primary Care Fleda Pagel: Garner Gavel HN Other Clinician: Referring Harlo Jaso: Treating Destine Zirkle/Extender: Delma Freeze HN Weeks in Treatment: 4 Visit Information History Since Last Visit Added or deleted any medications: No Patient Arrived: Wheel Chair Any new allergies or adverse reactions: No Arrival Time: 13:08 Had a fall or experienced change in No Accompanied By: self activities of daily living that may affect Transfer Assistance: None risk of falls: Patient Identification Verified: Yes Signs or symptoms of abuse/neglect since last visito No Secondary Verification Process Completed: Yes Hospitalized since last visit: No Patient Requires Transmission-Based Precautions: No Implantable device outside of the clinic excluding No Patient Has Alerts: Yes cellular tissue based products placed in the center Patient Alerts: Patient on Blood Thinner since last visit: Has Dressing in Place as Prescribed: Yes Pain Present Now: No Electronic Signature(s) Signed: 09/09/2021 3:44:56 PM By: Erenest Blank Entered By: Erenest Blank on 09/09/2021 13:09:53 -------------------------------------------------------------------------------- Compression Therapy Details Patient Name: Date of Service: [name], [name] 09/09/2021 1:00 PM Medical Record Number: [id number] Patient Account Number: [id number] Date of Birth/Sex: Treating RN: 1960-05-31 (61 y.o. Erie Noe Primary Care Antoni Stefan: Garner Gavel HN Other Clinician: Referring Genelda Roark: Treating Mykael Trott/Extender: Clearance Coots, JO HN Weeks in Treatment: 4 Compression Therapy Performed for Wound Assessment: Wound #2  Left,Anterior Lower Leg Performed By: Clinician Rhae Hammock, RN Compression Type: Three Layer Post Procedure Diagnosis Same as Pre-procedure Electronic Signature(s) Signed: 09/09/2021 3:34:17 PM By: Rhae Hammock RN Entered By: Rhae Hammock on 09/09/2021 13:45:39 -------------------------------------------------------------------------------- Encounter Discharge Information Details Patient Name: Date of Service: [place], [name] 09/09/2021 1:00 PM Medical Record Number: [id number] Patient Account Number: [id number] Date of Birth/Sex: Treating RN: 1960-07-01 (61 y.o. Erie Noe Primary Care Quintavia Rogstad: Garner Gavel HN Other Clinician: Referring Reis Goga: Treating Drianna Chandran/Extender: Clearance Coots, JO HN Weeks in Treatment: 4 Encounter Discharge Information Items Post Procedure Vitals Discharge Condition: Stable Temperature (F): 98.6 Ambulatory Status: Ambulatory Pulse (bpm): 74 Discharge Destination: Home Respiratory Rate (breaths/min): 18 Transportation: Private Auto Blood Pressure (mmHg): 123/74 Accompanied By: self Schedule Follow-up Appointment: Yes Clinical Summary of Care: Patient Declined Electronic Signature(s) Signed: 09/09/2021 3:34:17 PM By: Rhae Hammock RN Entered By: Rhae Hammock on 09/09/2021 14:01:01 -------------------------------------------------------------------------------- Lower Extremity Assessment Details Patient Name: Date of Service: [place] 09/09/2021 1:00 PM Medical Record Number: [id number] Patient Account Number: [id number] Date of Birth/Sex: Treating RN: Apr 16, 1960 (61 y.o. [name], [name] Primary Care Sharonda Llamas: Garner Gavel HN Other Clinician: Referring Lucah Petta: Treating Exodus Kutzer/Extender: Clearance Coots, JO HN Weeks in Treatment: 4 Edema Assessment Assessed: [Left: No] [Right: No] Edema: [Left: Ye] [Right: s] Calf Left: Right: Point of Measurement: 37 cm From Medial  Instep 43 cm Ankle Left: Right: Point of Measurement: 10 cm From Medial Instep 24 cm Electronic Signature(s) Signed: 09/09/2021 3:34:17 PM By: Rhae Hammock RN Signed: 09/09/2021 3:44:56 PM By: Erenest Blank Entered By: Erenest Blank on 09/09/2021 13:14:32 -------------------------------------------------------------------------------- Multi-Disciplinary Care Plan Details Patient Name: Date of Service: [name], [name] 09/09/2021 1:00 PM Medical Record Number: [id number] Patient Account Number: [id number] Date of Birth/Sex: Treating RN: Sep 30, 1960 (61 y.o. Erie Noe Primary Care Azari Hasler: Garner Gavel HN Other Clinician: Referring  Toluwani Yadav: Treating Lidiya Reise/Extender: Clearance Coots, JO HN Weeks in Treatment: 4 Active Inactive Tissue Oxygenation Nursing Diagnoses: Actual ineffective tissue perfusion; peripheral (select once diagnosis is confirmed) Goals: Patient/caregiver will verbalize understanding of disease process and disease management Date Initiated: 08/12/2021 Target Resolution Date: 09/09/2021 Goal Status: Active Interventions: Assess peripheral arterial status upon admission and as needed Provide education on tissue oxygenation and ischemia Notes: Wound/Skin Impairment Nursing Diagnoses: Impaired tissue integrity Goals: Patient/caregiver will verbalize understanding of skin care regimen Date Initiated: 08/12/2021 Target Resolution Date: 09/09/2021 Goal Status: Active Ulcer/skin breakdown will have a volume reduction of 30% by week 4 Date Initiated: 08/12/2021 Target Resolution Date: 09/09/2021 Goal Status: Active Interventions: Assess patient/caregiver ability to obtain necessary supplies Assess patient/caregiver ability to perform ulcer/skin care regimen upon admission and as needed Assess ulceration(s) every visit Provide education on smoking Provide education on ulcer and skin care Treatment Activities: Smoking cessation education :  08/12/2021 Topical wound management initiated : 08/12/2021 Notes: Electronic Signature(s) Signed: 09/09/2021 3:34:17 PM By: Rhae Hammock RN Entered By: Rhae Hammock on 09/09/2021 13:39:55 -------------------------------------------------------------------------------- Pain Assessment Details Patient Name: Date of Service: [name], [name] 09/09/2021 1:00 PM Medical Record Number: [id number] Patient Account Number: [id number] Date of Birth/Sex: Treating RN: 10-15-1960 (61 y.o. Erie Noe Primary Care Braden Cimo: Garner Gavel HN Other Clinician: Referring Tavarion Babington: Treating Jasmia Angst/Extender: Clearance Coots, JO HN Weeks in Treatment: 4 Active Problems Location of Pain Severity and Description of Pain Patient Has Paino Yes Site Locations Rate the pain. Rate the pain. Current Pain Level: 10 Pain Management and Medication Current Pain Management: Electronic Signature(s) Signed: 09/09/2021 3:34:17 PM By: Rhae Hammock RN Signed: 09/09/2021 3:44:56 PM By: Erenest Blank Entered By: Erenest Blank on 09/09/2021 13:11:07 -------------------------------------------------------------------------------- Patient/Caregiver Education Details Patient Name: Date of Service: [name], [name] 7/19/2023andnbsp1:00 PM Medical Record Number: [id number] Patient Account Number: [id number] Date of Birth/Gender: Treating RN: 12-Nov-1960 (61 y.o. Erie Noe Primary Care Physician: Garner Gavel HN Other Clinician: Referring Physician: Treating Physician/Extender: Delma Freeze HN Weeks in Treatment: 4 Education Assessment Education Provided To: Patient Education Topics Provided Smoking and Wound Healing: Methods: Explain/Verbal Responses: Reinforcements needed, State content correctly Tissue Oxygenation: Methods: Explain/Verbal Responses: Reinforcements needed, State content correctly Wound/Skin Impairment: Methods:  Explain/Verbal Responses: Reinforcements needed, State content correctly Electronic Signature(s) Signed: 09/09/2021 3:34:17 PM By: Rhae Hammock RN Entered By: Rhae Hammock on 09/09/2021 13:46:45 -------------------------------------------------------------------------------- Wound Assessment Details Patient Name: Date of Service: [name], [name] 09/09/2021 1:00 PM Medical Record Number: [id number] Patient Account Number: [id number] Date of Birth/Sex: Treating RN: [birth year] (61 y.o. [name], [name] Primary Care Karstyn Birkey: Garner Gavel HN Other Clinician: Referring Hollin Crewe: Treating Marselino Slayton/Extender: Clearance Coots, JO HN Weeks in Treatment: 4 Wound Status Wound Number: 2 Primary Arterial Insufficiency Ulcer Etiology: Wound Location: Left, Anterior Lower Leg Wound Open Wounding Event: Trauma Status: Date Acquired: 08/03/2021 Comorbid Hemophilia, Asthma, Sleep Apnea, Angina, Congestive Heart Weeks Of Treatment: 4 History: Failure, Coronary Artery Disease, Hypertension, Peripheral Arterial Clustered Wound: Yes Disease, Type II Diabetes, Osteoarthritis, Neuropathy Photos Wound Measurements Length: (cm) 1.9 Width: (cm) 1.2 Depth: (cm) 0.2 Clustered Quantity: 2 Area: (cm) 1. Volume: (cm) 0. % Reduction in Area: -1.4% % Reduction in Volume: -1.4% Epithelialization: Large (67-100%) Tunneling: No 791 Undermining: No 358 Wound Description Classification: Full Thickness Without Exposed Support Structures Wound Margin: Distinct, outline attached Exudate Amount: Large Exudate Type: Serous Exudate Color: amber Foul Odor After  Cleansing: No Slough/Fibrino Yes Wound Bed Granulation Amount: Large (67-100%) Exposed Structure Granulation Quality: Red Fascia Exposed: No Necrotic Amount: Small (1-33%) Fat Layer (Subcutaneous Tissue) Exposed: Yes Tendon Exposed: No Muscle Exposed: No Joint Exposed: No Bone Exposed: No Treatment Notes Wound #2 (Lower  Leg) Wound Laterality: Left, Anterior Cleanser Soap and Water Discharge Instruction: May shower and wash wound with dial antibacterial soap and water prior to dressing change. Wound Cleanser Discharge Instruction: Cleanse the wound with wound cleanser prior to applying a clean dressing using gauze sponges, not tissue or cotton balls. Peri-Wound Care Triamcinolone 15 (g) Discharge Instruction: Use triamcinolone 15 (g) as directed Sween Lotion (Moisturizing lotion) Discharge Instruction: Apply moisturizing lotion as directed Topical Triamcinolone Discharge Instruction: Apply Triamcinolone as directed Primary Dressing KerraCel Ag Gelling Fiber Dressing, 2x2 in (silver alginate) Discharge Instruction: Apply silver alginate to wound bed as instructed Secondary Dressing ABD Pad, 8x10 Discharge Instruction: Apply over primary dressing as directed. Woven Gauze Sponge, Non-Sterile 4x4 in Discharge Instruction: Apply over primary dressing as directed. Secured With Compression Wrap ThreePress (3 layer compression wrap) Discharge Instruction: Apply three layer compression as directed. Compression Stockings Add-Ons Electronic Signature(s) Signed: 09/09/2021 3:34:17 PM By: Rhae Hammock RN Entered By: Rhae Hammock on 09/09/2021 13:17:40 -------------------------------------------------------------------------------- Vitals Details Patient Name: Date of Service: [place], [name] 09/09/2021 1:00 PM Medical Record Number: [id number] Patient Account Number: [id number] Date of Birth/Sex: Treating RN: 1960/08/23 (61 y.o. [name], [name] Primary Care Aliha Diedrich: Garner Gavel HN Other Clinician: Referring Nitika Jackowski: Treating Allisson Schindel/Extender: Clearance Coots, JO HN Weeks in Treatment: 4 Vital Signs Time Taken: 13:09 Temperature (F): 98.6 Height (in): 70 Pulse (bpm): 74 Weight (lbs): 315 Respiratory Rate (breaths/min): 18 Body Mass Index (BMI): 45.2 Blood Pressure  (mmHg): 123/74 Reference Range: 80 - 120 mg / dl Electronic Signature(s) Signed: 09/09/2021 3:44:56 PM By: Erenest Blank Entered By: Erenest Blank on 09/09/2021 13:10:17

## 2021-09-10 DIAGNOSIS — M5136 Other intervertebral disc degeneration, lumbar region: Secondary | ICD-10-CM | POA: Diagnosis not present

## 2021-09-11 DIAGNOSIS — M5136 Other intervertebral disc degeneration, lumbar region: Secondary | ICD-10-CM | POA: Diagnosis not present

## 2021-09-14 ENCOUNTER — Other Ambulatory Visit: Payer: Self-pay | Admitting: Family Medicine

## 2021-09-14 DIAGNOSIS — M5136 Other intervertebral disc degeneration, lumbar region: Secondary | ICD-10-CM | POA: Diagnosis not present

## 2021-09-15 DIAGNOSIS — M5136 Other intervertebral disc degeneration, lumbar region: Secondary | ICD-10-CM | POA: Diagnosis not present

## 2021-09-16 ENCOUNTER — Encounter (HOSPITAL_BASED_OUTPATIENT_CLINIC_OR_DEPARTMENT_OTHER): Payer: Medicaid Other | Admitting: Internal Medicine

## 2021-09-16 DIAGNOSIS — L97822 Non-pressure chronic ulcer of other part of left lower leg with fat layer exposed: Secondary | ICD-10-CM | POA: Diagnosis not present

## 2021-09-16 DIAGNOSIS — M5136 Other intervertebral disc degeneration, lumbar region: Secondary | ICD-10-CM | POA: Diagnosis not present

## 2021-09-16 DIAGNOSIS — I70248 Atherosclerosis of native arteries of left leg with ulceration of other part of lower left leg: Secondary | ICD-10-CM | POA: Diagnosis not present

## 2021-09-16 NOTE — Progress Notes (Signed)
MACLEAN, FOISTER ([id number]) Visit Report for 09/16/2021 HPI Details Patient Name: Date of Service: Russell Collier 09/16/2021 3:15 PM Medical Record Number: [id number] Patient Account Number: [id number] Date of Birth/Sex: Treating RN: [birth year] (61 y.o. Erie Noe Primary Care Provider: Garner Gavel HN Other Clinician: Referring Provider: Treating Provider/Extender: Adrienne Mocha HN Weeks in Treatment: 5 History of Present Illness HPI Description: ADMISSION 09/14/2018 This is a 61 year old man who traumatized his left anterior tibia area on the bathtub. He describes this initially as a bruise that subsequently reopened. He has been seen in the ER several times and has had at least 3 courses of antibiotics including doxy and Keflex twice. He has been using Neosporin and peroxide on this. Past medical history includes obstructive sleep apnea, cellulitis, hypertension, coronary artery disease, peripheral neuropathy [nondiabetic], currently on a heart murmur for atypical chest pain and bradycardia. 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 ARTERIAL STUDIES done in March 2020 showed an ABI on the left of 0.93, TBI on the left slightly reduced at 0.42. On the right his ABI was 0.95 with a TBI of 0.51 he has monophasic waveforms in the lower extremities bilaterally. Interpreted on the left this showing mild left lower extremity arterial disease as well as on the right. 09/25/18-Patient returns at 1 week to the clinic, noted that arterial studies done in March 2020. We have been using Iodoflex to the wound on the left leg, patient has significant degree of pain and declines to have any debridement 8/21; the patient has not been here in 3 weeks. Since he was last here he was admitted to hospital on 8/4. He was felt to have stable angina. He has since been to his cardiologist. I note that he has an EF of about 48%. He has a venous wound on the left anterior mid tibia area probably  with lymphedema. We have been using Iodoflex. He does not tolerate debridement well. He tells me that his cardiologist has been adjusting his Lasix and then the wraps fall down he tries to pull them up. He is describing pain making it difficult for him to function. 9/3; patient is was hospitalized since he was last here with COPD exacerbation. Came in with Aquacel Ag on the wound. The wound actually looks improved. 9/17; patient was apparently in the ER again with breathing issues and blood pressure issues. He was not admitted. We are using Sorbact of the wound with nice improvement dimensions are better 10/1; the patient's wound is smaller and appears to have a reasonable healthy surface. We are using Sorbact 10/8; the wound is smaller although there is still an area with depth and necrotic debris at the base. We are using Sorbact 10/15; not much change in the wound area. We have been using Sorbact. 10/22; small wound area using endoform since last week 11/5; Russell Collier arrived today in a healed state. Left leg and wrapped. He still does not have compression stockings Readmission: 08-12-2021 upon evaluation today patient presents for evaluation here in the clinic he has been seen last in 2020 at that point Dr. Dellia Nims was the treating physician. Subsequently the patient tells me that he does have a history of peripheral vascular disease, chronic venous insufficiency, diabetes mellitus type 2, and coronary artery disease. Unfortunately he has had some issues with his right leg he recently has been [facility] and they did perform an arteriogram on the right leg I believe even putting  a stent in. With that being said that leg seems to be doing well he mainly just needs a compression sock. In regard to the left leg which is the one in question he tells me the same day he had a surgery he hit the left leg on a piece of furniture on the anterior shin. This caused a area that  seems to be a hematoma present at this point. With that being said the patient is having significant discomfort. He tells me. He has been taking ibuprofen which he should not be doing I discussed that with him today. Tylenol is really only should be taken being on the Eliquis. He also tells me that he has not really been put anything on other than a Band-Aid I really think he may benefit from some light compression I do not want to do anything too tight until we get a good review of his arterial flow as his ABIs are not good we got here in the office today was 0.75 on the left 0.65 on the right and to be honest he did not have palpable pulses that I could find. He does not appear to have had an arterial study since 2020 as noted above by Dr. Dellia Nims. 08-19-2021 upon evaluation today patient appears to be doing okay in regard to his wound although he does appear to have some fluid buildup underneath this area. Again I am a little reluctant to try to open this up right now due to the fact that the patient does not appear to have good blood flow based on our initial inspection during the clinic evaluation today. Again he does see his vascular surgeon today. We did not have any palpable pulses that we could find last time I saw him and overall I think that he probably just needs to be seen in order to evaluate and see where things stand. 08-26-2021 upon evaluation today patient appears to be doing well currently in regard to his wound. Has been tolerating the dressing changes without complication. Fortunately we do not have a big open area but he does have some fluid underneath in the center this looks like it might start draining on its own soon which would be ideal. With that being said right now he has been using the Curlex and Coban wrap which she does tolerate okay. He is going for an arteriogram on 14 July. After that if we need to open this we should be able to presuming they get his blood flow going  appropriately. 09-02-2021 upon evaluation today patient appears to be doing about the same in regard to his wound this is starting to drain a lot more so it is definitely opening into something a little bit deeper. With that being said I do see signs of improvement to some degree and that there is not as much fluid buildup down in the base of the wound. I am very pleased with that. He is supposed to be having his vascular intervention on Monday after that hopefully will have better blood flow we will try to get this open to be getting draining completely and hopefully get this to resolve. He is not really wearing the compression right now because of discomfort that may again also be partially related to the fact that leg does not have sufficient blood flow to be able and heal this appropriately. Again once we get good blood flow hopefully the pain will not be as much we will get better compression on him  as well. 09-09-2021 patient underwent the balloon angioplasty of the left peroneal artery on 09-07-2021. It sounds like that the area of stenosis was resolved based on what I am seeing at this point in time. It looks like the Dr. Gwenlyn Saran was satisfied with the blood flow postprocedure which was awesome. The patient had no issues with complication during or following the procedure. He is actually present for evaluation here in the clinic today and the good news is the wound is already looking much better and has only been a couple of days. Fortunately he is not having a terrible amount of pain though is still having some discomfort at this time. I definitely think this is something that we can manage quite nicely. 7/26; the patient underwent a balloon angioplasty of the left peroneal artery on 09/07/2021 he tolerated this well he has a small open area remaining. Using silver alginate and 3 layer compression Electronic Signature(s) Signed: 09/16/2021 4:38:07 PM By: Linton Ham MD Entered By: Linton Ham on 09/16/2021 16:25:32 -------------------------------------------------------------------------------- Physical Exam Details Patient Name: Date of Service: [place], Russell Collier 09/16/2021 3:15 PM Medical Record Number: [id number] Patient Account Number: [id number] Date of Birth/Sex: Treating RN: Oct 08, 1960 (61 y.o. [name], [name] Primary Care Provider: Garner Gavel HN Other Clinician: Referring Provider: Treating Provider/Extender: Adrienne Mocha HN Weeks in Treatment: 5 Constitutional Sitting or standing Blood Pressure is within target range for patient.. Pulse regular and within target range for patient.Marland Kitchen Respirations regular, non-labored and within target range.. Temperature is normal and within the target range for the patient.Marland Kitchen Appears in no distress. Notes Wound exam; small wound healthy looking granulation slough washed out with saline and gauze. No debridement is required. His edema control is good Engineer, maintenance) Signed: 09/16/2021 4:38:07 PM By: Linton Ham MD Entered By: Linton Ham on 09/16/2021 16:26:25 -------------------------------------------------------------------------------- Physician Orders Details Patient Name: Date of Service: [place], Russell Collier 09/16/2021 3:15 PM Medical Record Number: [id number] Patient Account Number: [id number] Date of Birth/Sex: Treating RN: 12-29-1960 (61 y.o. [name], [name] Primary Care Provider: Garner Gavel HN Other Clinician: Referring Provider: Treating Provider/Extender: Adrienne Mocha HN Weeks in Treatment: 5 Verbal / Phone Orders: No Diagnosis Coding Follow-up Appointments ppointment in 1 week. Margarita Grizzle and Allayne Butcher, RN (Room 9) Return A Other: - -Do not use Ibuprofen while on Eliquis Bathing/ Shower/ Hygiene May shower and wash wound with soap and water. - Use antibacterial soap, do not use peroxide or alcohol Edema Control - Lymphedema / SCD / Other Elevate legs to the  level of the heart or above for 30 minutes daily and/or when sitting, a frequency of: - throughout the day Avoid standing for long periods of time. Patient to wear own compression stockings every day. - Use 15-80mHg Compression stocking for right leg. Additional Orders / Instructions Stop/Decrease Smoking Follow Nutritious Diet Wound Treatment Wound #2 - Lower Leg Wound Laterality: Left, Anterior Cleanser: Soap and Water 1 x Per Day/30 Days Discharge Instructions: May shower and wash wound with dial antibacterial soap and water prior to dressing change. Cleanser: Wound Cleanser 1 x Per Day/30 Days Discharge Instructions: Cleanse the wound with wound cleanser prior to applying a clean dressing using gauze sponges, not tissue or cotton balls. Peri-Wound Care: Triamcinolone 15 (g) 1 x Per Day/30 Days Discharge Instructions: Use triamcinolone 15 (g) as directed Peri-Wound Care: Sween Lotion (Moisturizing lotion) 1 x Per Day/30 Days Discharge Instructions: Apply moisturizing lotion as directed Topical: Triamcinolone  1 x Per Day/30 Days Discharge Instructions: Apply Triamcinolone as directed Prim Dressing: Promogran Prisma Matrix, 4.34 (sq in) (silver collagen) 1 x Per Day/30 Days ary Discharge Instructions: Moisten collagen with saline or hydrogel Secondary Dressing: ABD Pad, 8x10 (Generic) 1 x Per Day/30 Days Discharge Instructions: Apply over primary dressing as directed. Secondary Dressing: Woven Gauze Sponge, Non-Sterile 4x4 in 1 x Per Day/30 Days Discharge Instructions: Apply over primary dressing as directed. Compression Wrap: ThreePress (3 layer compression wrap) 1 x Per Day/30 Days Discharge Instructions: Apply three layer compression as directed. Electronic Signature(s) Signed: 09/16/2021 4:00:27 PM By: Erenest Blank Signed: 09/16/2021 4:38:07 PM By: Linton Ham MD Entered By: Erenest Blank on 09/16/2021  15:49:46 -------------------------------------------------------------------------------- Problem List Details Patient Name: Date of Service: Russell Collier, [place] Collier 09/16/2021 3:15 PM Medical Record Number: [id number] Patient Account Number: [id number] Date of Birth/Sex: Treating RN: 12-Apr-1960 (61 y.o. 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/21/2023 No Yes I87.333 Chronic venous hypertension (idiopathic) with ulcer and inflammation of 08/12/2021 No Yes bilateral lower extremity E11.622 Type 2 diabetes mellitus with other skin ulcer 08/12/2021 No Yes I25.10 Atherosclerotic heart disease of native coronary artery without angina pectoris 08/12/2021 No Yes Inactive Problems Resolved Problems Electronic Signature(s) Signed: 09/16/2021 4:38:07 PM By: Linton Ham MD Entered By: Linton Ham on 09/16/2021 16:19:57 -------------------------------------------------------------------------------- Progress Note Details Patient Name: Date of Service: Russell Collier, Russell Collier 09/16/2021 3:15 PM Medical Record Number: [id number] Patient Account Number: [id number] Date of Birth/Sex: Treating RN: 1960-10-25 (61 y.o. [name], [name] Primary Care Provider: Garner Gavel HN Other Clinician: Referring Provider: Treating Provider/Extender: Adrienne Mocha HN Weeks in Treatment: 5 Subjective History of Present Illness (HPI) ADMISSION 09/14/2018 This is a 61 year old man who traumatized his left anterior tibia area on the bathtub. He describes this initially as a bruise that subsequently reopened. He has been seen in the  ER several times and has had at least 3 courses of antibiotics including doxy and Keflex twice. He has been using Neosporin and peroxide on this. Past medical history includes obstructive sleep apnea, cellulitis, hypertension, coronary artery disease, peripheral neuropathy [nondiabetic], currently on a heart murmur for atypical chest pain and bradycardia. 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 ARTERIAL STUDIES done in March 2020 showed an ABI on the left of 0.93, TBI on the left slightly reduced at 0.42. On the right his ABI was 0.95 with a TBI of 0.51 he has monophasic waveforms in the lower extremities bilaterally. Interpreted on the left this showing mild left lower extremity arterial disease as well as on the right. 09/25/18-Patient returns at 1 week to the clinic, noted that arterial studies done in March 2020. We have been using Iodoflex to the wound on the left leg, patient has significant degree of pain and declines to have any debridement 8/21; the patient has not been here in 3 weeks. Since he was last here he was admitted to hospital on 8/4. He was felt to have stable angina. He has since been to his cardiologist. I note that he has an EF of about 48%. He has a venous wound on the left anterior mid tibia area probably with lymphedema. We have been using  Iodoflex. He does not tolerate debridement well. He tells me that his cardiologist has been adjusting his Lasix and then the wraps fall down he tries to pull them up. He is describing pain making it difficult for him to function. 9/3; patient is was hospitalized since he was last here with COPD exacerbation. Came in with Aquacel Ag on the wound. The wound actually looks improved. 9/17; patient was apparently in the ER again with breathing issues and blood pressure issues. He was not admitted. We are using Sorbact of the wound with nice improvement dimensions are better 10/1; the patient's wound is smaller and appears to have a reasonable  healthy surface. We are using Sorbact 10/8; the wound is smaller although there is still an area with depth and necrotic debris at the base. We are using Sorbact 10/15; not much change in the wound area. We have been using Sorbact. 10/22; small wound area using endoform since last week 11/5; Russell Collier arrived today in a healed state. Left leg and wrapped. He still does not have compression stockings Readmission: 08-12-2021 upon evaluation today patient presents for evaluation here in the clinic he has been seen last in 2020 at that point Dr. Dellia Nims was the treating physician. Subsequently the patient tells me that he does have a history of peripheral vascular disease, chronic venous insufficiency, diabetes mellitus type 2, and coronary artery disease. Unfortunately he has had some issues with his right leg he recently has been [facility] and they did perform an arteriogram on the right leg I believe even putting a stent in. With that being said that leg seems to be doing well he mainly just needs a compression sock. In regard to the left leg which is the one in question he tells me the same day he had a surgery he hit the left leg on a piece of furniture on the anterior shin. This caused a area that seems to be a hematoma present at this point. With that being said the patient is having significant discomfort. He tells me. He has been taking ibuprofen which he should not be doing I discussed that with him today. Tylenol is really only should be taken being on the Eliquis. He also tells me that he has not really been put anything on other than a Band-Aid I really think he may benefit from some light compression I do not want to do anything too tight until we get a good review of his arterial flow as his ABIs are not good we got here in the office today was 0.75 on the left 0.65 on the right and to be honest he did not have palpable pulses that I could find. He does not  appear to have had an arterial study since 2020 as noted above by Dr. Dellia Nims. 08-19-2021 upon evaluation today patient appears to be doing okay in regard to his wound although he does appear to have some fluid buildup underneath this area. Again I am a little reluctant to try to open this up right now due to the fact that the patient does not appear to have good blood flow based on our initial inspection during the clinic evaluation today. Again he does see his vascular surgeon today. We did not have any palpable pulses that we could find last time I saw him and overall I think that he probably just needs to be seen in order to evaluate and see where things stand. 08-26-2021 upon  evaluation today patient appears to be doing well currently in regard to his wound. Has been tolerating the dressing changes without complication. Fortunately we do not have a big open area but he does have some fluid underneath in the center this looks like it might start draining on its own soon which would be ideal. With that being said right now he has been using the Curlex and Coban wrap which she does tolerate okay. He is going for an arteriogram on 14 July. After that if we need to open this we should be able to presuming they get his blood flow going appropriately. 09-02-2021 upon evaluation today patient appears to be doing about the same in regard to his wound this is starting to drain a lot more so it is definitely opening into something a little bit deeper. With that being said I do see signs of improvement to some degree and that there is not as much fluid buildup down in the base of the wound. I am very pleased with that. He is supposed to be having his vascular intervention on Monday after that hopefully will have better blood flow we will try to get this open to be getting draining completely and hopefully get this to resolve. He is not really wearing the compression right now because of discomfort that may again  also be partially related to the fact that leg does not have sufficient blood flow to be able and heal this appropriately. Again once we get good blood flow hopefully the pain will not be as much we will get better compression on him as well. 09-09-2021 patient underwent the balloon angioplasty of the left peroneal artery on 09-07-2021. It sounds like that the area of stenosis was resolved based on what I am seeing at this point in time. It looks like the Dr. Gwenlyn Saran was satisfied with the blood flow postprocedure which was awesome. The patient had no issues with complication during or following the procedure. He is actually present for evaluation here in the clinic today and the good news is the wound is already looking much better and has only been a couple of days. Fortunately he is not having a terrible amount of pain though is still having some discomfort at this time. I definitely think this is something that we can manage quite nicely. 7/26; the patient underwent a balloon angioplasty of the left peroneal artery on 09/07/2021 he tolerated this well he has a small open area remaining. Using silver alginate and 3 layer compression Objective Constitutional Sitting or standing Blood Pressure is within target range for patient.. Pulse regular and within target range for patient.Marland Kitchen Respirations regular, non-labored and within target range.. Temperature is normal and within the target range for the patient.Marland Kitchen Appears in no distress. Vitals Time Taken: 3:06 PM, Height: 70 in, Weight: 315 lbs, BMI: 45.2, Temperature: 98.9 F, Pulse: 91 bpm, Respiratory Rate: 18 breaths/min, Blood Pressure: 129/72 mmHg. General Notes: Wound exam; small wound healthy looking granulation slough washed out with saline and gauze. No debridement is required. His edema control is good Integumentary (Hair, Skin) Wound #2 status is Open. Original cause of wound was Trauma. The date acquired was: 08/03/2021. The wound has been in  treatment 5 weeks. The wound is located on the Left,Anterior Lower Leg. The wound measures 1.3cm length x 1.5cm width x 0.2cm depth; 1.532cm^2 area and 0.306cm^3 volume. There is Fat Layer (Subcutaneous Tissue) exposed. There is no tunneling or undermining noted. There is a large amount of  serous drainage noted. The wound margin is distinct with the outline attached to the wound base. There is medium (34-66%) red granulation within the wound bed. There is a small (1-33%) amount of necrotic tissue within the wound bed including Adherent Slough. Assessment Active Problems ICD-10 Other specified peripheral vascular diseases Non-pressure chronic ulcer of other part of left lower leg with fat layer exposed Chronic venous hypertension (idiopathic) with ulcer and inflammation of bilateral lower extremity Type 2 diabetes mellitus with other skin ulcer Atherosclerotic heart disease of native coronary artery without angina pectoris Procedures Wound #2 Pre-procedure diagnosis of Wound #2 is an Arterial Insufficiency Ulcer located on the Left,Anterior Lower Leg . There was a Three Layer Compression Therapy Procedure by Rhae Hammock, RN. Post procedure Diagnosis Wound #2: Same as Pre-Procedure Plan Follow-up Appointments: Return Appointment in 1 week. Margarita Grizzle and Allayne Butcher, RN (Room 9) Other: - -Do not use Ibuprofen while on Eliquis Bathing/ Shower/ Hygiene: May shower and wash wound with soap and water. - Use antibacterial soap, do not use peroxide or alcohol Edema Control - Lymphedema / SCD / Other: Elevate legs to the level of the heart or above for 30 minutes daily and/or when sitting, a frequency of: - throughout the day Avoid standing for long periods of time. Patient to wear own compression stockings every day. - Use 15-49mHg Compression stocking for right leg. Additional Orders / Instructions: Stop/Decrease Smoking Follow Nutritious Diet WOUND #2: - Lower Leg Wound Laterality: Left,  Anterior Cleanser: Soap and Water 1 x Per Day/30 Days Discharge Instructions: May shower and wash wound with dial antibacterial soap and water prior to dressing change. Cleanser: Wound Cleanser 1 x Per Day/30 Days Discharge Instructions: Cleanse the wound with wound cleanser prior to applying a clean dressing using gauze sponges, not tissue or cotton balls. Peri-Wound Care: Triamcinolone 15 (g) 1 x Per Day/30 Days Discharge Instructions: Use triamcinolone 15 (g) as directed Peri-Wound Care: Sween Lotion (Moisturizing lotion) 1 x Per Day/30 Days Discharge Instructions: Apply moisturizing lotion as directed Topical: Triamcinolone 1 x Per Day/30 Days Discharge Instructions: Apply Triamcinolone as directed Prim Dressing: Promogran Prisma Matrix, 4.34 (sq in) (silver collagen) 1 x Per Day/30 Days ary Discharge Instructions: Moisten collagen with saline or hydrogel Secondary Dressing: ABD Pad, 8x10 (Generic) 1 x Per Day/30 Days Discharge Instructions: Apply over primary dressing as directed. Secondary Dressing: Woven Gauze Sponge, Non-Sterile 4x4 in 1 x Per Day/30 Days Discharge Instructions: Apply over primary dressing as directed. Com pression Wrap: ThreePress (3 layer compression wrap) 1 x Per Day/30 Days Discharge Instructions: Apply three layer compression as directed. 1. #1 this is a small wound has roughly 2 or 3 mm of depth this slough literally washed out with saline and gauze I did not have to do a mechanical debridement. I had wanted to use endoform and this however apparently we do not have this in the clinic to use collagen instead still under the same compression Electronic Signature(s) Signed: 09/16/2021 4:38:07 PM By: RLinton HamMD Entered By: RLinton Hamon 09/16/2021 16:27:55 -------------------------------------------------------------------------------- SuperBill Details Patient Name: Date of Service: TJanell Collier Russell Collier 09/16/2021 Medical Record Number:  [id number] Account Number: [id number] of Birth/Sex: Treating RN: [id number]-Jan-[id number](61y.o. 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

## 2021-09-17 DIAGNOSIS — M5136 Other intervertebral disc degeneration, lumbar region: Secondary | ICD-10-CM | POA: Diagnosis not present

## 2021-09-18 DIAGNOSIS — M5136 Other intervertebral disc degeneration, lumbar region: Secondary | ICD-10-CM | POA: Diagnosis not present

## 2021-09-21 DIAGNOSIS — M5136 Other intervertebral disc degeneration, lumbar region: Secondary | ICD-10-CM | POA: Diagnosis not present

## 2021-09-22 DIAGNOSIS — M5136 Other intervertebral disc degeneration, lumbar region: Secondary | ICD-10-CM | POA: Diagnosis not present

## 2021-09-23 ENCOUNTER — Other Ambulatory Visit: Payer: Self-pay | Admitting: Family Medicine

## 2021-09-23 ENCOUNTER — Other Ambulatory Visit (HOSPITAL_COMMUNITY): Payer: Self-pay

## 2021-09-23 DIAGNOSIS — M5136 Other intervertebral disc degeneration, lumbar region: Secondary | ICD-10-CM | POA: Diagnosis not present

## 2021-09-24 ENCOUNTER — Encounter (HOSPITAL_BASED_OUTPATIENT_CLINIC_OR_DEPARTMENT_OTHER): Payer: Medicaid Other | Attending: Internal Medicine | Admitting: Internal Medicine

## 2021-09-24 DIAGNOSIS — L97822 Non-pressure chronic ulcer of other part of left lower leg with fat layer exposed: Secondary | ICD-10-CM | POA: Diagnosis not present

## 2021-09-24 DIAGNOSIS — G4733 Obstructive sleep apnea (adult) (pediatric): Secondary | ICD-10-CM | POA: Insufficient documentation

## 2021-09-24 DIAGNOSIS — I7389 Other specified peripheral vascular diseases: Secondary | ICD-10-CM | POA: Insufficient documentation

## 2021-09-24 DIAGNOSIS — I10 Essential (primary) hypertension: Secondary | ICD-10-CM | POA: Insufficient documentation

## 2021-09-24 DIAGNOSIS — M5136 Other intervertebral disc degeneration, lumbar region: Secondary | ICD-10-CM | POA: Diagnosis not present

## 2021-09-24 DIAGNOSIS — E11622 Type 2 diabetes mellitus with other skin ulcer: Secondary | ICD-10-CM | POA: Insufficient documentation

## 2021-09-24 DIAGNOSIS — Z79899 Other long term (current) drug therapy: Secondary | ICD-10-CM | POA: Diagnosis not present

## 2021-09-24 DIAGNOSIS — J449 Chronic obstructive pulmonary disease, unspecified: Secondary | ICD-10-CM | POA: Insufficient documentation

## 2021-09-24 DIAGNOSIS — G629 Polyneuropathy, unspecified: Secondary | ICD-10-CM | POA: Insufficient documentation

## 2021-09-24 DIAGNOSIS — I87333 Chronic venous hypertension (idiopathic) with ulcer and inflammation of bilateral lower extremity: Secondary | ICD-10-CM | POA: Insufficient documentation

## 2021-09-24 DIAGNOSIS — E11621 Type 2 diabetes mellitus with foot ulcer: Secondary | ICD-10-CM | POA: Diagnosis present

## 2021-09-24 DIAGNOSIS — I872 Venous insufficiency (chronic) (peripheral): Secondary | ICD-10-CM | POA: Diagnosis not present

## 2021-09-24 DIAGNOSIS — I2510 Atherosclerotic heart disease of native coronary artery without angina pectoris: Secondary | ICD-10-CM | POA: Diagnosis not present

## 2021-09-24 NOTE — Progress Notes (Signed)
Russell Collier ([id number]) Visit Report for 09/24/2021 Chief Complaint Document Details Patient Name: Date of Service: Russell Collier 09/24/2021 2:30 PM Medical Record Number: [id number] Patient Account Number: [id number] Date of Birth/Sex: Treating RN: [birth year] (61 y.o. [name], [name] Primary Care Provider: Alen Bleacher Other Clinician: Referring Provider: Treating Provider/Extender: Jannet Askew in Treatment: 6 Information Obtained from: Patient Chief Complaint Left anterior LE Ulcer Electronic Signature(s) Signed: 09/24/2021 3:55:06 PM By: Kalman Shan DO Entered By: Kalman Shan on 09/24/2021 15:41:59 -------------------------------------------------------------------------------- HPI Details Patient Name: Date of Service: Russell Collier 09/24/2021 2:30 PM Medical Record Number: [id number] Patient Account Number: [id number] Date of Birth/Sex: Treating RN: October 12, 1960 (61 y.o. [name], [name] Primary Care Provider: Alen Bleacher Other Clinician: Referring Provider: Treating Provider/Extender: Jannet Askew in Treatment: 6 History of Present Illness HPI Description: ADMISSION 09/14/2018 This is a 61 year old man who traumatized his left anterior tibia area on the bathtub. He describes this initially as a bruise that subsequently reopened. He has been seen in the ER several times and has had at least 3 courses of antibiotics including doxy and Keflex twice. He has been using Neosporin and peroxide on this. Past medical history includes obstructive sleep apnea, cellulitis, hypertension, coronary artery disease, peripheral neuropathy [nondiabetic], currently on a heart murmur for atypical chest pain and bradycardia. 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 ARTERIAL STUDIES done in March 2020 showed an ABI on the left of 0.93, TBI on the left slightly reduced at 0.42. On the right his ABI was 0.95 with a TBI of 0.51 he  has monophasic waveforms in the lower extremities bilaterally. Interpreted on the left this showing mild left lower extremity arterial disease as well as on the right. 09/25/18-Patient returns at 1 week to the clinic, noted that arterial studies done in March 2020. We have been using Iodoflex to the wound on the left leg, patient has significant degree of pain and declines to have any debridement 8/21; the patient has not been here in 3 weeks. Since he was last here he was admitted to hospital on 8/4. He was felt to have stable angina. He has since been to his cardiologist. I note that he has an EF of about 48%. He has a venous wound on the left anterior mid tibia area probably with lymphedema. We have been using Iodoflex. He does not tolerate debridement well. He tells me that his cardiologist has been adjusting his Lasix and then the wraps fall down he tries to pull them up. He is describing pain making it difficult for him to function. 9/3; patient is was hospitalized since he was last here with COPD exacerbation. Came in with Aquacel Ag on the wound. The wound actually looks improved. 9/17; patient was apparently in the ER again with breathing issues and blood pressure issues. He was not admitted. We are using Sorbact of the wound with nice improvement dimensions are better 10/1; the patient's wound is smaller and appears to have a reasonable healthy surface. We are using Sorbact 10/8; the wound is smaller although there is still an area with depth and necrotic debris at the base. We are using Sorbact 10/15; not much change in the wound area. We have been using Sorbact. 10/22; small wound area using endoform since last week 11/5; Russell Collier arrived today in a healed state. Left leg and wrapped. He still does not have compression stockings Readmission: 08-12-2021 upon evaluation today patient presents for evaluation here in the  clinic he has been seen last in 2020 at that point Dr. Dellia Nims was the  treating physician. Subsequently the patient tells me that he does have a history of peripheral vascular disease, chronic venous insufficiency, diabetes mellitus type 2, and coronary artery disease. Unfortunately he has had some issues with his right leg he recently has been [facility] and they did perform an arteriogram on the right leg I believe even putting a stent in. With that being said that leg seems to be doing well he mainly just needs a compression sock. In regard to the left leg which is the one in question he tells me the same day he had a surgery he hit the left leg on a piece of furniture on the anterior shin. This caused a area that seems to be a hematoma present at this point. With that being said the patient is having significant discomfort. He tells me. He has been taking ibuprofen which he should not be doing I discussed that with him today. Tylenol is really only should be taken being on the Eliquis. He also tells me that he has not really been put anything on other than a Band-Aid I really think he may benefit from some light compression I do not want to do anything too tight until we get a good review of his arterial flow as his ABIs are not good we got here in the office today was 0.75 on the left 0.65 on the right and to be honest he did not have palpable pulses that I could find. He does not appear to have had an arterial study since 2020 as noted above by Dr. Dellia Nims. 08-19-2021 upon evaluation today patient appears to be doing okay in regard to his wound although he does appear to have some fluid buildup underneath this area. Again I am a little reluctant to try to open this up right now due to the fact that the patient does not appear to have good blood flow based on our initial inspection during the clinic evaluation today. Again he does see his vascular surgeon today. We did not have any palpable pulses that we could find last time I saw him and  overall I think that he probably just needs to be seen in order to evaluate and see where things stand. 08-26-2021 upon evaluation today patient appears to be doing well currently in regard to his wound. Has been tolerating the dressing changes without complication. Fortunately we do not have a big open area but he does have some fluid underneath in the center this looks like it might start draining on its own soon which would be ideal. With that being said right now he has been using the Curlex and Coban wrap which she does tolerate okay. He is going for an arteriogram on 14 July. After that if we need to open this we should be able to presuming they get his blood flow going appropriately. 09-02-2021 upon evaluation today patient appears to be doing about the same in regard to his wound this is starting to drain a lot more so it is definitely opening into something a little bit deeper. With that being said I do see signs of improvement to some degree and that there is not as much fluid buildup down in the base of the wound. I am very pleased with that. He is supposed to be having his vascular intervention on Monday after that hopefully will have better  blood flow we will try to get this open to be getting draining completely and hopefully get this to resolve. He is not really wearing the compression right now because of discomfort that may again also be partially related to the fact that leg does not have sufficient blood flow to be able and heal this appropriately. Again once we get good blood flow hopefully the pain will not be as much we will get better compression on him as well. 09-09-2021 patient underwent the balloon angioplasty of the left peroneal artery on 09-07-2021. It sounds like that the area of stenosis was resolved based on what I am seeing at this point in time. It looks like the Dr. Gwenlyn Saran was satisfied with the blood flow postprocedure which was awesome. The patient had no issues with  complication during or following the procedure. He is actually present for evaluation here in the clinic today and the good news is the wound is already looking much better and has only been a couple of days. Fortunately he is not having a terrible amount of pain though is still having some discomfort at this time. I definitely think this is something that we can manage quite nicely. 7/26; the patient underwent a balloon angioplasty of the left peroneal artery on 09/07/2021 he tolerated this well he has a small open area remaining. Using silver alginate and 3 layer compression 8/3; patient presents for follow-up. We have been using collagen under 3 layer compression. He has no issues or complaints today. Electronic Signature(s) Signed: 09/24/2021 3:55:06 PM By: Kalman Shan DO Entered By: Kalman Shan on 09/24/2021 15:42:19 -------------------------------------------------------------------------------- Physical Exam Details Patient Name: Date of Service: Russell Collier 09/24/2021 2:30 PM Medical Record Number: [id number] Patient Account Number: [id number] Date of Birth/Sex: Treating RN: 10-03-1960 (61 y.o. E[place] Noe Primary Care Provider: Alen Bleacher Other Clinician: Referring Provider: Treating Provider/Extender: Jannet Askew in Treatment: 6 Constitutional respirations regular, non-labored and within target range for patient.. Cardiovascular 2+ dorsalis pedis/posterior tibialis pulses. Psychiatric pleasant and cooperative. Notes Left leg: Small open wound with granulation tissue and fibrinous tissue at the inferior portion. No signs of surrounding infection. Good edema control. Electronic Signature(s) Signed: 09/24/2021 3:55:06 PM By: Kalman Shan DO Entered By: Kalman Shan on 09/24/2021 15:43:24 -------------------------------------------------------------------------------- Physician Orders Details Patient Name: Date of  Service: [place], RO Collier 09/24/2021 2:30 PM Medical Record Number: [id number] Patient Account Number: [id number] Date of Birth/Sex: Treating RN: 04-Sep-1960 (61 y.o. [name], [name] Primary Care Provider: Alen Bleacher Other Clinician: Referring Provider: Treating Provider/Extender: Jannet Askew in Treatment: 6 Verbal / Phone Orders: No Diagnosis Coding ICD-10 Coding Code Description I73.89 Other specified peripheral vascular diseases L97.822 Non-pressure chronic ulcer of other part of left lower leg with fat layer exposed I87.333 Chronic venous hypertension (idiopathic) with ulcer and inflammation of bilateral lower extremity E11.622 Type 2 diabetes mellitus with other skin ulcer I25.10 Atherosclerotic heart disease of native coronary artery without angina pectoris Follow-up Appointments ppointment in 1 week. - 10/02/21 @ 1515 w/ Dr. Heber Allen and Allayne Butcher Room # 9 Return A Other: - -Do not use Ibuprofen while on Eliquis Bathing/ Shower/ Hygiene May shower and wash wound with soap and water. - Use antibacterial soap, do not use peroxide or alcohol Edema Control - Lymphedema / SCD / Other Elevate legs to the level of the heart or above for 30 minutes daily and/or when sitting, a frequency of: - throughout the day Avoid standing for long  periods of time. Patient to wear own compression stockings every day. - Use 15-2mHg Compression stocking for right leg. Additional Orders / Instructions Stop/Decrease Smoking Follow Nutritious Diet Wound Treatment Wound #2 - Lower Leg Wound Laterality: Left, Anterior Cleanser: Soap and Water 1 x Per Day/30 Days Discharge Instructions: May shower and wash wound with dial antibacterial soap and water prior to dressing change. Cleanser: Wound Cleanser 1 x Per Day/30 Days Discharge Instructions: Cleanse the wound with wound cleanser prior to applying a clean dressing using gauze sponges, not tissue or cotton  balls. Peri-Wound Care: Triamcinolone 15 (g) 1 x Per Day/30 Days Discharge Instructions: Use triamcinolone 15 (g) as directed Peri-Wound Care: Sween Lotion (Moisturizing lotion) 1 x Per Day/30 Days Discharge Instructions: Apply moisturizing lotion as directed Topical: Triamcinolone 1 x Per Day/30 Days Discharge Instructions: Apply Triamcinolone as directed Prim Dressing: Promogran Prisma Matrix, 4.34 (sq in) (silver collagen) 1 x Per Day/30 Days ary Discharge Instructions: Moisten collagen with saline or hydrogel Secondary Dressing: ABD Pad, 8x10 (Generic) 1 x Per Day/30 Days Discharge Instructions: Apply over primary dressing as directed. Secondary Dressing: Woven Gauze Sponge, Non-Sterile 4x4 in 1 x Per Day/30 Days Discharge Instructions: Apply over primary dressing as directed. Compression Wrap: ThreePress (3 layer compression wrap) 1 x Per Day/30 Days Discharge Instructions: Apply three layer compression as directed. Electronic Signature(s) Signed: 09/24/2021 3:55:06 PM By: HKalman ShanDO Signed: 09/24/2021 3:56:15 PM By: BRhae HammockRN Entered By: BRhae Hammockon 09/24/2021 15:49:00 -------------------------------------------------------------------------------- Problem List Details Patient Name: Date of Service: [place] Collier 09/24/2021 2:30 PM Medical Record Number: [id number] Account Number: [id number] of Birth/Sex: Treating RN: 308-May-1962(61y.o. MBurnadette Pop [name] Primary Care Provider: NAlen BleacherOther Clinician: Referring Provider: Treating Provider/Extender: HJannet Askewin Treatment: 6 Active Problems ICD-10 Encounter Code Description Active Date MDM Diagnosis I73.89 Other specified peripheral vascular diseases 08/12/2021 No Yes L97.822 Non-pressure chronic ulcer of other part of left lower leg with fat layer exposed6/21/2023 No Yes I87.333 Chronic venous hypertension (idiopathic) with ulcer and inflammation  of 08/12/2021 No Yes bilateral lower extremity E11.622 Type 2 diabetes mellitus with other skin ulcer 08/12/2021 No Yes I25.10 Atherosclerotic heart disease of native coronary artery without angina pectoris 08/12/2021 No Yes Inactive Problems Resolved Problems Electronic Signature(s) Signed: 09/24/2021 3:55:06 PM By: HKalman ShanDO Entered By: HKalman Shanon 09/24/2021 15:41:46 -------------------------------------------------------------------------------- Progress Note Details Patient Name: Date of Service: [place] Col[place] 09/24/2021 2:30 PM Medical Record Number: [id number] Account Number: [id number] of Birth/Sex: Treating RN: [birth year](61y.o. MBurnadette Pop [name] Primary Care Provider: NAlen BleacherOther Clinician: Referring Provider: Treating Provider/Extender: HJannet Askewin Treatment: 6 Subjective Chief Complaint Information obtained from Patient Left anterior LE Ulcer History of Present Illness (HPI) ADMISSION 09/14/2018 This is a 61year old man who traumatized his left anterior tibia area on the bathtub. He describes this initially as a bruise that subsequently reopened. He has been seen in the ER several times and has had at least 3 courses of antibiotics including doxy and Keflex twice. He has been using Neosporin and peroxide on this. Past medical history includes obstructive sleep apnea, cellulitis, hypertension, coronary artery disease, peripheral neuropathy [nondiabetic], currently on a heart murmur for atypical chest pain and bradycardia. 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 ARTERIAL STUDIES done in March 2020 showed an ABI on the left of 0.93, TBI on the left slightly reduced at 0.42. On the right his ABI was 0.95 with  a TBI of 0.51 he has monophasic waveforms in the lower extremities bilaterally. Interpreted on the left this showing mild left lower extremity arterial disease as well as on the  right. 09/25/18-Patient returns at 1 week to the clinic, noted that arterial studies done in March 2020. We have been using Iodoflex to the wound on the left leg, patient has significant degree of pain and declines to have any debridement 8/21; the patient has not been here in 3 weeks. Since he was last here he was admitted to hospital on 8/4. He was felt to have stable angina. He has since been to his cardiologist. I note that he has an EF of about 48%. He has a venous wound on the left anterior mid tibia area probably with lymphedema. We have been using Iodoflex. He does not tolerate debridement well. He tells me that his cardiologist has been adjusting his Lasix and then the wraps fall down he tries to pull them up. He is describing pain making it difficult for him to function. 9/3; patient is was hospitalized since he was last here with COPD exacerbation. Came in with Aquacel Ag on the wound. The wound actually looks improved. 9/17; patient was apparently in the ER again with breathing issues and blood pressure issues. He was not admitted. We are using Sorbact of the wound with nice improvement dimensions are better 10/1; the patient's wound is smaller and appears to have a reasonable healthy surface. We are using Sorbact 10/8; the wound is smaller although there is still an area with depth and necrotic debris at the base. We are using Sorbact 10/15; not much change in the wound area. We have been using Sorbact. 10/22; small wound area using endoform since last week 11/5; Mr. Bezek arrived today in a healed state. Left leg and wrapped. He still does not have compression stockings Readmission: 08-12-2021 upon evaluation today patient presents for evaluation here in the clinic he has been seen last in 2020 at that point Dr. Dellia Nims was the treating physician. Subsequently the patient tells me that he does have a history of peripheral vascular disease, chronic venous insufficiency, diabetes mellitus  type 2, and coronary artery disease. Unfortunately he has had some issues with his right leg he recently has been seen by [facility] and they did perform an arteriogram on the right leg I believe even putting a stent in. With that being said that leg seems to be doing well he mainly just needs a compression sock. In regard to the left leg which is the one in question he tells me the same day he had a surgery he hit the left leg on a piece of furniture on the anterior shin. This caused a area that seems to be a hematoma present at this point. With that being said the patient is having significant discomfort. He tells me. He has been taking ibuprofen which he should not be doing I discussed that with him today. Tylenol is really only should be taken being on the Eliquis. He also tells me that he has not really been put anything on other than a Band-Aid I really think he may benefit from some light compression I do not want to do anything too tight until we get a good review of his arterial flow as his ABIs are not good we got here in the office today was 0.75 on the left 0.65 on the right and to be honest he did not have palpable  pulses that I could find. He does not appear to have had an arterial study since 2020 as noted above by Dr. Dellia Nims. 08-19-2021 upon evaluation today patient appears to be doing okay in regard to his wound although he does appear to have some fluid buildup underneath this area. Again I am a little reluctant to try to open this up right now due to the fact that the patient does not appear to have good blood flow based on our initial inspection during the clinic evaluation today. Again he does see his vascular surgeon today. We did not have any palpable pulses that we could find last time I saw him and overall I think that he probably just needs to be seen in order to evaluate and see where things stand. 08-26-2021 upon evaluation today patient appears to be doing  well currently in regard to his wound. Has been tolerating the dressing changes without complication. Fortunately we do not have a big open area but he does have some fluid underneath in the center this looks like it might start draining on its own soon which would be ideal. With that being said right now he has been using the Curlex and Coban wrap which she does tolerate okay. He is going for an arteriogram on 14 July. After that if we need to open this we should be able to presuming they get his blood flow going appropriately. 09-02-2021 upon evaluation today patient appears to be doing about the same in regard to his wound this is starting to drain a lot more so it is definitely opening into something a little bit deeper. With that being said I do see signs of improvement to some degree and that there is not as much fluid buildup down in the base of the wound. I am very pleased with that. He is supposed to be having his vascular intervention on Monday after that hopefully will have better blood flow we will try to get this open to be getting draining completely and hopefully get this to resolve. He is not really wearing the compression right now because of discomfort that may again also be partially related to the fact that leg does not have sufficient blood flow to be able and heal this appropriately. Again once we get good blood flow hopefully the pain will not be as much we will get better compression on him as well. 09-09-2021 patient underwent the balloon angioplasty of the left peroneal artery on 09-07-2021. It sounds like that the area of stenosis was resolved based on what I am seeing at this point in time. It looks like the Dr. Gwenlyn Saran was satisfied with the blood flow postprocedure which was awesome. The patient had no issues with complication during or following the procedure. He is actually present for evaluation here in the clinic today and the good news is the wound is already looking much  better and has only been a couple of days. Fortunately he is not having a terrible amount of pain though is still having some discomfort at this time. I definitely think this is something that we can manage quite nicely. 7/26; the patient underwent a balloon angioplasty of the left peroneal artery on 09/07/2021 he tolerated this well he has a small open area remaining. Using silver alginate and 3 layer compression 8/3; patient presents for follow-up. We have been using collagen under 3 layer compression. He has no issues or complaints today. Patient History Information obtained from Patient. Family History Cancer -  Father, Diabetes - Father,Paternal Grandparents, Heart Disease - Mother, Hypertension - Mother, Kidney Disease - Maternal Grandparents, Lung Disease - Father, Stroke - Father, No family history of Hereditary Spherocytosis, Seizures, Thyroid Problems, Tuberculosis. 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 Medical History Eyes Denies history of Cataracts, Glaucoma, Optic Neuritis Hematologic/Lymphatic Patient has history of Hemophilia - Borderline Denies history of Anemia, Human Immunodeficiency Virus, Lymphedema, Sickle Cell Disease Respiratory Patient has history of Asthma, Sleep Apnea Denies history of Aspiration, Chronic Obstructive Pulmonary Disease (COPD), Pneumothorax, Tuberculosis Cardiovascular Patient has history of Angina - Atypical, Congestive Heart Failure, Coronary Artery Disease, Hypertension, Peripheral Arterial Disease Denies history of Arrhythmia, Deep Vein Thrombosis, Hypotension, Myocardial Infarction, Peripheral Venous Disease, Phlebitis, Vasculitis Gastrointestinal Denies history of Cirrhosis , Colitis, Crohnoos, Hepatitis A, Hepatitis B, Hepatitis C Endocrine Patient has history of Type II Diabetes Denies history of Type I  Diabetes Genitourinary Denies history of End Stage Renal Disease Immunological Denies history of Lupus Erythematosus, Raynaudoos, Scleroderma Integumentary (Skin) Denies history of History of Burn Musculoskeletal Patient has history of Osteoarthritis Denies history of Gout, Rheumatoid Arthritis, Osteomyelitis Neurologic Patient has history of Neuropathy Denies history of Dementia, Quadriplegia, Paraplegia, Seizure Disorder Oncologic Denies history of Received Chemotherapy, Received Radiation Psychiatric Denies history of Anorexia/bulimia, Confinement Anxiety Hospitalization/Surgery History [facility] 09/2018 - Acute Resp Failure. - cardiac cath with stent 12/19/18. 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 Vitals Time Taken: 2:48 PM, Height: 70 in, Weight: 315 lbs, BMI: 45.2, Temperature: 98.3 F, Pulse: 64 bpm, Respiratory Rate: 17 breaths/min, Blood Pressure: 101/62 mmHg. Cardiovascular 2+ dorsalis pedis/posterior tibialis pulses. Psychiatric pleasant and cooperative. General Notes: Left leg: Small open wound with granulation tissue and fibrinous tissue at the inferior portion. No signs of surrounding infection. Good edema control. Integumentary (Hair, Skin) Wound #2 status is Open. Original cause of wound was Trauma. The date acquired was: 08/03/2021. The wound has been in treatment 6 weeks. The wound is located on the Left,Anterior Lower Leg. The wound measures 1.2cm length x 0.5cm width x 0.3cm depth; 0.471cm^2 area and 0.141cm^3 volume. There is Fat Layer (Subcutaneous Tissue) exposed. There is no tunneling or undermining noted. There is a large amount of serous drainage noted. The wound margin is distinct with the outline attached to the wound base. There is medium (34-66%) red  granulation within the wound bed. There is a small (1-33%) amount of necrotic tissue within the wound bed including Adherent Slough. 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 I recommended continuing the course with collagen under 3 layer compression. Follow-up in 1 week. Procedures Wound #2 Pre-procedure diagnosis of Wound #2 is an Arterial Insufficiency Ulcer located on the Left,Anterior Lower Leg . There was a Three Layer Compression Therapy Procedure by Adline Peals, RN. Post procedure Diagnosis Wound #2: Same as Pre-Procedure Plan 1. Collagen under 3 layer compression 2. Follow-up in 1 week Electronic Signature(s) Signed: 09/24/2021 3:55:06 PM By: Heber Elmwood,  Janett Billow DO Entered By: Kalman Shan on 09/24/2021 15:44:16 -------------------------------------------------------------------------------- HxROS Details Patient Name: Date of Service: Russell Collier 09/24/2021 2:30 PM Medical Record Number: [id number] Patient Account Number: [id number] Date of Birth/Sex: Treating RN: 26-Sep-1960 (61 y.o. [name], [name] Primary Care Provider: Alen Bleacher Other Clinician: Referring Provider: Treating Provider/Extender: Jannet Askew in Treatment: 6 Information Obtained From Patient Eyes Medical History: Negative for: Cataracts; Glaucoma; Optic Neuritis Hematologic/Lymphatic Medical History: Positive for: Hemophilia - Borderline Negative for: Anemia; Human Immunodeficiency Virus; Lymphedema; Sickle Cell  Disease Respiratory Medical History: Positive for: Asthma; Sleep Apnea Negative for: Aspiration; Chronic Obstructive Pulmonary Disease (COPD); Pneumothorax; Tuberculosis Past Medical History Notes: Pulmonary Embolism Cardiovascular Medical History: Positive for: Angina - Atypical; Congestive Heart Failure; Coronary Artery Disease; Hypertension; Peripheral Arterial Disease Negative for: Arrhythmia; Deep Vein Thrombosis; Hypotension; Myocardial Infarction; Peripheral Venous Disease; Phlebitis; Vasculitis Past Medical History Notes: Hyperlipidemia, Bradycardia, 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 Gastrointestinal Medical History: Negative for: Cirrhosis ; Colitis; Crohns; Hepatitis A; Hepatitis B; Hepatitis C Endocrine Medical History: Positive for: Type II Diabetes Negative for: Type I Diabetes Treated with: Oral agents Blood sugar tested every day: No Genitourinary Medical History: Negative for: End Stage Renal Disease Immunological Medical History: Negative for: Lupus Erythematosus; Raynauds; Scleroderma Integumentary (Skin) Medical History: Negative for: History of Burn Musculoskeletal Medical History: Positive for: Osteoarthritis Negative for: Gout; Rheumatoid Arthritis; Osteomyelitis Neurologic Medical History: Positive for: Neuropathy Negative for: Dementia; Quadriplegia; Paraplegia; Seizure Disorder Oncologic Medical History: Negative for: Received Chemotherapy; Received Radiation Psychiatric Medical History: Negative for: Anorexia/bulimia; Confinement Anxiety Immunizations Pneumococcal Vaccine: Received Pneumococcal Vaccination: Yes Received Pneumococcal Vaccination On or After 60th Birthday: No Implantable Devices Yes Hospitalization / Surgery History Type of Hospitalization/Surgery [facility] 09/2018 - Acute Resp Failure cardiac cath with stent 12/19/18 Family and Social History Cancer: Yes - Father; Diabetes: Yes - Father,Paternal Grandparents; Heart Disease:  Yes - Mother; Hereditary Spherocytosis: No; Hypertension: Yes - Mother; Kidney Disease: Yes - Maternal Grandparents; Lung Disease: Yes - Father; Seizures: No; Stroke: Yes - Father; Thyroid Problems: No; Tuberculosis: No; Current every day smoker - Cigars 1 PPD; Marital Status - Divorced; Alcohol Use: Rarely; Drug Use: Prior History - Cocaine; Caffeine Use: Rarely; Financial Concerns: No; Food, Clothing or Shelter Needs: No; Support System Lacking: No; Transportation Concerns: No Electronic Signature(s) Signed: 09/24/2021 3:55:06 PM By: Kalman Shan DO Signed: 09/24/2021 3:56:15 PM By: Rhae Hammock RN Entered By: Kalman Shan on 09/24/2021 15:42:25 -------------------------------------------------------------------------------- SuperBill Details Patient Name: Date of Service: [name], RO Collier 09/24/2021 Medical Record Number: [id number] Patient Account Number: [id number] Date of Birth/Sex: Treating RN: Nov 15, 1960 (61 y.o. [name], [name] Primary Care Provider: Alen Bleacher Other Clinician: Referring Provider: Treating Provider/Extender: Jannet Askew in Treatment: 6 Diagnosis Coding ICD-10 Codes Code Description I73.89 Other specified peripheral vascular diseases L97.822 Non-pressure chronic ulcer of other part of left lower leg with fat layer exposed I87.333 Chronic venous hypertension (idiopathic) with ulcer and inflammation of bilateral lower extremity E11.622 Type 2 diabetes mellitus with other skin ulcer I25.10 Atherosclerotic heart disease of native coronary artery without angina pectoris Facility Procedures CPT4 Code: 31517616 Description: (Facility Use Only) 29581LT - [place] LWR LT LEG Modifier: Quantity: 1 Physician Procedures : CPT4 Code Description Modifier [id number] - WC PHYS LEVEL 3 - EST PT ICD-10 Diagnosis Description L97.822 Non-pressure chronic ulcer of other part of left lower leg with fat layer exposed I87.333  Chronic venous hypertension (idiopathic) with ulcer  and inflammation of bilateral lower  extremity I73.89 Other specified peripheral vascular diseases E11.622 Type 2 diabetes mellitus with other skin ulcer Quantity: 1 Electronic Signature(s) Signed: 09/24/2021 3:55:06 PM By: Kalman Shan DO Signed: 09/24/2021 3:56:15 PM By: Rhae Hammock RN Entered By: Rhae Hammock on 09/24/2021 15:49:51

## 2021-09-24 NOTE — Progress Notes (Signed)
REG, BIRCHER ([id number]) Visit Report for 09/24/2021 Arrival Information Details Patient Name: Date of Service: Russell Collier 09/24/2021 2:30 PM Medical Record Number: [id number] Patient Account Number: [id number] Date of Birth/Sex: Treating RN: Feb 07, 1961 (61 y.o. [name], [name] Primary Care Mylan Lengyel: Alen Bleacher Other Clinician: Referring Sal Spratley: Treating Taggart Prasad/Extender: Jannet Askew in Treatment: 6 Visit Information History Since Last Visit Added or deleted any medications: No Patient Arrived: Ambulatory Any new allergies or adverse reactions: No Arrival Time: 14:32 Had a fall or experienced change in No Accompanied By: self activities of daily living that may affect Transfer Assistance: None risk of falls: Patient Identification Verified: Yes Signs or symptoms of abuse/neglect since last visito No Secondary Verification Process Completed: Yes Hospitalized since last visit: No Patient Requires Transmission-Based Precautions: No Implantable device outside of the clinic excluding No Patient Has Alerts: Yes cellular tissue based products placed in the center Patient Alerts: Patient on Blood Thinner since last visit: Has Dressing in Place as Prescribed: Yes Has Compression in Place as Prescribed: Yes Pain Present Now: No Electronic Signature(s) Signed: 09/24/2021 3:56:15 PM By: Rhae Hammock RN Entered By: Rhae Hammock on 09/24/2021 14:35:36 -------------------------------------------------------------------------------- Compression Therapy Details Patient Name: Date of Service: Russell Collier, Russell Collier 09/24/2021 2:30 PM Medical Record Number: [id number] Patient Account Number: [id number] Date of Birth/Sex: Treating RN: [birth year] (61 y.o. Janyth Contes Primary Care Malayna Noori: Alen Bleacher Other Clinician: Referring Sammy Cassar: Treating Shalonda Sachse/Extender: Jannet Askew in Treatment: 6 Compression Therapy  Performed for Wound Assessment: Wound #2 Left,Anterior Lower Leg Performed By: Clinician Adline Peals, RN Compression Type: Three Layer Post Procedure Diagnosis Same as Pre-procedure Electronic Signature(s) Signed: 09/24/2021 3:37:06 PM By: Adline Peals Entered By: Adline Peals on 09/24/2021 15:26:10 -------------------------------------------------------------------------------- Encounter Discharge Information Details Patient Name: Date of Service: Russell Collier, Russell Collier 09/24/2021 2:30 PM Medical Record Number: [id number] Patient Account Number: [id number] Date of Birth/Sex: Treating RN: January 29, 1961 (61 y.o. [name], [name] Primary Care Keymani Mclean: Alen Bleacher Other Clinician: Referring Wren Gallaga: Treating Virda Betters/Extender: Jannet Askew in Treatment: 6 Encounter Discharge Information Items Discharge Condition: Stable Ambulatory Status: Walker Discharge Destination: Home Transportation: Private Auto Accompanied By: self Schedule Follow-up Appointment: Yes Clinical Summary of Care: Patient Declined Electronic Signature(s) Signed: 09/24/2021 3:56:15 PM By: Rhae Hammock RN Entered By: Rhae Hammock on 09/24/2021 15:50:33 -------------------------------------------------------------------------------- Lower Extremity Assessment Details Patient Name: Date of Service: Russell Collier, Russell Collier 09/24/2021 2:30 PM Medical Record Number: [id number] Patient Account Number: [id number] Date of Birth/Sex: Treating RN: 08-May-1960 (61 y.o. Janyth Contes Primary Care Siddhi Dornbush: Alen Bleacher Other Clinician: Referring Ryo Klang: Treating Zahniya Zellars/Extender: Jannet Askew in Treatment: 6 Edema Assessment Assessed: [name]: Yes] [name]: No] Edema: [Left: Ye] [Right: s] Calf Left: Right: Point of Measurement: 37 cm From Medial Instep 43 cm Ankle Left: Right: Point of Measurement: 10 cm From Medial Instep 24 cm Vascular  Assessment Pulses: Dorsalis Pedis Palpable: [Left:Yes] Posterior Tibial Palpable: [Left:Yes] Electronic Signature(s) Signed: 09/24/2021 3:37:06 PM By: Adline Peals Entered By: Adline Peals on 09/24/2021 14:49:18 -------------------------------------------------------------------------------- Multi Wound Chart Details Patient Name: Date of Service: Russell Collier, Russell Collier 09/24/2021 2:30 PM Medical Record Number: [id number] Patient Account Number: [id number] Date of Birth/Sex: Treating RN: 1961/01/03 (61 y.o. [name], [name] Primary Care Brenten Janney: Alen Bleacher Other Clinician: Referring Bridie Colquhoun: Treating Darianne Muralles/Extender: Jannet Askew in Treatment: 6 Vital Signs Height(in): 70 Pulse(bpm): 64 Weight(lbs): 315 Blood Pressure(mmHg): 101/62 Body Mass Index(BMI): 45.2 Temperature(F): 98.3 Respiratory Rate(breaths/min):  35 Photos: [N/A:N/A] Left, Anterior Lower Leg N/A N/A Wound Location: Trauma N/A N/A Wounding Event: Arterial Insufficiency Ulcer N/A N/A Primary Etiology: Hemophilia, Asthma, Sleep Apnea, N/A N/A Comorbid History: Angina, Congestive Heart Failure, Coronary Artery Disease, Hypertension, Peripheral Arterial Disease, Type II Diabetes, Osteoarthritis, Neuropathy 08/03/2021 N/A N/A Date Acquired: 6 N/A N/A Weeks of Treatment: Open N/A N/A Wound Status: No N/A N/A Wound Recurrence: Yes N/A N/A Clustered Wound: 2 N/A N/A Clustered Quantity: 1.2x0.5x0.3 N/A N/A Measurements L x W x D (cm) 0.471 N/A N/A A (cm) : rea 0.141 N/A N/A Volume (cm) : 73.30% N/A N/A % Reduction in Area: 60.10% N/A N/A % Reduction in Volume: Full Thickness Without Exposed N/A N/A Classification: Support Structures Large N/A N/A Exudate Amount: Serous N/A N/A Exudate Type: amber N/A N/A Exudate Color: Distinct, outline attached N/A N/A Wound Margin: Medium (34-66%) N/A N/A Granulation Amount: Red N/A N/A Granulation  Quality: Small (1-33%) N/A N/A Necrotic Amount: Fat Layer (Subcutaneous Tissue): Yes N/A N/A Exposed Structures: Fascia: No Tendon: No Muscle: No Joint: No Bone: No Large (67-100%) N/A N/A Epithelialization: Compression Therapy N/A N/A Procedures Performed: Treatment Notes Electronic Signature(s) Signed: 09/24/2021 3:55:06 PM By: Kalman Shan DO Signed: 09/24/2021 3:56:15 PM By: Rhae Hammock RN Entered By: Kalman Shan on 09/24/2021 15:41:51 -------------------------------------------------------------------------------- Multi-Disciplinary Care Plan Details Patient Name: Date of Service: Russell Collier, Russell Collier 09/24/2021 2:30 PM Medical Record Number: [id number] Patient Account Number: [id number] Date of Birth/Sex: Treating RN: 02/22/1961 (61 y.o. [name], [name] Primary Care Charnele Semple: Alen Bleacher Other Clinician: Referring Kadelyn Dimascio: Treating Sharmon Cheramie/Extender: Jannet Askew in Treatment: 6 Active Inactive Wound/Skin Impairment Nursing Diagnoses: Impaired tissue integrity Goals: Patient/caregiver will verbalize understanding of skin care regimen Date Initiated: 08/12/2021 Date Inactivated: 09/16/2021 Target Resolution Date: 09/09/2021 Goal Status: Met Ulcer/skin breakdown will have a volume reduction of 30% by week 4 Date Initiated: 08/12/2021 Target Resolution Date: 10/24/2021 Goal Status: Active Interventions: Assess patient/caregiver ability to obtain necessary supplies Assess patient/caregiver ability to perform ulcer/skin care regimen upon admission and as needed Assess ulceration(s) every visit Provide education on smoking Provide education on ulcer and skin care Treatment Activities: Smoking cessation education : 08/12/2021 Topical wound management initiated : 08/12/2021 Notes: Electronic Signature(s) Signed: 09/24/2021 3:56:15 PM By: Rhae Hammock RN Entered By: Rhae Hammock on 09/24/2021  15:49:10 -------------------------------------------------------------------------------- Pain Assessment Details Patient Name: Date of Service: Russell Collier, Russell Collier 09/24/2021 2:30 PM Medical Record Number: [id number] Patient Account Number: [id number] Date of Birth/Sex: Treating RN: 09-Oct-1960 (61 y.o. [name], [name] Primary Care Jesselle Laflamme: Alen Bleacher Other Clinician: Referring Jniyah Dantuono: Treating Brinkley Peet/Extender: Jannet Askew in Treatment: 6 Active Problems Location of Pain Severity and Description of Pain Patient Has Paino No Site Locations Pain Management and Medication Current Pain Management: Electronic Signature(s) Signed: 09/24/2021 3:37:06 PM By: Adline Peals Signed: 09/24/2021 3:56:15 PM By: Rhae Hammock RN Entered By: Adline Peals on 09/24/2021 14:49:11 -------------------------------------------------------------------------------- Patient/Caregiver Education Details Patient Name: Date of Service: Russell Collier 8/3/2023andnbsp2:30 PM Medical Record Number: [id number] Patient Account Number: [id number] Date of Birth/Gender: Treating RN: 02/02/1961 (61 y.o. Erie Noe Primary Care Physician: Alen Bleacher Other Clinician: Referring Physician: Treating Physician/Extender: Jannet Askew in Treatment: 6 Education Assessment Education Provided To: Patient Education Topics Provided Wound/Skin Impairment: Methods: Explain/Verbal Responses: Reinforcements needed, State content correctly Electronic Signature(s) Signed: 09/24/2021 3:56:15 PM By: Rhae Hammock RN Entered By: Rhae Hammock on 09/24/2021 15:49:25 -------------------------------------------------------------------------------- Wound Assessment Details Patient Name: Date of Service: Russell Collier 09/24/2021 2:30 PM Medical Record Number: [id number] Patient Account Number: [id number] Date of Birth/Sex: Treating  RN: [birth year] (61 y.o. Janyth Contes Primary Care Albertus Chiarelli: Alen Bleacher Other Clinician: Referring Peighton Edgin: Treating Marcia Hartwell/Extender: Jannet Askew in Treatment: 6 Wound Status Wound Number: 2 Primary Arterial Insufficiency Ulcer Etiology: Wound Location: Left, Anterior Lower Leg Wound Open Wounding Event: Trauma Status: Date Acquired: 08/03/2021 Comorbid Hemophilia, Asthma, Sleep Apnea, Angina, Congestive Heart Weeks Of Treatment: 6 History: Failure, Coronary Artery Disease, Hypertension, Peripheral Arterial Clustered Wound: Yes Disease, Type II Diabetes, Osteoarthritis, Neuropathy Photos Wound Measurements Length: (cm) 1.2 Width: (cm) 0.5 Depth: (cm) 0.3 Clustered Quantity: 2 Area: (cm) 0.471 Volume: (cm) 0.141 % Reduction in Area: 73.3% % Reduction in Volume: 60.1% Epithelialization: Large (67-100%) Tunneling: No Undermining: No Wound Description Classification: Full Thickness Without Exposed Support Structures Wound Margin: Distinct, outline attached Exudate Amount: Large Exudate Type: Serous Exudate Color: amber Foul Odor After Cleansing: No Slough/Fibrino Yes Wound Bed Granulation Amount: Medium (34-66%) Exposed Structure Granulation Quality: Red Fascia Exposed: No Necrotic Amount: Small (1-33%) Fat Layer (Subcutaneous Tissue) Exposed: Yes Necrotic Quality: Adherent Slough Tendon Exposed: No Muscle Exposed: No Joint Exposed: No Bone Exposed: No Treatment Notes Wound #2 (Lower Leg) Wound Laterality: Left, Anterior Cleanser Soap and Water Discharge Instruction: May shower and wash wound with dial antibacterial soap and water prior to dressing change. Wound Cleanser Discharge Instruction: Cleanse the wound with wound cleanser prior to applying a clean dressing using gauze sponges, not tissue or cotton balls. Peri-Wound Care Triamcinolone 15 (g) Discharge Instruction: Use triamcinolone 15 (g) as directed Sween Lotion  (Moisturizing lotion) Discharge Instruction: Apply moisturizing lotion as directed Topical Triamcinolone Discharge Instruction: Apply Triamcinolone as directed Primary Dressing Promogran Prisma Matrix, 4.34 (sq in) (silver collagen) Discharge Instruction: Moisten collagen with saline or hydrogel Secondary Dressing ABD Pad, 8x10 Discharge Instruction: Apply over primary dressing as directed. Woven Gauze Sponge, Non-Sterile 4x4 in Discharge Instruction: Apply over primary dressing as directed. Secured With Compression Wrap ThreePress (3 layer compression wrap) Discharge Instruction: Apply three layer compression as directed. Compression Stockings Add-Ons Electronic Signature(s) Signed: 09/24/2021 3:37:06 PM By: Adline Peals Signed: 09/24/2021 3:56:15 PM By: Rhae Hammock RN Entered By: Rhae Hammock on 09/24/2021 14:54:45 -------------------------------------------------------------------------------- Vitals Details Patient Name: Date of Service: Russell Collier, Russell Collier 09/24/2021 2:30 PM Medical Record Number: [id number] Patient Account Number: [id number] Date of Birth/Sex: Treating RN: Nov 27, 1960 (61 y.o. Janyth Contes Primary Care [employer]: Alen Bleacher Other Clinician: Referring Tykira Wachs: Treating Ulrick Methot/Extender: Jannet Askew in Treatment: 6 Vital Signs Time Taken: 14:48 Temperature (F): 98.3 Height (in): 70 Pulse (bpm): 64 Weight (lbs): 315 Respiratory Rate (breaths/min): 17 Body Mass Index (BMI): 45.2 Blood Pressure (mmHg): 101/62 Reference Range: 80 - 120 mg / dl Electronic Signature(s) Signed: 09/24/2021 3:37:06 PM By: Adline Peals Entered By: Adline Peals on 09/24/2021 14:49:06

## 2021-09-25 DIAGNOSIS — M5136 Other intervertebral disc degeneration, lumbar region: Secondary | ICD-10-CM | POA: Diagnosis not present

## 2021-09-28 ENCOUNTER — Other Ambulatory Visit (HOSPITAL_COMMUNITY): Payer: Self-pay

## 2021-09-28 DIAGNOSIS — M5136 Other intervertebral disc degeneration, lumbar region: Secondary | ICD-10-CM | POA: Diagnosis not present

## 2021-09-28 NOTE — Progress Notes (Signed)
ZABDIEL, DRIPPS ([id number]) Visit Report for 09/16/2021 Arrival Information Details Patient Name: Date of Service: Russell Collier 09/16/2021 3:15 PM Medical Record Number: [id number] Patient Account Number: [id number] Date of Birth/Sex: Treating RN: May 11, 1960 (61 y.o. [name], [name] Primary Care Shaquira Moroz: Alen Bleacher Other Clinician: Referring Kaedynce Tapp: Treating Petar Mucci/Extender: Suzzette Righter in Treatment: 5 Visit Information History Since Last Visit Added or deleted any medications: No Patient Arrived: Russell Collier Any new allergies or adverse reactions: No Arrival Time: 15:04 Had a fall or experienced change in No Accompanied By: self activities of daily living that may affect Transfer Assistance: None risk of falls: Patient Identification Verified: Yes Signs or symptoms of abuse/neglect since last visito No Secondary Verification Process Completed: Yes Hospitalized since last visit: No Patient Requires Transmission-Based Precautions: No Implantable device outside of the clinic excluding No Patient Has Alerts: Yes cellular tissue based products placed in the center Patient Alerts: Patient on Blood Thinner since last visit: Has Dressing in Place as Prescribed: No Has Compression in Place as Prescribed: No Pain Present Now: Yes Electronic Signature(s) Signed: 09/16/2021 4:00:27 PM By: Erenest Blank Entered By: Erenest Blank on 09/16/2021 15:06:14 -------------------------------------------------------------------------------- Compression Therapy Details Patient Name: Date of Service: Russell Collier 09/16/2021 3:15 PM Medical Record Number: [id number] Patient Account Number: [id number] Date of Birth/Sex: Treating RN: 10/22/1960 (61 y.o. Erie Noe Primary Care Carlesha Seiple: Alen Bleacher Other Clinician: Referring Dorothymae Maciver: Treating Torii Royse/Extender: Suzzette Righter in Treatment: 5 Compression Therapy Performed for  Wound Assessment: Wound #2 Left,Anterior Lower Leg Performed By: Clinician Rhae Hammock, RN Compression Type: Three Layer Post Procedure Diagnosis Same as Pre-procedure Electronic Signature(s) Signed: 09/16/2021 4:00:27 PM By: Erenest Blank Entered By: Erenest Blank on 09/16/2021 15:48:14 -------------------------------------------------------------------------------- Encounter Discharge Information Details Patient Name: Date of Service: Russell Collier, Russell Collier 09/16/2021 3:15 PM Medical Record Number: [id number] Patient Account Number: [id number] Date of Birth/Sex: Treating RN: 1960/03/15 (61 y.o. Mare Ferrari Primary Care Akasha Melena: Alen Bleacher Other Clinician: Referring Naydeline Morace: Treating Saidy Ormand/Extender: Suzzette Righter in Treatment: 5 Encounter Discharge Information Items Discharge Condition: Stable Ambulatory Status: Ambulatory Discharge Destination: Home Transportation: Private Auto Accompanied By: self Schedule Follow-up Appointment: Yes Clinical Summary of Care: Patient Declined Electronic Signature(s) Signed: 09/16/2021 4:47:33 PM By: Sharyn Creamer RN, BSN Entered By: Sharyn Creamer on 09/16/2021 16:25:20 -------------------------------------------------------------------------------- Lower Extremity Assessment Details Patient Name: Date of Service: Russell Collier 09/16/2021 3:15 PM Medical Record Number: [id number] Patient Account Number: [id number] Date of Birth/Sex: Treating RN: 02-16-1961 (61 y.o. [name], [name] Primary Care Bowe Sidor: Alen Bleacher Other Clinician: Referring Tamora Huneke: Treating Trendon Zaring/Extender: Suzzette Righter in Treatment: 5 Edema Assessment Assessed: [name]: No] [name]: No] Edema: [Left: Ye] [Right: s] Calf Left: Right: Point of Measurement: 37 cm From Medial Instep 43 cm Ankle Left: Right: Point of Measurement: 10 cm From Medial Instep 24 cm Vascular  Assessment Pulses: Dorsalis Pedis Palpable: [Left:Yes] Electronic Signature(s) Signed: 09/16/2021 4:00:27 PM By: Erenest Blank Signed: 09/28/2021 4:45:01 PM By: Rhae Hammock RN Entered By: Erenest Blank on 09/16/2021 15:26:31 -------------------------------------------------------------------------------- Multi Wound Chart Details Patient Name: Date of Service: Russell Collier, Russell Collier 09/16/2021 3:15 PM Medical Record Number: [id number] Patient Account Number: [id number] Date of Birth/Sex: Treating RN: [birth year] (61 y.o. [name], [name] Primary Care Matty Deamer: Alen Bleacher Other Clinician: Referring Glorianne Proctor: Treating Kinnley Paulson/Extender: Suzzette Righter in Treatment: 5 Vital Signs Height(in): 70 Pulse(bpm): 69 Weight(lbs): 315 Blood Pressure(mmHg): 129/72 Body Mass Index(BMI): 32.2  Temperature(F): 98.9 Respiratory Rate(breaths/min): 18 Photos: [N/A:N/A] Left, Anterior Lower Leg N/A N/A Wound Location: Trauma N/A N/A Wounding Event: Arterial Insufficiency Ulcer N/A N/A Primary Etiology: Hemophilia, Asthma, Sleep Apnea, N/A N/A Comorbid History: Angina, Congestive Heart Failure, Coronary Artery Disease, Hypertension, Peripheral Arterial Disease, Type II Diabetes, Osteoarthritis, Neuropathy 08/03/2021 N/A N/A Date Acquired: 5 N/A N/A Weeks of Treatment: Open N/A N/A Wound Status: No N/A N/A Wound Recurrence: Yes N/A N/A Clustered Wound: 2 N/A N/A Clustered Quantity: 1.3x1.5x0.2 N/A N/A Measurements L x W x D (cm) 1.532 N/A N/A A (cm) : rea 0.306 N/A N/A Volume (cm) : 13.30% N/A N/A % Reduction in Area: 13.30% N/A N/A % Reduction in Volume: Full Thickness Without Exposed N/A N/A Classification: Support Structures Large N/A N/A Exudate Amount: Serous N/A N/A Exudate Type: amber N/A N/A Exudate Color: Distinct, outline attached N/A N/A Wound Margin: Medium (34-66%) N/A N/A Granulation Amount: Red N/A N/A Granulation  Quality: Small (1-33%) N/A N/A Necrotic Amount: Fat Layer (Subcutaneous Tissue): Yes N/A N/A Exposed Structures: Fascia: No Tendon: No Muscle: No Joint: No Bone: No Large (67-100%) N/A N/A Epithelialization: Compression Therapy N/A N/A Procedures Performed: Treatment Notes Electronic Signature(s) Signed: 09/16/2021 4:38:07 PM By: Linton Ham MD Signed: 09/28/2021 4:45:01 PM By: Rhae Hammock RN Entered By: Linton Ham on 09/16/2021 16:20:25 -------------------------------------------------------------------------------- Multi-Disciplinary Care Plan Details Patient Name: Date of Service: Russell Collier, Russell Collier 09/16/2021 3:15 PM Medical Record Number: [id number] Patient Account Number: [id number] Date of Birth/Sex: Treating RN: 08-30-1960 (61 y.o. [name], [name] Primary Care Rickayla Wieland: Alen Bleacher Other Clinician: Referring Alvera Tourigny: Treating Creedence Heiss/Extender: Suzzette Righter in Treatment: 5 Active Inactive Wound/Skin Impairment Nursing Diagnoses: Impaired tissue integrity Goals: Patient/caregiver will verbalize understanding of skin care regimen Date Initiated: 08/12/2021 Date Inactivated: 09/16/2021 Target Resolution Date: 09/09/2021 Goal Status: Met Ulcer/skin breakdown will have a volume reduction of 30% by week 4 Date Initiated: 08/12/2021 Target Resolution Date: 09/23/2021 Goal Status: Active Interventions: Assess patient/caregiver ability to obtain necessary supplies Assess patient/caregiver ability to perform ulcer/skin care regimen upon admission and as needed Assess ulceration(s) every visit Provide education on smoking Provide education on ulcer and skin care Treatment Activities: Smoking cessation education : 08/12/2021 Topical wound management initiated : 08/12/2021 Notes: Electronic Signature(s) Signed: 09/16/2021 4:00:27 PM By: Erenest Blank Signed: 09/28/2021 4:45:01 PM By: Rhae Hammock RN Entered By: Erenest Blank  on 09/16/2021 15:27:53 -------------------------------------------------------------------------------- Pain Assessment Details Patient Name: Date of Service: Russell Collier, Russell Collier 09/16/2021 3:15 PM Medical Record Number: [id number] Patient Account Number: [id number] Date of Birth/Sex: Treating RN: [birth year] (61 y.o. [name], [name] Primary Care Omid Deardorff: Alen Bleacher Other Clinician: Referring Alyzae Hawkey: Treating Tyjai Charbonnet/Extender: Suzzette Righter in Treatment: 5 Active Problems Location of Pain Severity and Description of Pain Patient Has Paino Yes Site Locations Pain Location: Pain Location: Pain in Ulcers Rate the pain. Current Pain Level: 4 Pain Management and Medication Current Pain Management: Electronic Signature(s) Signed: 09/16/2021 4:00:27 PM By: Erenest Blank Signed: 09/28/2021 4:45:01 PM By: Rhae Hammock RN Entered By: Erenest Blank on 09/16/2021 15:06:05 -------------------------------------------------------------------------------- Patient/Caregiver Education Details Patient Name: Date of Service: Russell Collier, Russell Collier 7/26/2023andnbsp3:15 PM Medical Record Number: [id number] Patient Account Number: [id number] Date of Birth/Gender: Treating RN: [birth year] (61 y.o. Erie Noe Primary Care Physician: Alen Bleacher Other Clinician: Referring Physician: Treating Physician/Extender: Suzzette Righter in Treatment: 5 Education Assessment Education Provided To: Patient Education Topics Provided Wound/Skin Impairment: Methods: Explain/Verbal Responses: State content correctly Electronic Signature(s) Signed: 09/16/2021  4:00:27 PM By: Erenest Blank Entered By: Erenest Blank on 09/16/2021 15:28:23 -------------------------------------------------------------------------------- Wound Assessment Details Patient Name: Date of Service: Russell Collier 09/16/2021 3:15 PM Medical Record Number:  [id number] Patient Account Number: [id number] Date of Birth/Sex: Treating RN: 12-03-1960 (61 y.o. Mare Ferrari Primary Care Maximo Spratling: Alen Bleacher Other Clinician: Referring Bartt Gonzaga: Treating Guss Farruggia/Extender: Suzzette Righter in Treatment: 5 Wound Status Wound Number: 2 Primary Arterial Insufficiency Ulcer Etiology: Wound Location: Left, Anterior Lower Leg Wound Open Wounding Event: Trauma Status: Date Acquired: 08/03/2021 Comorbid Hemophilia, Asthma, Sleep Apnea, Angina, Congestive Heart Weeks Of Treatment: 5 History: Failure, Coronary Artery Disease, Hypertension, Peripheral Arterial Clustered Wound: Yes Disease, Type II Diabetes, Osteoarthritis, Neuropathy Photos Wound Measurements Length: (cm) 1.3 Width: (cm) 1.5 Depth: (cm) 0.2 Clustered Quantity: 2 Area: (cm) 1.532 Volume: (cm) 0.306 % Reduction in Area: 13.3% % Reduction in Volume: 13.3% Epithelialization: Large (67-100%) Tunneling: No Undermining: No Wound Description Classification: Full Thickness Without Exposed Support Structures Wound Margin: Distinct, outline attached Exudate Amount: Large Exudate Type: Serous Exudate Color: amber Foul Odor After Cleansing: No Slough/Fibrino Yes Wound Bed Granulation Amount: Medium (34-66%) Exposed Structure Granulation Quality: Red Fascia Exposed: No Necrotic Amount: Small (1-33%) Fat Layer (Subcutaneous Tissue) Exposed: Yes Necrotic Quality: Adherent Slough Tendon Exposed: No Muscle Exposed: No Joint Exposed: No Bone Exposed: No Electronic Signature(s) Signed: 09/16/2021 4:47:33 PM By: Sharyn Creamer RN, BSN Entered By: Sharyn Creamer on 09/16/2021 15:23:32 -------------------------------------------------------------------------------- [place] Details Patient Name: Date of Service: Russell Collier, Russell Collier 09/16/2021 3:15 PM Medical Record Number: [id number] Patient Account Number: [id number] Date of Birth/Sex: Treating RN: 24-Jun-1960 (61  y.o. [name], [name] Primary Care Myranda Pavone: Alen Bleacher Other Clinician: Referring Marcedes Tech: Treating Areg Bialas/Extender: Suzzette Righter in Treatment: 5 Vital Signs Time Taken: 15:06 Temperature (F): 98.9 Height (in): 70 Pulse (bpm): 91 Weight (lbs): 315 Respiratory Rate (breaths/min): 18 Body Mass Index (BMI): 45.2 Blood Pressure (mmHg): 129/72 Reference Range: 80 - 120 mg / dl Electronic Signature(s) Signed: 09/16/2021 4:00:27 PM By: Erenest Blank Entered By: Erenest Blank on 09/16/2021 15:07:50

## 2021-09-29 DIAGNOSIS — M5136 Other intervertebral disc degeneration, lumbar region: Secondary | ICD-10-CM | POA: Diagnosis not present

## 2021-09-30 DIAGNOSIS — M5136 Other intervertebral disc degeneration, lumbar region: Secondary | ICD-10-CM | POA: Diagnosis not present

## 2021-10-01 ENCOUNTER — Encounter (HOSPITAL_BASED_OUTPATIENT_CLINIC_OR_DEPARTMENT_OTHER): Payer: Medicaid Other | Attending: Internal Medicine | Admitting: Internal Medicine

## 2021-10-01 DIAGNOSIS — G4733 Obstructive sleep apnea (adult) (pediatric): Secondary | ICD-10-CM | POA: Insufficient documentation

## 2021-10-01 DIAGNOSIS — I872 Venous insufficiency (chronic) (peripheral): Secondary | ICD-10-CM | POA: Diagnosis not present

## 2021-10-01 DIAGNOSIS — D66 Hereditary factor VIII deficiency: Secondary | ICD-10-CM | POA: Diagnosis not present

## 2021-10-01 DIAGNOSIS — J441 Chronic obstructive pulmonary disease with (acute) exacerbation: Secondary | ICD-10-CM | POA: Insufficient documentation

## 2021-10-01 DIAGNOSIS — F17290 Nicotine dependence, other tobacco product, uncomplicated: Secondary | ICD-10-CM | POA: Insufficient documentation

## 2021-10-01 DIAGNOSIS — E11622 Type 2 diabetes mellitus with other skin ulcer: Secondary | ICD-10-CM | POA: Insufficient documentation

## 2021-10-01 DIAGNOSIS — I2510 Atherosclerotic heart disease of native coronary artery without angina pectoris: Secondary | ICD-10-CM | POA: Insufficient documentation

## 2021-10-01 DIAGNOSIS — I87333 Chronic venous hypertension (idiopathic) with ulcer and inflammation of bilateral lower extremity: Secondary | ICD-10-CM | POA: Insufficient documentation

## 2021-10-01 DIAGNOSIS — I110 Hypertensive heart disease with heart failure: Secondary | ICD-10-CM | POA: Diagnosis not present

## 2021-10-01 DIAGNOSIS — I509 Heart failure, unspecified: Secondary | ICD-10-CM | POA: Insufficient documentation

## 2021-10-01 DIAGNOSIS — M5136 Other intervertebral disc degeneration, lumbar region: Secondary | ICD-10-CM | POA: Diagnosis not present

## 2021-10-01 DIAGNOSIS — L97822 Non-pressure chronic ulcer of other part of left lower leg with fat layer exposed: Secondary | ICD-10-CM | POA: Diagnosis not present

## 2021-10-01 NOTE — Progress Notes (Addendum)
LAURIN, MORGENSTERN ([id number]) Visit Report for 10/01/2021 Arrival Information Details Patient Name: Date of Service: Dwan Bolt 10/01/2021 3:15 PM Medical Record Number: [id number] Patient Account Number: [id number] Date of Birth/Sex: Treating RN: April 25, 1960 (61 y.o. [name], [name] Primary Care Thao Bauza: Alen Bleacher Other Clinician: Referring Antinio Sanderfer: Treating Austin Herd/Extender: Jannet Askew in Treatment: 7 Visit Information History Since Last Visit Added or deleted any medications: No Patient Arrived: [name] Any new allergies or adverse reactions: No Arrival Time: 15:48 Had a fall or experienced change in No Accompanied By: self activities of daily living that may affect Transfer Assistance: None risk of falls: Patient Identification Verified: Yes Signs or symptoms of abuse/neglect since last visito No Secondary Verification Process Completed: Yes Hospitalized since last visit: No Patient Requires Transmission-Based Precautions: No Implantable device outside of the clinic excluding No Patient Has Alerts: Yes cellular tissue based products placed in the center Patient Alerts: Patient on Blood Thinner since last visit: Has Dressing in Place as Prescribed: Yes Has Compression in Place as Prescribed: Yes Pain Present Now: No Electronic Signature(s) Signed: 10/01/2021 5:01:19 PM By: Rhae Hammock RN Entered By: Rhae Hammock on 10/01/2021 15:49:07 -------------------------------------------------------------------------------- Compression Therapy Details Patient Name: Date of Service: [place], [name] 10/01/2021 3:15 PM Medical Record Number: [id number] Patient Account Number: [id number] Date of Birth/Sex: Treating RN: 07-Apr-1960 (61 y.o. Erie Noe Primary Care Allyn Bartelson: Alen Bleacher Other Clinician: Referring Kathelyn Gombos: Treating Cherity Blickenstaff/Extender: Jannet Askew in Treatment: 7 Compression Therapy  Performed for Wound Assessment: Wound #2 Left,Anterior Lower Leg Performed By: Clinician Rhae Hammock, RN Compression Type: Three Layer Post Procedure Diagnosis Same as Pre-procedure Electronic Signature(s) Signed: 10/01/2021 5:01:19 PM By: Rhae Hammock RN Entered By: Rhae Hammock on 10/01/2021 15:51:18 -------------------------------------------------------------------------------- Encounter Discharge Information Details Patient Name: Date of Service: [place], [name] 10/01/2021 3:15 PM Medical Record Number: [id number] Patient Account Number: [id number] Date of Birth/Sex: Treating RN: 03/14/1960 (61 y.o. [name], [name] Primary Care Raylin Diguglielmo: Alen Bleacher Other Clinician: Referring Audia Amick: Treating Zyere Jiminez/Extender: Jannet Askew in Treatment: 7 Encounter Discharge Information Items Post Procedure Vitals Discharge Condition: Stable Temperature (F): 98.7 Ambulatory Status: Walker Pulse (bpm): 74 Discharge Destination: Home Respiratory Rate (breaths/min): 17 Transportation: Private Auto Blood Pressure (mmHg): 120/80 Accompanied By: self Schedule Follow-up Appointment: Yes Clinical Summary of Care: Patient Declined Electronic Signature(s) Signed: 10/01/2021 5:01:19 PM By: Rhae Hammock RN Entered By: Rhae Hammock on 10/01/2021 16:57:04 -------------------------------------------------------------------------------- Lower Extremity Assessment Details Patient Name: Date of Service: Lise Auer BERT 10/01/2021 3:15 PM Medical Record Number: [id number] Patient Account Number: [id number] Date of Birth/Sex: Treating RN: Mar 23, 1960 (61 y.o. [name], [name] Primary Care Moncerrath Berhe: Alen Bleacher Other Clinician: Referring Claudeen Leason: Treating Katharina Jehle/Extender: Jannet Askew in Treatment: 7 Edema Assessment Assessed: [name]: No] [name]: No] Edema: [Left: Ye] [Right: s] Calf Left: Right: Point of  Measurement: 37 cm From Medial Instep 33.5 cm Ankle Left: Right: Point of Measurement: 10 cm From Medial Instep 44.5 cm Vascular Assessment Pulses: Dorsalis Pedis Palpable: [Left:Yes] Doppler Audible: [Left:Yes] Posterior Tibial Palpable: [Left:Yes] Electronic Signature(s) Signed: 10/01/2021 4:44:02 PM By: Erenest Blank Signed: 10/01/2021 5:01:19 PM By: Rhae Hammock RN Entered By: Erenest Blank on 10/01/2021 15:40:44 -------------------------------------------------------------------------------- Multi Wound Chart Details Patient Name: Date of Service: [name], [name] 10/01/2021 3:15 PM Medical Record Number: [id number] Patient Account Number: [id number] Date of Birth/Sex: Treating RN: September 21, 1960 (61 y.o. Erie Noe Primary Care Jaquetta Currier: Alen Bleacher Other Clinician: Referring Alese Furniss:  Treating Suhaan Perleberg/Extender: Jannet Askew in Treatment: 7 Vital Signs Height(in): 70 Pulse(bpm): 61 Weight(lbs): 315 Blood Pressure(mmHg): 120/80 Body Mass Index(BMI): 45.2 Temperature(F): 98.7 Respiratory Rate(breaths/min): 17 Photos: [N/A:N/A] Left, Anterior Lower Leg N/A N/A Wound Location: Trauma N/A N/A Wounding Event: Arterial Insufficiency Ulcer N/A N/A Primary Etiology: Hemophilia, Asthma, Sleep Apnea, N/A N/A Comorbid History: Angina, Congestive Heart Failure, Coronary Artery Disease, Hypertension, Peripheral Arterial Disease, Type II Diabetes, Osteoarthritis, Neuropathy 08/03/2021 N/A N/A Date Acquired: 7 N/A N/A Weeks of Treatment: Open N/A N/A Wound Status: No N/A N/A Wound Recurrence: Yes N/A N/A Clustered Wound: 1 N/A N/A Clustered Quantity: 0.9x0.5x0.3 N/A N/A Measurements L x W x D (cm) 0.353 N/A N/A A (cm) : rea 0.106 N/A N/A Volume (cm) : 80.00% N/A N/A % Reduction in A rea: 70.00% N/A N/A % Reduction in Volume: Full Thickness Without Exposed N/A N/A Classification: Support Structures Large N/A  N/A Exudate A mount: Serous N/A N/A Exudate Type: amber N/A N/A Exudate Color: Distinct, outline attached N/A N/A Wound Margin: Large (67-100%) N/A N/A Granulation A mount: Red N/A N/A Granulation Quality: Small (1-33%) N/A N/A Necrotic A mount: Fat Layer (Subcutaneous Tissue): Yes N/A N/A Exposed Structures: Fascia: No Tendon: No Muscle: No Joint: No Bone: No Large (67-100%) N/A N/A Epithelialization: Debridement - Excisional N/A N/A Debridement: Pre-procedure Verification/Time Out 16:12 N/A N/A Taken: Lidocaine N/A N/A Pain Control: Subcutaneous, Slough N/A N/A Tissue Debrided: Skin/Subcutaneous Tissue N/A N/A Level: 0.45 N/A N/A Debridement A (sq cm): rea Curette N/A N/A Instrument: Minimum N/A N/A Bleeding: Pressure N/A N/A Hemostasis A chieved: 0 N/A N/A Procedural Pain: 0 N/A N/A Post Procedural Pain: Procedure was tolerated well N/A N/A Debridement Treatment Response: 0.9x0.5x0.3 N/A N/A Post Debridement Measurements L x W x D (cm) 0.106 N/A N/A Post Debridement Volume: (cm) Compression Therapy N/A N/A Procedures Performed: Debridement Treatment Notes Wound #2 (Lower Leg) Wound Laterality: Left, Anterior Cleanser Soap and Water Discharge Instruction: May shower and wash wound with dial antibacterial soap and water prior to dressing change. Wound Cleanser Discharge Instruction: Cleanse the wound with wound cleanser prior to applying a clean dressing using gauze sponges, not tissue or cotton balls. Peri-Wound Care Triamcinolone 15 (g) Discharge Instruction: Use triamcinolone 15 (g) as directed Sween Lotion (Moisturizing lotion) Discharge Instruction: Apply moisturizing lotion as directed Topical Triamcinolone Discharge Instruction: Apply Triamcinolone as directed Primary Dressing Promogran Prisma Matrix, 4.34 (sq in) (silver collagen) Discharge Instruction: Moisten collagen with saline or hydrogel Secondary Dressing ABD Pad,  8x10 Discharge Instruction: Apply over primary dressing as directed. Woven Gauze Sponge, Non-Sterile 4x4 in Discharge Instruction: Apply over primary dressing as directed. Secured With Compression Wrap ThreePress (3 layer compression wrap) Discharge Instruction: Apply three layer compression as directed. Compression Stockings Add-Ons Electronic Signature(s) Signed: 10/02/2021 1:41:40 PM By: Kalman Shan DO Signed: 10/05/2021 4:24:40 PM By: Rhae Hammock RN Entered By: Kalman Shan on 10/02/2021 13:30:51 -------------------------------------------------------------------------------- Multi-Disciplinary Care Plan Details Patient Name: Date of Service: [name], [name] 10/01/2021 3:15 PM Medical Record Number: [id number] Patient Account Number: [id number] Date of Birth/Sex: Treating RN: 09-04-1960 (61 y.o. [name], [name] Primary Care Hartley Wyke: Alen Bleacher Other Clinician: Referring Jeroline Wolbert: Treating Maigan Bittinger/Extender: Jannet Askew in Treatment: 7 Active Inactive Wound/Skin Impairment Nursing Diagnoses: Impaired tissue integrity Goals: Patient/caregiver will verbalize understanding of skin care regimen Date Initiated: 08/12/2021 Date Inactivated: 09/16/2021 Target Resolution Date: 09/09/2021 Goal Status: Met Ulcer/skin breakdown will have a volume reduction of 30% by week 4 Date Initiated: 08/12/2021 Target Resolution  Date: 10/24/2021 Goal Status: Active Interventions: Assess patient/caregiver ability to obtain necessary supplies Assess patient/caregiver ability to perform ulcer/skin care regimen upon admission and as needed Assess ulceration(s) every visit Provide education on smoking Provide education on ulcer and skin care Treatment Activities: Smoking cessation education : 08/12/2021 Topical wound management initiated : 08/12/2021 Notes: Electronic Signature(s) Signed: 10/01/2021 5:01:19 PM By: Rhae Hammock RN Entered By:  Rhae Hammock on 10/01/2021 15:49:47 -------------------------------------------------------------------------------- Pain Assessment Details Patient Name: Date of Service: [place], Enoch 10/01/2021 3:15 PM Medical Record Number: [id number] Patient Account Number: [id number] Date of Birth/Sex: Treating RN: 01-Oct-1960 (61 y.o. [name], [name] Primary Care Markeshia Giebel: Alen Bleacher Other Clinician: Referring Keiarra Charon: Treating Bellina Tokarczyk/Extender: Jannet Askew in Treatment: 7 Active Problems Location of Pain Severity and Description of Pain Patient Has Paino No Site Locations Pain Management and Medication Current Pain Management: Electronic Signature(s) Signed: 10/01/2021 5:01:19 PM By: Rhae Hammock RN Entered By: Rhae Hammock on 10/01/2021 15:49:30 -------------------------------------------------------------------------------- Patient/Caregiver Education Details Patient Name: Date of Service: [name], [name] 8/10/2023andnbsp3:15 PM Medical Record Number: [id number] Patient Account Number: [id number] Date of Birth/Gender: Treating RN: 07-06-1960 (61 y.o. Erie Noe Primary Care Physician: Alen Bleacher Other Clinician: Referring Physician: Treating Physician/Extender: Jannet Askew in Treatment: 7 Education Assessment Education Provided To: Patient Education Topics Provided Wound/Skin Impairment: Methods: Explain/Verbal Responses: Reinforcements needed, State content correctly Electronic Signature(s) Signed: 10/01/2021 5:01:19 PM By: Rhae Hammock RN Entered By: Rhae Hammock on 10/01/2021 15:50:07 -------------------------------------------------------------------------------- Wound Assessment Details Patient Name: Date of Service: [place], [name] 10/01/2021 3:15 PM Medical Record Number: [id number] Patient Account Number: [id number] Date of Birth/Sex: Treating RN: 1960-08-16 (61  y.o. [name], [name] Primary Care Meghin Thivierge: Alen Bleacher Other Clinician: Referring Elicia Lui: Treating Lily Kernen/Extender: Jannet Askew in Treatment: 7 Wound Status Wound Number: 2 Primary Arterial Insufficiency Ulcer Etiology: Wound Location: Left, Anterior Lower Leg Wound Open Wounding Event: Trauma Status: Date Acquired: 08/03/2021 Comorbid Hemophilia, Asthma, Sleep Apnea, Angina, Congestive Heart Weeks Of Treatment: 7 History: Failure, Coronary Artery Disease, Hypertension, Peripheral Arterial Clustered Wound: Yes Disease, Type II Diabetes, Osteoarthritis, Neuropathy Photos Wound Measurements Length: (cm) 0.9 Width: (cm) 0.5 Depth: (cm) 0.3 Clustered Quantity: 1 Area: (cm) 0. Volume: (cm) 0. % Reduction in Area: 80% % Reduction in Volume: 70% Epithelialization: Large (67-100%) Tunneling: No 353 Undermining: No 106 Wound Description Classification: Full Thickness Without Exposed Support Structures Wound Margin: Distinct, outline attached Exudate Amount: Large Exudate Type: Serous Exudate Color: amber Foul Odor After Cleansing: No Slough/Fibrino Yes Wound Bed Granulation Amount: Large (67-100%) Exposed Structure Granulation Quality: Red Fascia Exposed: No Necrotic Amount: Small (1-33%) Fat Layer (Subcutaneous Tissue) Exposed: Yes Necrotic Quality: Adherent Slough Tendon Exposed: No Muscle Exposed: No Joint Exposed: No Bone Exposed: No Electronic Signature(s) Signed: 10/01/2021 5:01:19 PM By: Rhae Hammock RN Signed: 10/01/2021 5:06:20 PM By: Deon Pilling RN, BSN Entered By: Deon Pilling on 10/01/2021 15:47:06 -------------------------------------------------------------------------------- Vitals Details Patient Name: Date of Service: [name], [name] 10/01/2021 3:15 PM Medical Record Number: [id number] Patient Account Number: [id number] Date of Birth/Sex: Treating RN: 1960/08/13 (61 y.o. [name], [name] Primary  Care Yasmina [place]: Alen Bleacher Other Clinician: Referring Xylan Sheils: Treating Angy Swearengin/Extender: Jannet Askew in Treatment: 7 Vital Signs Time Taken: 15:49 Temperature (F): 98.7 Height (in): 70 Pulse (bpm): 74 Weight (lbs): 315 Respiratory Rate (breaths/min): 17 Body Mass Index (BMI): 45.2 Blood Pressure (mmHg): 120/80 Reference Range: 80 - 120 mg / dl Electronic Signature(s) Signed:  10/01/2021 5:01:19 PM By: Rhae Hammock RN Entered By: Rhae Hammock on 10/01/2021 15:49:24

## 2021-10-02 DIAGNOSIS — M5136 Other intervertebral disc degeneration, lumbar region: Secondary | ICD-10-CM | POA: Diagnosis not present

## 2021-10-05 DIAGNOSIS — M5136 Other intervertebral disc degeneration, lumbar region: Secondary | ICD-10-CM | POA: Diagnosis not present

## 2021-10-06 ENCOUNTER — Encounter (HOSPITAL_BASED_OUTPATIENT_CLINIC_OR_DEPARTMENT_OTHER): Payer: Medicaid Other | Admitting: Internal Medicine

## 2021-10-06 DIAGNOSIS — L97822 Non-pressure chronic ulcer of other part of left lower leg with fat layer exposed: Secondary | ICD-10-CM

## 2021-10-06 DIAGNOSIS — M5136 Other intervertebral disc degeneration, lumbar region: Secondary | ICD-10-CM | POA: Diagnosis not present

## 2021-10-06 DIAGNOSIS — I87333 Chronic venous hypertension (idiopathic) with ulcer and inflammation of bilateral lower extremity: Secondary | ICD-10-CM | POA: Diagnosis not present

## 2021-10-07 ENCOUNTER — Telehealth: Payer: Self-pay | Admitting: Pharmacist

## 2021-10-07 DIAGNOSIS — M5136 Other intervertebral disc degeneration, lumbar region: Secondary | ICD-10-CM | POA: Diagnosis not present

## 2021-10-07 NOTE — Progress Notes (Addendum)
JAICOB, DIA ([id number]) Visit Report for 10/06/2021 Arrival Information Details Patient Name: Date of Service: Russell Collier 10/06/2021 3:30 PM Medical Record Number: [id number] Patient Account Number: [id number] Date of Birth/Sex: Treating RN: January 30, 1961 (61 y.o. [name] Primary Care Tevion Laforge: Alen Bleacher Other Clinician: Referring Derek Laughter: Treating Fallan Mccarey/Extender: Jannet Askew in Treatment: 7 Visit Information History Since Last Visit Added or deleted any medications: No Patient Arrived: Kasandra Knudsen Any new allergies or adverse reactions: No Arrival Time: 15:42 Had a fall or experienced change in No Accompanied By: self activities of daily living that may affect Transfer Assistance: None risk of falls: Patient Identification Verified: Yes Signs or symptoms of abuse/neglect since last visito No Secondary Verification Process Completed: Yes Hospitalized since last visit: No Patient Requires Transmission-Based Precautions: No Implantable device outside of the clinic excluding No Patient Has Alerts: Yes cellular tissue based products placed in the center Patient Alerts: Patient on Blood Thinner since last visit: Has Dressing in Place as Prescribed: Yes Has Compression in Place as Prescribed: Yes Pain Present Now: No Electronic Signature(s) Signed: 10/06/2021 4:48:52 PM By: Erenest Blank Entered By: Erenest Blank on 10/06/2021 15:42:41 -------------------------------------------------------------------------------- Compression Therapy Details Patient Name: Date of Service: Russell Collier 10/06/2021 3:30 PM Medical Record Number: [id number] Patient Account Number: [id number] Date of Birth/Sex: Treating RN: 30-May-1960 (61 y.o. [name] Primary Care Richie Vadala: Alen Bleacher Other Clinician: Referring Arben Packman: Treating Nahal Wanless/Extender: Jannet Askew in Treatment: 7 Compression Therapy Performed for Wound  Assessment: Wound #2 Left,Anterior Lower Leg Performed By: Clinician Lorrin Jackson, RN Compression Type: Three Layer Post Procedure Diagnosis Same as Pre-procedure Electronic Signature(s) Signed: 10/06/2021 5:00:27 PM By: Lorrin Jackson Entered By: Lorrin Jackson on 10/06/2021 16:13:12 -------------------------------------------------------------------------------- Encounter Discharge Information Details Patient Name: Date of Service: Russell Collier, Russell Collier 10/06/2021 3:30 PM Medical Record Number: [id number] Patient Account Number: [id number] Date of Birth/Sex: Treating RN: 1961/02/21 (61 y.o. [name] Primary Care Neftaly Swiss: Alen Bleacher Other Clinician: Referring Magin Balbi: Treating Jiovanna Frei/Extender: Jannet Askew in Treatment: 7 Encounter Discharge Information Items Post Procedure Vitals Discharge Condition: Stable Temperature (F): 98.8 Ambulatory Status: Cane Pulse (bpm): 85 Discharge Destination: Home Respiratory Rate (breaths/min): 18 Transportation: Private Auto Blood Pressure (mmHg): 115/66 Schedule Follow-up Appointment: Yes Clinical Summary of Care: Provided on 10/06/2021 Form Type Recipient Paper Patient Patient Electronic Signature(s) Signed: 10/06/2021 5:00:27 PM By: Lorrin Jackson Entered By: Lorrin Jackson on 10/06/2021 16:29:42 -------------------------------------------------------------------------------- Lower Extremity Assessment Details Patient Name: Date of Service: Russell Collier 10/06/2021 3:30 PM Medical Record Number: [id number] Patient Account Number: [id number] Date of Birth/Sex: Treating RN: Jan 10, 1961 (61 y.o. [name] Primary Care Edwing Figley: Alen Bleacher Other Clinician: Referring Kewana Sanon: Treating Zayda Angell/Extender: Jannet Askew in Treatment: 7 Edema Assessment Assessed: [name]: No] [Right: No] Edema: [Left: Ye] [Right: s] Calf Left: Right: Point of Measurement: 37 cm  From Medial Instep 41 cm Ankle Left: Right: Point of Measurement: 10 cm From Medial Instep 23 cm Electronic Signature(s) Signed: 10/06/2021 4:48:52 PM By: Erenest Blank Signed: 10/06/2021 5:00:27 PM By: Lorrin Jackson Entered By: Erenest Blank on 10/06/2021 15:57:05 -------------------------------------------------------------------------------- Multi Wound Chart Details Patient Name: Date of Service: Russell Collier, Russell Collier 10/06/2021 3:30 PM Medical Record Number: [id number] Patient Account Number: [id number] Date of Birth/Sex: Treating RN: Jul 04, 1960 (61 y.o. [name] Primary Care Viktor Philipp: Alen Bleacher Other Clinician: Referring Alice Burnside: Treating Mustafa Potts/Extender: Jannet Askew in Treatment: 7 Vital Signs Height(in): 23  Pulse(bpm): 85 Weight(lbs): 315 Blood Pressure(mmHg): 115/66 Body Mass Index(BMI): 45.2 Temperature(F): 98.8 Respiratory Rate(breaths/min): 18 Photos: [N/A:N/A] Left, Anterior Lower Leg N/A N/A Wound Location: Trauma N/A N/A Wounding Event: Arterial Insufficiency Ulcer N/A N/A Primary Etiology: Hemophilia, Asthma, Sleep Apnea, N/A N/A Comorbid History: Angina, Congestive Heart Failure, Coronary Artery Disease, Hypertension, Peripheral Arterial Disease, Type II Diabetes, Osteoarthritis, Neuropathy 08/03/2021 N/A N/A Date Acquired: 7 N/A N/A Weeks of Treatment: Open N/A N/A Wound Status: No N/A N/A Wound Recurrence: Yes N/A N/A Clustered Wound: 1 N/A N/A Clustered Quantity: 1x0.6x0.1 N/A N/A Measurements L x W x D (cm) 0.471 N/A N/A A (cm) : rea 0.047 N/A N/A Volume (cm) : 73.30% N/A N/A % Reduction in A rea: 86.70% N/A N/A % Reduction in Volume: Full Thickness Without Exposed N/A N/A Classification: Support Structures Large N/A N/A Exudate A mount: Serous N/A N/A Exudate Type: amber N/A N/A Exudate Color: Distinct, outline attached N/A N/A Wound Margin: Small (1-33%) N/A N/A Granulation A  mount: Red N/A N/A Granulation Quality: Large (67-100%) N/A N/A Necrotic A mount: Fat Layer (Subcutaneous Tissue): Yes N/A N/A Exposed Structures: Fascia: No Tendon: No Muscle: No Joint: No Bone: No Large (67-100%) N/A N/A Epithelialization: Debridement - Excisional N/A N/A Debridement: Pre-procedure Verification/Time Out 16:09 N/A N/A Taken: Lidocaine 4% Topical Solution N/A N/A Pain Control: Subcutaneous, Slough N/A N/A Tissue Debrided: Skin/Subcutaneous Tissue N/A N/A Level: 0.6 N/A N/A Debridement A (sq cm): rea Curette N/A N/A Instrument: Minimum N/A N/A Bleeding: Pressure N/A N/A Hemostasis A chieved: Procedure was tolerated well N/A N/A Debridement Treatment Response: 1x0.6x0.1 N/A N/A Post Debridement Measurements L x W x D (cm) 0.047 N/A N/A Post Debridement Volume: (cm) Compression Therapy N/A N/A Procedures Performed: Debridement Treatment Notes Electronic Signature(s) Signed: 10/06/2021 4:20:15 PM By: Kalman Shan DO Signed: 10/06/2021 5:00:27 PM By: Lorrin Jackson Entered By: Kalman Shan on 10/06/2021 16:17:58 -------------------------------------------------------------------------------- Multi-Disciplinary Care Plan Details Patient Name: Date of Service: Russell Collier, [place] Collier 10/06/2021 3:30 PM Medical Record Number: [id number] Patient Account Number: [id number] Date of Birth/Sex: Treating RN: 07/17/1960 (61 y.o. [name] Primary Care Provider: Alen Bleacher Other Clinician: Referring Provider: Treating Provider/Extender: Jannet Askew in Treatment: 7 Active Inactive Wound/Skin Impairment Nursing Diagnoses: Impaired tissue integrity Goals: Patient/caregiver will verbalize understanding of skin care regimen Date Initiated: 08/12/2021 Date Inactivated: 09/16/2021 Target Resolution Date: 09/09/2021 Goal Status: Met Ulcer/skin breakdown will have a volume reduction of 30% by week 4 Date Initiated:  08/12/2021 Target Resolution Date: 10/24/2021 Goal Status: Active Interventions: Assess patient/caregiver ability to obtain necessary supplies Assess patient/caregiver ability to perform ulcer/skin care regimen upon admission and as needed Assess ulceration(s) every visit Provide education on smoking Provide education on ulcer and skin care Treatment Activities: Smoking cessation education : 08/12/2021 Topical wound management initiated : 08/12/2021 Notes: Electronic Signature(s) Signed: 10/06/2021 3:21:11 PM By: Lorrin Jackson Entered By: Lorrin Jackson on 10/06/2021 15:21:10 -------------------------------------------------------------------------------- Pain Assessment Details Patient Name: Date of Service: Russell Collier 10/06/2021 3:30 PM Medical Record Number: [id number] Patient Account Number: [id number] Date of Birth/Sex: Treating RN: 10-14-1960 (61 y.o. [name] Primary Care Provider: Alen Bleacher Other Clinician: Referring Provider: Treating Provider/Extender: Jannet Askew in Treatment: 7 Active Problems Location of Pain Severity and Description of Pain Patient Has Paino No Site Locations Duration of the Pain. Constant / Intermittento Intermittent Rate the pain. Current Pain Level: 3 Pain Management and Medication Current Pain Management: Electronic Signature(s) Signed: 10/06/2021 4:48:52 PM By: Erenest Blank  Signed: 10/06/2021 5:00:27 PM By: Fara Chute By: Erenest Blank on 10/06/2021 15:43:27 -------------------------------------------------------------------------------- Patient/Caregiver Education Details Patient Name: Date of Service: Russell Collier, [place] Collier 8/15/2023andnbsp3:30 PM Medical Record Number: [id number] Patient Account Number: [id number] Date of Birth/Gender: Treating RN: November 03, 1960 (61 y.o. [name] Primary Care Physician: Alen Bleacher Other Clinician: Referring Physician: Treating  Physician/Extender: Jannet Askew in Treatment: 7 Education Assessment Education Provided To: Patient Education Topics Provided Smoking and Wound Healing: Methods: Explain/Verbal, Printed Responses: State content correctly Venous: Methods: Explain/Verbal, Printed Responses: State content correctly Wound/Skin Impairment: Methods: Explain/Verbal, Printed Responses: State content correctly Electronic Signature(s) Signed: 10/06/2021 5:00:27 PM By: Lorrin Jackson Entered By: Lorrin Jackson on 10/06/2021 15:21:37 -------------------------------------------------------------------------------- Wound Assessment Details Patient Name: Date of Service: Russell Collier, Russell Collier 10/06/2021 3:30 PM Medical Record Number: [id number] Patient Account Number: [id number] Date of Birth/Sex: Treating RN: 11-13-1960 (61 y.o. [name] Primary Care Provider: Alen Bleacher Other Clinician: Referring Provider: Treating Provider/Extender: Jannet Askew in Treatment: 7 Wound Status Wound Number: 2 Primary Arterial Insufficiency Ulcer Etiology: Wound Location: Left, Anterior Lower Leg Wound Open Wounding Event: Trauma Status: Date Acquired: 08/03/2021 Comorbid Hemophilia, Asthma, Sleep Apnea, Angina, Congestive Heart Weeks Of Treatment: 7 History: Failure, Coronary Artery Disease, Hypertension, Peripheral Arterial Clustered Wound: Yes Disease, Type II Diabetes, Osteoarthritis, Neuropathy Photos Wound Measurements Length: (cm) 1 Width: (cm) 0.6 Depth: (cm) 0.1 Clustered Quantity: 1 Area: (cm) 0.471 Volume: (cm) 0.047 % Reduction in Area: 73.3% % Reduction in Volume: 86.7% Epithelialization: Large (67-100%) Tunneling: No Undermining: No Wound Description Classification: Full Thickness Without Exposed Support Structures Wound Margin: Distinct, outline attached Exudate Amount: Large Exudate Type: Serous Exudate Color: amber Foul Odor After  Cleansing: No Slough/Fibrino Yes Wound Bed Granulation Amount: Small (1-33%) Exposed Structure Granulation Quality: Red Fascia Exposed: No Necrotic Amount: Large (67-100%) Fat Layer (Subcutaneous Tissue) Exposed: Yes Necrotic Quality: Adherent Slough Tendon Exposed: No Muscle Exposed: No Joint Exposed: No Bone Exposed: No Treatment Notes Wound #2 (Lower Leg) Wound Laterality: Left, Anterior Cleanser Soap and Water Discharge Instruction: May shower and wash wound with dial antibacterial soap and water prior to dressing change. Wound Cleanser Discharge Instruction: Cleanse the wound with wound cleanser prior to applying a clean dressing using gauze sponges, not tissue or cotton balls. Peri-Wound Care Triamcinolone 15 (g) Discharge Instruction: Use triamcinolone 15 (g) as directed Sween Lotion (Moisturizing lotion) Discharge Instruction: Apply moisturizing lotion as directed Topical Primary Dressing Promogran Prisma Matrix, 4.34 (sq in) (silver collagen) Discharge Instruction: Moisten collagen with saline or hydrogel Secondary Dressing ABD Pad, 8x10 Discharge Instruction: Apply over primary dressing as directed. Woven Gauze Sponge, Non-Sterile 4x4 in Discharge Instruction: Apply over primary dressing as directed. Secured With Compression Wrap ThreePress (3 layer compression wrap) Discharge Instruction: Apply three layer compression as directed. Compression Stockings Add-Ons Electronic Signature(s) Signed: 10/06/2021 4:48:52 PM By: Erenest Blank Signed: 10/06/2021 5:00:27 PM By: Lorrin Jackson Entered By: Erenest Blank on 10/06/2021 15:56:35 -------------------------------------------------------------------------------- Vitals Details Patient Name: Date of Service: Russell Collier, Russell Collier 10/06/2021 3:30 PM Medical Record Number: [id number] Patient Account Number: [id number] Date of Birth/Sex: Treating RN: February 12, 1961 (61 y.o. [name] Primary Care Provider: Alen Bleacher Other Clinician: Referring Provider: Treating Provider/Extender: Jannet Askew in Treatment: 7 Vital Signs Time Taken: 15:42 Temperature (F): 98.8 Height (in): 70 Pulse (bpm): 85 Weight (lbs): 315 Respiratory Rate (breaths/min): 18 Body Mass Index (BMI): 45.2 Blood Pressure (mmHg): 115/66 Reference Range: 80 - 120 mg /  dl Electronic Signature(s) Signed: 10/06/2021 4:48:52 PM By: Erenest Blank Entered By: Erenest Blank on 10/06/2021 15:43:10

## 2021-10-07 NOTE — Progress Notes (Signed)
ARMISTEAD, SULT ([id number]) Visit Report for 10/06/2021 Chief Complaint Document Details Patient Name: Date of Service: Russell Collier 10/06/2021 3:30 PM Medical Record Number: [id number] Patient Account Number: [id number] Date of Birth/Sex: Treating RN: [birth year] (61 y.o. Russell Collier Primary Care Provider: Alen Collier Other Clinician: Referring Provider: Treating Provider/Extender: Russell Collier in Treatment: 7 Information Obtained from: Patient Chief Complaint Left anterior LE Ulcer Electronic Signature(s) Signed: 10/06/2021 4:20:15 PM By: Russell Shan DO Entered By: Russell Collier on 10/06/2021 16:18:07 -------------------------------------------------------------------------------- Debridement Details Patient Name: Date of Service: Russell Collier 10/06/2021 3:30 PM Medical Record Number: [id number] Patient Account Number: [id number] Date of Birth/Sex: Treating RN: 26-Jul-1960 (61 y.o. Russell Collier Primary Care Provider: Alen Collier Other Clinician: Referring Provider: Treating Provider/Extender: Russell Collier in Treatment: 7 Debridement Performed for Assessment: Wound #2 Left,Anterior Lower Leg Performed By: Physician Russell Shan, DO Debridement Type: Debridement Severity of Tissue Pre Debridement: Fat layer exposed Level of Consciousness (Pre-procedure): Awake and Alert Pre-procedure Verification/Time Out Yes - 16:09 Taken: Start Time: 16:10 Pain Control: Lidocaine 4% T opical Solution T Area Debrided (L x W): otal 1 (cm) x 0.6 (cm) = 0.6 (cm) Tissue and other material debrided: Non-Viable, Slough, Subcutaneous, Slough Level: Skin/Subcutaneous Tissue Debridement Description: Excisional Instrument: Curette Bleeding: Minimum Hemostasis Achieved: Pressure End Time: 16:14 Response to Treatment: Procedure was tolerated well Level of Consciousness (Post- Awake and Alert procedure): Post  Debridement Measurements of Total Wound Length: (cm) 1 Width: (cm) 0.6 Depth: (cm) 0.1 Volume: (cm) 0.047 Character of Wound/Ulcer Post Debridement: Stable Severity of Tissue Post Debridement: Fat layer exposed Post Procedure Diagnosis Same as Pre-procedure Electronic Signature(s) Signed: 10/06/2021 4:20:15 PM By: Russell Shan DO Signed: 10/06/2021 5:00:27 PM By: Russell Collier Entered By: Russell Collier on 10/06/2021 16:14:30 -------------------------------------------------------------------------------- HPI Details Patient Name: Date of Service: Russell Collier, Russell Collier 10/06/2021 3:30 PM Medical Record Number: [id number] Patient Account Number: [id number] Date of Birth/Sex: Treating RN: [birth year] (61 y.o. Russell Collier Primary Care Provider: Alen Collier Other Clinician: Referring Provider: Treating Provider/Extender: Russell Collier in Treatment: 7 History of Present Illness HPI Description: ADMISSION 09/14/2018 This is a 61 year old man who traumatized his left anterior tibia area on the bathtub. He describes this initially as a bruise that subsequently reopened. He has been seen in the ER several times and has had at least 3 courses of antibiotics including doxy and Keflex twice. He has been using Neosporin and peroxide on this. Past medical history includes obstructive sleep apnea, cellulitis, hypertension, coronary artery disease, peripheral neuropathy [nondiabetic], currently on a heart murmur for atypical chest pain and bradycardia. 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 ARTERIAL STUDIES done in March 2020 showed an ABI on the left of 0.93, TBI on the left slightly reduced at 0.42. On the right his ABI was 0.95 with a TBI of 0.51 he has monophasic waveforms in the lower extremities bilaterally. Interpreted on the left this showing mild left lower extremity arterial disease as well as on the right. 09/25/18-Patient returns at 1 week to the clinic,  noted that arterial studies done in March 2020. We have been using Iodoflex to the wound on the left leg, patient has significant degree of pain and declines to have any debridement 8/21; the patient has not been here in 3 weeks. Since he was last here he was admitted to hospital on 8/4. He was felt to have stable angina. He has since been to his cardiologist. I  note that he has an EF of about 48%. He has a venous wound on the left anterior mid tibia area probably with lymphedema. We have been using Iodoflex. He does not tolerate debridement well. He tells me that his cardiologist has been adjusting his Lasix and then the wraps fall down he tries to pull them up. He is describing pain making it difficult for him to function. 9/3; patient is was hospitalized since he was last here with COPD exacerbation. Came in with Aquacel Ag on the wound. The wound actually looks improved. 9/17; patient was apparently in the ER again with breathing issues and blood pressure issues. He was not admitted. We are using Sorbact of the wound with nice improvement dimensions are better 10/1; the patient's wound is smaller and appears to have a reasonable healthy surface. We are using Sorbact 10/8; the wound is smaller although there is still an area with depth and necrotic debris at the base. We are using Sorbact 10/15; not much change in the wound area. We have been using Sorbact. 10/22; small wound area using endoform since last week 11/5; Russell Collier arrived today in a healed state. Left leg and wrapped. He still does not have compression stockings Readmission: 08-12-2021 upon evaluation today patient presents for evaluation here in the clinic he has been seen last in 2020 at that point Russell Collier was the treating physician. Subsequently the patient tells me that he does have a history of peripheral vascular disease, chronic venous insufficiency, diabetes mellitus type 2, and coronary artery disease. Unfortunately he  has had some issues with his right leg he recently has been [facility] and they did perform an arteriogram on the right leg I believe even putting a stent in. With that being said that leg seems to be doing well he mainly just needs a compression sock. In regard to the left leg which is the one in question he tells me the same day he had a surgery he hit the left leg on a piece of furniture on the anterior shin. This caused a area that seems to be a hematoma present at this point. With that being said the patient is having significant discomfort. He tells me. He has been taking ibuprofen which he should not be doing I discussed that with him today. Tylenol is really only should be taken being on the Eliquis. He also tells me that he has not really been put anything on other than a Band-Aid I really think he may benefit from some light compression I do not want to do anything too tight until we get a good review of his arterial flow as his ABIs are not good we got here in the office today was 0.75 on the left 0.65 on the right and to be honest he did not have palpable pulses that I could find. He does not appear to have had an arterial study since 2020 as noted above by Russell Collier. 08-19-2021 upon evaluation today patient appears to be doing okay in regard to his wound although he does appear to have some fluid buildup underneath this area. Again I am a little reluctant to try to open this up right now due to the fact that the patient does not appear to have good blood flow based on our initial inspection during the clinic evaluation today. Again he does see his vascular surgeon today. We did not have any palpable pulses that we could find last  time I saw him and overall I think that he probably just needs to be seen in order to evaluate and see where things stand. 08-26-2021 upon evaluation today patient appears to be doing well currently in regard to his wound. Has been  tolerating the dressing changes without complication. Fortunately we do not have a big open area but he does have some fluid underneath in the center this looks like it might start draining on its own soon which would be ideal. With that being said right now he has been using the Curlex and Coban wrap which she does tolerate okay. He is going for an arteriogram on 14 July. After that if we need to open this we should be able to presuming they get his blood flow going appropriately. 09-02-2021 upon evaluation today patient appears to be doing about the same in regard to his wound this is starting to drain a lot more so it is definitely opening into something a little bit deeper. With that being said I do see signs of improvement to some degree and that there is not as much fluid buildup down in the base of the wound. I am very pleased with that. He is supposed to be having his vascular intervention on Monday after that hopefully will have better blood flow we will try to get this open to be getting draining completely and hopefully get this to resolve. He is not really wearing the compression right now because of discomfort that may again also be partially related to the fact that leg does not have sufficient blood flow to be able and heal this appropriately. Again once we get good blood flow hopefully the pain will not be as much we will get better compression on him as well. 09-09-2021 patient underwent the balloon angioplasty of the left peroneal artery on 09-07-2021. It sounds like that the area of stenosis was resolved based on what I am seeing at this point in time. It looks like the Dr. Gwenlyn Saran was satisfied with the blood flow postprocedure which was awesome. The patient had no issues with complication during or following the procedure. He is actually present for evaluation here in the clinic today and the good news is the wound is already looking much better and has only been a couple of days.  Fortunately he is not having a terrible amount of pain though is still having some discomfort at this time. I definitely think this is something that we can manage quite nicely. 7/26; the patient underwent a balloon angioplasty of the left peroneal artery on 09/07/2021 he tolerated this well he has a small open area remaining. Using silver alginate and 3 layer compression 8/3; patient presents for follow-up. We have been using collagen under 3 layer compression. He has no issues or complaints today. 8/11; patient presents for follow-up. We have been using collagen under 3 layer compression again he has no issues or complaints today. He reports improvement in wound healing. 8/15; patient presents for follow-up. We have been using collagen under 3 layer compression. He has no issues or complaints today. He has tolerated the wrap well. Electronic Signature(s) Signed: 10/06/2021 4:20:15 PM By: Russell Shan DO Entered By: Russell Collier on 10/06/2021 16:18:25 -------------------------------------------------------------------------------- Physical Exam Details Patient Name: Date of Service: Russell Collier 10/06/2021 3:30 PM Medical Record Number: [id number] Patient Account Number: [id number] Date of Birth/Sex: Treating RN: Mar 01, 1960 (61 y.o. Russell Collier Primary Care Provider: Alen Collier Other Clinician: Referring Provider: Treating Provider/Extender:  Helyn App Weeks in Treatment: 7 Constitutional respirations regular, non-labored and within target range for patient.. Cardiovascular 2+ dorsalis pedis/posterior tibialis pulses. Psychiatric pleasant and cooperative. Notes Left leg: Small open wound with granulation tissue, fibrinous tissue and slough. No signs of surrounding infection. Good edema control. Electronic Signature(s) Signed: 10/06/2021 4:20:15 PM By: Russell Shan DO Entered By: Russell Collier on 10/06/2021  16:18:54 -------------------------------------------------------------------------------- Physician Orders Details Patient Name: Date of Service: [address], Russell Collier 10/06/2021 3:30 PM Medical Record Number: [id number] Patient Account Number: [id number] Date of Birth/Sex: Treating RN: 27-Oct-1960 (61 y.o. Russell Collier Primary Care Provider: Alen Collier Other Clinician: Referring Provider: Treating Provider/Extender: Russell Collier in Treatment: 7 Verbal / Phone Orders: No Diagnosis Coding ICD-10 Coding Code Description [phone] Non-pressure chronic ulcer of other part of left lower leg with fat layer exposed I87.333 Chronic venous hypertension (idiopathic) with ulcer and inflammation of bilateral lower extremity I73.89 Other specified peripheral vascular diseases E11.622 Type 2 diabetes mellitus with other skin ulcer I25.10 Atherosclerotic heart disease of native coronary artery without angina pectoris Follow-up Appointments ppointment in 1 week. - 10/13/21 @ 1430 w/ Dr. Heber [place] and Allayne Butcher Rm # 9 Return A Other: - -Do not use Ibuprofen while on Eliquis Bathing/ Shower/ Hygiene May shower and wash wound with soap and water. - Use antibacterial soap, do not use peroxide or alcohol Edema Control - Lymphedema / SCD / Other Elevate legs to the level of the heart or above for 30 minutes daily and/or when sitting, a frequency of: - throughout the day Avoid standing for long periods of time. Patient to wear own compression stockings every day. - Use 15-23mHg Compression stocking for right leg. Additional Orders / Instructions Stop/Decrease Smoking Follow Nutritious Diet Wound Treatment Wound #2 - Lower Leg Wound Laterality: Left, Anterior Cleanser: Soap and Water 1 x Per Week/30 Days Discharge Instructions: May shower and wash wound with dial antibacterial soap and water prior to dressing change. Cleanser: Wound Cleanser 1 x Per Week/30 Days Discharge  Instructions: Cleanse the wound with wound cleanser prior to applying a clean dressing using gauze sponges, not tissue or cotton balls. Peri-Wound Care: Triamcinolone 15 (g) 1 x Per Week/30 Days Discharge Instructions: Use triamcinolone 15 (g) as directed Peri-Wound Care: Sween Lotion (Moisturizing lotion) 1 x Per Week/30 Days Discharge Instructions: Apply moisturizing lotion as directed Prim Dressing: Promogran Prisma Matrix, 4.34 (sq in) (silver collagen) 1 x Per Week/30 Days ary Discharge Instructions: Moisten collagen with saline or hydrogel Secondary Dressing: ABD Pad, 8x10 (Generic) 1 x Per Week/30 Days Discharge Instructions: Apply over primary dressing as directed. Secondary Dressing: Woven Gauze Sponge, Non-Sterile 4x4 in 1 x Per Week/30 Days Discharge Instructions: Apply over primary dressing as directed. Compression Wrap: ThreePress (3 layer compression wrap) 1 x Per Week/30 Days Discharge Instructions: Apply three layer compression as directed. Electronic Signature(s) Signed: 10/06/2021 4:20:15 PM By: HKalman ShanDO Entered By: HKalman Shanon 10/06/2021 16:19:02 -------------------------------------------------------------------------------- Problem List Details Patient Name: Date of Service: TJanell Collier Russell Collier 10/06/2021 3:30 PM Medical Record Number: [id number] Account Number: [id number] of Birth/Sex: Treating RN: 3Nov 25, 1962(61y.o. MMarcheta GrammesPrimary Care Provider: NAlen BleacherOther Clinician: Referring Provider: Treating Provider/Extender: HJannet Askewin Treatment: 7 Active Problems ICD-10 Encounter Code Description Active Date MDM Diagnosis [id number]-pressure chronic ulcer of other part of left lower leg with fat layer exposed6/21/2023 No Yes I87.333 Chronic venous hypertension (idiopathic) with ulcer and inflammation of 08/12/2021  No Yes bilateral lower extremity I73.89 Other specified peripheral vascular  diseases 08/12/2021 No Yes E11.622 Type 2 diabetes mellitus with other skin ulcer 08/12/2021 No Yes I25.10 Atherosclerotic heart disease of native coronary artery without angina pectoris 08/12/2021 No Yes Inactive Problems Resolved Problems Electronic Signature(s) Signed: 10/06/2021 4:20:15 PM By: Russell Shan DO Previous Signature: 10/06/2021 3:20:58 PM Version By: Russell Collier Entered By: Russell Collier on 10/06/2021 16:17:53 -------------------------------------------------------------------------------- Progress Note Details Patient Name: Date of Service: [place] 10/06/2021 3:30 PM Medical Record Number: [id number] Patient Account Number: [id number] Date of Birth/Sex: Treating RN: 10/27/1960 (61 y.o. Russell Collier Primary Care Provider: Alen Collier Other Clinician: Referring Provider: Treating Provider/Extender: Russell Collier in Treatment: 7 Subjective Chief Complaint Information obtained from Patient Left anterior LE Ulcer History of Present Illness (HPI) ADMISSION 09/14/2018 This is a 61 year old man who traumatized his left anterior tibia area on the bathtub. He describes this initially as a bruise that subsequently reopened. He has been seen in the ER several times and has had at least 3 courses of antibiotics including doxy and Keflex twice. He has been using Neosporin and peroxide on this. Past medical history includes obstructive sleep apnea, cellulitis, hypertension, coronary artery disease, peripheral neuropathy [nondiabetic], currently on a heart murmur for atypical chest pain and bradycardia. 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 ARTERIAL STUDIES done in March 2020 showed an ABI on the left of 0.93, TBI on the left slightly reduced at 0.42. On the right his ABI was 0.95 with a TBI of 0.51 he has monophasic waveforms in the lower extremities bilaterally. Interpreted on the left this showing mild left lower extremity arterial  disease as well as on the right. 09/25/18-Patient returns at 1 week to the clinic, noted that arterial studies done in March 2020. We have been using Iodoflex to the wound on the left leg, patient has significant degree of pain and declines to have any debridement 8/21; the patient has not been here in 3 weeks. Since he was last here he was admitted to hospital on 8/4. He was felt to have stable angina. He has since been to his cardiologist. I note that he has an EF of about 48%. He has a venous wound on the left anterior mid tibia area probably with lymphedema. We have been using Iodoflex. He does not tolerate debridement well. He tells me that his cardiologist has been adjusting his Lasix and then the wraps fall down he tries to pull them up. He is describing pain making it difficult for him to function. 9/3; patient is was hospitalized since he was last here with COPD exacerbation. Came in with Aquacel Ag on the wound. The wound actually looks improved. 9/17; patient was apparently in the ER again with breathing issues and blood pressure issues. He was not admitted. We are using Sorbact of the wound with nice improvement dimensions are better 10/1; the patient's wound is smaller and appears to have a reasonable healthy surface. We are using Sorbact 10/8; the wound is smaller although there is still an area with depth and necrotic debris at the base. We are using Sorbact 10/15; not much change in the wound area. We have been using Sorbact. 10/22; small wound area using endoform since last week 11/5; Mr. Degregory arrived today in a healed state. Left leg and wrapped. He still does not have compression stockings Readmission: 08-12-2021 upon evaluation today patient presents for evaluation here in the clinic he has been seen last in  2020 at that point Russell Collier was the treating physician. Subsequently the patient tells me that he does have a history of peripheral vascular disease, chronic venous  insufficiency, diabetes mellitus type 2, and coronary artery disease. Unfortunately he has had some issues with his right leg he recently has been [facility] and they did perform an arteriogram on the right leg I believe even putting a stent in. With that being said that leg seems to be doing well he mainly just needs a compression sock. In regard to the left leg which is the one in question he tells me the same day he had a surgery he hit the left leg on a piece of furniture on the anterior shin. This caused a area that seems to be a hematoma present at this point. With that being said the patient is having significant discomfort. He tells me. He has been taking ibuprofen which he should not be doing I discussed that with him today. Tylenol is really only should be taken being on the Eliquis. He also tells me that he has not really been put anything on other than a Band-Aid I really think he may benefit from some light compression I do not want to do anything too tight until we get a good review of his arterial flow as his ABIs are not good we got here in the office today was 0.75 on the left 0.65 on the right and to be honest he did not have palpable pulses that I could find. He does not appear to have had an arterial study since 2020 as noted above by Russell Collier. 08-19-2021 upon evaluation today patient appears to be doing okay in regard to his wound although he does appear to have some fluid buildup underneath this area. Again I am a little reluctant to try to open this up right now due to the fact that the patient does not appear to have good blood flow based on our initial inspection during the clinic evaluation today. Again he does see his vascular surgeon today. We did not have any palpable pulses that we could find last time I saw him and overall I think that he probably just needs to be seen in order to evaluate and see where things stand. 08-26-2021 upon evaluation  today patient appears to be doing well currently in regard to his wound. Has been tolerating the dressing changes without complication. Fortunately we do not have a big open area but he does have some fluid underneath in the center this looks like it might start draining on its own soon which would be ideal. With that being said right now he has been using the Curlex and Coban wrap which she does tolerate okay. He is going for an arteriogram on 14 July. After that if we need to open this we should be able to presuming they get his blood flow going appropriately. 09-02-2021 upon evaluation today patient appears to be doing about the same in regard to his wound this is starting to drain a lot more so it is definitely opening into something a little bit deeper. With that being said I do see signs of improvement to some degree and that there is not as much fluid buildup down in the base of the wound. I am very pleased with that. He is supposed to be having his vascular intervention on Monday after that hopefully will have better blood flow we will try to get  this open to be getting draining completely and hopefully get this to resolve. He is not really wearing the compression right now because of discomfort that may again also be partially related to the fact that leg does not have sufficient blood flow to be able and heal this appropriately. Again once we get good blood flow hopefully the pain will not be as much we will get better compression on him as well. 09-09-2021 patient underwent the balloon angioplasty of the left peroneal artery on 09-07-2021. It sounds like that the area of stenosis was resolved based on what I am seeing at this point in time. It looks like the Dr. Gwenlyn Saran was satisfied with the blood flow postprocedure which was awesome. The patient had no issues with complication during or following the procedure. He is actually present for evaluation here in the clinic today and the good news is the  wound is already looking much better and has only been a couple of days. Fortunately he is not having a terrible amount of pain though is still having some discomfort at this time. I definitely think this is something that we can manage quite nicely. 7/26; the patient underwent a balloon angioplasty of the left peroneal artery on 09/07/2021 he tolerated this well he has a small open area remaining. Using silver alginate and 3 layer compression 8/3; patient presents for follow-up. We have been using collagen under 3 layer compression. He has no issues or complaints today. 8/11; patient presents for follow-up. We have been using collagen under 3 layer compression again he has no issues or complaints today. He reports improvement in wound healing. 8/15; patient presents for follow-up. We have been using collagen under 3 layer compression. He has no issues or complaints today. He has tolerated the wrap well. Patient History Information obtained from Patient. Family History Cancer - Father, Diabetes - Father,Paternal Grandparents, Heart Disease - Mother, Hypertension - Mother, Kidney Disease - Maternal Grandparents, Lung Disease - Father, Stroke - Father, No family history of Hereditary Spherocytosis, Seizures, Thyroid Problems, Tuberculosis. 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 Medical History Eyes Denies history of Cataracts, Glaucoma, Optic Neuritis Hematologic/Lymphatic Patient has history of Hemophilia - Borderline Denies history of Anemia, Human Immunodeficiency Virus, Lymphedema, Sickle Cell Disease Respiratory Patient has history of Asthma, Sleep Apnea Denies history of Aspiration, Chronic Obstructive Pulmonary Disease (COPD), Pneumothorax, Tuberculosis Cardiovascular Patient has history of Angina - Atypical, Congestive Heart Failure, Coronary Artery Disease,  Hypertension, Peripheral Arterial Disease Denies history of Arrhythmia, Deep Vein Thrombosis, Hypotension, Myocardial Infarction, Peripheral Venous Disease, Phlebitis, Vasculitis Gastrointestinal Denies history of Cirrhosis , Colitis, Crohnoos, Hepatitis A, Hepatitis B, Hepatitis C Endocrine Patient has history of Type II Diabetes Denies history of Type I Diabetes Genitourinary Denies history of End Stage Renal Disease Immunological Denies history of Lupus Erythematosus, Raynaudoos, Scleroderma Integumentary (Skin) Denies history of History of Burn Musculoskeletal Patient has history of Osteoarthritis Denies history of Gout, Rheumatoid Arthritis, Osteomyelitis Neurologic Patient has history of Neuropathy Denies history of Dementia, Quadriplegia, Paraplegia, Seizure Disorder Oncologic Denies history of Received Chemotherapy, Received Radiation Psychiatric Denies history of Anorexia/bulimia, Confinement Anxiety Hospitalization/Surgery History [facility]2018 - Acute Resp Failure. - cardiac cath with stent 12/19/18. 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 Vitals  Time Taken: 3:42 PM, Height: 70 in, Weight: 315 lbs, BMI: 45.2, Temperature: 98.8 F, Pulse: 85 bpm, Respiratory Rate: 18 breaths/min, Blood Pressure: 115/66 mmHg. Cardiovascular 2+ dorsalis pedis/posterior tibialis pulses. Psychiatric pleasant and cooperative. General Notes: Left leg: Small open wound with granulation tissue, fibrinous tissue and slough. No signs of surrounding infection. Good edema control. Integumentary (Hair, Skin) Wound #2 status is Open. Original cause of wound was Trauma. The date acquired was: 08/03/2021. The wound has been in treatment 7 weeks. The wound is located on the Left,Anterior Lower Leg.  The wound measures 1cm length x 0.6cm width x 0.1cm depth; 0.471cm^2 area and 0.047cm^3 volume. There is Fat Layer (Subcutaneous Tissue) exposed. There is no tunneling or undermining noted. There is a large amount of serous drainage noted. The wound margin is distinct with the outline attached to the wound base. There is small (1-33%) red granulation within the wound bed. There is a large (67-100%) amount of necrotic tissue within the wound bed including Adherent Slough. Assessment Active Problems ICD-10 Non-pressure chronic ulcer of other part of left lower leg with fat layer exposed Chronic venous hypertension (idiopathic) with ulcer and inflammation of bilateral lower extremity Other specified peripheral vascular diseases Type 2 diabetes mellitus with other skin ulcer Atherosclerotic heart disease of native coronary artery without angina pectoris Patient's wound has shown improvement in size in appearance since last clinic visit. I debrided nonviable tissue. I recommended continuing the course with collagen and 3 layer compression. Follow-up in 1 week Procedures Wound #2 Pre-procedure diagnosis of Wound #2 is an Arterial Insufficiency Ulcer located on the Left,Anterior Lower Leg .Severity of Tissue Pre Debridement is: Fat layer exposed. There was a Excisional Skin/Subcutaneous Tissue Debridement with a total area of 0.6 sq cm performed by Russell Shan, DO. With the following instrument(s): Curette to remove Non-Viable tissue/material. Material removed includes Subcutaneous Tissue and Slough and after achieving pain control using Lidocaine 4% Topical Solution. No specimens were taken. A time out was conducted at 16:09, prior to the start of the procedure. A Minimum amount of bleeding was controlled with Pressure. The procedure was tolerated well. Post Debridement Measurements: 1cm length x 0.6cm width x 0.1cm depth; 0.047cm^3 volume. Character of Wound/Ulcer Post Debridement is stable.  Severity of Tissue Post Debridement is: Fat layer exposed. Post procedure Diagnosis Wound #2: Same as Pre-Procedure Pre-procedure diagnosis of Wound #2 is an Arterial Insufficiency Ulcer located on the Left,Anterior Lower Leg . There was a Three Layer Compression Therapy Procedure by Russell Jackson, RN. Post procedure Diagnosis Wound #2: Same as Pre-Procedure Plan Follow-up Appointments: Return Appointment in 1 week. - 10/13/21 @ 1430 w/ Dr. Heber  and Allayne Butcher Rm # 9 Other: - -Do not use Ibuprofen while on Eliquis Bathing/ Shower/ Hygiene: May shower and wash wound with soap and water. - Use antibacterial soap, do not use peroxide or alcohol Edema Control - Lymphedema / SCD / Other: Elevate legs to the level of the heart or above for 30 minutes daily and/or when sitting, a frequency of: - throughout the day Avoid standing for long periods of time. Patient to wear own compression stockings every day. - Use 15-96mHg Compression stocking for right leg. Additional Orders / Instructions: Stop/Decrease Smoking Follow Nutritious Diet WOUND #2: - Lower Leg Wound Laterality: Left, Anterior Cleanser: Soap and Water 1 x Per Week/30 Days Discharge Instructions: May shower and wash wound with dial antibacterial soap and water prior to dressing change. Cleanser: Wound Cleanser 1 x Per Week/30 Days Discharge Instructions: Cleanse  the wound with wound cleanser prior to applying a clean dressing using gauze sponges, not tissue or cotton balls. Peri-Wound Care: Triamcinolone 15 (g) 1 x Per Week/30 Days Discharge Instructions: Use triamcinolone 15 (g) as directed Peri-Wound Care: Sween Lotion (Moisturizing lotion) 1 x Per Week/30 Days Discharge Instructions: Apply moisturizing lotion as directed Prim Dressing: Promogran Prisma Matrix, 4.34 (sq in) (silver collagen) 1 x Per Week/30 Days ary Discharge Instructions: Moisten collagen with saline or hydrogel Secondary Dressing: ABD Pad, 8x10 (Generic) 1 x Per  Week/30 Days Discharge Instructions: Apply over primary dressing as directed. Secondary Dressing: Woven Gauze Sponge, Non-Sterile 4x4 in 1 x Per Week/30 Days Discharge Instructions: Apply over primary dressing as directed. Com pression Wrap: ThreePress (3 layer compression wrap) 1 x Per Week/30 Days Discharge Instructions: Apply three layer compression as directed. 1. In office sharp debridement 2. Collagen under 3 layer compression 3. Follow-up in 1 week Electronic Signature(s) Signed: 10/06/2021 4:20:15 PM By: Russell Shan DO Entered By: Russell Collier on 10/06/2021 16:19:45 -------------------------------------------------------------------------------- HxROS Details Patient Name: Date of Service: [place] 10/06/2021 3:30 PM Medical Record Number: [id number] Patient Account Number: [id number] Date of Birth/Sex: Treating RN: 17-Mar-1960 (61 y.o. Russell Collier Primary Care Provider: Alen Collier Other Clinician: Referring Provider: Treating Provider/Extender: Russell Collier in Treatment: 7 Information Obtained From Patient Eyes Medical History: Negative for: Cataracts; Glaucoma; Optic Neuritis Hematologic/Lymphatic Medical History: Positive for: Hemophilia - Borderline Negative for: Anemia; Human Immunodeficiency Virus; Lymphedema; Sickle Cell Disease Respiratory Medical History: Positive for: Asthma; Sleep Apnea Negative for: Aspiration; Chronic Obstructive Pulmonary Disease (COPD); Pneumothorax; Tuberculosis Past Medical History Notes: Pulmonary Embolism Cardiovascular Medical History: Positive for: Angina - Atypical; Congestive Heart Failure; Coronary Artery Disease; Hypertension; Peripheral Arterial Disease Negative for: Arrhythmia; Deep Vein Thrombosis; Hypotension; Myocardial Infarction; Peripheral Venous Disease; Phlebitis; Vasculitis Past Medical History Notes: Hyperlipidemia, Bradycardia, 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 Right  Leg Gastrointestinal Medical History: Negative for: Cirrhosis ; Colitis; Crohns; Hepatitis A; Hepatitis B; Hepatitis C Endocrine Medical History: Positive for: Type II Diabetes Negative for: Type I Diabetes Treated with: Oral agents Blood sugar tested every day: No Genitourinary Medical History: Negative for: End Stage Renal Disease Immunological Medical History: Negative for: Lupus Erythematosus; Raynauds; Scleroderma Integumentary (Skin) Medical History: Negative for: History of Burn Musculoskeletal Medical History: Positive for: Osteoarthritis Negative for: Gout; Rheumatoid Arthritis; Osteomyelitis Neurologic Medical History: Positive for: Neuropathy Negative for: Dementia; Quadriplegia; Paraplegia; Seizure Disorder Oncologic Medical History: Negative for: Received Chemotherapy; Received Radiation Psychiatric Medical History: Negative for: Anorexia/bulimia; Confinement Anxiety Immunizations Pneumococcal Vaccine: Received Pneumococcal Vaccination: Yes Received Pneumococcal Vaccination On or After 60th Birthday: No Implantable Devices Yes Hospitalization / Surgery History Type of Hospitalization/Surgery [facility]2018 - Acute Resp Failure cardiac cath with stent 12/19/18 Family and Social History Cancer: Yes - Father; Diabetes: Yes - Father,Paternal Grandparents; Heart Disease: Yes - Mother; Hereditary Spherocytosis: No; Hypertension: Yes - Mother; Kidney Disease: Yes - Maternal Grandparents; Lung Disease: Yes - Father; Seizures: No; Stroke: Yes - Father; Thyroid Problems: No; Tuberculosis: No; Current every day smoker - Cigars 1 PPD; Marital Status - Divorced; Alcohol Use: Rarely; Drug Use: Prior History - Cocaine; Caffeine Use: Rarely; Financial Concerns: No; Food, Clothing or Shelter Needs: No; Support System Lacking: No; Transportation Concerns: No Electronic Signature(s) Signed: 10/06/2021 4:20:15 PM By: Russell Shan DO Signed: 10/06/2021 5:00:27 PM By:  Russell Collier Entered By: Russell Collier on 10/06/2021 16:18:31 -------------------------------------------------------------------------------- SuperBill Details Patient Name: Date of Service: Russell Collier, Russell Collier 10/06/2021 Medical Record Number:  [id number] Patient Account Number: [id number] Date of Birth/Sex: Treating RN: [birth year] (61 y.o. Russell Collier Primary Care Provider: Alen Collier Other Clinician: Referring Provider: Treating Provider/Extender: Russell Collier in Treatment: 7 Diagnosis Coding ICD-10 Codes Code Description [phone] Non-pressure chronic ulcer of other part of left lower leg with fat layer exposed I87.333 Chronic venous hypertension (idiopathic) with ulcer and inflammation of bilateral lower extremity I73.89 Other specified peripheral vascular diseases E11.622 Type 2 diabetes mellitus with other skin ulcer I25.10 Atherosclerotic heart disease of native coronary artery without angina pectoris Facility Procedures CPT4 Code: [id number] Description: 21117 - DEB SUBQ TISSUE 20 SQ CM/< ICD-10 Diagnosis Description L97.822 Non-pressure chronic ulcer of other part of left lower leg with fat layer expo Modifier: sed Quantity: 1 Physician Procedures : CPT4 Code Description Modifier [id number] 11042 - WC PHYS SUBQ TISS 20 SQ CM ICD-10 Diagnosis Description L97.822 Non-pressure chronic ulcer of other part of left lower leg with fat layer exposed Quantity: 1 Electronic Signature(s) Signed: 10/06/2021 4:20:15 PM By: Russell Shan DO Entered By: Russell Collier on 10/06/2021 16:19:52

## 2021-10-07 NOTE — Telephone Encounter (Signed)
-----   Message from Leavy Cella, [place] sent at 08/07/2021 12:04 PM EDT ----- Regarding: Tobacco intake reduction ?  Smoking < 10?

## 2021-10-07 NOTE — Telephone Encounter (Signed)
Patient contacted for follow/up of tobacco intake reduction.  Since last contact patient reports continued smoking of ~ 10-11 per day.   Medications currently being used; None.  Patient reports he is prepared to call the quit line to ask for support in quitting.  Provided information on 1 800-QUIT NOW support program.  Patient is participating in a Managed Medicaid Plan:  Yes  Total time with patient call and documentation of interaction: 11 minutes. Follow-up phone call planned: 4-6 weeks

## 2021-10-07 NOTE — Telephone Encounter (Signed)
Noted and agree. 

## 2021-10-08 DIAGNOSIS — M5136 Other intervertebral disc degeneration, lumbar region: Secondary | ICD-10-CM | POA: Diagnosis not present

## 2021-10-09 DIAGNOSIS — M5136 Other intervertebral disc degeneration, lumbar region: Secondary | ICD-10-CM | POA: Diagnosis not present

## 2021-10-09 NOTE — Progress Notes (Signed)
Russell Collier ([id number]) Visit Report for 10/01/2021 Chief Complaint Document Details Patient Name: Date of Service: Russell Collier 10/01/2021 3:15 PM Medical Record Number: [id number] Patient Account Number: [id number] Date of Birth/Sex: Treating RN: 07-15-1960 (61 y.o. Russell Collier Primary Care Provider: Alen Bleacher Other Clinician: Referring Provider: Treating Provider/Extender: Jannet Askew in Treatment: 7 Information Obtained from: Patient Chief Complaint Left anterior LE Ulcer Electronic Signature(s) Signed: 10/02/2021 1:41:40 PM By: Kalman Shan DO Entered By: Kalman Shan on 10/02/2021 13:30:59 -------------------------------------------------------------------------------- Debridement Details Patient Name: Date of Service: Russell Collier, RO Collier 10/01/2021 3:15 PM Medical Record Number: [id number] Patient Account Number: [id number] Date of Birth/Sex: Treating RN: 05/12/1960 (61 y.o. Russell Collier Primary Care Provider: Alen Bleacher Other Clinician: Referring Provider: Treating Provider/Extender: Jannet Askew in Treatment: 7 Debridement Performed for Assessment: Wound #2 Left,Anterior Lower Leg Performed By: Physician Kalman Shan, DO Debridement Type: Debridement Severity of Tissue Pre Debridement: Fat layer exposed Level of Consciousness (Pre-procedure): Awake and Alert Pre-procedure Verification/Time Out Yes - 16:12 Taken: Start Time: 16:12 Pain Control: Lidocaine T Area Debrided (L x W): otal 0.9 (cm) x 0.5 (cm) = 0.45 (cm) Tissue and other material debrided: Viable, Non-Viable, Slough, Subcutaneous, Slough Level: Skin/Subcutaneous Tissue Debridement Description: Excisional Instrument: Curette Bleeding: Minimum Hemostasis Achieved: Pressure End Time: 16:12 Procedural Pain: 0 Post Procedural Pain: 0 Response to Treatment: Procedure was tolerated well Level of Consciousness (Post-  Awake and Alert procedure): Post Debridement Measurements of Total Wound Length: (cm) 0.9 Width: (cm) 0.5 Depth: (cm) 0.3 Volume: (cm) 0.106 Character of Wound/Ulcer Post Debridement: Improved Severity of Tissue Post Debridement: Fat layer exposed Post Procedure Diagnosis Same as Pre-procedure Electronic Signature(s) Signed: 10/01/2021 5:01:19 PM By: Rhae Hammock RN Signed: 10/02/2021 1:41:40 PM By: Kalman Shan DO Entered By: Rhae Hammock on 10/01/2021 16:20:44 -------------------------------------------------------------------------------- HPI Details Patient Name: Date of Service: Russell Collier 10/01/2021 3:15 PM Medical Record Number: [id number] Patient Account Number: [id number] Date of Birth/Sex: Treating RN: Jul 17, 1960 (61 y.o. Russell Collier Primary Care Provider: Alen Bleacher Other Clinician: Referring Provider: Treating Provider/Extender: Jannet Askew in Treatment: 7 History of Present Illness HPI Description: ADMISSION 09/14/2018 This is a 61 year old man who traumatized his left anterior tibia area on the bathtub. He describes this initially as a bruise that subsequently reopened. He has been seen in the ER several times and has had at least 3 courses of antibiotics including doxy and Keflex twice. He has been using Neosporin and peroxide on this. Past medical history includes obstructive sleep apnea, cellulitis, hypertension, coronary artery disease, peripheral neuropathy [nondiabetic], currently on a heart murmur for atypical chest pain and bradycardia. 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 ARTERIAL STUDIES done in March 2020 showed an ABI on the left of 0.93, TBI on the left slightly reduced at 0.42. On the right his ABI was 0.95 with a TBI of 0.51 he has monophasic waveforms in the lower extremities bilaterally. Interpreted on the left this showing mild left lower extremity arterial disease as well as on the  right. 09/25/18-Patient returns at 1 week to the clinic, noted that arterial studies done in March 2020. We have been using Iodoflex to the wound on the left leg, patient has significant degree of pain and declines to have any debridement 8/21; the patient has not been here in 3 weeks. Since he was last here he was admitted to hospital on 8/4. He was felt to have stable angina. He has since  been to his cardiologist. I note that he has an EF of about 48%. He has a venous wound on the left anterior mid tibia area probably with lymphedema. We have been using Iodoflex. He does not tolerate debridement well. He tells me that his cardiologist has been adjusting his Lasix and then the wraps fall down he tries to pull them up. He is describing pain making it difficult for him to function. 9/3; patient is was hospitalized since he was last here with COPD exacerbation. Came in with Aquacel Ag on the wound. The wound actually looks improved. 9/17; patient was apparently in the ER again with breathing issues and blood pressure issues. He was not admitted. We are using Sorbact of the wound with nice improvement dimensions are better 10/1; the patient's wound is smaller and appears to have a reasonable healthy surface. We are using Sorbact 10/8; the wound is smaller although there is still an area with depth and necrotic debris at the base. We are using Sorbact 10/15; not much change in the wound area. We have been using Sorbact. 10/22; small wound area using endoform since last week 11/5; Russell Collier arrived today in a healed state. Left leg and wrapped. He still does not have compression stockings Readmission: 08-12-2021 upon evaluation today patient presents for evaluation here in the clinic he has been seen last in 2020 at that point Dr. Dellia Nims was the treating physician. Subsequently the patient tells me that he does have a history of peripheral vascular disease, chronic venous insufficiency, diabetes mellitus  type 2, and coronary artery disease. Unfortunately he has had some issues with his right leg he recently has been [facility] and they did perform an arteriogram on the right leg I believe even putting a stent in. With that being said that leg seems to be doing well he mainly just needs a compression sock. In regard to the left leg which is the one in question he tells me the same day he had a surgery he hit the left leg on a piece of furniture on the anterior shin. This caused a area that seems to be a hematoma present at this point. With that being said the patient is having significant discomfort. He tells me. He has been taking ibuprofen which he should not be doing I discussed that with him today. Tylenol is really only should be taken being on the Eliquis. He also tells me that he has not really been put anything on other than a Band-Aid I really think he may benefit from some light compression I do not want to do anything too tight until we get a good review of his arterial flow as his ABIs are not good we got here in the office today was 0.75 on the left 0.65 on the right and to be honest he did not have palpable pulses that I could find. He does not appear to have had an arterial study since 2020 as noted above by Dr. Dellia Nims. 08-19-2021 upon evaluation today patient appears to be doing okay in regard to his wound although he does appear to have some fluid buildup underneath this area. Again I am a little reluctant to try to open this up right now due to the fact that the patient does not appear to have good blood flow based on our initial inspection during the clinic evaluation today. Again he does see his vascular surgeon today. We did not have any palpable pulses  that we could find last time I saw him and overall I think that he probably just needs to be seen in order to evaluate and see where things stand. 08-26-2021 upon evaluation today patient appears to be doing  well currently in regard to his wound. Has been tolerating the dressing changes without complication. Fortunately we do not have a big open area but he does have some fluid underneath in the center this looks like it might start draining on its own soon which would be ideal. With that being said right now he has been using the Curlex and Coban wrap which she does tolerate okay. He is going for an arteriogram on 14 July. After that if we need to open this we should be able to presuming they get his blood flow going appropriately. 09-02-2021 upon evaluation today patient appears to be doing about the same in regard to his wound this is starting to drain a lot more so it is definitely opening into something a little bit deeper. With that being said I do see signs of improvement to some degree and that there is not as much fluid buildup down in the base of the wound. I am very pleased with that. He is supposed to be having his vascular intervention on Monday after that hopefully will have better blood flow we will try to get this open to be getting draining completely and hopefully get this to resolve. He is not really wearing the compression right now because of discomfort that may again also be partially related to the fact that leg does not have sufficient blood flow to be able and heal this appropriately. Again once we get good blood flow hopefully the pain will not be as much we will get better compression on him as well. 09-09-2021 patient underwent the balloon angioplasty of the left peroneal artery on 09-07-2021. It sounds like that the area of stenosis was resolved based on what I am seeing at this point in time. It looks like the Dr. Gwenlyn Saran was satisfied with the blood flow postprocedure which was awesome. The patient had no issues with complication during or following the procedure. He is actually present for evaluation here in the clinic today and the good news is the wound is already looking much  better and has only been a couple of days. Fortunately he is not having a terrible amount of pain though is still having some discomfort at this time. I definitely think this is something that we can manage quite nicely. 7/26; the patient underwent a balloon angioplasty of the left peroneal artery on 09/07/2021 he tolerated this well he has a small open area remaining. Using silver alginate and 3 layer compression 8/3; patient presents for follow-up. We have been using collagen under 3 layer compression. He has no issues or complaints today. 8/11; patient presents for follow-up. We have been using collagen under 3 layer compression again he has no issues or complaints today. He reports improvement in wound healing. Electronic Signature(s) Signed: 10/02/2021 1:41:40 PM By: Kalman Shan DO Entered By: Kalman Shan on 10/02/2021 13:31:27 -------------------------------------------------------------------------------- Physical Exam Details Patient Name: Date of Service: Russell Collier 10/01/2021 3:15 PM Medical Record Number: [id number] Patient Account Number: [id number] Date of Birth/Sex: Treating RN: 06-09-1960 (61 y.o. Erie Noe Primary Care Provider: Alen Bleacher Other Clinician: Referring Provider: Treating Provider/Extender: Jannet Askew in Treatment: 7 Constitutional respirations regular, non-labored and within target range for patient.. Cardiovascular 2+ dorsalis pedis/posterior  tibialis pulses. Psychiatric pleasant and cooperative. Notes Left leg: Small open wound with granulation tissue, fibrinous tissue and slough. No signs of surrounding infection. Good edema control. Electronic Signature(s) Signed: 10/02/2021 1:41:40 PM By: Kalman Shan DO Entered By: Kalman Shan on 10/02/2021 13:32:07 -------------------------------------------------------------------------------- Physician Orders Details Patient Name: Date of Service: [place], RO Collier 10/01/2021 3:15 PM Medical Record Number: [id number] Patient Account Number: [id number] Date of Birth/Sex: Treating RN: [birth year] (61 y.o. Erie Noe Primary Care Provider: Alen Bleacher Other Clinician: Referring Provider: Treating Provider/Extender: Jannet Askew in Treatment: 7 Verbal / Phone Orders: No Diagnosis Coding Follow-up Appointments ppointment in 1 week. - 10/06/21 @ 1530 w/ Dr. Heber Fulshear and Sanjuan Dame # 7 Return A 10/13/21 @ 1430 w/ Dr. Heber Maribel and Allayne Butcher Rm # 9 Other: - -Do not use Ibuprofen while on Eliquis Bathing/ Shower/ Hygiene May shower and wash wound with soap and water. - Use antibacterial soap, do not use peroxide or alcohol Edema Control - Lymphedema / SCD / Other Elevate legs to the level of the heart or above for 30 minutes daily and/or when sitting, a frequency of: - throughout the day Avoid standing for long periods of time. Patient to wear own compression stockings every day. - Use 15-55mHg Compression stocking for right leg. Additional Orders / Instructions Stop/Decrease Smoking Follow Nutritious Diet Wound Treatment Wound #2 - Lower Leg Wound Laterality: Left, Anterior Cleanser: Soap and Water 1 x Per Day/30 Days Discharge Instructions: May shower and wash wound with dial antibacterial soap and water prior to dressing change. Cleanser: Wound Cleanser 1 x Per Day/30 Days Discharge Instructions: Cleanse the wound with wound cleanser prior to applying a clean dressing using gauze sponges, not tissue or cotton balls. Peri-Wound Care: Triamcinolone 15 (g) 1 x Per Day/30 Days Discharge Instructions: Use triamcinolone 15 (g) as directed Peri-Wound Care: Sween Lotion (Moisturizing lotion) 1 x Per Day/30 Days Discharge Instructions: Apply moisturizing lotion as directed Topical: Triamcinolone 1 x Per Day/30 Days Discharge Instructions: Apply Triamcinolone as directed Prim Dressing: Promogran Prisma Matrix, 4.34  (sq in) (silver collagen) 1 x Per Day/30 Days ary Discharge Instructions: Moisten collagen with saline or hydrogel Secondary Dressing: ABD Pad, 8x10 (Generic) 1 x Per Day/30 Days Discharge Instructions: Apply over primary dressing as directed. Secondary Dressing: Woven Gauze Sponge, Non-Sterile 4x4 in 1 x Per Day/30 Days Discharge Instructions: Apply over primary dressing as directed. Compression Wrap: ThreePress (3 layer compression wrap) 1 x Per Day/30 Days Discharge Instructions: Apply three layer compression as directed. Electronic Signature(s) Signed: 10/02/2021 1:41:40 PM By: HKalman ShanDO Previous Signature: 10/01/2021 4:44:02 PM Version By: DErenest BlankEntered By: HKalman Shanon 10/02/2021 13:32:17 -------------------------------------------------------------------------------- Problem List Details Patient Name: Date of Service: [place] RO Collier 10/01/2021 3:15 PM Medical Record Number: [id number] Account Number: [id number] of Birth/Sex: Treating RN: [birth year](61y.o. MBurnadette Pop Collier Primary Care Provider: NAlen BleacherOther Clinician: Referring Provider: Treating Provider/Extender: HJannet Askewin Treatment: 7 Active Problems ICD-10 Encounter Code Description Active Date MDM Diagnosis [phone]-pressure chronic ulcer of other part of left lower leg with fat layer exposed6/21/2023 No Yes I87.333 Chronic venous hypertension (idiopathic) with ulcer and inflammation of 08/12/2021 No Yes bilateral lower extremity I73.89 Other specified peripheral vascular diseases 08/12/2021 No Yes E11.622 Type 2 diabetes mellitus with other skin ulcer 08/12/2021 No Yes I25.10 Atherosclerotic heart disease of native coronary artery without angina pectoris 08/12/2021 No Yes Inactive Problems Resolved Problems Electronic Signature(s) Signed:  10/02/2021 1:41:40 PM By: Kalman Shan DO Entered By: Kalman Shan on 10/02/2021  13:28:37 -------------------------------------------------------------------------------- Progress Note Details Patient Name: Date of Service: [place], RO Collier 10/01/2021 3:15 PM Medical Record Number: [id number] Patient Account Number: [id number] Date of Birth/Sex: Treating RN: 1961-02-12 (60 y.o. Russell Collier Primary Care Provider: Alen Bleacher Other Clinician: Referring Provider: Treating Provider/Extender: Jannet Askew in Treatment: 7 Subjective Chief Complaint Information obtained from Patient Left anterior LE Ulcer History of Present Illness (HPI) ADMISSION 09/14/2018 This is a 61 year old man who traumatized his left anterior tibia area on the bathtub. He describes this initially as a bruise that subsequently reopened. He has been seen in the ER several times and has had at least 3 courses of antibiotics including doxy and Keflex twice. He has been using Neosporin and peroxide on this. Past medical history includes obstructive sleep apnea, cellulitis, hypertension, coronary artery disease, peripheral neuropathy [nondiabetic], currently on a heart murmur for atypical chest pain and bradycardia. 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 ARTERIAL STUDIES done in March 2020 showed an ABI on the left of 0.93, TBI on the left slightly reduced at 0.42. On the right his ABI was 0.95 with a TBI of 0.51 he has monophasic waveforms in the lower extremities bilaterally. Interpreted on the left this showing mild left lower extremity arterial disease as well as on the right. 09/25/18-Patient returns at 1 week to the clinic, noted that arterial studies done in March 2020. We have been using Iodoflex to the wound on the left leg, patient has significant degree of pain and declines to have any debridement 8/21; the patient has not been here in 3 weeks. Since he was last here he was admitted to hospital on 8/4. He was felt to have stable angina. He has since been to his  cardiologist. I note that he has an EF of about 48%. He has a venous wound on the left anterior mid tibia area probably with lymphedema. We have been using Iodoflex. He does not tolerate debridement well. He tells me that his cardiologist has been adjusting his Lasix and then the wraps fall down he tries to pull them up. He is describing pain making it difficult for him to function. 9/3; patient is was hospitalized since he was last here with COPD exacerbation. Came in with Aquacel Ag on the wound. The wound actually looks improved. 9/17; patient was apparently in the ER again with breathing issues and blood pressure issues. He was not admitted. We are using Sorbact of the wound with nice improvement dimensions are better 10/1; the patient's wound is smaller and appears to have a reasonable healthy surface. We are using Sorbact 10/8; the wound is smaller although there is still an area with depth and necrotic debris at the base. We are using Sorbact 10/15; not much change in the wound area. We have been using Sorbact. 10/22; small wound area using endoform since last week 11/5; Mr. Karwowski arrived today in a healed state. Left leg and wrapped. He still does not have compression stockings Readmission: 08-12-2021 upon evaluation today patient presents for evaluation here in the clinic he has been seen last in 2020 at that point Dr. Dellia Nims was the treating physician. Subsequently the patient tells me that he does have a history of peripheral vascular disease, chronic venous insufficiency, diabetes mellitus type 2, and coronary artery disease. Unfortunately he has had some issues with his right leg he recently has been seen by [facility] and they did perform an arteriogram on the right leg I believe even putting a stent in. With that being said that leg seems to be doing well he mainly just needs a compression sock. In regard to the left leg which is the one in question he tells me the  same day he had a surgery he hit the left leg on a piece of furniture on the anterior shin. This caused a area that seems to be a hematoma present at this point. With that being said the patient is having significant discomfort. He tells me. He has been taking ibuprofen which he should not be doing I discussed that with him today. Tylenol is really only should be taken being on the Eliquis. He also tells me that he has not really been put anything on other than a Band-Aid I really think he may benefit from some light compression I do not want to do anything too tight until we get a good review of his arterial flow as his ABIs are not good we got here in the office today was 0.75 on the left 0.65 on the right and to be honest he did not have palpable pulses that I could find. He does not appear to have had an arterial study since 2020 as noted above by Dr. Dellia Nims. 08-19-2021 upon evaluation today patient appears to be doing okay in regard to his wound although he does appear to have some fluid buildup underneath this area. Again I am a little reluctant to try to open this up right now due to the fact that the patient does not appear to have good blood flow based on our initial inspection during the clinic evaluation today. Again he does see his vascular surgeon today. We did not have any palpable pulses that we could find last time I saw him and overall I think that he probably just needs to be seen in order to evaluate and see where things stand. 08-26-2021 upon evaluation today patient appears to be doing well currently in regard to his wound. Has been tolerating the dressing changes without complication. Fortunately we do not have a big open area but he does have some fluid underneath in the center this looks like it might start draining on its own soon which would be ideal. With that being said right now he has been using the Curlex and Coban wrap which she does tolerate okay. He is going for  an arteriogram on 14 July. After that if we need to open this we should be able to presuming they get his blood flow going appropriately. 09-02-2021 upon evaluation today patient appears to be doing about the same in regard to his wound this is starting to drain a lot more so it is definitely opening into something a little bit deeper. With that being said I do see signs of improvement to some degree and that there is not as much fluid buildup down in the base of the wound. I am very pleased with that. He is supposed to be having his vascular intervention on Monday after that hopefully will have better blood flow we will try to get this open to be getting draining completely and hopefully get this to resolve. He is not really wearing the compression right now because of discomfort that may again also be partially related to the fact that leg does not have sufficient blood flow to be able and heal this appropriately. Again once we get good blood  flow hopefully the pain will not be as much we will get better compression on him as well. 09-09-2021 patient underwent the balloon angioplasty of the left peroneal artery on 09-07-2021. It sounds like that the area of stenosis was resolved based on what I am seeing at this point in time. It looks like the Dr. Gwenlyn Saran was satisfied with the blood flow postprocedure which was awesome. The patient had no issues with complication during or following the procedure. He is actually present for evaluation here in the clinic today and the good news is the wound is already looking much better and has only been a couple of days. Fortunately he is not having a terrible amount of pain though is still having some discomfort at this time. I definitely think this is something that we can manage quite nicely. 7/26; the patient underwent a balloon angioplasty of the left peroneal artery on 09/07/2021 he tolerated this well he has a small open area remaining. Using silver alginate and 3  layer compression 8/3; patient presents for follow-up. We have been using collagen under 3 layer compression. He has no issues or complaints today. 8/11; patient presents for follow-up. We have been using collagen under 3 layer compression again he has no issues or complaints today. He reports improvement in wound healing. Patient History Information obtained from Patient. Family History Cancer - Father, Diabetes - Father,Paternal Grandparents, Heart Disease - Mother, Hypertension - Mother, Kidney Disease - Maternal Grandparents, Lung Disease - Father, Stroke - Father, No family history of Hereditary Spherocytosis, Seizures, Thyroid Problems, Tuberculosis. 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 Medical History Eyes Denies history of Cataracts, Glaucoma, Optic Neuritis Hematologic/Lymphatic Patient has history of Hemophilia - Borderline Denies history of Anemia, Human Immunodeficiency Virus, Lymphedema, Sickle Cell Disease Respiratory Patient has history of Asthma, Sleep Apnea Denies history of Aspiration, Chronic Obstructive Pulmonary Disease (COPD), Pneumothorax, Tuberculosis Cardiovascular Patient has history of Angina - Atypical, Congestive Heart Failure, Coronary Artery Disease, Hypertension, Peripheral Arterial Disease Denies history of Arrhythmia, Deep Vein Thrombosis, Hypotension, Myocardial Infarction, Peripheral Venous Disease, Phlebitis, Vasculitis Gastrointestinal Denies history of Cirrhosis , Colitis, Crohnoos, Hepatitis A, Hepatitis B, Hepatitis C Endocrine Patient has history of Type II Diabetes Denies history of Type I Diabetes Genitourinary Denies history of End Stage Renal Disease Immunological Denies history of Lupus Erythematosus, Raynaudoos, Scleroderma Integumentary (Skin) Denies history of History of Burn Musculoskeletal Patient has  history of Osteoarthritis Denies history of Gout, Rheumatoid Arthritis, Osteomyelitis Neurologic Patient has history of Neuropathy Denies history of Dementia, Quadriplegia, Paraplegia, Seizure Disorder Oncologic Denies history of Received Chemotherapy, Received Radiation Psychiatric Denies history of Anorexia/bulimia, Confinement Anxiety Hospitalization/Surgery History [facility] 09/2018 - Acute Resp Failure. - cardiac cath with stent 12/19/18. 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 Vitals Time Taken: 3:49 PM, Height: 70 in, Weight: 315 lbs, BMI: 45.2, Temperature: 98.7 F, Pulse: 74 bpm, Respiratory Rate: 17 breaths/min, Blood Pressure: 120/80 mmHg. Cardiovascular 2+ dorsalis pedis/posterior tibialis pulses. Psychiatric pleasant and cooperative. General Notes: Left leg: Small open wound with granulation tissue, fibrinous tissue and slough. No signs of surrounding infection. Good edema control. Integumentary (Hair, Skin) Wound #2 status is Open. Original cause of wound was Trauma. The date acquired was: 08/03/2021. The wound has been in treatment  7 weeks. The wound is located on the Left,Anterior Lower Leg. The wound measures 0.9cm length x 0.5cm width x 0.3cm depth; 0.353cm^2 area and 0.106cm^3 volume. There is Fat Layer (Subcutaneous Tissue) exposed. There is no tunneling or undermining noted. There is a large amount of serous drainage noted. The wound margin is distinct with the outline attached to the wound base. There is large (67-100%) red granulation within the wound bed. There is a small (1-33%) amount of necrotic tissue within the wound bed including Adherent Slough. Assessment Active Problems ICD-10 Non-pressure chronic ulcer of other part of left lower leg with fat layer  exposed Chronic venous hypertension (idiopathic) with ulcer and inflammation of bilateral lower extremity Other specified peripheral vascular diseases Type 2 diabetes mellitus with other skin ulcer Atherosclerotic heart disease of native coronary artery without angina pectoris Patient's wound has shown improvement in size and appearance since last clinic visit. I debrided nonviable tissue. I recommended continuing the course with collagen under 3 layer compression. Procedures Wound #2 Pre-procedure diagnosis of Wound #2 is an Arterial Insufficiency Ulcer located on the Left,Anterior Lower Leg .Severity of Tissue Pre Debridement is: Fat layer exposed. There was a Excisional Skin/Subcutaneous Tissue Debridement with a total area of 0.45 sq cm performed by Kalman Shan, DO. With the following instrument(s): Curette to remove Viable and Non-Viable tissue/material. Material removed includes Subcutaneous Tissue and Slough and after achieving pain control using Lidocaine. No specimens were taken. A time out was conducted at 16:12, prior to the start of the procedure. A Minimum amount of bleeding was controlled with Pressure. The procedure was tolerated well with a pain level of 0 throughout and a pain level of 0 following the procedure. Post Debridement Measurements: 0.9cm length x 0.5cm width x 0.3cm depth; 0.106cm^3 volume. Character of Wound/Ulcer Post Debridement is improved. Severity of Tissue Post Debridement is: Fat layer exposed. Post procedure Diagnosis Wound #2: Same as Pre-Procedure Pre-procedure diagnosis of Wound #2 is an Arterial Insufficiency Ulcer located on the Left,Anterior Lower Leg . There was a Three Layer Compression Therapy Procedure by Rhae Hammock, RN. Post procedure Diagnosis Wound #2: Same as Pre-Procedure Plan Follow-up Appointments: Return Appointment in 1 week. - 10/06/21 @ 1530 w/ Dr. Heber [place] and [place] Rm # 7 10/13/21 @ 1430 w/ Dr. Heber  and Allayne Butcher Rm #  9 Other: - -Do not use Ibuprofen while on Eliquis Bathing/ Shower/ Hygiene: May shower and wash wound with soap and water. - Use antibacterial soap, do not use peroxide or alcohol Edema Control - Lymphedema / SCD / Other: Elevate legs to the level of the heart or above for 30 minutes daily and/or when sitting, a frequency of: - throughout the day Avoid standing for long periods of time. Patient to wear own compression stockings every day. - Use 15-73mHg Compression stocking for right leg. Additional Orders / Instructions: Stop/Decrease Smoking Follow Nutritious Diet WOUND #2: - Lower Leg Wound Laterality: Left, Anterior Cleanser: Soap and Water 1 x Per Day/30 Days Discharge Instructions: May shower and wash wound with dial antibacterial soap and water prior to dressing change. Cleanser: Wound Cleanser 1 x Per Day/30 Days Discharge Instructions: Cleanse the wound with wound cleanser prior to applying a clean dressing using gauze sponges, not tissue or cotton balls. Peri-Wound Care: Triamcinolone 15 (g) 1 x Per Day/30 Days Discharge Instructions: Use triamcinolone 15 (g) as directed Peri-Wound Care: Sween Lotion (Moisturizing lotion) 1 x Per Day/30 Days Discharge Instructions: Apply moisturizing lotion as directed Topical: Triamcinolone 1 x Per Day/30  Days Discharge Instructions: Apply Triamcinolone as directed Prim Dressing: Promogran Prisma Matrix, 4.34 (sq in) (silver collagen) 1 x Per Day/30 Days ary Discharge Instructions: Moisten collagen with saline or hydrogel Secondary Dressing: ABD Pad, 8x10 (Generic) 1 x Per Day/30 Days Discharge Instructions: Apply over primary dressing as directed. Secondary Dressing: Woven Gauze Sponge, Non-Sterile 4x4 in 1 x Per Day/30 Days Discharge Instructions: Apply over primary dressing as directed. Com pression Wrap: ThreePress (3 layer compression wrap) 1 x Per Day/30 Days Discharge Instructions: Apply three layer compression as directed. 1. In  office sharp debridement 2. Collagen under 3 layer compression 3. Follow-up in 1 week Electronic Signature(s) Signed: 10/02/2021 1:41:40 PM By: Kalman Shan DO Entered By: Kalman Shan on 10/02/2021 13:32:59 -------------------------------------------------------------------------------- HxROS Details Patient Name: Date of Service: [place], [place] 10/01/2021 3:15 PM Medical Record Number: [id number] Patient Account Number: [id number] Date of Birth/Sex: Treating RN: [birth year] (61 y.o. Russell Collier Primary Care Provider: Alen Bleacher Other Clinician: Referring Provider: Treating Provider/Extender: Jannet Askew in Treatment: 7 Information Obtained From Patient Eyes Medical History: Negative for: Cataracts; Glaucoma; Optic Neuritis Hematologic/Lymphatic Medical History: Positive for: Hemophilia - Borderline Negative for: Anemia; Human Immunodeficiency Virus; Lymphedema; Sickle Cell Disease Respiratory Medical History: Positive for: Asthma; Sleep Apnea Negative for: Aspiration; Chronic Obstructive Pulmonary Disease (COPD); Pneumothorax; Tuberculosis Past Medical History Notes: Pulmonary Embolism Cardiovascular Medical History: Positive for: Angina - Atypical; Congestive Heart Failure; Coronary Artery Disease; Hypertension; Peripheral Arterial Disease Negative for: Arrhythmia; Deep Vein Thrombosis; Hypotension; Myocardial Infarction; Peripheral Venous Disease; Phlebitis; Vasculitis Past Medical History Notes: Hyperlipidemia, Bradycardia, 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 Gastrointestinal Medical History: Negative for: Cirrhosis ; Colitis; Crohns; Hepatitis A; Hepatitis B; Hepatitis C Endocrine Medical History: Positive for: Type II Diabetes Negative for: Type I Diabetes Treated with: Oral agents Blood sugar tested every day: No Genitourinary Medical History: Negative for: End Stage Renal Disease Immunological Medical  History: Negative for: Lupus Erythematosus; Raynauds; Scleroderma Integumentary (Skin) Medical History: Negative for: History of Burn Musculoskeletal Medical History: Positive for: Osteoarthritis Negative for: Gout; Rheumatoid Arthritis; Osteomyelitis Neurologic Medical History: Positive for: Neuropathy Negative for: Dementia; Quadriplegia; Paraplegia; Seizure Disorder Oncologic Medical History: Negative for: Received Chemotherapy; Received Radiation Psychiatric Medical History: Negative for: Anorexia/bulimia; Confinement Anxiety Immunizations Pneumococcal Vaccine: Received Pneumococcal Vaccination: Yes Received Pneumococcal Vaccination On or After 60th Birthday: No Implantable Devices Yes Hospitalization / Surgery History Type of Hospitalization/Surgery [facility] 09/2018 - Acute Resp Failure cardiac cath with stent 12/19/18 Family and Social History Cancer: Yes - Father; Diabetes: Yes - Father,Paternal Grandparents; Heart Disease: Yes - Mother; Hereditary Spherocytosis: No; Hypertension: Yes - Mother; Kidney Disease: Yes - Maternal Grandparents; Lung Disease: Yes - Father; Seizures: No; Stroke: Yes - Father; Thyroid Problems: No; Tuberculosis: No; Current every day smoker - Cigars 1 PPD; Marital Status - Divorced; Alcohol Use: Rarely; Drug Use: Prior History - Cocaine; Caffeine Use: Rarely; Financial Concerns: No; Food, Clothing or Shelter Needs: No; Support System Lacking: No; Transportation Concerns: No Electronic Signature(s) Signed: 10/02/2021 1:41:40 PM By: Kalman Shan DO Signed: 10/05/2021 4:24:40 PM By: Rhae Hammock RN Entered By: Kalman Shan on 10/02/2021 13:31:34 -------------------------------------------------------------------------------- SuperBill Details Patient Name: Date of Service: [place], [place] Col[place] 10/01/2021 Medical Record Number: [id number] Patient Account Number: [id number] Date of Birth/Sex: Treating RN: [birth year] (61 y.o. 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 E11.622 Type 2 diabetes mellitus with other skin ulcer I25.10 Atherosclerotic heart disease of native coronary artery without angina pectoris Facility Procedures CPT4 Code: [id number] Description: 81829 - DEB SUBQ TISSUE 20 SQ CM/< ICD-10 Diagnosis Description L97.822 Non-pressure chronic ulcer of other part of left lower leg with fat layer expo Modifier: sed Quantity: 1 Physician Procedures : CPT4 Code Description Modifier [id number] 11042 - WC PHYS SUBQ TISS 20 SQ CM ICD-10 Diagnosis Description L97.822 Non-pressure chronic ulcer of other part of left lower leg with fat layer exposed Quantity: 1 Electronic Signature(s) Signed: 10/02/2021 1:41:40 PM By: Kalman Shan DO Previous Signature: 10/01/2021 5:01:19 PM Version By: Rhae Hammock RN Entered By: Kalman Shan on 10/02/2021 13:33:33

## 2021-10-12 DIAGNOSIS — M5136 Other intervertebral disc degeneration, lumbar region: Secondary | ICD-10-CM | POA: Diagnosis not present

## 2021-10-13 ENCOUNTER — Encounter (HOSPITAL_BASED_OUTPATIENT_CLINIC_OR_DEPARTMENT_OTHER): Payer: Medicaid Other | Admitting: Internal Medicine

## 2021-10-13 DIAGNOSIS — E11622 Type 2 diabetes mellitus with other skin ulcer: Secondary | ICD-10-CM | POA: Diagnosis not present

## 2021-10-13 DIAGNOSIS — M5136 Other intervertebral disc degeneration, lumbar region: Secondary | ICD-10-CM | POA: Diagnosis not present

## 2021-10-13 DIAGNOSIS — I7389 Other specified peripheral vascular diseases: Secondary | ICD-10-CM | POA: Diagnosis not present

## 2021-10-13 DIAGNOSIS — L97822 Non-pressure chronic ulcer of other part of left lower leg with fat layer exposed: Secondary | ICD-10-CM | POA: Diagnosis not present

## 2021-10-13 DIAGNOSIS — I87312 Chronic venous hypertension (idiopathic) with ulcer of left lower extremity: Secondary | ICD-10-CM | POA: Diagnosis not present

## 2021-10-13 DIAGNOSIS — I87333 Chronic venous hypertension (idiopathic) with ulcer and inflammation of bilateral lower extremity: Secondary | ICD-10-CM | POA: Diagnosis not present

## 2021-10-13 NOTE — Progress Notes (Signed)
ERINN, HUSKINS ([id number]) Visit Report for 10/13/2021 Chief Complaint Document Details Patient Name: Date of Service: Russell Collier 10/13/2021 2:30 PM Medical Record Number: [id number] Patient Account Number: [id number] Date of Birth/Sex: Treating RN: 1960/09/15 (61 y.o. [name], [name] Primary Care Provider: Alen Bleacher Other Clinician: Referring Provider: Treating Provider/Extender: Jannet Askew in Treatment: 8 Information Obtained from: Patient Chief Complaint Left anterior LE Ulcer Electronic Signature(s) Signed: 10/13/2021 3:51:23 PM By: Kalman Shan DO Entered By: Kalman Shan on 10/13/2021 13:52:36 -------------------------------------------------------------------------------- HPI Details Patient Name: Date of Service: Russell Collier, Russell Collier 10/13/2021 2:30 PM Medical Record Number: [id number] Patient Account Number: [id number] Date of Birth/Sex: Treating RN: 17-Jun-1960 (61 y.o. [name], [name] Primary Care Provider: Alen Bleacher Other Clinician: Referring Provider: Treating Provider/Extender: Jannet Askew in Treatment: 8 History of Present Illness HPI Description: ADMISSION 09/14/2018 This is a 61 year old man who traumatized his left anterior tibia area on the bathtub. He describes this initially as a bruise that subsequently reopened. He has been seen in the ER several times and has had at least 3 courses of antibiotics including doxy and Keflex twice. He has been using Neosporin and peroxide on this. Past medical history includes obstructive sleep apnea, cellulitis, hypertension, coronary artery disease, peripheral neuropathy [nondiabetic], currently on a heart murmur for atypical chest pain and bradycardia. 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 ARTERIAL STUDIES done in March 2020 showed an ABI on the left of 0.93, TBI on the left slightly reduced at 0.42. On the right his ABI was 0.95 with a TBI  of 0.51 he has monophasic waveforms in the lower extremities bilaterally. Interpreted on the left this showing mild left lower extremity arterial disease as well as on the right. 09/25/18-Patient returns at 1 week to the clinic, noted that arterial studies done in March 2020. We have been using Iodoflex to the wound on the left leg, patient has significant degree of pain and declines to have any debridement 8/21; the patient has not been here in 3 weeks. Since he was last here he was admitted to hospital on 8/4. He was felt to have stable angina. He has since been to his cardiologist. I note that he has an EF of about 48%. He has a venous wound on the left anterior mid tibia area probably with lymphedema. We have been using Iodoflex. He does not tolerate debridement well. He tells me that his cardiologist has been adjusting his Lasix and then the wraps fall down he tries to pull them up. He is describing pain making it difficult for him to function. 9/3; patient is was hospitalized since he was last here with COPD exacerbation. Came in with Aquacel Ag on the wound. The wound actually looks improved. 9/17; patient was apparently in the ER again with breathing issues and blood pressure issues. He was not admitted. We are using Sorbact of the wound with nice improvement dimensions are better 10/1; the patient's wound is smaller and appears to have a reasonable healthy surface. We are using Sorbact 10/8; the wound is smaller although there is still an area with depth and necrotic debris at the base. We are using Sorbact 10/15; not much change in the wound area. We have been using Sorbact. 10/22; small wound area using endoform since last week 11/5; Russell Collier arrived today in a healed state. Left leg and wrapped. He still does not have compression stockings Readmission: 08-12-2021 upon evaluation today patient presents for evaluation here in the  clinic he has been seen last in 2020 at that point Dr.  Dellia Nims was the treating physician. Subsequently the patient tells me that he does have a history of peripheral vascular disease, chronic venous insufficiency, diabetes mellitus type 2, and coronary artery disease. Unfortunately he has had some issues with his right leg he recently has been [facility] and they did perform an arteriogram on the right leg I believe even putting a stent in. With that being said that leg seems to be doing well he mainly just needs a compression sock. In regard to the left leg which is the one in question he tells me the same day he had a surgery he hit the left leg on a piece of furniture on the anterior shin. This caused a area that seems to be a hematoma present at this point. With that being said the patient is having significant discomfort. He tells me. He has been taking ibuprofen which he should not be doing I discussed that with him today. Tylenol is really only should be taken being on the Eliquis. He also tells me that he has not really been put anything on other than a Band-Aid I really think he may benefit from some light compression I do not want to do anything too tight until we get a good review of his arterial flow as his ABIs are not good we got here in the office today was 0.75 on the left 0.65 on the right and to be honest he did not have palpable pulses that I could find. He does not appear to have had an arterial study since 2020 as noted above by Dr. Dellia Nims. 08-19-2021 upon evaluation today patient appears to be doing okay in regard to his wound although he does appear to have some fluid buildup underneath this area. Again I am a little reluctant to try to open this up right now due to the fact that the patient does not appear to have good blood flow based on our initial inspection during the clinic evaluation today. Again he does see his vascular surgeon today. We did not have any palpable pulses that we could find last time  I saw him and overall I think that he probably just needs to be seen in order to evaluate and see where things stand. 08-26-2021 upon evaluation today patient appears to be doing well currently in regard to his wound. Has been tolerating the dressing changes without complication. Fortunately we do not have a big open area but he does have some fluid underneath in the center this looks like it might start draining on its own soon which would be ideal. With that being said right now he has been using the Curlex and Coban wrap which she does tolerate okay. He is going for an arteriogram on 14 July. After that if we need to open this we should be able to presuming they get his blood flow going appropriately. 09-02-2021 upon evaluation today patient appears to be doing about the same in regard to his wound this is starting to drain a lot more so it is definitely opening into something a little bit deeper. With that being said I do see signs of improvement to some degree and that there is not as much fluid buildup down in the base of the wound. I am very pleased with that. He is supposed to be having his vascular intervention on Monday after that hopefully will have better  blood flow we will try to get this open to be getting draining completely and hopefully get this to resolve. He is not really wearing the compression right now because of discomfort that may again also be partially related to the fact that leg does not have sufficient blood flow to be able and heal this appropriately. Again once we get good blood flow hopefully the pain will not be as much we will get better compression on him as well. 09-09-2021 patient underwent the balloon angioplasty of the left peroneal artery on 09-07-2021. It sounds like that the area of stenosis was resolved based on what I am seeing at this point in time. It looks like the Dr. Gwenlyn Saran was satisfied with the blood flow postprocedure which was awesome. The patient had no  issues with complication during or following the procedure. He is actually present for evaluation here in the clinic today and the good news is the wound is already looking much better and has only been a couple of days. Fortunately he is not having a terrible amount of pain though is still having some discomfort at this time. I definitely think this is something that we can manage quite nicely. 7/26; the patient underwent a balloon angioplasty of the left peroneal artery on 09/07/2021 he tolerated this well he has a small open area remaining. Using silver alginate and 3 layer compression 8/3; patient presents for follow-up. We have been using collagen under 3 layer compression. He has no issues or complaints today. 8/11; patient presents for follow-up. We have been using collagen under 3 layer compression again he has no issues or complaints today. He reports improvement in wound healing. 8/15; patient presents for follow-up. We have been using collagen under 3 layer compression. He has no issues or complaints today. He has tolerated the wrap well. 8/22; patient presents for follow-up. We have been using collagen under the 3 layer compression. He has tolerated this well. He has no issues or complaints today. Electronic Signature(s) Signed: 10/13/2021 3:51:23 PM By: Kalman Shan DO Entered By: Kalman Shan on 10/13/2021 13:53:03 -------------------------------------------------------------------------------- Physical Exam Details Patient Name: Date of Service: Russell Collier 10/13/2021 2:30 PM Medical Record Number: [id number] Patient Account Number: [id number] Date of Birth/Sex: Treating RN: 1960/10/08 (61 y.o. [place] Noe Primary Care Provider: Alen Bleacher Other Clinician: Referring Provider: Treating Provider/Extender: Jannet Askew in Treatment: 8 Constitutional respirations regular, non-labored and within target range for  patient.. Cardiovascular 2+ dorsalis pedis/posterior tibialis pulses. Psychiatric pleasant and cooperative. Notes Left leg: T the anterior aspect there is a small open wound with granulation tissue and epithelization occurring to the distal portion. No signs of surrounding o infection. Good edema control. Electronic Signature(s) Signed: 10/13/2021 3:51:23 PM By: Kalman Shan DO Entered By: Kalman Shan on 10/13/2021 13:54:24 -------------------------------------------------------------------------------- Physician Orders Details Patient Name: Date of Service: [place], Russell Collier 10/13/2021 2:30 PM Medical Record Number: [id number] Patient Account Number: [id number] Date of Birth/Sex: Treating RN: 29-Sep-1960 (61 y.o. E[place] Noe Primary Care Provider: Alen Bleacher Other Clinician: Referring Provider: Treating Provider/Extender: Jannet Askew in Treatment: 8 Verbal / Phone Orders: No Diagnosis Coding Follow-up Appointments ppointment in 1 week. - 10/20/21 @ 3:15 w/ Dr. Heber Armstrong and Allayne Butcher Rm # 9 Return A Other: - -Do not use Ibuprofen while on Eliquis Bathing/ Shower/ Hygiene May shower and wash wound with soap and water. - Use antibacterial soap, do not use peroxide or alcohol Edema Control - Lymphedema /  SCD / Other Elevate legs to the level of the heart or above for 30 minutes daily and/or when sitting, a frequency of: - throughout the day Avoid standing for long periods of time. Patient to wear own compression stockings every day. - Use 15-16mHg Compression stocking for right leg. Additional Orders / Instructions Stop/Decrease Smoking Follow Nutritious Diet Wound Treatment Wound #2 - Lower Leg Wound Laterality: Left, Anterior Cleanser: Soap and Water 1 x Per Week/30 Days Discharge Instructions: May shower and wash wound with dial antibacterial soap and water prior to dressing change. Cleanser: Wound Cleanser 1 x Per Week/30  Days Discharge Instructions: Cleanse the wound with wound cleanser prior to applying a clean dressing using gauze sponges, not tissue or cotton balls. Peri-Wound Care: Triamcinolone 15 (g) 1 x Per Week/30 Days Discharge Instructions: Use triamcinolone 15 (g) as directed Peri-Wound Care: Sween Lotion (Moisturizing lotion) 1 x Per Week/30 Days Discharge Instructions: Apply moisturizing lotion as directed Prim Dressing: Endoform 2x2 in 1 x Per Week/30 Days ary Discharge Instructions: Moisten with saline Secondary Dressing: ABD Pad, 8x10 (Generic) 1 x Per Week/30 Days Discharge Instructions: Apply over primary dressing as directed. Secondary Dressing: Woven Gauze Sponge, Non-Sterile 4x4 in 1 x Per Week/30 Days Discharge Instructions: Apply over primary dressing as directed. Compression Wrap: ThreePress (3 layer compression wrap) 1 x Per Week/30 Days Discharge Instructions: Apply three layer compression as directed. Electronic Signature(s) Signed: 10/13/2021 3:41:21 PM By: BRhae HammockRN Signed: 10/13/2021 3:51:23 PM By: HKalman ShanDO Entered By: BRhae Hammockon 10/13/2021 13:59:23 -------------------------------------------------------------------------------- Problem List Details Patient Name: Date of Service: [place] Russell Collier 10/13/2021 2:30 PM Medical Record Number: [id number] Account Number: [id number] of Birth/Sex: Treating RN: 3July 08, 1962(61y.o. MBurnadette Pop [name] Primary Care Provider: NAlen BleacherOther Clinician: Referring Provider: Treating Provider/Extender: HJannet Askewin Treatment: 8 Active Problems ICD-10 Encounter Code Description Active Date MDM Diagnosis [id number]-pressure chronic ulcer of other part of left lower leg with fat layer exposed6/21/2023 No Yes I87.312 Chronic venous hypertension (idiopathic) with ulcer of left lower extremity 10/13/2021 No Yes I73.89 Other specified peripheral vascular diseases  08/12/2021 No Yes E11.622 Type 2 diabetes mellitus with other skin ulcer 08/12/2021 No Yes I25.10 Atherosclerotic heart disease of native coronary artery without angina pectoris 08/12/2021 No Yes Inactive Problems Resolved Problems Electronic Signature(s) Signed: 10/13/2021 3:51:23 PM By: HKalman ShanDO Entered By: HKalman Shanon 10/13/2021 13:57:44 -------------------------------------------------------------------------------- Progress Note Details Patient Name: Date of Service: [place] Russell Collier 10/13/2021 2:30 PM Medical Record Number: [id number] Account Number: [id number] of Birth/Sex: Treating RN: 309/24/1962(61y.o. MBurnadette Pop [name] Primary Care Provider: NAlen BleacherOther Clinician: Referring Provider: Treating Provider/Extender: HJannet Askewin Treatment: 8 Subjective Chief Complaint Information obtained from Patient Left anterior LE Ulcer History of Present Illness (HPI) ADMISSION 09/14/2018 This is a 61year old man who traumatized his left anterior tibia area on the bathtub. He describes this initially as a bruise that subsequently reopened. He has been seen in the ER several times and has had at least 3 courses of antibiotics including doxy and Keflex twice. He has been using Neosporin and peroxide on this. Past medical history includes obstructive sleep apnea, cellulitis, hypertension, coronary artery disease, peripheral neuropathy [nondiabetic], currently on a heart murmur for atypical chest pain and bradycardia. 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 ARTERIAL STUDIES done in March 2020 showed an ABI on the left of 0.93, TBI on the left slightly reduced at 0.42.  On the right his ABI was 0.95 with a TBI of 0.51 he has monophasic waveforms in the lower extremities bilaterally. Interpreted on the left this showing mild left lower extremity arterial disease as well as on the right. 09/25/18-Patient returns at 1 week to the  clinic, noted that arterial studies done in March 2020. We have been using Iodoflex to the wound on the left leg, patient has significant degree of pain and declines to have any debridement 8/21; the patient has not been here in 3 weeks. Since he was last here he was admitted to hospital on 8/4. He was felt to have stable angina. He has since been to his cardiologist. I note that he has an EF of about 48%. He has a venous wound on the left anterior mid tibia area probably with lymphedema. We have been using Iodoflex. He does not tolerate debridement well. He tells me that his cardiologist has been adjusting his Lasix and then the wraps fall down he tries to pull them up. He is describing pain making it difficult for him to function. 9/3; patient is was hospitalized since he was last here with COPD exacerbation. Came in with Aquacel Ag on the wound. The wound actually looks improved. 9/17; patient was apparently in the ER again with breathing issues and blood pressure issues. He was not admitted. We are using Sorbact of the wound with nice improvement dimensions are better 10/1; the patient's wound is smaller and appears to have a reasonable healthy surface. We are using Sorbact 10/8; the wound is smaller although there is still an area with depth and necrotic debris at the base. We are using Sorbact 10/15; not much change in the wound area. We have been using Sorbact. 10/22; small wound area using endoform since last week 11/5; Mr. Fines arrived today in a healed state. Left leg and wrapped. He still does not have compression stockings Readmission: 08-12-2021 upon evaluation today patient presents for evaluation here in the clinic he has been seen last in 2020 at that point Dr. Dellia Nims was the treating physician. Subsequently the patient tells me that he does have a history of peripheral vascular disease, chronic venous insufficiency, diabetes mellitus type 2, and coronary artery disease.  Unfortunately he has had some issues with his right leg he recently has been [facility] and they did perform an arteriogram on the right leg I believe even putting a stent in. With that being said that leg seems to be doing well he mainly just needs a compression sock. In regard to the left leg which is the one in question he tells me the same day he had a surgery he hit the left leg on a piece of furniture on the anterior shin. This caused a area that seems to be a hematoma present at this point. With that being said the patient is having significant discomfort. He tells me. He has been taking ibuprofen which he should not be doing I discussed that with him today. Tylenol is really only should be taken being on the Eliquis. He also tells me that he has not really been put anything on other than a Band-Aid I really think he may benefit from some light compression I do not want to do anything too tight until we get a good review of his arterial flow as his ABIs are not good we got here in the office today was 0.75 on the left 0.65 on the right and  to be honest he did not have palpable pulses that I could find. He does not appear to have had an arterial study since 2020 as noted above by Dr. Dellia Nims. 08-19-2021 upon evaluation today patient appears to be doing okay in regard to his wound although he does appear to have some fluid buildup underneath this area. Again I am a little reluctant to try to open this up right now due to the fact that the patient does not appear to have good blood flow based on our initial inspection during the clinic evaluation today. Again he does see his vascular surgeon today. We did not have any palpable pulses that we could find last time I saw him and overall I think that he probably just needs to be seen in order to evaluate and see where things stand. 08-26-2021 upon evaluation today patient appears to be doing well currently in regard to his wound.  Has been tolerating the dressing changes without complication. Fortunately we do not have a big open area but he does have some fluid underneath in the center this looks like it might start draining on its own soon which would be ideal. With that being said right now he has been using the Curlex and Coban wrap which she does tolerate okay. He is going for an arteriogram on 14 July. After that if we need to open this we should be able to presuming they get his blood flow going appropriately. 09-02-2021 upon evaluation today patient appears to be doing about the same in regard to his wound this is starting to drain a lot more so it is definitely opening into something a little bit deeper. With that being said I do see signs of improvement to some degree and that there is not as much fluid buildup down in the base of the wound. I am very pleased with that. He is supposed to be having his vascular intervention on Monday after that hopefully will have better blood flow we will try to get this open to be getting draining completely and hopefully get this to resolve. He is not really wearing the compression right now because of discomfort that may again also be partially related to the fact that leg does not have sufficient blood flow to be able and heal this appropriately. Again once we get good blood flow hopefully the pain will not be as much we will get better compression on him as well. 09-09-2021 patient underwent the balloon angioplasty of the left peroneal artery on 09-07-2021. It sounds like that the area of stenosis was resolved based on what I am seeing at this point in time. It looks like the Dr. Gwenlyn Saran was satisfied with the blood flow postprocedure which was awesome. The patient had no issues with complication during or following the procedure. He is actually present for evaluation here in the clinic today and the good news is the wound is already looking much better and has only been a couple of  days. Fortunately he is not having a terrible amount of pain though is still having some discomfort at this time. I definitely think this is something that we can manage quite nicely. 7/26; the patient underwent a balloon angioplasty of the left peroneal artery on 09/07/2021 he tolerated this well he has a small open area remaining. Using silver alginate and 3 layer compression 8/3; patient presents for follow-up. We have been using collagen under 3 layer compression. He has no issues or complaints today. 8/11; patient  presents for follow-up. We have been using collagen under 3 layer compression again he has no issues or complaints today. He reports improvement in wound healing. 8/15; patient presents for follow-up. We have been using collagen under 3 layer compression. He has no issues or complaints today. He has tolerated the wrap well. 8/22; patient presents for follow-up. We have been using collagen under the 3 layer compression. He has tolerated this well. He has no issues or complaints today. Patient History Information obtained from Patient. Family History Cancer - Father, Diabetes - Father,Paternal Grandparents, Heart Disease - Mother, Hypertension - Mother, Kidney Disease - Maternal Grandparents, Lung Disease - Father, Stroke - Father, No family history of Hereditary Spherocytosis, Seizures, Thyroid Problems, Tuberculosis. 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 Medical History Eyes Denies history of Cataracts, Glaucoma, Optic Neuritis Hematologic/Lymphatic Patient has history of Hemophilia - Borderline Denies history of Anemia, Human Immunodeficiency Virus, Lymphedema, Sickle Cell Disease Respiratory Patient has history of Asthma, Sleep Apnea Denies history of Aspiration, Chronic Obstructive Pulmonary Disease (COPD), Pneumothorax,  Tuberculosis Cardiovascular Patient has history of Angina - Atypical, Congestive Heart Failure, Coronary Artery Disease, Hypertension, Peripheral Arterial Disease Denies history of Arrhythmia, Deep Vein Thrombosis, Hypotension, Myocardial Infarction, Peripheral Venous Disease, Phlebitis, Vasculitis Gastrointestinal Denies history of Cirrhosis , Colitis, Crohnoos, Hepatitis A, Hepatitis B, Hepatitis C Endocrine Patient has history of Type II Diabetes Denies history of Type I Diabetes Genitourinary Denies history of End Stage Renal Disease Immunological Denies history of Lupus Erythematosus, Raynaudoos, Scleroderma Integumentary (Skin) Denies history of History of Burn Musculoskeletal Patient has history of Osteoarthritis Denies history of Gout, Rheumatoid Arthritis, Osteomyelitis Neurologic Patient has history of Neuropathy Denies history of Dementia, Quadriplegia, Paraplegia, Seizure Disorder Oncologic Denies history of Received Chemotherapy, Received Radiation Psychiatric Denies history of Anorexia/bulimia, Confinement Anxiety Hospitalization/Surgery History [facility] Pont[facility] 09/2018 - Acute Resp Failure. - cardiac cath with stent 12/19/18. 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 Vitals Time Taken: 1:35 PM, Height: 70 in, Weight: 315 lbs, BMI: 45.2, Temperature: 98.2 F, Pulse: 71 bpm, Respiratory Rate: 17 breaths/min, Blood Pressure: 146/74 mmHg. Cardiovascular 2+ dorsalis pedis/posterior tibialis pulses. Psychiatric pleasant and cooperative. General Notes: Left leg: T the anterior aspect there is a small open wound with granulation tissue and epithelization occurring to the distal portion. No signs of o surrounding infection. Good edema control. Integumentary (Hair, Skin) Wound  #2 status is Open. Original cause of wound was Trauma. The date acquired was: 08/03/2021. The wound has been in treatment 8 weeks. The wound is located on the Left,Anterior Lower Leg. The wound measures 0.5cm length x 0.3cm width x 0.1cm depth; 0.118cm^2 area and 0.012cm^3 volume. There is Fat Layer (Subcutaneous Tissue) exposed. There is no tunneling noted, however, there is undermining starting at 12:00 and ending at 2:00 with a maximum distance of 0.2cm. There is a large amount of serous drainage noted. The wound margin is distinct with the outline attached to the wound base. There is small (1-33%) red granulation within the wound bed. There is a large (67-100%) amount of necrotic tissue within the wound bed including Adherent Slough. 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 No need for debridement today. I recommended switching the dressing to endoform and continuing compression therapy. Follow-up in 1 week. Procedures Wound #2 Pre-procedure diagnosis of Wound #2 is an Arterial Insufficiency Ulcer located on the Left,Anterior Lower Leg . There was a Three Layer Compression Therapy Procedure by Rhae Hammock, RN. Post procedure Diagnosis Wound #2: Same as Pre-Procedure Plan Follow-up Appointments: Return Appointment in 1 week. - 10/13/21 @ 1430 w/ Dr. Heber [place] and Allayne Butcher Rm # 9 Other: - -Do not use Ibuprofen while on Eliquis Bathing/ Shower/ Hygiene: May shower and wash wound with soap and water. - Use antibacterial soap, do not use peroxide or alcohol Edema Control - Lymphedema / SCD  / Other: Elevate legs to the level of the heart or above for 30 minutes daily and/or when sitting, a frequency of: - throughout the day Avoid standing for long periods of time. Patient to wear own compression stockings every day. - Use 15-64mHg Compression stocking for right leg. Additional Orders / Instructions: Stop/Decrease Smoking Follow Nutritious Diet WOUND #2: - Lower Leg Wound Laterality: Left, Anterior Cleanser: Soap and Water 1 x Per Week/30 Days Discharge Instructions: May shower and wash wound with dial antibacterial soap and water prior to dressing change. Cleanser: Wound Cleanser 1 x Per Week/30 Days Discharge Instructions: Cleanse the wound with wound cleanser prior to applying a clean dressing using gauze sponges, not tissue or cotton balls. Peri-Wound Care: Triamcinolone 15 (g) 1 x Per Week/30 Days Discharge Instructions: Use triamcinolone 15 (g) as directed Peri-Wound Care: Sween Lotion (Moisturizing lotion) 1 x Per Week/30 Days Discharge Instructions: Apply moisturizing lotion as directed Prim Dressing: Endoform 2x2 in 1 x Per Week/30 Days ary Discharge Instructions: Moisten with saline Secondary Dressing: ABD Pad, 8x10 (Generic) 1 x Per Week/30 Days Discharge Instructions: Apply over primary dressing as directed. Secondary Dressing: Woven Gauze Sponge, Non-Sterile 4x4 in 1 x Per Week/30 Days Discharge Instructions: Apply over primary dressing as directed. Com pression Wrap: ThreePress (3 layer compression wrap) 1 x Per Week/30 Days Discharge Instructions: Apply three layer compression as directed. 1. Endoform under 3 layer compression 2. Follow-up in 1 week Electronic Signature(s) Signed: 10/13/2021 3:51:23 PM By: HKalman ShanDO Entered By: HKalman Shanon 10/13/2021 13:59:53 -------------------------------------------------------------------------------- HxROS Details Patient Name: Date of Service: TA YDarcus Austin Russell Collier 10/13/2021 2:30 PM Medical Record  Number: [id number] Account Number: [id number] of Birth/Sex: Treating RN: [birth year](61y.o. MBurnadette Pop [name] Primary Care Provider: NAlen BleacherOther Clinician: Referring Provider: Treating Provider/Extender: HJannet Askewin Treatment: 8 Information Obtained From Patient Eyes Medical History: Negative for: Cataracts; Glaucoma; Optic Neuritis Hematologic/Lymphatic Medical History: Positive for: Hemophilia - Borderline Negative for: Anemia; Human Immunodeficiency Virus; Lymphedema; Sickle Cell Disease Respiratory Medical History: Positive for: Asthma; Sleep Apnea Negative for: Aspiration; Chronic Obstructive Pulmonary Disease (COPD); Pneumothorax; Tuberculosis Past Medical History Notes: Pulmonary Embolism Cardiovascular Medical History: Positive for: Angina - Atypical; Congestive Heart Failure; Coronary Artery Disease; Hypertension; Peripheral Arterial Disease Negative for: Arrhythmia; Deep Vein Thrombosis; Hypotension; Myocardial Infarction; Peripheral Venous Disease; Phlebitis; Vasculitis Past Medical History Notes: Hyperlipidemia, Bradycardia, 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 Gastrointestinal Medical History: Negative for: Cirrhosis ; Colitis; Crohns; Hepatitis A; Hepatitis B; Hepatitis C Endocrine Medical History: Positive for: Type II Diabetes Negative for: Type I Diabetes Treated with:  Oral agents Blood sugar tested every day: No Genitourinary Medical History: Negative for: End Stage Renal Disease Immunological Medical History: Negative for: Lupus Erythematosus; Raynauds; Scleroderma Integumentary (Skin) Medical History: Negative for: History of Burn Musculoskeletal Medical History: Positive for: Osteoarthritis Negative for: Gout; Rheumatoid Arthritis; Osteomyelitis Neurologic Medical History: Positive for: Neuropathy Negative for: Dementia; Quadriplegia; Paraplegia; Seizure Disorder Oncologic Medical  History: Negative for: Received Chemotherapy; Received Radiation Psychiatric Medical History: Negative for: Anorexia/bulimia; Confinement Anxiety Immunizations Pneumococcal Vaccine: Received Pneumococcal Vaccination: Yes Received Pneumococcal Vaccination On or After 60th Birthday: No Implantable Devices Yes Hospitalization / Surgery History Type of Hospitalization/Surgery [facility] 09/2018 - Acute Resp Failure cardiac cath with stent 12/19/18 Family and Social History Cancer: Yes - Father; Diabetes: Yes - Father,Paternal Grandparents; Heart Disease: Yes - Mother; Hereditary Spherocytosis: No; Hypertension: Yes - Mother; Kidney Disease: Yes - Maternal Grandparents; Lung Disease: Yes - Father; Seizures: No; Stroke: Yes - Father; Thyroid Problems: No; Tuberculosis: No; Current every day smoker - Cigars 1 PPD; Marital Status - Divorced; Alcohol Use: Rarely; Drug Use: Prior History - Cocaine; Caffeine Use: Rarely; Financial Concerns: No; Food, Clothing or Shelter Needs: No; Support System Lacking: No; Transportation Concerns: No Electronic Signature(s) Signed: 10/13/2021 3:41:21 PM By: Rhae Hammock RN Signed: 10/13/2021 3:51:23 PM By: Kalman Shan DO Entered By: Kalman Shan on 10/13/2021 13:53:35 -------------------------------------------------------------------------------- SuperBill Details Patient Name: Date of Service: Russell Collier, Russell Collier 10/13/2021 Medical Record Number: [id number] Patient Account Number: [id number] Date of Birth/Sex: Treating RN: 1960/03/24 (61 y.o. [name], [name] Primary Care Provider: Alen Bleacher Other Clinician: Referring Provider: Treating Provider/Extender: Jannet Askew in Treatment: 8 Diagnosis Coding ICD-10 Codes Code Description [phone] Non-pressure chronic ulcer of other part of left lower leg with fat layer exposed I87.312 Chronic venous hypertension (idiopathic) with ulcer of left lower extremity I73.89  Other specified peripheral vascular diseases E11.622 Type 2 diabetes mellitus with other skin ulcer I25.10 Atherosclerotic heart disease of native coronary artery without angina pectoris Facility Procedures CPT4 Code: [id number] Description: (Facility Use Only) [phone] - [place] LWR LT LEG Modifier: Quantity: 1 Physician Procedures : CPT4 Code Description Modifier [id number] - WC PHYS LEVEL 3 - EST PT ICD-10 Diagnosis Description L97.822 Non-pressure chronic ulcer of other part of left lower leg with fat layer exposed I87.312 Chronic venous hypertension (idiopathic) with ulcer  of left lower extremity I73.89 Other specified peripheral vascular diseases E11.622 Type 2 diabetes mellitus with other skin ulcer Quantity: 1 Electronic Signature(s) Signed: 10/13/2021 3:51:23 PM By: Kalman Shan DO Signed: 10/13/2021 3:51:23 PM By: Kalman Shan DO Entered By: Kalman Shan on 10/13/2021 14:00:38

## 2021-10-13 NOTE — Progress Notes (Signed)
ESHAWN, COOR ([id number]) Visit Report for 10/13/2021 Arrival Information Details Patient Name: Date of Service: Russell Collier 10/13/2021 2:30 PM Medical Record Number: [id number] Patient Account Number: [id number] Date of Birth/Sex: Treating RN: 01-21-1961 (61 y.o. Russell Collier, Russell Primary Care [name]: Alen Bleacher Other Clinician: Referring [name]: Treating [name]/Extender: Jannet Askew in Treatment: 8 Visit Information History Since Last Visit Added or deleted any medications: No Patient Arrived: [name] Any new allergies or adverse reactions: No Arrival Time: 13:35 Had a fall or experienced change in No Accompanied By: self activities of daily living that may affect Transfer Assistance: None risk of falls: Patient Identification Verified: Yes Signs or symptoms of abuse/neglect since last visito No Secondary Verification Process Completed: Yes Hospitalized since last visit: No Patient Requires Transmission-Based Precautions: No Implantable device outside of the clinic excluding No Patient Has Alerts: Yes cellular tissue based products placed in the center Patient Alerts: Patient on Blood Thinner since last visit: Has Dressing in Place as Prescribed: Yes Has Compression in Place as Prescribed: Yes Pain Present Now: No Electronic Signature(s) Signed: 10/13/2021 3:41:21 PM By: Rhae Hammock RN Entered By: Rhae Hammock on 10/13/2021 13:35:23 -------------------------------------------------------------------------------- Compression Therapy Details Patient Name: Date of Service: Russell Collier, [name] 10/13/2021 2:30 PM Medical Record Number: [id number] Patient Account Number: [id number] Date of Birth/Sex: Treating RN: 04-07-1960 (61 y.o. Russell Collier, Russell Primary Care [name]: Alen Bleacher Other Clinician: Referring [name]: Treating [name]/Extender: Jannet Askew in Treatment: 8 Compression Therapy  Performed for Wound Assessment: Wound #2 Left,Anterior Lower Leg Performed By: Clinician Rhae Hammock, RN Compression Type: Three Layer Post Procedure Diagnosis Same as Pre-procedure Electronic Signature(s) Signed: 10/13/2021 3:41:21 PM By: Rhae Hammock RN Entered By: Rhae Hammock on 10/13/2021 13:43:50 -------------------------------------------------------------------------------- Encounter Discharge Information Details Patient Name: Date of Service: [name], [name] 10/13/2021 2:30 PM Medical Record Number: [id number] Patient Account Number: [id number] Date of Birth/Sex: Treating RN: 11/14/1960 (61 y.o. Russell Collier, Russell Primary Care [name]: Alen Bleacher Other Clinician: Referring [name]: Treating [name]/Extender: Jannet Askew in Treatment: 8 Encounter Discharge Information Items Discharge Condition: Stable Ambulatory Status: Walker Discharge Destination: Home Transportation: Private Auto Accompanied By: self Schedule Follow-up Appointment: Yes Clinical Summary of Care: Patient Declined Electronic Signature(s) Signed: 10/13/2021 3:41:21 PM By: Rhae Hammock RN Entered By: Rhae Hammock on 10/13/2021 13:46:55 -------------------------------------------------------------------------------- Lower Extremity Assessment Details Patient Name: Date of Service: [name], [name] 10/13/2021 2:30 PM Medical Record Number: [id number] Patient Account Number: [id number] Date of Birth/Sex: Treating RN: October 18, 1960 (62 y.o. Russell Collier, Russell Primary Care [name]: Alen Bleacher Other Clinician: Referring [name]: Treating [name]/Extender: Jannet Askew in Treatment: 8 Edema Assessment Assessed: [name]: Yes] [name]: No] Edema: [Left: Ye] [Right: s] Calf Left: Right: Point of Measurement: 37 cm From Medial Instep 41 cm Ankle Left: Right: Point of Measurement: 10 cm From Medial Instep 23 cm Vascular  Assessment Pulses: Dorsalis Pedis Palpable: [Left:Yes] Posterior Tibial Palpable: [Left:Yes] Electronic Signature(s) Signed: 10/13/2021 3:41:21 PM By: Rhae Hammock RN Entered By: Rhae Hammock on 10/13/2021 13:36:57 -------------------------------------------------------------------------------- Multi Wound Chart Details Patient Name: Date of Service: Russell Collier 10/13/2021 2:30 PM Medical Record Number: [id number] Patient Account Number: [id number] Date of Birth/Sex: Treating RN: 10-Jul-1960 (61 y.o. Russell Collier, Russell Primary Care [name]: Alen Bleacher Other Clinician: Referring [name]: Treating [name]/Extender: Jannet Askew in Treatment: 8 Vital Signs Height(in): 70 Pulse(bpm): 71 Weight(lbs): 315 Blood Pressure(mmHg): 146/74 Body Mass Index(BMI): 45.2 Temperature(F): 98.2  Respiratory Rate(breaths/min): 17 Photos: [N/A:N/A] Left, Anterior Lower Leg N/A N/A Wound Location: Trauma N/A N/A Wounding Event: Arterial Insufficiency Ulcer N/A N/A Primary Etiology: Hemophilia, Asthma, Sleep Apnea, N/A N/A Comorbid History: Angina, Congestive Heart Failure, Coronary Artery Disease, Hypertension, Peripheral Arterial Disease, Type II Diabetes, Osteoarthritis, Neuropathy 08/03/2021 N/A N/A Date Acquired: 8 N/A N/A Weeks of Treatment: Open N/A N/A Wound Status: No N/A N/A Wound Recurrence: Yes N/A N/A Clustered Wound: 1 N/A N/A Clustered Quantity: 0.5x0.3x0.1 N/A N/A Measurements L x W x D (cm) 0.118 N/A N/A A (cm) : rea 0.012 N/A N/A Volume (cm) : 93.30% N/A N/A % Reduction in A rea: 96.60% N/A N/A % Reduction in Volume: 12 Starting Position 1 (o'clock): 2 Ending Position 1 (o'clock): 0.2 Maximum Distance 1 (cm): Yes N/A N/A Undermining: Full Thickness Without Exposed N/A N/A Classification: Support Structures Large N/A N/A Exudate Amount: Serous N/A N/A Exudate Type: amber N/A N/A Exudate  Color: Distinct, outline attached N/A N/A Wound Margin: Small (1-33%) N/A N/A Granulation Amount: Red N/A N/A Granulation Quality: Large (67-100%) N/A N/A Necrotic Amount: Fat Layer (Subcutaneous Tissue): Yes N/A N/A Exposed Structures: Fascia: No Tendon: No Muscle: No Joint: No Bone: No Large (67-100%) N/A N/A Epithelialization: Compression Therapy N/A N/A Procedures Performed: Treatment Notes Wound #2 (Lower Leg) Wound Laterality: Left, Anterior Cleanser Soap and Water Discharge Instruction: May shower and wash wound with dial antibacterial soap and water prior to dressing change. Wound Cleanser Discharge Instruction: Cleanse the wound with wound cleanser prior to applying a clean dressing using gauze sponges, not tissue or cotton balls. Peri-Wound Care Triamcinolone 15 (g) Discharge Instruction: Use triamcinolone 15 (g) as directed Sween Lotion (Moisturizing lotion) Discharge Instruction: Apply moisturizing lotion as directed Topical Primary Dressing Endoform 2x2 in Discharge Instruction: Moisten with saline Secondary Dressing ABD Pad, 8x10 Discharge Instruction: Apply over primary dressing as directed. Woven Gauze Sponge, Non-Sterile 4x4 in Discharge Instruction: Apply over primary dressing as directed. Secured With Compression Wrap ThreePress (3 layer compression wrap) Discharge Instruction: Apply three layer compression as directed. Compression Stockings Add-Ons Electronic Signature(s) Signed: 10/13/2021 3:41:21 PM By: Rhae Hammock RN Signed: 10/13/2021 3:51:23 PM By: Kalman Shan DO Entered By: Kalman Shan on 10/13/2021 13:52:25 -------------------------------------------------------------------------------- Multi-Disciplinary Care Plan Details Patient Name: Date of Service: Russell Collier, [name] 10/13/2021 2:30 PM Medical Record Number: [id number] Patient Account Number: [id number] Date of Birth/Sex: Treating RN: 10/25/1960 (61 y.o. Russell Collier, Russell Primary Care Koston Hennes: Alen Bleacher Other Clinician: Referring Shian Goodnow: Treating Annabell Oconnor/Extender: Jannet Askew in Treatment: 8 Active Inactive Wound/Skin Impairment Nursing Diagnoses: Impaired tissue integrity Goals: Patient/caregiver will verbalize understanding of skin care regimen Date Initiated: 08/12/2021 Date Inactivated: 09/16/2021 Target Resolution Date: 09/09/2021 Goal Status: Met Ulcer/skin breakdown will have a volume reduction of 30% by week 4 Date Initiated: 08/12/2021 Target Resolution Date: 10/24/2021 Goal Status: Active Interventions: Assess patient/caregiver ability to obtain necessary supplies Assess patient/caregiver ability to perform ulcer/skin care regimen upon admission and as needed Assess ulceration(s) every visit Provide education on smoking Provide education on ulcer and skin care Treatment Activities: Smoking cessation education : 08/12/2021 Topical wound management initiated : 08/12/2021 Notes: Electronic Signature(s) Signed: 10/13/2021 3:41:21 PM By: Rhae Hammock RN Entered By: Rhae Hammock on 10/13/2021 13:45:59 -------------------------------------------------------------------------------- Pain Assessment Details Patient Name: Date of Service: Russell Collier, [name] 10/13/2021 2:30 PM Medical Record Number: [id number] Patient Account Number: [id number] Date of Birth/Sex: Treating RN: Nov 05, 1960 (61 y.o. Erie Noe Primary Care Dagan Heinz: Alen Bleacher Other Clinician: Referring Alynn Ellithorpe:  Treating Anabelen Kaminsky/Extender: Jannet Askew in Treatment: 8 Active Problems Location of Pain Severity and Description of Pain Patient Has Paino No Site Locations Pain Management and Medication Current Pain Management: Electronic Signature(s) Signed: 10/13/2021 3:41:21 PM By: Rhae Hammock RN Entered By: Rhae Hammock on 10/13/2021  13:36:51 -------------------------------------------------------------------------------- Patient/Caregiver Education Details Patient Name: Date of Service: Russell Collier 8/22/2023andnbsp2:30 PM Medical Record Number: [id number] Patient Account Number: [id number] Date of Birth/Gender: Treating RN: [birth year] (61 y.o. Erie Noe Primary Care Physician: Alen Bleacher Other Clinician: Referring Physician: Treating Physician/Extender: Jannet Askew in Treatment: 8 Education Assessment Education Provided To: Patient Education Topics Provided Wound/Skin Impairment: Methods: Explain/Verbal Responses: Reinforcements needed, State content correctly Electronic Signature(s) Signed: 10/13/2021 3:41:21 PM By: Rhae Hammock RN Entered By: Rhae Hammock on 10/13/2021 13:46:13 -------------------------------------------------------------------------------- Wound Assessment Details Patient Name: Date of Service: Russell Collier, [name] 10/13/2021 2:30 PM Medical Record Number: [id number] Patient Account Number: [id number] Date of Birth/Sex: Treating RN: 04-17-1960 (61 y.o. Russell Collier, Russell Primary Care Minsa Weddington: Alen Bleacher Other Clinician: Referring Verdell Dykman: Treating Lilymae Swiech/Extender: Jannet Askew in Treatment: 8 Wound Status Wound Number: 2 Primary Arterial Insufficiency Ulcer Etiology: Wound Location: Left, Anterior Lower Leg Wound Open Wounding Event: Trauma Status: Date Acquired: 08/03/2021 Comorbid Hemophilia, Asthma, Sleep Apnea, Angina, Congestive Heart Weeks Of Treatment: 8 History: Failure, Coronary Artery Disease, Hypertension, Peripheral Arterial Clustered Wound: Yes Disease, Type II Diabetes, Osteoarthritis, Neuropathy Photos Wound Measurements Length: (cm) 0.5 Width: (cm) 0.3 Depth: (cm) 0.1 Clustered Quantity: 1 Area: (cm) 0.118 Volume: (cm) 0.012 Wound Description Classification: Full  Thickness Without Exposed Support Structures Wound Margin: Distinct, outline attached Exudate Amount: Large Exudate Type: Serous Exudate Color: amber Foul Odor After Cleansing: Slough/Fibrino % Reduction in Area: 93.3% % Reduction in Volume: 96.6% Epithelialization: Large (67-100%) Tunneling: No Undermining: Yes Starting Position (o'clock): 12 Ending Position (o'clock): 2 Maximum Distance: (cm) 0.2 No Yes Wound Bed Granulation Amount: Small (1-33%) Exposed Structure Granulation Quality: Red Fascia Exposed: No Necrotic Amount: Large (67-100%) Fat Layer (Subcutaneous Tissue) Exposed: Yes Necrotic Quality: Adherent Slough Tendon Exposed: No Muscle Exposed: No Joint Exposed: No Bone Exposed: No Treatment Notes Wound #2 (Lower Leg) Wound Laterality: Left, Anterior Cleanser Soap and Water Discharge Instruction: May shower and wash wound with dial antibacterial soap and water prior to dressing change. Wound Cleanser Discharge Instruction: Cleanse the wound with wound cleanser prior to applying a clean dressing using gauze sponges, not tissue or cotton balls. Peri-Wound Care Triamcinolone 15 (g) Discharge Instruction: Use triamcinolone 15 (g) as directed Sween Lotion (Moisturizing lotion) Discharge Instruction: Apply moisturizing lotion as directed Topical Primary Dressing Endoform 2x2 in Discharge Instruction: Moisten with saline Secondary Dressing ABD Pad, 8x10 Discharge Instruction: Apply over primary dressing as directed. Woven Gauze Sponge, Non-Sterile 4x4 in Discharge Instruction: Apply over primary dressing as directed. Secured With Compression Wrap ThreePress (3 layer compression wrap) Discharge Instruction: Apply three layer compression as directed. Compression Stockings Add-Ons Electronic Signature(s) Signed: 10/13/2021 3:41:21 PM By: Rhae Hammock RN Signed: 10/13/2021 4:08:49 PM By: Erenest Blank Entered By: Erenest Blank on 10/13/2021  13:45:24 -------------------------------------------------------------------------------- Vitals Details Patient Name: Date of Service: Russell Collier, [name] 10/13/2021 2:30 PM Medical Record Number: [id number] Patient Account Number: [id number] Date of Birth/Sex: Treating RN: 1960-09-28 (61 y.o. Russell Collier, Russell Primary Care Tayla Panozzo: Alen Bleacher Other Clinician: Referring Becci Batty: Treating Ronald Vinsant/Extender: Jannet Askew in Treatment: 8 Vital Signs Time Taken: 13:35 Temperature (F): 98.2 Height (in): 70 Pulse (bpm):  71 Weight (lbs): 315 Respiratory Rate (breaths/min): 17 Body Mass Index (BMI): 45.2 Blood Pressure (mmHg): 146/74 Reference Range: 80 - 120 mg / dl Electronic Signature(s) Signed: 10/13/2021 3:41:21 PM By: Rhae Hammock RN Entered By: Rhae Hammock on 10/13/2021 13:36:42

## 2021-10-14 DIAGNOSIS — M5136 Other intervertebral disc degeneration, lumbar region: Secondary | ICD-10-CM | POA: Diagnosis not present

## 2021-10-15 DIAGNOSIS — M5136 Other intervertebral disc degeneration, lumbar region: Secondary | ICD-10-CM | POA: Diagnosis not present

## 2021-10-16 ENCOUNTER — Other Ambulatory Visit (HOSPITAL_COMMUNITY): Payer: Self-pay

## 2021-10-16 DIAGNOSIS — M5136 Other intervertebral disc degeneration, lumbar region: Secondary | ICD-10-CM | POA: Diagnosis not present

## 2021-10-19 ENCOUNTER — Other Ambulatory Visit: Payer: Self-pay | Admitting: *Deleted

## 2021-10-19 DIAGNOSIS — M5136 Other intervertebral disc degeneration, lumbar region: Secondary | ICD-10-CM | POA: Diagnosis not present

## 2021-10-19 NOTE — Patient Outreach (Signed)
  Medicaid Managed Care   Unsuccessful Attempt Note   10/19/2021 Name: Russell Collier MRN: [id number] DOB: 1960/05/01  Referred by: Alen Bleacher, MD Reason for referral : High Risk Managed Medicaid (Unsuccessful RNCM follow up telephone outreach)   An unsuccessful telephone outreach was attempted today. The patient was referred to the case management team for assistance with care management and care coordination.    Follow Up Plan: A HIPAA compliant phone message was left for the patient providing contact information and requesting a return call.    [name] RN, BSN [place]  [employer] manager

## 2021-10-19 NOTE — Patient Instructions (Signed)
Visit Information  [name]  - as a part of your Medicaid benefit, you are eligible for care management and care coordination services at no cost or copay. I was unable to reach you by phone today but would be happy to help you with your health related needs. Please feel free to call me @ [phone].   A member of the Managed Medicaid care management team will reach out to you again over the next 14 days.   [name] RN, BSN [place]  [employer] manager

## 2021-10-20 ENCOUNTER — Encounter (HOSPITAL_BASED_OUTPATIENT_CLINIC_OR_DEPARTMENT_OTHER): Payer: Medicaid Other | Admitting: Internal Medicine

## 2021-10-20 DIAGNOSIS — L97822 Non-pressure chronic ulcer of other part of left lower leg with fat layer exposed: Secondary | ICD-10-CM | POA: Diagnosis not present

## 2021-10-20 DIAGNOSIS — I87333 Chronic venous hypertension (idiopathic) with ulcer and inflammation of bilateral lower extremity: Secondary | ICD-10-CM | POA: Diagnosis not present

## 2021-10-20 DIAGNOSIS — M5136 Other intervertebral disc degeneration, lumbar region: Secondary | ICD-10-CM | POA: Diagnosis not present
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
## 2021-10-21 ENCOUNTER — Ambulatory Visit (HOSPITAL_COMMUNITY)
Admission: RE | Admit: 2021-10-21 | Discharge: 2021-10-21 | Disposition: A | Discharge status: Home / Self Care | Payer: Medicaid Other | Source: Ambulatory Visit | Attending: Family Medicine | Admitting: Family Medicine

## 2021-10-21 ENCOUNTER — Other Ambulatory Visit: Payer: Self-pay | Admitting: Family Medicine

## 2021-10-21 ENCOUNTER — Other Ambulatory Visit (HOSPITAL_COMMUNITY): Payer: Self-pay

## 2021-10-21 ENCOUNTER — Encounter (HOSPITAL_COMMUNITY): Payer: Self-pay

## 2021-10-21 VITALS — BP 136/82 | HR 65 | Ht 70.0 in | Wt 307.0 lb

## 2021-10-21 DIAGNOSIS — G63 Polyneuropathy in diseases classified elsewhere: Secondary | ICD-10-CM | POA: Diagnosis not present

## 2021-10-21 DIAGNOSIS — I25110 Atherosclerotic heart disease of native coronary artery with unstable angina pectoris: Secondary | ICD-10-CM

## 2021-10-21 DIAGNOSIS — Z955 Presence of coronary angioplasty implant and graft: Secondary | ICD-10-CM | POA: Insufficient documentation

## 2021-10-21 DIAGNOSIS — I493 Ventricular premature depolarization: Secondary | ICD-10-CM | POA: Insufficient documentation

## 2021-10-21 DIAGNOSIS — E854 Organ-limited amyloidosis: Secondary | ICD-10-CM | POA: Insufficient documentation

## 2021-10-21 DIAGNOSIS — E851 Neuropathic heredofamilial amyloidosis: Secondary | ICD-10-CM | POA: Diagnosis not present

## 2021-10-21 DIAGNOSIS — Z79899 Other long term (current) drug therapy: Secondary | ICD-10-CM | POA: Diagnosis not present

## 2021-10-21 DIAGNOSIS — I110 Hypertensive heart disease with heart failure: Secondary | ICD-10-CM | POA: Diagnosis not present

## 2021-10-21 DIAGNOSIS — Z7984 Long term (current) use of oral hypoglycemic drugs: Secondary | ICD-10-CM | POA: Diagnosis not present

## 2021-10-21 DIAGNOSIS — E669 Obesity, unspecified: Secondary | ICD-10-CM | POA: Diagnosis not present

## 2021-10-21 DIAGNOSIS — E119 Type 2 diabetes mellitus without complications: Secondary | ICD-10-CM | POA: Diagnosis not present

## 2021-10-21 DIAGNOSIS — Z720 Tobacco use: Secondary | ICD-10-CM | POA: Diagnosis not present

## 2021-10-21 DIAGNOSIS — M5136 Other intervertebral disc degeneration, lumbar region: Secondary | ICD-10-CM | POA: Diagnosis not present

## 2021-10-21 DIAGNOSIS — G4733 Obstructive sleep apnea (adult) (pediatric): Secondary | ICD-10-CM | POA: Diagnosis not present

## 2021-10-21 DIAGNOSIS — I2510 Atherosclerotic heart disease of native coronary artery without angina pectoris: Secondary | ICD-10-CM | POA: Insufficient documentation

## 2021-10-21 DIAGNOSIS — R002 Palpitations: Secondary | ICD-10-CM | POA: Diagnosis not present

## 2021-10-21 DIAGNOSIS — Z86718 Personal history of other venous thrombosis and embolism: Secondary | ICD-10-CM | POA: Diagnosis not present

## 2021-10-21 DIAGNOSIS — I43 Cardiomyopathy in diseases classified elsewhere: Secondary | ICD-10-CM | POA: Insufficient documentation

## 2021-10-21 DIAGNOSIS — Z7901 Long term (current) use of anticoagulants: Secondary | ICD-10-CM | POA: Insufficient documentation

## 2021-10-21 DIAGNOSIS — I5032 Chronic diastolic (congestive) heart failure: Secondary | ICD-10-CM | POA: Diagnosis not present

## 2021-10-21 DIAGNOSIS — G629 Polyneuropathy, unspecified: Secondary | ICD-10-CM | POA: Insufficient documentation

## 2021-10-21 DIAGNOSIS — R000 Tachycardia, unspecified: Secondary | ICD-10-CM | POA: Diagnosis not present

## 2021-10-21 DIAGNOSIS — I739 Peripheral vascular disease, unspecified: Secondary | ICD-10-CM | POA: Diagnosis not present

## 2021-10-21 DIAGNOSIS — Z6841 Body Mass Index (BMI) 40.0 and over, adult: Secondary | ICD-10-CM | POA: Diagnosis not present

## 2021-10-21 DIAGNOSIS — I270 Primary pulmonary hypertension: Secondary | ICD-10-CM

## 2021-10-21 LAB — CBC
HCT: 40.6 % (ref 39.0–52.0)
Hemoglobin: 13.4 g/dL (ref 13.0–17.0)
MCH: 30.5 pg (ref 26.0–34.0)
MCHC: 33 g/dL (ref 30.0–36.0)
MCV: 92.3 fL (ref 80.0–100.0)
Platelets: 228 10*3/uL (ref 150–400)
RBC: 4.4 MIL/uL (ref 4.22–5.81)
RDW: 17.3 % — ABNORMAL HIGH (ref 11.5–15.5)
WBC: 8 10*3/uL (ref 4.0–10.5)
nRBC: 0 % (ref 0.0–0.2)

## 2021-10-21 LAB — BASIC METABOLIC PANEL
Anion gap: 10 (ref 5–15)
BUN: 18 mg/dL (ref 8–23)
CO2: 24 mmol/L (ref 22–32)
Calcium: 9.1 mg/dL (ref 8.9–10.3)
Chloride: 108 mmol/L (ref 98–111)
Creatinine, Ser: 1.24 mg/dL (ref 0.61–1.24)
GFR, Estimated: >60 mL/min (ref >60)
Glucose, Bld: 106 mg/dL — ABNORMAL HIGH (ref 70–99)
Potassium: 4 mmol/L (ref 3.5–5.1)
Sodium: 142 mmol/L (ref 135–145)

## 2021-10-21 LAB — BRAIN NATRIURETIC PEPTIDE: B Natriuretic Peptide: 30 pg/mL (ref 0.0–100.0)

## 2021-10-21 NOTE — Patient Instructions (Addendum)
Referral sent to [facility]. If you need to contact them, call [phone] Return in 4 months to see Dr. Haroldine Laws

## 2021-10-22 DIAGNOSIS — M5136 Other intervertebral disc degeneration, lumbar region: Secondary | ICD-10-CM | POA: Diagnosis not present

## 2021-10-23 ENCOUNTER — Other Ambulatory Visit (HOSPITAL_COMMUNITY): Payer: Self-pay

## 2021-10-23 DIAGNOSIS — M5136 Other intervertebral disc degeneration, lumbar region: Secondary | ICD-10-CM | POA: Diagnosis not present

## 2021-10-23 NOTE — Progress Notes (Signed)
DERAL, SCHELLENBERG ([id number]) Visit Report for 10/20/2021 Arrival Information Details Patient Name: Date of Service: Russell Collier 10/20/2021 3:15 PM Medical Record Number: [id number] Patient Account Number: [id number] Date of Birth/Sex: Treating RN: Sep 07, 1960 (61 y.o. [name] Primary Care Starlin Steib: Alen Bleacher Other Clinician: Referring Shaquel Josephson: Treating Fard Borunda/Extender: Jannet Askew in Treatment: 9 Visit Information History Since Last Visit Added or deleted any medications: No Patient Arrived: Kasandra Knudsen Any new allergies or adverse reactions: No Arrival Time: 14:50 Had a fall or experienced change in No Accompanied By: self activities of daily living that may affect Transfer Assistance: None risk of falls: Patient Identification Verified: Yes Signs or symptoms of abuse/neglect since last visito No Secondary Verification Process Completed: Yes Hospitalized since last visit: No Patient Requires Transmission-Based Precautions: No Implantable device outside of the clinic excluding No Patient Has Alerts: Yes cellular tissue based products placed in the center Patient Alerts: Patient on Blood Thinner since last visit: Has Dressing in Place as Prescribed: Yes Has Compression in Place as Prescribed: Yes Pain Present Now: Yes Electronic Signature(s) Signed: 10/20/2021 5:09:16 PM By: Deon Pilling RN, BSN Entered By: Deon Pilling on 10/20/2021 14:55:52 -------------------------------------------------------------------------------- Compression Therapy Details Patient Name: Date of Service: Russell Collier 10/20/2021 3:15 PM Medical Record Number: [id number] Patient Account Number: [id number] Date of Birth/Sex: Treating RN: March 02, 1960 (61 y.o. Erie Noe Primary Care Rosalita Carey: Alen Bleacher Other Clinician: Referring Jameelah Watts: Treating Amauria Younts/Extender: Jannet Askew in Treatment: 9 Compression Therapy Performed  for Wound Assessment: Wound #2 Left,Anterior Lower Leg Performed By: Clinician Rhae Hammock, RN Compression Type: Three Layer Post Procedure Diagnosis Same as Pre-procedure Electronic Signature(s) Signed: 10/23/2021 1:10:23 PM By: Rhae Hammock RN Entered By: Rhae Hammock on 10/20/2021 15:27:51 -------------------------------------------------------------------------------- Encounter Discharge Information Details Patient Name: Date of Service: Russell Collier 10/20/2021 3:15 PM Medical Record Number: [id number] Patient Account Number: [id number] Date of Birth/Sex: Treating RN: 25-Oct-1960 (61 y.o. Burnadette Collier, Russell Primary Care Tesslyn Baumert: Alen Bleacher Other Clinician: Referring Karlynn Furrow: Treating Karinda Cabriales/Extender: Jannet Askew in Treatment: 9 Encounter Discharge Information Items Post Procedure Vitals Discharge Condition: Stable Temperature (F): 98.7 Ambulatory Status: Ambulatory Pulse (bpm): 74 Discharge Destination: Home Respiratory Rate (breaths/min): 17 Transportation: Private Auto Blood Pressure (mmHg): 134/74 Accompanied By: self Schedule Follow-up Appointment: Yes Clinical Summary of Care: Patient Declined Electronic Signature(s) Signed: 10/23/2021 1:10:23 PM By: Rhae Hammock RN Entered By: Rhae Hammock on 10/20/2021 15:30:38 -------------------------------------------------------------------------------- Lower Extremity Assessment Details Patient Name: Date of Service: Collier, Russell 10/20/2021 3:15 PM Medical Record Number: [id number] Patient Account Number: [id number] Date of Birth/Sex: Treating RN: 03-30-1960 (61 y.o. [name] Primary Care Dvon Jiles: Alen Bleacher Other Clinician: Referring Symphanie Cederberg: Treating Heavenly Christine/Extender: Jannet Askew in Treatment: 9 Edema Assessment Assessed: [name]: Yes] [name]: No] Edema: [Left: N] [Right: o] Calf Left: Right: Point of Measurement:  37 cm From Medial Instep 38.5 cm Ankle Left: Right: Point of Measurement: 10 cm From Medial Instep 23 cm Vascular Assessment Pulses: Dorsalis Pedis Palpable: [Left:Yes] Electronic Signature(s) Signed: 10/20/2021 5:09:16 PM By: Deon Pilling RN, BSN Entered By: Deon Pilling on 10/20/2021 14:56:22 -------------------------------------------------------------------------------- Multi Wound Chart Details Patient Name: Date of Service: Russell Collier 10/20/2021 3:15 PM Medical Record Number: [id number] Patient Account Number: [id number] Date of Birth/Sex: Treating RN: [birth year] (61 y.o. Burnadette Collier, Russell Primary Care Nile Prisk: Alen Bleacher Other Clinician: Referring Anasia Agro: Treating Sigifredo Pignato/Extender: Jannet Askew in Treatment: 9 Vital Signs  Height(in): 70 Pulse(bpm): 73 Weight(lbs): 315 Blood Pressure(mmHg): 148/79 Body Mass Index(BMI): 45.2 Temperature(F): 98.5 Respiratory Rate(breaths/min): 20 Photos: [N/A:N/A] Left, Anterior Lower Leg N/A N/A Wound Location: Trauma N/A N/A Wounding Event: Arterial Insufficiency Ulcer N/A N/A Primary Etiology: Hemophilia, Asthma, Sleep Apnea, N/A N/A Comorbid History: Angina, Congestive Heart Failure, Coronary Artery Disease, Hypertension, Peripheral Arterial Disease, Type II Diabetes, Osteoarthritis, Neuropathy 08/03/2021 N/A N/A Date Acquired: 9 N/A N/A Weeks of Treatment: Open N/A N/A Wound Status: No N/A N/A Wound Recurrence: Yes N/A N/A Clustered Wound: 1 N/A N/A Clustered Quantity: 0.2x0.2x0.1 N/A N/A Measurements L x W x D (cm) 0.031 N/A N/A A (cm) : rea 0.003 N/A N/A Volume (cm) : 98.20% N/A N/A % Reduction in A rea: 99.20% N/A N/A % Reduction in Volume: Full Thickness Without Exposed N/A N/A Classification: Support Structures Large N/A N/A Exudate A mount: Serous N/A N/A Exudate Type: amber N/A N/A Exudate Color: Distinct, outline attached N/A N/A Wound  Margin: None Present (0%) N/A N/A Granulation A mount: Large (67-100%) N/A N/A Necrotic A mount: Fat Layer (Subcutaneous Tissue): Yes N/A N/A Exposed Structures: Fascia: No Tendon: No Muscle: No Joint: No Bone: No Large (67-100%) N/A N/A Epithelialization: Debridement - Excisional N/A N/A Debridement: Pre-procedure Verification/Time Out 15:10 N/A N/A Taken: Lidocaine N/A N/A Pain Control: Subcutaneous, Slough N/A N/A Tissue Debrided: Skin/Subcutaneous Tissue N/A N/A Level: 0.04 N/A N/A Debridement A (sq cm): rea Curette N/A N/A Instrument: Minimum N/A N/A Bleeding: Pressure N/A N/A Hemostasis A chieved: 0 N/A N/A Procedural Pain: 0 N/A N/A Post Procedural Pain: Procedure was tolerated well N/A N/A Debridement Treatment Response: 0.2x0.2x0.1 N/A N/A Post Debridement Measurements L x W x D (cm) 0.003 N/A N/A Post Debridement Volume: (cm) Compression Therapy N/A N/A Procedures Performed: Debridement Treatment Notes Wound #2 (Lower Leg) Wound Laterality: Left, Anterior Cleanser Soap and Water Discharge Instruction: May shower and wash wound with dial antibacterial soap and water prior to dressing change. Wound Cleanser Discharge Instruction: Cleanse the wound with wound cleanser prior to applying a clean dressing using gauze sponges, not tissue or cotton balls. Peri-Wound Care Triamcinolone 15 (g) Discharge Instruction: Use triamcinolone 15 (g) as directed Sween Lotion (Moisturizing lotion) Discharge Instruction: Apply moisturizing lotion as directed Topical Primary Dressing Endoform 2x2 in Discharge Instruction: Moisten with saline Secondary Dressing ABD Pad, 8x10 Discharge Instruction: Apply over primary dressing as directed. Woven Gauze Sponge, Non-Sterile 4x4 in Discharge Instruction: Apply over primary dressing as directed. Secured With Compression Wrap ThreePress (3 layer compression wrap) Discharge Instruction: Apply three layer compression  as directed. Compression Stockings Add-Ons Electronic Signature(s) Signed: 10/20/2021 3:46:11 PM By: Kalman Shan DO Signed: 10/23/2021 1:10:23 PM By: Rhae Hammock RN Entered By: Kalman Shan on 10/20/2021 15:42:56 -------------------------------------------------------------------------------- Multi-Disciplinary Care Plan Details Patient Name: Date of Service: Russell [place], [place] Collier 10/20/2021 3:15 PM Medical Record Number: [id number] Patient Account Number: [id number] Date of Birth/Sex: Treating RN: 05-Jul-1960 (61 y.o. [name] Primary Care Evanthia Maund: Alen Bleacher Other Clinician: Referring Karrie Fluellen: Treating Lateasha Breuer/Extender: Jannet Askew in Treatment: 9 Active Inactive Venous Leg Ulcer Nursing Diagnoses: Knowledge deficit related to disease process and management Goals: Patient will maintain optimal edema control Date Initiated: 10/20/2021 Target Resolution Date: 10/30/2021 Goal Status: Active Interventions: Provide education on venous insufficiency Treatment Activities: Therapeutic compression applied : 10/20/2021 Notes: Electronic Signature(s) Signed: 10/20/2021 5:09:16 PM By: Deon Pilling RN, BSN Entered By: Deon Pilling on 10/20/2021 15:02:07 -------------------------------------------------------------------------------- Pain Assessment Details Patient Name: Date of Service: Russell Collier 10/20/2021  3:15 PM Medical Record Number: [id number] Patient Account Number: [id number] Date of Birth/Sex: Treating RN: [birth year] (61 y.o. [name] Primary Care Christee Mervine: Alen Bleacher Other Clinician: Referring Rhonin Trott: Treating Brave Dack/Extender: Jannet Askew in Treatment: 9 Active Problems Location of Pain Severity and Description of Pain Patient Has Paino Yes Site Locations Pain Location: Pain in Ulcers Rate the pain. Current Pain Level: 4 Pain Management and Medication Current Pain  Management: Medication: No Cold Application: No Rest: No Massage: No Activity: No T.E.N.S.: No Heat Application: No Leg drop or elevation: No Is the Current Pain Management Adequate: Adequate How does your wound impact your activities of daily livingo Sleep: No Bathing: No Appetite: No Relationship With Others: No Bladder Continence: No Emotions: No Bowel Continence: No Work: No Toileting: No Drive: No Dressing: No Hobbies: No Engineer, maintenance) Signed: 10/20/2021 5:09:16 PM By: Deon Pilling RN, BSN Entered By: Deon Pilling on 10/20/2021 14:56:14 -------------------------------------------------------------------------------- Patient/Caregiver Education Details Patient Name: Date of Service: Russell Collier 8/29/2023andnbsp3:15 PM Medical Record Number: [id number] Patient Account Number: [id number] Date of Birth/Gender: Treating RN: Jan 02, 1961 (61 y.o. [name] Primary Care Physician: Alen Bleacher Other Clinician: Referring Physician: Treating Physician/Extender: Jannet Askew in Treatment: 9 Education Assessment Education Provided To: Patient Education Topics Provided Venous: Handouts: Controlling Swelling with Compression Stockings Methods: Explain/Verbal Responses: Reinforcements needed Electronic Signature(s) Signed: 10/20/2021 5:09:16 PM By: Deon Pilling RN, BSN Entered By: Deon Pilling on 10/20/2021 15:02:21 -------------------------------------------------------------------------------- Wound Assessment Details Patient Name: Date of Service: [place], [place] Collier 10/20/2021 3:15 PM Medical Record Number: [id number] Patient Account Number: [id number] Date of Birth/Sex: Treating RN: 02-Mar-1960 (61 y.o. Lorette Ang, Meta.Reding Primary Care Aarika Moon: Alen Bleacher Other Clinician: Referring Tamara Kenyon: Treating Shiryl Ruddy/Extender: Jannet Askew in Treatment: 9 Wound Status Wound Number: 2 Primary  Arterial Insufficiency Ulcer Etiology: Wound Location: Left, Anterior Lower Leg Wound Open Wounding Event: Trauma Status: Date Acquired: 08/03/2021 Comorbid Hemophilia, Asthma, Sleep Apnea, Angina, Congestive Heart Weeks Of Treatment: 9 History: Failure, Coronary Artery Disease, Hypertension, Peripheral Arterial Clustered Wound: Yes Disease, Type II Diabetes, Osteoarthritis, Neuropathy Photos Wound Measurements Length: (cm) 0.2 Width: (cm) 0.2 Depth: (cm) 0.1 Clustered Quantity: 1 Area: (cm) 0.031 Volume: (cm) 0.003 % Reduction in Area: 98.2% % Reduction in Volume: 99.2% Epithelialization: Large (67-100%) Tunneling: No Undermining: No Wound Description Classification: Full Thickness Without Exposed Support Structures Wound Margin: Distinct, outline attached Exudate Amount: Large Exudate Type: Serous Exudate Color: amber Foul Odor After Cleansing: No Slough/Fibrino Yes Wound Bed Granulation Amount: None Present (0%) Exposed Structure Necrotic Amount: Large (67-100%) Fascia Exposed: No Necrotic Quality: Adherent Slough Fat Layer (Subcutaneous Tissue) Exposed: Yes Tendon Exposed: No Muscle Exposed: No Joint Exposed: No Bone Exposed: No Treatment Notes Wound #2 (Lower Leg) Wound Laterality: Left, Anterior Cleanser Soap and Water Discharge Instruction: May shower and wash wound with dial antibacterial soap and water prior to dressing change. Wound Cleanser Discharge Instruction: Cleanse the wound with wound cleanser prior to applying a clean dressing using gauze sponges, not tissue or cotton balls. Peri-Wound Care Triamcinolone 15 (g) Discharge Instruction: Use triamcinolone 15 (g) as directed Sween Lotion (Moisturizing lotion) Discharge Instruction: Apply moisturizing lotion as directed Topical Primary Dressing Endoform 2x2 in Discharge Instruction: Moisten with saline Secondary Dressing ABD Pad, 8x10 Discharge Instruction: Apply over primary dressing as  directed. Woven Gauze Sponge, Non-Sterile 4x4 in Discharge Instruction: Apply over primary dressing as directed. Secured With Compression Wrap ThreePress (3 layer compression wrap) Discharge Instruction: Apply three  layer compression as directed. Compression Stockings Add-Ons Electronic Signature(s) Signed: 10/20/2021 5:09:16 PM By: Deon Pilling RN, BSN Entered By: Deon Pilling on 10/20/2021 14:59:26 -------------------------------------------------------------------------------- Vitals Details Patient Name: Date of Service: [place], RO Collier 10/20/2021 3:15 PM Medical Record Number: [id number] Patient Account Number: [id number] Date of Birth/Sex: Treating RN: 02-10-1961 (61 y.o. [name] Primary Care Dandra Velardi: Alen Bleacher Other Clinician: Referring Isabeau Mccalla: Treating Lourdez Mcgahan/Extender: Jannet Askew in Treatment: 9 Vital Signs Time Taken: 14:50 Temperature (F): 98.5 Height (in): 70 Pulse (bpm): 73 Weight (lbs): 315 Respiratory Rate (breaths/min): 20 Body Mass Index (BMI): 45.2 Blood Pressure (mmHg): 148/79 Reference Range: 80 - 120 mg / dl Electronic Signature(s) Signed: 10/20/2021 5:09:16 PM By: Deon Pilling RN, BSN Entered By: Deon Pilling on 10/20/2021 14:56:03

## 2021-10-23 NOTE — Progress Notes (Signed)
Russell Collier ([id number]) Visit Report for 10/20/2021 Chief Complaint Document Details Patient Name: Date of Service: Russell Collier 10/20/2021 3:15 PM Medical Record Number: [id number] Patient Account Number: [id number] Date of Birth/Sex: Treating RN: 14-Feb-1961 (61 y.o. Russell Collier, Russell Collier Primary Care Provider: Alen Bleacher Other Clinician: Referring Provider: Treating Provider/Extender: Russell Collier in Treatment: 9 Information Obtained from: Patient Chief Complaint Left anterior LE Ulcer Electronic Signature(s) Signed: 10/20/2021 3:46:11 PM By: Russell Shan DO Entered By: Russell Collier on 10/20/2021 15:43:06 -------------------------------------------------------------------------------- Debridement Details Patient Name: Date of Service: Russell Collier 10/20/2021 3:15 PM Medical Record Number: [id number] Patient Account Number: [id number] Date of Birth/Sex: Treating RN: [birth year] (61 y.o. Russell Collier, Russell Collier Primary Care Provider: Alen Bleacher Other Clinician: Referring Provider: Treating Provider/Extender: Russell Collier in Treatment: 9 Debridement Performed for Assessment: Wound #2 Left,Anterior Lower Leg Performed By: Physician Russell Shan, DO Debridement Type: Debridement Severity of Tissue Pre Debridement: Fat layer exposed Level of Consciousness (Pre-procedure): Awake and Alert Pre-procedure Verification/Time Out Yes - 15:10 Taken: Start Time: 15:10 Pain Control: Lidocaine T Area Debrided (L x W): otal 0.2 (cm) x 0.2 (cm) = 0.04 (cm) Tissue and other material debrided: Viable, Non-Viable, Slough, Subcutaneous, Skin: Dermis , Slough Level: Skin/Subcutaneous Tissue Debridement Description: Excisional Instrument: Curette Bleeding: Minimum Hemostasis Achieved: Pressure End Time: 15:10 Procedural Pain: 0 Post Procedural Pain: 0 Response to Treatment: Procedure was tolerated well Level of  Consciousness (Post- Awake and Alert procedure): Post Debridement Measurements of Total Wound Length: (cm) 0.2 Width: (cm) 0.2 Depth: (cm) 0.1 Volume: (cm) 0.003 Character of Wound/Ulcer Post Debridement: Improved Severity of Tissue Post Debridement: Fat layer exposed Post Procedure Diagnosis Same as Pre-procedure Electronic Signature(s) Signed: 10/20/2021 3:46:11 PM By: Russell Shan DO Signed: 10/23/2021 1:10:23 PM By: Russell Hammock RN Entered By: Russell Collier on 10/20/2021 15:14:29 -------------------------------------------------------------------------------- HPI Details Patient Name: Date of Service: Russell Collier 10/20/2021 3:15 PM Medical Record Number: [id number] Patient Account Number: [id number] Date of Birth/Sex: Treating RN: [birth year] (61 y.o. Russell Collier, Russell Collier Primary Care Provider: Alen Bleacher Other Clinician: Referring Provider: Treating Provider/Extender: Russell Collier in Treatment: 9 History of Present Illness HPI Description: ADMISSION 09/14/2018 This is a 61 year old man who traumatized his left anterior tibia area on the bathtub. He describes this initially as a bruise that subsequently reopened. He has been seen in the ER several times and has had at least 3 courses of antibiotics including doxy and Keflex twice. He has been using Neosporin and peroxide on this. Past medical history includes obstructive sleep apnea, cellulitis, hypertension, coronary artery disease, peripheral neuropathy [nondiabetic], currently on a heart murmur for atypical chest pain and bradycardia. 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 ARTERIAL STUDIES done in March 2020 showed an ABI on the left of 0.93, TBI on the left slightly reduced at 0.42. On the right his ABI was 0.95 with a TBI of 0.51 he has monophasic waveforms in the lower extremities bilaterally. Interpreted on the left this showing mild left lower extremity arterial disease as well  as on the right. 09/25/18-Patient returns at 1 week to the clinic, noted that arterial studies done in March 2020. We have been using Iodoflex to the wound on the left leg, patient has significant degree of pain and declines to have any debridement 8/21; the patient has not been here in 3 weeks. Since he was last here he was admitted to hospital on 8/4. He was felt to have stable angina.  He has since been to his cardiologist. I note that he has an EF of about 48%. He has a venous wound on the left anterior mid tibia area probably with lymphedema. We have been using Iodoflex. He does not tolerate debridement well. He tells me that his cardiologist has been adjusting his Lasix and then the wraps fall down he tries to pull them up. He is describing pain making it difficult for him to function. 9/3; patient is was hospitalized since he was last here with COPD exacerbation. Came in with Aquacel Ag on the wound. The wound actually looks improved. 9/17; patient was apparently in the ER again with breathing issues and blood pressure issues. He was not admitted. We are using Sorbact of the wound with nice improvement dimensions are better 10/1; the patient's wound is smaller and appears to have a reasonable healthy surface. We are using Sorbact 10/8; the wound is smaller although there is still an area with depth and necrotic debris at the base. We are using Sorbact 10/15; not much change in the wound area. We have been using Sorbact. 10/22; small wound area using endoform since last week 11/5; Mr. Solis arrived today in a healed state. Left leg and wrapped. He still does not have compression stockings Readmission: 08-12-2021 upon evaluation today patient presents for evaluation here in the clinic he has been seen last in 2020 at that point Dr. Dellia Nims was the treating physician. Subsequently the patient tells me that he does have a history of peripheral vascular disease, chronic venous insufficiency,  diabetes mellitus type 2, and coronary artery disease. Unfortunately he has had some issues with his right leg he recently has been seen [facility] and they did perform an arteriogram on the right leg I believe even putting a stent in. With that being said that leg seems to be doing well he mainly just needs a compression sock. In regard to the left leg which is the one in question he tells me the same day he had a surgery he hit the left leg on a piece of furniture on the anterior shin. This caused a area that seems to be a hematoma present at this point. With that being said the patient is having significant discomfort. He tells me. He has been taking ibuprofen which he should not be doing I discussed that with him today. Tylenol is really only should be taken being on the Eliquis. He also tells me that he has not really been put anything on other than a Band-Aid I really think he may benefit from some light compression I do not want to do anything too tight until we get a good review of his arterial flow as his ABIs are not good we got here in the office today was 0.75 on the left 0.65 on the right and to be honest he did not have palpable pulses that I could find. He does not appear to have had an arterial study since 2020 as noted above by Dr. Dellia Nims. 08-19-2021 upon evaluation today patient appears to be doing okay in regard to his wound although he does appear to have some fluid buildup underneath this area. Again I am a little reluctant to try to open this up right now due to the fact that the patient does not appear to have good blood flow based on our initial inspection during the clinic evaluation today. Again he does see his vascular surgeon today. We did not have  any palpable pulses that we could find last time I saw him and overall I think that he probably just needs to be seen in order to evaluate and see where things stand. 08-26-2021 upon evaluation today patient  appears to be doing well currently in regard to his wound. Has been tolerating the dressing changes without complication. Fortunately we do not have a big open area but he does have some fluid underneath in the center this looks like it might start draining on its own soon which would be ideal. With that being said right now he has been using the Curlex and Coban wrap which she does tolerate okay. He is going for an arteriogram on 14 July. After that if we need to open this we should be able to presuming they get his blood flow going appropriately. 09-02-2021 upon evaluation today patient appears to be doing about the same in regard to his wound this is starting to drain a lot more so it is definitely opening into something a little bit deeper. With that being said I do see signs of improvement to some degree and that there is not as much fluid buildup down in the base of the wound. I am very pleased with that. He is supposed to be having his vascular intervention on Monday after that hopefully will have better blood flow we will try to get this open to be getting draining completely and hopefully get this to resolve. He is not really wearing the compression right now because of discomfort that may again also be partially related to the fact that leg does not have sufficient blood flow to be able and heal this appropriately. Again once we get good blood flow hopefully the pain will not be as much we will get better compression on him as well. 09-09-2021 patient underwent the balloon angioplasty of the left peroneal artery on 09-07-2021. It sounds like that the area of stenosis was resolved based on what I am seeing at this point in time. It looks like the Dr. Gwenlyn Saran was satisfied with the blood flow postprocedure which was awesome. The patient had no issues with complication during or following the procedure. He is actually present for evaluation here in the clinic today and the good news is the wound is  already looking much better and has only been a couple of days. Fortunately he is not having a terrible amount of pain though is still having some discomfort at this time. I definitely think this is something that we can manage quite nicely. 7/26; the patient underwent a balloon angioplasty of the left peroneal artery on 09/07/2021 he tolerated this well he has a small open area remaining. Using silver alginate and 3 layer compression 8/3; patient presents for follow-up. We have been using collagen under 3 layer compression. He has no issues or complaints today. 8/11; patient presents for follow-up. We have been using collagen under 3 layer compression again he has no issues or complaints today. He reports improvement in wound healing. 8/15; patient presents for follow-up. We have been using collagen under 3 layer compression. He has no issues or complaints today. He has tolerated the wrap well. 8/22; patient presents for follow-up. We have been using collagen under the 3 layer compression. He has tolerated this well. He has no issues or complaints today. 8/29; patient presents for follow-up. We have been using endoform under 3 layer compression. He has no issues or complaints today. Electronic Signature(s) Signed: 10/20/2021 3:46:11 PM By: Heber [place],  Janett Billow DO Entered By: Russell Collier on 10/20/2021 15:43:37 -------------------------------------------------------------------------------- Physical Exam Details Patient Name: Date of Service: Russell Collier 10/20/2021 3:15 PM Medical Record Number: [id number] Patient Account Number: [id number] Date of Birth/Sex: Treating RN: 1960/04/03 (61 y.o. Erie Noe Primary Care Provider: Alen Bleacher Other Clinician: Referring Provider: Treating Provider/Extender: Russell Collier in Treatment: 9 Constitutional respirations regular, non-labored and within target range for patient.. Cardiovascular 2+ dorsalis  pedis/posterior tibialis pulses. Psychiatric pleasant and cooperative. Notes Left leg: T the anterior aspect there is an open wound with granulation tissue and nonviable tissue. Postdebridement there is granulation tissue and o epithelization occurring in the center. Good edema control. No signs of surrounding infection. Electronic Signature(s) Signed: 10/20/2021 3:46:11 PM By: Russell Shan DO Entered By: Russell Collier on 10/20/2021 15:44:23 -------------------------------------------------------------------------------- Physician Orders Details Patient Name: Date of Service: [place], RO Collier 10/20/2021 3:15 PM Medical Record Number: [id number] Patient Account Number: [id number] Date of Birth/Sex: Treating RN: 02/08/1961 (61 y.o. Russell Collier, Russell Collier Primary Care Provider: Alen Bleacher Other Clinician: Referring Provider: Treating Provider/Extender: Russell Collier in Treatment: 9 Verbal / Phone Orders: No Diagnosis Coding ICD-10 Coding Code Description [phone] Non-pressure chronic ulcer of other part of left lower leg with fat layer exposed I87.312 Chronic venous hypertension (idiopathic) with ulcer of left lower extremity I73.89 Other specified peripheral vascular diseases E11.622 Type 2 diabetes mellitus with other skin ulcer I25.10 Atherosclerotic heart disease of native coronary artery without angina pectoris Follow-up Appointments ppointment in 1 week. - Tuesday 10/27/21 @ 3:15 w/ Dr. Heber Makakilo and Allayne Butcher Rm # 6 ***OVERFLOW**** Return A Other: - -Do not use Ibuprofen while on Eliquis Bathing/ Shower/ Hygiene May shower and wash wound with soap and water. - Use antibacterial soap, do not use peroxide or alcohol Edema Control - Lymphedema / SCD / Other Elevate legs to the level of the heart or above for 30 minutes daily and/or when sitting, a frequency of: - throughout the day Avoid standing for long periods of time. Patient to wear own  compression stockings every day. - Use 15-62mHg Compression stocking for right leg. Additional Orders / Instructions Stop/Decrease Smoking Follow Nutritious Diet Wound Treatment Wound #2 - Lower Leg Wound Laterality: Left, Anterior Cleanser: Soap and Water 1 x Per Week/30 Days Discharge Instructions: May shower and wash wound with dial antibacterial soap and water prior to dressing change. Cleanser: Wound Cleanser 1 x Per Week/30 Days Discharge Instructions: Cleanse the wound with wound cleanser prior to applying a clean dressing using gauze sponges, not tissue or cotton balls. Peri-Wound Care: Triamcinolone 15 (g) 1 x Per Week/30 Days Discharge Instructions: Use triamcinolone 15 (g) as directed Peri-Wound Care: Sween Lotion (Moisturizing lotion) 1 x Per Week/30 Days Discharge Instructions: Apply moisturizing lotion as directed Prim Dressing: Endoform 2x2 in 1 x Per Week/30 Days ary Discharge Instructions: Moisten with saline Secondary Dressing: ABD Pad, 8x10 (Generic) 1 x Per Week/30 Days Discharge Instructions: Apply over primary dressing as directed. Secondary Dressing: Woven Gauze Sponge, Non-Sterile 4x4 in 1 x Per Week/30 Days Discharge Instructions: Apply over primary dressing as directed. Compression Wrap: ThreePress (3 layer compression wrap) 1 x Per Week/30 Days Discharge Instructions: Apply three layer compression as directed. Electronic Signature(s) Signed: 10/20/2021 3:46:11 PM By: HKalman ShanDO Entered By: HKalman Shanon 10/20/2021 15:44:36 -------------------------------------------------------------------------------- Problem List Details Patient Name: Date of Service: [place] Collier 10/20/2021 3:15 PM Medical Record Number: [id number] Account Number: [id number] of Birth/Sex:  Treating RN: April 03, 1960 (61 y.o. [name] Primary Care Provider: Alen Bleacher Other Clinician: Referring Provider: Treating Provider/Extender: Russell Collier in Treatment: 9 Active Problems ICD-10 Encounter Code Description Active Date MDM Diagnosis [phone] Non-pressure chronic ulcer of other part of left lower leg with fat layer exposed6/21/2023 No Yes I87.312 Chronic venous hypertension (idiopathic) with ulcer of left lower extremity 10/13/2021 No Yes I73.89 Other specified peripheral vascular diseases 08/12/2021 No Yes E11.622 Type 2 diabetes mellitus with other skin ulcer 08/12/2021 No Yes I25.10 Atherosclerotic heart disease of native coronary artery without angina pectoris 08/12/2021 No Yes Inactive Problems Resolved Problems Electronic Signature(s) Signed: 10/20/2021 3:46:11 PM By: Russell Shan DO Entered By: Russell Collier on 10/20/2021 15:42:49 -------------------------------------------------------------------------------- Progress Note Details Patient Name: Date of Service: [place], RO Collier 10/20/2021 3:15 PM Medical Record Number: [id number] Patient Account Number: [id number] Date of Birth/Sex: Treating RN: Oct 02, 1960 (61 y.o. Russell Collier, Russell Collier Primary Care Provider: Alen Bleacher Other Clinician: Referring Provider: Treating Provider/Extender: Russell Collier in Treatment: 9 Subjective Chief Complaint Information obtained from Patient Left anterior LE Ulcer History of Present Illness (HPI) ADMISSION 09/14/2018 This is a 60 year old man who traumatized his left anterior tibia area on the bathtub. He describes this initially as a bruise that subsequently reopened. He has been seen in the ER several times and has had at least 3 courses of antibiotics including doxy and Keflex twice. He has been using Neosporin and peroxide on this. Past medical history includes obstructive sleep apnea, cellulitis, hypertension, coronary artery disease, peripheral neuropathy [nondiabetic], currently on a heart murmur for atypical chest pain and bradycardia. He apparently has borderline  hemophilia. ARTERIAL STUDIES done in March 2020 showed an ABI on the left of 0.93, TBI on the left slightly reduced at 0.42. On the right his ABI was 0.95 with a TBI of 0.51 he has monophasic waveforms in the lower extremities bilaterally. Interpreted on the left this showing mild left lower extremity arterial disease as well as on the right. 09/25/18-Patient returns at 1 week to the clinic, noted that arterial studies done in March 2020. We have been using Iodoflex to the wound on the left leg, patient has significant degree of pain and declines to have any debridement 8/21; the patient has not been here in 3 weeks. Since he was last here he was admitted to hospital on 8/4. He was felt to have stable angina. He has since been to his cardiologist. I note that he has an EF of about 48%. He has a venous wound on the left anterior mid tibia area probably with lymphedema. We have been using Iodoflex. He does not tolerate debridement well. He tells me that his cardiologist has been adjusting his Lasix and then the wraps fall down he tries to pull them up. He is describing pain making it difficult for him to function. 9/3; patient is was hospitalized since he was last here with COPD exacerbation. Came in with Aquacel Ag on the wound. The wound actually looks improved. 9/17; patient was apparently in the ER again with breathing issues and blood pressure issues. He was not admitted. We are using Sorbact of the wound with nice improvement dimensions are better 10/1; the patient's wound is smaller and appears to have a reasonable healthy surface. We are using Sorbact 10/8; the wound is smaller although there is still an area with depth and necrotic debris at the base. We are using Sorbact 10/15; not much change in the wound  area. We have been using Sorbact. 10/22; small wound area using endoform since last week 11/5; Mr. Birchard arrived today in a healed state. Left leg and wrapped. He still does not have  compression stockings Readmission: 08-12-2021 upon evaluation today patient presents for evaluation here in the clinic he has been seen last in 2020 at that point Dr. Dellia Nims was the treating physician. Subsequently the patient tells me that he does have a history of peripheral vascular disease, chronic venous insufficiency, diabetes mellitus type 2, and coronary artery disease. Unfortunately he has had some issues with his right leg he recently has been seen by [facility] and they did perform an arteriogram on the right leg I believe even putting a stent in. With that being said that leg seems to be doing well he mainly just needs a compression sock. In regard to the left leg which is the one in question he tells me the same day he had a surgery he hit the left leg on a piece of furniture on the anterior shin. This caused a area that seems to be a hematoma present at this point. With that being said the patient is having significant discomfort. He tells me. He has been taking ibuprofen which he should not be doing I discussed that with him today. Tylenol is really only should be taken being on the Eliquis. He also tells me that he has not really been put anything on other than a Band-Aid I really think he may benefit from some light compression I do not want to do anything too tight until we get a good review of his arterial flow as his ABIs are not good we got here in the office today was 0.75 on the left 0.65 on the right and to be honest he did not have palpable pulses that I could find. He does not appear to have had an arterial study since 2020 as noted above by Dr. Dellia Nims. 08-19-2021 upon evaluation today patient appears to be doing okay in regard to his wound although he does appear to have some fluid buildup underneath this area. Again I am a little reluctant to try to open this up right now due to the fact that the patient does not appear to have good blood flow based on our  initial inspection during the clinic evaluation today. Again he does see his vascular surgeon today. We did not have any palpable pulses that we could find last time I saw him and overall I think that he probably just needs to be seen in order to evaluate and see where things stand. 08-26-2021 upon evaluation today patient appears to be doing well currently in regard to his wound. Has been tolerating the dressing changes without complication. Fortunately we do not have a big open area but he does have some fluid underneath in the center this looks like it might start draining on its own soon which would be ideal. With that being said right now he has been using the Curlex and Coban wrap which she does tolerate okay. He is going for an arteriogram on 14 July. After that if we need to open this we should be able to presuming they get his blood flow going appropriately. 09-02-2021 upon evaluation today patient appears to be doing about the same in regard to his wound this is starting to drain a lot more so it is definitely opening into something a little bit deeper. With that being said I  do see signs of improvement to some degree and that there is not as much fluid buildup down in the base of the wound. I am very pleased with that. He is supposed to be having his vascular intervention on Monday after that hopefully will have better blood flow we will try to get this open to be getting draining completely and hopefully get this to resolve. He is not really wearing the compression right now because of discomfort that may again also be partially related to the fact that leg does not have sufficient blood flow to be able and heal this appropriately. Again once we get good blood flow hopefully the pain will not be as much we will get better compression on him as well. 09-09-2021 patient underwent the balloon angioplasty of the left peroneal artery on 09-07-2021. It sounds like that the area of stenosis was resolved  based on what I am seeing at this point in time. It looks like the Dr. Gwenlyn Saran was satisfied with the blood flow postprocedure which was awesome. The patient had no issues with complication during or following the procedure. He is actually present for evaluation here in the clinic today and the good news is the wound is already looking much better and has only been a couple of days. Fortunately he is not having a terrible amount of pain though is still having some discomfort at this time. I definitely think this is something that we can manage quite nicely. 7/26; the patient underwent a balloon angioplasty of the left peroneal artery on 09/07/2021 he tolerated this well he has a small open area remaining. Using silver alginate and 3 layer compression 8/3; patient presents for follow-up. We have been using collagen under 3 layer compression. He has no issues or complaints today. 8/11; patient presents for follow-up. We have been using collagen under 3 layer compression again he has no issues or complaints today. He reports improvement in wound healing. 8/15; patient presents for follow-up. We have been using collagen under 3 layer compression. He has no issues or complaints today. He has tolerated the wrap well. 8/22; patient presents for follow-up. We have been using collagen under the 3 layer compression. He has tolerated this well. He has no issues or complaints today. 8/29; patient presents for follow-up. We have been using endoform under 3 layer compression. He has no issues or complaints today. Patient History Information obtained from Patient. Family History Cancer - Father, Diabetes - Father,Paternal Grandparents, Heart Disease - Mother, Hypertension - Mother, Kidney Disease - Maternal Grandparents, Lung Disease - Father, Stroke - Father, No family history of Hereditary Spherocytosis, Seizures, Thyroid Problems, Tuberculosis. 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 Medical History Eyes Denies history of Cataracts, Glaucoma, Optic Neuritis Hematologic/Lymphatic Patient has history of Hemophilia - Borderline Denies history of Anemia, Human Immunodeficiency Virus, Lymphedema, Sickle Cell Disease Respiratory Patient has history of Asthma, Sleep Apnea Denies history of Aspiration, Chronic Obstructive Pulmonary Disease (COPD), Pneumothorax, Tuberculosis Cardiovascular Patient has history of Angina - Atypical, Congestive Heart Failure, Coronary Artery Disease, Hypertension, Peripheral Arterial Disease Denies history of Arrhythmia, Deep Vein Thrombosis, Hypotension, Myocardial Infarction, Peripheral Venous Disease, Phlebitis, Vasculitis Gastrointestinal Denies history of Cirrhosis , Colitis, Crohnoos, Hepatitis A, Hepatitis B, Hepatitis C Endocrine Patient has history of Type II Diabetes Denies history of Type I Diabetes Genitourinary Denies history of End Stage Renal Disease  Immunological Denies history of Lupus Erythematosus, Raynaudoos, Scleroderma Integumentary (Skin) Denies history of History of Burn Musculoskeletal Patient has history of Osteoarthritis Denies history of Gout, Rheumatoid Arthritis, Osteomyelitis Neurologic Patient has history of Neuropathy Denies history of Dementia, Quadriplegia, Paraplegia, Seizure Disorder Oncologic Denies history of Received Chemotherapy, Received Radiation Psychiatric Denies history of Anorexia/bulimia, Confinement Anxiety Hospitalization/Surgery History [facility] - Acute Resp Failure. - cardiac cath with stent 12/19/18. 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 Vitals Time Taken: 2:50 PM, Height:  70 in, Weight: 315 lbs, BMI: 45.2, Temperature: 98.5 F, Pulse: 73 bpm, Respiratory Rate: 20 breaths/min, Blood Pressure: 148/79 mmHg. Cardiovascular 2+ dorsalis pedis/posterior tibialis pulses. Psychiatric pleasant and cooperative. General Notes: Left leg: T the anterior aspect there is an open wound with granulation tissue and nonviable tissue. Postdebridement there is granulation tissue o and epithelization occurring in the center. Good edema control. No signs of surrounding infection. Integumentary (Hair, Skin) Wound #2 status is Open. Original cause of wound was Trauma. The date acquired was: 08/03/2021. The wound has been in treatment 9 weeks. The wound is located on the Left,Anterior Lower Leg. The wound measures 0.2cm length x 0.2cm width x 0.1cm depth; 0.031cm^2 area and 0.003cm^3 volume. There is Fat Layer (Subcutaneous Tissue) exposed. There is no tunneling or undermining noted. There is a large amount of serous drainage noted. The wound margin is distinct with the outline attached to the wound base. There is no granulation within the wound bed. There is a large (67-100%) amount of necrotic tissue within the wound bed including Adherent Slough. Assessment Active Problems ICD-10 Non-pressure chronic ulcer of other part of left lower leg with fat layer exposed Chronic venous hypertension (idiopathic) with ulcer of left lower extremity Other specified peripheral vascular diseases Type 2 diabetes mellitus with other skin ulcer Atherosclerotic heart disease of native coronary artery without angina pectoris Patient's wound has shown improvement in size and appearance since last clinic visit. I debrided nonviable tissue. I recommended continuing endoform under 3 layer compression. Follow-up in 1 week. Procedures Wound #2 Pre-procedure diagnosis of Wound #2 is an Arterial Insufficiency Ulcer located on the Left,Anterior Lower Leg .Severity of Tissue Pre Debridement is: Fat layer  exposed. There was a Excisional Skin/Subcutaneous Tissue Debridement with a total area of 0.04 sq cm performed by Russell Shan, DO. With the following instrument(s): Curette to remove Viable and Non-Viable tissue/material. Material removed includes Subcutaneous Tissue, Slough, and Skin: Dermis after achieving pain control using Lidocaine. No specimens were taken. A time out was conducted at 15:10, prior to the start of the procedure. A Minimum amount of bleeding was controlled with Pressure. The procedure was tolerated well with a pain level of 0 throughout and a pain level of 0 following the procedure. Post Debridement Measurements: 0.2cm length x 0.2cm width x 0.1cm depth; 0.003cm^3 volume. Character of Wound/Ulcer Post Debridement is improved. Severity of Tissue Post Debridement is: Fat layer exposed. Post procedure Diagnosis Wound #2: Same as Pre-Procedure Pre-procedure diagnosis of Wound #2 is an Arterial Insufficiency Ulcer located on the Left,Anterior Lower Leg . There was a Three Layer Compression Therapy Procedure by Russell Hammock, RN. Post procedure Diagnosis Wound #2: Same as Pre-Procedure Plan Follow-up Appointments: Return Appointment in 1 week. - Tuesday 10/27/21 @ 3:15 w/ Dr. Heber Yerington and Allayne Butcher Rm # 6 ***OVERFLOW**** Other: - -Do not use Ibuprofen while on Eliquis Bathing/ Shower/  Hygiene: May shower and wash wound with soap and water. - Use antibacterial soap, do not use peroxide or alcohol Edema Control - Lymphedema / SCD / Other: Elevate legs to the level of the heart or above for 30 minutes daily and/or when sitting, a frequency of: - throughout the day Avoid standing for long periods of time. Patient to wear own compression stockings every day. - Use 15-58mHg Compression stocking for right leg. Additional Orders / Instructions: Stop/Decrease Smoking Follow Nutritious Diet WOUND #2: - Lower Leg Wound Laterality: Left, Anterior Cleanser: Soap and Water 1 x Per  Week/30 Days Discharge Instructions: May shower and wash wound with dial antibacterial soap and water prior to dressing change. Cleanser: Wound Cleanser 1 x Per Week/30 Days Discharge Instructions: Cleanse the wound with wound cleanser prior to applying a clean dressing using gauze sponges, not tissue or cotton balls. Peri-Wound Care: Triamcinolone 15 (g) 1 x Per Week/30 Days Discharge Instructions: Use triamcinolone 15 (g) as directed Peri-Wound Care: Sween Lotion (Moisturizing lotion) 1 x Per Week/30 Days Discharge Instructions: Apply moisturizing lotion as directed Prim Dressing: Endoform 2x2 in 1 x Per Week/30 Days ary Discharge Instructions: Moisten with saline Secondary Dressing: ABD Pad, 8x10 (Generic) 1 x Per Week/30 Days Discharge Instructions: Apply over primary dressing as directed. Secondary Dressing: Woven Gauze Sponge, Non-Sterile 4x4 in 1 x Per Week/30 Days Discharge Instructions: Apply over primary dressing as directed. Com pression Wrap: ThreePress (3 layer compression wrap) 1 x Per Week/30 Days Discharge Instructions: Apply three layer compression as directed. 1. In office sharp debridement 2. Endoform 3. 3 layer compression Electronic Signature(s) Signed: 10/20/2021 3:46:11 PM By: HKalman ShanDO Entered By: HKalman Shanon 10/20/2021 15:45:31 -------------------------------------------------------------------------------- HxROS Details Patient Name: Date of Service: [place] Collier 10/20/2021 3:15 PM Medical Record Number: [id number] Account Number: [id number] of Birth/Sex: Treating RN: [birth year](61y.o. MBurnadette Collier Russell Collier Primary Care Provider: NAlen BleacherOther Clinician: Referring Provider: Treating Provider/Extender: HJannet Askewin Treatment: 9 Information Obtained From Patient Eyes Medical History: Negative for: Cataracts; Glaucoma; Optic Neuritis Hematologic/Lymphatic Medical History: Positive  for: Hemophilia - Borderline Negative for: Anemia; Human Immunodeficiency Virus; Lymphedema; Sickle Cell Disease Respiratory Medical History: Positive for: Asthma; Sleep Apnea Negative for: Aspiration; Chronic Obstructive Pulmonary Disease (COPD); Pneumothorax; Tuberculosis Past Medical History Notes: Pulmonary Embolism Cardiovascular Medical History: Positive for: Angina - Atypical; Congestive Heart Failure; Coronary Artery Disease; Hypertension; Peripheral Arterial Disease Negative for: Arrhythmia; Deep Vein Thrombosis; Hypotension; Myocardial Infarction; Peripheral Venous Disease; Phlebitis; Vasculitis Past Medical History Notes: Hyperlipidemia, Bradycardia, 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 Gastrointestinal Medical History: Negative for: Cirrhosis ; Colitis; Crohns; Hepatitis A; Hepatitis B; Hepatitis C Endocrine Medical History: Positive for: Type II Diabetes Negative for: Type I Diabetes Treated with: Oral agents Blood sugar tested every day: No Genitourinary Medical History: Negative for: End Stage Renal Disease Immunological Medical History: Negative for: Lupus Erythematosus; Raynauds; Scleroderma Integumentary (Skin) Medical History: Negative for: History of Burn Musculoskeletal Medical History: Positive for: Osteoarthritis Negative for: Gout; Rheumatoid Arthritis; Osteomyelitis Neurologic Medical History: Positive for: Neuropathy Negative for: Dementia; Quadriplegia; Paraplegia; Seizure Disorder Oncologic Medical History: Negative for: Received Chemotherapy; Received Radiation Psychiatric Medical History: Negative for: Anorexia/bulimia; Confinement Anxiety Immunizations Pneumococcal Vaccine: Received Pneumococcal Vaccination: Yes Received Pneumococcal Vaccination On or After 60th Birthday: No Implantable Devices Yes Hospitalization / Surgery History [facility]Sur[facility] - Acute Resp Failure cardiac cath with stent  12/19/18 Family and Social History Cancer: Yes - Father; Diabetes: Yes -  Father,Paternal Grandparents; Heart Disease: Yes - Mother; Hereditary Spherocytosis: No; Hypertension: Yes - Mother; Kidney Disease: Yes - Maternal Grandparents; Lung Disease: Yes - Father; Seizures: No; Stroke: Yes - Father; Thyroid Problems: No; Tuberculosis: No; Current every day smoker - Cigars 1 PPD; Marital Status - Divorced; Alcohol Use: Rarely; Drug Use: Prior History - Cocaine; Caffeine Use: Rarely; Financial Concerns: No; Food, Clothing or Shelter Needs: No; Support System Lacking: No; Transportation Concerns: No Electronic Signature(s) Signed: 10/20/2021 3:46:11 PM By: Russell Shan DO Signed: 10/23/2021 1:10:23 PM By: Russell Hammock RN Entered By: Russell Collier on 10/20/2021 15:43:45 -------------------------------------------------------------------------------- SuperBill Details Patient Name: Date of Service: Russell Collier 10/20/2021 Medical Record Number: [id number] Patient Account Number: [id number] Date of Birth/Sex: Treating RN: 09-15-1960 (61 y.o. Russell Collier, Russell Collier Primary Care Provider: Alen Bleacher Other Clinician: Referring Provider: Treating Provider/Extender: Russell Collier in Treatment: 9 Diagnosis Coding ICD-10 Codes Code Description [phone] Non-pressure chronic ulcer of other part of left lower leg with fat layer exposed I87.312 Chronic venous hypertension (idiopathic) with ulcer of left lower extremity I73.89 Other specified peripheral vascular diseases E11.622 Type 2 diabetes mellitus with other skin ulcer I25.10 Atherosclerotic heart disease of native coronary artery without angina pectoris Facility Procedures CPT4 Code: [id number] Description: 09643 - DEB SUBQ TISSUE 20 SQ CM/< ICD-10 Diagnosis Description L97.822 Non-pressure chronic ulcer of other part of left lower leg with fat layer expo Modifier: sed Quantity: 1 Physician  Procedures Electronic Signature(s) Signed: 10/20/2021 3:46:11 PM By: Russell Shan DO Entered By: Russell Collier on 10/20/2021 15:45:42

## 2021-10-26 DIAGNOSIS — M5136 Other intervertebral disc degeneration, lumbar region: Secondary | ICD-10-CM | POA: Diagnosis not present

## 2021-10-27 ENCOUNTER — Encounter (HOSPITAL_BASED_OUTPATIENT_CLINIC_OR_DEPARTMENT_OTHER): Payer: Medicaid Other | Attending: Internal Medicine | Admitting: Internal Medicine

## 2021-10-27 DIAGNOSIS — G4733 Obstructive sleep apnea (adult) (pediatric): Secondary | ICD-10-CM | POA: Diagnosis not present

## 2021-10-27 DIAGNOSIS — F17290 Nicotine dependence, other tobacco product, uncomplicated: Secondary | ICD-10-CM | POA: Insufficient documentation

## 2021-10-27 DIAGNOSIS — E11622 Type 2 diabetes mellitus with other skin ulcer: Secondary | ICD-10-CM | POA: Diagnosis not present

## 2021-10-27 DIAGNOSIS — I872 Venous insufficiency (chronic) (peripheral): Secondary | ICD-10-CM | POA: Diagnosis not present

## 2021-10-27 DIAGNOSIS — M5136 Other intervertebral disc degeneration, lumbar region: Secondary | ICD-10-CM | POA: Diagnosis not present

## 2021-10-27 DIAGNOSIS — I2510 Atherosclerotic heart disease of native coronary artery without angina pectoris: Secondary | ICD-10-CM | POA: Diagnosis not present

## 2021-10-27 DIAGNOSIS — L97822 Non-pressure chronic ulcer of other part of left lower leg with fat layer exposed: Secondary | ICD-10-CM | POA: Insufficient documentation

## 2021-10-27 DIAGNOSIS — I87312 Chronic venous hypertension (idiopathic) with ulcer of left lower extremity: Secondary | ICD-10-CM | POA: Diagnosis not present

## 2021-10-27 NOTE — Progress Notes (Signed)
Russell, Collier ([id number]) Visit Report for 10/27/2021 Chief Complaint Document Details Patient Name: Date of Service: Russell Collier 10/27/2021 3:15 PM Medical Record Number: [id number] Patient Account Number: [id number] Date of Birth/Sex: Treating RN: 12/19/1960 (61 y.o. M) Primary Care Provider: Alen Bleacher Other Clinician: Referring Provider: Treating Provider/Extender: Jannet Askew in Treatment: 10 Information Obtained from: Patient Chief Complaint Left anterior LE Ulcer Electronic Signature(s) Signed: 10/27/2021 4:32:10 PM By: Kalman Shan DO Entered By: Kalman Shan on 10/27/2021 15:52:03 -------------------------------------------------------------------------------- HPI Details Patient Name: Date of Service: Russell Collier 10/27/2021 3:15 PM Medical Record Number: [id number] Patient Account Number: [id number] Date of Birth/Sex: Treating RN: [birth year] (61 y.o. M) Primary Care Provider: Alen Bleacher Other Clinician: Referring Provider: Treating Provider/Extender: Jannet Askew in Treatment: 10 History of Present Illness HPI Description: ADMISSION 09/14/2018 This is a 61 year old man who traumatized his left anterior tibia area on the bathtub. He describes this initially as a bruise that subsequently reopened. He has been seen in the ER several times and has had at least 3 courses of antibiotics including doxy and Keflex twice. He has been using Neosporin and peroxide on this. Past medical history includes obstructive sleep apnea, cellulitis, hypertension, coronary artery disease, peripheral neuropathy [nondiabetic], currently on a heart murmur for atypical chest pain and bradycardia. 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 ARTERIAL STUDIES done in March 2020 showed an ABI on the left of 0.93, TBI on the left slightly reduced at 0.42. On the right his ABI was 0.95 with a TBI of 0.51 he has monophasic waveforms in the  lower extremities bilaterally. Interpreted on the left this showing mild left lower extremity arterial disease as well as on the right. 09/25/18-Patient returns at 1 week to the clinic, noted that arterial studies done in March 2020. We have been using Iodoflex to the wound on the left leg, patient has significant degree of pain and declines to have any debridement 8/21; the patient has not been here in 3 weeks. Since he was last here he was admitted to hospital on 8/4. He was felt to have stable angina. He has since been to his cardiologist. I note that he has an EF of about 48%. He has a venous wound on the left anterior mid tibia area probably with lymphedema. We have been using Iodoflex. He does not tolerate debridement well. He tells me that his cardiologist has been adjusting his Lasix and then the wraps fall down he tries to pull them up. He is describing pain making it difficult for him to function. 9/3; patient is was hospitalized since he was last here with COPD exacerbation. Came in with Aquacel Ag on the wound. The wound actually looks improved. 9/17; patient was apparently in the ER again with breathing issues and blood pressure issues. He was not admitted. We are using Sorbact of the wound with nice improvement dimensions are better 10/1; the patient's wound is smaller and appears to have a reasonable healthy surface. We are using Sorbact 10/8; the wound is smaller although there is still an area with depth and necrotic debris at the base. We are using Sorbact 10/15; not much change in the wound area. We have been using Sorbact. 10/22; small wound area using endoform since last week 11/5; Russell Collier arrived today in a healed state. Left leg and wrapped. He still does not have compression stockings Readmission: 08-12-2021 upon evaluation today patient presents for evaluation here in the clinic he has been  seen last in 2020 at that point Dr. Dellia Nims was the treating physician. Subsequently  the patient tells me that he does have a history of peripheral vascular disease, chronic venous insufficiency, diabetes mellitus type 2, and coronary artery disease. Unfortunately he has had some issues with his right leg he recently has been seen [facility] and they did perform an arteriogram on the right leg I believe even putting a stent in. With that being said that leg seems to be doing well he mainly just needs a compression sock. In regard to the left leg which is the one in question he tells me the same day he had a surgery he hit the left leg on a piece of furniture on the anterior shin. This caused a area that seems to be a hematoma present at this point. With that being said the patient is having significant discomfort. He tells me. He has been taking ibuprofen which he should not be doing I discussed that with him today. Tylenol is really only should be taken being on the Eliquis. He also tells me that he has not really been put anything on other than a Band-Aid I really think he may benefit from some light compression I do not want to do anything too tight until we get a good review of his arterial flow as his ABIs are not good we got here in the office today was 0.75 on the left 0.65 on the right and to be honest he did not have palpable pulses that I could find. He does not appear to have had an arterial study since 2020 as noted above by Dr. Dellia Nims. 08-19-2021 upon evaluation today patient appears to be doing okay in regard to his wound although he does appear to have some fluid buildup underneath this area. Again I am a little reluctant to try to open this up right now due to the fact that the patient does not appear to have good blood flow based on our initial inspection during the clinic evaluation today. Again he does see his vascular surgeon today. We did not have any palpable pulses that we could find last time I saw him and overall I think that he probably  just needs to be seen in order to evaluate and see where things stand. 08-26-2021 upon evaluation today patient appears to be doing well currently in regard to his wound. Has been tolerating the dressing changes without complication. Fortunately we do not have a big open area but he does have some fluid underneath in the center this looks like it might start draining on its own soon which would be ideal. With that being said right now he has been using the Curlex and Coban wrap which she does tolerate okay. He is going for an arteriogram on 14 July. After that if we need to open this we should be able to presuming they get his blood flow going appropriately. 09-02-2021 upon evaluation today patient appears to be doing about the same in regard to his wound this is starting to drain a lot more so it is definitely opening into something a little bit deeper. With that being said I do see signs of improvement to some degree and that there is not as much fluid buildup down in the base of the wound. I am very pleased with that. He is supposed to be having his vascular intervention on Monday after that hopefully will have better blood flow we will  try to get this open to be getting draining completely and hopefully get this to resolve. He is not really wearing the compression right now because of discomfort that may again also be partially related to the fact that leg does not have sufficient blood flow to be able and heal this appropriately. Again once we get good blood flow hopefully the pain will not be as much we will get better compression on him as well. 09-09-2021 patient underwent the balloon angioplasty of the left peroneal artery on 09-07-2021. It sounds like that the area of stenosis was resolved based on what I am seeing at this point in time. It looks like the Dr. Gwenlyn Saran was satisfied with the blood flow postprocedure which was awesome. The patient had no issues with complication during or following the  procedure. He is actually present for evaluation here in the clinic today and the good news is the wound is already looking much better and has only been a couple of days. Fortunately he is not having a terrible amount of pain though is still having some discomfort at this time. I definitely think this is something that we can manage quite nicely. 7/26; the patient underwent a balloon angioplasty of the left peroneal artery on 09/07/2021 he tolerated this well he has a small open area remaining. Using silver alginate and 3 layer compression 8/3; patient presents for follow-up. We have been using collagen under 3 layer compression. He has no issues or complaints today. 8/11; patient presents for follow-up. We have been using collagen under 3 layer compression again he has no issues or complaints today. He reports improvement in wound healing. 8/15; patient presents for follow-up. We have been using collagen under 3 layer compression. He has no issues or complaints today. He has tolerated the wrap well. 8/22; patient presents for follow-up. We have been using collagen under the 3 layer compression. He has tolerated this well. He has no issues or complaints today. 8/29; patient presents for follow-up. We have been using endoform under 3 layer compression. He has no issues or complaints today. 9/5; patient presents for follow-up. We have been using endoform under 3 layer compression. Has no issues or complaints today. Electronic Signature(s) Signed: 10/27/2021 4:32:10 PM By: Kalman Shan DO Entered By: Kalman Shan on 10/27/2021 15:52:24 -------------------------------------------------------------------------------- Physical Exam Details Patient Name: Date of Service: Russell Collier 10/27/2021 3:15 PM Medical Record Number: [id number] Patient Account Number: [id number] Date of Birth/Sex: Treating RN: 09/21/1960 (61 y.o. M) Primary Care Provider: Alen Bleacher Other Clinician: Referring  Provider: Treating Provider/Extender: Jannet Askew in Treatment: 10 Constitutional respirations regular, non-labored and within target range for patient.. Cardiovascular 2+ dorsalis pedis/posterior tibialis pulses. Psychiatric pleasant and cooperative. Notes Left leg: T the anterior aspect there is epithelization to the previous wound site. o Electronic Signature(s) Signed: 10/27/2021 4:32:10 PM By: Kalman Shan DO Entered By: Kalman Shan on 10/27/2021 15:53:00 -------------------------------------------------------------------------------- Physician Orders Details Patient Name: Date of Service: [place], RO Collier 10/27/2021 3:15 PM Medical Record Number: [id number] Patient Account Number: [id number] Date of Birth/Sex: Treating RN: 08/13/1960 (61 y.o. [name], [name] Primary Care Provider: Alen Bleacher Other Clinician: Referring Provider: Treating Provider/Extender: Jannet Askew in Treatment: 10 Verbal / Phone Orders: No Diagnosis Coding Discharge From [facility] Discharge from [place] Edema Control - Lymphedema / SCD / Other Elevate legs to the level of the heart or above for 30 minutes daily and/or when sitting, a frequency  of: Avoid standing for long periods of time. Patient to wear own compression stockings every day. Electronic Signature(s) Signed: 10/27/2021 4:32:10 PM By: Kalman Shan DO Entered By: Kalman Shan on 10/27/2021 15:53:10 -------------------------------------------------------------------------------- Problem List Details Patient Name: Date of Service: [place], RO Collier 10/27/2021 3:15 PM Medical Record Number: [id number] Patient Account Number: [id number] Date of Birth/Sex: Treating RN: May 18, 1960 (62 y.o. M) Primary Care Provider: Alen Bleacher Other Clinician: Referring Provider: Treating Provider/Extender: Jannet Askew in Treatment: 10 Active  Problems ICD-10 Encounter Code Description Active Date MDM Diagnosis [phone] Non-pressure chronic ulcer of other part of left lower leg with fat layer exposed6/21/2023 No Yes I87.312 Chronic venous hypertension (idiopathic) with ulcer of left lower extremity 10/13/2021 No Yes I73.89 Other specified peripheral vascular diseases 08/12/2021 No Yes E11.622 Type 2 diabetes mellitus with other skin ulcer 08/12/2021 No Yes I25.10 Atherosclerotic heart disease of native coronary artery without angina pectoris 08/12/2021 No Yes Inactive Problems Resolved Problems Electronic Signature(s) Signed: 10/27/2021 4:32:10 PM By: Kalman Shan DO Entered By: Kalman Shan on 10/27/2021 15:51:47 -------------------------------------------------------------------------------- Progress Note Details Patient Name: Date of Service: [place], [place] 10/27/2021 3:15 PM Medical Record Number: [id number] Patient Account Number: [id number] Date of Birth/Sex: Treating RN: 1960-03-25 (61 y.o. M) Primary Care Provider: Alen Bleacher Other Clinician: Referring Provider: Treating Provider/Extender: Jannet Askew in Treatment: 10 Subjective Chief Complaint Information obtained from Patient Left anterior LE Ulcer History of Present Illness (HPI) ADMISSION 09/14/2018 This is a 61 year old man who traumatized his left anterior tibia area on the bathtub. He describes this initially as a bruise that subsequently reopened. He has been seen in the ER several times and has had at least 3 courses of antibiotics including doxy and Keflex twice. He has been using Neosporin and peroxide on this. Past medical history includes obstructive sleep apnea, cellulitis, hypertension, coronary artery disease, peripheral neuropathy [nondiabetic], currently on a heart murmur for atypical chest pain and bradycardia. 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 ARTERIAL STUDIES done in March 2020 showed an ABI on the left  of 0.93, TBI on the left slightly reduced at 0.42. On the right his ABI was 0.95 with a TBI of 0.51 he has monophasic waveforms in the lower extremities bilaterally. Interpreted on the left this showing mild left lower extremity arterial disease as well as on the right. 09/25/18-Patient returns at 1 week to the clinic, noted that arterial studies done in March 2020. We have been using Iodoflex to the wound on the left leg, patient has significant degree of pain and declines to have any debridement 8/21; the patient has not been here in 3 weeks. Since he was last here he was admitted to hospital on 8/4. He was felt to have stable angina. He has since been to his cardiologist. I note that he has an EF of about 48%. He has a venous wound on the left anterior mid tibia area probably with lymphedema. We have been using Iodoflex. He does not tolerate debridement well. He tells me that his cardiologist has been adjusting his Lasix and then the wraps fall down he tries to pull them up. He is describing pain making it difficult for him to function. 9/3; patient is was hospitalized since he was last here with COPD exacerbation. Came in with Aquacel Ag on the wound. The wound actually looks improved. 9/17; patient was apparently in the ER again with breathing issues and blood pressure issues. He was not admitted. We are using Sorbact  of the wound with nice improvement dimensions are better 10/1; the patient's wound is smaller and appears to have a reasonable healthy surface. We are using Sorbact 10/8; the wound is smaller although there is still an area with depth and necrotic debris at the base. We are using Sorbact 10/15; not much change in the wound area. We have been using Sorbact. 10/22; small wound area using endoform since last week 11/5; Mr. Fill arrived today in a healed state. Left leg and wrapped. He still does not have compression stockings Readmission: 08-12-2021 upon evaluation today patient  presents for evaluation here in the clinic he has been seen last in 2020 at that point Dr. Dellia Nims was the treating physician. Subsequently the patient tells me that he does have a history of peripheral vascular disease, chronic venous insufficiency, diabetes mellitus type 2, and coronary artery disease. Unfortunately he has had some issues with his right leg he recently has been seen by [facility] and they did perform an arteriogram on the right leg I believe even putting a stent in. With that being said that leg seems to be doing well he mainly just needs a compression sock. In regard to the left leg which is the one in question he tells me the same day he had a surgery he hit the left leg on a piece of furniture on the anterior shin. This caused a area that seems to be a hematoma present at this point. With that being said the patient is having significant discomfort. He tells me. He has been taking ibuprofen which he should not be doing I discussed that with him today. Tylenol is really only should be taken being on the Eliquis. He also tells me that he has not really been put anything on other than a Band-Aid I really think he may benefit from some light compression I do not want to do anything too tight until we get a good review of his arterial flow as his ABIs are not good we got here in the office today was 0.75 on the left 0.65 on the right and to be honest he did not have palpable pulses that I could find. He does not appear to have had an arterial study since 2020 as noted above by Dr. Dellia Nims. 08-19-2021 upon evaluation today patient appears to be doing okay in regard to his wound although he does appear to have some fluid buildup underneath this area. Again I am a little reluctant to try to open this up right now due to the fact that the patient does not appear to have good blood flow based on our initial inspection during the clinic evaluation today. Again he does see his  vascular surgeon today. We did not have any palpable pulses that we could find last time I saw him and overall I think that he probably just needs to be seen in order to evaluate and see where things stand. 08-26-2021 upon evaluation today patient appears to be doing well currently in regard to his wound. Has been tolerating the dressing changes without complication. Fortunately we do not have a big open area but he does have some fluid underneath in the center this looks like it might start draining on its own soon which would be ideal. With that being said right now he has been using the Curlex and Coban wrap which she does tolerate okay. He is going for an arteriogram on 14 July. After that if we need  to open this we should be able to presuming they get his blood flow going appropriately. 09-02-2021 upon evaluation today patient appears to be doing about the same in regard to his wound this is starting to drain a lot more so it is definitely opening into something a little bit deeper. With that being said I do see signs of improvement to some degree and that there is not as much fluid buildup down in the base of the wound. I am very pleased with that. He is supposed to be having his vascular intervention on Monday after that hopefully will have better blood flow we will try to get this open to be getting draining completely and hopefully get this to resolve. He is not really wearing the compression right now because of discomfort that may again also be partially related to the fact that leg does not have sufficient blood flow to be able and heal this appropriately. Again once we get good blood flow hopefully the pain will not be as much we will get better compression on him as well. 09-09-2021 patient underwent the balloon angioplasty of the left peroneal artery on 09-07-2021. It sounds like that the area of stenosis was resolved based on what I am seeing at this point in time. It looks like the Dr. Gwenlyn Saran  was satisfied with the blood flow postprocedure which was awesome. The patient had no issues with complication during or following the procedure. He is actually present for evaluation here in the clinic today and the good news is the wound is already looking much better and has only been a couple of days. Fortunately he is not having a terrible amount of pain though is still having some discomfort at this time. I definitely think this is something that we can manage quite nicely. 7/26; the patient underwent a balloon angioplasty of the left peroneal artery on 09/07/2021 he tolerated this well he has a small open area remaining. Using silver alginate and 3 layer compression 8/3; patient presents for follow-up. We have been using collagen under 3 layer compression. He has no issues or complaints today. 8/11; patient presents for follow-up. We have been using collagen under 3 layer compression again he has no issues or complaints today. He reports improvement in wound healing. 8/15; patient presents for follow-up. We have been using collagen under 3 layer compression. He has no issues or complaints today. He has tolerated the wrap well. 8/22; patient presents for follow-up. We have been using collagen under the 3 layer compression. He has tolerated this well. He has no issues or complaints today. 8/29; patient presents for follow-up. We have been using endoform under 3 layer compression. He has no issues or complaints today. 9/5; patient presents for follow-up. We have been using endoform under 3 layer compression. Has no issues or complaints today. Patient History Information obtained from Patient. Family History Cancer - Father, Diabetes - Father,Paternal Grandparents, Heart Disease - Mother, Hypertension - Mother, Kidney Disease - Maternal Grandparents, Lung Disease - Father, Stroke - Father, No family history of Hereditary Spherocytosis, Seizures, Thyroid Problems, Tuberculosis. Social  History Current every day smoker - Cigars 1 PPD, Marital Status - Divorced, Alcohol Use - Rarely, Drug Use - Prior History - Cocaine, Caffeine Use - Rarely. Medical History Eyes Denies history of Cataracts, Glaucoma, Optic Neuritis Hematologic/Lymphatic Patient has history of Hemophilia - Borderline Denies history of Anemia, Human Immunodeficiency Virus, Lymphedema, Sickle Cell Disease Respiratory Patient has history of Asthma, Sleep Apnea Denies history of  Aspiration, Chronic Obstructive Pulmonary Disease (COPD), Pneumothorax, Tuberculosis Cardiovascular Patient has history of Angina - Atypical, Congestive Heart Failure, Coronary Artery Disease, Hypertension, Peripheral Arterial Disease Denies history of Arrhythmia, Deep Vein Thrombosis, Hypotension, Myocardial Infarction, Peripheral Venous Disease, Phlebitis, Vasculitis Gastrointestinal Denies history of Cirrhosis , Colitis, Crohnoos, Hepatitis A, Hepatitis B, Hepatitis C Endocrine Patient has history of Type II Diabetes Denies history of Type I Diabetes Genitourinary Denies history of End Stage Renal Disease Immunological Denies history of Lupus Erythematosus, Raynaudoos, Scleroderma Integumentary (Skin) Denies history of History of Burn Musculoskeletal Patient has history of Osteoarthritis Denies history of Gout, Rheumatoid Arthritis, Osteomyelitis Neurologic Patient has history of Neuropathy Denies history of Dementia, Quadriplegia, Paraplegia, Seizure Disorder Oncologic Denies history of Received Chemotherapy, Received Radiation Psychiatric Denies history of Anorexia/bulimia, Confinement Anxiety Hospitalization/Surgery History [facility] 09/2018 - Acute Resp Failure. - cardiac cath with stent 12/19/18. 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 Vitals Time Taken: 2:51 PM, Height: 70 in, Weight: 315 lbs, BMI: 45.2, Temperature: 98.7 F, Pulse: 77 bpm, Respiratory Rate: 18 breaths/min, Blood Pressure: 119/74 mmHg. Cardiovascular 2+ dorsalis pedis/posterior tibialis pulses. Psychiatric pleasant and cooperative. General Notes: Left leg: T the anterior aspect there is epithelization to the previous wound site. o Integumentary (Hair, Skin) Wound #2 status is Healed - Epithelialized. Original cause of wound was Trauma. The date acquired was: 08/03/2021. The wound has been in treatment 10 weeks. The wound is located on the Left,Anterior Lower Leg. The wound measures 0cm length x 0cm width x 0cm depth; 0cm^2 area and 0cm^3 volume. There is Fat Layer (Subcutaneous Tissue) exposed. There is no tunneling or undermining noted. There is a large amount of serous drainage noted. The wound margin is distinct with the outline attached to the wound base. There is no granulation within the wound bed. There is no necrotic tissue within the wound bed. 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 His wound is healed. I recommended daily compression stockings. He may follow-up as needed. Plan Discharge From [facility] Services: Discharge from [place] Edema Control - Lymphedema / SCD / Other: Elevate legs to the level of the heart or above for 30 minutes daily and/or when sitting, a frequency of: Avoid standing for long periods of time. Patient to wear own compression stockings every day. 1. Daily compression stockings 2. Discharge from clinic due to  closed wound 3. Follow-up as needed Electronic Signature(s) Signed: 10/27/2021 4:32:10 PM By: Kalman Shan DO Entered By: Kalman Shan on 10/27/2021 15:53:53 -------------------------------------------------------------------------------- HxROS Details Patient Name: Date of Service: [place], RO Collier 10/27/2021 3:15 PM Medical Record Number: [id number] Patient Account Number: [id number] Date of Birth/Sex: Treating RN: [birth year] (61 y.o. M) Primary Care Provider: Alen Bleacher Other Clinician: Referring Provider: Treating Provider/Extender: Jannet Askew in Treatment: 10 Information Obtained From Patient Eyes Medical History: Negative for: Cataracts; Glaucoma; Optic Neuritis Hematologic/Lymphatic Medical History: Positive for: Hemophilia - Borderline Negative for: Anemia; Human Immunodeficiency Virus; Lymphedema; Sickle Cell Disease Respiratory Medical History: Positive for: Asthma; Sleep Apnea Negative for: Aspiration; Chronic Obstructive Pulmonary Disease (COPD);  Pneumothorax; Tuberculosis Past Medical History Notes: Pulmonary Embolism Cardiovascular Medical History: Positive for: Angina - Atypical; Congestive Heart Failure; Coronary Artery Disease; Hypertension; Peripheral Arterial Disease Negative for: Arrhythmia; Deep Vein Thrombosis; Hypotension; Myocardial Infarction; Peripheral Venous Disease; Phlebitis; Vasculitis Past Medical History Notes: Hyperlipidemia, Bradycardia, 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 Gastrointestinal Medical History: Negative for: Cirrhosis ; Colitis; Crohns; Hepatitis A; Hepatitis B; Hepatitis C Endocrine Medical History: Positive for: Type II Diabetes Negative for: Type I Diabetes Treated with: Oral agents Blood sugar tested every day: No Genitourinary Medical History: Negative for: End Stage Renal Disease Immunological Medical History: Negative for: Lupus Erythematosus; Raynauds; Scleroderma Integumentary  (Skin) Medical History: Negative for: History of Burn Musculoskeletal Medical History: Positive for: Osteoarthritis Negative for: Gout; Rheumatoid Arthritis; Osteomyelitis Neurologic Medical History: Positive for: Neuropathy Negative for: Dementia; Quadriplegia; Paraplegia; Seizure Disorder Oncologic Medical History: Negative for: Received Chemotherapy; Received Radiation Psychiatric Medical History: Negative for: Anorexia/bulimia; Confinement Anxiety Immunizations Pneumococcal Vaccine: Received Pneumococcal Vaccination: Yes Received Pneumococcal Vaccination On or After 60th Birthday: No Implantable Devices Yes Hospitalization / Surgery History Type of Hospitalization/Surgery [facility] 09/2018 - Acute Resp Failure cardiac cath with stent 12/19/18 Family and Social History Cancer: Yes - Father; Diabetes: Yes - Father,Paternal Grandparents; Heart Disease: Yes - Mother; Hereditary Spherocytosis: No; Hypertension: Yes - Mother; Kidney Disease: Yes - Maternal Grandparents; Lung Disease: Yes - Father; Seizures: No; Stroke: Yes - Father; Thyroid Problems: No; Tuberculosis: No; Current every day smoker - Cigars 1 PPD; Marital Status - Divorced; Alcohol Use: Rarely; Drug Use: Prior History - Cocaine; Caffeine Use: Rarely; Financial Concerns: No; Food, Clothing or Shelter Needs: No; Support System Lacking: No; Transportation Concerns: No Electronic Signature(s) Signed: 10/27/2021 4:32:10 PM By: Kalman Shan DO Entered By: Kalman Shan on 10/27/2021 15:52:29 -------------------------------------------------------------------------------- SuperBill Details Patient Name: Date of Service: [name], RO Collier 10/27/2021 Medical Record Number: [id number] Patient Account Number: [id number] Date of Birth/Sex: Treating RN: 1960-03-11 (61 y.o. [name], [name] Primary Care Provider: Alen Bleacher Other Clinician: Referring Provider: Treating Provider/Extender: Jannet Askew in Treatment: 10 Diagnosis Coding ICD-10 Codes Code Description 3520162133 Non-pressure chronic ulcer of other part of left lower leg with fat layer exposed I87.312 Chronic venous hypertension (idiopathic) with ulcer of left lower extremity I73.89 Other specified peripheral vascular diseases E11.622 Type 2 diabetes mellitus with other skin ulcer I25.10 Atherosclerotic heart disease of native coronary artery without angina pectoris Facility Procedures CPT4 Code: [id number] Description: 9213 - WOUND CARE VISIT-LEV 3 EST PT Modifier: 1 Quantity: Physician Procedures : CPT4 Code Description Modifier [id number] - WC PHYS LEVEL 3 - EST PT ICD-10 Diagnosis Description L97.822 Non-pressure chronic ulcer of other part of left lower leg with fat layer exposed I87.312 Chronic venous hypertension (idiopathic) with ulcer  of left lower extremity E11.622 Type 2 diabetes mellitus with other skin ulcer Quantity: 1 Electronic Signature(s) Signed: 10/27/2021 4:32:10 PM By: Kalman Shan DO Entered By: Kalman Shan on 10/27/2021 15:54:13

## 2021-10-28 DIAGNOSIS — M5136 Other intervertebral disc degeneration, lumbar region: Secondary | ICD-10-CM | POA: Diagnosis not present

## 2021-10-28 NOTE — Progress Notes (Addendum)
Russell Collier, Russell Collier ([id number]) [id number] Page 1 of 8 Visit Report for 10/27/2021 Arrival Information Details Patient Name: Date of Service: Russell Collier 10/27/2021 3:15 PM Medical Record Number: [id number] Patient Account Number: [id number] Date of Birth/Sex: Treating RN: [birth year] (61 y.o. M) Primary Care Nashawn Hillock: Alen Bleacher Other Clinician: Referring Laine Fonner: Treating Lenise Jr/Extender: [facility], Bethany Weeks in Treatment: 10 Visit Information History Since Last Visit Added or deleted any medications: No Patient Arrived: Russell Collier Any new allergies or adverse reactions: No Arrival Time: 14:48 Had a fall or experienced change in No Accompanied By: self activities of daily living that may affect Transfer Assistance: None risk of falls: Patient Identification Verified: Yes Signs or symptoms of abuse/neglect since last visito No Secondary Verification Process Completed: Yes Hospitalized since last visit: No Patient Requires Transmission-Based Precautions: No Implantable device outside of the clinic excluding No Patient Has Alerts: Yes cellular tissue based products placed in the center Patient Alerts: Patient on Blood Thinner since last visit: Has Compression in Place as Prescribed: No Pain Present Now: No Electronic Signature(s) Signed: 10/28/2021 4:42:40 PM By: Erenest Blank Entered By: Erenest Blank on 10/27/2021 14:49:35 -------------------------------------------------------------------------------- Clinic Level of Care Assessment Details Patient Name: Date of Service: Russell Collier 10/27/2021 3:15 PM Medical Record Number: [id number] Patient Account Number: [id number] Date of Birth/Sex: Treating RN: September 17, 1960 (61 y.o. [name], [name] Primary Care Eldoris Beiser: Alen Bleacher Other Clinician: Referring Adisen Bennion: Treating Anaja Monts/Extender: [facility], Bethany Weeks in Treatment: 10 Clinic Level of Care Assessment  Items TOOL 4 Quantity Score X- 1 0 Use when only an EandM is performed on FOLLOW-UP visit ASSESSMENTS - Nursing Assessment / Reassessment X- 1 10 Reassessment of Co-morbidities (includes updates in patient status) X- 1 5 Reassessment of Adherence to Treatment Plan ASSESSMENTS - Wound and Skin A ssessment / Reassessment X - Simple Wound Assessment / Reassessment - one wound 1 5 '[]'$  - 0 Complex Wound Assessment / Reassessment - multiple wounds '[]'$  - 0 Dermatologic / Skin Assessment (not related to wound area) ASSESSMENTS - Focused Assessment X- 1 5 Circumferential Edema Measurements - multi extremities '[]'$  - 0 Nutritional Assessment / Counseling / Intervention Russell Collier, Russell Collier ([id number]) [id number] Page 2 of 8 '[]'$  - 0 Lower Extremity Assessment (monofilament, tuning fork, pulses) '[]'$  - 0 Peripheral Arterial Disease Assessment (using hand held doppler) ASSESSMENTS - Ostomy and/or Continence Assessment and Care '[]'$  - 0 Incontinence Assessment and Management '[]'$  - 0 Ostomy Care Assessment and Management (repouching, etc.) PROCESS - Coordination of Care X - Simple Patient / Family Education for ongoing care 1 15 '[]'$  - 0 Complex (extensive) Patient / Family Education for ongoing care X- 1 10 Staff obtains Programmer, systems, Records, T Results / Process Orders est '[]'$  - 0 Staff telephones HHA, Nursing Homes / Clarify orders / etc '[]'$  - 0 Routine Transfer to another Facility (non-emergent condition) '[]'$  - 0 Routine Hospital Admission (non-emergent condition) '[]'$  - 0 New Admissions / Biomedical engineer / Ordering NPWT Apligraf, etc. , '[]'$  - 0 Emergency Hospital Admission (emergent condition) X- 1 10 Simple Discharge Coordination '[]'$  - 0 Complex (extensive) Discharge Coordination PROCESS - Special Needs '[]'$  - 0 Pediatric / Minor Patient Management '[]'$  - 0 Isolation Patient Management '[]'$  - 0 Hearing / Language / Visual special needs '[]'$  - 0 Assessment of  Community assistance (transportation, D/C planning, etc.) '[]'$  - 0 Additional assistance / Altered mentation '[]'$  - 0 Support Surface(s) Assessment (bed, cushion, seat, etc.) INTERVENTIONS - Wound Cleansing / Measurement X - Simple  Wound Cleansing - one wound 1 5 '[]'$  - 0 Complex Wound Cleansing - multiple wounds X- 1 5 Wound Imaging (photographs - any number of wounds) '[]'$  - 0 Wound Tracing (instead of photographs) X- 1 5 Simple Wound Measurement - one wound '[]'$  - 0 Complex Wound Measurement - multiple wounds INTERVENTIONS - Wound Dressings X - Small Wound Dressing one or multiple wounds 1 10 '[]'$  - 0 Medium Wound Dressing one or multiple wounds '[]'$  - 0 Large Wound Dressing one or multiple wounds '[]'$  - 0 Application of Medications - topical '[]'$  - 0 Application of Medications - injection INTERVENTIONS - Miscellaneous '[]'$  - 0 External ear exam '[]'$  - 0 Specimen Collection (cultures, biopsies, blood, body fluids, etc.) '[]'$  - 0 Specimen(s) / Culture(s) sent or taken to Lab for analysis '[]'$  - 0 Patient Transfer (multiple staff / Civil Service fast streamer / Similar devices) '[]'$  - 0 Simple Staple / Suture removal (25 or less) '[]'$  - 0 Complex Staple / Suture removal (26 or more) '[]'$  - 0 Hypo / Hyperglycemic Management (close monitor of Blood Glucose) Russell Collier, Russell Collier ([id number]) [phone].pdf Page 3 of 8 '[]'$  - 0 Ankle / Brachial Index (ABI) - do not check if billed separately X- 1 5 Vital Signs Has the patient been seen at the hospital within the last three years: Yes Total Score: 90 Level Of Care: New/Established - Level 3 Electronic Signature(s) Signed: 10/27/2021 4:25:50 PM By: Rhae Hammock RN Entered By: Rhae Hammock on 10/27/2021 15:48:42 -------------------------------------------------------------------------------- Encounter Discharge Information Details Patient Name: Date of Service: [place], Russell Collier 10/27/2021 3:15 PM Medical Record Number: [id number] Patient  Account Number: [id number] Date of Birth/Sex: Treating RN: 12/07/1960 (62 y.o. [name], [name] Primary Care Yailin Biederman: Alen Bleacher Other Clinician: Referring Kushal Saunders: Treating Allyne Hebert/Extender: [facility], Jairo Ben in Treatment: 10 Encounter Discharge Information Items Discharge Condition: Stable Ambulatory Status: Cane Discharge Destination: Home Transportation: Private Auto Accompanied By: self Schedule Follow-up Appointment: Yes Clinical Summary of Care: Patient Declined Electronic Signature(s) Signed: 10/27/2021 4:25:50 PM By: Rhae Hammock RN Entered By: Rhae Hammock on 10/27/2021 15:49:07 -------------------------------------------------------------------------------- Lower Extremity Assessment Details Patient Name: Date of Service: Russell Collier, Russell Collier 10/27/2021 3:15 PM Medical Record Number: [id number] Patient Account Number: [id number] Date of Birth/Sex: Treating RN: [birth year] (61 y.o. M) Primary Care Taegen Delker: Alen Bleacher Other Clinician: Referring Chalice Philbert: Treating Roslind Michaux/Extender: [facility], Bethany Weeks in Treatment: 10 Edema Assessment Assessed: [Left: No] [Right: No] Edema: [Left: N] [Right: o] Calf Left: Right: Point of Measurement: 37 cm From Medial Instep 40.5 cm Ankle Left: Right: Point of Measurement: 10 cm From Medial Instep 23.4 cm Vascular Assessment ANAY, RATHE ([id number]) [Right:[id number] Page 4 of 8] Pulses: Dorsalis Pedis Palpable: [Left:Yes] Electronic Signature(s) Signed: 10/28/2021 4:42:40 PM By: Erenest Blank Entered By: Erenest Blank on 10/27/2021 14:59:45 -------------------------------------------------------------------------------- Multi Wound Chart Details Patient Name: Date of Service: Russell Collier 10/27/2021 3:15 PM Medical Record Number: [id number] Patient Account Number: [id number] Date of Birth/Sex: Treating RN: 02/09/1961 (61 y.o. M) Primary  Care Dorlis Judice: Alen Bleacher Other Clinician: Referring Damyiah Moxley: Treating Claudis Giovanelli/Extender: [facility], Bethany Weeks in Treatment: 10 Vital Signs Height(in): 70 Pulse(bpm): 77 Weight(lbs): 315 Blood Pressure(mmHg): 119/74 Body Mass Index(BMI): 45.2 Temperature(F): 98.7 Respiratory Rate(breaths/min): 18 [2:Photos:] [N/A:N/A] Left, Anterior Lower Leg N/A N/A Wound Location: Trauma N/A N/A Wounding Event: Arterial Insufficiency Ulcer N/A N/A Primary Etiology: Hemophilia, Asthma, Sleep Apnea, N/A N/A Comorbid History: Angina, Congestive Heart Failure, Coronary Artery Disease, Hypertension, Peripheral Arterial Disease, Type II Diabetes, Osteoarthritis, Neuropathy  08/03/2021 N/A N/A Date Acquired: 10 N/A N/A Weeks of Treatment: Healed - Epithelialized N/A N/A Wound Status: No N/A N/A Wound Recurrence: Yes N/A N/A Clustered Wound: 1 N/A N/A Clustered Quantity: 0x0x0 N/A N/A Measurements L x W x D (cm) 0 N/A N/A A (cm) : rea 0 N/A N/A Volume (cm) : 100.00% N/A N/A % Reduction in Area: 100.00% N/A N/A % Reduction in Volume: Full Thickness Without Exposed N/A N/A Classification: Support Structures Large N/A N/A Exudate Amount: Serous N/A N/A Exudate Type: amber N/A N/A Exudate Color: Distinct, outline attached N/A N/A Wound Margin: None Present (0%) N/A N/A Granulation Amount: None Present (0%) N/A N/A Necrotic Amount: Fat Layer (Subcutaneous Tissue): Yes N/A N/A Exposed Structures: Fascia: No Tendon: No Muscle: No Joint: No Bone: No Large (67-100%) N/A N/A EpithelializationTYLIQUE, AULL ([id number]) [id number] Page 5 of 8 Treatment Notes Wound #2 (Lower Leg) Wound Laterality: Left, Anterior Cleanser Peri-Wound Care Topical Primary Dressing Secondary Dressing Secured With Compression Wrap Compression Stockings Add-Ons Electronic Signature(s) Signed: 10/27/2021 4:32:10 PM By: Kalman Shan  DO Entered By: Kalman Shan on 10/27/2021 15:51:55 -------------------------------------------------------------------------------- Multi-Disciplinary Care Plan Details Patient Name: Date of Service: Russell Collier, Russell Collier 10/27/2021 3:15 PM Medical Record Number: [id number] Patient Account Number: [id number] Date of Birth/Sex: Treating RN: 1960-08-06 (61 y.o. [name], [name] Primary Care Laquanta Hummel: Alen Bleacher Other Clinician: Referring Jazmin Ley: Treating Draeden Kellman/Extender: Jannette Spanner, Bethany Weeks in Treatment: 10 Active Inactive Electronic Signature(s) Signed: 02/24/2022 5:37:22 PM By: Rhae Hammock RN Previous Signature: 10/27/2021 4:25:50 PM Version By: Rhae Hammock RN Entered By: Rhae Hammock on 10/29/2021 09:59:49 -------------------------------------------------------------------------------- Pain Assessment Details Patient Name: Date of Service: Russell Collier 10/27/2021 3:15 PM Medical Record Number: [id number] Patient Account Number: [id number] Date of Birth/Sex: Treating RN: [birth year] (61 y.o. M) Primary Care Nicey Krah: Alen Bleacher Other Clinician: Referring Christopherjame Carnell: Treating Saralyn Willison/Extender: Jannette Spanner, Bethany Weeks in Treatment: 10 Active Problems Location of Pain Severity and Description of Pain Patient Has Paino No Site Locations [place], [place] ([id number]) [id number] Page 6 of 8 Pain Management and Medication Current Pain Management: Electronic Signature(s) Signed: 10/28/2021 4:42:40 PM By: Erenest Blank Entered By: Erenest Blank on 10/27/2021 14:51:55 -------------------------------------------------------------------------------- Patient/Caregiver Education Details Patient Name: Date of Service: Russell Collier, [place] Collier 9/5/2023andnbsp3:15 PM Medical Record Number: [id number] Patient Account Number: [id number] Date of Birth/Gender: Treating RN: 07-Dec-1960 (61 y.o. Erie Noe Primary Care Physician: Alen Bleacher Other Clinician: Referring Physician: Treating Physician/Extender: Jannette Spanner, Jairo Ben in Treatment: 10 Education Assessment Education Provided To: Patient Education Topics Provided Venous: Methods: Explain/Verbal Responses: Reinforcements needed, State content correctly Electronic Signature(s) Signed: 10/27/2021 4:25:50 PM By: Rhae Hammock RN Entered By: Rhae Hammock on 10/27/2021 15:20:24 -------------------------------------------------------------------------------- Wound Assessment Details Patient Name: Date of Service: Russell Collier 10/27/2021 3:15 PM Medical Record Number: [id number] Patient Account Number: [id number] Date of Birth/Sex: Treating RN: 09-05-1960 (61 y.o. [name], [name] Primary Care Valery Chance: Alen Bleacher Other Clinician: Referring Juwana Thoreson: Treating Zackerie Sara/Extender: [facility], [place] ([id number]) [id number] Page 7 of 8 Weeks in Treatment: 10 Wound Status Wound Number: 2 Primary Arterial Insufficiency Ulcer Etiology: Wound Location: Left, Anterior Lower Leg Wound Healed - Epithelialized Wounding Event: Trauma Status: Date Acquired: 08/03/2021 Comorbid Hemophilia, Asthma, Sleep Apnea, Angina, Congestive Heart Weeks Of Treatment: 10 History: Failure, Coronary Artery Disease, Hypertension, Peripheral Arterial Clustered Wound: Yes Disease, Type II Diabetes, Osteoarthritis, Neuropathy Photos Wound Measurements Length: (cm) Width: (cm) Depth: (cm) Clustered Quantity: Area: (cm) Volume: (cm) 0 %  Reduction in Area: 100% 0 % Reduction in Volume: 100% 0 Epithelialization: Large (67-100%) 1 Tunneling: No 0 Undermining: No 0 Wound Description Classification: Full Thickness Without Exposed Sup Wound Margin: Distinct, outline attached Exudate Amount: Large Exudate Type: Serous Exudate Color: amber port Structures  Foul Odor After Cleansing: No Slough/Fibrino Yes Wound Bed Granulation Amount: None Present (0%) Exposed Structure Necrotic Amount: None Present (0%) Fascia Exposed: No Fat Layer (Subcutaneous Tissue) Exposed: Yes Tendon Exposed: No Muscle Exposed: No Joint Exposed: No Bone Exposed: No Treatment Notes Wound #2 (Lower Leg) Wound Laterality: Left, Anterior Cleanser Peri-Wound Care Topical Primary Dressing Secondary Dressing Secured With Compression Wrap Compression Stockings Add-Ons Electronic Signature(s) Signed: 10/27/2021 4:25:50 PM By: Rhae Hammock RN Entered By: Rhae Hammock on 10/27/2021 15:45:22 Russell Collier ([id number]) [id number] Page 8 of 8 -------------------------------------------------------------------------------- Vitals Details Patient Name: Date of Service: Russell Collier 10/27/2021 3:15 PM Medical Record Number: [id number] Patient Account Number: [id number] Date of Birth/Sex: Treating RN: 10-09-1960 (61 y.o. M) Primary Care Derrisha Foos: Alen Bleacher Other Clinician: Referring Donte Lenzo: Treating Rennae Ferraiolo/Extender: [facility], Bethany Weeks in Treatment: 10 Vital Signs Time Taken: 14:51 Temperature (F): 98.7 Height (in): 70 Pulse (bpm): 77 Weight (lbs): 315 Respiratory Rate (breaths/min): 18 Body Mass Index (BMI): 45.2 Blood Pressure (mmHg): 119/74 Reference Range: 80 - 120 mg / dl Electronic Signature(s) Signed: 10/28/2021 4:42:40 PM By: Erenest Blank Entered By: Erenest Blank on 10/27/2021 14:51:34

## 2021-10-29 ENCOUNTER — Other Ambulatory Visit (HOSPITAL_COMMUNITY): Payer: Self-pay

## 2021-10-29 DIAGNOSIS — M5136 Other intervertebral disc degeneration, lumbar region: Secondary | ICD-10-CM | POA: Diagnosis not present

## 2021-10-30 ENCOUNTER — Encounter: Payer: Self-pay | Admitting: Neurology

## 2021-10-30 ENCOUNTER — Ambulatory Visit: Payer: Medicaid Other | Admitting: Neurology

## 2021-10-30 VITALS — BP 119/70 | HR 90 | Ht 70.0 in | Wt 312.0 lb

## 2021-10-30 DIAGNOSIS — G63 Polyneuropathy in diseases classified elsewhere: Secondary | ICD-10-CM | POA: Diagnosis not present

## 2021-10-30 DIAGNOSIS — E851 Neuropathic heredofamilial amyloidosis: Secondary | ICD-10-CM

## 2021-10-30 DIAGNOSIS — M5136 Other intervertebral disc degeneration, lumbar region: Secondary | ICD-10-CM | POA: Diagnosis not present
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
## 2021-10-30 NOTE — Patient Instructions (Signed)
Return to clinic in 6 months.

## 2021-11-02 ENCOUNTER — Other Ambulatory Visit: Payer: Self-pay | Admitting: *Deleted

## 2021-11-02 ENCOUNTER — Encounter: Payer: Self-pay | Admitting: Podiatry

## 2021-11-02 ENCOUNTER — Other Ambulatory Visit: Payer: Self-pay | Admitting: Family Medicine

## 2021-11-02 ENCOUNTER — Ambulatory Visit (INDEPENDENT_AMBULATORY_CARE_PROVIDER_SITE_OTHER): Payer: Medicaid Other | Admitting: Podiatry

## 2021-11-02 DIAGNOSIS — M79674 Pain in right toe(s): Secondary | ICD-10-CM | POA: Diagnosis not present

## 2021-11-02 DIAGNOSIS — M79675 Pain in left toe(s): Secondary | ICD-10-CM

## 2021-11-02 DIAGNOSIS — E1151 Type 2 diabetes mellitus with diabetic peripheral angiopathy without gangrene: Secondary | ICD-10-CM | POA: Diagnosis not present

## 2021-11-02 DIAGNOSIS — M5136 Other intervertebral disc degeneration, lumbar region: Secondary | ICD-10-CM | POA: Diagnosis not present

## 2021-11-02 DIAGNOSIS — M21371 Foot drop, right foot: Secondary | ICD-10-CM

## 2021-11-02 DIAGNOSIS — B351 Tinea unguium: Secondary | ICD-10-CM

## 2021-11-02 DIAGNOSIS — D689 Coagulation defect, unspecified: Secondary | ICD-10-CM

## 2021-11-02 NOTE — Patient Instructions (Signed)
Visit Information  Russell Collier was given information about Medicaid Managed Care team care coordination services as a part of their [place] Medicaid benefit. Russell Collier verbally consented to engagement with the [facility] Managed Care team.   If you are experiencing a medical emergency, please call 911 or report to your local emergency department or urgent care.   If you have a non-emergency medical problem during routine business hours, please contact your provider's office and ask to speak with a nurse.   For questions related to your [facility], please call: [phone] or visit the homepage here: [email or web address]  If you would like to schedule transportation through your [facility], please call the following number at least 2 days in advance of your appointment: [phone]   Rides for urgent appointments can also be made after hours by calling Member Services.  Call the [employer] at [phone], at any time, 24 hours a day, 7 days a week. If you are in danger or need immediate medical attention call 911.  If you would like help to quit smoking, call 1-800-QUIT-NOW 754 179 5932) OR Espaol: 1-855-Djelo-Ya (5-697-948-0165) o para ms informacin haga clic aqu or Text READY to 200-400 to register via text  Russell Collier,   Please see education materials related to diet provided as print materials.   The patient verbalized understanding of instructions, educational materials, and care plan provided today and agreed to receive a mailed copy of patient instructions, educational materials, and care plan.   No further follow up required:    Russell Joiner RN, [name]   Following is a copy of your plan of care:  Care Plan : [place] of Care  Updates made by Russell Montane, RN since 11/02/2021 12:00 AM  Completed 11/02/2021   Problem: Health Management Needs Related to Pain Resolved 11/02/2021     Long-Range Goal: Development of Plan of Care to address health management needs related to pain Completed 11/02/2021  Start Date: 02/05/2021  Expected End Date: 10/22/2021  Priority: High  Note:   Current Barriers:  Knowledge Deficits related to plan of care for management of new right foot pain and numbness Russell Collier had angioplasty in July, left leg wound is now healing. He will attend visit with Wound Care today. He has applied for housing, currently living with his cousin. He continues to receive PCS 7 days/week and MOW twice weekly. He denies any needs today.  RNCM Clinical Goal(s):  Patient will verbalize understanding of plan for management of pain as evidenced by patient verbalization of self monitoring activities take all medications exactly as prescribed and will call provider for medication related questions as evidenced by documentation in EMR    attend all scheduled medical appointments: 6/28 for Wound Care, VVS and Plastic Surgeon and 7/5 for Wound Care and 7/6 with Pharmacist as evidenced by provider documentation in EMR        through collaboration with RN Care manager, provider, and care team.  Interventions: Inter-disciplinary care team collaboration (see longitudinal plan of care) Evaluation of current treatment plan related to  self management and patient's adherence to plan as established by provider Discussed diet and ways to improve food choices Advised patient to contact RNCM if new needs arise or discuss new referral to Case Management with PCP.   Pain:  (Status: Goal Met.) Long Term Goal  Pain assessment performed  Medications reviewed-discussed avoiding ibuprofen, advised taking tylenol for pain Discussed importance of adherence to all scheduled medical appointments; Counseled on the importance of reporting any/all new or changed  pain symptoms or management strategies to pain management provider; Advised patient to report to care team affect of pain on daily activities; Reviewed with patient prescribed pharmacological and nonpharmacological pain relief strategies; Provided education on cellulitis  Patient Goals/Self-Care Activities: Take medications as prescribed   Attend all scheduled provider appointments Call pharmacy for medication refills 3-7 days in advance of running out of medications Call provider office for new concerns or questions
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
## 2021-11-02 NOTE — Patient Outreach (Signed)
Medicaid Managed Care   Nurse Care Manager Note  11/02/2021 Name:  Aram Domzalski MRN:  [id number] DOB:  [birth year]  Makaio Mach is an 61 y.o. year old male who is a primary patient of Alen Bleacher, MD.  [facility] Managed Care Coordination team was consulted for assistance with:    pain  Mr. Sweetin was given information about Medicaid Managed Care Coordination team services today. [name] Patient agreed to services and verbal consent obtained.  Engaged with patient by telephone for follow up visit in response to provider referral for case management and/or care coordination services.   Assessments/Interventions:  Review of past medical history, allergies, medications, health status, including review of consultants reports, laboratory and other test data, was performed as part of comprehensive evaluation and provision of chronic care management services.  SDOH (Social Determinants of Health) assessments and interventions performed: SDOH Interventions    Flowsheet Row Patient Outreach Telephone from 08/10/2021 in [place] Patient Outreach Telephone from 07/15/2021 in [place] Patient Outreach Telephone from 05/11/2021 in [place] Patient Outreach Telephone from 04/24/2021 in [place] Patient Outreach Telephone from 03/24/2021 in [place] Patient Outreach Telephone from 02/19/2021 in [place] Interventions        Food Insecurity Interventions -- -- -- -- Intervention Not Indicated --  Transportation Interventions Intervention Not Indicated  [Utilizing [place] transportation] Intervention Not Indicated  [Utilizing U[employer] medical transportation] -- Intervention Not Indicated -- Intervention Not Indicated  Physical Activity Interventions  -- -- Intervention Not Indicated -- -- --       Care Plan  Allergies  Allergen Reactions   Chantix [Varenicline] Anaphylaxis, Swelling and Other (See Comments)    Patient reports laryngospasm stopped in the ER, throat swelling   Bupropion Other (See Comments)    Headache - moderate/severe - self discontinued agent    Bactrim [Sulfamethoxazole-Trimethoprim] Rash    Rash and hives   Nicoderm [Nicotine] Rash    Patch    Medications Reviewed Today     Reviewed by [name], RN (Registered Nurse) on 11/02/21 at [phone]  Med List Status: <None>   Medication Order Taking? Sig Documenting Provider Last Dose Status Informant  Accu-Chek Softclix Lancets lancets [id number] No Use as instructed  Patient not taking: Reported on 10/21/2021   Concepcion Living, MD Not Taking Active Self  acetaminophen (TYLENOL) 325 MG tablet [id number] Yes Take 2 tablets (650 mg total) by mouth every 6 (six) hours as needed for mild pain or moderate pain. Orvis Brill, DO Taking Active Self  albuterol (PROVENTIL) (2.5 MG/3ML) 0.083% nebulizer solution [id number] Yes Take 3 mLs (2.5 mg total) by nebulization every 6 (six) hours as needed for wheezing or shortness of breath. Concepcion Living, MD Taking Active Self  ANORO ELLIPTA 62.5-25 MCG/ACT AEPB [id number] Yes INHALE 1 PUFF INTO THE LUNGS DAILY AS NEEDED.  Patient taking differently: Inhale 1 puff into the lungs daily as needed (for "flares").   Concepcion Living, MD Taking Active Self  atorvastatin (LIPITOR) 80 MG tablet [id number] Yes Take 1 tablet (80 mg total) by mouth daily. Concepcion Living, MD Taking Active Self  Blood Glucose Monitoring Suppl (ACCU-CHEK GUIDE) w/Device KIT [id number] No 1 Units by Does not apply route daily.  Patient not taking: Reported on 11/02/2021   Concepcion Living, MD Not Taking Active Self  Med Note (Johniece Hornbaker A   Mon Aug 10, 2021  2:12 PM) Has not received from pharmacy  clopidogrel (PLAVIX) 75 MG tablet [id number]  Yes Take 1 tablet (75 mg total) by mouth daily. Waynetta Sandy, MD Taking Active   ELIQUIS 2.5 MG TABS tablet [id number] Yes TAKE 1 TABLET (2.5 MG TOTAL) BY MOUTH 2 (TWO) TIMES DAILY. Alen Bleacher, MD Taking Active   enalapril (VASOTEC) 20 MG tablet [id number] Yes TAKE 1 TABLET (20 MG TOTAL) BY MOUTH AT BEDTIME. Alen Bleacher, MD Taking Active   FARXIGA 10 MG TABS tablet [id number] Yes TAKE 1 TABLET (10 MG TOTAL) BY MOUTH DAILY BEFORE BREAKFAST.  Patient taking differently: Take 10 mg by mouth daily.   Concepcion Living, MD Taking Active Self  fluticasone [facility]) 50 MCG/ACT nasal spray [id number] Yes Place 1 spray into both nostrils daily.  Patient taking differently: Place 1 spray into both nostrils daily as needed for allergies.   Concepcion Living, MD Taking Active Self  furosemide (LASIX) 40 MG tablet [id number] Yes Take 1.5 tablets (60 mg total) by mouth 2 (two) times daily.  Patient taking differently: Take 60 mg by mouth 2 (two) times daily. Taking 40 mg bid   Lyda Jester M, [place] Taking Active Self  glucose blood (ACCU-CHEK GUIDE) test strip [id number] No Use as instructed  Patient not taking: Reported on 11/02/2021   Concepcion Living, MD Not Taking Active Self  ibuprofen (ADVIL) 200 MG tablet [id number] Yes Take 600 mg by mouth every 6 (six) hours as needed for headache or moderate pain. [provider] Taking Active Self  isosorbide mononitrate (IMDUR) 30 MG 24 hr tablet [id number] Yes Take 1 tablet (30 mg total) by mouth daily. Alen Bleacher, MD Taking Active   multivitamin (ONE-A-DAY MEN'S) TABS tablet [id number] Yes Take 1 tablet by mouth daily with breakfast. [provider] Taking Active Self  pantoprazole (PROTONIX) 40 MG tablet [id number] Yes Take 1 tablet (40 mg total) by mouth daily. Alen Bleacher, MD Taking Active   potassium chloride SA (KLOR-CON M) 20 MEQ tablet [id number] Yes Take 2 tablets (40 mEq total) by mouth daily. Consuelo Pandy, PA-C  Taking Active Self           Med Note Domingo Sep   Tue Jul 14, 2021  1:44 PM)    pregabalin (LYRICA) 100 MG capsule [id number] Yes Take 1 capsule (100 mg total) by mouth 3 (three) times daily. Alen Bleacher, MD Taking Active   rosuvastatin (CRESTOR) 10 MG tablet [id number] Yes Take 1 tablet (10 mg total) by mouth daily. Waynetta Sandy, MD Taking Active   Tafamidis [facility]) 61 MG CAPS [id number] Yes TAKE 1 CAPSULE BY MOUTH DAILY.  Patient taking differently: Take 61 mg by mouth daily.   [name], [name], MD Taking Active Self  triamcinolone ointment (KENALOG) 0.5 % [id number] Yes Apply 1 Application topically 2 (two) times daily. Concepcion Living, MD Taking Active Self  valACYclovir (VALTREX) 500 MG tablet [id number] Yes Take 1 tablet (500 mg total) by mouth daily. Alen Bleacher, MD Taking Active   VENTOLIN HFA [phone] Base) MCG/ACT inhaler [id number] Yes INHALE 2 PUFFS INTO THE LUNGS EVERY 6 (SIX) HOURS AS NEEDED FOR WHEEZING OR SHORTNESS OF BREATH. Alen Bleacher, MD Taking Active   Vitamin A 2400 MCG (8000 UT) TABS [id number] Yes Take 1 tablet by mouth daily.  Patient taking differently: Take 8,000 Units by mouth daily.   [name], [name], MD Taking Active Self  Med Note Duffy Bruce, Legrand Como   Sun Jun 28, 2021  9:38 PM)    vutrisiran sodium (AMVUTTRA) 25 MG/0.5ML syringe [id number] Yes Inject 0.5 mLs (25 mg total) into the skin every 3 (three) months. [name], [name], MD Taking Active Self            Patient Active Problem List   Diagnosis Date Noted   Unspecified skin changes 08/21/2021   Open wound of left lower extremity 08/21/2021   Pulmonary hypertension, primary ([place])    Cellulitis 07/09/2021   Cellulitis of right lower extremity 07/04/2021   Urinary tract infection without hematuria    Sepsis ([place]) 06/28/2021   Community acquired pneumonia    Gout 02/26/2021   Skin tag 02/12/2021   Skin cyst 02/12/2021   Anticoagulant long-term use 01/29/2021    Dermoid cyst of skin of nose 12/24/2020   Strain of right calf muscle 07/01/2020   Status post lumbar spinal fusion 06/06/2020   Impaired mobility and ADLs 04/23/2020   Midline low back pain 04/23/2020   Neuropathy, 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   COPD exacerbation ([place]) 03/20/2020   Type 2 diabetes mellitus with diabetic peripheral angiopathy without gangrene, without long-term current use of insulin ([place]) 03/20/2020   Coagulation defect ([place]) 12/11/2019   Fatigue 12/10/2019   Leg cramps 12/10/2019   Frequent epistaxis 10/30/2019   Disc degeneration, 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   Ganglion of right knee 09/27/2019   Lumbar radiculopathy 09/27/2019   Lower extremity edema    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[place])    Respiratory failure ([place]) 07/27/2019   Primary osteoarthritis of right knee 07/24/2019   Breast tenderness in male 05/31/2019   Chronic pain 05/31/2019   AKI (acute kidney injury) ([place]) 05/31/2019   Pain due to onychomycosis of toenails of both feet 05/16/2019   Chronic knee pain 04/16/2019   Diabetes ([place]) 04/16/2019   Esophageal reflux 02/06/2019   Heartburn 02/06/2019   Chronic skin ulcer of lower leg ([place]) 12/21/2018   S/P drug eluting coronary stent placement    Coronary artery disease involving native coronary artery of native heart with unstable angina pectoris ([place]) 12/20/2018   Unstable angina (HCC)    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   Acute on chronic diastolic heart failure (HCC)    Shortness of breath    Precordial chest pain    Idiopathic peripheral neuropathy    Palpitations    Chronic diastolic heart failure (HCC)    Abnormal ankle brachial index (ABI)    Chest pain, rule out acute myocardial infarction 09/26/2018   History of cocaine abuse ([place]) 08/20/2018   Marijuana use 08/20/2018   Morbid obesity with BMI of 40.0-44.9, adult ([place]) 08/20/2018   Dyspepsia    Morbid obesity (HCC)    OSA on CPAP    Chest pain 05/30/2018   CAD (coronary artery disease) 03/30/2018    Elevated troponin 02/03/2018   Positive urine drug screen 02/03/2018   Tobacco abuse 02/03/2018   Hyperlipidemia 02/03/2018   Acute respiratory failure with hypoxia ([place]) 02/02/2018   Chronic congestive heart failure (HCC)    Keratoconjunctivitis sicca of left eye not specified as Sjogren's 09/06/2017   Long term current use of oral hypoglycemic drug 09/06/2017   Mechanical ectropion of left lower eyelid 09/06/2017   Nuclear sclerotic cataract of both eyes 09/06/2017   Refractive amblyopia, left 09/06/2017   Type 2 diabetes mellitus without complication, without long-term current use of insulin ([place]) 09/06/2017   Hyperopia of both eyes with astigmatism and presbyopia 09/06/2017   Atypical  chest pain 12/27/2015   Essential hypertension    Neuropathy     Conditions to be addressed/monitored per PCP order:   pain  Care Plan : [place] of Care  Updates made by [name], RN since 11/02/2021 12:00 AM  Completed 11/02/2021   Problem: Health Management Needs Related to Pain Resolved 11/02/2021     Long-Range Goal: Development of Plan of Care to address health management needs related to pain Completed 11/02/2021  Start Date: 02/05/2021  Expected End Date: 10/22/2021  Priority: High  Note:   Current Barriers:  Knowledge Deficits related to plan of care for management of new right foot pain and numbness Mr. Karwowski had angioplasty in July, left leg wound is now healing. He will attend visit with Wound Care today. He has applied for housing, currently living with his cousin. He continues to receive PCS 7 days/week and MOW twice weekly. He denies any needs today.  RNCM Clinical Goal(s):  Patient will verbalize understanding of plan for management of pain as evidenced by patient verbalization of self monitoring activities take all medications exactly as prescribed and will call provider for medication related questions as evidenced by documentation in EMR    attend all scheduled  medical appointments: 6/28 for Wound Care, VVS and Plastic Surgeon and 7/5 for Wound Care and 7/6 with Pharmacist as evidenced by provider documentation in EMR        through collaboration with RN Care manager, provider, and care team.  Interventions: Inter-disciplinary care team collaboration (see longitudinal plan of care) Evaluation of current treatment plan related to  self management and patient's adherence to plan as established by provider Discussed diet and ways to improve food choices Advised patient to contact RNCM if new needs arise or discuss new referral to Case Management with PCP.   Pain:  (Status: Goal Met.) Long Term Goal  Pain assessment performed Medications reviewed-discussed avoiding ibuprofen, advised taking tylenol for pain Discussed importance of adherence to all scheduled medical appointments; Counseled on the importance of reporting any/all new or changed pain symptoms or management strategies to pain management provider; Advised patient to report to care team affect of pain on daily activities; Reviewed with patient prescribed pharmacological and nonpharmacological pain relief strategies; Provided education on cellulitis  Patient Goals/Self-Care Activities: Take medications as prescribed   Attend all scheduled provider appointments Call pharmacy for medication refills 3-7 days in advance of running out of medications Call provider office for new concerns or questions        Follow Up:  Patient agrees to Care Plan and Follow-up.  Plan: Patient will contact RNCM with new needs or discuss referral to Case Management with PCP if needed  Date/time of next scheduled RN care management/care coordination outreach:  no follow up  [name] RN, BSN [place] RN Care Coordinator

## 2021-11-03 DIAGNOSIS — M5136 Other intervertebral disc degeneration, lumbar region: Secondary | ICD-10-CM | POA: Diagnosis not present

## 2021-11-04 ENCOUNTER — Ambulatory Visit: Payer: Medicaid Other | Admitting: Podiatry

## 2021-11-04 ENCOUNTER — Other Ambulatory Visit (HOSPITAL_COMMUNITY): Payer: Self-pay | Admitting: *Deleted

## 2021-11-04 DIAGNOSIS — M5136 Other intervertebral disc degeneration, lumbar region: Secondary | ICD-10-CM | POA: Diagnosis not present

## 2021-11-04 DIAGNOSIS — I5032 Chronic diastolic (congestive) heart failure: Secondary | ICD-10-CM

## 2021-11-04 NOTE — Progress Notes (Signed)
Pt does not meet criteria for cardiac rehab, per Dr Haroldine Laws pt referred to pulm rehab instead

## 2021-11-05 DIAGNOSIS — M5136 Other intervertebral disc degeneration, lumbar region: Secondary | ICD-10-CM | POA: Diagnosis not present

## 2021-11-06 DIAGNOSIS — M5136 Other intervertebral disc degeneration, lumbar region: Secondary | ICD-10-CM | POA: Diagnosis not present

## 2021-11-06 MED FILL — Vutrisiran Sodium Soln Prefilled Syringe 25 MG/0.5ML: SUBCUTANEOUS | Qty: 0.5 | Status: AC

## 2021-11-09 ENCOUNTER — Other Ambulatory Visit: Payer: Medicaid Other

## 2021-11-09 DIAGNOSIS — M21371 Foot drop, right foot: Secondary | ICD-10-CM | POA: Diagnosis not present

## 2021-11-09 DIAGNOSIS — M5136 Other intervertebral disc degeneration, lumbar region: Secondary | ICD-10-CM | POA: Diagnosis not present

## 2021-11-10 DIAGNOSIS — M5136 Other intervertebral disc degeneration, lumbar region: Secondary | ICD-10-CM | POA: Diagnosis not present

## 2021-11-11 DIAGNOSIS — M5136 Other intervertebral disc degeneration, lumbar region: Secondary | ICD-10-CM | POA: Diagnosis not present

## 2021-11-12 DIAGNOSIS — M5136 Other intervertebral disc degeneration, lumbar region: Secondary | ICD-10-CM | POA: Diagnosis not present

## 2021-11-13 DIAGNOSIS — M5136 Other intervertebral disc degeneration, lumbar region: Secondary | ICD-10-CM | POA: Diagnosis not present

## 2021-11-16 DIAGNOSIS — M5136 Other intervertebral disc degeneration, lumbar region: Secondary | ICD-10-CM | POA: Diagnosis not present

## 2021-11-17 ENCOUNTER — Other Ambulatory Visit (HOSPITAL_COMMUNITY): Payer: Self-pay

## 2021-11-17 DIAGNOSIS — M5136 Other intervertebral disc degeneration, lumbar region: Secondary | ICD-10-CM | POA: Diagnosis not present

## 2021-11-18 DIAGNOSIS — M5136 Other intervertebral disc degeneration, lumbar region: Secondary | ICD-10-CM | POA: Diagnosis not present

## 2021-11-19 ENCOUNTER — Encounter (HOSPITAL_COMMUNITY): Payer: Self-pay

## 2021-11-19 ENCOUNTER — Other Ambulatory Visit (HOSPITAL_COMMUNITY): Payer: Self-pay

## 2021-11-19 ENCOUNTER — Other Ambulatory Visit (HOSPITAL_COMMUNITY): Payer: Self-pay | Admitting: Internal Medicine

## 2021-11-19 ENCOUNTER — Ambulatory Visit: Payer: Medicaid Other | Admitting: Podiatry

## 2021-11-19 DIAGNOSIS — M5136 Other intervertebral disc degeneration, lumbar region: Secondary | ICD-10-CM | POA: Diagnosis not present

## 2021-11-19 MED ORDER — VYNDAMAX 61 MG PO CAPS
1.0000 | ORAL_CAPSULE | Freq: Every day | ORAL | 11 refills | Status: DC
  Filled 2021-11-19: qty 30, 30d supply, fill #0
  Filled 2021-12-15: qty 30, 30d supply, fill #1
  Filled 2022-01-11: qty 30, 30d supply, fill #2
  Filled 2022-02-09: qty 30, 30d supply, fill #3
  Filled 2022-03-10: qty 30, 30d supply, fill #4
  Filled 2022-04-07: qty 30, 30d supply, fill #5
  Filled 2022-05-05: qty 30, 30d supply, fill #6
  Filled 2022-06-01: qty 30, 30d supply, fill #7
  Filled 2022-07-02: qty 30, 30d supply, fill #8
  Filled 2022-07-29: qty 30, 30d supply, fill #9
  Filled 2022-08-30: qty 30, 30d supply, fill #10
  Filled 2022-09-28: qty 30, 30d supply, fill #11

## 2021-11-20 DIAGNOSIS — M5136 Other intervertebral disc degeneration, lumbar region: Secondary | ICD-10-CM | POA: Diagnosis not present

## 2021-11-23 ENCOUNTER — Other Ambulatory Visit: Payer: Self-pay | Admitting: Family Medicine

## 2021-11-23 DIAGNOSIS — M5136 Other intervertebral disc degeneration, lumbar region: Secondary | ICD-10-CM | POA: Diagnosis not present

## 2021-11-24 ENCOUNTER — Telehealth (HOSPITAL_COMMUNITY): Payer: Self-pay | Admitting: *Deleted

## 2021-11-24 DIAGNOSIS — M5136 Other intervertebral disc degeneration, lumbar region: Secondary | ICD-10-CM | POA: Diagnosis not present

## 2021-11-24 NOTE — Telephone Encounter (Signed)
Called Russell Collier to confirm his PR orientation appointment. He states that he will be coming. Discussed Covid precautions, proper shoes, directions to the department, mask and our contact number. He voices understanding.

## 2021-11-25 ENCOUNTER — Other Ambulatory Visit (HOSPITAL_COMMUNITY): Payer: Self-pay

## 2021-11-25 ENCOUNTER — Encounter (HOSPITAL_COMMUNITY)
Admission: RE | Admit: 2021-11-25 | Discharge: 2021-11-25 | Disposition: A | Discharge status: Home / Self Care | Payer: Medicaid Other | Source: Ambulatory Visit | Attending: Internal Medicine | Admitting: Internal Medicine

## 2021-11-25 VITALS — BP 120/68 | Ht 69.75 in | Wt 315.0 lb

## 2021-11-25 DIAGNOSIS — I5032 Chronic diastolic (congestive) heart failure: Secondary | ICD-10-CM | POA: Diagnosis not present

## 2021-11-25 DIAGNOSIS — I272 Pulmonary hypertension, unspecified: Secondary | ICD-10-CM | POA: Diagnosis not present

## 2021-11-25 DIAGNOSIS — M5136 Other intervertebral disc degeneration, lumbar region: Secondary | ICD-10-CM | POA: Diagnosis not present

## 2021-11-25 NOTE — Progress Notes (Signed)
Russell Collier 61 y.o. male Pulmonary Rehab Orientation Note This patient who was referred to Pulmonary Rehab by Dr. Haroldine Laws with the diagnosis of Chronic diastolic heart failure and Pulmonary hypertension arrived today in Cardiac and Pulmonary Rehab. He arrived ambulatory with normal gait. He does not carry portable oxygen. Per patient, Russell Collier does not use oxygen . Color good, skin warm and dry. Patient is oriented to time and place. Patient's medical history, psychosocial health, and medications reviewed. Psychosocial assessment reveals patient lives with alone. Russell Collier is currently unemployed, disabled. Patient hobbies include  being a DJ, music, drag racing, fishing and strip clubs . Patient reports his stress level is low. Areas of stress/anxiety include  none . Patient does not exhibit signs of depression. Signs of depression include  none . PHQ2/9 score 0/1. Russell Collier shows good  coping skills with positive outlook on life. Offered emotional support and reassurance. Will continue to monitor and evaluate progress toward psychosocial goal of attending the program without any barriers. Physical assessment reveals heart rate is normal, breath sounds clear, no wheezes, rales, or rhonchi. Grip strength equal, strong. Distal pulses present. Russell Collier reports he  does take medications as prescribed. Patient states he  follows a regular  diet and  that he has been eating out at a lot of buffets.. Pt's weight will be monitored closely. Demonstration and practice of PLB using pulse oximeter. Russell Collier able to return demonstration satisfactorily. Safety and hand hygiene in the exercise area reviewed with patient. Russell Collier voices understanding of the information reviewed. Department expectations discussed with patient and achievable goals were set. The patient shows enthusiasm about attending the program and we look forward to working with Russell Collier. Russell Collier completed a 6 min walk test today and is scheduled to begin exercise on 10/10  at 10:15 am.   [id number] Russell Collier
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
## 2021-11-26 DIAGNOSIS — M5136 Other intervertebral disc degeneration, lumbar region: Secondary | ICD-10-CM | POA: Diagnosis not present

## 2021-11-27 DIAGNOSIS — M5136 Other intervertebral disc degeneration, lumbar region: Secondary | ICD-10-CM | POA: Diagnosis not present

## 2021-11-30 ENCOUNTER — Emergency Department (HOSPITAL_COMMUNITY)
Admission: EM | Admit: 2021-11-30 | Discharge: 2021-12-01 | Disposition: A | Discharge status: Home / Self Care | Payer: Medicaid Other | Attending: Emergency Medicine | Admitting: Emergency Medicine

## 2021-11-30 ENCOUNTER — Encounter (HOSPITAL_COMMUNITY): Payer: Self-pay

## 2021-11-30 ENCOUNTER — Encounter (HOSPITAL_COMMUNITY): Payer: Self-pay | Admitting: *Deleted

## 2021-11-30 ENCOUNTER — Ambulatory Visit (HOSPITAL_COMMUNITY)
Admission: RE | Admit: 2021-11-30 | Discharge: 2021-11-30 | Disposition: A | Discharge status: Home / Self Care | Payer: Medicaid Other | Source: Ambulatory Visit | Attending: Cardiology | Admitting: Cardiology

## 2021-11-30 ENCOUNTER — Telehealth (HOSPITAL_COMMUNITY): Payer: Self-pay | Admitting: Pharmacist

## 2021-11-30 ENCOUNTER — Other Ambulatory Visit: Payer: Self-pay

## 2021-11-30 ENCOUNTER — Emergency Department (HOSPITAL_COMMUNITY): Payer: Medicaid Other

## 2021-11-30 DIAGNOSIS — Z7902 Long term (current) use of antithrombotics/antiplatelets: Secondary | ICD-10-CM | POA: Diagnosis not present

## 2021-11-30 DIAGNOSIS — M5137 Other intervertebral disc degeneration, lumbosacral region: Secondary | ICD-10-CM | POA: Diagnosis not present

## 2021-11-30 DIAGNOSIS — R109 Unspecified abdominal pain: Secondary | ICD-10-CM | POA: Insufficient documentation

## 2021-11-30 DIAGNOSIS — M5136 Other intervertebral disc degeneration, lumbar region: Secondary | ICD-10-CM | POA: Diagnosis not present

## 2021-11-30 DIAGNOSIS — K429 Umbilical hernia without obstruction or gangrene: Secondary | ICD-10-CM | POA: Diagnosis not present

## 2021-11-30 DIAGNOSIS — N200 Calculus of kidney: Secondary | ICD-10-CM | POA: Diagnosis not present

## 2021-11-30 LAB — COMPREHENSIVE METABOLIC PANEL
ALT: 23 U/L (ref 0–44)
AST: 21 U/L (ref 15–41)
Albumin: 3.6 g/dL (ref 3.5–5.0)
Alkaline Phosphatase: 77 U/L (ref 38–126)
Anion gap: 8 (ref 5–15)
BUN: 18 mg/dL (ref 8–23)
CO2: 24 mmol/L (ref 22–32)
Calcium: 8.8 mg/dL — ABNORMAL LOW (ref 8.9–10.3)
Chloride: 107 mmol/L (ref 98–111)
Creatinine, Ser: 1.22 mg/dL (ref 0.61–1.24)
GFR, Estimated: >60 mL/min (ref >60)
Glucose, Bld: 96 mg/dL (ref 70–99)
Potassium: 4.4 mmol/L (ref 3.5–5.1)
Sodium: 139 mmol/L (ref 135–145)
Total Bilirubin: 0.5 mg/dL (ref 0.3–1.2)
Total Protein: 7 g/dL (ref 6.5–8.1)

## 2021-11-30 LAB — CBC
HCT: 41.3 % (ref 39.0–52.0)
Hemoglobin: 13.6 g/dL (ref 13.0–17.0)
MCH: 30.4 pg (ref 26.0–34.0)
MCHC: 32.9 g/dL (ref 30.0–36.0)
MCV: 92.4 fL (ref 80.0–100.0)
Platelets: 224 10*3/uL (ref 150–400)
RBC: 4.47 MIL/uL (ref 4.22–5.81)
RDW: 16 % — ABNORMAL HIGH (ref 11.5–15.5)
WBC: 8.8 10*3/uL (ref 4.0–10.5)
nRBC: 0 % (ref 0.0–0.2)

## 2021-11-30 LAB — URINALYSIS, ROUTINE W REFLEX MICROSCOPIC
Bilirubin Urine: NEGATIVE
Glucose, UA: NEGATIVE mg/dL
Hgb urine dipstick: NEGATIVE
Ketones, ur: NEGATIVE mg/dL
Leukocytes,Ua: NEGATIVE
Nitrite: NEGATIVE
Protein, ur: NEGATIVE mg/dL
Specific Gravity, Urine: 1.02 (ref 1.005–1.030)
pH: 6 (ref 5.0–8.0)

## 2021-11-30 LAB — LIPASE, BLOOD: Lipase: 32 U/L (ref 11–51)

## 2021-11-30 NOTE — Telephone Encounter (Signed)
Patient seen in clinic today for Amvuttra administration. Patient's main complaint is 9/10 pain. States this is a throbbing pain. Says it feels like his "kidneys are hurting". States this pain has been occurring for 4 days. He was constipated for 4 days, didn't use the bathroom until Saturday. Has been trying to drink water, hot tea, coffee and soup. Has been using a stool softener. States he has not had pain like this before.   Given significant pain, will not administer Amvuttra injection today. Escorted patient to ED. Rescheduled Amvuttra injection to next week.   Audry Riles, PharmD, BCPS, BCCP, CPP Heart Failure Clinic Pharmacist [phone]

## 2021-11-30 NOTE — ED Provider Triage Note (Signed)
Emergency Medicine Provider Triage Evaluation Note  Russell Collier , a 61 y.o. male  was evaluated in triage.  Pt complains of L sided flank pain and abdominal pain x 4 days. Last BM this morning which was normal. Can't get comfortable. On eliquis for multiple stents  Review of Systems  Positive: Abd pain, flank pain Negative: Fever, vomiting, diarrhea, dysuria  Physical Exam  BP (!) 146/83 (BP Location: Right Arm)   Pulse 65   Temp 98.2 F (36.8 C) (Oral)   Resp 18   Ht 5' 10.5" (1.791 m)   Wt (!) 142.9 kg   SpO2 96%   BMI 44.56 kg/m  Gen:   Awake, no distress   Resp:  Normal effort  MSK:   Moves extremities without difficulty  Other:    Medical Decision Making  Medically screening exam initiated at 2:57 PM.  Appropriate orders placed.  Russell Collier was informed that the remainder of the evaluation will be completed by another provider, this initial triage assessment does not replace that evaluation, and the importance of remaining in the ED until their evaluation is complete.  Workup initiated including renal stone study   Siddhanth Denk T, PA-C 11/30/21 1458

## 2021-11-30 NOTE — ED Triage Notes (Signed)
C/o flank pain with abd.pain . Denies n/v last bm was this am.

## 2021-12-01 ENCOUNTER — Encounter (HOSPITAL_COMMUNITY)
Admission: RE | Admit: 2021-12-01 | Discharge: 2021-12-01 | Disposition: A | Discharge status: Home / Self Care | Payer: Medicaid Other | Source: Ambulatory Visit | Attending: Internal Medicine | Admitting: Internal Medicine

## 2021-12-01 DIAGNOSIS — M5136 Other intervertebral disc degeneration, lumbar region: Secondary | ICD-10-CM | POA: Diagnosis not present

## 2021-12-01 DIAGNOSIS — I5032 Chronic diastolic (congestive) heart failure: Secondary | ICD-10-CM

## 2021-12-01 DIAGNOSIS — I272 Pulmonary hypertension, unspecified: Secondary | ICD-10-CM

## 2021-12-01 LAB — GLUCOSE, CAPILLARY
Glucose-Capillary: 100 mg/dL — ABNORMAL HIGH (ref 70–99)
Glucose-Capillary: 85 mg/dL (ref 70–99)

## 2021-12-01 MED ORDER — LIDOCAINE 5 % EX PTCH: 1.0000 | MEDICATED_PATCH | CUTANEOUS | 0 refills | Status: AC

## 2021-12-01 MED ORDER — KETOROLAC TROMETHAMINE 60 MG/2ML IM SOLN
30.0000 mg | Freq: Once | INTRAMUSCULAR | Status: AC
  Administered 2021-12-01: 30 mg via INTRAMUSCULAR
  Filled 2021-12-01: qty 2

## 2021-12-01 MED ORDER — LIDOCAINE 5 % EX PTCH
1.0000 | MEDICATED_PATCH | CUTANEOUS | Status: DC
Start: 2021-12-01 — End: 2021-12-01
  Administered 2021-12-01: 1 via TRANSDERMAL
  Filled 2021-12-01: qty 1

## 2021-12-01 NOTE — ED Provider Notes (Signed)
[facility] EMERGENCY DEPARTMENT Provider Note   CSN: [id number] Arrival date & time: 11/30/21  1414     History  Chief Complaint  Patient presents with   Abdominal Pain   Flank Pain    Russell Collier is a 61 y.o. male.  The history is provided by the patient.  Abdominal Pain Pain location:  L flank Pain radiation: to the abdomen. Pain severity:  Moderate Onset quality:  Gradual Duration:  4 days Timing:  Constant Progression:  Unchanged Flank Pain Associated symptoms include abdominal pain.       Home Medications Prior to Admission medications   Medication Sig Start Date End Date Taking? Authorizing Provider  Accu-Chek Softclix Lancets lancets Use as instructed Patient not taking: Reported on 10/21/2021 11/20/20   Concepcion Living, MD  acetaminophen (TYLENOL) 325 MG tablet Take 2 tablets (650 mg total) by mouth every 6 (six) hours as needed for mild pain or moderate pain. 07/13/21   Dameron, Luna Fuse, DO  albuterol (PROVENTIL) (2.5 MG/3ML) 0.083% nebulizer solution Take 3 mLs (2.5 mg total) by nebulization every 6 (six) hours as needed for wheezing or shortness of breath. Patient not taking: Reported on 11/25/2021 11/20/20   Concepcion Living, MD  [facility] ELLIPTA 62.5-25 MCG/ACT AEPB INHALE 1 PUFF INTO THE LUNGS DAILY AS NEEDED. 11/23/21   Alen Bleacher, MD  atorvastatin (LIPITOR) 80 MG tablet Take 1 tablet (80 mg total) by mouth daily. Patient not taking: Reported on 11/25/2021 11/20/20   Concepcion Living, MD  Blood Glucose Monitoring Suppl (ACCU-CHEK GUIDE) w/Device KIT 1 Units by Does not apply route daily. Patient not taking: Reported on 11/02/2021 07/23/21   Concepcion Living, MD  clopidogrel (PLAVIX) 75 MG tablet Take 1 tablet (75 mg total) by mouth daily. 09/07/21 09/07/22  Waynetta Sandy, MD  ELIQUIS 2.5 MG TABS tablet TAKE 1 TABLET (2.5 MG TOTAL) BY MOUTH 2 (TWO) TIMES DAILY. 10/23/21   Alen Bleacher, MD  enalapril (VASOTEC) 20 MG tablet TAKE 1 TABLET  (20 MG TOTAL) BY MOUTH AT BEDTIME. 09/23/21   Alen Bleacher, MD  FARXIGA 10 MG TABS tablet TAKE 1 TABLET (10 MG TOTAL) BY MOUTH DAILY BEFORE BREAKFAST. Patient taking differently: Take 10 mg by mouth daily. 06/16/21   Cresenzo, Angelyn Punt, MD  fluticasone (FLONASE) 50 MCG/ACT nasal spray PLACE 1 SPRAY INTO BOTH NOSTRILS DAILY. 11/02/21   Alen Bleacher, MD  furosemide (LASIX) 40 MG tablet Take 1.5 tablets (60 mg total) by mouth 2 (two) times daily. Patient taking differently: Take 60 mg by mouth 2 (two) times daily. Taking 40 mg bid 05/21/21   Lyda Jester M, PA-C  glucose blood (ACCU-CHEK GUIDE) test strip Use as instructed Patient not taking: Reported on 11/02/2021 11/20/20   Concepcion Living, MD  ibuprofen (ADVIL) 200 MG tablet Take 600 mg by mouth every 6 (six) hours as needed for headache or moderate pain.    [provider]  isosorbide mononitrate (IMDUR) 30 MG 24 hr tablet Take 1 tablet (30 mg total) by mouth daily. 09/04/21   Alen Bleacher, MD  multivitamin (ONE-A-DAY MEN'S) TABS tablet Take 1 tablet by mouth daily with breakfast.    [provider]  pantoprazole (PROTONIX) 40 MG tablet Take 1 tablet (40 mg total) by mouth daily. 09/03/21   Alen Bleacher, MD  potassium chloride SA (KLOR-CON M) 20 MEQ tablet Take 2 tablets (40 mEq total) by mouth daily. 05/21/21   Lyda Jester M, PA-C  pregabalin (LYRICA) 100 MG  capsule Take 1 capsule (100 mg total) by mouth 3 (three) times daily. 09/03/21   Alen Bleacher, MD  rosuvastatin (CRESTOR) 10 MG tablet Take 1 tablet (10 mg total) by mouth daily. 09/07/21 09/07/22  Waynetta Sandy, MD  Tafamidis (VYNDAMAX) 61 MG CAPS TAKE 1 CAPSULE BY MOUTH DAILY. 11/19/21 11/19/22  [name], [name], MD  triamcinolone ointment (KENALOG) 0.5 % Apply 1 Application topically 2 (two) times daily. 08/20/21   Cresenzo, Angelyn Punt, MD  valACYclovir (VALTREX) 500 MG tablet Take 1 tablet (500 mg total) by mouth daily. 09/03/21   Alen Bleacher, MD  VENTOLIN  HFA 108 [phone] Base) MCG/ACT inhaler INHALE 2 PUFFS INTO THE LUNGS EVERY 6 (SIX) HOURS AS NEEDED FOR WHEEZING OR SHORTNESS OF BREATH. 09/14/21   Alen Bleacher, MD  Vitamin A 2400 MCG (8000 UT) TABS Take 1 tablet by mouth daily. Patient taking differently: Take 8,000 Units by mouth daily. 05/21/21   [name], [name], MD  vutrisiran sodium (AMVUTTRA) 25 MG/0.5ML syringe Inject 0.5 mLs (25 mg total) into the skin every 3 (three) months. 05/01/21   [name], [name], MD      Allergies    Chantix [varenicline], Bupropion, Bactrim [sulfamethoxazole-trimethoprim], and Nicoderm [nicotine]    Review of Systems   Review of Systems  Gastrointestinal:  Positive for abdominal pain.  Genitourinary:  Positive for flank pain.    Physical Exam Updated Vital Signs BP 133/66   Pulse 65   Temp 98.2 F (36.8 C) (Oral)   Resp 20   Ht 5' 10.5" (1.791 m)   Wt (!) 142.9 kg   SpO2 94%   BMI 44.56 kg/m  Physical Exam Nursing note reviewed.  Constitutional:      General: He is not in acute distress.    Appearance: He is well-developed. He is not diaphoretic.  HENT:     Head: Normocephalic and atraumatic.     Nose: Nose normal.  Eyes:     Conjunctiva/sclera: Conjunctivae normal.     Pupils: Pupils are equal, round, and reactive to light.  Cardiovascular:     Rate and Rhythm: Normal rate and regular rhythm.  Pulmonary:     Effort: Pulmonary effort is normal.     Breath sounds: Normal breath sounds. No wheezing or rales.  Abdominal:     General: Bowel sounds are normal.     Palpations: Abdomen is soft.     Tenderness: There is no abdominal tenderness. There is no guarding or rebound.  Musculoskeletal:        General: Normal range of motion.     Cervical back: Normal, normal range of motion and neck supple.     Thoracic back: Normal.     Lumbar back: Normal.  Skin:    General: Skin is warm and dry.     Capillary Refill: Capillary refill takes less than 2 seconds.  Neurological:     General: No  focal deficit present.     Mental Status: He is alert and oriented to person, place, and time.     Deep Tendon Reflexes: Reflexes normal.  Psychiatric:        Mood and Affect: Mood normal.        Behavior: Behavior normal.     ED Results / Procedures / Treatments   Labs (all labs ordered are listed, but only abnormal results are displayed) Results for orders placed or performed during the hospital encounter of 11/30/21  Lipase, blood  Result Value Ref Range   Lipase 32  11 - 51 U/L  Comprehensive metabolic panel  Result Value Ref Range   Sodium 139 135 - 145 mmol/L   Potassium 4.4 3.5 - 5.1 mmol/L   Chloride 107 98 - 111 mmol/L   CO2 24 22 - 32 mmol/L   Glucose, Bld 96 70 - 99 mg/dL   BUN 18 8 - 23 mg/dL   Creatinine, Ser 1.22 0.61 - 1.24 mg/dL   Calcium 8.8 (L) 8.9 - 10.3 mg/dL   Total Protein 7.0 6.5 - 8.1 g/dL   Albumin 3.6 3.5 - 5.0 g/dL   AST 21 15 - 41 U/L   ALT 23 0 - 44 U/L   Alkaline Phosphatase 77 38 - 126 U/L   Total Bilirubin 0.5 0.3 - 1.2 mg/dL   GFR, Estimated >60 >60 mL/min   Anion gap 8 5 - 15  CBC  Result Value Ref Range   WBC 8.8 4.0 - 10.5 K/uL   RBC 4.47 4.22 - 5.81 MIL/uL   Hemoglobin 13.6 13.0 - 17.0 g/dL   HCT 41.3 39.0 - 52.0 %   MCV 92.4 80.0 - 100.0 fL   MCH 30.4 26.0 - 34.0 pg   MCHC 32.9 30.0 - 36.0 g/dL   RDW 16.0 (H) 11.5 - 15.5 %   Platelets 224 150 - 400 K/uL   nRBC 0.0 0.0 - 0.2 %  Urinalysis, Routine w reflex microscopic Urine, Clean Catch  Result Value Ref Range   Color, Urine YELLOW YELLOW   APPearance CLEAR CLEAR   Specific Gravity, Urine 1.020 1.005 - 1.030   pH 6.0 5.0 - 8.0   Glucose, UA NEGATIVE NEGATIVE mg/dL   Hgb urine dipstick NEGATIVE NEGATIVE   Bilirubin Urine NEGATIVE NEGATIVE   Ketones, ur NEGATIVE NEGATIVE mg/dL   Protein, ur NEGATIVE NEGATIVE mg/dL   Nitrite NEGATIVE NEGATIVE   Leukocytes,Ua NEGATIVE NEGATIVE   CT Renal Stone Study  Result Date: 11/30/2021 CLINICAL DATA:  Flank pain.  Rule out kidney  stone. EXAM: CT ABDOMEN AND PELVIS WITHOUT CONTRAST TECHNIQUE: Multidetector CT imaging of the abdomen and pelvis was performed following the standard protocol without IV contrast. RADIATION DOSE REDUCTION: This exam was performed according to the departmental dose-optimization program which includes automated exposure control, adjustment of the mA and/or kV according to patient size and/or use of iterative reconstruction technique. COMPARISON:  01/12/2019 FINDINGS: Lower chest: No acute abnormality. Hepatobiliary: No focal liver abnormality is seen. No gallstones, gallbladder wall thickening, or biliary dilatation. Pancreas: Unremarkable. No pancreatic ductal dilatation or surrounding inflammatory changes. Spleen: Normal in size without focal abnormality. Adrenals/Urinary Tract: Normal adrenal glands. Calcification within the inferior pole of the right kidney measures 2-3 mm, image 75/6. This may be vascular in etiology. Similarly, in the upper pole of the left kidney there is a punctate calcification measuring 2 mm, image 68/6. No signs of hydronephrosis or mass bilaterally. No hydroureter or ureteral lithiasis. Decompressed urinary bladder appears normal for degree of distension. Stomach/Bowel: Stomach is normal. The appendix is visualized and appears normal. No bowel wall thickening, inflammation, or distension. Vascular/Lymphatic: Aortic atherosclerosis. No aneurysm. No signs of abdominopelvic adenopathy. Reproductive: Prostate is unremarkable. Other: No free fluid or fluid collections. Small fat containing umbilical hernia. Musculoskeletal: Status post L3 through S1 posterior hardware fixation. Laminectomy has been performed at L4 and L5. Degenerative disc disease is noted at L3-4, L4-5 and L5-S1. Multiple tiny punctate sclerotic densities are noted within the imaged portions of the lumbar spine and bony pelvis. These appears similar to previous exam  and a been present since 12/16/2009. 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 IMPRESSION: 1. No acute findings within the abdomen or pelvis. 2. Bilateral nonobstructing renal calculi. 3. Status post L3 through S1 posterior hardware fixation with laminectomy at L4 and L5. 4. Multiple tiny punctate sclerotic densities are noted within the imaged portions of the lumbar spine and bony pelvis. These appears similar to previous exam and a been present since 12/16/2009. 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 5.  Aortic Atherosclerosis (ICD10-I70.0). Electronically Signed   By: Kerby Moors M.D.   On: 11/30/2021 18:58    Radiology CT Renal Stone Study  Result Date: 11/30/2021 CLINICAL DATA:  Flank pain.  Rule out kidney stone. EXAM: CT ABDOMEN AND PELVIS WITHOUT CONTRAST TECHNIQUE: Multidetector CT imaging of the abdomen and pelvis was performed following the standard protocol without IV contrast. RADIATION DOSE REDUCTION: This exam was performed according to the departmental dose-optimization program which includes automated exposure control, adjustment of the mA and/or kV according to patient size and/or use of iterative reconstruction technique. COMPARISON:  01/12/2019 FINDINGS: Lower chest: No acute abnormality. Hepatobiliary: No focal liver abnormality is seen. No gallstones, gallbladder wall thickening, or biliary dilatation. Pancreas: Unremarkable. No pancreatic ductal dilatation or surrounding inflammatory changes. Spleen: Normal in size without focal abnormality. Adrenals/Urinary Tract: Normal adrenal glands. Calcification within the inferior pole of the right kidney measures 2-3 mm, image 75/6. This may be vascular in etiology. Similarly, in the upper pole of the left kidney there is a punctate calcification measuring 2 mm, image 68/6. No signs of hydronephrosis or mass bilaterally. No hydroureter or ureteral lithiasis. Decompressed urinary bladder appears  normal for degree of distension. Stomach/Bowel: Stomach is normal. The appendix is visualized and appears normal. No bowel wall thickening, inflammation, or distension. Vascular/Lymphatic: Aortic atherosclerosis. No aneurysm. No signs of abdominopelvic adenopathy. Reproductive: Prostate is unremarkable. Other: No free fluid or fluid collections. Small fat containing umbilical hernia. Musculoskeletal: Status post L3 through S1 posterior hardware fixation. Laminectomy has been performed at L4 and L5. Degenerative disc disease is noted at L3-4, L4-5 and L5-S1. Multiple tiny punctate sclerotic densities are noted within the imaged portions of the lumbar spine and bony pelvis. These appears similar to previous exam and a been present since 12/16/2009. 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 IMPRESSION: 1. No acute findings within the abdomen or pelvis. 2. Bilateral nonobstructing renal calculi. 3. Status post L3 through S1 posterior hardware fixation with laminectomy at L4 and L5. 4. Multiple tiny punctate sclerotic densities are noted within the imaged portions of the lumbar spine and bony pelvis. These appears similar to previous exam and a been present since 12/16/2009. 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 5.  Aortic Atherosclerosis (ICD10-I70.0). Electronically Signed   By: Kerby Moors M.D.   On: 11/30/2021 18:58    Procedures Procedures    Medications Ordered in ED Medications  ketorolac (TORADOL) injection 30 mg (has no administration in time range)  lidocaine (LIDODERM) 5 % 1 patch (has no administration in time range)    ED Course/ Medical Decision Making/ A&P                           Medical Decision Making Left abdomen and flank pain x 4 days   Amount and/or Complexity of Data Reviewed Independent Historian:  Details: MSE notes  External Data Reviewed: notes.    Details:  Previous notes reviewed  Labs: ordered.    Details: All labs reviewed:  urine is negative for UTI.  Normal white count 8.8 , normal hemoglobin 13.6  normal platelet count.  Normal sodium 139 and normal potassium 4.4 normal creatinine 1.22.  Normal LFTs.  Normal lipase 32  Radiology: ordered and independent interpretation performed.    Details: No acute findings by me   Risk Prescription drug management. Risk Details: Has a pain contract.  Will not be receiving narcotics as no acute findings.  Patient is resting comfortably in bed asleep upon exam.  No non-verbal cues of pain.  Sleeping soundly.  PO challenged.  Stable for discharge with close follow up.     Final Clinical Impression(s) / ED Diagnoses Final diagnoses:  None  Return for intractable cough, coughing up blood, fevers > 100.4 unrelieved by medication, shortness of breath, intractable vomiting, chest pain, shortness of breath, weakness, numbness, changes in speech, facial asymmetry, abdominal pain, passing out, Inability to tolerate liquids or food, cough, altered mental status or any concerns. No signs of systemic illness or infection. The patient is nontoxic-appearing on exam and vital signs are within normal limits.  I have reviewed the triage vital signs and the nursing notes. Pertinent labs & imaging results that were available during my care of the patient were reviewed by me and considered in my medical decision making (see chart for details). After history, exam, and medical workup I feel the patient has been appropriately medically screened and is safe for discharge home. Pertinent diagnoses were discussed with the patient. Patient was given return precautions.   Rx / DC Orders ED Discharge Orders     None         Joselito Fieldhouse, MD 12/01/21 [phone]

## 2021-12-01 NOTE — ED Provider Notes (Signed)
Patient just arrived at the bed at the 41 minute mark   Russell Buikema, MD 12/01/21 5035

## 2021-12-01 NOTE — Progress Notes (Signed)
Daily Session Note  Patient Details  Name: Russell Collier MRN: [id number] Date of Birth: 09-27-1960 Referring Provider:   April Manson Pulmonary Rehab Walk Test from 11/25/2021 in [place]  Referring Provider [place]       Encounter Date: 12/01/2021  Check In:  Session Check In - 12/01/21 1154       Check-In   Supervising physician immediately available to respond to emergencies [facility] - Physician supervision    Physician(s) Erskine Emery    Location [facility]    Staff Present Elmon Else, MS, ACSM-CEP, Exercise Physiologist;Lisa Ysidro Evert, RN;Randi Olen Cordial BS, ACSM-CEP, Exercise Physiologist;Samantha [place], RD, LDN    Virtual Visit No    Medication changes reported     No    Fall or balance concerns reported    No    Tobacco Cessation No Change    Current number of cigarettes/nicotine per day     10    Warm-up and Cool-down Performed on first and last piece of equipment    Resistance Training Performed Yes    VAD Patient? No    PAD/SET Patient? No      Pain Assessment   Currently in Pain? No/denies    Pain Score 0-No pain    Pain Onset --    Multiple Pain Sites No             Capillary Blood Glucose: Results for orders placed or performed during the hospital encounter of 12/01/21 (from the past 24 hour(s))  Glucose, capillary     Status: Abnormal   Collection Time: 12/01/21 10:21 AM  Result Value Ref Range   Glucose-Capillary 100 (H) 70 - 99 mg/dL  Glucose, capillary     Status: None   Collection Time: 12/01/21 11:25 AM  Result Value Ref Range   Glucose-Capillary 85 70 - 99 mg/dL      Social History   Tobacco Use  Smoking Status Every Day   Packs/day: 0.50   Years: 54.00   Total pack years: 27.00   Types: Cigarettes, Cigars   Passive exposure: Never  Smokeless Tobacco Never  Tobacco Comments   1/2 to 1 pack daily    Goals Met:  Proper associated with RPD/PD & O2 Sat Exercise tolerated well No  report of concerns or symptoms today Strength training completed today  Goals Unmet:  Not Applicable  Comments: Service time is from 1018 to 1137.    Dr. Rodman Pickle is Medical Director for Pulmonary Rehab at [facility]

## 2021-12-01 NOTE — ED Notes (Signed)
Pt states pain has improved enough for him to be able to fall asleep. Pt provided water and saltine crackers for P.O. challenge

## 2021-12-02 DIAGNOSIS — M5136 Other intervertebral disc degeneration, lumbar region: Secondary | ICD-10-CM | POA: Diagnosis not present

## 2021-12-03 ENCOUNTER — Encounter (HOSPITAL_COMMUNITY)
Admission: RE | Admit: 2021-12-03 | Discharge: 2021-12-03 | Disposition: A | Discharge status: Home / Self Care | Payer: Medicaid Other | Source: Ambulatory Visit | Attending: Internal Medicine | Admitting: Internal Medicine

## 2021-12-03 DIAGNOSIS — M5136 Other intervertebral disc degeneration, lumbar region: Secondary | ICD-10-CM | POA: Diagnosis not present

## 2021-12-03 DIAGNOSIS — I5032 Chronic diastolic (congestive) heart failure: Secondary | ICD-10-CM | POA: Diagnosis not present

## 2021-12-03 LAB — GLUCOSE, CAPILLARY
Glucose-Capillary: 112 mg/dL — ABNORMAL HIGH (ref 70–99)
Glucose-Capillary: 119 mg/dL — ABNORMAL HIGH (ref 70–99)

## 2021-12-03 NOTE — Progress Notes (Signed)
Discussed smoking cessation with pt. He sts he would like to quit smoking eventually and is working on gradually cutting down to quit. He is currently smoking 10 small cigars daily. He also smokes marijuana occasionally. Discussed the risks of any smoking. Gave pt written material on quitting and encouraged him to make a concrete plan to cut down to 0 cigars. Also discussed other ways of stress relief with pt. He voices understanding. Encouraged him to think about cessation classes through [facility] or using an app for cessation. Will f/u with pt during program. Yves Dill BS, ACSM-CEP 12/03/2021 12:32 PM

## 2021-12-03 NOTE — Progress Notes (Signed)
Daily Session Note  Patient Details  Name: Dmani Mizer MRN: [id number] Date of Birth: 06-Jun-1960 Referring Provider:   April Manson Pulmonary Rehab Walk Test from 11/25/2021 in [place]  Referring Provider [place]       Encounter Date: 12/03/2021  Check In:  Session Check In - 12/03/21 1130       Check-In   Supervising physician immediately available to respond to emergencies [facility] - Physician supervision    Physician(s) Erskine Emery    Location [facility]    Staff Present Elmon Else, MS, ACSM-CEP, Exercise Physiologist;Lisa Ysidro Evert, RN;Kelsy Polack Olen Cordial BS, ACSM-CEP, Exercise Physiologist;Samantha [place], RD, LDN    Virtual Visit No    Medication changes reported     No    Fall or balance concerns reported    No    Tobacco Cessation No Change    Warm-up and Cool-down Performed as group-led instruction    Resistance Training Performed Yes    VAD Patient? No    PAD/SET Patient? No      Pain Assessment   Currently in Pain? No/denies             Capillary Blood Glucose: Results for orders placed or performed during the hospital encounter of 12/03/21 (from the past 24 hour(s))  Glucose, capillary     Status: Abnormal   Collection Time: 12/03/21 10:27 AM  Result Value Ref Range   Glucose-Capillary 112 (H) 70 - 99 mg/dL  Glucose, capillary     Status: Abnormal   Collection Time: 12/03/21 11:54 AM  Result Value Ref Range   Glucose-Capillary 119 (H) 70 - 99 mg/dL      Social History   Tobacco Use  Smoking Status Every Day   Packs/day: 0.50   Years: 54.00   Total pack years: 27.00   Types: Cigarettes, Cigars   Passive exposure: Never  Smokeless Tobacco Never  Tobacco Comments   1/2 to 1 pack daily    Goals Met:  Independence with exercise equipment Exercise tolerated well No report of concerns or symptoms today Strength training completed today  Goals Unmet:  Not Applicable  Comments: Pt BP low,  increased with fluid intake. Discussed smoking cessation. C/o leg cramps. Service time is from 1017 to 1150.    Dr. Rodman Pickle is Medical Director for Pulmonary Rehab at [facility].

## 2021-12-04 ENCOUNTER — Ambulatory Visit (INDEPENDENT_AMBULATORY_CARE_PROVIDER_SITE_OTHER): Payer: Medicaid Other | Admitting: Podiatrist

## 2021-12-04 DIAGNOSIS — L600 Ingrowing nail: Secondary | ICD-10-CM | POA: Diagnosis not present

## 2021-12-04 DIAGNOSIS — M5136 Other intervertebral disc degeneration, lumbar region: Secondary | ICD-10-CM | POA: Diagnosis not present

## 2021-12-04 NOTE — Progress Notes (Signed)
Chief Complaint  Patient presents with   Nail Problem     Left great toe nail possible ingrown     HPI: Patient is 61 y.o. male who presents today for pain at the left great toenail- due to a possible ingrown toenail.  He relates it throbs at times. He is a diabetic and on Blood thinners.   He is also waiting on a dropfoot brace from our office that Aaron Edelman started working on before he left our practice.  Deloris states that he tried a brace made for him but it rubbed and Aaron Edelman sent it back to be fixed-  he has yet to be contacted about the brace.    Current Outpatient Medications on File Prior to Visit  Medication Sig Dispense Refill   Accu-Chek Softclix Lancets lancets Use as instructed (Patient not taking: Reported on 10/21/2021) 100 each 12   acetaminophen (TYLENOL) 325 MG tablet Take 2 tablets (650 mg total) by mouth every 6 (six) hours as needed for mild pain or moderate pain. 30 tablet 0   albuterol (PROVENTIL) (2.5 MG/3ML) 0.083% nebulizer solution Take 3 mLs (2.5 mg total) by nebulization every 6 (six) hours as needed for wheezing or shortness of breath. (Patient not taking: Reported on 11/25/2021) 75 mL 3   ANORO ELLIPTA 62.5-25 MCG/ACT AEPB INHALE 1 PUFF INTO THE LUNGS DAILY AS NEEDED. 60 each 6   atorvastatin (LIPITOR) 80 MG tablet Take 1 tablet (80 mg total) by mouth daily. (Patient not taking: Reported on 11/25/2021) 90 tablet 3   Blood Glucose Monitoring Suppl (ACCU-CHEK GUIDE) w/Device KIT 1 Units by Does not apply route daily. (Patient not taking: Reported on 11/02/2021) 1 kit 0   clopidogrel (PLAVIX) 75 MG tablet Take 1 tablet (75 mg total) by mouth daily. 30 tablet 11   ELIQUIS 2.5 MG TABS tablet TAKE 1 TABLET (2.5 MG TOTAL) BY MOUTH 2 (TWO) TIMES DAILY. 60 tablet 11   enalapril (VASOTEC) 20 MG tablet TAKE 1 TABLET (20 MG TOTAL) BY MOUTH AT BEDTIME. 90 tablet 3   FARXIGA 10 MG TABS tablet TAKE 1 TABLET (10 MG TOTAL) BY MOUTH DAILY BEFORE BREAKFAST. (Patient taking differently: Take  10 mg by mouth daily.) 30 tablet 6   fluticasone (FLONASE) 50 MCG/ACT nasal spray PLACE 1 SPRAY INTO BOTH NOSTRILS DAILY. 16 g 11   furosemide (LASIX) 40 MG tablet Take 1.5 tablets (60 mg total) by mouth 2 (two) times daily. (Patient taking differently: Take 60 mg by mouth 2 (two) times daily. Taking 40 mg bid) 180 tablet 3   glucose blood (ACCU-CHEK GUIDE) test strip Use as instructed (Patient not taking: Reported on 11/02/2021) 100 each 12   ibuprofen (ADVIL) 200 MG tablet Take 600 mg by mouth every 6 (six) hours as needed for headache or moderate pain.     isosorbide mononitrate (IMDUR) 30 MG 24 hr tablet Take 1 tablet (30 mg total) by mouth daily. 90 tablet 3   lidocaine (LIDODERM) 5 % Place 1 patch onto the skin daily. Remove & Discard patch within 12 hours or as directed by MD 30 patch 0   multivitamin (ONE-A-DAY MEN'S) TABS tablet Take 1 tablet by mouth daily with breakfast.     potassium chloride SA (KLOR-CON M) 20 MEQ tablet Take 2 tablets (40 mEq total) by mouth daily. 60 tablet 6   pregabalin (LYRICA) 100 MG capsule Take 1 capsule (100 mg total) by mouth 3 (three) times daily. 270 capsule 0   rosuvastatin (CRESTOR) 10 MG tablet  Take 1 tablet (10 mg total) by mouth daily. 30 tablet 11   Tafamidis (VYNDAMAX) 61 MG CAPS TAKE 1 CAPSULE BY MOUTH DAILY. 30 capsule 11   triamcinolone ointment (KENALOG) 0.5 % Apply 1 Application topically 2 (two) times daily. 30 g 0   valACYclovir (VALTREX) 500 MG tablet Take 1 tablet (500 mg total) by mouth daily. 90 tablet 3   VENTOLIN HFA 108 (90 Base) MCG/ACT inhaler INHALE 2 PUFFS INTO THE LUNGS EVERY 6 (SIX) HOURS AS NEEDED FOR WHEEZING OR SHORTNESS OF BREATH. 18 g 3   Vitamin A 2400 MCG (8000 UT) TABS Take 1 tablet by mouth daily. (Patient taking differently: Take 8,000 Units by mouth daily.)     vutrisiran sodium (AMVUTTRA) 25 MG/0.5ML syringe Inject 0.5 mLs (25 mg total) into the skin every 3 (three) months. 0.5 mL 0   No current facility-administered  medications on file prior to visit.     Allergies  Allergen Reactions   Chantix [Varenicline] Anaphylaxis, Swelling and Other (See Comments)    Patient reports laryngospasm stopped in the ER, throat swelling   Bupropion Other (See Comments)    Headache - moderate/severe - self discontinued agent    Bactrim [Sulfamethoxazole-Trimethoprim] Rash    Rash and hives   Nicoderm [Nicotine] Rash    Patch    Review of systems is negative except as noted in the HPI.  Denies nausea/ vomiting/ fevers/ chills or night sweats.   Denies difficulty breathing, denies calf pain or tenderness  Physical Exam  Left foot great toenail is thick and painful with palpation.  No redness, no swelling, no drainage is noted.  Mild incurvation on the corners is noted. Left first.   warm, good capillary refill, no trophic changes or ulcerative lesions, normal DP and PT pulses, and normal sensory exam.  Decreased protective sensation noted as well. no signs of infection noted.   Right Foot: POP right 1st MPJ. Slightly decreased PF strength at ankle. 2/5 DF strength. 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 Ingrown left greater toenail  L60.0        Plan: Discussed treatment recommendations and options.  The nail itself looks thickened and would benefit from debridement and slant back procedure was performed today without anesthesia and without complication.  Also wrote a rx for him to go to [facility] to start over with a dropfoot brace.  Likely he will be better off starting over vs trying to figure out where the brace is with our office.  He will return for his scheduled appointment for a nail trim. If the pain fails to resolve he will call and we can do a more aggressive procedure for the nail.

## 2021-12-07 DIAGNOSIS — M5136 Other intervertebral disc degeneration, lumbar region: Secondary | ICD-10-CM | POA: Diagnosis not present

## 2021-12-08 ENCOUNTER — Encounter (HOSPITAL_COMMUNITY)
Admission: RE | Admit: 2021-12-08 | Discharge: 2021-12-08 | Disposition: A | Discharge status: Home / Self Care | Payer: Medicaid Other | Source: Ambulatory Visit | Attending: Internal Medicine | Admitting: Internal Medicine

## 2021-12-08 ENCOUNTER — Other Ambulatory Visit: Payer: Self-pay | Admitting: Student

## 2021-12-08 ENCOUNTER — Ambulatory Visit (HOSPITAL_COMMUNITY)
Admission: RE | Admit: 2021-12-08 | Discharge: 2021-12-08 | Disposition: A | Discharge status: Home / Self Care | Payer: Medicaid Other | Source: Ambulatory Visit | Attending: Cardiology | Admitting: Cardiology

## 2021-12-08 VITALS — Wt 315.0 lb

## 2021-12-08 DIAGNOSIS — G63 Polyneuropathy in diseases classified elsewhere: Secondary | ICD-10-CM | POA: Diagnosis not present

## 2021-12-08 DIAGNOSIS — I5032 Chronic diastolic (congestive) heart failure: Secondary | ICD-10-CM

## 2021-12-08 DIAGNOSIS — I110 Hypertensive heart disease with heart failure: Secondary | ICD-10-CM | POA: Insufficient documentation

## 2021-12-08 DIAGNOSIS — G4733 Obstructive sleep apnea (adult) (pediatric): Secondary | ICD-10-CM | POA: Insufficient documentation

## 2021-12-08 DIAGNOSIS — E851 Neuropathic heredofamilial amyloidosis: Secondary | ICD-10-CM

## 2021-12-08 DIAGNOSIS — Z720 Tobacco use: Secondary | ICD-10-CM | POA: Diagnosis not present

## 2021-12-08 DIAGNOSIS — M5136 Other intervertebral disc degeneration, lumbar region: Secondary | ICD-10-CM | POA: Diagnosis not present

## 2021-12-08 DIAGNOSIS — I43 Cardiomyopathy in diseases classified elsewhere: Secondary | ICD-10-CM | POA: Insufficient documentation

## 2021-12-08 DIAGNOSIS — I5030 Unspecified diastolic (congestive) heart failure: Secondary | ICD-10-CM | POA: Diagnosis not present

## 2021-12-08 DIAGNOSIS — E854 Organ-limited amyloidosis: Secondary | ICD-10-CM | POA: Insufficient documentation

## 2021-12-08 MED ORDER — VUTRISIRAN SODIUM 25 MG/0.5ML ~~LOC~~ SOSY
25.0000 mg | PREFILLED_SYRINGE | Freq: Once | SUBCUTANEOUS | Status: AC
  Administered 2021-12-08: 25 mg via SUBCUTANEOUS
  Filled 2021-12-08: qty 0.5

## 2021-12-08 NOTE — Progress Notes (Signed)
Daily Session Note  Patient Details  Name: Russell Collier MRN: [id number] Date of Birth: [birth year] Referring Provider:   April Manson Pulmonary Rehab Walk Test from 11/25/2021 in [name]  Referring Provider [place]       Encounter Date: 12/08/2021  Check In:  Session Check In - 12/08/21 1120       Check-In   Supervising physician immediately available to respond to emergencies [facility] - Physician supervision    Physician(s) Chand    Location [facility]    Staff Present Elmon Else, MS, ACSM-CEP, Exercise Physiologist;Lisa Ysidro Evert, RN;Marcelino Campos Olen Cordial BS, ACSM-CEP, Exercise Physiologist;Samantha [place], RD, LDN    Virtual Visit No    Medication changes reported     No    Fall or balance concerns reported    No    Tobacco Cessation No Change    Warm-up and Cool-down Performed as group-led instruction    Resistance Training Performed Yes    VAD Patient? No    PAD/SET Patient? No      Pain Assessment   Currently in Pain? No/denies             Capillary Blood Glucose: No results found for this or any previous visit (from the past 24 hour(s)).   Exercise Prescription Changes - 12/08/21 1200       Response to Exercise   Blood Pressure (Admit) 112/80    Blood Pressure (Exercise) 100/56    Blood Pressure (Exit) 98/50    Heart Rate (Admit) 82 bpm    Heart Rate (Exercise) 96 bpm    Heart Rate (Exit) 85 bpm    Oxygen Saturation (Admit) 95 %    Oxygen Saturation (Exercise) 96 %    Oxygen Saturation (Exit) 96 %    Rating of Perceived Exertion (Exercise) 9    Perceived Dyspnea (Exercise) 0    Symptoms 0    Duration Progress to 30 minutes of  aerobic without signs/symptoms of physical distress    Intensity THRR unchanged      Resistance Training   Training Prescription Yes    Weight blue bands    Reps 10-15    Time 10 Minutes      T5 Nustep   Level 4    SPM 80    Minutes 24    METs 2.4             Social  History   Tobacco Use  Smoking Status Every Day   Packs/day: 0.50   Years: 54.00   Total pack years: 27.00   Types: Cigarettes, Cigars   Passive exposure: Never  Smokeless Tobacco Never  Tobacco Comments   1/2 to 1 pack daily    Goals Met:  Exercise tolerated well No report of concerns or symptoms today Strength training completed today  Goals Unmet:  Not Applicable  Comments: Service time is from 1018 to 1137.    Dr. Rodman Pickle is Medical Director for Pulmonary Rehab at [facility].

## 2021-12-08 NOTE — Progress Notes (Signed)
    Advanced Heart Failure Clinic Note   PCP: [name], MD PCP-Cardiologist: Fransico Him, MD  Neurology: Dr Posey Pronto HF Cardiology: Dr. Haroldine Laws  HPI:  Russell Collier is a 61 y.o. 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    Echo 01/2018, EF 55-60%, nonobstructive cardiac cath 01/2018 (20% prox LAD, 60% mid Cx, 50% post atrio lesion) h/o bradycardia w/ ? 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    PYP scan 09/2018 and was an equivocal study. No visual increase in PYP uptake, but ratio was between 1-1.5. Based clinical suspicion, he was ordered to undergo genetic testing and results + for TTR gene.   He was seen by neurology, neuropathy associated with TTR.    Admitted 07/27/19 with atypical chest pain and wheezing.  Troponins were low and flat, EKG reassuring.   A CTA was performed, which showed a possible segmental PE.  Since he does have a history of PE, he was continued on IV heparin and transitioned to Eliquis on 07/28/19. Echo 07/27/19 EF 55% RV normal.    At his HF follow up 12/2019 Eliquis was decreased due to frequent nose bleeds, mobility limited by back and knee pain.   S/p L3-S1 decompression 03/2020, uneventful hospitalization at [facility].   Graduated from paramedicine 09/2020.   Follow up with Neurology 03/2021, recommended starting Amvuttra and recommending restarting PT.  Echo 04/2021 EF 50-55% RV ok   Today he returns to HF clinic for injection of Amvuttra. Overall feeling well today. Feels that his neuropathy has improved somewhat since starting the Amvuttra. No contraindications to injection.    Assessment/Plan: 1. Familial TTR Cardiac Amyloidosis:  - PYP scan 09/2018 was  equivocal. + genetic testing. Has TTR gene, heterozygous.  - He is on tafamadis and referred for family genetic testing. No family members with TTR.  - Based on PYP scan not thought to have extensive cardiac involvement at this point.  - He has numerous neuropathic symptoms. Followed by Dr. Narda Amber in Neurology for neuropathy - Continue Tafamidis. 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 Patient tolerated injection well. Provided patient counseling on Amvuttra. Most common side effects are injection site reactions, arthralgias, dyspnea and vitamin A deficiency. Patient is aware to return to clinic every 3 months for repeat injection.   - Continue vitamin A supplement 8000 IU daily. Amvuttra decreases serum vitamin A levels.  Follow up 3 months for repeat Amvuttra injection.    Audry Riles, PharmD, BCPS, BCCP, CPP Heart Failure Clinic Pharmacist [phone]

## 2021-12-09 ENCOUNTER — Telehealth: Payer: Self-pay | Admitting: Pharmacist

## 2021-12-09 DIAGNOSIS — M5136 Other intervertebral disc degeneration, lumbar region: Secondary | ICD-10-CM | POA: Diagnosis not present

## 2021-12-09 NOTE — Telephone Encounter (Signed)
Noted and agree. 

## 2021-12-09 NOTE — Telephone Encounter (Signed)
-----   Message from Leavy Cella, [place] sent at 10/07/2021 11:09 AM EDT ----- Regarding: tobacco intake reduction from 10-11

## 2021-12-09 NOTE — Telephone Encounter (Signed)
Patient contacted for follow/up of tobacco intake reduction. Since last contact patient reports continues to smoke about 12-13 cigarettes per day.   Medications currently being used; None  Continues to rates IMPORTANCE of quitting tobacco as high.  Continues to rate CONFIDENCE of quitting tobacco as moderate  Most common triggers to use tobacco include; "simply a habit"   Motivation to quit: breathing/health Patient is participating in a Managed Medicaid Plan:  Yes  Total time with patient call and documentation of interaction: 12 minutes. Follow-up phone call planned: 3 months - first of the year.

## 2021-12-10 ENCOUNTER — Encounter (HOSPITAL_COMMUNITY): Payer: Medicaid Other

## 2021-12-10 ENCOUNTER — Other Ambulatory Visit (HOSPITAL_COMMUNITY): Payer: Self-pay

## 2021-12-10 DIAGNOSIS — M5136 Other intervertebral disc degeneration, lumbar region: Secondary | ICD-10-CM | POA: Diagnosis not present

## 2021-12-11 DIAGNOSIS — M5136 Other intervertebral disc degeneration, lumbar region: Secondary | ICD-10-CM | POA: Diagnosis not present

## 2021-12-14 DIAGNOSIS — M5136 Other intervertebral disc degeneration, lumbar region: Secondary | ICD-10-CM | POA: Diagnosis not present

## 2021-12-15 ENCOUNTER — Encounter (HOSPITAL_COMMUNITY)
Admission: RE | Admit: 2021-12-15 | Discharge: 2021-12-15 | Disposition: A | Discharge status: Home / Self Care | Payer: Medicaid Other | Source: Ambulatory Visit | Attending: Internal Medicine | Admitting: Internal Medicine

## 2021-12-15 ENCOUNTER — Other Ambulatory Visit (HOSPITAL_COMMUNITY): Payer: Self-pay

## 2021-12-15 ENCOUNTER — Encounter: Payer: Self-pay | Admitting: Podiatrist

## 2021-12-15 DIAGNOSIS — I5032 Chronic diastolic (congestive) heart failure: Secondary | ICD-10-CM | POA: Diagnosis not present

## 2021-12-15 DIAGNOSIS — M5136 Other intervertebral disc degeneration, lumbar region: Secondary | ICD-10-CM | POA: Diagnosis not present

## 2021-12-15 LAB — GLUCOSE, CAPILLARY: Glucose-Capillary: 131 mg/dL — ABNORMAL HIGH (ref 70–99)

## 2021-12-15 NOTE — Progress Notes (Signed)
Daily Session Note  Patient Details  Name: Russell Collier MRN: [id number] Date of Birth: 1960-06-12 Referring Provider:   April Manson Pulmonary Rehab Walk Test from 11/25/2021 in [place]  Referring Provider [place]       Encounter Date: 12/15/2021  Check In:  Session Check In - 12/15/21 1131       Check-In   Supervising physician immediately available to respond to emergencies [facility] - Physician supervision    Physician(s) Ander Slade    Location [facility]    Staff Present Elmon Else, MS, ACSM-CEP, Exercise Physiologist;David Lilyan Punt, MS, ACSM-CEP, CCRP, Exercise Physiologist;Samantha [place], RD, LDN;Cydnie Deason Ysidro Evert, RN;[employer], ACSM-CEP, Exercise Physiologist    Virtual Visit No    Medication changes reported     No    Fall or balance concerns reported    No    Tobacco Cessation No Change    Warm-up and Cool-down Performed as group-led Higher education careers adviser Performed Yes    VAD Patient? No    PAD/SET Patient? No      Pain Assessment   Currently in Pain? No/denies             Capillary Blood Glucose: Results for orders placed or performed during the hospital encounter of 12/15/21 (from the past 24 hour(s))  Glucose, capillary     Status: Abnormal   Collection Time: 12/15/21 10:14 AM  Result Value Ref Range   Glucose-Capillary 131 (H) 70 - 99 mg/dL      Social History   Tobacco Use  Smoking Status Every Day   Packs/day: 0.50   Years: 54.00   Total pack years: 27.00   Types: Cigarettes, Cigars   Passive exposure: Never  Smokeless Tobacco Never  Tobacco Comments   1/2 to 1 pack daily    Goals Met:  Proper associated with RPD/PD & O2 Sat Exercise tolerated well No report of concerns or symptoms today Strength training completed today  Goals Unmet:  Not Applicable  Comments: Service time is from 1020 to [place]   Dr. Rodman Pickle is Medical Director for Pulmonary Rehab at [facility].

## 2021-12-16 DIAGNOSIS — M5136 Other intervertebral disc degeneration, lumbar region: Secondary | ICD-10-CM | POA: Diagnosis not present

## 2021-12-16 NOTE — Progress Notes (Signed)
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 discussed strategies for weight loss including reducing fried foods, reducing/eliminating sugar drinks, reducing portions and increasing non-starchy vegetables. He was open to reducing portion sizes of entree (i.e. decreasing number of whoppers). He  is down 3.7# since starting with our program.               Nutrition Goals Discharge (Final Nutrition Goals Re-Evaluation):  Nutrition Goals Re-Evaluation - 12/15/21 1125       Goals   Current Weight 311 lb 4.6 oz (141.2 kg)    Comment A1c 6.5; he does report taking MVI daily    Expected Outcome Jenkins remains pre-contemplative toward dietary changes. He reports motivation to lose weight. However, he reports eating out twice daily  everyday Brendolyn Patty, [place], etc) and skipping breakfast. We discussed strategies for weight loss including reducing fried foods, reducing/eliminating sugar drinks, reducing portions and increasing non-starchy vegetables. He was open to reducing portion sizes of entree (i.e. decreasing number of whoppers). He is down 3.7# since starting with our program.             Psychosocial: Target Goals: Acknowledge presence or absence of significant depression and/or stress, maximize coping skills, provide positive support system. Participant is able to verbalize types and ability to use techniques and skills needed for reducing stress and depression.  Initial Review & Psychosocial Screening:  Initial Psych Review & Screening - 11/25/21 1224       Initial Review   Current issues with None Identified      Family Dynamics   Good Support System? Yes   sister, home health aide and 2 daughters     Barriers   Psychosocial barriers to participate in program There are no identifiable barriers or psychosocial needs.      Screening Interventions   Interventions Encouraged to exercise    Expected Outcomes Short Term goal: Utilizing psychosocial counselor, staff and physician to assist with identification of specific Stressors or current issues interfering with healing process. Setting desired goal for each stressor or current issue identified.;Long Term Goal: Stressors or current issues are controlled or eliminated.             Quality of Life Scores:  Scores of 19 and below usually indicate a poorer quality of life in these areas.  A difference of  2-3 points is a clinically meaningful difference.  A difference of 2-3 points in the total score of the Quality of Life Index has been associated with significant improvement in overall quality of life, self-image, physical symptoms, and general health in studies assessing change in quality of life.  PHQ-9: Review Flowsheet       11/25/2021  02/12/2021 12/23/2020 03/25/2020  Depression screen PHQ 2/9  Decreased Interest 0 0 0 0  Down, Depressed, Hopeless 0 0 0 0  PHQ - 2 Score 0 0 0 0  Altered sleeping 0 0 0 0  Tired, decreased energy 1 0 0 0  Change in appetite 3 0 0 0  Feeling bad or failure about yourself  0 0 0 0  Trouble concentrating 0 0 0 0  Moving slowly or fidgety/restless 0 0 0 0  Suicidal thoughts 0 0 0 0  PHQ-9 Score 4 0 0 0  Difficult doing work/chores Not difficult at all - Not difficult at all -   Interpretation of Total Score  Total Score Depression Severity:  1-4 = Minimal depression, 5-9 = Mild depression, 10-14 = Moderate depression, 15-19 = Moderately severe depression, 20-27 = Severe depression   Psychosocial Evaluation and Intervention:  Psychosocial Evaluation - 11/25/21 1220       Psychosocial Evaluation & Interventions   Interventions Encouraged to exercise with the program and follow exercise  prescription    Expected Outcomes That Alieu will be able to attend the PR program without any psychosocial barriers.    Continue Psychosocial Services  No Follow up required             Psychosocial Re-Evaluation:  Psychosocial Re-Evaluation     Cochise Name 12/11/21 0725             Psychosocial Re-Evaluation   Current issues with None Identified       Comments Sinclair has been participating in rehab free of psychosocial concerns.       Expected Outcomes For Cyris to participate in reahb free of psychosocial concerns.       Interventions Encouraged to attend Pulmonary Rehabilitation for the exercise       Continue Psychosocial Services  No Follow up required                Psychosocial Discharge (Final Psychosocial Re-Evaluation):  Psychosocial Re-Evaluation - 12/11/21 0725       Psychosocial Re-Evaluation   Current issues with None Identified    Comments Pressley has been participating in rehab free of psychosocial concerns.    Expected Outcomes For Abdulla to participate in reahb  free of psychosocial concerns.    Interventions Encouraged to attend Pulmonary Rehabilitation for the exercise    Continue Psychosocial Services  No Follow up required             Education: Education Goals: Education classes will be provided on a weekly basis, covering required topics. Participant will state understanding/return demonstration of topics presented.  Learning Barriers/Preferences:  Learning Barriers/Preferences - 11/25/21 1503       Learning Barriers/Preferences   Learning Barriers Exercise Concerns   Due to chronic back pain   Learning Preferences None             Education Topics: Introduction to Pulmonary Rehab Group instruction provided by PowerPoint, verbal discussion, and written material to support subject matter. Instructor reviews what Pulmonary Rehab is, the purpose of the program, and how patients are referred.     Know Your Numbers Group instruction that is supported by a PowerPoint presentation. Instructor discusses importance of knowing and understanding resting, exercise, and post-exercise oxygen saturation, heart rate, and blood pressure. Oxygen saturation, heart rate, blood pressure, rating of perceived exertion, and dyspnea are reviewed along with a normal range for these values.    Exercise for the Pulmonary Patient Group instruction that is supported by a PowerPoint presentation. Instructor discusses benefits of exercise, core components of exercise, frequency, duration, and intensity of an exercise routine, importance of utilizing pulse oximetry during exercise, safety while exercising, and options of places to exercise outside of rehab.       MET Level  Group instruction provided by PowerPoint, verbal discussion, and written material to support subject matter. Instructor reviews what METs are and how to increase METs.    Pulmonary Medications Verbally interactive group education provided by instructor with focus on inhaled  medications and proper administration.   Anatomy and Physiology of the Respiratory System Group instruction provided by PowerPoint, verbal discussion, and written material to support subject matter. Instructor reviews respiratory cycle and anatomical components of the respiratory system and their functions. Instructor also reviews differences in obstructive and restrictive respiratory diseases with examples of each.  Flowsheet Row PULMONARY REHAB OTHER RESPIRATORY from 12/03/2021 in [place]  Date 12/03/21  Educator EP  Instruction Review Code 1- Verbalizes Understanding  Oxygen Safety Group instruction provided by PowerPoint, verbal discussion, and written material to support subject matter. There is an overview of "What is Oxygen" and "Why do we need it".  Instructor also reviews how to create a safe environment for oxygen use, the importance of using oxygen as prescribed, and the risks of noncompliance. There is a brief discussion on traveling with oxygen and resources the patient may utilize.   Oxygen Use Group instruction provided by PowerPoint, verbal discussion, and written material to discuss how supplemental oxygen is prescribed and different types of oxygen supply systems. Resources for more information are provided.    Breathing Techniques Group instruction that is supported by demonstration and informational handouts. Instructor discusses the benefits of pursed lip and diaphragmatic breathing and detailed demonstration on how to perform both.     Risk Factor Reduction Group instruction that is supported by a PowerPoint presentation. Instructor discusses the definition of a risk factor, different risk factors for pulmonary disease, and how the heart and lungs work together.   MD Day A group question and answer session with a medical doctor that allows participants to ask questions that relate to their pulmonary disease  state.   Nutrition for the Pulmonary Patient Group instruction provided by PowerPoint slides, verbal discussion, and written materials to support subject matter. The instructor gives an explanation and review of healthy diet recommendations, which includes a discussion on weight management, recommendations for fruit and vegetable consumption, as well as protein, fluid, caffeine, fiber, sodium, sugar, and alcohol. Tips for eating when patients are short of breath are discussed.    Other Education Group or individual verbal, written, or video instructions that support the educational goals of the pulmonary rehab program.    Knowledge Questionnaire Score:  Knowledge Questionnaire Score - 11/25/21 1452       Knowledge Questionnaire Score   Pre Score 11/18             Core Components/Risk Factors/Patient Goals at Admission:  Personal Goals and Risk Factors at Admission - 12/03/21 1234       Core Components/Risk Factors/Patient Goals on Admission   Tobacco Cessation Yes    Number of packs per day 10 cigars qd    Intervention Assist the participant in steps to quit. Provide individualized education and counseling about committing to Tobacco Cessation, relapse prevention, and pharmacological support that can be provided by physician.;Advice worker, assist with locating and accessing local/national Quit Smoking programs, and support quit date choice.    Expected Outcomes Short Term: Will demonstrate readiness to quit, by selecting a quit date.;Short Term: Will quit all tobacco product use, adhering to prevention of relapse plan.;Long Term: Complete abstinence from all tobacco products for at least 12 months from quit date.             Core Components/Risk Factors/Patient Goals Review:   Goals and Risk Factor Review     Row Name 12/11/21 0726             Core Components/Risk Factors/Patient Goals Review   Personal Goals Review Weight Management/Obesity;Tobacco  Cessation;Improve shortness of breath with ADL's;Develop more efficient breathing techniques such as purse lipped breathing and diaphragmatic breathing and practicing self-pacing with activity.       Review Siobhan Zaro has completed 3 exercise sessions. He has increased his level on the Nustep. Staff have discussed quitting smoking with [name]. He was told staff that he was in the process of cutting down his amount of cigars. He has not set  a quit date yet. Will continue to assess.       Expected Outcomes See admission goals.                Core Components/Risk Factors/Patient Goals at Discharge (Final Review):   Goals and Risk Factor Review - 12/11/21 0726       Core Components/Risk Factors/Patient Goals Review   Personal Goals Review Weight Management/Obesity;Tobacco Cessation;Improve shortness of breath with ADL's;Develop more efficient breathing techniques such as purse lipped breathing and diaphragmatic breathing and practicing self-pacing with activity.    Review Isaak has completed 3 exercise sessions. He has increased his level on the Nustep. Staff have discussed quitting smoking with [name]. He was told staff that he was in the process of cutting down his amount of cigars. He has not set a quit date yet. Will continue to assess.    Expected Outcomes See admission goals.             ITP Comments: ITP REVIEW Pt is making expected progress toward pulmonary rehab goals after completing 4 sessions. Recommend continued exercise, life style modification, education, and utilization of breathing techniques to increase stamina and strength and decrease shortness of breath with exertion.   Comments: Dr. Rodman Pickle is Medical Director for Pulmonary Rehab at [facility].

## 2021-12-17 ENCOUNTER — Encounter (HOSPITAL_COMMUNITY)
Admission: RE | Admit: 2021-12-17 | Discharge: 2021-12-17 | Disposition: A | Discharge status: Home / Self Care | Payer: Medicaid Other | Source: Ambulatory Visit | Attending: Internal Medicine | Admitting: Internal Medicine

## 2021-12-17 VITALS — Wt 310.0 lb

## 2021-12-17 DIAGNOSIS — I5032 Chronic diastolic (congestive) heart failure: Secondary | ICD-10-CM

## 2021-12-17 DIAGNOSIS — M5136 Other intervertebral disc degeneration, lumbar region: Secondary | ICD-10-CM | POA: Diagnosis not present

## 2021-12-17 NOTE — Progress Notes (Signed)
Daily Session Note  Patient Details  Name: Tacoma Merida MRN: [id number] Date of Birth: Jun 19, 1960 Referring Provider:   April Manson Pulmonary Rehab Walk Test from 11/25/2021 in [place]  Referring Provider Casas       Encounter Date: 12/17/2021  Check In:  Session Check In - 12/17/21 1126       Check-In   Supervising physician immediately available to respond to emergencies [facility] - Physician supervision    Physician(s) Ander Slade    Location [facility]    Staff Present Elmon Else, MS, ACSM-CEP, Exercise Physiologist;David Lilyan Punt, MS, ACSM-CEP, CCRP, Exercise Physiologist;Samantha [place], RD, LDN;Montserrath Madding Ysidro Evert, RN;Carlette Southgate, RN, BSN;Randi Reeve BS, ACSM-CEP, Exercise Physiologist;Jetta Walker BS, ACSM-CEP, Exercise Physiologist    Virtual Visit No    Medication changes reported     No    Fall or balance concerns reported    No    Tobacco Cessation No Change    Warm-up and Cool-down Performed as group-led Higher education careers adviser Performed Yes    VAD Patient? No    PAD/SET Patient? No      Pain Assessment   Currently in Pain? No/denies             Capillary Blood Glucose: No results found for this or any previous visit (from the past 24 hour(s)).   Exercise Prescription Changes - 12/17/21 1200       Response to Exercise   Blood Pressure (Admit) 130/68    Blood Pressure (Exercise) 120/74    Blood Pressure (Exit) 114/68    Heart Rate (Admit) 97 bpm    Heart Rate (Exercise) 106 bpm    Heart Rate (Exit) 87 bpm    Oxygen Saturation (Admit) 97 %    Oxygen Saturation (Exercise) 96 %    Oxygen Saturation (Exit) 97 %    Rating of Perceived Exertion (Exercise) 15    Perceived Dyspnea (Exercise) 1    Duration Progress to 30 minutes of  aerobic without signs/symptoms of physical distress    Intensity THRR unchanged      Progression   Progression Continue to progress workloads to maintain intensity  without signs/symptoms of physical distress.      Resistance Training   Training Prescription Yes    Weight blue bands    Reps 10-15    Time 10 Minutes      T5 Nustep   Level 4    SPM 80    Minutes 20    METs 2.6      Track   Laps 2    Minutes 5             Social History   Tobacco Use  Smoking Status Every Day   Packs/day: 0.50   Years: 54.00   Total pack years: 27.00   Types: Cigarettes, Cigars   Passive exposure: Never  Smokeless Tobacco Never  Tobacco Comments   1/2 to 1 pack daily    Goals Met:  Proper associated with RPD/PD & O2 Sat No report of concerns or symptoms today Strength training completed today  Goals Unmet:  Not Applicable  Comments: Service time is from 1003 to 1138    Dr. Rodman Pickle is Medical Director for Pulmonary Rehab at [facility].

## 2021-12-18 DIAGNOSIS — M5136 Other intervertebral disc degeneration, lumbar region: Secondary | ICD-10-CM | POA: Diagnosis not present

## 2021-12-21 DIAGNOSIS — M5136 Other intervertebral disc degeneration, lumbar region: Secondary | ICD-10-CM | POA: Diagnosis not present

## 2021-12-22 ENCOUNTER — Encounter (HOSPITAL_COMMUNITY): Payer: Medicaid Other

## 2021-12-22 DIAGNOSIS — M5136 Other intervertebral disc degeneration, lumbar region: Secondary | ICD-10-CM | POA: Diagnosis not present

## 2021-12-23 ENCOUNTER — Other Ambulatory Visit (HOSPITAL_COMMUNITY): Payer: Self-pay

## 2021-12-23 ENCOUNTER — Telehealth (HOSPITAL_COMMUNITY): Payer: Self-pay | Admitting: Student

## 2021-12-23 DIAGNOSIS — M5136 Other intervertebral disc degeneration, lumbar region: Secondary | ICD-10-CM | POA: Diagnosis not present

## 2021-12-24 ENCOUNTER — Telehealth (HOSPITAL_COMMUNITY): Payer: Self-pay | Admitting: *Deleted

## 2021-12-24 ENCOUNTER — Encounter (HOSPITAL_COMMUNITY): Payer: Medicaid Other

## 2021-12-24 DIAGNOSIS — M5136 Other intervertebral disc degeneration, lumbar region: Secondary | ICD-10-CM | POA: Diagnosis not present

## 2021-12-24 NOTE — Telephone Encounter (Signed)
Russell Collier had called out yesterday stating that he has a head cold. I called him to encourage him to take a Covid test or to get tested. I was not able to reach him but, I was able to leave him a message.

## 2021-12-25 DIAGNOSIS — M5136 Other intervertebral disc degeneration, lumbar region: Secondary | ICD-10-CM | POA: Diagnosis not present

## 2021-12-28 DIAGNOSIS — M5136 Other intervertebral disc degeneration, lumbar region: Secondary | ICD-10-CM | POA: Diagnosis not present

## 2021-12-29 ENCOUNTER — Encounter (HOSPITAL_COMMUNITY): Payer: Medicaid Other

## 2021-12-29 DIAGNOSIS — M5136 Other intervertebral disc degeneration, lumbar region: Secondary | ICD-10-CM | POA: Diagnosis not present

## 2021-12-30 DIAGNOSIS — M5136 Other intervertebral disc degeneration, lumbar region: Secondary | ICD-10-CM | POA: Diagnosis not present

## 2021-12-31 ENCOUNTER — Telehealth (HOSPITAL_COMMUNITY): Payer: Self-pay

## 2021-12-31 ENCOUNTER — Encounter (HOSPITAL_COMMUNITY)
Admission: RE | Admit: 2021-12-31 | Discharge: 2021-12-31 | Disposition: A | Discharge status: Home / Self Care | Payer: Medicaid Other | Source: Ambulatory Visit | Attending: Internal Medicine | Admitting: Internal Medicine

## 2021-12-31 DIAGNOSIS — I5032 Chronic diastolic (congestive) heart failure: Secondary | ICD-10-CM | POA: Diagnosis not present

## 2021-12-31 DIAGNOSIS — I272 Pulmonary hypertension, unspecified: Secondary | ICD-10-CM | POA: Diagnosis present

## 2021-12-31 DIAGNOSIS — M5136 Other intervertebral disc degeneration, lumbar region: Secondary | ICD-10-CM | POA: Diagnosis not present

## 2021-12-31 NOTE — Telephone Encounter (Signed)
Called Russell Collier to check on him. Had to leave a message with call back number.

## 2021-12-31 NOTE — Progress Notes (Signed)
Daily Session Note  Patient Details  Name: Russell Collier MRN: [id number] Date of Birth: 04/02/1960 Referring Provider:   April Manson Pulmonary Rehab Walk Test from 11/25/2021 in [facility] [place]  Referring Provider [name]       Encounter Date: 12/31/2021  Check In:  Session Check In - 12/31/21 1205       Check-In   Supervising physician immediately available to respond to emergencies [facility] - Physician supervision    Physician(s) Erskine Emery    Location [facility]    Staff Present Elmon Else, MS, ACSM-CEP, Exercise Physiologist;Aylinn Rydberg Ysidro Evert, RN    Virtual Visit No    Medication changes reported     No    Fall or balance concerns reported    No    Tobacco Cessation No Change    Warm-up and Cool-down Performed as group-led instruction    Resistance Training Performed Yes    VAD Patient? No    PAD/SET Patient? No      Pain Assessment   Currently in Pain? No/denies    Pain Score 0-No pain    Multiple Pain Sites No             Capillary Blood Glucose: No results found for this or any previous visit (from the past 24 hour(s)).    Social History   Tobacco Use  Smoking Status Every Day   Packs/day: 0.50   Years: 54.00   Total pack years: 27.00   Types: Cigarettes, Cigars   Passive exposure: Never  Smokeless Tobacco Never  Tobacco Comments   1/2 to 1 pack daily    Goals Met:  Proper associated with RPD/PD & O2 Sat Exercise tolerated well No report of concerns or symptoms today Strength training completed today  Goals Unmet:  Not Applicable  Comments: Service time is from 1025 to [place]    Dr. Rodman Pickle is Medical Director for Pulmonary Rehab at [facility].

## 2022-01-01 DIAGNOSIS — M5136 Other intervertebral disc degeneration, lumbar region: Secondary | ICD-10-CM | POA: Diagnosis not present

## 2022-01-04 DIAGNOSIS — M5136 Other intervertebral disc degeneration, lumbar region: Secondary | ICD-10-CM | POA: Diagnosis not present

## 2022-01-05 ENCOUNTER — Encounter (HOSPITAL_COMMUNITY): Payer: Medicaid Other

## 2022-01-05 DIAGNOSIS — M5136 Other intervertebral disc degeneration, lumbar region: Secondary | ICD-10-CM | POA: Diagnosis not present

## 2022-01-06 ENCOUNTER — Telehealth (HOSPITAL_COMMUNITY): Payer: Self-pay | Admitting: Student

## 2022-01-06 DIAGNOSIS — M5136 Other intervertebral disc degeneration, lumbar region: Secondary | ICD-10-CM | POA: Diagnosis not present

## 2022-01-07 ENCOUNTER — Encounter (HOSPITAL_COMMUNITY): Payer: Medicaid Other

## 2022-01-07 DIAGNOSIS — M5136 Other intervertebral disc degeneration, lumbar region: Secondary | ICD-10-CM | POA: Diagnosis not present

## 2022-01-08 ENCOUNTER — Other Ambulatory Visit (HOSPITAL_COMMUNITY): Payer: Self-pay

## 2022-01-08 DIAGNOSIS — M5136 Other intervertebral disc degeneration, lumbar region: Secondary | ICD-10-CM | POA: Diagnosis not present

## 2022-01-11 ENCOUNTER — Other Ambulatory Visit (HOSPITAL_COMMUNITY): Payer: Self-pay

## 2022-01-11 DIAGNOSIS — M5136 Other intervertebral disc degeneration, lumbar region: Secondary | ICD-10-CM | POA: Diagnosis not present

## 2022-01-12 ENCOUNTER — Encounter (HOSPITAL_COMMUNITY)
Admission: RE | Admit: 2022-01-12 | Discharge: 2022-01-12 | Disposition: A | Discharge status: Home / Self Care | Payer: Medicaid Other | Source: Ambulatory Visit | Attending: Internal Medicine | Admitting: Internal Medicine

## 2022-01-12 ENCOUNTER — Encounter (HOSPITAL_COMMUNITY): Payer: Medicaid Other

## 2022-01-12 VITALS — Wt 319.9 lb

## 2022-01-12 DIAGNOSIS — I5032 Chronic diastolic (congestive) heart failure: Secondary | ICD-10-CM | POA: Diagnosis not present

## 2022-01-12 DIAGNOSIS — M5136 Other intervertebral disc degeneration, lumbar region: Secondary | ICD-10-CM | POA: Diagnosis not present

## 2022-01-12 NOTE — Progress Notes (Signed)
Daily Session Note  Patient Details  Name: Russell Collier MRN: [id number] Date of Birth: 1960-12-05 Referring Provider:   April Manson Pulmonary Rehab Walk Test from 11/25/2021 in [facility] [place]  Referring Provider [name]       Encounter Date: 01/12/2022  Check In:  Session Check In - 01/12/22 1615       Check-In   Supervising physician immediately available to respond to emergencies [facility] - Physician supervision    Physician(s) Ernest Mallick    [facility]    Staff Present Elmon Else, MS, ACSM-CEP, Exercise Physiologist;Lisa Ysidro Evert, RN;Samantha [place], RD, LDN;Other;Bailey Pearline Cables, MS, Exercise Physiologist;Randi Yevonne Pax, ACSM-CEP, Exercise Physiologist    Virtual Visit No    Medication changes reported     No    Fall or balance concerns reported    No    Tobacco Cessation No Change    Warm-up and Cool-down Performed as group-led instruction    Resistance Training Performed Yes    VAD Patient? No    PAD/SET Patient? No      Pain Assessment   Currently in Pain? No/denies             Capillary Blood Glucose: No results found for this or any previous visit (from the past 24 hour(s)).   Exercise Prescription Changes - 01/12/22 1600       Response to Exercise   Blood Pressure (Admit) 102/56    Blood Pressure (Exercise) 124/82    Blood Pressure (Exit) 120/72    Heart Rate (Admit) 74 bpm    Heart Rate (Exercise) 88 bpm    Heart Rate (Exit) 88 bpm    Oxygen Saturation (Admit) 98 %    Oxygen Saturation (Exercise) 96 %    Oxygen Saturation (Exit) 98 %    Rating of Perceived Exertion (Exercise) 13    Perceived Dyspnea (Exercise) 0    Duration Progress to 30 minutes of  aerobic without signs/symptoms of physical distress    Intensity THRR unchanged      Progression   Progression Continue to progress workloads to maintain intensity without signs/symptoms of physical distress.       Resistance Training   Training Prescription Yes    Weight --   blue bands   Reps 10-15    Time 10 Minutes      T5 Nustep   Level 4    SPM 80    Minutes 15    METs 2.4      Track   Laps 5    Minutes 10    METs 2.38             Social History   Tobacco Use  Smoking Status Every Day   Packs/day: 0.50   Years: 54.00   Total pack years: 27.00   Types: Cigarettes, Cigars   Passive exposure: Never  Smokeless Tobacco Never  Tobacco Comments   1/2 to 1 pack daily    Goals Met:  Proper associated with RPD/PD & O2 Sat [place] with exercise equipment Exercise tolerated well No report of concerns or symptoms today  Goals Unmet:  Not Applicable  Comments: Service time is from 1329 to [name]    Dr. Rodman Pickle is Medical Director for Pulmonary Rehab at [facility].

## 2022-01-13 DIAGNOSIS — M5136 Other intervertebral disc degeneration, lumbar region: Secondary | ICD-10-CM | POA: Diagnosis not present

## 2022-01-13 NOTE — Progress Notes (Signed)
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 It has been difficult to progress him recently as his attendance has been lacking. Staff have called to check on him, but messages have to be left. Will continue to monitor and progress as able.      Expected Outcomes Through exercise at rehab and home, the patient will decrease shortness of breath with daily activities and feel confident in carrying out an exercise regimen at home. Through exercise at rehab and home, the patient will decrease shortness of breath with daily activities and feel confident in carrying out an exercise regimen at home.               Discharge Exercise Prescription (Final Exercise Prescription Changes):  Exercise Prescription Changes - 01/12/22 1600       Response to Exercise   Blood Pressure (Admit) 102/56     Blood Pressure (Exercise) 124/82    Blood Pressure (Exit) 120/72    Heart Rate (Admit) 74 bpm    Heart Rate (Exercise) 88 bpm    Heart Rate (Exit) 88 bpm    Oxygen Saturation (Admit) 98 %    Oxygen Saturation (Exercise) 96 %    Oxygen Saturation (Exit) 98 %    Rating of Perceived Exertion (Exercise) 13    Perceived Dyspnea (Exercise) 0    Duration Progress to 30 minutes of  aerobic without signs/symptoms of physical distress    Intensity THRR unchanged  Progression   Progression Continue to progress workloads to maintain intensity without signs/symptoms of physical distress.      Resistance Training   Training Prescription Yes    Weight --   blue bands   Reps 10-15    Time 10 Minutes      T5 Nustep   Level 4    SPM 80    Minutes 15    METs 2.4      Track   Laps 5    Minutes 10    METs 2.38             Nutrition:  Target Goals: Understanding of nutrition guidelines, daily intake of sodium <1537m, cholesterol <2072m calories 30% from fat and 7% or less from saturated fats, daily to have 5 or more servings of fruits and vegetables.  Biometrics:  Pre Biometrics - 11/25/21 1104       Pre Biometrics   Grip Strength 30 kg              Nutrition Therapy Plan and Nutrition Goals:  Nutrition Therapy & Goals - 12/15/21 1125       Nutrition Therapy   Diet Heart Healthy/Carbohydrate Consistent Diet    Drug/Food Interactions Statins/Certain Fruits      Personal Nutrition Goals   Nutrition Goal Patient to use the plate method as a guide for meal planning to include lean protein/plant protein, fruit, vegetables, whole grains, and non fat dairy as part of heart healthy diet.    Personal Goal #2 Patient to reduce sodium intake to <2000-150098mer day    Personal Goal #3 Patient to identiy food sources and limit daily intake of saturated fat, trans fat, sodium, and refined carbohydrates    Comments RobDerrikmains pre-contemplative toward dietary changes. He  reports motivation to lose weight. However, he reports eating out twice daily everyday ([place] and skipping breakfast. We discussed strategies for weight loss including reducing fried foods, reducing/eliminating sugar drinks, reducing portions and increasing non-starchy vegetables. He was open to reducing portion sizes of entree (i.e. decreasing number of whoppers).      Intervention Plan   Intervention Prescribe, educate and counsel regarding individualized specific dietary modifications aiming towards targeted core components such as weight, hypertension, lipid management, diabetes, heart failure and other comorbidities.;Nutrition handout(s) given to patient.    Expected Outcomes Short Term Goal: Understand basic principles of dietary content, such as calories, fat, sodium, cholesterol and nutrients.;Long Term Goal: Adherence to prescribed nutrition plan.             Nutrition Assessments:  MEDIFICTS Score Key: ?70 Need to make dietary changes  40-70 Heart Healthy Diet ? 40 Therapeutic Level Cholesterol Diet   Picture Your Plate Scores: <40<25healthy dietary pattern with much room for improvement. 41-50 Dietary pattern unlikely to meet recommendations for good health and room for improvement. 51-60 More healthful dietary pattern, with some room for improvement.  >60 Healthy dietary pattern, although there may be some specific behaviors that could be improved.    Nutrition Goals Re-Evaluation:  Nutrition Goals Re-Evaluation     [place] 12/01/21 1155 12/15/21 1125           Goals   Current Weight 316 lb 2.2 oz (143.4 kg) 311 lb 4.6 oz (141.2 kg)      Comment A1c 6.5, he is not currently taking any diabetes medications A1c 6.5; he does report taking MVI daily      Expected Outcome Mr. TayLovena Le  reports eating two meals per day and eating out often. He reports history of constipation and he does take daily stool softener for this. He lives at home with  three roommates and does very little cooking. He has previously completed cardiac rehab. Anan remains pre-contemplative toward dietary changes. He reports motivation to lose weight. However, he reports eating out twice daily everyday [place], [place], etc) and skipping breakfast. We discussed strategies for weight loss including reducing fried foods, reducing/eliminating sugar drinks, reducing portions and increasing non-starchy vegetables. He was open to reducing portion sizes of entree (i.e. decreasing number of whoppers). He is down 3.7# since starting with our program.               Nutrition Goals Discharge (Final Nutrition Goals Re-Evaluation):  Nutrition Goals Re-Evaluation - 12/15/21 1125       Goals   Current Weight 311 lb 4.6 oz (141.2 kg)    Comment A1c 6.5; he does report taking MVI daily    Expected Outcome Clayburn remains pre-contemplative toward dietary changes. He reports motivation to lose weight. However, he reports eating out twice daily everyday [place], [place], etc) and skipping breakfast. We discussed strategies for weight loss including reducing fried foods, reducing/eliminating sugar drinks, reducing portions and increasing non-starchy vegetables. He was open to reducing portion sizes of entree (i.e. decreasing number of whoppers). He is down 3.7# since starting with our program.             Psychosocial: Target Goals: Acknowledge presence or absence of significant depression and/or stress, maximize coping skills, provide positive support system. Participant is able to verbalize types and ability to use techniques and skills needed for reducing stress and depression.  Initial Review & Psychosocial Screening:  Initial Psych Review & Screening - 11/25/21 1224       Initial Review   Current issues with None Identified      Family Dynamics   Good Support System? Yes   sister, home health aide and 2 daughters     Barriers   Psychosocial  barriers to participate in program There are no identifiable barriers or psychosocial needs.      Screening Interventions   Interventions Encouraged to exercise    Expected Outcomes Short Term goal: Utilizing psychosocial counselor, staff and physician to assist with identification of specific Stressors or current issues interfering with healing process. Setting desired goal for each stressor or current issue identified.;Long Term Goal: Stressors or current issues are controlled or eliminated.             Quality of Life Scores:  Scores of 19 and below usually indicate a poorer quality of life in these areas.  A difference of  2-3 points is a clinically meaningful difference.  A difference of 2-3 points in the total score of the Quality of Life Index has been associated with significant improvement in overall quality of life, self-image, physical symptoms, and general health in studies assessing change in quality of life.  PHQ-9: Review Flowsheet       11/25/2021 02/12/2021 12/23/2020 03/25/2020  Depression screen PHQ 2/9  Decreased Interest 0 0 0 0  Down, Depressed, Hopeless 0 0 0 0  PHQ - 2 Score 0 0 0 0  Altered sleeping 0 0 0 0  Tired, decreased energy 1 0 0 0  Change in appetite 3 0 0 0  Feeling bad or failure about yourself  0 0 0 0  Trouble concentrating 0 0 0 0  Moving slowly or fidgety/restless 0 0 0 0  Suicidal thoughts 0 0 0 0  PHQ-9 Score 4 0 0 0  Difficult doing work/chores Not difficult at all - Not difficult at all -   Interpretation of Total Score  Total Score Depression Severity:  1-4 = Minimal depression, 5-9 = Mild depression, 10-14 = Moderate depression, 15-19 = Moderately severe depression, 20-27 = Severe depression   Psychosocial Evaluation and Intervention:  Psychosocial Evaluation - 11/25/21 1220       Psychosocial Evaluation & Interventions   Interventions Encouraged to exercise with the program and follow exercise prescription    Expected Outcomes  That Gilmar will be able to attend the PR program without any psychosocial barriers.    Continue Psychosocial Services  No Follow up required             Psychosocial Re-Evaluation:  Psychosocial Re-Evaluation     [place] Name 12/11/21 0725 01/06/22 9211           Psychosocial Re-Evaluation   Current issues with None Identified None Identified      Comments Sotero has been participating in rehab free of psychosocial concerns. Burnard is still participating in rehab free of psychosocial concerns.      Expected Outcomes For Kael to participate in reahb free of psychosocial concerns. For Pierce to participate in reahb free of psychosocial concerns.      Interventions Encouraged to attend Pulmonary Rehabilitation for the exercise Encouraged to attend Pulmonary Rehabilitation for the exercise      Continue Psychosocial Services  No Follow up required No Follow up required               Psychosocial Discharge (Final Psychosocial Re-Evaluation):  Psychosocial Re-Evaluation - 01/06/22 0821       Psychosocial Re-Evaluation   Current issues with None Identified    Comments Gurpreet is still participating in rehab free of psychosocial concerns.    Expected Outcomes For [place]ne to participate in reahb free of psychosocial concerns.    Interventions Encouraged to attend Pulmonary Rehabilitation for the exercise    Continue Psychosocial Services  No Follow up required             Education: Education Goals: Education classes will be provided on a weekly basis, covering required topics. Participant will state understanding/return demonstration of topics presented.  Learning Barriers/Preferences:  Learning Barriers/Preferences - 11/25/21 1503       Learning Barriers/Preferences   Learning Barriers Exercise Concerns   Due to chronic back pain   Learning Preferences None             Education Topics: Introduction to Pulmonary Rehab Group instruction provided by PowerPoint,  verbal discussion, and written material to support subject matter. Instructor reviews what Pulmonary Rehab is, the purpose of the program, and how patients are referred.     Know Your Numbers Group instruction that is supported by a PowerPoint presentation. Instructor discusses importance of knowing and understanding resting, exercise, and post-exercise oxygen saturation, heart rate, and blood pressure. Oxygen saturation, heart rate, blood pressure, rating of perceived exertion, and dyspnea are reviewed along with a normal range for these values.    Exercise for the Pulmonary Patient Group instruction that is supported by a PowerPoint presentation. Instructor discusses benefits of exercise, core components of exercise, frequency, duration, and intensity of an exercise routine, importance of utilizing pulse oximetry during exercise, safety while exercising, and options of places to exercise outside of rehab.  MET Level  Group instruction provided by PowerPoint, verbal discussion, and written material to support subject matter. Instructor reviews what METs are and how to increase METs.    Pulmonary Medications Verbally interactive group education provided by instructor with focus on inhaled medications and proper administration.   Anatomy and Physiology of the Respiratory System Group instruction provided by PowerPoint, verbal discussion, and written material to support subject matter. Instructor reviews respiratory cycle and anatomical components of the respiratory system and their functions. Instructor also reviews differences in obstructive and restrictive respiratory diseases with examples of each.  Flowsheet Row PULMONARY REHAB OTHER RESPIRATORY from 12/31/2021 in [facility], [facility], [facility] Health  Date 12/03/21  Educator EP  Instruction Review Code 1- Verbalizes Understanding       Oxygen Safety Group instruction provided by PowerPoint,  verbal discussion, and written material to support subject matter. There is an overview of "What is Oxygen" and "Why do we need it".  Instructor also reviews how to create a safe environment for oxygen use, the importance of using oxygen as prescribed, and the risks of noncompliance. There is a brief discussion on traveling with oxygen and resources the patient may utilize. Flowsheet Row PULMONARY REHAB OTHER RESPIRATORY from 12/31/2021 in [facility] for Heart, Vascular, & Lung Health  Date 12/31/21  Educator Donnetta Simpers  [Handout]  Instruction Review Code 1- Verbalizes Understanding       Oxygen Use Group instruction provided by PowerPoint, verbal discussion, and written material to discuss how supplemental oxygen is prescribed and different types of oxygen supply systems. Resources for more information are provided.    Breathing Techniques Group instruction that is supported by demonstration and informational handouts. Instructor discusses the benefits of pursed lip and diaphragmatic breathing and detailed demonstration on how to perform both.     Risk Factor Reduction Group instruction that is supported by a PowerPoint presentation. Instructor discusses the definition of a risk factor, different risk factors for pulmonary disease, and how the heart and lungs work together.   MD Day A group question and answer session with a medical doctor that allows participants to ask questions that relate to their pulmonary disease state.   Nutrition for the Pulmonary Patient Group instruction provided by PowerPoint slides, verbal discussion, and written materials to support subject matter. The instructor gives an explanation and review of healthy diet recommendations, which includes a discussion on weight management, recommendations for fruit and vegetable consumption, as well as protein, fluid, caffeine, fiber, sodium, sugar, and alcohol. Tips for eating when patients are short of  breath are discussed.    Other Education Group or individual verbal, written, or video instructions that support the educational goals of the pulmonary rehab program.    Knowledge Questionnaire Score:  Knowledge Questionnaire Score - 11/25/21 1452       Knowledge Questionnaire Score   Pre Score 11/18             Core Components/Risk Factors/Patient Goals at Admission:  Personal Goals and Risk Factors at Admission - 12/03/21 1234       Core Components/Risk Factors/Patient Goals on Admission   Tobacco Cessation Yes    Number of packs per day 10 cigars qd    Intervention Assist the participant in steps to quit. Provide individualized education and counseling about committing to Tobacco Cessation, relapse prevention, and pharmacological support that can be provided by physician.;Advice worker, assist with locating and accessing local/national Quit Smoking programs, and  support quit date choice.    Expected Outcomes Short Term: Will demonstrate readiness to quit, by selecting a quit date.;Short Term: Will quit all tobacco product use, adhering to prevention of relapse plan.;Long Term: Complete abstinence from all tobacco products for at least 12 months from quit date.             Core Components/Risk Factors/Patient Goals Review:   Goals and Risk Factor Review     Row Name 12/11/21 0726 01/06/22 0824           Core Components/Risk Factors/Patient Goals Review   Personal Goals Review Weight Management/Obesity;Tobacco Cessation;Improve shortness of breath with ADL's;Develop more efficient breathing techniques such as purse lipped breathing and diaphragmatic breathing and practicing self-pacing with activity. Weight Management/Obesity;Tobacco Cessation;Improve shortness of breath with ADL's;Develop more efficient breathing techniques such as purse lipped breathing and diaphragmatic breathing and practicing self-pacing with activity.      Review Farrel has  completed 3 exercise sessions. He has increased his level on the Nustep. Staff have discussed quitting smoking with [name]. He was told staff that he was in the process of cutting down his amount of cigars. He has not set a quit date yet. Will continue to assess. Copelan has completed 6 exercise sessions. He progressed to exercising on track for 10 min and tolerates this well. Jimy's attendance has been lacking recently. He was ill for a bit but is now not attending without calling. Staff have called [place] but have yet to talk to him.      Expected Outcomes See admission goals. See admission goals.               Core Components/Risk Factors/Patient Goals at Discharge (Final Review):   Goals and Risk Factor Review - 01/06/22 0824       Core Components/Risk Factors/Patient Goals Review   Personal Goals Review Weight Management/Obesity;Tobacco Cessation;Improve shortness of breath with ADL's;Develop more efficient breathing techniques such as purse lipped breathing and diaphragmatic breathing and practicing self-pacing with activity.    Review Kacyn has completed 6 exercise sessions. He progressed to exercising on track for 10 min and tolerates this well. Willey's attendance has been lacking recently. He was ill for a bit but is now not attending without calling. Staff have called [place] but have yet to talk to him.    Expected Outcomes See admission goals.             ITP Comments:   Comments: Pt is making expected progress toward Pulmonary Rehab goals after completing 7 sessions. Recommend continued exercise, life style modification, education, and utilization of breathing techniques to increase stamina and strength, while also decreasing shortness of breath with exertion.  Dr. Rodman Pickle is Medical Director for Pulmonary Rehab at [facility].

## 2022-01-14 DIAGNOSIS — M5136 Other intervertebral disc degeneration, lumbar region: Secondary | ICD-10-CM | POA: Diagnosis not present

## 2022-01-15 DIAGNOSIS — M5136 Other intervertebral disc degeneration, lumbar region: Secondary | ICD-10-CM | POA: Diagnosis not present

## 2022-01-18 ENCOUNTER — Ambulatory Visit (INDEPENDENT_AMBULATORY_CARE_PROVIDER_SITE_OTHER): Payer: Medicaid Other | Admitting: Student

## 2022-01-18 ENCOUNTER — Encounter: Payer: Self-pay | Admitting: Student

## 2022-01-18 VITALS — BP 108/66 | HR 90 | Ht 70.0 in | Wt 315.8 lb

## 2022-01-18 DIAGNOSIS — B009 Herpesviral infection, unspecified: Secondary | ICD-10-CM | POA: Diagnosis not present

## 2022-01-18 DIAGNOSIS — E1151 Type 2 diabetes mellitus with diabetic peripheral angiopathy without gangrene: Secondary | ICD-10-CM

## 2022-01-18 DIAGNOSIS — M5136 Other intervertebral disc degeneration, lumbar region: Secondary | ICD-10-CM | POA: Diagnosis not present

## 2022-01-18 DIAGNOSIS — E119 Type 2 diabetes mellitus without complications: Secondary | ICD-10-CM

## 2022-01-18 LAB — POCT GLYCOSYLATED HEMOGLOBIN (HGB A1C): HbA1c, POC (controlled diabetic range): 6.1 % (ref 0.0–7.0)

## 2022-01-18 MED ORDER — VALACYCLOVIR HCL 1 G PO TABS: 1000.0000 mg | ORAL_TABLET | Freq: Every day | ORAL | 0 refills | Status: AC

## 2022-01-18 MED ORDER — TRIAMCINOLONE ACETONIDE 0.5 % EX OINT: 1.0000 | TOPICAL_OINTMENT | Freq: Two times a day (BID) | CUTANEOUS | 0 refills | Status: DC

## 2022-01-18 NOTE — Patient Instructions (Addendum)
It was wonderful to see you today. Thank you for allowing me to be a part of your care. Below is a short summary of what we discussed at your visit today:  A1c today improved to 6.1 from 6.5 about 5 months ago.  Also your foot exam was normal.  Today we collected urine sample to test for your kidney function.  Please see an ophthalmologist for annual eye exam.  I have increased your Valtrex to 1000 mg daily due to increasing outbreak.  Also for the rash on your back and your legs I have refilled your Kenalog cream which you should apply and also make sure to keep your skin moisturized  You have an appointment with your cardiologist on 12/28.  Please follow-up with our pharmacy team for medication reconciliation.    Please bring all of your medications to every appointment!  If you have any questions or concerns, please do not hesitate to contact us via phone or MyChart message.   Alen Bleacher, MD [facility]

## 2022-01-18 NOTE — Assessment & Plan Note (Addendum)
Patient with well-controlled diabetes.  A1c improved to 6.1 and endorses compliance and tolerance to Farxiga 10 mg daily.  Annual foot exam was  normal today. -Discussed importance of weight loss and dieting -Urine samples for microalbuminuria wasn't collected due to poor handling by patient causing loss of sample -Advised patient to continue present medication -Encourage patient to follow-up with ophthalmologist for his annual eye exam

## 2022-01-18 NOTE — Assessment & Plan Note (Addendum)
Reports increased outbreak on suppressive therapy.  Has been diagnosed with HSV since 1995 and on suppressive Valtrex 500 mg since that time.  Given recent increase in outbreak we will increase his Valtrex to 1000 mg daily.

## 2022-01-18 NOTE — Progress Notes (Signed)
    SUBJECTIVE:   CHIEF COMPLAINT / HPI:   Diabetes Patient's current diabetic medications include Farxiga 10 mg daily tlerating well without side effects.  Patient endorses compliance with these medications. Patient's last A1c was 6.5 about 5 months ago. Denies abdominal pain, blurred vision, polyuria, polydipsia, hypoglycemia. Patient states they understand that diet and exercise can help with her diabetes.  Annual foot and eye exam is due   HSV breakout Patient with history of HSV on long term suppressive Valtrex 500 mg daily.   He reports increased breakout current dose.  He is now having breakouts monthly.  Reports good tolerance with medication.  PERTINENT  PMH / PSH: Reviewed   OBJECTIVE:   BP 108/66   Pulse 90   Ht '5\' 10"'$  (1.778 m)   Wt (!) 315 lb 12.8 oz (143.2 kg)   SpO2 99%   BMI 45.31 kg/m    Physical Exam General: Alert, morbidly obese, NAD Cardiovascular: RRR, No Murmurs, Normal S2/S2 Respiratory: CTAB, No wheezing or Rales Abdomen: No distension or tenderness Extremities: No edema on extremities   Skin: Warm and dry.   ASSESSMENT/PLAN:   HSV (herpes simplex virus) infection Reports increased outbreak on suppressive therapy.  Has been diagnosed with HSV since 1995 and on suppressive Valtrex 500 mg since that time.  Given recent increase in outbreak we will increase his Valtrex to 1000 mg daily.  Type 2 diabetes mellitus without complication, without long-term current use of insulin ([place]) Patient with well-controlled diabetes.  A1c improved to 6.1 and endorses compliance and tolerance to Farxiga 10 mg daily.  Annual foot exam was  normal today. -Discussed importance of weight loss and dieting -Urine samples for microalbuminuria wasn't collected due to poor handling by patient causing loss of sample -Advised patient to continue present medication -Encourage patient to follow-up with ophthalmologist for his annual eye exam  HCM And is due for colonoscopy,  lung cancer screening and shingles vavine. He declined testing today and will reconsider      Alen Bleacher, MD [facility]

## 2022-01-19 ENCOUNTER — Other Ambulatory Visit (HOSPITAL_COMMUNITY): Payer: Self-pay | Admitting: Cardiology

## 2022-01-19 ENCOUNTER — Telehealth (HOSPITAL_COMMUNITY): Payer: Self-pay | Admitting: Student

## 2022-01-19 ENCOUNTER — Encounter (HOSPITAL_COMMUNITY): Payer: Medicaid Other

## 2022-01-19 ENCOUNTER — Other Ambulatory Visit (HOSPITAL_COMMUNITY): Payer: Self-pay

## 2022-01-19 ENCOUNTER — Other Ambulatory Visit: Payer: Self-pay | Admitting: Student

## 2022-01-19 ENCOUNTER — Telehealth: Payer: Self-pay

## 2022-01-19 DIAGNOSIS — G4733 Obstructive sleep apnea (adult) (pediatric): Secondary | ICD-10-CM

## 2022-01-19 DIAGNOSIS — I5030 Unspecified diastolic (congestive) heart failure: Secondary | ICD-10-CM

## 2022-01-19 DIAGNOSIS — I5032 Chronic diastolic (congestive) heart failure: Secondary | ICD-10-CM

## 2022-01-19 DIAGNOSIS — L97909 Non-pressure chronic ulcer of unspecified part of unspecified lower leg with unspecified severity: Secondary | ICD-10-CM

## 2022-01-19 DIAGNOSIS — I10 Essential (primary) hypertension: Secondary | ICD-10-CM

## 2022-01-19 DIAGNOSIS — M5136 Other intervertebral disc degeneration, lumbar region: Secondary | ICD-10-CM | POA: Diagnosis not present

## 2022-01-19 MED ORDER — MINERIN CREME EX CREA: TOPICAL_CREAM | CUTANEOUS | 0 refills | Status: DC

## 2022-01-19 NOTE — Telephone Encounter (Signed)
Patient calls nurse line requesting prescription for Minerin Cream. He states that when he was living in assisted living this was prescribed for his eczema. This is not on current medication list.   Please advise if this can be sent to [place].   Talbot Grumbling, RN

## 2022-01-20 DIAGNOSIS — M5136 Other intervertebral disc degeneration, lumbar region: Secondary | ICD-10-CM | POA: Diagnosis not present

## 2022-01-21 ENCOUNTER — Encounter (HOSPITAL_COMMUNITY)
Admission: RE | Admit: 2022-01-21 | Discharge: 2022-01-21 | Disposition: A | Discharge status: Home / Self Care | Payer: Medicaid Other | Source: Ambulatory Visit | Attending: Internal Medicine | Admitting: Internal Medicine

## 2022-01-21 DIAGNOSIS — I5032 Chronic diastolic (congestive) heart failure: Secondary | ICD-10-CM | POA: Diagnosis not present

## 2022-01-21 DIAGNOSIS — M5136 Other intervertebral disc degeneration, lumbar region: Secondary | ICD-10-CM | POA: Diagnosis not present

## 2022-01-21 NOTE — Progress Notes (Signed)
Daily Session Note  Patient Details  Name: Russell Collier MRN: [id number] Date of Birth: 11-13-1960 Referring Provider:   April Manson Pulmonary Rehab Walk Test from 11/25/2021 in [facility] Heart, V[facility], & [place]  Referring Provider [name]       Encounter Date: 01/21/2022  Check In:  Session Check In - 01/21/22 1150       Check-In   Supervising physician immediately available to respond to emergencies [facility] - Physician supervision    Physician(s) Dr. Vaughan Browner    Location [facility]    Staff Present Elmon Else, MS, ACSM-CEP, Exercise Physiologist;Other;Lisa Ysidro Evert, RN;Randi Olen Cordial BS, ACSM-CEP, Exercise Physiologist    Virtual Visit No    Medication changes reported     No    Fall or balance concerns reported    No    Tobacco Cessation No Change    Warm-up and Cool-down Performed as group-led instruction    Resistance Training Performed Yes    VAD Patient? No    PAD/SET Patient? No      Pain Assessment   Currently in Pain? No/denies    Multiple Pain Sites No             Capillary Blood Glucose: No results found for this or any previous visit (from the past 24 hour(s)).    Social History   Tobacco Use  Smoking Status Every Day   Packs/day: 0.50   Years: 54.00   Total pack years: 27.00   Types: Cigarettes, Cigars   Passive exposure: Never  Smokeless Tobacco Never  Tobacco Comments   1/2 to 1 pack daily    Goals Met:  Exercise tolerated well No report of concerns or symptoms today Strength training completed today  Goals Unmet:  Not Applicable  Comments: Service time is from 1021 to 1150    Dr. Rodman Pickle is Medical Director for Pulmonary Rehab at [facility].

## 2022-01-21 NOTE — Progress Notes (Signed)
Home Exercise Prescription I have reviewed a Home Exercise Prescription with [name]. Gardy is not currently exercising at home. He stated that he used to do aquatic therapy. I encouraged him to get back into aquatic therapy at least 1 non-rehab day/wk. He stated that aquatic therapy took about 30-60 min/day. I mentioned that he should try to achieve at least 30 min of exercise per day. [name] agreed with my recommendations. He stated he will try to get into aquatic therapy or check out the local fitness center. I am unsure how motivated Xaviar is to exercise at home. He has not made any lifestyle changes since starting the program. The patient stated that their goals were to gain strength and stamina. We reviewed exercise guidelines, target heart rate during exercise, RPE Scale, weather conditions, endpoints for exercise, warmup and cool down. The patient is encouraged to come to me with any questions. I will continue to follow up with the patient to assist them with progression and safety.    Sheppard Plumber, MS, ACSM-CEP 01/21/2022 12:16 PM

## 2022-01-22 DIAGNOSIS — M5136 Other intervertebral disc degeneration, lumbar region: Secondary | ICD-10-CM | POA: Diagnosis not present

## 2022-01-25 ENCOUNTER — Other Ambulatory Visit (HOSPITAL_COMMUNITY): Payer: Self-pay

## 2022-01-25 DIAGNOSIS — M5136 Other intervertebral disc degeneration, lumbar region: Secondary | ICD-10-CM | POA: Diagnosis not present

## 2022-01-26 ENCOUNTER — Encounter (HOSPITAL_COMMUNITY)
Admission: RE | Admit: 2022-01-26 | Discharge: 2022-01-26 | Disposition: A | Discharge status: Home / Self Care | Payer: Medicaid Other | Source: Ambulatory Visit | Attending: Internal Medicine | Admitting: Internal Medicine

## 2022-01-26 VITALS — Wt 311.1 lb

## 2022-01-26 DIAGNOSIS — I272 Pulmonary hypertension, unspecified: Secondary | ICD-10-CM | POA: Insufficient documentation

## 2022-01-26 DIAGNOSIS — Z5189 Encounter for other specified aftercare: Secondary | ICD-10-CM | POA: Insufficient documentation

## 2022-01-26 DIAGNOSIS — I5032 Chronic diastolic (congestive) heart failure: Secondary | ICD-10-CM | POA: Diagnosis present

## 2022-01-26 DIAGNOSIS — M5136 Other intervertebral disc degeneration, lumbar region: Secondary | ICD-10-CM | POA: Diagnosis not present

## 2022-01-26 NOTE — Progress Notes (Signed)
Daily Session Note  Patient Details  Name: Russell Collier MRN: [id number] Date of Birth: 07-06-1960 Referring Provider:   April Manson Pulmonary Rehab Walk Test from 11/25/2021 in [facility] for Heart, Vascular, & [place]  Referring Provider [name]       Encounter Date: 01/26/2022  Check In:  Session Check In - 01/26/22 1115       Check-In   Supervising physician immediately available to respond to emergencies [facility] - Physician supervision    Physician(s) Agarwala    Location [facility]    Staff Present Elmon Else, MS, ACSM-CEP, Exercise Physiologist;Samantha [place], RD, LDN;Other;Randi Yevonne Pax, ACSM-CEP, Exercise Physiologist    Virtual Visit No    Medication changes reported     No    Fall or balance concerns reported    No    Tobacco Cessation No Change    Warm-up and Cool-down Performed as group-led instruction    Resistance Training Performed Yes    VAD Patient? No    PAD/SET Patient? No      Pain Assessment   Currently in Pain? No/denies             Capillary Blood Glucose: No results found for this or any previous visit (from the past 24 hour(s)).   Exercise Prescription Changes - 01/26/22 1200       Response to Exercise   Blood Pressure (Admit) 102/54    Blood Pressure (Exercise) 103/57    Blood Pressure (Exit) 92/56    Heart Rate (Admit) 72 bpm    Heart Rate (Exercise) 82 bpm    Heart Rate (Exit) 70 bpm    Oxygen Saturation (Admit) 96 %    Oxygen Saturation (Exercise) 96 %    Oxygen Saturation (Exit) 96 %    Rating of Perceived Exertion (Exercise) 13    Perceived Dyspnea (Exercise) 1    Duration Progress to 30 minutes of  aerobic without signs/symptoms of physical distress    Intensity THRR unchanged      Progression   Progression Continue to progress workloads to maintain intensity without signs/symptoms of physical distress.      Resistance Training   Training Prescription Yes    Weight blue  bands    Reps 10-15    Time 10 Minutes      T5 Nustep   Level 4    SPM 80    Minutes 15    METs 2.4      Track   Laps 3    Minutes 10    METs 1.69             Social History   Tobacco Use  Smoking Status Every Day   Packs/day: 0.50   Years: 54.00   Total pack years: 27.00   Types: Cigarettes, Cigars   Passive exposure: Never  Smokeless Tobacco Never  Tobacco Comments   1/2 to 1 pack daily    Goals Met:  Exercise tolerated well No report of concerns or symptoms today Strength training completed today  Goals Unmet:  Not Applicable  Comments: Service time is from 1007 to 1135    Dr. Rodman Pickle is Medical Director for Pulmonary Rehab at [facility].

## 2022-01-27 DIAGNOSIS — M5136 Other intervertebral disc degeneration, lumbar region: Secondary | ICD-10-CM | POA: Diagnosis not present

## 2022-01-27 LAB — GLUCOSE, CAPILLARY: Glucose-Capillary: 90 mg/dL (ref 70–99)

## 2022-01-28 ENCOUNTER — Encounter (HOSPITAL_COMMUNITY)
Admission: RE | Admit: 2022-01-28 | Discharge: 2022-01-28 | Disposition: A | Discharge status: Home / Self Care | Payer: Medicaid Other | Source: Ambulatory Visit | Attending: Internal Medicine | Admitting: Internal Medicine

## 2022-01-28 DIAGNOSIS — M5136 Other intervertebral disc degeneration, lumbar region: Secondary | ICD-10-CM | POA: Diagnosis not present

## 2022-01-28 DIAGNOSIS — I5032 Chronic diastolic (congestive) heart failure: Secondary | ICD-10-CM | POA: Diagnosis not present

## 2022-01-28 NOTE — Progress Notes (Signed)
Daily Session Note  Patient Details  Name: Russell Collier MRN: [id number] Date of Birth: 1960/09/28 Referring Provider:   April Manson Pulmonary Rehab Walk Test from 11/25/2021 in [facility] for Heart, Vascular, & [place]  Referring Provider [name]       Encounter Date: 01/28/2022  Check In:  Session Check In - 01/28/22 1142       Check-In   Supervising physician immediately available to respond to emergencies [facility] - Physician supervision    Physician(s) Agarwala    [facility]    Staff Present Elmon Else, MS, ACSM-CEP, Exercise Physiologist;Samantha [place], RD, LDN;Other;Randi Yevonne Pax, ACSM-CEP, Exercise Physiologist    Virtual Visit No    Medication changes reported     No    Fall or balance concerns reported    No    Tobacco Cessation No Change    Warm-up and Cool-down Performed as group-led instruction    Resistance Training Performed Yes    VAD Patient? No    PAD/SET Patient? No      Pain Assessment   Currently in Pain? No/denies    Pain Score 0-No pain    Multiple Pain Sites No             Capillary Blood Glucose: No results found for this or any previous visit (from the past 24 hour(s)).    Social History   Tobacco Use  Smoking Status Every Day   Packs/day: 0.50   Years: 54.00   Total pack years: 27.00   Types: Cigarettes, Cigars   Passive exposure: Never  Smokeless Tobacco Never  Tobacco Comments   1/2 to 1 pack daily    Goals Met:  Exercise tolerated well Queuing for purse lip breathing Strength training completed today  Goals Unmet:  Not Applicable  Comments: Service time is from 1011 to 1153    Dr. Rodman Pickle is Medical Director for Pulmonary Rehab at [facility].

## 2022-01-29 DIAGNOSIS — M5136 Other intervertebral disc degeneration, lumbar region: Secondary | ICD-10-CM | POA: Diagnosis not present

## 2022-02-01 DIAGNOSIS — M5136 Other intervertebral disc degeneration, lumbar region: Secondary | ICD-10-CM | POA: Diagnosis not present

## 2022-02-02 ENCOUNTER — Encounter (HOSPITAL_COMMUNITY)
Admission: RE | Admit: 2022-02-02 | Discharge: 2022-02-02 | Disposition: A | Discharge status: Home / Self Care | Payer: Medicaid Other | Source: Ambulatory Visit | Attending: Internal Medicine | Admitting: Internal Medicine

## 2022-02-02 ENCOUNTER — Telehealth (HOSPITAL_COMMUNITY): Payer: Self-pay

## 2022-02-02 DIAGNOSIS — M5136 Other intervertebral disc degeneration, lumbar region: Secondary | ICD-10-CM | POA: Diagnosis not present

## 2022-02-02 DIAGNOSIS — I272 Pulmonary hypertension, unspecified: Secondary | ICD-10-CM

## 2022-02-02 DIAGNOSIS — I5032 Chronic diastolic (congestive) heart failure: Secondary | ICD-10-CM | POA: Diagnosis not present

## 2022-02-02 NOTE — Progress Notes (Signed)
Daily Session Note  Patient Details  Name: Russell Collier MRN: [id number] Date of Birth: Jun 28, 1960 Referring Provider:   April Manson Pulmonary Rehab Walk Test from 11/25/2021 in [facility] for Heart, Vascular, & [place]  Referring Provider [name]       Encounter Date: 02/02/2022  Check In:  Session Check In - 02/02/22 1153       Check-In   Supervising physician immediately available to respond to emergencies [facility] - Physician supervision    Physician(s) Dr. Tacy Learn    [facility]    Staff Present Elmon Else, MS, ACSM-CEP, Exercise Physiologist;Clemente Dewey Ysidro Evert, RN;Randi Olen Cordial BS, ACSM-CEP, Exercise Physiologist;Other    Virtual Visit No    Medication changes reported     No    Fall or balance concerns reported    No    Tobacco Cessation No Change    Warm-up and Cool-down Performed as group-led instruction    Resistance Training Performed Yes    VAD Patient? No    PAD/SET Patient? No      Pain Assessment   Currently in Pain? No/denies             Capillary Blood Glucose: No results found for this or any previous visit (from the past 24 hour(s)).    Social History   Tobacco Use  Smoking Status Every Day   Packs/day: 0.50   Years: 54.00   Total pack years: 27.00   Types: Cigarettes, Cigars   Passive exposure: Never  Smokeless Tobacco Never  Tobacco Comments   1/2 to 1 pack daily    Goals Met:  Proper associated with RPD/PD & O2 Sat Exercise tolerated well No report of concerns or symptoms today Strength training completed today  Goals Unmet:  Not Applicable  Comments: Service time is from [place] to [place]    Dr. Rodman Pickle is Medical Director for Pulmonary Rehab at [facility].

## 2022-02-02 NOTE — Telephone Encounter (Signed)
RN called patient's ortho MD for another aquatic referral. Dr. Cristi Loron office was called at [phone] and message left on the nurse's line. Awaiting follow up.

## 2022-02-03 DIAGNOSIS — M5136 Other intervertebral disc degeneration, lumbar region: Secondary | ICD-10-CM | POA: Diagnosis not present

## 2022-02-04 ENCOUNTER — Encounter (HOSPITAL_COMMUNITY)
Admission: RE | Admit: 2022-02-04 | Discharge: 2022-02-04 | Disposition: A | Discharge status: Home / Self Care | Payer: Medicaid Other | Source: Ambulatory Visit | Attending: Internal Medicine | Admitting: Internal Medicine

## 2022-02-04 DIAGNOSIS — I5032 Chronic diastolic (congestive) heart failure: Secondary | ICD-10-CM | POA: Diagnosis not present

## 2022-02-04 DIAGNOSIS — M21371 Foot drop, right foot: Secondary | ICD-10-CM | POA: Diagnosis not present

## 2022-02-04 DIAGNOSIS — I272 Pulmonary hypertension, unspecified: Secondary | ICD-10-CM

## 2022-02-04 DIAGNOSIS — M5136 Other intervertebral disc degeneration, lumbar region: Secondary | ICD-10-CM | POA: Diagnosis not present

## 2022-02-04 NOTE — Progress Notes (Signed)
Daily Session Note  Patient Details  Name: Russell Collier MRN: [id number] Date of Birth: 11-Jun-1960 Referring Provider:   April Collier Pulmonary Rehab Walk Test from 11/25/2021 in [facility] for Heart, Vascular, & [place]  Referring Provider Russell Collier       Encounter Date: 02/04/2022  Check In:  Session Check In - 02/04/22 1136       Check-In   Supervising physician immediately available to respond to emergencies [facility] - Physician supervision    Physician(s) Russell Collier    [facility]    Staff Present Russell Else, MS, ACSM-CEP, Exercise Physiologist;Russell Ysidro Evert, RN;Russell Collier BS, ACSM-CEP, Exercise Physiologist;Other    Virtual Visit No    Medication changes reported     No    Fall or balance concerns reported    No    Tobacco Cessation No Change    Warm-up and Cool-down Performed as group-led instruction    Resistance Training Performed Yes    VAD Patient? No    PAD/SET Patient? No      Pain Assessment   Currently in Pain? No/denies             Capillary Blood Glucose: No results found for this or any previous visit (from the past 24 hour(s)).    Social History   Tobacco Use  Smoking Status Every Day   Packs/day: 0.50   Years: 54.00   Total pack years: 27.00   Types: Cigarettes, Cigars   Passive exposure: Never  Smokeless Tobacco Never  Tobacco Comments   1/2 to 1 pack daily    Goals Met:  Exercise tolerated well No report of concerns or symptoms today Strength training completed today  Goals Unmet:  Not Applicable  Comments: Service time is from 1020 to 1210.    Russell Collier is Medical Director for Pulmonary Rehab at [facility].

## 2022-02-05 ENCOUNTER — Other Ambulatory Visit: Payer: Self-pay | Admitting: Student

## 2022-02-05 DIAGNOSIS — M5136 Other intervertebral disc degeneration, lumbar region: Secondary | ICD-10-CM | POA: Diagnosis not present

## 2022-02-08 ENCOUNTER — Ambulatory Visit (INDEPENDENT_AMBULATORY_CARE_PROVIDER_SITE_OTHER): Payer: Medicaid Other | Admitting: Podiatry

## 2022-02-08 DIAGNOSIS — M5136 Other intervertebral disc degeneration, lumbar region: Secondary | ICD-10-CM | POA: Diagnosis not present

## 2022-02-08 DIAGNOSIS — Z91199 Patient's noncompliance with other medical treatment and regimen due to unspecified reason: Secondary | ICD-10-CM

## 2022-02-09 ENCOUNTER — Other Ambulatory Visit (HOSPITAL_COMMUNITY): Payer: Self-pay

## 2022-02-09 ENCOUNTER — Encounter (HOSPITAL_COMMUNITY)
Admission: RE | Admit: 2022-02-09 | Discharge: 2022-02-09 | Disposition: A | Discharge status: Home / Self Care | Payer: Medicaid Other | Source: Ambulatory Visit | Attending: Internal Medicine | Admitting: Internal Medicine

## 2022-02-09 VITALS — Wt 313.9 lb

## 2022-02-09 DIAGNOSIS — I272 Pulmonary hypertension, unspecified: Secondary | ICD-10-CM

## 2022-02-09 DIAGNOSIS — I5032 Chronic diastolic (congestive) heart failure: Secondary | ICD-10-CM

## 2022-02-09 DIAGNOSIS — M5136 Other intervertebral disc degeneration, lumbar region: Secondary | ICD-10-CM | POA: Diagnosis not present

## 2022-02-09 NOTE — Progress Notes (Signed)
1. No-show for appointment     

## 2022-02-09 NOTE — Progress Notes (Signed)
Daily Session Note  Patient Details  Name: Russell Collier MRN: [id number] Date of Birth: 11-07-1960 Referring Provider:   April Manson Pulmonary Rehab Walk Test from 11/25/2021 in [facility] for Heart, Vascular, & [place]  Referring Provider [name]       Encounter Date: 02/09/2022  Check In:  Session Check In - 02/09/22 1135       Check-In   Supervising physician immediately available to respond to emergencies [facility] - Physician supervision    Physician(s) Dr. Ander Slade    [facility]    Staff Present Elmon Else, MS, ACSM-CEP, Exercise Physiologist;Randi Yevonne Pax, ACSM-CEP, Exercise Physiologist;Other    Virtual Visit No    Medication changes reported     No    Fall or balance concerns reported    No    Tobacco Cessation No Change    Warm-up and Cool-down Performed as group-led instruction    Resistance Training Performed Yes    VAD Patient? No    PAD/SET Patient? No      Pain Assessment   Currently in Pain? Yes    Pain Score 6     Pain Location Back    Pain Orientation Lower    Pain Descriptors / Indicators Aching    Pain Type Chronic pain    Pain Onset More than a month ago    Pain Frequency Constant    Aggravating Factors  "comes and goes"    Pain Relieving Factors advil    Effect of Pain on Daily Activities no    Multiple Pain Sites Yes    Aggravating Factors  walking      2nd Pain Site   Pain Score 6    Pain Type Chronic pain    Pain Location Knee    Pain Orientation Left;Right    Pain Descriptors / Indicators Aching    Pain Frequency Constant      Pain   Pain Onset More than a month ago      Pain Screening   Pain Relieving Factors stopping to rest    Effect of Pain on Daily Activities takes breaks when walking             Capillary Blood Glucose: No results found for this or any previous visit (from the past 24 hour(s)).   Exercise Prescription Changes - 02/09/22 1200       Response  to Exercise   Blood Pressure (Admit) 102/60    Blood Pressure (Exercise) 102/60    Blood Pressure (Exit) 102/56    Heart Rate (Admit) 81 bpm    Heart Rate (Exercise) 95 bpm    Heart Rate (Exit) 96 bpm    Oxygen Saturation (Admit) 96 %    Oxygen Saturation (Exercise) 95 %    Oxygen Saturation (Exit) 96 %    Rating of Perceived Exertion (Exercise) 15    Perceived Dyspnea (Exercise) 1    Duration Progress to 30 minutes of  aerobic without signs/symptoms of physical distress    Intensity THRR unchanged      Progression   Progression Continue to progress workloads to maintain intensity without signs/symptoms of physical distress.      Resistance Training   Training Prescription Yes    Weight blue bands    Reps 10-15    Time 10 Minutes      T5 Nustep   Level 7    SPM 80    Minutes 15    METs 2.6  Track   Laps 6    Minutes 10    METs 2.38             Social History   Tobacco Use  Smoking Status Every Day   Packs/day: 0.50   Years: 54.00   Total pack years: 27.00   Types: Cigarettes, Cigars   Passive exposure: Never  Smokeless Tobacco Never  Tobacco Comments   1/2 to 1 pack daily    Goals Met:  Independence with exercise equipment Exercise tolerated well No report of concerns or symptoms today Strength training completed today  Goals Unmet:  Not Applicable  Comments: Service time is from 1017 to 1148    Dr. Rodman Pickle is Medical Director for Pulmonary Rehab at [facility].

## 2022-02-10 DIAGNOSIS — M5136 Other intervertebral disc degeneration, lumbar region: Secondary | ICD-10-CM | POA: Diagnosis not present

## 2022-02-10 NOTE — Progress Notes (Signed)
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 including how the numbers were predicted and where they are located for reference Provide education and explanation of THRR including how the numbers were predicted and where they are located for reference    Expected Outcomes Short Term: Able to state/look up THRR;Long Term: Able to use THRR to govern intensity when exercising independently;Short Term: Able to use daily as guideline for intensity in rehab Short Term: Able to state/look up THRR;Long Term: Able to use THRR to govern intensity when exercising independently;Short Term: Able to use daily as guideline for intensity in rehab Short Term: Able to state/look up THRR;Long Term: Able to use THRR to govern intensity when  exercising independently;Short Term: Able to use daily as guideline for intensity in rehab Short Term: Able to state/look up THRR;Long Term: Able to use THRR to govern intensity when exercising independently;Short Term: Able to use daily as guideline for intensity in rehab    Understanding of Exercise Prescription Yes Yes Yes Yes    Intervention Provide education, explanation, and written materials on patient's individual exercise prescription Provide education, explanation, and written materials on patient's individual exercise prescription Provide education, explanation, and written materials on patient's individual exercise prescription Provide education, explanation, and written materials on patient's individual exercise prescription    Expected Outcomes Short Term: Able to explain program exercise prescription;Long Term: Able to explain home exercise prescription to exercise independently Short Term: Able to explain program exercise prescription;Long Term: Able to explain home exercise prescription to exercise independently Short Term: Able to explain program exercise prescription;Long Term: Able to explain home exercise prescription to exercise independently Short Term: Able to explain program exercise prescription;Long Term: Able to explain home exercise prescription to exercise independently             Exercise Goals Re-Evaluation :  Exercise Goals Re-Evaluation  [place] Name 12/07/21 7858 01/06/22 0817 02/03/22 1158         Exercise Goal Re-Evaluation   Exercise Goals Review Increase Physical Activity;Increase Strength and Stamina;Able to understand and use rate of perceived exertion (RPE) scale;Able to understand and use Dyspnea scale;Knowledge and understanding of Target Heart Rate Range (THRR);Understanding of Exercise Prescription Increase Physical Activity;Increase Strength and Stamina;Able to understand and use rate of perceived exertion (RPE) scale;Able to understand and use  Dyspnea scale;Knowledge and understanding of Target Heart Rate Range (THRR);Understanding of Exercise Prescription Increase Physical Activity;Increase Strength and Stamina;Able to understand and use rate of perceived exertion (RPE) scale;Able to understand and use Dyspnea scale;Knowledge and understanding of Target Heart Rate Range (THRR);Understanding of Exercise Prescription     Comments Semaj has completed 2 exercise sessions. He exercises on the Nustep for 25-30 min on the Nustep with rest breaks. Priscilla will exercise at a slightly higher intensity and then take a rest break. He is very motivated to exercise. [place] performs the warmup and cooldown seated/standing dependent on his fatigue. It is too soon to note any discernable progressions. Will continue to monitor and progress as able. Tarique has completed 6 exercise sessions. He exercises on the Nustep for 20 min and 10 min on the track. Suleman averages 2.7 METs at level 4 on the [place] and 1.7 METs on the track. Her performs the warmup and cooldown standing/ seated dependent on his fatigue.Keontay was progressed walking the track within the last 2 visits. He tolerates the track well as he takes rest breaks when needed. It has been difficult to progress him recently as his attendance has been lacking. Staff have called to check on him, but messages have to be left. Will continue to monitor and progress as able. Christino has completed 11 exercise sessions. He exercises for 15 min on the Nustep and 10 min on the track. Christoper averages 2.8 METs at level 5 on the Nustep and 1.77 METs on the track. He performs the warmup and cooldown mostly standing. His workload has increased on the Nustep, although METs have remained the same. Demetrious will sit down when his back is bothering him. We have recently discussed exercising outside of rehab. I do not think Akash will exercise outside of PR unless he is in another rehab program. He working on getting a referral for  aquatic therapy again. Will continue to monitor and progress as able.     Expected Outcomes Through exercise at rehab and home, the patient will decrease shortness of breath with daily activities and feel confident in carrying out an exercise regimen at home. Through exercise at rehab and home, the patient will decrease shortness of breath with daily activities and feel confident in carrying out an exercise regimen at home. Through exercise at rehab and home, the patient will decrease shortness of breath with daily activities and feel confident in carrying out an exercise regimen at home.              Discharge Exercise Prescription (Final Exercise Prescription Changes):  Exercise Prescription Changes - 02/09/22 1200       Response to Exercise   Blood Pressure (Admit) 102/60    Blood Pressure (Exercise) 102/60    Blood Pressure (Exit) 102/56    Heart Rate (Admit) 81 bpm    Heart Rate (Exercise) 95 bpm    Heart Rate (Exit) 96 bpm    Oxygen Saturation (Admit) 96 %    Oxygen Saturation (Exercise) 95 %    Oxygen  Saturation (Exit) 96 %    Rating of Perceived Exertion (Exercise) 15    Perceived Dyspnea (Exercise) 1    Duration Progress to 30 minutes of  aerobic without signs/symptoms of physical distress    Intensity THRR unchanged      Progression   Progression Continue to progress workloads to maintain intensity without signs/symptoms of physical distress.      Resistance Training   Training Prescription Yes    Weight blue bands    Reps 10-15    Time 10 Minutes      T5 Nustep   Level 7    SPM 80    Minutes 15    METs 2.6      Track   Laps 6    Minutes 10    METs 2.38             Nutrition:  Target Goals: Understanding of nutrition guidelines, daily intake of sodium <154m, cholesterol <2064m calories 30% from fat and 7% or less from saturated fats, daily to have 5 or more servings of fruits and vegetables.  Biometrics:  Pre Biometrics - 11/25/21 1104        Pre Biometrics   Grip Strength 30 kg              Nutrition Therapy Plan and Nutrition Goals:  Nutrition Therapy & Goals - 02/04/22 1130       Nutrition Therapy   Diet Heart Healthy/Carbohydrate Consistent Diet    Drug/Food Interactions Statins/Certain Fruits      Personal Nutrition Goals   Nutrition Goal Patient to use the plate method as a guide for meal planning to include lean protein/plant protein, fruit, vegetables, whole grains, and non fat dairy as part of heart healthy diet.    Personal Goal #2 Patient to reduce sodium intake to <2000-150074mer day    Personal Goal #3 Patient to identiy food sources and limit daily intake of saturated fat, trans fat, sodium, and refined carbohydrates    Comments Goals in action. RobSalomons made significant progress toward dietary changes to aid with weight loss, heart health, and sodium reduction. He has reduced fast food intake from daily to 1- 2x per week. He has introduced tuna, sardines, and vegetables regularly. He continues some sugary drinks and some high sodium, refined carbohydrates (ramen noodles, etc). We did discuss cutting back on overall sodium intake to <2000m53my and reading food labels for sodium.      Intervention Plan   Intervention Prescribe, educate and counsel regarding individualized specific dietary modifications aiming towards targeted core components such as weight, hypertension, lipid management, diabetes, heart failure and other comorbidities.;Nutrition handout(s) given to patient.    Expected Outcomes Short Term Goal: Understand basic principles of dietary content, such as calories, fat, sodium, cholesterol and nutrients.;Long Term Goal: Adherence to prescribed nutrition plan.             Nutrition Assessments:  MEDIFICTS Score Key: ?70 Need to make dietary changes  40-70 Heart Healthy Diet ? 40 Therapeutic Level Cholesterol Diet   Picture Your Plate Scores: <40 <27ealthy dietary pattern with much  room for improvement. 41-50 Dietary pattern unlikely to meet recommendations for good health and room for improvement. 51-60 More healthful dietary pattern, with some room for improvement.  >60 Healthy dietary pattern, although there may be some specific behaviors that could be improved.    Nutrition Goals Re-Evaluation:  Nutrition Goals Re-Evaluation     [place] 12/01/21 1155 12/15/21 1125 01/21/22 1121  02/04/22 1130       Goals   Current Weight 316 lb 2.2 oz (143.4 kg) 311 lb 4.6 oz (141.2 kg) 317 lb 3.9 oz (143.9 kg) 312 lb 13.3 oz (141.9 kg)    Comment A1c 6.5, he is not currently taking any diabetes medications A1c 6.5; he does report taking MVI daily A1c 6.1 No new labs at this time. Most recent labs show A1c 6.1    Expected Outcome Mr. Gustafson reports eating two meals per day and eating out often. He reports history of constipation and he does take daily stool softener for this. He lives at home with three roommates and does very little cooking. He has previously completed cardiac rehab. Julyan remains pre-contemplative toward dietary changes. He reports motivation to lose weight. However, he reports eating out twice daily everyday [place]rend[place]yn Pat[place], [place], etc) and skipping breakfast. We discussed strategies for weight loss including reducing fried foods, reducing/eliminating sugar drinks, reducing portions and increasing non-starchy vegetables. He was open to reducing portion sizes of entree (i.e. decreasing number of whoppers). He is down 3.7# since starting with our program. Goals in action. Stephone has made significant progress toward dietary changes to aid with weight loss, heart health, and sodium reduction. He has reduced fast food intake from daily to 2x per week. He has introduced tuna, sardines, and vegetables regularly. He continues to drink some sugary beverages (sweet tea); however, he continues to prioritize water. Weight loss efforts have been variable throughout pulmonary  rehab; expect weight loss with consistency of dietary changes. He is up ~2# since starting with pulmonary rehab program. Goals in action. Bricyn has made significant progress toward dietary changes to aid with weight loss, heart health, and sodium reduction. He has reduced fast food intake from daily to 1- 2x per week. He has introduced tuna, sardines, and vegetables regularly. He continues some sugary drinks and some high sodium, refined carbohydrates (ramen noodles, etc). We did discuss cutting back on overall sodium intake to <2087m/day and reading food labels for sodium. Encouraged daily weight monitoring. He is down 2.2# since starting with our program though his weight does fluctuate. RTruthwill continue to benefit from adherance to a low sodium diet recommendations and heart healthy diet choices with emphasis on vegetables and lean protein/plant protein. Finnian's support is limited as he lives with roommates; he does his own grocery shopping and cooking.             Nutrition Goals Discharge (Final Nutrition Goals Re-Evaluation):  Nutrition Goals Re-Evaluation - 02/04/22 1130       Goals   Current Weight 312 lb 13.3 oz (141.9 kg)    Comment No new labs at this time. Most recent labs show A1c 6.1    Expected Outcome Goals in action. RIsleyhas made significant progress toward dietary changes to aid with weight loss, heart health, and sodium reduction. He has reduced fast food intake from daily to 1- 2x per week. He has introduced tuna, sardines, and vegetables regularly. He continues some sugary drinks and some high sodium, refined carbohydrates (ramen noodles, etc). We did discuss cutting back on overall sodium intake to <20053mday and reading food labels for sodium. Encouraged daily weight monitoring. He is down 2.2# since starting with our program though his weight does fluctuate. RoKentrelill continue to benefit from adherance to a low sodium diet recommendations and heart healthy diet  choices with emphasis on vegetables and lean protein/plant protein. Johncharles's support is limited as he lives with  roommates; he does his own grocery shopping and cooking.             Psychosocial: Target Goals: Acknowledge presence or absence of significant depression and/or stress, maximize coping skills, provide positive support system. Participant is able to verbalize types and ability to use techniques and skills needed for reducing stress and depression.  Initial Review & Psychosocial Screening:  Initial Psych Review & Screening - 11/25/21 1224       Initial Review   Current issues with None Identified      Family Dynamics   Good Support System? Yes   sister, home health aide and 2 daughters     Barriers   Psychosocial barriers to participate in program There are no identifiable barriers or psychosocial needs.      Screening Interventions   Interventions Encouraged to exercise    Expected Outcomes Short Term goal: Utilizing psychosocial counselor, staff and physician to assist with identification of specific Stressors or current issues interfering with healing process. Setting desired goal for each stressor or current issue identified.;Long Term Goal: Stressors or current issues are controlled or eliminated.             Quality of Life Scores:  Scores of 19 and below usually indicate a poorer quality of life in these areas.  A difference of  2-3 points is a clinically meaningful difference.  A difference of 2-3 points in the total score of the Quality of Life Index has been associated with significant improvement in overall quality of life, self-image, physical symptoms, and general health in studies assessing change in quality of life.  PHQ-9: Review Flowsheet       11/25/2021 02/12/2021 12/23/2020 03/25/2020  Depression screen PHQ 2/9  Decreased Interest 0 0 0 0  Down, Depressed, Hopeless 0 0 0 0  PHQ - 2 Score 0 0 0 0  Altered sleeping 0 0 0 0  Tired, decreased  energy 1 0 0 0  Change in appetite 3 0 0 0  Feeling bad or failure about yourself  0 0 0 0  Trouble concentrating 0 0 0 0  Moving slowly or fidgety/restless 0 0 0 0  Suicidal thoughts 0 0 0 0  PHQ-9 Score 4 0 0 0  Difficult doing work/chores Not difficult at all - Not difficult at all -   Interpretation of Total Score  Total Score Depression Severity:  1-4 = Minimal depression, 5-9 = Mild depression, 10-14 = Moderate depression, 15-19 = Moderately severe depression, 20-27 = Severe depression   Psychosocial Evaluation and Intervention:  Psychosocial Evaluation - 11/25/21 1220       Psychosocial Evaluation & Interventions   Interventions Encouraged to exercise with the program and follow exercise prescription    Expected Outcomes That Erie will be able to attend the PR program without any psychosocial barriers.    Continue Psychosocial Services  No Follow up required             Psychosocial Re-Evaluation:  Psychosocial Re-Evaluation     [name] Name 12/11/21 0725 01/06/22 6144 02/09/22 0837         Psychosocial Re-Evaluation   Current issues with None Identified None Identified None Identified     Comments Dequincy has been participating in rehab free of psychosocial concerns. Mindy is still participating in rehab free of psychosocial concerns. Arif denies any psychosocial barriers. He stated that everything is good.     Expected Outcomes For Keonta to participate in reahb free of psychosocial  concerns. For Yeudiel to participate in reahb free of psychosocial concerns. Pesach will continue to attend PR without any psychosocial issues or concerns.     Interventions Encouraged to attend Pulmonary Rehabilitation for the exercise Encouraged to attend Pulmonary Rehabilitation for the exercise Encouraged to attend Pulmonary Rehabilitation for the exercise     Continue Psychosocial Services  No Follow up required No Follow up required No Follow up required               Psychosocial Discharge (Final Psychosocial Re-Evaluation):  Psychosocial Re-Evaluation - 02/09/22 0837       Psychosocial Re-Evaluation   Current issues with None Identified    Comments Gurley denies any psychosocial barriers. He stated that everything is good.    Expected Outcomes Cardale will continue to attend PR without any psychosocial issues or concerns.    Interventions Encouraged to attend Pulmonary Rehabilitation for the exercise    Continue Psychosocial Services  No Follow up required             Education: Education Goals: Education classes will be provided on a weekly basis, covering required topics. Participant will state understanding/return demonstration of topics presented.  Learning Barriers/Preferences:  Learning Barriers/Preferences - 11/25/21 1503       Learning Barriers/Preferences   Learning Barriers Exercise Concerns   Due to chronic back pain   Learning Preferences None             Education Topics: Introduction to Pulmonary Rehab Group instruction provided by PowerPoint, verbal discussion, and written material to support subject matter. Instructor reviews what Pulmonary Rehab is, the purpose of the program, and how patients are referred.     Know Your Numbers Group instruction that is supported by a PowerPoint presentation. Instructor discusses importance of knowing and understanding resting, exercise, and post-exercise oxygen saturation, heart rate, and blood pressure. Oxygen saturation, heart rate, blood pressure, rating of perceived exertion, and dyspnea are reviewed along with a normal range for these values.    Exercise for the Pulmonary Patient Group instruction that is supported by a PowerPoint presentation. Instructor discusses benefits of exercise, core components of exercise, frequency, duration, and intensity of an exercise routine, importance of utilizing pulse oximetry during exercise, safety while exercising, and options of  places to exercise outside of rehab.       MET Level  Group instruction provided by PowerPoint, verbal discussion, and written material to support subject matter. Instructor reviews what METs are and how to increase METs.    Pulmonary Medications Verbally interactive group education provided by instructor with focus on inhaled medications and proper administration.   Anatomy and Physiology of the Respiratory System Group instruction provided by PowerPoint, verbal discussion, and written material to support subject matter. Instructor reviews respiratory cycle and anatomical components of the respiratory system and their functions. Instructor also reviews differences in obstructive and restrictive respiratory diseases with examples of each.  Flowsheet Row PULMONARY REHAB OTHER RESPIRATORY from 01/21/2022 in [facility]  Date 12/03/21  Educator EP  Instruction Review Code 1- Verbalizes Understanding       Oxygen Safety Group instruction provided by PowerPoint, verbal discussion, and written material to support subject matter. There is an overview of "What is Oxygen" and "Why do we need it".  Instructor also reviews how to create a safe environment for oxygen use, the importance of using oxygen as prescribed, and the risks of noncompliance. There is a brief discussion on  traveling with oxygen and resources the patient may utilize. Flowsheet Row PULMONARY REHAB OTHER RESPIRATORY from 01/21/2022 in [facility]  Date 12/31/21  Educator Donnetta Simpers  [Handout]  Instruction Review Code 1- Verbalizes Understanding       Oxygen Use Group instruction provided by PowerPoint, verbal discussion, and written material to discuss how supplemental oxygen is prescribed and different types of oxygen supply systems. Resources for more information are provided.    Breathing Techniques Group  instruction that is supported by demonstration and informational handouts. Instructor discusses the benefits of pursed lip and diaphragmatic breathing and detailed demonstration on how to perform both.  Flowsheet Row PULMONARY REHAB OTHER RESPIRATORY from 01/21/2022 in [facility]  Date 01/21/22  Educator EP  Instruction Review Code 1- Verbalizes Understanding        Risk Factor Reduction Group instruction that is supported by a PowerPoint presentation. Instructor discusses the definition of a risk factor, different risk factors for pulmonary disease, and how the heart and lungs work together. Flowsheet Row PULMONARY REHAB OTHER RESPIRATORY from 02/04/2022 in [facility], & [place]  Date 02/04/22  Educator EP  Instruction Review Code 1- Verbalizes Understanding       MD Day A group question and answer session with a medical doctor that allows participants to ask questions that relate to their pulmonary disease state.   Nutrition for the Pulmonary Patient Group instruction provided by PowerPoint slides, verbal discussion, and written materials to support subject matter. The instructor gives an explanation and review of healthy diet recommendations, which includes a discussion on weight management, recommendations for fruit and vegetable consumption, as well as protein, fluid, caffeine, fiber, sodium, sugar, and alcohol. Tips for eating when patients are short of breath are discussed.    Other Education Group or individual verbal, written, or video instructions that support the educational goals of the pulmonary rehab program. Flowsheet Row PULMONARY REHAB OTHER RESPIRATORY from 02/04/2022 in [facility] [place]  Date 01/28/22  Educator EP  Instruction Review Code 1- Verbalizes Understanding        Knowledge Questionnaire  Score:  Knowledge Questionnaire Score - 11/25/21 1452       Knowledge Questionnaire Score   Pre Score 11/18             Core Components/Risk Factors/Patient Goals at Admission:  Personal Goals and Risk Factors at Admission - 12/03/21 1234       Core Components/Risk Factors/Patient Goals on Admission   Tobacco Cessation Yes    Number of packs per day 10 cigars qd    Intervention Assist the participant in steps to quit. Provide individualized education and counseling about committing to Tobacco Cessation, relapse prevention, and pharmacological support that can be provided by physician.;Advice worker, assist with locating and accessing local/national Quit Smoking programs, and support quit date choice.    Expected Outcomes Short Term: Will demonstrate readiness to quit, by selecting a quit date.;Short Term: Will quit all tobacco product use, adhering to prevention of relapse plan.;Long Term: Complete abstinence from all tobacco products for at least 12 months from quit date.             Core Components/Risk Factors/Patient Goals Review:   Goals and Risk Factor Review     [place] Name 12/11/21 804 [id number] 01/06/22 9622 02/09/22 0841 02/09/22 [phone]  Core Components/Risk Factors/Patient Goals Review   Personal Goals Review Weight Management/Obesity;Tobacco Cessation;Improve shortness of breath with ADL's;Develop more efficient breathing techniques such as purse lipped breathing and diaphragmatic breathing and practicing self-pacing with activity. Weight Management/Obesity;Tobacco Cessation;Improve shortness of breath with ADL's;Develop more efficient breathing techniques such as purse lipped breathing and diaphragmatic breathing and practicing self-pacing with activity. Weight Management/Obesity;Tobacco Cessation;Improve shortness of breath with ADL's;Develop more efficient breathing techniques such as purse lipped breathing and diaphragmatic breathing and practicing  self-pacing with activity.;Heart Failure --    Review Mattheu has completed 3 exercise sessions. He has increased his level on the Nustep. Staff have discussed quitting smoking with [name]. He was told staff that he was in the process of cutting down his amount of cigars. He has not set a quit date yet. Will continue to assess. Rafferty has completed 6 exercise sessions. He progressed to exercising on track for 10 min and tolerates this well. Romaine's attendance has been lacking recently. He was ill for a bit but is now not attending without calling. Staff have called Devone but have yet to talk to him. -- Assad has attended 12 sessions and looks forward to coming to class. He has also attended several educational classes on topics that have included anatomy and physiology of the respiratory system, oxygen safety, breathing techniques, stress and energy, and risk factor reductions. Zygmund has applied the breathing techniques in class while he is exercising and states that he also does this at home. Cullin has maintained his oxygen saturation in the mid-90s while exercising on room air. We have talked to Joshus extensively on smoking cessation. He is still pre-contemplating if he is ready and wants to quit smoking. Staff have suggested he set a date for January 1st, 2024, but he is not ready to fully commit. Demoni has maintained his weight and he has been working with our dietician for 6 weeks to try and lose weight. Arek's heart failure has remained stable, and he is compliant with taking his medications.    Expected Outcomes See admission goals. See admission goals. -- See admission goals             Core Components/Risk Factors/Patient Goals at Discharge (Final Review):   Goals and Risk Factor Review - 02/09/22 0842       Core Components/Risk Factors/Patient Goals Review   Review Maycol has attended 12 sessions and looks forward to coming to class. He has also attended several educational classes  on topics that have included anatomy and physiology of the respiratory system, oxygen safety, breathing techniques, stress and energy, and risk factor reductions. Cantrell has applied the breathing techniques in class while he is exercising and states that he also does this at home. Lewayne has maintained his oxygen saturation in the mid-90s while exercising on room air. We have talked to Khaalid extensively on smoking cessation. He is still pre-contemplating if he is ready and wants to quit smoking. Staff have suggested he set a date for January 1st, 2024, but he is not ready to fully commit. Marsalis has maintained his weight and he has been working with our dietician for 6 weeks to try and lose weight. Kushal's heart failure has remained stable, and he is compliant with taking his medications.    Expected Outcomes See admission goals             ITP Comments: Pt is making expected progress toward Pulmonary Rehab goals after completing 13 sessions. Recommend continued exercise, life style modification, education,  and utilization of breathing techniques to increase stamina and strength, while also decreasing shortness of breath with exertion.   Dr. Rodman Pickle is Medical Director for Pulmonary Rehab at [facility].

## 2022-02-11 ENCOUNTER — Encounter (HOSPITAL_COMMUNITY)
Admission: RE | Admit: 2022-02-11 | Discharge: 2022-02-11 | Disposition: A | Discharge status: Home / Self Care | Payer: Medicaid Other | Source: Ambulatory Visit | Attending: Internal Medicine | Admitting: Internal Medicine

## 2022-02-11 DIAGNOSIS — I5032 Chronic diastolic (congestive) heart failure: Secondary | ICD-10-CM | POA: Diagnosis not present

## 2022-02-11 DIAGNOSIS — I272 Pulmonary hypertension, unspecified: Secondary | ICD-10-CM

## 2022-02-11 DIAGNOSIS — M5136 Other intervertebral disc degeneration, lumbar region: Secondary | ICD-10-CM | POA: Diagnosis not present

## 2022-02-11 NOTE — Progress Notes (Signed)
Daily Session Note  Patient Details  Name: Russell Collier MRN: [id number] Date of Birth: [birth year] Referring Provider:   April Manson Pulmonary Rehab Walk Test from 11/25/2021 in [facility] for Heart, Vascular, & [place]  Referring Provider [name]       Encounter Date: 02/11/2022  Check In:  Session Check In - 02/11/22 1222       Check-In   Supervising physician immediately available to respond to emergencies [facility] - Physician supervision    Physician(s) Dr. Gala Murdoch    Location [facility]    Staff Present Elmon Else, MS, ACSM-CEP, Exercise Physiologist;Lisa Ysidro Evert, RN;Other    Virtual Visit No    Medication changes reported     No    Fall or balance concerns reported    No    Tobacco Cessation No Change    Warm-up and Cool-down Performed as group-led instruction    Resistance Training Performed Yes    VAD Patient? No    PAD/SET Patient? No      Pain Assessment   Currently in Pain? No/denies    Multiple Pain Sites No             Capillary Blood Glucose: No results found for this or any previous visit (from the past 24 hour(s)).    Social History   Tobacco Use  Smoking Status Every Day   Packs/day: 0.50   Years: 54.00   Total pack years: 27.00   Types: Cigarettes, Cigars   Passive exposure: Never  Smokeless Tobacco Never  Tobacco Comments   1/2 to 1 pack daily    Goals Met:  Exercise tolerated well No report of concerns or symptoms today Strength training completed today  Goals Unmet:  Not Applicable  Comments: Service time is from 1026 to 1146    Dr. Rodman Pickle is Medical Director for Pulmonary Rehab at [facility].

## 2022-02-12 DIAGNOSIS — M5136 Other intervertebral disc degeneration, lumbar region: Secondary | ICD-10-CM | POA: Diagnosis not present

## 2022-02-15 DIAGNOSIS — M5136 Other intervertebral disc degeneration, lumbar region: Secondary | ICD-10-CM | POA: Diagnosis not present

## 2022-02-16 ENCOUNTER — Other Ambulatory Visit: Payer: Self-pay

## 2022-02-16 ENCOUNTER — Encounter (HOSPITAL_COMMUNITY)
Admission: RE | Admit: 2022-02-16 | Discharge: 2022-02-16 | Disposition: A | Discharge status: Home / Self Care | Payer: Medicaid Other | Source: Ambulatory Visit | Attending: Internal Medicine | Admitting: Internal Medicine

## 2022-02-16 DIAGNOSIS — I5032 Chronic diastolic (congestive) heart failure: Secondary | ICD-10-CM | POA: Diagnosis not present

## 2022-02-16 DIAGNOSIS — M5136 Other intervertebral disc degeneration, lumbar region: Secondary | ICD-10-CM | POA: Diagnosis not present

## 2022-02-16 NOTE — Progress Notes (Signed)
Daily Session Note  Patient Details  Name: Russell Collier MRN: [id number] Date of Birth: May 19, 1960 Referring Provider:   April Manson Pulmonary Rehab Walk Test from 11/25/2021 in [facility] for Heart, Vascular, & [place]  Referring Provider [name]       Encounter Date: 02/16/2022  Check In:  Session Check In - 02/16/22 1216       Check-In   Supervising physician immediately available to respond to emergencies [facility] - Physician supervision    Physician(s) Dr. Erskine Emery    Location [facility]    Staff Present Lesly Rubenstein, MS, ACSM-CEP, CCRP, Exercise Physiologist;Artemis Koller Yevonne Pax, ACSM-CEP, Exercise Physiologist;Lisa Ysidro Evert, RN    Virtual Visit No    Medication changes reported     No    Fall or balance concerns reported    No    Tobacco Cessation No Change    Warm-up and Cool-down Performed as group-led instruction    Resistance Training Performed Yes    VAD Patient? No    PAD/SET Patient? No      Pain Assessment   Currently in Pain? No/denies    Multiple Pain Sites No             Capillary Blood Glucose: No results found for this or any previous visit (from the past 24 hour(s)).    Social History   Tobacco Use  Smoking Status Every Day   Packs/day: 0.50   Years: 54.00   Total pack years: 27.00   Types: Cigarettes, Cigars   Passive exposure: Never  Smokeless Tobacco Never  Tobacco Comments   1/2 to 1 pack daily    Goals Met:  Exercise tolerated well No report of concerns or symptoms today Strength training completed today  Goals Unmet:  Not Applicable  Comments: Service time is from 1016 to 1145.    Dr. Rodman Pickle is Medical Director for Pulmonary Rehab at [facility].

## 2022-02-17 ENCOUNTER — Encounter (HOSPITAL_COMMUNITY): Payer: Medicaid Other | Admitting: Internal Medicine

## 2022-02-17 DIAGNOSIS — M5136 Other intervertebral disc degeneration, lumbar region: Secondary | ICD-10-CM | POA: Diagnosis not present

## 2022-02-18 ENCOUNTER — Ambulatory Visit (HOSPITAL_COMMUNITY)
Admission: RE | Admit: 2022-02-18 | Discharge: 2022-02-18 | Disposition: A | Discharge status: Home / Self Care | Payer: Medicaid Other | Source: Ambulatory Visit | Attending: Internal Medicine | Admitting: Internal Medicine

## 2022-02-18 ENCOUNTER — Encounter (HOSPITAL_COMMUNITY): Payer: Self-pay | Admitting: Internal Medicine

## 2022-02-18 ENCOUNTER — Encounter (HOSPITAL_COMMUNITY)
Admission: RE | Admit: 2022-02-18 | Discharge: 2022-02-18 | Disposition: A | Discharge status: Home / Self Care | Payer: Medicaid Other | Source: Ambulatory Visit | Attending: Internal Medicine | Admitting: Internal Medicine

## 2022-02-18 VITALS — BP 104/60 | HR 77 | Wt 312.8 lb

## 2022-02-18 DIAGNOSIS — E851 Neuropathic heredofamilial amyloidosis: Secondary | ICD-10-CM | POA: Diagnosis not present

## 2022-02-18 DIAGNOSIS — I5032 Chronic diastolic (congestive) heart failure: Secondary | ICD-10-CM | POA: Diagnosis not present

## 2022-02-18 DIAGNOSIS — G63 Polyneuropathy in diseases classified elsewhere: Secondary | ICD-10-CM | POA: Diagnosis not present

## 2022-02-18 DIAGNOSIS — G4733 Obstructive sleep apnea (adult) (pediatric): Secondary | ICD-10-CM

## 2022-02-18 DIAGNOSIS — Z7901 Long term (current) use of anticoagulants: Secondary | ICD-10-CM | POA: Diagnosis not present

## 2022-02-18 DIAGNOSIS — M5136 Other intervertebral disc degeneration, lumbar region: Secondary | ICD-10-CM | POA: Diagnosis not present

## 2022-02-18 DIAGNOSIS — E118 Type 2 diabetes mellitus with unspecified complications: Secondary | ICD-10-CM | POA: Insufficient documentation

## 2022-02-18 DIAGNOSIS — R002 Palpitations: Secondary | ICD-10-CM | POA: Insufficient documentation

## 2022-02-18 DIAGNOSIS — G629 Polyneuropathy, unspecified: Secondary | ICD-10-CM | POA: Insufficient documentation

## 2022-02-18 DIAGNOSIS — Z955 Presence of coronary angioplasty implant and graft: Secondary | ICD-10-CM | POA: Insufficient documentation

## 2022-02-18 DIAGNOSIS — R42 Dizziness and giddiness: Secondary | ICD-10-CM | POA: Diagnosis present

## 2022-02-18 DIAGNOSIS — Z79899 Other long term (current) drug therapy: Secondary | ICD-10-CM | POA: Diagnosis not present

## 2022-02-18 DIAGNOSIS — E854 Organ-limited amyloidosis: Secondary | ICD-10-CM | POA: Insufficient documentation

## 2022-02-18 DIAGNOSIS — Z7902 Long term (current) use of antithrombotics/antiplatelets: Secondary | ICD-10-CM | POA: Insufficient documentation

## 2022-02-18 DIAGNOSIS — I43 Cardiomyopathy in diseases classified elsewhere: Secondary | ICD-10-CM | POA: Diagnosis not present

## 2022-02-18 DIAGNOSIS — I110 Hypertensive heart disease with heart failure: Secondary | ICD-10-CM | POA: Insufficient documentation

## 2022-02-18 DIAGNOSIS — Z86711 Personal history of pulmonary embolism: Secondary | ICD-10-CM | POA: Diagnosis not present

## 2022-02-18 DIAGNOSIS — Z86718 Personal history of other venous thrombosis and embolism: Secondary | ICD-10-CM | POA: Diagnosis not present

## 2022-02-18 DIAGNOSIS — Z720 Tobacco use: Secondary | ICD-10-CM

## 2022-02-18 DIAGNOSIS — Z716 Tobacco abuse counseling: Secondary | ICD-10-CM | POA: Insufficient documentation

## 2022-02-18 DIAGNOSIS — Z7984 Long term (current) use of oral hypoglycemic drugs: Secondary | ICD-10-CM | POA: Insufficient documentation

## 2022-02-18 DIAGNOSIS — Z6841 Body Mass Index (BMI) 40.0 and over, adult: Secondary | ICD-10-CM | POA: Diagnosis not present

## 2022-02-18 DIAGNOSIS — F17210 Nicotine dependence, cigarettes, uncomplicated: Secondary | ICD-10-CM | POA: Diagnosis not present

## 2022-02-18 DIAGNOSIS — I2510 Atherosclerotic heart disease of native coronary artery without angina pectoris: Secondary | ICD-10-CM | POA: Diagnosis not present

## 2022-02-18 LAB — BASIC METABOLIC PANEL
Anion gap: 7 (ref 5–15)
BUN: 30 mg/dL — ABNORMAL HIGH (ref 8–23)
CO2: 24 mmol/L (ref 22–32)
Calcium: 9 mg/dL (ref 8.9–10.3)
Chloride: 106 mmol/L (ref 98–111)
Creatinine, Ser: 1.68 mg/dL — ABNORMAL HIGH (ref 0.61–1.24)
GFR, Estimated: 45 mL/min — ABNORMAL LOW (ref >60)
Glucose, Bld: 123 mg/dL — ABNORMAL HIGH (ref 70–99)
Potassium: 4.6 mmol/L (ref 3.5–5.1)
Sodium: 137 mmol/L (ref 135–145)

## 2022-02-18 LAB — BRAIN NATRIURETIC PEPTIDE: B Natriuretic Peptide: 21.5 pg/mL (ref 0.0–100.0)

## 2022-02-18 MED ORDER — ENALAPRIL MALEATE 10 MG PO TABS: 10.0000 mg | ORAL_TABLET | Freq: Every day | ORAL | 3 refills | Status: DC

## 2022-02-18 NOTE — Patient Instructions (Signed)
STOP Imdur.  DECREASE Enalapril to '10mg'$  nightly.  Labs done today, your results will be available in MyChart, we will contact you for abnormal readings.  Your physician has requested that you have an echocardiogram. Echocardiography is a painless test that uses sound waves to create images of your heart. It provides your doctor with information about the size and shape of your heart and how well your heart's chambers and valves are working. This procedure takes approximately one hour. There are no restrictions for this procedure. Please do NOT wear cologne, perfume, aftershave, or lotions (deodorant is allowed). Please arrive 15 minutes prior to your appointment time.  Your physician recommends that you schedule a follow-up appointment in: 4 months with an echocardiogram ( April 2024)  ** please call the office in February to arrange your follow up appointment **  If you have any questions or concerns before your next appointment please send [place] a message through [place] or call our office at [phone].    TO LEAVE A MESSAGE FOR THE NURSE SELECT OPTION 2, PLEASE LEAVE A MESSAGE INCLUDING: YOUR NAME DATE OF BIRTH CALL BACK NUMBER REASON FOR CALL**this is important as we prioritize the call backs  YOU WILL RECEIVE A CALL BACK THE SAME DAY AS LONG AS YOU CALL BEFORE 4:00 PM  At the [facility], you and your health needs are our priority. As part of our continuing mission to provide you with exceptional heart care, we have created designated Provider Care Teams. These Care Teams include your primary Cardiologist (physician) and Advanced Practice Providers (APPs- Physician Assistants and Nurse Practitioners) who all work together to provide you with the care you need, when you need it.   You may see any of the following providers on your designated Care Team at your next follow up: Dr Glori Bickers Dr Loralie Champagne Dr. Roxana Hires, NP Lyda Jester,  [facility], [place] Forestine Na, NP Audry Riles, PharmD   Please be sure to bring in all your medications bottles to every appointment.

## 2022-02-18 NOTE — Progress Notes (Signed)
Daily Session Note  Patient Details  Name: Russell Collier MRN: [id number] Date of Birth: Jun 23, 1960 Referring Provider:   April Manson Pulmonary Rehab Walk Test from 11/25/2021 in [facility] for Heart, Vascular, & [place]  Referring Provider [name]       Encounter Date: 02/18/2022  Check In:  Session Check In - 02/18/22 1616       Check-In   Supervising physician immediately available to respond to emergencies [facility] - Physician supervision    Physician(s) Dr. Erskine Emery    Location [facility]    Staff Present Rodney Langton, RN;Randi Yevonne Pax, ACSM-CEP, Exercise Physiologist;Carlette Wilber Oliphant, RN, BSN;Jetta Walker BS, ACSM-CEP, Exercise Physiologist    Virtual Visit No    Medication changes reported     No    Fall or balance concerns reported    No    Tobacco Cessation No Change    Warm-up and Cool-down Performed as group-led instruction    Resistance Training Performed Yes    VAD Patient? No    PAD/SET Patient? No      Pain Assessment   Currently in Pain? Yes    Pain Score 3     Pain Location Knee    Pain Orientation Right    Pain Type Chronic pain             Capillary Blood Glucose: Results for orders placed or performed during the hospital encounter of 02/18/22 (from the past 24 hour(s))  Basic Metabolic Panel (BMET)     Status: Abnormal   Collection Time: 02/18/22 11:22 AM  Result Value Ref Range   Sodium 137 135 - 145 mmol/L   Potassium 4.6 3.5 - 5.1 mmol/L   Chloride 106 98 - 111 mmol/L   CO2 24 22 - 32 mmol/L   Glucose, Bld 123 (H) 70 - 99 mg/dL   BUN 30 (H) 8 - 23 mg/dL   Creatinine, Ser 1.68 (H) 0.61 - 1.24 mg/dL   Calcium 9.0 8.9 - 10.3 mg/dL   GFR, Estimated 45 (L) >60 mL/min   Anion gap 7 5 - 15  B Nat Peptide     Status: None   Collection Time: 02/18/22 11:22 AM  Result Value Ref Range   B Natriuretic Peptide 21.5 0.0 - 100.0 pg/mL      Social History   Tobacco Use  Smoking Status Every Day    Packs/day: 0.50   Years: 54.00   Total pack years: 27.00   Types: Cigarettes, Cigars   Passive exposure: Never  Smokeless Tobacco Never  Tobacco Comments   1/2 to 1 pack daily    Goals Met:  Proper associated with RPD/PD & O2 Sat Exercise tolerated well No report of concerns or symptoms today Strength training completed today  Goals Unmet:  Not Applicable  Comments: Service time is from 1325 to [place]     Dr. Rodman Pickle is Medical Director for Pulmonary Rehab at [facility].

## 2022-02-18 NOTE — Addendum Note (Signed)
Encounter addended by: Jerl Mina, RN on: 02/18/2022 11:40 AM  Actions taken: Medication long-term status modified, Pharmacy for encounter modified, Order list changed, Diagnosis association updated, Clinical Note Signed, Charge Capture section accepted
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
## 2022-02-19 ENCOUNTER — Telehealth (HOSPITAL_COMMUNITY): Payer: Self-pay

## 2022-02-19 ENCOUNTER — Other Ambulatory Visit (HOSPITAL_COMMUNITY): Payer: Self-pay

## 2022-02-19 DIAGNOSIS — M5136 Other intervertebral disc degeneration, lumbar region: Secondary | ICD-10-CM | POA: Diagnosis not present

## 2022-02-19 DIAGNOSIS — I5032 Chronic diastolic (congestive) heart failure: Secondary | ICD-10-CM

## 2022-02-19 NOTE — Telephone Encounter (Signed)
Patient aware and agreeable. Labs ordered and scheduled. 

## 2022-02-22 DIAGNOSIS — M5136 Other intervertebral disc degeneration, lumbar region: Secondary | ICD-10-CM | POA: Diagnosis not present

## 2022-02-23 ENCOUNTER — Encounter (HOSPITAL_COMMUNITY)
Admission: RE | Admit: 2022-02-23 | Discharge: 2022-02-23 | Disposition: A | Discharge status: Home / Self Care | Payer: Medicaid Other | Source: Ambulatory Visit | Attending: Internal Medicine | Admitting: Internal Medicine

## 2022-02-23 DIAGNOSIS — I5032 Chronic diastolic (congestive) heart failure: Secondary | ICD-10-CM | POA: Diagnosis not present

## 2022-02-23 DIAGNOSIS — M5136 Other intervertebral disc degeneration, lumbar region: Secondary | ICD-10-CM | POA: Diagnosis not present

## 2022-02-24 ENCOUNTER — Other Ambulatory Visit: Payer: Self-pay | Admitting: Student

## 2022-02-24 DIAGNOSIS — M5136 Other intervertebral disc degeneration, lumbar region: Secondary | ICD-10-CM | POA: Diagnosis not present

## 2022-02-24 NOTE — Progress Notes (Signed)
Discharge Progress Report  Patient Details  Name: Russell Collier MRN: [id number] Date of Birth: 1961-02-19 Referring Provider:   April Manson Pulmonary Rehab Walk Test from 11/25/2021 in [facility] [place]  Referring Provider [name]        Number of Visits: 17  Reason for Discharge:  Patient has met program and personal goals.  Smoking History:  Social History   Tobacco Use  Smoking Status Every Day   Packs/day: 0.50   Years: 54.00   Total pack years: 27.00   Types: Cigarettes, Cigars   Passive exposure: Never  Smokeless Tobacco Never  Tobacco Comments   1/2 to 1 pack daily    Diagnosis:  Heart failure, diastolic, chronic ([place])  ADL UCSD:  Pulmonary Assessment Scores     Row Name 11/25/21 1208 11/25/21 1452       ADL UCSD   ADL Phase Entry Entry    SOB Score total -- 50      CAT Score   CAT Score -- 12      mMRC Score   mMRC Score 3 3             Initial Exercise Prescription:  Initial Exercise Prescription - 11/25/21 1200       Date of Initial Exercise RX and Referring Provider   Date 11/25/21    Referring Provider [name]    Expected Discharge Date 01/28/22      NuStep   Level 1    SPM 85    Minutes 20    METs 2      Prescription Details   Frequency (times per week) 2    Duration Progress to 30 minutes of continuous aerobic without signs/symptoms of physical distress      Intensity   THRR 40-80% of Max Heartrate 63-126    Ratings of Perceived Exertion 11-13    Perceived Dyspnea 0-4      Progression   Progression Continue progressive overload as per policy without signs/symptoms or physical distress.      Resistance Training   Training Prescription Yes    Weight blue bands    Reps 10-15             Discharge Exercise Prescription (Final Exercise Prescription Changes):  Exercise Prescription Changes - 02/09/22 1200       Response to Exercise   Blood Pressure  (Admit) 102/60    Blood Pressure (Exercise) 102/60    Blood Pressure (Exit) 102/56    Heart Rate (Admit) 81 bpm    Heart Rate (Exercise) 95 bpm    Heart Rate (Exit) 96 bpm    Oxygen Saturation (Admit) 96 %    Oxygen Saturation (Exercise) 95 %    Oxygen Saturation (Exit) 96 %    Rating of Perceived Exertion (Exercise) 15    Perceived Dyspnea (Exercise) 1    Duration Progress to 30 minutes of  aerobic without signs/symptoms of physical distress    Intensity THRR unchanged      Progression   Progression Continue to progress workloads to maintain intensity without signs/symptoms of physical distress.      Resistance Training   Training Prescription Yes    Weight blue bands    Reps 10-15    Time 10 Minutes      T5 Nustep   Level 7    SPM 80    Minutes 15    METs 2.6      Track  Laps 6    Minutes 10    METs 2.38             Functional Capacity:  6 Minute Walk     Row Name 11/25/21 1203 02/23/22 1626       6 Minute Walk   Phase Initial Discharge    Distance 955 feet 1160 feet    Distance % Change -- 21.47 %    Distance Feet Change -- 205 ft    Walk Time 6 minutes 6 minutes    # of Rest Breaks 1  3:17-4:22 1  3:33-4:12    MPH 1.81 2.2    METS 1.99 2.17    RPE 13 13    Perceived Dyspnea  1 1    VO2 Peak 6.98 7.6    Symptoms Yes (comment) No    Comments Back pain 7/10 --    Resting HR 94 bpm 79 bpm    Resting BP 120/68 120/70    Resting Oxygen Saturation  97 % 96 %    Exercise Oxygen Saturation  during 6 min walk 96 % 95 %    Max Ex. HR 114 bpm 111 bpm    Max Ex. BP 162/74 140/68    2 Minute Post BP 138/70 118/66      Interval HR   1 Minute HR 94 103    2 Minute HR 101 108    3 Minute HR 108 111    4 Minute HR 104 108    5 Minute HR 102 105    6 Minute HR 114 110    2 Minute Post HR 78 94    Interval Heart Rate? Yes Yes      Interval Oxygen   Interval Oxygen? Yes --    Baseline Oxygen Saturation % 97 % 96 %    1 Minute Oxygen Saturation % 96 %  95 %    1 Minute Liters of Oxygen 0 L 0 L    2 Minute Oxygen Saturation % 96 % 97 %    2 Minute Liters of Oxygen 0 L 0 L    3 Minute Oxygen Saturation % 96 % 97 %    3 Minute Liters of Oxygen 0 L 0 L    4 Minute Oxygen Saturation % 97 % 98 %    4 Minute Liters of Oxygen 0 L 0 L    5 Minute Oxygen Saturation % 98 % 98 %    5 Minute Liters of Oxygen 0 L 0 L    6 Minute Oxygen Saturation % 97 % 97 %    6 Minute Liters of Oxygen 0 L 0 L    2 Minute Post Oxygen Saturation % 98 % 98 %    2 Minute Post Liters of Oxygen 0 L 0 L             Psychological, QOL, Others - Outcomes: PHQ 2/9:    02/23/2022    4:32 PM 11/25/2021   11:30 AM 02/12/2021    2:53 PM 12/23/2020   10:17 AM  Depression screen PHQ 2/9  Decreased Interest 0 0 0 0  Down, Depressed, Hopeless 0 0 0 0  PHQ - 2 Score 0 0 0 0  Altered sleeping 0 0 0 0  Tired, decreased energy 0 1 0 0  Change in appetite 0 3 0 0  Feeling bad or failure about yourself  0 0 0 0  Trouble concentrating 0 0 0 0  Moving slowly or fidgety/restless 0 0 0 0  Suicidal thoughts 0 0 0 0  PHQ-9 Score 0 4 0 0  Difficult doing work/chores Not difficult at all Not difficult at all  Not difficult at all    Quality of Life:   Personal Goals: Goals established at orientation with interventions provided to work toward goal.  Personal Goals and Risk Factors at Admission - 12/03/21 1234       Core Components/Risk Factors/Patient Goals on Admission   Tobacco Cessation Yes    Number of packs per day 10 cigars qd    Intervention Assist the participant in steps to quit. Provide individualized education and counseling about committing to Tobacco Cessation, relapse prevention, and pharmacological support that can be provided by physician.;Advice worker, assist with locating and accessing local/national Quit Smoking programs, and support quit date choice.    Expected Outcomes Short Term: Will demonstrate readiness to quit, by selecting a quit  date.;Short Term: Will quit all tobacco product use, adhering to prevention of relapse plan.;Long Term: Complete abstinence from all tobacco products for at least 12 months from quit date.              Personal Goals Discharge:  Goals and Risk Factor Review     Row Name 12/11/21 0726 01/06/22 0824 02/09/22 0841 02/09/22 0842       Core Components/Risk Factors/Patient Goals Review   Personal Goals Review Weight Management/Obesity;Tobacco Cessation;Improve shortness of breath with ADL's;Develop more efficient breathing techniques such as purse lipped breathing and diaphragmatic breathing and practicing self-pacing with activity. Weight Management/Obesity;Tobacco Cessation;Improve shortness of breath with ADL's;Develop more efficient breathing techniques such as purse lipped breathing and diaphragmatic breathing and practicing self-pacing with activity. Weight Management/Obesity;Tobacco Cessation;Improve shortness of breath with ADL's;Develop more efficient breathing techniques such as purse lipped breathing and diaphragmatic breathing and practicing self-pacing with activity.;Heart Failure --    Review Brogen has completed 3 exercise sessions. He has increased his level on the Nustep. Staff have discussed quitting smoking with [name]. He was told staff that he was in the process of cutting down his amount of cigars. He has not set a quit date yet. Will continue to assess. Josuha has completed 6 exercise sessions. He progressed to exercising on track for 10 min and tolerates this well. Edgard's attendance has been lacking recently. He was ill for a bit but is now not attending without calling. Staff have called Samer but have yet to talk to him. -- Teddy has attended 12 sessions and looks forward to coming to class. He has also attended several educational classes on topics that have included anatomy and physiology of the respiratory system, oxygen safety, breathing techniques, stress and energy, and  risk factor reductions. Macy has applied the breathing techniques in class while he is exercising and states that he also does this at home. Ford has maintained his oxygen saturation in the mid-90s while exercising on room air. We have talked to Kyren extensively on smoking cessation. He is still pre-contemplating if he is ready and wants to quit smoking. Staff have suggested he set a date for January 1st, 2024, but he is not ready to fully commit. Cline has maintained his weight and he has been working with our dietician for 6 weeks to try and lose weight. Javarious's heart failure has remained stable, and he is compliant with taking his medications.    Expected Outcomes See admission goals. See admission goals. -- See admission goals  Exercise Goals and Review:  Exercise Goals     Row Name 11/25/21 1202 12/07/21 0823 01/06/22 0817 02/03/22 1156       Exercise Goals   Increase Physical Activity Yes Yes Yes Yes    Intervention Provide advice, education, support and counseling about physical activity/exercise needs.;Develop an individualized exercise prescription for aerobic and resistive training based on initial evaluation findings, risk stratification, comorbidities and participant's personal goals. Provide advice, education, support and counseling about physical activity/exercise needs.;Develop an individualized exercise prescription for aerobic and resistive training based on initial evaluation findings, risk stratification, comorbidities and participant's personal goals. Provide advice, education, support and counseling about physical activity/exercise needs.;Develop an individualized exercise prescription for aerobic and resistive training based on initial evaluation findings, risk stratification, comorbidities and participant's personal goals. Provide advice, education, support and counseling about physical activity/exercise needs.;Develop an individualized exercise prescription  for aerobic and resistive training based on initial evaluation findings, risk stratification, comorbidities and participant's personal goals.    Expected Outcomes Short Term: Attend rehab on a regular basis to increase amount of physical activity.;Long Term: Add in home exercise to make exercise part of routine and to increase amount of physical activity.;Long Term: Exercising regularly at least 3-5 days a week. Short Term: Attend rehab on a regular basis to increase amount of physical activity.;Long Term: Add in home exercise to make exercise part of routine and to increase amount of physical activity.;Long Term: Exercising regularly at least 3-5 days a week. Short Term: Attend rehab on a regular basis to increase amount of physical activity.;Long Term: Add in home exercise to make exercise part of routine and to increase amount of physical activity.;Long Term: Exercising regularly at least 3-5 days a week. Short Term: Attend rehab on a regular basis to increase amount of physical activity.;Long Term: Add in home exercise to make exercise part of routine and to increase amount of physical activity.;Long Term: Exercising regularly at least 3-5 days a week.    Increase Strength and Stamina Yes Yes Yes Yes    Intervention Provide advice, education, support and counseling about physical activity/exercise needs.;Develop an individualized exercise prescription for aerobic and resistive training based on initial evaluation findings, risk stratification, comorbidities and participant's personal goals. Provide advice, education, support and counseling about physical activity/exercise needs.;Develop an individualized exercise prescription for aerobic and resistive training based on initial evaluation findings, risk stratification, comorbidities and participant's personal goals. Provide advice, education, support and counseling about physical activity/exercise needs.;Develop an individualized exercise prescription for  aerobic and resistive training based on initial evaluation findings, risk stratification, comorbidities and participant's personal goals. Provide advice, education, support and counseling about physical activity/exercise needs.;Develop an individualized exercise prescription for aerobic and resistive training based on initial evaluation findings, risk stratification, comorbidities and participant's personal goals.    Expected Outcomes Short Term: Increase workloads from initial exercise prescription for resistance, speed, and METs.;Short Term: Perform resistance training exercises routinely during rehab and add in resistance training at home;Long Term: Improve cardiorespiratory fitness, muscular endurance and strength as measured by increased METs and functional capacity (6MWT) Short Term: Increase workloads from initial exercise prescription for resistance, speed, and METs.;Short Term: Perform resistance training exercises routinely during rehab and add in resistance training at home;Long Term: Improve cardiorespiratory fitness, muscular endurance and strength as measured by increased METs and functional capacity (6MWT) Short Term: Increase workloads from initial exercise prescription for resistance, speed, and METs.;Short Term: Perform resistance training exercises routinely during rehab and add in resistance training at home;Long Term:  Improve cardiorespiratory fitness, muscular endurance and strength as measured by increased METs and functional capacity (6MWT) Short Term: Increase workloads from initial exercise prescription for resistance, speed, and METs.;Short Term: Perform resistance training exercises routinely during rehab and add in resistance training at home;Long Term: Improve cardiorespiratory fitness, muscular endurance and strength as measured by increased METs and functional capacity (6MWT)    Able to understand and use rate of perceived exertion (RPE) scale Yes Yes Yes Yes    Intervention  Provide education and explanation on how to use RPE scale Provide education and explanation on how to use RPE scale Provide education and explanation on how to use RPE scale Provide education and explanation on how to use RPE scale    Expected Outcomes Short Term: Able to use RPE daily in rehab to express subjective intensity level;Long Term:  Able to use RPE to guide intensity level when exercising independently Short Term: Able to use RPE daily in rehab to express subjective intensity level;Long Term:  Able to use RPE to guide intensity level when exercising independently Short Term: Able to use RPE daily in rehab to express subjective intensity level;Long Term:  Able to use RPE to guide intensity level when exercising independently Short Term: Able to use RPE daily in rehab to express subjective intensity level;Long Term:  Able to use RPE to guide intensity level when exercising independently    Knowledge and understanding of Target Heart Rate Range (THRR) Yes Yes Yes Yes    Intervention Provide education and explanation of THRR including how the numbers were predicted and where they are located for reference Provide education and explanation of THRR including how the numbers were predicted and where they are located for reference Provide education and explanation of THRR including how the numbers were predicted and where they are located for reference Provide education and explanation of THRR including how the numbers were predicted and where they are located for reference    Expected Outcomes Short Term: Able to state/look up THRR;Long Term: Able to use THRR to govern intensity when exercising independently;Short Term: Able to use daily as guideline for intensity in rehab Short Term: Able to state/look up THRR;Long Term: Able to use THRR to govern intensity when exercising independently;Short Term: Able to use daily as guideline for intensity in rehab Short Term: Able to state/look up THRR;Long Term: Able  to use THRR to govern intensity when exercising independently;Short Term: Able to use daily as guideline for intensity in rehab Short Term: Able to state/look up THRR;Long Term: Able to use THRR to govern intensity when exercising independently;Short Term: Able to use daily as guideline for intensity in rehab    Understanding of Exercise Prescription Yes Yes Yes Yes    Intervention Provide education, explanation, and written materials on patient's individual exercise prescription Provide education, explanation, and written materials on patient's individual exercise prescription Provide education, explanation, and written materials on patient's individual exercise prescription Provide education, explanation, and written materials on patient's individual exercise prescription    Expected Outcomes Short Term: Able to explain program exercise prescription;Long Term: Able to explain home exercise prescription to exercise independently Short Term: Able to explain program exercise prescription;Long Term: Able to explain home exercise prescription to exercise independently Short Term: Able to explain program exercise prescription;Long Term: Able to explain home exercise prescription to exercise independently Short Term: Able to explain program exercise prescription;Long Term: Able to explain home exercise prescription to exercise independently  Exercise Goals Re-Evaluation:  Exercise Goals Re-Evaluation     Row Name 12/07/21 0102 01/06/22 0817 02/03/22 1158         Exercise Goal Re-Evaluation   Exercise Goals Review Increase Physical Activity;Increase Strength and Stamina;Able to understand and use rate of perceived exertion (RPE) scale;Able to understand and use Dyspnea scale;Knowledge and understanding of Target Heart Rate Range (THRR);Understanding of Exercise Prescription Increase Physical Activity;Increase Strength and Stamina;Able to understand and use rate of perceived exertion (RPE)  scale;Able to understand and use Dyspnea scale;Knowledge and understanding of Target Heart Rate Range (THRR);Understanding of Exercise Prescription Increase Physical Activity;Increase Strength and Stamina;Able to understand and use rate of perceived exertion (RPE) scale;Able to understand and use Dyspnea scale;Knowledge and understanding of Target Heart Rate Range (THRR);Understanding of Exercise Prescription     Comments Braelin has completed 2 exercise sessions. He exercises on the Nustep for 25-30 min on the Nustep with rest breaks. Lamere will exercise at a slightly higher intensity and then take a rest break. He is very motivated to exercise. Tauno performs the warmup and cooldown seated/standing dependent on his fatigue. It is too soon to note any discernable progressions. Will continue to monitor and progress as able. Joren has completed 6 exercise sessions. He exercises on the Nustep for 20 min and 10 min on the track. Sun averages 2.7 METs at level 4 on the N[place]o and 1.7 METs on the track. Her performs the warmup and cooldown standing/ seated dependent on his fatigue.Raistlin was progressed walking the track within the last 2 visits. He tolerates the track well as he takes rest breaks when needed. It has been difficult to progress him recently as his attendance has been lacking. Staff have called to check on him, but messages have to be left. Will continue to monitor and progress as able. [place] has completed 11 exercise sessions. He exercises for 15 min on the Nustep and 10 min on the track. Randall averages 2.8 METs at level 5 on the Nustep and 1.77 METs on the track. He performs the warmup and cooldown mostly standing. His workload has increased on the Nustep, although METs have remained the same. Jayant will sit down when his back is bothering him. We have recently discussed exercising outside of rehab. I do not think Moise will exercise outside of [facility] unless he is in another rehab program. He  working on getting a referral for aquatic therapy again. Will continue to monitor and progress as able.     Expected Outcomes Through exercise at rehab and home, the patient will decrease shortness of breath with daily activities and feel confident in carrying out an exercise regimen at home. Through exercise at rehab and home, the patient will decrease shortness of breath with daily activities and feel confident in carrying out an exercise regimen at home. Through exercise at rehab and home, the patient will decrease shortness of breath with daily activities and feel confident in carrying out an exercise regimen at home.              Nutrition & Weight - Outcomes:  Pre Biometrics - 11/25/21 1104       Pre Biometrics   Grip Strength 30 kg              Nutrition:  Nutrition Therapy & Goals - 02/04/22 1130       Nutrition Therapy   Diet Heart Healthy/Carbohydrate Consistent Diet    Drug/Food Interactions Statins/Certain Fruits      Personal Nutrition  Goals   Nutrition Goal Patient to use the plate method as a guide for meal planning to include lean protein/plant protein, fruit, vegetables, whole grains, and non fat dairy as part of heart healthy diet.    Personal Goal #2 Patient to reduce sodium intake to <2000-159m per day    Personal Goal #3 Patient to identiy food sources and limit daily intake of saturated fat, trans fat, sodium, and refined carbohydrates    Comments Goals in action. RDemetreshas made significant progress toward dietary changes to aid with weight loss, heart health, and sodium reduction. He has reduced fast food intake from daily to 1- 2x per week. He has introduced tuna, sardines, and vegetables regularly. He continues some sugary drinks and some high sodium, refined carbohydrates (ramen noodles, etc). We did discuss cutting back on overall sodium intake to <20043mday and reading food labels for sodium.      Intervention Plan   Intervention Prescribe,  educate and counsel regarding individualized specific dietary modifications aiming towards targeted core components such as weight, hypertension, lipid management, diabetes, heart failure and other comorbidities.;Nutrition handout(s) given to patient.    Expected Outcomes Short Term Goal: Understand basic principles of dietary content, such as calories, fat, sodium, cholesterol and nutrients.;Long Term Goal: Adherence to prescribed nutrition plan.             Nutrition Discharge:   Education Questionnaire Score:  Knowledge Questionnaire Score - 11/25/21 1452       Knowledge Questionnaire Score   Pre Score 11/18             Goals reviewed with patient; copy given to patient.

## 2022-02-25 ENCOUNTER — Ambulatory Visit (INDEPENDENT_AMBULATORY_CARE_PROVIDER_SITE_OTHER): Payer: Medicaid Other | Admitting: Podiatry

## 2022-02-25 ENCOUNTER — Other Ambulatory Visit (HOSPITAL_COMMUNITY): Payer: Self-pay

## 2022-02-25 ENCOUNTER — Encounter: Payer: Self-pay | Admitting: Podiatry

## 2022-02-25 DIAGNOSIS — M79675 Pain in left toe(s): Secondary | ICD-10-CM

## 2022-02-25 DIAGNOSIS — E1151 Type 2 diabetes mellitus with diabetic peripheral angiopathy without gangrene: Secondary | ICD-10-CM | POA: Diagnosis not present

## 2022-02-25 DIAGNOSIS — M79674 Pain in right toe(s): Secondary | ICD-10-CM | POA: Diagnosis not present

## 2022-02-25 DIAGNOSIS — B351 Tinea unguium: Secondary | ICD-10-CM | POA: Diagnosis not present

## 2022-02-25 DIAGNOSIS — L600 Ingrowing nail: Secondary | ICD-10-CM | POA: Diagnosis not present

## 2022-02-25 DIAGNOSIS — M5136 Other intervertebral disc degeneration, lumbar region: Secondary | ICD-10-CM | POA: Diagnosis not present

## 2022-02-25 NOTE — Patient Instructions (Signed)
Soak Instructions    THE DAY AFTER THE PROCEDURE  Place 1/4 cup of epsom salts (or betadine, or white vinegar) in a quart of warm tap water.  Submerge your foot or feet with outer bandage intact for the initial soak; this will allow the bandage to become moist and wet for easy lift off.  Once you remove your bandage, continue to soak in the solution for 20 minutes.  This soak should be done twice a day.  Next, remove your foot or feet from solution, blot dry the affected area and cover.  You may use a band aid large enough to cover the area or use gauze and tape.  Apply other medications to the area as directed by the doctor such as polysporin neosporin.  IF YOUR SKIN BECOMES IRRITATED WHILE USING THESE INSTRUCTIONS, IT IS OKAY TO SWITCH TO  WHITE VINEGAR AND WATER. Or you may use antibacterial soap and water to keep the toe clean  Monitor for any signs/symptoms of infection. Call the office immediately if any occur or go directly to the emergency room. Call with any questions/concerns.    Long Term Care Instructions-Post Nail Surgery  You have had your ingrown toenail and root treated with a chemical.  This chemical causes a burn that will drain and ooze like a blister.  This can drain for 6-8 weeks or longer.  It is important to keep this area clean, covered, and follow the soaking instructions dispensed at the time of your surgery.  This area will eventually dry and form a scab.  Once the scab forms you no longer need to soak or apply a dressing.  If at any time you experience an increase in pain, redness, swelling, or drainage, you should contact the office as soon as possible.  
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
## 2022-02-26 ENCOUNTER — Other Ambulatory Visit (HOSPITAL_COMMUNITY): Payer: Self-pay

## 2022-02-26 DIAGNOSIS — M5136 Other intervertebral disc degeneration, lumbar region: Secondary | ICD-10-CM | POA: Diagnosis not present

## 2022-03-01 ENCOUNTER — Telehealth: Payer: Self-pay | Admitting: Pharmacist

## 2022-03-01 DIAGNOSIS — M5136 Other intervertebral disc degeneration, lumbar region: Secondary | ICD-10-CM | POA: Diagnosis not present

## 2022-03-01 NOTE — Telephone Encounter (Signed)
Patient contacted for follow/up of tobacco intake reduction / cessation attempt.   Since last contact patient reports no progress - continues to smoke 10-12 cigs per day.   Medications currently being used;  None.  Continues to rates IMPORTANCE of quitting tobacco as high, however has made little progress.  Rates CONFIDENCE of cutting tobacco use from 10-12 to  6 cigs per day as 7 on 1-10 scale.  Most common triggers to use tobacco include; health stressors, anxiety   Motivation to quit: health / breathing. Patient is participating in a Managed Medicaid Plan:  Yes  Total time with patient call and documentation of interaction: 8 minutes. Follow-up phone call planned: 1 month

## 2022-03-01 NOTE — Telephone Encounter (Signed)
-----   Message from Leavy Cella, [place] sent at 12/09/2021 12:04 PM EDT ----- Regarding: Progress from ~ 12 cigs per day

## 2022-03-02 DIAGNOSIS — M5136 Other intervertebral disc degeneration, lumbar region: Secondary | ICD-10-CM | POA: Diagnosis not present

## 2022-03-03 DIAGNOSIS — M5136 Other intervertebral disc degeneration, lumbar region: Secondary | ICD-10-CM | POA: Diagnosis not present

## 2022-03-04 ENCOUNTER — Ambulatory Visit (HOSPITAL_COMMUNITY)
Admission: RE | Admit: 2022-03-04 | Discharge: 2022-03-04 | Disposition: A | Discharge status: Home / Self Care | Payer: Medicaid Other | Source: Ambulatory Visit | Attending: Cardiology | Admitting: Cardiology

## 2022-03-04 DIAGNOSIS — M5136 Other intervertebral disc degeneration, lumbar region: Secondary | ICD-10-CM | POA: Diagnosis not present

## 2022-03-04 DIAGNOSIS — I5032 Chronic diastolic (congestive) heart failure: Secondary | ICD-10-CM | POA: Diagnosis present

## 2022-03-04 LAB — BASIC METABOLIC PANEL
Anion gap: 9 (ref 5–15)
BUN: 17 mg/dL (ref 8–23)
CO2: 25 mmol/L (ref 22–32)
Calcium: 8.6 mg/dL — ABNORMAL LOW (ref 8.9–10.3)
Chloride: 104 mmol/L (ref 98–111)
Creatinine, Ser: 1.42 mg/dL — ABNORMAL HIGH (ref 0.61–1.24)
GFR, Estimated: 56 mL/min — ABNORMAL LOW (ref >60)
Glucose, Bld: 135 mg/dL — ABNORMAL HIGH (ref 70–99)
Potassium: 4.1 mmol/L (ref 3.5–5.1)
Sodium: 138 mmol/L (ref 135–145)

## 2022-03-05 DIAGNOSIS — G8929 Other chronic pain: Secondary | ICD-10-CM | POA: Diagnosis not present

## 2022-03-05 DIAGNOSIS — M545 Low back pain, unspecified: Secondary | ICD-10-CM | POA: Diagnosis not present

## 2022-03-05 DIAGNOSIS — M5136 Other intervertebral disc degeneration, lumbar region: Secondary | ICD-10-CM | POA: Diagnosis not present

## 2022-03-05 DIAGNOSIS — Z981 Arthrodesis status: Secondary | ICD-10-CM | POA: Diagnosis not present

## 2022-03-06 ENCOUNTER — Other Ambulatory Visit: Payer: Self-pay | Admitting: Student

## 2022-03-08 ENCOUNTER — Ambulatory Visit (INDEPENDENT_AMBULATORY_CARE_PROVIDER_SITE_OTHER): Payer: Medicaid Other | Admitting: Student

## 2022-03-08 ENCOUNTER — Encounter: Payer: Self-pay | Admitting: Student

## 2022-03-08 VITALS — BP 104/62 | HR 64 | Ht 70.0 in | Wt 311.4 lb

## 2022-03-08 DIAGNOSIS — N393 Stress incontinence (female) (male): Secondary | ICD-10-CM | POA: Diagnosis not present

## 2022-03-08 DIAGNOSIS — L309 Dermatitis, unspecified: Secondary | ICD-10-CM | POA: Diagnosis not present

## 2022-03-08 DIAGNOSIS — M5136 Other intervertebral disc degeneration, lumbar region: Secondary | ICD-10-CM | POA: Diagnosis not present

## 2022-03-08 DIAGNOSIS — L03032 Cellulitis of left toe: Secondary | ICD-10-CM | POA: Diagnosis not present

## 2022-03-08 MED ORDER — CEPHALEXIN 500 MG PO CAPS: 500.0000 mg | ORAL_CAPSULE | Freq: Two times a day (BID) | ORAL | 0 refills | Status: AC

## 2022-03-08 MED ORDER — BETAMETHASONE VALERATE 0.1 % EX OINT: 1.0000 | TOPICAL_OINTMENT | Freq: Two times a day (BID) | CUTANEOUS | 0 refills | Status: DC

## 2022-03-08 NOTE — Progress Notes (Signed)
    SUBJECTIVE:   CHIEF COMPLAINT / HPI:   Patient is a 62 year old male presenting today for concerns of urinary incontinence Per patient symptoms started about a year now He reports intermittent urinary incontinence with certain movement, coughing or sneezing No constipation. Reports chronic lower back pain but no saddle paresthesia or overt LE weakness. Report history prostate surgery back in 1993 He is currently on Lasix for HF  Medical history includes T2DM, prior lumbar fusion and chronic back pain.   Eczema  Reports continued eczema BLE and right buttocks Endorses compliance with his triamcinolone cream Since improvement with itchiness but reports rash is about the same or slightly worse. Patient expressed desire for referral to dermatology given this is a continued issue.  Left great toe pain Patient recently had procedure for ingrowing toenail removal.  Procedure was  done 2 weeks ago Not on any antibiotics Reports continued pain, redness and noticed pus on dressing Subjective fever but described as night chills.   PERTINENT  PMH / PSH: Reviewed   OBJECTIVE:   BP 104/62   Pulse 64   Ht '5\' 10"'$  (1.778 m)   Wt (!) 311 lb 6.4 oz (141.3 kg)   SpO2 98%   BMI 44.68 kg/m    Physical Exam General: Alert, well appearing, NAD Cardiovascular: RRR, No Murmurs, Normal S2/S2 Respiratory: CTAB, No wheezing or Rales Extremities: No edema on extremities   Skin: erythematous scaly rash om RLE, LLE and right buttock Left toe: Without nail and exposed skin with chronic erythema and tenderness      ASSESSMENT/PLAN:    Urinary incontinence Patient's history and presentation consistent with stress incontinence.  He does have multiple disposing factors which include history of transurethral resection of prostate, on Lasix for HF and type 2 diabetes.  Unclear cause of his incontinence at this time. -Placed ambulatory referral to urology for further evaluation -Advised  patient on bladder training until his appointment with urology  Left toe wound Patient with recent ingrowing toenail removal procedure-year-old presenting with pain, surrounding erythema and questionable pus on dressing for possible cellulitis. -Rx Keflex 500 mg twice daily for 7 days -Encourage patient to follow-up with post procedure appointment  Eczema Patient with  reported  pruritic rash on exam found to have erythematous scaly rash suspicious of Atopic dermatitis on his RLE, LLE and right buttocks. Currently using Triamcinolone 0.1% with only significant improvement.  - Recommend continued use of emollient, Vaseline or Aquafor -Apply 0.1% betamethasone ointment no more than 7 days at a time -Placed referral to dermatology    Alen Bleacher, MD [place]

## 2022-03-08 NOTE — Patient Instructions (Addendum)
It was wonderful to meet you today. Thank you for allowing me to be a part of your care. Below is a short summary of what we discussed at your visit today:  I have sent in prescription for Keflex which you will take 2 times daily for 7 days for your left toe infection.  For your eczema I have sent in prescription for betamethasone or you treatment which is a stronger steroid for the skin.  I also recommend that you continue using Vaseline or Aquaphor to keep your skin moisturized.  In addition I have sent in referral for dermatology you should look out for a call from them to schedule an appointment.  For your urinary incontinence I have sent in referral to urologist.  Please bring all of your medications to every appointment!  If you have any questions or concerns, please do not hesitate to contact us via phone or MyChart message.   Alen Bleacher, MD [facility]

## 2022-03-09 DIAGNOSIS — M5136 Other intervertebral disc degeneration, lumbar region: Secondary | ICD-10-CM | POA: Diagnosis not present

## 2022-03-09 NOTE — Progress Notes (Incomplete)
***  In Progress***    Advanced Heart Failure Clinic Note   PCP: Alen Bleacher, MD PCP-Cardiologist: Fransico Him, MD  Neurology: Dr Posey Pronto HF Cardiology: Dr. Haroldine Laws  HPI:   Mr Lever is a 62 y.o. 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    Echo 01/2018, EF 55-60%, nonobstructive cardiac cath 01/2018 (20% prox LAD, 60% mid Cx, 50% post atrio lesion) h/o bradycardia w/ ? 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    PYP scan 09/2018 and was an equivocal study. No visual increase in PYP uptake, but ratio was between 1-1.5. Based clinical suspicion, he was ordered to undergo genetic testing and results + for TTR gene.   He was seen by neurology, neuropathy associated with TTR.    Admitted 07/27/19 with atypical chest pain and wheezing. Troponins were low and flat, EKG reassuring. A CTA was performed, which showed a possible segmental PE. Since he does have a history of PE, he was continued on IV heparin and transitioned to Eliquis on 07/28/19. Echo 07/27/19 EF 55% RV normal.    At his HF follow up 12/2019 Eliquis was decreased due to frequent nose bleeds, mobility limited by back and knee pain.   S/p L3-S1 decompression 03/2020, uneventful hospitalization at [facility].   Graduated from paramedicine 09/2020.   Follow up with Neurology 03/2021, recommended starting Amvuttra and recommending restarting PT.   Echo 04/2021 EF 50-55% RV ok    Admitted 5/18-22/23 for RLE cellulitis. S/p RLE angiography with CO2, no intervention. New wound on LLE, and underwent LLE angiography with balloon angioplasty of left peroneal artery. Had LLE wound which is now healed.    At HF follow up 01/2022 Imdur discontinued and  enalapril decreased to 10 mg nightly due to orthostasis.  Today he returns to HF clinic for injection of Amvuttra. Overall feeling *** today. No contraindications to injection.  Assessment/Plan: 1. Familial TTR Cardiac Amyloidosis:  - PYP scan 09/2018 was equivocal. + genetic testing. Has TTR gene, heterozygous.  - He is on tafamadis and referred for family genetic testing. No family members with TTR.  - Based on PYP scan not thought to have extensive cardiac involvement at this point.  - He has numerous neuropathic symptoms. Followed by Dr. Narda Amber in Neurology for neuropathy - Continue Tafamidis. 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 Patient tolerated injection well. Provided patient counseling on Amvuttra. Most common side effects are injection site reactions, arthralgias, dyspnea and vitamin A deficiency. Patient is aware to return to clinic every 3 months for repeat injection.   - Continue vitamin A supplement 8000 IU daily. Amvuttra decreases serum vitamin A levels.   Follow up 3 months for repeat Amvuttra injection.  Audry Riles, PharmD, BCPS, BCCP, CPP Heart Failure Clinic Pharmacist [phone]

## 2022-03-10 ENCOUNTER — Other Ambulatory Visit (HOSPITAL_COMMUNITY): Payer: Self-pay

## 2022-03-10 ENCOUNTER — Ambulatory Visit (HOSPITAL_COMMUNITY)
Admission: RE | Admit: 2022-03-10 | Discharge: 2022-03-10 | Disposition: A | Discharge status: Home / Self Care | Payer: Medicaid Other | Source: Ambulatory Visit | Attending: Cardiology | Admitting: Cardiology

## 2022-03-10 DIAGNOSIS — Z87891 Personal history of nicotine dependence: Secondary | ICD-10-CM | POA: Insufficient documentation

## 2022-03-10 DIAGNOSIS — G4733 Obstructive sleep apnea (adult) (pediatric): Secondary | ICD-10-CM | POA: Diagnosis not present

## 2022-03-10 DIAGNOSIS — E854 Organ-limited amyloidosis: Secondary | ICD-10-CM | POA: Insufficient documentation

## 2022-03-10 DIAGNOSIS — Z6841 Body Mass Index (BMI) 40.0 and over, adult: Secondary | ICD-10-CM | POA: Insufficient documentation

## 2022-03-10 DIAGNOSIS — G63 Polyneuropathy in diseases classified elsewhere: Secondary | ICD-10-CM | POA: Diagnosis not present

## 2022-03-10 DIAGNOSIS — E851 Neuropathic heredofamilial amyloidosis: Secondary | ICD-10-CM | POA: Diagnosis not present

## 2022-03-10 DIAGNOSIS — I110 Hypertensive heart disease with heart failure: Secondary | ICD-10-CM | POA: Insufficient documentation

## 2022-03-10 DIAGNOSIS — Z7901 Long term (current) use of anticoagulants: Secondary | ICD-10-CM | POA: Insufficient documentation

## 2022-03-10 DIAGNOSIS — Z86711 Personal history of pulmonary embolism: Secondary | ICD-10-CM | POA: Insufficient documentation

## 2022-03-10 DIAGNOSIS — G629 Polyneuropathy, unspecified: Secondary | ICD-10-CM | POA: Insufficient documentation

## 2022-03-10 DIAGNOSIS — I5030 Unspecified diastolic (congestive) heart failure: Secondary | ICD-10-CM | POA: Insufficient documentation

## 2022-03-10 DIAGNOSIS — M5136 Other intervertebral disc degeneration, lumbar region: Secondary | ICD-10-CM | POA: Diagnosis not present

## 2022-03-10 MED ORDER — VUTRISIRAN SODIUM 25 MG/0.5ML ~~LOC~~ SOSY
25.0000 mg | PREFILLED_SYRINGE | Freq: Once | SUBCUTANEOUS | Status: AC
  Administered 2022-03-10: 25 mg via SUBCUTANEOUS

## 2022-03-10 NOTE — Progress Notes (Signed)
  Advanced Heart Failure Clinic Note   PCP: Alen Bleacher, MD PCP-Cardiologist: Fransico Him, MD  Neurology: Dr Posey Pronto HF Cardiology: Dr. Haroldine Laws  HPI:   [name] is a 62 y.o. 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    Echo 01/2018, EF 55-60%, nonobstructive cardiac cath 01/2018 (20% prox LAD, 60% mid Cx, 50% post atrio lesion) h/o bradycardia w/ ? 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    PYP scan 09/2018 and was an equivocal study. No visual increase in PYP uptake, but ratio was between 1-1.5. Based clinical suspicion, he was ordered to undergo genetic testing and results + for TTR gene.   He was seen by neurology, neuropathy associated with TTR.    Admitted 07/27/19 with atypical chest pain and wheezing. Troponins were low and flat, EKG reassuring. A CTA was performed, which showed a possible segmental PE. Since he does have a history of PE, he was continued on IV heparin and transitioned to Eliquis on 07/28/19. Echo 07/27/19 EF 55% RV normal.    At his HF follow up 12/2019 Eliquis was decreased due to frequent nose bleeds, mobility limited by back and knee pain.   S/p L3-S1 decompression 03/2020, uneventful hospitalization at [facility].   Graduated from paramedicine 09/2020.   Follow up with Neurology 03/2021, recommended starting Amvuttra and recommending restarting PT.   Echo 04/2021 EF 50-55% RV ok    Admitted 5/18-22/23 for RLE cellulitis. S/p RLE angiography with CO2, no intervention. New wound on LLE, and underwent LLE angiography with balloon angioplasty of left peroneal artery. Had LLE wound which is now healed.    At HF follow up 01/2022 Imdur discontinued and enalapril decreased to  10 mg nightly due to orthostasis.  Today he returns to HF clinic for injection of Amvuttra. Overall feeling well today. No contraindications to injection. Injection administered in L arm.   Assessment/Plan: 1. Familial TTR Cardiac Amyloidosis:  - PYP scan 09/2018 was equivocal. + genetic testing. Has TTR gene, heterozygous.  - He is on tafamadis and referred for family genetic testing. No family members with TTR.  - Based on PYP scan not thought to have extensive cardiac involvement at this point.  - He has numerous neuropathic symptoms. Followed by Dr. Narda Amber in Neurology for neuropathy - Continue Tafamidis. 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 Patient tolerated injection well. Provided patient counseling on Amvuttra. Most common side effects are injection site reactions, arthralgias, dyspnea and vitamin A deficiency. Patient is aware to return to clinic every 3 months for repeat injection.   - Continue vitamin A supplement 8000 IU daily. Amvuttra decreases serum vitamin A levels.   Follow up 3 months for repeat Amvuttra injection.  Audry Riles, PharmD, BCPS, BCCP, CPP Heart Failure Clinic Pharmacist [phone]

## 2022-03-11 ENCOUNTER — Encounter: Payer: Self-pay | Admitting: Podiatry

## 2022-03-11 ENCOUNTER — Ambulatory Visit (INDEPENDENT_AMBULATORY_CARE_PROVIDER_SITE_OTHER): Payer: Medicaid Other | Admitting: Podiatry

## 2022-03-11 DIAGNOSIS — M5136 Other intervertebral disc degeneration, lumbar region: Secondary | ICD-10-CM | POA: Diagnosis not present

## 2022-03-11 DIAGNOSIS — L600 Ingrowing nail: Secondary | ICD-10-CM
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
## 2022-03-12 DIAGNOSIS — M5136 Other intervertebral disc degeneration, lumbar region: Secondary | ICD-10-CM | POA: Diagnosis not present

## 2022-03-15 ENCOUNTER — Other Ambulatory Visit (HOSPITAL_COMMUNITY): Payer: Self-pay

## 2022-03-15 DIAGNOSIS — M5136 Other intervertebral disc degeneration, lumbar region: Secondary | ICD-10-CM | POA: Diagnosis not present

## 2022-03-16 DIAGNOSIS — M5136 Other intervertebral disc degeneration, lumbar region: Secondary | ICD-10-CM | POA: Diagnosis not present

## 2022-03-17 DIAGNOSIS — M5136 Other intervertebral disc degeneration, lumbar region: Secondary | ICD-10-CM | POA: Diagnosis not present

## 2022-03-18 ENCOUNTER — Encounter (HOSPITAL_COMMUNITY): Payer: Self-pay

## 2022-03-18 ENCOUNTER — Emergency Department (HOSPITAL_COMMUNITY)
Admission: EM | Admit: 2022-03-18 | Discharge: 2022-03-18 | Disposition: A | Discharge status: Home / Self Care | Payer: Medicaid Other | Attending: Emergency Medicine | Admitting: Emergency Medicine

## 2022-03-18 ENCOUNTER — Emergency Department (HOSPITAL_COMMUNITY): Payer: Medicaid Other

## 2022-03-18 DIAGNOSIS — I509 Heart failure, unspecified: Secondary | ICD-10-CM | POA: Diagnosis not present

## 2022-03-18 DIAGNOSIS — Z7902 Long term (current) use of antithrombotics/antiplatelets: Secondary | ICD-10-CM | POA: Insufficient documentation

## 2022-03-18 DIAGNOSIS — I110 Hypertensive heart disease with heart failure: Secondary | ICD-10-CM | POA: Diagnosis not present

## 2022-03-18 DIAGNOSIS — L03032 Cellulitis of left toe: Secondary | ICD-10-CM | POA: Diagnosis not present

## 2022-03-18 DIAGNOSIS — Z7901 Long term (current) use of anticoagulants: Secondary | ICD-10-CM | POA: Insufficient documentation

## 2022-03-18 DIAGNOSIS — M79675 Pain in left toe(s): Secondary | ICD-10-CM | POA: Diagnosis present

## 2022-03-18 DIAGNOSIS — M5136 Other intervertebral disc degeneration, lumbar region: Secondary | ICD-10-CM | POA: Diagnosis not present

## 2022-03-18 DIAGNOSIS — L089 Local infection of the skin and subcutaneous tissue, unspecified: Secondary | ICD-10-CM

## 2022-03-18 DIAGNOSIS — R600 Localized edema: Secondary | ICD-10-CM | POA: Diagnosis not present

## 2022-03-18 DIAGNOSIS — M79676 Pain in unspecified toe(s): Secondary | ICD-10-CM | POA: Diagnosis not present

## 2022-03-18 LAB — BASIC METABOLIC PANEL
Anion gap: 8 (ref 5–15)
BUN: 17 mg/dL (ref 8–23)
CO2: 22 mmol/L (ref 22–32)
Calcium: 9 mg/dL (ref 8.9–10.3)
Chloride: 106 mmol/L (ref 98–111)
Creatinine, Ser: 1.18 mg/dL (ref 0.61–1.24)
GFR, Estimated: >60 mL/min (ref >60)
Glucose, Bld: 118 mg/dL — ABNORMAL HIGH (ref 70–99)
Potassium: 3.8 mmol/L (ref 3.5–5.1)
Sodium: 136 mmol/L (ref 135–145)

## 2022-03-18 LAB — CBC WITH DIFFERENTIAL/PLATELET
Abs Immature Granulocytes: 0.02 10*3/uL (ref 0.00–0.07)
Basophils Absolute: 0 10*3/uL (ref 0.0–0.1)
Basophils Relative: 0 %
Eosinophils Absolute: 0.2 10*3/uL (ref 0.0–0.5)
Eosinophils Relative: 2 %
HCT: 41.8 % (ref 39.0–52.0)
Hemoglobin: 13.6 g/dL (ref 13.0–17.0)
Immature Granulocytes: 0 %
Lymphocytes Relative: 23 %
Lymphs Abs: 2.2 10*3/uL (ref 0.7–4.0)
MCH: 30 pg (ref 26.0–34.0)
MCHC: 32.5 g/dL (ref 30.0–36.0)
MCV: 92.1 fL (ref 80.0–100.0)
Monocytes Absolute: 1 10*3/uL (ref 0.1–1.0)
Monocytes Relative: 10 %
Neutro Abs: 6 10*3/uL (ref 1.7–7.7)
Neutrophils Relative %: 65 %
Platelets: 218 10*3/uL (ref 150–400)
RBC: 4.54 MIL/uL (ref 4.22–5.81)
RDW: 16.7 % — ABNORMAL HIGH (ref 11.5–15.5)
WBC: 9.4 10*3/uL (ref 4.0–10.5)
nRBC: 0 % (ref 0.0–0.2)

## 2022-03-18 MED ORDER — CEPHALEXIN 250 MG PO CAPS
500.0000 mg | ORAL_CAPSULE | Freq: Once | ORAL | Status: AC
  Administered 2022-03-18: 500 mg via ORAL
  Filled 2022-03-18: qty 2

## 2022-03-18 MED ORDER — CEPHALEXIN 500 MG PO CAPS: 500.0000 mg | ORAL_CAPSULE | Freq: Two times a day (BID) | ORAL | 0 refills | Status: AC

## 2022-03-18 NOTE — ED Triage Notes (Signed)
Pt comes from home via [facility], had L big toe removed two weeks ago, went to see PCP and then was placed on antibiotics and then today felt like that pain was worse.

## 2022-03-18 NOTE — ED Provider Triage Note (Signed)
Emergency Medicine Provider Triage Evaluation Note  Russell Collier , a 62 y.o. male  was evaluated in triage.  Pt complains of large big toe infection.  Been on his left foot for about 3 weeks, had the toenail removed recently.  Finished Keflex on Monday but despite that he still having shooting pain and feels like it is still infected.  No fevers..  Review of Systems  Per HPI  Physical Exam  There were no vitals taken for this visit. Gen:   Awake, no distress   Resp:  Normal effort  MSK:   Moves extremities without difficulty  Other:  Dp pt 2+   Medical Decision Making  Medically screening exam initiated at 3:33 AM.  Appropriate orders placed.  Russell Collier was informed that the remainder of the evaluation will be completed by another provider, this initial triage assessment does not replace that evaluation, and the importance of remaining in the ED until their evaluation is complete.     Sherrill Raring, PA-C 03/18/22 [phone]

## 2022-03-18 NOTE — ED Provider Notes (Signed)
[facility] Provider Note   CSN: [id number] Arrival date & time: 03/18/22  8768     History  Chief Complaint  Patient presents with   Toe Pain    Russell Collier is a 62 y.o. male.  HPI Patient presents with concern of toe pain.  He had removal of the toe and the matrix about 3 weeks ago.  Since that time he has been following wound care instructions including salt soaks, taking antibiotics.  He went to urgent care last week due to purulent drainage was started on Keflex.  He completed that medicine 2 days ago.  He notes that over these past 2 days he has had an increase in the purulent drainage, without systemic complaints, fever or other changes.    Home Medications Prior to Admission medications   Medication Sig Start Date End Date Taking? Authorizing Provider  cephALEXin (KEFLEX) 500 MG capsule Take 1 capsule (500 mg total) by mouth 2 (two) times daily for 7 days. 03/18/22 03/25/22 Yes Carmin Muskrat, MD  acetaminophen (TYLENOL) 325 MG tablet Take 2 tablets (650 mg total) by mouth every 6 (six) hours as needed for mild pain or moderate pain. 07/13/21   Dameron, Luna Fuse, DO  albuterol (PROVENTIL) (2.5 MG/3ML) 0.083% nebulizer solution Take 3 mLs (2.5 mg total) by nebulization every 6 (six) hours as needed for wheezing or shortness of breath. 11/20/20   Concepcion Living, MD  ANORO ELLIPTA 62.5-25 MCG/ACT AEPB INHALE 1 PUFF INTO THE LUNGS DAILY AS NEEDED. 11/23/21   Alen Bleacher, MD  betamethasone valerate ointment (VALISONE) 0.1 % Apply 1 Application topically 2 (two) times daily. 03/08/22   Alen Bleacher, MD  clopidogrel (PLAVIX) 75 MG tablet Take 1 tablet (75 mg total) by mouth daily. 09/07/21 09/07/22  Waynetta Sandy, MD  ELIQUIS 2.5 MG TABS tablet TAKE 1 TABLET (2.5 MG TOTAL) BY MOUTH 2 (TWO) TIMES DAILY. 10/23/21   Alen Bleacher, MD  enalapril (VASOTEC) 10 MG tablet Take 1 tablet (10 mg total) by mouth at bedtime. 02/18/22    [name], [name], MD  FARXIGA 10 MG TABS tablet TAKE 1 TABLET (10 MG TOTAL) BY MOUTH DAILY BEFORE BREAKFAST. 06/16/21   Cresenzo, Angelyn Punt, MD  fluticasone (FLONASE) 50 MCG/ACT nasal spray PLACE 1 SPRAY INTO BOTH NOSTRILS DAILY. 11/02/21   Alen Bleacher, MD  furosemide (LASIX) 40 MG tablet TAKE 1 TABLET (40 MG TOTAL) BY MOUTH 2 (TWO) TIMES DAILY. 02/24/22   Alen Bleacher, MD  ibuprofen (ADVIL) 200 MG tablet Take 600 mg by mouth every 6 (six) hours as needed for headache or moderate pain.    [provider]  lidocaine (LIDODERM) 5 % Place 1 patch onto the skin daily. Remove & Discard patch within 12 hours or as directed by MD 12/01/21   Randal Buba, April, MD  multivitamin (ONE-A-DAY MEN'S) TABS tablet Take 1 tablet by mouth daily with breakfast.    [provider]  pantoprazole (PROTONIX) 40 MG tablet TAKE 1 TABLET (40 MG TOTAL) BY MOUTH DAILY. 02/05/22   Alen Bleacher, MD  potassium chloride SA (KLOR-CON M) 20 MEQ tablet TAKE 2 TABLETS (40 MEQ TOTAL) BY MOUTH DAILY. 01/20/22   [name], MD  pregabalin (LYRICA) 100 MG capsule Take 1 capsule (100 mg total) by mouth 3 (three) times daily. 09/03/21   Alen Bleacher, MD  rosuvastatin (CRESTOR) 10 MG tablet Take 1 tablet (10 mg total) by mouth daily. 09/07/21 09/07/22  Waynetta Sandy, MD  Skin Protectants, Misc. (MINERIN CREME) CREA Apply on affected area twice daily 01/19/22   Alen Bleacher, MD  Tafamidis (VYNDAMAX) 61 MG CAPS TAKE 1 CAPSULE BY MOUTH DAILY. 11/19/21 11/19/22  [name], [name], MD  triamcinolone ointment (KENALOG) 0.5 % Apply 1 Application topically 2 (two) times daily. 01/18/22   Alen Bleacher, MD  VENTOLIN HFA 108 [phone] Base) MCG/ACT inhaler INHALE 2 PUFFS INTO THE LUNGS EVERY 6 (SIX) HOURS AS NEEDED FOR WHEEZING OR SHORTNESS OF BREATH. 03/08/22   Alen Bleacher, MD  Vitamin A 2400 MCG (8000 UT) TABS Take 1 tablet by mouth daily. 05/21/21   [name], [name], MD  vutrisiran sodium (AMVUTTRA) 25 MG/0.5ML syringe  Inject 0.5 mLs (25 mg total) into the skin every 3 (three) months. 05/01/21   [name], [name], MD      Allergies    Chantix [varenicline], Bupropion, Bactrim [sulfamethoxazole-trimethoprim], and Nicoderm [nicotine]    Review of Systems   Review of Systems  All other systems reviewed and are negative.   Physical Exam Updated Vital Signs BP 116/73 (BP Location: Right Arm)   Pulse 83   Temp 98.1 F (36.7 C)   Resp 20   SpO2 97%  Physical Exam Vitals and nursing note reviewed.  Constitutional:      General: He is not in acute distress.    Appearance: He is well-developed.  HENT:     Head: Normocephalic and atraumatic.  Eyes:     Conjunctiva/sclera: Conjunctivae normal.  Cardiovascular:     Rate and Rhythm: Normal rate and regular rhythm.     Pulses: Normal pulses.  Pulmonary:     Effort: Pulmonary effort is normal. No respiratory distress.     Breath sounds: No stridor.  Abdominal:     General: There is no distension.  Musculoskeletal:       Legs:  Skin:    General: Skin is warm and dry.  Neurological:     Mental Status: He is alert and oriented to person, place, and time.     ED Results / Procedures / Treatments   Labs (all labs ordered are listed, but only abnormal results are displayed) Labs Reviewed  BASIC METABOLIC PANEL - Abnormal; Notable for the following components:      Result Value   Glucose, Bld 118 (*)    All other components within normal limits  CBC WITH DIFFERENTIAL/PLATELET - Abnormal; Notable for the following components:   RDW 16.7 (*)    All other components within normal limits    EKG None  Radiology DG Foot Complete Left  Result Date: 03/18/2022 CLINICAL DATA:  Pain in the left great toe. EXAM: LEFT FOOT - COMPLETE 3+ VIEW COMPARISON:  None Available. FINDINGS: There is mild-to-moderate generalized edema. Normal bone mineralization. No evidence of fracture, dislocation or erosive arthropathy. There are mild features of midfoot  arthrosis. The forefoot joint spaces are maintained. Mild hallux valgus. No aggressive destructive pathologic process. There is no visible foreign body or soft tissue gas. IMPRESSION: Generalized soft tissue edema without evidence of fracture, dislocation or erosive arthropathy. Mild midfoot arthrosis and mild hallux valgus. Electronically Signed   By: Telford Nab M.D.   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  On exam he is awake, alert, has no evidence for systemic illness, labs reviewed, discussed, reassuring, similarly no evidence for systemic illness. X-ray without evidence for osteomyelitis.  Some suspicion for infection, inflammation, particular given the patient's removal of the germinal matrix is a consideration.  Patient discharged after initiation of antibiotics, with plan for outpatient podiatry follow-up.  Amount and/or Complexity of Data Reviewed External Data Reviewed: notes.    Details: Podiatry notes from last checkup visit last week reviewed Labs:  Decision-making details documented in ED Course. Radiology: independent interpretation performed. Decision-making details documented in ED Course.  Risk Prescription drug management. Decision regarding hospitalization.           Final Clinical Impression(s) / ED Diagnoses Final diagnoses:  Toe infection    Rx / DC Orders ED Discharge Orders          Ordered    cephALEXin (KEFLEX) 500 MG capsule  2 times daily        03/18/22 1007              Carmin Muskrat, MD 03/18/22 1013

## 2022-03-18 NOTE — Discharge Instructions (Signed)
As discussed, today's evaluation did not demonstrate evidence that you have a systemic infection, nor infection of the bone.  Please continue the care instructions provided to you by your podiatry team.  Follow-up with those individuals soon as possible for repeat evaluation.

## 2022-03-19 DIAGNOSIS — M5136 Other intervertebral disc degeneration, lumbar region: Secondary | ICD-10-CM | POA: Diagnosis not present

## 2022-03-22 DIAGNOSIS — M5136 Other intervertebral disc degeneration, lumbar region: Secondary | ICD-10-CM | POA: Diagnosis not present

## 2022-03-23 DIAGNOSIS — M5136 Other intervertebral disc degeneration, lumbar region: Secondary | ICD-10-CM | POA: Diagnosis not present

## 2022-03-24 DIAGNOSIS — M5136 Other intervertebral disc degeneration, lumbar region: Secondary | ICD-10-CM | POA: Diagnosis not present

## 2022-03-25 ENCOUNTER — Encounter: Payer: Self-pay | Admitting: Podiatry

## 2022-03-25 ENCOUNTER — Ambulatory Visit (INDEPENDENT_AMBULATORY_CARE_PROVIDER_SITE_OTHER): Payer: Medicaid Other | Admitting: Podiatry

## 2022-03-25 DIAGNOSIS — M5136 Other intervertebral disc degeneration, lumbar region: Secondary | ICD-10-CM | POA: Diagnosis not present

## 2022-03-25 DIAGNOSIS — L600 Ingrowing nail: Secondary | ICD-10-CM

## 2022-03-25 DIAGNOSIS — E1151 Type 2 diabetes mellitus with diabetic peripheral angiopathy without gangrene: Secondary | ICD-10-CM
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
## 2022-03-26 ENCOUNTER — Other Ambulatory Visit (HOSPITAL_COMMUNITY): Payer: Self-pay

## 2022-03-26 DIAGNOSIS — M5136 Other intervertebral disc degeneration, lumbar region: Secondary | ICD-10-CM | POA: Diagnosis not present

## 2022-03-29 DIAGNOSIS — M5136 Other intervertebral disc degeneration, lumbar region: Secondary | ICD-10-CM | POA: Diagnosis not present

## 2022-03-30 DIAGNOSIS — M5136 Other intervertebral disc degeneration, lumbar region: Secondary | ICD-10-CM | POA: Diagnosis not present

## 2022-03-31 DIAGNOSIS — M5136 Other intervertebral disc degeneration, lumbar region: Secondary | ICD-10-CM | POA: Diagnosis not present

## 2022-04-01 DIAGNOSIS — M5136 Other intervertebral disc degeneration, lumbar region: Secondary | ICD-10-CM | POA: Diagnosis not present

## 2022-04-02 DIAGNOSIS — M5136 Other intervertebral disc degeneration, lumbar region: Secondary | ICD-10-CM | POA: Diagnosis not present

## 2022-04-05 DIAGNOSIS — M5136 Other intervertebral disc degeneration, lumbar region: Secondary | ICD-10-CM | POA: Diagnosis not present

## 2022-04-06 DIAGNOSIS — M5136 Other intervertebral disc degeneration, lumbar region: Secondary | ICD-10-CM | POA: Diagnosis not present

## 2022-04-07 ENCOUNTER — Other Ambulatory Visit (HOSPITAL_COMMUNITY): Payer: Self-pay

## 2022-04-07 DIAGNOSIS — M5136 Other intervertebral disc degeneration, lumbar region: Secondary | ICD-10-CM | POA: Diagnosis not present

## 2022-04-08 DIAGNOSIS — M5136 Other intervertebral disc degeneration, lumbar region: Secondary | ICD-10-CM | POA: Diagnosis not present

## 2022-04-09 DIAGNOSIS — M5136 Other intervertebral disc degeneration, lumbar region: Secondary | ICD-10-CM | POA: Diagnosis not present

## 2022-04-12 ENCOUNTER — Other Ambulatory Visit: Payer: Self-pay

## 2022-04-12 DIAGNOSIS — L280 Lichen simplex chronicus: Secondary | ICD-10-CM | POA: Diagnosis not present

## 2022-04-12 DIAGNOSIS — M5136 Other intervertebral disc degeneration, lumbar region: Secondary | ICD-10-CM | POA: Diagnosis not present

## 2022-04-13 DIAGNOSIS — M5136 Other intervertebral disc degeneration, lumbar region: Secondary | ICD-10-CM | POA: Diagnosis not present

## 2022-04-14 DIAGNOSIS — M5136 Other intervertebral disc degeneration, lumbar region: Secondary | ICD-10-CM | POA: Diagnosis not present

## 2022-04-15 DIAGNOSIS — M5136 Other intervertebral disc degeneration, lumbar region: Secondary | ICD-10-CM | POA: Diagnosis not present

## 2022-04-16 DIAGNOSIS — M5136 Other intervertebral disc degeneration, lumbar region: Secondary | ICD-10-CM | POA: Diagnosis not present

## 2022-04-19 DIAGNOSIS — M5136 Other intervertebral disc degeneration, lumbar region: Secondary | ICD-10-CM | POA: Diagnosis not present

## 2022-04-20 DIAGNOSIS — M5136 Other intervertebral disc degeneration, lumbar region: Secondary | ICD-10-CM | POA: Diagnosis not present

## 2022-04-21 DIAGNOSIS — M5136 Other intervertebral disc degeneration, lumbar region: Secondary | ICD-10-CM | POA: Diagnosis not present

## 2022-04-22 ENCOUNTER — Ambulatory Visit (INDEPENDENT_AMBULATORY_CARE_PROVIDER_SITE_OTHER): Payer: Medicaid Other | Admitting: Podiatry

## 2022-04-22 ENCOUNTER — Encounter: Payer: Self-pay | Admitting: Podiatry

## 2022-04-22 DIAGNOSIS — E1151 Type 2 diabetes mellitus with diabetic peripheral angiopathy without gangrene: Secondary | ICD-10-CM

## 2022-04-22 DIAGNOSIS — L600 Ingrowing nail: Secondary | ICD-10-CM

## 2022-04-22 DIAGNOSIS — M5136 Other intervertebral disc degeneration, lumbar region: Secondary | ICD-10-CM | POA: Diagnosis not present
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
## 2022-04-23 DIAGNOSIS — M5136 Other intervertebral disc degeneration, lumbar region: Secondary | ICD-10-CM | POA: Diagnosis not present

## 2022-04-26 DIAGNOSIS — M5136 Other intervertebral disc degeneration, lumbar region: Secondary | ICD-10-CM | POA: Diagnosis not present

## 2022-04-27 ENCOUNTER — Other Ambulatory Visit: Payer: Self-pay

## 2022-04-27 ENCOUNTER — Other Ambulatory Visit: Payer: Self-pay | Admitting: Family Medicine

## 2022-04-27 DIAGNOSIS — I739 Peripheral vascular disease, unspecified: Secondary | ICD-10-CM

## 2022-04-27 DIAGNOSIS — M5136 Other intervertebral disc degeneration, lumbar region: Secondary | ICD-10-CM | POA: Diagnosis not present

## 2022-04-27 DIAGNOSIS — I70222 Atherosclerosis of native arteries of extremities with rest pain, left leg: Secondary | ICD-10-CM

## 2022-04-28 DIAGNOSIS — M5136 Other intervertebral disc degeneration, lumbar region: Secondary | ICD-10-CM | POA: Diagnosis not present

## 2022-04-29 DIAGNOSIS — M5136 Other intervertebral disc degeneration, lumbar region: Secondary | ICD-10-CM | POA: Diagnosis not present

## 2022-04-30 DIAGNOSIS — M5136 Other intervertebral disc degeneration, lumbar region: Secondary | ICD-10-CM | POA: Diagnosis not present

## 2022-05-03 ENCOUNTER — Ambulatory Visit: Payer: Medicaid Other | Admitting: Neurology

## 2022-05-03 ENCOUNTER — Ambulatory Visit (INDEPENDENT_AMBULATORY_CARE_PROVIDER_SITE_OTHER): Payer: Medicaid Other | Admitting: Podiatry

## 2022-05-03 ENCOUNTER — Telehealth: Payer: Self-pay | Admitting: *Deleted

## 2022-05-03 DIAGNOSIS — Z91199 Patient's noncompliance with other medical treatment and regimen due to unspecified reason: Secondary | ICD-10-CM

## 2022-05-03 DIAGNOSIS — M5136 Other intervertebral disc degeneration, lumbar region: Secondary | ICD-10-CM | POA: Diagnosis not present

## 2022-05-03 NOTE — Telephone Encounter (Signed)
Patient called stating toe not healed and still draining from nail procedure done by Dr. Blenda Mounts in January. Wanted it checked. Appointment made with Dr. Sherryle Lis on Wed, 3/14 at 4:15

## 2022-05-03 NOTE — Telephone Encounter (Signed)
H&V Care Navigation CSW Progress Note  Clinical Social Worker provided SCAT application that pt dropped off to clinic.  CSW called pt to discuss and confirm information.  Application completed and placed at front desk for pt to pick up as he missed signature line- pt informed.  Patient is participating in a Managed Medicaid Plan:  Yes  [place]: No Food Insecurity (03/24/2021)  Housing: Low Risk  (11/02/2021)  Transportation Needs: No Transportation Needs (07/15/2021)  Recent Concern: Transportation Needs - Unmet Transportation Needs (04/24/2021)  Alcohol Screen: Low Risk  (11/14/2020)  Depression (PHQ2-9): Low Risk  (03/08/2022)  Financial Resource Strain: High Risk (11/14/2020)  Physical Activity: Insufficiently Active (05/11/2021)  Social Connections: Moderately Isolated (10/16/2018)  Stress: No Stress Concern Present (10/16/2018)  Tobacco Use: High Risk (04/22/2022)    Jorge Ny, LCSW Clinical Social Worker Advanced [facility] Failure Clinic Desk#: [phone] Cell#: [phone]

## 2022-05-04 ENCOUNTER — Other Ambulatory Visit: Payer: Self-pay | Admitting: Student

## 2022-05-04 DIAGNOSIS — M5136 Other intervertebral disc degeneration, lumbar region: Secondary | ICD-10-CM | POA: Diagnosis not present

## 2022-05-05 ENCOUNTER — Ambulatory Visit (INDEPENDENT_AMBULATORY_CARE_PROVIDER_SITE_OTHER): Payer: Medicaid Other | Admitting: Podiatry

## 2022-05-05 ENCOUNTER — Other Ambulatory Visit (HOSPITAL_COMMUNITY): Payer: Self-pay

## 2022-05-05 ENCOUNTER — Telehealth (HOSPITAL_COMMUNITY): Payer: Self-pay | Admitting: Pharmacist

## 2022-05-05 DIAGNOSIS — M5136 Other intervertebral disc degeneration, lumbar region: Secondary | ICD-10-CM | POA: Diagnosis not present

## 2022-05-05 DIAGNOSIS — L600 Ingrowing nail: Secondary | ICD-10-CM | POA: Diagnosis not present

## 2022-05-05 DIAGNOSIS — I739 Peripheral vascular disease, unspecified: Secondary | ICD-10-CM | POA: Diagnosis not present

## 2022-05-05 NOTE — Telephone Encounter (Signed)
Patient Advocate Encounter   Received notification from [facility] that prior authorization for Amvuttra is required. This will be through the medical benefit.   PA submitted via [employer] portal Status is pending   Will continue to follow.  Audry Riles, PharmD, BCPS, BCCP, CPP Heart Failure Clinic Pharmacist [phone]

## 2022-05-05 NOTE — Progress Notes (Signed)
Patient was no-show for appointment today 

## 2022-05-06 DIAGNOSIS — M5136 Other intervertebral disc degeneration, lumbar region: Secondary | ICD-10-CM | POA: Diagnosis not present

## 2022-05-06 NOTE — Telephone Encounter (Signed)
Advanced Heart Failure Patient Advocate Encounter  Prior Authorization for Amvuttra has been approved.  CPT code [phone] and [phone] have been approved through the medical benefit.  PA# [id number] Effective dates: 05/03/22 through 05/03/23  Audry Riles, PharmD, BCPS, BCCP, CPP Heart Failure Clinic Pharmacist [phone]

## 2022-05-07 DIAGNOSIS — M5136 Other intervertebral disc degeneration, lumbar region: Secondary | ICD-10-CM | POA: Diagnosis not present

## 2022-05-10 DIAGNOSIS — M5136 Other intervertebral disc degeneration, lumbar region: Secondary | ICD-10-CM | POA: Diagnosis not present

## 2022-05-10 NOTE — Progress Notes (Signed)
  Subjective:  Patient ID: Nebiyu Gerhart, male    DOB: 1960/10/30,  MRN: [id number]  Chief Complaint  Patient presents with   Foot Ulcer    open wound with drainage    62 y.o. male presents with the above complaint. History confirmed with patient.  He wanted to have the toenail check to make sure it is healing okay and not infected  Objective:  Physical Exam: warm, good capillary refill, no trophic changes or ulcerative lesions, normal sensory exam, and weakly palpable DP and PT pulse, proximal portion of matricectomy site still granular and healing, no signs of infection cellulitis drainage or purulence      Assessment:   1. Ingrown left greater toenail   2. PAD (peripheral artery disease) ([place])      Plan:  Patient was evaluated and treated and all questions answered.  Continues to heal albeit slowly.  I recommend he continue to utilize antibiotic ointment and Betadine depending on the amount of drainage.  He has upcoming follow-up with vascular surgery next week to reevaluate his previous PAD.  Currently no signs of infection, he will let us know if this develops.  He has upcoming follow-up scheduled in April with Dr. Blenda Mounts and will follow-up with her  Return if symptoms worsen or fail to improve.

## 2022-05-11 ENCOUNTER — Ambulatory Visit: Payer: Medicaid Other

## 2022-05-11 ENCOUNTER — Ambulatory Visit (HOSPITAL_COMMUNITY): Payer: Medicaid Other

## 2022-05-11 ENCOUNTER — Other Ambulatory Visit: Payer: Self-pay

## 2022-05-11 DIAGNOSIS — M5136 Other intervertebral disc degeneration, lumbar region: Secondary | ICD-10-CM | POA: Diagnosis not present

## 2022-05-12 DIAGNOSIS — M5136 Other intervertebral disc degeneration, lumbar region: Secondary | ICD-10-CM | POA: Diagnosis not present

## 2022-05-13 DIAGNOSIS — M5136 Other intervertebral disc degeneration, lumbar region: Secondary | ICD-10-CM | POA: Diagnosis not present

## 2022-05-14 DIAGNOSIS — M5136 Other intervertebral disc degeneration, lumbar region: Secondary | ICD-10-CM | POA: Diagnosis not present

## 2022-05-17 DIAGNOSIS — M5136 Other intervertebral disc degeneration, lumbar region: Secondary | ICD-10-CM | POA: Diagnosis not present

## 2022-05-18 DIAGNOSIS — M5136 Other intervertebral disc degeneration, lumbar region: Secondary | ICD-10-CM | POA: Diagnosis not present

## 2022-05-19 DIAGNOSIS — M5136 Other intervertebral disc degeneration, lumbar region: Secondary | ICD-10-CM | POA: Diagnosis not present

## 2022-05-20 DIAGNOSIS — M5136 Other intervertebral disc degeneration, lumbar region: Secondary | ICD-10-CM | POA: Diagnosis not present

## 2022-05-21 DIAGNOSIS — M5136 Other intervertebral disc degeneration, lumbar region: Secondary | ICD-10-CM | POA: Diagnosis not present

## 2022-05-24 DIAGNOSIS — M5136 Other intervertebral disc degeneration, lumbar region: Secondary | ICD-10-CM | POA: Diagnosis not present

## 2022-05-25 DIAGNOSIS — M5136 Other intervertebral disc degeneration, lumbar region: Secondary | ICD-10-CM | POA: Diagnosis not present

## 2022-05-26 DIAGNOSIS — M5136 Other intervertebral disc degeneration, lumbar region: Secondary | ICD-10-CM | POA: Diagnosis not present

## 2022-05-27 ENCOUNTER — Encounter: Payer: Self-pay | Admitting: Podiatry

## 2022-05-27 ENCOUNTER — Ambulatory Visit (INDEPENDENT_AMBULATORY_CARE_PROVIDER_SITE_OTHER): Payer: Medicaid Other | Admitting: Podiatry

## 2022-05-27 DIAGNOSIS — M79675 Pain in left toe(s): Secondary | ICD-10-CM

## 2022-05-27 DIAGNOSIS — I739 Peripheral vascular disease, unspecified: Secondary | ICD-10-CM | POA: Diagnosis not present

## 2022-05-27 DIAGNOSIS — B351 Tinea unguium: Secondary | ICD-10-CM

## 2022-05-27 DIAGNOSIS — M79674 Pain in right toe(s): Secondary | ICD-10-CM

## 2022-05-27 DIAGNOSIS — E1151 Type 2 diabetes mellitus with diabetic peripheral angiopathy without gangrene: Secondary | ICD-10-CM

## 2022-05-27 DIAGNOSIS — M5136 Other intervertebral disc degeneration, lumbar region: Secondary | ICD-10-CM | POA: Diagnosis not present
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
## 2022-05-28 DIAGNOSIS — M5136 Other intervertebral disc degeneration, lumbar region: Secondary | ICD-10-CM | POA: Diagnosis not present

## 2022-05-31 DIAGNOSIS — M5136 Other intervertebral disc degeneration, lumbar region: Secondary | ICD-10-CM | POA: Diagnosis not present

## 2022-06-01 ENCOUNTER — Other Ambulatory Visit (HOSPITAL_COMMUNITY): Payer: Self-pay

## 2022-06-01 DIAGNOSIS — M5136 Other intervertebral disc degeneration, lumbar region: Secondary | ICD-10-CM | POA: Diagnosis not present

## 2022-06-02 DIAGNOSIS — M5136 Other intervertebral disc degeneration, lumbar region: Secondary | ICD-10-CM | POA: Diagnosis not present

## 2022-06-03 DIAGNOSIS — M5136 Other intervertebral disc degeneration, lumbar region: Secondary | ICD-10-CM | POA: Diagnosis not present

## 2022-06-04 DIAGNOSIS — M5136 Other intervertebral disc degeneration, lumbar region: Secondary | ICD-10-CM | POA: Diagnosis not present

## 2022-06-07 ENCOUNTER — Other Ambulatory Visit: Payer: Self-pay

## 2022-06-07 DIAGNOSIS — M5136 Other intervertebral disc degeneration, lumbar region: Secondary | ICD-10-CM | POA: Diagnosis not present

## 2022-06-08 DIAGNOSIS — M5136 Other intervertebral disc degeneration, lumbar region: Secondary | ICD-10-CM | POA: Diagnosis not present

## 2022-06-09 ENCOUNTER — Ambulatory Visit (HOSPITAL_COMMUNITY)
Admission: RE | Admit: 2022-06-09 | Discharge: 2022-06-09 | Disposition: A | Discharge status: Home / Self Care | Payer: Medicaid Other | Source: Ambulatory Visit | Attending: Internal Medicine | Admitting: Internal Medicine

## 2022-06-09 DIAGNOSIS — E851 Neuropathic heredofamilial amyloidosis: Secondary | ICD-10-CM

## 2022-06-09 DIAGNOSIS — I110 Hypertensive heart disease with heart failure: Secondary | ICD-10-CM | POA: Diagnosis not present

## 2022-06-09 DIAGNOSIS — I5030 Unspecified diastolic (congestive) heart failure: Secondary | ICD-10-CM | POA: Diagnosis not present

## 2022-06-09 DIAGNOSIS — Z79899 Other long term (current) drug therapy: Secondary | ICD-10-CM | POA: Diagnosis not present

## 2022-06-09 DIAGNOSIS — Z7901 Long term (current) use of anticoagulants: Secondary | ICD-10-CM | POA: Insufficient documentation

## 2022-06-09 DIAGNOSIS — M5136 Other intervertebral disc degeneration, lumbar region: Secondary | ICD-10-CM | POA: Diagnosis not present

## 2022-06-09 DIAGNOSIS — Z86711 Personal history of pulmonary embolism: Secondary | ICD-10-CM | POA: Diagnosis not present

## 2022-06-09 DIAGNOSIS — G63 Polyneuropathy in diseases classified elsewhere: Secondary | ICD-10-CM | POA: Diagnosis not present

## 2022-06-09 DIAGNOSIS — I5032 Chronic diastolic (congestive) heart failure: Secondary | ICD-10-CM

## 2022-06-09 DIAGNOSIS — F17200 Nicotine dependence, unspecified, uncomplicated: Secondary | ICD-10-CM | POA: Insufficient documentation

## 2022-06-09 DIAGNOSIS — G4733 Obstructive sleep apnea (adult) (pediatric): Secondary | ICD-10-CM | POA: Insufficient documentation

## 2022-06-09 DIAGNOSIS — Z86718 Personal history of other venous thrombosis and embolism: Secondary | ICD-10-CM | POA: Insufficient documentation

## 2022-06-09 MED ORDER — VUTRISIRAN SODIUM 25 MG/0.5ML ~~LOC~~ SOSY
25.0000 mg | PREFILLED_SYRINGE | Freq: Once | SUBCUTANEOUS | Status: AC
  Administered 2022-06-09: 25 mg via SUBCUTANEOUS
  Filled 2022-06-09: qty 0.5

## 2022-06-09 NOTE — Progress Notes (Signed)
    Advanced Heart Failure Clinic Note  PCP: Jerre Simon, MD PCP-Cardiologist: Armanda Magic, MD  Neurology: Dr Allena Katz HF Cardiology: Dr. Gala Romney  HPI:  Russell Collier is a 62 y.o. 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    Echo 01/2018, EF 55-60%, nonobstructive cardiac cath 01/2018 (20% prox LAD, 60% mid Cx, 50% post atrio lesion) h/o bradycardia w/ ? 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    PYP scan 09/2018 and was an equivocal study. No visual increase in PYP uptake, but ratio was between 1-1.5. Based clinical suspicion, he was ordered to undergo genetic testing and results + for TTR gene.   He was seen by neurology, neuropathy associated with TTR.    Admitted 07/27/19 with atypical chest pain and wheezing. Troponins were low and flat, EKG reassuring. A CTA was performed, which showed a possible segmental PE. Since he does have a history of PE, he was continued on IV heparin and transitioned to Eliquis on 07/28/19. Echo 07/27/19 EF 55% RV normal.    At his HF follow up 12/2019 Eliquis was decreased due to frequent nose bleeds, mobility limited by back and knee pain.   S/p L3-S1 decompression 03/2020, uneventful hospitalization at [facility].   Graduated from paramedicine 09/2020.   Follow up with Neurology 03/2021, recommended starting Amvuttra and recommended restarting PT.   Echo 04/2021 EF 50-55% RV ok    Admitted 5/18-22/23 for RLE cellulitis. S/p RLE angiography with CO2, no intervention. New wound on LLE, and underwent LLE angiography with balloon angioplasty of left peroneal artery. Had LLE wound which is now healed.    At HF follow up 01/2022 Imdur discontinued and enalapril decreased to 10  mg nightly due to orthostasis.  Today he returns to HF clinic for injection of Amvuttra. Overall feeling well today. No contraindications to injection.   Assessment/Plan: 1. Familial TTR Cardiac Amyloidosis:  - PYP scan 09/2018 was equivocal. + genetic testing. Has TTR gene, heterozygous.  - He is on tafamadis and referred for family genetic testing. No family members with TTR.  - Based on PYP scan not thought to have extensive cardiac involvement at this point.  - He has numerous neuropathic symptoms. Followed by Dr. Nita Sickle in Neurology for neuropathy - Continue Tafamidis. 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 Patient tolerated injection well. Provided patient counseling on Amvuttra. Most common side effects are injection site reactions, arthralgias, dyspnea and vitamin A deficiency. Patient is aware to return to clinic every 3 months for repeat injection.   - Continue vitamin A supplement 8000 IU daily. Amvuttra decreases serum vitamin A levels.   Follow up 3 months for repeat Amvuttra injection.   Irish Elders, PharmD PGY-1 [facility] Pharmacy Resident  Karle Plumber, PharmD, BCPS, [facility], CPP Heart Failure Clinic Pharmacist [phone]

## 2022-06-09 NOTE — Patient Instructions (Signed)
It was a pleasure seeing you today!  MEDICATIONS: -No medication changes today   NEXT APPOINTMENT: Return to clinic in 3 months for repeat Amvuttra.  In general, to take care of your heart failure: -Limit your fluid intake to 2 Liters (half-gallon) per day.   -Limit your salt intake to ideally 2-3 grams (2000-3000 mg) per day. -Weigh yourself daily and record, and bring that "weight diary" to your next appointment.  (Weight gain of 2-3 pounds in 1 day typically means fluid weight.) -The medications for your heart are to help your heart and help you live longer.   -Please contact us before stopping any of your heart medications.  Call the clinic at [phone] with questions or to reschedule future appointments.

## 2022-06-10 DIAGNOSIS — M5136 Other intervertebral disc degeneration, lumbar region: Secondary | ICD-10-CM | POA: Diagnosis not present

## 2022-06-11 DIAGNOSIS — M5136 Other intervertebral disc degeneration, lumbar region: Secondary | ICD-10-CM | POA: Diagnosis not present

## 2022-06-14 DIAGNOSIS — M5136 Other intervertebral disc degeneration, lumbar region: Secondary | ICD-10-CM | POA: Diagnosis not present

## 2022-06-15 DIAGNOSIS — M5136 Other intervertebral disc degeneration, lumbar region: Secondary | ICD-10-CM | POA: Diagnosis not present

## 2022-06-16 ENCOUNTER — Ambulatory Visit (INDEPENDENT_AMBULATORY_CARE_PROVIDER_SITE_OTHER)
Admission: RE | Admit: 2022-06-16 | Discharge: 2022-06-16 | Disposition: A | Discharge status: Home / Self Care | Payer: Medicaid Other | Source: Ambulatory Visit | Attending: Vascular Surgery | Admitting: Vascular Surgery

## 2022-06-16 ENCOUNTER — Ambulatory Visit (HOSPITAL_COMMUNITY)
Admission: RE | Admit: 2022-06-16 | Discharge: 2022-06-16 | Disposition: A | Discharge status: Home / Self Care | Payer: Medicaid Other | Source: Ambulatory Visit | Attending: Vascular Surgery | Admitting: Vascular Surgery

## 2022-06-16 ENCOUNTER — Ambulatory Visit: Payer: Medicaid Other | Admitting: Physician Assistant

## 2022-06-16 VITALS — BP 114/70 | HR 65 | Temp 97.7°F | Wt 304.0 lb

## 2022-06-16 DIAGNOSIS — I70221 Atherosclerosis of native arteries of extremities with rest pain, right leg: Secondary | ICD-10-CM | POA: Diagnosis not present

## 2022-06-16 DIAGNOSIS — I739 Peripheral vascular disease, unspecified: Secondary | ICD-10-CM | POA: Insufficient documentation

## 2022-06-16 DIAGNOSIS — I70222 Atherosclerosis of native arteries of extremities with rest pain, left leg: Secondary | ICD-10-CM

## 2022-06-16 DIAGNOSIS — M5136 Other intervertebral disc degeneration, lumbar region: Secondary | ICD-10-CM | POA: Diagnosis not present

## 2022-06-16 LAB — VAS US ABI WITH/WO TBI
Left ABI: 1.12
Right ABI: 0.94

## 2022-06-17 ENCOUNTER — Telehealth: Payer: Self-pay | Admitting: *Deleted

## 2022-06-17 ENCOUNTER — Encounter: Payer: Self-pay | Admitting: Physician Assistant

## 2022-06-17 DIAGNOSIS — M5136 Other intervertebral disc degeneration, lumbar region: Secondary | ICD-10-CM | POA: Diagnosis not present

## 2022-06-17 NOTE — Telephone Encounter (Signed)
Looks like he saw vascular yesterday and they went over results with him. He is clear to start PT from our standpoint as well. I believe it was something he was working with ortho on. I had not sent him for PT.

## 2022-06-17 NOTE — Telephone Encounter (Signed)
Patient has been given recommendations and go ahead to start PT thru voice message.
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
## 2022-06-17 NOTE — Telephone Encounter (Signed)
Patient is calling for test results(Vascular- ready to view in epic), said that the vascular physician said that his wound has healed and he ready to start PT. Please advise.

## 2022-06-18 DIAGNOSIS — M5136 Other intervertebral disc degeneration, lumbar region: Secondary | ICD-10-CM | POA: Diagnosis not present

## 2022-06-21 DIAGNOSIS — M5136 Other intervertebral disc degeneration, lumbar region: Secondary | ICD-10-CM | POA: Diagnosis not present

## 2022-06-22 DIAGNOSIS — M5136 Other intervertebral disc degeneration, lumbar region: Secondary | ICD-10-CM | POA: Diagnosis not present

## 2022-06-23 ENCOUNTER — Other Ambulatory Visit: Payer: Self-pay | Admitting: *Deleted

## 2022-06-23 ENCOUNTER — Other Ambulatory Visit: Payer: Self-pay

## 2022-06-23 DIAGNOSIS — I739 Peripheral vascular disease, unspecified: Secondary | ICD-10-CM

## 2022-06-23 DIAGNOSIS — I70222 Atherosclerosis of native arteries of extremities with rest pain, left leg: Secondary | ICD-10-CM

## 2022-06-23 DIAGNOSIS — M5136 Other intervertebral disc degeneration, lumbar region: Secondary | ICD-10-CM | POA: Diagnosis not present

## 2022-06-24 ENCOUNTER — Ambulatory Visit (HOSPITAL_BASED_OUTPATIENT_CLINIC_OR_DEPARTMENT_OTHER): Payer: Medicaid Other | Attending: Orthopaedic Surgery | Admitting: Physical Therapy

## 2022-06-24 DIAGNOSIS — R262 Difficulty in walking, not elsewhere classified: Secondary | ICD-10-CM | POA: Insufficient documentation

## 2022-06-24 DIAGNOSIS — M5136 Other intervertebral disc degeneration, lumbar region: Secondary | ICD-10-CM | POA: Diagnosis not present

## 2022-06-24 DIAGNOSIS — M6281 Muscle weakness (generalized): Secondary | ICD-10-CM | POA: Diagnosis present

## 2022-06-24 DIAGNOSIS — M79671 Pain in right foot: Secondary | ICD-10-CM | POA: Diagnosis present

## 2022-06-24 DIAGNOSIS — M5459 Other low back pain: Secondary | ICD-10-CM | POA: Diagnosis present

## 2022-06-24 DIAGNOSIS — M25671 Stiffness of right ankle, not elsewhere classified: Secondary | ICD-10-CM | POA: Insufficient documentation

## 2022-06-24 DIAGNOSIS — M25672 Stiffness of left ankle, not elsewhere classified: Secondary | ICD-10-CM | POA: Diagnosis present
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
## 2022-06-25 ENCOUNTER — Encounter (HOSPITAL_BASED_OUTPATIENT_CLINIC_OR_DEPARTMENT_OTHER): Payer: Self-pay | Admitting: Physical Therapy

## 2022-06-25 ENCOUNTER — Other Ambulatory Visit: Payer: Self-pay

## 2022-06-25 DIAGNOSIS — M5136 Other intervertebral disc degeneration, lumbar region: Secondary | ICD-10-CM | POA: Diagnosis not present

## 2022-06-28 DIAGNOSIS — M5136 Other intervertebral disc degeneration, lumbar region: Secondary | ICD-10-CM | POA: Diagnosis not present

## 2022-06-29 DIAGNOSIS — M5136 Other intervertebral disc degeneration, lumbar region: Secondary | ICD-10-CM | POA: Diagnosis not present

## 2022-06-30 ENCOUNTER — Ambulatory Visit (HOSPITAL_COMMUNITY)
Admission: RE | Admit: 2022-06-30 | Discharge: 2022-06-30 | Disposition: A | Discharge status: Home / Self Care | Payer: Medicaid Other | Source: Ambulatory Visit | Attending: Vascular Surgery | Admitting: Vascular Surgery

## 2022-06-30 ENCOUNTER — Other Ambulatory Visit: Payer: Self-pay | Admitting: Student

## 2022-06-30 DIAGNOSIS — M5136 Other intervertebral disc degeneration, lumbar region: Secondary | ICD-10-CM | POA: Diagnosis not present

## 2022-06-30 DIAGNOSIS — I70222 Atherosclerosis of native arteries of extremities with rest pain, left leg: Secondary | ICD-10-CM

## 2022-06-30 DIAGNOSIS — I739 Peripheral vascular disease, unspecified: Secondary | ICD-10-CM

## 2022-07-01 ENCOUNTER — Encounter: Payer: Self-pay | Admitting: Physician Assistant

## 2022-07-01 ENCOUNTER — Ambulatory Visit (INDEPENDENT_AMBULATORY_CARE_PROVIDER_SITE_OTHER): Payer: Medicaid Other | Admitting: Physician Assistant

## 2022-07-01 DIAGNOSIS — I70222 Atherosclerosis of native arteries of extremities with rest pain, left leg: Secondary | ICD-10-CM | POA: Diagnosis not present

## 2022-07-01 DIAGNOSIS — F17200 Nicotine dependence, unspecified, uncomplicated: Secondary | ICD-10-CM | POA: Diagnosis not present

## 2022-07-01 DIAGNOSIS — I739 Peripheral vascular disease, unspecified: Secondary | ICD-10-CM

## 2022-07-01 DIAGNOSIS — M5136 Other intervertebral disc degeneration, lumbar region: Secondary | ICD-10-CM | POA: Diagnosis not present
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
## 2022-07-02 ENCOUNTER — Other Ambulatory Visit (HOSPITAL_COMMUNITY): Payer: Self-pay

## 2022-07-02 DIAGNOSIS — M5136 Other intervertebral disc degeneration, lumbar region: Secondary | ICD-10-CM | POA: Diagnosis not present

## 2022-07-05 ENCOUNTER — Other Ambulatory Visit (HOSPITAL_COMMUNITY): Payer: Self-pay

## 2022-07-12 ENCOUNTER — Encounter (HOSPITAL_BASED_OUTPATIENT_CLINIC_OR_DEPARTMENT_OTHER): Payer: Self-pay

## 2022-07-12 ENCOUNTER — Ambulatory Visit (HOSPITAL_BASED_OUTPATIENT_CLINIC_OR_DEPARTMENT_OTHER): Payer: Medicaid Other

## 2022-07-12 DIAGNOSIS — M5459 Other low back pain: Secondary | ICD-10-CM | POA: Diagnosis not present

## 2022-07-12 DIAGNOSIS — R262 Difficulty in walking, not elsewhere classified: Secondary | ICD-10-CM

## 2022-07-12 DIAGNOSIS — M79671 Pain in right foot: Secondary | ICD-10-CM

## 2022-07-12 DIAGNOSIS — M6281 Muscle weakness (generalized): Secondary | ICD-10-CM

## 2022-07-12 DIAGNOSIS — M25672 Stiffness of left ankle, not elsewhere classified: Secondary | ICD-10-CM

## 2022-07-12 DIAGNOSIS — M5136 Other intervertebral disc degeneration, lumbar region: Secondary | ICD-10-CM | POA: Diagnosis not present

## 2022-07-12 DIAGNOSIS — M25671 Stiffness of right ankle, not elsewhere classified: Secondary | ICD-10-CM
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
## 2022-07-13 DIAGNOSIS — M5136 Other intervertebral disc degeneration, lumbar region: Secondary | ICD-10-CM | POA: Diagnosis not present

## 2022-07-14 DIAGNOSIS — M5136 Other intervertebral disc degeneration, lumbar region: Secondary | ICD-10-CM | POA: Diagnosis not present

## 2022-07-15 DIAGNOSIS — M5136 Other intervertebral disc degeneration, lumbar region: Secondary | ICD-10-CM | POA: Diagnosis not present

## 2022-07-16 DIAGNOSIS — M5136 Other intervertebral disc degeneration, lumbar region: Secondary | ICD-10-CM | POA: Diagnosis not present

## 2022-07-19 DIAGNOSIS — M5136 Other intervertebral disc degeneration, lumbar region: Secondary | ICD-10-CM | POA: Diagnosis not present

## 2022-07-20 DIAGNOSIS — M5136 Other intervertebral disc degeneration, lumbar region: Secondary | ICD-10-CM | POA: Diagnosis not present

## 2022-07-21 DIAGNOSIS — M5136 Other intervertebral disc degeneration, lumbar region: Secondary | ICD-10-CM | POA: Diagnosis not present

## 2022-07-22 ENCOUNTER — Encounter (HOSPITAL_BASED_OUTPATIENT_CLINIC_OR_DEPARTMENT_OTHER): Payer: Self-pay | Admitting: Physical Therapy

## 2022-07-22 ENCOUNTER — Ambulatory Visit (HOSPITAL_BASED_OUTPATIENT_CLINIC_OR_DEPARTMENT_OTHER): Payer: Medicaid Other | Admitting: Physical Therapy

## 2022-07-22 DIAGNOSIS — M6281 Muscle weakness (generalized): Secondary | ICD-10-CM

## 2022-07-22 DIAGNOSIS — M25672 Stiffness of left ankle, not elsewhere classified: Secondary | ICD-10-CM

## 2022-07-22 DIAGNOSIS — M5459 Other low back pain: Secondary | ICD-10-CM | POA: Diagnosis not present

## 2022-07-22 DIAGNOSIS — M5136 Other intervertebral disc degeneration, lumbar region: Secondary | ICD-10-CM | POA: Diagnosis not present
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
## 2022-07-23 DIAGNOSIS — M5136 Other intervertebral disc degeneration, lumbar region: Secondary | ICD-10-CM | POA: Diagnosis not present

## 2022-07-26 ENCOUNTER — Encounter (HOSPITAL_BASED_OUTPATIENT_CLINIC_OR_DEPARTMENT_OTHER): Payer: Self-pay | Admitting: Physical Therapy

## 2022-07-26 ENCOUNTER — Ambulatory Visit (HOSPITAL_BASED_OUTPATIENT_CLINIC_OR_DEPARTMENT_OTHER): Payer: Medicaid Other | Attending: Orthopaedic Surgery | Admitting: Physical Therapy

## 2022-07-26 DIAGNOSIS — M25671 Stiffness of right ankle, not elsewhere classified: Secondary | ICD-10-CM

## 2022-07-26 DIAGNOSIS — M25672 Stiffness of left ankle, not elsewhere classified: Secondary | ICD-10-CM

## 2022-07-26 DIAGNOSIS — M79604 Pain in right leg: Secondary | ICD-10-CM | POA: Diagnosis present

## 2022-07-26 DIAGNOSIS — R262 Difficulty in walking, not elsewhere classified: Secondary | ICD-10-CM | POA: Diagnosis present

## 2022-07-26 DIAGNOSIS — M5136 Other intervertebral disc degeneration, lumbar region: Secondary | ICD-10-CM | POA: Diagnosis not present

## 2022-07-26 DIAGNOSIS — M79671 Pain in right foot: Secondary | ICD-10-CM | POA: Diagnosis present

## 2022-07-26 DIAGNOSIS — M6281 Muscle weakness (generalized): Secondary | ICD-10-CM | POA: Diagnosis present

## 2022-07-26 DIAGNOSIS — M5459 Other low back pain: Secondary | ICD-10-CM | POA: Diagnosis present

## 2022-07-26 DIAGNOSIS — S76311A Strain of muscle, fascia and tendon of the posterior muscle group at thigh level, right thigh, initial encounter: Secondary | ICD-10-CM | POA: Diagnosis present
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
## 2022-07-27 ENCOUNTER — Other Ambulatory Visit (HOSPITAL_COMMUNITY): Payer: Self-pay | Admitting: Cardiology

## 2022-07-27 ENCOUNTER — Other Ambulatory Visit: Payer: Self-pay | Admitting: Vascular Surgery

## 2022-07-27 ENCOUNTER — Other Ambulatory Visit: Payer: Self-pay | Admitting: Student

## 2022-07-27 DIAGNOSIS — I10 Essential (primary) hypertension: Secondary | ICD-10-CM

## 2022-07-27 DIAGNOSIS — G4733 Obstructive sleep apnea (adult) (pediatric): Secondary | ICD-10-CM

## 2022-07-27 DIAGNOSIS — M5136 Other intervertebral disc degeneration, lumbar region: Secondary | ICD-10-CM | POA: Diagnosis not present

## 2022-07-27 DIAGNOSIS — I5032 Chronic diastolic (congestive) heart failure: Secondary | ICD-10-CM

## 2022-07-27 DIAGNOSIS — I5030 Unspecified diastolic (congestive) heart failure: Secondary | ICD-10-CM

## 2022-07-28 ENCOUNTER — Encounter (HOSPITAL_BASED_OUTPATIENT_CLINIC_OR_DEPARTMENT_OTHER): Payer: Self-pay | Admitting: Physical Therapy

## 2022-07-28 ENCOUNTER — Ambulatory Visit (HOSPITAL_BASED_OUTPATIENT_CLINIC_OR_DEPARTMENT_OTHER): Payer: Medicaid Other | Admitting: Physical Therapy

## 2022-07-28 DIAGNOSIS — M25671 Stiffness of right ankle, not elsewhere classified: Secondary | ICD-10-CM

## 2022-07-28 DIAGNOSIS — M5459 Other low back pain: Secondary | ICD-10-CM | POA: Diagnosis not present

## 2022-07-28 DIAGNOSIS — R262 Difficulty in walking, not elsewhere classified: Secondary | ICD-10-CM

## 2022-07-28 DIAGNOSIS — M6281 Muscle weakness (generalized): Secondary | ICD-10-CM

## 2022-07-28 DIAGNOSIS — M79671 Pain in right foot: Secondary | ICD-10-CM

## 2022-07-28 DIAGNOSIS — M25672 Stiffness of left ankle, not elsewhere classified: Secondary | ICD-10-CM

## 2022-07-28 DIAGNOSIS — M5136 Other intervertebral disc degeneration, lumbar region: Secondary | ICD-10-CM | POA: Diagnosis not present
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
## 2022-07-29 ENCOUNTER — Other Ambulatory Visit (HOSPITAL_COMMUNITY): Payer: Self-pay

## 2022-07-29 DIAGNOSIS — M5136 Other intervertebral disc degeneration, lumbar region: Secondary | ICD-10-CM | POA: Diagnosis not present

## 2022-07-30 DIAGNOSIS — M5136 Other intervertebral disc degeneration, lumbar region: Secondary | ICD-10-CM | POA: Diagnosis not present

## 2022-08-02 ENCOUNTER — Emergency Department (HOSPITAL_COMMUNITY)
Admission: EM | Admit: 2022-08-02 | Discharge: 2022-08-02 | Disposition: A | Discharge status: Home / Self Care | Payer: Medicaid Other | Attending: Emergency Medicine | Admitting: Emergency Medicine

## 2022-08-02 ENCOUNTER — Encounter (HOSPITAL_COMMUNITY): Payer: Self-pay

## 2022-08-02 ENCOUNTER — Ambulatory Visit (HOSPITAL_BASED_OUTPATIENT_CLINIC_OR_DEPARTMENT_OTHER): Payer: Medicaid Other | Admitting: Physical Therapy

## 2022-08-02 DIAGNOSIS — L03116 Cellulitis of left lower limb: Secondary | ICD-10-CM

## 2022-08-02 DIAGNOSIS — Z7901 Long term (current) use of anticoagulants: Secondary | ICD-10-CM | POA: Diagnosis not present

## 2022-08-02 DIAGNOSIS — I5032 Chronic diastolic (congestive) heart failure: Secondary | ICD-10-CM | POA: Diagnosis not present

## 2022-08-02 DIAGNOSIS — I10 Essential (primary) hypertension: Secondary | ICD-10-CM | POA: Diagnosis not present

## 2022-08-02 DIAGNOSIS — I2510 Atherosclerotic heart disease of native coronary artery without angina pectoris: Secondary | ICD-10-CM | POA: Diagnosis not present

## 2022-08-02 DIAGNOSIS — M5136 Other intervertebral disc degeneration, lumbar region: Secondary | ICD-10-CM | POA: Diagnosis not present

## 2022-08-02 MED ORDER — CEPHALEXIN 500 MG PO CAPS: 500.0000 mg | ORAL_CAPSULE | Freq: Three times a day (TID) | ORAL | 0 refills | Status: DC

## 2022-08-02 NOTE — ED Triage Notes (Signed)
Pt c/o redness and pain in his left lower leg. Pt is concerned for cellulitis, has had multiple episodes of the same. Pt states he bumped into something over the weekend, irritating his leg.

## 2022-08-02 NOTE — Discharge Instructions (Addendum)
Return to the ED with any new or worsening signs or symptoms such as body aches, chills, fevers at home Please begin taking antibiotic, Keflex, 3 times daily for the next 7 days Please follow-up with [facility] in 5 days for reevaluation Please read the attached guide concerning cellulitis

## 2022-08-02 NOTE — ED Provider Notes (Signed)
Russell Collier Provider Note   CSN: [id number] Arrival date & time: 08/02/22  1352     History  Chief Complaint  Patient presents with   Cellulitis    Russell Collier is a 62 y.o. male with medical history of hypertension, chronic diastolic CHF, CAD, pulmonary embolism currently on blood thinning medication.  The patient presents to ED for evaluation of left lower leg redness.  The patient reports that 9 days ago he bumped his left leg on a brick building and has had resulting redness and pain to his left lower extremity since then.  He reports a history of cellulitis in the past which she was hospitalized for work.  He states that the past instance was much more painful and red than this.  He states that he was febrile at this time and had extensive body aches and chills.  He denies any of these as occurring today.  He denies fevers, nausea or vomiting, body aches or chills.  He reports history of cellulitis and states that Keflex has worked very well for him in the past.  He is requesting antibiotics.  He denies any calf pain, chest pain or shortness of breath.  He reports he is compliant on blood thinning medication.  He states that he is able to follow-up very closely with his PCP, Russell Collier and wellness.  HPI     Home Medications Prior to Admission medications   Medication Sig Start Date End Date Taking? Authorizing Provider  cephALEXin (KEFLEX) 500 MG capsule Take 1 capsule (500 mg total) by mouth 3 (three) times daily. 08/02/22  Yes Al Decant, PA-C  acetaminophen (TYLENOL) 325 MG tablet Take 2 tablets (650 mg total) by mouth every 6 (six) hours as needed for mild pain or moderate pain. 07/13/21   Dameron, Nolberto Hanlon, DO  albuterol (PROVENTIL) (2.5 MG/3ML) 0.083% nebulizer solution Take 3 mLs (2.5 mg total) by nebulization every 6 (six) hours as needed for wheezing or shortness of breath. 11/20/20   Celedonio Savage, MD   ANORO ELLIPTA 62.5-25 MCG/ACT AEPB INHALE 1 PUFF INTO THE LUNGS DAILY AS NEEDED. 11/23/21   Jerre Simon, MD  betamethasone valerate ointment (VALISONE) 0.1 % Apply 1 Application topically 2 (two) times daily. 03/08/22   Jerre Simon, MD  clopidogrel (PLAVIX) 75 MG tablet TAKE 1 TABLET (75 MG TOTAL) BY MOUTH DAILY. 07/27/22 07/27/23  Maeola Harman, MD  ELIQUIS 2.5 MG TABS tablet TAKE 1 TABLET (2.5 MG TOTAL) BY MOUTH 2 (TWO) TIMES DAILY. 10/23/21   Jerre Simon, MD  enalapril (VASOTEC) 10 MG tablet Take 1 tablet (10 mg total) by mouth at bedtime. 02/18/22   [name], Bevelyn Buckles, MD  FARXIGA 10 MG TABS tablet TAKE 1 TABLET (10 MG TOTAL) BY MOUTH DAILY BEFORE BREAKFAST. 06/16/21   Cresenzo, Cyndi Lennert, MD  fluticasone (FLONASE) 50 MCG/ACT nasal spray PLACE 1 SPRAY INTO BOTH NOSTRILS DAILY. 11/02/21   Jerre Simon, MD  furosemide (LASIX) 40 MG tablet TAKE 1 TABLET (40 MG TOTAL) BY MOUTH 2 (TWO) TIMES DAILY. 02/24/22   Jerre Simon, MD  ibuprofen (ADVIL) 200 MG tablet Take 600 mg by mouth every 6 (six) hours as needed for headache or moderate pain.    [provider]  isosorbide mononitrate (IMDUR) 30 MG 24 hr tablet TAKE 1 TABLET (30 MG TOTAL) BY MOUTH DAILY. 04/28/22   Jerre Simon, MD  lidocaine (LIDODERM) 5 % Place 1 patch onto the skin daily. Remove &  Discard patch within 12 hours or as directed by MD 12/01/21   Nicanor Alcon, April, MD  multivitamin (ONE-A-DAY MEN'S) TABS tablet Take 1 tablet by mouth daily with breakfast.    [provider]  pantoprazole (PROTONIX) 40 MG tablet TAKE 1 TABLET (40 MG TOTAL) BY MOUTH DAILY. 07/28/22   Jerre Simon, MD  potassium chloride SA (KLOR-CON M) 20 MEQ tablet TAKE 2 TABLETS (40 MEQ TOTAL) BY MOUTH DAILY. 07/27/22   Quintella Reichert, MD  pregabalin (LYRICA) 100 MG capsule TAKE ONE CAPSULE BY MOUTH THREE TIMES DAILY 04/28/22   Jerre Simon, MD  rosuvastatin (CRESTOR) 10 MG tablet TAKE 1 TABLET (10 MG TOTAL) BY MOUTH DAILY. 07/27/22 07/27/23  Maeola Harman, MD  Skin Protectants, Misc. (MINERIN CREME) CREA Apply on affected area twice daily 01/19/22   Jerre Simon, MD  Tafamidis (VYNDAMAX) 61 MG CAPS TAKE 1 CAPSULE BY MOUTH DAILY. 11/19/21 11/19/22  [name], Bevelyn Buckles, MD  triamcinolone ointment (KENALOG) 0.5 % Apply 1 Application topically 2 (two) times daily. 01/18/22   Jerre Simon, MD  VENTOLIN HFA 108 [phone] Base) MCG/ACT inhaler INHALE 2 PUFFS INTO THE LUNGS EVERY 6 (SIX) HOURS AS NEEDED FOR WHEEZING OR SHORTNESS OF BREATH. 07/01/22   Jerre Simon, MD  Vitamin A 2400 MCG (8000 UT) TABS Take 1 tablet by mouth daily. 05/21/21   [name], Bevelyn Buckles, MD  vutrisiran sodium (AMVUTTRA) 25 MG/0.5ML syringe Inject 0.5 mLs (25 mg total) into the skin every 3 (three) months. 05/01/21   [name], Bevelyn Buckles, MD      Allergies    Chantix [varenicline], Bupropion, Bactrim [sulfamethoxazole-trimethoprim], and Nicoderm [nicotine]    Review of Systems   Review of Systems  Skin:  Positive for color change.  All other systems reviewed and are negative.   Physical Exam Updated Vital Signs BP (!) 152/138   Pulse 79   Temp 97.7 F (36.5 C)   Resp 18   Ht 5\' 10"  (1.778 m)   Wt (!) 138.3 kg   SpO2 100%   BMI 43.76 kg/m  Physical Exam Vitals and nursing note reviewed.  Constitutional:      General: He is not in acute distress.    Appearance: He is well-developed.  HENT:     Head: Normocephalic and atraumatic.  Eyes:     Conjunctiva/sclera: Conjunctivae normal.  Cardiovascular:     Rate and Rhythm: Normal rate and regular rhythm.     Heart sounds: No murmur heard. Pulmonary:     Effort: Pulmonary effort is normal. No respiratory distress.     Breath sounds: Normal breath sounds.  Abdominal:     Palpations: Abdomen is soft.     Tenderness: There is no abdominal tenderness.  Musculoskeletal:        General: No swelling.     Cervical back: Neck supple.  Skin:    General: Skin is warm and dry.     Capillary Refill: Capillary refill  takes less than 2 seconds.     Findings: Erythema present.     Comments: 9 x 3 cm area of erythema to anterior shin  Neurological:     Mental Status: He is alert.  Psychiatric:        Mood and Affect: Mood normal.     ED Results / Procedures / Treatments   Labs (all labs ordered are listed, but only abnormal results are displayed) Labs Reviewed - No data to display  EKG None  Radiology No results found.  Procedures Procedures  Medications Ordered in ED Medications - No data to display  ED Course/ Medical Decision Making/ A&P  Medical Decision Making  62 year old male presents to ED for evaluation.  Please see HPI for further details.  On examination patient is a 9 x 3 cm area of erythema to anterior shin.  Patient is afebrile and nontachycardic.  His lung sounds are clear bilaterally and he is not hypoxic on room air.  Abdomen soft and compressible throughout.  Patient presentation consistent with cellulitis.  Patient denies any systemic signs of illness.  His vital signs are reassuring.  He states that he is compliant on blood thinning medications so patient is very low chance of DVT.  The patient denies chest pain or shortness of breath.  The patient is requesting antibiotics.  I feel it is appropriate.  I will place the patient on 7 days of Keflex which she will take 3 times daily.  He will follow-up with his PCP in 5 days for reevaluation.  He was advised to come into the ED with any new or worsening signs or symptoms which were gone over in detail.  The patient voiced understanding of my instructions.  He had all of his questions answered to his satisfaction.  He is stable to discharge home at this time.   Final Clinical Impression(s) / ED Diagnoses Final diagnoses:  Cellulitis of left lower extremity    Rx / DC Orders ED Discharge Orders          Ordered    cephALEXin (KEFLEX) 500 MG capsule  3 times daily        08/02/22 1426              Clent Ridges 08/02/22 1426    Mardene Sayer, MD 08/02/22 [phone]

## 2022-08-02 NOTE — ED Notes (Signed)
Attempted to d/c pt. Unable to locate

## 2022-08-03 DIAGNOSIS — M5136 Other intervertebral disc degeneration, lumbar region: Secondary | ICD-10-CM | POA: Diagnosis not present

## 2022-08-04 ENCOUNTER — Ambulatory Visit (HOSPITAL_BASED_OUTPATIENT_CLINIC_OR_DEPARTMENT_OTHER): Payer: Medicaid Other | Admitting: Physical Therapy

## 2022-08-04 ENCOUNTER — Other Ambulatory Visit: Payer: Self-pay

## 2022-08-04 ENCOUNTER — Telehealth: Payer: Self-pay | Admitting: *Deleted

## 2022-08-04 DIAGNOSIS — M5136 Other intervertebral disc degeneration, lumbar region: Secondary | ICD-10-CM | POA: Diagnosis not present

## 2022-08-04 NOTE — Transitions of Care (Post Inpatient/ED Visit) (Signed)
08/04/2022  Name: Russell Collier MRN: [id number] DOB: Oct 23, 1960  Today's TOC FU Call Status: Today's TOC FU Call Status:: Successful TOC FU Call Competed TOC FU Call Complete Date: 08/04/22  Transition Care Management Follow-up Telephone Call Date of Discharge: 08/02/22 Discharge Facility: [facility]) Type of Discharge: Emergency Department Reason for ED Visit: Other: (cellulitis) How have you been since you were released from the hospital?: Same Any questions or concerns?: No  Items Reviewed: Did you receive and understand the discharge instructions provided?: Yes Medications obtained,verified, and reconciled?: Yes (Medications Reviewed) Any new allergies since your discharge?: No Dietary orders reviewed?: NA Do you have support at home?: Yes Name of Support/Comfort Primary Source: PCS 7 days a week  Medications Reviewed Today: Medications Reviewed Today     Reviewed by Heidi Dach, RN (Registered Nurse) on 08/04/22 at 1322  Med List Status: <None>   Medication Order Taking? Sig Documenting Provider Last Dose Status Informant  acetaminophen (TYLENOL) 325 MG tablet [id number] Yes Take 2 tablets (650 mg total) by mouth every 6 (six) hours as needed for mild pain or moderate pain. Darral Dash, DO Taking Active Self  albuterol (PROVENTIL) (2.5 MG/3ML) 0.083% nebulizer solution [id number] Yes Take 3 mLs (2.5 mg total) by nebulization every 6 (six) hours as needed for wheezing or shortness of breath. Celedonio Savage, MD Taking Active Self  ANORO ELLIPTA 62.5-25 MCG/ACT AEPB [id number] Yes INHALE 1 PUFF INTO THE LUNGS DAILY AS NEEDED. Jerre Simon, MD Taking Active   betamethasone valerate ointment (VALISONE) 0.1 % [id number] Yes Apply 1 Application topically 2 (two) times daily. Jerre Simon, MD Taking Active   cephALEXin [facility]) 500 MG capsule [id number]  Take 1 capsule (500 mg total) by mouth 3 (three) times daily. Al Decant, PA-C  Active   clopidogrel (PLAVIX)  75 MG tablet [id number] Yes TAKE 1 TABLET (75 MG TOTAL) BY MOUTH DAILY. Maeola Harman, MD Taking Active   ELIQUIS 2.5 MG TABS tablet [id number] Yes TAKE 1 TABLET (2.5 MG TOTAL) BY MOUTH 2 (TWO) TIMES DAILY. Jerre Simon, MD Taking Active   enalapril (VASOTEC) 10 MG tablet [id number] Yes Take 1 tablet (10 mg total) by mouth at bedtime. [name], Bevelyn Buckles, MD Taking Active   FARXIGA 10 MG TABS tablet [id number] Yes TAKE 1 TABLET (10 MG TOTAL) BY MOUTH DAILY BEFORE BREAKFAST. Celedonio Savage, MD Taking Active Self  fluticasone (FLONASE) 50 MCG/ACT nasal spray [id number] Yes PLACE 1 SPRAY INTO BOTH NOSTRILS DAILY. Jerre Simon, MD Taking Active   furosemide (LASIX) 40 MG tablet [id number] Yes TAKE 1 TABLET (40 MG TOTAL) BY MOUTH 2 (TWO) TIMES DAILY. Jerre Simon, MD Taking Active   ibuprofen (ADVIL) 200 MG tablet [id number] Yes Take 600 mg by mouth every 6 (six) hours as needed for headache or moderate pain. [provider] Taking Active Self  isosorbide mononitrate (IMDUR) 30 MG 24 hr tablet [id number] Yes TAKE 1 TABLET (30 MG TOTAL) BY MOUTH DAILY. Jerre Simon, MD Taking Active   lidocaine (LIDODERM) 5 % [id number] Yes Place 1 patch onto the skin daily. Remove & Discard patch within 12 hours or as directed by MD Nicanor Alcon, April, MD Taking Active   multivitamin (ONE-A-DAY MEN'S) TABS tablet [id number] Yes Take 1 tablet by mouth daily with breakfast. [provider] Taking Active Self  pantoprazole (PROTONIX) 40 MG tablet [id number] Yes TAKE 1 TABLET (40 MG TOTAL) BY MOUTH DAILY. Jerre Simon, MD Taking Active   potassium chloride SA (KLOR-CON M) 20 MEQ tablet  [id number] Yes TAKE 2 TABLETS (40 MEQ TOTAL) BY MOUTH DAILY. Quintella Reichert, MD Taking Active   pregabalin (LYRICA) 100 MG capsule [id number] Yes TAKE ONE CAPSULE BY MOUTH THREE TIMES DAILY Jerre Simon, MD Taking Active   rosuvastatin (CRESTOR) 10 MG tablet [id number] Yes TAKE 1 TABLET (10 MG TOTAL) BY MOUTH DAILY. Maeola Harman, MD Taking Active   Skin Protectants, Misc. [facility] CREME) CREA [id number] Yes Apply on affected area twice daily Jerre Simon, MD Taking Active   Tafamidis [facility]) 61 MG CAPS [id number] Yes TAKE 1 CAPSULE BY MOUTH DAILY. [name], Bevelyn Buckles, MD Taking Active   triamcinolone ointment (KENALOG) 0.5 % [id number] Yes Apply 1 Application topically 2 (two) times daily. Jerre Simon, MD Taking Active   VENTOLIN HFA [phone] Base) MCG/ACT inhaler [id number] Yes INHALE 2 PUFFS INTO THE LUNGS EVERY 6 (SIX) HOURS AS NEEDED FOR WHEEZING OR SHORTNESS OF BREATH. Jerre Simon, MD Taking Active   Vitamin A 2400 MCG (8000 UT) TABS [id number] Yes Take 1 tablet by mouth daily. [name], Bevelyn Buckles, MD Taking Active Self           Med Note Antony Madura, Dyane Dustman Jun 28, 2021  9:38 PM)    vutrisiran sodium (AMVUTTRA) 25 MG/0.5ML syringe [id number] Yes Inject 0.5 mLs (25 mg total) into the skin every 3 (three) months. [name], Bevelyn Buckles, MD Taking Active Self            Home Care and Equipment/Supplies: Were Home Health Services Ordered?: NA Any new equipment or medical supplies ordered?: NA  Functional Questionnaire: Do you need assistance with bathing/showering or dressing?: No Do you need assistance with meal preparation?: No Do you need assistance with eating?: No Do you have difficulty maintaining continence: No Do you need assistance with getting out of bed/getting out of a chair/moving?: No Do you have difficulty managing or taking your medications?: No  Follow up appointments reviewed: PCP Follow-up appointment confirmed?: Yes Date of PCP follow-up appointment?: 08/06/22 Follow-up Provider: PCP Specialist Hospital Follow-up appointment confirmed?: NA Do you need transportation to your follow-up appointment?: No Do you understand care options if your condition(s) worsen?: Yes-patient verbalized understanding  SDOH Interventions Today    Flowsheet Row Most Recent Value  SDOH  Interventions   Transportation Interventions Intervention Not Indicated      Previously enrolled in CM. RNCM reminded patient to reach out with new needs/barriers to managing his health. Patient voiced understanding.  Estanislado Emms RN, BSN [place]  Managed [facility] RN Care Coordinator [phone]

## 2022-08-05 DIAGNOSIS — M5136 Other intervertebral disc degeneration, lumbar region: Secondary | ICD-10-CM | POA: Diagnosis not present

## 2022-08-06 ENCOUNTER — Ambulatory Visit: Payer: Medicaid Other | Admitting: Family Medicine

## 2022-08-06 DIAGNOSIS — M5136 Other intervertebral disc degeneration, lumbar region: Secondary | ICD-10-CM | POA: Diagnosis not present

## 2022-08-09 ENCOUNTER — Ambulatory Visit (HOSPITAL_BASED_OUTPATIENT_CLINIC_OR_DEPARTMENT_OTHER): Payer: Medicaid Other

## 2022-08-09 ENCOUNTER — Other Ambulatory Visit: Payer: Self-pay | Admitting: Student

## 2022-08-09 DIAGNOSIS — M5136 Other intervertebral disc degeneration, lumbar region: Secondary | ICD-10-CM | POA: Diagnosis not present

## 2022-08-10 ENCOUNTER — Ambulatory Visit (INDEPENDENT_AMBULATORY_CARE_PROVIDER_SITE_OTHER): Payer: Medicaid Other | Admitting: Family Medicine

## 2022-08-10 ENCOUNTER — Ambulatory Visit
Admission: RE | Admit: 2022-08-10 | Discharge: 2022-08-10 | Disposition: A | Discharge status: Home / Self Care | Payer: Medicaid Other | Source: Ambulatory Visit | Attending: Family Medicine | Admitting: Family Medicine

## 2022-08-10 ENCOUNTER — Encounter: Payer: Self-pay | Admitting: Family Medicine

## 2022-08-10 VITALS — BP 118/70 | HR 79 | Ht 70.0 in | Wt 305.0 lb

## 2022-08-10 DIAGNOSIS — L03116 Cellulitis of left lower limb: Secondary | ICD-10-CM | POA: Diagnosis present

## 2022-08-10 DIAGNOSIS — R600 Localized edema: Secondary | ICD-10-CM | POA: Diagnosis not present

## 2022-08-10 DIAGNOSIS — M1712 Unilateral primary osteoarthritis, left knee: Secondary | ICD-10-CM | POA: Diagnosis not present

## 2022-08-10 DIAGNOSIS — M79651 Pain in right thigh: Secondary | ICD-10-CM

## 2022-08-10 DIAGNOSIS — M5136 Other intervertebral disc degeneration, lumbar region: Secondary | ICD-10-CM | POA: Diagnosis not present

## 2022-08-10 DIAGNOSIS — M25462 Effusion, left knee: Secondary | ICD-10-CM | POA: Diagnosis not present

## 2022-08-10 MED ORDER — LANCET DEVICE MISC: 1.0000 | Freq: Three times a day (TID) | 11 refills | Status: AC

## 2022-08-10 MED ORDER — LANCETS MISC. MISC: 1.0000 | Freq: Three times a day (TID) | 11 refills | Status: AC

## 2022-08-10 MED ORDER — BLOOD GLUCOSE TEST VI STRP: 1.0000 | ORAL_STRIP | Freq: Three times a day (TID) | 11 refills | Status: AC

## 2022-08-10 MED ORDER — BLOOD GLUCOSE MONITORING SUPPL DEVI: 1.0000 | Freq: Three times a day (TID) | 0 refills | Status: DC

## 2022-08-10 NOTE — Progress Notes (Signed)
SUBJECTIVE:   CHIEF COMPLAINT / HPI:   Cellulitis follow up Seen in ED 6/10 Prescribed Keflex 3 times daily x 7 days Has finished abx, but area still red and tender No fevers or chills Normal PO and UOP Does have PAD and hx poor wound healin  Right thigh pain One week ago, was walking and felt a pop of his left inferior posterior thigh. Pain and swelling ever since. Difficult to walk. He wonders if he injured his hamstring. No bruising.    PERTINENT  PMH / PSH:  Patient Active Problem List   Diagnosis Date Noted   Acute pain of right thigh 08/13/2022   HSV (herpes simplex virus) infection 01/18/2022   Unspecified skin changes 08/21/2021   Pulmonary hypertension, primary (HCC)    Cellulitis 07/09/2021   Urinary tract infection without hematuria    Sepsis (HCC) 06/28/2021   Community acquired pneumonia    Gout 02/26/2021   Skin tag 02/12/2021   Skin cyst 02/12/2021   Anticoagulant long-term use 01/29/2021   Dermoid cyst of skin of nose 12/24/2020   Strain of right calf muscle 07/01/2020   Status post lumbar spinal fusion 06/06/2020   Impaired mobility and ADLs 04/23/2020   Midline low back pain 04/23/2020   Neuropathy, 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   COPD exacerbation (HCC) 03/20/2020   Type 2 diabetes mellitus with diabetic peripheral angiopathy without gangrene, without long-term current use of insulin (HCC) 03/20/2020   Coagulation defect (HCC) 12/11/2019   Fatigue 12/10/2019   Frequent epistaxis 10/30/2019   Disc degeneration, 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   Ganglion of right knee 09/27/2019   Lumbar radiculopathy 09/27/2019   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HCC)    Respiratory failure (HCC) 07/27/2019   Primary osteoarthritis of right knee 07/24/2019   Chronic pain 05/31/2019   AKI (acute kidney injury) (HCC) 05/31/2019   Pain due to onychomycosis of toenails of both feet 05/16/2019   Chronic knee pain 04/16/2019   Diabetes (HCC) 04/16/2019   Esophageal reflux 02/06/2019    Chronic skin ulcer of lower leg (HCC) 12/21/2018   S/P drug eluting coronary stent placement    Coronary artery disease involving native coronary artery of native heart with unstable angina pectoris (HCC) 12/20/2018   Unstable angina (HCC)    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   Acute on chronic diastolic heart failure (HCC)    Shortness of breath    Idiopathic peripheral neuropathy    Chronic diastolic heart failure (HCC)    Abnormal ankle brachial index (ABI)    History of cocaine abuse (HCC) 08/20/2018   Marijuana use 08/20/2018   Morbid obesity with BMI of 40.0-44.9, adult (HCC) 08/20/2018   Dyspepsia    Morbid obesity (HCC)    OSA on CPAP    CAD (coronary artery disease) 03/30/2018   Tobacco abuse 02/03/2018   Hyperlipidemia 02/03/2018   Acute respiratory failure with hypoxia (HCC) 02/02/2018   Chronic congestive heart failure (HCC)    Keratoconjunctivitis sicca of left eye not specified as Sjogren's 09/06/2017   Long term current use of oral hypoglycemic drug 09/06/2017   Mechanical ectropion of left lower eyelid 09/06/2017   Nuclear sclerotic cataract of both eyes 09/06/2017   Refractive amblyopia, left 09/06/2017   Type 2 diabetes mellitus without complication, without long-term current use of insulin (HCC) 09/06/2017   Hyperopia of both eyes with astigmatism and presbyopia 09/06/2017   Essential hypertension    Neuropathy      OBJECTIVE:   BP 118/70   Pulse 79   Ht  5\' 10"  (1.778 m)   Wt (!) 305 lb (138.3 kg)   SpO2 98%   BMI 43.76 kg/m    PHQ-9:     03/08/2022    4:57 PM 02/23/2022    4:32 PM 11/25/2021   11:30 AM  Depression screen PHQ 2/9  Decreased Interest 0 0 0  Down, Depressed, Hopeless 0 0 0  PHQ - 2 Score 0 0 0  Altered sleeping 0 0 0  Tired, decreased energy 0 0 1  Change in appetite 0 0 3  Feeling bad or failure about yourself  0 0 0  Trouble concentrating 0 0 0  Moving slowly or fidgety/restless 0 0 0  Suicidal thoughts 0 0 0  PHQ-9 Score 0 0 4   Difficult doing work/chores Not difficult at all Not difficult at all Not difficult at all    Physical Exam General: Awake, alert, oriented, no acute distress Respiratory: Unlabored respirations, speaking in full sentences, no respiratory distress Extremities: Moving all extremities spontaneously, abnormal gait with limp, right posterior knee with effusion present superior to knee with pinpoint TTP over hamstring tendons Skin: Left anterior shin with circular mildly erythematous lesion, mild TTP, without warmth or drainage, no fluctuance,old healed scarred ulcers present further down distal shin Neuro: Cranial nerves II through X grossly intact Psych: Normal insight and judgement   ASSESSMENT/PLAN:   Acute pain of right thigh Acute, affects gait. Certainly is superior to knee and does not seem to involve the knee. Is limited to posterior inferior thigh over distal hamstrings. Given sudden onset with felt "pop" quality, I do wonder if he injured his tendons. Will send to sports med for further evaluation and possible ultrasound.  Cellulitis Suspect cellulitic area of left shin still tender and mildly red given known PAD and poor wound healing. NO signs of ongoing infection. Will obtain XR to r/o osseous involvement.Short of osseous abnormalities possibly indicating osteomyelitis, would not pursue additional antibiotic treatment unless he becomes symptomatic again.      Fayette Pho, MD [facility]

## 2022-08-10 NOTE — Patient Instructions (Signed)
It was wonderful to see you today. Thank you for allowing me to be a part of your care. Below is a short summary of what we discussed at your visit today:  Left leg cellulitis Go for x-rays. You can walk in any time during normal business hours.  As long as you are improving, I am not sure we need any more antibiotics.  [facility] [address] [place], [place], [place] [address] [phone] Mon-Fri 8-5     Right thigh concern I am concerned for a ligament injury.  I have referred you to sports medicine for evaluation of your right hamstring.  Someone from their office should be calling you in 1 to 2 weeks to schedule an appointment.  If you do not hear from them, let us know. We may need to nudge along the referral.     Please bring all of your medications to every appointment! If you have any questions or concerns, please do not hesitate to contact us via phone or MyChart message.   Fayette Pho, MD

## 2022-08-11 ENCOUNTER — Ambulatory Visit (HOSPITAL_BASED_OUTPATIENT_CLINIC_OR_DEPARTMENT_OTHER): Payer: Medicaid Other | Admitting: Physical Therapy

## 2022-08-11 DIAGNOSIS — M5136 Other intervertebral disc degeneration, lumbar region: Secondary | ICD-10-CM | POA: Diagnosis not present

## 2022-08-12 DIAGNOSIS — M5136 Other intervertebral disc degeneration, lumbar region: Secondary | ICD-10-CM | POA: Diagnosis not present

## 2022-08-13 ENCOUNTER — Ambulatory Visit: Payer: Medicaid Other | Admitting: Sports Medicine

## 2022-08-13 ENCOUNTER — Encounter: Payer: Self-pay | Admitting: Family Medicine

## 2022-08-13 ENCOUNTER — Other Ambulatory Visit: Payer: Self-pay

## 2022-08-13 VITALS — BP 121/59 | Ht 70.5 in | Wt 305.0 lb

## 2022-08-13 DIAGNOSIS — M5136 Other intervertebral disc degeneration, lumbar region: Secondary | ICD-10-CM | POA: Diagnosis not present

## 2022-08-13 DIAGNOSIS — M79604 Pain in right leg: Secondary | ICD-10-CM | POA: Diagnosis not present

## 2022-08-13 DIAGNOSIS — M79651 Pain in right thigh: Secondary | ICD-10-CM | POA: Insufficient documentation

## 2022-08-13 NOTE — Progress Notes (Signed)
  Russell Collier - 62 y.o. 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  He reports pain in the posterior aspect of the thigh for about 3 weeks.  No inciting injury or trauma.  Is a constant ache that is along the distal aspect of the hamstring laterally.  He has tried propping up and taking ibuprofen which is helped.  The pain is significantly improved over the last couple weeks but just wanted to have it checked out.  Denies any swelling in the knee or leg.  Denies any numbness or tingling.  He is very involved with physical therapy and aquatic therapy for his other chronic medical conditions.  ROS:     See HPI  PERTINENT  PMH / PSH FH / / SH:  Past Medical, Surgical, Social, and Family History Reviewed & Updated in the EMR.  Pertinent findings include:  none  OBJECTIVE: BP (!) 121/59   Ht 5' 10.5" (1.791 m)   Wt (!) 305 lb (138.3 kg)   BMI 43.14 kg/m   Physical Exam:  Vital signs are reviewed.  GEN: Alert and oriented, NAD Pulm: Breathing unlabored PSY: normal mood, congruent affect  MSK: R Knee - no overlying skin changes.  Scars are well-healed.  Tender to palpation of the medial joint line.  Nontender palpation of the lateral joint line.  Range of motion 0 to 120 degrees.  5/5 strength with resisted knee extension and flexion.  Tender to palpation at the distal hamstring laterally.  Nontender palpation of the fibular head.  ULTRASOUND: MSK ultrasound knee: Images were obtained both in the transverse and longitudinal plane. Patellar and quadriceps tendons were well visualized with no abnormalities. No effusion. Medial and lateral menisci were well visualized.  Degenerative changes seen in  both menisci bilaterally. Popliteal cyst was evaluated in both the transverse and longitudinal planes. No obvious Baker's cyst  Distal hamstring tendons were visualized in long and short axis with no obvious disruption of fibers or hypoechoic changes.  Impression: Degenerative changes at the knee with no effusion or tear of distal hamstring   ASSESSMENT & PLAN:  1.  Right distal hamstring strain  -Reassurance was provided to the patient.  Will treat this with compression with an Ace wrap.  Will give him a new prescription to continue his involvement with physical therapy.  I also recommended ice and elevation.  He can follow-up as needed for this.  2. Bilat Knee OA -Chronic history of bilateral knee OA.  He does not want to have knee replacement.  I discussed with him options of cortisone and/or gel injections and he would like to consider these.  He can follow-up as needed if he wants to try injections.  Arvella Nigh, MD PGY-4, Sports Medicine Fellow [facility]  Addendum:  I was the preceptor for this visit and available for immediate consultation.  Norton Blizzard MD Marrianne Mood

## 2022-08-13 NOTE — Assessment & Plan Note (Signed)
Acute, affects gait. Certainly is superior to knee and does not seem to involve the knee. Is limited to posterior inferior thigh over distal hamstrings. Given sudden onset with felt "pop" quality, I do wonder if he injured his tendons. Will send to sports med for further evaluation and possible ultrasound.

## 2022-08-13 NOTE — Assessment & Plan Note (Signed)
Suspect cellulitic area of left shin still tender and mildly red given known PAD and poor wound healing. NO signs of ongoing infection. Will obtain XR to r/o osseous involvement.Short of osseous abnormalities possibly indicating osteomyelitis, would not pursue additional antibiotic treatment unless he becomes symptomatic again.
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
## 2022-08-16 ENCOUNTER — Ambulatory Visit (HOSPITAL_BASED_OUTPATIENT_CLINIC_OR_DEPARTMENT_OTHER): Payer: Medicaid Other | Admitting: Physical Therapy

## 2022-08-16 ENCOUNTER — Encounter: Payer: Self-pay | Admitting: Family Medicine

## 2022-08-16 DIAGNOSIS — M6281 Muscle weakness (generalized): Secondary | ICD-10-CM

## 2022-08-16 DIAGNOSIS — M79671 Pain in right foot: Secondary | ICD-10-CM

## 2022-08-16 DIAGNOSIS — R262 Difficulty in walking, not elsewhere classified: Secondary | ICD-10-CM

## 2022-08-16 DIAGNOSIS — M5459 Other low back pain: Secondary | ICD-10-CM

## 2022-08-16 DIAGNOSIS — M25672 Stiffness of left ankle, not elsewhere classified: Secondary | ICD-10-CM

## 2022-08-16 DIAGNOSIS — M25671 Stiffness of right ankle, not elsewhere classified: Secondary | ICD-10-CM

## 2022-08-16 DIAGNOSIS — M5136 Other intervertebral disc degeneration, lumbar region: Secondary | ICD-10-CM | POA: Diagnosis not present

## 2022-08-16 NOTE — Progress Notes (Signed)
No evidence of underlying osteo. Suspect slowly healing cellulitis due to known PAD. No further antibiotics at this time. Will reassess if develops signs of new infection.  Fayette Pho, MD

## 2022-08-17 ENCOUNTER — Encounter (HOSPITAL_BASED_OUTPATIENT_CLINIC_OR_DEPARTMENT_OTHER): Payer: Self-pay | Admitting: Physical Therapy

## 2022-08-17 DIAGNOSIS — M5136 Other intervertebral disc degeneration, lumbar region: Secondary | ICD-10-CM | POA: Diagnosis not present

## 2022-08-18 ENCOUNTER — Ambulatory Visit (HOSPITAL_BASED_OUTPATIENT_CLINIC_OR_DEPARTMENT_OTHER): Payer: Medicaid Other | Admitting: Physical Therapy

## 2022-08-18 ENCOUNTER — Encounter (HOSPITAL_BASED_OUTPATIENT_CLINIC_OR_DEPARTMENT_OTHER): Payer: Self-pay | Admitting: Physical Therapy

## 2022-08-18 DIAGNOSIS — M5136 Other intervertebral disc degeneration, lumbar region: Secondary | ICD-10-CM | POA: Diagnosis not present

## 2022-08-18 DIAGNOSIS — S76311A Strain of muscle, fascia and tendon of the posterior muscle group at thigh level, right thigh, initial encounter: Secondary | ICD-10-CM

## 2022-08-18 DIAGNOSIS — M25672 Stiffness of left ankle, not elsewhere classified: Secondary | ICD-10-CM

## 2022-08-18 DIAGNOSIS — M5459 Other low back pain: Secondary | ICD-10-CM

## 2022-08-18 DIAGNOSIS — M25671 Stiffness of right ankle, not elsewhere classified: Secondary | ICD-10-CM

## 2022-08-18 DIAGNOSIS — M6281 Muscle weakness (generalized): Secondary | ICD-10-CM

## 2022-08-18 DIAGNOSIS — R262 Difficulty in walking, not elsewhere classified: Secondary | ICD-10-CM
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
## 2022-08-19 DIAGNOSIS — M5136 Other intervertebral disc degeneration, lumbar region: Secondary | ICD-10-CM | POA: Diagnosis not present

## 2022-08-20 DIAGNOSIS — M5136 Other intervertebral disc degeneration, lumbar region: Secondary | ICD-10-CM | POA: Diagnosis not present

## 2022-08-23 ENCOUNTER — Ambulatory Visit (HOSPITAL_BASED_OUTPATIENT_CLINIC_OR_DEPARTMENT_OTHER): Payer: Medicaid Other | Admitting: Physical Therapy

## 2022-08-23 DIAGNOSIS — M5136 Other intervertebral disc degeneration, lumbar region: Secondary | ICD-10-CM | POA: Diagnosis not present

## 2022-08-24 DIAGNOSIS — M5136 Other intervertebral disc degeneration, lumbar region: Secondary | ICD-10-CM | POA: Diagnosis not present

## 2022-08-25 DIAGNOSIS — M5136 Other intervertebral disc degeneration, lumbar region: Secondary | ICD-10-CM | POA: Diagnosis not present

## 2022-08-26 DIAGNOSIS — M5136 Other intervertebral disc degeneration, lumbar region: Secondary | ICD-10-CM | POA: Diagnosis not present

## 2022-08-27 DIAGNOSIS — M5136 Other intervertebral disc degeneration, lumbar region: Secondary | ICD-10-CM | POA: Diagnosis not present

## 2022-08-30 ENCOUNTER — Other Ambulatory Visit (HOSPITAL_COMMUNITY): Payer: Self-pay

## 2022-08-30 DIAGNOSIS — M5136 Other intervertebral disc degeneration, lumbar region: Secondary | ICD-10-CM | POA: Diagnosis not present

## 2022-08-31 DIAGNOSIS — M5136 Other intervertebral disc degeneration, lumbar region: Secondary | ICD-10-CM | POA: Diagnosis not present

## 2022-09-01 DIAGNOSIS — M5136 Other intervertebral disc degeneration, lumbar region: Secondary | ICD-10-CM | POA: Diagnosis not present

## 2022-09-02 ENCOUNTER — Other Ambulatory Visit: Payer: Self-pay

## 2022-09-02 ENCOUNTER — Ambulatory Visit (INDEPENDENT_AMBULATORY_CARE_PROVIDER_SITE_OTHER): Payer: Medicaid Other | Admitting: Podiatry

## 2022-09-02 ENCOUNTER — Encounter: Payer: Self-pay | Admitting: Podiatry

## 2022-09-02 DIAGNOSIS — B351 Tinea unguium: Secondary | ICD-10-CM | POA: Diagnosis not present

## 2022-09-02 DIAGNOSIS — M79674 Pain in right toe(s): Secondary | ICD-10-CM | POA: Diagnosis not present

## 2022-09-02 DIAGNOSIS — I739 Peripheral vascular disease, unspecified: Secondary | ICD-10-CM

## 2022-09-02 DIAGNOSIS — M79675 Pain in left toe(s): Secondary | ICD-10-CM

## 2022-09-02 DIAGNOSIS — M5136 Other intervertebral disc degeneration, lumbar region: Secondary | ICD-10-CM | POA: Diagnosis not present

## 2022-09-02 DIAGNOSIS — E1151 Type 2 diabetes mellitus with diabetic peripheral angiopathy without gangrene: Secondary | ICD-10-CM

## 2022-09-02 MED ORDER — PREGABALIN 100 MG PO CAPS: 100.0000 mg | ORAL_CAPSULE | Freq: Three times a day (TID) | ORAL | 0 refills | Status: DC
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
## 2022-09-03 DIAGNOSIS — M5136 Other intervertebral disc degeneration, lumbar region: Secondary | ICD-10-CM | POA: Diagnosis not present

## 2022-09-06 ENCOUNTER — Other Ambulatory Visit (HOSPITAL_COMMUNITY): Payer: Self-pay

## 2022-09-06 DIAGNOSIS — M5136 Other intervertebral disc degeneration, lumbar region: Secondary | ICD-10-CM | POA: Diagnosis not present

## 2022-09-07 DIAGNOSIS — M5136 Other intervertebral disc degeneration, lumbar region: Secondary | ICD-10-CM | POA: Diagnosis not present

## 2022-09-08 ENCOUNTER — Ambulatory Visit (HOSPITAL_COMMUNITY)
Admission: RE | Admit: 2022-09-08 | Discharge: 2022-09-08 | Disposition: A | Discharge status: Home / Self Care | Payer: Medicaid Other | Source: Ambulatory Visit | Attending: Cardiology | Admitting: Cardiology

## 2022-09-08 DIAGNOSIS — M5136 Other intervertebral disc degeneration, lumbar region: Secondary | ICD-10-CM | POA: Diagnosis not present

## 2022-09-08 DIAGNOSIS — G63 Polyneuropathy in diseases classified elsewhere: Secondary | ICD-10-CM

## 2022-09-08 DIAGNOSIS — E851 Neuropathic heredofamilial amyloidosis: Secondary | ICD-10-CM

## 2022-09-08 MED ORDER — VUTRISIRAN SODIUM 25 MG/0.5ML ~~LOC~~ SOSY
25.0000 mg | PREFILLED_SYRINGE | Freq: Once | SUBCUTANEOUS | Status: AC
  Administered 2022-09-08: 25 mg via SUBCUTANEOUS
  Filled 2022-09-08: qty 0.5

## 2022-09-08 NOTE — Progress Notes (Signed)
    Advanced Heart Failure Clinic Note  PCP: Jerre Simon, MD PCP-Cardiologist: Armanda Magic, MD  Neurology: Dr Allena Katz HF Cardiology: Dr. Gala Romney  HPI:  Russell Collier is a 62 y.o. 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    Echo 01/2018, EF 55-60%, nonobstructive cardiac cath 01/2018 (20% prox LAD, 60% mid Cx, 50% post atrio lesion) h/o bradycardia w/ ? 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    PYP scan 09/2018 and was an equivocal study. No visual increase in PYP uptake, but ratio was between 1-1.5. Based clinical suspicion, he was ordered to undergo genetic testing and results + for TTR gene.   He was seen by neurology, neuropathy associated with TTR.    Admitted 07/27/19 with atypical chest pain and wheezing. Troponins were low and flat, EKG reassuring. A CTA was performed, which showed a possible segmental PE. Since he does have a history of PE, he was continued on IV heparin and transitioned to Eliquis on 07/28/19. Echo 07/27/19 EF 55% RV normal.    At his HF follow up 12/2019 Eliquis was decreased due to frequent nose bleeds, mobility limited by back and knee pain.   S/p L3-S1 decompression 03/2020, uneventful hospitalization at [facility].   Graduated from paramedicine 09/2020.   Follow up with Neurology 03/2021, recommended starting Amvuttra and recommended restarting PT.   Echo 04/2021 EF 50-55% RV ok    Admitted 5/18-22/23 for RLE cellulitis. S/p RLE angiography with CO2, no intervention. New wound on LLE, and underwent LLE angiography with balloon angioplasty of left peroneal artery. Had LLE wound which is now healed.    At HF follow up 01/2022 Imdur discontinued and enalapril decreased to 10  mg nightly due to orthostasis.  Today he returns to HF clinic for injection of Amvuttra. Overall feeling well today. No contraindications to injection. Noted mild burning sensation in left arm during injection today, otherwise, tolerated well.    Assessment/Plan: 1. Familial TTR Cardiac Amyloidosis:  - PYP scan 09/2018 was equivocal. + genetic testing. Has TTR gene, heterozygous.  - He is on tafamadis and referred for family genetic testing. No family members with TTR.  - Based on PYP scan not thought to have extensive cardiac involvement at this point.  - He has numerous neuropathic symptoms. Followed by Dr. Nita Sickle in Neurology for neuropathy - Continue Tafamidis. 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 Patient tolerated injection well. Provided patient counseling on Amvuttra. Most common side effects are injection site reactions, arthralgias, dyspnea and vitamin A deficiency. Patient is aware to return to clinic every 3 months for repeat injection.   - Continue vitamin A supplement 8000 IU daily. Amvuttra decreases serum vitamin A levels.   Follow up 3 months for repeat Amvuttra injection.   Russell Collier, PharmD, BCPS, BCCP, CPP Heart Failure Clinic Pharmacist [phone]

## 2022-09-08 NOTE — Patient Instructions (Signed)
It was a pleasure seeing you today!  MEDICATIONS: -No medication changes today -Call if you have questions about your medications.   NEXT APPOINTMENT: Return to clinic in 3 months for repeat Amvuttra injection.  In general, to take care of your heart failure: -Limit your fluid intake to 2 Liters (half-gallon) per day.   -Limit your salt intake to ideally 2-3 grams (2000-3000 mg) per day. -Weigh yourself daily and record, and bring that "weight diary" to your next appointment.  (Weight gain of 2-3 pounds in 1 day typically means fluid weight.) -The medications for your heart are to help your heart and help you live longer.   -Please contact us before stopping any of your heart medications.  Call the clinic at [phone] with questions or to reschedule future appointments.  

## 2022-09-09 DIAGNOSIS — M5136 Other intervertebral disc degeneration, lumbar region: Secondary | ICD-10-CM | POA: Diagnosis not present

## 2022-09-10 DIAGNOSIS — M5136 Other intervertebral disc degeneration, lumbar region: Secondary | ICD-10-CM | POA: Diagnosis not present

## 2022-09-13 DIAGNOSIS — M5136 Other intervertebral disc degeneration, lumbar region: Secondary | ICD-10-CM | POA: Diagnosis not present

## 2022-09-14 DIAGNOSIS — M5136 Other intervertebral disc degeneration, lumbar region: Secondary | ICD-10-CM | POA: Diagnosis not present

## 2022-09-15 DIAGNOSIS — M5136 Other intervertebral disc degeneration, lumbar region: Secondary | ICD-10-CM | POA: Diagnosis not present

## 2022-09-16 DIAGNOSIS — M5136 Other intervertebral disc degeneration, lumbar region: Secondary | ICD-10-CM | POA: Diagnosis not present

## 2022-09-17 DIAGNOSIS — M5136 Other intervertebral disc degeneration, lumbar region: Secondary | ICD-10-CM | POA: Diagnosis not present

## 2022-09-20 ENCOUNTER — Ambulatory Visit (HOSPITAL_BASED_OUTPATIENT_CLINIC_OR_DEPARTMENT_OTHER): Payer: Medicaid Other | Admitting: Physical Therapy

## 2022-09-20 DIAGNOSIS — M5136 Other intervertebral disc degeneration, lumbar region: Secondary | ICD-10-CM | POA: Diagnosis not present

## 2022-09-21 DIAGNOSIS — M5136 Other intervertebral disc degeneration, lumbar region: Secondary | ICD-10-CM | POA: Diagnosis not present

## 2022-09-22 ENCOUNTER — Encounter (HOSPITAL_BASED_OUTPATIENT_CLINIC_OR_DEPARTMENT_OTHER): Payer: Self-pay | Admitting: Physical Therapy

## 2022-09-22 ENCOUNTER — Ambulatory Visit (HOSPITAL_BASED_OUTPATIENT_CLINIC_OR_DEPARTMENT_OTHER): Payer: Medicaid Other | Attending: Family Medicine | Admitting: Physical Therapy

## 2022-09-22 DIAGNOSIS — M5459 Other low back pain: Secondary | ICD-10-CM | POA: Diagnosis present

## 2022-09-22 DIAGNOSIS — R262 Difficulty in walking, not elsewhere classified: Secondary | ICD-10-CM | POA: Diagnosis present

## 2022-09-22 DIAGNOSIS — M5136 Other intervertebral disc degeneration, lumbar region: Secondary | ICD-10-CM | POA: Diagnosis not present

## 2022-09-22 DIAGNOSIS — M6281 Muscle weakness (generalized): Secondary | ICD-10-CM | POA: Insufficient documentation
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
## 2022-09-23 DIAGNOSIS — M5136 Other intervertebral disc degeneration, lumbar region: Secondary | ICD-10-CM | POA: Diagnosis not present

## 2022-09-24 DIAGNOSIS — M5136 Other intervertebral disc degeneration, lumbar region: Secondary | ICD-10-CM | POA: Diagnosis not present

## 2022-09-27 DIAGNOSIS — M5136 Other intervertebral disc degeneration, lumbar region: Secondary | ICD-10-CM | POA: Diagnosis not present

## 2022-09-28 ENCOUNTER — Other Ambulatory Visit (HOSPITAL_COMMUNITY): Payer: Self-pay

## 2022-09-28 DIAGNOSIS — M5136 Other intervertebral disc degeneration, lumbar region: Secondary | ICD-10-CM | POA: Diagnosis not present

## 2022-09-29 DIAGNOSIS — M5136 Other intervertebral disc degeneration, lumbar region: Secondary | ICD-10-CM | POA: Diagnosis not present

## 2022-09-30 ENCOUNTER — Telehealth: Payer: Self-pay

## 2022-09-30 DIAGNOSIS — M5136 Other intervertebral disc degeneration, lumbar region: Secondary | ICD-10-CM | POA: Diagnosis not present

## 2022-09-30 NOTE — Telephone Encounter (Signed)
Received call from patient's insurance regarding PA being needed for Valtrex.   Per insurance rep, medication needs prior authorization if it is being prescribed for greater than 14 days.   Durward Mallard- have you received a covermymeds key for this PA?   Veronda Prude, RN

## 2022-10-01 DIAGNOSIS — M5136 Other intervertebral disc degeneration, lumbar region: Secondary | ICD-10-CM | POA: Diagnosis not present

## 2022-10-04 ENCOUNTER — Other Ambulatory Visit (HOSPITAL_COMMUNITY): Payer: Self-pay

## 2022-10-04 DIAGNOSIS — M5136 Other intervertebral disc degeneration, lumbar region: Secondary | ICD-10-CM | POA: Diagnosis not present

## 2022-10-05 ENCOUNTER — Other Ambulatory Visit: Payer: Self-pay

## 2022-10-05 DIAGNOSIS — M5136 Other intervertebral disc degeneration, lumbar region: Secondary | ICD-10-CM | POA: Diagnosis not present

## 2022-10-12 ENCOUNTER — Ambulatory Visit (HOSPITAL_BASED_OUTPATIENT_CLINIC_OR_DEPARTMENT_OTHER)
Admission: RE | Admit: 2022-10-12 | Discharge: 2022-10-12 | Disposition: A | Discharge status: Home / Self Care | Payer: Medicaid Other | Source: Ambulatory Visit | Attending: Family Medicine | Admitting: Family Medicine

## 2022-10-12 ENCOUNTER — Encounter (HOSPITAL_COMMUNITY): Payer: Self-pay

## 2022-10-12 ENCOUNTER — Ambulatory Visit (HOSPITAL_COMMUNITY): Admission: RE | Admit: 2022-10-12 | Payer: Medicaid Other | Source: Ambulatory Visit

## 2022-10-12 VITALS — BP 102/68 | HR 70 | Wt 290.0 lb

## 2022-10-12 DIAGNOSIS — F17210 Nicotine dependence, cigarettes, uncomplicated: Secondary | ICD-10-CM | POA: Diagnosis not present

## 2022-10-12 DIAGNOSIS — I493 Ventricular premature depolarization: Secondary | ICD-10-CM | POA: Diagnosis not present

## 2022-10-12 DIAGNOSIS — G4733 Obstructive sleep apnea (adult) (pediatric): Secondary | ICD-10-CM | POA: Insufficient documentation

## 2022-10-12 DIAGNOSIS — G629 Polyneuropathy, unspecified: Secondary | ICD-10-CM | POA: Diagnosis not present

## 2022-10-12 DIAGNOSIS — R0789 Other chest pain: Secondary | ICD-10-CM | POA: Diagnosis not present

## 2022-10-12 DIAGNOSIS — I110 Hypertensive heart disease with heart failure: Secondary | ICD-10-CM | POA: Diagnosis not present

## 2022-10-12 DIAGNOSIS — I5032 Chronic diastolic (congestive) heart failure: Secondary | ICD-10-CM | POA: Diagnosis not present

## 2022-10-12 DIAGNOSIS — Z86718 Personal history of other venous thrombosis and embolism: Secondary | ICD-10-CM | POA: Diagnosis not present

## 2022-10-12 DIAGNOSIS — R609 Edema, unspecified: Secondary | ICD-10-CM | POA: Insufficient documentation

## 2022-10-12 DIAGNOSIS — E851 Neuropathic heredofamilial amyloidosis: Secondary | ICD-10-CM | POA: Diagnosis not present

## 2022-10-12 DIAGNOSIS — I25110 Atherosclerotic heart disease of native coronary artery with unstable angina pectoris: Secondary | ICD-10-CM | POA: Diagnosis not present

## 2022-10-12 DIAGNOSIS — E785 Hyperlipidemia, unspecified: Secondary | ICD-10-CM | POA: Insufficient documentation

## 2022-10-12 DIAGNOSIS — Z7901 Long term (current) use of anticoagulants: Secondary | ICD-10-CM | POA: Insufficient documentation

## 2022-10-12 DIAGNOSIS — Z720 Tobacco use: Secondary | ICD-10-CM

## 2022-10-12 DIAGNOSIS — R002 Palpitations: Secondary | ICD-10-CM | POA: Diagnosis not present

## 2022-10-12 DIAGNOSIS — Z6841 Body Mass Index (BMI) 40.0 and over, adult: Secondary | ICD-10-CM | POA: Insufficient documentation

## 2022-10-12 DIAGNOSIS — I2510 Atherosclerotic heart disease of native coronary artery without angina pectoris: Secondary | ICD-10-CM | POA: Insufficient documentation

## 2022-10-12 DIAGNOSIS — R001 Bradycardia, unspecified: Secondary | ICD-10-CM | POA: Diagnosis not present

## 2022-10-12 DIAGNOSIS — Z955 Presence of coronary angioplasty implant and graft: Secondary | ICD-10-CM | POA: Insufficient documentation

## 2022-10-12 DIAGNOSIS — M5136 Other intervertebral disc degeneration, lumbar region: Secondary | ICD-10-CM | POA: Diagnosis not present

## 2022-10-12 DIAGNOSIS — E119 Type 2 diabetes mellitus without complications: Secondary | ICD-10-CM

## 2022-10-12 DIAGNOSIS — G63 Polyneuropathy in diseases classified elsewhere: Secondary | ICD-10-CM

## 2022-10-12 DIAGNOSIS — I739 Peripheral vascular disease, unspecified: Secondary | ICD-10-CM

## 2022-10-12 LAB — ECHOCARDIOGRAM COMPLETE
Area-P 1/2: 3.99 cm2
Calc EF: 50.5 %
S' Lateral: 3.7 cm
Single Plane A2C EF: 48.7 %
Single Plane A4C EF: 52.6 %

## 2022-10-12 LAB — CBC
HCT: 45.9 % (ref 39.0–52.0)
Hemoglobin: 15.2 g/dL (ref 13.0–17.0)
MCH: 30.6 pg (ref 26.0–34.0)
MCHC: 33.1 g/dL (ref 30.0–36.0)
MCV: 92.5 fL (ref 80.0–100.0)
Platelets: 234 10*3/uL (ref 150–400)
RBC: 4.96 MIL/uL (ref 4.22–5.81)
RDW: 15 % (ref 11.5–15.5)
WBC: 8.4 10*3/uL (ref 4.0–10.5)
nRBC: 0 % (ref 0.0–0.2)

## 2022-10-12 LAB — BASIC METABOLIC PANEL
Anion gap: 11 (ref 5–15)
BUN: 19 mg/dL (ref 8–23)
CO2: 25 mmol/L (ref 22–32)
Calcium: 9.2 mg/dL (ref 8.9–10.3)
Chloride: 101 mmol/L (ref 98–111)
Creatinine, Ser: 1.38 mg/dL — ABNORMAL HIGH (ref 0.61–1.24)
GFR, Estimated: 57 mL/min — ABNORMAL LOW (ref >60)
Glucose, Bld: 119 mg/dL — ABNORMAL HIGH (ref 70–99)
Potassium: 4.3 mmol/L (ref 3.5–5.1)
Sodium: 137 mmol/L (ref 135–145)

## 2022-10-12 LAB — BRAIN NATRIURETIC PEPTIDE: B Natriuretic Peptide: 20.3 pg/mL (ref 0.0–100.0)

## 2022-10-12 MED ORDER — ENALAPRIL MALEATE 10 MG PO TABS: 10.0000 mg | ORAL_TABLET | Freq: Every day | ORAL | 3 refills | Status: DC

## 2022-10-12 MED ORDER — ROSUVASTATIN CALCIUM 10 MG PO TABS: 10.0000 mg | ORAL_TABLET | Freq: Every day | ORAL | 3 refills | Status: DC

## 2022-10-12 MED ORDER — DAPAGLIFLOZIN PROPANEDIOL 10 MG PO TABS: 10.0000 mg | ORAL_TABLET | Freq: Every day | ORAL | 3 refills | Status: DC
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
## 2022-10-12 NOTE — Progress Notes (Signed)
Echocardiogram 2D Echocardiogram has been performed.  Russell Collier 10/12/2022, 10:43 AM

## 2022-10-12 NOTE — Patient Instructions (Signed)
Medication Changes:  STOP ISOSORBIDE MONITRATE   Lab Work:  Labs done today, your results will be available in MyChart, we will contact you for abnormal readings.  Follow-Up in: 4 MONTHS WITH DR. Gala Romney AS SCHEDULED   At the [facility], you and your health needs are our priority. We have a designated team specialized in the treatment of Heart Failure. This Care Team includes your primary Heart Failure Specialized Cardiologist (physician), Advanced Practice Providers (APPs- Physician Assistants and Nurse Practitioners), and Pharmacist who all work together to provide you with the care you need, when you need it.   You may see any of the following providers on your designated Care Team at your next follow up:  Dr. Arvilla Meres Dr. Marca Ancona Dr. Marcos Eke, NP Robbie Lis, [place] [facility] [place], [place] Brynda Peon, NP Karle Plumber, PharmD   Please be sure to bring in all your medications bottles to every appointment.   Need to Contact us:  If you have any questions or concerns before your next appointment please send [place] a message through [place] or call our office at [phone].    TO LEAVE A MESSAGE FOR THE NURSE SELECT OPTION 2, PLEASE LEAVE A MESSAGE INCLUDING: YOUR NAME DATE OF BIRTH CALL BACK NUMBER REASON FOR CALL**this is important as we prioritize the call backs  YOU WILL RECEIVE A CALL BACK THE SAME DAY AS LONG AS YOU CALL BEFORE 4:00 PM

## 2022-10-13 DIAGNOSIS — M5136 Other intervertebral disc degeneration, lumbar region: Secondary | ICD-10-CM | POA: Diagnosis not present

## 2022-10-14 DIAGNOSIS — M5136 Other intervertebral disc degeneration, lumbar region: Secondary | ICD-10-CM | POA: Diagnosis not present

## 2022-10-15 DIAGNOSIS — M5136 Other intervertebral disc degeneration, lumbar region: Secondary | ICD-10-CM | POA: Diagnosis not present

## 2022-10-18 ENCOUNTER — Ambulatory Visit (INDEPENDENT_AMBULATORY_CARE_PROVIDER_SITE_OTHER): Payer: Medicaid Other | Admitting: Student

## 2022-10-18 ENCOUNTER — Encounter: Payer: Self-pay | Admitting: Student

## 2022-10-18 VITALS — BP 98/58 | HR 79 | Ht 70.5 in | Wt 294.0 lb

## 2022-10-18 DIAGNOSIS — E119 Type 2 diabetes mellitus without complications: Secondary | ICD-10-CM

## 2022-10-18 DIAGNOSIS — Z720 Tobacco use: Secondary | ICD-10-CM | POA: Diagnosis not present

## 2022-10-18 DIAGNOSIS — I10 Essential (primary) hypertension: Secondary | ICD-10-CM | POA: Diagnosis not present

## 2022-10-18 DIAGNOSIS — E1151 Type 2 diabetes mellitus with diabetic peripheral angiopathy without gangrene: Secondary | ICD-10-CM

## 2022-10-18 DIAGNOSIS — M5136 Other intervertebral disc degeneration, lumbar region: Secondary | ICD-10-CM | POA: Diagnosis not present

## 2022-10-18 LAB — POCT GLYCOSYLATED HEMOGLOBIN (HGB A1C): HbA1c, POC (controlled diabetic range): 5.9 % (ref 0.0–7.0)

## 2022-10-18 MED ORDER — ROSUVASTATIN CALCIUM 10 MG PO TABS: 10.0000 mg | ORAL_TABLET | Freq: Every day | ORAL | 3 refills | Status: DC

## 2022-10-18 MED ORDER — DAPAGLIFLOZIN PROPANEDIOL 10 MG PO TABS: 10.0000 mg | ORAL_TABLET | Freq: Every day | ORAL | 3 refills | Status: DC

## 2022-10-18 MED ORDER — PREGABALIN 100 MG PO CAPS: 100.0000 mg | ORAL_CAPSULE | Freq: Three times a day (TID) | ORAL | 3 refills | Status: DC

## 2022-10-18 MED ORDER — ENALAPRIL MALEATE 10 MG PO TABS: 10.0000 mg | ORAL_TABLET | Freq: Every day | ORAL | 3 refills | Status: DC

## 2022-10-18 MED ORDER — FUROSEMIDE 40 MG PO TABS: 40.0000 mg | ORAL_TABLET | Freq: Two times a day (BID) | ORAL | 3 refills | Status: DC

## 2022-10-18 MED ORDER — CLOPIDOGREL BISULFATE 75 MG PO TABS: 75.0000 mg | ORAL_TABLET | Freq: Every day | ORAL | 3 refills | Status: DC

## 2022-10-18 MED ORDER — APIXABAN 2.5 MG PO TABS: 2.5000 mg | ORAL_TABLET | Freq: Two times a day (BID) | ORAL | 3 refills | Status: DC

## 2022-10-18 NOTE — Assessment & Plan Note (Signed)
BP readings have been hypotensive.  Seems to have symptomatic hypotension with reports of intermittent dizziness.  Aspiration precautions with patient suspect patient could be placed up on his enalapril medication.  He is supposed to take 1 at bedtime daily however patient endorses taking 2 times daily.   -Emphasized need for patient to take his medications as prescribed.   -Advised him to keep a daily blood pressure log -Follow-up in 2-3 weeks to reassess blood pressure he starts taking the appropriate dose.

## 2022-10-18 NOTE — Patient Instructions (Signed)
It was wonderful to see you today. Thank you for allowing me to be a part of your care. Below is a short summary of what we discussed at your visit today:  Today improved at 5.9.  Blood pressure today slightly low.  Likely because you are taking double enalapril.  You are supposed to take the 10 mg 1 time at bedtime.  Please check your blood pressures daily and I will see you in 3-4 weeks to reassess your blood pressures.  Refilled your medications today.  You are currently due for your abnormal chest CT to screen for lung cancer given your smoking history.  Chest CT order has been placed.  Placed an order to check your cholesterol level today.  Follow-up in 3-4 weeks to check your blood pressures.  Please bring all of your medications to every appointment!  If you have any questions or concerns, please do not hesitate to contact us via phone or MyChart message.   Jerre Simon, MD [facility]

## 2022-10-18 NOTE — Assessment & Plan Note (Signed)
Well-controlled diabetes on Farxiga.  A1c today is 5.9. -A1c obtained today.

## 2022-10-18 NOTE — Progress Notes (Signed)
Annual Wellness Visit     Patient: Russell Collier, Male    DOB: [birth year], 62 y.o.   MRN: [id number]  Subjective  Chief Complaint  Patient presents with   Diabetes    Russell Collier is a 62 y.o. male who presents today for his Annual Wellness Visit.  Diet: Regular food, stopped eating out but conscious about diet, drinking ginger and using seamoss  Sleep:Good, 4-5hrs  Exercise:  starting back and going up and down stairs, walking most places Alcohol use:  Occassional Tobacco use: 1/2 pack a day for about 45years Illicit drug use: THC once a while Sexually active: Yes  Works as: On disability Lives with: Russell Collier  Code Status: Full Code Power of Attorney:Niece, [name]   Medical concerns: Patient although blood pressure standing still lightheadedness when showering.  Saw cardiologist recently who discontinued his abdomen due to low blood pressure readings.  A1c = 5.9 Due for lung CT, Colonscopy A1c, Need eye exam  Medications: Outpatient Medications Prior to Visit  Medication Sig   acetaminophen (TYLENOL) 325 MG tablet Take 2 tablets (650 mg total) by mouth every 6 (six) hours as needed for mild pain or moderate pain.   albuterol (PROVENTIL) (2.5 MG/3ML) 0.083% nebulizer solution Take 3 mLs (2.5 mg total) by nebulization every 6 (six) hours as needed for wheezing or shortness of breath.   ANORO ELLIPTA 62.5-25 MCG/ACT AEPB INHALE 1 PUFF INTO THE LUNGS DAILY AS NEEDED.   betamethasone valerate ointment (VALISONE) 0.1 % Apply 1 Application topically 2 (two) times daily.   Blood Glucose Monitoring Suppl DEVI 1 each by Does not apply route in the morning, at noon, and at bedtime. May substitute to any manufacturer covered by patient's insurance.   cephALEXin (KEFLEX) 500 MG capsule Take 1 capsule (500 mg total) by mouth 3 (three) times daily.   fluticasone (FLONASE) 50 MCG/ACT nasal spray PLACE 1 SPRAY INTO BOTH NOSTRILS DAILY.   ibuprofen (ADVIL) 200 MG tablet Take  600 mg by mouth every 6 (six) hours as needed for headache or moderate pain.   lidocaine (LIDODERM) 5 % Place 1 patch onto the skin daily. Remove & Discard patch within 12 hours or as directed by MD   multivitamin (ONE-A-DAY MEN'S) TABS tablet Take 1 tablet by mouth daily with breakfast.   pantoprazole (PROTONIX) 40 MG tablet TAKE 1 TABLET (40 MG TOTAL) BY MOUTH DAILY.   potassium chloride SA (KLOR-CON M) 20 MEQ tablet TAKE 2 TABLETS (40 MEQ TOTAL) BY MOUTH DAILY.   Skin Protectants, Misc. (MINERIN CREME) CREA Apply on affected area twice daily   Tafamidis (VYNDAMAX) 61 MG CAPS TAKE 1 CAPSULE BY MOUTH DAILY.   triamcinolone ointment (KENALOG) 0.5 % Apply 1 Application topically 2 (two) times daily.   valACYclovir (VALTREX) 500 MG tablet TAKE 1 TABLET (500 MG TOTAL) BY MOUTH DAILY.   VENTOLIN HFA 108 (90 Base) MCG/ACT inhaler INHALE 2 PUFFS INTO THE LUNGS EVERY 6 (SIX) HOURS AS NEEDED FOR WHEEZING OR SHORTNESS OF BREATH.   Vitamin A 2400 MCG (8000 UT) TABS Take 1 tablet by mouth daily.   vutrisiran sodium (AMVUTTRA) 25 MG/0.5ML syringe Inject 0.5 mLs (25 mg total) into the skin every 3 (three) months.   [DISCONTINUED] clopidogrel (PLAVIX) 75 MG tablet TAKE 1 TABLET (75 MG TOTAL) BY MOUTH DAILY.   [DISCONTINUED] dapagliflozin propanediol (FARXIGA) 10 MG TABS tablet Take 1 tablet (10 mg total) by mouth daily before breakfast.   [DISCONTINUED] ELIQUIS 2.5 MG TABS tablet TAKE 1 TABLET (  2.5 MG TOTAL) BY MOUTH 2 (TWO) TIMES DAILY.   [DISCONTINUED] enalapril (VASOTEC) 10 MG tablet Take 1 tablet (10 mg total) by mouth at bedtime.   [DISCONTINUED] furosemide (LASIX) 40 MG tablet TAKE 1 TABLET (40 MG TOTAL) BY MOUTH 2 (TWO) TIMES DAILY.   [DISCONTINUED] pregabalin (LYRICA) 100 MG capsule Take 1 capsule (100 mg total) by mouth 3 (three) times daily.   [DISCONTINUED] rosuvastatin (CRESTOR) 10 MG tablet Take 1 tablet (10 mg total) by mouth daily.   No facility-administered medications prior to visit.     Allergies  Allergen Reactions   Chantix [Varenicline] Anaphylaxis, Swelling and Other (See Comments)    Patient reports laryngospasm stopped in the ER, throat swelling   Bupropion Other (See Comments)    Headache - moderate/severe - self discontinued agent    Bactrim [Sulfamethoxazole-Trimethoprim] Rash    Rash and hives   Nicoderm [Nicotine] Rash    Patch    Objective  BP (!) 98/58   Pulse 79   Ht 5' 10.5" (1.791 m)   Wt 294 lb (133.4 kg)   SpO2 98%   BMI 41.59 kg/m   Physical Exam Constitutional:      Appearance: Normal appearance. He is obese.  HENT:     Head: Normocephalic.     Right Ear: Tympanic membrane and ear canal normal.     Left Ear: Tympanic membrane and ear canal normal.  Eyes:     Extraocular Movements: Extraocular movements intact.     Pupils: Pupils are equal, round, and reactive to light.  Cardiovascular:     Rate and Rhythm: Normal rate and regular rhythm.     Heart sounds: Normal heart sounds.  Pulmonary:     Effort: Pulmonary effort is normal.     Breath sounds: Normal breath sounds.  Abdominal:     General: Bowel sounds are normal.     Palpations: Abdomen is soft.  Musculoskeletal:     Cervical back: Normal range of motion and neck supple.  Skin:    General: Skin is warm and dry.     Capillary Refill: Capillary refill takes less than 2 seconds.  Neurological:     General: No focal deficit present.     Mental Status: He is alert and oriented to person, place, and time. Mental status is at baseline.  Psychiatric:        Mood and Affect: Mood normal.        Behavior: Behavior normal.        Thought Content: Thought content normal.    Results for orders placed or performed in visit on 10/18/22  POCT A1C  Result Value Ref Range   Hemoglobin A1C     HbA1c POC (<> result, manual entry)     HbA1c, POC (prediabetic range)     HbA1c, POC (controlled diabetic range) 5.9 0.0 - 7.0 %    Assessment & Plan   Annual wellness visit done today  including the all of the following: Reviewed patient's Family Medical History Reviewed and updated list of patient's medical providers Assessment of cognitive impairment was done Assessed patient's functional ability Established a written schedule for health screening services Health Risk Assessent Completed and Reviewed  Exercise Activities and Dietary recommendations  Goals      Manage My Medicine     Timeframe:  Short-Term Goal Priority:  High Start Date:  Expected End Date:                       Follow Up Date Monthly   - keep a list of all the medicines I take; vitamins and herbals too - use a pillbox to sort medicine    Why is this important?   These steps will help you keep on track with your medicines.   Notes:         Immunization History  Administered Date(s) Administered   Influenza,inj,Quad PF,6+ Mos 02/03/2018, 10/26/2019, 12/23/2020   Moderna SARS-COV2 Booster Vaccination 02/19/2020, 06/11/2020   Moderna Sars-Covid-2 Vaccination 07/19/2019, 08/16/2019   PNEUMOCOCCAL CONJUGATE-20 09/16/2020   Pfizer Covid-19 Vaccine Bivalent Booster 64yrs & up 01/19/2021   Pneumococcal Polysaccharide-23 02/03/2018   Tdap 03/05/2020    Health Maintenance  Topic Date Due   Diabetic kidney evaluation - Urine ACR  Never done   Zoster Vaccines- Shingrix (1 of 2) Never done   Colonoscopy  Never done   Lung Cancer Screening  07/26/2020   FOOT EXAM  09/16/2021   OPHTHALMOLOGY EXAM  10/14/2021   COVID-19 Vaccine (4 - 2023-24 season) 10/23/2021   INFLUENZA VACCINE  09/23/2022   HEMOGLOBIN A1C  04/20/2023   Diabetic kidney evaluation - eGFR measurement  10/12/2023   DTaP/Tdap/Td (2 - Td or Tdap) 03/05/2030   Hepatitis C Screening  Completed   HIV Screening  Completed   HPV VACCINES  Aged Out     Discussed health benefits of physical activity, and encouraged him to engage in regular exercise appropriate for his age and condition.    Problem  List Items Addressed This Visit       Cardiovascular and Mediastinum   Essential hypertension    BP readings have been hypotensive.  Seems to have symptomatic hypotension with reports of intermittent dizziness.  Aspiration precautions with patient suspect patient could be placed up on his enalapril medication.  He is supposed to take 1 at bedtime daily however patient endorses taking 2 times daily.   -Emphasized need for patient to take his medications as prescribed.   -Advised him to keep a daily blood pressure log -Follow-up in 2-3 weeks to reassess blood pressure he starts taking the appropriate dose.      Relevant Medications   apixaban (ELIQUIS) 2.5 MG TABS tablet   enalapril (VASOTEC) 10 MG tablet   furosemide (LASIX) 40 MG tablet   rosuvastatin (CRESTOR) 10 MG tablet   Type 2 diabetes mellitus with diabetic peripheral angiopathy without gangrene, without long-term current use of insulin (HCC)    Well-controlled diabetes on Farxiga.  A1c today is 5.9. -A1c obtained today.      Relevant Medications   dapagliflozin propanediol (FARXIGA) 10 MG TABS tablet   apixaban (ELIQUIS) 2.5 MG TABS tablet   enalapril (VASOTEC) 10 MG tablet   furosemide (LASIX) 40 MG tablet   rosuvastatin (CRESTOR) 10 MG tablet     Endocrine   Type 2 diabetes mellitus without complication, without long-term current use of insulin (HCC) - Primary   Relevant Medications   dapagliflozin propanediol (FARXIGA) 10 MG TABS tablet   enalapril (VASOTEC) 10 MG tablet   rosuvastatin (CRESTOR) 10 MG tablet   Other Relevant Orders   POCT A1C (Completed)   Lipid Panel     Other   Tobacco abuse    Currently smoking half a pack a day and still contemplating smoking cessation. -Ordered low-dose chest CT for annual lungs cancer screening.  Relevant Orders   CT CHEST LUNG CA SCREEN LOW DOSE W/O CM   Health maintenance Patient is due for colonoscopy.  Declines colonoscopy at this time.  Jerre Simon,  MD

## 2022-10-18 NOTE — Assessment & Plan Note (Addendum)
Currently smoking half a pack a day and still contemplating smoking cessation. -Ordered low-dose chest CT for annual lungs cancer screening.

## 2022-10-19 DIAGNOSIS — M5136 Other intervertebral disc degeneration, lumbar region: Secondary | ICD-10-CM | POA: Diagnosis not present

## 2022-10-19 LAB — LIPID PANEL
Chol/HDL Ratio: 3.8 ratio (ref 0.0–5.0)
Cholesterol, Total: 120 mg/dL (ref 100–199)
HDL: 32 mg/dL — ABNORMAL LOW (ref >39)
LDL Chol Calc (NIH): 71 mg/dL (ref 0–99)
Triglycerides: 89 mg/dL (ref 0–149)
VLDL Cholesterol Cal: 17 mg/dL (ref 5–40)

## 2022-10-20 ENCOUNTER — Encounter (HOSPITAL_BASED_OUTPATIENT_CLINIC_OR_DEPARTMENT_OTHER): Payer: Self-pay | Admitting: Physical Therapy

## 2022-10-20 ENCOUNTER — Ambulatory Visit (HOSPITAL_BASED_OUTPATIENT_CLINIC_OR_DEPARTMENT_OTHER): Payer: Medicaid Other | Attending: Family Medicine | Admitting: Physical Therapy

## 2022-10-20 DIAGNOSIS — M6281 Muscle weakness (generalized): Secondary | ICD-10-CM | POA: Diagnosis present

## 2022-10-20 DIAGNOSIS — R262 Difficulty in walking, not elsewhere classified: Secondary | ICD-10-CM | POA: Diagnosis present

## 2022-10-20 DIAGNOSIS — M5136 Other intervertebral disc degeneration, lumbar region: Secondary | ICD-10-CM | POA: Diagnosis not present

## 2022-10-20 DIAGNOSIS — M5459 Other low back pain: Secondary | ICD-10-CM | POA: Diagnosis present
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
## 2022-10-21 DIAGNOSIS — M5136 Other intervertebral disc degeneration, lumbar region: Secondary | ICD-10-CM | POA: Diagnosis not present

## 2022-10-22 ENCOUNTER — Ambulatory Visit (HOSPITAL_BASED_OUTPATIENT_CLINIC_OR_DEPARTMENT_OTHER): Payer: Medicaid Other | Admitting: Physical Therapy

## 2022-10-22 ENCOUNTER — Encounter (HOSPITAL_BASED_OUTPATIENT_CLINIC_OR_DEPARTMENT_OTHER): Payer: Self-pay | Admitting: Physical Therapy

## 2022-10-22 DIAGNOSIS — M5459 Other low back pain: Secondary | ICD-10-CM | POA: Diagnosis not present

## 2022-10-22 DIAGNOSIS — M6281 Muscle weakness (generalized): Secondary | ICD-10-CM

## 2022-10-22 DIAGNOSIS — R262 Difficulty in walking, not elsewhere classified: Secondary | ICD-10-CM

## 2022-10-22 DIAGNOSIS — M5136 Other intervertebral disc degeneration, lumbar region: Secondary | ICD-10-CM | POA: Diagnosis not present
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
## 2022-10-25 DIAGNOSIS — M5136 Other intervertebral disc degeneration, lumbar region: Secondary | ICD-10-CM | POA: Diagnosis not present

## 2022-10-26 ENCOUNTER — Ambulatory Visit (HOSPITAL_BASED_OUTPATIENT_CLINIC_OR_DEPARTMENT_OTHER): Payer: Medicaid Other | Attending: Family Medicine | Admitting: Physical Therapy

## 2022-10-26 ENCOUNTER — Other Ambulatory Visit: Payer: Self-pay

## 2022-10-26 DIAGNOSIS — M5459 Other low back pain: Secondary | ICD-10-CM | POA: Diagnosis present

## 2022-10-26 DIAGNOSIS — M6281 Muscle weakness (generalized): Secondary | ICD-10-CM | POA: Insufficient documentation

## 2022-10-26 DIAGNOSIS — M79671 Pain in right foot: Secondary | ICD-10-CM | POA: Insufficient documentation

## 2022-10-26 DIAGNOSIS — M5136 Other intervertebral disc degeneration, lumbar region: Secondary | ICD-10-CM | POA: Diagnosis not present

## 2022-10-26 DIAGNOSIS — R262 Difficulty in walking, not elsewhere classified: Secondary | ICD-10-CM | POA: Diagnosis present

## 2022-10-26 DIAGNOSIS — M25672 Stiffness of left ankle, not elsewhere classified: Secondary | ICD-10-CM | POA: Diagnosis present

## 2022-10-26 DIAGNOSIS — M25671 Stiffness of right ankle, not elsewhere classified: Secondary | ICD-10-CM | POA: Insufficient documentation
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
## 2022-10-27 ENCOUNTER — Other Ambulatory Visit (HOSPITAL_COMMUNITY): Payer: Self-pay

## 2022-10-27 DIAGNOSIS — M5136 Other intervertebral disc degeneration, lumbar region: Secondary | ICD-10-CM | POA: Diagnosis not present

## 2022-10-28 ENCOUNTER — Other Ambulatory Visit (HOSPITAL_COMMUNITY): Payer: Self-pay

## 2022-10-28 ENCOUNTER — Other Ambulatory Visit: Payer: Self-pay

## 2022-10-28 ENCOUNTER — Other Ambulatory Visit (HOSPITAL_COMMUNITY): Payer: Self-pay | Admitting: Internal Medicine

## 2022-10-28 ENCOUNTER — Telehealth: Payer: Self-pay

## 2022-10-28 DIAGNOSIS — M5136 Other intervertebral disc degeneration, lumbar region: Secondary | ICD-10-CM | POA: Diagnosis not present

## 2022-10-28 NOTE — Telephone Encounter (Signed)
Pharmacy Patient Advocate Encounter   Received notification from CoverMyMeds that prior authorization for [facility] is required/requested.   Insurance verification completed.   The patient is insured through [facility] MEDICAID .   Per test claim: PA required; PA submitted to [facility] MEDICAID via CoverMyMeds Key/confirmation #/EOC [id number]. Status is pending

## 2022-10-29 ENCOUNTER — Encounter (HOSPITAL_BASED_OUTPATIENT_CLINIC_OR_DEPARTMENT_OTHER): Payer: Self-pay | Admitting: Physical Therapy

## 2022-10-29 ENCOUNTER — Ambulatory Visit (HOSPITAL_BASED_OUTPATIENT_CLINIC_OR_DEPARTMENT_OTHER): Payer: Medicaid Other | Admitting: Physical Therapy

## 2022-10-29 ENCOUNTER — Other Ambulatory Visit (HOSPITAL_COMMUNITY): Payer: Self-pay

## 2022-10-29 ENCOUNTER — Other Ambulatory Visit: Payer: Self-pay

## 2022-10-29 DIAGNOSIS — M5459 Other low back pain: Secondary | ICD-10-CM | POA: Diagnosis not present

## 2022-10-29 DIAGNOSIS — M6281 Muscle weakness (generalized): Secondary | ICD-10-CM

## 2022-10-29 DIAGNOSIS — M5136 Other intervertebral disc degeneration, lumbar region: Secondary | ICD-10-CM | POA: Diagnosis not present

## 2022-10-29 DIAGNOSIS — R262 Difficulty in walking, not elsewhere classified: Secondary | ICD-10-CM

## 2022-10-29 MED ORDER — VYNDAMAX 61 MG PO CAPS
1.0000 | ORAL_CAPSULE | Freq: Every day | ORAL | 6 refills | Status: DC
  Filled 2022-10-29: qty 30, 30d supply, fill #0
  Filled 2022-11-26: qty 30, 30d supply, fill #1
  Filled 2022-12-22: qty 30, 30d supply, fill #2
  Filled 2023-01-17: qty 30, 30d supply, fill #3
  Filled 2023-02-15: qty 30, 30d supply, fill #4
  Filled 2023-03-16: qty 30, 30d supply, fill #5
  Filled 2023-04-15: qty 30, 30d supply, fill #6
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
## 2022-10-29 NOTE — Telephone Encounter (Signed)
Pharmacy Patient Advocate Encounter  Received notification from [facility] MEDICAID that Prior Authorization for Marcelline Deist has been APPROVED from 10/28/22 to 10/28/23   PA #/Case ID/Reference #: [id number]

## 2022-11-01 DIAGNOSIS — M5136 Other intervertebral disc degeneration, lumbar region: Secondary | ICD-10-CM | POA: Diagnosis not present

## 2022-11-02 DIAGNOSIS — M5136 Other intervertebral disc degeneration, lumbar region: Secondary | ICD-10-CM | POA: Diagnosis not present

## 2022-11-03 ENCOUNTER — Encounter (HOSPITAL_BASED_OUTPATIENT_CLINIC_OR_DEPARTMENT_OTHER): Payer: Self-pay | Admitting: Physical Therapy

## 2022-11-03 ENCOUNTER — Other Ambulatory Visit: Payer: Self-pay | Admitting: Student

## 2022-11-03 ENCOUNTER — Ambulatory Visit (HOSPITAL_BASED_OUTPATIENT_CLINIC_OR_DEPARTMENT_OTHER): Payer: Medicaid Other | Admitting: Physical Therapy

## 2022-11-03 DIAGNOSIS — M5136 Other intervertebral disc degeneration, lumbar region: Secondary | ICD-10-CM | POA: Diagnosis not present

## 2022-11-03 DIAGNOSIS — M6281 Muscle weakness (generalized): Secondary | ICD-10-CM

## 2022-11-03 DIAGNOSIS — M5459 Other low back pain: Secondary | ICD-10-CM | POA: Diagnosis not present

## 2022-11-03 DIAGNOSIS — R262 Difficulty in walking, not elsewhere classified: Secondary | ICD-10-CM
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
## 2022-11-04 ENCOUNTER — Ambulatory Visit
Admission: RE | Admit: 2022-11-04 | Discharge: 2022-11-04 | Disposition: A | Discharge status: Home / Self Care | Payer: Medicaid Other | Source: Ambulatory Visit | Attending: Family Medicine | Admitting: Family Medicine

## 2022-11-04 DIAGNOSIS — M5136 Other intervertebral disc degeneration, lumbar region: Secondary | ICD-10-CM | POA: Diagnosis not present

## 2022-11-04 DIAGNOSIS — Z720 Tobacco use: Secondary | ICD-10-CM

## 2022-11-05 ENCOUNTER — Ambulatory Visit (HOSPITAL_BASED_OUTPATIENT_CLINIC_OR_DEPARTMENT_OTHER): Payer: Medicaid Other | Admitting: Physical Therapy

## 2022-11-05 DIAGNOSIS — M25672 Stiffness of left ankle, not elsewhere classified: Secondary | ICD-10-CM

## 2022-11-05 DIAGNOSIS — M5459 Other low back pain: Secondary | ICD-10-CM

## 2022-11-05 DIAGNOSIS — R262 Difficulty in walking, not elsewhere classified: Secondary | ICD-10-CM

## 2022-11-05 DIAGNOSIS — M6281 Muscle weakness (generalized): Secondary | ICD-10-CM

## 2022-11-05 DIAGNOSIS — M5136 Other intervertebral disc degeneration, lumbar region: Secondary | ICD-10-CM | POA: Diagnosis not present

## 2022-11-05 DIAGNOSIS — M25671 Stiffness of right ankle, not elsewhere classified: Secondary | ICD-10-CM
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
## 2022-11-07 ENCOUNTER — Encounter (HOSPITAL_BASED_OUTPATIENT_CLINIC_OR_DEPARTMENT_OTHER): Payer: Self-pay | Admitting: Physical Therapy

## 2022-11-08 ENCOUNTER — Ambulatory Visit (INDEPENDENT_AMBULATORY_CARE_PROVIDER_SITE_OTHER): Payer: Medicaid Other | Admitting: Student

## 2022-11-08 ENCOUNTER — Encounter: Payer: Self-pay | Admitting: Student

## 2022-11-08 VITALS — BP 108/66 | HR 71 | Ht 70.0 in | Wt 290.4 lb

## 2022-11-08 DIAGNOSIS — M5136 Other intervertebral disc degeneration, lumbar region: Secondary | ICD-10-CM | POA: Diagnosis not present

## 2022-11-08 DIAGNOSIS — Z23 Encounter for immunization: Secondary | ICD-10-CM

## 2022-11-08 DIAGNOSIS — M109 Gout, unspecified: Secondary | ICD-10-CM

## 2022-11-08 MED ORDER — COLCHICINE 0.6 MG PO TABS
ORAL_TABLET | ORAL | 0 refills | Status: DC
Start: 2022-11-08 — End: 2023-02-24

## 2022-11-08 NOTE — Progress Notes (Signed)
    SUBJECTIVE:   CHIEF COMPLAINT / HPI:   Patient is a 62 year old male presenting today for hypertension follow-up. At last visit patient had symptomatic hypotension. Per discussion patient seemed to have been taking both the prescribed dose for enalapril. Today patient endorses significant improvement of symptoms though still will have occasional lightheadedness.  Denies any falls.. BP log: None taken   PERTINENT  PMH / PSH: Reviewed  OBJECTIVE:   BP 108/66   Pulse 71   Ht 5\' 10"  (1.778 m)   Wt 290 lb 6.4 oz (131.7 kg)   SpO2 100%   BMI 41.67 kg/m    Physical Exam General: Alert, well appearing, NAD Cardiovascular: RRR, No Murmurs, Normal S2/S2 Respiratory: CTAB, No wheezing or Rales Abdomen: No distension or tenderness Extremities: Right great toe mildly erythematous, edematous and tender on exam.   ASSESSMENT/PLAN:   Gout Reports gout flareup over the weekend.  Significant pain and edema of his left great toe over the weekend status is improved but still have some pain and erythema on exam today. -Rx colchicine 1.2 mg followed by 0.6 mg 1 hour later.  Patient will continue 0.6 mg for additional 2 days.   Hypotension BP today was normal.  Patient endorses improvement of symptoms.  Will continue all medications as prescribed.  Fall precautions discussed with patient and he will return should symptoms continue or get worse.  Verbalized understanding and agreeable to plan.  Healthcare maintenance Patient received COVID and flu vaccine today.  Tolerated this injection well.   Jerre Simon, MD [facility]

## 2022-11-08 NOTE — Patient Instructions (Addendum)
It was wonderful to see you today. Thank you for allowing me to be a part of your care. Below is a short summary of what we discussed at your visit today:  Blood pressure today is normal.  2 years ago symptoms have improved.  You continue to have dizziness or lightheadedness please return so that you can be evaluated and bring all your medications next time.  Today we are giving you your COVID and flu vaccine today.  For your gout flareup I have sent in prescription for colchicine which you 1.2 mg (2 tablets) and 1 hour later take 0.6 mg, on the next days take 0.6 mg daily for 2 days.  If you have any questions or concerns, please do not hesitate to contact us via phone or MyChart message.   Jerre Simon, MD [facility]

## 2022-11-08 NOTE — Assessment & Plan Note (Signed)
Reports gout flareup over the weekend.  Significant pain and edema of his left great toe over the weekend status is improved but still have some pain and erythema on exam today. -Rx colchicine 1.2 mg followed by 0.6 mg 1 hour later.  Patient will continue 0.6 mg for additional 2 days.

## 2022-11-09 DIAGNOSIS — M5136 Other intervertebral disc degeneration, lumbar region: Secondary | ICD-10-CM | POA: Diagnosis not present

## 2022-11-10 DIAGNOSIS — M5136 Other intervertebral disc degeneration, lumbar region: Secondary | ICD-10-CM | POA: Diagnosis not present

## 2022-11-11 DIAGNOSIS — M5136 Other intervertebral disc degeneration, lumbar region: Secondary | ICD-10-CM | POA: Diagnosis not present

## 2022-11-12 DIAGNOSIS — M5136 Other intervertebral disc degeneration, lumbar region: Secondary | ICD-10-CM | POA: Diagnosis not present

## 2022-11-13 ENCOUNTER — Encounter (HOSPITAL_COMMUNITY): Payer: Self-pay

## 2022-11-15 ENCOUNTER — Ambulatory Visit (HOSPITAL_BASED_OUTPATIENT_CLINIC_OR_DEPARTMENT_OTHER): Payer: Medicaid Other | Admitting: Physical Therapy

## 2022-11-15 DIAGNOSIS — M6281 Muscle weakness (generalized): Secondary | ICD-10-CM

## 2022-11-15 DIAGNOSIS — M5459 Other low back pain: Secondary | ICD-10-CM | POA: Diagnosis not present

## 2022-11-15 DIAGNOSIS — M79671 Pain in right foot: Secondary | ICD-10-CM

## 2022-11-15 DIAGNOSIS — R262 Difficulty in walking, not elsewhere classified: Secondary | ICD-10-CM

## 2022-11-15 DIAGNOSIS — M25671 Stiffness of right ankle, not elsewhere classified: Secondary | ICD-10-CM

## 2022-11-15 DIAGNOSIS — M25672 Stiffness of left ankle, not elsewhere classified: Secondary | ICD-10-CM

## 2022-11-15 DIAGNOSIS — M5136 Other intervertebral disc degeneration, lumbar region: Secondary | ICD-10-CM | POA: Diagnosis not present
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
## 2022-11-16 ENCOUNTER — Encounter (HOSPITAL_BASED_OUTPATIENT_CLINIC_OR_DEPARTMENT_OTHER): Payer: Self-pay | Admitting: Physical Therapy

## 2022-11-16 DIAGNOSIS — M5136 Other intervertebral disc degeneration, lumbar region: Secondary | ICD-10-CM | POA: Diagnosis not present

## 2022-11-17 DIAGNOSIS — M5136 Other intervertebral disc degeneration, lumbar region: Secondary | ICD-10-CM | POA: Diagnosis not present

## 2022-11-18 ENCOUNTER — Ambulatory Visit (HOSPITAL_BASED_OUTPATIENT_CLINIC_OR_DEPARTMENT_OTHER): Payer: Medicaid Other | Admitting: Physical Therapy

## 2022-11-18 DIAGNOSIS — M79671 Pain in right foot: Secondary | ICD-10-CM

## 2022-11-18 DIAGNOSIS — M6281 Muscle weakness (generalized): Secondary | ICD-10-CM

## 2022-11-18 DIAGNOSIS — M5459 Other low back pain: Secondary | ICD-10-CM | POA: Diagnosis not present

## 2022-11-18 DIAGNOSIS — M25672 Stiffness of left ankle, not elsewhere classified: Secondary | ICD-10-CM

## 2022-11-18 DIAGNOSIS — M5136 Other intervertebral disc degeneration, lumbar region: Secondary | ICD-10-CM | POA: Diagnosis not present

## 2022-11-18 DIAGNOSIS — R262 Difficulty in walking, not elsewhere classified: Secondary | ICD-10-CM

## 2022-11-18 DIAGNOSIS — M25671 Stiffness of right ankle, not elsewhere classified: Secondary | ICD-10-CM
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
## 2022-11-19 ENCOUNTER — Encounter (HOSPITAL_BASED_OUTPATIENT_CLINIC_OR_DEPARTMENT_OTHER): Payer: Self-pay | Admitting: Physical Therapy

## 2022-11-19 DIAGNOSIS — M5136 Other intervertebral disc degeneration, lumbar region: Secondary | ICD-10-CM | POA: Diagnosis not present

## 2022-11-22 ENCOUNTER — Encounter (HOSPITAL_BASED_OUTPATIENT_CLINIC_OR_DEPARTMENT_OTHER): Payer: Self-pay | Admitting: Physical Therapy

## 2022-11-22 ENCOUNTER — Ambulatory Visit (HOSPITAL_BASED_OUTPATIENT_CLINIC_OR_DEPARTMENT_OTHER): Payer: Medicaid Other | Admitting: Physical Therapy

## 2022-11-22 DIAGNOSIS — M5459 Other low back pain: Secondary | ICD-10-CM | POA: Diagnosis not present

## 2022-11-22 DIAGNOSIS — R262 Difficulty in walking, not elsewhere classified: Secondary | ICD-10-CM

## 2022-11-22 DIAGNOSIS — M79671 Pain in right foot: Secondary | ICD-10-CM

## 2022-11-22 DIAGNOSIS — M25671 Stiffness of right ankle, not elsewhere classified: Secondary | ICD-10-CM

## 2022-11-22 DIAGNOSIS — M6281 Muscle weakness (generalized): Secondary | ICD-10-CM

## 2022-11-22 DIAGNOSIS — M5136 Other intervertebral disc degeneration, lumbar region: Secondary | ICD-10-CM | POA: Diagnosis not present

## 2022-11-22 DIAGNOSIS — M25672 Stiffness of left ankle, not elsewhere classified: Secondary | ICD-10-CM
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
## 2022-11-24 ENCOUNTER — Ambulatory Visit (HOSPITAL_BASED_OUTPATIENT_CLINIC_OR_DEPARTMENT_OTHER): Payer: Medicaid Other | Attending: Family Medicine | Admitting: Physical Therapy

## 2022-11-24 DIAGNOSIS — M79671 Pain in right foot: Secondary | ICD-10-CM | POA: Insufficient documentation

## 2022-11-24 DIAGNOSIS — R262 Difficulty in walking, not elsewhere classified: Secondary | ICD-10-CM | POA: Diagnosis present

## 2022-11-24 DIAGNOSIS — M25672 Stiffness of left ankle, not elsewhere classified: Secondary | ICD-10-CM | POA: Insufficient documentation

## 2022-11-24 DIAGNOSIS — M6281 Muscle weakness (generalized): Secondary | ICD-10-CM | POA: Diagnosis present

## 2022-11-24 DIAGNOSIS — M25671 Stiffness of right ankle, not elsewhere classified: Secondary | ICD-10-CM | POA: Diagnosis present

## 2022-11-24 DIAGNOSIS — M5459 Other low back pain: Secondary | ICD-10-CM | POA: Insufficient documentation
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
## 2022-11-25 ENCOUNTER — Encounter (HOSPITAL_BASED_OUTPATIENT_CLINIC_OR_DEPARTMENT_OTHER): Payer: Self-pay | Admitting: Physical Therapy

## 2022-11-26 ENCOUNTER — Other Ambulatory Visit (HOSPITAL_COMMUNITY): Payer: Self-pay | Admitting: Pharmacy Technician

## 2022-11-26 ENCOUNTER — Other Ambulatory Visit (HOSPITAL_COMMUNITY): Payer: Self-pay

## 2022-11-26 NOTE — Progress Notes (Signed)
Specialty Pharmacy Refill Coordination Note  Russell Collier is a 62 y.o. male contacted today regarding refills of specialty medication(s) Tafamidis   Patient requested Delivery   Delivery date: 12/02/22   Verified address: [address] [place], [place]   Medication will be filled on 12/01/22.

## 2022-11-29 ENCOUNTER — Other Ambulatory Visit (HOSPITAL_BASED_OUTPATIENT_CLINIC_OR_DEPARTMENT_OTHER): Payer: Self-pay

## 2022-11-29 ENCOUNTER — Ambulatory Visit (HOSPITAL_BASED_OUTPATIENT_CLINIC_OR_DEPARTMENT_OTHER): Payer: Medicaid Other | Admitting: Physical Therapy

## 2022-11-29 DIAGNOSIS — M6281 Muscle weakness (generalized): Secondary | ICD-10-CM

## 2022-11-29 DIAGNOSIS — R262 Difficulty in walking, not elsewhere classified: Secondary | ICD-10-CM

## 2022-11-29 DIAGNOSIS — M5459 Other low back pain: Secondary | ICD-10-CM

## 2022-11-29 DIAGNOSIS — M25672 Stiffness of left ankle, not elsewhere classified: Secondary | ICD-10-CM

## 2022-11-29 DIAGNOSIS — M79671 Pain in right foot: Secondary | ICD-10-CM

## 2022-11-29 DIAGNOSIS — M25671 Stiffness of right ankle, not elsewhere classified: Secondary | ICD-10-CM

## 2022-11-29 MED ORDER — ZOSTER VAC RECOMB ADJUVANTED 50 MCG/0.5ML IM SUSR
0.5000 mL | Freq: Once | INTRAMUSCULAR | 1 refills | Status: AC
  Filled 2022-11-29: qty 0.5, 1d supply, fill #0
  Filled 2023-04-08: qty 0.5, 1d supply, fill #1
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
## 2022-11-30 ENCOUNTER — Encounter (HOSPITAL_BASED_OUTPATIENT_CLINIC_OR_DEPARTMENT_OTHER): Payer: Self-pay | Admitting: Physical Therapy

## 2022-11-30 DIAGNOSIS — Z0120 Encounter for dental examination and cleaning without abnormal findings: Secondary | ICD-10-CM | POA: Diagnosis not present

## 2022-12-01 ENCOUNTER — Ambulatory Visit (HOSPITAL_BASED_OUTPATIENT_CLINIC_OR_DEPARTMENT_OTHER): Payer: Medicaid Other | Admitting: Physical Therapy

## 2022-12-01 ENCOUNTER — Other Ambulatory Visit: Payer: Self-pay

## 2022-12-01 DIAGNOSIS — M25671 Stiffness of right ankle, not elsewhere classified: Secondary | ICD-10-CM

## 2022-12-01 DIAGNOSIS — M5459 Other low back pain: Secondary | ICD-10-CM | POA: Diagnosis not present

## 2022-12-01 DIAGNOSIS — M25672 Stiffness of left ankle, not elsewhere classified: Secondary | ICD-10-CM

## 2022-12-01 DIAGNOSIS — R262 Difficulty in walking, not elsewhere classified: Secondary | ICD-10-CM

## 2022-12-01 DIAGNOSIS — M79671 Pain in right foot: Secondary | ICD-10-CM

## 2022-12-01 DIAGNOSIS — M6281 Muscle weakness (generalized): Secondary | ICD-10-CM
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
## 2022-12-02 ENCOUNTER — Ambulatory Visit: Payer: Medicaid Other | Admitting: Podiatry

## 2022-12-03 ENCOUNTER — Other Ambulatory Visit (HOSPITAL_COMMUNITY): Payer: Self-pay

## 2022-12-03 ENCOUNTER — Encounter (HOSPITAL_BASED_OUTPATIENT_CLINIC_OR_DEPARTMENT_OTHER): Payer: Self-pay | Admitting: Physical Therapy

## 2022-12-03 ENCOUNTER — Telehealth: Payer: Self-pay

## 2022-12-03 NOTE — Telephone Encounter (Signed)
Pharmacy Patient Advocate Encounter   Received notification from Fax that prior authorization for VALACYCLOVIR is required/requested.   PA required; PA submitted to [facility] MEDICAID via CoverMyMeds Key/confirmation #/EOC [id number] Status is pending  Insurance covers 14 tabs per 14 days.

## 2022-12-06 NOTE — Telephone Encounter (Signed)
Pharmacy Patient Advocate Encounter  Received notification from [facility] MEDICAID that Prior Authorization for VALACYCLOVIR has been  deemed N/A. Medication does not require a PA. Patient may fill 14 tablets per 14 days.   PA #/Case ID/Reference #: [id number]

## 2022-12-09 ENCOUNTER — Ambulatory Visit (HOSPITAL_COMMUNITY)
Admission: RE | Admit: 2022-12-09 | Discharge: 2022-12-09 | Disposition: A | Discharge status: Home / Self Care | Payer: Medicaid Other | Source: Ambulatory Visit | Attending: Cardiology | Admitting: Cardiology

## 2022-12-09 DIAGNOSIS — E851 Neuropathic heredofamilial amyloidosis: Secondary | ICD-10-CM | POA: Diagnosis not present

## 2022-12-09 DIAGNOSIS — G63 Polyneuropathy in diseases classified elsewhere: Secondary | ICD-10-CM | POA: Diagnosis not present

## 2022-12-09 MED ORDER — VUTRISIRAN SODIUM 25 MG/0.5ML ~~LOC~~ SOSY
25.0000 mg | PREFILLED_SYRINGE | Freq: Once | SUBCUTANEOUS | Status: AC
  Administered 2022-12-09: 25 mg via SUBCUTANEOUS
  Filled 2022-12-09: qty 0.5

## 2022-12-09 NOTE — Progress Notes (Signed)
    Advanced Heart Failure Clinic Note  PCP: Russell Simon, MD PCP-Cardiologist: Russell Magic, MD  Neurology: Russell Collier HF Cardiology: Russell Collier  HPI:  Russell Collier is a 62 y.o. 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    Echo 01/2018, EF 55-60%, nonobstructive cardiac cath 01/2018 (20% prox LAD, 60% mid Cx, 50% post atrio lesion) h/o bradycardia w/ ? 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    PYP scan 09/2018 and was an equivocal study. No visual increase in PYP uptake, but ratio was between 1-1.5. Based clinical suspicion, he was ordered to undergo genetic testing and results + for TTR gene.   He was seen by neurology, neuropathy associated with TTR.    Admitted 07/27/19 with atypical chest pain and wheezing. Troponins were low and flat, EKG reassuring. A CTA was performed, which showed a possible segmental PE. Since he does have a history of PE, he was continued on IV heparin and transitioned to Eliquis on 07/28/19. Echo 07/27/19 EF 55% RV normal.    At his HF follow up 12/2019 Eliquis was decreased due to frequent nose bleeds, mobility limited by back and knee pain.   S/p L3-S1 decompression 03/2020, uneventful hospitalization at [facility].   Graduated from paramedicine 09/2020.   Follow up with Neurology 03/2021, recommended starting Amvuttra and recommended restarting PT.   Echo 04/2021 EF 50-55% RV ok    Admitted 5/18-22/23 for RLE cellulitis. S/p RLE angiography with CO2, no intervention. New wound on LLE, and underwent LLE angiography with balloon angioplasty of left peroneal artery. Had LLE wound which is now healed.    At HF follow up 01/2022 Imdur discontinued and enalapril decreased to 10  mg nightly due to orthostasis.  Today he returns to HF clinic for injection of Amvuttra. Overall feeling well today. No contraindications to injection. Amvuttra administered in left arm.     Assessment/Plan: 1. Familial TTR Cardiac Amyloidosis:  - PYP scan 09/2018 was equivocal. + genetic testing. Has TTR gene, heterozygous.  - He is on tafamadis and referred for family genetic testing. No family members with TTR.  - Based on PYP scan not thought to have extensive cardiac involvement at this point.  - He has numerous neuropathic symptoms. Followed by Russell. Nita Collier in Neurology for neuropathy - Continue Tafamidis. 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 Patient tolerated injection well. Provided patient counseling on Amvuttra. Most common side effects are injection site reactions, arthralgias, dyspnea and vitamin A deficiency. Patient is aware to return to clinic every 3 months for repeat injection.   - Continue vitamin A supplement 8000 IU daily. Amvuttra decreases serum vitamin A levels.   Follow up 3 months for repeat Amvuttra injection.   Russell Collier, PharmD, BCPS, BCCP, CPP Heart Failure Clinic Pharmacist [phone]

## 2022-12-10 ENCOUNTER — Ambulatory Visit (INDEPENDENT_AMBULATORY_CARE_PROVIDER_SITE_OTHER): Payer: Medicaid Other | Admitting: Podiatry

## 2022-12-10 ENCOUNTER — Encounter: Payer: Self-pay | Admitting: Podiatry

## 2022-12-10 DIAGNOSIS — I739 Peripheral vascular disease, unspecified: Secondary | ICD-10-CM

## 2022-12-10 DIAGNOSIS — M79674 Pain in right toe(s): Secondary | ICD-10-CM | POA: Diagnosis not present

## 2022-12-10 DIAGNOSIS — B351 Tinea unguium: Secondary | ICD-10-CM

## 2022-12-10 DIAGNOSIS — E1151 Type 2 diabetes mellitus with diabetic peripheral angiopathy without gangrene: Secondary | ICD-10-CM

## 2022-12-10 DIAGNOSIS — M79675 Pain in left toe(s): Secondary | ICD-10-CM
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
## 2022-12-19 DIAGNOSIS — R1031 Right lower quadrant pain: Secondary | ICD-10-CM | POA: Diagnosis not present

## 2022-12-20 ENCOUNTER — Other Ambulatory Visit: Payer: Self-pay

## 2022-12-20 ENCOUNTER — Encounter (HOSPITAL_COMMUNITY): Payer: Self-pay

## 2022-12-20 ENCOUNTER — Emergency Department (HOSPITAL_COMMUNITY)
Admission: EM | Admit: 2022-12-20 | Discharge: 2022-12-21 | Discharge status: Against Medical Advice | Payer: Medicaid Other | Attending: Emergency Medicine | Admitting: Emergency Medicine

## 2022-12-20 DIAGNOSIS — S301XXA Contusion of abdominal wall, initial encounter: Secondary | ICD-10-CM | POA: Insufficient documentation

## 2022-12-20 DIAGNOSIS — Z6841 Body Mass Index (BMI) 40.0 and over, adult: Secondary | ICD-10-CM | POA: Insufficient documentation

## 2022-12-20 DIAGNOSIS — Z7901 Long term (current) use of anticoagulants: Secondary | ICD-10-CM | POA: Diagnosis not present

## 2022-12-20 DIAGNOSIS — E669 Obesity, unspecified: Secondary | ICD-10-CM | POA: Diagnosis not present

## 2022-12-20 DIAGNOSIS — X500XXA Overexertion from strenuous movement or load, initial encounter: Secondary | ICD-10-CM | POA: Insufficient documentation

## 2022-12-20 DIAGNOSIS — X58XXXA Exposure to other specified factors, initial encounter: Secondary | ICD-10-CM | POA: Insufficient documentation

## 2022-12-20 DIAGNOSIS — R1031 Right lower quadrant pain: Secondary | ICD-10-CM | POA: Diagnosis not present

## 2022-12-20 DIAGNOSIS — I10 Essential (primary) hypertension: Secondary | ICD-10-CM | POA: Diagnosis not present

## 2022-12-20 DIAGNOSIS — Z5321 Procedure and treatment not carried out due to patient leaving prior to being seen by health care provider: Secondary | ICD-10-CM | POA: Insufficient documentation

## 2022-12-20 DIAGNOSIS — R109 Unspecified abdominal pain: Secondary | ICD-10-CM | POA: Diagnosis not present

## 2022-12-20 DIAGNOSIS — S3991XA Unspecified injury of abdomen, initial encounter: Secondary | ICD-10-CM | POA: Diagnosis present

## 2022-12-20 NOTE — ED Provider Triage Note (Cosign Needed)
Emergency Medicine Provider Triage Evaluation Note  Teghan Schneekloth , a 62 y.o. male  was evaluated in triage.  Pt complains of right flank pain.  He noticed bruising to this area approximately 3 days ago.  [facility] and had negative workup.  States the bruise has continued to get larger.  Review of Systems  Positive: As above Negative: As above  Physical Exam  BP (!) 125/106 (BP Location: Right Arm)   Pulse 82   Temp 98.3 F (36.8 C)   Resp 17   Ht 5\' 10"  (1.778 m)   Wt 128.8 kg   SpO2 96%   BMI 40.75 kg/m  Gen:   Awake, no distress   Resp:  Normal effort  MSK:   Moves extremities without difficulty  Other:  Moderate-sized bruise to the right flank  Medical Decision Making  Medically screening exam initiated at 8:18 PM.  Appropriate orders placed.  Arshaun Rosengrant was informed that the remainder of the evaluation will be completed by another provider, this initial triage assessment does not replace that evaluation, and the importance of remaining in the ED until their evaluation is complete.  Workup initiated   Michelle Piper, Cordelia Poche 12/20/22 2019

## 2022-12-20 NOTE — ED Triage Notes (Signed)
Patient Russell Collier from home with complaint of bruising to right flank, denies trauma.  Reports takes blood thinners.  Started Friday & has progressively gotten bigger.  Patient evaluated at [facility] on 12/19/22 CT Angio Abdomen Pelvis completed & discharged.   Patient A & O x 4.

## 2022-12-21 ENCOUNTER — Encounter (HOSPITAL_COMMUNITY): Payer: Self-pay

## 2022-12-21 ENCOUNTER — Emergency Department (HOSPITAL_COMMUNITY)
Admission: EM | Admit: 2022-12-21 | Discharge: 2022-12-21 | Disposition: A | Discharge status: Home / Self Care | Payer: Medicaid Other | Source: Home / Self Care | Attending: Emergency Medicine | Admitting: Emergency Medicine

## 2022-12-21 ENCOUNTER — Other Ambulatory Visit: Payer: Self-pay

## 2022-12-21 DIAGNOSIS — E669 Obesity, unspecified: Secondary | ICD-10-CM | POA: Insufficient documentation

## 2022-12-21 DIAGNOSIS — Z6841 Body Mass Index (BMI) 40.0 and over, adult: Secondary | ICD-10-CM | POA: Insufficient documentation

## 2022-12-21 DIAGNOSIS — S301XXA Contusion of abdominal wall, initial encounter: Secondary | ICD-10-CM | POA: Insufficient documentation

## 2022-12-21 DIAGNOSIS — W228XXA Striking against or struck by other objects, initial encounter: Secondary | ICD-10-CM | POA: Insufficient documentation

## 2022-12-21 DIAGNOSIS — Z7901 Long term (current) use of anticoagulants: Secondary | ICD-10-CM | POA: Insufficient documentation

## 2022-12-21 DIAGNOSIS — R1031 Right lower quadrant pain: Secondary | ICD-10-CM | POA: Diagnosis not present

## 2022-12-21 DIAGNOSIS — R109 Unspecified abdominal pain: Secondary | ICD-10-CM

## 2022-12-21 LAB — COMPREHENSIVE METABOLIC PANEL
ALT: 22 U/L (ref 0–44)
AST: 27 U/L (ref 15–41)
Albumin: 3.9 g/dL (ref 3.5–5.0)
Alkaline Phosphatase: 66 U/L (ref 38–126)
Anion gap: 9 (ref 5–15)
BUN: 20 mg/dL (ref 8–23)
CO2: 26 mmol/L (ref 22–32)
Calcium: 9.1 mg/dL (ref 8.9–10.3)
Chloride: 106 mmol/L (ref 98–111)
Creatinine, Ser: 1.35 mg/dL — ABNORMAL HIGH (ref 0.61–1.24)
GFR, Estimated: 59 mL/min — ABNORMAL LOW (ref >60)
Glucose, Bld: 112 mg/dL — ABNORMAL HIGH (ref 70–99)
Potassium: 5.1 mmol/L (ref 3.5–5.1)
Sodium: 141 mmol/L (ref 135–145)
Total Bilirubin: 0.9 mg/dL (ref 0.3–1.2)
Total Protein: 7.1 g/dL (ref 6.5–8.1)

## 2022-12-21 LAB — CBC
HCT: 45.4 % (ref 39.0–52.0)
Hemoglobin: 14.8 g/dL (ref 13.0–17.0)
MCH: 30.8 pg (ref 26.0–34.0)
MCHC: 32.6 g/dL (ref 30.0–36.0)
MCV: 94.6 fL (ref 80.0–100.0)
Platelets: 190 10*3/uL (ref 150–400)
RBC: 4.8 MIL/uL (ref 4.22–5.81)
RDW: 16.3 % — ABNORMAL HIGH (ref 11.5–15.5)
WBC: 7.1 10*3/uL (ref 4.0–10.5)
nRBC: 0 % (ref 0.0–0.2)

## 2022-12-21 LAB — URINALYSIS, ROUTINE W REFLEX MICROSCOPIC
Bilirubin Urine: NEGATIVE
Glucose, UA: NEGATIVE mg/dL
Hgb urine dipstick: NEGATIVE
Ketones, ur: NEGATIVE mg/dL
Leukocytes,Ua: NEGATIVE
Nitrite: NEGATIVE
Protein, ur: NEGATIVE mg/dL
Specific Gravity, Urine: 1.018 (ref 1.005–1.030)
pH: 5 (ref 5.0–8.0)

## 2022-12-21 LAB — CBG MONITORING, ED: Glucose-Capillary: 101 mg/dL — ABNORMAL HIGH (ref 70–99)

## 2022-12-21 LAB — LIPASE, BLOOD: Lipase: 54 U/L — ABNORMAL HIGH (ref 11–51)

## 2022-12-21 MED ORDER — OXYCODONE HCL 5 MG PO TABS: 5.0000 mg | ORAL_TABLET | Freq: Three times a day (TID) | ORAL | 0 refills | Status: DC | PRN

## 2022-12-21 NOTE — ED Provider Notes (Signed)
[facility] AT [facility] Provider Note   CSN: [id number] Arrival date & time: 12/21/22  6578     History  Chief Complaint  Patient presents with   Flank Pain    Russell Collier is a 62 y.o. male presented to ED with complaint of bruising and pain in his right lower abdomen or flank.  Patient reports this began spontaneously on Thursday.  He denies any known trauma but says he was lifting a heavy speaker at the time.  He said he has some soreness which developed into a bruise on his right lower flank.  He went to [facility] 2 days ago per my review of external records, and at that time he had blood work including hemoglobin that was unremarkable and a CT angio of the abdomen which did not show any acute evidence of bleeding.  He was discharged home but returned last night complaining of persistent pain and feel that the bruise got larger.  The patient was initially discharged after he did not respond to his name being called in the lobby overnight.  This morning he was identified to still being in the lobby and was reenrolled and brought back to room to be evaluated by myself.  The patient ports he is on Eliquis and did take a dose last night but not this morning.  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  He has an appointment tomorrow with a vascular surgeon.  HPI     Home Medications Prior to Admission medications   Medication Sig Start Date End Date Taking? Authorizing Provider  oxyCODONE (ROXICODONE) 5 MG immediate release tablet Take 1 tablet (5 mg total) by mouth every 8 (eight) hours as needed for up to 12 doses for severe pain (pain score 7-10). 12/21/22  Yes Rayhana Slider, Kermit Balo, MD  acetaminophen (TYLENOL) 325 MG tablet Take 2 tablets (650 mg total) by mouth every 6 (six) hours as needed for mild pain or moderate pain. 07/13/21   Dameron, Nolberto Hanlon, DO  albuterol (PROVENTIL) (2.5 MG/3ML) 0.083%  nebulizer solution Take 3 mLs (2.5 mg total) by nebulization every 6 (six) hours as needed for wheezing or shortness of breath. 11/20/20   Cresenzo, Cyndi Lennert, MD  ANORO ELLIPTA 62.5-25 MCG/ACT AEPB INHALE 1 PUFF INTO THE LUNGS DAILY AS NEEDED. 11/23/21   Jerre Simon, MD  apixaban (ELIQUIS) 2.5 MG TABS tablet Take 1 tablet (2.5 mg total) by mouth 2 (two) times daily. 10/18/22   Jerre Simon, MD  betamethasone valerate ointment (VALISONE) 0.1 % Apply 1 Application topically 2 (two) times daily. 03/08/22   Jerre Simon, MD  Blood Glucose Monitoring Suppl DEVI 1 each by Does not apply route in the morning, at noon, and at bedtime. May substitute to any manufacturer covered by patient's insurance. 08/10/22   Valetta Close, MD  cephALEXin (KEFLEX) 500 MG capsule Take 1 capsule (500 mg total) by mouth 3 (three) times daily. 08/02/22   Al Decant, PA-C  clopidogrel (PLAVIX) 75 MG tablet Take 1 tablet (75 mg total) by mouth daily. 10/18/22 10/18/23  Jerre Simon, MD  colchicine 0.6 MG tablet Take 2 tablets (1.2 mg) and 1 hour later take a 0.6 mg and after the after that take 0.6 mg daily for 2 days. 11/08/22   Jerre Simon, MD  dapagliflozin propanediol (FARXIGA) 10 MG TABS tablet Take 1 tablet (10 mg total) by mouth daily before breakfast. 10/18/22  Jerre Simon, MD  enalapril (VASOTEC) 10 MG tablet Take 1 tablet (10 mg total) by mouth at bedtime. 10/18/22   Jerre Simon, MD  fluticasone (FLONASE) 50 MCG/ACT nasal spray PLACE 1 SPRAY INTO BOTH NOSTRILS DAILY. 11/02/21   Jerre Simon, MD  furosemide (LASIX) 40 MG tablet Take 1 tablet (40 mg total) by mouth 2 (two) times daily. 10/18/22   Jerre Simon, MD  ibuprofen (ADVIL) 200 MG tablet Take 600 mg by mouth every 6 (six) hours as needed for headache or moderate pain.    [provider]  lidocaine (LIDODERM) 5 % Place 1 patch onto the skin daily. Remove & Discard patch within 12 hours or as directed by MD 12/01/21   Nicanor Alcon, April, MD   multivitamin (ONE-A-DAY MEN'S) TABS tablet Take 1 tablet by mouth daily with breakfast.    [provider]  pantoprazole (PROTONIX) 40 MG tablet TAKE 1 TABLET (40 MG TOTAL) BY MOUTH DAILY. 11/04/22   Jerre Simon, MD  potassium chloride SA (KLOR-CON M) 20 MEQ tablet TAKE 2 TABLETS (40 MEQ TOTAL) BY MOUTH DAILY. 07/27/22   Quintella Reichert, MD  pregabalin (LYRICA) 100 MG capsule Take 1 capsule (100 mg total) by mouth 3 (three) times daily. 10/18/22   Jerre Simon, MD  rosuvastatin (CRESTOR) 10 MG tablet Take 1 tablet (10 mg total) by mouth daily. 10/18/22   Jerre Simon, MD  Skin Protectants, Misc. (MINERIN CREME) CREA Apply on affected area twice daily 01/19/22   Jerre Simon, MD  Tafamidis (VYNDAMAX) 61 MG CAPS TAKE 1 CAPSULE BY MOUTH DAILY. 10/29/22 10/29/23  [name], Bevelyn Buckles, MD  triamcinolone ointment (KENALOG) 0.5 % Apply 1 Application topically 2 (two) times daily. 01/18/22   Jerre Simon, MD  valACYclovir (VALTREX) 500 MG tablet TAKE 1 TABLET (500 MG TOTAL) BY MOUTH DAILY. 08/09/22   Jerre Simon, MD  VENTOLIN HFA 108 [phone] Base) MCG/ACT inhaler INHALE 2 PUFFS INTO THE LUNGS EVERY 6 (SIX) HOURS AS NEEDED FOR WHEEZING OR SHORTNESS OF BREATH. 07/01/22   Jerre Simon, MD  Vitamin A 2400 MCG (8000 UT) TABS Take 1 tablet by mouth daily. 05/21/21   [name], Bevelyn Buckles, MD  vutrisiran sodium (AMVUTTRA) 25 MG/0.5ML syringe Inject 0.5 mLs (25 mg total) into the skin every 3 (three) months. 05/01/21   [name], Bevelyn Buckles, MD      Allergies    Chantix [varenicline], Bupropion, Bactrim [sulfamethoxazole-trimethoprim], and Nicoderm [nicotine]    Review of Systems   Review of Systems  Physical Exam Updated Vital Signs BP 114/61 (BP Location: Right Arm)   Pulse (!) 58   Temp 98 F (36.7 C)   Resp 19   Ht 5\' 10"  (1.778 m)   Wt 128.8 kg   SpO2 99%   BMI 40.74 kg/m  Physical Exam Constitutional:      General: He is not in acute distress.    Appearance: He is obese.  HENT:     Head:  Normocephalic and atraumatic.  Eyes:     Conjunctiva/sclera: Conjunctivae normal.     Pupils: Pupils are equal, round, and reactive to light.  Cardiovascular:     Rate and Rhythm: Normal rate and regular rhythm.  Pulmonary:     Effort: Pulmonary effort is normal. No respiratory distress.  Abdominal:     General: There is no distension.     Tenderness: There is no abdominal tenderness.  Musculoskeletal:     Comments: Small approximate palmar size region of ecchymoses to the right flank that is  tender to palpation with some very mild underlying induration without fluctuance  Skin:    General: Skin is warm and dry.  Neurological:     General: No focal deficit present.     Mental Status: He is alert. Mental status is at baseline.  Psychiatric:        Mood and Affect: Mood normal.        Behavior: Behavior normal.     ED Results / Procedures / Treatments   Labs (all labs ordered are listed, but only abnormal results are displayed) Labs Reviewed  LIPASE, BLOOD - Abnormal; Notable for the following components:      Result Value   Lipase 54 (*)    All other components within normal limits  COMPREHENSIVE METABOLIC PANEL - Abnormal; Notable for the following components:   Glucose, Bld 112 (*)    Creatinine, Ser 1.35 (*)    GFR, Estimated 59 (*)    All other components within normal limits  CBC - Abnormal; Notable for the following components:   RDW 16.3 (*)    All other components within normal limits  CBG MONITORING, ED - Abnormal; Notable for the following components:   Glucose-Capillary 101 (*)    All other components within normal limits  URINALYSIS, ROUTINE W REFLEX MICROSCOPIC    EKG None  Radiology No results found.  Procedures Procedures    Medications Ordered in ED Medications - No data to display  ED Course/ Medical Decision Making/ A&P                                 Medical Decision Making Amount and/or Complexity of Data Reviewed Labs:  ordered.  Risk Prescription drug management.   Patient is here with pain and tenderness on the right side of the abdomen and ecchymosis.  I strongly suspect this is a small abdominal wall hematoma which may have been related to a small arterial tear or muscle tear.  I explained to the patient this can be quite a painful condition and can last 4 to 8 weeks for recovery.  I rechecked the patient's labs this morning and hemoglobin is stable and normal, very low suspicion for acute GI or intra-abdominal hemorrhage.  I reviewed his external records including his workup and CT angio [facility] 2 days ago and did not find any emergent findings.  I do not see indication for repeat CT scan.  Very low suspicion for ruptured AAA, acute pancreatitis, or renal injury.  I advised the patient to avoid using NSAIDs including Aleve while he is on Eliquis.  This may be contributing to his additional thin blood.  He verbalized understanding.  He will continue Tylenol at home and I will provide him some oxycodone for breakthrough pain.  He can follow-up with the vascular specialist tomorrow but he is otherwise stable for discharge.        Final Clinical Impression(s) / ED Diagnoses Final diagnoses:  Flank pain  Abdominal wall hematoma, initial encounter    Rx / DC Orders ED Discharge Orders          Ordered    oxyCODONE (ROXICODONE) 5 MG immediate release tablet  Every 8 hours PRN        12/21/22 0919              Terald Sleeper, MD 12/21/22 [phone]

## 2022-12-21 NOTE — ED Notes (Signed)
Pt verbalized understanding of discharge instructions. Pt ambulatory at time of discharge. Pt wheeled from ed . Family to drive home.

## 2022-12-21 NOTE — ED Triage Notes (Addendum)
Pt previously checked in and was taken out of the system d/t falling asleep in lobby and not answering when his name was called. Pt c/o right flank pain. Pt has large bruise to right side that started 4 days ago and has progressively gotten bigger. Pt states his urine is dark. Denies trauma.

## 2022-12-21 NOTE — Discharge Instructions (Addendum)
You have a small amount of bleeding along your abdominal wall which is called a hematoma.  You can review the information included.  You can apply ice as needed to the spot for the next several days.  These types of injuries can be slow to heal, usually over 4 to 8 weeks.  It can cause a lot of soreness and discomfort.  You continue taking Tylenol regularly for pain.  I prescribed you oxycodone which is a narcotic for severe breakthrough pain.  You should try to avoid NSAID medicines, including Aleve, Motrin, Advil and ibuprofen, while you are taking Eliquis.  These types of medicines can further thin your blood.  I advised that you skip your morning dose of Eliquis today and then resume it tonight.  You can continue her other medications regularly.  Follow-up with your vascular specialist tomorrow in the office.

## 2022-12-21 NOTE — ED Notes (Signed)
Pt name was called 3x at 0438, pt did not answer.

## 2022-12-22 ENCOUNTER — Ambulatory Visit: Payer: Medicaid Other | Admitting: Physician Assistant

## 2022-12-22 ENCOUNTER — Other Ambulatory Visit (HOSPITAL_COMMUNITY): Payer: Self-pay

## 2022-12-22 ENCOUNTER — Ambulatory Visit (HOSPITAL_COMMUNITY)
Admission: RE | Admit: 2022-12-22 | Discharge: 2022-12-22 | Disposition: A | Discharge status: Home / Self Care | Payer: Medicaid Other | Source: Ambulatory Visit | Attending: Vascular Surgery | Admitting: Vascular Surgery

## 2022-12-22 ENCOUNTER — Other Ambulatory Visit (HOSPITAL_COMMUNITY): Payer: Self-pay | Admitting: Pharmacy Technician

## 2022-12-22 ENCOUNTER — Ambulatory Visit (INDEPENDENT_AMBULATORY_CARE_PROVIDER_SITE_OTHER)
Admission: RE | Admit: 2022-12-22 | Discharge: 2022-12-22 | Disposition: A | Discharge status: Home / Self Care | Payer: Medicaid Other | Source: Ambulatory Visit | Attending: Vascular Surgery | Admitting: Vascular Surgery

## 2022-12-22 VITALS — BP 147/78 | HR 83 | Temp 97.6°F | Ht 70.0 in | Wt 282.8 lb

## 2022-12-22 DIAGNOSIS — I739 Peripheral vascular disease, unspecified: Secondary | ICD-10-CM

## 2022-12-22 DIAGNOSIS — I70222 Atherosclerosis of native arteries of extremities with rest pain, left leg: Secondary | ICD-10-CM | POA: Diagnosis not present

## 2022-12-22 LAB — VAS US ABI WITH/WO TBI
Left ABI: 0.85
Right ABI: 0.79
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
## 2022-12-22 NOTE — Progress Notes (Signed)
Specialty Pharmacy Refill Coordination Note  Russell Collier is a 62 y.o. male contacted today regarding refills of specialty medication(s) Tafamidis   Patient requested Delivery   Delivery date: 12/30/22   Verified address: [address]  [place]   Medication will be filled on 12/29/22.

## 2022-12-30 ENCOUNTER — Encounter (HOSPITAL_COMMUNITY): Payer: Medicaid Other

## 2022-12-30 ENCOUNTER — Ambulatory Visit: Payer: Medicaid Other

## 2023-01-04 ENCOUNTER — Encounter: Payer: Self-pay | Admitting: Family Medicine

## 2023-01-04 ENCOUNTER — Ambulatory Visit (INDEPENDENT_AMBULATORY_CARE_PROVIDER_SITE_OTHER): Payer: Medicaid Other | Admitting: Family Medicine

## 2023-01-04 VITALS — BP 110/65 | HR 72 | Ht 70.0 in | Wt 279.2 lb

## 2023-01-04 DIAGNOSIS — B009 Herpesviral infection, unspecified: Secondary | ICD-10-CM | POA: Diagnosis not present

## 2023-01-04 DIAGNOSIS — S3013XD Contusion of flank (latus) region, subsequent encounter: Secondary | ICD-10-CM | POA: Insufficient documentation

## 2023-01-04 DIAGNOSIS — S301XXD Contusion of abdominal wall, subsequent encounter: Secondary | ICD-10-CM

## 2023-01-04 MED ORDER — VALACYCLOVIR HCL 500 MG PO TABS: 500.0000 mg | ORAL_TABLET | Freq: Every day | ORAL | 3 refills | Status: DC

## 2023-01-04 MED ORDER — VALACYCLOVIR HCL 500 MG PO TABS: 500.0000 mg | ORAL_TABLET | Freq: Every day | ORAL | 11 refills | Status: DC

## 2023-01-04 NOTE — Patient Instructions (Signed)
Your side bruising was likely a hematoma. I am happy that this has gotten better. Continue to keep [place] updated, but it should continue to work better. I will chat with Dr. Elliot Gurney about your valacyclovir and get all of your medicines sent to the [facility] on [address].

## 2023-01-04 NOTE — Assessment & Plan Note (Addendum)
Much improved.  Still has pain to area though overall comfortable in exam.  Advised can continue to take time to resorb.  Most likely traumatic while lifting heavy equipment.  Was less likely retroperitoneal bleed given CT angio findings as well as stable vitals today.  Continue Tylenol as needed for pain.  Follow-up if worsening.

## 2023-01-04 NOTE — Assessment & Plan Note (Addendum)
Will send in 30-day supply of Valtrex 500 mg daily.  Per patient's records and report, looks like he was on 500 mg daily, though last A/P from PCP in 12/2021 stated 1000 mg daily.  Also appears prior authorization was sent in 12/31/2022; document from pharmacy is scanned to media tab for review.  Will send message to PCP to look into recent prior authorization.

## 2023-01-04 NOTE — Progress Notes (Signed)
    SUBJECTIVE:   CHIEF COMPLAINT / HPI:   Hospital follow up Seen 12/21/2022 in the ED for right lower back/flank pain.  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  He is on multiple blood thinners for cardiac history.  The area of pain had a large bruise.  He went to [facility] and had a CT angio the abdomen which does not show any acute bleeding.  He presented to the ED with increasing pain.  At that time, felt this was a abdominal wall hematoma and was told it could take multiple weeks for recovery.  He was given Tylenol and oxycodone for breakthrough pain. Today, pain is continued on the area, but bruising is much improved.  He still feels a little area over over his flank that is painful to the touch.  HSV Has been on Valtrex 500 mg daily for suppressive therapy.  Has been having difficulties obtaining this through his pharmacy.  He thinks needs a prior authorization.  He has been understandably frustrated at this process of getting it covered.  PERTINENT  PMH / PSH: Pulm hypertension, hypertension, CAD, type 2 diabetes, CHF, OSA, COPD, lumbar radiculopathy, tobacco use, elevated BMI, HLD  OBJECTIVE:   BP 110/65   Pulse 72   Ht 5\' 10"  (1.778 m)   Wt 279 lb 3.2 oz (126.6 kg)   SpO2 100%   BMI 40.06 kg/m   General: Alert and oriented, in NAD Skin: Warm, dry, and intact; no bruising over right flank though small rounded area felt on deep palpation of right lower flank associated with tenderness with palpation HEENT: NCAT, EOM grossly normal, midline nasal septum Cardiac: Regular rate Respiratory: CTAB, breathing and speaking comfortably on RA Abdominal: Soft, nontender, nondistended, normoactive bowel sounds Extremities: Moves all extremities grossly equally Neurological: No gross focal deficit Psychiatric: Appropriate mood and affect   ASSESSMENT/PLAN:   Right flank hematoma, subsequent encounter Much improved.  Still has pain to area though overall  comfortable in exam.  Advised can continue to take time to resorb.  Most likely traumatic while lifting heavy equipment.  Was less likely retroperitoneal bleed given CT angio findings as well as stable vitals today.  Continue Tylenol as needed for pain.  Follow-up if worsening.  HSV (herpes simplex virus) infection Will send in 30-day supply of Valtrex 500 mg daily.  Per patient's records and report, looks like he was on 500 mg daily, though last A/P from PCP in 12/2021 stated 1000 mg daily.  Also appears prior authorization was sent in 12/31/2022; document from pharmacy is scanned to media tab for review.  Will send message to PCP to look into recent prior authorization.    Janeal Holmes, MD [facility]

## 2023-01-07 ENCOUNTER — Other Ambulatory Visit: Payer: Self-pay

## 2023-01-07 DIAGNOSIS — I739 Peripheral vascular disease, unspecified: Secondary | ICD-10-CM

## 2023-01-13 ENCOUNTER — Other Ambulatory Visit: Payer: Self-pay

## 2023-01-17 ENCOUNTER — Other Ambulatory Visit (HOSPITAL_COMMUNITY): Payer: Self-pay

## 2023-01-17 ENCOUNTER — Other Ambulatory Visit: Payer: Self-pay

## 2023-01-17 DIAGNOSIS — G4733 Obstructive sleep apnea (adult) (pediatric): Secondary | ICD-10-CM

## 2023-01-17 DIAGNOSIS — I5030 Unspecified diastolic (congestive) heart failure: Secondary | ICD-10-CM

## 2023-01-17 DIAGNOSIS — I5032 Chronic diastolic (congestive) heart failure: Secondary | ICD-10-CM

## 2023-01-17 DIAGNOSIS — I10 Essential (primary) hypertension: Secondary | ICD-10-CM

## 2023-01-17 MED ORDER — FUROSEMIDE 40 MG PO TABS: 40.0000 mg | ORAL_TABLET | Freq: Two times a day (BID) | ORAL | 3 refills | Status: DC

## 2023-01-17 MED ORDER — APIXABAN 2.5 MG PO TABS: 2.5000 mg | ORAL_TABLET | Freq: Two times a day (BID) | ORAL | 3 refills | Status: DC

## 2023-01-17 MED ORDER — ENALAPRIL MALEATE 10 MG PO TABS: 10.0000 mg | ORAL_TABLET | Freq: Every day | ORAL | 3 refills | Status: DC

## 2023-01-17 MED ORDER — DAPAGLIFLOZIN PROPANEDIOL 10 MG PO TABS: 10.0000 mg | ORAL_TABLET | Freq: Every day | ORAL | 3 refills | Status: DC

## 2023-01-17 MED ORDER — POTASSIUM CHLORIDE CRYS ER 20 MEQ PO TBCR: 40.0000 meq | EXTENDED_RELEASE_TABLET | Freq: Every day | ORAL | 1 refills | Status: DC

## 2023-01-17 NOTE — Progress Notes (Signed)
Specialty Pharmacy Refill Coordination Note  Russell Collier is a 62 y.o. male contacted today regarding refills of specialty medication(s) Tafamidis   Patient requested Delivery   Delivery date: 01/28/23   Verified address: [address] [place]   Medication will be filled on 01/27/23.

## 2023-01-27 ENCOUNTER — Other Ambulatory Visit: Payer: Self-pay

## 2023-02-11 ENCOUNTER — Encounter (HOSPITAL_COMMUNITY): Payer: Self-pay | Admitting: Internal Medicine

## 2023-02-11 ENCOUNTER — Ambulatory Visit (HOSPITAL_COMMUNITY)
Admission: RE | Admit: 2023-02-11 | Discharge: 2023-02-11 | Disposition: A | Discharge status: Home / Self Care | Payer: Medicaid Other | Source: Ambulatory Visit | Attending: Internal Medicine | Admitting: Internal Medicine

## 2023-02-11 VITALS — BP 132/88 | HR 88 | Ht 70.0 in | Wt 274.4 lb

## 2023-02-11 DIAGNOSIS — E854 Organ-limited amyloidosis: Secondary | ICD-10-CM | POA: Diagnosis present

## 2023-02-11 DIAGNOSIS — G4733 Obstructive sleep apnea (adult) (pediatric): Secondary | ICD-10-CM | POA: Insufficient documentation

## 2023-02-11 DIAGNOSIS — Z6839 Body mass index (BMI) 39.0-39.9, adult: Secondary | ICD-10-CM | POA: Diagnosis not present

## 2023-02-11 DIAGNOSIS — E875 Hyperkalemia: Secondary | ICD-10-CM | POA: Insufficient documentation

## 2023-02-11 DIAGNOSIS — G629 Polyneuropathy, unspecified: Secondary | ICD-10-CM | POA: Diagnosis not present

## 2023-02-11 DIAGNOSIS — Z86718 Personal history of other venous thrombosis and embolism: Secondary | ICD-10-CM | POA: Diagnosis not present

## 2023-02-11 DIAGNOSIS — I110 Hypertensive heart disease with heart failure: Secondary | ICD-10-CM | POA: Diagnosis not present

## 2023-02-11 DIAGNOSIS — I739 Peripheral vascular disease, unspecified: Secondary | ICD-10-CM | POA: Insufficient documentation

## 2023-02-11 DIAGNOSIS — E669 Obesity, unspecified: Secondary | ICD-10-CM | POA: Diagnosis not present

## 2023-02-11 DIAGNOSIS — N62 Hypertrophy of breast: Secondary | ICD-10-CM | POA: Insufficient documentation

## 2023-02-11 DIAGNOSIS — E851 Neuropathic heredofamilial amyloidosis: Secondary | ICD-10-CM

## 2023-02-11 DIAGNOSIS — I493 Ventricular premature depolarization: Secondary | ICD-10-CM | POA: Diagnosis not present

## 2023-02-11 DIAGNOSIS — G63 Polyneuropathy in diseases classified elsewhere: Secondary | ICD-10-CM

## 2023-02-11 DIAGNOSIS — I5032 Chronic diastolic (congestive) heart failure: Secondary | ICD-10-CM | POA: Diagnosis not present

## 2023-02-11 DIAGNOSIS — I43 Cardiomyopathy in diseases classified elsewhere: Secondary | ICD-10-CM | POA: Insufficient documentation

## 2023-02-11 DIAGNOSIS — E119 Type 2 diabetes mellitus without complications: Secondary | ICD-10-CM | POA: Insufficient documentation

## 2023-02-11 DIAGNOSIS — Z79899 Other long term (current) drug therapy: Secondary | ICD-10-CM | POA: Diagnosis not present

## 2023-02-11 DIAGNOSIS — Z955 Presence of coronary angioplasty implant and graft: Secondary | ICD-10-CM | POA: Insufficient documentation

## 2023-02-11 DIAGNOSIS — Z720 Tobacco use: Secondary | ICD-10-CM | POA: Diagnosis not present

## 2023-02-11 DIAGNOSIS — R000 Tachycardia, unspecified: Secondary | ICD-10-CM | POA: Insufficient documentation

## 2023-02-11 DIAGNOSIS — R002 Palpitations: Secondary | ICD-10-CM | POA: Insufficient documentation

## 2023-02-11 DIAGNOSIS — Z7901 Long term (current) use of anticoagulants: Secondary | ICD-10-CM | POA: Diagnosis not present

## 2023-02-11 DIAGNOSIS — I2510 Atherosclerotic heart disease of native coronary artery without angina pectoris: Secondary | ICD-10-CM | POA: Diagnosis not present

## 2023-02-11 NOTE — Patient Instructions (Signed)
Great to see you today!!!  Continue current medications  Your physician recommends that you schedule a follow-up appointment in: 6 months  If you have any questions or concerns before your next appointment please send [place] a message through [place] or call our office at [phone].    TO LEAVE A MESSAGE FOR THE NURSE SELECT OPTION 2, PLEASE LEAVE A MESSAGE INCLUDING: YOUR NAME DATE OF BIRTH CALL BACK NUMBER REASON FOR CALL**this is important as we prioritize the call backs  YOU WILL RECEIVE A CALL BACK THE SAME DAY AS LONG AS YOU CALL BEFORE 4:00 PM  At the [facility], you and your health needs are our priority. As part of our continuing mission to provide you with exceptional heart care, we have created designated Provider Care Teams. These Care Teams include your primary Cardiologist (physician) and Advanced Practice Providers (APPs- Physician Assistants and Nurse Practitioners) who all work together to provide you with the care you need, when you need it.   You may see any of the following providers on your designated Care Team at your next follow up: Dr Arvilla Meres Dr Marca Ancona Dr. Dorthula Nettles Dr. Clearnce Hasten Amy Filbert Schilder, NP Robbie Lis, [place] [facility] [place], [place] Brynda Peon, NP [place] Lee, NP Karle Plumber, PharmD   Please be sure to bring in all your medications bottles to every appointment.    Thank you for choosing [facility]
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
## 2023-02-15 ENCOUNTER — Other Ambulatory Visit: Payer: Self-pay

## 2023-02-15 NOTE — Progress Notes (Signed)
Specialty Pharmacy Refill Coordination Note  Russell Collier is a 62 y.o. male contacted today regarding refills of specialty medication(s) Tafamidis Jeannie Fend)   Patient requested Delivery   Delivery date: 02/24/23   Verified address: [address] [place]   Medication will be filled on 02/22/23.

## 2023-02-21 ENCOUNTER — Other Ambulatory Visit: Payer: Self-pay | Admitting: Student

## 2023-02-21 DIAGNOSIS — L309 Dermatitis, unspecified: Secondary | ICD-10-CM

## 2023-02-21 DIAGNOSIS — L97909 Non-pressure chronic ulcer of unspecified part of unspecified lower leg with unspecified severity: Secondary | ICD-10-CM

## 2023-02-21 DIAGNOSIS — M109 Gout, unspecified: Secondary | ICD-10-CM

## 2023-02-21 NOTE — Telephone Encounter (Signed)
Patient came in stating he needs refills on his Rosuvastatin and Valacyclovir please. Wants to make sure all his prescriptions are going to the [facility] on [address], he is no longer using [facility]. He would like to take [facility] out of his chart please.

## 2023-02-22 ENCOUNTER — Other Ambulatory Visit: Payer: Self-pay

## 2023-02-24 ENCOUNTER — Other Ambulatory Visit: Payer: Self-pay | Admitting: Student

## 2023-02-24 ENCOUNTER — Other Ambulatory Visit (HOSPITAL_COMMUNITY): Payer: Self-pay

## 2023-02-24 MED ORDER — VALACYCLOVIR HCL 500 MG PO TABS: 500.0000 mg | ORAL_TABLET | Freq: Every day | ORAL | 11 refills | Status: DC

## 2023-02-24 MED ORDER — ROSUVASTATIN CALCIUM 10 MG PO TABS: 10.0000 mg | ORAL_TABLET | Freq: Every day | ORAL | 3 refills | Status: DC

## 2023-02-24 NOTE — Progress Notes (Signed)
 Rosuvastatin and Valacyclovir sent to [facility].

## 2023-02-24 NOTE — Telephone Encounter (Signed)
 Patient calls nurse line requesting pharmacy transfer.   He reports he is no longer using [facility] and only [facility] on [address] now.   I have updated his preferred pharmacy.  Please send scripts for pended medications over to [facility].

## 2023-02-28 MED ORDER — TRIAMCINOLONE ACETONIDE 0.5 % EX OINT: 1.0000 | TOPICAL_OINTMENT | Freq: Two times a day (BID) | CUTANEOUS | 0 refills | Status: AC

## 2023-02-28 MED ORDER — CLOPIDOGREL BISULFATE 75 MG PO TABS: 75.0000 mg | ORAL_TABLET | Freq: Every day | ORAL | 3 refills | Status: DC

## 2023-02-28 MED ORDER — VENTOLIN HFA 108 (90 BASE) MCG/ACT IN AERS: 2.0000 | INHALATION_SPRAY | Freq: Four times a day (QID) | RESPIRATORY_TRACT | 3 refills | Status: DC | PRN

## 2023-02-28 MED ORDER — ANORO ELLIPTA 62.5-25 MCG/ACT IN AEPB: 1.0000 | INHALATION_SPRAY | Freq: Every day | RESPIRATORY_TRACT | 6 refills | Status: DC

## 2023-02-28 MED ORDER — MINERIN CREME EX CREA: TOPICAL_CREAM | CUTANEOUS | 0 refills | Status: DC

## 2023-02-28 MED ORDER — FLUTICASONE PROPIONATE 50 MCG/ACT NA SUSP: 1.0000 | Freq: Every day | NASAL | 11 refills | Status: DC

## 2023-02-28 MED ORDER — PANTOPRAZOLE SODIUM 40 MG PO TBEC: 40.0000 mg | DELAYED_RELEASE_TABLET | Freq: Every day | ORAL | 3 refills | Status: DC

## 2023-02-28 MED ORDER — PREGABALIN 100 MG PO CAPS: 100.0000 mg | ORAL_CAPSULE | Freq: Three times a day (TID) | ORAL | 3 refills | Status: DC

## 2023-02-28 MED ORDER — BETAMETHASONE VALERATE 0.1 % EX OINT: 1.0000 | TOPICAL_OINTMENT | Freq: Two times a day (BID) | CUTANEOUS | 0 refills | Status: DC

## 2023-02-28 MED ORDER — ROSUVASTATIN CALCIUM 10 MG PO TABS: 10.0000 mg | ORAL_TABLET | Freq: Every day | ORAL | 3 refills | Status: DC

## 2023-02-28 MED ORDER — COLCHICINE 0.6 MG PO TABS: ORAL_TABLET | ORAL | 0 refills | Status: DC

## 2023-03-10 ENCOUNTER — Encounter: Payer: Self-pay | Admitting: Podiatry

## 2023-03-10 ENCOUNTER — Ambulatory Visit (HOSPITAL_COMMUNITY)
Admission: RE | Admit: 2023-03-10 | Discharge: 2023-03-10 | Disposition: A | Discharge status: Home / Self Care | Payer: Medicaid Other | Source: Ambulatory Visit | Attending: Cardiology | Admitting: Cardiology

## 2023-03-10 ENCOUNTER — Ambulatory Visit: Payer: Medicaid Other | Admitting: Podiatry

## 2023-03-10 DIAGNOSIS — B351 Tinea unguium: Secondary | ICD-10-CM | POA: Insufficient documentation

## 2023-03-10 DIAGNOSIS — I110 Hypertensive heart disease with heart failure: Secondary | ICD-10-CM | POA: Insufficient documentation

## 2023-03-10 DIAGNOSIS — E1151 Type 2 diabetes mellitus with diabetic peripheral angiopathy without gangrene: Secondary | ICD-10-CM | POA: Diagnosis not present

## 2023-03-10 DIAGNOSIS — M79675 Pain in left toe(s): Secondary | ICD-10-CM | POA: Insufficient documentation

## 2023-03-10 DIAGNOSIS — M79674 Pain in right toe(s): Secondary | ICD-10-CM | POA: Diagnosis not present

## 2023-03-10 DIAGNOSIS — Z86711 Personal history of pulmonary embolism: Secondary | ICD-10-CM | POA: Diagnosis not present

## 2023-03-10 DIAGNOSIS — F17210 Nicotine dependence, cigarettes, uncomplicated: Secondary | ICD-10-CM | POA: Insufficient documentation

## 2023-03-10 DIAGNOSIS — I739 Peripheral vascular disease, unspecified: Secondary | ICD-10-CM | POA: Diagnosis not present

## 2023-03-10 DIAGNOSIS — Z0189 Encounter for other specified special examinations: Secondary | ICD-10-CM

## 2023-03-10 DIAGNOSIS — E119 Type 2 diabetes mellitus without complications: Secondary | ICD-10-CM | POA: Diagnosis not present

## 2023-03-10 DIAGNOSIS — I5030 Unspecified diastolic (congestive) heart failure: Secondary | ICD-10-CM | POA: Diagnosis not present

## 2023-03-10 DIAGNOSIS — G63 Polyneuropathy in diseases classified elsewhere: Secondary | ICD-10-CM | POA: Diagnosis not present

## 2023-03-10 DIAGNOSIS — E854 Organ-limited amyloidosis: Secondary | ICD-10-CM | POA: Diagnosis present

## 2023-03-10 DIAGNOSIS — E851 Neuropathic heredofamilial amyloidosis: Secondary | ICD-10-CM

## 2023-03-10 DIAGNOSIS — Z86718 Personal history of other venous thrombosis and embolism: Secondary | ICD-10-CM | POA: Diagnosis not present

## 2023-03-10 MED ORDER — VUTRISIRAN SODIUM 25 MG/0.5ML ~~LOC~~ SOSY
25.0000 mg | PREFILLED_SYRINGE | Freq: Once | SUBCUTANEOUS | Status: AC
  Administered 2023-03-10: 25 mg via SUBCUTANEOUS
  Filled 2023-03-10: qty 0.5
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
## 2023-03-10 NOTE — Patient Instructions (Signed)
It was a pleasure seeing you today!  MEDICATIONS: -No medication changes today -Call if you have questions about your medications.   NEXT APPOINTMENT: Return to clinic in 3 months for repeat injection.  In general, to take care of your heart failure: -Limit your fluid intake to 2 Liters (half-gallon) per day.   -Limit your salt intake to ideally 2-3 grams (2000-3000 mg) per day. -Weigh yourself daily and record, and bring that "weight diary" to your next appointment.  (Weight gain of 2-3 pounds in 1 day typically means fluid weight.) -The medications for your heart are to help your heart and help you live longer.   -Please contact us before stopping any of your heart medications.  Call the clinic at [phone] with questions or to reschedule future appointments.

## 2023-03-10 NOTE — Progress Notes (Signed)
Advanced Heart Failure Clinic Note  PCP: Jerre Simon, MD PCP-Cardiologist: Armanda Magic, MD  Neurology: Dr Allena Katz HF Cardiology: Dr. Gala Romney  HPI:  [name] is a 63 y.o. 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    Echo 01/2018, EF 55-60%, nonobstructive cardiac cath 01/2018 (20% prox LAD, 60% mid Cx, 50% post atrio lesion) h/o bradycardia w/ ? 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    PYP scan 09/2018 and was an equivocal study. No visual increase in PYP uptake, but ratio was between 1-1.5. Based clinical suspicion, he was ordered to undergo genetic testing and results + for TTR gene.   He was seen by neurology, neuropathy associated with TTR.    Admitted 07/27/19 with atypical chest pain and wheezing. Troponins were low and flat, EKG reassuring. A CTA was performed, which showed a possible segmental PE. Since he does have a history of PE, he was continued on IV heparin and transitioned to Eliquis on 07/28/19. Echo 07/27/19 EF 55% RV normal.    At his HF follow up 12/2019 Eliquis was decreased due to frequent nose bleeds, mobility limited by back and knee pain.   S/p L3-S1 decompression 03/2020, uneventful hospitalization at [facility].   Graduated from paramedicine 09/2020.   Follow up with Neurology 03/2021, recommended starting Amvuttra and recommended restarting PT.   Echo 04/2021 EF 50-55% RV ok    Admitted 5/18-22/23 for RLE cellulitis. S/p RLE angiography with CO2, no intervention. New wound on LLE, and underwent LLE angiography with balloon angioplasty of left peroneal artery. Had LLE wound which is now healed.    At HF follow up 01/2022 Imdur discontinued and enalapril decreased to 10  mg nightly due to orthostasis.  Today he returns to HF clinic for injection of Amvuttra. Overall feeling well today. No contraindications to injection. Amvuttra administered in left arm. After injection given, developed pain at the injection site 5 minutes later. Said it felt like his whole upper arm was hurting. Patient states he has never had any pain with injection before. Pain improved towards end of visit.     Assessment/Plan: 1. Familial TTR Cardiac Amyloidosis:  - PYP scan 09/2018 was equivocal. + genetic testing. Has TTR gene, heterozygous.  - He is on tafamadis and referred for family genetic testing. No family members with TTR.  - Based on PYP scan not thought to have extensive cardiac involvement at this point.  - He has numerous neuropathic symptoms. Followed by Dr. Nita Sickle in Neurology for neuropathy - Continue Tafamidis. 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 Patient noted significant pain at the injection site after injection was administered today. Pain started about 5 minutes after injection was given and affected his upper arm. Pain did improve by the end of the visit, but he will let us know if it returns and is severe. He will take tylenol to help with the pain. Provided patient counseling on Amvuttra. Most common side effects are injection site reactions, arthralgias, dyspnea and vitamin A deficiency. Patient is aware to return to clinic every 3 months for repeat injection.   - Continue vitamin A supplement 8000 IU daily. Amvuttra decreases serum vitamin A levels.   Follow  up 3 months for repeat Amvuttra injection.   Karle Plumber, PharmD, BCPS, BCCP, CPP Heart Failure Clinic Pharmacist [phone]

## 2023-03-11 ENCOUNTER — Ambulatory Visit (INDEPENDENT_AMBULATORY_CARE_PROVIDER_SITE_OTHER): Payer: Medicaid Other | Admitting: Student

## 2023-03-11 ENCOUNTER — Encounter: Payer: Self-pay | Admitting: Student

## 2023-03-11 VITALS — BP 113/71 | HR 79 | Ht 70.0 in | Wt 281.8 lb

## 2023-03-11 DIAGNOSIS — E1151 Type 2 diabetes mellitus with diabetic peripheral angiopathy without gangrene: Secondary | ICD-10-CM

## 2023-03-11 DIAGNOSIS — Z1211 Encounter for screening for malignant neoplasm of colon: Secondary | ICD-10-CM | POA: Diagnosis not present

## 2023-03-11 MED ORDER — TIRZEPATIDE-WEIGHT MANAGEMENT 2.5 MG/0.5ML ~~LOC~~ SOLN: 2.5000 mg | SUBCUTANEOUS | 2 refills | Status: DC

## 2023-03-11 NOTE — Progress Notes (Signed)
    SUBJECTIVE:   CHIEF COMPLAINT / HPI: T9DM  63 year old male with history of type 2 diabetes Presenting today for diabetes follow-up Current medication include Farxiga Last A1c 5 months ago was 5.6. Currently on Crestor 10mg  daily and enalapril 10 mg daily. Due for annual diabetic eye exam   PERTINENT  PMH / PSH: T2DM and HF and obesity  OBJECTIVE:   BP 113/71   Pulse 79   Ht 5\' 10"  (1.778 m)   Wt 281 lb 12.8 oz (127.8 kg)   SpO2 99%   BMI 40.43 kg/m    Physical Exam General: Alert, well appearing, NAD Cardiovascular: RRR, No Murmurs, Normal S2/S2 Respiratory: CTAB, No wheezing or Rales Abdomen: No distension or tenderness Extremities: No edema on extremities   Skin: Warm and dry  ASSESSMENT/PLAN:   Type 2 diabetes mellitus with diabetic peripheral angiopathy without gangrene, without long-term current use of insulin (HCC) Well-controlled diabetes with A1c of 5.6 from 5 months ago.  Given patient's history of T2DM, HF and obesity he will most likely benefit from starting GLP-1.  Rx 2.5 mg of Zepbound weekly with plans to titrate up to 5mg  in 2 weeks if tolerated.  Risk and benefit discussed with patient who verbalized understanding and agreeable to plan.  Encouraged patient to follow-up with his ophthalmologist for his annual diabetic eye exam.   Health Maintenance  Patient is due for colonoscopy and shingles. -Patient advised to complete his shingles vaccine at his pharmacy -Ambulatory GI referral placed for colonoscopy   Jerre Simon, MD [facility]

## 2023-03-11 NOTE — Assessment & Plan Note (Addendum)
Well-controlled diabetes with A1c of 5.6 from 5 months ago.  Given patient's history of T2DM, HF and obesity he will most likely benefit from starting GLP-1.  Rx 2.5 mg of Zepbound weekly with plans to titrate up to 5mg  in 2 weeks if tolerated.  Risk and benefit discussed with patient who verbalized understanding and agreeable to plan.  Encouraged patient to follow-up with his ophthalmologist for his annual diabetic eye exam.

## 2023-03-11 NOTE — Patient Instructions (Addendum)
Great to see you today.  I started you on a medication that should help with your diabetes and weight loss.  Start off with Zepbound 2.5 mg weekly injection in your abdomen and if tolerated well we could go up to 5 mg weekly.  You can get the Shingrix vaccine at the pharmacy. You will need a 2nd shot 2-6 months after the first. This vaccine is important to help prevent shingles.  For the cyst in your left foot  it is okay to go get a steroid injection and improved with your foot doctor.  Given that your blood sugars are mostly well-controlled.  Schedule your colonoscopy to help detect colon cancer. The stomach doctors will call to schedule an appt with you. If you decide against this, please ask about the cologuard as an option.     Make sure to see your eye doctor to complete your annual diabetic eye exam.  Follow-up in 3 months.

## 2023-03-12 ENCOUNTER — Emergency Department (HOSPITAL_COMMUNITY): Payer: Medicaid Other

## 2023-03-12 ENCOUNTER — Inpatient Hospital Stay (HOSPITAL_COMMUNITY)
Admission: EM | Admit: 2023-03-12 | Discharge: 2023-03-16 | DRG: 554 | Disposition: A | Discharge status: Home / Self Care | Payer: Medicaid Other | Attending: Family Medicine | Admitting: Family Medicine

## 2023-03-12 ENCOUNTER — Encounter (HOSPITAL_COMMUNITY): Payer: Self-pay

## 2023-03-12 ENCOUNTER — Other Ambulatory Visit: Payer: Self-pay

## 2023-03-12 DIAGNOSIS — Z79899 Other long term (current) drug therapy: Secondary | ICD-10-CM

## 2023-03-12 DIAGNOSIS — M109 Gout, unspecified: Secondary | ICD-10-CM | POA: Diagnosis not present

## 2023-03-12 DIAGNOSIS — I43 Cardiomyopathy in diseases classified elsewhere: Secondary | ICD-10-CM | POA: Diagnosis present

## 2023-03-12 DIAGNOSIS — M7989 Other specified soft tissue disorders: Secondary | ICD-10-CM | POA: Diagnosis present

## 2023-03-12 DIAGNOSIS — L03119 Cellulitis of unspecified part of limb: Secondary | ICD-10-CM | POA: Diagnosis not present

## 2023-03-12 DIAGNOSIS — M10272 Drug-induced gout, left ankle and foot: Secondary | ICD-10-CM | POA: Diagnosis present

## 2023-03-12 DIAGNOSIS — Z86711 Personal history of pulmonary embolism: Secondary | ICD-10-CM

## 2023-03-12 DIAGNOSIS — Z794 Long term (current) use of insulin: Secondary | ICD-10-CM

## 2023-03-12 DIAGNOSIS — M79671 Pain in right foot: Secondary | ICD-10-CM | POA: Diagnosis not present

## 2023-03-12 DIAGNOSIS — Z7901 Long term (current) use of anticoagulants: Secondary | ICD-10-CM | POA: Diagnosis not present

## 2023-03-12 DIAGNOSIS — Z720 Tobacco use: Secondary | ICD-10-CM | POA: Diagnosis present

## 2023-03-12 DIAGNOSIS — Z8249 Family history of ischemic heart disease and other diseases of the circulatory system: Secondary | ICD-10-CM

## 2023-03-12 DIAGNOSIS — K219 Gastro-esophageal reflux disease without esophagitis: Secondary | ICD-10-CM | POA: Diagnosis present

## 2023-03-12 DIAGNOSIS — E1122 Type 2 diabetes mellitus with diabetic chronic kidney disease: Secondary | ICD-10-CM | POA: Diagnosis present

## 2023-03-12 DIAGNOSIS — F17210 Nicotine dependence, cigarettes, uncomplicated: Secondary | ICD-10-CM | POA: Diagnosis present

## 2023-03-12 DIAGNOSIS — Z833 Family history of diabetes mellitus: Secondary | ICD-10-CM

## 2023-03-12 DIAGNOSIS — T501X5A Adverse effect of loop [high-ceiling] diuretics, initial encounter: Secondary | ICD-10-CM | POA: Diagnosis present

## 2023-03-12 DIAGNOSIS — J449 Chronic obstructive pulmonary disease, unspecified: Secondary | ICD-10-CM | POA: Diagnosis present

## 2023-03-12 DIAGNOSIS — I130 Hypertensive heart and chronic kidney disease with heart failure and stage 1 through stage 4 chronic kidney disease, or unspecified chronic kidney disease: Secondary | ICD-10-CM | POA: Diagnosis present

## 2023-03-12 DIAGNOSIS — E7800 Pure hypercholesterolemia, unspecified: Secondary | ICD-10-CM | POA: Diagnosis present

## 2023-03-12 DIAGNOSIS — G629 Polyneuropathy, unspecified: Secondary | ICD-10-CM

## 2023-03-12 DIAGNOSIS — I509 Heart failure, unspecified: Secondary | ICD-10-CM

## 2023-03-12 DIAGNOSIS — Z6841 Body Mass Index (BMI) 40.0 and over, adult: Secondary | ICD-10-CM

## 2023-03-12 DIAGNOSIS — E854 Organ-limited amyloidosis: Secondary | ICD-10-CM | POA: Diagnosis present

## 2023-03-12 DIAGNOSIS — N1831 Chronic kidney disease, stage 3a: Secondary | ICD-10-CM | POA: Diagnosis present

## 2023-03-12 DIAGNOSIS — L02612 Cutaneous abscess of left foot: Secondary | ICD-10-CM | POA: Diagnosis present

## 2023-03-12 DIAGNOSIS — M79672 Pain in left foot: Secondary | ICD-10-CM | POA: Diagnosis not present

## 2023-03-12 DIAGNOSIS — Z7902 Long term (current) use of antithrombotics/antiplatelets: Secondary | ICD-10-CM

## 2023-03-12 DIAGNOSIS — I10 Essential (primary) hypertension: Secondary | ICD-10-CM | POA: Diagnosis not present

## 2023-03-12 DIAGNOSIS — E119 Type 2 diabetes mellitus without complications: Secondary | ICD-10-CM

## 2023-03-12 DIAGNOSIS — I5030 Unspecified diastolic (congestive) heart failure: Secondary | ICD-10-CM

## 2023-03-12 DIAGNOSIS — E1140 Type 2 diabetes mellitus with diabetic neuropathy, unspecified: Secondary | ICD-10-CM | POA: Diagnosis present

## 2023-03-12 DIAGNOSIS — I2510 Atherosclerotic heart disease of native coronary artery without angina pectoris: Secondary | ICD-10-CM | POA: Diagnosis present

## 2023-03-12 DIAGNOSIS — Z7985 Long-term (current) use of injectable non-insulin antidiabetic drugs: Secondary | ICD-10-CM | POA: Diagnosis not present

## 2023-03-12 DIAGNOSIS — G4733 Obstructive sleep apnea (adult) (pediatric): Secondary | ICD-10-CM

## 2023-03-12 DIAGNOSIS — Z823 Family history of stroke: Secondary | ICD-10-CM

## 2023-03-12 DIAGNOSIS — L03116 Cellulitis of left lower limb: Secondary | ICD-10-CM | POA: Diagnosis not present

## 2023-03-12 DIAGNOSIS — K5900 Constipation, unspecified: Secondary | ICD-10-CM | POA: Diagnosis present

## 2023-03-12 DIAGNOSIS — M79673 Pain in unspecified foot: Secondary | ICD-10-CM | POA: Diagnosis not present

## 2023-03-12 DIAGNOSIS — L02419 Cutaneous abscess of limb, unspecified: Secondary | ICD-10-CM | POA: Diagnosis not present

## 2023-03-12 DIAGNOSIS — I5032 Chronic diastolic (congestive) heart failure: Secondary | ICD-10-CM | POA: Diagnosis present

## 2023-03-12 DIAGNOSIS — M19012 Primary osteoarthritis, left shoulder: Secondary | ICD-10-CM | POA: Diagnosis present

## 2023-03-12 DIAGNOSIS — E785 Hyperlipidemia, unspecified: Secondary | ICD-10-CM | POA: Diagnosis present

## 2023-03-12 LAB — CBC WITH DIFFERENTIAL/PLATELET
Abs Immature Granulocytes: 0.05 10*3/uL (ref 0.00–0.07)
Basophils Absolute: 0 10*3/uL (ref 0.0–0.1)
Basophils Relative: 0 %
Eosinophils Absolute: 0 10*3/uL (ref 0.0–0.5)
Eosinophils Relative: 0 %
HCT: 44.1 % (ref 39.0–52.0)
Hemoglobin: 14.8 g/dL (ref 13.0–17.0)
Immature Granulocytes: 0 %
Lymphocytes Relative: 11 %
Lymphs Abs: 1.4 10*3/uL (ref 0.7–4.0)
MCH: 30.3 pg (ref 26.0–34.0)
MCHC: 33.6 g/dL (ref 30.0–36.0)
MCV: 90.2 fL (ref 80.0–100.0)
Monocytes Absolute: 1.1 10*3/uL — ABNORMAL HIGH (ref 0.1–1.0)
Monocytes Relative: 9 %
Neutro Abs: 10.5 10*3/uL — ABNORMAL HIGH (ref 1.7–7.7)
Neutrophils Relative %: 80 %
Platelets: 244 10*3/uL (ref 150–400)
RBC: 4.89 MIL/uL (ref 4.22–5.81)
RDW: 15.3 % (ref 11.5–15.5)
WBC: 13.2 10*3/uL — ABNORMAL HIGH (ref 4.0–10.5)
nRBC: 0 % (ref 0.0–0.2)

## 2023-03-12 LAB — COMPREHENSIVE METABOLIC PANEL
ALT: 19 U/L (ref 0–44)
AST: 22 U/L (ref 15–41)
Albumin: 4 g/dL (ref 3.5–5.0)
Alkaline Phosphatase: 63 U/L (ref 38–126)
Anion gap: 10 (ref 5–15)
BUN: 18 mg/dL (ref 8–23)
CO2: 26 mmol/L (ref 22–32)
Calcium: 9.1 mg/dL (ref 8.9–10.3)
Chloride: 97 mmol/L — ABNORMAL LOW (ref 98–111)
Creatinine, Ser: 1.37 mg/dL — ABNORMAL HIGH (ref 0.61–1.24)
GFR, Estimated: 58 mL/min — ABNORMAL LOW (ref >60)
Glucose, Bld: 105 mg/dL — ABNORMAL HIGH (ref 70–99)
Potassium: 4.2 mmol/L (ref 3.5–5.1)
Sodium: 133 mmol/L — ABNORMAL LOW (ref 135–145)
Total Bilirubin: 0.7 mg/dL (ref 0.0–1.2)
Total Protein: 7.8 g/dL (ref 6.5–8.1)

## 2023-03-12 LAB — PROTIME-INR
INR: 1.2 (ref 0.8–1.2)
Prothrombin Time: 15.6 s — ABNORMAL HIGH (ref 11.4–15.2)

## 2023-03-12 LAB — APTT: aPTT: 41 s — ABNORMAL HIGH (ref 24–36)

## 2023-03-12 LAB — I-STAT CG4 LACTIC ACID, ED
Lactic Acid, Venous: 1.7 mmol/L (ref 0.5–1.9)
Lactic Acid, Venous: 1.2 mmol/L (ref 0.5–1.9)

## 2023-03-12 MED ORDER — VANCOMYCIN HCL 1.5 G IV SOLR
1500.0000 mg | INTRAVENOUS | Status: DC
  Filled 2023-03-12: qty 30

## 2023-03-12 MED ORDER — SODIUM CHLORIDE 0.9 % IV SOLN
2.0000 g | Freq: Three times a day (TID) | INTRAVENOUS | Status: DC
  Administered 2023-03-12 – 2023-03-13 (×3): 2 g via INTRAVENOUS
  Filled 2023-03-12 (×3): qty 12.5

## 2023-03-12 MED ORDER — VANCOMYCIN HCL 10 G IV SOLR
2500.0000 mg | Freq: Once | INTRAVENOUS | Status: AC
  Administered 2023-03-12: 2500 mg via INTRAVENOUS
  Filled 2023-03-12: qty 25

## 2023-03-12 MED ORDER — VANCOMYCIN HCL 10 G IV SOLR
2500.0000 mg | Freq: Once | INTRAVENOUS | Status: DC
  Filled 2023-03-12: qty 25

## 2023-03-12 MED ORDER — VANCOMYCIN HCL 1750 MG/350ML IV SOLN: 1750.0000 mg | INTRAVENOUS | Status: DC

## 2023-03-12 MED ORDER — HYDROMORPHONE HCL 1 MG/ML IJ SOLN
0.5000 mg | Freq: Once | INTRAMUSCULAR | Status: AC
  Administered 2023-03-12: 0.5 mg via INTRAVENOUS
  Filled 2023-03-12: qty 1

## 2023-03-12 MED ORDER — ACETAMINOPHEN 500 MG PO TABS
1000.0000 mg | ORAL_TABLET | Freq: Once | ORAL | Status: AC
  Administered 2023-03-12: 1000 mg via ORAL
  Filled 2023-03-12: qty 2

## 2023-03-12 NOTE — ED Provider Triage Note (Signed)
Emergency Medicine Provider Triage Evaluation Note  Russell Collier , a 63 y.o. male  was evaluated in triage.  Pt complains of left foot pain and swelling that started yesterday afternoon while at PCP office. He reports that PCP recommended him to schedule appointment with podiatry.  Today, he reports worsening pain, swelling, warmth, chills so sought ED eval via EMS.  He received 100 mcg of fentanyl and route improving pain.  He was seen by podiatry on 03/10/23 and his PCP yesterday. Podiatry recommended to f/u in 3 mo  Review of Systems  Positive: Diffuse left foot pain, swelling, warmth, chills Negative:   Physical Exam  BP 125/74 (BP Location: Right Arm)   Pulse 91   Temp (!) 101.4 F (38.6 C) (Oral)   Resp 20   Ht 5\' 10"  (1.778 m)   Wt 127.5 kg   SpO2 96%   BMI 40.32 kg/m  Gen:   Awake, no distress   Resp:  Normal effort  MSK:   Moves extremities without difficulty.  Warmth, swelling to dorsal aspect of left foot, exquisitely tender with light palpation of left foot Other:    Medical Decision Making  Medically screening exam initiated at 5:14 PM.  Appropriate orders placed.  Aidrian Cuffe was informed that the remainder of the evaluation will be completed by another provider, this initial triage assessment does not replace that evaluation, and the importance of remaining in the ED until their evaluation is complete.  Labs, Tylenol, and x-ray ordered   Judithann Sheen, [place] 03/12/23 1719
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
## 2023-03-12 NOTE — ED Provider Notes (Signed)
I provided a substantive portion of the care of this patient.  I personally made/approved the management plan for this patient and take responsibility for the patient management. {Remember to document shared critical care using "edcritical" dot phrase:1} EKG Interpretation Date/Time:  Saturday March 12 2023 17:59:31 EST Ventricular Rate:  69 PR Interval:  206 QRS Duration:  132 QT Interval:  394 QTC Calculation: 422 R Axis:   -55  Text Interpretation: Normal sinus rhythm Left axis deviation Left ventricular hypertrophy with QRS widening ( R in aVL , Cornell product ) Cannot rule out Septal infarct , age undetermined Abnormal ECG When compared with ECG of 12-Oct-2022 11:23, improved ischemic changes from prior 8/24 Confirmed by Meridee Score [phone]) on 03/12/2023 6:16:37 PM

## 2023-03-12 NOTE — ED Provider Notes (Signed)
[name] Provider Note   CSN: [id number] Arrival date & time: 03/12/23  1658     History  Chief Complaint  Patient presents with   Foot Pain    Russell Collier is a 63 y.o. male with past medical history of HTN, neuropathy, heart failure, HLD, CAD, BMI 40, OSA, type 2 diabetes, sepsis, cellulitis presents emergency department for evaluation of left foot pain, warmth, swelling that he noticed yesterday afternoon following PCP visit.  PCP recommended the patient follow-up with podiatry regarding further management.  Today, he reports difficulty with ambulation secondary to pain.  Initially, in triage patient is febrile at 101.4 F, no tachycardia.   Foot Pain Pertinent negatives include no chest pain, no abdominal pain, no headaches and no shortness of breath.     Home Medications Prior to Admission medications   Medication Sig Start Date End Date Taking? Authorizing Provider  acetaminophen (TYLENOL) 325 MG tablet Take 2 tablets (650 mg total) by mouth every 6 (six) hours as needed for mild pain or moderate pain. 07/13/21   Dameron, Nolberto Hanlon, DO  albuterol (PROVENTIL) (2.5 MG/3ML) 0.083% nebulizer solution Take 3 mLs (2.5 mg total) by nebulization every 6 (six) hours as needed for wheezing or shortness of breath. 11/20/20   Cresenzo, Cyndi Lennert, MD  apixaban (ELIQUIS) 2.5 MG TABS tablet Take 1 tablet (2.5 mg total) by mouth 2 (two) times daily. 01/17/23   [name], Bevelyn Buckles, MD  betamethasone valerate ointment (VALISONE) 0.1 % Apply 1 Application topically 2 (two) times daily. 02/28/23   Shelby Mattocks, DO  Blood Glucose Monitoring Suppl DEVI 1 each by Does not apply route in the morning, at noon, and at bedtime. May substitute to any manufacturer covered by patient's insurance. 08/10/22   Valetta Close, MD  cephALEXin (KEFLEX) 500 MG capsule Take 1 capsule (500 mg total) by mouth 3 (three) times daily. 08/02/22   Al Decant, PA-C   clopidogrel (PLAVIX) 75 MG tablet Take 1 tablet (75 mg total) by mouth daily. 02/28/23 02/28/24  Shelby Mattocks, DO  colchicine 0.6 MG tablet Take 2 tablets (1.2 mg) and 1 hour later take a 0.6 mg and after the after that take 0.6 mg daily for 2 days. 02/28/23   Shelby Mattocks, DO  dapagliflozin propanediol (FARXIGA) 10 MG TABS tablet Take 1 tablet (10 mg total) by mouth daily before breakfast. 01/17/23   [name], Bevelyn Buckles, MD  enalapril (VASOTEC) 10 MG tablet Take 1 tablet (10 mg total) by mouth at bedtime. 01/17/23   [name], Bevelyn Buckles, MD  fluticasone (FLONASE) 50 MCG/ACT nasal spray Place 1 spray into both nostrils daily. 02/28/23   Shelby Mattocks, DO  furosemide (LASIX) 40 MG tablet Take 1 tablet (40 mg total) by mouth 2 (two) times daily. 01/17/23   [name], Bevelyn Buckles, MD  ibuprofen (ADVIL) 200 MG tablet Take 600 mg by mouth every 6 (six) hours as needed for headache or moderate pain.    [provider]  lidocaine (LIDODERM) 5 % Place 1 patch onto the skin daily. Remove & Discard patch within 12 hours or as directed by MD 12/01/21   Nicanor Alcon, April, MD  multivitamin (ONE-A-DAY MEN'S) TABS tablet Take 1 tablet by mouth daily with breakfast.    [provider]  oxyCODONE (ROXICODONE) 5 MG immediate release tablet Take 1 tablet (5 mg total) by mouth every 8 (eight) hours as needed for up to 12 doses for severe pain (pain score 7-10). 12/21/22  Terald Sleeper, MD  pantoprazole (PROTONIX) 40 MG tablet Take 1 tablet (40 mg total) by mouth daily. 02/28/23   Shelby Mattocks, DO  potassium chloride SA (KLOR-CON M) 20 MEQ tablet Take 2 tablets (40 mEq total) by mouth daily. 01/17/23   [name], Bevelyn Buckles, MD  pregabalin (LYRICA) 100 MG capsule Take 1 capsule (100 mg total) by mouth 3 (three) times daily. 02/28/23   Shelby Mattocks, DO  rosuvastatin (CRESTOR) 10 MG tablet Take 1 tablet (10 mg total) by mouth daily. 02/24/23   Dameron, Nolberto Hanlon, DO  rosuvastatin (CRESTOR) 10 MG tablet Take 1 tablet  (10 mg total) by mouth daily. 02/28/23   Shelby Mattocks, DO  Skin Protectants, Misc. (MINERIN CREME) CREA Apply on affected area twice daily 02/28/23   Shelby Mattocks, DO  Tafamidis (VYNDAMAX) 61 MG CAPS TAKE 1 CAPSULE BY MOUTH DAILY. 10/29/22 10/29/23  [name], Bevelyn Buckles, MD  tirzepatide [facility]) 2.5 MG/0.5ML injection vial Inject 2.5 mg into the skin once a week. 03/11/23   Jerre Simon, MD  triamcinolone ointment (KENALOG) 0.5 % Apply 1 Application topically 2 (two) times daily. 02/28/23   Shelby Mattocks, DO  umeclidinium-vilanterol (ANORO ELLIPTA) 62.5-25 MCG/ACT AEPB Inhale 1 puff into the lungs daily. 02/28/23   Shelby Mattocks, DO  valACYclovir (VALTREX) 500 MG tablet Take 1 tablet (500 mg total) by mouth daily. 02/24/23   Dameron, Nolberto Hanlon, DO  VENTOLIN HFA 108 (90 Base) MCG/ACT inhaler Inhale 2 puffs into the lungs every 6 (six) hours as needed for wheezing or shortness of breath. 02/28/23   Shelby Mattocks, DO  Vitamin A 2400 MCG (8000 UT) TABS Take 1 tablet by mouth daily. 05/21/21   [name], Bevelyn Buckles, MD  vutrisiran sodium (AMVUTTRA) 25 MG/0.5ML syringe Inject 0.5 mLs (25 mg total) into the skin every 3 (three) months. 05/01/21   [name], Bevelyn Buckles, MD      Allergies    Chantix [varenicline], Bupropion, Bactrim [sulfamethoxazole-trimethoprim], and Nicoderm [nicotine]    Review of Systems   Review of Systems  Constitutional:  Negative for chills, fatigue and fever.  Respiratory:  Negative for cough, chest tightness, shortness of breath and wheezing.   Cardiovascular:  Negative for chest pain and palpitations.  Gastrointestinal:  Negative for abdominal pain, constipation, diarrhea, nausea and vomiting.  Musculoskeletal:  Positive for myalgias.  Neurological:  Negative for dizziness, seizures, weakness, light-headedness, numbness and headaches.    Physical Exam Updated Vital Signs BP 110/77   Pulse 95   Temp 99.5 F (37.5 C) (Oral)   Resp 18   Ht 5\' 10"  (1.778 m)   Wt 127.5 kg   SpO2 100%    BMI 40.32 kg/m  Physical Exam Vitals and nursing note reviewed.  Constitutional:      General: He is not in acute distress.    Appearance: Normal appearance.  HENT:     Head: Normocephalic and atraumatic.  Eyes:     Conjunctiva/sclera: Conjunctivae normal.  Cardiovascular:     Rate and Rhythm: Normal rate.  Pulmonary:     Effort: Pulmonary effort is normal. No respiratory distress.  Musculoskeletal:     Cervical back: Normal range of motion and neck supple. No rigidity or tenderness.     Comments: There is swelling to dorsal aspect of left foot with warmth.  He is diffusely tender to entire foot but localized to dorsal aspect of foot.  No breaks in skin integrity or noted as precipitating infection source.  Dorsalis pedis 1+ due to swelling.  He is  able to flex and extend digits of left foot There is no calf tenderness, nor swelling to extremity uniformly to suggest systemic fluid retention nor DVT  Skin:    Capillary Refill: Capillary refill takes less than 2 seconds.     Coloration: Skin is not jaundiced or pale.  Neurological:     Mental Status: He is alert and oriented to person, place, and time. Mental status is at baseline.     Comments: Full lower extremity neurological exam cannot be performed secondary to pain but he has pain with small dorsi and plantarflexion of left ankle    ED Results / Procedures / Treatments   Labs (all labs ordered are listed, but only abnormal results are displayed) Labs Reviewed  COMPREHENSIVE METABOLIC PANEL - Abnormal; Notable for the following components:      Result Value   Sodium 133 (*)    Chloride 97 (*)    Glucose, Bld 105 (*)    Creatinine, Ser 1.37 (*)    GFR, Estimated 58 (*)    All other components within normal limits  CBC WITH DIFFERENTIAL/PLATELET - Abnormal; Notable for the following components:   WBC 13.2 (*)    Neutro Abs 10.5 (*)    Monocytes Absolute 1.1 (*)    All other components within normal limits  PROTIME-INR -  Abnormal; Notable for the following components:   Prothrombin Time 15.6 (*)    All other components within normal limits  APTT - Abnormal; Notable for the following components:   aPTT 41 (*)    All other components within normal limits  CULTURE, BLOOD (ROUTINE X 2)  CULTURE, BLOOD (ROUTINE X 2)  I-STAT CG4 LACTIC ACID, ED  I-STAT CG4 LACTIC ACID, ED    EKG EKG Interpretation Date/Time:  Saturday March 12 2023 17:59:31 EST Ventricular Rate:  69 PR Interval:  206 QRS Duration:  132 QT Interval:  394 QTC Calculation: 422 R Axis:   -55  Text Interpretation: Normal sinus rhythm Left axis deviation Left ventricular hypertrophy with QRS widening ( R in aVL , Cornell product ) Cannot rule out Septal infarct , age undetermined Abnormal ECG When compared with ECG of 12-Oct-2022 11:23, improved ischemic changes from prior 8/24 Confirmed by Meridee Score [phone]) on 03/12/2023 6:16:37 PM  Radiology DG Foot Complete Left Result Date: 03/12/2023 CLINICAL DATA:  Cellulitis EXAM: LEFT FOOT - COMPLETE 3+ VIEW COMPARISON:  03/18/2022 FINDINGS: Frontal, oblique, and lateral views of the left foot are obtained. No acute fracture, subluxation, or dislocation. Mild midfoot osteoarthritis. There is diffuse soft tissue swelling. No evidence of bony destruction or periosteal reaction to suggest osteomyelitis. IMPRESSION: 1. Diffuse soft tissue swelling consistent with given history of cellulitis. 2. No acute or destructive bony abnormalities. No evidence of osteomyelitis. 3. Stable midfoot osteoarthritis. Electronically Signed   By: Sharlet Salina M.D.   On: 03/12/2023 18:36    Procedures Procedures    Medications Ordered in ED Medications  ceFEPIme (MAXIPIME) 2 g in sodium chloride 0.9 % 100 mL IVPB (has no administration in time range)  vancomycin (VANCOCIN) 2,500 mg in sodium chloride 0.9 % 500 mL IVPB (has no administration in time range)    Followed by  Vancomycin (VANCOCIN) 1,500 mg in sodium  chloride 0.9 % 500 mL IVPB (has no administration in time range)  acetaminophen (TYLENOL) tablet 1,000 mg (1,000 mg Oral Given 03/12/23 1936)  HYDROmorphone (DILAUDID) injection 0.5 mg (0.5 mg Intravenous Given 03/12/23 1944)    ED Course/ Medical Decision Making/  A&P                                 Medical Decision Making Amount and/or Complexity of Data Reviewed Labs: ordered. Radiology: ordered.  Risk OTC drugs.   Patient presents to the ED for concern of left foot pain, swelling, warmth that started yesterday, this involves an extensive number of treatment options, and is a complaint that carries with it a high risk of complications and morbidity.  The differential diagnosis includes cellulitis, DVT, fluid overload   Co morbidities that complicate the patient evaluation  See HPI   Additional history obtained:  Additional history obtained from Nursing and Outside Medical Records   External records from outside source obtained and reviewed including  Triage RN note Recent medical evaluation and medication list   Lab Tests:  I Ordered, and personally interpreted labs.  The pertinent results include:   Leukocytosis of 13.2  Creatinine 1.37 (baseline is 1.18-1.68 over past year) Lactic acid WNL   Imaging Studies ordered:  I ordered imaging studies including left foot x-ray I independently visualized and interpreted imaging which showed soft tissue swelling without osteomyelitis I agree with the radiologist interpretation   Cardiac Monitoring:  The patient was maintained on a cardiac monitor.  I personally viewed and interpreted the cardiac monitored which showed an underlying rhythm of: NSR with no STE nor ischemic changes.  No changes from prior EKG   Medicines ordered and prescription drug management:  I ordered medication including Tylenol for fever and pain Reevaluation of the patient after these medicines showed that the patient improved I have reviewed  the patients home medicines and have made adjustments as needed  Consults obtained  I requested consultation with hospitalist and discussed patient presentation, ED workup.  Dr. Vedia Pereyra accepts patient for admission   Problem List / ED Course:  Cellulitis Left foot is warm and mildly swollen He does have a mild leukocytosis of 13. Lactic acid WNL. Blood cultures pending X-ray negative for osteomyelitis Will provide Tylenol and analgesia in ED Will admit for IV abx   Reevaluation:  After the interventions noted above, I reevaluated the patient and found that they have :improved   Social Determinants of Health:  Has PCP and podiatry follow-up   Dispostion:  After consideration of the diagnostic results and the patients response to treatment, I feel that the patent would benefit from admission for IV abx  Dr. Donnald Garre individually assessed patient, reviewed ED workup and agrees with plan Final Clinical Impression(s) / ED Diagnoses Final diagnoses:  Cellulitis of left lower extremity    Rx / DC Orders ED Discharge Orders     None         Judithann Sheen, PA 03/12/23 2036    Arby Barrette, MD 03/16/23 1354

## 2023-03-12 NOTE — Progress Notes (Addendum)
Pharmacy Antibiotic Note  Russell Collier is a 63 y.o. male for which pharmacy has been consulted for cefepime and vancomycin dosing for  wound infection .  Patient with a history of HTN, neuropathy, heart failure, HLD, CAD, BMI 40, OSA, type 2 diabetes, sepsis, cellulitis . Patient presenting with foot pain, warmth, and swelling.  SCr 1.37 - baseline WBC 13.2; LA 1.7; T 99.5; HR 95; RR 18  Plan: Cefepime 2g q8hr  Vancomycin 2500 mg once then 1500 mg q24hr (eAUC 460.6; VD 0.5) unless change in renal function Monitor WBC, fever, renal function, cultures De-escalate when able Levels at steady state  Height: 5\' 10"  (177.8 cm) Weight: 127.5 kg (281 lb) IBW/kg (Calculated) : 73  Temp (24hrs), Avg:100.3 F (37.9 C), Min:99.5 F (37.5 C), Max:101.4 F (38.6 C)  Recent Labs  Lab 03/12/23 1744 03/12/23 1755  WBC 13.2*  --   CREATININE 1.37*  --   LATICACIDVEN  --  1.7    Estimated Creatinine Clearance: 73 mL/min (A) (by C-G formula based on SCr of 1.37 mg/dL (H)).    Allergies  Allergen Reactions   Chantix [Varenicline] Anaphylaxis, Swelling and Other (See Comments)    Patient reports laryngospasm stopped in the ER, throat swelling   Bupropion Other (See Comments)    Headache - moderate/severe - self discontinued agent    Bactrim [Sulfamethoxazole-Trimethoprim] Rash    Rash and hives   Nicoderm [Nicotine] Rash    Patch   Microbiology results: Pending  Thank you for allowing pharmacy to be a part of this patient's care.  [name] Landau, PharmD, BCPS 03/12/2023 8:06 PM ED Clinical Pharmacist -  [phone]

## 2023-03-12 NOTE — ED Triage Notes (Signed)
PER EMS: pt is from home with c/o left foot pain, unable to bear weight and appears swollen and tender to touch. Hx of cellulitis multiple times. He saw his Podiatrist on Thursday.   BP- 138/62, HR-90, RR-20, 98% RA. 20g left hand, received 100 mcg Fentanyl en route

## 2023-03-13 ENCOUNTER — Inpatient Hospital Stay (HOSPITAL_COMMUNITY): Payer: Medicaid Other

## 2023-03-13 DIAGNOSIS — M79672 Pain in left foot: Secondary | ICD-10-CM | POA: Diagnosis not present

## 2023-03-13 DIAGNOSIS — M79673 Pain in unspecified foot: Secondary | ICD-10-CM | POA: Diagnosis not present

## 2023-03-13 DIAGNOSIS — M79671 Pain in right foot: Secondary | ICD-10-CM

## 2023-03-13 LAB — CBC
HCT: 39.1 % (ref 39.0–52.0)
Hemoglobin: 13.1 g/dL (ref 13.0–17.0)
MCH: 30 pg (ref 26.0–34.0)
MCHC: 33.5 g/dL (ref 30.0–36.0)
MCV: 89.5 fL (ref 80.0–100.0)
Platelets: 211 10*3/uL (ref 150–400)
RBC: 4.37 MIL/uL (ref 4.22–5.81)
RDW: 15.3 % (ref 11.5–15.5)
WBC: 9.2 10*3/uL (ref 4.0–10.5)
nRBC: 0 % (ref 0.0–0.2)

## 2023-03-13 LAB — COMPREHENSIVE METABOLIC PANEL
ALT: 17 U/L (ref 0–44)
AST: 18 U/L (ref 15–41)
Albumin: 3.4 g/dL — ABNORMAL LOW (ref 3.5–5.0)
Alkaline Phosphatase: 61 U/L (ref 38–126)
Anion gap: 10 (ref 5–15)
BUN: 22 mg/dL (ref 8–23)
CO2: 24 mmol/L (ref 22–32)
Calcium: 8.6 mg/dL — ABNORMAL LOW (ref 8.9–10.3)
Chloride: 99 mmol/L (ref 98–111)
Creatinine, Ser: 1.4 mg/dL — ABNORMAL HIGH (ref 0.61–1.24)
GFR, Estimated: 56 mL/min — ABNORMAL LOW (ref >60)
Glucose, Bld: 127 mg/dL — ABNORMAL HIGH (ref 70–99)
Potassium: 3.5 mmol/L (ref 3.5–5.1)
Sodium: 133 mmol/L — ABNORMAL LOW (ref 135–145)
Total Bilirubin: 0.8 mg/dL (ref 0.0–1.2)
Total Protein: 6.8 g/dL (ref 6.5–8.1)

## 2023-03-13 LAB — GLUCOSE, CAPILLARY
Glucose-Capillary: 137 mg/dL — ABNORMAL HIGH (ref 70–99)
Glucose-Capillary: 119 mg/dL — ABNORMAL HIGH (ref 70–99)
Glucose-Capillary: 97 mg/dL (ref 70–99)
Glucose-Capillary: 104 mg/dL — ABNORMAL HIGH (ref 70–99)

## 2023-03-13 LAB — HIV ANTIBODY (ROUTINE TESTING W REFLEX): HIV Screen 4th Generation wRfx: NONREACTIVE

## 2023-03-13 LAB — CBG MONITORING, ED: Glucose-Capillary: 128 mg/dL — ABNORMAL HIGH (ref 70–99)

## 2023-03-13 LAB — C-REACTIVE PROTEIN: CRP: 10.2 mg/dL — ABNORMAL HIGH (ref <1.0)

## 2023-03-13 LAB — URIC ACID: Uric Acid, Serum: 8 mg/dL (ref 3.7–8.6)

## 2023-03-13 LAB — HEMOGLOBIN A1C
Hgb A1c MFr Bld: 6 % — ABNORMAL HIGH (ref 4.8–5.6)
Mean Plasma Glucose: 125.5 mg/dL

## 2023-03-13 LAB — SEDIMENTATION RATE: Sed Rate: 32 mm/h — ABNORMAL HIGH (ref 0–16)

## 2023-03-13 LAB — PROCALCITONIN: Procalcitonin: <0.1 ng/mL

## 2023-03-13 MED ORDER — PREGABALIN 75 MG PO CAPS
100.0000 mg | ORAL_CAPSULE | Freq: Three times a day (TID) | ORAL | Status: DC
  Administered 2023-03-13 – 2023-03-16 (×10): 100 mg via ORAL
  Filled 2023-03-13 (×10): qty 1

## 2023-03-13 MED ORDER — ROSUVASTATIN CALCIUM 5 MG PO TABS
10.0000 mg | ORAL_TABLET | Freq: Every day | ORAL | Status: DC
Start: 2023-03-13 — End: 2023-03-16
  Administered 2023-03-13 – 2023-03-16 (×4): 10 mg via ORAL
  Filled 2023-03-13 (×4): qty 2

## 2023-03-13 MED ORDER — APIXABAN 2.5 MG PO TABS
2.5000 mg | ORAL_TABLET | Freq: Two times a day (BID) | ORAL | Status: DC
  Administered 2023-03-13 – 2023-03-16 (×7): 2.5 mg via ORAL
  Filled 2023-03-13 (×7): qty 1

## 2023-03-13 MED ORDER — DAPAGLIFLOZIN PROPANEDIOL 10 MG PO TABS
10.0000 mg | ORAL_TABLET | Freq: Every day | ORAL | Status: DC
  Administered 2023-03-14 – 2023-03-16 (×3): 10 mg via ORAL
  Filled 2023-03-13 (×3): qty 1

## 2023-03-13 MED ORDER — ALBUTEROL SULFATE (2.5 MG/3ML) 0.083% IN NEBU: 2.5000 mg | INHALATION_SOLUTION | Freq: Four times a day (QID) | RESPIRATORY_TRACT | Status: DC | PRN

## 2023-03-13 MED ORDER — ONDANSETRON HCL 4 MG/2ML IJ SOLN: 4.0000 mg | Freq: Four times a day (QID) | INTRAMUSCULAR | Status: DC | PRN

## 2023-03-13 MED ORDER — UMECLIDINIUM-VILANTEROL 62.5-25 MCG/ACT IN AEPB
1.0000 | INHALATION_SPRAY | Freq: Every day | RESPIRATORY_TRACT | Status: DC
  Administered 2023-03-14 – 2023-03-16 (×3): 1 via RESPIRATORY_TRACT
  Filled 2023-03-13: qty 14

## 2023-03-13 MED ORDER — CLOPIDOGREL BISULFATE 75 MG PO TABS
75.0000 mg | ORAL_TABLET | Freq: Every day | ORAL | Status: DC
  Administered 2023-03-13 – 2023-03-16 (×4): 75 mg via ORAL
  Filled 2023-03-13 (×4): qty 1

## 2023-03-13 MED ORDER — VALACYCLOVIR HCL 500 MG PO TABS
500.0000 mg | ORAL_TABLET | Freq: Every day | ORAL | Status: DC
  Administered 2023-03-13 – 2023-03-16 (×4): 500 mg via ORAL
  Filled 2023-03-13 (×4): qty 1

## 2023-03-13 MED ORDER — INSULIN ASPART 100 UNIT/ML IJ SOLN: 0.0000 [IU] | Freq: Every day | INTRAMUSCULAR | Status: DC

## 2023-03-13 MED ORDER — INSULIN ASPART 100 UNIT/ML IJ SOLN
0.0000 [IU] | Freq: Three times a day (TID) | INTRAMUSCULAR | Status: DC
Start: 2023-03-13 — End: 2023-03-16
  Administered 2023-03-13 – 2023-03-15 (×4): 3 [IU] via SUBCUTANEOUS

## 2023-03-13 MED ORDER — VANCOMYCIN HCL 1500 MG/300ML IV SOLN
1500.0000 mg | INTRAVENOUS | Status: DC
Start: 2023-03-13 — End: 2023-03-13
  Filled 2023-03-13: qty 300

## 2023-03-13 MED ORDER — COLCHICINE 0.6 MG PO TABS
1.2000 mg | ORAL_TABLET | Freq: Every day | ORAL | Status: AC
  Administered 2023-03-13: 1.2 mg via ORAL
  Filled 2023-03-13: qty 2

## 2023-03-13 MED ORDER — SODIUM CHLORIDE 0.9 % IV SOLN: INTRAVENOUS | Status: AC

## 2023-03-13 MED ORDER — PANTOPRAZOLE SODIUM 40 MG PO TBEC
40.0000 mg | DELAYED_RELEASE_TABLET | Freq: Every day | ORAL | Status: DC
Start: 2023-03-13 — End: 2023-03-16
  Administered 2023-03-13 – 2023-03-16 (×4): 40 mg via ORAL
  Filled 2023-03-13 (×4): qty 1

## 2023-03-13 MED ORDER — MORPHINE SULFATE (PF) 2 MG/ML IV SOLN
2.0000 mg | INTRAVENOUS | Status: DC | PRN
  Administered 2023-03-13 (×3): 2 mg via INTRAVENOUS
  Filled 2023-03-13 (×3): qty 1

## 2023-03-13 MED ORDER — LIDOCAINE 5 % EX PTCH
2.0000 | MEDICATED_PATCH | CUTANEOUS | Status: DC
  Administered 2023-03-13 – 2023-03-16 (×4): 2 via TRANSDERMAL
  Filled 2023-03-13 (×4): qty 2

## 2023-03-13 MED ORDER — HYDROMORPHONE HCL 1 MG/ML IJ SOLN
0.5000 mg | INTRAMUSCULAR | Status: DC | PRN
  Administered 2023-03-13 – 2023-03-15 (×9): 0.5 mg via INTRAVENOUS
  Filled 2023-03-13 (×10): qty 0.5

## 2023-03-13 MED ORDER — HYDROMORPHONE HCL 1 MG/ML IJ SOLN
0.5000 mg | Freq: Once | INTRAMUSCULAR | Status: AC
Start: 2023-03-13 — End: 2023-03-13
  Administered 2023-03-13: 0.5 mg via INTRAVENOUS
  Filled 2023-03-13: qty 0.5

## 2023-03-13 MED ORDER — ACETAMINOPHEN 500 MG PO TABS
1000.0000 mg | ORAL_TABLET | Freq: Four times a day (QID) | ORAL | Status: DC
  Administered 2023-03-13 – 2023-03-16 (×11): 1000 mg via ORAL
  Filled 2023-03-13 (×13): qty 2

## 2023-03-13 MED ORDER — TAFAMIDIS 61 MG PO CAPS: 1.0000 | ORAL_CAPSULE | Freq: Every day | ORAL | Status: DC

## 2023-03-13 MED ORDER — COLCHICINE 0.6 MG PO TABS
0.6000 mg | ORAL_TABLET | Freq: Every day | ORAL | Status: AC
  Administered 2023-03-14: 0.6 mg via ORAL
  Filled 2023-03-13: qty 1

## 2023-03-13 MED ORDER — ONDANSETRON HCL 4 MG PO TABS: 4.0000 mg | ORAL_TABLET | Freq: Four times a day (QID) | ORAL | Status: DC | PRN

## 2023-03-13 MED ORDER — ACETAMINOPHEN 325 MG PO TABS
650.0000 mg | ORAL_TABLET | Freq: Four times a day (QID) | ORAL | Status: DC
  Administered 2023-03-13: 650 mg via ORAL
  Filled 2023-03-13: qty 2

## 2023-03-13 NOTE — Progress Notes (Signed)
   03/13/23 2200  BiPAP/CPAP/SIPAP  Reason BIPAP/CPAP not in use Non-compliant (pt does not use his machine at home and does not want one here)

## 2023-03-13 NOTE — Assessment & Plan Note (Addendum)
History of cardiac amyloidosis. Last Echo 50-55% G1DD. Follows closely with Dr. Gala Romney.  -States got Amvuttra injection Tuesday -Reorder Vyndamax 61 mg daily -Restart Farxiga 10 mg daily -Consider restarting home enalapril, lasix

## 2023-03-13 NOTE — Assessment & Plan Note (Addendum)
L Foot XR with diffuse soft tissue swelling and no evidence of osteomyelitis. States R foot hurts now more with TTP but no overlying erythema. Bcx NG <24h. -Vanc (1/18- ); Cefepime (1/18- )  -Consider R foot imaging -Consider uric acid level (hx of gout) -Tylenol scheduled -Morphine PRN, consider switch to PO oxy -Restart home lyrica 100 TID -AM CBC, CMP -Consider podiatry consult if not improving

## 2023-03-13 NOTE — Plan of Care (Signed)

## 2023-03-13 NOTE — Assessment & Plan Note (Addendum)
A1c 6.0. Home meds of Zepbound weekly (has not started), farxiga 10 mg daily. -Restart farxiga 10 mg daily -rSSI with bedtime coverage, consider downgrading this
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
## 2023-03-13 NOTE — Progress Notes (Addendum)
Daily Progress Note Intern Pager: [phone]  Patient name: Russell Collier Medical record number: [id number] Date of birth: 09-08-1960 Age: 63 y.o. Gender: male  Primary Care Provider: Jerre Simon, MD Consultants: None  Code Status: FULL  Pt Overview and Major Events to Date:  1/18 admitted  Assessment and Plan:  [name] pertinent PMH/PSH DM2, HTN, CHF, CAD, OSA, hx PE 63 y.o. male here with left foot cellulitis  Assessment & Plan Cellulitis and abscess of lower extremity L Foot XR with diffuse soft tissue swelling and no evidence of osteomyelitis. States R foot hurts now more with TTP but no overlying erythema. Bcx NG <24h. 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 Last Echo 50-55% G1DD. Follows closely with Dr. Gala Romney.  -States got Amvuttra injection Tuesday -Reorder Vyndamax 61 mg daily -Restart Farxiga 10 mg daily -Consider restarting home enalapril, lasix Type 2 diabetes mellitus without complication, without long-term current use of insulin (HCC) A1c 6.0. Home meds of Zepbound weekly (has not started), farxiga 10 mg daily. 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  States that he was nauseated prior to admission however never vomited.   Did have fever.  Objective: Temp:  [98.9 F (37.2 C)-101.4 F (38.6 C)] 98.9 F (37.2 C) (01/19 0530) Pulse Rate:  [69-96] 83 (01/19 0355) Resp:  [18-20] 18 (01/19 0329) BP: (110-134)/(50-88) 134/71 (01/19 0355) SpO2:  [95 %-100 %] 95 % (01/19 0355) Weight:  [127.5 kg] 127.5 kg (01/18 1706) Physical Exam: General: NAD, awake, alert, responsive to all questions Cardiovascular: RRR Respiratory: Intermittent wheezes throughout posterior lung fields, no increased work of breathing on room air Abdomen: Soft, nontender to palpation Extremities: No lower extremity edema, left foot with mild erythema medially daily mild warmth and tenderness to palpation.  Right foot with moderate tenderness to palpation of midfoot without overlying warmth or erythema    Laboratory: Most recent CBC Lab Results  Component Value Date   WBC 9.2 03/13/2023   HGB 13.1 03/13/2023   HCT 39.1 03/13/2023   MCV 89.5 03/13/2023   PLT 211 03/13/2023   Most recent BMP    Latest Ref Rng & Units 03/13/2023    1:54 AM  BMP  Glucose 70 - 99 mg/dL 536   BUN 8 - 23 mg/dL 22   Creatinine 6.44 - 1.24 mg/dL 0.34   Sodium 742 - 595 mmol/L 133   Potassium 3.5 - 5.1 mmol/L 3.5   Chloride 98 - 111 mmol/L 99   CO2 22 - 32 mmol/L 24   Calcium 8.9 - 10.3 mg/dL 8.6     Other pertinent labs HIV NR, A1c 6.0  DG Foot Complete Left Result Date: 03/12/2023 CLINICAL DATA:  Cellulitis EXAM: LEFT FOOT - COMPLETE 3+ VIEW COMPARISON:  03/18/2022 FINDINGS: Frontal, oblique, and lateral views of the left foot are obtained. No acute  fracture, subluxation, or dislocation. Mild midfoot osteoarthritis. There is diffuse soft tissue swelling. No evidence of bony destruction or periosteal reaction to suggest osteomyelitis. IMPRESSION: 1. Diffuse soft tissue swelling consistent with given history of cellulitis. 2. No acute or destructive bony abnormalities. No evidence of osteomyelitis. 3. Stable midfoot osteoarthritis. Electronically Signed    By: Sharlet Salina M.D.   On: 03/12/2023 18:36     Levin Erp, MD 03/13/2023, 7:01 AM  PGY-3, [facility] FPTS Intern pager: [phone], text pages welcome Secure chat group [facility] Teaching Service

## 2023-03-13 NOTE — Hospital Course (Addendum)
Russell Collier is a 63 y.o.male with a history of gout, T2DM, CAD, PE on eliquis, CHF, amyloidosis who was admitted to the [facility] Teaching Service at [facility] for left foot swelling and numbness. His hospital course is detailed below:  Gout Flare Patient arrived L Foot XR with diffuse soft tissue swelling and no evidence of osteomyelitis. Uric acid 8.0. Patient was initially started on antibiotics for concern for cellulitis, discontinued abx after 1 day. Initially held home Lasix due to gout flare, resumed by time of admission. Patient received colchicine and prednisone with improvement of pain symptoms. Started on daily allopurinol. By time of discharge, patient able to ambulate without difficulty  CHF  Familial Amyloidosis  Last Echo 50-55% G1DD in Aug 2024. Follows closely with Dr. Gala Romney. Initially held home Lasix due to gout flare, resumed by time of admission. Continued all other home meds during admission.   T2DM A1c 6.0. Home meds of Zepbound weekly (has not started), farxiga 10 mg daily. Marcelline Deist resumed during admission with rSSI. CBGs remained stable throughout admission.  HTN  Home medications of enalapril and lasix. Held enalapril during admission. Bps remained stable throughout admission. Recommend switching to losartan for BP control given possible lowering effect on uric acid.  Other chronic conditions were medically managed with home medications and formulary alternatives as necessary (OSA, HLD, Hx PE, PAD, HSV, GERD, COPD)  PCP Follow-up Recommendations: Follow up CPAP compliance Consider switching to losartan for BP control given possible lowering effect on uric acid Follow up on BMP

## 2023-03-14 DIAGNOSIS — M109 Gout, unspecified: Secondary | ICD-10-CM

## 2023-03-14 LAB — GLUCOSE, CAPILLARY
Glucose-Capillary: 120 mg/dL — ABNORMAL HIGH (ref 70–99)
Glucose-Capillary: 104 mg/dL — ABNORMAL HIGH (ref 70–99)
Glucose-Capillary: 131 mg/dL — ABNORMAL HIGH (ref 70–99)
Glucose-Capillary: 148 mg/dL — ABNORMAL HIGH (ref 70–99)

## 2023-03-14 LAB — CBC
HCT: 37.3 % — ABNORMAL LOW (ref 39.0–52.0)
Hemoglobin: 12.6 g/dL — ABNORMAL LOW (ref 13.0–17.0)
MCH: 30.2 pg (ref 26.0–34.0)
MCHC: 33.8 g/dL (ref 30.0–36.0)
MCV: 89.4 fL (ref 80.0–100.0)
Platelets: 203 10*3/uL (ref 150–400)
RBC: 4.17 MIL/uL — ABNORMAL LOW (ref 4.22–5.81)
RDW: 15.2 % (ref 11.5–15.5)
WBC: 8.4 10*3/uL (ref 4.0–10.5)
nRBC: 0 % (ref 0.0–0.2)

## 2023-03-14 LAB — BASIC METABOLIC PANEL
Anion gap: 7 (ref 5–15)
BUN: 19 mg/dL (ref 8–23)
CO2: 24 mmol/L (ref 22–32)
Calcium: 8.7 mg/dL — ABNORMAL LOW (ref 8.9–10.3)
Chloride: 103 mmol/L (ref 98–111)
Creatinine, Ser: 1.15 mg/dL (ref 0.61–1.24)
GFR, Estimated: >60 mL/min (ref >60)
Glucose, Bld: 96 mg/dL (ref 70–99)
Potassium: 3.9 mmol/L (ref 3.5–5.1)
Sodium: 134 mmol/L — ABNORMAL LOW (ref 135–145)

## 2023-03-14 LAB — RHEUMATOID FACTOR: Rheumatoid fact SerPl-aCnc: 13.4 [IU]/mL (ref <14.0)

## 2023-03-14 LAB — ANA: Anti Nuclear Antibody (ANA): NEGATIVE

## 2023-03-14 MED ORDER — PREDNISONE 20 MG PO TABS
40.0000 mg | ORAL_TABLET | Freq: Every day | ORAL | Status: AC
  Administered 2023-03-14 – 2023-03-16 (×3): 40 mg via ORAL
  Filled 2023-03-14 (×3): qty 2

## 2023-03-14 NOTE — Assessment & Plan Note (Addendum)
History of cardiac amyloidosis. Last Echo 50-55% G1DD. Follows closely with Dr. Gala Romney.  -States got Amvuttra injection Tuesday -Continue Vyndamax 61 mg daily -Continue Farxiga 10 mg daily -Consider restarting home enalapril, lasix if BP rises

## 2023-03-14 NOTE — Assessment & Plan Note (Addendum)
A1c 6.0. Home meds of Zepbound weekly (has not started), farxiga 10 mg daily. CBGs stable. -Continue farxiga 10 mg daily -rSSI with bedtime coverage

## 2023-03-14 NOTE — Evaluation (Signed)
Physical Therapy Evaluation  Patient Details Name: Russell Collier MRN: [id number] DOB: [birth year] Today's Date: 03/14/2023  History of Present Illness  Pt is a 63 y/o male admitted with L foot pain and sepsis secondary to cellulitis.  While in hospital, R foot started to hurt and pt started on gout meds.  Xrays -.  PMH: DM2, HTN, CAD, CHF, gout, obesity, PE, prior back surgery.   Clinical Impression  Pt admitted with above diagnosis. Pt currently with functional limitations due to the deficits listed below (see PT Problem List). At the time of PT eval pt reports slight improvement in symptoms in L foot but still very limited due to pain in the R foot. Pt was able to tolerate standing with RW for support, while keeping the RLE NWB for pain control. Unable to progress to gait training due to pain. Pt is hopeful that gout meds will allow for improvement in symptoms and he will be able to return home at d/c. Stairs will be the biggest barrier to pt returning home (14 to enter, and another 7 to get to main living area). Based on performance today, recommend post-acute therapy to maximize functional independence, safety, and return to PLOF. Pt will benefit from acute skilled PT to increase their independence and safety with mobility to allow discharge.           If plan is discharge home, recommend the following: Two people to help with walking and/or transfers;A lot of help with bathing/dressing/bathroom;Assistance with cooking/housework;Assist for transportation;Help with stairs or ramp for entrance   Can travel by private vehicle   No    Equipment Recommendations Rolling walker (2 wheels)  Recommendations for Other Services       Functional Status Assessment Patient has had a recent decline in their functional status and demonstrates the ability to make significant improvements in function in a reasonable and predictable amount of time.     Precautions / Restrictions Precautions Precautions:  Fall Precaution Comments: Pt unable to bear weight on R foot and only slightly on L foot. Restrictions Weight Bearing Restrictions Per Provider Order: No      Mobility  Bed Mobility               General bed mobility comments: Pt was received sitting up in the recliner.    Transfers Overall transfer level: Needs assistance Equipment used: Rolling walker (2 wheels) Transfers: Sit to/from Stand Sit to Stand: Mod assist           General transfer comment: Heavy mod assist to power up to full stand and gain/maintain standing balance. Pt able to bear weight through the LLE but unable to put R foot down on the floor due to pain.    Ambulation/Gait             Pre-gait activities: Pt attempted a hop forwards and backwards however with increased pain and attempts at gait were ended.    Stairs            Wheelchair Mobility     Tilt Bed    Modified Rankin (Stroke Patients Only)       Balance Overall balance assessment: Needs assistance Sitting-balance support: Feet supported Sitting balance-Leahy Scale: Normal       Standing balance-Leahy Scale: Poor                               Pertinent Vitals/Pain Pain Assessment Pain Assessment: Faces  Faces Pain Scale: Hurts worst Pain Location: B feet. R>L Pain Descriptors / Indicators: Aching, Shooting, Sore, Guarding, Grimacing Pain Intervention(s): Limited activity within patient's tolerance, Monitored during session, Repositioned    Home Living Family/patient expects to be discharged to:: Private residence Living Arrangements: Other (Comment) (cousin and roommates) Available Help at Discharge: Personal care attendant;Other (Comment) (care attendant 2 hours a day 5x week for cooking, cleaning and errands.) Type of Home: House Home Access: Stairs to enter Entrance Stairs-Rails: Can reach both Entrance Stairs-Number of Steps: 14 Alternate Level Stairs-Number of Steps: 7 Home Layout: Two  level Home Equipment: Rollator (4 wheels);Cane - single point Additional Comments: Pt has rollator but he cant get it up stairs so he only uses cane inside.  w/c and rollator would not fit in bathroom if he had them on main floor.    Prior Function Prior Level of Function : Independent/Modified Independent             Mobility Comments: Walks with cane some but sometimes with nothing if walking well. Uses rollator in community. ADLs Comments: Can do most all bathing and dressing/toileting without physical assist. Has assist with all IADLs and driving.     Extremity/Trunk Assessment   Upper Extremity Assessment Upper Extremity Assessment: Defer to OT evaluation    Lower Extremity Assessment Lower Extremity Assessment: RLE deficits/detail;LLE deficits/detail RLE Deficits / Details: Pt able to wiggle his toes a little bit, but unable to perform any ankle ROM or put any weight through the foot. 5/5 quad strength, 4/5 hamstring strength, 4+/5 hip flexor strength. LLE Deficits / Details: Pt able to wiggle toes and perform ankle ROM. Able to stand on the LLE but with increased pain with hopping. 5/5 quad strength, 4/5 hamstring strength, 4+/5 hip flexor strength.    Cervical / Trunk Assessment Cervical / Trunk Assessment: Back Surgery  Communication   Communication Communication: No apparent difficulties  Cognition Arousal: Alert Behavior During Therapy: WFL for tasks assessed/performed Overall Cognitive Status: Within Functional Limits for tasks assessed                                          General Comments      Exercises     Assessment/Plan    PT Assessment Patient needs continued PT services  PT Problem List Decreased range of motion;Decreased strength;Decreased activity tolerance;Decreased balance;Decreased mobility;Decreased knowledge of use of DME;Decreased safety awareness;Decreased knowledge of precautions;Pain       PT Treatment Interventions  DME instruction;Gait training;Stair training;Functional mobility training;Therapeutic activities;Therapeutic exercise;Balance training;Patient/family education    PT Goals (Current goals can be found in the Care Plan section)  Acute Rehab PT Goals Patient Stated Goal: Home at d/c PT Goal Formulation: With patient Time For Goal Achievement: 03/21/23 Potential to Achieve Goals: Good    Frequency Min 1X/week     Co-evaluation               AM-PAC PT "6 Clicks" Mobility  Outcome Measure Help needed turning from your back to your side while in a flat bed without using bedrails?: None Help needed moving from lying on your back to sitting on the side of a flat bed without using bedrails?: None Help needed moving to and from a bed to a chair (including a wheelchair)?: A Little Help needed standing up from a chair using your arms (e.g., wheelchair or bedside chair)?: A Lot  Help needed to walk in hospital room?: Total Help needed climbing 3-5 steps with a railing? : Total 6 Click Score: 15    End of Session Equipment Utilized During Treatment: Gait belt Activity Tolerance: Patient limited by pain Patient left: in chair;with call bell/phone within reach Nurse Communication: Mobility status PT Visit Diagnosis: Unsteadiness on feet (R26.81);Pain;Difficulty in walking, not elsewhere classified (R26.2) Pain - Right/Left:  (Bilateral) Pain - part of body: Ankle and joints of foot    Time: 8469-6295 PT Time Calculation (min) (ACUTE ONLY): 51 min   Charges:   PT Evaluation $PT Eval Moderate Complexity: 1 Mod PT Treatments $Gait Training: 23-37 mins PT General Charges $$ ACUTE PT VISIT: 1 Visit         Conni Slipper, PT, DPT Acute Rehabilitation Services Secure Chat Preferred Office: [phone]   Marylynn Pearson 03/14/2023, 2:58 PM

## 2023-03-14 NOTE — Progress Notes (Signed)
Daily Progress Note Intern Pager: [phone]  Patient name: Russell Collier Medical record number: [id number] Date of birth: 04-06-1960 Age: 63 y.o. Gender: male  Primary Care Provider: Jerre Simon, MD Consultants: None     Code Status: FULL   Pt Overview and Major Events to Date:  1/18 admitted   Assessment and Plan:   Russell Collier pertinent PMH/PSH DM2, HTN, CHF, CAD, OSA, hx PE 63 y.o. male here with bilateral feet pain concerning for gout flare.  Assessment & Plan Gout Has hx of gout. L Foot XR with diffuse soft tissue swelling and no evidence of osteomyelitis. Uric acid 8.0, started colchicine. D/c'ed antibiotics which were initially started for concern for cellulitis. Bcx NG2d. Patient remains afebrile. Pain of R foot now worse than L, unable to bear weight.  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 Last Echo 50-55% G1DD. Follows closely with Dr. Gala Romney.  -States got Amvuttra injection Tuesday -Continue Vyndamax 61 mg daily -Continue Farxiga 10 mg daily -Consider restarting home enalapril, lasix if BP rises Type 2 diabetes mellitus without complication, without long-term current use of insulin (HCC) A1c 6.0. Home meds of Zepbound weekly (has not started), farxiga 10 mg daily. CBGs stable. 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 He has not been able to stand. The pain is worst around the ankle and front of his foot. No side effects following taking colchicine yesterday. Tolerating PO well.   Objective: Temp:  [97.5 F (36.4 C)-99 F (37.2 C)] 97.9 F (36.6 C) (01/20 0529) Pulse Rate:  [67-86] 67 (01/20 0529) Resp:  [17-18] 17 (01/20 0529) BP: (120-141)/(55-74) 141/66 (01/20 0529) SpO2:  [96 %-99 %] 99 % (01/20 0529) Physical Exam: General: No acute distress. Resting comfortably in chair.  CV: Normal S1/S2. No extra heart sounds. Warm and well-perfused. Pulm: Breathing comfortably on room air. CTAB. No increased WOB. Abd: Soft, non-tender, non-distended. Skin/Ext:  Diffusely tender L and R feet and ankle with some swelling, no erythema. Feet equally warm. Able to move both feet spontaneously, though limited by pain. Psych: Pleasant and appropriate.    Laboratory: Most recent CBC Lab Results  Component Value Date   WBC 8.4 03/14/2023   HGB 12.6 (L) 03/14/2023   HCT 37.3 (L) 03/14/2023   MCV 89.4 03/14/2023   PLT 203 03/14/2023   Most recent BMP    Latest Ref Rng & Units 03/14/2023    5:47 AM  BMP  Glucose 70 - 99 mg/dL 96   BUN 8 - 23 mg/dL 19   Creatinine 1.47 - 1.24 mg/dL 8.29   Sodium 562 - 130 mmol/L 134   Potassium 3.5 -  5.1 mmol/L 3.9   Chloride 98 - 111 mmol/L 103   CO2 22 - 32 mmol/L 24   Calcium 8.9 - 10.3 mg/dL 8.7    Russell Quale, MD 03/14/2023, 7:28 AM  PGY-1, [facility] FPTS Intern pager: [phone], text pages welcome Secure chat group [facility] Teaching Service

## 2023-03-14 NOTE — Evaluation (Signed)
Occupational Therapy Evaluation Patient Details Name: Russell Collier MRN: [id number] DOB: 1960-07-13 Today's Date: 03/14/2023   History of Present Illness Pt is a 63 yo male admitted with L foot pain and sepsis secondary to cellulitis.  While in hospital, R foot started to hurt and pt started on gout meds.  Xrays -.  PMH: DM2, HTNN, CAD, CHF, gout, obesity, PE.   Clinical Impression   Pt admitted with the above diagnosis and has the deficits outlined below. Pt would benefit from cont OT to increase independence with basic adls back to baseline so he can d/c back home.  Pt at baseline is mod I with basic adls but has a caregiver that assists 5x week for 2 hours a day with most IADLS including driving for errands, laundry, cooking and cleaning.  Pt has 14 steps outside and 7 inside to access his home. At this time, pt has too much pain to bear weight through BLEs to be able to stand. For this reason, pt will need less than 3 hours of therapy a day to attempt to get back on his feet again to be able to safely live in his home.  If gout meds reduce pain and pt can stand and take some steps, pt may be able to transfer home once more mobile.  Will continue to see with focus on adls in standing.        If plan is discharge home, recommend the following: Two people to help with walking and/or transfers;A little help with bathing/dressing/bathroom;Assistance with cooking/housework;Assist for transportation;Help with stairs or ramp for entrance    Functional Status Assessment  Patient has had a recent decline in their functional status and demonstrates the ability to make significant improvements in function in a reasonable and predictable amount of time.  Equipment Recommendations  Other (comment) (tbd)    Recommendations for Other Services       Precautions / Restrictions Precautions Precautions: Fall Precaution Comments: Pt unable to bear weight on R foot and only slightly on L  foot. Restrictions Weight Bearing Restrictions Per Provider Order: No      Mobility Bed Mobility Overal bed mobility: Modified Independent             General bed mobility comments: Does well moving in bedd    Transfers Overall transfer level: Needs assistance Equipment used: 1 person hand held assist Transfers: Bed to chair/wheelchair/BSC            Lateral/Scoot Transfers: Min assist General transfer comment: Pt can only laterally scoot at this time.  When pt can put weight on his feet, stand pivot is best option      Balance Overall balance assessment: Needs assistance Sitting-balance support: Feet supported Sitting balance-Leahy Scale: Normal       Standing balance-Leahy Scale: Zero Standing balance comment: unable to stand at this timed                           ADL either performed or assessed with clinical judgement   ADL Overall ADL's : Needs assistance/impaired Eating/Feeding: Independent;Sitting   Grooming: Wash/dry hands;Wash/dry face;Oral care;Applying deodorant;Set up;Sitting   Upper Body Bathing: Set up;Sitting   Lower Body Bathing: Minimal assistance;Sitting/lateral leans;Cueing for compensatory techniques Lower Body Bathing Details (indicate cue type and reason): Pt unable to stand at this time so must lean side to side to wash backside. Upper Body Dressing : Set up;Sitting   Lower Body Dressing: Moderate assistance;Sitting/lateral leans;Cueing  for compensatory techniques Lower Body Dressing Details (indicate cue type and reason): Difficult to dress self without being able to stand but can by leaning side to side on the bed Toilet Transfer: Moderate assistance;Anterior/posterior;BSC/3in1;Requires drop arm;Requires Warden/ranger Details (indicate cue type and reason): Pt unable to transfer with squat pivot due to pain in B feet. Pt can do anterior/post transfer from bed or could do lateral scoot if pt had drop arm  BSC. Toileting- Clothing Manipulation and Hygiene: Moderate assistance;Sitting/lateral lean;Cueing for compensatory techniques       Functional mobility during ADLs: Moderate assistance General ADL Comments: Pt limited to sitting adls only due to pain in feet possibly due to gout     Vision Baseline Vision/History: 1 Wears glasses Ability to See in Adequate Light: 0 Adequate Patient Visual Report: No change from baseline Vision Assessment?: No apparent visual deficits     Perception Perception: Within Functional Limits       Praxis Praxis: WFL       Pertinent Vitals/Pain Pain Assessment Pain Assessment: Faces Faces Pain Scale: Hurts worst Pain Location: B feet. R>L Pain Descriptors / Indicators: Aching, Shooting, Sore, Guarding, Grimacing Pain Intervention(s): Limited activity within patient's tolerance, Monitored during session, Repositioned     Extremity/Trunk Assessment Upper Extremity Assessment Upper Extremity Assessment: LUE deficits/detail;Right hand dominant LUE Deficits / Details: Pt had surgery to L shoulder. Shoulder strength 3+/5.  Good ROM but limited by pain LUE: Unable to fully assess due to pain LUE Sensation: history of peripheral neuropathy LUE Coordination: decreased gross motor   Lower Extremity Assessment Lower Extremity Assessment: Defer to PT evaluation   Cervical / Trunk Assessment Cervical / Trunk Assessment: Back Surgery   Communication Communication Communication: No apparent difficulties   Cognition Arousal: Alert Behavior During Therapy: WFL for tasks assessed/performed Overall Cognitive Status: Within Functional Limits for tasks assessed                                       General Comments  Pt most limited by inability to put weight on BLEs.    Exercises     Shoulder Instructions      Home Living Family/patient expects to be discharged to:: Private residence Living Arrangements: Other (Comment) (cousin and  roommates) Available Help at Discharge: Personal care attendant;Other (Comment) (care attendant 2 hours a day 5x week for cooking, cleaning and errands.) Type of Home: House Home Access: Stairs to enter [employer] of Steps: 14   Home Layout: Two level Alternate Level Stairs-Number of Steps: 7   Bathroom Shower/Tub: Chief Strategy Officer: Handicapped height Bathroom Accessibility: No   Home Equipment: Rollator (4 wheels);Cane - single point   Additional Comments: Pt has rollator but he cant get it up stairs so he only uses cane inside.  w/c and rollator would not fit in bathroom if he had them on main floor.      Prior Functioning/Environment Prior Level of Function : Independent/Modified Independent             Mobility Comments: Walks with cane some but sometimes with nothing if walking well. Uses rollator in community. ADLs Comments: Can do most all bathing and dressing/toileting without physical assist. Has assist with all IADLs and driving.        OT Problem List: Decreased strength;Decreased activity tolerance;Impaired balance (sitting and/or standing);Impaired sensation;Obesity;Impaired UE functional use;Pain  OT Treatment/Interventions: Self-care/ADL training;Therapeutic activities;Balance training;DME and/or AE instruction    OT Goals(Current goals can be found in the care plan section) Acute Rehab OT Goals Patient Stated Goal: to get rid of foot pain so he can walk OT Goal Formulation: With patient Time For Goal Achievement: 03/28/23 Potential to Achieve Goals: Good ADL Goals Pt Will Perform Grooming: with supervision;standing Pt Will Perform Lower Body Bathing: with contact guard assist;sit to/from stand Pt Will Perform Lower Body Dressing: with contact guard assist;sit to/from stand Pt Will Transfer to Toilet: with contact guard assist;ambulating Pt Will Perform Toileting - Clothing Manipulation and hygiene: with contact guard  assist;sit to/from stand Pt Will Perform Tub/Shower Transfer: Tub transfer;with min assist;ambulating  OT Frequency: Min 1X/week    Co-evaluation              AM-PAC OT "6 Clicks" Daily Activity     Outcome Measure Help from another person eating meals?: None Help from another person taking care of personal grooming?: A Little Help from another person toileting, which includes using toliet, bedpan, or urinal?: A Lot Help from another person bathing (including washing, rinsing, drying)?: A Little Help from another person to put on and taking off regular upper body clothing?: A Little Help from another person to put on and taking off regular lower body clothing?: A Little 6 Click Score: 18   End of Session Equipment Utilized During Treatment: Rolling walker (2 wheels) Nurse Communication: Mobility status  Activity Tolerance: Patient limited by pain Patient left: in chair;with call bell/phone within reach  OT Visit Diagnosis: Other abnormalities of gait and mobility (R26.89);Pain Pain - Right/Left: Right Pain - part of body: Leg;Ankle and joints of foot                Time: 0109-3235 OT Time Calculation (min): 34 min Charges:  OT General Charges $OT Visit: 1 Visit OT Evaluation $OT Eval Moderate Complexity: 1 Mod OT Treatments $Self Care/Home Management : 8-22 mins  Hope Budds 03/14/2023, 8:10 AM

## 2023-03-14 NOTE — Progress Notes (Signed)
PT Cancellation Note  Patient Details Name: Russell Collier MRN: [id number] DOB: 06-24-1960   Cancelled Treatment:    Reason Eval/Treat Not Completed: Discussed pt case with OT. At this time, pain is significantly limiting mobility. Noted gout meds on board. Will hold on PT evaluation at this time and attempt later today, in an effort to let meds hopefully improve pain and tolerance for functional activity prior to session. Please feel free to secure chat me with questions regarding this plan.    Marylynn Pearson 03/14/2023, 9:46 AM  Conni Slipper, PT, DPT Acute Rehabilitation Services Secure Chat Preferred Office: [phone]

## 2023-03-14 NOTE — Assessment & Plan Note (Addendum)
Has hx of gout. L Foot XR with diffuse soft tissue swelling and no evidence of osteomyelitis. Uric acid 8.0, started colchicine. D/c'ed antibiotics which were initially started for concern for cellulitis. Bcx NG2d. Patient remains afebrile. Pain of R foot now worse than L, unable to bear weight.  -S/p colchicine 1.2, transition to 0.6 daily today  -Tylenol 1g q6 scheduled -Dilauded 0.5 q4prn  -Conitnue home lyrica 100 TID -Start prednisone 40 daily 3 days -PT/OT eval and treat -Consider podiatry consult if not improving -Consider allopurinol at discharge - holding during admission iso acute flare -AM CBC, CMP

## 2023-03-14 NOTE — TOC Progression Note (Signed)
Transition of Care [facility]) - Progression Note    Patient Details  Name: Oak Bonfante MRN: [id number] Date of Birth: 09/29/1960  [facility] CM/SW Contact  Ronny Bacon, RN Phone Number: 03/14/2023, 3:33 PM  Clinical Narrative:    Outpatient PT referral # [phone], placed for [facility].   Expected Discharge Plan: OP Rehab Barriers to Discharge: Continued Medical Work up, Other (must enter comment) (Needs OP services)  Expected Discharge Plan and Services   Discharge Planning Services: CM Consult Post Acute Care Choice: Resumption of Svcs/PTA Provider Living arrangements for the past 2 months: Single Family Home                                       Social Determinants of Health (SDOH) Interventions SDOH Screenings   Food Insecurity: No Food Insecurity (03/13/2023)  Housing: Low Risk  (03/13/2023)  Transportation Needs: No Transportation Needs (03/13/2023)  Utilities: Not At Risk (03/13/2023)  Alcohol Screen: Low Risk  (11/14/2020)  Depression (PHQ2-9): Low Risk  (10/18/2022)  Financial Resource Strain: High Risk (11/14/2020)  Physical Activity: Insufficiently Active (05/11/2021)  Social Connections: Moderately Isolated (10/16/2018)  Stress: No Stress Concern Present (10/16/2018)  Tobacco Use: High Risk (03/12/2023)    Readmission Risk Interventions     No data to display

## 2023-03-14 NOTE — TOC Initial Note (Signed)
Transition of Care [facility]) - Initial/Assessment Note    Patient Details  Name: Russell Collier MRN: [id number] Date of Birth: 1960/06/06  Transition of Care [facility]) CM/SW Contact:    Carley Hammed, LCSW Phone Number: 03/14/2023, 3:22 PM  Clinical Narrative:                 CSW met with pt at bedside to discuss SNF recommendation. Per pt he was at [facility] previously and does not want to do SNF again. Pt hopes the Gout medicine will start to work and he will be able to return home. Pt requests a referral for aquatic therapy on [address], also agreeable to OP PT. CM consulted for assistance with setting up OP appointments. Pt requested education on Gout so he can start to manage it. CSW let medical team know about pt's dispo preference.   Expected Discharge Plan: OP Rehab Barriers to Discharge: Continued Medical Work up, Other (must enter comment) (Needs OP services)   Patient Goals and CMS Choice Patient states their goals for this hospitalization and ongoing recovery are:: Pt would like to be able to return home. CMS Medicare.gov Compare Post Acute Care list provided to:: Patient Choice offered to / list presented to : Patient      Expected Discharge Plan and Services   Discharge Planning Services: CM Consult Post Acute Care Choice: Resumption of Svcs/PTA Provider Living arrangements for the past 2 months: Single Family Home                                      Prior Living Arrangements/Services Living arrangements for the past 2 months: Single Family Home Lives with:: Self Patient language and need for interpreter reviewed:: Yes Do you feel safe going back to the place where you live?: Yes      Need for Family Participation in Patient Care: Yes (Comment) Care giver support system in place?: No (comment) Current home services: DME, Homehealth aide Criminal Activity/Legal Involvement Pertinent to Current Situation/Hospitalization: No - Comment as  needed  Activities of Daily Living   ADL Screening (condition at time of admission) Independently performs ADLs?: No Does the patient have a NEW difficulty with bathing/dressing/toileting/self-feeding that is expected to last >3 days?: Yes (Initiates electronic notice to provider for possible OT consult) Does the patient have a NEW difficulty with getting in/out of bed, walking, or climbing stairs that is expected to last >3 days?: Yes (Initiates electronic notice to provider for possible PT consult) Does the patient have a NEW difficulty with communication that is expected to last >3 days?: No Is the patient deaf or have difficulty hearing?: No Does the patient have difficulty seeing, even when wearing glasses/contacts?: No Does the patient have difficulty concentrating, remembering, or making decisions?: No  Permission Sought/Granted Permission sought to share information with : Family Supports Permission granted to share information with : No              Emotional Assessment Appearance:: Appears stated age Attitude/Demeanor/Rapport: Engaged Affect (typically observed): Appropriate Orientation: : Oriented to Self, Oriented to Place, Oriented to  Time, Oriented to Situation Alcohol / Substance Use: Not Applicable Psych Involvement: No (comment)  Admission diagnosis:  Cellulitis of left lower extremity [L03.116] Cellulitis and abscess of lower extremity [L03.119, L02.419] Patient Active Problem List   Diagnosis Date Noted   Foot pain, bilateral 03/13/2023   Gout 03/12/2023   Right flank  hematoma, subsequent encounter 01/04/2023   Acute pain of right thigh 08/13/2022   HSV (herpes simplex virus) infection 01/18/2022   Unspecified skin changes 08/21/2021   Pulmonary hypertension, primary (HCC)    Cellulitis 07/09/2021   Urinary tract infection without hematuria    Sepsis (HCC) 06/28/2021   Community acquired pneumonia    Gout 02/26/2021   Skin tag 02/12/2021   Skin cyst  02/12/2021   Anticoagulant long-term use 01/29/2021   Dermoid cyst of skin of nose 12/24/2020   Strain of right calf muscle 07/01/2020   Status post lumbar spinal fusion 06/06/2020   Impaired mobility and ADLs 04/23/2020   Midline low back pain 04/23/2020   Neuropathy, 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   COPD exacerbation (HCC) 03/20/2020   Type 2 diabetes mellitus with diabetic peripheral angiopathy without gangrene, without long-term current use of insulin (HCC) 03/20/2020   Coagulation defect (HCC) 12/11/2019   Fatigue 12/10/2019   Frequent epistaxis 10/30/2019   Disc degeneration, 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   Ganglion of right knee 09/27/2019   Lumbar radiculopathy 09/27/2019   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HCC)    Respiratory failure (HCC) 07/27/2019   Primary osteoarthritis of right knee 07/24/2019   Chronic pain 05/31/2019   AKI (acute kidney injury) (HCC) 05/31/2019   Acute foot pain 05/16/2019   Chronic knee pain 04/16/2019   Diabetes (HCC) 04/16/2019   Esophageal reflux 02/06/2019   Chronic skin ulcer of lower leg (HCC) 12/21/2018   S/P drug eluting coronary stent placement    Coronary artery disease involving native coronary artery of native heart with unstable angina pectoris (HCC) 12/20/2018   Unstable angina (HCC)    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   Acute on chronic diastolic heart failure (HCC)    Shortness of breath    Idiopathic peripheral neuropathy    Chronic diastolic heart failure (HCC)    Abnormal ankle brachial index (ABI)    History of cocaine abuse (HCC) 08/20/2018   Marijuana use 08/20/2018   Morbid obesity with BMI of 40.0-44.9, adult (HCC) 08/20/2018   Dyspepsia    Morbid obesity (HCC)    OSA on CPAP    CAD (coronary artery disease) 03/30/2018   Tobacco abuse 02/03/2018   Hyperlipidemia 02/03/2018   Acute respiratory failure with hypoxia (HCC) 02/02/2018   Chronic congestive heart failure  Familial TTR Cardiac Amyloidosis    Keratoconjunctivitis sicca of left eye  not specified as Sjogren's 09/06/2017   Long term current use of oral hypoglycemic drug 09/06/2017   Mechanical ectropion of left lower eyelid 09/06/2017   Nuclear sclerotic cataract of both eyes 09/06/2017   Refractive amblyopia, left 09/06/2017   Type 2 diabetes mellitus without complication, without long-term current use of insulin (HCC) 09/06/2017   Hyperopia of both eyes with astigmatism and presbyopia 09/06/2017   Essential hypertension    Neuropathy    PCP:  Jerre Simon, MD Pharmacy:   [facility] [address], [place] - [address] AT [facility] [address] [place] [address] Phone: [phone] Fax: [phone]     Social Drivers of Health (SDOH) Social History: SDOH Screenings   Food Insecurity: No Food Insecurity (03/13/2023)  Housing: Low Risk  (03/13/2023)  Transportation Needs: No Transportation Needs (03/13/2023)  Utilities: Not At Risk (03/13/2023)  Alcohol Screen: Low Risk  (11/14/2020)  Depression (PHQ2-9): Low Risk  (10/18/2022)  Financial Resource Strain: High Risk (11/14/2020)  Physical Activity: Insufficiently Active (05/11/2021)  Social Connections: Moderately Isolated (10/16/2018)  Stress: No Stress Concern Present (10/16/2018)  Tobacco  Use: High Risk (03/12/2023)   SDOH Interventions:     Readmission Risk Interventions     No data to display

## 2023-03-15 DIAGNOSIS — M109 Gout, unspecified: Secondary | ICD-10-CM | POA: Diagnosis not present

## 2023-03-15 LAB — CBC
HCT: 34.6 % — ABNORMAL LOW (ref 39.0–52.0)
Hemoglobin: 11.7 g/dL — ABNORMAL LOW (ref 13.0–17.0)
MCH: 30.1 pg (ref 26.0–34.0)
MCHC: 33.8 g/dL (ref 30.0–36.0)
MCV: 88.9 fL (ref 80.0–100.0)
Platelets: 210 10*3/uL (ref 150–400)
RBC: 3.89 MIL/uL — ABNORMAL LOW (ref 4.22–5.81)
RDW: 14.9 % (ref 11.5–15.5)
WBC: 10.7 10*3/uL — ABNORMAL HIGH (ref 4.0–10.5)
nRBC: 0 % (ref 0.0–0.2)

## 2023-03-15 LAB — BASIC METABOLIC PANEL
Anion gap: 6 (ref 5–15)
BUN: 23 mg/dL (ref 8–23)
CO2: 25 mmol/L (ref 22–32)
Calcium: 8.6 mg/dL — ABNORMAL LOW (ref 8.9–10.3)
Chloride: 103 mmol/L (ref 98–111)
Creatinine, Ser: 1.15 mg/dL (ref 0.61–1.24)
GFR, Estimated: >60 mL/min (ref >60)
Glucose, Bld: 106 mg/dL — ABNORMAL HIGH (ref 70–99)
Potassium: 4.2 mmol/L (ref 3.5–5.1)
Sodium: 134 mmol/L — ABNORMAL LOW (ref 135–145)

## 2023-03-15 LAB — GLUCOSE, CAPILLARY
Glucose-Capillary: 135 mg/dL — ABNORMAL HIGH (ref 70–99)
Glucose-Capillary: 122 mg/dL — ABNORMAL HIGH (ref 70–99)
Glucose-Capillary: 121 mg/dL — ABNORMAL HIGH (ref 70–99)
Glucose-Capillary: 134 mg/dL — ABNORMAL HIGH (ref 70–99)

## 2023-03-15 MED ORDER — FUROSEMIDE 40 MG PO TABS
40.0000 mg | ORAL_TABLET | Freq: Two times a day (BID) | ORAL | Status: DC
  Administered 2023-03-15 – 2023-03-16 (×3): 40 mg via ORAL
  Filled 2023-03-15 (×4): qty 1

## 2023-03-15 MED ORDER — COLCHICINE 0.6 MG PO TABS
0.6000 mg | ORAL_TABLET | Freq: Every day | ORAL | Status: DC
  Administered 2023-03-15 – 2023-03-16 (×2): 0.6 mg via ORAL
  Filled 2023-03-15 (×2): qty 1

## 2023-03-15 MED ORDER — OXYCODONE HCL 5 MG PO TABS
5.0000 mg | ORAL_TABLET | Freq: Four times a day (QID) | ORAL | Status: DC | PRN
  Administered 2023-03-16: 5 mg via ORAL
  Filled 2023-03-15: qty 1

## 2023-03-15 MED ORDER — ALLOPURINOL 100 MG PO TABS
100.0000 mg | ORAL_TABLET | Freq: Every day | ORAL | Status: DC
Start: 2023-03-15 — End: 2023-03-16
  Administered 2023-03-15 – 2023-03-16 (×2): 100 mg via ORAL
  Filled 2023-03-15 (×2): qty 1

## 2023-03-15 NOTE — TOC Progression Note (Signed)
Transition of Care (TOC) - Progression Note   Previous TOC member arranged OP PT.   Patient from home alone but has a aide through [facility] that comes 5 days a week for 2.5 hours.   Patient has a rolling walker, Rollator and cane at home. Entered order and asked MD to sign   Patient requesting 3 in 1 same ordered through [employer] .   When patient is discharged his aide may be able to provide transportation home, if not he is requesting a cab voucher home.  Patient Details  Name: Russell Collier MRN: [id number] Date of Birth: 1961-01-16  Transition of Care [facility]) CM/SW Contact  Abbigayle Toole, Adria Devon, RN Phone Number: 03/15/2023, 12:45 PM  Clinical Narrative:       Expected Discharge Plan: OP Rehab Barriers to Discharge: Continued Medical Work up, Other (must enter comment) (Needs OP services)  Expected Discharge Plan and Services   Discharge Planning Services: CM Consult Post Acute Care Choice: Resumption of Svcs/PTA Provider Living arrangements for the past 2 months: Single Family Home                                       Social Determinants of Health (SDOH) Interventions SDOH Screenings   Food Insecurity: No Food Insecurity (03/13/2023)  Housing: Low Risk  (03/13/2023)  Transportation Needs: No Transportation Needs (03/13/2023)  Utilities: Not At Risk (03/13/2023)  Alcohol Screen: Low Risk  (11/14/2020)  Depression (PHQ2-9): Low Risk  (10/18/2022)  Financial Resource Strain: High Risk (11/14/2020)  Physical Activity: Insufficiently Active (05/11/2021)  Social Connections: Moderately Isolated (10/16/2018)  Stress: No Stress Concern Present (10/16/2018)  Tobacco Use: High Risk (03/12/2023)    Readmission Risk Interventions     No data to display

## 2023-03-15 NOTE — Assessment & Plan Note (Addendum)
Has hx of gout. L Foot XR with diffuse soft tissue swelling and no evidence of osteomyelitis. Uric acid 8.0. D/c'ed antibiotics which were initially started for concern for cellulitis. Bcx NG3d. Patient remains afebrile. Pain improving, able to ambulate with walker today. Reported L shoulder pain as well today, suspect aspect of gout given current flare episode vs arthritis given hx of shoulder surgery. -Continue colchicine 0.6 daily today (end 1/23) -Start allopurinol 100 daily  -Tylenol 1g q6 scheduled - D/c dilaudid, add oxycodone 5 q6 prn  -Conitnue home lyrica 100 TID -Continue prednisone 40 daily (1/20-1/22)  -Consider shoulder imaging if pain persists  -PT/OT eval and treat -AM CBC, CMP

## 2023-03-15 NOTE — Assessment & Plan Note (Signed)
A1c 6.0. Home meds of Zepbound weekly (has not started), farxiga 10 mg daily. CBGs stable. -Continue farxiga 10 mg daily -rSSI with bedtime coverage

## 2023-03-15 NOTE — Discharge Instructions (Signed)
Dear Russell Collier,   Thank you so much for allowing [place] to be part of your care!  You were admitted to [facility] for gout flare. Pain control and treatment was given     POST-HOSPITAL & CARE INSTRUCTIONS Follow up with pcp  Please let PCP/Specialists know of any changes that were made.  Please see medications section of this packet for any medication changes.   DOCTOR'S APPOINTMENT & FOLLOW UP CARE INSTRUCTIONS  Future Appointments  Date Time Provider Department Center  03/18/2023  1:30 PM Jerre Simon, MD [facility]  04/29/2023 10:00 AM [facility] PA/NP [facility] None  06/07/2023 11:00 AM [facility] PHARMACY [facility] None  06/09/2023  3:45 PM Louann Sjogren, DPM [facility] None  06/22/2023  2:00 PM [facility] VASC 4 [facility]  06/22/2023  2:30 PM [facility] VASC 4 [facility]  06/22/2023  3:00 PM [facility] PA [facility]    RETURN PRECAUTIONS: -worsening foot pain and swelling  -chest pain  - shortness of breath  Take care and be well!  Family Medicine Teaching Service  [place]  [facility]  [address] [place], [place] [address] [phone]

## 2023-03-15 NOTE — Plan of Care (Signed)

## 2023-03-15 NOTE — Progress Notes (Addendum)
Physical Therapy Treatment Patient Details Name: Russell Collier MRN: [id number] DOB: 06/08/1960 Today's Date: 03/15/2023   History of Present Illness Pt is a 63 y/o male admitted with L foot pain and sepsis secondary to cellulitis.  While in hospital, R foot started to hurt and pt started on gout meds. Xrays negative. PMH: DM2, HTN, CAD, CHF, gout, obesity, PE, prior back surgery.    PT Comments  Pt received up in chair without arm rests near door of room, pt agreeable to therapy session and expressed desire to work on ambulation/stair negotiation today as he prefers home to SNF placement. Pt needing up to CGA for safety with cues for improved body mechanics for transfers and gait, with chair follow provided for safety given c/o pain however pt did not need seated break with longer household distance gait trial out of unit to stairwell and even after performing flight of stairs x2 with up to minA, pt did not need seated break prior to return to his room. Discussed pt progress with supervising PT Doreene Nest and pt, updated according to his excellent progress toward his goals, DME also updated per discussion as per patient, RW does not fit in his bathroom and he can't ambulate yet with only cane due to severe constant LE pain from gout. Pt continues to benefit from PT services to progress toward functional mobility goals.     If plan is discharge home, recommend the following: Assistance with cooking/housework;Assist for transportation;Help with stairs or ramp for entrance;A little help with walking and/or transfers;A little help with bathing/dressing/bathroom   Can travel by private vehicle     Yes  Equipment Recommendations  Rolling walker (2 wheels);BSC/3in1 ((regular width RW, pt reports bari RW is too wide for his home))    Recommendations for Other Services       Precautions / Restrictions Precautions Precautions: Fall Precaution Comments: premedication helpful -pain Restrictions Weight  Bearing Restrictions Per Provider Order: No     Mobility  Bed Mobility               General bed mobility comments: Pt was received sitting up in chair ~84ft from EOB    Transfers Overall transfer level: Needs assistance Equipment used: Rolling walker (2 wheels) Transfers: Sit to/from Stand Sit to Stand: Contact guard assist           General transfer comment: from chair without arm rests (pt prefers to push straight down into RW handles and no posterior LOB), cues for safe UE placement with arm rest and without as well as foot placement for decreased pain    Ambulation/Gait Ambulation/Gait assistance: Contact guard assist, +2 safety/equipment Gait Distance (Feet): 200 Feet Assistive device: Rolling walker (2 wheels) Gait Pattern/deviations: Step-to pattern, Step-through pattern, Antalgic Gait velocity: decreased, variable but always <0.4 m/s     General Gait Details: Fair RW management, min cues for step sequencing initially for decreased pain, pt progressed from step-to pattern to step-through pattern as pain improved, chair follow for safety given pt previous session pain was very severe. Pt attempts a couple steps with cane and CGA but pt reports pain was too high to not use BUE support so switched back to RW.   Stairs Stairs: Yes Stairs assistance: Min assist (at times CGA) Stair Management: One rail Left, Step to pattern, Alternating pattern, Forwards Number of Stairs: 12 (x2 trials (24 total)) General stair comments: Step-to pattern ascending initially for pain mgmt and step-to pattern descending both trials. On second trial, pt  performs with reciprocal pattern ascending only. HHA on hand opposite rail, although pt does not have second rail, plan to bring cane next trial to see if pt can ascend/descend modI with cane.   Wheelchair Mobility     Tilt Bed    Modified Rankin (Stroke Patients Only)       Balance Overall balance assessment: Needs  assistance Sitting-balance support: Feet supported Sitting balance-Leahy Scale: Normal     Standing balance support: Single extremity supported, Bilateral upper extremity supported, During functional activity Standing balance-Leahy Scale: Poor Standing balance comment: Fair with RW, poor with only single UE support with dynamic tasks (cane)                            Cognition Arousal: Alert Behavior During Therapy: WFL for tasks assessed/performed Overall Cognitive Status: Within Functional Limits for tasks assessed                                          Exercises Other Exercises Other Exercises: Pt attempting tandem stance unprompted but not following instruction to attempt with BUE progressing up to single UE support    General Comments General comments (skin integrity, edema, etc.): no acute s/sx distress other than expected c/o pain; pt requesting assist to shower and NT notified. friend (aide?) Jelli arriving to room after PT session.      Pertinent Vitals/Pain Pain Assessment Pain Assessment: Faces Faces Pain Scale: Hurts little more Pain Location: B feet, R>L Pain Descriptors / Indicators: Aching, Sore, Guarding, Grimacing Pain Intervention(s): Monitored during session, Premedicated before session, Repositioned    Home Living                          Prior Function            PT Goals (current goals can now be found in the care plan section) Acute Rehab PT Goals Patient Stated Goal: Home at d/c PT Goal Formulation: With patient Time For Goal Achievement: 03/21/23 Progress towards PT goals: Progressing toward goals    Frequency    Min 1X/week      PT Plan      Co-evaluation              AM-PAC PT "6 Clicks" Mobility   Outcome Measure  Help needed turning from your back to your side while in a flat bed without using bedrails?: None Help needed moving from lying on your back to sitting on the side of a  flat bed without using bedrails?: A Little (flat bed/no rails) Help needed moving to and from a bed to a chair (including a wheelchair)?: A Little Help needed standing up from a chair using your arms (e.g., wheelchair or bedside chair)?: A Little Help needed to walk in hospital room?: A Little Help needed climbing 3-5 steps with a railing? : A Little 6 Click Score: 19    End of Session Equipment Utilized During Treatment: Gait belt Activity Tolerance: Patient tolerated treatment well Patient left: in chair;with call bell/phone within reach;with chair alarm set;Other (comment) (reclined) Nurse Communication: Mobility status;Other (comment) (pt requesting a shower) PT Visit Diagnosis: Unsteadiness on feet (R26.81);Pain;Difficulty in walking, not elsewhere classified (R26.2) Pain - Right/Left:  (Bilateral) Pain - part of body: Ankle and joints of foot     Time: 1129-1207  PT Time Calculation (min) (ACUTE ONLY): 38 min  Charges:    $Gait Training: 23-37 mins $Therapeutic Activity: 8-22 mins PT General Charges $$ ACUTE PT VISIT: 1 Visit                     Filip Luten P., PTA Acute Rehabilitation Services Secure Chat Preferred 9a-5:30pm Office: [phone]    Dorathy Kinsman [facility] 03/15/2023, 12:41 PM

## 2023-03-15 NOTE — Progress Notes (Signed)
Gout education given to patient.

## 2023-03-15 NOTE — Plan of Care (Signed)

## 2023-03-15 NOTE — Assessment & Plan Note (Addendum)
History of cardiac amyloidosis. Last Echo 50-55% G1DD. Follows closely with Dr. Gala Romney.  -States got Amvuttra injection Tuesday -Continue Vyndamax 61 mg daily -Continue Farxiga 10 mg daily -Resume home lasix 40 BID, monitor K

## 2023-03-15 NOTE — Progress Notes (Addendum)
Daily Progress Note Intern Pager: [phone]  Patient name: Russell Collier Medical record number: [id number] Date of birth: [birth year] Age: 63 y.o. Gender: male  Primary Care Provider: Jerre Simon, MD Consultants: None     Code Status: FULL   Pt Overview and Major Events to Date:  1/18 admitted   Assessment and Plan: [name] pertinent PMH/PSH DM2, HTN, CHF, CAD, OSA, hx PE 63 y.o. male here with bilateral feet pain concerning for gout flare.  Assessment & Plan Gout Has hx of gout. L Foot XR with diffuse soft tissue swelling and no evidence of osteomyelitis. Uric acid 8.0. D/c'ed antibiotics which were initially started for concern for cellulitis. Bcx NG3d. Patient remains afebrile. Pain improving, able to ambulate with walker today. Reported L shoulder pain as well today, suspect aspect of gout given current flare episode vs arthritis given hx of shoulder surgery. -Continue colchicine 0.6 daily today (end 1/23) -Start allopurinol 100 daily  -Tylenol 1g q6 scheduled - D/c dilaudid, add oxycodone 5 q6 prn  -Conitnue home lyrica 100 TID -Continue prednisone 40 daily (1/20-1/22)  -Consider shoulder imaging if pain persists  -PT/OT eval and treat -AM CBC, CMP Chronic congestive heart failure  Familial TTR Cardiac Amyloidosis History of cardiac amyloidosis. Last Echo 50-55% G1DD. Follows closely with Dr. Gala Romney.  -States got Amvuttra injection Tuesday -Continue Vyndamax 61 mg daily -Continue Farxiga 10 mg daily -Resume home lasix 40 BID, monitor K  Type 2 diabetes mellitus without complication, without long-term current use of insulin (HCC) A1c 6.0. Home meds of Zepbound weekly (has not started), farxiga 10 mg daily. CBGs stable. -Continue farxiga 10 mg daily -rSSI with bedtime coverage  Chronic and Stable Issues: HTN-BP remains stable during admission. Previously on home enalapril. Plan to switch to losartan at discharge given uric acid lowering association  OSA on  CPAP-ordered Morbid obesity-holding Zepbound  HLD-Crestor 10 mg daily Tobacco Use Hx of PE-Eliquis 2.5 BID PAD- Eliquis 2.5, Plavix 75, Crestor 10 daily HSV-Continue valtrex prophylaxis GERD-Protonix 40 COPD-Anoro Ellipta     FEN/GI: Heart/Carb Modified PPx: Eliquis Dispo: Home/SNF pending clinical improvement   Subjective:  Patient doing better today, able to ambulate with walker with bearing most of weight on L foot. Has questions about gout - discussed. Reports his L shoulder has also been bothering him this week. Tolerating PO well.   Objective: Temp:  [98.1 F (36.7 C)-98.3 F (36.8 C)] 98.1 F (36.7 C) (01/21 0550) Pulse Rate:  [61-70] 61 (01/21 0550) Resp:  [16-17] 17 (01/20 1736) BP: (112-137)/(58-70) 128/58 (01/21 0550) SpO2:  [97 %-100 %] 100 % (01/21 0550) Physical Exam: General: No acute distress. Ambulated from bathroom to bed with walker.  Cardiovascular: Normal S1/S2. No extra heart sounds. Warm and well-perfused. Respiratory: Breathing comfortably on room air. CTAB. No increased WOB.  Abdomen: Soft, nontender, nondistended.  Extremities: Non-erythematous feet bilaterally. Improving feet edema, tender to palpation. Able to spontaneously move each foot. L shoulder nontender to palpation, only tender with movement, ROM limited by pain.   Laboratory: Most recent CBC Lab Results  Component Value Date   WBC 10.7 (H) 03/15/2023   HGB 11.7 (L) 03/15/2023   HCT 34.6 (L) 03/15/2023   MCV 88.9 03/15/2023   PLT 210 03/15/2023   Most recent BMP    Latest Ref Rng & Units 03/15/2023    6:02 AM  BMP  Glucose 70 - 99 mg/dL 657   BUN 8 - 23 mg/dL 23   Creatinine 8.46 - 1.24 mg/dL  1.15   Sodium 135 - 145 mmol/L 134   Potassium 3.5 - 5.1 mmol/L 4.2   Chloride 98 - 111 mmol/L 103   CO2 22 - 32 mmol/L 25   Calcium 8.9 - 10.3 mg/dL 8.6    Ivery Quale, MD 03/15/2023, 7:15 AM  PGY-1, [facility] Family Medicine FPTS Intern pager: [phone], text pages welcome Secure  chat group [facility] Teaching Service

## 2023-03-16 ENCOUNTER — Other Ambulatory Visit: Payer: Self-pay

## 2023-03-16 ENCOUNTER — Other Ambulatory Visit (HOSPITAL_COMMUNITY): Payer: Self-pay

## 2023-03-16 DIAGNOSIS — M109 Gout, unspecified: Secondary | ICD-10-CM | POA: Diagnosis not present

## 2023-03-16 LAB — BASIC METABOLIC PANEL WITH GFR
Anion gap: 9 (ref 5–15)
BUN: 26 mg/dL — ABNORMAL HIGH (ref 8–23)
CO2: 25 mmol/L (ref 22–32)
Calcium: 8.8 mg/dL — ABNORMAL LOW (ref 8.9–10.3)
Chloride: 100 mmol/L (ref 98–111)
Creatinine, Ser: 1.27 mg/dL — ABNORMAL HIGH (ref 0.61–1.24)
GFR, Estimated: >60 mL/min
Glucose, Bld: 110 mg/dL — ABNORMAL HIGH (ref 70–99)
Potassium: 3.7 mmol/L (ref 3.5–5.1)
Sodium: 134 mmol/L — ABNORMAL LOW (ref 135–145)

## 2023-03-16 LAB — CBC
HCT: 37 % — ABNORMAL LOW (ref 39.0–52.0)
Hemoglobin: 12.4 g/dL — ABNORMAL LOW (ref 13.0–17.0)
MCH: 29.9 pg (ref 26.0–34.0)
MCHC: 33.5 g/dL (ref 30.0–36.0)
MCV: 89.2 fL (ref 80.0–100.0)
Platelets: 254 K/uL (ref 150–400)
RBC: 4.15 MIL/uL — ABNORMAL LOW (ref 4.22–5.81)
RDW: 14.9 % (ref 11.5–15.5)
WBC: 9.3 K/uL (ref 4.0–10.5)
nRBC: 0 % (ref 0.0–0.2)

## 2023-03-16 LAB — GLUCOSE, CAPILLARY
Glucose-Capillary: 111 mg/dL — ABNORMAL HIGH (ref 70–99)
Glucose-Capillary: 127 mg/dL — ABNORMAL HIGH (ref 70–99)

## 2023-03-16 MED ORDER — POTASSIUM CHLORIDE 20 MEQ PO PACK
40.0000 meq | PACK | Freq: Once | ORAL | Status: AC
  Administered 2023-03-16: 40 meq via ORAL
  Filled 2023-03-16: qty 2

## 2023-03-16 MED ORDER — ALLOPURINOL 100 MG PO TABS
100.0000 mg | ORAL_TABLET | Freq: Every day | ORAL | 0 refills | Status: DC
  Filled 2023-03-16: qty 30, 30d supply, fill #0

## 2023-03-16 MED ORDER — FUROSEMIDE 40 MG PO TABS
40.0000 mg | ORAL_TABLET | Freq: Every day | ORAL | 0 refills | Status: DC
  Filled 2023-03-16: qty 90, 90d supply, fill #0

## 2023-03-16 MED ORDER — FUROSEMIDE 40 MG PO TABS
40.0000 mg | ORAL_TABLET | Freq: Every day | ORAL | 3 refills | Status: DC
  Filled 2023-03-16: qty 90, 90d supply, fill #0

## 2023-03-16 MED ORDER — COLCHICINE 0.6 MG PO TABS
0.6000 mg | ORAL_TABLET | Freq: Every day | ORAL | 0 refills | Status: DC
  Filled 2023-03-16: qty 4, 4d supply, fill #0

## 2023-03-16 NOTE — Discharge Summary (Addendum)
Family Medicine Teaching [facility] Discharge Summary  Patient name: Russell Collier Medical record number: [id number] Date of birth: 08-17-1960 Age: 63 y.o. Gender: male Date of Admission: 03/12/2023  Date of Discharge: 03/16/2023 Admitting Physician: Rometta Emery, MD  Primary Care Provider: Jerre Simon, MD Consultants: N/a  Indication for Hospitalization: Gout  Discharge Diagnoses/Problem List:  Principal Problem for Admission: Gout Other Problems addressed during stay:  Principal Problem:   Gout Active Problems:   Essential hypertension   Neuropathy   Chronic congestive heart failure  Familial TTR Cardiac Amyloidosis   Tobacco abuse   Hyperlipidemia   CAD (coronary artery disease)   Morbid obesity (HCC)   OSA on CPAP   Acute foot pain   Morbid obesity with BMI of 40.0-44.9, adult (HCC)   Type 2 diabetes mellitus without complication, without long-term current use of insulin (HCC)   Foot pain, bilateral   Brief Hospital Course:  Russell Collier is a 63 y.o.male with a history of gout, T2DM, CAD, PE on eliquis, CHF, amyloidosis who was admitted to the [facility] Teaching Service at [facility] for left foot swelling and numbness. His hospital course is detailed below:  Gout Flare Patient admitted for gout flare of bilateral feet preventing ambulation. L Foot XR with diffuse soft tissue swelling and no evidence of osteomyelitis. R foot XR without fracture or osteomyelitis. Uric acid 8.0. Patient was initially started on antibiotics for concern for cellulitis, discontinued abx after 1 day as procalcitonin <0.10. Initially held home Lasix due to gout flare, resumed by time of admission. Patient received colchicine and prednisone with improvement of pain symptoms. Started on daily allopurinol. By time of discharge, patient able to ambulate with walker. Patient discharged with colchicine. Patient to follow up with outpatient PT.   CHF  Familial Amyloidosis  Last Echo 50-55% G1DD in Aug  2024. Follows closely with Dr. Gala Romney. Initially held Lasix due to gout flare, resumed by time of admission. Continued all other home meds during admission.   T2DM A1c 6.0. Home meds of Zepbound weekly (has not started), farxiga 10 mg daily. Marcelline Deist resumed during admission with rSSI. CBGs remained stable throughout admission.  HTN  Home medications of enalapril and lasix. Held enalapril during admission, resumed on discharge. Reduced lasix to daily instead of BID during admission. BPs remained stable throughout admission.   Other chronic conditions were medically managed with home medications and formulary alternatives as necessary (OSA, HLD, Hx PE, PAD, HSV, GERD, COPD)  PCP Follow-up Recommendations: Follow up CPAP compliance Consider switching to losartan for BP control given possible lowering effect on uric acid Follow up on BMP Switched lasix to daily, please reassess need for Lasix. Follow-up constipation Follow-up L shoulder pain - reassuring physical exam during admission. 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   Disposition: Home with [facility]  Discharge Condition: Improved and stable  Discharge Exam:  Vitals:   03/16/23 0732 03/16/23 0835  BP: 122/68   Pulse: 62   Resp: 18   Temp: 97.7 F (36.5 C)   SpO2: 100% 100%   General: Well-appearing. Resting comfortably in room. CV: Normal S1/S2. No extra heart sounds. Warm and well-perfused. Pulm: Breathing comfortably on room air. CTAB. No increased WOB. Abd: Soft, non-tender, non-distended. Skin/Ext:  Warm, dry. Improving bilateral feet swelling, without erythema. Normal cap refill.  Psych: Pleasant and appropriate.   Significant Procedures: N/a  Significant Labs and Imaging:  Recent Labs  Lab 03/15/23 0602 03/16/23 0554  WBC 10.7* 9.3  HGB 11.7* 12.4*  HCT 34.6* 37.0*  PLT 210 254   Recent Labs  Lab 03/15/23 0602 03/16/23 0554  NA 134* 134*  K 4.2 3.7  CL 103 100  CO2 25 25  GLUCOSE 106*  110*  BUN 23 26*  CREATININE 1.15 1.27*  CALCIUM 8.6* 8.8*   Uric acid: 8.0 Procal negative CRP: 10.2 Sed rate: 32 ANA negative Rheumatoid factor 13.4  DG Foot L 1/18: Diffuse soft tissue swelling consistent with given history of cellulitis. No acute or destructive bony abnormalities. No evidence of osteomyelitis. Stable midfoot osteoarthritis. DG Foot R 1/19: No fracture, significant joint abnormality or evidence of osteomyelitis.  Results/Tests Pending at Time of Discharge: N/a  Discharge Medications:  Allergies as of 03/16/2023       Reactions   Chantix [varenicline] Anaphylaxis, Swelling, Other (See Comments)   Patient reports laryngospasm stopped in the ER, throat swelling   Bupropion Other (See Comments)   Headache - moderate/severe - self discontinued agent    Bactrim [sulfamethoxazole-trimethoprim] Rash   Rash and hives   Nicoderm [nicotine] Rash   Patch        Medication List     STOP taking these medications    ibuprofen 200 MG tablet Commonly known as: ADVIL   tirzepatide 2.5 MG/0.5ML injection vial Commonly known as: ZEPBOUND       TAKE these medications    acetaminophen 325 MG tablet Commonly known as: TYLENOL Take 2 tablets (650 mg total) by mouth every 6 (six) hours as needed for mild pain or moderate pain.   albuterol (2.5 MG/3ML) 0.083% nebulizer solution Commonly known as: PROVENTIL Take 3 mLs (2.5 mg total) by nebulization every 6 (six) hours as needed for wheezing or shortness of breath.   Ventolin HFA 108 (90 Base) MCG/ACT inhaler Generic drug: albuterol Inhale 2 puffs into the lungs every 6 (six) hours as needed for wheezing or shortness of breath.   allopurinol 100 MG tablet Commonly known as: ZYLOPRIM Take 1 tablet (100 mg total) by mouth daily.   Amvuttra 25 MG/0.5ML syringe Generic drug: vutrisiran sodium Inject 0.5 mLs (25 mg total) into the skin every 3 (three) months.   Anoro Ellipta 62.5-25 MCG/ACT Aepb Generic drug:  umeclidinium-vilanterol Inhale 1 puff into the lungs daily.   apixaban 2.5 MG Tabs tablet Commonly known as: Eliquis Take 1 tablet (2.5 mg total) by mouth 2 (two) times daily.   betamethasone valerate ointment 0.1 % Commonly known as: VALISONE Apply 1 Application topically 2 (two) times daily.   Blood Glucose Monitoring Suppl Devi 1 each by Does not apply route in the morning, at noon, and at bedtime. May substitute to any manufacturer covered by patient's insurance.   clopidogrel 75 MG tablet Commonly known as: PLAVIX Take 1 tablet (75 mg total) by mouth daily.   colchicine 0.6 MG tablet Take 1 tablet (0.6 mg total) by mouth daily for 4 days.   dapagliflozin propanediol 10 MG Tabs tablet Commonly known as: Farxiga Take 1 tablet (10 mg total) by mouth daily before breakfast.   enalapril 10 MG tablet Commonly known as: VASOTEC Take 1 tablet (10 mg total) by mouth at bedtime. What changed: when to take this   fluticasone 50 MCG/ACT nasal spray Commonly known as: FLONASE Place 1 spray into both nostrils daily.   furosemide 40 MG tablet Commonly known as: LASIX Take 1 tablet (40 mg total) by mouth daily. What changed: when to take this   lidocaine 5 % Commonly known as:  Lidoderm Place 1 patch onto the skin daily. Remove & Discard patch within 12 hours or as directed by MD   multivitamin Tabs tablet Take 1 tablet by mouth daily with breakfast.   pantoprazole 40 MG tablet Commonly known as: PROTONIX Take 1 tablet (40 mg total) by mouth daily.   potassium chloride SA 20 MEQ tablet Commonly known as: KLOR-CON M Take 2 tablets (40 mEq total) by mouth daily.   pregabalin 100 MG capsule Commonly known as: LYRICA Take 1 capsule (100 mg total) by mouth 3 (three) times daily.   rosuvastatin 10 MG tablet Commonly known as: CRESTOR Take 1 tablet (10 mg total) by mouth daily.   triamcinolone ointment 0.5 % Commonly known as: KENALOG Apply 1 Application topically 2 (two)  times daily.   valACYclovir 500 MG tablet Commonly known as: VALTREX Take 1 tablet (500 mg total) by mouth daily.   Vitamin A 2400 MCG (8000 UT) Tabs Take 1 tablet by mouth daily.   Vyndamax 61 MG Caps Generic drug: Tafamidis TAKE 1 CAPSULE BY MOUTH DAILY.               Durable Medical Equipment  (From admission, onward)           Start     Ordered   03/16/23 1148  For home use only DME Walker rolling  Once       Question Answer Comment  Walker: With 5 Inch Wheels   Patient needs a walker to treat with the following condition Gait difficulty      03/16/23 1147   03/16/23 1131  For home use only DME Walker rolling  Once       Question Answer Comment  Walker: With 5 Inch Wheels   Patient needs a walker to treat with the following condition Weakness      03/16/23 1131   03/15/23 1246  For home use only DME 3 n 1  Once        03/15/23 1245   03/15/23 1243  For home use only DME 3 n 1  Once        03/15/23 1243            Discharge Instructions: Please refer to Patient Instructions section of EMR for full details.  Patient was counseled important signs and symptoms that should prompt return to medical care, changes in medications, dietary instructions, activity restrictions, and follow up appointments.   Follow-Up Appointments:  Follow-up Information     [name] Follow up.   Specialty: Rehabilitation Why: Outpatient physical therapy. Office will call to follow up after hospital discharge. Contact information: [address] [place] [address] [phone]        Jerre Simon, MD Follow up on 03/18/2023.   Specialty: Family Medicine Why: 1:30 PM Contact information: [address] [place] [address] [phone]                 Ivery Quale, MD 03/16/2023, 4:14 PM PGY-1, [place] [facility]  Upper Level Addendum: Reviewed the above note, making  necessary revisions as appropriate. I agree with the medical decision making and physical exam as noted above. Tiffany Kocher, DO PGY-2 [facility] Family Medicine Residency

## 2023-03-16 NOTE — Progress Notes (Signed)
Discharge instructions given to pt. Pt verbalized understanding of all teaching and had no further questions. 

## 2023-03-16 NOTE — Progress Notes (Signed)
Specialty Pharmacy Refill Coordination Note  Russell Collier is a 63 y.o. male contacted today regarding refills of specialty medication(s) Tafamidis   Patient requested Delivery   Delivery date: 03/24/23   Verified address: [address] [place]   Medication will be filled on 03/23/23.

## 2023-03-16 NOTE — TOC Progression Note (Addendum)
Transition of Care (TOC) - Progression Note   MD secure chatted NCM patient wants a walker for home. NCM placed order . Ordered walker with Mitch with [employer]   he received the bedside commode yesterday , and a Rollator 2 years ago so insurance will not cover a walker  Patient Details  Name: Russell Collier MRN: [id number] Date of Birth: [birth year]  Transition of Care [facility]) CM/SW Contact  Ashanty Coltrane, Adria Devon, RN Phone Number: 03/16/2023, 11:33 AM  Clinical Narrative:       Expected Discharge Plan: OP Rehab Barriers to Discharge: Continued Medical Work up, Other (must enter comment) (Needs OP services)  Expected Discharge Plan and Services   Discharge Planning Services: CM Consult Post Acute Care Choice: Resumption of Svcs/PTA Provider Living arrangements for the past 2 months: Single Family Home Expected Discharge Date: 03/16/23                                     Social Determinants of Health (SDOH) Interventions SDOH Screenings   Food Insecurity: No Food Insecurity (03/13/2023)  Housing: Low Risk  (03/13/2023)  Transportation Needs: No Transportation Needs (03/13/2023)  Utilities: Not At Risk (03/13/2023)  Alcohol Screen: Low Risk  (11/14/2020)  Depression (PHQ2-9): Low Risk  (10/18/2022)  Financial Resource Strain: High Risk (11/14/2020)  Physical Activity: Insufficiently Active (05/11/2021)  Social Connections: Moderately Isolated (10/16/2018)  Stress: No Stress Concern Present (10/16/2018)  Tobacco Use: High Risk (03/12/2023)    Readmission Risk Interventions     No data to display

## 2023-03-16 NOTE — Progress Notes (Signed)
Mobility Specialist Progress Note:   03/16/23 1034  Mobility  Activity Ambulated independently in hallway  Level of Assistance Independent  Assistive Device Front wheel walker  Distance Ambulated (ft) 300 ft  Activity Response Tolerated well  Mobility Referral Yes  Mobility visit 1 Mobility  Mobility Specialist Start Time (ACUTE ONLY) 1000  Mobility Specialist Stop Time (ACUTE ONLY) 1020  Mobility Specialist Time Calculation (min) (ACUTE ONLY) 20 min   Pt received ambulating in hallway, agreeable for MS to join. C/o R foot pain during ambulation, otherwise asx throughout. Pt left EOB with all needs met.   Leory Plowman  Mobility Specialist Please contact via [employer] office at [phone]

## 2023-03-16 NOTE — Assessment & Plan Note (Deleted)
History of cardiac amyloidosis. Last Echo 50-55% G1DD. Follows closely with Dr. Gala Romney.  -States got Amvuttra injection Tuesday -Continue Vyndamax 61 mg daily -Continue Farxiga 10 mg daily -Resume home lasix 40 BID, monitor K *** small bump in Cr, repleted K

## 2023-03-16 NOTE — Assessment & Plan Note (Deleted)
Has hx of gout. L Foot XR with diffuse soft tissue swelling and no evidence of osteomyelitis. Uric acid 8.0. D/c'ed antibiotics which were initially started for concern for cellulitis. Bcx NG3d. Patient remains afebrile. Pain improving, able to ambulate with walker today. Reported L shoulder pain as well today, suspect aspect of gout given current flare episode vs arthritis given hx of shoulder surgery. -Continue colchicine 0.6 daily today (end 1/23) -Start allopurinol 100 daily  -Tylenol 1g q6 scheduled - D/c dilaudid, add oxycodone 5 q6 prn  -Conitnue home lyrica 100 TID -Continue prednisone 40 daily (1/20-1/22)  -Consider shoulder imaging if pain persists  -PT/OT eval and treat -AM CBC, CMP

## 2023-03-16 NOTE — Assessment & Plan Note (Deleted)
A1c 6.0. Home meds of Zepbound weekly (has not started), farxiga 10 mg daily. CBGs stable. -Continue farxiga 10 mg daily -rSSI with bedtime coverage

## 2023-03-16 NOTE — Progress Notes (Signed)
FMTS ATTENDING ADMISSION NOTE Margery Szostak,MD  The patient endorsed significant improvement of his B/L foot pain today. He used his bedside walker to ambulate with mild pain of his left shoulder. He has hx of DJD of his left shoulder s/p arthrocopic surgery. He is also concerned about his constipation.   Exam: Gen: No distress Heart: S1 S2 normal, no murmurs. RRR. Lungs: CTA B/L Abd: Soft. NT/ND, BS+ and normal. Ext: No leg or feet swelling. No LL erythema. 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

## 2023-03-16 NOTE — Progress Notes (Signed)
AVS reprinted

## 2023-03-17 ENCOUNTER — Telehealth: Payer: Self-pay

## 2023-03-17 LAB — CULTURE, BLOOD (ROUTINE X 2)
Culture: NO GROWTH
Culture: NO GROWTH
Special Requests: ADEQUATE

## 2023-03-17 NOTE — Telephone Encounter (Signed)
Received call from Drew with [employer].   She reports he is requesting transportation for apt tomorrow at our office.   She reports they require a 2 business day notice unless the apt is considered urgent.   Advised he was recently seen in the hospital and we would need to see him tomorrow.   Patient transportation scheduled for apt tomorrow.

## 2023-03-18 ENCOUNTER — Telehealth: Payer: Self-pay

## 2023-03-18 ENCOUNTER — Ambulatory Visit (INDEPENDENT_AMBULATORY_CARE_PROVIDER_SITE_OTHER): Payer: Medicaid Other | Admitting: Student

## 2023-03-18 ENCOUNTER — Encounter: Payer: Self-pay | Admitting: Student

## 2023-03-18 VITALS — BP 145/82 | HR 85 | Ht 70.0 in | Wt 277.8 lb

## 2023-03-18 DIAGNOSIS — N179 Acute kidney failure, unspecified: Secondary | ICD-10-CM

## 2023-03-18 DIAGNOSIS — I10 Essential (primary) hypertension: Secondary | ICD-10-CM | POA: Diagnosis not present

## 2023-03-18 DIAGNOSIS — M109 Gout, unspecified: Secondary | ICD-10-CM | POA: Diagnosis not present

## 2023-03-18 NOTE — Assessment & Plan Note (Signed)
BP today is elevated.  Patient reports home BP is usually well-controlled suspect his BPs are most likely due to pain.  Also he missed his morning dose today. -Encourage patient to take his medications as prescribed -Continue home BP checks -If home BPs elevated patient will return to reassess need for medication adjustment.

## 2023-03-18 NOTE — Progress Notes (Signed)
    SUBJECTIVE:   CHIEF COMPLAINT / HPI:   63 year old male with history of gout Presenting today for hospital follow-up Was treated with colchicine for gout Comparison send currently on colchicine. Of note patient is on Lasix for his heart failure managed by cardiology. Today patient reports improvement in pain with better ambulation.  PERTINENT  PMH / PSH: Reviewed  OBJECTIVE:   BP (!) 145/82   Pulse 85   Ht 5\' 10"  (1.778 m)   Wt 277 lb 12.8 oz (126 kg)   SpO2 100%   BMI 39.86 kg/m    Physical Exam General: Alert, well appearing, NAD Cardiovascular: RRR, No Murmurs, Normal S2/S2 Respiratory: CTAB, No wheezing or Rales Abdomen: No distension or tenderness Extremities: No edema on extremities   Skin: Warm and dry  ASSESSMENT/PLAN:   Gout Symptoms improving with colchicine and recently started on allopurinol. He is currently on Lasix and ACE-I which increases his risk of gout flareup.  However given patient's heart failure occasions and essentials.  I agree that patient will need to be on lactam acid lowering medication given recent hospitalization for gout flareup. -Complete colchicine course -Continue long-term therapy with allopurinol -Could consider checking uric acid level in 6 months.  AKI (acute kidney injury) (HCC) Most recent BMP shows elevated creatinine of 1.40.  Could be medication induced given restarting of Lasix, recent colchicine and allopurinol initiation.  -Ordered MP to trend creatinine -Encourage adequate hydration -Discussed avoiding NSAIDs  Essential hypertension BP today is elevated.  Patient reports home BP is usually well-controlled suspect his BPs are most likely due to pain.  Also he missed his morning dose today. -Encourage patient to take his medications as prescribed -Continue home BP checks -If home BPs elevated patient will return to reassess need for medication adjustment.     Jerre Simon, MD [facility]

## 2023-03-18 NOTE — Assessment & Plan Note (Signed)
Most recent BMP shows elevated creatinine of 1.40.  Could be medication induced given restarting of Lasix, recent colchicine and allopurinol initiation.  -Ordered MP to trend creatinine -Encourage adequate hydration -Discussed avoiding NSAIDs

## 2023-03-18 NOTE — Patient Instructions (Signed)
Pleasure to meet you today.  Glad to hear that you the pain and your fluid has since improved.  Please continue to take your medications as prescribed.  You will continue to take allopurinol for extended period of time and possibly lifetime.  Please referral to physical therapy to be sent to the [facility] [place].  Today I put an order to check your kidney function given your last labs from the hospital.

## 2023-03-18 NOTE — Telephone Encounter (Signed)
Faxed PCS forms to number provided on document.   Copy made and placed in batch scanning.   Veronda Prude, RN

## 2023-03-18 NOTE — Telephone Encounter (Signed)
Sue Lush with [facility] calls nurse line in regards to personal care services.   She reports she is faxing over [facility] form for PCP signature.   Please let me know if you do not receive this.

## 2023-03-18 NOTE — Assessment & Plan Note (Addendum)
Symptoms improving with colchicine and recently started on allopurinol. He is currently on Lasix and ACE-I which increases his risk of gout flareup.  However given patient's heart failure occasions and essentials.  I agree that patient will need to be on lactam acid lowering medication given recent hospitalization for gout flareup. -Complete colchicine course -Continue long-term therapy with allopurinol -Could consider checking uric acid level in 6 months.

## 2023-03-19 LAB — BASIC METABOLIC PANEL
BUN/Creatinine Ratio: 22 (ref 10–24)
BUN: 29 mg/dL — ABNORMAL HIGH (ref 8–27)
CO2: 23 mmol/L (ref 20–29)
Calcium: 9.8 mg/dL (ref 8.6–10.2)
Chloride: 99 mmol/L (ref 96–106)
Creatinine, Ser: 1.32 mg/dL — ABNORMAL HIGH (ref 0.76–1.27)
Glucose: 119 mg/dL — ABNORMAL HIGH (ref 70–99)
Potassium: 4.5 mmol/L (ref 3.5–5.2)
Sodium: 142 mmol/L (ref 134–144)
eGFR: 60 mL/min/{1.73_m2} (ref >59)

## 2023-03-22 NOTE — Telephone Encounter (Signed)
Office notes faxed

## 2023-03-23 ENCOUNTER — Other Ambulatory Visit: Payer: Self-pay

## 2023-03-24 ENCOUNTER — Ambulatory Visit: Payer: Medicaid Other | Admitting: Occupational Therapy

## 2023-03-24 ENCOUNTER — Ambulatory Visit: Payer: Medicaid Other | Admitting: Physical Therapy

## 2023-03-24 ENCOUNTER — Telehealth: Payer: Self-pay

## 2023-03-24 NOTE — Telephone Encounter (Signed)
Pharmacy Patient Advocate Encounter   Received notification from CoverMyMeds that prior authorization for ZEPBOUND is required/requested.   Insurance verification completed.   The patient is insured through [facility] MEDICAID .   PA required; PA submitted to above mentioned insurance via CoverMyMeds Key/confirmation #/[id number] [employer]. Status is pending

## 2023-03-25 NOTE — Telephone Encounter (Signed)
Pharmacy Patient Advocate Encounter  Received notification from [facility] MEDICAID that Prior Authorization for ZEPBOUND has been DENIED.  Full denial letter will be uploaded to the media tab. See denial reason below.  Per your health plan's criteria, this drug is covered if you meet the following:   (1) The drug is used to manage your weight.  (2) Your doctor submits medical records (for example, chart notes, assessments, treatment plans, monitoring plans) confirming you are currently on and will continue lifestyle changes (including structured nutrition and physical activity, unless physical activity is not clinically appropriate at the time the requested therapy commences).   (3) One of the following: (A) You have tried for at least three months, a preferred drug: The preferred drugs: Wegovy. (B) You cannot use the preferred drugs.  PA #/Case ID/Reference #: [id number]

## 2023-03-29 ENCOUNTER — Telehealth: Payer: Self-pay

## 2023-03-29 DIAGNOSIS — I10 Essential (primary) hypertension: Secondary | ICD-10-CM

## 2023-03-29 DIAGNOSIS — E1151 Type 2 diabetes mellitus with diabetic peripheral angiopathy without gangrene: Secondary | ICD-10-CM

## 2023-03-30 ENCOUNTER — Ambulatory Visit (INDEPENDENT_AMBULATORY_CARE_PROVIDER_SITE_OTHER): Payer: Medicaid Other | Admitting: Student

## 2023-03-30 ENCOUNTER — Encounter: Payer: Self-pay | Admitting: Student

## 2023-03-30 VITALS — BP 116/65 | HR 67 | Ht 70.0 in | Wt 276.4 lb

## 2023-03-30 DIAGNOSIS — B009 Herpesviral infection, unspecified: Secondary | ICD-10-CM

## 2023-03-30 DIAGNOSIS — G629 Polyneuropathy, unspecified: Secondary | ICD-10-CM

## 2023-03-30 DIAGNOSIS — G609 Hereditary and idiopathic neuropathy, unspecified: Secondary | ICD-10-CM

## 2023-03-30 MED ORDER — VALACYCLOVIR HCL 500 MG PO TABS: 500.0000 mg | ORAL_TABLET | Freq: Every day | ORAL | 11 refills | Status: AC

## 2023-03-30 NOTE — Assessment & Plan Note (Signed)
 Stable on chronic Valtrex  therapy.  No outbreak at this time. -Refilled Valtrex  500 mg daily.

## 2023-03-30 NOTE — Patient Instructions (Signed)
 Your to meet you today.  Today I refilled your Valtrex .  Also we just received your form from your insurance today.  I will make sure to complete that by end of the day and also include the notes from today to support your request.

## 2023-03-30 NOTE — Progress Notes (Signed)
    SUBJECTIVE:   CHIEF COMPLAINT / HPI: Medication refill  63 year old male on long-term Valtrex  presenting today for medication refill.  Patient reports his Valtrex  has not been refilled or refill has been denied by his insurance.  Mostly covered for 14 days at a time.  Has not had a breakout in a long time and currently asymptomatic.  Per his desire to have his medication refilled as he is wanting to avoid any outbreaks.  Difficulty with ambulation Patient with history of idiopathic peripheral neuropathy, amlodipine  neuropathy and severe gout presenting today requesting increase in time for PCS.  Recently hospitalized due to severe gout flareup on his foot bilaterally which has significantly impacted his ambulation. Currently walking with cain for support to avoid falls.   PERTINENT  PMH / PSH: Reviewed   OBJECTIVE:   BP 116/65   Pulse 67   Ht 5' 10 (1.778 m)   Wt 276 lb 6.4 oz (125.4 kg)   SpO2 98%   BMI 39.66 kg/m    Physical Exam General: Alert, well appearing, NAD Cardiovascular: RRR, No Murmurs, Normal S2/S2 Respiratory: CTAB, No wheezing or Rales Abdomen: No distension or tenderness Extremities: No edema on extremities  Neuro: Limping gait and walking with cain   ASSESSMENT/PLAN:   Idiopathic peripheral neuropathy Known history of chronic neuropathy affecting his ambulation.  Multiple risk factors including amyloidosis, and diabetes.  Has had issues with ambulation that has affected his ADLs however worsened in the last few weeks due to gout flareup on his foots bilaterally requiring hospitalization.  Given his impaired ambulation, he will most likely benefit from increasing his PCS hours to help with his ADLs and also reduce patient's risk of falls. -PCS form completed and to be faxed to insurance company.  HSV (herpes simplex virus) infection Stable on chronic Valtrex  therapy.  No outbreak at this time. -Refilled Valtrex  500 mg daily.     Norleen April, MD [facility]  Health [facility]

## 2023-03-30 NOTE — Assessment & Plan Note (Signed)
 Known history of chronic neuropathy affecting his ambulation.  Multiple risk factors including amyloidosis, and diabetes.  Has had issues with ambulation that has affected his ADLs however worsened in the last few weeks due to gout flareup on his foots bilaterally requiring hospitalization.  Given his impaired ambulation, he will most likely benefit from increasing his PCS hours to help with his ADLs and also reduce patient's risk of falls. -PCS form completed and to be faxed to insurance company.

## 2023-04-01 ENCOUNTER — Other Ambulatory Visit (HOSPITAL_COMMUNITY): Payer: Self-pay

## 2023-04-04 ENCOUNTER — Other Ambulatory Visit: Payer: Self-pay | Admitting: *Deleted

## 2023-04-04 NOTE — Patient Outreach (Signed)
 Medicaid Managed Care   Nurse Care Manager Note  04/04/2023 Name:  Russell Collier MRN:  [id number] DOB:  04/18/1960  Russell Collier is an 63 y.o. year old male who is a primary patient of Goble Last, MD.  The [facility] Managed Care Coordination team was consulted for assistance with:    Limited Mobility  Russell Collier was given information about Medicaid Managed Care Coordination team services today. [name] Patient agreed to services and verbal consent obtained.  Engaged with patient by telephone for initial visit in response to provider referral for case management and/or care coordination services.   Patient is participating in a Managed Medicaid Plan:  Yes  Assessments/Interventions:  Review of past medical history, allergies, medications, health status, including review of consultants reports, laboratory and other test data, was performed as part of comprehensive evaluation and provision of chronic care management services.  SDOH (Social Drivers of Health) assessments and interventions performed: SDOH Interventions    Flowsheet Row Patient Outreach Telephone from 04/04/2023 in [place] POPULATION HEALTH DEPARTMENT Telephone from 08/04/2022 in [place] POPULATION HEALTH DEPARTMENT Refill from 04/27/2022 in [facility] Care Coordination Patient Outreach Telephone from 08/10/2021 in [facility] Care Coordination Patient Outreach Telephone from 07/15/2021 in [facility] Care Coordination Patient Outreach Telephone from 05/11/2021 in [facility] Care Coordination  SDOH Interventions        Food Insecurity Interventions Intervention Not Indicated -- -- -- -- --  Housing Interventions Intervention Not Indicated -- -- -- -- --  Transportation Interventions -- Intervention Not Indicated SCAT Psychologist, clinical) Intervention Not Indicated  [Utilizing UHC transportation] Intervention  Not Indicated  [Utilizing UHC medical transportation] --  Utilities Interventions Intervention Not Indicated -- -- -- -- --  Physical Activity Interventions -- -- -- -- -- Intervention Not Indicated       Care Plan  Allergies  Allergen Reactions   Chantix  [Varenicline ] Anaphylaxis, Swelling and Other (See Comments)    Patient reports laryngospasm stopped in the ER, throat swelling   Bupropion  Other (See Comments)    Headache - moderate/severe - self discontinued agent    Bactrim  [Sulfamethoxazole -Trimethoprim ] Rash    Rash and hives   Nicoderm [Nicotine ] Rash    Patch    Medications Reviewed Today     Reviewed by [name], RN (Registered Nurse) on 04/04/23 at 1444  Med List Status: <None>   Medication Order Taking? Sig Documenting Provider Last Dose Status Informant  acetaminophen  (TYLENOL ) 325 MG tablet [id number] Yes Take 2 tablets (650 mg total) by mouth every 6 (six) hours as needed for mild pain or moderate pain. Dameron, Marisa, DO Taking Active Self  albuterol  (PROVENTIL ) (2.5 MG/3ML) 0.083% nebulizer solution [id number] Yes Take 3 mLs (2.5 mg total) by nebulization every 6 (six) hours as needed for wheezing or shortness of breath. Cresenzo, John V, MD Taking Active Self  allopurinol  (ZYLOPRIM ) 100 MG tablet [id number] Yes Take 1 tablet (100 mg total) by mouth daily. Lavada Porteous, DO Taking Active   apixaban  (ELIQUIS ) 2.5 MG TABS tablet [id number] Yes Take 1 tablet (2.5 mg total) by mouth 2 (two) times daily. [name], Rheta Celestine, MD Taking Active   betamethasone  valerate ointment (VALISONE ) 0.1 % [id number] Yes Apply 1 Application topically 2 (two) times daily. Dahbura, Anton, DO Taking Active   Blood Glucose Monitoring Suppl DEVI [id number] No 1 each by Does not apply route in the morning, at noon, and at bedtime. May substitute to any  manufacturer covered by [employer].  Patient not taking: Reported on 04/04/2023   Santana Cue, MD Not Taking Active             Med Note Neal Baldy, Emarion Toral A   Mon Apr 04, 2023  2:41 PM) Broken  clopidogrel  (PLAVIX ) 75 MG tablet [id number] Yes Take 1 tablet (75 mg total) by mouth daily. Veronia Goon, DO Taking Active   colchicine  0.6 MG tablet [id number]  Take 1 tablet (0.6 mg total) by mouth daily for 4 days. Lavada Porteous, DO  Expired 03/20/23 2359   dapagliflozin  propanediol (FARXIGA ) 10 MG TABS tablet [id number] Yes Take 1 tablet (10 mg total) by mouth daily before breakfast. [name], Rheta Celestine, MD Taking Active   enalapril  (VASOTEC ) 10 MG tablet [id number] Yes Take 1 tablet (10 mg total) by mouth at bedtime.  Patient taking differently: Take 10 mg by mouth daily.   [name], Rheta Celestine, MD Taking Active   fluticasone  (FLONASE ) 50 MCG/ACT nasal spray [id number] Yes Place 1 spray into both nostrils daily. Dahbura, Anton, DO Taking Active   furosemide  (LASIX ) 40 MG tablet [id number] Yes Take 1 tablet (40 mg total) by mouth daily. Lavada Porteous, DO Taking Active   lidocaine  (LIDODERM ) 5 % [id number] Yes Place 1 patch onto the skin daily. Remove & Discard patch within 12 hours or as directed by MD Maralee Senate, April, MD Taking Active   multivitamin (ONE-A-DAY MEN'S) TABS tablet [id number] Yes Take 1 tablet by mouth daily with breakfast. [provider] Taking Active Self  pantoprazole  (PROTONIX ) 40 MG tablet [id number] Yes Take 1 tablet (40 mg total) by mouth daily. Dahbura, Anton, DO Taking Active   potassium chloride  SA (KLOR-CON  M) 20 MEQ tablet [id number] Yes Take 2 tablets (40 mEq total) by mouth daily. [name], Rheta Celestine, MD Taking Active   pregabalin  (LYRICA ) 100 MG capsule [id number] Yes Take 1 capsule (100 mg total) by mouth 3 (three) times daily. Veronia Goon, DO Taking Active   rosuvastatin  (CRESTOR ) 10 MG tablet [id number] Yes Take 1 tablet (10 mg total) by mouth daily. Dahbura, Anton, DO Taking Active   Tafamidis  (VYNDAMAX ) 61 MG CAPS [id number] Yes TAKE 1 CAPSULE BY MOUTH DAILY. [name], Rheta Celestine, MD Taking  Active   triamcinolone  ointment (KENALOG ) 0.5 % [id number] Yes Apply 1 Application topically 2 (two) times daily. Dahbura, Anton, DO Taking Active   umeclidinium-vilanterol (ANORO ELLIPTA ) 62.5-25 MCG/ACT AEPB [id number] Yes Inhale 1 puff into the lungs daily. Dahbura, Anton, DO Taking Active   valACYclovir  (VALTREX ) 500 MG tablet [id number] Yes Take 1 tablet (500 mg total) by mouth daily. Goble Last, MD Taking Active   VENTOLIN  HFA 108 [phone] Base) MCG/ACT inhaler [id number] Yes Inhale 2 puffs into the lungs every 6 (six) hours as needed for wheezing or shortness of breath. Veronia Goon, DO Taking Active   Vitamin A  2400 MCG (8000 UT) TABS [id number] Yes Take 1 tablet by mouth daily. [name], Rheta Celestine, MD Taking Active Self           Med Note Guido Leeks, Sheryn Doom Jun 28, 2021  9:38 PM)    vutrisiran  sodium (AMVUTTRA ) 25 MG/0.5ML syringe [id number] Yes Inject 0.5 mLs (25 mg total) into the skin every 3 (three) months. [name], Rheta Celestine, MD Taking Active Self            Patient Active Problem List   Diagnosis Date Noted   Foot pain, bilateral 03/13/2023   Gout 03/12/2023   Right flank hematoma, subsequent encounter 01/04/2023  Acute pain of right thigh 08/13/2022   HSV (herpes simplex virus) infection 01/18/2022   Unspecified skin changes 08/21/2021   Pulmonary hypertension, primary (HCC)    Cellulitis 07/09/2021   Urinary tract infection without hematuria    Sepsis (HCC) 06/28/2021   Community acquired pneumonia    Gout 02/26/2021   Skin tag 02/12/2021   Skin cyst 02/12/2021   Anticoagulant long-term use 01/29/2021   Dermoid cyst of skin of nose 12/24/2020   Strain of right calf muscle 07/01/2020   Status post lumbar spinal fusion 06/06/2020   Impaired mobility and ADLs 04/23/2020   Midline low back pain 04/23/2020   Neuropathy, 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   COPD exacerbation (HCC) 03/20/2020   Type 2 diabetes mellitus with diabetic peripheral angiopathy without gangrene, without  long-term current use of insulin  (HCC) 03/20/2020   Coagulation defect (HCC) 12/11/2019   Fatigue 12/10/2019   Frequent epistaxis 10/30/2019   Disc degeneration, 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   Ganglion of right knee 09/27/2019   Lumbar radiculopathy 09/27/2019   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HCC)    Respiratory failure (HCC) 07/27/2019   Primary osteoarthritis of right knee 07/24/2019   Chronic pain 05/31/2019   AKI (acute kidney injury) (HCC) 05/31/2019   Acute foot pain 05/16/2019   Chronic knee pain 04/16/2019   Diabetes (HCC) 04/16/2019   Esophageal reflux 02/06/2019   Chronic skin ulcer of lower leg (HCC) 12/21/2018   S/P drug eluting coronary stent placement    Coronary artery disease involving native coronary artery of native heart with unstable angina pectoris (HCC) 12/20/2018   Unstable angina (HCC)    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   Acute on chronic diastolic heart failure (HCC)    Shortness of breath    Idiopathic peripheral neuropathy    Chronic diastolic heart failure (HCC)    Abnormal ankle brachial index (ABI)    History of cocaine abuse (HCC) 08/20/2018   Marijuana use 08/20/2018   Morbid obesity with BMI of 40.0-44.9, adult (HCC) 08/20/2018   Dyspepsia    Morbid obesity (HCC)    OSA on CPAP    CAD (coronary artery disease) 03/30/2018   Tobacco abuse 02/03/2018   Hyperlipidemia 02/03/2018   Acute respiratory failure with hypoxia (HCC) 02/02/2018   Chronic congestive heart failure  Familial TTR Cardiac Amyloidosis    Keratoconjunctivitis sicca of left eye not specified as Sjogren's 09/06/2017   Long term current use of oral hypoglycemic drug 09/06/2017   Mechanical ectropion of left lower eyelid 09/06/2017   Nuclear sclerotic cataract of both eyes 09/06/2017   Refractive amblyopia, left 09/06/2017   Type 2 diabetes mellitus without complication, without long-term current use of insulin  (HCC) 09/06/2017   Hyperopia of both eyes with astigmatism and presbyopia 09/06/2017    Essential hypertension    Neuropathy     Conditions to be addressed/monitored per PCP order:   Limited Mobility  Care Plan : RN Care Manager Plan of Care  Updates made by [name], RN since 04/04/2023 12:00 AM     Problem: Health Management needs related to Limited Mobility      Long-Range Goal: Development of Plan of Care to address Health Management needs related to Limited Mobility   Start Date: 04/04/2023  Expected End Date: 07/03/2023  Note:   Current Barriers:  Knowledge Deficits related to plan of care for management of Limited Mobility   RNCM Clinical Goal(s):  Patient will attend all scheduled medical appointments: 04/08/23 for PT evaluation as evidenced by provider documentation continue to work  with RN Care Manager to address care management and care coordination needs related to  Limited Mobility as evidenced by adherence to CM Team Scheduled appointments    Interventions: Evaluation of current treatment plan related to  self management and patient's adherence to plan as established by provider   Limited Mobility  (Status:  New goal.)  Long Term Goal Evaluation of current treatment plan related to  Limited Mobility ,  self-management and patient's adherence to plan as established by provider. Discussed plans with patient for ongoing care management follow up and provided patient with direct contact information for care management team Advised patient to follow up with [facility] [phone] regarding increasing PCS hours  Reviewed medications with patient and discussed patient taking as directed Reviewed scheduled/upcoming provider appointments including PT evaluation 05/06/23 and HF Clinic on 05/02/23 Assessed social determinant of health barriers Ensured patient has arranged transportation to upcoming appointment  Patient Goals/Self-Care Activities: Take all medications as prescribed Attend all scheduled provider appointments Call provider office for new concerns or  questions   Follow Up Plan:  Telephone follow up appointment with care management team member scheduled for:  05/04/23 at 10:30am      Follow Up:  Patient agrees to Care Plan and Follow-up.  Plan: The Managed Medicaid care management team will reach out to the patient again over the next 30 days.  Date/time of next scheduled RN care management/care coordination outreach:  05/04/23 at 10:30am  [name] RN, BSN [place]  [facility] RN Care Manager [phone]

## 2023-04-04 NOTE — Patient Instructions (Signed)
 Visit Information  Russell Collier was given information about Medicaid Managed Care team care coordination services as a part of their [facility] Community Plan Medicaid benefit. Russell Collier verbally consented to engagement with the [facility] Managed Care team.   If you are experiencing a medical emergency, please call 911 or report to your local emergency department or urgent care.   If you have a non-emergency medical problem during routine business hours, please contact your provider's office and ask to speak with a nurse.   For questions related to your [facility], please call: [phone] or visit the homepage here: [email or web address]  If you would like to schedule transportation through your [facility], please call the following number at least 2 days in advance of your appointment: [phone]   Rides for urgent appointments can also be made after hours by calling Member Services.  Call the Behavioral Health Crisis Line at [phone], at any time, 24 hours a day, 7 days a week. If you are in danger or need immediate medical attention call 911.  If you would like help to quit smoking, call 1-800-QUIT-NOW (806-824-7832) OR Espaol: 1-855-Djelo-Ya (2-725-366-4403) o para ms informacin haga clic aqu or Text READY to 474-259 to register via text  Russell Collier,   Please see education materials related to fall prevention provided as print materials.   The patient verbalized understanding of instructions, educational materials, and care plan provided today and agreed to receive a mailed copy of patient instructions, educational materials, and care plan.   Telephone follow up appointment with Managed Medicaid care management team member scheduled for:05/04/23 at 10:30am  [name] RN, BSN [place]  [facility] RN Care  Manager [phone]   Following is a copy of your plan of care:  Care Plan : RN Care Manager Plan of Care  Updates made by [name], RN since 04/04/2023 12:00 AM     Problem: Health Management needs related to Limited Mobility      Long-Range Goal: Development of Plan of Care to address Health Management needs related to Limited Mobility   Start Date: 04/04/2023  Expected End Date: 07/03/2023  Note:   Current Barriers:  Knowledge Deficits related to plan of care for management of Limited Mobility   RNCM Clinical Goal(s):  Patient will attend all scheduled medical appointments: 04/08/23 for PT evaluation as evidenced by provider documentation continue to work with RN Care Manager to address care management and care coordination needs related to  Limited Mobility as evidenced by adherence to CM Team Scheduled appointments    Interventions: Evaluation of current treatment plan related to  self management and patient's adherence to plan as established by provider   Limited Mobility  (Status:  New goal.)  Long Term Goal Evaluation of current treatment plan related to  Limited Mobility ,  self-management and patient's adherence to plan as established by provider. Discussed plans with patient for ongoing care management follow up and provided patient with direct contact information for care management team Advised patient to follow up with [facility] [phone] regarding increasing PCS hours  Reviewed medications with patient and discussed patient taking as directed Reviewed scheduled/upcoming provider appointments including PT evaluation 05/06/23 and HF Clinic on 05/02/23 Assessed social determinant of health barriers Ensured patient has arranged transportation to upcoming appointment  Patient Goals/Self-Care Activities: Take all medications as prescribed Attend all scheduled provider appointments Call provider office for new concerns or questions   Follow  Up Plan:  Telephone follow  up appointment with care management team member scheduled for:  05/04/23 at 10:30am

## 2023-04-07 ENCOUNTER — Telehealth: Payer: Self-pay | Admitting: Student

## 2023-04-07 NOTE — Telephone Encounter (Signed)
Patient dropped off form at front desk for Attestation of Medical Need.  Verified that patient section of form has been completed.  Last DOS/WCC with PCP was 03/30/23.  Placed form in green team folder to be completed by clinical staff.  Vilinda Blanks

## 2023-04-08 ENCOUNTER — Other Ambulatory Visit: Payer: Self-pay

## 2023-04-08 ENCOUNTER — Other Ambulatory Visit (HOSPITAL_BASED_OUTPATIENT_CLINIC_OR_DEPARTMENT_OTHER): Payer: Self-pay

## 2023-04-08 ENCOUNTER — Ambulatory Visit (HOSPITAL_BASED_OUTPATIENT_CLINIC_OR_DEPARTMENT_OTHER): Payer: Medicaid Other | Attending: Family Medicine | Admitting: Physical Therapy

## 2023-04-08 ENCOUNTER — Encounter (HOSPITAL_BASED_OUTPATIENT_CLINIC_OR_DEPARTMENT_OTHER): Payer: Self-pay | Admitting: Physical Therapy

## 2023-04-08 DIAGNOSIS — M109 Gout, unspecified: Secondary | ICD-10-CM | POA: Insufficient documentation

## 2023-04-08 DIAGNOSIS — M79672 Pain in left foot: Secondary | ICD-10-CM | POA: Insufficient documentation

## 2023-04-08 DIAGNOSIS — M79671 Pain in right foot: Secondary | ICD-10-CM | POA: Insufficient documentation

## 2023-04-08 DIAGNOSIS — R262 Difficulty in walking, not elsewhere classified: Secondary | ICD-10-CM | POA: Insufficient documentation
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
## 2023-04-11 NOTE — Telephone Encounter (Signed)
 Reviewed form and placed in PCP's box for completion.  Glennie Hawk, CMA

## 2023-04-14 ENCOUNTER — Encounter (HOSPITAL_BASED_OUTPATIENT_CLINIC_OR_DEPARTMENT_OTHER): Payer: Self-pay | Admitting: Physical Therapy

## 2023-04-14 ENCOUNTER — Ambulatory Visit (HOSPITAL_BASED_OUTPATIENT_CLINIC_OR_DEPARTMENT_OTHER): Payer: Medicaid Other | Admitting: Physical Therapy

## 2023-04-14 DIAGNOSIS — R262 Difficulty in walking, not elsewhere classified: Secondary | ICD-10-CM

## 2023-04-14 DIAGNOSIS — M79671 Pain in right foot: Secondary | ICD-10-CM | POA: Diagnosis not present
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
## 2023-04-15 ENCOUNTER — Other Ambulatory Visit: Payer: Self-pay

## 2023-04-15 ENCOUNTER — Encounter (HOSPITAL_COMMUNITY): Payer: Self-pay

## 2023-04-15 NOTE — Progress Notes (Signed)
Specialty Pharmacy Ongoing Clinical Assessment Note  Russell Collier is a 63 y.o. male who is being followed by the specialty pharmacy service for RxSp Cardiology   Patient's specialty medication(s) reviewed today: Tafamidis (Vyndamax)   Missed doses in the last 4 weeks: 0   Patient/Caregiver did not have any additional questions or concerns.   Therapeutic benefit summary: Unable to assess   Adverse events/side effects summary: No adverse events/side effects   Patient's therapy is appropriate to: Continue    Goals Addressed             This Visit's Progress    Stabilization of disease       Patient is on track. Patient will maintain adherence         Follow up:  6 months  Bobette Mo Specialty Pharmacist

## 2023-04-15 NOTE — Progress Notes (Signed)
Specialty Pharmacy Refill Coordination Note  Russell Collier is a 63 y.o. male contacted today regarding refills of specialty medication(s) Tafamidis Jeannie Fend)   Patient requested Delivery   Delivery date: 04/20/23   Verified address: [address] [place]   Medication will be filled on 04/19/23.

## 2023-04-18 ENCOUNTER — Encounter: Payer: Self-pay | Admitting: Student

## 2023-04-18 ENCOUNTER — Ambulatory Visit (INDEPENDENT_AMBULATORY_CARE_PROVIDER_SITE_OTHER): Payer: Medicaid Other | Admitting: Student

## 2023-04-18 ENCOUNTER — Other Ambulatory Visit: Payer: Self-pay | Admitting: Student

## 2023-04-18 VITALS — BP 136/80 | HR 94 | Wt 273.6 lb

## 2023-04-18 DIAGNOSIS — M109 Gout, unspecified: Secondary | ICD-10-CM

## 2023-04-18 MED ORDER — ALLOPURINOL 100 MG PO TABS: 100.0000 mg | ORAL_TABLET | Freq: Every day | ORAL | 0 refills | Status: DC

## 2023-04-18 MED ORDER — COLCHICINE 0.6 MG PO TABS
ORAL_TABLET | ORAL | 0 refills | Status: DC
Start: 2023-04-18 — End: 2023-06-03

## 2023-04-18 NOTE — Patient Instructions (Signed)
 Pleasure to meet you today.  Suspect your pain is due to the Gout flare up  I have sent in prescription for colchicine which you will take 2 tablets and then 1 tablets an hour later.  And then for the next 3 days you take 1 tablets daily.  Also I have sent in prescription for allopurinol which should help prevent more gout flareup over time.  You are supposed to take 100 mg daily.

## 2023-04-18 NOTE — Assessment & Plan Note (Addendum)
 Symptoms and exam findings consistent with gout flareup.  Patient is on diuretics for heart which increases his risk of gout flareup however does.  Patient has not been taking his prophylactic/maintenance dose of allopurinol 100 mg daily. -RX Colchiceine 1.2 mg followed by 0.6 mg in one hour and 3 days - Resent his allopurinol 100mg  daily for prophylaxis, taking after flareup resolves

## 2023-04-18 NOTE — Progress Notes (Signed)
    SUBJECTIVE:   CHIEF COMPLAINT / HPI:   63 year old male with history of gout presenting today for concerns of gout flareup.  Started having worsening pain 4 days ago in his right great toe.  Was previously treated with colchicine with relief of pain however has returned 4 days ago.  He is supposed to be on allopurinol 100 mg daily but patient stated he has not been taking this medication. He does appear confused on what meds he should be taking for his gout.   PERTINENT  PMH / PSH: Gout  OBJECTIVE:   BP 136/80   Pulse 94   Wt 273 lb 9.6 oz (124.1 kg)   SpO2 97%   BMI 39.26 kg/m    Physical Exam General: Alert, well appearing, NAD Cardiovascular: RRR, No Murmurs, Normal S2/S2 Respiratory: CTAB, No wheezing or Rales Abdomen: No distension or tenderness Extremities: Edematous right great toe with tenderness   ASSESSMENT/PLAN:   Gout Symptoms and exam findings consistent with gout flareup.  Patient is on diuretics for heart which increases his risk of gout flareup however does.  Patient has not been taking his prophylactic/maintenance dose of allopurinol 100 mg daily. -RX Colchiceine 1.2 mg followed by 0.6 mg in one hour and 3 days - Resent his allopurinol 100mg  daily for prophylaxis, taking after flareup resolves    Jerre Simon, MD [name]

## 2023-04-19 ENCOUNTER — Emergency Department (HOSPITAL_COMMUNITY)
Admission: EM | Admit: 2023-04-19 | Discharge: 2023-04-20 | Disposition: A | Discharge status: Home / Self Care | Payer: Medicaid Other | Attending: Emergency Medicine | Admitting: Emergency Medicine

## 2023-04-19 ENCOUNTER — Other Ambulatory Visit: Payer: Self-pay

## 2023-04-19 ENCOUNTER — Encounter (HOSPITAL_COMMUNITY): Payer: Self-pay

## 2023-04-19 ENCOUNTER — Emergency Department (HOSPITAL_COMMUNITY): Payer: Medicaid Other

## 2023-04-19 DIAGNOSIS — I110 Hypertensive heart disease with heart failure: Secondary | ICD-10-CM | POA: Diagnosis not present

## 2023-04-19 DIAGNOSIS — E119 Type 2 diabetes mellitus without complications: Secondary | ICD-10-CM | POA: Diagnosis not present

## 2023-04-19 DIAGNOSIS — H66003 Acute suppurative otitis media without spontaneous rupture of ear drum, bilateral: Secondary | ICD-10-CM | POA: Diagnosis not present

## 2023-04-19 DIAGNOSIS — I509 Heart failure, unspecified: Secondary | ICD-10-CM | POA: Diagnosis not present

## 2023-04-19 DIAGNOSIS — H9202 Otalgia, left ear: Secondary | ICD-10-CM | POA: Diagnosis present

## 2023-04-19 DIAGNOSIS — Z7901 Long term (current) use of anticoagulants: Secondary | ICD-10-CM | POA: Diagnosis not present

## 2023-04-19 DIAGNOSIS — I2510 Atherosclerotic heart disease of native coronary artery without angina pectoris: Secondary | ICD-10-CM | POA: Insufficient documentation

## 2023-04-19 LAB — BASIC METABOLIC PANEL
Anion gap: 11 (ref 5–15)
BUN: 19 mg/dL (ref 8–23)
CO2: 23 mmol/L (ref 22–32)
Calcium: 9.1 mg/dL (ref 8.9–10.3)
Chloride: 99 mmol/L (ref 98–111)
Creatinine, Ser: 1.34 mg/dL — ABNORMAL HIGH (ref 0.61–1.24)
GFR, Estimated: 59 mL/min — ABNORMAL LOW (ref >60)
Glucose, Bld: 136 mg/dL — ABNORMAL HIGH (ref 70–99)
Potassium: 4.3 mmol/L (ref 3.5–5.1)
Sodium: 133 mmol/L — ABNORMAL LOW (ref 135–145)

## 2023-04-19 LAB — CBC
HCT: 41.2 % (ref 39.0–52.0)
Hemoglobin: 13.9 g/dL (ref 13.0–17.0)
MCH: 29.6 pg (ref 26.0–34.0)
MCHC: 33.7 g/dL (ref 30.0–36.0)
MCV: 87.7 fL (ref 80.0–100.0)
Platelets: 247 10*3/uL (ref 150–400)
RBC: 4.7 MIL/uL (ref 4.22–5.81)
RDW: 15 % (ref 11.5–15.5)
WBC: 12.5 10*3/uL — ABNORMAL HIGH (ref 4.0–10.5)
nRBC: 0 % (ref 0.0–0.2)

## 2023-04-19 LAB — TROPONIN I (HIGH SENSITIVITY): Troponin I (High Sensitivity): 10 ng/L (ref <18)

## 2023-04-19 MED ORDER — ACETAMINOPHEN 500 MG PO TABS
1000.0000 mg | ORAL_TABLET | Freq: Once | ORAL | Status: AC
  Administered 2023-04-19: 1000 mg via ORAL
  Filled 2023-04-19: qty 2

## 2023-04-19 NOTE — ED Triage Notes (Signed)
 Pt BIB [facility] from home c/o left ear pain and a headache saw PCP yesterday with no diagnoses or new meds. Pt states he started having CP today.

## 2023-04-19 NOTE — ED Provider Triage Note (Signed)
 Emergency Medicine Provider Triage Evaluation Note  Russell Collier , a 63 y.o. male  was evaluated in triage.  Pt complains of left ear pain and headache seen by PCP. Chest pain today is new.   Review of Systems  Positive:  Negative:   Physical Exam  BP 119/72 (BP Location: Left Arm)   Pulse 80   Temp 98.6 F (37 C) (Oral)   Resp 18   SpO2 97%  Gen:   Awake, no distress   Resp:  Normal effort  MSK:   Moves extremities without difficulty  Other:    Medical Decision Making  Medically screening exam initiated at 9:43 PM.  Appropriate orders placed.  Russell Collier was informed that the remainder of the evaluation will be completed by another provider, this initial triage assessment does not replace that evaluation, and the importance of remaining in the ED until their evaluation is complete.     Glyn Ade, MD 04/19/23 2145

## 2023-04-20 LAB — RESP PANEL BY RT-PCR (RSV, FLU A&B, COVID)  RVPGX2
Influenza A by PCR: NEGATIVE
Influenza B by PCR: NEGATIVE
Resp Syncytial Virus by PCR: NEGATIVE
SARS Coronavirus 2 by RT PCR: NEGATIVE

## 2023-04-20 LAB — TROPONIN I (HIGH SENSITIVITY): Troponin I (High Sensitivity): 9 ng/L (ref <18)

## 2023-04-20 MED ORDER — AMOXICILLIN-POT CLAVULANATE 875-125 MG PO TABS
1.0000 | ORAL_TABLET | Freq: Two times a day (BID) | ORAL | 0 refills | Status: AC
Start: 2023-04-20 — End: 2023-04-25

## 2023-04-20 MED ORDER — AMOXICILLIN-POT CLAVULANATE 875-125 MG PO TABS
1.0000 | ORAL_TABLET | Freq: Once | ORAL | Status: AC
  Administered 2023-04-20: 1 via ORAL
  Filled 2023-04-20: qty 1

## 2023-04-20 NOTE — Discharge Instructions (Signed)
 Please follow-up with your primary care provider in regards to be symptoms and ER visit.  Today your exam shows you have bilateral ear infections and we gave your first dose of antibiotics however you may pick up the rest of the prescription.  Please take your medications as prescribed and symptoms change or worsen please return to the ER.

## 2023-04-20 NOTE — ED Provider Notes (Signed)
 [facility] AT [facility] Provider Note   CSN: [id number] Arrival date & time: 04/19/23  1958     History  Chief Complaint  Patient presents with   Chest Pain   Otalgia    Russell Collier is a 63 y.o. male history of cardiac amyloidosis, CAD, hyperlipidemia, CHF, diabetes presented for left ear pain for the past few days along with chest pain that occurred last night but that went away.  Patient states he is no longer concerned about his chest pain as it was a muscle spasm and has not had chest pain since then.  Patient is unable to further describe the chest pain.  Patient states that his left ear is hurting and he thinks he has an ear infection.  Patient denies any neck rigidity, altered mental status, fevers, facial or neck swelling.  Patient about eating drink without issue.    Home Medications Prior to Admission medications   Medication Sig Start Date End Date Taking? Authorizing Provider  amoxicillin-clavulanate (AUGMENTIN) 875-125 MG tablet Take 1 tablet by mouth every 12 (twelve) hours for 5 days. 04/20/23 04/25/23 Yes Wyllow Seigler, Beverly Gust, PA-C  acetaminophen (TYLENOL) 325 MG tablet Take 2 tablets (650 mg total) by mouth every 6 (six) hours as needed for mild pain or moderate pain. 07/13/21   Dameron, Nolberto Hanlon, DO  albuterol (PROVENTIL) (2.5 MG/3ML) 0.083% nebulizer solution Take 3 mLs (2.5 mg total) by nebulization every 6 (six) hours as needed for wheezing or shortness of breath. 11/20/20   Cresenzo, Cyndi Lennert, MD  allopurinol (ZYLOPRIM) 100 MG tablet TAKE 1 TABLET(100 MG) BY MOUTH DAILY 04/25/23   Jerre Simon, MD  apixaban (ELIQUIS) 2.5 MG TABS tablet Take 1 tablet (2.5 mg total) by mouth 2 (two) times daily. 01/17/23   [name], Bevelyn Buckles, MD  betamethasone valerate ointment (VALISONE) 0.1 % Apply 1 Application topically 2 (two) times daily. 02/28/23   Shelby Mattocks, DO  Blood Glucose Monitoring Suppl DEVI 1 each by Does not apply route in the morning, at noon,  and at bedtime. May substitute to any manufacturer covered by patient's insurance. Patient not taking: Reported on 04/04/2023 08/10/22   Valetta Close, MD  clopidogrel (PLAVIX) 75 MG tablet Take 1 tablet (75 mg total) by mouth daily. 02/28/23 02/28/24  Shelby Mattocks, DO  colchicine 0.6 MG tablet Take 2 tablets for 1.2 mg today, and 1 tablet one hour later. Then 0.6 mg daily for 3 days. 04/18/23   Jerre Simon, MD  dapagliflozin propanediol (FARXIGA) 10 MG TABS tablet Take 1 tablet (10 mg total) by mouth daily before breakfast. 01/17/23   [name], Bevelyn Buckles, MD  enalapril (VASOTEC) 10 MG tablet Take 1 tablet (10 mg total) by mouth at bedtime. Patient taking differently: Take 10 mg by mouth daily. 01/17/23   [name], Bevelyn Buckles, MD  fluticasone (FLONASE) 50 MCG/ACT nasal spray Place 1 spray into both nostrils daily. 02/28/23   Shelby Mattocks, DO  furosemide (LASIX) 40 MG tablet Take 1 tablet (40 mg total) by mouth daily. 03/16/23   Tiffany Kocher, DO  lidocaine (LIDODERM) 5 % Place 1 patch onto the skin daily. Remove & Discard patch within 12 hours or as directed by MD 12/01/21   Nicanor Alcon, April, MD  multivitamin (ONE-A-DAY MEN'S) TABS tablet Take 1 tablet by mouth daily with breakfast.    [provider]  pantoprazole (PROTONIX) 40 MG tablet Take 1 tablet (40 mg total) by mouth daily. 02/28/23   Shelby Mattocks, DO  potassium  chloride SA (KLOR-CON M) 20 MEQ tablet Take 2 tablets (40 mEq total) by mouth daily. 01/17/23   [name], Bevelyn Buckles, MD  pregabalin (LYRICA) 100 MG capsule Take 1 capsule (100 mg total) by mouth 3 (three) times daily. 02/28/23   Shelby Mattocks, DO  rosuvastatin (CRESTOR) 10 MG tablet Take 1 tablet (10 mg total) by mouth daily. 02/28/23   Shelby Mattocks, DO  Tafamidis (VYNDAMAX) 61 MG CAPS TAKE 1 CAPSULE BY MOUTH DAILY. 10/29/22 10/29/23  [name], Bevelyn Buckles, MD  triamcinolone ointment (KENALOG) 0.5 % Apply 1 Application topically 2 (two) times daily. 02/28/23   Shelby Mattocks, DO   umeclidinium-vilanterol (ANORO ELLIPTA) 62.5-25 MCG/ACT AEPB Inhale 1 puff into the lungs daily. 02/28/23   Shelby Mattocks, DO  valACYclovir (VALTREX) 500 MG tablet Take 1 tablet (500 mg total) by mouth daily. 03/30/23 09/14/23  Jerre Simon, MD  VENTOLIN HFA 108 [phone] Base) MCG/ACT inhaler Inhale 2 puffs into the lungs every 6 (six) hours as needed for wheezing or shortness of breath. 02/28/23   Shelby Mattocks, DO  Vitamin A 2400 MCG (8000 UT) TABS Take 1 tablet by mouth daily. 05/21/21   [name], Bevelyn Buckles, MD  vutrisiran sodium (AMVUTTRA) 25 MG/0.5ML syringe Inject 0.5 mLs (25 mg total) into the skin every 3 (three) months. 05/01/21   [name], Bevelyn Buckles, MD      Allergies    Chantix [varenicline], Bupropion, Bactrim [sulfamethoxazole-trimethoprim], and Nicoderm [nicotine]    Review of Systems   Review of Systems  HENT:  Positive for ear pain.   Cardiovascular:  Positive for chest pain.    Physical Exam Updated Vital Signs BP (!) 141/75 (BP Location: Right Arm)   Pulse 82   Temp 98.5 F (36.9 C) (Oral)   Resp 16   SpO2 98%  Physical Exam Constitutional:      General: He is not in acute distress. HENT:     Head: Normocephalic and atraumatic.     Jaw: There is normal jaw occlusion.     Salivary Glands: Right salivary gland is not diffusely enlarged or tender. Left salivary gland is not diffusely enlarged or tender.     Comments: No facial swelling    Right Ear: Hearing, ear canal and external ear normal.     Left Ear: Hearing, ear canal and external ear normal.     Ears:     Comments: Bulging erythematous bilateral tympanic membranes without perforation No mastoid fluctuance, skin color changes, warmth, tenderness    Nose: Nose normal.     Mouth/Throat:     Lips: Pink.     Mouth: Mucous membranes are moist.     Pharynx: Oropharynx is clear. Uvula midline.     Comments: No oral floor swelling Tolerating secretions No purulent drainage no No PTA noted No muffled voice  noted Eyes:     Extraocular Movements: Extraocular movements intact.     Conjunctiva/sclera: Conjunctivae normal.     Pupils: Pupils are equal, round, and reactive to light.  Neck:     Comments: No neck swelling Cardiovascular:     Rate and Rhythm: Normal rate and regular rhythm.     Pulses: Normal pulses.     Heart sounds: Normal heart sounds.  Pulmonary:     Effort: Pulmonary effort is normal. No respiratory distress.     Breath sounds: Normal breath sounds.  Musculoskeletal:     Cervical back: Normal range of motion and neck supple. No rigidity or tenderness.  Neurological:  Mental Status: He is alert.     ED Results / Procedures / Treatments   Labs (all labs ordered are listed, but only abnormal results are displayed) Labs Reviewed  BASIC METABOLIC PANEL - Abnormal; Notable for the following components:      Result Value   Sodium 133 (*)    Glucose, Bld 136 (*)    Creatinine, Ser 1.34 (*)    GFR, Estimated 59 (*)    All other components within normal limits  CBC - Abnormal; Notable for the following components:   WBC 12.5 (*)    All other components within normal limits  RESP PANEL BY RT-PCR (RSV, FLU A&B, COVID)  [id number]  TROPONIN I (HIGH SENSITIVITY)  TROPONIN I (HIGH SENSITIVITY)    EKG EKG Interpretation Date/Time:  Tuesday April 19 2023 20:48:32 EST Ventricular Rate:  83 PR Interval:  196 QRS Duration:  140 QT Interval:  396 QTC Calculation: 465 R Axis:   -14  Text Interpretation: Normal sinus rhythm Non-specific intra-ventricular conduction block Cannot rule out Septal infarct , age undetermined Abnormal ECG When compared with ECG of 12-Mar-2023 17:59, PREVIOUS ECG IS PRESENT STD in inferiorlateral leads similar to august ekg Confirmed by Coralee Pesa [phone]) on 04/19/2023 8:52:56 PM  Radiology DG Chest 2 View Result Date: 04/19/2023 CLINICAL DATA:  Chest pain EXAM: CHEST - 2 VIEW COMPARISON:  06/28/2021. FINDINGS: The heart size and mediastinal  contours are within normal limits. Both lungs are clear. The visualized skeletal structures are unremarkable. IMPRESSION: No active cardiopulmonary disease. Electronically Signed   By: Charlett Nose M.D.   On: 04/19/2023 22:25    Procedures Procedures    Medications Ordered in ED Medications  acetaminophen (TYLENOL) tablet 1,000 mg (1,000 mg Oral Given 04/19/23 2236)  amoxicillin-clavulanate (AUGMENTIN) 875-125 MG per tablet 1 tablet (1 tablet Oral Given 04/20/23 1122)    ED Course/ Medical Decision Making/ A&P                                 Medical Decision Making Amount and/or Complexity of Data Reviewed Labs: ordered. Radiology: ordered.  Risk Prescription drug management.   [name] 63 y.o. presented today for chest pain, otalgia. Working DDx that I considered at this time includes, but not limited to, AOM, mastoiditis, necrotizing otitis externa, otitis externa, meningitis, MSK, ACS, dissection, PE.  R/o DDx: mastoiditis, necrotizing otitis externa, otitis externa, meningitis, ACS, dissection, PE: These are considered less likely due to history of present illness, physical exam, labs/imaging findings  Review of prior external notes: 04/18/2023 office visit  Unique Tests and My Independent Interpretation:  CBC: Mild leukocytosis 12.5 BMP: Unremarkable Troponin: 10, 9 Rester panel: Negative Chest x-ray: No acute pathology EKG: Sinus 83 bpm, no ST elevations or depressions indicative of ischemia; similar to previous  Social Determinants of Health: none  Discussion with Independent Historian: None  Discussion of Management of Tests: None  Risk: Medium: prescription drug management  Risk Stratification Score: None  Plan: On exam patient was no acute distress with stable vitals.  Patient initially presented for multiple complaints however states he is no longer concerned about his chest pain as it was a muscle spasm and does not want further workup.  Patient's  labs and imaging for his chest pain were ultimately reassuring and so with patient not wanting further workup recommended patient takes Tylenol for his muscle spasms.  Patient does have bilateral erythematous bulging tympanic membrane  suspicious of bilateral AOM.  There were no concerning features or red flag symptoms that necessitate further workup.  Will give patient first dose of Augmentin here and follow-up with his primary care provider.  Patient was given return precautions. Patient stable for discharge at this time.  Patient verbalized understanding of plan.  This chart was dictated using voice recognition software.  Despite best efforts to proofread,  errors can occur which can change the documentation meaning.         Final Clinical Impression(s) / ED Diagnoses Final diagnoses:  Non-recurrent acute suppurative otitis media of both ears without spontaneous rupture of tympanic membranes    Rx / DC Orders ED Discharge Orders          Ordered    amoxicillin-clavulanate (AUGMENTIN) 875-125 MG tablet  Every 12 hours        04/20/23 1108              Remi Deter 04/20/23 1130    Jacalyn Lefevre, MD 04/20/23 1610

## 2023-04-22 ENCOUNTER — Telehealth: Payer: Self-pay | Admitting: *Deleted

## 2023-04-22 ENCOUNTER — Ambulatory Visit (HOSPITAL_BASED_OUTPATIENT_CLINIC_OR_DEPARTMENT_OTHER): Payer: Medicaid Other | Admitting: Physical Therapy
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
## 2023-04-22 NOTE — Patient Outreach (Signed)
  Care Management   Note  04/22/2023 Name: Russell Collier MRN: [id number] DOB: 07-15-1960  [name] is enrolled in a Managed Medicaid plan: Yes. Outreach attempt today was successful.   RNCM reaching out to Russell Collier to reschedule an upcoming follow up telephone outreach. [name] expressed concern regarding recent ED visit. RNCM provided patient with Patient Relateions and Grievance Department [phone]. RNCM advised patient to utilize PCP office and Urgent Care when appropriate.   Telephone follow up appointment with care management team member scheduled for:05/05/23 at 10:30am  Estanislado Emms RN, BSN [name]  [facility] RN Care Manager [phone]

## 2023-04-28 ENCOUNTER — Encounter (HOSPITAL_BASED_OUTPATIENT_CLINIC_OR_DEPARTMENT_OTHER): Payer: 59 | Admitting: Physical Therapy

## 2023-04-29 ENCOUNTER — Encounter (HOSPITAL_COMMUNITY): Payer: Medicaid Other

## 2023-04-29 ENCOUNTER — Encounter (HOSPITAL_COMMUNITY): Payer: Medicaid Other | Admitting: Internal Medicine

## 2023-05-02 ENCOUNTER — Encounter (HOSPITAL_COMMUNITY): Payer: Self-pay

## 2023-05-02 ENCOUNTER — Telehealth (HOSPITAL_BASED_OUTPATIENT_CLINIC_OR_DEPARTMENT_OTHER): Payer: Self-pay | Admitting: Physical Therapy

## 2023-05-02 ENCOUNTER — Ambulatory Visit (HOSPITAL_COMMUNITY)
Admit: 2023-05-02 | Discharge: 2023-05-02 | Disposition: A | Discharge status: Home / Self Care | Payer: Medicaid Other | Source: Ambulatory Visit | Attending: Family Medicine

## 2023-05-02 ENCOUNTER — Telehealth (HOSPITAL_COMMUNITY): Payer: Self-pay | Admitting: Licensed Clinical Social Worker

## 2023-05-02 VITALS — BP 94/62 | HR 85 | Ht 70.0 in | Wt 272.4 lb

## 2023-05-02 DIAGNOSIS — I110 Hypertensive heart disease with heart failure: Secondary | ICD-10-CM | POA: Insufficient documentation

## 2023-05-02 DIAGNOSIS — Z7984 Long term (current) use of oral hypoglycemic drugs: Secondary | ICD-10-CM | POA: Diagnosis not present

## 2023-05-02 DIAGNOSIS — B9689 Other specified bacterial agents as the cause of diseases classified elsewhere: Secondary | ICD-10-CM | POA: Insufficient documentation

## 2023-05-02 DIAGNOSIS — R002 Palpitations: Secondary | ICD-10-CM

## 2023-05-02 DIAGNOSIS — E854 Organ-limited amyloidosis: Secondary | ICD-10-CM | POA: Insufficient documentation

## 2023-05-02 DIAGNOSIS — Z955 Presence of coronary angioplasty implant and graft: Secondary | ICD-10-CM | POA: Insufficient documentation

## 2023-05-02 DIAGNOSIS — Z6839 Body mass index (BMI) 39.0-39.9, adult: Secondary | ICD-10-CM | POA: Diagnosis not present

## 2023-05-02 DIAGNOSIS — G629 Polyneuropathy, unspecified: Secondary | ICD-10-CM | POA: Diagnosis not present

## 2023-05-02 DIAGNOSIS — Z716 Tobacco abuse counseling: Secondary | ICD-10-CM | POA: Diagnosis not present

## 2023-05-02 DIAGNOSIS — I5032 Chronic diastolic (congestive) heart failure: Secondary | ICD-10-CM | POA: Insufficient documentation

## 2023-05-02 DIAGNOSIS — E851 Neuropathic heredofamilial amyloidosis: Secondary | ICD-10-CM | POA: Diagnosis not present

## 2023-05-02 DIAGNOSIS — Z7901 Long term (current) use of anticoagulants: Secondary | ICD-10-CM | POA: Insufficient documentation

## 2023-05-02 DIAGNOSIS — I739 Peripheral vascular disease, unspecified: Secondary | ICD-10-CM

## 2023-05-02 DIAGNOSIS — E1151 Type 2 diabetes mellitus with diabetic peripheral angiopathy without gangrene: Secondary | ICD-10-CM | POA: Insufficient documentation

## 2023-05-02 DIAGNOSIS — G4733 Obstructive sleep apnea (adult) (pediatric): Secondary | ICD-10-CM | POA: Insufficient documentation

## 2023-05-02 DIAGNOSIS — I25110 Atherosclerotic heart disease of native coronary artery with unstable angina pectoris: Secondary | ICD-10-CM | POA: Diagnosis not present

## 2023-05-02 DIAGNOSIS — L03116 Cellulitis of left lower limb: Secondary | ICD-10-CM | POA: Diagnosis not present

## 2023-05-02 DIAGNOSIS — Z79899 Other long term (current) drug therapy: Secondary | ICD-10-CM | POA: Insufficient documentation

## 2023-05-02 DIAGNOSIS — I43 Cardiomyopathy in diseases classified elsewhere: Secondary | ICD-10-CM | POA: Insufficient documentation

## 2023-05-02 DIAGNOSIS — I2510 Atherosclerotic heart disease of native coronary artery without angina pectoris: Secondary | ICD-10-CM | POA: Diagnosis not present

## 2023-05-02 DIAGNOSIS — F17210 Nicotine dependence, cigarettes, uncomplicated: Secondary | ICD-10-CM | POA: Insufficient documentation

## 2023-05-02 DIAGNOSIS — Z86711 Personal history of pulmonary embolism: Secondary | ICD-10-CM | POA: Insufficient documentation

## 2023-05-02 DIAGNOSIS — Z86718 Personal history of other venous thrombosis and embolism: Secondary | ICD-10-CM | POA: Insufficient documentation

## 2023-05-02 DIAGNOSIS — G63 Polyneuropathy in diseases classified elsewhere: Secondary | ICD-10-CM

## 2023-05-02 DIAGNOSIS — I10 Essential (primary) hypertension: Secondary | ICD-10-CM | POA: Diagnosis not present

## 2023-05-02 DIAGNOSIS — R42 Dizziness and giddiness: Secondary | ICD-10-CM | POA: Diagnosis present

## 2023-05-02 DIAGNOSIS — E119 Type 2 diabetes mellitus without complications: Secondary | ICD-10-CM

## 2023-05-02 DIAGNOSIS — Z720 Tobacco use: Secondary | ICD-10-CM

## 2023-05-02 LAB — CBC
HCT: 41.9 % (ref 39.0–52.0)
Hemoglobin: 13.9 g/dL (ref 13.0–17.0)
MCH: 29.1 pg (ref 26.0–34.0)
MCHC: 33.2 g/dL (ref 30.0–36.0)
MCV: 87.7 fL (ref 80.0–100.0)
Platelets: 313 10*3/uL (ref 150–400)
RBC: 4.78 MIL/uL (ref 4.22–5.81)
RDW: 15.7 % — ABNORMAL HIGH (ref 11.5–15.5)
WBC: 9.8 10*3/uL (ref 4.0–10.5)
nRBC: 0 % (ref 0.0–0.2)

## 2023-05-02 LAB — BRAIN NATRIURETIC PEPTIDE: B Natriuretic Peptide: 25.9 pg/mL (ref 0.0–100.0)

## 2023-05-02 LAB — IRON AND TIBC
Iron: 48 ug/dL (ref 45–182)
Saturation Ratios: 13 % — ABNORMAL LOW (ref 17.9–39.5)
TIBC: 358 ug/dL (ref 250–450)
UIBC: 310 ug/dL

## 2023-05-02 LAB — FERRITIN: Ferritin: 101 ng/mL (ref 24–336)

## 2023-05-02 LAB — BASIC METABOLIC PANEL
Anion gap: 12 (ref 5–15)
BUN: 27 mg/dL — ABNORMAL HIGH (ref 8–23)
CO2: 25 mmol/L (ref 22–32)
Calcium: 9.6 mg/dL (ref 8.9–10.3)
Chloride: 100 mmol/L (ref 98–111)
Creatinine, Ser: 1.39 mg/dL — ABNORMAL HIGH (ref 0.61–1.24)
GFR, Estimated: 57 mL/min — ABNORMAL LOW (ref >60)
Glucose, Bld: 99 mg/dL (ref 70–99)
Potassium: 4.5 mmol/L (ref 3.5–5.1)
Sodium: 137 mmol/L (ref 135–145)

## 2023-05-02 LAB — TSH: TSH: 1.083 u[IU]/mL (ref 0.350–4.500)

## 2023-05-02 MED ORDER — ENALAPRIL MALEATE 5 MG PO TABS: 5.0000 mg | ORAL_TABLET | Freq: Every day | ORAL | 3 refills | Status: DC

## 2023-05-02 NOTE — Telephone Encounter (Signed)
 H&V Care Navigation CSW Progress Note  Patient called into clinic to report he no longer had a ride through insurance for todays appt- reports appt is important as he needs to get a shot today.  CSW able to arrange taxi to pick pt up at 1pm from his home- confirms he can get in and out of a vehicle on his own.   SDOH Screenings   Food Insecurity: No Food Insecurity (04/04/2023)  Housing: Low Risk  (04/04/2023)  Transportation Needs: Unmet Transportation Needs (05/02/2023)  Utilities: Not At Risk (04/04/2023)  Alcohol Screen: Low Risk  (11/14/2020)  Depression (PHQ2-9): Low Risk  (10/18/2022)  Financial Resource Strain: High Risk (11/14/2020)  Physical Activity: Insufficiently Active (05/11/2021)  Social Connections: Moderately Isolated (10/16/2018)  Stress: No Stress Concern Present (10/16/2018)  Tobacco Use: High Risk (04/19/2023)   05/02/2023  [name] DOB: [birth year] MRN: [id number]   RIDER WAIVER AND RELEASE OF LIABILITY  For the purposes of helping with transportation needs, [facility] partners with outside transportation providers (taxi companies, [place], Catering manager.) to give [employer] patients or other approved people the choice of on-demand rides [employer]") to our buildings for non-emergency visits.  By using [employer], I, the person signing this document, on behalf of myself and/or any legal minors (in my care using the [employer]), agree:  Science writer given to me are supplied by independent, outside transportation providers who do not work for, or have any affiliation with, [employer]. [facility] is not a transportation company. [facility] has no control over the quality or safety of the rides I get using [employer]. [facility] has no control over whether any outside ride will happen on time or not. [facility] gives no guarantee on the reliability, quality, safety, or availability on any rides, or that no mistakes will happen. I know  and accept that traveling by vehicle (car, truck, SVU, Zenaida Niece, bus, taxi, etc.) has risks of serious injuries such as disability, being paralyzed, and death. I know and agree the risk of using [employer] is mine alone, and not [employer]. Transport Services are provided "as is" and as are available. The transportation providers are in charge for all inspections and care of the vehicles used to provide these rides. I agree not to take legal action against [place], its agents, employees, officers, directors, representatives, insurers, attorneys, assigns, successors, subsidiaries, and affiliates at any time for any reasons related directly or indirectly to using [employer]. I also agree not to take legal action against [facility] or its affiliates for any injury, death, or damage to property caused by or related to using [employer]. I have read this Waiver and Release of Liability, and I understand the terms used in it and their legal meaning. This Waiver is freely and voluntarily given with the understanding that my right (or any legal minors) to legal action against [place] relating to [employer] is knowingly given up to use these services.   I attest that I read the Ride Waiver and Release of Liability to [name], gave Mr. Swopes the opportunity to ask questions and answered the questions asked (if any). I affirm that [name] then provided consent for assistance with transportation.     Burna Sis

## 2023-05-02 NOTE — Patient Instructions (Signed)
 Medication Changes:  DECREASE ENALAPRIL TO 5MG  ONCE DAILY   Lab Work:  Labs done today, your results will be available in MyChart, we will contact you for abnormal readings.  Special Instructions // Education:  CHECK BLOOD PRESSURE DAILY- WRITE THE NUMBER DOWN AND BRING TO NEXT OFFICE VISIT   Follow-Up in: AS SCHEDULED WITH APP   At the [facility] Clinic, you and your health needs are our priority. We have a designated team specialized in the treatment of Heart Failure. This Care Team includes your primary Heart Failure Specialized Cardiologist (physician), Advanced Practice Providers (APPs- Physician Assistants and Nurse Practitioners), and Pharmacist who all work together to provide you with the care you need, when you need it.   You may see any of the following providers on your designated Care Team at your next follow up:  Dr. Arvilla Meres Dr. Marca Ancona Dr. Dorthula Nettles Dr. Theresia Bough Tonye Becket, NP Robbie Lis, [place] [facility] [place], [place] Brynda Peon, NP [place] Lee, NP Karle Plumber, PharmD   Please be sure to bring in all your medications bottles to every appointment.   Need to Contact us:  If you have any questions or concerns before your next appointment please send [place] a message through [place] or call our office at [phone].    TO LEAVE A MESSAGE FOR THE NURSE SELECT OPTION 2, PLEASE LEAVE A MESSAGE INCLUDING: YOUR NAME DATE OF BIRTH CALL BACK NUMBER REASON FOR CALL**this is important as we prioritize the call backs  YOU WILL RECEIVE A CALL BACK THE SAME DAY AS LONG AS YOU CALL BEFORE 4:00 PM

## 2023-05-02 NOTE — Telephone Encounter (Signed)
 [name] called to express his frustration regarding the changes and cancellations of his appointments. He discussed the matter with our Support Rep, Russell Collier, and stated that she had no right to cancel his remaining appointments without his authorization. He had only canceled his February 28th appointment due to hospitalization. Since he relies on medical transportation, he needs to inform them of all his scheduled appointments in advance. He claims that all his March appointments were canceled without his consent.  I assured him that he still has three appointments scheduled, although the dates differ from his original schedule. I apologized for the misunderstanding and clarified that we did not cancel all his appointments. [name] mentioned he will be filing a complaint with the Rehab people and [place] about the situation. He requested a supervisor to call him.

## 2023-05-03 ENCOUNTER — Telehealth (HOSPITAL_COMMUNITY): Payer: Self-pay | Admitting: Cardiology

## 2023-05-03 MED ORDER — FUROSEMIDE 40 MG PO TABS: 40.0000 mg | ORAL_TABLET | Freq: Every day | ORAL | 3 refills | Status: DC

## 2023-05-03 NOTE — Telephone Encounter (Signed)
 Patient called.  Patient aware  Message to Philicia to arrange PA for iron infusion

## 2023-05-03 NOTE — Telephone Encounter (Signed)
-----   Message from [place] sent at 05/02/2023  4:27 PM EDT ----- Labs stable but [place] and iron are low, iron deficiency could be contributing to symptoms. Please arrange iron infusion if he is agreeable.  Decrease Lasix to 40 mg daily (currently taking bid)

## 2023-05-04 ENCOUNTER — Ambulatory Visit: Payer: Medicaid Other | Admitting: *Deleted

## 2023-05-04 ENCOUNTER — Other Ambulatory Visit: Payer: Self-pay

## 2023-05-05 ENCOUNTER — Ambulatory Visit (HOSPITAL_BASED_OUTPATIENT_CLINIC_OR_DEPARTMENT_OTHER): Payer: Self-pay | Attending: Family Medicine | Admitting: Physical Therapy

## 2023-05-05 ENCOUNTER — Encounter (HOSPITAL_BASED_OUTPATIENT_CLINIC_OR_DEPARTMENT_OTHER): Payer: Self-pay | Admitting: Physical Therapy

## 2023-05-05 ENCOUNTER — Other Ambulatory Visit: Payer: Self-pay | Admitting: *Deleted

## 2023-05-05 DIAGNOSIS — M6281 Muscle weakness (generalized): Secondary | ICD-10-CM | POA: Diagnosis present

## 2023-05-05 DIAGNOSIS — R262 Difficulty in walking, not elsewhere classified: Secondary | ICD-10-CM | POA: Diagnosis present

## 2023-05-05 DIAGNOSIS — M5459 Other low back pain: Secondary | ICD-10-CM | POA: Diagnosis present

## 2023-05-05 DIAGNOSIS — M79671 Pain in right foot: Secondary | ICD-10-CM | POA: Diagnosis present

## 2023-05-05 DIAGNOSIS — M79672 Pain in left foot: Secondary | ICD-10-CM | POA: Diagnosis present
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
## 2023-05-05 NOTE — Patient Outreach (Signed)
 Care Coordination  05/05/2023  Kendrell Lottman 09/26/1960 [id number]  Successful telephone outreach with Mr. Reither. However, he is at another appointment and request to reschedule this telephone appointment. A new visit was scheduled on 05/06/23 at 1:15pm. Patient agreed to new date and time.  Estanislado Emms RN, BSN [place]  [facility] RN Care Manager [phone]

## 2023-05-06 ENCOUNTER — Encounter (HOSPITAL_BASED_OUTPATIENT_CLINIC_OR_DEPARTMENT_OTHER): Payer: 59 | Admitting: Physical Therapy

## 2023-05-06 ENCOUNTER — Telehealth (HOSPITAL_COMMUNITY): Payer: Self-pay | Admitting: Cardiology

## 2023-05-06 ENCOUNTER — Ambulatory Visit (HOSPITAL_BASED_OUTPATIENT_CLINIC_OR_DEPARTMENT_OTHER): Admitting: Physical Therapy

## 2023-05-06 ENCOUNTER — Other Ambulatory Visit: Payer: Self-pay | Admitting: *Deleted

## 2023-05-06 ENCOUNTER — Encounter (HOSPITAL_BASED_OUTPATIENT_CLINIC_OR_DEPARTMENT_OTHER): Payer: Self-pay | Admitting: Physical Therapy

## 2023-05-06 DIAGNOSIS — M5459 Other low back pain: Secondary | ICD-10-CM

## 2023-05-06 DIAGNOSIS — M79671 Pain in right foot: Secondary | ICD-10-CM | POA: Diagnosis not present

## 2023-05-06 DIAGNOSIS — M6281 Muscle weakness (generalized): Secondary | ICD-10-CM

## 2023-05-06 DIAGNOSIS — R262 Difficulty in walking, not elsewhere classified: Secondary | ICD-10-CM
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
## 2023-05-06 NOTE — Patient Outreach (Signed)
 Medicaid Managed Care   Nurse Care Manager Note  05/06/2023 Name:  Russell Collier MRN:  [id number] DOB:  07-Sep-1960  Russell Collier is an 63 y.o. year old male who is a primary patient of Jerre Simon, MD.  The [facility] Managed Care Coordination team was consulted for assistance with:    Limited Mobility  Russell Collier was given information about Medicaid Managed Care Coordination team services today. Russell Collier Patient agreed to services and verbal consent obtained.  Engaged with patient by telephone for follow up visit in response to provider referral for case management and/or care coordination services.   Patient is participating in a Managed Medicaid Plan:  no  Assessments/Interventions:  Review of past medical history, allergies, medications, health status, including review of consultants reports, laboratory and other test data, was performed as part of comprehensive evaluation and provision of chronic care management services.  SDOH (Social Drivers of Health) assessments and interventions performed: SDOH Interventions    Flowsheet Row Telephone from 05/02/2023 in [facility] Patient Outreach Telephone from 04/04/2023 in [place] [facility]PARTMENT Telephone from 08/04/2022 in [place] POPULATION HEALTH DEPARTMENT Refill from 04/27/2022 in [facility] Care Coordination Patient Outreach Telephone from 08/10/2021 in [facility] Care Coordination Patient Outreach Telephone from 07/15/2021 in [facility] Care Coordination  SDOH Interventions        Food Insecurity Interventions -- Intervention Not Indicated -- -- -- --  Housing Interventions -- Intervention Not Indicated -- -- -- --  Soil scientist Given -- Intervention Not Indicated SCAT Psychologist, clinical) Intervention Not Indicated  [Utilizing UHC transportation]  Intervention Not Indicated  [Utilizing UHC medical transportation]  Utilities Interventions -- Intervention Not Indicated -- -- -- --       Care Plan  Allergies  Allergen Reactions   Chantix [Varenicline] Anaphylaxis, Swelling and Other (See Comments)    Patient reports laryngospasm stopped in the ER, throat swelling   Bupropion Other (See Comments)    Headache - moderate/severe - self discontinued agent    Bactrim [Sulfamethoxazole-Trimethoprim] Rash    Rash and hives   Nicoderm [Nicotine] Rash    Patch    Medications Reviewed Today     Reviewed by Heidi Dach, RN (Registered Nurse) on 05/06/23 at 1359  Med List Status: <None>   Medication Order Taking? Sig Documenting Provider Last Dose Status Informant  acetaminophen (TYLENOL) 325 MG tablet [id number] Yes Take 2 tablets (650 mg total) by mouth every 6 (six) hours as needed for mild pain or moderate pain. Darral Dash, DO Taking Active Self  albuterol (PROVENTIL) (2.5 MG/3ML) 0.083% nebulizer solution [id number] Yes Take 3 mLs (2.5 mg total) by nebulization every 6 (six) hours as needed for wheezing or shortness of breath. Celedonio Savage, MD Taking Active Self  allopurinol (ZYLOPRIM) 100 MG tablet [id number] Yes TAKE 1 TABLET(100 MG) BY MOUTH DAILY Jerre Simon, MD Taking Active   apixaban (ELIQUIS) 2.5 MG TABS tablet [id number] Yes Take 1 tablet (2.5 mg total) by mouth 2 (two) times daily. [name], Bevelyn Buckles, MD Taking Active   betamethasone valerate ointment (VALISONE) 0.1 % [id number] Yes Apply 1 Application topically 2 (two) times daily. Shelby Mattocks, DO Taking Active   Blood Glucose Monitoring Suppl DEVI [id number] No 1 each by Does not apply route in the morning, at noon, and at bedtime. May substitute to any manufacturer covered by patient's insurance.  Patient not taking: Reported on  05/06/2023   Valetta Close, MD Not Taking Active            Med Note Ardelia Mems, Gustava Berland A   Mon Apr 04, 2023  2:41 PM) Broken   clopidogrel (PLAVIX) 75 MG tablet [id number] Yes Take 1 tablet (75 mg total) by mouth daily. Shelby Mattocks, DO Taking Active   colchicine 0.6 MG tablet [id number] Yes Take 2 tablets for 1.2 mg today, and 1 tablet one hour later. Then 0.6 mg daily for 3 days. Jerre Simon, MD Taking Active   dapagliflozin propanediol (FARXIGA) 10 MG TABS tablet [id number] Yes Take 1 tablet (10 mg total) by mouth daily before breakfast. [name], Bevelyn Buckles, MD Taking Active   enalapril (VASOTEC) 5 MG tablet [id number] Yes Take 1 tablet (5 mg total) by mouth at bedtime. [place], Anderson Malta, FNP Taking Active   fluticasone [facility]) 50 MCG/ACT nasal spray [id number] Yes Place 1 spray into both nostrils daily. Shelby Mattocks, DO Taking Active   furosemide (LASIX) 40 MG tablet [id number] Yes Take 1 tablet (40 mg total) by mouth daily. Townsend, Anderson Malta, FNP Taking Active   lidocaine (LIDODERM) 5 % [id number] Yes Place 1 patch onto the skin daily. Remove & Discard patch within 12 hours or as directed by MD Nicanor Alcon, April, MD Taking Active   multivitamin (ONE-A-DAY MEN'S) TABS tablet [id number] Yes Take 1 tablet by mouth daily with breakfast. [provider] Taking Active Self  pantoprazole (PROTONIX) 40 MG tablet [id number] Yes Take 1 tablet (40 mg total) by mouth daily. Shelby Mattocks, DO Taking Active   potassium chloride SA (KLOR-CON M) 20 MEQ tablet [id number] Yes Take 2 tablets (40 mEq total) by mouth daily. [name], Bevelyn Buckles, MD Taking Active   pregabalin (LYRICA) 100 MG capsule [id number] Yes Take 1 capsule (100 mg total) by mouth 3 (three) times daily. Shelby Mattocks, DO Taking Active   rosuvastatin (CRESTOR) 10 MG tablet [id number] Yes Take 1 tablet (10 mg total) by mouth daily. Shelby Mattocks, DO Taking Active   Tafamidis (VYNDAMAX) 61 MG CAPS [id number] Yes TAKE 1 CAPSULE BY MOUTH DAILY. [name], Bevelyn Buckles, MD Taking Active   triamcinolone ointment (KENALOG) 0.5 % [id number] Yes Apply 1 Application topically 2  (two) times daily. Shelby Mattocks, DO Taking Active   umeclidinium-vilanterol [facility] ELLIPTA) 62.5-25 MCG/ACT AEPB [id number] Yes Inhale 1 puff into the lungs daily. Shelby Mattocks, DO Taking Active   valACYclovir (VALTREX) 500 MG tablet [id number] Yes Take 1 tablet (500 mg total) by mouth daily. Jerre Simon, MD Taking Active   VENTOLIN HFA 108 [phone] Base) MCG/ACT inhaler [id number] Yes Inhale 2 puffs into the lungs every 6 (six) hours as needed for wheezing or shortness of breath. Shelby Mattocks, DO Taking Active   Vitamin A 2400 MCG (8000 UT) TABS [id number] Yes Take 1 tablet by mouth daily. [name], Bevelyn Buckles, MD Taking Active Self           Med Note Antony Madura, Dyane Dustman Jun 28, 2021  9:38 PM)    vutrisiran sodium (AMVUTTRA) 25 MG/0.5ML syringe [id number] Yes Inject 0.5 mLs (25 mg total) into the skin every 3 (three) months. [name], Bevelyn Buckles, MD Taking Active Self            Patient Active Problem List   Diagnosis Date Noted   Foot pain, bilateral 03/13/2023   Gout 03/12/2023   Right flank hematoma, subsequent encounter 01/04/2023   Acute pain of right thigh 08/13/2022   HSV (herpes simplex virus) infection 01/18/2022  Unspecified skin changes 08/21/2021   Pulmonary hypertension, primary (HCC)    Cellulitis 07/09/2021   Urinary tract infection without hematuria    Sepsis (HCC) 06/28/2021   Community acquired pneumonia    Gout 02/26/2021   Skin tag 02/12/2021   Skin cyst 02/12/2021   Anticoagulant long-term use 01/29/2021   Dermoid cyst of skin of nose 12/24/2020   Strain of right calf muscle 07/01/2020   Status post lumbar spinal fusion 06/06/2020   Impaired mobility and ADLs 04/23/2020   Midline low back pain 04/23/2020   Neuropathy, 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   COPD exacerbation (HCC) 03/20/2020   Type 2 diabetes mellitus with diabetic peripheral angiopathy without gangrene, without long-term current use of insulin (HCC) 03/20/2020   Coagulation defect (HCC) 12/11/2019    Fatigue 12/10/2019   Frequent epistaxis 10/30/2019   Disc degeneration, 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   Ganglion of right knee 09/27/2019   Lumbar radiculopathy 09/27/2019   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HCC)    Respiratory failure (HCC) 07/27/2019   Primary osteoarthritis of right knee 07/24/2019   Chronic pain 05/31/2019   AKI (acute kidney injury) (HCC) 05/31/2019   Acute foot pain 05/16/2019   Chronic knee pain 04/16/2019   Diabetes (HCC) 04/16/2019   Esophageal reflux 02/06/2019   Chronic skin ulcer of lower leg (HCC) 12/21/2018   S/P drug eluting coronary stent placement    Coronary artery disease involving native coronary artery of native heart with unstable angina pectoris (HCC) 12/20/2018   Unstable angina (HCC)    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   Acute on chronic diastolic heart failure (HCC)    Shortness of breath    Idiopathic peripheral neuropathy    Chronic diastolic heart failure (HCC)    Abnormal ankle brachial index (ABI)    History of cocaine abuse (HCC) 08/20/2018   Marijuana use 08/20/2018   Morbid obesity with BMI of 40.0-44.9, adult (HCC) 08/20/2018   Dyspepsia    Morbid obesity (HCC)    OSA on CPAP    CAD (coronary artery disease) 03/30/2018   Tobacco abuse 02/03/2018   Hyperlipidemia 02/03/2018   Acute respiratory failure with hypoxia (HCC) 02/02/2018   Chronic congestive heart failure  Familial TTR Cardiac Amyloidosis    Keratoconjunctivitis sicca of left eye not specified as Sjogren's 09/06/2017   Long term current use of oral hypoglycemic drug 09/06/2017   Mechanical ectropion of left lower eyelid 09/06/2017   Nuclear sclerotic cataract of both eyes 09/06/2017   Refractive amblyopia, left 09/06/2017   Type 2 diabetes mellitus without complication, without long-term current use of insulin (HCC) 09/06/2017   Hyperopia of both eyes with astigmatism and presbyopia 09/06/2017   Essential hypertension    Neuropathy     Conditions to be addressed/monitored per PCP  order:   Limited Mobility  Care Plan : RN Care Manager Plan of Care  Updates made by Heidi Dach, RN since 05/06/2023 12:00 AM     Problem: Health Management needs related to Limited Mobility      Long-Range Goal: Development of Plan of Care to address Health Management needs related to Limited Mobility   Start Date: 04/04/2023  Expected End Date: 07/03/2023  Note:   Current Barriers:  Knowledge Deficits related to plan of care for management of Limited Mobility   RNCM Clinical Goal(s):  Patient will attend all scheduled medical appointments: Weekly PT, 05/16/23 for Infusion, 06/07/23 with Pharmacy, 06/09/23 with [facility] and 06/13/23 with HF Clinic as evidenced by provider documentation continue to work with RN Care Manager  to address care management and care coordination needs related to  Limited Mobility as evidenced by adherence to CM Team Scheduled appointments    Interventions: Evaluation of current treatment plan related to  self management and patient's adherence to plan as established by provider   Limited Mobility  (Status:  Goal on track:  Yes.)  Long Term Goal Evaluation of current treatment plan related to  Limited Mobility ,  self-management and patient's adherence to plan as established by provider. Discussed plans with patient for ongoing care management follow up and provided patient with direct contact information for care management team Reviewed medications with patient and discussed recent medication changes as directed by HF Clinic Reviewed scheduled/upcoming provider appointments including PT on Fridays, 05/16/23 for infusion, 06/07/23 with Pharmacy, 06/09/23 with [facility] and 06/13/23 with HF Clinic Assessed social determinant of health barriers Ensured patient has arranged transportation to upcoming appointment Discussed patient now has [employer]  Advised contacting [employer] for member benefits and inquiring about a new BP machine and glucometer Discussed patient has been approved  for 74 hours/month of PCS Discussed transition to new Care Manager  Patient Goals/Self-Care Activities: Take all medications as prescribed Attend all scheduled provider appointments Call provider office for new concerns or questions   Follow Up Plan:  Telephone follow up appointment with care management team member scheduled for:  07/06/23 at 11am      Follow Up:  Patient agrees to Care Plan and Follow-up.  Plan: The Managed Medicaid care management team will reach out to the patient again over the next 60 days.  Date/time of next scheduled RN care management/care coordination outreach:  07/06/23 at 11am  Estanislado Emms RN, BSN [place]  [facility] RN Care Manager [phone]

## 2023-05-06 NOTE — Patient Instructions (Signed)
 Visit Information  Mr. Rajewski was given information about Medicaid Managed Care team care coordination services and verbally consented to engagement with the [facility] Managed Care team.   Mr. Vassell,   Please see education materials related to blood pressure provided as print materials.   The patient verbalized understanding of instructions, educational materials, and care plan provided today and agreed to receive a mailed copy of patient instructions, educational materials, and care plan.   Telephone follow up appointment with Managed Medicaid care management team member scheduled for:07/06/23 at 11am  Following is a copy of your plan of care:  Care Plan : RN Care Manager Plan of Care  Updates made by Heidi Dach, RN since 05/06/2023 12:00 AM     Problem: Health Management needs related to Limited Mobility      Long-Range Goal: Development of Plan of Care to address Health Management needs related to Limited Mobility   Start Date: 04/04/2023  Expected End Date: 07/03/2023  Note:   Current Barriers:  Knowledge Deficits related to plan of care for management of Limited Mobility   RNCM Clinical Goal(s):  Patient will attend all scheduled medical appointments: Weekly PT, 05/16/23 for Infusion, 06/07/23 with Pharmacy, 06/09/23 with [facility] and 06/13/23 with HF Clinic as evidenced by provider documentation continue to work with RN Care Manager to address care management and care coordination needs related to  Limited Mobility as evidenced by adherence to CM Team Scheduled appointments    Interventions: Evaluation of current treatment plan related to  self management and patient's adherence to plan as established by provider   Limited Mobility  (Status:  Goal on track:  Yes.)  Long Term Goal Evaluation of current treatment plan related to  Limited Mobility ,  self-management and patient's adherence to plan as established by provider. Discussed plans with patient for ongoing care management  follow up and provided patient with direct contact information for care management team Reviewed medications with patient and discussed recent medication changes as directed by HF Clinic Reviewed scheduled/upcoming provider appointments including PT on Fridays, 05/16/23 for infusion, 06/07/23 with Pharmacy, 06/09/23 with [facility] and 06/13/23 with HF Clinic Assessed social determinant of health barriers Ensured patient has arranged transportation to upcoming appointment Discussed patient now has [employer]  Advised contacting [employer] for member benefits and inquiring about a new BP machine and glucometer Discussed patient has been approved for 74 hours/month of PCS Discussed transition to new Care Manager  Patient Goals/Self-Care Activities: Take all medications as prescribed Attend all scheduled provider appointments Call provider office for new concerns or questions   Follow Up Plan:  Telephone follow up appointment with care management team member scheduled for:  07/06/23 at 11am       Estanislado Emms RN, BSN [place]  [facility] RN Care Manager [phone]

## 2023-05-06 NOTE — Telephone Encounter (Signed)
 Patient lvm to r/s shot, please advise

## 2023-05-09 ENCOUNTER — Other Ambulatory Visit (HOSPITAL_COMMUNITY): Payer: Self-pay

## 2023-05-09 ENCOUNTER — Telehealth (HOSPITAL_COMMUNITY): Payer: Self-pay

## 2023-05-09 NOTE — Telephone Encounter (Signed)
 Called patient to reschedule. He does not want to reschedule Amvuttra, needed to reschedule an iron infusion. This has already been taken care of at time of call.

## 2023-05-09 NOTE — Telephone Encounter (Signed)
 Advanced Heart Failure Patient Advocate Encounter  Test claims for Vyndamax are rejecting due to cost limit exceeded. Contacted insurance (reference [id number]) to obtain override. Authorization was approved and is active until plan ends on 05/23/2023. Test claim returns $4.80 copay for 30 days.  Burnell Blanks, CPhT Rx Patient Advocate Phone: [phone]

## 2023-05-12 ENCOUNTER — Encounter (HOSPITAL_BASED_OUTPATIENT_CLINIC_OR_DEPARTMENT_OTHER): Payer: 59 | Admitting: Physical Therapy

## 2023-05-12 ENCOUNTER — Other Ambulatory Visit (HOSPITAL_COMMUNITY): Payer: Self-pay

## 2023-05-12 DIAGNOSIS — I5032 Chronic diastolic (congestive) heart failure: Secondary | ICD-10-CM

## 2023-05-13 ENCOUNTER — Encounter (HOSPITAL_BASED_OUTPATIENT_CLINIC_OR_DEPARTMENT_OTHER): Payer: Self-pay | Admitting: Physical Therapy

## 2023-05-13 ENCOUNTER — Ambulatory Visit (HOSPITAL_BASED_OUTPATIENT_CLINIC_OR_DEPARTMENT_OTHER): Admitting: Physical Therapy

## 2023-05-13 ENCOUNTER — Encounter (HOSPITAL_COMMUNITY)

## 2023-05-13 DIAGNOSIS — M5459 Other low back pain: Secondary | ICD-10-CM

## 2023-05-13 DIAGNOSIS — M6281 Muscle weakness (generalized): Secondary | ICD-10-CM

## 2023-05-13 DIAGNOSIS — M79671 Pain in right foot: Secondary | ICD-10-CM | POA: Diagnosis not present

## 2023-05-13 DIAGNOSIS — R262 Difficulty in walking, not elsewhere classified: Secondary | ICD-10-CM
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
## 2023-05-16 ENCOUNTER — Encounter (HOSPITAL_COMMUNITY)
Admission: RE | Admit: 2023-05-16 | Discharge: 2023-05-16 | Disposition: A | Discharge status: Home / Self Care | Source: Ambulatory Visit | Attending: Cardiology | Admitting: Cardiology

## 2023-05-16 DIAGNOSIS — I5032 Chronic diastolic (congestive) heart failure: Secondary | ICD-10-CM | POA: Diagnosis present

## 2023-05-16 MED ORDER — FERUMOXYTOL INJECTION 510 MG/17 ML
510.0000 mg | Freq: Once | INTRAVENOUS | Status: AC
  Administered 2023-05-16: 510 mg via INTRAVENOUS
  Filled 2023-05-16: qty 510

## 2023-05-18 ENCOUNTER — Other Ambulatory Visit: Payer: Self-pay

## 2023-05-18 ENCOUNTER — Other Ambulatory Visit (HOSPITAL_COMMUNITY): Payer: Self-pay | Admitting: Internal Medicine

## 2023-05-18 MED ORDER — VYNDAMAX 61 MG PO CAPS
1.0000 | ORAL_CAPSULE | Freq: Every day | ORAL | 6 refills | Status: DC
  Filled 2023-05-18: qty 30, 30d supply, fill #0
  Filled 2023-06-08 (×2): qty 30, 30d supply, fill #1
  Filled 2023-07-05: qty 30, 30d supply, fill #2
  Filled 2023-08-11: qty 30, 30d supply, fill #3
  Filled 2023-09-09: qty 30, 30d supply, fill #4
  Filled 2023-10-04: qty 30, 30d supply, fill #5
  Filled 2023-11-02: qty 30, 30d supply, fill #6

## 2023-05-18 NOTE — Progress Notes (Signed)
 Specialty Pharmacy Refill Coordination Note  Russell Collier is a 63 y.o. male contacted today regarding refills of specialty medication(s) Tafamidis Jeannie Fend)   Patient requested Delivery   Delivery date: 05/23/23   Verified address: [address] [place]   Medication will be filled on 05/20/23, pending refill approval.   This fill date is pending response to refill request from provider. Patient is aware and if they have not received fill by intended date they must follow up with pharmacy.

## 2023-05-19 ENCOUNTER — Ambulatory Visit (HOSPITAL_BASED_OUTPATIENT_CLINIC_OR_DEPARTMENT_OTHER): Admitting: Physical Therapy

## 2023-05-19 ENCOUNTER — Encounter (HOSPITAL_BASED_OUTPATIENT_CLINIC_OR_DEPARTMENT_OTHER): Payer: Self-pay | Admitting: Physical Therapy

## 2023-05-19 DIAGNOSIS — M79671 Pain in right foot: Secondary | ICD-10-CM | POA: Diagnosis not present

## 2023-05-19 DIAGNOSIS — R262 Difficulty in walking, not elsewhere classified: Secondary | ICD-10-CM
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
## 2023-05-20 ENCOUNTER — Other Ambulatory Visit: Payer: Self-pay

## 2023-05-24 ENCOUNTER — Other Ambulatory Visit (HOSPITAL_COMMUNITY): Payer: Self-pay

## 2023-05-24 ENCOUNTER — Telehealth (HOSPITAL_COMMUNITY): Payer: Self-pay | Admitting: Pharmacist

## 2023-05-24 ENCOUNTER — Other Ambulatory Visit: Payer: Self-pay | Admitting: Student

## 2023-05-24 DIAGNOSIS — M109 Gout, unspecified: Secondary | ICD-10-CM

## 2023-05-24 NOTE — Telephone Encounter (Signed)
 Patient Advocate Encounter   Received notification from [facility] that prior authorization for Amvuttra is required.   PA submitted on CoverMyMeds Key [id number] Status is pending   Will continue to follow.  Karle Plumber, PharmD, BCPS, BCCP, CPP [facility] Pharmacist [phone]

## 2023-05-24 NOTE — Telephone Encounter (Signed)
 Patient Advocate Encounter   Received notification from [facility] that prior authorization for Vyndamax is required.   PA submitted on CoverMyMeds Key [id number] Status is pending   Will continue to follow.   Karle Plumber, PharmD, BCPS, BCCP, CPP [facility] Clinic Pharmacist [phone]

## 2023-05-24 NOTE — Telephone Encounter (Signed)
 Advanced [facility] Failure Patient Advocate Encounter  Prior Authorization for Amvuttra has been approved.    PA# [id number] Effective dates: 02/23/23 through 02/22/24  Patients co-pay is $0.00.  Of note, patient used to get Amvuttra through his medical benefit while on Medicaid. Now that his insurance has changed to [facility], it will be billed under his pharmacy benefit.   Karle Plumber, PharmD, BCPS, BCCP, CPP [facility] Failure Clinic Pharmacist [phone]

## 2023-05-25 ENCOUNTER — Other Ambulatory Visit (HOSPITAL_COMMUNITY): Payer: Self-pay | Admitting: Pharmacist

## 2023-05-25 ENCOUNTER — Other Ambulatory Visit (HOSPITAL_COMMUNITY): Payer: Self-pay

## 2023-05-25 ENCOUNTER — Other Ambulatory Visit: Payer: Self-pay

## 2023-05-25 ENCOUNTER — Encounter (HOSPITAL_COMMUNITY): Payer: Self-pay

## 2023-05-25 MED ORDER — AMVUTTRA 25 MG/0.5ML ~~LOC~~ SOSY
25.0000 mg | PREFILLED_SYRINGE | SUBCUTANEOUS | 3 refills | Status: DC
  Filled 2023-05-25: qty 0.5, 90d supply, fill #0
  Filled 2023-08-10 – 2023-08-16 (×2): qty 0.5, 90d supply, fill #1
  Filled 2023-11-24: qty 0.5, 90d supply, fill #2

## 2023-05-25 NOTE — Telephone Encounter (Signed)
 Advanced Heart Failure Patient Advocate Encounter  Prior Authorization for Jeannie Fend has been approved.     Effective dates: 05/25/23 through 02/22/24  Karle Plumber, PharmD, BCPS, BCCP, CPP Heart Failure Clinic Pharmacist [phone]

## 2023-05-25 NOTE — Progress Notes (Signed)
 Specialty Pharmacy Initial Fill Coordination Note  Russell Collier is a 63 y.o. male contacted today regarding initial fill of specialty medication(s) Vutrisiran Sodium [place] [facility])   Patient requested Courier to Provider Office   Delivery date: 06/02/23   Verified address: [facility] [address] [place] [address]   Medication will be filled on 06/01/2023.   Patient is aware of $0 copayment.

## 2023-05-25 NOTE — Progress Notes (Signed)
 Specialty Pharmacy Initiation Note   Russell Collier is a 62 y.o. male who will be followed by the specialty pharmacy service for RxSp Cardiology    Review of administration, indication, effectiveness, safety, potential side effects, storage/disposable, and missed dose instructions occurred today for patient's specialty medication(s) Vutrisiran Sodium (Amvuttra)     Patient/Caregiver did not have any additional questions or concerns.   Patient's therapy is appropriate to: Continue    Goals Addressed             This Visit's Progress    Slow Disease Progression       Patient is on track. Patient will maintain adherence         Zahid Carneiro [place] Special[facility] Pharmacist

## 2023-06-01 ENCOUNTER — Encounter (HOSPITAL_BASED_OUTPATIENT_CLINIC_OR_DEPARTMENT_OTHER): Admitting: Physical Therapy

## 2023-06-03 ENCOUNTER — Encounter (HOSPITAL_COMMUNITY): Payer: Self-pay

## 2023-06-03 ENCOUNTER — Emergency Department (HOSPITAL_COMMUNITY)
Admission: EM | Admit: 2023-06-03 | Discharge: 2023-06-03 | Disposition: A | Discharge status: Home / Self Care | Attending: Emergency Medicine | Admitting: Emergency Medicine

## 2023-06-03 ENCOUNTER — Other Ambulatory Visit: Payer: Self-pay

## 2023-06-03 DIAGNOSIS — Z79899 Other long term (current) drug therapy: Secondary | ICD-10-CM | POA: Diagnosis not present

## 2023-06-03 DIAGNOSIS — I110 Hypertensive heart disease with heart failure: Secondary | ICD-10-CM | POA: Diagnosis not present

## 2023-06-03 DIAGNOSIS — Z7902 Long term (current) use of antithrombotics/antiplatelets: Secondary | ICD-10-CM | POA: Diagnosis not present

## 2023-06-03 DIAGNOSIS — M79672 Pain in left foot: Secondary | ICD-10-CM | POA: Diagnosis present

## 2023-06-03 DIAGNOSIS — Z7901 Long term (current) use of anticoagulants: Secondary | ICD-10-CM | POA: Diagnosis not present

## 2023-06-03 DIAGNOSIS — I509 Heart failure, unspecified: Secondary | ICD-10-CM | POA: Diagnosis not present

## 2023-06-03 MED ORDER — OXYCODONE-ACETAMINOPHEN 5-325 MG PO TABS
2.0000 | ORAL_TABLET | Freq: Once | ORAL | Status: AC
  Administered 2023-06-03: 2 via ORAL
  Filled 2023-06-03: qty 2

## 2023-06-03 MED ORDER — COLCHICINE 0.6 MG PO TABS
0.6000 mg | ORAL_TABLET | Freq: Once | ORAL | Status: AC
  Administered 2023-06-03: 0.6 mg via ORAL
  Filled 2023-06-03: qty 1

## 2023-06-03 MED ORDER — COLCHICINE 0.6 MG PO TABS: 0.6000 mg | ORAL_TABLET | Freq: Every day | ORAL | 0 refills | Status: DC

## 2023-06-03 MED ORDER — OXYCODONE HCL 5 MG PO TABS: 5.0000 mg | ORAL_TABLET | ORAL | 0 refills | Status: DC | PRN

## 2023-06-03 NOTE — Discharge Instructions (Addendum)
 Take the prescribed medication as directed.  Can stop colchicine once foot is improving. Follow-up with your primary care doctor. Return to the ED for new or worsening symptoms.

## 2023-06-03 NOTE — ED Triage Notes (Addendum)
 As per EMS, patient to ED with increased left foot pain/swelling due to gout in left foot. Patient has hx of same. BGL 131 en route

## 2023-06-03 NOTE — ED Provider Notes (Signed)
 [place] EMERGENCY DEPARTMENT AT [facility] Provider Note   CSN: [id number] Arrival date & time: 06/03/23  0012     History  Chief Complaint  Patient presents with   Foot Pain    Russell Collier is a 63 y.o. male.  The history is provided by the patient and medical records.  Foot Pain   63 year old male with history of hypertension, neuropathy, sleep apnea, CHF, obesity, hyperlipidemia history of PEs on Eliquis, presenting to the ED for left foot pain.  Hx of gout and feels the same.  Denies eating red meat, fish, or drinking alcohol.  States he has been eating lemon/lime as instructed by his doctor without change.  Taking daily allopurinol as well.  Denies fever/chills.  No falls/trauma to the foot.    Home Medications Prior to Admission medications   Medication Sig Start Date End Date Taking? Authorizing Provider  colchicine 0.6 MG tablet Take 1 tablet (0.6 mg total) by mouth daily. 06/03/23  Yes Garlon Hatchet, PA-C  oxyCODONE (ROXICODONE) 5 MG immediate release tablet Take 1 tablet (5 mg total) by mouth every 4 (four) hours as needed for severe pain (pain score 7-10). 06/03/23  Yes Garlon Hatchet, PA-C  acetaminophen (TYLENOL) 325 MG tablet Take 2 tablets (650 mg total) by mouth every 6 (six) hours as needed for mild pain or moderate pain. 07/13/21   Dameron, Nolberto Hanlon, DO  albuterol (PROVENTIL) (2.5 MG/3ML) 0.083% nebulizer solution Take 3 mLs (2.5 mg total) by nebulization every 6 (six) hours as needed for wheezing or shortness of breath. 11/20/20   Cresenzo, Cyndi Lennert, MD  allopurinol (ZYLOPRIM) 100 MG tablet TAKE 1 TABLET(100 MG) BY MOUTH DAILY 05/25/23   Jerre Simon, MD  apixaban (ELIQUIS) 2.5 MG TABS tablet Take 1 tablet (2.5 mg total) by mouth 2 (two) times daily. 01/17/23   [name], Bevelyn Buckles, MD  betamethasone valerate ointment (VALISONE) 0.1 % Apply 1 Application topically 2 (two) times daily. 02/28/23   Shelby Mattocks, DO  Blood Glucose Monitoring Suppl DEVI 1 each  by Does not apply route in the morning, at noon, and at bedtime. May substitute to any manufacturer covered by patient's insurance. Patient not taking: Reported on 05/06/2023 08/10/22   Valetta Close, MD  clopidogrel (PLAVIX) 75 MG tablet Take 1 tablet (75 mg total) by mouth daily. 02/28/23 02/28/24  Shelby Mattocks, DO  dapagliflozin propanediol (FARXIGA) 10 MG TABS tablet Take 1 tablet (10 mg total) by mouth daily before breakfast. 01/17/23   [name], Bevelyn Buckles, MD  enalapril (VASOTEC) 5 MG tablet Take 1 tablet (5 mg total) by mouth at bedtime. 05/02/23   Milford, Anderson Malta, FNP  fluticasone (FLONASE) 50 MCG/ACT nasal spray Place 1 spray into both nostrils daily. 02/28/23   Shelby Mattocks, DO  furosemide (LASIX) 40 MG tablet Take 1 tablet (40 mg total) by mouth daily. 05/03/23   Milford, Anderson Malta, FNP  lidocaine (LIDODERM) 5 % Place 1 patch onto the skin daily. Remove & Discard patch within 12 hours or as directed by MD 12/01/21   Nicanor Alcon, April, MD  multivitamin (ONE-A-DAY MEN'S) TABS tablet Take 1 tablet by mouth daily with breakfast.    [provider]  pantoprazole (PROTONIX) 40 MG tablet Take 1 tablet (40 mg total) by mouth daily. 02/28/23   Shelby Mattocks, DO  potassium chloride SA (KLOR-CON M) 20 MEQ tablet Take 2 tablets (40 mEq total) by mouth daily. 01/17/23   [name], Bevelyn Buckles, MD  pregabalin (LYRICA) 100 MG  capsule Take 1 capsule (100 mg total) by mouth 3 (three) times daily. 02/28/23   Shelby Mattocks, DO  rosuvastatin (CRESTOR) 10 MG tablet Take 1 tablet (10 mg total) by mouth daily. 02/28/23   Shelby Mattocks, DO  Tafamidis (VYNDAMAX) 61 MG CAPS TAKE 1 CAPSULE BY MOUTH DAILY. 05/18/23 05/17/24  [name], Bevelyn Buckles, MD  triamcinolone ointment (KENALOG) 0.5 % Apply 1 Application topically 2 (two) times daily. 02/28/23   Shelby Mattocks, DO  umeclidinium-vilanterol (ANORO ELLIPTA) 62.5-25 MCG/ACT AEPB Inhale 1 puff into the lungs daily. 02/28/23   Shelby Mattocks, DO  valACYclovir (VALTREX) 500  MG tablet Take 1 tablet (500 mg total) by mouth daily. 03/30/23 09/14/23  Jerre Simon, MD  VENTOLIN HFA 108 [phone] Base) MCG/ACT inhaler Inhale 2 puffs into the lungs every 6 (six) hours as needed for wheezing or shortness of breath. 02/28/23   Shelby Mattocks, DO  Vitamin A 2400 MCG (8000 UT) TABS Take 1 tablet by mouth daily. 05/21/21   [name], Bevelyn Buckles, MD  vutrisiran sodium (AMVUTTRA) 25 MG/0.5ML syringe Inject 0.5 mLs (25 mg total) into the skin every 3 (three) months. 05/25/23   [name], Bevelyn Buckles, MD      Allergies    Chantix [varenicline], Bupropion, Bactrim [sulfamethoxazole-trimethoprim], and Nicoderm [nicotine]    Review of Systems   Review of Systems  Musculoskeletal:  Positive for arthralgias.  All other systems reviewed and are negative.   Physical Exam Updated Vital Signs BP (!) 134/92 (BP Location: Left Arm)   Pulse 84   Temp (!) 97.2 F (36.2 C)   Resp 18   Ht 5\' 10"  (1.778 m)   Wt 125 kg   SpO2 99%   BMI 39.54 kg/m   Physical Exam Vitals and nursing note reviewed.  Constitutional:      Appearance: He is well-developed.  HENT:     Head: Normocephalic and atraumatic.  Eyes:     Conjunctiva/sclera: Conjunctivae normal.     Pupils: Pupils are equal, round, and reactive to light.  Cardiovascular:     Rate and Rhythm: Normal rate and regular rhythm.     Heart sounds: Normal heart sounds.  Pulmonary:     Effort: Pulmonary effort is normal.     Breath sounds: Normal breath sounds.  Abdominal:     General: Bowel sounds are normal.     Palpations: Abdomen is soft.  Musculoskeletal:        General: Normal range of motion.     Cervical back: Normal range of motion.     Comments: Lidocaine patch left foot-- removed. Skin is normal in appearance without overlying erythema/induration, no rashes, tender to touch along dorsal foot and into the left great toe, no excessive warmth to touch, foot is NVI  Skin:    General: Skin is warm and dry.  Neurological:     Mental  Status: He is alert and oriented to person, place, and time.     ED Results / Procedures / Treatments   Labs (all labs ordered are listed, but only abnormal results are displayed) Labs Reviewed - No data to display  EKG None  Radiology No results found.  Procedures Procedures    Medications Ordered in ED Medications  colchicine tablet 0.6 mg (0.6 mg Oral Given 06/03/23 0040)  oxyCODONE-acetaminophen (PERCOCET/ROXICET) 5-325 MG per tablet 2 tablet (2 tablets Oral Given 06/03/23 0040)    ED Course/ Medical Decision Making/ A&P  Medical Decision Making Risk Prescription drug management.   63 year old male presenting to the ED with left foot pain.  History of gout and reports this feels similar.  Denies any recent intake of red meat, fish, or alcohol.  He is afebrile and nontoxic in appearance here.  Foot is without overlying erythema or induration.  There is no significant warmth to touch but he does have exquisite tenderness to the left dorsal foot into the great toe.  His foot is neurovascular intact.  This seems consistent with likely gout.  Doubt septic joint.  He does have chronic kidney disease so we will avoid NSAIDs.  He is given colchicine and short course of oxycodone.  He can continue his daily allopurinol.  I recommended that he follow-up closely with his primary care doctor.  Final Clinical Impression(s) / ED Diagnoses Final diagnoses:  Left foot pain    Rx / DC Orders ED Discharge Orders          Ordered    oxyCODONE (ROXICODONE) 5 MG immediate release tablet  Every 4 hours PRN        06/03/23 0138    colchicine 0.6 MG tablet  Daily        06/03/23 0138              Garlon Hatchet, PA-C 06/03/23 0221    Melene Plan, DO 06/03/23 [phone]

## 2023-06-03 NOTE — ED Notes (Signed)
 Pt noted to be hollering out of room, upon entering patient is on the phone getting agitated.  Angry that he was placed in a treatment room instead of the hallway.  Also upset that he needed to yell to get anyone passing by to help and needing a urinal.  This RN attempting to apologize to patient but continuously interrupted by patient.  Pt then states that he is recording this whole conversation and will be filing a grievance to hospital administration.  Again this RN attempting to talk to patient to address concerns but patient states there isn't anything to do now.  Pt also demanding a taxi voucher be given to him because it is the hospital's responsibility to do so.  RN attempted to inform patient that it is not the hospital's responsibility to provide transportation for discharge and that EMS is not appropriate for discharge transportation.  Pt standing up and sitting self in wheelchair that was courteously brought for patient's discharge and was wheeled to lobby to wait for family to pick him up.

## 2023-06-06 ENCOUNTER — Other Ambulatory Visit (HOSPITAL_COMMUNITY): Payer: Self-pay

## 2023-06-06 DIAGNOSIS — I5032 Chronic diastolic (congestive) heart failure: Secondary | ICD-10-CM

## 2023-06-06 MED ORDER — ENALAPRIL MALEATE 5 MG PO TABS: 5.0000 mg | ORAL_TABLET | Freq: Every day | ORAL | 5 refills | Status: DC

## 2023-06-07 ENCOUNTER — Ambulatory Visit (HOSPITAL_COMMUNITY)
Admission: RE | Admit: 2023-06-07 | Discharge: 2023-06-07 | Disposition: A | Discharge status: Home / Self Care | Payer: Medicaid Other | Source: Ambulatory Visit | Attending: Cardiology | Admitting: Cardiology

## 2023-06-07 DIAGNOSIS — I5032 Chronic diastolic (congestive) heart failure: Secondary | ICD-10-CM | POA: Insufficient documentation

## 2023-06-07 DIAGNOSIS — Z86711 Personal history of pulmonary embolism: Secondary | ICD-10-CM | POA: Insufficient documentation

## 2023-06-07 DIAGNOSIS — G63 Polyneuropathy in diseases classified elsewhere: Secondary | ICD-10-CM | POA: Diagnosis not present

## 2023-06-07 DIAGNOSIS — Z86718 Personal history of other venous thrombosis and embolism: Secondary | ICD-10-CM | POA: Insufficient documentation

## 2023-06-07 DIAGNOSIS — I110 Hypertensive heart disease with heart failure: Secondary | ICD-10-CM | POA: Insufficient documentation

## 2023-06-07 DIAGNOSIS — E851 Neuropathic heredofamilial amyloidosis: Secondary | ICD-10-CM | POA: Diagnosis present

## 2023-06-07 MED ORDER — VUTRISIRAN SODIUM 25 MG/0.5ML ~~LOC~~ SOSY
25.0000 mg | PREFILLED_SYRINGE | Freq: Once | SUBCUTANEOUS | Status: AC
  Administered 2023-06-07: 25 mg via SUBCUTANEOUS

## 2023-06-07 NOTE — Progress Notes (Signed)
    Advanced [facility] Failure Clinic Note  PCP: Russell Simon, MD PCP-Cardiologist: Russell Magic, MD  Neurology: Russell Collier HF Cardiology: Russell Collier  HPI:  Russell Collier is a 63 y.o. 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    Echo 01/2018, EF 55-60%, nonobstructive cardiac cath 01/2018 (20% prox LAD, 60% mid Cx, 50% post atrio lesion) h/o bradycardia w/ ? 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    PYP scan 09/2018 and was an equivocal study. No visual increase in PYP uptake, but ratio was between 1-1.5. Based clinical suspicion, he was ordered to undergo genetic testing and results + for TTR gene.   He was seen by neurology, neuropathy associated with TTR.    Admitted 07/27/19 with atypical chest pain and wheezing. Troponins were low and flat, EKG reassuring. A CTA was performed, which showed a possible segmental PE. Since he does have a history of PE, he was continued on IV heparin and transitioned to Eliquis on 07/28/19. Echo 07/27/19 EF 55% RV normal.    At his HF follow up 12/2019 Eliquis was decreased due to frequent nose bleeds, mobility limited by back and knee pain.   S/p L3-S1 decompression 03/2020, uneventful hospitalization at [facility].   Graduated from paramedicine 09/2020.   Follow up with Neurology 03/2021, recommended starting Amvuttra and recommended restarting PT.   Echo 04/2021 EF 50-55% RV ok    Admitted 5/18-22/23 for RLE cellulitis. S/p RLE angiography with CO2, no intervention. New wound on LLE, and underwent LLE angiography with balloon angioplasty of left peroneal artery. Had LLE wound which is now healed.    At HF follow up 01/2022 Imdur discontinued and enalapril decreased to 10  mg nightly due to orthostasis.  Today he returns to HF clinic for injection of Amvuttra. Overall feeling well today. No contraindications to injection. Amvuttra administered in left arm. Patient prefers left arm as he will have significant injection site pain for 5-10 minutes after injection.   Assessment/Plan: 1. Familial TTR Cardiac Amyloidosis:  - PYP scan 09/2018 was equivocal. + genetic testing. Has TTR gene, heterozygous.  - He is on tafamadis and referred for family genetic testing. No family members with TTR.  - Based on PYP scan not thought to have extensive cardiac involvement at this point.  - He has numerous neuropathic symptoms. Followed by Russell. Nita Sickle in Neurology for neuropathy - Continue Tafamidis. 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 Patient noted significant pain at the injection site after injection was administered today. Pain improved by the end of the visit. He will take tylenol to help with the pain. Provided patient counseling on Amvuttra. Most common side effects are injection site reactions, arthralgias, dyspnea and vitamin A deficiency. Patient is aware to return to clinic every 3 months for repeat injection.   - Continue vitamin A supplement, at least 3000 IU daily. Amvuttra decreases serum vitamin A levels.   Follow up 3 months for repeat Amvuttra injection.   Russell Collier, PharmD, BCPS, BCCP, CPP Heart Failure Clinic Pharmacist [phone]

## 2023-06-07 NOTE — Patient Instructions (Signed)
It was a pleasure seeing you today!  MEDICATIONS: -No medication changes today -Call if you have questions about your medications.   NEXT APPOINTMENT: Return to clinic in 3 months for repeat injection.  In general, to take care of your heart failure: -Limit your fluid intake to 2 Liters (half-gallon) per day.   -Limit your salt intake to ideally 2-3 grams (2000-3000 mg) per day. -Weigh yourself daily and record, and bring that "weight diary" to your next appointment.  (Weight gain of 2-3 pounds in 1 day typically means fluid weight.) -The medications for your heart are to help your heart and help you live longer.   -Please contact us before stopping any of your heart medications.  Call the clinic at [phone] with questions or to reschedule future appointments.

## 2023-06-08 ENCOUNTER — Other Ambulatory Visit: Payer: Self-pay

## 2023-06-08 ENCOUNTER — Other Ambulatory Visit (HOSPITAL_COMMUNITY): Payer: Self-pay

## 2023-06-08 NOTE — Progress Notes (Signed)
 Specialty Pharmacy Refill Coordination Note  Russell Collier is a 63 y.o. male contacted today regarding refills of specialty medication(s) Tafamidis (Vyndamax)   Patient requested Delivery   Delivery date: 06/13/23   Verified address: [address]   [place] [address]   Medication will be filled on 06/10/23.

## 2023-06-09 ENCOUNTER — Ambulatory Visit (INDEPENDENT_AMBULATORY_CARE_PROVIDER_SITE_OTHER): Payer: Medicaid Other | Admitting: Podiatry

## 2023-06-09 ENCOUNTER — Encounter: Payer: Self-pay | Admitting: Podiatry

## 2023-06-09 DIAGNOSIS — M79675 Pain in left toe(s): Secondary | ICD-10-CM

## 2023-06-09 DIAGNOSIS — M79674 Pain in right toe(s): Secondary | ICD-10-CM

## 2023-06-09 DIAGNOSIS — B351 Tinea unguium: Secondary | ICD-10-CM | POA: Diagnosis not present

## 2023-06-09 DIAGNOSIS — E1151 Type 2 diabetes mellitus with diabetic peripheral angiopathy without gangrene: Secondary | ICD-10-CM
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
## 2023-06-10 ENCOUNTER — Telehealth (HOSPITAL_COMMUNITY): Payer: Self-pay

## 2023-06-10 ENCOUNTER — Other Ambulatory Visit: Payer: Self-pay

## 2023-06-10 NOTE — Telephone Encounter (Signed)
 Called to confirm/remind patient of their appointment at the [facility] on 06/13/23.   Appointment:   [x] Confirmed  [] Left mess   [] No answer/No voice mail  [] VM Full/unable to leave message  [] Phone not in service  Patient reminded to bring all medications and/or complete list.  Confirmed patient has transportation. Gave directions, instructed to utilize valet parking.

## 2023-06-13 ENCOUNTER — Ambulatory Visit (HOSPITAL_COMMUNITY)
Admission: RE | Admit: 2023-06-13 | Discharge: 2023-06-13 | Disposition: A | Discharge status: Home / Self Care | Source: Ambulatory Visit | Attending: Family Medicine | Admitting: Family Medicine

## 2023-06-13 ENCOUNTER — Encounter (HOSPITAL_COMMUNITY): Payer: Self-pay

## 2023-06-13 VITALS — BP 106/69 | HR 76 | Wt 268.6 lb

## 2023-06-13 DIAGNOSIS — I2510 Atherosclerotic heart disease of native coronary artery without angina pectoris: Secondary | ICD-10-CM | POA: Insufficient documentation

## 2023-06-13 DIAGNOSIS — F17290 Nicotine dependence, other tobacco product, uncomplicated: Secondary | ICD-10-CM | POA: Insufficient documentation

## 2023-06-13 DIAGNOSIS — Z7902 Long term (current) use of antithrombotics/antiplatelets: Secondary | ICD-10-CM | POA: Insufficient documentation

## 2023-06-13 DIAGNOSIS — Z955 Presence of coronary angioplasty implant and graft: Secondary | ICD-10-CM | POA: Insufficient documentation

## 2023-06-13 DIAGNOSIS — I10 Essential (primary) hypertension: Secondary | ICD-10-CM | POA: Diagnosis present

## 2023-06-13 DIAGNOSIS — E1151 Type 2 diabetes mellitus with diabetic peripheral angiopathy without gangrene: Secondary | ICD-10-CM | POA: Insufficient documentation

## 2023-06-13 DIAGNOSIS — Z6838 Body mass index (BMI) 38.0-38.9, adult: Secondary | ICD-10-CM | POA: Insufficient documentation

## 2023-06-13 DIAGNOSIS — I739 Peripheral vascular disease, unspecified: Secondary | ICD-10-CM

## 2023-06-13 DIAGNOSIS — I5032 Chronic diastolic (congestive) heart failure: Secondary | ICD-10-CM

## 2023-06-13 DIAGNOSIS — I509 Heart failure, unspecified: Secondary | ICD-10-CM | POA: Diagnosis present

## 2023-06-13 DIAGNOSIS — Z720 Tobacco use: Secondary | ICD-10-CM

## 2023-06-13 DIAGNOSIS — R002 Palpitations: Secondary | ICD-10-CM | POA: Diagnosis not present

## 2023-06-13 DIAGNOSIS — E854 Organ-limited amyloidosis: Secondary | ICD-10-CM | POA: Diagnosis not present

## 2023-06-13 DIAGNOSIS — E119 Type 2 diabetes mellitus without complications: Secondary | ICD-10-CM

## 2023-06-13 DIAGNOSIS — I110 Hypertensive heart disease with heart failure: Secondary | ICD-10-CM | POA: Diagnosis not present

## 2023-06-13 DIAGNOSIS — E851 Neuropathic heredofamilial amyloidosis: Secondary | ICD-10-CM | POA: Diagnosis not present

## 2023-06-13 DIAGNOSIS — R2681 Unsteadiness on feet: Secondary | ICD-10-CM | POA: Insufficient documentation

## 2023-06-13 DIAGNOSIS — G4733 Obstructive sleep apnea (adult) (pediatric): Secondary | ICD-10-CM

## 2023-06-13 DIAGNOSIS — I25110 Atherosclerotic heart disease of native coronary artery with unstable angina pectoris: Secondary | ICD-10-CM | POA: Diagnosis not present

## 2023-06-13 DIAGNOSIS — G63 Polyneuropathy in diseases classified elsewhere: Secondary | ICD-10-CM

## 2023-06-13 DIAGNOSIS — I43 Cardiomyopathy in diseases classified elsewhere: Secondary | ICD-10-CM | POA: Diagnosis not present

## 2023-06-13 DIAGNOSIS — Z7901 Long term (current) use of anticoagulants: Secondary | ICD-10-CM | POA: Insufficient documentation

## 2023-06-13 DIAGNOSIS — Z86718 Personal history of other venous thrombosis and embolism: Secondary | ICD-10-CM

## 2023-06-13 LAB — BASIC METABOLIC PANEL WITH GFR
Anion gap: 10 (ref 5–15)
BUN: 30 mg/dL — ABNORMAL HIGH (ref 8–23)
CO2: 24 mmol/L (ref 22–32)
Calcium: 9.4 mg/dL (ref 8.9–10.3)
Chloride: 103 mmol/L (ref 98–111)
Creatinine, Ser: 1.5 mg/dL — ABNORMAL HIGH (ref 0.61–1.24)
GFR, Estimated: 51 mL/min — ABNORMAL LOW (ref >60)
Glucose, Bld: 91 mg/dL (ref 70–99)
Potassium: 4.6 mmol/L (ref 3.5–5.1)
Sodium: 137 mmol/L (ref 135–145)

## 2023-06-13 MED ORDER — NICOTINE 7 MG/24HR TD PT24: 7.0000 mg | MEDICATED_PATCH | Freq: Every day | TRANSDERMAL | 0 refills | Status: AC

## 2023-06-13 MED ORDER — NICOTINE 14 MG/24HR TD PT24: 14.0000 mg | MEDICATED_PATCH | Freq: Every day | TRANSDERMAL | 0 refills | Status: AC

## 2023-06-13 MED ORDER — NICOTINE 21 MG/24HR TD PT24: 21.0000 mg | MEDICATED_PATCH | Freq: Every day | TRANSDERMAL | 0 refills | Status: AC
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
## 2023-06-13 NOTE — Patient Instructions (Addendum)
 Thank you for coming in today  If you had labs drawn today, any labs that are abnormal the clinic will call you No news is good news  Medications: Place 1 patch (21 mg total) onto the skin daily. For 6 weeks Then Place 1 patch (14 mg total) onto the skin daily for 14 days  Then Place 1 patch (7 mg total) onto the skin daily. 14 days    Please see dates on prescriptions on when to decrease dosage strength   Follow up appointments:  Your physician recommends that you schedule a follow-up appointment in:  4 months With Dr. Julane Ny  Please call our office to schedule the follow-up appointment in June/July for August 2025.    Do the following things EVERYDAY: Weigh yourself in the morning before breakfast. Write it down and keep it in a log. Take your medicines as prescribed Eat low salt foods--Limit salt (sodium) to 2000 mg per day.  Stay as active as you can everyday Limit all fluids for the day to less than 2 liters   At the [facility], you and your health needs are our priority. As part of our continuing mission to provide you with exceptional heart care, we have created designated Provider Care Teams. These Care Teams include your primary Cardiologist (physician) and Advanced Practice Providers (APPs- Physician Assistants and Nurse Practitioners) who all work together to provide you with the care you need, when you need it.   You may see any of the following providers on your designated Care Team at your next follow up: Dr Jules Oar Dr Peder Bourdon Dr. Mimi Alt, NP Ruddy Corral, [place] [facility] [place], [place] Dennise Fitz, NP Luster Salters, PharmD   Please be sure to bring in all your medications bottles to every appointment.    Thank you for choosing [facility]  If you have any questions or concerns before your next appointment please send us  a message through [place] or  call our office at [phone].    TO LEAVE A MESSAGE FOR THE NURSE SELECT OPTION 2, PLEASE LEAVE A MESSAGE INCLUDING: YOUR NAME DATE OF BIRTH CALL BACK NUMBER REASON FOR CALL**this is important as we prioritize the call backs  YOU WILL RECEIVE A CALL BACK THE SAME DAY AS LONG AS YOU CALL BEFORE 4:00 PM

## 2023-06-14 ENCOUNTER — Telehealth (HOSPITAL_COMMUNITY): Payer: Self-pay | Admitting: *Deleted

## 2023-06-14 DIAGNOSIS — I5032 Chronic diastolic (congestive) heart failure: Secondary | ICD-10-CM

## 2023-06-14 MED ORDER — FUROSEMIDE 40 MG PO TABS: ORAL_TABLET | ORAL | 3 refills | Status: DC

## 2023-06-14 NOTE — Telephone Encounter (Signed)
 Called patient per Vernia Good, NP with following:  "Renal function up a bit. Decrease Lasix  to 40 mg daily alternating with 20 mg every other day  Repeat  BMET in 2 weeks"  Pt verbalized understanding of same. Medication list updated, repeat lab ordered and scheduled.

## 2023-06-15 ENCOUNTER — Other Ambulatory Visit (HOSPITAL_COMMUNITY): Payer: Self-pay

## 2023-06-16 ENCOUNTER — Other Ambulatory Visit: Payer: Self-pay

## 2023-06-16 ENCOUNTER — Encounter (HOSPITAL_BASED_OUTPATIENT_CLINIC_OR_DEPARTMENT_OTHER): Payer: Self-pay | Admitting: Physical Therapy

## 2023-06-16 ENCOUNTER — Ambulatory Visit (HOSPITAL_BASED_OUTPATIENT_CLINIC_OR_DEPARTMENT_OTHER): Attending: Family Medicine | Admitting: Physical Therapy

## 2023-06-16 DIAGNOSIS — M79672 Pain in left foot: Secondary | ICD-10-CM | POA: Insufficient documentation

## 2023-06-16 DIAGNOSIS — M79671 Pain in right foot: Secondary | ICD-10-CM | POA: Diagnosis present

## 2023-06-16 DIAGNOSIS — R262 Difficulty in walking, not elsewhere classified: Secondary | ICD-10-CM | POA: Diagnosis present
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
## 2023-06-22 ENCOUNTER — Encounter (HOSPITAL_COMMUNITY): Payer: Medicaid Other

## 2023-06-22 ENCOUNTER — Ambulatory Visit: Payer: Medicaid Other

## 2023-06-23 ENCOUNTER — Ambulatory Visit (HOSPITAL_BASED_OUTPATIENT_CLINIC_OR_DEPARTMENT_OTHER): Attending: Family Medicine | Admitting: Physical Therapy

## 2023-06-23 ENCOUNTER — Encounter (HOSPITAL_BASED_OUTPATIENT_CLINIC_OR_DEPARTMENT_OTHER): Payer: Self-pay | Admitting: Physical Therapy

## 2023-06-23 DIAGNOSIS — R262 Difficulty in walking, not elsewhere classified: Secondary | ICD-10-CM | POA: Insufficient documentation

## 2023-06-23 DIAGNOSIS — M79672 Pain in left foot: Secondary | ICD-10-CM | POA: Insufficient documentation

## 2023-06-23 DIAGNOSIS — M79671 Pain in right foot: Secondary | ICD-10-CM | POA: Diagnosis present
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
## 2023-06-28 ENCOUNTER — Ambulatory Visit (HOSPITAL_COMMUNITY)
Admission: RE | Admit: 2023-06-28 | Discharge: 2023-06-28 | Disposition: A | Discharge status: Home / Self Care | Source: Ambulatory Visit | Attending: Internal Medicine | Admitting: Internal Medicine

## 2023-06-28 DIAGNOSIS — I5032 Chronic diastolic (congestive) heart failure: Secondary | ICD-10-CM | POA: Insufficient documentation

## 2023-06-28 LAB — BASIC METABOLIC PANEL WITH GFR
Anion gap: 12 (ref 5–15)
BUN: 15 mg/dL (ref 8–23)
CO2: 23 mmol/L (ref 22–32)
Calcium: 9.4 mg/dL (ref 8.9–10.3)
Chloride: 105 mmol/L (ref 98–111)
Creatinine, Ser: 1.26 mg/dL — ABNORMAL HIGH (ref 0.61–1.24)
GFR, Estimated: >60 mL/min (ref >60)
Glucose, Bld: 92 mg/dL (ref 70–99)
Potassium: 4.3 mmol/L (ref 3.5–5.1)
Sodium: 140 mmol/L (ref 135–145)

## 2023-06-30 ENCOUNTER — Ambulatory Visit (HOSPITAL_BASED_OUTPATIENT_CLINIC_OR_DEPARTMENT_OTHER): Admitting: Physical Therapy

## 2023-06-30 ENCOUNTER — Encounter (HOSPITAL_BASED_OUTPATIENT_CLINIC_OR_DEPARTMENT_OTHER): Payer: Self-pay | Admitting: Physical Therapy

## 2023-06-30 DIAGNOSIS — R262 Difficulty in walking, not elsewhere classified: Secondary | ICD-10-CM

## 2023-06-30 DIAGNOSIS — M79672 Pain in left foot: Secondary | ICD-10-CM

## 2023-06-30 DIAGNOSIS — M79671 Pain in right foot: Secondary | ICD-10-CM | POA: Diagnosis not present
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
## 2023-07-05 ENCOUNTER — Other Ambulatory Visit: Payer: Self-pay

## 2023-07-05 ENCOUNTER — Other Ambulatory Visit: Payer: Self-pay | Admitting: Pharmacy Technician

## 2023-07-05 NOTE — Progress Notes (Signed)
 Specialty Pharmacy Refill Coordination Note  Russell Collier is a 63 y.o. male contacted today regarding refills of specialty medication(s) Tafamidis  (Vyndamax )   Patient requested Delivery   Delivery date: 07/13/23   Verified address: [address] [place]   Medication will be filled on 07/12/23.

## 2023-07-06 ENCOUNTER — Ambulatory Visit: Payer: Self-pay

## 2023-07-06 NOTE — Patient Instructions (Signed)
 Visit Information  Thank you for taking time to visit with me today. Please don't hesitate to contact me if I can be of assistance to you before our next scheduled appointment.  Your next care management appointment is scheduled for:  08/09/23  230 pm    Please call the care guide team at [phone] if you need to cancel, schedule, or reschedule an appointment.   Please call [phone]-TALK (toll free, 24 hour hotline) if you are experiencing a Mental Health or Behavioral Health Crisis or need someone to talk to.  Augustin Leber RN, BSN, [facility] [place]  [facility], [facility]  Care Coordinator Phone: [phone]
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
## 2023-07-07 ENCOUNTER — Encounter (HOSPITAL_BASED_OUTPATIENT_CLINIC_OR_DEPARTMENT_OTHER): Admitting: Physical Therapy

## 2023-07-12 ENCOUNTER — Other Ambulatory Visit: Payer: Self-pay

## 2023-07-12 ENCOUNTER — Other Ambulatory Visit (HOSPITAL_COMMUNITY): Payer: Self-pay

## 2023-07-13 ENCOUNTER — Telehealth: Payer: Self-pay

## 2023-07-13 ENCOUNTER — Ambulatory Visit (HOSPITAL_COMMUNITY): Payer: Self-pay

## 2023-07-13 ENCOUNTER — Ambulatory Visit
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
## 2023-07-14 ENCOUNTER — Other Ambulatory Visit (HOSPITAL_COMMUNITY): Payer: Self-pay | Admitting: Internal Medicine

## 2023-07-14 DIAGNOSIS — I5032 Chronic diastolic (congestive) heart failure: Secondary | ICD-10-CM

## 2023-07-14 DIAGNOSIS — I10 Essential (primary) hypertension: Secondary | ICD-10-CM

## 2023-07-14 DIAGNOSIS — G4733 Obstructive sleep apnea (adult) (pediatric): Secondary | ICD-10-CM

## 2023-07-14 DIAGNOSIS — I5030 Unspecified diastolic (congestive) heart failure: Secondary | ICD-10-CM

## 2023-07-25 ENCOUNTER — Other Ambulatory Visit: Payer: Self-pay

## 2023-07-25 ENCOUNTER — Ambulatory Visit (INDEPENDENT_AMBULATORY_CARE_PROVIDER_SITE_OTHER): Admitting: Student

## 2023-07-25 ENCOUNTER — Encounter: Payer: Self-pay | Admitting: Student

## 2023-07-25 VITALS — BP 131/78 | HR 91 | Ht 70.0 in | Wt 258.0 lb

## 2023-07-25 DIAGNOSIS — I739 Peripheral vascular disease, unspecified: Secondary | ICD-10-CM

## 2023-07-25 DIAGNOSIS — E1151 Type 2 diabetes mellitus with diabetic peripheral angiopathy without gangrene: Secondary | ICD-10-CM | POA: Diagnosis present

## 2023-07-25 DIAGNOSIS — Z1211 Encounter for screening for malignant neoplasm of colon: Secondary | ICD-10-CM | POA: Diagnosis not present

## 2023-07-25 LAB — POCT GLYCOSYLATED HEMOGLOBIN (HGB A1C): HbA1c, POC (controlled diabetic range): 5.7 % (ref 0.0–7.0)

## 2023-07-25 NOTE — Progress Notes (Cosign Needed Addendum)
    SUBJECTIVE:   CHIEF COMPLAINT / HPI:   63 year old male with history of T2DM presenting for diabetes follow-up Currently on Farxiga  10 mg daily Other medication include Crestor  10 mg daily, enalapril  10 mg daily. Last A1c 4 months ago was was  6.0% Due for annual eye exam and diabetes kidney evaluation.  PERTINENT  PMH / PSH: Reviewed   OBJECTIVE:   BP 131/78   Pulse 91   Ht 5\' 10"  (1.778 m)   Wt 258 lb (117 kg)   SpO2 98%   BMI 37.02 kg/m    Physical Exam General: Alert, well appearing, NAD Cardiovascular: RRR, No Murmurs, Normal S2/S2 Respiratory: CTAB, No wheezing or Rales Abdomen: No distension or tenderness Extremities: No edema on extremities     ASSESSMENT/PLAN:   Type 2 diabetes mellitus with diabetic peripheral angiopathy without gangrene, without long-term current use of insulin  (HCC) A1c today well-controlled at 5.7%.  Endorses good tolerance with his Farxiga . - Obtained A1c, microalbuminuria - Advised to complete his annual DM eye exam - Encouraged to continue working on diet and creased activities   Health maintenance Patient due for colonoscopy. - GI referral placed for colonoscopy.  Goble Last, MD [facility]

## 2023-07-25 NOTE — Patient Instructions (Signed)
 Pleasure to see you today.  Your A1c today improved to 5.7% from 3.0% 4 months ago.  Please continue to take your medications as prescribed.  You are currently due for your annual eye exam please make sure to see an ophthalmologist/optometrist to have this completed and results sent to us .  Got your urine sample today to test for your kidney function.

## 2023-07-25 NOTE — Assessment & Plan Note (Addendum)
 A1c today well-controlled at 5.7%.  Endorses good tolerance with his Farxiga . - Obtained A1c, microalbuminuria - Advised to complete his annual DM eye exam - Encouraged to continue working on diet and increased activities

## 2023-07-27 LAB — MICROALBUMIN / CREATININE URINE RATIO
Creatinine, Urine: 63.3 mg/dL
Microalb/Creat Ratio: <5 mg/g{creat} (ref 0–29)
Microalbumin, Urine: <3 ug/mL

## 2023-08-05 ENCOUNTER — Other Ambulatory Visit: Payer: Self-pay

## 2023-08-06 ENCOUNTER — Emergency Department (HOSPITAL_COMMUNITY)

## 2023-08-06 ENCOUNTER — Emergency Department (HOSPITAL_COMMUNITY)
Admission: EM | Admit: 2023-08-06 | Discharge: 2023-08-06 | Disposition: A | Discharge status: Home / Self Care | Attending: Emergency Medicine | Admitting: Emergency Medicine

## 2023-08-06 ENCOUNTER — Other Ambulatory Visit: Payer: Self-pay

## 2023-08-06 ENCOUNTER — Encounter (HOSPITAL_COMMUNITY): Payer: Self-pay

## 2023-08-06 DIAGNOSIS — R7989 Other specified abnormal findings of blood chemistry: Secondary | ICD-10-CM | POA: Insufficient documentation

## 2023-08-06 DIAGNOSIS — I5032 Chronic diastolic (congestive) heart failure: Secondary | ICD-10-CM | POA: Insufficient documentation

## 2023-08-06 DIAGNOSIS — N289 Disorder of kidney and ureter, unspecified: Secondary | ICD-10-CM | POA: Insufficient documentation

## 2023-08-06 DIAGNOSIS — I110 Hypertensive heart disease with heart failure: Secondary | ICD-10-CM | POA: Insufficient documentation

## 2023-08-06 DIAGNOSIS — I2510 Atherosclerotic heart disease of native coronary artery without angina pectoris: Secondary | ICD-10-CM | POA: Insufficient documentation

## 2023-08-06 DIAGNOSIS — Z720 Tobacco use: Secondary | ICD-10-CM | POA: Insufficient documentation

## 2023-08-06 DIAGNOSIS — R0789 Other chest pain: Secondary | ICD-10-CM | POA: Diagnosis not present

## 2023-08-06 DIAGNOSIS — R079 Chest pain, unspecified: Secondary | ICD-10-CM | POA: Diagnosis present

## 2023-08-06 LAB — BASIC METABOLIC PANEL WITH GFR
Anion gap: 10 (ref 5–15)
BUN: 26 mg/dL — ABNORMAL HIGH (ref 8–23)
CO2: 23 mmol/L (ref 22–32)
Calcium: 9.2 mg/dL (ref 8.9–10.3)
Chloride: 106 mmol/L (ref 98–111)
Creatinine, Ser: 1.29 mg/dL — ABNORMAL HIGH (ref 0.61–1.24)
GFR, Estimated: >60 mL/min (ref >60)
Glucose, Bld: 104 mg/dL — ABNORMAL HIGH (ref 70–99)
Potassium: 4.1 mmol/L (ref 3.5–5.1)
Sodium: 139 mmol/L (ref 135–145)

## 2023-08-06 LAB — CBC
HCT: 43.4 % (ref 39.0–52.0)
Hemoglobin: 14.4 g/dL (ref 13.0–17.0)
MCH: 30.4 pg (ref 26.0–34.0)
MCHC: 33.2 g/dL (ref 30.0–36.0)
MCV: 91.6 fL (ref 80.0–100.0)
Platelets: 215 10*3/uL (ref 150–400)
RBC: 4.74 MIL/uL (ref 4.22–5.81)
RDW: 16.9 % — ABNORMAL HIGH (ref 11.5–15.5)
WBC: 8.4 10*3/uL (ref 4.0–10.5)
nRBC: 0 % (ref 0.0–0.2)

## 2023-08-06 LAB — HEPATIC FUNCTION PANEL
ALT: 29 U/L (ref 0–44)
AST: 25 U/L (ref 15–41)
Albumin: 4 g/dL (ref 3.5–5.0)
Alkaline Phosphatase: 65 U/L (ref 38–126)
Bilirubin, Direct: <0.1 mg/dL (ref 0.0–0.2)
Total Bilirubin: 0.6 mg/dL (ref 0.0–1.2)
Total Protein: 7.6 g/dL (ref 6.5–8.1)

## 2023-08-06 LAB — LIPASE, BLOOD: Lipase: 39 U/L (ref 11–51)

## 2023-08-06 LAB — TROPONIN I (HIGH SENSITIVITY)
Troponin I (High Sensitivity): 20 ng/L — ABNORMAL HIGH (ref <18)
Troponin I (High Sensitivity): 20 ng/L — ABNORMAL HIGH (ref <18)
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
## 2023-08-06 NOTE — ED Notes (Signed)
 CCMD notified via phone.

## 2023-08-06 NOTE — Discharge Instructions (Signed)

## 2023-08-06 NOTE — ED Notes (Signed)
Got patient into a gown on the monitor did EKG shown to Dr Laverta Baltimore patient is resting with call bell in reach

## 2023-08-06 NOTE — ED Triage Notes (Addendum)
 Pt bib [facility] from home for chest pain that started a week ago that was mild and would come and go. Pt reports pain is different today and feels like stabbing in chest and is 7/10 , pain does not radiate. Pt also reports dizziness.   Pt reports he has stents placed a couple of years ago.   HX: HTN, cellulitis, gout, cardiac stent placements, diabetes    CBG 101 324 MG ASA given 140/70 71 RA 98% Pt is on eliquix and clopidogrel 

## 2023-08-06 NOTE — ED Notes (Signed)
 CCMD notifed. Pt is on monitor.

## 2023-08-09 ENCOUNTER — Other Ambulatory Visit: Payer: Self-pay
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
## 2023-08-09 NOTE — Patient Instructions (Signed)
 Visit Information  Thank you for taking time to visit with me today. Please don't hesitate to contact me if I can be of assistance to you before our next scheduled appointment.  Your next care management appointment is by telephone on 09/08/23 at 2154 pm  Telephone follow-up in 1 month  Please call the care guide team at [phone] if you need to cancel, schedule, or reschedule an appointment.   Please call [phone]-TALK (toll free, 24 hour hotline) call 911 if you are experiencing a Mental Health or Behavioral Health Crisis or need someone to talk to.  Augustin Leber RN, BSN, [facility]  [facility], [facility] [facility]   Care Coordinator Phone: [phone]

## 2023-08-10 ENCOUNTER — Ambulatory Visit (HOSPITAL_COMMUNITY)
Admission: RE | Admit: 2023-08-10 | Discharge: 2023-08-10 | Disposition: A | Discharge status: Home / Self Care | Source: Ambulatory Visit | Attending: Vascular Surgery

## 2023-08-10 ENCOUNTER — Ambulatory Visit (HOSPITAL_COMMUNITY)
Admission: RE | Admit: 2023-08-10 | Discharge: 2023-08-10 | Disposition: A | Discharge status: Home / Self Care | Source: Ambulatory Visit | Attending: Vascular Surgery | Admitting: Vascular Surgery

## 2023-08-10 ENCOUNTER — Ambulatory Visit (INDEPENDENT_AMBULATORY_CARE_PROVIDER_SITE_OTHER)

## 2023-08-10 ENCOUNTER — Other Ambulatory Visit: Payer: Self-pay

## 2023-08-10 VITALS — BP 111/72 | HR 65 | Temp 98.0°F | Ht 70.0 in | Wt 263.0 lb

## 2023-08-10 DIAGNOSIS — I872 Venous insufficiency (chronic) (peripheral): Secondary | ICD-10-CM | POA: Diagnosis present

## 2023-08-10 DIAGNOSIS — I739 Peripheral vascular disease, unspecified: Secondary | ICD-10-CM | POA: Diagnosis present

## 2023-08-10 LAB — VAS US ABI WITH/WO TBI
Left ABI: 0.65
Right ABI: 0.74
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
## 2023-08-11 ENCOUNTER — Other Ambulatory Visit: Payer: Self-pay

## 2023-08-11 ENCOUNTER — Other Ambulatory Visit: Payer: Self-pay | Admitting: Pharmacy Technician

## 2023-08-11 ENCOUNTER — Other Ambulatory Visit: Payer: Self-pay | Admitting: *Deleted

## 2023-08-11 DIAGNOSIS — I739 Peripheral vascular disease, unspecified: Secondary | ICD-10-CM

## 2023-08-11 DIAGNOSIS — I70222 Atherosclerosis of native arteries of extremities with rest pain, left leg: Secondary | ICD-10-CM

## 2023-08-11 NOTE — Progress Notes (Signed)
 Specialty Pharmacy Refill Coordination Note  Russell Collier is a 63 y.o. male contacted today regarding refills of specialty medication(s) Tafamidis  (Vyndamax )   Patient requested Delivery   Delivery date: 08/18/23   Verified address: [address]  [place]   Medication will be filled on 08/17/23.

## 2023-08-12 ENCOUNTER — Encounter (HOSPITAL_COMMUNITY): Payer: Medicaid Other | Admitting: Cardiology

## 2023-08-16 ENCOUNTER — Other Ambulatory Visit: Payer: Self-pay

## 2023-08-16 NOTE — Progress Notes (Signed)
 Specialty Pharmacy Refill Coordination Note  Russell Collier is a 63 y.o. male assessed today regarding refills of clinic administered specialty medication(s) Vutrisiran  Sodium (Amvuttra )   Clinic requested Courier to Provider Office   Delivery date: 09/01/23   Injection date: 09/06/23  Verified address: [facility]  [address] [place],  [place] [address]   Medication will be filled on 08/31/23.

## 2023-08-17 ENCOUNTER — Other Ambulatory Visit: Payer: Self-pay

## 2023-08-30 ENCOUNTER — Other Ambulatory Visit (HOSPITAL_COMMUNITY): Payer: Self-pay

## 2023-08-31 ENCOUNTER — Other Ambulatory Visit: Payer: Self-pay

## 2023-09-05 NOTE — Progress Notes (Incomplete)
 *** In Progress ***    Advanced Heart Failure Clinic Note  PCP: Rosendo Rush, MD PCP-Cardiologist: Wilbert Bihari, MD  Neurology: Dr Tobie HF Cardiology: Dr. Cherrie  HPI:  Russell Collier is a 63 y.o. 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    Echo 01/2018, EF 55-60%, nonobstructive cardiac cath 01/2018 (20% prox LAD, 60% mid Cx, 50% post atrio lesion) h/o bradycardia w/ ? 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    PYP scan 09/2018 and was an equivocal study. No visual increase in PYP uptake, but ratio was between 1-1.5. Based clinical suspicion, he was ordered to undergo genetic testing and results + for TTR gene.   He was seen by neurology, neuropathy associated with TTR.    Admitted 07/27/19 with atypical chest pain and wheezing. Troponins were low and flat, EKG reassuring. A CTA was performed, which showed a possible segmental PE. Since he does have a history of PE, he was continued on IV heparin  and transitioned to Eliquis  on 07/28/19. Echo 07/27/19 EF 55% RV normal.    At his HF follow up 12/2019 Eliquis  was decreased due to frequent nose bleeds, mobility limited by back and knee pain.   S/p L3-S1 decompression 03/2020, uneventful hospitalization at [facility].   Graduated from paramedicine 09/2020.   Follow up with Neurology 03/2021, recommended starting Amvuttra  and recommended restarting PT.   Echo 04/2021 EF 50-55% RV ok    Admitted 5/18-22/23 for RLE cellulitis. S/p RLE angiography with CO2, no intervention. New wound on LLE, and underwent LLE angiography with balloon angioplasty of left peroneal artery. Had LLE wound which is now healed.    At HF follow up 01/2022 Imdur  discontinued and  enalapril  decreased to 10 mg nightly due to orthostasis.  On 06/07/2023, he returned to HF clinic for for injection of Amvuttra . Overall felt well. There were no contraindications to injection. Amvuttra  administered in left arm. Patient prefers left arm as he will have significant injection site pain for 5-10 minutes after injection.  On 06/13/2023 he had a follow up HF clinic visit with APP. He reported feeling overall fine. He reported struggles with gait instability, no recent falls. Walked with a cane and was working with PT. He denied SOB walking on flat ground, denied SOB walking up 21 steps to get to his room. Said his legs were swelling some. Denied palpitations, abnormal bleeding, CP, or PND/Orthopnea. Appetite was ok. No fever or chills. He was not weighing at home. Reported taking all medications. Was not wearing CPAP, and reported smoking 3/4 pack of cigars/day, rare ETOH, no drugs. Was planning to get back to water aerobics after he finished PT.   On 08/06/2023 he presented to the ED with chest pain. He described the chest pain as brief, sharp/stabbing pain to the left chest, with pain lasting 1-2 seconds at a time. CXR was unremarkable. He started to feel better and was eventually discharged without admission.   Today he returns to HF clinic for Amvuttra  injection.   Overall feeling ***. Dizziness, lightheadedness, fatigue:  Chest pain or palpitations:  How is your breathing?: *** SOB: Able to complete all ADLs. Activity level ***  Weight at home pounds. Takes furosemide /torsemide/bumex *** mg *** daily.  LEE PND/Orthopnea  Appetite *** Low-salt diet:   Physical Exam Cost/affordability of meds    Assessment/Plan: 1. Familial TTR Cardiac Amyloidosis:  - PYP scan 09/2018 was equivocal. + genetic testing. Has TTR gene, heterozygous.  - He is on tafamadis and referred for family genetic testing. No family members with TTR.  - Based on PYP scan not thought to have extensive  cardiac involvement at this point.  - He has numerous neuropathic symptoms. Followed by Dr. Tonita Blanch in Neurology for neuropathy - Continue Tafamidis . 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 Patient noted significant pain at the injection site after injection was administered today. Pain improved by the end of the visit. He will take tylenol  to help with the pain. Provided patient counseling on Amvuttra . Most common side effects are injection site reactions, arthralgias, dyspnea and vitamin A  deficiency. Patient is aware to return to clinic every 3 months for repeat injection.   - Continue vitamin A  supplement, at least 3000 IU daily. Amvuttra  decreases serum vitamin A  levels.   Follow up 3 months for repeat Amvuttra  injection.   Tinnie Redman, PharmD, BCPS, BCCP, CPP Heart Failure Clinic Pharmacist [phone]

## 2023-09-06 ENCOUNTER — Ambulatory Visit (HOSPITAL_COMMUNITY)
Admission: RE | Admit: 2023-09-06 | Discharge: 2023-09-06 | Disposition: A | Discharge status: Home / Self Care | Source: Ambulatory Visit | Attending: Family Medicine | Admitting: Family Medicine

## 2023-09-06 DIAGNOSIS — E854 Organ-limited amyloidosis: Secondary | ICD-10-CM | POA: Diagnosis present

## 2023-09-06 DIAGNOSIS — E852 Heredofamilial amyloidosis, unspecified: Secondary | ICD-10-CM | POA: Diagnosis not present

## 2023-09-06 DIAGNOSIS — G4733 Obstructive sleep apnea (adult) (pediatric): Secondary | ICD-10-CM | POA: Diagnosis not present

## 2023-09-06 DIAGNOSIS — Z79899 Other long term (current) drug therapy: Secondary | ICD-10-CM | POA: Insufficient documentation

## 2023-09-06 DIAGNOSIS — Z86711 Personal history of pulmonary embolism: Secondary | ICD-10-CM | POA: Insufficient documentation

## 2023-09-06 DIAGNOSIS — I110 Hypertensive heart disease with heart failure: Secondary | ICD-10-CM | POA: Diagnosis not present

## 2023-09-06 DIAGNOSIS — I5032 Chronic diastolic (congestive) heart failure: Secondary | ICD-10-CM | POA: Diagnosis not present

## 2023-09-06 DIAGNOSIS — I43 Cardiomyopathy in diseases classified elsewhere: Secondary | ICD-10-CM | POA: Diagnosis not present

## 2023-09-06 MED ORDER — VUTRISIRAN SODIUM 25 MG/0.5ML ~~LOC~~ SOSY
25.0000 mg | PREFILLED_SYRINGE | Freq: Once | SUBCUTANEOUS | Status: AC
  Administered 2023-09-06: 25 mg via SUBCUTANEOUS

## 2023-09-06 NOTE — Progress Notes (Signed)
 Advanced Heart Failure Clinic Note  PCP: Rosendo Rush, MD PCP-Cardiologist: Wilbert Bihari, MD  Neurology: Dr Tobie HF Cardiology: Dr. Cherrie  HPI:  Russell Collier is a 63 y.o. 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    Echo 01/2018, EF 55-60%, nonobstructive cardiac cath 01/2018 (20% prox LAD, 60% mid Cx, 50% post atrio lesion) h/o bradycardia w/ ? 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    PYP scan 09/2018 and was an equivocal study. No visual increase in PYP uptake, but ratio was between 1-1.5. Based clinical suspicion, he was ordered to undergo genetic testing and results + for TTR gene.   He was seen by neurology, neuropathy associated with TTR.    Admitted 07/27/19 with atypical chest pain and wheezing. Troponins were low and flat, EKG reassuring. A CTA was performed, which showed a possible segmental PE. Since he does have a history of PE, he was continued on IV heparin  and transitioned to Eliquis  on 07/28/19. Echo 07/27/19 EF 55% RV normal.    At his HF follow up 12/2019 Eliquis  was decreased due to frequent nose bleeds, mobility limited by back and knee pain.   S/p L3-S1 decompression 03/2020, uneventful hospitalization at [facility].   Graduated from paramedicine 09/2020.   Follow up with Neurology 03/2021, recommended starting Amvuttra  and recommended restarting PT.   Echo 04/2021 EF 50-55% RV ok    Admitted 5/18-22/23 for RLE cellulitis. S/p RLE angiography with CO2, no intervention. New wound on LLE, and underwent LLE angiography with balloon angioplasty of left peroneal artery. Had LLE wound which is now healed.    At HF follow up 01/2022 Imdur  discontinued and enalapril  decreased to  10 mg nightly due to orthostasis.   On 06/13/2023 he had a follow up HF clinic visit with APP. He reported feeling overall fine. He reported struggles with gait instability, no recent falls. Walked with a cane and was working with PT. He denied SOB walking on flat ground, denied SOB walking up 21 steps to get to his room. Said his legs were swelling some. Denied palpitations, abnormal bleeding, CP, or PND/Orthopnea. Appetite was ok. No fever or chills. He was not weighing at home. Reported taking all medications. Was not wearing CPAP, and reported smoking 3/4 pack of cigars/day, rare ETOH, no drugs. Was planning to get back to water aerobics after he finished PT.   On 08/06/2023 he presented to the ED with chest pain. He described the chest pain as brief, sharp/stabbing pain to the left chest, with pain lasting 1-2 seconds at a time. CXR was unremarkable. He started to feel better and was eventually discharged without admission.   Today he returns to HF clinic for Amvuttra  injection.  Overall feeling well. No contraindications to injection. Amvuttra  administered in left arm. Patient prefers left arm as he typically has significant injection site pain for 5-10 minutes after injection.   Assessment/Plan: 1. Familial TTR Cardiac Amyloidosis:  - PYP scan 09/2018 was equivocal. + genetic testing. Has TTR gene, heterozygous.  - He is on tafamadis and referred for family genetic testing. No family members with TTR.  - Based on PYP scan not thought to have extensive cardiac involvement at this point.  - He has numerous neuropathic  symptoms. Followed by Dr. Tonita Blanch in Neurology for neuropathy - Continue Tafamidis . 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 Patient noted significant pain at the injection site after injection was administered today. Pain resolved by the end of the visit. Provided patient counseling on Amvuttra . Most common side  effects are injection site reactions, arthralgias, dyspnea and vitamin A  deficiency. Patient is aware to return to clinic every 3 months for repeat injection.   - Continue vitamin A  supplement. Amvuttra  decreases serum vitamin A  levels.   Follow up 3 months for repeat Amvuttra  injection.   Tinnie Redman, PharmD, BCPS, BCCP, CPP Heart Failure Clinic Pharmacist [phone]

## 2023-09-06 NOTE — Patient Instructions (Signed)
 It was a pleasure seeing you today!  MEDICATIONS: -No medication changes today -Call if you have questions about your medications.   NEXT APPOINTMENT: Return to clinic in 3 months for repeat Amvuttra  injection. This is labeled as a nurse visit on your schedule.   In general, to take care of your heart failure: -Limit your fluid intake to 2 Liters (half-gallon) per day.   -Limit your salt intake to ideally 2-3 grams (2000-3000 mg) per day. -Weigh yourself daily and record, and bring that weight diary to your next appointment.  (Weight gain of 2-3 pounds in 1 day typically means fluid weight.) -The medications for your heart are to help your heart and help you live longer.   -Please contact us  before stopping any of your heart medications.  Call the clinic at [phone] with questions or to reschedule future appointments.

## 2023-09-07 ENCOUNTER — Other Ambulatory Visit: Payer: Self-pay

## 2023-09-07 ENCOUNTER — Ambulatory Visit: Attending: Orthopaedic Surgery

## 2023-09-07 DIAGNOSIS — R262 Difficulty in walking, not elsewhere classified: Secondary | ICD-10-CM | POA: Insufficient documentation

## 2023-09-07 DIAGNOSIS — M6281 Muscle weakness (generalized): Secondary | ICD-10-CM | POA: Insufficient documentation

## 2023-09-07 DIAGNOSIS — M5459 Other low back pain: Secondary | ICD-10-CM | POA: Insufficient documentation
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
## 2023-09-08 ENCOUNTER — Other Ambulatory Visit: Payer: Self-pay

## 2023-09-08 ENCOUNTER — Encounter: Payer: Self-pay | Admitting: Podiatry

## 2023-09-08 ENCOUNTER — Ambulatory Visit (INDEPENDENT_AMBULATORY_CARE_PROVIDER_SITE_OTHER): Admitting: Podiatry

## 2023-09-08 DIAGNOSIS — E1151 Type 2 diabetes mellitus with diabetic peripheral angiopathy without gangrene: Secondary | ICD-10-CM | POA: Diagnosis not present

## 2023-09-08 DIAGNOSIS — M79674 Pain in right toe(s): Secondary | ICD-10-CM | POA: Diagnosis not present

## 2023-09-08 DIAGNOSIS — J441 Chronic obstructive pulmonary disease with (acute) exacerbation: Secondary | ICD-10-CM

## 2023-09-08 DIAGNOSIS — B351 Tinea unguium: Secondary | ICD-10-CM | POA: Diagnosis not present

## 2023-09-08 DIAGNOSIS — I739 Peripheral vascular disease, unspecified: Secondary | ICD-10-CM

## 2023-09-08 DIAGNOSIS — M79675 Pain in left toe(s): Secondary | ICD-10-CM

## 2023-09-08 DIAGNOSIS — G4733 Obstructive sleep apnea (adult) (pediatric): Secondary | ICD-10-CM

## 2023-09-08 DIAGNOSIS — M79671 Pain in right foot: Secondary | ICD-10-CM

## 2023-09-08 DIAGNOSIS — Z789 Other specified health status: Secondary | ICD-10-CM

## 2023-09-08 DIAGNOSIS — G8929 Other chronic pain: Secondary | ICD-10-CM

## 2023-09-08 DIAGNOSIS — I5032 Chronic diastolic (congestive) heart failure: Secondary | ICD-10-CM
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
## 2023-09-08 NOTE — Patient Instructions (Signed)
 Visit Information  Thank you for taking time to visit with me today. Please don't hesitate to contact me if I can be of assistance to you before our next scheduled appointment.  Your next care management appointment is Telephone follow up appointment with care management team member scheduled for:  10/11/2023 2:00 PM with Olam Hoots [facility]   Please call the care guide team at [phone] if you need to cancel, schedule, or reschedule an appointment.   Please call the [place]  National Suicide Prevention Lifeline: (208) 499-8728 or TTY: 7266769701 TTY (364) 720-0164) to talk to a trained counselor call 1-800-273-TALK (toll free, 24 hour hotline) call 911 if you are experiencing a Mental Health or Behavioral Health Crisis or need someone to talk to.  Wilbert Diver RN, BSN, [facility] [place]  [facility], [facility]    Care Coordinator Phone: [phone]
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
## 2023-09-09 ENCOUNTER — Telehealth: Payer: Self-pay | Admitting: *Deleted

## 2023-09-09 ENCOUNTER — Other Ambulatory Visit: Payer: Self-pay

## 2023-09-09 ENCOUNTER — Telehealth: Payer: Self-pay

## 2023-09-09 NOTE — Progress Notes (Signed)
 Specialty Pharmacy Refill Coordination Note  Kaian Fahs is a 63 y.o. male contacted today regarding refills of specialty medication(s) Tafamidis  (Vyndamax )   Patient requested Delivery   Delivery date: 09/12/23   Verified address: [address]   [place] [address]   Medication will be filled on 09/09/23.

## 2023-09-09 NOTE — Progress Notes (Signed)
 Complex Care Management Note Care Guide Note  09/09/2023 Name: Russell Collier MRN: [id number] DOB: 1960/02/28  Russell Collier is a 63 y.o. year old male who is a primary care patient of Shitarev, Dimitry, MD . The community resource team was consulted for assistance with Transportation Needs  and Medicaid assistance   SDOH screenings and interventions completed:  Yes     SDOH Interventions Today    Flowsheet Row Most Recent Value  SDOH Interventions   Housing Interventions Community Resources Provided  [Patient has filled out an application for the housing authority and will drop off as he is looking to move inot his own housing]  Transportation Interventions [employer] Provided, Payor Benefit, [employer] (Specialized Community [employer])  [Connected with Joi has , [place] is worker , [phone]  leave message .]  Financial Strain Interventions Intervention Not Indicated     Care guide performed the following interventions: Patient provided with information about care guide support team and interviewed to confirm resource needs.  Follow Up Plan:  No further follow up planned at this time. The patient has been provided with needed resources.  Encounter Outcome:  Patient Visit Completed  Siah Kannan [place]  [facility] Health[facility] Health Care Guide  Direct Dial:[phone] Fax:[phone] Website: [email or web address]
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
## 2023-09-12 ENCOUNTER — Ambulatory Visit: Admitting: Family Medicine

## 2023-09-15 ENCOUNTER — Ambulatory Visit

## 2023-09-15 DIAGNOSIS — M6281 Muscle weakness (generalized): Secondary | ICD-10-CM

## 2023-09-15 DIAGNOSIS — M5459 Other low back pain: Secondary | ICD-10-CM

## 2023-09-15 DIAGNOSIS — R262 Difficulty in walking, not elsewhere classified: Secondary | ICD-10-CM
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
## 2023-09-20 ENCOUNTER — Ambulatory Visit: Admitting: Physical Therapy

## 2023-09-20 ENCOUNTER — Encounter: Payer: Self-pay | Admitting: Physical Therapy

## 2023-09-20 DIAGNOSIS — M5459 Other low back pain: Secondary | ICD-10-CM

## 2023-09-20 DIAGNOSIS — M6281 Muscle weakness (generalized): Secondary | ICD-10-CM

## 2023-09-20 DIAGNOSIS — R262 Difficulty in walking, not elsewhere classified: Secondary | ICD-10-CM

## 2023-09-22 ENCOUNTER — Ambulatory Visit

## 2023-09-22 DIAGNOSIS — M5459 Other low back pain: Secondary | ICD-10-CM

## 2023-09-22 DIAGNOSIS — R262 Difficulty in walking, not elsewhere classified: Secondary | ICD-10-CM

## 2023-09-22 DIAGNOSIS — M6281 Muscle weakness (generalized): Secondary | ICD-10-CM
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
## 2023-09-23 ENCOUNTER — Ambulatory Visit: Admitting: Family Medicine

## 2023-09-23 ENCOUNTER — Encounter: Payer: Self-pay | Admitting: Family Medicine

## 2023-09-23 VITALS — BP 120/63 | HR 78 | Ht 70.0 in | Wt 251.6 lb

## 2023-09-23 DIAGNOSIS — E119 Type 2 diabetes mellitus without complications: Secondary | ICD-10-CM

## 2023-09-23 MED ORDER — BLOOD GLUCOSE MONITORING SUPPL DEVI
1.0000 | Freq: Three times a day (TID) | 0 refills | Status: AC
Start: 2023-09-23 — End: ?

## 2023-09-23 NOTE — Assessment & Plan Note (Signed)
 Discussed A1C. Will recheck at next visit, though managed well on farxiga  and lifestyle changes.

## 2023-09-23 NOTE — Patient Instructions (Signed)
 It was wonderful to see you today.  Please bring ALL of your medications with you to every visit.   Today we talked about:  Your medications are up to date! Please follow up with your PCP in 2-3 months to meet him.   Thank you for choosing [facility].   Please call [phone] with any questions about today's appointment.  Please arrive at least 15 minutes prior to your scheduled appointments.   If you had blood work today, I will send you a MyChart message or a letter if results are normal. Otherwise, I will give you a call.   If you had a referral placed, they will call you to set up an appointment. Please give us  a call if you don't hear back in the next 2 weeks.   If you need additional refills before your next appointment, please call your pharmacy first.   Do you need your medications delivered to your home?   We'll send your prescription to the [facility] for delivery.          Address: [address] [place], [place], [place] [address]          Phone: [phone]  Please call the [facility] to speak with a pharmacist and set up your home medication delivery. If you have any questions, feel free to contact us  -- we're happy to help!  Other [facility] that offer affordable prices on both prescriptions and over-the-counter items, as well as convenient services like vaccinations, are  [facility], at [facility]         Address:  [address], [place], [place] [address]         Phone: [phone]  [facility], located in the [facility]        Address: [address] [place], [place] [address]        Phone: [phone]  [name], at [facility]       Address: [address], [place], [place] [address]       Phone: [phone]  [facility], at [facility]       Address: [address], [place], [place] [address]       Phone: [phone]  You should follow up in our clinic in No follow-ups on file.  Gloriann Ogren, MD Family Medicine

## 2023-09-23 NOTE — Progress Notes (Signed)
    SUBJECTIVE:   CHIEF COMPLAINT / HPI:   Patient is here for medication refill. No other concerns today.   PERTINENT  PMH / PSH:    OBJECTIVE:   BP 120/63   Pulse 78   Ht 5' 10 (1.778 m)   Wt 251 lb 9.6 oz (114.1 kg)   SpO2 99%   BMI 36.10 kg/m   General: A&O, NAD HEENT: No sign of trauma, EOM grossly intact Cardiac: RRR, no m/r/g Respiratory: CTAB  Extremities: NTTP, no peripheral edema. Neuro: ambulates with cane  Psych: Appropriate mood and affect   ASSESSMENT/PLAN:   Assessment & Plan Type 2 diabetes mellitus without complication, without long-term current use of insulin  (HCC) Discussed A1C. Will recheck at next visit, though managed well on farxiga  and lifestyle changes.    Healthcare maintenance: Colonoscopy ordered, patient would like to go to [facility] for this. Sent message to referral coordinator   Patient to schedule follow up with PCP.   Gloriann Ogren, MD [facility]
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
## 2023-09-27 ENCOUNTER — Encounter: Payer: Self-pay | Admitting: Physical Therapy

## 2023-09-27 ENCOUNTER — Ambulatory Visit: Attending: Orthopaedic Surgery | Admitting: Physical Therapy

## 2023-09-27 DIAGNOSIS — M5459 Other low back pain: Secondary | ICD-10-CM | POA: Diagnosis present

## 2023-09-27 DIAGNOSIS — R262 Difficulty in walking, not elsewhere classified: Secondary | ICD-10-CM | POA: Insufficient documentation

## 2023-09-27 DIAGNOSIS — M6281 Muscle weakness (generalized): Secondary | ICD-10-CM | POA: Diagnosis present

## 2023-09-28 ENCOUNTER — Other Ambulatory Visit: Payer: Self-pay

## 2023-09-28 MED ORDER — UMECLIDINIUM-VILANTEROL 62.5-25 MCG/ACT IN AEPB: 1.0000 | INHALATION_SPRAY | Freq: Every day | RESPIRATORY_TRACT | 6 refills | Status: DC

## 2023-09-29 ENCOUNTER — Telehealth: Payer: Self-pay

## 2023-09-29 DIAGNOSIS — J42 Unspecified chronic bronchitis: Secondary | ICD-10-CM

## 2023-09-29 NOTE — Telephone Encounter (Signed)
 Pharmacy Patient Advocate Encounter  Received notification from [employer] that Prior Authorization for [facility] has been DENIED.  Full denial letter will be uploaded to the media tab. See denial reason below.  The drug you asked for is not listed in your preferred drug list (formulary). The preferred drug(s), you may not have tried are: Stiolto Respimat  solution for inhalation. Your provider needs to give us  medical reasons why the preferred drug(s) would not work for you and/or would have bad side effects. Sometimes a preferred drug needs more review for approval. Additionally, some preferred drugs listed may be the same drugs with different strengths or forms. [employer] may only require one strength or form of that drug to be tried.   PA #/Case ID/Reference #: [id number]

## 2023-09-29 NOTE — Telephone Encounter (Signed)
 Pharmacy Patient Advocate Encounter   Received notification from CoverMyMeds that prior authorization for [facility] is required/requested.   Insurance verification completed.   The patient is insured through [employer] .   PA required; PA submitted to above mentioned insurance via CoverMyMeds Key/confirmation #/EOC [id number]. Status is pending

## 2023-09-30 MED ORDER — STIOLTO RESPIMAT 2.5-2.5 MCG/ACT IN AERS: 2.0000 | INHALATION_SPRAY | Freq: Every day | RESPIRATORY_TRACT | 3 refills | Status: DC

## 2023-09-30 NOTE — Addendum Note (Signed)
 Addended byBETHA TOMA MATAS on: 09/30/2023 11:25 AM   Modules accepted: Orders

## 2023-09-30 NOTE — Telephone Encounter (Signed)
 Discontinued Anoro.  Ordered Stiolto Respimat , will trial medication and document results.

## 2023-10-04 ENCOUNTER — Ambulatory Visit: Admitting: Physical Therapy

## 2023-10-04 ENCOUNTER — Other Ambulatory Visit: Payer: Self-pay | Admitting: Pharmacy Technician

## 2023-10-04 ENCOUNTER — Other Ambulatory Visit: Payer: Self-pay

## 2023-10-04 DIAGNOSIS — M6281 Muscle weakness (generalized): Secondary | ICD-10-CM

## 2023-10-04 DIAGNOSIS — M5459 Other low back pain: Secondary | ICD-10-CM

## 2023-10-04 DIAGNOSIS — R262 Difficulty in walking, not elsewhere classified: Secondary | ICD-10-CM

## 2023-10-04 NOTE — Progress Notes (Signed)
 Specialty Pharmacy Refill Coordination Note  Russell Collier is a 63 y.o. male contacted today regarding refills of specialty medication(s) Tafamidis  (Vyndamax )   Patient requested Delivery   Delivery date: 10/11/23   Verified address: [address]   [place] [address]   Medication will be filled on 10/10/23.
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
## 2023-10-05 ENCOUNTER — Other Ambulatory Visit: Payer: Self-pay

## 2023-10-07 ENCOUNTER — Other Ambulatory Visit: Payer: Self-pay

## 2023-10-07 ENCOUNTER — Ambulatory Visit

## 2023-10-07 DIAGNOSIS — R262 Difficulty in walking, not elsewhere classified: Secondary | ICD-10-CM

## 2023-10-07 DIAGNOSIS — M5459 Other low back pain: Secondary | ICD-10-CM | POA: Diagnosis not present

## 2023-10-07 DIAGNOSIS — M6281 Muscle weakness (generalized): Secondary | ICD-10-CM

## 2023-10-07 MED ORDER — PREGABALIN 100 MG PO CAPS: 100.0000 mg | ORAL_CAPSULE | Freq: Three times a day (TID) | ORAL | 3 refills | Status: DC
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
## 2023-10-10 ENCOUNTER — Other Ambulatory Visit: Payer: Self-pay

## 2023-10-11 ENCOUNTER — Other Ambulatory Visit: Payer: Self-pay | Admitting: *Deleted

## 2023-10-11 NOTE — Patient Instructions (Addendum)
 Visit Information  Thank you for taking time to visit with me today. Please don't hesitate to contact me if I can be of assistance to you before our next scheduled appointment.  Your next care management appointment is by telephone on 11/14/2023 at 2:00 PM  Please call the care guide team at [phone] if you need to cancel, schedule, or reschedule an appointment.   Please call the Suicide and Crisis Lifeline: 988 call the [place]  National Suicide Prevention Lifeline: 803 086 2047 or TTY: 215-578-9870 TTY 419-650-9852) to talk to a trained counselor call 1-800-273-TALK (toll free, 24 hour hotline) if you are experiencing a Mental Health or Behavioral Health Crisis or need someone to talk to.   Olam Ku, RN, BSN [place]  [facility], [facility] RN Care Manager Direct Dial: [phone]  Fax: [phone]
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
## 2023-10-12 ENCOUNTER — Ambulatory Visit: Admitting: Physical Therapy

## 2023-10-12 ENCOUNTER — Encounter: Payer: Self-pay | Admitting: Physical Therapy

## 2023-10-12 DIAGNOSIS — M5459 Other low back pain: Secondary | ICD-10-CM

## 2023-10-12 DIAGNOSIS — M6281 Muscle weakness (generalized): Secondary | ICD-10-CM

## 2023-10-12 DIAGNOSIS — R262 Difficulty in walking, not elsewhere classified: Secondary | ICD-10-CM
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
## 2023-10-18 ENCOUNTER — Ambulatory Visit

## 2023-10-18 DIAGNOSIS — M6281 Muscle weakness (generalized): Secondary | ICD-10-CM

## 2023-10-18 DIAGNOSIS — R262 Difficulty in walking, not elsewhere classified: Secondary | ICD-10-CM

## 2023-10-18 DIAGNOSIS — M5459 Other low back pain: Secondary | ICD-10-CM
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
## 2023-10-20 ENCOUNTER — Ambulatory Visit

## 2023-10-20 DIAGNOSIS — M5459 Other low back pain: Secondary | ICD-10-CM | POA: Diagnosis not present
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
## 2023-11-02 ENCOUNTER — Other Ambulatory Visit: Payer: Self-pay

## 2023-11-02 NOTE — Progress Notes (Signed)
 Specialty Pharmacy Refill Coordination Note  Russell Collier is a 63 y.o. male contacted today regarding refills of specialty medication(s) Tafamidis  (Vyndamax )   Patient requested Delivery   Delivery date: 11/07/23   Verified address: [address]   [place] [address]   Medication will be filled on 11/04/23.

## 2023-11-03 ENCOUNTER — Other Ambulatory Visit: Payer: Self-pay

## 2023-11-10 ENCOUNTER — Telehealth (HOSPITAL_COMMUNITY): Payer: Self-pay

## 2023-11-10 ENCOUNTER — Other Ambulatory Visit (HOSPITAL_COMMUNITY): Payer: Self-pay

## 2023-11-11 ENCOUNTER — Other Ambulatory Visit (HOSPITAL_COMMUNITY): Payer: Self-pay

## 2023-11-11 NOTE — Telephone Encounter (Signed)
 Advanced Heart Failure Patient Advocate Encounter  Prior authorization for Vyndamax  has been submitted and approved. Test billing returns refill too soon rejection. Unable to confirm copay at this time.  KeyBETHA REDO Effective: 11/10/2023 to 02/22/2024  Rachel DEL, CPhT Rx Patient Advocate Phone: [phone]

## 2023-11-14 ENCOUNTER — Other Ambulatory Visit: Payer: Self-pay | Admitting: *Deleted

## 2023-11-14 NOTE — Patient Outreach (Signed)
 Complex Care Management   Visit Note  11/14/2023  Name:  Russell Collier MRN: [id number] DOB: 1961/02/12  Situation: RNCM followed up with pt today for the scheduled telephone call. Pt verified name and DOB excepting today's scheduled appointment however patient requested to put RNCM on hold and call was disconnect.  RNCM attempted a call back that was unsuccessful and left a message requesting a call back. If no return call will rescheduled today's appointment for another days.   Olam Ku, RN, BSN [place]  [facility], [facility] RN Care Manager Direct Dial: [phone]  Fax: [phone]

## 2023-11-16 ENCOUNTER — Other Ambulatory Visit: Payer: Self-pay

## 2023-11-16 MED ORDER — VENTOLIN HFA 108 (90 BASE) MCG/ACT IN AERS: 2.0000 | INHALATION_SPRAY | Freq: Four times a day (QID) | RESPIRATORY_TRACT | 3 refills | Status: DC | PRN

## 2023-11-17 ENCOUNTER — Other Ambulatory Visit: Payer: Self-pay

## 2023-11-18 ENCOUNTER — Other Ambulatory Visit: Payer: Self-pay | Admitting: *Deleted

## 2023-11-18 NOTE — Patient Instructions (Signed)
 Visit Information  Thank you for taking time to visit with me today. Please don't hesitate to contact me if I can be of assistance to you before our next scheduled appointment.  Your next care management appointment is by telephone on 12/16/2023 at 1100  Please call the care guide team at [phone] if you need to cancel, schedule, or reschedule an appointment.   Please call the Suicide and Crisis Lifeline: 988 call the [place]  National Suicide Prevention Lifeline: (305) 665-7188 or TTY: (774)738-3420 TTY 682-092-5394) to talk to a trained counselor call 1-800-273-TALK (toll free, 24 hour hotline) if you are experiencing a Mental Health or Behavioral Health Crisis or need someone to talk to.   Olam Ku, RN, BSN [place]  [facility], [facility] RN Care Manager Direct Dial: [phone]  Fax: [phone]
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
## 2023-11-21 ENCOUNTER — Other Ambulatory Visit (HOSPITAL_COMMUNITY): Payer: Self-pay

## 2023-11-24 ENCOUNTER — Other Ambulatory Visit: Payer: Self-pay

## 2023-11-24 NOTE — Progress Notes (Signed)
 Specialty Pharmacy Refill Coordination Note  Russell Collier is a 63 y.o. male assessed today regarding refills of clinic administered specialty medication(s) Vutrisiran  Sodium (Amvuttra )   Clinic requested Courier to Provider Office   Delivery date: 11/29/23   Injection date: 12/05/23  Verified address: [facility] [address] [place], [place] [address]   Medication will be filled on 11/28/23.

## 2023-11-25 ENCOUNTER — Other Ambulatory Visit: Payer: Self-pay

## 2023-11-25 ENCOUNTER — Other Ambulatory Visit (HOSPITAL_COMMUNITY): Payer: Self-pay | Admitting: Internal Medicine

## 2023-11-25 MED ORDER — VYNDAMAX 61 MG PO CAPS
1.0000 | ORAL_CAPSULE | Freq: Every day | ORAL | 6 refills | Status: DC
  Filled 2023-11-25: qty 30, 30d supply, fill #0

## 2023-11-25 NOTE — Progress Notes (Signed)
 Specialty Pharmacy Refill Coordination Note  Russell Collier is a 63 y.o. male contacted today regarding refills of specialty medication(s) Tafamidis  (Vyndamax )   Patient requested Delivery   Delivery date: 11/30/23   Verified address: [address]  [place] [address]   Medication will be filled on 11/29/23.   This fill date is pending response to refill request from provider. Patient is aware and if they have not received fill by intended date they must follow up with pharmacy.

## 2023-11-28 ENCOUNTER — Other Ambulatory Visit (HOSPITAL_COMMUNITY): Payer: Self-pay

## 2023-11-29 ENCOUNTER — Other Ambulatory Visit (HOSPITAL_COMMUNITY): Payer: Self-pay

## 2023-11-29 ENCOUNTER — Other Ambulatory Visit: Payer: Self-pay

## 2023-12-02 ENCOUNTER — Other Ambulatory Visit: Payer: Self-pay

## 2023-12-02 ENCOUNTER — Other Ambulatory Visit (HOSPITAL_COMMUNITY): Payer: Self-pay

## 2023-12-05 ENCOUNTER — Ambulatory Visit (HOSPITAL_COMMUNITY)
Admission: RE | Admit: 2023-12-05 | Discharge: 2023-12-05 | Disposition: A | Discharge status: Home / Self Care | Source: Ambulatory Visit | Attending: Internal Medicine | Admitting: Internal Medicine

## 2023-12-05 DIAGNOSIS — G4733 Obstructive sleep apnea (adult) (pediatric): Secondary | ICD-10-CM | POA: Diagnosis not present

## 2023-12-05 DIAGNOSIS — Z79899 Other long term (current) drug therapy: Secondary | ICD-10-CM | POA: Insufficient documentation

## 2023-12-05 DIAGNOSIS — I110 Hypertensive heart disease with heart failure: Secondary | ICD-10-CM | POA: Diagnosis not present

## 2023-12-05 DIAGNOSIS — E852 Heredofamilial amyloidosis, unspecified: Secondary | ICD-10-CM | POA: Diagnosis not present

## 2023-12-05 DIAGNOSIS — E854 Organ-limited amyloidosis: Secondary | ICD-10-CM | POA: Diagnosis not present

## 2023-12-05 DIAGNOSIS — I5030 Unspecified diastolic (congestive) heart failure: Secondary | ICD-10-CM | POA: Insufficient documentation

## 2023-12-05 DIAGNOSIS — I43 Cardiomyopathy in diseases classified elsewhere: Secondary | ICD-10-CM | POA: Diagnosis not present

## 2023-12-05 MED ORDER — VUTRISIRAN SODIUM 25 MG/0.5ML ~~LOC~~ SOSY
25.0000 mg | PREFILLED_SYRINGE | Freq: Once | SUBCUTANEOUS | Status: AC
  Administered 2023-12-05: 25 mg via SUBCUTANEOUS

## 2023-12-05 NOTE — Progress Notes (Signed)
 Advanced Heart Failure Clinic Note  PCP: Rosendo Rush, MD PCP-Cardiologist: Wilbert Bihari, MD  Neurology: Dr Tobie HF Cardiology: Dr. Cherrie  HPI:  Russell Collier is a 63 y.o. 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    Echo 01/2018, EF 55-60%, nonobstructive cardiac cath 01/2018 (20% prox LAD, 60% mid Cx, 50% post atrio lesion) h/o bradycardia w/ ? 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    PYP scan 09/2018 and was an equivocal study. No visual increase in PYP uptake, but ratio was between 1-1.5. Based clinical suspicion, he was ordered to undergo genetic testing and results + for TTR gene.   He was seen by neurology, neuropathy associated with TTR.    Admitted 07/27/19 with atypical chest pain and wheezing. Troponins were low and flat, EKG reassuring. A CTA was performed, which showed a possible segmental PE. Since he does have a history of PE, he was continued on IV heparin  and transitioned to Eliquis  on 07/28/19. Echo 07/27/19 EF 55% RV normal.    At his HF follow up 12/2019 Eliquis  was decreased due to frequent nose bleeds, mobility limited by back and knee pain.   S/p L3-S1 decompression 03/2020, uneventful hospitalization at [facility].   Graduated from paramedicine 09/2020.   Follow up with Neurology 03/2021, recommended starting Amvuttra  and recommended restarting PT.   Echo 04/2021 EF 50-55% RV ok    Admitted 5/18-22/23 for RLE cellulitis. S/p RLE angiography with CO2, no intervention. New wound on LLE, and underwent LLE angiography with balloon angioplasty of left peroneal artery. Had LLE wound which is now healed.    At HF follow up 01/2022 Imdur  discontinued and enalapril  decreased to  10 mg nightly due to orthostasis.   On 06/13/2023 he had a follow up HF clinic visit with APP. He reported feeling overall fine. He reported struggles with gait instability, no recent falls. Walked with a cane and was working with PT. He denied SOB walking on flat ground, denied SOB walking up 21 steps to get to his room. Said his legs were swelling some. Denied palpitations, abnormal bleeding, CP, or PND/Orthopnea. Appetite was ok. No fever or chills. He was not weighing at home. Reported taking all medications. Was not wearing CPAP, and reported smoking 3/4 pack of cigars/day, rare ETOH, no drugs. Was planning to get back to water aerobics after he finished PT.   On 08/06/2023 he presented to the ED with chest pain. He described the chest pain as brief, sharp/stabbing pain to the left chest, with pain lasting 1-2 seconds at a time. CXR was unremarkable. He started to feel better and was eventually discharged without admission.   Today he returns to HF clinic for Amvuttra  injection.  Overall feeling well. No contraindications to injection. Patient generally prefers left arm as he typically has significant injection site pain for 5-10 minutes after injection, however, today Amvuttra  administered in the right arm.   Assessment/Plan: 1. Familial TTR Cardiac Amyloidosis:  - PYP scan 09/2018 was equivocal. + genetic testing. Has TTR gene mutation, heterozygous.  - He is on tafamadis and referred for family genetic testing. No family members with TTR.  - Based on PYP scan not thought to have extensive cardiac involvement at this point.  -  He has numerous neuropathic symptoms. Followed by Dr. Tonita Blanch in Neurology for neuropathy - Continue Tafamidis . 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 Patient noted no significant pain at the injection site after injection today. Provided patient counseling on Amvuttra . Most common side effects are  injection site reactions, arthralgias, dyspnea and vitamin A  deficiency. Patient is aware to return to clinic every 3 months for repeat injection.   - Continue vitamin A  supplement. Amvuttra  decreases serum vitamin A  levels.   Follow up 3 months for repeat Amvuttra  injection.   Tinnie Redman, PharmD, BCPS, BCCP, CPP Heart Failure Clinic Pharmacist [phone]

## 2023-12-07 ENCOUNTER — Telehealth (HOSPITAL_COMMUNITY): Payer: Self-pay | Admitting: Internal Medicine

## 2023-12-07 NOTE — Telephone Encounter (Signed)
 Called to confirm/remind patient of their appointment at the Advanced [facility] Failure Clinic on 12/07/23.   Appointment:   [x] Confirmed  [] Left mess   [] No answer/No voice mail  [] VM Full/unable to leave message  [] Phone not in service  Patient reminded to bring all medications and/or complete list.  Confirmed patient has transportation. Gave directions, instructed to utilize valet parking.

## 2023-12-08 ENCOUNTER — Other Ambulatory Visit: Payer: Self-pay

## 2023-12-08 ENCOUNTER — Ambulatory Visit (HOSPITAL_COMMUNITY)
Admission: RE | Admit: 2023-12-08 | Discharge: 2023-12-08 | Disposition: A | Discharge status: Home / Self Care | Source: Ambulatory Visit | Attending: Internal Medicine | Admitting: Internal Medicine

## 2023-12-08 ENCOUNTER — Encounter (HOSPITAL_COMMUNITY): Payer: Self-pay | Admitting: Internal Medicine

## 2023-12-08 VITALS — BP 128/68 | HR 76 | Ht 70.0 in | Wt 253.2 lb

## 2023-12-08 DIAGNOSIS — Z7901 Long term (current) use of anticoagulants: Secondary | ICD-10-CM | POA: Insufficient documentation

## 2023-12-08 DIAGNOSIS — Z6836 Body mass index (BMI) 36.0-36.9, adult: Secondary | ICD-10-CM | POA: Diagnosis not present

## 2023-12-08 DIAGNOSIS — I10 Essential (primary) hypertension: Secondary | ICD-10-CM

## 2023-12-08 DIAGNOSIS — E859 Amyloidosis, unspecified: Secondary | ICD-10-CM | POA: Insufficient documentation

## 2023-12-08 DIAGNOSIS — I5032 Chronic diastolic (congestive) heart failure: Secondary | ICD-10-CM

## 2023-12-08 DIAGNOSIS — Z86711 Personal history of pulmonary embolism: Secondary | ICD-10-CM | POA: Diagnosis not present

## 2023-12-08 DIAGNOSIS — F17290 Nicotine dependence, other tobacco product, uncomplicated: Secondary | ICD-10-CM | POA: Insufficient documentation

## 2023-12-08 DIAGNOSIS — E854 Organ-limited amyloidosis: Secondary | ICD-10-CM | POA: Insufficient documentation

## 2023-12-08 DIAGNOSIS — Z79899 Other long term (current) drug therapy: Secondary | ICD-10-CM | POA: Diagnosis not present

## 2023-12-08 DIAGNOSIS — F17210 Nicotine dependence, cigarettes, uncomplicated: Secondary | ICD-10-CM | POA: Diagnosis not present

## 2023-12-08 DIAGNOSIS — E851 Neuropathic heredofamilial amyloidosis: Secondary | ICD-10-CM

## 2023-12-08 DIAGNOSIS — I110 Hypertensive heart disease with heart failure: Secondary | ICD-10-CM | POA: Insufficient documentation

## 2023-12-08 DIAGNOSIS — G4733 Obstructive sleep apnea (adult) (pediatric): Secondary | ICD-10-CM

## 2023-12-08 DIAGNOSIS — I43 Cardiomyopathy in diseases classified elsewhere: Secondary | ICD-10-CM | POA: Insufficient documentation

## 2023-12-08 DIAGNOSIS — I2510 Atherosclerotic heart disease of native coronary artery without angina pectoris: Secondary | ICD-10-CM | POA: Diagnosis not present

## 2023-12-08 DIAGNOSIS — Z86718 Personal history of other venous thrombosis and embolism: Secondary | ICD-10-CM

## 2023-12-08 DIAGNOSIS — R002 Palpitations: Secondary | ICD-10-CM | POA: Insufficient documentation

## 2023-12-08 DIAGNOSIS — G63 Polyneuropathy in diseases classified elsewhere: Secondary | ICD-10-CM

## 2023-12-08 DIAGNOSIS — Z955 Presence of coronary angioplasty implant and graft: Secondary | ICD-10-CM | POA: Diagnosis not present

## 2023-12-08 DIAGNOSIS — E1151 Type 2 diabetes mellitus with diabetic peripheral angiopathy without gangrene: Secondary | ICD-10-CM | POA: Diagnosis not present

## 2023-12-08 DIAGNOSIS — Z7984 Long term (current) use of oral hypoglycemic drugs: Secondary | ICD-10-CM | POA: Insufficient documentation

## 2023-12-08 DIAGNOSIS — I739 Peripheral vascular disease, unspecified: Secondary | ICD-10-CM

## 2023-12-08 DIAGNOSIS — Z7902 Long term (current) use of antithrombotics/antiplatelets: Secondary | ICD-10-CM | POA: Insufficient documentation

## 2023-12-08 LAB — CBC
HCT: 43.3 % (ref 39.0–52.0)
Hemoglobin: 14.6 g/dL (ref 13.0–17.0)
MCH: 31.1 pg (ref 26.0–34.0)
MCHC: 33.7 g/dL (ref 30.0–36.0)
MCV: 92.3 fL (ref 80.0–100.0)
Platelets: 249 K/uL (ref 150–400)
RBC: 4.69 MIL/uL (ref 4.22–5.81)
RDW: 15.9 % — ABNORMAL HIGH (ref 11.5–15.5)
WBC: 10.4 K/uL (ref 4.0–10.5)
nRBC: 0 % (ref 0.0–0.2)

## 2023-12-08 LAB — COMPREHENSIVE METABOLIC PANEL WITH GFR
ALT: 23 U/L (ref 0–44)
AST: 24 U/L (ref 15–41)
Albumin: 4 g/dL (ref 3.5–5.0)
Alkaline Phosphatase: 70 U/L (ref 38–126)
Anion gap: 9 (ref 5–15)
BUN: 19 mg/dL (ref 8–23)
CO2: 27 mmol/L (ref 22–32)
Calcium: 9.3 mg/dL (ref 8.9–10.3)
Chloride: 102 mmol/L (ref 98–111)
Creatinine, Ser: 1.29 mg/dL — ABNORMAL HIGH (ref 0.61–1.24)
GFR, Estimated: >60 mL/min (ref >60)
Glucose, Bld: 73 mg/dL (ref 70–99)
Potassium: 3.9 mmol/L (ref 3.5–5.1)
Sodium: 138 mmol/L (ref 135–145)
Total Bilirubin: 0.7 mg/dL (ref 0.0–1.2)
Total Protein: 7.3 g/dL (ref 6.5–8.1)

## 2023-12-08 LAB — BRAIN NATRIURETIC PEPTIDE: B Natriuretic Peptide: 24 pg/mL (ref 0.0–100.0)

## 2023-12-08 NOTE — Patient Instructions (Signed)
 Medication Changes:  STOP CLOPIDOGREL    Lab Work:  Labs done today, your results will be available in MyChart, we will contact you for abnormal readings.  Follow-Up in: 6 MONTHS WITH AN ECHO PLEASE CALL OUR OFFICE AROUND FEBRUARY TO GET SCHEDULED FOR YOUR APPOINTMENT. PHONE NUMBER IS [phone] OPTION 2   At the Advanced [facility] Failure Clinic, you and your health needs are our priority. We have a designated team specialized in the treatment of Heart Failure. This Care Team includes your primary Heart Failure Specialized Cardiologist (physician), Advanced Practice Providers (APPs- Physician Assistants and Nurse Practitioners), and Pharmacist who all work together to provide you with the care you need, when you need it.   You may see any of the following providers on your designated Care Team at your next follow up:  Dr. Toribio Fuel Dr. Ezra Shuck Dr. Ria Commander Dr. Odis Brownie Greig Mosses, NP Caffie Shed, [place] [facility] [place], [place] Beckey Coe, NP [place] Lee, NP Tinnie Redman, PharmD   Please be sure to bring in all your medications bottles to every appointment.   Need to Contact Us :  If you have any questions or concerns before your next appointment please send us  a message through [place] or call our office at [phone].    TO LEAVE A MESSAGE FOR THE NURSE SELECT OPTION 2, PLEASE LEAVE A MESSAGE INCLUDING: YOUR NAME DATE OF BIRTH CALL BACK NUMBER REASON FOR CALL**this is important as we prioritize the call backs  YOU WILL RECEIVE A CALL BACK THE SAME DAY AS LONG AS YOU CALL BEFORE 4:00 PM
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
## 2023-12-09 ENCOUNTER — Encounter: Payer: Self-pay | Admitting: Student

## 2023-12-09 ENCOUNTER — Other Ambulatory Visit: Payer: Self-pay

## 2023-12-09 ENCOUNTER — Telehealth (HOSPITAL_COMMUNITY): Payer: Self-pay | Admitting: Pharmacist

## 2023-12-09 ENCOUNTER — Ambulatory Visit (INDEPENDENT_AMBULATORY_CARE_PROVIDER_SITE_OTHER): Admitting: Student

## 2023-12-09 VITALS — BP 114/60 | HR 79 | Wt 254.6 lb

## 2023-12-09 DIAGNOSIS — I5032 Chronic diastolic (congestive) heart failure: Secondary | ICD-10-CM | POA: Diagnosis not present

## 2023-12-09 DIAGNOSIS — Z23 Encounter for immunization: Secondary | ICD-10-CM | POA: Diagnosis not present

## 2023-12-09 DIAGNOSIS — J42 Unspecified chronic bronchitis: Secondary | ICD-10-CM

## 2023-12-09 DIAGNOSIS — E119 Type 2 diabetes mellitus without complications: Secondary | ICD-10-CM

## 2023-12-09 DIAGNOSIS — I10 Essential (primary) hypertension: Secondary | ICD-10-CM | POA: Diagnosis not present

## 2023-12-09 DIAGNOSIS — M109 Gout, unspecified: Secondary | ICD-10-CM | POA: Diagnosis not present

## 2023-12-09 DIAGNOSIS — L309 Dermatitis, unspecified: Secondary | ICD-10-CM

## 2023-12-09 DIAGNOSIS — I5030 Unspecified diastolic (congestive) heart failure: Secondary | ICD-10-CM

## 2023-12-09 DIAGNOSIS — G4733 Obstructive sleep apnea (adult) (pediatric): Secondary | ICD-10-CM

## 2023-12-09 DIAGNOSIS — Z122 Encounter for screening for malignant neoplasm of respiratory organs: Secondary | ICD-10-CM

## 2023-12-09 MED ORDER — APIXABAN 2.5 MG PO TABS: 2.5000 mg | ORAL_TABLET | Freq: Two times a day (BID) | ORAL | 3 refills | Status: DC

## 2023-12-09 MED ORDER — BETAMETHASONE VALERATE 0.1 % EX OINT: 1.0000 | TOPICAL_OINTMENT | Freq: Two times a day (BID) | CUTANEOUS | 0 refills | Status: AC

## 2023-12-09 MED ORDER — FLUTICASONE PROPIONATE 50 MCG/ACT NA SUSP: 1.0000 | Freq: Every day | NASAL | 11 refills | Status: AC

## 2023-12-09 MED ORDER — AMVUTTRA 25 MG/0.5ML ~~LOC~~ SOSY: 25.0000 mg | PREFILLED_SYRINGE | SUBCUTANEOUS | 3 refills | Status: DC

## 2023-12-09 MED ORDER — FUROSEMIDE 40 MG PO TABS: ORAL_TABLET | ORAL | 3 refills | Status: DC

## 2023-12-09 MED ORDER — VALACYCLOVIR HCL 500 MG PO TABS: 500.0000 mg | ORAL_TABLET | Freq: Every day | ORAL | 9 refills | Status: DC

## 2023-12-09 MED ORDER — ENALAPRIL MALEATE 5 MG PO TABS: 5.0000 mg | ORAL_TABLET | Freq: Every day | ORAL | 5 refills | Status: AC

## 2023-12-09 MED ORDER — ALLOPURINOL 100 MG PO TABS: 100.0000 mg | ORAL_TABLET | Freq: Every day | ORAL | 3 refills | Status: AC

## 2023-12-09 MED ORDER — COLCHICINE 0.6 MG PO TABS: 0.6000 mg | ORAL_TABLET | Freq: Every day | ORAL | 0 refills | Status: DC

## 2023-12-09 MED ORDER — AMVUTTRA 25 MG/0.5ML ~~LOC~~ SOSY
25.0000 mg | PREFILLED_SYRINGE | SUBCUTANEOUS | 3 refills | Status: AC
  Filled 2023-12-09 – 2024-02-28 (×4): qty 0.5, 90d supply, fill #0

## 2023-12-09 MED ORDER — VYNDAMAX 61 MG PO CAPS
1.0000 | ORAL_CAPSULE | Freq: Every day | ORAL | 6 refills | Status: AC
  Filled 2023-12-09 – 2023-12-30 (×2): qty 30, 30d supply, fill #0
  Filled 2024-01-27 – 2024-01-30 (×2): qty 30, 30d supply, fill #1
  Filled 2024-03-01: qty 30, 30d supply, fill #2
  Filled 2024-03-30: qty 30, 30d supply, fill #3

## 2023-12-09 MED ORDER — VYNDAMAX 61 MG PO CAPS: 1.0000 | ORAL_CAPSULE | Freq: Every day | ORAL | 6 refills | Status: DC

## 2023-12-09 MED ORDER — ROSUVASTATIN CALCIUM 10 MG PO TABS: 10.0000 mg | ORAL_TABLET | Freq: Every day | ORAL | 3 refills | Status: AC

## 2023-12-09 MED ORDER — DAPAGLIFLOZIN PROPANEDIOL 10 MG PO TABS: 10.0000 mg | ORAL_TABLET | Freq: Every day | ORAL | 3 refills | Status: DC

## 2023-12-09 MED ORDER — PANTOPRAZOLE SODIUM 40 MG PO TBEC: 40.0000 mg | DELAYED_RELEASE_TABLET | Freq: Every day | ORAL | 3 refills | Status: AC

## 2023-12-09 MED ORDER — VENTOLIN HFA 108 (90 BASE) MCG/ACT IN AERS: 2.0000 | INHALATION_SPRAY | Freq: Four times a day (QID) | RESPIRATORY_TRACT | 3 refills | Status: DC | PRN

## 2023-12-09 MED ORDER — STIOLTO RESPIMAT 2.5-2.5 MCG/ACT IN AERS: 2.0000 | INHALATION_SPRAY | Freq: Every day | RESPIRATORY_TRACT | 3 refills | Status: AC

## 2023-12-09 MED ORDER — POTASSIUM CHLORIDE CRYS ER 20 MEQ PO TBCR: 40.0000 meq | EXTENDED_RELEASE_TABLET | Freq: Every day | ORAL | 1 refills | Status: DC

## 2023-12-09 NOTE — Telephone Encounter (Signed)
 Refill.

## 2023-12-09 NOTE — Progress Notes (Cosign Needed Addendum)
    SUBJECTIVE:   CHIEF COMPLAINT / HPI:   63 year old male history of HTN, CAD, CHF presented today for annual physical.  Has no medical concerns today but will like to get his medications refilled.  Endorses good appetite and working on improving his diet eating more chicken, fish, vegetables.   Endorses good sleep for mostly 5-6 hours.  No issues reading sleep.  No exercise but generally active.  Still smokes tobacco and occasional marijuana use but denies use of other illicit drugs. Occasionally consumes alcohol but mostly in social settings.  Occasionally sexually active with women and uses condoms consistently.   He is Full code .    PERTINENT  PMH / PSH: Reviewed   OBJECTIVE:   BP 114/60   Pulse 79   Wt 254 lb 9.6 oz (115.5 kg)   SpO2 100%   BMI 36.53 kg/m    Physical Exam General: Alert, well appearing, NAD Cardiovascular: RRR, No Murmurs, Normal S2/S2 Respiratory: CTAB, No wheezing or Rales Abdomen: No distension or tenderness Extremities: No edema on extremities   Skin: Warm and dry  ASSESSMENT/PLAN:   Annual Physical Reviewed patient's Family Medical History Reviewed and updated list of patient's medical providers Counseled patient on tobacco, alcohol use Recommend moderate exercise at least 150 hours a week Emphasized need for healthy dieting Assessment of cognitive impairment was done Assessed patient's functional ability Health Risk Assessent Completed and Reviewed.  Considered the following screening exams based upon USPSTF recommendations: Diabetes screening: will obtain at next visit  Screening for elevated cholesterol: Recently completed Reviewed risk factors for latent tuberculosis and not indicated Colorectal cancer screening:  Provided patient contact for [facility] for colonoscopy  Vaccination: Received both COVID and Flu vaccines today Lung cancer screening: Low-dose chest CT ordered   Norleen April, MD [facility]  [facility]

## 2023-12-09 NOTE — Patient Instructions (Addendum)
 Pleasure to see you today.  Glad to hear that you are doing well overall.  Please continue to work on your diet and continue to stay active.  I have refilled your medications today.  Today we gave you your flu and COVID vaccines today.  You are currently due for your colonoscopy and a referral has already been sent to the address/[facility]  [facility] [address]  [place], [place] [address] Phone: [phone]

## 2023-12-12 ENCOUNTER — Other Ambulatory Visit (INDEPENDENT_AMBULATORY_CARE_PROVIDER_SITE_OTHER): Payer: Self-pay | Admitting: Student

## 2023-12-12 ENCOUNTER — Ambulatory Visit

## 2023-12-12 ENCOUNTER — Other Ambulatory Visit

## 2023-12-12 DIAGNOSIS — E119 Type 2 diabetes mellitus without complications: Secondary | ICD-10-CM | POA: Diagnosis not present

## 2023-12-12 NOTE — Progress Notes (Signed)
 Oprdered A1c

## 2023-12-15 ENCOUNTER — Encounter: Payer: Self-pay | Admitting: Podiatry

## 2023-12-15 ENCOUNTER — Ambulatory Visit: Admitting: Podiatry

## 2023-12-15 DIAGNOSIS — M79675 Pain in left toe(s): Secondary | ICD-10-CM | POA: Diagnosis not present

## 2023-12-15 DIAGNOSIS — B351 Tinea unguium: Secondary | ICD-10-CM | POA: Diagnosis not present

## 2023-12-15 DIAGNOSIS — E1151 Type 2 diabetes mellitus with diabetic peripheral angiopathy without gangrene: Secondary | ICD-10-CM

## 2023-12-15 DIAGNOSIS — M79674 Pain in right toe(s): Secondary | ICD-10-CM | POA: Diagnosis not present

## 2023-12-15 DIAGNOSIS — I739 Peripheral vascular disease, unspecified: Secondary | ICD-10-CM
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
## 2023-12-16 ENCOUNTER — Other Ambulatory Visit: Payer: Self-pay | Admitting: *Deleted

## 2023-12-16 NOTE — Patient Instructions (Signed)
 Visit Information  Thank you for taking time to visit with me today. Please don't hesitate to contact me if I can be of assistance to you before our next scheduled appointment.  Your next care management appointment is by telephone on 01/16/2024 at 11:00 AM  Please call the care guide team at [phone] if you need to cancel, schedule, or reschedule an appointment.   Please call the Suicide and Crisis Lifeline: 988 call the [place]  National Suicide Prevention Lifeline: 832-315-5860 or TTY: 7038077172 TTY (641)733-9635) to talk to a trained counselor call 1-800-273-TALK (toll free, 24 hour hotline) if you are experiencing a Mental Health or Behavioral Health Crisis or need someone to talk to. Olam Ku, RN, BSN [name]  [facility], [facility] RN Care Manager Direct Dial: [phone]  Fax: [phone]
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
## 2023-12-22 ENCOUNTER — Ambulatory Visit
Admission: RE | Admit: 2023-12-22 | Discharge: 2023-12-22 | Disposition: A | Discharge status: Home / Self Care | Source: Ambulatory Visit | Attending: Family Medicine | Admitting: Family Medicine

## 2023-12-22 ENCOUNTER — Other Ambulatory Visit: Payer: Self-pay

## 2023-12-22 DIAGNOSIS — Z122 Encounter for screening for malignant neoplasm of respiratory organs: Secondary | ICD-10-CM

## 2023-12-26 ENCOUNTER — Other Ambulatory Visit

## 2023-12-26 LAB — POCT GLYCOSYLATED HEMOGLOBIN (HGB A1C): HbA1c, POC (controlled diabetic range): 5.8 % (ref 0.0–7.0)

## 2023-12-27 ENCOUNTER — Encounter (HOSPITAL_COMMUNITY): Payer: Self-pay | Admitting: Emergency Medicine

## 2023-12-27 ENCOUNTER — Other Ambulatory Visit: Payer: Self-pay

## 2023-12-27 ENCOUNTER — Emergency Department (HOSPITAL_COMMUNITY)
Admission: EM | Admit: 2023-12-27 | Discharge: 2023-12-28 | Disposition: A | Discharge status: Home / Self Care | Attending: Emergency Medicine | Admitting: Emergency Medicine

## 2023-12-27 DIAGNOSIS — M109 Gout, unspecified: Secondary | ICD-10-CM | POA: Diagnosis not present

## 2023-12-27 DIAGNOSIS — E119 Type 2 diabetes mellitus without complications: Secondary | ICD-10-CM | POA: Diagnosis not present

## 2023-12-27 DIAGNOSIS — Z7901 Long term (current) use of anticoagulants: Secondary | ICD-10-CM | POA: Diagnosis not present

## 2023-12-27 DIAGNOSIS — Z7984 Long term (current) use of oral hypoglycemic drugs: Secondary | ICD-10-CM | POA: Insufficient documentation

## 2023-12-27 DIAGNOSIS — M79672 Pain in left foot: Secondary | ICD-10-CM | POA: Diagnosis present

## 2023-12-27 NOTE — ED Triage Notes (Signed)
 Pt BIB [facility] coming from home with c/o pain on top of L foot. Patient reports taking tylenol  yesterday with no improvement.   EMS VS: 70HR 100%RA 130/80

## 2023-12-27 NOTE — ED Provider Triage Note (Signed)
 Emergency Medicine Provider Triage Evaluation Note  Russell Collier , a 63 y.o. male  was evaluated in triage.  Pt complains of gout in left foot and right knee.  Has been taking colchicine  and allopurinol  given to him by PCP but is not helping.  Also on lasix  and having a hard time getting around now.  Has been taking motrin  intermittently but can't do that because it affects his kidneys.  Review of Systems  Positive: gout Negative: fever  Physical Exam  BP 104/67 (BP Location: Right Arm)   Pulse 72   Temp (!) 97.3 F (36.3 C) (Oral)   Resp 16   Ht 5' 10 (1.778 m)   Wt 109.3 kg   SpO2 99%   BMI 34.58 kg/m  Gen:   Awake, no distress   Resp:  Normal effort  MSK:   Moves extremities without difficulty  Other:    Medical Decision Making  Medically screening exam initiated at 11:48 PM.  Appropriate orders placed.  Russell Collier was informed that the remainder of the evaluation will be completed by another provider, this initial triage assessment does not replace that evaluation, and the importance of remaining in the ED until their evaluation is complete.  Gout flare.  States he is curious about his kidney function and sugar, sometimes it affects his gout.  Will check some labs.   Russell Olam HERO, PA-C 12/27/23 2349

## 2023-12-28 DIAGNOSIS — M109 Gout, unspecified: Secondary | ICD-10-CM | POA: Diagnosis not present

## 2023-12-28 LAB — URIC ACID: Uric Acid, Serum: 5.5 mg/dL (ref 3.7–8.6)

## 2023-12-28 LAB — CBC WITH DIFFERENTIAL/PLATELET
Abs Immature Granulocytes: 0.03 K/uL (ref 0.00–0.07)
Basophils Absolute: 0 K/uL (ref 0.0–0.1)
Basophils Relative: 0 %
Eosinophils Absolute: 0.1 K/uL (ref 0.0–0.5)
Eosinophils Relative: 1 %
HCT: 43.3 % (ref 39.0–52.0)
Hemoglobin: 14.4 g/dL (ref 13.0–17.0)
Immature Granulocytes: 0 %
Lymphocytes Relative: 27 %
Lymphs Abs: 2.6 K/uL (ref 0.7–4.0)
MCH: 31 pg (ref 26.0–34.0)
MCHC: 33.3 g/dL (ref 30.0–36.0)
MCV: 93.3 fL (ref 80.0–100.0)
Monocytes Absolute: 1 K/uL (ref 0.1–1.0)
Monocytes Relative: 11 %
Neutro Abs: 5.8 K/uL (ref 1.7–7.7)
Neutrophils Relative %: 61 %
Platelets: 222 K/uL (ref 150–400)
RBC: 4.64 MIL/uL (ref 4.22–5.81)
RDW: 15.5 % (ref 11.5–15.5)
WBC: 9.6 K/uL (ref 4.0–10.5)
nRBC: 0 % (ref 0.0–0.2)

## 2023-12-28 LAB — BASIC METABOLIC PANEL WITH GFR
Anion gap: 10 (ref 5–15)
BUN: 22 mg/dL (ref 8–23)
CO2: 23 mmol/L (ref 22–32)
Calcium: 9.3 mg/dL (ref 8.9–10.3)
Chloride: 101 mmol/L (ref 98–111)
Creatinine, Ser: 1.2 mg/dL (ref 0.61–1.24)
GFR, Estimated: >60 mL/min (ref >60)
Glucose, Bld: 99 mg/dL (ref 70–99)
Potassium: 3.8 mmol/L (ref 3.5–5.1)
Sodium: 134 mmol/L — ABNORMAL LOW (ref 135–145)

## 2023-12-28 MED ORDER — COLCHICINE 0.6 MG PO TABS: 0.6000 mg | ORAL_TABLET | Freq: Every day | ORAL | 0 refills | Status: DC

## 2023-12-28 MED ORDER — DEXAMETHASONE SOD PHOSPHATE PF 10 MG/ML IJ SOLN
10.0000 mg | Freq: Once | INTRAMUSCULAR | Status: AC
Start: 2023-12-28 — End: 2023-12-28
  Administered 2023-12-28: 10 mg via INTRAVENOUS

## 2023-12-28 MED ORDER — HYDROMORPHONE HCL 1 MG/ML IJ SOLN
1.0000 mg | Freq: Once | INTRAMUSCULAR | Status: AC
  Administered 2023-12-28: 1 mg via INTRAVENOUS
  Filled 2023-12-28: qty 1

## 2023-12-28 MED ORDER — ONDANSETRON HCL 4 MG/2ML IJ SOLN
4.0000 mg | Freq: Once | INTRAMUSCULAR | Status: AC
Start: 2023-12-28 — End: 2023-12-28
  Administered 2023-12-28: 4 mg via INTRAVENOUS
  Filled 2023-12-28: qty 2

## 2023-12-28 MED ORDER — OXYCODONE-ACETAMINOPHEN 5-325 MG PO TABS
1.0000 | ORAL_TABLET | ORAL | 0 refills | Status: AC | PRN
Start: 2023-12-28 — End: ?

## 2023-12-28 NOTE — ED Notes (Signed)
 Pt given food and coffee prior to d/c per request

## 2023-12-28 NOTE — ED Provider Notes (Signed)
 [place] EMERGENCY DEPARTMENT AT [facility] Provider Note   CSN: [id number] Arrival date & time: 12/27/23  2331     Patient presents with: Foot Pain   Russell Collier is a 63 y.o. male.   Patient presents to the emergency department for evaluation of pain in his left foot and right knee.  Patient reports that this is typical of gout for him.  Denies any injury.  He has used colchicine  in the past, unclear if he is using it currently.  It seems currently he is only on allopurinol .       Prior to Admission medications   Medication Sig Start Date End Date Taking? Authorizing Provider  oxyCODONE -acetaminophen  (PERCOCET) 5-325 MG tablet Take 1-2 tablets by mouth every 4 (four) hours as needed. 12/28/23  Yes Lilianne Delair, Lonni PARAS, MD  acetaminophen  (TYLENOL ) 325 MG tablet Take 2 tablets (650 mg total) by mouth every 6 (six) hours as needed for mild pain or moderate pain. 07/13/21   Dameron, Marisa, DO  albuterol  (PROVENTIL ) (2.5 MG/3ML) 0.083% nebulizer solution Take 3 mLs (2.5 mg total) by nebulization every 6 (six) hours as needed for wheezing or shortness of breath. 11/20/20   Cresenzo, John V, MD  allopurinol  (ZYLOPRIM ) 100 MG tablet Take 1 tablet (100 mg total) by mouth daily. 12/09/23   Rosendo Rush, MD  apixaban  (ELIQUIS ) 2.5 MG TABS tablet Take 1 tablet (2.5 mg total) by mouth 2 (two) times daily. 12/09/23   Rosendo Rush, MD  betamethasone  valerate ointment (VALISONE ) 0.1 % Apply 1 Application topically 2 (two) times daily. 12/09/23   Rosendo Rush, MD  Blood Glucose Monitoring Suppl DEVI 1 each by Does not apply route in the morning, at noon, and at bedtime. May substitute to any manufacturer covered by patient's insurance. 09/23/23   Baloch, Mahnoor, MD  colchicine  0.6 MG tablet Take 1 tablet (0.6 mg total) by mouth daily. 12/28/23   Haze Lonni PARAS, MD  dapagliflozin  propanediol (FARXIGA ) 10 MG TABS tablet Take 1 tablet (10 mg total) by mouth daily before breakfast.  12/09/23   Rosendo Rush, MD  enalapril  (VASOTEC ) 5 MG tablet Take 1 tablet (5 mg total) by mouth at bedtime. 12/09/23   Rosendo Rush, MD  fluticasone  (FLONASE ) 50 MCG/ACT nasal spray Place 1 spray into both nostrils daily. 12/09/23   Rosendo Rush, MD  furosemide  (LASIX ) 40 MG tablet Take 40 mg alternating with 20 mg daily 12/09/23   Rosendo Rush, MD  lidocaine  (LIDODERM ) 5 % Place 1 patch onto the skin daily. Remove & Discard patch within 12 hours or as directed by MD 12/01/21   Palumbo, April, MD  multivitamin (ONE-A-DAY MEN'S) TABS tablet Take 1 tablet by mouth daily with breakfast.    [provider]  nicotine  (NICODERM CQ ) 7 mg/24hr patch Place 1 patch (7 mg total) onto the skin daily. 08/06/23   Milford, Harlene HERO, FNP  pantoprazole  (PROTONIX ) 40 MG tablet Take 1 tablet (40 mg total) by mouth daily. 12/09/23   Rosendo Rush, MD  potassium chloride  SA (KLOR-CON  M) 20 MEQ tablet Take 2 tablets (40 mEq total) by mouth daily. 12/09/23   Rosendo Rush, MD  pregabalin  (LYRICA ) 100 MG capsule Take 1 capsule (100 mg total) by mouth 3 (three) times daily. 12/09/23   Rosendo Rush, MD  rosuvastatin  (CRESTOR ) 10 MG tablet Take 1 tablet (10 mg total) by mouth daily. 12/09/23   Rosendo Rush, MD  Tafamidis  (VYNDAMAX ) 61 MG CAPS TAKE 1 CAPSULE BY MOUTH DAILY. 12/09/23 12/08/24  [name], Daniel R, MD  Tiotropium Bromide-Olodaterol (STIOLTO RESPIMAT ) 2.5-2.5 MCG/ACT AERS Inhale 2 puffs into the lungs daily. 12/09/23   Rosendo Rush, MD  triamcinolone  ointment (KENALOG ) 0.5 % Apply 1 Application topically 2 (two) times daily. 02/28/23   Orlando Pond, DO  valACYclovir  (VALTREX ) 500 MG tablet Take 1 tablet (500 mg total) by mouth daily. 12/09/23 10/04/24  Rosendo Rush, MD  VENTOLIN  HFA 108 (90 Base) MCG/ACT inhaler Inhale 2 puffs into the lungs every 6 (six) hours as needed for wheezing or shortness of breath. 12/09/23   Rosendo Rush, MD  Vitamin A  2400 MCG (8000 UT) TABS Take 1 tablet by mouth  daily. 05/21/21   [name], Toribio SAUNDERS, MD  vutrisiran  sodium (AMVUTTRA ) 25 MG/0.5ML syringe Inject 0.5 mLs (25 mg total) into the skin every 3 (three) months. 12/09/23   [name], Toribio SAUNDERS, MD    Allergies: Chantix  [varenicline ], Bupropion , Bactrim  [sulfamethoxazole -trimethoprim ], and Nicoderm [nicotine ]    Review of Systems  Updated Vital Signs BP (!) 115/92 (BP Location: Right Arm)   Pulse 65   Temp (!) 97.3 F (36.3 C) (Oral)   Resp 18   Ht 5' 10 (1.778 m)   Wt 109.3 kg   SpO2 100%   BMI 34.58 kg/m   Physical Exam Vitals and nursing note reviewed.  Constitutional:      General: He is not in acute distress.    Appearance: He is well-developed.  HENT:     Head: Normocephalic and atraumatic.     Mouth/Throat:     Mouth: Mucous membranes are moist.  Eyes:     General: Vision grossly intact. Gaze aligned appropriately.     Extraocular Movements: Extraocular movements intact.     Conjunctiva/sclera: Conjunctivae normal.  Cardiovascular:     Rate and Rhythm: Normal rate and regular rhythm.     Pulses: Normal pulses.     Heart sounds: Normal heart sounds, S1 normal and S2 normal. No murmur heard.    No friction rub. No gallop.  Pulmonary:     Effort: Pulmonary effort is normal. No respiratory distress.     Breath sounds: Normal breath sounds.  Abdominal:     Palpations: Abdomen is soft.     Tenderness: There is no abdominal tenderness. There is no guarding or rebound.     Hernia: No hernia is present.  Musculoskeletal:        General: No swelling.     Cervical back: Full passive range of motion without pain, normal range of motion and neck supple. No pain with movement, spinous process tenderness or muscular tenderness. Normal range of motion.     Right knee: No swelling, deformity, effusion, erythema or ecchymosis. Normal range of motion. Tenderness present.     Right lower leg: No edema.     Left lower leg: No edema.     Left foot: Tenderness ([place]) present. No  swelling or deformity.  Skin:    General: Skin is warm and dry.     Capillary Refill: Capillary refill takes less than 2 seconds.     Findings: No ecchymosis, erythema, lesion or wound.  Neurological:     Mental Status: He is alert and oriented to person, place, and time.     GCS: GCS eye subscore is 4. GCS verbal subscore is 5. GCS motor subscore is 6.     Cranial Nerves: Cranial nerves 2-12 are intact.     Sensory: Sensation is intact.     Motor: Motor function is intact. No  weakness or abnormal muscle tone.     Coordination: Coordination is intact.  Psychiatric:        Mood and Affect: Mood normal.        Speech: Speech normal.        Behavior: Behavior normal.     (all labs ordered are listed, but only abnormal results are displayed) Labs Reviewed  BASIC METABOLIC PANEL WITH GFR - Abnormal; Notable for the following components:      Result Value   Sodium 134 (*)    All other components within normal limits  CBC WITH DIFFERENTIAL/PLATELET  URIC ACID    EKG: None  Radiology: No results found.   Procedures   Medications Ordered in the ED  dexamethasone  (DECADRON ) injection 10 mg (10 mg Intravenous Given 12/28/23 0221)  HYDROmorphone  (DILAUDID ) injection 1 mg (1 mg Intravenous Given 12/28/23 0221)  ondansetron  (ZOFRAN ) injection 4 mg (4 mg Intravenous Given 12/28/23 0219)                                    Medical Decision Making Risk Prescription drug management.   Presents with pain in his left foot which is typical area for gout for him.  He also has some pain in his right knee.  The knee exam is unremarkable, no erythema, warmth, swelling or decreased range of motion.  No concern for septic joint.  Palpable pulses bilaterally in the feet.  Patient with some tenderness of the left foot without any obvious swelling.  Patient listed as diabetic, but he reports that he does not have diabetes and his A1c's have been normal.  Will give a dose of Decadron  here, pain  medication.  Follow-up with PCP.     Final diagnoses:  Acute gout, unspecified cause, unspecified site    ED Discharge Orders          Ordered    colchicine  0.6 MG tablet  Daily        12/28/23 0306    oxyCODONE -acetaminophen  (PERCOCET) 5-325 MG tablet  Every 4 hours PRN        12/28/23 0306               Tamari Redwine J, MD 12/28/23 [phone]

## 2023-12-30 ENCOUNTER — Telehealth (HOSPITAL_BASED_OUTPATIENT_CLINIC_OR_DEPARTMENT_OTHER): Payer: Self-pay

## 2023-12-30 ENCOUNTER — Other Ambulatory Visit: Payer: Self-pay

## 2023-12-30 NOTE — Progress Notes (Signed)
 Specialty Pharmacy Refill Coordination Note  Russell Collier is a 63 y.o. male contacted today regarding refills of specialty medication(s) Tafamidis  (Vyndamax )   Patient requested Delivery   Delivery date: 01/06/24   Verified address: [address]  [place] [address]   Medication will be filled on: 01/05/24

## 2023-12-30 NOTE — Telephone Encounter (Signed)
 Pharmacy please advise on holding Eliquis  prior to colonoscopy scheduled for TBD. Last labs 12/27/2023. Thank you.

## 2023-12-30 NOTE — Telephone Encounter (Signed)
 Dr. [name]  You saw this patient on 12/08/2023. Per protocol we request that you comment on his cardiac risk to proceed with colonoscopy since it has been less than 2 months since evaluated in the office. Please send your comment to P CV Pre-Op Pool. I have contact pharmacy for recommendations on Eliquis .  Thank you, Lamarr Satterfield DNP, ANP, AACC.

## 2023-12-30 NOTE — Telephone Encounter (Signed)
   Pre-operative Risk Assessment    Patient Name: Russell Collier  DOB: 1960/11/25 MRN: [id number]   Date of last office visit: 12/08/23 with [name] Date of next office visit: NA   Request for Surgical Clearance    Procedure:  Colonoscopy  Date of Surgery:  Clearance TBD                                 Surgeon:  Shanna Denmark, MD Surgeon's Group or Practice Name:  [facility] [place] Phone number:  [phone] Fax number:  [phone]   Type of Clearance Requested:   - Medical  - Pharmacy:  Hold Apixaban  (Eliquis ) 2 days prior   Type of Anesthesia:  Not Indicated   Additional requests/questions:    Bonney Augustin JONETTA Delores   12/30/2023, 2:34 PM

## 2024-01-04 NOTE — Telephone Encounter (Signed)
   Patient Name: Russell Collier  DOB: November 18, 1960 MRN: [id number]  Primary Cardiologist: Toribio Fuel, MD  Chart reviewed as part of pre-operative protocol coverage. Pre-op clearance already addressed by colleagues in earlier phone notes. To summarize recommendations:  - Dr. Bettyjane feels patient is OK to proceed from medical standpoint. - Regarding anticoagulation, pharmacist has noted that patient on Eliquis  for hx of PE. Eliquis  prescribed by PCP, Dr. Rosendo. Would recommend clearance be handled by PCP office.   Will route this bundled recommendation to requesting provider via Epic fax function and remove from pre-op pool. Please call with questions.  Yuko Coventry N Chassie Pennix, PA-C 01/04/2024, 4:27 PM

## 2024-01-04 NOTE — Telephone Encounter (Signed)
 Patient on Eliquis  for hx of PE. Eliquis  prescribed by PCP, Dr. Rosendo. Would recommend clearance be handled by PCP office.

## 2024-01-05 ENCOUNTER — Other Ambulatory Visit: Payer: Self-pay

## 2024-01-11 ENCOUNTER — Other Ambulatory Visit: Payer: Self-pay

## 2024-01-11 NOTE — Progress Notes (Signed)
 Specialty Pharmacy Ongoing Clinical Assessment Note  Russell Collier is a 63 y.o. male who is being followed by the specialty pharmacy service for RxSp Cardiology   Patient's specialty medication(s) reviewed today: Tafamidis  (Vyndamax ); Vutrisiran  Sodium (Amvuttra )   Missed doses in the last 4 weeks: 0   Patient/Caregiver did not have any additional questions or concerns.   Therapeutic benefit summary: Patient is achieving benefit   Adverse events/side effects summary: No adverse events/side effects   Patient's therapy is appropriate to: Continue    Goals Addressed             This Visit's Progress    Slow Disease Progression   On track    Patient is on track. Patient will maintain adherence      Stabilization of disease   On track    Patient is on track. Patient will maintain adherence. No recent cardiac related admissions. Per Dr. Nelle note from 12/08/23 patient remains stable with regards to amyloidosis.          Follow up: 12 months  Delon CHRISTELLA Brow Specialty Pharmacist

## 2024-01-16 ENCOUNTER — Other Ambulatory Visit: Payer: Self-pay | Admitting: *Deleted
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
## 2024-01-16 NOTE — Patient Instructions (Signed)
 Visit Information  Thank you for taking time to visit with me today. Please don't hesitate to contact me if I can be of assistance to you before our next scheduled appointment.  Your next care management appointment is by telephone on 02/13/2024 at 11:00 am  Please call the care guide team at [phone] if you need to cancel, schedule, or reschedule an appointment.   Please call the Suicide and Crisis Lifeline: 988 call the [place]  National Suicide Prevention Lifeline: (301) 127-0899 or TTY: 931-829-5894 TTY 306-833-6038) to talk to a trained counselor call 1-800-273-TALK (toll free, 24 hour hotline) if you are experiencing a Mental Health or Behavioral Health Crisis or need someone to talk to.   Olam Ku, RN, BSN [place]  [facility], [facility] Health RN Care Manager Direct Dial: [phone]  Fax: [phone]

## 2024-01-18 ENCOUNTER — Other Ambulatory Visit (HOSPITAL_COMMUNITY): Payer: Self-pay | Admitting: Internal Medicine

## 2024-01-23 ENCOUNTER — Other Ambulatory Visit (HOSPITAL_COMMUNITY): Payer: Self-pay

## 2024-01-23 DIAGNOSIS — I5030 Unspecified diastolic (congestive) heart failure: Secondary | ICD-10-CM

## 2024-01-23 DIAGNOSIS — I5032 Chronic diastolic (congestive) heart failure: Secondary | ICD-10-CM

## 2024-01-23 DIAGNOSIS — I10 Essential (primary) hypertension: Secondary | ICD-10-CM

## 2024-01-23 DIAGNOSIS — G4733 Obstructive sleep apnea (adult) (pediatric): Secondary | ICD-10-CM

## 2024-01-23 MED ORDER — POTASSIUM CHLORIDE CRYS ER 20 MEQ PO TBCR: 40.0000 meq | EXTENDED_RELEASE_TABLET | Freq: Every day | ORAL | 1 refills | Status: AC

## 2024-01-23 MED ORDER — DAPAGLIFLOZIN PROPANEDIOL 10 MG PO TABS: 10.0000 mg | ORAL_TABLET | Freq: Every day | ORAL | 3 refills | Status: AC

## 2024-01-26 ENCOUNTER — Other Ambulatory Visit (HOSPITAL_COMMUNITY): Payer: Self-pay

## 2024-01-26 ENCOUNTER — Telehealth (HOSPITAL_COMMUNITY): Payer: Self-pay

## 2024-01-26 NOTE — Telephone Encounter (Signed)
 Advanced Heart Failure Patient Advocate Encounter  Prior authorization for Vyndamax  has been submitted and approved. Test billing returns refill too soon rejection. Unable to confirm copay at this time.   Key: [id number] Effective: 01/26/2024 to 02/21/2025  Rachel DEL, CPhT Rx Patient Advocate Phone: [phone]

## 2024-01-27 ENCOUNTER — Other Ambulatory Visit: Payer: Self-pay

## 2024-01-30 ENCOUNTER — Other Ambulatory Visit (HOSPITAL_COMMUNITY): Payer: Self-pay

## 2024-01-31 ENCOUNTER — Ambulatory Visit: Admitting: Family Medicine

## 2024-01-31 VITALS — BP 127/72 | HR 64 | Wt 259.6 lb

## 2024-01-31 DIAGNOSIS — H9202 Otalgia, left ear: Secondary | ICD-10-CM

## 2024-01-31 DIAGNOSIS — H9392 Unspecified disorder of left ear: Secondary | ICD-10-CM

## 2024-01-31 DIAGNOSIS — Z86711 Personal history of pulmonary embolism: Secondary | ICD-10-CM

## 2024-01-31 DIAGNOSIS — Z7901 Long term (current) use of anticoagulants: Secondary | ICD-10-CM

## 2024-01-31 NOTE — Progress Notes (Cosign Needed Addendum)
 SUBJECTIVE:   CHIEF COMPLAINT / HPI:  Russell Collier is a 63 y.o. 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  Colonoscopy Patient has been referred to [facility] for colonoscopy. I have received a form from them for clearance as patient is on Eliquis . Request is for holding Eliquis  for 2 days prior to procedure.  Patient is on Eliquis  2.5 mg for history of PEs: - 1 PE prior to 2017 - 1 in 2019 - Possible small PE in 2021  Thrombosis [place] Perioperative Anticoagulant Management Algorithm states: Hold DOAC on the day of procedure and resume the next day Continue DOAC up to and including the day of procedure BUT delay that day's dose for 4-6 hours after the procedure   Otalgia (L>>R) Patient has been having some ear pain. He has been using silicone ear plugs in the shower. Pain is intermittent, happens every few weeks to months. Last pain was about 1 month ago. Patient has not noticed any drainage. Patient does use Q-tips occasionally.   PERTINENT PMH / PSH: - Multiple pulmonary embolisms as above - Diastolic CHF - CAD, unstable angina - Hx laryngeal spasm - Hx OSA and COPD  *Remainder reviewed in problem list.   OBJECTIVE:   BP 127/72   Pulse 64   Wt 259 lb 9.6 oz (117.8 kg)   SpO2 97%   BMI 37.25 kg/m   General: Age-appropriate, resting comfortably in chair, NAD, alert and at baseline. HEENT:  Head: Normocephalic, atraumatic. No tenderness to percussion over sinuses. Eyes: PERRLA. No conjunctival erythema or scleral injections. Ears: Left ear internal ear canal with friable papule with crusted overlying blood, possible traumatic lesion. TMs non-bulging and non-erythematous bilaterally. No erythema of external ear canal. No cerumen impaction bilaterally. Nose: Non-erythematous turbinates. No rhinorrhea. Mouth/Oral: Clear, no tonsillar  exudate. MMM.  Mallampati 2. Neck: Supple. No LAD. Cardiovascular: Regular rate and rhythm. Normal S1/S2. No murmurs, rubs, or gallops appreciated. 2+ radial pulses. Pulmonary: Clear bilaterally to ascultation. No wheezes, crackles, or rhonchi. Normal WOB on room air. No accessory muscle use. Extremities: No peripheral edema bilaterally. Capillary refill <2 seconds.    No results found. However, due to the size of the patient record, not all encounters were searched. Please check Results Review for a complete set of results.     10/11/2023    2:32 PM  Depression screen PHQ 2/9  Decreased Interest 0  Down, Depressed, Hopeless 0  PHQ - 2 Score 0     ASSESSMENT/PLAN:   Assessment & Plan Left ear pain Lesion of left ear Most likely intermittently bleeding abrasion due to use of Q-tips.  However, given patient's extensive smoking history, recurrent pain across several months, and papular appearance of lesion, cannot rule out cancer of head and neck.  However, lower concern for this. - Ambulatory referral to ENT for further evaluation History of pulmonary embolism Anticoagulated Agree with holding anticoagulation for 2 days prior to colonoscopy, also possible hold for 1 day but reasonable to follow GI request.  Low risk for VTE over 2 days, screening colonoscopy warrants this measure.  Discussed risks and benefits of holding anticoagulation for colonoscopy as requested by GI.  Patient understands low possibility of repeat clot with current plan, but after shared decision making patient is in agreement with current plan. - Filled out clearance form and fax back to [facility], who will call him  to schedule - Patient will hold Eliquis  2.5 mg for 2 days prior to colonoscopy  No follow-ups on file.  Undray Allman Toma, MD [facility]

## 2024-01-31 NOTE — Patient Instructions (Addendum)
 It was great to see you today! Thank you for choosing [facility] for your primary care.  Today we addressed: Colonoscopy I have filled out the form to clear you for your procedure.  You will hold your Eliquis  for TWO DAYS prior to your procedure.  They will call you to schedule it.  Ear pain, nose discomfort Please do not put any Q-tips or anything large in your finger in your ear.  I believe you have a small cut that has been bleeding and scabbing over is causing your issues.  However, I would like for you to see the ear nose throat doctor and I placed a referral to get you over there.  I want a make sure it is not anything more serious.  At that point, you can also bring up your nose discomfort.  *If you had a referral placed, they will call you to set up an appointment. Please give us  a call if you don't hear back in the next 2 weeks.  You should return to our clinic in 3 months.  Thank you for coming to see us  at [facility] and for the opportunity to care for you! Maela Takeda, MD 01/31/2024, 11:57 AM

## 2024-02-01 ENCOUNTER — Ambulatory Visit (HOSPITAL_COMMUNITY): Admission: RE | Admit: 2024-02-01 | Discharge: 2024-02-01 | Attending: Vascular Surgery

## 2024-02-01 ENCOUNTER — Other Ambulatory Visit: Payer: Self-pay

## 2024-02-01 ENCOUNTER — Ambulatory Visit

## 2024-02-01 ENCOUNTER — Encounter: Payer: Self-pay | Admitting: Physician Assistant

## 2024-02-01 VITALS — BP 111/71 | HR 66 | Temp 97.7°F | Wt 256.8 lb

## 2024-02-01 DIAGNOSIS — I872 Venous insufficiency (chronic) (peripheral): Secondary | ICD-10-CM | POA: Diagnosis present

## 2024-02-01 DIAGNOSIS — I739 Peripheral vascular disease, unspecified: Secondary | ICD-10-CM

## 2024-02-01 DIAGNOSIS — I70222 Atherosclerosis of native arteries of extremities with rest pain, left leg: Secondary | ICD-10-CM | POA: Diagnosis present

## 2024-02-01 LAB — VAS US ABI WITH/WO TBI
Left ABI: 0.87
Right ABI: 0.91

## 2024-02-01 NOTE — Progress Notes (Signed)
 Specialty Pharmacy Refill Coordination Note  Russell Collier is a 63 y.o. male contacted today regarding refills of specialty medication(s) Tafamidis  (Vyndamax )   Patient requested Delivery   Delivery date: 02/08/24   Verified address: [address]  [place] [address]   Medication will be filled on: 02/07/24
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
## 2024-02-02 ENCOUNTER — Other Ambulatory Visit: Payer: Self-pay | Admitting: *Deleted

## 2024-02-02 DIAGNOSIS — I70222 Atherosclerosis of native arteries of extremities with rest pain, left leg: Secondary | ICD-10-CM

## 2024-02-02 DIAGNOSIS — I739 Peripheral vascular disease, unspecified: Secondary | ICD-10-CM

## 2024-02-07 ENCOUNTER — Other Ambulatory Visit (HOSPITAL_COMMUNITY): Payer: Self-pay | Admitting: Internal Medicine

## 2024-02-07 ENCOUNTER — Other Ambulatory Visit: Payer: Self-pay

## 2024-02-13 ENCOUNTER — Telehealth: Payer: Self-pay

## 2024-02-13 ENCOUNTER — Telehealth: Admitting: *Deleted

## 2024-02-13 NOTE — Telephone Encounter (Signed)
 Called patient. Scheduled for 03/07/24 to further discuss home health referral.   Russell JAYSON English, RN
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
## 2024-02-13 NOTE — Telephone Encounter (Signed)
 Received call from Olam, RN Case Manager at [facility] regarding patient.   Patient is requesting to restart home health PT and OT.   Patient just had face to face visit on 12/9. They were hoping that this could meet the requirement and that patient would not need another visit for home health order.   Will forward request to PCP.   Please advise.   Chiquita JAYSON English, RN

## 2024-02-13 NOTE — Patient Instructions (Signed)
 Visit Information  Thank you for taking time to visit with me today. Please don't hesitate to contact me if I can be of assistance to you before our next scheduled appointment.  Your next care management appointment is by telephone on 03/08/2024 at 2:30 pm  Please call the care guide team at [phone] if you need to cancel, schedule, or reschedule an appointment.   Please call the Suicide and Crisis Lifeline: 988 call the [place]  National Suicide Prevention Lifeline: 206-692-4892 or TTY: 434 240 4656 TTY 915-304-4262) to talk to a trained counselor call 1-800-273-TALK (toll free, 24 hour hotline) if you are experiencing a Mental Health or Behavioral Health Crisis or need someone to talk to.   Olam Ku, RN, BSN [place]  [facility], [facility] RN Care Manager Direct Dial: [phone]  Fax: [phone]

## 2024-02-20 ENCOUNTER — Other Ambulatory Visit: Payer: Self-pay

## 2024-02-20 ENCOUNTER — Other Ambulatory Visit (HOSPITAL_COMMUNITY): Payer: Self-pay

## 2024-02-20 ENCOUNTER — Telehealth (HOSPITAL_COMMUNITY): Payer: Self-pay

## 2024-02-24 ENCOUNTER — Other Ambulatory Visit: Payer: Self-pay

## 2024-02-24 ENCOUNTER — Other Ambulatory Visit (HOSPITAL_COMMUNITY): Payer: Self-pay

## 2024-02-24 NOTE — Telephone Encounter (Signed)
 Advanced Heart Failure Patient Advocate Encounter  Prior authorization for Amvuttra  has been submitted and approved. Test billing returns cost limit exception; unable to confirm copay at this time  Key: [id number] Effective: 02/22/2024 to 02/21/2025  Rachel DEL, CPhT Rx Patient Advocate Phone: [phone]

## 2024-02-27 ENCOUNTER — Other Ambulatory Visit (HOSPITAL_COMMUNITY): Payer: Self-pay

## 2024-02-27 ENCOUNTER — Ambulatory Visit (INDEPENDENT_AMBULATORY_CARE_PROVIDER_SITE_OTHER): Admitting: Physician Assistant

## 2024-02-27 ENCOUNTER — Encounter (INDEPENDENT_AMBULATORY_CARE_PROVIDER_SITE_OTHER): Payer: Self-pay | Admitting: Physician Assistant

## 2024-02-27 VITALS — BP 117/66 | HR 72 | Temp 97.8°F | Ht 70.5 in | Wt 257.0 lb

## 2024-02-27 DIAGNOSIS — H61893 Other specified disorders of external ear, bilateral: Secondary | ICD-10-CM | POA: Diagnosis not present

## 2024-02-27 DIAGNOSIS — J343 Hypertrophy of nasal turbinates: Secondary | ICD-10-CM

## 2024-02-27 DIAGNOSIS — L720 Epidermal cyst: Secondary | ICD-10-CM | POA: Diagnosis not present

## 2024-02-27 DIAGNOSIS — J309 Allergic rhinitis, unspecified: Secondary | ICD-10-CM

## 2024-02-27 MED ORDER — LORATADINE 10 MG PO TABS: 10.0000 mg | ORAL_TABLET | Freq: Every day | ORAL | 11 refills | Status: AC
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
## 2024-02-28 ENCOUNTER — Other Ambulatory Visit: Payer: Self-pay

## 2024-02-28 ENCOUNTER — Other Ambulatory Visit (HOSPITAL_COMMUNITY): Payer: Self-pay

## 2024-02-28 ENCOUNTER — Encounter: Payer: Self-pay | Admitting: *Deleted

## 2024-02-28 NOTE — Progress Notes (Signed)
 Specialty Pharmacy Refill Coordination Note  Russell Collier is a 64 y.o. male contacted today regarding refills of specialty medication(s) Vutrisiran  Sodium (Amvuttra )   Patient requested Courier to Provider Office   Delivery date: 03/01/24   Verified address: [facility] [address]   Medication will be filled on: 02/29/24

## 2024-02-28 NOTE — Patient Instructions (Signed)
 Russell Collier - I am sorry I was unable to reach you today for our scheduled appointment. I work with Toma, Dimitry, MD and am calling to support your healthcare needs. Please contact me at [phone] at your earliest convenience. I look forward to speaking with you soon.   Thank you,   Olam Ku, RN, BSN   [facility], [facility] RN Care Manager Direct Dial: [phone]  Fax: [phone]

## 2024-02-28 NOTE — Patient Outreach (Signed)
 Complex Care Management   Visit Note  02/28/2024  Name:  Russell Collier MRN: [id number] DOB: [birth year]  Situation: RNCM received a call from pt on 02/27/2024 requested a call back. RNCM attempted to return that call today however unsuccessful. HIPAA message left with call back number to the care-guide [phone] to scheduled a call back or speak with another RNCM if needed.   Pt will be reassigned to a new RNCM with a scheduled call 03/08/2024 @ 2:30 pm with [place]  Mertel, RNCM   Olam Ku, RN, BSN [place]  [facility] RN Care Manager Direct Dial: [phone]  Fax: [phone]

## 2024-02-29 ENCOUNTER — Other Ambulatory Visit: Payer: Self-pay

## 2024-02-29 ENCOUNTER — Other Ambulatory Visit (HOSPITAL_COMMUNITY): Payer: Self-pay

## 2024-03-01 ENCOUNTER — Other Ambulatory Visit (HOSPITAL_COMMUNITY): Payer: Self-pay

## 2024-03-01 ENCOUNTER — Other Ambulatory Visit: Payer: Self-pay

## 2024-03-05 ENCOUNTER — Other Ambulatory Visit (HOSPITAL_COMMUNITY): Payer: Self-pay

## 2024-03-05 ENCOUNTER — Other Ambulatory Visit: Payer: Self-pay

## 2024-03-05 ENCOUNTER — Ambulatory Visit (HOSPITAL_COMMUNITY)
Admission: RE | Admit: 2024-03-05 | Discharge: 2024-03-05 | Disposition: A | Source: Ambulatory Visit | Attending: Cardiology

## 2024-03-05 DIAGNOSIS — G4733 Obstructive sleep apnea (adult) (pediatric): Secondary | ICD-10-CM | POA: Insufficient documentation

## 2024-03-05 DIAGNOSIS — I110 Hypertensive heart disease with heart failure: Secondary | ICD-10-CM | POA: Insufficient documentation

## 2024-03-05 DIAGNOSIS — Z86711 Personal history of pulmonary embolism: Secondary | ICD-10-CM | POA: Insufficient documentation

## 2024-03-05 DIAGNOSIS — E851 Neuropathic heredofamilial amyloidosis: Secondary | ICD-10-CM | POA: Insufficient documentation

## 2024-03-05 DIAGNOSIS — G63 Polyneuropathy in diseases classified elsewhere: Secondary | ICD-10-CM | POA: Insufficient documentation

## 2024-03-05 DIAGNOSIS — Z79899 Other long term (current) drug therapy: Secondary | ICD-10-CM | POA: Diagnosis not present

## 2024-03-05 DIAGNOSIS — I5032 Chronic diastolic (congestive) heart failure: Secondary | ICD-10-CM | POA: Diagnosis not present

## 2024-03-05 DIAGNOSIS — Z86718 Personal history of other venous thrombosis and embolism: Secondary | ICD-10-CM | POA: Diagnosis not present

## 2024-03-05 DIAGNOSIS — F17210 Nicotine dependence, cigarettes, uncomplicated: Secondary | ICD-10-CM | POA: Diagnosis not present

## 2024-03-05 MED ORDER — VUTRISIRAN SODIUM 25 MG/0.5ML ~~LOC~~ SOSY
25.0000 mg | PREFILLED_SYRINGE | Freq: Once | SUBCUTANEOUS | Status: AC
  Administered 2024-03-05: 25 mg via SUBCUTANEOUS

## 2024-03-05 NOTE — Addendum Note (Signed)
 Encounter addended by: Josue Tinnie HERO, RPH-CPP on: 03/05/2024 4:09 PM  Actions taken: Clinical Note Signed

## 2024-03-05 NOTE — Progress Notes (Cosign Needed Addendum)
 "   Advanced Heart Failure Clinic Note  PCP: Rosendo Rush, MD PCP-Cardiologist: Wilbert Bihari, MD  Neurology: Dr Tobie HF Cardiology: Dr. Cherrie  HPI:  Russell Collier is a 64 y.o. 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    Echo 01/2018, EF 55-60%, nonobstructive cardiac cath 01/2018 (20% prox LAD, 60% mid Cx, 50% post atrio lesion) h/o bradycardia w/ ? 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    PYP scan 09/2018 and was an equivocal study. No visual increase in PYP uptake, but ratio was between 1-1.5. Based clinical suspicion, he was ordered to undergo genetic testing and results + for TTR gene.   He was seen by neurology, neuropathy associated with TTR.    Admitted 07/27/19 with atypical chest pain and wheezing. Troponins were low and flat, EKG reassuring. A CTA was performed, which showed a possible segmental PE. Since he does have a history of PE, he was continued on IV heparin  and transitioned to Eliquis  on 07/28/19. Echo 07/27/19 EF 55% RV normal.    At his HF follow up 12/2019 Eliquis  was decreased due to frequent nose bleeds, mobility limited by back and knee pain.   S/p L3-S1 decompression 03/2020, uneventful hospitalization at [facility].   Graduated from paramedicine 09/2020.   Follow up with Neurology 03/2021, recommended starting Amvuttra  and recommended restarting PT.   Echo 04/2021 EF 50-55% RV ok    Admitted 5/18-22/23 for RLE cellulitis. S/p RLE angiography with CO2, no intervention. New wound on LLE, and underwent LLE angiography with balloon angioplasty of left peroneal artery. Had LLE wound which is now healed.    At HF follow up 01/2022 Imdur  discontinued and enalapril  decreased to  10 mg nightly due to orthostasis.   On 06/13/2023 he had a follow up HF clinic visit with APP. He reported feeling overall fine. He reported struggles with gait instability, no recent falls. Walked with a cane and was working with PT. He denied SOB walking on flat ground, denied SOB walking up 21 steps to get to his room. Said his legs were swelling some. Denied palpitations, abnormal bleeding, CP, or PND/Orthopnea. Appetite was ok. No fever or chills. He was not weighing at home. Reported taking all medications. Was not wearing CPAP, and reported smoking 3/4 pack of cigars/day, rare ETOH, no drugs. Was planning to get back to water aerobics after he finished PT.   On 08/06/2023 he presented to the ED with chest pain. He described the chest pain as brief, sharp/stabbing pain to the left chest, with pain lasting 1-2 seconds at a time. CXR was unremarkable. He started to feel better and was eventually discharged without admission.   Today he returns to HF clinic for Amvuttra  injection.  Overall feeling well. No contraindications to injection. Amvuttra  administered in left arm. Patient prefers left arm as he typically has significant injection site pain for 5-10 minutes after injection.     Assessment/Plan: 1. Familial TTR Cardiac Amyloidosis:  - PYP scan 09/2018 was equivocal. + genetic testing. Has TTR gene mutation, heterozygous.  - He is on tafamadis and referred for family genetic testing. No family members with TTR.  - Based on PYP scan not thought to have extensive cardiac involvement at this point.  -  He has numerous neuropathic symptoms. Followed by Dr. Tonita Blanch in Neurology for neuropathy - Continue Tafamidis . 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 Patient noted no significant pain at the injection site after injection today. Provided patient counseling on Amvuttra . Most common side effects are injection site reactions,  arthralgias, dyspnea and vitamin A  deficiency. Patient is aware to return to clinic every 3 months for repeat injection.   - Continue vitamin A  supplement. Amvuttra  decreases serum vitamin A  levels.   Follow up 3 months for repeat Amvuttra  injection.   Tinnie Redman, PharmD, BCPS, BCCP, CPP Heart Failure Clinic Pharmacist [phone] "

## 2024-03-05 NOTE — Progress Notes (Signed)
 Specialty Pharmacy Refill Coordination Note  Russell Collier is a 64 y.o. male contacted today regarding refills of specialty medication(s) Tafamidis  (Vyndamax )   Patient requested Delivery   Delivery date: 03/12/24   Verified address: [address]   [place] [address]   Medication will be filled on: 03/09/24

## 2024-03-06 MED ORDER — FUROSEMIDE 40 MG PO TABS: ORAL_TABLET | ORAL | 3 refills | Status: AC

## 2024-03-06 NOTE — Progress Notes (Unsigned)
" ° °  SUBJECTIVE:   CHIEF COMPLAINT / HPI:  Russell Collier is a 64 y.o. male with a pertinent past medical history of  multiple PEs on Eliquis , CHF, CAD, COPD, OSA, T2DM, and HTN presenting to the clinic for referrals discussion and gout flare  Referrals Patient reports that his insurance has changed and he needs new referrals for all of his specialist follow ups: - Ortho: s/p lumbar fusion with Russell Collier of [facility] > screws now coming loose in spine. - Plastic Surgery: s/p alar cyst removal with Dr. Elisabeth of [facility] > nasal cyst returning. - PT: Feels unsteady on his legs, needs a a cane.  Gout Patient has a longstanding history of gout. Feels the start of gout in his right great toe. Taking allopurinol  100 mg daily. Requesting a colchicine  0.6 mg course. Patient is avoiding seafood and shellfish (in particular shrimp and mussels). Does not drink alcohol.   PERTINENT PMH / PSH: - Multiple pulmonary embolisms (possible small PE 2021, 2019, prior to 2017) - Diastolic CHF - CAD, unstable angina - Hx laryngeal spasm - Hx OSA and COPD  *Remainder reviewed in problem list.   OBJECTIVE:   BP 120/75   Pulse 89   Ht 5' 10.5 (1.791 m)   Wt 262 lb 12.8 oz (119.2 kg)   SpO2 97%   BMI 37.17 kg/m   General: Age-appropriate, resting comfortably in chair, NAD, alert and at baseline. HEENT: Normocephalic, atraumatic. PERRLA. Left alar nasal cystic lesion. Clear, no tonsillar exudate. MMM. Skin: Warm and dry. Extremities: No peripheral edema bilaterally. Mild erythema and tenderness to palpation of R great toe at [facility] and metatarsophalangeal joint.  Capillary refill <2 seconds. MSK: Ambulates with cane.  Slow, mildly antalgic gait.   ASSESSMENT/PLAN:   Assessment & Plan Acute gout involving toe of right foot, unspecified cause Acute pain of right foot Longstanding issue of gout, presentation consistent with gout today.  Likely at the beginning stages of flare. -  Continue allopurinol  100 mg daily - Colchicine  0.6 mg daily x 10 day course Status post lumbar spinal fusion Reporting loosening of spinal screws, requires referral to see his spinal surgeon.  Status post lumbar fusion by Dr. Norleen Collier with [facility]. - Amb ref Orthopedic Surgery Russell Collier Gait instability Patient has been getting outpatient PT, requires repeat referral.  Ambulates with cane and has gait instability, would benefit from strengthening and further therapy. - Referral to outpatient PT External nasal lesion Cystic lesion on L alar area, prior excision by Dr. Elisabeth with [facility], patient desires follow up for recurrence. - Amb ref Plastic Surgery Colon cancer screening Previously planned and presurgical clearance completed on 01/31/2024, but now needs referral again. - Amb ref GI ([facility])  Follow up in 6 months or sooner if needed.  Russell Wedin Toma, MD [facility] "

## 2024-03-07 ENCOUNTER — Ambulatory Visit (INDEPENDENT_AMBULATORY_CARE_PROVIDER_SITE_OTHER): Payer: Self-pay | Admitting: Family Medicine

## 2024-03-07 ENCOUNTER — Encounter: Payer: Self-pay | Admitting: Family Medicine

## 2024-03-07 VITALS — BP 120/75 | HR 89 | Ht 70.5 in | Wt 262.8 lb

## 2024-03-07 DIAGNOSIS — L989 Disorder of the skin and subcutaneous tissue, unspecified: Secondary | ICD-10-CM

## 2024-03-07 DIAGNOSIS — Z1211 Encounter for screening for malignant neoplasm of colon: Secondary | ICD-10-CM | POA: Diagnosis not present

## 2024-03-07 DIAGNOSIS — M79671 Pain in right foot: Secondary | ICD-10-CM | POA: Diagnosis not present

## 2024-03-07 DIAGNOSIS — R2681 Unsteadiness on feet: Secondary | ICD-10-CM | POA: Diagnosis not present

## 2024-03-07 DIAGNOSIS — Z981 Arthrodesis status: Secondary | ICD-10-CM | POA: Diagnosis not present

## 2024-03-07 DIAGNOSIS — M109 Gout, unspecified: Secondary | ICD-10-CM

## 2024-03-07 MED ORDER — COLCHICINE 0.6 MG PO TABS: 0.6000 mg | ORAL_TABLET | Freq: Every day | ORAL | 0 refills | Status: AC

## 2024-03-07 NOTE — Assessment & Plan Note (Signed)
 Longstanding issue of gout, presentation consistent with gout today.  Likely at the beginning stages of flare. - Continue allopurinol  100 mg daily - Colchicine  0.6 mg daily x 10 day course

## 2024-03-07 NOTE — Patient Instructions (Signed)
 It was great to see you today! Thank you for choosing [facility] for your primary care.  Today we addressed: I have placed the referrals you requested. Please go ahead and call [facility] to schedule your Colonoscopy.  Gout I have sent in colchicine  for your gout.  You should return to our clinic in 6 months.  Thank you for coming to see us  at [facility] and for the opportunity to care for you! Tayjon Halladay, MD 03/07/2024, 9:56 AM

## 2024-03-07 NOTE — Assessment & Plan Note (Signed)
 Reporting loosening of spinal screws, requires referral to see his spinal surgeon.  Status post lumbar fusion by Dr. Norleen Capri with [facility]. - Amb ref Orthopedic Surgery Dr. Capri

## 2024-03-08 ENCOUNTER — Telehealth: Admitting: *Deleted

## 2024-03-08 ENCOUNTER — Other Ambulatory Visit: Payer: Self-pay
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
## 2024-03-08 NOTE — Patient Instructions (Signed)
 Visit Information  Thank you for taking time to visit with me today. Please don't hesitate to contact me if I can be of assistance to you before our next scheduled appointment.  Your next care management appointment is by telephone on 04/05/24 at 10:30am  Telephone follow-up in 1 month  Please call the care guide team at [phone] if you need to cancel, schedule, or reschedule an appointment.   Please call the Suicide and Crisis Lifeline: 988 call the [place]  National Suicide Prevention Lifeline: 903-350-5404 or TTY: 639-176-3391 TTY 220-125-7464) to talk to a trained counselor call 1-800-273-TALK (toll free, 24 hour hotline) if you are experiencing a Mental Health or Behavioral Health Crisis or need someone to talk to.  Jomarie Gellis RN RN Care Manager [facility] [phone]

## 2024-03-09 ENCOUNTER — Other Ambulatory Visit: Payer: Self-pay

## 2024-03-09 MED ORDER — VALACYCLOVIR HCL 500 MG PO TABS: 500.0000 mg | ORAL_TABLET | Freq: Every day | ORAL | 3 refills | Status: AC

## 2024-03-12 ENCOUNTER — Other Ambulatory Visit: Payer: Self-pay

## 2024-03-13 ENCOUNTER — Ambulatory Visit

## 2024-03-13 MED ORDER — VENTOLIN HFA 108 (90 BASE) MCG/ACT IN AERS: 2.0000 | INHALATION_SPRAY | Freq: Four times a day (QID) | RESPIRATORY_TRACT | 3 refills | Status: AC | PRN

## 2024-03-20 ENCOUNTER — Ambulatory Visit

## 2024-03-20 ENCOUNTER — Other Ambulatory Visit: Payer: Self-pay

## 2024-03-20 DIAGNOSIS — R2689 Other abnormalities of gait and mobility: Secondary | ICD-10-CM | POA: Insufficient documentation

## 2024-03-20 DIAGNOSIS — Z981 Arthrodesis status: Secondary | ICD-10-CM | POA: Diagnosis not present

## 2024-03-20 DIAGNOSIS — R2681 Unsteadiness on feet: Secondary | ICD-10-CM | POA: Insufficient documentation

## 2024-03-20 DIAGNOSIS — M5459 Other low back pain: Secondary | ICD-10-CM | POA: Insufficient documentation
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
## 2024-03-22 ENCOUNTER — Ambulatory Visit (INDEPENDENT_AMBULATORY_CARE_PROVIDER_SITE_OTHER): Admitting: Podiatry

## 2024-03-22 ENCOUNTER — Encounter: Payer: Self-pay | Admitting: Podiatry

## 2024-03-22 DIAGNOSIS — M79674 Pain in right toe(s): Secondary | ICD-10-CM | POA: Diagnosis not present

## 2024-03-22 DIAGNOSIS — E1151 Type 2 diabetes mellitus with diabetic peripheral angiopathy without gangrene: Secondary | ICD-10-CM | POA: Diagnosis not present

## 2024-03-22 DIAGNOSIS — M79675 Pain in left toe(s): Secondary | ICD-10-CM | POA: Diagnosis not present

## 2024-03-22 DIAGNOSIS — Z0189 Encounter for other specified special examinations: Secondary | ICD-10-CM

## 2024-03-22 DIAGNOSIS — I739 Peripheral vascular disease, unspecified: Secondary | ICD-10-CM | POA: Diagnosis not present

## 2024-03-22 DIAGNOSIS — B351 Tinea unguium: Secondary | ICD-10-CM

## 2024-03-22 DIAGNOSIS — E119 Type 2 diabetes mellitus without complications: Secondary | ICD-10-CM
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
## 2024-03-30 ENCOUNTER — Other Ambulatory Visit: Payer: Self-pay

## 2024-04-02 ENCOUNTER — Telehealth

## 2024-04-03 ENCOUNTER — Ambulatory Visit

## 2024-04-05 ENCOUNTER — Telehealth: Admitting: *Deleted

## 2024-04-05 ENCOUNTER — Telehealth

## 2024-04-05 ENCOUNTER — Ambulatory Visit

## 2024-04-10 ENCOUNTER — Ambulatory Visit

## 2024-04-12 ENCOUNTER — Ambulatory Visit

## 2024-04-17 ENCOUNTER — Ambulatory Visit

## 2024-04-19 ENCOUNTER — Ambulatory Visit

## 2024-04-24 ENCOUNTER — Ambulatory Visit

## 2024-04-26 ENCOUNTER — Ambulatory Visit

## 2024-05-28 ENCOUNTER — Ambulatory Visit (INDEPENDENT_AMBULATORY_CARE_PROVIDER_SITE_OTHER): Admitting: Physician Assistant

## 2024-06-05 ENCOUNTER — Ambulatory Visit (HOSPITAL_COMMUNITY)

## 2024-07-05 ENCOUNTER — Ambulatory Visit: Admitting: Podiatry

## 2024-08-08 ENCOUNTER — Ambulatory Visit

## 2024-08-08 ENCOUNTER — Ambulatory Visit (HOSPITAL_COMMUNITY)
